# Patient Record
Sex: Female | Born: 1970
Health system: Southern US, Community
[De-identification: ages and names within clinical notes are randomized; demographics above are authoritative.]

## PROBLEM LIST (undated history)

## (undated) DIAGNOSIS — E119 Type 2 diabetes mellitus without complications: Secondary | ICD-10-CM

## (undated) DIAGNOSIS — K219 Gastro-esophageal reflux disease without esophagitis: Secondary | ICD-10-CM

## (undated) DIAGNOSIS — E1142 Type 2 diabetes mellitus with diabetic polyneuropathy: Secondary | ICD-10-CM

## (undated) DIAGNOSIS — I10 Essential (primary) hypertension: Secondary | ICD-10-CM

## (undated) DIAGNOSIS — F329 Major depressive disorder, single episode, unspecified: Secondary | ICD-10-CM

## (undated) DIAGNOSIS — F32A Depression, unspecified: Secondary | ICD-10-CM

## (undated) DIAGNOSIS — E669 Obesity, unspecified: Secondary | ICD-10-CM

## (undated) DIAGNOSIS — R000 Tachycardia, unspecified: Secondary | ICD-10-CM

## (undated) DIAGNOSIS — E7800 Pure hypercholesterolemia, unspecified: Secondary | ICD-10-CM

## (undated) DIAGNOSIS — F609 Personality disorder, unspecified: Secondary | ICD-10-CM

## (undated) DIAGNOSIS — R45851 Suicidal ideations: Secondary | ICD-10-CM

---

## 2003-06-20 ENCOUNTER — Emergency Department (HOSPITAL_COMMUNITY): Admission: EM | Admit: 2003-06-20 | Discharge: 2003-06-20 | Payer: Self-pay | Admitting: Emergency Medicine

## 2004-04-03 ENCOUNTER — Inpatient Hospital Stay (HOSPITAL_COMMUNITY): Admission: EM | Admit: 2004-04-03 | Discharge: 2004-04-05 | Payer: Self-pay | Admitting: Emergency Medicine

## 2004-04-05 ENCOUNTER — Inpatient Hospital Stay (HOSPITAL_COMMUNITY): Admission: RE | Admit: 2004-04-05 | Discharge: 2004-04-09 | Payer: Self-pay | Admitting: Psychiatry

## 2004-04-10 ENCOUNTER — Other Ambulatory Visit (HOSPITAL_COMMUNITY): Admission: RE | Admit: 2004-04-10 | Discharge: 2004-04-10 | Payer: Self-pay | Admitting: Psychiatry

## 2004-09-28 ENCOUNTER — Ambulatory Visit: Payer: Self-pay | Admitting: Internal Medicine

## 2004-10-10 ENCOUNTER — Ambulatory Visit: Payer: Self-pay | Admitting: Unknown Physician Specialty

## 2005-02-07 ENCOUNTER — Emergency Department (HOSPITAL_COMMUNITY): Admission: EM | Admit: 2005-02-07 | Discharge: 2005-02-07 | Payer: Self-pay | Admitting: Emergency Medicine

## 2005-05-31 ENCOUNTER — Emergency Department: Payer: Self-pay | Admitting: Emergency Medicine

## 2005-06-21 ENCOUNTER — Emergency Department (HOSPITAL_COMMUNITY): Admission: EM | Admit: 2005-06-21 | Discharge: 2005-06-22 | Payer: Self-pay | Admitting: Emergency Medicine

## 2005-07-13 ENCOUNTER — Inpatient Hospital Stay: Payer: Self-pay | Admitting: Unknown Physician Specialty

## 2006-07-28 ENCOUNTER — Ambulatory Visit: Payer: Self-pay | Admitting: Nurse Practitioner

## 2006-08-07 ENCOUNTER — Ambulatory Visit: Payer: Self-pay | Admitting: Nurse Practitioner

## 2006-09-01 ENCOUNTER — Ambulatory Visit (HOSPITAL_COMMUNITY): Admission: RE | Admit: 2006-09-01 | Discharge: 2006-09-01 | Payer: Self-pay | Admitting: Family Medicine

## 2006-12-30 ENCOUNTER — Ambulatory Visit: Payer: Self-pay | Admitting: Gastroenterology

## 2007-05-12 ENCOUNTER — Inpatient Hospital Stay: Payer: Self-pay | Admitting: Unknown Physician Specialty

## 2007-05-15 ENCOUNTER — Other Ambulatory Visit: Payer: Self-pay

## 2007-09-09 DIAGNOSIS — I10 Essential (primary) hypertension: Secondary | ICD-10-CM | POA: Insufficient documentation

## 2007-09-09 DIAGNOSIS — F3289 Other specified depressive episodes: Secondary | ICD-10-CM | POA: Insufficient documentation

## 2007-09-09 DIAGNOSIS — F329 Major depressive disorder, single episode, unspecified: Secondary | ICD-10-CM | POA: Insufficient documentation

## 2007-09-09 DIAGNOSIS — K219 Gastro-esophageal reflux disease without esophagitis: Secondary | ICD-10-CM | POA: Insufficient documentation

## 2007-09-09 DIAGNOSIS — G609 Hereditary and idiopathic neuropathy, unspecified: Secondary | ICD-10-CM | POA: Insufficient documentation

## 2007-09-09 DIAGNOSIS — E119 Type 2 diabetes mellitus without complications: Secondary | ICD-10-CM | POA: Insufficient documentation

## 2007-09-25 ENCOUNTER — Ambulatory Visit: Payer: Self-pay | Admitting: Family Medicine

## 2007-11-26 ENCOUNTER — Ambulatory Visit: Payer: Self-pay | Admitting: Pain Medicine

## 2008-02-26 ENCOUNTER — Inpatient Hospital Stay: Payer: Self-pay | Admitting: Unknown Physician Specialty

## 2008-04-06 ENCOUNTER — Other Ambulatory Visit: Payer: Self-pay

## 2008-04-06 ENCOUNTER — Inpatient Hospital Stay: Payer: Self-pay | Admitting: Unknown Physician Specialty

## 2008-05-03 ENCOUNTER — Ambulatory Visit: Payer: Self-pay | Admitting: Unknown Physician Specialty

## 2008-05-11 ENCOUNTER — Ambulatory Visit: Payer: Self-pay | Admitting: Unknown Physician Specialty

## 2008-06-13 ENCOUNTER — Ambulatory Visit: Payer: Self-pay | Admitting: Unknown Physician Specialty

## 2008-07-12 ENCOUNTER — Ambulatory Visit: Payer: Self-pay | Admitting: Unknown Physician Specialty

## 2008-08-11 ENCOUNTER — Ambulatory Visit: Payer: Self-pay | Admitting: Unknown Physician Specialty

## 2008-09-11 ENCOUNTER — Ambulatory Visit: Payer: Self-pay | Admitting: Unknown Physician Specialty

## 2008-10-11 ENCOUNTER — Ambulatory Visit: Payer: Self-pay | Admitting: Unknown Physician Specialty

## 2008-11-11 ENCOUNTER — Ambulatory Visit: Payer: Self-pay | Admitting: Unknown Physician Specialty

## 2008-11-15 ENCOUNTER — Ambulatory Visit: Payer: Self-pay | Admitting: Family Medicine

## 2008-11-25 ENCOUNTER — Ambulatory Visit: Payer: Self-pay | Admitting: Unknown Physician Specialty

## 2008-12-12 ENCOUNTER — Ambulatory Visit: Payer: Self-pay | Admitting: Unknown Physician Specialty

## 2008-12-12 ENCOUNTER — Ambulatory Visit: Payer: Self-pay | Admitting: Family Medicine

## 2009-02-17 ENCOUNTER — Ambulatory Visit: Payer: Self-pay | Admitting: Unknown Physician Specialty

## 2009-03-09 ENCOUNTER — Inpatient Hospital Stay: Payer: Self-pay | Admitting: Psychiatry

## 2009-03-14 ENCOUNTER — Ambulatory Visit: Payer: Self-pay | Admitting: Unknown Physician Specialty

## 2009-04-11 ENCOUNTER — Ambulatory Visit: Payer: Self-pay | Admitting: Family Medicine

## 2009-04-21 ENCOUNTER — Ambulatory Visit: Payer: Self-pay | Admitting: Family Medicine

## 2009-05-11 ENCOUNTER — Ambulatory Visit: Payer: Self-pay | Admitting: Unknown Physician Specialty

## 2009-07-12 ENCOUNTER — Ambulatory Visit: Payer: Self-pay | Admitting: Unknown Physician Specialty

## 2009-09-30 ENCOUNTER — Emergency Department: Payer: Self-pay | Admitting: Emergency Medicine

## 2009-10-11 ENCOUNTER — Ambulatory Visit: Payer: Self-pay | Admitting: Unknown Physician Specialty

## 2009-12-22 ENCOUNTER — Ambulatory Visit: Payer: Self-pay | Admitting: Unknown Physician Specialty

## 2010-01-08 ENCOUNTER — Inpatient Hospital Stay: Payer: Self-pay | Admitting: Unknown Physician Specialty

## 2010-01-19 ENCOUNTER — Ambulatory Visit: Payer: Self-pay | Admitting: Unknown Physician Specialty

## 2010-02-09 ENCOUNTER — Ambulatory Visit: Payer: Self-pay | Admitting: Unknown Physician Specialty

## 2010-03-13 ENCOUNTER — Ambulatory Visit: Payer: Self-pay | Admitting: Unknown Physician Specialty

## 2010-04-11 ENCOUNTER — Ambulatory Visit: Payer: Self-pay | Admitting: Unknown Physician Specialty

## 2010-06-11 ENCOUNTER — Ambulatory Visit: Payer: Self-pay | Admitting: Unknown Physician Specialty

## 2010-07-12 ENCOUNTER — Ambulatory Visit: Payer: Self-pay | Admitting: Unknown Physician Specialty

## 2010-10-08 ENCOUNTER — Ambulatory Visit: Payer: Self-pay | Admitting: Unknown Physician Specialty

## 2010-10-11 ENCOUNTER — Ambulatory Visit: Payer: Self-pay | Admitting: Unknown Physician Specialty

## 2010-12-12 ENCOUNTER — Ambulatory Visit: Payer: Self-pay | Admitting: Unknown Physician Specialty

## 2011-03-12 ENCOUNTER — Ambulatory Visit: Payer: Self-pay | Admitting: Unknown Physician Specialty

## 2011-03-17 ENCOUNTER — Emergency Department: Payer: Self-pay | Admitting: Emergency Medicine

## 2011-06-12 ENCOUNTER — Ambulatory Visit: Payer: Self-pay | Admitting: Unknown Physician Specialty

## 2011-08-12 ENCOUNTER — Ambulatory Visit: Payer: Self-pay | Admitting: Unknown Physician Specialty

## 2011-09-12 ENCOUNTER — Ambulatory Visit: Payer: Self-pay | Admitting: Unknown Physician Specialty

## 2011-10-14 ENCOUNTER — Ambulatory Visit: Payer: Self-pay | Admitting: Unknown Physician Specialty

## 2011-11-13 ENCOUNTER — Ambulatory Visit: Payer: Self-pay | Admitting: Unknown Physician Specialty

## 2011-12-13 ENCOUNTER — Ambulatory Visit: Payer: Self-pay | Admitting: Unknown Physician Specialty

## 2012-01-10 ENCOUNTER — Ambulatory Visit: Payer: Self-pay | Admitting: Unknown Physician Specialty

## 2012-02-10 ENCOUNTER — Ambulatory Visit: Payer: Self-pay | Admitting: Unknown Physician Specialty

## 2012-03-11 ENCOUNTER — Ambulatory Visit: Payer: Self-pay | Admitting: Unknown Physician Specialty

## 2012-04-11 ENCOUNTER — Ambulatory Visit: Payer: Self-pay | Admitting: Unknown Physician Specialty

## 2012-05-11 ENCOUNTER — Ambulatory Visit: Payer: Self-pay | Admitting: Psychiatry

## 2012-06-11 ENCOUNTER — Ambulatory Visit: Payer: Self-pay | Admitting: Psychiatry

## 2012-07-15 ENCOUNTER — Ambulatory Visit: Payer: Self-pay | Admitting: Unknown Physician Specialty

## 2012-08-11 ENCOUNTER — Ambulatory Visit: Payer: Self-pay | Admitting: Psychiatry

## 2012-09-11 ENCOUNTER — Ambulatory Visit: Payer: Self-pay | Admitting: Psychiatry

## 2012-10-11 ENCOUNTER — Ambulatory Visit: Payer: Self-pay | Admitting: Psychiatry

## 2012-11-11 ENCOUNTER — Ambulatory Visit: Payer: Self-pay | Admitting: Psychiatry

## 2012-12-14 ENCOUNTER — Ambulatory Visit: Payer: Self-pay | Admitting: Psychiatry

## 2013-01-10 ENCOUNTER — Ambulatory Visit: Payer: Self-pay | Admitting: Psychiatry

## 2013-02-10 ENCOUNTER — Ambulatory Visit: Payer: Self-pay | Admitting: Psychiatry

## 2013-03-11 ENCOUNTER — Ambulatory Visit: Payer: Self-pay | Admitting: Psychiatry

## 2013-04-11 ENCOUNTER — Ambulatory Visit: Payer: Self-pay | Admitting: Psychiatry

## 2013-05-07 ENCOUNTER — Inpatient Hospital Stay: Payer: Self-pay | Admitting: Psychiatry

## 2013-05-08 LAB — BEHAVIORAL MEDICINE 1 PANEL
Albumin: 3.2 g/dL — ABNORMAL LOW (ref 3.4–5.0)
Alkaline Phosphatase: 97 U/L (ref 50–136)
Anion Gap: 3 — ABNORMAL LOW (ref 7–16)
BUN: 7 mg/dL (ref 7–18)
Basophil #: 0 10*3/uL (ref 0.0–0.1)
Basophil %: 0.4 %
Bilirubin,Total: 0.3 mg/dL (ref 0.2–1.0)
Calcium, Total: 8.8 mg/dL (ref 8.5–10.1)
Chloride: 108 mmol/L — ABNORMAL HIGH (ref 98–107)
Co2: 28 mmol/L (ref 21–32)
Creatinine: 0.77 mg/dL (ref 0.60–1.30)
EGFR (African American): >60
EGFR (Non-African Amer.): >60
Eosinophil #: 0.1 10*3/uL (ref 0.0–0.7)
Eosinophil %: 1 %
Glucose: 102 mg/dL — ABNORMAL HIGH (ref 65–99)
HCT: 34.5 % — ABNORMAL LOW (ref 35.0–47.0)
HGB: 11.3 g/dL — ABNORMAL LOW (ref 12.0–16.0)
Lymphocyte #: 2.9 10*3/uL (ref 1.0–3.6)
Lymphocyte %: 36.2 %
MCH: 26.2 pg (ref 26.0–34.0)
MCHC: 32.6 g/dL (ref 32.0–36.0)
MCV: 80 fL (ref 80–100)
Monocyte #: 0.5 x10 3/mm (ref 0.2–0.9)
Monocyte %: 6.9 %
Neutrophil #: 4.4 10*3/uL (ref 1.4–6.5)
Neutrophil %: 55.5 %
Osmolality: 276 (ref 275–301)
Platelet: 271 10*3/uL (ref 150–440)
Potassium: 3.8 mmol/L (ref 3.5–5.1)
RBC: 4.29 10*6/uL (ref 3.80–5.20)
RDW: 14.2 % (ref 11.5–14.5)
SGOT(AST): 14 U/L — ABNORMAL LOW (ref 15–37)
SGPT (ALT): 14 U/L (ref 12–78)
Sodium: 139 mmol/L (ref 136–145)
Thyroid Stimulating Horm: 2.15 u[IU]/mL
Total Protein: 6.7 g/dL (ref 6.4–8.2)
WBC: 7.9 10*3/uL (ref 3.6–11.0)

## 2013-05-08 LAB — URINALYSIS, COMPLETE
Bilirubin,UR: NEGATIVE
Blood: NEGATIVE
Glucose,UR: NEGATIVE mg/dL (ref 0–75)
Ketone: NEGATIVE
Nitrite: NEGATIVE
Ph: 6 (ref 4.5–8.0)
Protein: NEGATIVE
RBC,UR: <1 /HPF (ref 0–5)
Specific Gravity: 1.006 (ref 1.003–1.030)
Squamous Epithelial: 4
WBC UR: 1 /HPF (ref 0–5)

## 2013-05-11 ENCOUNTER — Ambulatory Visit: Payer: Self-pay | Admitting: Psychiatry

## 2013-06-11 ENCOUNTER — Ambulatory Visit: Payer: Self-pay | Admitting: Psychiatry

## 2013-07-12 ENCOUNTER — Ambulatory Visit: Payer: Self-pay | Admitting: Psychiatry

## 2013-08-11 ENCOUNTER — Ambulatory Visit: Payer: Self-pay | Admitting: Psychiatry

## 2013-09-11 ENCOUNTER — Ambulatory Visit: Payer: Self-pay | Admitting: Psychiatry

## 2013-09-12 ENCOUNTER — Ambulatory Visit: Payer: Self-pay | Admitting: Psychiatry

## 2013-10-11 ENCOUNTER — Ambulatory Visit: Payer: Self-pay | Admitting: Psychiatry

## 2013-11-11 ENCOUNTER — Ambulatory Visit: Payer: Self-pay | Admitting: Psychiatry

## 2013-12-13 ENCOUNTER — Ambulatory Visit: Payer: Self-pay | Admitting: Psychiatry

## 2014-01-09 ENCOUNTER — Ambulatory Visit: Payer: Self-pay | Admitting: Psychiatry

## 2014-02-09 ENCOUNTER — Ambulatory Visit: Payer: Self-pay | Admitting: Psychiatry

## 2014-03-11 ENCOUNTER — Ambulatory Visit: Payer: Self-pay | Admitting: Psychiatry

## 2014-04-11 ENCOUNTER — Ambulatory Visit: Payer: Self-pay | Admitting: Psychiatry

## 2014-05-12 ENCOUNTER — Ambulatory Visit: Payer: Self-pay | Admitting: Psychiatry

## 2014-06-11 ENCOUNTER — Ambulatory Visit: Payer: Self-pay | Admitting: Psychiatry

## 2014-07-12 ENCOUNTER — Ambulatory Visit: Payer: Self-pay | Admitting: Psychiatry

## 2014-08-12 ENCOUNTER — Ambulatory Visit: Payer: Self-pay | Admitting: Psychiatry

## 2014-09-12 ENCOUNTER — Ambulatory Visit: Payer: Self-pay | Admitting: Psychiatry

## 2014-10-11 ENCOUNTER — Ambulatory Visit: Payer: Self-pay | Admitting: Psychiatry

## 2014-11-14 ENCOUNTER — Ambulatory Visit: Payer: Self-pay | Admitting: Psychiatry

## 2014-11-18 DIAGNOSIS — F329 Major depressive disorder, single episode, unspecified: Secondary | ICD-10-CM | POA: Diagnosis not present

## 2014-11-25 DIAGNOSIS — F329 Major depressive disorder, single episode, unspecified: Secondary | ICD-10-CM | POA: Diagnosis not present

## 2014-11-30 DIAGNOSIS — F329 Major depressive disorder, single episode, unspecified: Secondary | ICD-10-CM | POA: Diagnosis not present

## 2014-12-08 ENCOUNTER — Ambulatory Visit: Payer: Self-pay | Admitting: Psychiatry

## 2014-12-09 DIAGNOSIS — F329 Major depressive disorder, single episode, unspecified: Secondary | ICD-10-CM | POA: Diagnosis not present

## 2014-12-12 ENCOUNTER — Ambulatory Visit: Payer: Self-pay | Admitting: Psychiatry

## 2014-12-16 DIAGNOSIS — F329 Major depressive disorder, single episode, unspecified: Secondary | ICD-10-CM | POA: Diagnosis not present

## 2014-12-23 DIAGNOSIS — F329 Major depressive disorder, single episode, unspecified: Secondary | ICD-10-CM | POA: Diagnosis not present

## 2014-12-30 DIAGNOSIS — F329 Major depressive disorder, single episode, unspecified: Secondary | ICD-10-CM | POA: Diagnosis not present

## 2015-01-06 DIAGNOSIS — F329 Major depressive disorder, single episode, unspecified: Secondary | ICD-10-CM | POA: Diagnosis not present

## 2015-01-10 ENCOUNTER — Ambulatory Visit
Admit: 2015-01-10 | Disposition: A | Discharge status: Home / Self Care | Payer: Self-pay | Attending: Psychiatry | Admitting: Psychiatry

## 2015-01-13 DIAGNOSIS — F339 Major depressive disorder, recurrent, unspecified: Secondary | ICD-10-CM | POA: Diagnosis not present

## 2015-01-20 DIAGNOSIS — F339 Major depressive disorder, recurrent, unspecified: Secondary | ICD-10-CM | POA: Diagnosis not present

## 2015-01-27 DIAGNOSIS — F339 Major depressive disorder, recurrent, unspecified: Secondary | ICD-10-CM | POA: Diagnosis not present

## 2015-02-03 DIAGNOSIS — F339 Major depressive disorder, recurrent, unspecified: Secondary | ICD-10-CM | POA: Diagnosis not present

## 2015-02-10 ENCOUNTER — Ambulatory Visit
Admit: 2015-02-10 | Disposition: A | Discharge status: Home / Self Care | Payer: Self-pay | Attending: Psychiatry | Admitting: Psychiatry

## 2015-02-10 DIAGNOSIS — F329 Major depressive disorder, single episode, unspecified: Secondary | ICD-10-CM | POA: Diagnosis not present

## 2015-02-10 HISTORY — DX: Essential (primary) hypertension: I10

## 2015-02-10 HISTORY — DX: Suicidal ideations: R45.851

## 2015-02-10 HISTORY — DX: Obesity, unspecified: E66.9

## 2015-02-10 HISTORY — DX: Type 2 diabetes mellitus without complications: E11.9

## 2015-02-10 HISTORY — DX: Tachycardia, unspecified: R00.0

## 2015-02-10 HISTORY — DX: Depression, unspecified: F32.A

## 2015-02-10 HISTORY — DX: Type 2 diabetes mellitus with diabetic polyneuropathy: E11.42

## 2015-02-10 HISTORY — DX: Gastro-esophageal reflux disease without esophagitis: K21.9

## 2015-02-10 HISTORY — DX: Personality disorder, unspecified: F60.9

## 2015-02-10 HISTORY — DX: Pure hypercholesterolemia, unspecified: E78.00

## 2015-02-10 HISTORY — DX: Major depressive disorder, single episode, unspecified: F32.9

## 2015-02-17 DIAGNOSIS — F329 Major depressive disorder, single episode, unspecified: Secondary | ICD-10-CM | POA: Diagnosis not present

## 2015-02-24 DIAGNOSIS — F329 Major depressive disorder, single episode, unspecified: Secondary | ICD-10-CM | POA: Diagnosis not present

## 2015-03-03 DIAGNOSIS — F329 Major depressive disorder, single episode, unspecified: Secondary | ICD-10-CM | POA: Diagnosis not present

## 2015-03-03 NOTE — Discharge Summary (Signed)
PATIENT NAME:  Melinda [name], Melinda [name] MR#:  [id number] DATE OF BIRTH:  [birth year]  DATE OF ADMISSION:  05/07/2013 DATE OF DISCHARGE:  05/10/2013  HOSPITAL COURSE:  See dictated history and physical for details of admission.  This 44 year old woman with a history of chronic recurrent depression was admitted to the hospital on Friday after ECT.  She was showing symptoms of dramatic worsening of her depression.  She was having active suicidal thoughts and thoughts about cutting herself.  The patient was not able to contract for safety.  She was voluntarily admitted to the hospital.  In the hospital, she was cooperative with treatment.  She was continued on her usual medication.  She did not display any dangerous behavior, did not show any suicidal behavior.  Did not injure herself.  At the time of discharge we did another ECT treatment on Monday and the patient reported that her mood was feeling much better.  Although she remains chronically depressed and anxious, she is not actively suicidal and she agrees to outpatient treatment.  She was discharged with the plan that she will return within a week for another ECT treatment and meanwhile continue her usual medication as prescribed by her outpatient psychiatrist.   DISCHARGE MEDICATIONS:  Neurontin 800 mg 3 times a day, Wellbutrin XL 300 mg a day, glipizide 10 mg twice a day, Actos 30 mg once a day, Januvia 100 mg a day, atorvastatin 10 mg once a day, ziprasidone 80 mg twice a day and Viibryd 40 mg per day.   LABORATORY RESULTS:  Chemistries done early in her admission showed her CBC with a slight anemia with a hemoglobin of 11.3.  Chemistries with the glucose slightly elevated at 102, chloride 108, AST low at 14.  No other remarkable findings.   MENTAL STATUS EXAMINATION AT DISCHARGE:  Neatly dressed and groomed woman, looks her stated age.  Cooperative with the interview.  Good eye contact, normal psychomotor activity.  Speech normal rate, tone and volume.   Affect euthymic, reactive, appropriate.  Mood stated as being better.  Thoughts appear to be lucid.  No evidence of loosening of associations or delusions.  Denies auditory or visual hallucinations.  Denies suicidal or homicidal ideation.  Shows improved judgment and insight.  Normal intelligence.   DISPOSITION:  Discharge home.  Follow up with her outpatient psychiatrist and ECT.   DISCHARGE DIAGNOSIS, PRINCIPAL AND PRIMARY:  AXIS I:  Major depression, severe, recurrent.   SECONDARY DIAGNOSES: AXIS I:  No further.  AXIS II:  Deferred.  AXIS III:  Diabetes, obesity.  AXIS IV:  Severe from chronic impairment from her illness, lack of support.  AXIS V:  Functioning at time of discharge 84.    ____________________________ Gonzella Lex, MD jtc:ea D: 05/12/2013 16:34:42 ET T: 05/12/2013 23:36:05 ET JOB#: [id number]  cc: Gonzella Lex, MD, <Dictator> Gonzella Lex MD ELECTRONICALLY SIGNED 05/13/2013 10:47

## 2015-03-03 NOTE — H&P (Signed)
PATIENT NAME:  Melinda [name], Melinda [name] MR#:  [id number] DATE OF BIRTH:  Apr 27, 1971  DATE OF ADMISSION:  05/07/2013  IDENTIFYING INFORMATION AND CHIEF COMPLAINT:  A 44 year old woman with a history of major depression who is admitted today from the ECT service.   CHIEF COMPLAINT:  "Maybe I better stay."   HISTORY OF PRESENT ILLNESS:  Information obtained from the patient and the chart.  She presented today for scheduled ECT treatment reporting that she was much sadder than usual.  She had been crying more.  Spending all of her time in bed.  Very little to no activity.  She is having more thoughts about cutting herself and has been doing more cutting in the last few days.  She does not report any hallucinations.  She has been compliant with medicine.  The patient had previously been getting regular ECT treatments as frequently as once a week, but has gone three weeks since her last treatment due to my vacation.  There is no other particular new stress.  She did report that she was upset that her computer was broken and that she did not have money to fix it.  Also that she continues to have very little money and so her father has to buy her groceries which makes her feel guilty.   PAST PSYCHIATRIC HISTORY:  Multiple previous psychiatric admissions.  Recently has been getting maintenance ECT which has kept her out of the hospital, but still stays depressed and tired even at her most functional.  History of multiple cutting attempts, some of them with suicidal intent, but often just self-mutilation.  Possibility of bipolar disorder has been considered in the past, although as far as I have been able to tell in recent years, there have been no signs of mania, but persistent depression.   SOCIAL HISTORY:  The patient is disabled.  She lives with her adult son and her cat.  The adult son appears to never pay for rent or food and often seems to be taking advantage of his mother.  The patient is dependent on her own  parents to provide extra support for her.  She has very little social contact outside of her home.  Her chief emotional support is from her cat.   PAST MEDICAL HISTORY:  The patient is obese, has diabetes.  Has high blood pressure.  Elevated cholesterol.   SUBSTANCE ABUSE HISTORY:  No known history of alcohol or drug abuse.   CURRENT MEDICATIONS:  Atorvastatin 10 mg at night, Wellbutrin XL 300 mg per day, gabapentin 800 mg 3 times a day, pioglitazone 30 mg per day, ziprasidone 80 mg twice a day, glipizide 10 mg twice a day, Januvia 100 mg per day.  Also takes by Viibryd 40 mg per day.   ALLERGIES:  PREDNISONE.   REVIEW OF SYSTEMS:   Depressed mood, fatigue, thoughts about wanting to cut herself, crying.  She usually stays awake only at night and sleeps during the day.  More hopeless feeling than usual.   MENTAL STATUS EXAMINATION:  Neatly groomed woman, looks her stated age, cooperative with the interview, although slow in her speech and reaction.  Eye contact good.  Psychomotor activity more sluggish than usual.  Speech quiet, decreased in amount.  Affect tearful and dysphoric.  Mood stated as depressed.  Thoughts generally lucid.  No evidence of loosening of associations.  Denies hallucinations.  Positive suicidal thoughts.  No homicidal ideation.  Impaired judgment and insight.  Normal intelligence.   MENTAL STATUS EXAMINATION:  Obese woman who looks her stated age.  Multiple old scars on her arms from cutting as well as new sites where she has been picking her skin.   PHYSICAL EXAMINATION:  HEENT:  Pupils equal and reactive.  NECK AND BACK:  Nontender.  The patient has limited range of motion from obesity.  Normal gait when she can get up.  Strength and reflexes normal and symmetric throughout.  Cranial nerves symmetric and normal.  LUNGS:  Clear without wheezes.  HEART:  Regular rate and rhythm.  ABDOMEN:  Soft, nontender, normal bowel sounds.  VITAL SIGNS:  Currently show temperature  98.3, pulse 68, respirations 20, blood pressure 143/69.   ASSESSMENT: This is a 44 year old woman with severe chronic major depression who had been previously maintained at some level of functionality by weekly ECT, but who has deteriorated, possibly because of an unavoidable break in ECT treatments.  She is tearful, dysphoric, more suicidal, unable to take care of herself safely at home, requires hospitalization for safety.   TREATMENT PLAN:  Admit to psychiatry voluntarily.  Continue current medication.  We will plan on ECT again on Monday.  Engage her in groups and activities on the unit.  Try and work on some cognitive skills to improve her coping.  Review labs when they come back.   DIAGNOSIS, PRINCIPAL AND PRIMARY:  AXIS I:  Major depression, severe, recurrent.   SECONDARY DIAGNOSES: AXIS I:  No further.  AXIS II:  Borderline features.  AXIS III:  Obesity, diabetes, hypercholesterolemia, hypertension.  AXIS IV:  Severe chronic stress from the burden of her illness and lack of social activity.  AXIS V:  Functioning at time of evaluation 30.    ____________________________ Gonzella Lex, MD jtc:ea D: 05/07/2013 20:57:48 ET T: 05/07/2013 21:43:41 ET JOB#: [id number]  cc: Gonzella Lex, MD, <Dictator> Gonzella Lex MD ELECTRONICALLY SIGNED 05/07/2013 23:03

## 2015-03-07 ENCOUNTER — Encounter: Payer: Self-pay | Admitting: Psychiatry

## 2015-03-07 DIAGNOSIS — E669 Obesity, unspecified: Secondary | ICD-10-CM

## 2015-03-07 DIAGNOSIS — R45851 Suicidal ideations: Secondary | ICD-10-CM

## 2015-03-07 DIAGNOSIS — E7800 Pure hypercholesterolemia, unspecified: Secondary | ICD-10-CM

## 2015-03-07 DIAGNOSIS — F609 Personality disorder, unspecified: Secondary | ICD-10-CM

## 2015-03-07 DIAGNOSIS — E1142 Type 2 diabetes mellitus with diabetic polyneuropathy: Secondary | ICD-10-CM

## 2015-03-07 DIAGNOSIS — R000 Tachycardia, unspecified: Secondary | ICD-10-CM

## 2015-03-07 HISTORY — DX: Pure hypercholesterolemia, unspecified: E78.00

## 2015-03-07 HISTORY — DX: Tachycardia, unspecified: R00.0

## 2015-03-07 HISTORY — DX: Suicidal ideations: R45.851

## 2015-03-07 HISTORY — PX: OTHER SURGICAL HISTORY: SHX169

## 2015-03-07 HISTORY — DX: Personality disorder, unspecified: F60.9

## 2015-03-07 HISTORY — DX: Obesity, unspecified: E66.9

## 2015-03-07 HISTORY — DX: Type 2 diabetes mellitus with diabetic polyneuropathy: E11.42

## 2015-03-16 ENCOUNTER — Other Ambulatory Visit: Payer: Self-pay

## 2015-03-17 ENCOUNTER — Encounter: Payer: Self-pay | Admitting: Anesthesiology

## 2015-03-17 ENCOUNTER — Encounter
Admission: RE | Admit: 2015-03-17 | Discharge: 2015-03-17 | Disposition: A | Discharge status: Home / Self Care | Payer: Medicare PPO | Source: Ambulatory Visit | Attending: Psychiatry | Admitting: Psychiatry

## 2015-03-17 DIAGNOSIS — F332 Major depressive disorder, recurrent severe without psychotic features: Secondary | ICD-10-CM | POA: Insufficient documentation

## 2015-03-17 LAB — GLUCOSE, CAPILLARY: Glucose-Capillary: 85 mg/dL (ref 70–99)

## 2015-03-17 LAB — POCT PREGNANCY, URINE: Preg Test, Ur: NEGATIVE

## 2015-03-17 MED ORDER — FENTANYL CITRATE (PF) 100 MCG/2ML IJ SOLN: 25.0000 ug | INTRAMUSCULAR | Status: DC | PRN

## 2015-03-17 MED ORDER — ONDANSETRON HCL 4 MG/2ML IJ SOLN: 4.0000 mg | Freq: Once | INTRAMUSCULAR | Status: AC | PRN

## 2015-03-17 MED ORDER — LABETALOL HCL 5 MG/ML IV SOLN
20.0000 mg | Freq: Once | INTRAVENOUS | Status: AC
  Administered 2015-03-17: 20 mg via INTRAVENOUS

## 2015-03-17 MED ORDER — SODIUM CHLORIDE 0.9 % IV SOLN
250.0000 mL | Freq: Once | INTRAVENOUS | Status: AC
  Administered 2015-03-17: 250 mL via INTRAVENOUS

## 2015-03-17 MED ORDER — SUCCINYLCHOLINE CHLORIDE 20 MG/ML IJ SOLN
100.0000 mg | Freq: Once | INTRAMUSCULAR | Status: AC
  Administered 2015-03-17: 100 mg via INTRAVENOUS

## 2015-03-17 MED ORDER — LIDOCAINE HCL (CARDIAC) 20 MG/ML IV SOLN
4.0000 mg | Freq: Once | INTRAVENOUS | Status: AC
  Administered 2015-03-17: 4 mg via INTRAVENOUS

## 2015-03-17 MED ORDER — METHOHEXITAL 100 MG/10 ML PREMIX SYRINGE
70.0000 mg | Freq: Once | INTRAVENOUS | Status: AC
  Administered 2015-03-17: 70 mg via INTRAVENOUS

## 2015-03-17 MED ORDER — GLYCOPYRROLATE 0.2 MG/ML IJ SOLN
0.4000 mg | Freq: Once | INTRAMUSCULAR | Status: AC
  Administered 2015-03-17: 0.4 mg via INTRAVENOUS

## 2015-03-17 MED ORDER — ESMOLOL HCL 10 MG/ML IV SOLN
10.0000 mg | Freq: Once | INTRAVENOUS | Status: AC
  Administered 2015-03-17: 10 mg via INTRAVENOUS

## 2015-03-17 MED ORDER — KETOROLAC TROMETHAMINE 30 MG/ML IJ SOLN
30.0000 mg | Freq: Once | INTRAMUSCULAR | Status: AC
  Administered 2015-03-17: 30 mg via INTRAVENOUS

## 2015-03-17 NOTE — Anesthesia Postprocedure Evaluation (Signed)
  Anesthesia Post-op Note  Patient: Melinda [name]  Procedure(s) Performed: * No procedures listed *  Anesthesia type:General  Patient location: PACU  Post pain: Pain level controlled  Post assessment: Post-op Vital signs reviewed, Patient's Cardiovascular Status Stable, Respiratory Function Stable, Patent Airway and No signs of Nausea or vomiting  Post vital signs: Reviewed and stable  Last Vitals:  Filed Vitals:   03/17/15 1200  BP: 136/79  Pulse: 88  Temp:   Resp: 24    Level of consciousness: awake, alert  and patient cooperative  Complications: No apparent anesthesia complications

## 2015-03-17 NOTE — Anesthesia Preprocedure Evaluation (Addendum)
Anesthesia Evaluation  Patient identified by MRN, date of birth, ID band Patient awake    Reviewed: Allergy & Precautions, NPO status , Patient's Chart, lab work & pertinent test results  History of Anesthesia Complications (+) MALIGNANT HYPERTHERMIA  Airway Mallampati: III  TM Distance: >3 FB Neck ROM: Full    Dental  (+) Chipped   Pulmonary          Cardiovascular hypertension, Pt. on medications +CHF     Neuro/Psych PSYCHIATRIC DISORDERS Depression  Neuromuscular disease    GI/Hepatic GERD-  ,  Endo/Other  diabetes, Type 2Morbid obesity  Renal/GU      Musculoskeletal   Abdominal   Peds  Hematology   Anesthesia Other Findings   Reproductive/Obstetrics                           Anesthesia Physical Anesthesia Plan  ASA: III  Anesthesia Plan: General   Post-op Pain Management:    Induction: Intravenous  Airway Management Planned: Mask  Additional Equipment:   Intra-op Plan:   Post-operative Plan:   Informed Consent:   Plan Discussed with: CRNA and Surgeon  Anesthesia Plan Comments:        Anesthesia Quick Evaluation

## 2015-03-17 NOTE — Anesthesia Procedure Notes (Signed)
Performed by: Demetrius Charity Pre-anesthesia Checklist: Patient identified, Emergency Drugs available, Timeout performed, Suction available and Patient being monitored Patient Re-evaluated:Patient Re-evaluated prior to inductionOxygen Delivery Method: Circle system utilized Preoxygenation: Pre-oxygenation with 100% oxygen Intubation Type: IV induction Ventilation: Mask ventilation without difficulty Airway Equipment and Method: Bite block

## 2015-03-17 NOTE — Transfer of Care (Signed)
Immediate Anesthesia Transfer of Care Note  Patient: [name]  Procedure(s) Performed: * No procedures listed *  Patient Location: PACU  Anesthesia Type:General  Level of Consciousness: awake, alert  and oriented  Airway & Oxygen Therapy: Patient Spontanous Breathing and Patient connected to face mask oxygen  Post-op Assessment: Report given to RN and Post -op Vital signs reviewed and stable  Post vital signs: Reviewed and stable  Last Vitals:  Filed Vitals:   03/17/15 0947  BP: 116/70  Pulse: 84  Temp: 36.8 C  Resp: 18    Complications: No apparent anesthesia complications

## 2015-03-17 NOTE — Procedures (Signed)
  ECT SERVICES Physician's Interval Evaluation & Treatment Note  Patient Identification: Melinda [name] MRN:  [id number] Date of Evaluation:  03/17/2015 TX #: [id number] MADRS:  MMSE:  P.E. Findings:  Patient reports no change to her physical condition. No change to physical exam from previous exam  Psychiatric Interval Note:  Mood's been stable. No return of severe depression. No self-mutilation.  Subjective:  Patient is a 44 y.o. female seen for evaluation for Electroconvulsive Therapy. Patient had bilateral treatment single seizure. Tolerated well. No complaints.  Treatment Summary:   []   Right Unilateral             [x]  Bilateral     % Energy Impedance Seizure Energy Index Postictal Suppression Index Seizure Concordance Index Medications Seizure Duration                 Pre-Shock    Post-Shock          Comments: Energy was 1.0 ms 35%, impedance 1230, seizure energy 7208, postictal suppression 91%, seizure concordance 96%. Pretreatment medications Robinul 0.2 mg, Xylocaine 4 mg, labetalol 20 mg, Toradol 30 mg, Brevital 70 mg, esmolol 10 mg, succinylcholine 100 mg. Seizure duration by EMG 35 seconds by EEG 70 seconds. Plan is for follow-up treatment next week on her usual schedule. Patient agrees plan. Lungs:  [x]   Clear to auscultation               []  Other:   Heart:    [x]   Regular rhythm             []  irregular rhythm    [x]   Previous H&P reviewed, patient examined and there are NO CHANGES                 []   Previous H&P reviewed, patient examined and there are changes noted.   [name], MD 5/6/201612:07 PM

## 2015-03-22 ENCOUNTER — Other Ambulatory Visit: Payer: Self-pay

## 2015-03-24 ENCOUNTER — Encounter: Payer: Self-pay | Admitting: Anesthesiology

## 2015-03-24 ENCOUNTER — Encounter
Admission: RE | Admit: 2015-03-24 | Discharge: 2015-03-24 | Disposition: A | Discharge status: Home / Self Care | Payer: Medicare PPO | Source: Ambulatory Visit | Attending: Psychiatry | Admitting: Psychiatry

## 2015-03-24 DIAGNOSIS — F339 Major depressive disorder, recurrent, unspecified: Secondary | ICD-10-CM | POA: Diagnosis not present

## 2015-03-24 DIAGNOSIS — F332 Major depressive disorder, recurrent severe without psychotic features: Secondary | ICD-10-CM | POA: Diagnosis not present

## 2015-03-24 LAB — GLUCOSE, CAPILLARY: Glucose-Capillary: 95 mg/dL (ref 65–99)

## 2015-03-24 LAB — POCT PREGNANCY, URINE: Preg Test, Ur: NEGATIVE

## 2015-03-24 MED ORDER — KETOROLAC TROMETHAMINE 30 MG/ML IJ SOLN
30.0000 mg | Freq: Once | INTRAMUSCULAR | Status: AC
  Administered 2015-03-24: 30 mg via INTRAVENOUS

## 2015-03-24 MED ORDER — SUCCINYLCHOLINE CHLORIDE 20 MG/ML IJ SOLN
100.0000 mg | Freq: Once | INTRAMUSCULAR | Status: AC
  Administered 2015-03-24: 100 mg via INTRAVENOUS

## 2015-03-24 MED ORDER — SODIUM CHLORIDE 0.9 % IV SOLN
250.0000 mL | Freq: Once | INTRAVENOUS | Status: AC
  Administered 2015-03-24: 250 mL via INTRAVENOUS

## 2015-03-24 MED ORDER — METHOHEXITAL 100 MG/10 ML PREMIX SYRINGE
70.0000 mg | Freq: Once | INTRAVENOUS | Status: AC
  Administered 2015-03-24: 70 mg via INTRAVENOUS

## 2015-03-24 MED ORDER — LABETALOL HCL 5 MG/ML IV SOLN
20.0000 mg | Freq: Once | INTRAVENOUS | Status: AC
  Administered 2015-03-24: 20 mg via INTRAVENOUS

## 2015-03-24 MED ORDER — LIDOCAINE HCL (CARDIAC) 20 MG/ML IV SOLN
4.0000 mg | Freq: Once | INTRAVENOUS | Status: AC
  Administered 2015-03-24: 4 mg via INTRAVENOUS

## 2015-03-24 MED ORDER — ESMOLOL HCL-SODIUM CHLORIDE 2000 MG/100ML IV SOLN
10.0000 mg | Freq: Once | INTRAVENOUS | Status: AC
  Administered 2015-03-24: 10000 ug via INTRAVENOUS

## 2015-03-24 MED ORDER — GLYCOPYRROLATE 0.2 MG/ML IJ SOLN
0.4000 mg | Freq: Once | INTRAMUSCULAR | Status: AC
  Administered 2015-03-24: 0.4 mg via INTRAVENOUS

## 2015-03-24 NOTE — Anesthesia Postprocedure Evaluation (Signed)
  Anesthesia Post-op Note  Patient: Melinda [name]  Procedure(s) Performed: ECT  Anesthesia type:General  Patient location: PACU  Post pain: Pain level controlled  Post assessment: Post-op Vital signs reviewed, Patient's Cardiovascular Status Stable, Respiratory Function Stable, Patent Airway and No signs of Nausea or vomiting  Post vital signs: Reviewed and stable  Last Vitals:  Filed Vitals:   03/24/15 1225  BP: 135/101  Pulse: 93  Temp: 37.1 C  Resp: 20    Level of consciousness: awake, alert  and patient cooperative  Complications: No apparent anesthesia complications

## 2015-03-24 NOTE — Anesthesia Procedure Notes (Signed)
Date/Time: 03/24/2015 12:12 PM Performed by: Silvana Newness Airway Equipment and Method: Bite block

## 2015-03-24 NOTE — Procedures (Signed)
ECT SERVICES Physician's Interval Evaluation & Treatment Note  Patient Identification: SHANTI AGRESTI MRN:  [id number] Date of Evaluation:  03/24/2015 TX #: [id number]  MADRS:   MMSE:   P.E. Findings:  Maintenance treatment  Psychiatric Interval Note:  Mood stable  Subjective:  Patient is a 44 y.o. female seen for evaluation for Electroconvulsive Therapy. No new physical complaint or finding  Treatment Summary:   []   Right Unilateral             [x]  Bilateral   % Energy : 1.26ms, 35%   Impedance: 1380  Seizure Energy Index: 3228  Postictal Suppression Index: 41%  Seizure Concordance Index: 94%  Medications  Pre Shock: robi 0.4mg ,xyl 4mg , tor 30mg . Lab 20mg , brev 70mg , esmolol 10mg , suc 100mg   Post Shock:   Seizure Duration: 37/64s   Comments: Follow up next week   Lungs:  [x]   Clear to auscultation               []  Other:   Heart:    [x]   Regular rhythm             []  irregular rhythm    [x]   Previous H&P reviewed, patient examined and there are NO CHANGES                 []   Previous H&P reviewed, patient examined and there are changes noted.   [name], MD 5/13/201612:01 PM

## 2015-03-24 NOTE — Addendum Note (Signed)
Addendum  created 03/24/15 1312 by Silvana Newness, CRNA   Modules edited: Notes Section   Notes Section:  File: [id number]

## 2015-03-24 NOTE — Anesthesia Preprocedure Evaluation (Signed)
Anesthesia Evaluation  Patient identified by MRN, date of birth, ID band Patient awake    Reviewed: Allergy & Precautions, NPO status , Patient's Chart, lab work & pertinent test results  History of Anesthesia Complications (+) MALIGNANT HYPERTHERMIA  Airway Mallampati: III  TM Distance: >3 FB Neck ROM: Full    Dental  (+) Chipped   Pulmonary          Cardiovascular hypertension, Pt. on medications +CHF     Neuro/Psych PSYCHIATRIC DISORDERS Depression  Neuromuscular disease    GI/Hepatic GERD-  ,  Endo/Other  diabetes, Type 2Morbid obesity  Renal/GU      Musculoskeletal   Abdominal   Peds  Hematology   Anesthesia Other Findings   Reproductive/Obstetrics                             Anesthesia Physical Anesthesia Plan  ASA: III  Anesthesia Plan: General   Post-op Pain Management:    Induction: Intravenous  Airway Management Planned: Mask  Additional Equipment:   Intra-op Plan:   Post-operative Plan:   Informed Consent: I have reviewed the patients History and Physical, chart, labs and discussed the procedure including the risks, benefits and alternatives for the proposed anesthesia with the patient or authorized representative who has indicated his/her understanding and acceptance.     Plan Discussed with:   Anesthesia Plan Comments:         Anesthesia Quick Evaluation

## 2015-03-24 NOTE — Transfer of Care (Addendum)
Immediate Anesthesia Transfer of Care Note  Patient: Melinda [name]  Procedure(s) Performed: ECT  Patient Location: PACU  Anesthesia Type:General  Level of Consciousness: awake, alert , oriented and patient cooperative  Airway & Oxygen Therapy: Patient Spontanous Breathing  Post-op Assessment: Report given to RN, Post -op Vital signs reviewed and stable and Patient moving all extremities X 4  Post vital signs: Reviewed and stable  Last Vitals:  Filed Vitals:   03/24/15 1225  BP: 135/101  Pulse: 93  Temp: 37.1 C  Resp: 20    Complications: No apparent anesthesia complications

## 2015-03-29 ENCOUNTER — Other Ambulatory Visit: Payer: Self-pay

## 2015-03-31 ENCOUNTER — Encounter: Payer: Self-pay | Admitting: Anesthesiology

## 2015-03-31 ENCOUNTER — Encounter
Admission: RE | Admit: 2015-03-31 | Discharge: 2015-03-31 | Disposition: A | Discharge status: Home / Self Care | Payer: Medicare PPO | Source: Ambulatory Visit | Attending: Psychiatry | Admitting: Psychiatry

## 2015-03-31 DIAGNOSIS — F332 Major depressive disorder, recurrent severe without psychotic features: Secondary | ICD-10-CM | POA: Diagnosis not present

## 2015-03-31 DIAGNOSIS — F339 Major depressive disorder, recurrent, unspecified: Secondary | ICD-10-CM | POA: Diagnosis not present

## 2015-03-31 LAB — GLUCOSE, CAPILLARY: Glucose-Capillary: 78 mg/dL (ref 65–99)

## 2015-03-31 LAB — POCT PREGNANCY, URINE: Preg Test, Ur: NEGATIVE

## 2015-03-31 MED ORDER — SODIUM CHLORIDE 0.9 % IV SOLN
250.0000 mL | Freq: Once | INTRAVENOUS | Status: AC
  Administered 2015-03-31: 250 mL via INTRAVENOUS

## 2015-03-31 MED ORDER — GLYCOPYRROLATE 0.2 MG/ML IJ SOLN
0.4000 mg | Freq: Once | INTRAMUSCULAR | Status: AC
  Administered 2015-03-31: 0.4 mg via INTRAVENOUS

## 2015-03-31 MED ORDER — LIDOCAINE HCL (CARDIAC) 20 MG/ML IV SOLN
4.0000 mg | Freq: Once | INTRAVENOUS | Status: AC
  Administered 2015-03-31: 4 mg via INTRAVENOUS

## 2015-03-31 MED ORDER — SUCCINYLCHOLINE CHLORIDE 20 MG/ML IJ SOLN
100.0000 mg | Freq: Once | INTRAMUSCULAR | Status: AC
  Administered 2015-03-31: 100 mg via INTRAVENOUS

## 2015-03-31 MED ORDER — LABETALOL HCL 5 MG/ML IV SOLN
20.0000 mg | Freq: Once | INTRAVENOUS | Status: AC
  Administered 2015-03-31: 20 mg via INTRAVENOUS

## 2015-03-31 MED ORDER — METHOHEXITAL 100 MG/10 ML PREMIX SYRINGE
70.0000 mg | Freq: Once | INTRAVENOUS | Status: AC
  Administered 2015-03-31: 70 mg via INTRAVENOUS

## 2015-03-31 MED ORDER — KETOROLAC TROMETHAMINE 30 MG/ML IJ SOLN
30.0000 mg | Freq: Once | INTRAMUSCULAR | Status: AC
  Administered 2015-03-31: 30 mg via INTRAVENOUS

## 2015-03-31 MED ORDER — ESMOLOL HCL-SODIUM CHLORIDE 2000 MG/100ML IV SOLN
10.0000 mg | Freq: Once | INTRAVENOUS | Status: AC
  Administered 2015-03-31: 10000 ug via INTRAVENOUS

## 2015-03-31 NOTE — Anesthesia Preprocedure Evaluation (Signed)
Anesthesia Evaluation  Patient identified by MRN, date of birth, ID band Patient awake    Reviewed: Allergy & Precautions, NPO status , Patient's Chart, lab work & pertinent test results  Airway Mallampati: III  TM Distance: >3 FB   Mouth opening: Limited Mouth Opening  Dental  (+) Teeth Intact   Pulmonary neg pulmonary ROS,    Pulmonary exam normal       Cardiovascular hypertension, Pt. on medications Normal cardiovascular exam    Neuro/Psych PSYCHIATRIC DISORDERS Depression Diabetic neuropathy  Neuromuscular disease    GI/Hepatic GERD-  Medicated and Controlled,  Endo/Other  diabetes, Well Controlled, Type 2Morbid obesity  Renal/GU      Musculoskeletal   Abdominal Normal abdominal exam  (+)   Peds  Hematology   Anesthesia Other Findings Obese, fasted, BG well controlled, tearful.  Reproductive/Obstetrics                             Anesthesia Physical Anesthesia Plan  ASA: III  Anesthesia Plan: General   Post-op Pain Management:    Induction:   Airway Management Planned: Mask  Additional Equipment:   Intra-op Plan:   Post-operative Plan:   Informed Consent: I have reviewed the patients History and Physical, chart, labs and discussed the procedure including the risks, benefits and alternatives for the proposed anesthesia with the patient or authorized representative who has indicated his/her understanding and acceptance.     Plan Discussed with: CRNA  Anesthesia Plan Comments:         Anesthesia Quick Evaluation

## 2015-03-31 NOTE — Anesthesia Procedure Notes (Signed)
Date/Time: 03/31/2015 11:59 AM Performed by: Nelda Marseille Pre-anesthesia Checklist: Patient identified, Timeout performed, Emergency Drugs available, Suction available and Patient being monitored Patient Re-evaluated:Patient Re-evaluated prior to inductionOxygen Delivery Method: Ambu bag Preoxygenation: Pre-oxygenation with 100% oxygen

## 2015-03-31 NOTE — Procedures (Signed)
ECT SERVICES Physician's Interval Evaluation & Treatment Note  Patient Identification: Melinda [name] MRN:  [id number] Date of Evaluation:  03/31/2015 TX #: [id number]  MADRS:   MMSE:   P.E. Findings:  Mood stable with no return of SI  Psychiatric Interval Note:  No physiacal or medical complaints  Subjective:  Patient is a 44 y.o. female seen for evaluation for Electroconvulsive Therapy. Doing well. Tolerating treatment well  Treatment Summary:   []   Right Unilateral             [x]  Bilateral   % Energy : 1.74ms 35%   Impedance: 810  Seizure Energy Index: 3058  Postictal Suppression Index: 95  Seizure Concordance Index: 92  Medications  Pre Shock: robinal 0.4mg , xyl 4mg , tor 30mg , lab 20mg , brev 70mg , esmolol 10mg , suc 100mg   Post Shock:    Seizure Duration: 31/56s   Comments: Follow up one week   Lungs:  [x]   Clear to auscultation               []  Other:   Heart:    [x]   Regular rhythm             []  irregular rhythm    [x]   Previous H&P reviewed, patient examined and there are NO CHANGES                 []   Previous H&P reviewed, patient examined and there are changes noted.   [name], MD 5/20/201611:56 AM

## 2015-03-31 NOTE — Transfer of Care (Signed)
Immediate Anesthesia Transfer of Care Note  Patient: Melinda [name]  Procedure(s) Performed: ECT  Patient Location: PACU  Anesthesia Type:General  Level of Consciousness: sedated  Airway & Oxygen Therapy: Patient connected to face mask oxygen  Post-op Assessment: Report given to RN  Post vital signs: stable  Last Vitals:  Filed Vitals:   03/31/15 0935  BP: 120/64  Pulse: 85  Temp: 36.3 C  Resp: 16    Complications: No apparent anesthesia complications

## 2015-03-31 NOTE — Addendum Note (Signed)
Addendum  created 03/31/15 1418 by Elyse Hsu, MD   Modules edited: Notes Section   Notes Section:  Delete: [id number]

## 2015-03-31 NOTE — Anesthesia Postprocedure Evaluation (Signed)
  Anesthesia Post-op Note  Patient: Melinda [name]  Procedure(s) Performed: * No procedures listed *  Anesthesia type:General  Patient location: PACU  Post pain: Pain level controlled  Post assessment: Post-op Vital signs reviewed, Patient's Cardiovascular Status Stable, Respiratory Function Stable, Patent Airway and No signs of Nausea or vomiting  Post vital signs: Reviewed and stable  Last Vitals:  Filed Vitals:   03/31/15 0935  BP: 120/64  Pulse: 85  Temp: 36.3 C  Resp: 16    Level of consciousness: awake, alert  and patient cooperative  Complications: No apparent anesthesia complications

## 2015-04-05 ENCOUNTER — Other Ambulatory Visit: Payer: Self-pay | Admitting: Psychiatry

## 2015-04-07 ENCOUNTER — Encounter: Payer: Self-pay | Admitting: Anesthesiology

## 2015-04-07 ENCOUNTER — Encounter
Admission: RE | Admit: 2015-04-07 | Discharge: 2015-04-07 | Disposition: A | Discharge status: Home / Self Care | Payer: Medicare PPO | Source: Ambulatory Visit | Attending: Psychiatry | Admitting: Psychiatry

## 2015-04-07 DIAGNOSIS — F339 Major depressive disorder, recurrent, unspecified: Secondary | ICD-10-CM | POA: Diagnosis not present

## 2015-04-07 DIAGNOSIS — F332 Major depressive disorder, recurrent severe without psychotic features: Secondary | ICD-10-CM | POA: Diagnosis not present

## 2015-04-07 LAB — GLUCOSE, CAPILLARY: Glucose-Capillary: 88 mg/dL (ref 65–99)

## 2015-04-07 LAB — POCT PREGNANCY, URINE: Preg Test, Ur: NEGATIVE

## 2015-04-07 MED ORDER — LIDOCAINE HCL (CARDIAC) 20 MG/ML IV SOLN
4.0000 mg | Freq: Once | INTRAVENOUS | Status: AC
  Administered 2015-04-07: 4 mg via INTRAVENOUS

## 2015-04-07 MED ORDER — GLYCOPYRROLATE 0.2 MG/ML IJ SOLN
0.4000 mg | Freq: Once | INTRAMUSCULAR | Status: AC
  Administered 2015-04-07: 0.4 mg via INTRAVENOUS

## 2015-04-07 MED ORDER — KETOROLAC TROMETHAMINE 30 MG/ML IJ SOLN
30.0000 mg | Freq: Once | INTRAMUSCULAR | Status: AC
  Administered 2015-04-07: 30 mg via INTRAVENOUS

## 2015-04-07 MED ORDER — SODIUM CHLORIDE 0.9 % IV SOLN
250.0000 mL | Freq: Once | INTRAVENOUS | Status: AC
  Administered 2015-04-07: 250 mL via INTRAVENOUS

## 2015-04-07 MED ORDER — LABETALOL HCL 5 MG/ML IV SOLN
20.0000 mg | Freq: Once | INTRAVENOUS | Status: AC
  Administered 2015-04-07: 20 mg via INTRAVENOUS

## 2015-04-07 MED ORDER — METHOHEXITAL SODIUM 100 MG/10ML IV SOSY
70.0000 mg | PREFILLED_SYRINGE | Freq: Once | INTRAVENOUS | Status: AC
  Administered 2015-04-07: 70 mg via INTRAVENOUS

## 2015-04-07 MED ORDER — SUCCINYLCHOLINE CHLORIDE 20 MG/ML IJ SOLN
100.0000 mg | Freq: Once | INTRAMUSCULAR | Status: AC
  Administered 2015-04-07: 100 mg via INTRAVENOUS

## 2015-04-07 MED ORDER — ESMOLOL HCL-SODIUM CHLORIDE 2000 MG/100ML IV SOLN
10.0000 mg | Freq: Once | INTRAVENOUS | Status: AC
  Administered 2015-04-07: 10000 ug via INTRAVENOUS

## 2015-04-07 NOTE — Transfer of Care (Signed)
Immediate Anesthesia Transfer of Care Note  Patient: Melinda [name]  Procedure(s) Performed: ect  Patient Location: PACU  Anesthesia Type:General  Level of Consciousness: sedated  Airway & Oxygen Therapy: Patient Spontanous Breathing and Patient connected to face mask oxygen  Post-op Assessment: Report given to RN  Post vital signs: Reviewed  Last Vitals:  Filed Vitals:   04/07/15 1315  BP:   Pulse:   Temp: 37.3 C  Resp:     Complications: No apparent anesthesia complications

## 2015-04-07 NOTE — Procedures (Signed)
ECT SERVICES Physician's Interval Evaluation & Treatment Note  Patient Identification: Melinda [name] MRN:  [id number] Date of Evaluation:  04/07/2015 TX #: [id number]  MADRS:   MMSE:   P.E. Findings:  No change to physical exam  Psychiatric Interval Note:  Mood stable   Subjective:  Patient is a 44 y.o. female seen for evaluation for Electroconvulsive Therapy. No new complaints  Treatment Summary:   []   Right Unilateral             [x]  Bilateral   % Energy : 1.46ms,35%   Impedance: 1390  Seizure Energy Index: 1916  Postictal Suppression Index: 87  Seizure Concordance Index: 88  Medications  Pre Shock: robinal 0.4mg , xyl 4mg , tor 30mg , lab 20mg , breev 70 mg, esmolol 10mg , suc 100mg   Post Shock:    Seizure Duration: 33/51s   Comments: Follow up next fri   Lungs:  [x]   Clear to auscultation               []  Other:   Heart:    [x]   Regular rhythm             []  irregular rhythm    [x]   Previous H&P reviewed, patient examined and there are NO CHANGES                 []   Previous H&P reviewed, patient examined and there are changes noted.   [name], MD 5/27/201612:56 PM

## 2015-04-07 NOTE — Anesthesia Preprocedure Evaluation (Addendum)
Anesthesia Evaluation  Patient identified by MRN, date of birth, ID band Patient awake    Reviewed: Allergy & Precautions, NPO status , Patient's Chart, lab work & pertinent test results  Airway Mallampati: III  TM Distance: >3 FB Neck ROM: Full    Dental  (+) Chipped   Pulmonary          Cardiovascular hypertension, Pt. on medications +CHF Rhythm:Regular     Neuro/Psych PSYCHIATRIC DISORDERS Depression  Neuromuscular disease    GI/Hepatic GERD-  ,  Endo/Other  diabetes, Type 2Morbid obesity  Renal/GU      Musculoskeletal   Abdominal   Peds  Hematology   Anesthesia Other Findings   Reproductive/Obstetrics                            Anesthesia Physical  Anesthesia Plan  ASA: III  Anesthesia Plan: General   Post-op Pain Management:    Induction: Intravenous  Airway Management Planned: Mask  Additional Equipment:   Intra-op Plan:   Post-operative Plan:   Informed Consent: I have reviewed the patients History and Physical, chart, labs and discussed the procedure including the risks, benefits and alternatives for the proposed anesthesia with the patient or authorized representative who has indicated his/her understanding and acceptance.     Plan Discussed with:   Anesthesia Plan Comments:         Anesthesia Quick Evaluation

## 2015-04-09 NOTE — Anesthesia Postprocedure Evaluation (Signed)
  Anesthesia Post-op Note  Patient: Melinda [name]  Procedure(s) Performed: * No procedures listed *  Anesthesia type:General  Patient location: PACU  Post pain: Pain level controlled  Post assessment: Post-op Vital signs reviewed, Patient's Cardiovascular Status Stable, Respiratory Function Stable, Patent Airway and No signs of Nausea or vomiting  Post vital signs: Reviewed and stable  Last Vitals:  Filed Vitals:   04/07/15 1359  BP: 130/49  Pulse: 80  Temp:   Resp: 18    Level of consciousness: awake, alert  and patient cooperative  Complications: No apparent anesthesia complications

## 2015-04-12 ENCOUNTER — Other Ambulatory Visit: Payer: Self-pay

## 2015-04-14 ENCOUNTER — Encounter
Admission: RE | Admit: 2015-04-14 | Discharge: 2015-04-14 | Disposition: A | Discharge status: Home / Self Care | Payer: Medicare PPO | Source: Ambulatory Visit | Attending: Psychiatry | Admitting: Psychiatry

## 2015-04-14 ENCOUNTER — Encounter: Payer: Self-pay | Admitting: Anesthesiology

## 2015-04-14 ENCOUNTER — Encounter: Payer: Medicare PPO | Admitting: Anesthesiology

## 2015-04-14 DIAGNOSIS — F332 Major depressive disorder, recurrent severe without psychotic features: Secondary | ICD-10-CM

## 2015-04-14 DIAGNOSIS — E119 Type 2 diabetes mellitus without complications: Secondary | ICD-10-CM | POA: Diagnosis not present

## 2015-04-14 DIAGNOSIS — F329 Major depressive disorder, single episode, unspecified: Secondary | ICD-10-CM | POA: Diagnosis not present

## 2015-04-14 DIAGNOSIS — I10 Essential (primary) hypertension: Secondary | ICD-10-CM | POA: Diagnosis not present

## 2015-04-14 LAB — GLUCOSE, CAPILLARY
Glucose-Capillary: 92 mg/dL (ref 65–99)
Glucose-Capillary: 90 mg/dL (ref 65–99)

## 2015-04-14 MED ORDER — LIDOCAINE HCL (CARDIAC) 20 MG/ML IV SOLN
4.0000 mg | Freq: Once | INTRAVENOUS | Status: AC
  Administered 2015-04-14: 4 mg via INTRAVENOUS

## 2015-04-14 MED ORDER — SODIUM CHLORIDE 0.9 % IV SOLN
INTRAVENOUS | Status: DC | PRN
  Administered 2015-04-14: 12:00:00 via INTRAVENOUS

## 2015-04-14 MED ORDER — FENTANYL CITRATE (PF) 100 MCG/2ML IJ SOLN: 25.0000 ug | INTRAMUSCULAR | Status: DC | PRN

## 2015-04-14 MED ORDER — ONDANSETRON HCL 4 MG/2ML IJ SOLN: 4.0000 mg | Freq: Once | INTRAMUSCULAR | Status: AC | PRN

## 2015-04-14 MED ORDER — LABETALOL HCL 5 MG/ML IV SOLN
20.0000 mg | Freq: Once | INTRAVENOUS | Status: AC
  Administered 2015-04-14: 20 mg via INTRAVENOUS

## 2015-04-14 MED ORDER — KETOROLAC TROMETHAMINE 30 MG/ML IJ SOLN
30.0000 mg | Freq: Once | INTRAMUSCULAR | Status: AC
  Administered 2015-04-14: 30 mg via INTRAVENOUS

## 2015-04-14 MED ORDER — SODIUM CHLORIDE 0.9 % IV SOLN
250.0000 mL | Freq: Once | INTRAVENOUS | Status: AC
  Administered 2015-04-14: 250 mL via INTRAVENOUS

## 2015-04-14 MED ORDER — SUCCINYLCHOLINE CHLORIDE 20 MG/ML IJ SOLN
100.0000 mg | Freq: Once | INTRAMUSCULAR | Status: AC
  Administered 2015-04-14: 100 mg via INTRAVENOUS

## 2015-04-14 MED ORDER — ESMOLOL HCL-SODIUM CHLORIDE 2000 MG/100ML IV SOLN
10.0000 mg | Freq: Once | INTRAVENOUS | Status: AC
  Administered 2015-04-14: 10000 ug via INTRAVENOUS

## 2015-04-14 MED ORDER — METHOHEXITAL SODIUM 100 MG/10ML IV SOSY
70.0000 mg | PREFILLED_SYRINGE | Freq: Once | INTRAVENOUS | Status: AC
  Administered 2015-04-14: 70 mg via INTRAVENOUS

## 2015-04-14 MED ORDER — GLYCOPYRROLATE 0.2 MG/ML IJ SOLN
0.4000 mg | Freq: Once | INTRAMUSCULAR | Status: AC
  Administered 2015-04-14: 0.4 mg via INTRAVENOUS

## 2015-04-14 NOTE — Anesthesia Preprocedure Evaluation (Signed)
Anesthesia Evaluation  Patient identified by MRN, date of birth, ID band Patient awake    Reviewed: Allergy & Precautions, NPO status , Patient's Chart, lab work & pertinent test results  Airway Mallampati: III  TM Distance: >3 FB Neck ROM: Full    Dental  (+) Chipped   Pulmonary          Cardiovascular hypertension, Pt. on medications +CHF Rhythm:Regular     Neuro/Psych PSYCHIATRIC DISORDERS Depression  Neuromuscular disease    GI/Hepatic GERD-  ,  Endo/Other  diabetes, Type 2Morbid obesity  Renal/GU      Musculoskeletal   Abdominal   Peds  Hematology   Anesthesia Other Findings   Reproductive/Obstetrics                             Anesthesia Physical  Anesthesia Plan  ASA: III  Anesthesia Plan: General   Post-op Pain Management:    Induction: Intravenous  Airway Management Planned: Mask  Additional Equipment:   Intra-op Plan:   Post-operative Plan:   Informed Consent: I have reviewed the patients History and Physical, chart, labs and discussed the procedure including the risks, benefits and alternatives for the proposed anesthesia with the patient or authorized representative who has indicated his/her understanding and acceptance.     Plan Discussed with:   Anesthesia Plan Comments:         Anesthesia Quick Evaluation

## 2015-04-14 NOTE — Procedures (Signed)
ECT SERVICES Physician's Interval Evaluation & Treatment Note  Patient Identification: GLENYCE RANDLE MRN:  [id number] Date of Evaluation:  04/14/2015 TX #: [id number]  MADRS:   MMSE:   P.E. Findings:  No change to PE  Psychiatric Interval Note:  Feeling good. Smiling. Stable  Subjective:  Patient is a 44 y.o. female seen for evaluation for Electroconvulsive Therapy. No new complaqint  Treatment Summary:   []   Right Unilateral             [x]  Bilateral   % Energy : 1.42ms 35%   Impedance: 1000  Seizure Energy Index: 2047  Postictal Suppression Index: 95  Seizure Concordance Index: 89  Medications  Pre Shock: rob 0.4mg ,xyl 4mg , lab 20mg , tor 30mg , brev 70mg , esmolol 10mg , suc 100mg   Post Shock:    Seizure Duration: 27/35s   Comments: Follow up one week   Lungs:  [x]   Clear to auscultation               []  Other:   Heart:    [x]   Regular rhythm             []  irregular rhythm    [x]   Previous H&P reviewed, patient examined and there are NO CHANGES                 []   Previous H&P reviewed, patient examined and there are changes noted.   [name], MD 6/3/201611:50 AM

## 2015-04-14 NOTE — Transfer of Care (Signed)
Immediate Anesthesia Transfer of Care Note  Patient: Melinda [name]  Procedure(s) Performed: ECT Patient Location: PACU  Anesthesia Type:General  Level of Consciousness: awake  Airway & Oxygen Therapy: Patient Spontanous Breathing and Patient connected to face mask oxygen  Post-op Assessment: Report given to RN and Post -op Vital signs reviewed and stable  Post vital signs: stable  Last Vitals:  Filed Vitals:   04/14/15 1209  BP: 118/66  Pulse: 92  Temp:   Resp: 13    Complications: No apparent anesthesia complications

## 2015-04-17 NOTE — Anesthesia Postprocedure Evaluation (Signed)
  Anesthesia Post-op Note  Patient: Melinda [name]  Procedure(s) Performed: * No procedures listed *  Anesthesia type:General  Patient location: PACU  Post pain: Pain level controlled  Post assessment: Post-op Vital signs reviewed, Patient's Cardiovascular Status Stable, Respiratory Function Stable, Patent Airway and No signs of Nausea or vomiting  Post vital signs: Reviewed and stable  Last Vitals:  Filed Vitals:   04/14/15 1251  BP: 107/56  Pulse: 81  Temp:   Resp: 18    Level of consciousness: awake, alert  and patient cooperative  Complications: No apparent anesthesia complications

## 2015-04-19 ENCOUNTER — Other Ambulatory Visit: Payer: Self-pay | Admitting: *Deleted

## 2015-04-21 ENCOUNTER — Encounter: Payer: Self-pay | Admitting: Anesthesiology

## 2015-04-21 ENCOUNTER — Encounter: Payer: Medicare PPO | Admitting: Anesthesiology

## 2015-04-21 ENCOUNTER — Encounter (HOSPITAL_BASED_OUTPATIENT_CLINIC_OR_DEPARTMENT_OTHER)
Admission: RE | Admit: 2015-04-21 | Discharge: 2015-04-21 | Disposition: A | Discharge status: Home / Self Care | Payer: Medicare PPO | Source: Ambulatory Visit | Attending: Psychiatry | Admitting: Psychiatry

## 2015-04-21 DIAGNOSIS — F339 Major depressive disorder, recurrent, unspecified: Secondary | ICD-10-CM | POA: Diagnosis not present

## 2015-04-21 DIAGNOSIS — F332 Major depressive disorder, recurrent severe without psychotic features: Secondary | ICD-10-CM | POA: Diagnosis not present

## 2015-04-21 DIAGNOSIS — E119 Type 2 diabetes mellitus without complications: Secondary | ICD-10-CM | POA: Diagnosis not present

## 2015-04-21 DIAGNOSIS — I10 Essential (primary) hypertension: Secondary | ICD-10-CM | POA: Diagnosis not present

## 2015-04-21 LAB — GLUCOSE, CAPILLARY: Glucose-Capillary: 82 mg/dL (ref 65–99)

## 2015-04-21 MED ORDER — DEXTROSE 5 % IV SOLN
INTRAVENOUS | Status: DC | PRN
  Administered 2015-04-21: 11:00:00 via INTRAVENOUS

## 2015-04-21 MED ORDER — LABETALOL HCL 5 MG/ML IV SOLN
20.0000 mg | Freq: Once | INTRAVENOUS | Status: AC
  Administered 2015-04-21: 20 mg via INTRAVENOUS

## 2015-04-21 MED ORDER — METHOHEXITAL SODIUM 100 MG/10ML IV SOSY
70.0000 mg | PREFILLED_SYRINGE | Freq: Once | INTRAVENOUS | Status: AC
  Administered 2015-04-21: 70 mg via INTRAVENOUS

## 2015-04-21 MED ORDER — KETOROLAC TROMETHAMINE 30 MG/ML IJ SOLN
30.0000 mg | Freq: Once | INTRAMUSCULAR | Status: AC
  Administered 2015-04-21: 30 mg via INTRAVENOUS

## 2015-04-21 MED ORDER — SODIUM CHLORIDE 0.9 % IV SOLN
250.0000 mL | Freq: Once | INTRAVENOUS | Status: AC
  Administered 2015-04-21: 250 mL via INTRAVENOUS

## 2015-04-21 MED ORDER — LIDOCAINE HCL (CARDIAC) 20 MG/ML IV SOLN
4.0000 mg | Freq: Once | INTRAVENOUS | Status: AC
  Administered 2015-04-21: 4 mg via INTRAVENOUS

## 2015-04-21 MED ORDER — SUCCINYLCHOLINE CHLORIDE 20 MG/ML IJ SOLN
100.0000 mg | Freq: Once | INTRAMUSCULAR | Status: AC
  Administered 2015-04-21: 100 mg via INTRAVENOUS

## 2015-04-21 MED ORDER — GLYCOPYRROLATE 0.2 MG/ML IJ SOLN
0.4000 mg | Freq: Once | INTRAMUSCULAR | Status: AC
  Administered 2015-04-21: 0.4 mg via INTRAVENOUS

## 2015-04-21 MED ORDER — ESMOLOL HCL-SODIUM CHLORIDE 2000 MG/100ML IV SOLN
10.0000 mg | Freq: Once | INTRAVENOUS | Status: AC
  Administered 2015-04-21: 10000 ug via INTRAVENOUS

## 2015-04-21 NOTE — Anesthesia Postprocedure Evaluation (Signed)
  Anesthesia Post-op Note  Patient: Melinda [name]  Procedure(s) Performed: ECT  Anesthesia type:General  Patient location: PACU  Post pain: Pain level controlled  Post assessment: Post-op Vital signs reviewed, Patient's Cardiovascular Status Stable, Respiratory Function Stable, Patent Airway and No signs of Nausea or vomiting  Post vital signs: Reviewed and stable  Last Vitals:  Filed Vitals:   04/21/15 1202  BP: 101/74  Pulse: 82  Temp:   Resp: 16    Level of consciousness: awake, alert  and patient cooperative  Complications: No apparent anesthesia complications

## 2015-04-21 NOTE — Anesthesia Preprocedure Evaluation (Signed)
Anesthesia Evaluation  Patient identified by MRN, date of birth, ID band Patient awake    Reviewed: Allergy & Precautions, NPO status , Patient's Chart, lab work & pertinent test results  History of Anesthesia Complications Negative for: history of anesthetic complications  Airway Mallampati: II  TM Distance: >3 FB Neck ROM: Full    Dental no notable dental hx.    Pulmonary neg pulmonary ROS,  breath sounds clear to auscultation  Pulmonary exam normal       Cardiovascular hypertension, Pt. on medications Normal cardiovascular examRhythm:Regular Rate:Normal     Neuro/Psych PSYCHIATRIC DISORDERS Depression negative neurological ROS     GI/Hepatic Neg liver ROS, GERD-  ,  Endo/Other  negative endocrine ROSdiabetes, Type 2, Oral Hypoglycemic Agents  Renal/GU negative Renal ROS  negative genitourinary   Musculoskeletal negative musculoskeletal ROS (+)   Abdominal   Peds negative pediatric ROS (+)  Hematology negative hematology ROS (+)   Anesthesia Other Findings   Reproductive/Obstetrics negative OB ROS                             Anesthesia Physical Anesthesia Plan  ASA: III  Anesthesia Plan: General   Post-op Pain Management:    Induction: Intravenous  Airway Management Planned: Mask  Additional Equipment:   Intra-op Plan:   Post-operative Plan:   Informed Consent: I have reviewed the patients History and Physical, chart, labs and discussed the procedure including the risks, benefits and alternatives for the proposed anesthesia with the patient or authorized representative who has indicated his/her understanding and acceptance.   Dental advisory given  Plan Discussed with: CRNA and Surgeon  Anesthesia Plan Comments:         Anesthesia Quick Evaluation

## 2015-04-21 NOTE — Anesthesia Procedure Notes (Signed)
Date/Time: 04/21/2015 11:05 AM Performed by: Delaney Meigs Pre-anesthesia Checklist: Patient identified, Emergency Drugs available, Suction available and Patient being monitored Patient Re-evaluated:Patient Re-evaluated prior to inductionOxygen Delivery Method: Circle system utilized Preoxygenation: Pre-oxygenation with 100% oxygen Intubation Type: IV induction Ventilation: Mask ventilation without difficulty and Mask ventilation throughout procedure Airway Equipment and Method: Bite block Placement Confirmation: positive ETCO2 Dental Injury: Teeth and Oropharynx as per pre-operative assessment

## 2015-04-21 NOTE — Procedures (Signed)
ECT SERVICES Physician's Interval Evaluation & Treatment Note  Patient Identification: Melinda [name] MRN:  [id number] Date of Evaluation:  04/21/2015 TX #: [id number]  MADRS:   MMSE:   P.E. Findings:  No new physical complaints. No sign of self injury  Psychiatric Interval Note:  She had a good week. Mood is pretty good. Doesn't feel particularly depressed. No suicidal ideation  Subjective:  Patient is a 44 y.o. female seen for evaluation for Electroconvulsive Therapy. No new complaints. Agreeable to treatment  Treatment Summary:   []   Right Unilateral             [x]  Bilateral   % Energy : 1.0 ms, 35%   Impedance: 860 ohms  Seizure Energy Index: 6102 V squared  Postictal Suppression Index: 90%  Seizure Concordance Index: 98%  Medications  Pre Shock: Robinul 0.4 mg, Xylocaine 4 mg, labetalol 20 mg, Toradol 30 mg, Brevital 70 mg, esmolol 10 mg, succinylcholine 100 mg  Post Shock: None  Seizure Duration: 40 seconds by EMG, 73 seconds by EEG   Comments: Follow-up one week   Lungs:  [x]   Clear to auscultation               []  Other:   Heart:    [x]   Regular rhythm             []  irregular rhythm    [x]   Previous H&P reviewed, patient examined and there are NO CHANGES                 []   Previous H&P reviewed, patient examined and there are changes noted.   [name], MD 6/10/201611:02 AM

## 2015-04-21 NOTE — Transfer of Care (Signed)
Immediate Anesthesia Transfer of Care Note  Patient: Melinda [name]  Procedure(s) Performed: ECT Patient Location: PACU  Anesthesia Type:General  Level of Consciousness: sedated  Airway & Oxygen Therapy: Patient Spontanous Breathing and Patient connected to face mask oxygen  Post-op Assessment: Report given to RN and Post -op Vital signs reviewed and stable  Post vital signs: Reviewed and stable  Last Vitals:  Filed Vitals:   04/21/15 1117  BP: 112/101  Pulse: 99  Temp: 37.5 C  Resp: 16    Complications: No apparent anesthesia complications

## 2015-04-26 ENCOUNTER — Other Ambulatory Visit: Payer: Self-pay | Admitting: *Deleted

## 2015-04-26 LAB — POCT PREGNANCY, URINE: Preg Test, Ur: NEGATIVE

## 2015-04-28 ENCOUNTER — Other Ambulatory Visit: Payer: Self-pay | Admitting: *Deleted

## 2015-04-28 ENCOUNTER — Encounter (HOSPITAL_BASED_OUTPATIENT_CLINIC_OR_DEPARTMENT_OTHER)
Admission: RE | Admit: 2015-04-28 | Discharge: 2015-04-28 | Disposition: A | Discharge status: Home / Self Care | Payer: Medicare PPO | Source: Ambulatory Visit | Attending: Psychiatry | Admitting: Psychiatry

## 2015-04-28 ENCOUNTER — Encounter: Payer: Medicare PPO | Admitting: *Deleted

## 2015-04-28 ENCOUNTER — Encounter: Payer: Self-pay | Admitting: *Deleted

## 2015-04-28 DIAGNOSIS — E7800 Pure hypercholesterolemia, unspecified: Secondary | ICD-10-CM | POA: Insufficient documentation

## 2015-04-28 DIAGNOSIS — F332 Major depressive disorder, recurrent severe without psychotic features: Secondary | ICD-10-CM

## 2015-04-28 DIAGNOSIS — F322 Major depressive disorder, single episode, severe without psychotic features: Secondary | ICD-10-CM | POA: Insufficient documentation

## 2015-04-28 DIAGNOSIS — T1491XA Suicide attempt, initial encounter: Secondary | ICD-10-CM | POA: Insufficient documentation

## 2015-04-28 DIAGNOSIS — E785 Hyperlipidemia, unspecified: Secondary | ICD-10-CM | POA: Insufficient documentation

## 2015-04-28 DIAGNOSIS — E1159 Type 2 diabetes mellitus with other circulatory complications: Secondary | ICD-10-CM | POA: Insufficient documentation

## 2015-04-28 DIAGNOSIS — E119 Type 2 diabetes mellitus without complications: Secondary | ICD-10-CM | POA: Insufficient documentation

## 2015-04-28 DIAGNOSIS — I152 Hypertension secondary to endocrine disorders: Secondary | ICD-10-CM | POA: Insufficient documentation

## 2015-04-28 DIAGNOSIS — F319 Bipolar disorder, unspecified: Secondary | ICD-10-CM | POA: Insufficient documentation

## 2015-04-28 DIAGNOSIS — I429 Cardiomyopathy, unspecified: Secondary | ICD-10-CM | POA: Insufficient documentation

## 2015-04-28 DIAGNOSIS — G4730 Sleep apnea, unspecified: Secondary | ICD-10-CM | POA: Insufficient documentation

## 2015-04-28 DIAGNOSIS — E1169 Type 2 diabetes mellitus with other specified complication: Secondary | ICD-10-CM | POA: Insufficient documentation

## 2015-04-28 DIAGNOSIS — G64 Other disorders of peripheral nervous system: Secondary | ICD-10-CM | POA: Insufficient documentation

## 2015-04-28 DIAGNOSIS — I10 Essential (primary) hypertension: Secondary | ICD-10-CM | POA: Insufficient documentation

## 2015-04-28 LAB — GLUCOSE, CAPILLARY: Glucose-Capillary: 83 mg/dL (ref 65–99)

## 2015-04-28 LAB — POCT PREGNANCY, URINE: Preg Test, Ur: NEGATIVE

## 2015-04-28 MED ORDER — SODIUM CHLORIDE 0.9 % IV SOLN
INTRAVENOUS | Status: DC | PRN
  Administered 2015-04-28: 12:00:00 via INTRAVENOUS

## 2015-04-28 MED ORDER — ESMOLOL HCL-SODIUM CHLORIDE 2000 MG/100ML IV SOLN
10.0000 mg | Freq: Once | INTRAVENOUS | Status: AC
  Administered 2015-04-28: 10 mg via INTRAVENOUS

## 2015-04-28 MED ORDER — LIDOCAINE HCL (CARDIAC) 20 MG/ML IV SOLN
4.0000 mg | Freq: Once | INTRAVENOUS | Status: AC
  Administered 2015-04-28: 4 mg via INTRAVENOUS

## 2015-04-28 MED ORDER — SODIUM CHLORIDE 0.9 % IV SOLN
250.0000 mL | Freq: Once | INTRAVENOUS | Status: AC
  Administered 2015-04-28: 250 mL via INTRAVENOUS

## 2015-04-28 MED ORDER — GLYCOPYRROLATE 0.2 MG/ML IJ SOLN
0.4000 mg | Freq: Once | INTRAMUSCULAR | Status: AC
  Administered 2015-04-28: 0.4 mg via INTRAVENOUS

## 2015-04-28 MED ORDER — SUCCINYLCHOLINE CHLORIDE 20 MG/ML IJ SOLN
100.0000 mg | Freq: Once | INTRAMUSCULAR | Status: AC
  Administered 2015-04-28: 100 mg via INTRAVENOUS

## 2015-04-28 MED ORDER — LABETALOL HCL 5 MG/ML IV SOLN
20.0000 mg | Freq: Once | INTRAVENOUS | Status: AC
  Administered 2015-04-28: 20 mg via INTRAVENOUS

## 2015-04-28 MED ORDER — KETOROLAC TROMETHAMINE 30 MG/ML IJ SOLN
30.0000 mg | Freq: Once | INTRAMUSCULAR | Status: AC
  Administered 2015-04-28: 30 mg via INTRAVENOUS

## 2015-04-28 MED ORDER — METHOHEXITAL SODIUM 100 MG/10ML IV SOSY
70.0000 mg | PREFILLED_SYRINGE | Freq: Once | INTRAVENOUS | Status: AC
  Administered 2015-04-28: 70 mg via INTRAVENOUS

## 2015-04-28 NOTE — Anesthesia Postprocedure Evaluation (Signed)
  Anesthesia Post-op Note  Patient: Melinda [name]  Procedure(s) Performed: * No procedures listed *  Anesthesia type:General  Patient location: PACU  Post pain: Pain level controlled  Post assessment: Post-op Vital signs reviewed, Patient's Cardiovascular Status Stable, Respiratory Function Stable, Patent Airway and No signs of Nausea or vomiting  Post vital signs: Reviewed and stable  Last Vitals:  Filed Vitals:   04/28/15 1248  BP: 130/73  Pulse: 96  Temp:   Resp: 29    Level of consciousness: awake, alert  and patient cooperative  Complications: No apparent anesthesia complications

## 2015-04-28 NOTE — Anesthesia Procedure Notes (Signed)
Performed by: Jonna Clark Pre-anesthesia Checklist: Patient identified, Emergency Drugs available, Timeout performed, Suction available and Patient being monitored Patient Re-evaluated:Patient Re-evaluated prior to inductionOxygen Delivery Method: Circle system utilized Preoxygenation: Pre-oxygenation with 100% oxygen Ventilation: Mask ventilation throughout procedure Airway Equipment and Method: Bite block

## 2015-04-28 NOTE — Procedures (Signed)
ECT SERVICES Physician's Interval Evaluation & Treatment Note  Patient Identification: Melinda [name] MRN:  [id number] Date of Evaluation:  04/28/2015 TX #: [id number]  MADRS: 19  MMSE: 29  P.E. Findings:  No new findings except for noticing 1 very superficial cut or scratch on the right forearm  Psychiatric Interval Note:  Mood is about the same as usual. No real new complaints. No suicidal ideation  Subjective:  Patient is a 44 y.o. female seen for evaluation for Electroconvulsive Therapy. Feeling a little more down this morning but not severe  Treatment Summary:   []   Right Unilateral             [x]  Bilateral   % Energy : 1.0 ms 35%   Impedance: 790 ohms  Seizure Energy Index: 4245 V squared  Postictal Suppression Index: 95%  Seizure Concordance Index:  97%  Medications  Pre Shock: Robinul 0.4 mg, Xylocaine 4 mg, labetalol 20 mg, Toradol 30 mg, Brevital 70 mg, esmolol 10 mg, succinylcholine 100 mg  Post Shock: None  Seizure Duration: 33 seconds by EMG, 55 seconds by EEG   Comments: Follow-up one week   Lungs:  [x]   Clear to auscultation               []  Other:   Heart:    [x]   Regular rhythm             []  irregular rhythm    [x]   Previous H&P reviewed, patient examined and there are NO CHANGES                 []   Previous H&P reviewed, patient examined and there are changes noted.   [name], MD 6/17/201612:20 PM

## 2015-04-28 NOTE — Anesthesia Preprocedure Evaluation (Signed)
Anesthesia Evaluation  Patient identified by MRN, date of birth, ID band Patient awake    Reviewed: Allergy & Precautions, NPO status , Patient's Chart, lab work & pertinent test results  Airway Mallampati: II  TM Distance: >3 FB Neck ROM: Full    Dental  (+) Teeth Intact   Pulmonary    Pulmonary exam normal       Cardiovascular hypertension, Pt. on medications Normal cardiovascular exam    Neuro/Psych Peripheral neuropathy--diabetic.    GI/Hepatic   Endo/Other  diabetes, Type 2  Renal/GU      Musculoskeletal   Abdominal (+) + obese,  Abdomen: soft.    Peds  Hematology   Anesthesia Other Findings   Reproductive/Obstetrics                             Anesthesia Physical Anesthesia Plan  ASA: III  Anesthesia Plan: General   Post-op Pain Management:    Induction: Intravenous  Airway Management Planned: Mask  Additional Equipment:   Intra-op Plan:   Post-operative Plan:   Informed Consent: I have reviewed the patients History and Physical, chart, labs and discussed the procedure including the risks, benefits and alternatives for the proposed anesthesia with the patient or authorized representative who has indicated his/her understanding and acceptance.     Plan Discussed with: CRNA  Anesthesia Plan Comments:         Anesthesia Quick Evaluation

## 2015-04-28 NOTE — Transfer of Care (Signed)
Immediate Anesthesia Transfer of Care Note  Patient: Melinda [name]  Procedure(s) Performed: * No procedures listed *  Patient Location: PACU  Anesthesia Type:General  Level of Consciousness: sedated and patient cooperative  Airway & Oxygen Therapy: Patient Spontanous Breathing and Patient connected to face mask oxygen  Post-op Assessment: Report given to RN and Post -op Vital signs reviewed and stable  Post vital signs: Reviewed and stable  Last Vitals:  Filed Vitals:   04/28/15 1237  BP: 106/30  Pulse: 98  Temp: 37.3 C  Resp: 18    Complications: No apparent anesthesia complications

## 2015-05-05 ENCOUNTER — Encounter: Payer: Self-pay | Admitting: Anesthesiology

## 2015-05-05 ENCOUNTER — Encounter (HOSPITAL_BASED_OUTPATIENT_CLINIC_OR_DEPARTMENT_OTHER)
Admission: RE | Admit: 2015-05-05 | Discharge: 2015-05-05 | Disposition: A | Discharge status: Home / Self Care | Payer: Medicare PPO | Source: Ambulatory Visit | Attending: Psychiatry | Admitting: Psychiatry

## 2015-05-05 DIAGNOSIS — F332 Major depressive disorder, recurrent severe without psychotic features: Secondary | ICD-10-CM

## 2015-05-05 LAB — POCT PREGNANCY, URINE: Preg Test, Ur: NEGATIVE

## 2015-05-05 LAB — GLUCOSE, CAPILLARY: Glucose-Capillary: 89 mg/dL (ref 65–99)

## 2015-05-05 MED ORDER — FENTANYL CITRATE (PF) 100 MCG/2ML IJ SOLN
25.0000 ug | INTRAMUSCULAR | Status: DC | PRN
Start: 2015-05-05 — End: 2015-05-06

## 2015-05-05 MED ORDER — ESMOLOL HCL-SODIUM CHLORIDE 2000 MG/100ML IV SOLN
10.0000 mg | Freq: Once | INTRAVENOUS | Status: AC
  Administered 2015-05-05: 10000 ug via INTRAVENOUS

## 2015-05-05 MED ORDER — KETOROLAC TROMETHAMINE 30 MG/ML IJ SOLN
30.0000 mg | Freq: Once | INTRAMUSCULAR | Status: AC
  Administered 2015-05-05: 30 mg via INTRAVENOUS

## 2015-05-05 MED ORDER — METHOHEXITAL SODIUM 100 MG/10ML IV SOSY
70.0000 mg | PREFILLED_SYRINGE | Freq: Once | INTRAVENOUS | Status: AC
  Administered 2015-05-05: 70 mg via INTRAVENOUS

## 2015-05-05 MED ORDER — ONDANSETRON HCL 4 MG/2ML IJ SOLN: 4.0000 mg | Freq: Once | INTRAMUSCULAR | Status: AC | PRN

## 2015-05-05 MED ORDER — LABETALOL HCL 5 MG/ML IV SOLN
20.0000 mg | Freq: Once | INTRAVENOUS | Status: AC
  Administered 2015-05-05: 20 mg via INTRAVENOUS

## 2015-05-05 MED ORDER — SUCCINYLCHOLINE CHLORIDE 20 MG/ML IJ SOLN
100.0000 mg | Freq: Once | INTRAMUSCULAR | Status: AC
  Administered 2015-05-05: 100 mg via INTRAVENOUS

## 2015-05-05 MED ORDER — LIDOCAINE HCL (CARDIAC) 20 MG/ML IV SOLN
4.0000 mg | Freq: Once | INTRAVENOUS | Status: AC
  Administered 2015-05-05: 4 mg via INTRAVENOUS

## 2015-05-05 MED ORDER — GLYCOPYRROLATE 0.2 MG/ML IJ SOLN
0.4000 mg | Freq: Once | INTRAMUSCULAR | Status: AC
  Administered 2015-05-05: 0.4 mg via INTRAVENOUS

## 2015-05-05 MED ORDER — SODIUM CHLORIDE 0.9 % IV SOLN
250.0000 mL | Freq: Once | INTRAVENOUS | Status: AC
  Administered 2015-05-05: 250 mL via INTRAVENOUS

## 2015-05-05 NOTE — Anesthesia Preprocedure Evaluation (Addendum)
Anesthesia Evaluation  Patient identified by MRN, date of birth, ID band Patient awake    Reviewed: Allergy & Precautions, NPO status , Patient's Chart, lab work & pertinent test results  Airway Mallampati: II  TM Distance: >3 FB Neck ROM: Full    Dental  (+) Teeth Intact   Pulmonary sleep apnea ,    Pulmonary exam normal       Cardiovascular hypertension, Pt. on medications Normal cardiovascular exam cardiomyopathy   Neuro/Psych PSYCHIATRIC DISORDERS Depression Bipolar Disorder Peripheral neuropathy--diabetic.  Neuromuscular disease    GI/Hepatic Neg liver ROS, GERD-  Medicated and Controlled,  Endo/Other  diabetes, Well Controlled, Type 2  Renal/GU negative Renal ROS     Musculoskeletal negative musculoskeletal ROS (+)   Abdominal Normal abdominal exam  (+) + obese,  Abdomen: soft.    Peds negative pediatric ROS (+)  Hematology negative hematology ROS (+)   Anesthesia Other Findings   Reproductive/Obstetrics                            Anesthesia Physical  Anesthesia Plan  ASA: III  Anesthesia Plan: General   Post-op Pain Management:    Induction: Intravenous  Airway Management Planned: Mask  Additional Equipment:   Intra-op Plan:   Post-operative Plan:   Informed Consent: I have reviewed the patients History and Physical, chart, labs and discussed the procedure including the risks, benefits and alternatives for the proposed anesthesia with the patient or authorized representative who has indicated his/her understanding and acceptance.   Dental advisory given  Plan Discussed with: CRNA and Surgeon  Anesthesia Plan Comments:         Anesthesia Quick Evaluation                                  Anesthesia Evaluation  Patient identified by MRN, date of birth, ID band Patient awake    Reviewed: Allergy & Precautions, NPO status , Patient's Chart, lab work &  pertinent test results  History of Anesthesia Complications Negative for: history of anesthetic complications  Airway Mallampati: II  TM Distance: >3 FB Neck ROM: Full    Dental no notable dental hx.    Pulmonary neg pulmonary ROS,  breath sounds clear to auscultation  Pulmonary exam normal       Cardiovascular hypertension, Pt. on medications Normal cardiovascular examRhythm:Regular Rate:Normal     Neuro/Psych PSYCHIATRIC DISORDERS Depression negative neurological ROS     GI/Hepatic Neg liver ROS, GERD-  ,  Endo/Other  negative endocrine ROSdiabetes, Type 2, Oral Hypoglycemic Agents  Renal/GU negative Renal ROS  negative genitourinary   Musculoskeletal negative musculoskeletal ROS (+)   Abdominal   Peds negative pediatric ROS (+)  Hematology negative hematology ROS (+)   Anesthesia Other Findings   Reproductive/Obstetrics negative OB ROS                             Anesthesia Physical Anesthesia Plan  ASA: III  Anesthesia Plan: General   Post-op Pain Management:    Induction: Intravenous  Airway Management Planned: Mask  Additional Equipment:   Intra-op Plan:   Post-operative Plan:   Informed Consent: I have reviewed the patients History and Physical, chart, labs and discussed the procedure including the risks, benefits and alternatives for the proposed anesthesia with the patient or authorized representative who has indicated  his/her understanding and acceptance.   Dental advisory given  Plan Discussed with: CRNA and Surgeon  Anesthesia Plan Comments:         Anesthesia Quick Evaluation

## 2015-05-05 NOTE — Procedures (Signed)
ECT SERVICES Physician's Interval Evaluation & Treatment Note  Patient Identification: Melinda [name] MRN:  [id number] Date of Evaluation:  05/05/2015 TX #: [id number]  MADRS:   MMSE:   P.E. Findings:  No new physical findings  Psychiatric Interval Note:  Mood is stable no new psychiatric complaints or findings  Subjective:  Patient is a 44 y.o. female seen for evaluation for Electroconvulsive Therapy. Patient has no new complaints  Treatment Summary:   []   Right Unilateral             [x]  Bilateral   % Energy : 1.0 ms 35%   Impedance: 710 homes  Seizure Energy Index: 3228 V squared  Postictal Suppression Index: 90%  Seizure Concordance Index: 95%  Medications  Pre Shock: Robinul 0.4 mg, Xylocaine 4 mg, labetalol 20 mg, Toradol 30 mg, Brevital 70 mg, esmolol 10 mg, succinylcholine 100 mg  Post Shock:    Seizure Duration: 33 seconds by EMG, 59 seconds by EEG   Comments: Follow-up one week usual schedule   Lungs:  [x]   Clear to auscultation               []  Other:   Heart:    [x]   Regular rhythm             []  irregular rhythm    [x]   Previous H&P reviewed, patient examined and there are NO CHANGES                 []   Previous H&P reviewed, patient examined and there are changes noted.   [name], MD 6/24/20161:29 PM

## 2015-05-05 NOTE — Anesthesia Postprocedure Evaluation (Signed)
  Anesthesia Post-op Note  Patient: Melinda [name]  Procedure(s) Performed: * No procedures listed *  Anesthesia type:General  Patient location: PACU  Post pain: Pain level controlled  Post assessment: Post-op Vital signs reviewed, Patient's Cardiovascular Status Stable, Respiratory Function Stable, Patent Airway and No signs of Nausea or vomiting  Post vital signs: Reviewed and stable  Last Vitals:  Filed Vitals:   05/05/15 1429  BP: 121/53  Pulse: 84  Temp:   Resp: 18    Level of consciousness: awake, alert  and patient cooperative  Complications: No apparent anesthesia complications

## 2015-05-05 NOTE — Transfer of Care (Signed)
Immediate Anesthesia Transfer of Care Note  Patient: Melinda [name]  Procedure(s) Performed: * No procedures listed *  Patient Location: PACU  Anesthesia Type:General  Level of Consciousness: awake, alert  and oriented  Airway & Oxygen Therapy: Patient Spontanous Breathing  Post-op Assessment: Report given to RN and Post -op Vital signs reviewed and stable  Post vital signs: Reviewed and stable  Last Vitals:  Filed Vitals:   05/05/15 1145  BP: 112/53  Pulse: 92  Temp: 36.7 C  Resp: 18    Complications: No apparent anesthesia complications

## 2015-05-05 NOTE — Transfer of Care (Signed)
Immediate Anesthesia Transfer of Care Note  Patient: Melinda [name]  Procedure(s) Performed: ECT  Patient Location: PACU  Anesthesia Type:General  Level of Consciousness: sedated  Airway & Oxygen Therapy: Patient connected to face mask oxygen  Post-op Assessment: Report given to RN  Post vital signs: Reviewed and stable  Last Vitals:  Filed Vitals:   05/05/15 1145  BP: 112/53  Pulse: 92  Temp: 36.7 C  Resp: 18    Complications: No apparent anesthesia complications

## 2015-05-10 ENCOUNTER — Other Ambulatory Visit: Payer: Self-pay | Admitting: *Deleted

## 2015-05-12 ENCOUNTER — Encounter: Payer: Self-pay | Admitting: Certified Registered Nurse Anesthetist

## 2015-05-12 ENCOUNTER — Encounter: Payer: Medicare PPO | Admitting: Certified Registered Nurse Anesthetist

## 2015-05-12 ENCOUNTER — Ambulatory Visit
Admission: RE | Admit: 2015-05-12 | Discharge: 2015-05-12 | Disposition: A | Discharge status: Home / Self Care | Payer: Medicare PPO | Source: Ambulatory Visit | Attending: Psychiatry | Admitting: Psychiatry

## 2015-05-12 DIAGNOSIS — F332 Major depressive disorder, recurrent severe without psychotic features: Secondary | ICD-10-CM | POA: Diagnosis not present

## 2015-05-12 LAB — GLUCOSE, CAPILLARY: Glucose-Capillary: 98 mg/dL (ref 65–99)

## 2015-05-12 LAB — POCT PREGNANCY, URINE: Preg Test, Ur: NEGATIVE

## 2015-05-12 MED ORDER — LABETALOL HCL 5 MG/ML IV SOLN
20.0000 mg | Freq: Once | INTRAVENOUS | Status: AC
  Administered 2015-05-12: 20 mg via INTRAVENOUS

## 2015-05-12 MED ORDER — SODIUM CHLORIDE 0.9 % IV SOLN
250.0000 mL | Freq: Once | INTRAVENOUS | Status: AC
  Administered 2015-05-12: 250 mL via INTRAVENOUS

## 2015-05-12 MED ORDER — GLYCOPYRROLATE 0.2 MG/ML IJ SOLN
0.4000 mg | Freq: Once | INTRAMUSCULAR | Status: AC
  Administered 2015-05-12: 0.4 mg via INTRAVENOUS

## 2015-05-12 MED ORDER — SODIUM CHLORIDE 0.9 % IV SOLN
INTRAVENOUS | Status: DC | PRN
  Administered 2015-05-12: 10:00:00 via INTRAVENOUS

## 2015-05-12 MED ORDER — METHOHEXITAL SODIUM 100 MG/10ML IV SOSY
70.0000 mg | PREFILLED_SYRINGE | Freq: Once | INTRAVENOUS | Status: AC
  Administered 2015-05-12: 70 mg via INTRAVENOUS

## 2015-05-12 MED ORDER — ESMOLOL HCL-SODIUM CHLORIDE 2000 MG/100ML IV SOLN
10.0000 mg | Freq: Once | INTRAVENOUS | Status: AC
  Administered 2015-05-12: 10000 ug via INTRAVENOUS

## 2015-05-12 MED ORDER — LIDOCAINE HCL (CARDIAC) 20 MG/ML IV SOLN
4.0000 mg | Freq: Once | INTRAVENOUS | Status: AC
  Administered 2015-05-12: 4 mg via INTRAVENOUS

## 2015-05-12 MED ORDER — SUCCINYLCHOLINE CHLORIDE 20 MG/ML IJ SOLN
100.0000 mg | Freq: Once | INTRAMUSCULAR | Status: AC
  Administered 2015-05-12: 100 mg via INTRAVENOUS

## 2015-05-12 MED ORDER — KETOROLAC TROMETHAMINE 30 MG/ML IJ SOLN
30.0000 mg | Freq: Once | INTRAMUSCULAR | Status: AC
  Administered 2015-05-12: 30 mg via INTRAVENOUS

## 2015-05-12 NOTE — Transfer of Care (Signed)
Immediate Anesthesia Transfer of Care Note  Patient: Melinda [name]  Procedure(s) Performed: * No procedures listed *  Patient Location: PACU  Anesthesia Type:General  Level of Consciousness: Alert, Awake, Oriented  Airway & Oxygen Therapy: Patient Spontanous Breathing  Post-op Assessment: Report given to RN  Post vital signs: Reviewed and stable  Last Vitals:  Filed Vitals:   05/12/15 1034  BP: 147/83  Pulse: 92  Temp: 37.6 C  Resp: 16    Complications: No apparent anesthesia complications

## 2015-05-12 NOTE — Procedures (Signed)
ECT SERVICES Physician's Interval Evaluation & Treatment Note  Patient Identification: Melinda [name] MRN:  [id number] Date of Evaluation:  05/12/2015 TX #: [id number]  MADRS:   MMSE:   P.E. Findings:  No change to physical exam no new injuries  Psychiatric Interval Note:  Chronic depression no worse than usual no suicidal ideation  Subjective:  Patient is a 44 y.o. female seen for evaluation for Electroconvulsive Therapy. Patient is feeling relatively stable although not any different than her normal baseline  Treatment Summary:   []   Right Unilateral             [x]  Bilateral   % Energy : 1.0 ms 35%   Impedance: 1130 ohms  Seizure Energy Index: 1830 V squared  Postictal Suppression Index: 91%  Seizure Concordance Index: 80%  Medications  Pre Shock: Xylocaine 4 mg, Toradol 30 mg, labetalol 20 mg, Brevital 70 mg, esmolol 10 mg, succinylcholine 100 mg  Post Shock:    Seizure Duration: 28 seconds by EMG, 33 seconds by EEG   Comments: Follow-up 2 weeks due to staffing and vacation time. Patient is aware of the plan Lungs:  [x]   Clear to auscultation               []  Other:   Heart:    [x]   Regular rhythm             []  irregular rhythm    [x]   Previous H&P reviewed, patient examined and there are NO CHANGES                 []   Previous H&P reviewed, patient examined and there are changes noted.   [name], MD 7/1/201610:15 AM

## 2015-05-12 NOTE — Anesthesia Postprocedure Evaluation (Signed)
  Anesthesia Post-op Note  Patient: Melinda [name]  Procedure(s) Performed: * No procedures listed *  Anesthesia type:General  Patient location: PACU  Post pain: Pain level controlled  Post assessment: Post-op Vital signs reviewed, Patient's Cardiovascular Status Stable, Respiratory Function Stable, Patent Airway and No signs of Nausea or vomiting  Post vital signs: Reviewed and stable  Last Vitals:  Filed Vitals:   05/12/15 1035  BP: 147/83  Pulse: 91  Temp: 37.6 C  Resp: 20    Level of consciousness: awake, alert  and patient cooperative  Complications: No apparent anesthesia complications

## 2015-05-12 NOTE — Anesthesia Preprocedure Evaluation (Signed)
Anesthesia Evaluation  Patient identified by MRN, date of birth, ID band Patient awake    Reviewed: Allergy & Precautions  Airway Mallampati: III       Dental no notable dental hx.    Pulmonary sleep apnea ,    + decreased breath sounds      Cardiovascular hypertension, Pt. on medications Rhythm:Regular Rate:Normal     Neuro/Psych Depression Bipolar Disorder  Neuromuscular disease    GI/Hepatic   Endo/Other  diabetes, Type 2, Oral Hypoglycemic Agents  Renal/GU   negative genitourinary   Musculoskeletal negative musculoskeletal ROS (+)   Abdominal (+) + obese,   Peds negative pediatric ROS (+)  Hematology negative hematology ROS (+)   Anesthesia Other Findings   Reproductive/Obstetrics                             Anesthesia Physical Anesthesia Plan  ASA: III  Anesthesia Plan: General   Post-op Pain Management:    Induction: Intravenous  Airway Management Planned: Mask  Additional Equipment:   Intra-op Plan:   Post-operative Plan:   Informed Consent: I have reviewed the patients History and Physical, chart, labs and discussed the procedure including the risks, benefits and alternatives for the proposed anesthesia with the patient or authorized representative who has indicated his/her understanding and acceptance.     Plan Discussed with: CRNA  Anesthesia Plan Comments:         Anesthesia Quick Evaluation

## 2015-05-24 ENCOUNTER — Other Ambulatory Visit: Payer: Self-pay | Admitting: *Deleted

## 2015-05-26 ENCOUNTER — Ambulatory Visit
Admission: RE | Admit: 2015-05-26 | Discharge: 2015-05-26 | Disposition: A | Discharge status: Home / Self Care | Payer: Medicare PPO | Source: Ambulatory Visit | Attending: Psychiatry | Admitting: Psychiatry

## 2015-05-26 ENCOUNTER — Encounter: Payer: Self-pay | Admitting: Anesthesiology

## 2015-05-26 DIAGNOSIS — I10 Essential (primary) hypertension: Secondary | ICD-10-CM | POA: Diagnosis not present

## 2015-05-26 DIAGNOSIS — F332 Major depressive disorder, recurrent severe without psychotic features: Secondary | ICD-10-CM | POA: Insufficient documentation

## 2015-05-26 DIAGNOSIS — E119 Type 2 diabetes mellitus without complications: Secondary | ICD-10-CM | POA: Diagnosis not present

## 2015-05-26 DIAGNOSIS — G4730 Sleep apnea, unspecified: Secondary | ICD-10-CM | POA: Diagnosis not present

## 2015-05-26 LAB — POCT PREGNANCY, URINE: Preg Test, Ur: NEGATIVE

## 2015-05-26 LAB — GLUCOSE, CAPILLARY: Glucose-Capillary: 93 mg/dL (ref 65–99)

## 2015-05-26 MED ORDER — LIDOCAINE HCL (CARDIAC) 20 MG/ML IV SOLN
4.0000 mg | Freq: Once | INTRAVENOUS | Status: AC
  Administered 2015-05-26: 4 mg via INTRAVENOUS

## 2015-05-26 MED ORDER — FENTANYL CITRATE (PF) 100 MCG/2ML IJ SOLN: 25.0000 ug | INTRAMUSCULAR | Status: DC | PRN

## 2015-05-26 MED ORDER — ESMOLOL HCL-SODIUM CHLORIDE 2000 MG/100ML IV SOLN
10.0000 mg | Freq: Once | INTRAVENOUS | Status: AC
  Administered 2015-05-26: 10000 ug via INTRAVENOUS

## 2015-05-26 MED ORDER — METHOHEXITAL SODIUM 100 MG/10ML IV SOSY
70.0000 mg | PREFILLED_SYRINGE | Freq: Once | INTRAVENOUS | Status: AC
  Administered 2015-05-26: 70 mg via INTRAVENOUS

## 2015-05-26 MED ORDER — KETOROLAC TROMETHAMINE 30 MG/ML IJ SOLN
30.0000 mg | Freq: Once | INTRAMUSCULAR | Status: AC
  Administered 2015-05-26: 30 mg via INTRAVENOUS

## 2015-05-26 MED ORDER — GLYCOPYRROLATE 0.2 MG/ML IJ SOLN
0.4000 mg | Freq: Once | INTRAMUSCULAR | Status: AC
  Administered 2015-05-26: 0.4 mg via INTRAVENOUS

## 2015-05-26 MED ORDER — LABETALOL HCL 5 MG/ML IV SOLN
20.0000 mg | Freq: Once | INTRAVENOUS | Status: AC
  Administered 2015-05-26: 20 mg via INTRAVENOUS

## 2015-05-26 MED ORDER — SUCCINYLCHOLINE CHLORIDE 20 MG/ML IJ SOLN
100.0000 mg | Freq: Once | INTRAMUSCULAR | Status: AC
  Administered 2015-05-26: 100 mg via INTRAVENOUS

## 2015-05-26 MED ORDER — SODIUM CHLORIDE 0.9 % IV SOLN
250.0000 mL | Freq: Once | INTRAVENOUS | Status: AC
  Administered 2015-05-26: 250 mL via INTRAVENOUS

## 2015-05-26 MED ORDER — ONDANSETRON HCL 4 MG/2ML IJ SOLN: 4.0000 mg | Freq: Once | INTRAMUSCULAR | Status: AC | PRN

## 2015-05-26 NOTE — Transfer of Care (Signed)
Immediate Anesthesia Transfer of Care Note  Patient: Melinda [name]  Procedure(s) Performed;ECT  Patient Location: PACU  Anesthesia Type:General  Level of Consciousness: awake and patient cooperative  Airway & Oxygen Therapy: Patient Spontanous Breathing and Patient connected to face mask oxygen  Post-op Assessment: Report given to RN and Post -op Vital signs reviewed and stable  Post vital signs: Reviewed and stable  Last Vitals:  Filed Vitals:   05/26/15 1050  BP: 137/91  Pulse: 98  Temp: 37.6 C  Resp: 18    Complications: No apparent anesthesia complications

## 2015-05-26 NOTE — Anesthesia Postprocedure Evaluation (Signed)
  Anesthesia Post-op Note  Patient: Melinda [name]  Procedure(s) Performed: * No procedures listed *  Anesthesia type:General  Patient location: PACU  Post pain: Pain level controlled  Post assessment: Post-op Vital signs reviewed, Patient's Cardiovascular Status Stable, Respiratory Function Stable, Patent Airway and No signs of Nausea or vomiting  Post vital signs: Reviewed and stable  Last Vitals:  Filed Vitals:   05/26/15 1132  BP: 100/66  Pulse: 88  Temp:   Resp: 18    Level of consciousness: awake, alert  and patient cooperative  Complications: No apparent anesthesia complications

## 2015-05-26 NOTE — Anesthesia Preprocedure Evaluation (Signed)
Anesthesia Evaluation  Patient identified by MRN, date of birth, ID band Patient awake    Reviewed: Allergy & Precautions, NPO status , Patient's Chart, lab work & pertinent test results  Airway Mallampati: II  TM Distance: >3 FB Neck ROM: Full    Dental  (+) Teeth Intact   Pulmonary sleep apnea ,    Pulmonary exam normal       Cardiovascular hypertension, Pt. on medications Normal cardiovascular exam    Neuro/Psych PSYCHIATRIC DISORDERS Depression Bipolar Disorder Peripheral neuropathy--diabetic.  Neuromuscular disease    GI/Hepatic GERD-  ,  Endo/Other  diabetes, Type 2  Renal/GU      Musculoskeletal   Abdominal (+) + obese,  Abdomen: soft.    Peds  Hematology   Anesthesia Other Findings   Reproductive/Obstetrics                             Anesthesia Physical  Anesthesia Plan  ASA: III  Anesthesia Plan: General   Post-op Pain Management:    Induction: Intravenous  Airway Management Planned: Mask  Additional Equipment:   Intra-op Plan:   Post-operative Plan:   Informed Consent: I have reviewed the patients History and Physical, chart, labs and discussed the procedure including the risks, benefits and alternatives for the proposed anesthesia with the patient or authorized representative who has indicated his/her understanding and acceptance.     Plan Discussed with: CRNA  Anesthesia Plan Comments:         Anesthesia Quick Evaluation

## 2015-05-26 NOTE — Anesthesia Procedure Notes (Signed)
Date/Time: 05/26/2015 10:34 AM Performed by: Dionne Bucy Pre-anesthesia Checklist: Patient identified and Timeout performed Patient Re-evaluated:Patient Re-evaluated prior to inductionOxygen Delivery Method: Circle system utilized Preoxygenation: Pre-oxygenation with 100% oxygen Ventilation: Mask ventilation throughout procedure Airway Equipment and Method: Bite block Placement Confirmation: positive ETCO2

## 2015-05-26 NOTE — Procedures (Signed)
ECT SERVICES Physician's Interval Evaluation & Treatment Note  Patient Identification: Melinda [name] MRN:  [id number] Date of Evaluation:  05/26/2015 TX #: [id number]  MADRS: 17  MMSE: 29  P.E. Findings:  Has some self-inflicted minor scratches on her upper arms. No treatment needed. No other physical findings.  Psychiatric Interval Note:  Mood is slightly more down but no suicidal ideation currently no psychosis.  Subjective:  Patient is a 44 y.o. female seen for evaluation for Electroconvulsive Therapy. Patient continues to have chronic depression and strongly feels that she prefers to have weekly ECT.  Treatment Summary:   []   Right Unilateral             [x]  Bilateral   % Energy : 1.0 ms, 35%   Impedance: 800 ohms  Seizure Energy Index: 4841 V squared  Postictal Suppression Index: 77%  Seizure Concordance Index: 96%  Medications  Pre Shock: Robinul 0.4 mg, Xylocaine 4 mg, labetalol 20 mg, Toradol 30 mg, esmolol 10 mg, Brevital 70 mg, succinylcholine 100 mg  Post Shock: None  Seizure Duration: 40 seconds by EMG, 76 seconds by EEG   Comments: Follow-up one week   Lungs:  [x]   Clear to auscultation               []  Other:   Heart:    [x]   Regular rhythm             []  irregular rhythm    [x]   Previous H&P reviewed, patient examined and there are NO CHANGES                 []   Previous H&P reviewed, patient examined and there are changes noted.   [name], MD 7/15/201610:41 AM

## 2015-05-31 ENCOUNTER — Other Ambulatory Visit: Payer: Self-pay

## 2015-06-02 ENCOUNTER — Encounter: Payer: Self-pay | Admitting: Anesthesiology

## 2015-06-02 ENCOUNTER — Ambulatory Visit
Admission: RE | Admit: 2015-06-02 | Discharge: 2015-06-02 | Disposition: A | Discharge status: Home / Self Care | Payer: Medicare PPO | Source: Ambulatory Visit | Attending: Psychiatry | Admitting: Psychiatry

## 2015-06-02 ENCOUNTER — Encounter: Payer: Medicare PPO | Admitting: Anesthesiology

## 2015-06-02 DIAGNOSIS — F332 Major depressive disorder, recurrent severe without psychotic features: Secondary | ICD-10-CM | POA: Insufficient documentation

## 2015-06-02 DIAGNOSIS — E119 Type 2 diabetes mellitus without complications: Secondary | ICD-10-CM | POA: Diagnosis not present

## 2015-06-02 DIAGNOSIS — I10 Essential (primary) hypertension: Secondary | ICD-10-CM | POA: Diagnosis not present

## 2015-06-02 LAB — GLUCOSE, CAPILLARY: Glucose-Capillary: 90 mg/dL (ref 65–99)

## 2015-06-02 LAB — POCT PREGNANCY, URINE: Preg Test, Ur: NEGATIVE

## 2015-06-02 MED ORDER — SODIUM CHLORIDE 0.9 % IV SOLN
INTRAVENOUS | Status: DC | PRN
  Administered 2015-06-02: 10:00:00 via INTRAVENOUS

## 2015-06-02 MED ORDER — GLYCOPYRROLATE 0.2 MG/ML IJ SOLN
0.4000 mg | Freq: Once | INTRAMUSCULAR | Status: AC
  Administered 2015-06-02: 0.4 mg via INTRAVENOUS

## 2015-06-02 MED ORDER — DEXTROSE IN LACTATED RINGERS 5 % IV SOLN: INTRAVENOUS | Status: DC | PRN

## 2015-06-02 MED ORDER — ESMOLOL HCL-SODIUM CHLORIDE 2000 MG/100ML IV SOLN
10.0000 mg | Freq: Once | INTRAVENOUS | Status: AC
  Administered 2015-06-02: 10000 ug via INTRAVENOUS

## 2015-06-02 MED ORDER — SUCCINYLCHOLINE CHLORIDE 20 MG/ML IJ SOLN
100.0000 mg | Freq: Once | INTRAMUSCULAR | Status: AC
  Administered 2015-06-02: 100 mg via INTRAVENOUS

## 2015-06-02 MED ORDER — LIDOCAINE HCL (CARDIAC) 20 MG/ML IV SOLN
4.0000 mg | Freq: Once | INTRAVENOUS | Status: AC
  Administered 2015-06-02: 4 mg via INTRAVENOUS

## 2015-06-02 MED ORDER — SODIUM CHLORIDE 0.9 % IV SOLN
250.0000 mL | Freq: Once | INTRAVENOUS | Status: AC
  Administered 2015-06-02: 250 mL via INTRAVENOUS

## 2015-06-02 MED ORDER — KETOROLAC TROMETHAMINE 30 MG/ML IJ SOLN
30.0000 mg | Freq: Once | INTRAMUSCULAR | Status: AC
  Administered 2015-06-02: 30 mg via INTRAVENOUS

## 2015-06-02 MED ORDER — LABETALOL HCL 5 MG/ML IV SOLN
20.0000 mg | Freq: Once | INTRAVENOUS | Status: AC
  Administered 2015-06-02: 20 mg via INTRAVENOUS

## 2015-06-02 MED ORDER — METHOHEXITAL SODIUM 100 MG/10ML IV SOSY
70.0000 mg | PREFILLED_SYRINGE | Freq: Once | INTRAVENOUS | Status: AC
  Administered 2015-06-02: 70 mg via INTRAVENOUS

## 2015-06-02 NOTE — Transfer of Care (Signed)
Immediate Anesthesia Transfer of Care Note  Patient: Melinda [name]  Procedure(s) Performed: * No procedures listed *  Patient Location: PACU  Anesthesia Type:General  Level of Consciousness: awake, alert  and oriented  Airway & Oxygen Therapy: Patient Spontanous Breathing and Patient connected to face mask oxygen  Post-op Assessment: Report given to RN and Post -op Vital signs reviewed and stable  Post vital signs: Reviewed and stable  Last Vitals:  Filed Vitals:   06/02/15 1038  BP: 128/68  Pulse: 98  Temp: 37.6 C  Resp: 21    Complications: No apparent anesthesia complications

## 2015-06-02 NOTE — Anesthesia Postprocedure Evaluation (Signed)
  Anesthesia Post-op Note  Patient: Melinda [name]  Procedure(s) Performed: * No procedures listed *  Anesthesia type:General  Patient location: PACU  Post pain: Pain level controlled  Post assessment: Post-op Vital signs reviewed, Patient's Cardiovascular Status Stable, Respiratory Function Stable, Patent Airway and No signs of Nausea or vomiting  Post vital signs: Reviewed and stable  Last Vitals:  Filed Vitals:   06/02/15 1119  BP: 131/45  Pulse: 87  Temp:   Resp: 18    Level of consciousness: awake, alert  and patient cooperative  Complications: No apparent anesthesia complications

## 2015-06-02 NOTE — Anesthesia Preprocedure Evaluation (Signed)
Anesthesia Evaluation  Patient identified by MRN, date of birth, ID band Patient awake    Reviewed: Allergy & Precautions, NPO status , Patient's Chart, lab work & pertinent test results  Airway Mallampati: III  TM Distance: >3 FB Neck ROM: Full    Dental  (+) Teeth Intact, Chipped   Pulmonary sleep apnea ,    Pulmonary exam normal       Cardiovascular hypertension, Pt. on medications +CHF Normal cardiovascular exam    Neuro/Psych PSYCHIATRIC DISORDERS Depression Bipolar Disorder Peripheral neuropathy--diabetic.  Neuromuscular disease    GI/Hepatic GERD-  ,  Endo/Other  diabetes, Type 2  Renal/GU      Musculoskeletal   Abdominal (+) + obese,  Abdomen: soft.    Peds  Hematology   Anesthesia Other Findings   Reproductive/Obstetrics                             Anesthesia Physical Anesthesia Plan  ASA: III  Anesthesia Plan: General   Post-op Pain Management:    Induction: Intravenous  Airway Management Planned: Mask  Additional Equipment:   Intra-op Plan:   Post-operative Plan:   Informed Consent: I have reviewed the patients History and Physical, chart, labs and discussed the procedure including the risks, benefits and alternatives for the proposed anesthesia with the patient or authorized representative who has indicated his/her understanding and acceptance.     Plan Discussed with: CRNA  Anesthesia Plan Comments:         Anesthesia Quick Evaluation

## 2015-06-02 NOTE — Procedures (Signed)
ECT SERVICES Physician's Interval Evaluation & Treatment Note  Patient Identification: Melinda [name] MRN:  [id number] Date of Evaluation:  06/02/2015 TX #: [id number]  MADRS:   MMSE:   P.E. Findings:  No change. No new physical complaints or findings.  Psychiatric Interval Note:  Mood fairly stable. Chronic complaints of anxiety. No recent self injury  Subjective:  Patient is a 44 y.o. female seen for evaluation for Electroconvulsive Therapy. Patient is feeling relatively well continues to feel like she is getting stabilizing benefit from ECT  Treatment Summary:   []   Right Unilateral             [x]  Bilateral   % Energy : 1.0 ms 35%   Impedance: 780 ohms  Seizure Energy Index: 1406 V squared  Postictal Suppression Index: 92%  Seizure Concordance Index: 91%  Medications  Pre Shock: Robinul 4 mg, Xylocaine 4 mg, Toradol 30 mg, labetalol 20 mg, esmolol 10 mg, succinylcholine 100 mg, Brevital 70 mg  Post Shock: None  Seizure Duration: 38 seconds by EMG, 46 seconds by EEG   Comments: Follow-up one week   Lungs:  [x]   Clear to auscultation               []  Other:   Heart:    []   Regular rhythm             []  irregular rhythm    []   Previous H&P reviewed, patient examined and there are NO CHANGES                 []   Previous H&P reviewed, patient examined and there are changes noted.   [name], MD 7/22/201610:33 AM

## 2015-06-02 NOTE — Anesthesia Procedure Notes (Signed)
Performed by: Spike Desilets Pre-anesthesia Checklist: Patient identified, Patient being monitored, Timeout performed, Emergency Drugs available and Suction available Patient Re-evaluated:Patient Re-evaluated prior to inductionOxygen Delivery Method: Circle system utilized Preoxygenation: Pre-oxygenation with 100% oxygen Ventilation: Mask ventilation without difficulty Airway Equipment and Method: Bite block Dental Injury: Teeth and Oropharynx as per pre-operative assessment        

## 2015-06-05 DIAGNOSIS — F251 Schizoaffective disorder, depressive type: Secondary | ICD-10-CM | POA: Diagnosis not present

## 2015-06-07 ENCOUNTER — Other Ambulatory Visit: Payer: Self-pay

## 2015-06-09 ENCOUNTER — Encounter: Payer: Self-pay | Admitting: Anesthesiology

## 2015-06-09 ENCOUNTER — Encounter
Admission: RE | Admit: 2015-06-09 | Discharge: 2015-06-09 | Disposition: A | Discharge status: Home / Self Care | Payer: Medicare PPO | Source: Ambulatory Visit | Attending: Psychiatry | Admitting: Psychiatry

## 2015-06-09 DIAGNOSIS — Z6841 Body Mass Index (BMI) 40.0 and over, adult: Secondary | ICD-10-CM | POA: Diagnosis not present

## 2015-06-09 DIAGNOSIS — F332 Major depressive disorder, recurrent severe without psychotic features: Secondary | ICD-10-CM | POA: Diagnosis not present

## 2015-06-09 DIAGNOSIS — E669 Obesity, unspecified: Secondary | ICD-10-CM | POA: Diagnosis not present

## 2015-06-09 LAB — GLUCOSE, CAPILLARY: Glucose-Capillary: 91 mg/dL (ref 65–99)

## 2015-06-09 LAB — POCT PREGNANCY, URINE: Preg Test, Ur: NEGATIVE

## 2015-06-09 MED ORDER — ONDANSETRON HCL 4 MG/2ML IJ SOLN: 4.0000 mg | Freq: Once | INTRAMUSCULAR | Status: AC | PRN

## 2015-06-09 MED ORDER — SUCCINYLCHOLINE CHLORIDE 20 MG/ML IJ SOLN
100.0000 mg | Freq: Once | INTRAMUSCULAR | Status: AC
  Administered 2015-06-09: 100 mg via INTRAVENOUS

## 2015-06-09 MED ORDER — GLYCOPYRROLATE 0.2 MG/ML IJ SOLN
0.4000 mg | Freq: Once | INTRAMUSCULAR | Status: AC
  Administered 2015-06-09: 0.4 mg via INTRAVENOUS

## 2015-06-09 MED ORDER — LIDOCAINE HCL (CARDIAC) 20 MG/ML IV SOLN
4.0000 mg | Freq: Once | INTRAVENOUS | Status: AC
  Administered 2015-06-09: 4 mg via INTRAVENOUS

## 2015-06-09 MED ORDER — KETOROLAC TROMETHAMINE 30 MG/ML IJ SOLN
30.0000 mg | Freq: Once | INTRAMUSCULAR | Status: AC
  Administered 2015-06-09: 30 mg via INTRAVENOUS

## 2015-06-09 MED ORDER — FENTANYL CITRATE (PF) 100 MCG/2ML IJ SOLN: 25.0000 ug | INTRAMUSCULAR | Status: DC | PRN

## 2015-06-09 MED ORDER — ESMOLOL HCL-SODIUM CHLORIDE 2000 MG/100ML IV SOLN
10.0000 mg | Freq: Once | INTRAVENOUS | Status: AC
  Administered 2015-06-09: 10000 ug via INTRAVENOUS

## 2015-06-09 MED ORDER — LABETALOL HCL 5 MG/ML IV SOLN
20.0000 mg | Freq: Once | INTRAVENOUS | Status: AC
  Administered 2015-06-09: 20 mg via INTRAVENOUS

## 2015-06-09 MED ORDER — SODIUM CHLORIDE 0.9 % IV SOLN
250.0000 mL | Freq: Once | INTRAVENOUS | Status: AC
  Administered 2015-06-09: 250 mL via INTRAVENOUS

## 2015-06-09 MED ORDER — METHOHEXITAL SODIUM 100 MG/10ML IV SOSY
70.0000 mg | PREFILLED_SYRINGE | Freq: Once | INTRAVENOUS | Status: AC
  Administered 2015-06-09: 70 mg via INTRAVENOUS

## 2015-06-09 NOTE — Anesthesia Preprocedure Evaluation (Signed)
Anesthesia Evaluation  Patient identified by MRN, date of birth, ID band Patient awake    Reviewed: Allergy & Precautions, NPO status , Patient's Chart, lab work & pertinent test results  Airway Mallampati: III  TM Distance: >3 FB Neck ROM: Full    Dental  (+) Teeth Intact, Chipped   Pulmonary sleep apnea ,    Pulmonary exam normal       Cardiovascular hypertension, Pt. on medications +CHF Normal cardiovascular exam    Neuro/Psych PSYCHIATRIC DISORDERS Depression Bipolar Disorder Peripheral neuropathy--diabetic.  Neuromuscular disease    GI/Hepatic GERD-  ,  Endo/Other  diabetes, Type 2  Renal/GU      Musculoskeletal   Abdominal (+) + obese,  Abdomen: soft.    Peds  Hematology   Anesthesia Other Findings   Reproductive/Obstetrics                             Anesthesia Physical Anesthesia Plan  ASA: III  Anesthesia Plan: General   Post-op Pain Management:    Induction: Intravenous  Airway Management Planned: Mask  Additional Equipment:   Intra-op Plan:   Post-operative Plan:   Informed Consent: I have reviewed the patients History and Physical, chart, labs and discussed the procedure including the risks, benefits and alternatives for the proposed anesthesia with the patient or authorized representative who has indicated his/her understanding and acceptance.     Plan Discussed with:   Anesthesia Plan Comments:         Anesthesia Quick Evaluation

## 2015-06-09 NOTE — Anesthesia Postprocedure Evaluation (Signed)
  Anesthesia Post-op Note  Patient: Melinda [name]  Procedure(s) Performed: * No procedures listed *  Anesthesia type:General  Patient location: PACU  Post pain: Pain level controlled  Post assessment: Post-op Vital signs reviewed, Patient's Cardiovascular Status Stable, Respiratory Function Stable, Patent Airway and No signs of Nausea or vomiting  Post vital signs: Reviewed and stable  Last Vitals:  Filed Vitals:   06/09/15 1040  BP: 127/58  Pulse: 97  Temp:   Resp: 30    Level of consciousness: awake, alert  and patient cooperative  Complications: No apparent anesthesia complications

## 2015-06-09 NOTE — Procedures (Signed)
ECT SERVICES Physician's Interval Evaluation & Treatment Note  Patient Identification: Melinda [name] MRN:  [id number] Date of Evaluation:  06/09/2015 TX #: [id number]  MADRS:   MMSE:   P.E. Findings:  No new findings no changes  Psychiatric Interval Note:  Mood continues to be chronically mildly down. Recently saw her psychiatrist. He recommended that she be seeing a therapist. Patient is resistant.  Subjective:  Patient is a 44 y.o. female seen for evaluation for Electroconvulsive Therapy. Continues to get subjective and objective benefit from ongoing ECT treatment  Treatment Summary:   []   Right Unilateral             [x]  Bilateral   % Energy : 1.0 ms 35%   Impedance: 770 ohms  Seizure Energy Index: 5123 V squared  Postictal Suppression Index: 37%  Seizure Concordance Index: 90%  Medications  Pre Shock: Robinul 0.4 mg, Toradol 30 mg, Xylocaine 4 mg, labetalol 20 mg, Brevital 70 mg, esmolol 10 mg, succinylcholine 100 mg  Post Shock: None  Seizure Duration: 34 seconds by EMG, 65 seconds by EEG   Comments: Follow-up one week   Lungs:  [x]   Clear to auscultation               []  Other:   Heart:    [x]   Regular rhythm             []  irregular rhythm    [x]   Previous H&P reviewed, patient examined and there are NO CHANGES                 []   Previous H&P reviewed, patient examined and there are changes noted.   [name], MD 7/29/201610:10 AM

## 2015-06-09 NOTE — Transfer of Care (Signed)
Immediate Anesthesia Transfer of Care Note  Patient: Melinda [name]  Procedure(s) Performed: ECT  Patient Location: PACU  Anesthesia Type:General  Level of Consciousness: awake and patient cooperative  Airway & Oxygen Therapy: Patient Spontanous Breathing and Patient connected to face mask oxygen  Post-op Assessment: Report given to RN  Post vital signs: Reviewed and stable  Last Vitals:  Filed Vitals:   06/09/15 1030  BP: 124/99  Pulse: 95  Temp:   Resp: 23    Complications: No apparent anesthesia complications

## 2015-06-09 NOTE — Anesthesia Procedure Notes (Signed)
Date/Time: 06/09/2015 10:17 AM Performed by: Dionne Bucy Pre-anesthesia Checklist: Patient identified and Timeout performed Patient Re-evaluated:Patient Re-evaluated prior to inductionOxygen Delivery Method: Circle system utilized Preoxygenation: Pre-oxygenation with 100% oxygen Intubation Type: IV induction Ventilation: Mask ventilation without difficulty and Mask ventilation throughout procedure Airway Equipment and Method: Bite block Placement Confirmation: positive ETCO2 Dental Injury: Teeth and Oropharynx as per pre-operative assessment

## 2015-06-13 DIAGNOSIS — E119 Type 2 diabetes mellitus without complications: Secondary | ICD-10-CM | POA: Diagnosis not present

## 2015-06-14 ENCOUNTER — Other Ambulatory Visit: Payer: Self-pay

## 2015-06-14 ENCOUNTER — Encounter: Payer: Self-pay | Admitting: Family Medicine

## 2015-06-14 ENCOUNTER — Ambulatory Visit (INDEPENDENT_AMBULATORY_CARE_PROVIDER_SITE_OTHER): Payer: Medicare PPO | Admitting: Family Medicine

## 2015-06-14 VITALS — BP 116/82 | HR 84 | Temp 98.2°F | Resp 16 | Ht 63.0 in | Wt 246.0 lb

## 2015-06-14 DIAGNOSIS — Z0000 Encounter for general adult medical examination without abnormal findings: Secondary | ICD-10-CM

## 2015-06-14 NOTE — Progress Notes (Signed)
Patient ID: CECILA [name], female   DOB: 1970-12-10, 44 y.o.   MRN: [id number]        Patient: Melinda [name], Female    DOB: 1971-08-17, 44 y.o.   MRN: [id number] Visit Date: 06/14/2015  Today's Provider: Margarita Rana, MD   Chief Complaint  Patient presents with  . Annual Exam   Subjective:    Annual wellness visit Melinda [name] is a 44 y.o. female who presents today for her Subsequent Annual Wellness Visit. She feels fairly well.  Pt reports having diarrhea occasionally after eating certain foods.  She reports it has been going on for a "long time".   She reports not exercising much. She reports she is sleeping fairly well.  Pt reports sleeping during the day and is awake at night.  She says she feels fine with that and has good energy.    Sugars doing very well.  -----------------------------------------------------------   Review of Systems  Constitutional: Negative.   HENT: Negative.   Eyes: Negative.   Respiratory: Negative.   Cardiovascular: Negative.   Gastrointestinal: Positive for diarrhea (Occasionally).  Endocrine: Negative.   Genitourinary: Negative.   Musculoskeletal: Negative.   Skin: Negative.   Allergic/Immunologic: Negative.   Neurological: Negative.   Hematological: Negative.   Psychiatric/Behavioral: Negative.     History   Social History  . Marital Status: Single    Spouse Name: N/A  . Number of Children: 1  . Years of Education: N/A   Occupational History  . Disabled    Social History Main Topics  . Smoking status: Never Smoker   . Smokeless tobacco: Never Used  . Alcohol Use: No  . Drug Use: No  . Sexual Activity: No   Other Topics Concern  . Not on file   Social History Narrative    Patient Active Problem List   Diagnosis Date Noted  . Affective bipolar disorder 04/28/2015  . Cardiomyopathy 04/28/2015  . Diabetes mellitus, type 2 04/28/2015  . Hypercholesteremia 04/28/2015  . BP (high blood pressure) 04/28/2015  .  Extreme obesity 04/28/2015  . Disorder of peripheral nervous system 04/28/2015  . Severe depression 04/28/2015  . Apnea, sleep 04/28/2015  . Attempted suicide 04/28/2015  . Severe recurrent major depression without psychotic features   . DIABETES MELLITUS, TYPE II 09/09/2007  . DEPRESSION 09/09/2007  . PERIPHERAL NEUROPATHY 09/09/2007  . HYPERTENSION 09/09/2007  . GERD 09/09/2007    Past Surgical History  Procedure Laterality Date  . Electroconvulsion therapy  03/07/15    Her family history includes Diabetes in her mother; Hypertension in her father.    Previous Medications   ATORVASTATIN (LIPITOR) 10 MG TABLET    Take 10 mg by mouth at bedtime.   BUPROPION (WELLBUTRIN XL) 300 MG 24 HR TABLET    Take 300 mg by mouth daily.   ESCITALOPRAM (LEXAPRO) 20 MG TABLET    Take 20 mg by mouth daily.   GLUCOSE BLOOD TEST STRIP       LANCETS MISC. (ACCU-CHEK FASTCLIX LANCET) KIT       LISINOPRIL (PRINIVIL,ZESTRIL) 10 MG TABLET    Take 10 mg by mouth daily.   PIOGLITAZONE (ACTOS) 30 MG TABLET    Take 30 mg by mouth daily.   ZIPRASIDONE (GEODON) 80 MG CAPSULE    Take 80 mg by mouth 2 (two) times daily with a meal.    Patient Care Team: Margarita Rana, MD as PCP - General (Family Medicine)     Objective:   Vitals:  BP 116/82 mmHg  Pulse 84  Temp(Src) 98.2 F (36.8 C) (Oral)  Resp 16  Ht 5' 3" (1.6 m)  Wt 246 lb (111.585 kg)  BMI 43.59 kg/m2  LMP 06/05/2015  Physical Exam  Constitutional: She is oriented to person, place, and time. She appears well-developed and well-nourished. She is active.  HENT:  Head: Normocephalic and atraumatic.  Right Ear: Hearing, tympanic membrane, external ear and ear canal normal.  Left Ear: Hearing, tympanic membrane, external ear and ear canal normal.  Nose: Nose normal.  Mouth/Throat: Uvula is midline, oropharynx is clear and moist and mucous membranes are normal.  Eyes: Conjunctivae, EOM and lids are normal. Pupils are equal, round, and reactive to  light. Lids are everted and swept, no foreign bodies found.  Neck: Trachea normal and normal range of motion. Carotid bruit is not present.  Cardiovascular: Normal rate, regular rhythm, normal heart sounds and normal pulses.   Pulmonary/Chest: Effort normal and breath sounds normal. Right breast exhibits no inverted nipple, no mass, no nipple discharge, no skin change and no tenderness. Left breast exhibits no inverted nipple, no mass, no nipple discharge, no skin change and no tenderness. Breasts are symmetrical.  Abdominal: Soft. Normal appearance, normal aorta and bowel sounds are normal.  Musculoskeletal: Normal range of motion.  Neurological: She is alert and oriented to person, place, and time.  Skin: Skin is warm, dry and intact.  Scars noted.   Psychiatric: Judgment normal. Her affect is not labile. She does not exhibit a depressed mood.    Activities of Daily Living In your present state of health, do you have any difficulty performing the following activities: 06/14/2015  Hearing? N  Vision? N  Difficulty concentrating or making decisions? Y  Walking or climbing stairs? N  Dressing or bathing? N  Doing errands, shopping? N    Fall Risk Assessment Fall Risk  06/14/2015  Falls in the past year? No     Depression Screen PHQ 2/9 Scores 06/14/2015  Exception Documentation Other- indicate reason in comment box  Not completed Pt has severe depression; has weekly ECT Treatments, and being followed very closely by a psychiatry.      Assessment & Plan:     Annual Wellness Visit  Reviewed patient's Family Medical History Reviewed and updated list of patient's medical providers Assessed patient's functional ability Established a written schedule for health screening [place] Completed and Reviewed  Exercise Activities and Dietary recommendations  Recommend increase activity. Call for mammogram, card given.     Immunization History  Administered  Date(s) Administered  . Hepatitis B 12/16/2013  . Pneumococcal Polysaccharide-23 12/16/2013    Health Maintenance  Topic Date Due  . HEMOGLOBIN A1C  Nov 11, 1969  . FOOT EXAM  01/01/1979  . OPHTHALMOLOGY EXAM  01/01/1979  . URINE MICROALBUMIN  01/01/1979  . HIV Screening  01/02/1984  . PAP SMEAR  01/01/1987  . TETANUS/TDAP  01/02/1988  . MAMMOGRAM  09/01/2008  . INFLUENZA VACCINE  06/12/2015  . PNEUMOCOCCAL POLYSACCHARIDE VACCINE (2) 12/16/2018      Discussed health benefits of physical activity, and encouraged her to engage in regular exercise appropriate for her age and condition.   Follow up with psychiatry as scheduled.    Patient was seen and examined by Jerrell Belfast, MD, and note scribed by Ashley Royalty, CMA.  I have reviewed the document for accuracy and completeness and I agree with above. Jerrell Belfast, MD   Margarita Rana, MD

## 2015-06-16 ENCOUNTER — Encounter
Admission: RE | Admit: 2015-06-16 | Discharge: 2015-06-16 | Disposition: A | Discharge status: Home / Self Care | Payer: Medicare PPO | Source: Ambulatory Visit | Attending: Psychiatry | Admitting: Psychiatry

## 2015-06-16 ENCOUNTER — Encounter: Payer: Self-pay | Admitting: Anesthesiology

## 2015-06-16 DIAGNOSIS — E119 Type 2 diabetes mellitus without complications: Secondary | ICD-10-CM | POA: Diagnosis not present

## 2015-06-16 DIAGNOSIS — F332 Major depressive disorder, recurrent severe without psychotic features: Secondary | ICD-10-CM | POA: Diagnosis not present

## 2015-06-16 DIAGNOSIS — F329 Major depressive disorder, single episode, unspecified: Secondary | ICD-10-CM | POA: Diagnosis not present

## 2015-06-16 LAB — POCT PREGNANCY, URINE: Preg Test, Ur: NEGATIVE

## 2015-06-16 LAB — GLUCOSE, CAPILLARY: Glucose-Capillary: 82 mg/dL (ref 65–99)

## 2015-06-16 MED ORDER — KETOROLAC TROMETHAMINE 30 MG/ML IJ SOLN
30.0000 mg | Freq: Once | INTRAMUSCULAR | Status: AC
  Administered 2015-06-16: 30 mg via INTRAVENOUS

## 2015-06-16 MED ORDER — ESMOLOL HCL-SODIUM CHLORIDE 2000 MG/100ML IV SOLN
10.0000 mg | Freq: Once | INTRAVENOUS | Status: AC
  Administered 2015-06-16: 10000 ug via INTRAVENOUS

## 2015-06-16 MED ORDER — METHOHEXITAL SODIUM 100 MG/10ML IV SOSY
70.0000 mg | PREFILLED_SYRINGE | Freq: Once | INTRAVENOUS | Status: AC
  Administered 2015-06-16: 70 mg via INTRAVENOUS

## 2015-06-16 MED ORDER — SODIUM CHLORIDE 0.9 % IV SOLN
250.0000 mL | Freq: Once | INTRAVENOUS | Status: AC
  Administered 2015-06-16: 250 mL via INTRAVENOUS

## 2015-06-16 MED ORDER — LABETALOL HCL 5 MG/ML IV SOLN
20.0000 mg | Freq: Once | INTRAVENOUS | Status: AC
  Administered 2015-06-16: 20 mg via INTRAVENOUS

## 2015-06-16 MED ORDER — LIDOCAINE HCL (CARDIAC) 20 MG/ML IV SOLN
4.0000 mg | Freq: Once | INTRAVENOUS | Status: AC
  Administered 2015-06-16: 4 mg via INTRAVENOUS

## 2015-06-16 MED ORDER — SUCCINYLCHOLINE CHLORIDE 20 MG/ML IJ SOLN
100.0000 mg | Freq: Once | INTRAMUSCULAR | Status: AC
  Administered 2015-06-16: 100 mg via INTRAVENOUS

## 2015-06-16 MED ORDER — GLYCOPYRROLATE 0.2 MG/ML IJ SOLN
0.4000 mg | Freq: Once | INTRAMUSCULAR | Status: AC
Start: 2015-06-16 — End: 2015-06-16
  Administered 2015-06-16: 0.4 mg via INTRAVENOUS

## 2015-06-16 NOTE — Procedures (Signed)
ECT SERVICES Physician's Interval Evaluation & Treatment Note  Patient Identification: Melinda [name] MRN:  [id number] Date of Evaluation:  06/16/2015 TX #: [id number]  MADRS:   MMSE:   P.E. Findings:  No new physical findings. Blood sugars are doing quite well recently  Psychiatric Interval Note:  No worsening of mood. In fact she is feeling pretty good this week  Subjective:  Patient is a 44 y.o. female seen for evaluation for Electroconvulsive Therapy. No new complaints  Treatment Summary:   []   Right Unilateral             [x]  Bilateral   % Energy : 1.0 ms, 35%   Impedance: 840 ohms  Seizure Energy Index: 7565 V squared  Postictal Suppression Index: 94%  Seizure Concordance Index: 98%  Medications  Pre Shock: Robinul 0.4 mg, Xylocaine 4 mg, labetalol 20 mg, Toradol 30 mg, Brevital 70 mg, esmolol 10 mg, succinylcholine 100 mg  Post Shock: None  Seizure Duration: 44 seconds by EMG, 87 seconds by EEG   Comments: Follow-up one week   Lungs:  [x]   Clear to auscultation               []  Other:   Heart:    [x]   Regular rhythm             []  irregular rhythm    [x]   Previous H&P reviewed, patient examined and there are NO CHANGES                 []   Previous H&P reviewed, patient examined and there are changes noted.   [name], MD 8/5/201611:21 AM

## 2015-06-16 NOTE — Anesthesia Postprocedure Evaluation (Signed)
  Anesthesia Post-op Note  Patient: Melinda [name]  Procedure(s) Performed: * No procedures listed *  Anesthesia type:General  Patient location: PACU  Post pain: Pain level controlled  Post assessment: Post-op Vital signs reviewed, Patient's Cardiovascular Status Stable, Respiratory Function Stable, Patent Airway and No signs of Nausea or vomiting  Post vital signs: Reviewed and stable  Last Vitals:  Filed Vitals:   06/16/15 1143  BP: 121/56  Pulse: 95  Temp: 37.5 C  Resp: 17    Level of consciousness: awake, alert  and patient cooperative  Complications: No apparent anesthesia complications

## 2015-06-16 NOTE — Anesthesia Preprocedure Evaluation (Signed)
Anesthesia Evaluation  Patient identified by MRN, date of birth, ID band Patient awake    Reviewed: Allergy & Precautions, NPO status , Patient's Chart, lab work & pertinent test results  Airway Mallampati: III  TM Distance: >3 FB Neck ROM: Full    Dental  (+) Teeth Intact   Pulmonary    Pulmonary exam normal       Cardiovascular hypertension, Pt. on medications Normal cardiovascular exam    Neuro/Psych Depression Bipolar Disorder    GI/Hepatic   Endo/Other  diabetes, Type 2  Renal/GU      Musculoskeletal   Abdominal   Peds  Hematology   Anesthesia Other Findings   Reproductive/Obstetrics                             Anesthesia Physical Anesthesia Plan  ASA: III  Anesthesia Plan: General   Post-op Pain Management:    Induction: Intravenous  Airway Management Planned: Mask  Additional Equipment:   Intra-op Plan:   Post-operative Plan:   Informed Consent: I have reviewed the patients History and Physical, chart, labs and discussed the procedure including the risks, benefits and alternatives for the proposed anesthesia with the patient or authorized representative who has indicated his/her understanding and acceptance.     Plan Discussed with: CRNA  Anesthesia Plan Comments:         Anesthesia Quick Evaluation

## 2015-06-16 NOTE — Transfer of Care (Signed)
Immediate Anesthesia Transfer of Care Note  Patient: Melinda [name]  Procedure(s) Performed: ECT  Patient Location: PACU  Anesthesia Type:General  Level of Consciousness: sedated  Airway & Oxygen Therapy: Patient Spontanous Breathing  Post-op Assessment: Report given to RN  Post vital signs: Reviewed  Last Vitals:  Filed Vitals:   06/16/15 1143  BP: 121/56  Pulse: 97  Temp: 62.8B    Complications: No apparent anesthesia complications

## 2015-06-19 ENCOUNTER — Other Ambulatory Visit: Payer: Self-pay | Admitting: *Deleted

## 2015-06-23 ENCOUNTER — Encounter (HOSPITAL_BASED_OUTPATIENT_CLINIC_OR_DEPARTMENT_OTHER)
Admission: RE | Admit: 2015-06-23 | Discharge: 2015-06-23 | Disposition: A | Discharge status: Home / Self Care | Payer: Medicare PPO | Source: Ambulatory Visit | Attending: Psychiatry | Admitting: Psychiatry

## 2015-06-23 ENCOUNTER — Encounter: Payer: Self-pay | Admitting: Anesthesiology

## 2015-06-23 DIAGNOSIS — F332 Major depressive disorder, recurrent severe without psychotic features: Secondary | ICD-10-CM

## 2015-06-23 DIAGNOSIS — F329 Major depressive disorder, single episode, unspecified: Secondary | ICD-10-CM | POA: Diagnosis not present

## 2015-06-23 DIAGNOSIS — E119 Type 2 diabetes mellitus without complications: Secondary | ICD-10-CM | POA: Diagnosis not present

## 2015-06-23 LAB — GLUCOSE, CAPILLARY: Glucose-Capillary: 94 mg/dL (ref 65–99)

## 2015-06-23 LAB — POCT PREGNANCY, URINE: Preg Test, Ur: NEGATIVE

## 2015-06-23 MED ORDER — LIDOCAINE HCL (CARDIAC) 20 MG/ML IV SOLN
4.0000 mg | Freq: Once | INTRAVENOUS | Status: AC
  Administered 2015-06-23: 4 mg via INTRAVENOUS

## 2015-06-23 MED ORDER — SUCCINYLCHOLINE CHLORIDE 20 MG/ML IJ SOLN
100.0000 mg | Freq: Once | INTRAMUSCULAR | Status: AC
  Administered 2015-06-23: 100 mg via INTRAVENOUS

## 2015-06-23 MED ORDER — SODIUM CHLORIDE 0.9 % IV SOLN
250.0000 mL | Freq: Once | INTRAVENOUS | Status: AC
  Administered 2015-06-23: 250 mL via INTRAVENOUS

## 2015-06-23 MED ORDER — GLYCOPYRROLATE 0.2 MG/ML IJ SOLN
0.4000 mg | Freq: Once | INTRAMUSCULAR | Status: AC
  Administered 2015-06-23: 0.4 mg via INTRAVENOUS

## 2015-06-23 MED ORDER — ESMOLOL HCL-SODIUM CHLORIDE 2000 MG/100ML IV SOLN: 10.0000 mg | Freq: Once | INTRAVENOUS | Status: DC

## 2015-06-23 MED ORDER — KETOROLAC TROMETHAMINE 30 MG/ML IJ SOLN
30.0000 mg | Freq: Once | INTRAMUSCULAR | Status: AC
  Administered 2015-06-23: 30 mg via INTRAVENOUS

## 2015-06-23 MED ORDER — METHOHEXITAL SODIUM 100 MG/10ML IV SOSY
70.0000 mg | PREFILLED_SYRINGE | Freq: Once | INTRAVENOUS | Status: AC
  Administered 2015-06-23: 70 mg via INTRAVENOUS

## 2015-06-23 MED ORDER — ESMOLOL HCL-SODIUM CHLORIDE 2000 MG/100ML IV SOLN
10.0000 mg | Freq: Once | INTRAVENOUS | Status: AC
  Administered 2015-06-23: 10 mg via INTRAVENOUS

## 2015-06-23 MED ORDER — LABETALOL HCL 5 MG/ML IV SOLN
20.0000 mg | Freq: Once | INTRAVENOUS | Status: AC
  Administered 2015-06-23: 20 mg via INTRAVENOUS

## 2015-06-23 NOTE — Anesthesia Postprocedure Evaluation (Signed)
  Anesthesia Post-op Note  Patient: Melinda [name]  Procedure(s) Performed: * No procedures listed *  Anesthesia type:General  Patient location: PACU  Post pain: Pain level controlled  Post assessment: Post-op Vital signs reviewed, Patient's Cardiovascular Status Stable, Respiratory Function Stable, Patent Airway and No signs of Nausea or vomiting  Post vital signs: Reviewed and stable  Last Vitals:  Filed Vitals:   06/23/15 1251  BP: 117/72  Pulse: 99  Temp:   Resp: 16    Level of consciousness: awake, alert  and patient cooperative  Complications: No apparent anesthesia complications

## 2015-06-23 NOTE — Procedures (Signed)
ECT SERVICES Physician's Interval Evaluation & Treatment Note  Patient Identification: Melinda [name] MRN:  [id number] Date of Evaluation:  06/23/2015 TX #: [id number]  MADRS:   MMSE:   P.E. Findings:  No new physical change  Psychiatric Interval Note:  Mood is stable no worse no self-mutilation  Subjective:  Patient is a 44 y.o. female seen for evaluation for Electroconvulsive Therapy. Patient is stable. Return in 1 week  Treatment Summary:   []   Right Unilateral             [x]  Bilateral   % Energy : 1.0 ms 35%   Impedance: 1100 ohms  Seizure Energy Index: 2533 V squared  Postictal Suppression Index: 96%  Seizure Concordance Index: 89%  Medications  Pre Shock: Robinul 0.4 mg, Xylocaine 4 mg, labetalol 20 mg, Toradol 30 mg, Brevital 70 mg, esmolol 10 mg, succinylcholine 100 mg  Post Shock: None  Seizure Duration: 30 seconds by EMG, 46 seconds by EEG   Comments: Follow-up in 1 week   Lungs:  [x]   Clear to auscultation               []  Other:   Heart:    [x]   Regular rhythm             []  irregular rhythm    [x]   Previous H&P reviewed, patient examined and there are NO CHANGES                 []   Previous H&P reviewed, patient examined and there are changes noted.   [name], MD 8/12/201611:51 AM

## 2015-06-23 NOTE — Anesthesia Procedure Notes (Signed)
Date/Time: 06/23/2015 11:57 AM Performed by: Dionne Bucy Pre-anesthesia Checklist: Patient identified, Emergency Drugs available, Suction available, Patient being monitored and Timeout performed Patient Re-evaluated:Patient Re-evaluated prior to inductionOxygen Delivery Method: Circle system utilized Preoxygenation: Pre-oxygenation with 100% oxygen Intubation Type: IV induction Ventilation: Mask ventilation throughout procedure Airway Equipment and Method: Bite block Placement Confirmation: positive ETCO2 Dental Injury: Teeth and Oropharynx as per pre-operative assessment

## 2015-06-23 NOTE — Anesthesia Preprocedure Evaluation (Addendum)
Anesthesia Evaluation  Patient identified by MRN, date of birth, ID band Patient awake    Reviewed: Allergy & Precautions, NPO status , Patient's Chart, lab work & pertinent test results  History of Anesthesia Complications Negative for: history of anesthetic complications  Airway Mallampati: III  TM Distance: >3 FB Neck ROM: full    Dental  (+) Teeth Intact   Pulmonary sleep apnea ,    Pulmonary exam normal       Cardiovascular Exercise Tolerance: Good hypertension, Pt. on medications Normal cardiovascular exam+ dysrhythmias Rhythm:regular Rate:Normal     Neuro/Psych PSYCHIATRIC DISORDERS  Neuromuscular disease    GI/Hepatic GERD-  Controlled,  Endo/Other  diabetes, Type 2  Renal/GU   negative genitourinary   Musculoskeletal   Abdominal   Peds  Hematology negative hematology ROS (+)   Anesthesia Other Findings Past Medical History:   Depression                                                   Diabetes mellitus without complication                       GERD (gastroesophageal reflux disease)                       Hypercholesterolemia                            03/07/15      Hypertension                                                 Obesity                                         03/07/15      Personality disorder                            03/07/15      Sinus tachycardia                               03/07/15        Comment:history of   Suicidal thoughts                               03/07/15      Diabetic peripheral neuropathy                  03/07/15      Diabetic peripheral neuropathy                  03/07/15      Diabetic peripheral neuropathy                  03/07/15      Reproductive/Obstetrics  Anesthesia Physical  Anesthesia Plan  ASA: III  Anesthesia Plan: General   Post-op Pain Management:    Induction: Intravenous  Airway Management  Planned: Mask  Additional Equipment:   Intra-op Plan:   Post-operative Plan:   Informed Consent: I have reviewed the patients History and Physical, chart, labs and discussed the procedure including the risks, benefits and alternatives for the proposed anesthesia with the patient or authorized representative who has indicated his/her understanding and acceptance.   Dental Advisory Given  Plan Discussed with: CRNA, Anesthesiologist and Surgeon  Anesthesia Plan Comments:         Anesthesia Quick Evaluation

## 2015-06-23 NOTE — Transfer of Care (Signed)
Immediate Anesthesia Transfer of Care Note  Patient: Melinda [name]  Procedure(s) Performed: ECT  Patient Location: PACU  Anesthesia Type:General  Level of Consciousness: awake and patient cooperative  Airway & Oxygen Therapy: Patient Spontanous Breathing and Patient connected to face mask oxygen  Post-op Assessment: Report given to RN  Post vital signs: Reviewed and stable  Last Vitals:  Filed Vitals:   06/23/15 1212  BP: 110/69  Pulse: 94  Temp: 37 C  Resp: 26    Complications: No apparent anesthesia complications

## 2015-06-26 DIAGNOSIS — F330 Major depressive disorder, recurrent, mild: Secondary | ICD-10-CM | POA: Diagnosis not present

## 2015-06-28 ENCOUNTER — Other Ambulatory Visit: Payer: Self-pay

## 2015-06-29 ENCOUNTER — Other Ambulatory Visit: Payer: Self-pay | Admitting: Family Medicine

## 2015-06-29 DIAGNOSIS — Z1231 Encounter for screening mammogram for malignant neoplasm of breast: Secondary | ICD-10-CM

## 2015-06-30 ENCOUNTER — Encounter
Admission: RE | Admit: 2015-06-30 | Discharge: 2015-06-30 | Disposition: A | Discharge status: Home / Self Care | Payer: Medicare PPO | Source: Ambulatory Visit | Attending: Psychiatry | Admitting: Psychiatry

## 2015-06-30 ENCOUNTER — Encounter: Payer: Self-pay | Admitting: Anesthesiology

## 2015-06-30 DIAGNOSIS — K219 Gastro-esophageal reflux disease without esophagitis: Secondary | ICD-10-CM | POA: Diagnosis not present

## 2015-06-30 DIAGNOSIS — F332 Major depressive disorder, recurrent severe without psychotic features: Secondary | ICD-10-CM | POA: Diagnosis not present

## 2015-06-30 DIAGNOSIS — E119 Type 2 diabetes mellitus without complications: Secondary | ICD-10-CM | POA: Diagnosis not present

## 2015-06-30 DIAGNOSIS — I10 Essential (primary) hypertension: Secondary | ICD-10-CM | POA: Diagnosis not present

## 2015-06-30 LAB — POCT PREGNANCY, URINE: Preg Test, Ur: NEGATIVE

## 2015-06-30 LAB — GLUCOSE, CAPILLARY: Glucose-Capillary: 94 mg/dL (ref 65–99)

## 2015-06-30 MED ORDER — LABETALOL HCL 5 MG/ML IV SOLN
20.0000 mg | Freq: Once | INTRAVENOUS | Status: AC
  Administered 2015-06-30: 20 mg via INTRAVENOUS

## 2015-06-30 MED ORDER — SUCCINYLCHOLINE CHLORIDE 20 MG/ML IJ SOLN
100.0000 mg | Freq: Once | INTRAMUSCULAR | Status: AC
  Administered 2015-06-30: 100 mg via INTRAVENOUS

## 2015-06-30 MED ORDER — ESMOLOL HCL-SODIUM CHLORIDE 2000 MG/100ML IV SOLN
10.0000 mg | Freq: Once | INTRAVENOUS | Status: AC
  Administered 2015-06-30: 10000 ug via INTRAVENOUS

## 2015-06-30 MED ORDER — METHOHEXITAL SODIUM 100 MG/10ML IV SOSY
70.0000 mg | PREFILLED_SYRINGE | Freq: Once | INTRAVENOUS | Status: AC
  Administered 2015-06-30: 70 mg via INTRAVENOUS

## 2015-06-30 MED ORDER — GLYCOPYRROLATE 0.2 MG/ML IJ SOLN
0.4000 mg | Freq: Once | INTRAMUSCULAR | Status: AC
  Administered 2015-06-30: 0.4 mg via INTRAVENOUS

## 2015-06-30 MED ORDER — KETOROLAC TROMETHAMINE 30 MG/ML IJ SOLN
30.0000 mg | Freq: Once | INTRAMUSCULAR | Status: AC
  Administered 2015-06-30: 30 mg via INTRAVENOUS

## 2015-06-30 MED ORDER — LIDOCAINE HCL (CARDIAC) 20 MG/ML IV SOLN
4.0000 mg | Freq: Once | INTRAVENOUS | Status: AC
  Administered 2015-06-30: 4 mg via INTRAVENOUS

## 2015-06-30 MED ORDER — SODIUM CHLORIDE 0.9 % IV SOLN
250.0000 mL | Freq: Once | INTRAVENOUS | Status: AC
  Administered 2015-06-30: 250 mL via INTRAVENOUS

## 2015-06-30 NOTE — Anesthesia Postprocedure Evaluation (Signed)
  Anesthesia Post-op Note  Patient: Melinda [name]  Procedure(s) Performed: * No procedures listed *  Anesthesia type:General  Patient location: PACU  Post pain: Pain level controlled  Post assessment: Post-op Vital signs reviewed, Patient's Cardiovascular Status Stable, Respiratory Function Stable, Patent Airway and No signs of Nausea or vomiting  Post vital signs: Reviewed and stable  Last Vitals:  Filed Vitals:   06/30/15 1233  BP: 136/76  Pulse: 75  Temp:   Resp: 18    Level of consciousness: awake, alert  and patient cooperative  Complications: No apparent anesthesia complications

## 2015-06-30 NOTE — Transfer of Care (Signed)
Immediate Anesthesia Transfer of Care Note  Patient: Melinda [name]  Procedure(s) Performed: ECT  Patient Location: PACU  Anesthesia Type:General  Level of Consciousness: sedated  Airway & Oxygen Therapy: Patient Spontanous Breathing and Patient connected to face mask oxygen  Post-op Assessment: Report given to RN  Post vital signs: Reviewed and stable  Last Vitals:  Filed Vitals:   06/30/15 1149  BP: 118/63  Pulse: 93  Temp: 37.2 C  Resp: 16    Complications: No apparent anesthesia complications

## 2015-06-30 NOTE — Procedures (Signed)
ECT SERVICES Physician's Interval Evaluation & Treatment Note  Patient Identification: Melinda [name] MRN:  [id number] Date of Evaluation:  06/30/2015 TX #: [id number]  MADRS:   MMSE:   P.E. Findings:  No change to physical  Psychiatric Interval Note:  Stable control  Subjective:  Patient is a 44 y.o. female seen for evaluation for Electroconvulsive Therapy. No new complaints  Treatment Summary:   []   Right Unilateral             [x]  Bilateral   % Energy : 1.0 ms, 35%   Impedance: 640 ohms  Seizure Energy Index: 2523 V squared  Postictal Suppression Index: 93%  Seizure Concordance Index: 94%  Medications  Pre Shock: Robinul 0.4 mg, Xylocaine 4 mg, labetalol 20 mg, Toradol 30 mg, Brevital 90 mg, esmolol 10 mg, succinylcholine 100 mg  Post Shock: None  Seizure Duration: 35 seconds by EMG, 52 seconds by EEG   Comments: Follow-up one week   Lungs:  [x]   Clear to auscultation               []  Other:   Heart:    [x]   Regular rhythm             []  irregular rhythm    [x]   Previous H&P reviewed, patient examined and there are NO CHANGES                 []   Previous H&P reviewed, patient examined and there are changes noted.   [name], MD 8/19/201611:33 AM

## 2015-06-30 NOTE — Anesthesia Preprocedure Evaluation (Signed)
Anesthesia Evaluation  Patient identified by MRN, date of birth, ID band Patient awake    Reviewed: Allergy & Precautions, NPO status , Patient's Chart, lab work & pertinent test results  History of Anesthesia Complications Negative for: history of anesthetic complications  Airway Mallampati: III  TM Distance: >3 FB Neck ROM: full    Dental  (+) Teeth Intact   Pulmonary sleep apnea ,    Pulmonary exam normal       Cardiovascular Exercise Tolerance: Good hypertension, Pt. on medications Normal cardiovascular exam+ dysrhythmias Rhythm:regular Rate:Normal     Neuro/Psych PSYCHIATRIC DISORDERS  Neuromuscular disease    GI/Hepatic GERD-  Controlled,  Endo/Other  diabetes, Type 2  Renal/GU   negative genitourinary   Musculoskeletal   Abdominal   Peds  Hematology negative hematology ROS (+)   Anesthesia Other Findings Past Medical History:   Depression                                                   Diabetes mellitus without complication                       GERD (gastroesophageal reflux disease)                       Hypercholesterolemia                            03/07/15      Hypertension                                                 Obesity                                         03/07/15      Personality disorder                            03/07/15      Sinus tachycardia                               03/07/15        Comment:history of   Suicidal thoughts                               03/07/15      Diabetic peripheral neuropathy                  03/07/15      Diabetic peripheral neuropathy                  03/07/15      Diabetic peripheral neuropathy                  03/07/15      Reproductive/Obstetrics  Anesthesia Physical  Anesthesia Plan  ASA: III  Anesthesia Plan: General   Post-op Pain Management:    Induction: Intravenous  Airway Management  Planned: Mask  Additional Equipment:   Intra-op Plan:   Post-operative Plan:   Informed Consent: I have reviewed the patients History and Physical, chart, labs and discussed the procedure including the risks, benefits and alternatives for the proposed anesthesia with the patient or authorized representative who has indicated his/her understanding and acceptance.   Dental Advisory Given  Plan Discussed with: CRNA, Anesthesiologist and Surgeon  Anesthesia Plan Comments:         Anesthesia Quick Evaluation

## 2015-07-03 ENCOUNTER — Telehealth (HOSPITAL_COMMUNITY): Payer: Self-pay | Admitting: *Deleted

## 2015-07-03 NOTE — Telephone Encounter (Signed)
Called for prior authorization of ECT. Spoke to Bruni who states no authorization is required.

## 2015-07-05 ENCOUNTER — Other Ambulatory Visit: Payer: Self-pay

## 2015-07-07 ENCOUNTER — Encounter: Payer: Self-pay | Admitting: Anesthesiology

## 2015-07-07 ENCOUNTER — Encounter
Admission: RE | Admit: 2015-07-07 | Discharge: 2015-07-07 | Disposition: A | Discharge status: Home / Self Care | Payer: Medicare PPO | Source: Ambulatory Visit | Attending: Family Medicine | Admitting: Family Medicine

## 2015-07-07 DIAGNOSIS — E119 Type 2 diabetes mellitus without complications: Secondary | ICD-10-CM | POA: Diagnosis not present

## 2015-07-07 DIAGNOSIS — F332 Major depressive disorder, recurrent severe without psychotic features: Secondary | ICD-10-CM | POA: Diagnosis not present

## 2015-07-07 DIAGNOSIS — I10 Essential (primary) hypertension: Secondary | ICD-10-CM | POA: Diagnosis not present

## 2015-07-07 LAB — POCT PREGNANCY, URINE: Preg Test, Ur: NEGATIVE

## 2015-07-07 LAB — GLUCOSE, CAPILLARY: Glucose-Capillary: 91 mg/dL (ref 65–99)

## 2015-07-07 MED ORDER — ESMOLOL HCL-SODIUM CHLORIDE 2000 MG/100ML IV SOLN
10.0000 mg | Freq: Once | INTRAVENOUS | Status: AC
  Administered 2015-07-07: 10000 ug via INTRAVENOUS

## 2015-07-07 MED ORDER — LABETALOL HCL 5 MG/ML IV SOLN
20.0000 mg | Freq: Once | INTRAVENOUS | Status: AC
  Administered 2015-07-07: 20 mg via INTRAVENOUS

## 2015-07-07 MED ORDER — SODIUM CHLORIDE 0.9 % IV SOLN
250.0000 mL | Freq: Once | INTRAVENOUS | Status: AC
  Administered 2015-07-07: 250 mL via INTRAVENOUS

## 2015-07-07 MED ORDER — METHOHEXITAL SODIUM 100 MG/10ML IV SOSY
70.0000 mg | PREFILLED_SYRINGE | Freq: Once | INTRAVENOUS | Status: AC
  Administered 2015-07-07: 70 mg via INTRAVENOUS

## 2015-07-07 MED ORDER — SUCCINYLCHOLINE CHLORIDE 20 MG/ML IJ SOLN
100.0000 mg | Freq: Once | INTRAMUSCULAR | Status: AC
  Administered 2015-07-07: 100 mg via INTRAVENOUS

## 2015-07-07 MED ORDER — KETOROLAC TROMETHAMINE 30 MG/ML IJ SOLN
30.0000 mg | Freq: Once | INTRAMUSCULAR | Status: AC
  Administered 2015-07-07: 30 mg via INTRAVENOUS

## 2015-07-07 MED ORDER — LIDOCAINE HCL (CARDIAC) 20 MG/ML IV SOLN
4.0000 mg | Freq: Once | INTRAVENOUS | Status: AC
  Administered 2015-07-07: 4 mg via INTRAVENOUS

## 2015-07-07 MED ORDER — GLYCOPYRROLATE 0.2 MG/ML IJ SOLN
0.4000 mg | Freq: Once | INTRAMUSCULAR | Status: AC
  Administered 2015-07-07: 0.4 mg via INTRAVENOUS

## 2015-07-07 NOTE — Procedures (Signed)
ECT SERVICES Physician's Interval Evaluation & Treatment Note  Patient Identification: Melinda [name] MRN:  [id number] Date of Evaluation:  07/07/2015 TX #: [id number]  MADRS:   MMSE:   P.E. Findings:  No change to physical exam  Psychiatric Interval Note:  Mood stable  Subjective:  Patient is a 44 y.o. female seen for evaluation for Electroconvulsive Therapy. No new complaints  Treatment Summary:   []   Right Unilateral             [x]  Bilateral   % Energy : 1.0 ms 35%   Impedance: 930 ohms  Seizure Energy Index: 3979 V squared  Postictal Suppression Index: 69%  Seizure Concordance Index: 94%  Medications  Pre Shock: Robinul 0.4 mg, Xylocaine 4 mg, labetalol 20 mg, Toradol 30 mg, Brevital 90 mg, esmolol 10 mg, succinylcholine 100 mg  Post Shock: None  Seizure Duration: 38 seconds by EMG 72 seconds by EEG   Comments: Continue current pattern return next week   Lungs:  [x]   Clear to auscultation               []  Other:   Heart:    [x]   Regular rhythm             []  irregular rhythm    [x]   Previous H&P reviewed, patient examined and there are NO CHANGES                 []   Previous H&P reviewed, patient examined and there are changes noted.   [name], MD 8/26/201612:12 PM

## 2015-07-07 NOTE — Anesthesia Preprocedure Evaluation (Signed)
Anesthesia Evaluation  Patient identified by MRN, date of birth, ID band Patient awake    Reviewed: Allergy & Precautions, NPO status , Patient's Chart, lab work & pertinent test results  Airway Mallampati: III       Dental no notable dental hx.    Pulmonary sleep apnea ,    + decreased breath sounds      Cardiovascular hypertension, Pt. on medications Normal cardiovascular exam    Neuro/Psych Depression Bipolar Disorder    GI/Hepatic Neg liver ROS, GERD-  ,  Endo/Other  diabetes, Type 2, Oral Hypoglycemic AgentsMorbid obesity  Renal/GU negative Renal ROS     Musculoskeletal   Abdominal (+) + obese,   Peds  Hematology   Anesthesia Other Findings   Reproductive/Obstetrics                             Anesthesia Physical Anesthesia Plan  ASA: III  Anesthesia Plan: General   Post-op Pain Management:    Induction: Intravenous  Airway Management Planned: Natural Airway  Additional Equipment:   Intra-op Plan:   Post-operative Plan:   Informed Consent:   Plan Discussed with: CRNA  Anesthesia Plan Comments:         Anesthesia Quick Evaluation

## 2015-07-07 NOTE — Transfer of Care (Signed)
Immediate Anesthesia Transfer of Care Note  Patient: Melinda [name]  Procedure(s) Performed: * No procedures listed *  Patient Location: PACU  Anesthesia Type:General  Level of Consciousness: awake, alert , oriented and patient cooperative  Airway & Oxygen Therapy: Patient Spontanous Breathing and Patient connected to face mask oxygen  Post-op Assessment: Report given to RN, Post -op Vital signs reviewed and stable and Patient moving all extremities X 4  Post vital signs: Reviewed and stable  Last Vitals:  Filed Vitals:   07/07/15 1227  BP: 123/69  Pulse: 96  Temp: 37.4 C  Resp: 18    Complications: No apparent anesthesia complications

## 2015-07-07 NOTE — Anesthesia Postprocedure Evaluation (Signed)
  Anesthesia Post-op Note  Patient: Melinda [name]  Procedure(s) Performed: * No procedures listed *  Anesthesia type:General  Patient location: PACU  Post pain: Pain level controlled  Post assessment: Post-op Vital signs reviewed, Patient's Cardiovascular Status Stable, Respiratory Function Stable, Patent Airway and No signs of Nausea or vomiting  Post vital signs: Reviewed and stable  Last Vitals:  Filed Vitals:   07/07/15 1227  BP: 123/69  Pulse: 96  Temp: 37.4 C  Resp: 18    Level of consciousness: awake, alert  and patient cooperative  Complications: No apparent anesthesia complications

## 2015-07-10 ENCOUNTER — Ambulatory Visit
Admission: RE | Admit: 2015-07-10 | Discharge: 2015-07-10 | Disposition: A | Discharge status: Home / Self Care | Payer: Medicare PPO | Source: Ambulatory Visit | Attending: Family Medicine | Admitting: Family Medicine

## 2015-07-10 DIAGNOSIS — Z1231 Encounter for screening mammogram for malignant neoplasm of breast: Secondary | ICD-10-CM | POA: Diagnosis not present

## 2015-07-12 ENCOUNTER — Other Ambulatory Visit: Payer: Self-pay

## 2015-07-14 ENCOUNTER — Encounter: Payer: Self-pay | Admitting: Anesthesiology

## 2015-07-14 ENCOUNTER — Encounter: Payer: Medicare PPO | Admitting: Anesthesiology

## 2015-07-14 ENCOUNTER — Encounter
Admission: RE | Admit: 2015-07-14 | Discharge: 2015-07-14 | Disposition: A | Discharge status: Home / Self Care | Payer: Medicare PPO | Source: Ambulatory Visit | Attending: Psychiatry | Admitting: Psychiatry

## 2015-07-14 DIAGNOSIS — F332 Major depressive disorder, recurrent severe without psychotic features: Secondary | ICD-10-CM | POA: Diagnosis not present

## 2015-07-14 DIAGNOSIS — Z6841 Body Mass Index (BMI) 40.0 and over, adult: Secondary | ICD-10-CM | POA: Diagnosis not present

## 2015-07-14 LAB — GLUCOSE, CAPILLARY: Glucose-Capillary: 81 mg/dL (ref 65–99)

## 2015-07-14 LAB — POCT PREGNANCY, URINE: Preg Test, Ur: NEGATIVE

## 2015-07-14 MED ORDER — SODIUM CHLORIDE 0.9 % IV SOLN
INTRAVENOUS | Status: DC | PRN
  Administered 2015-07-14: 11:00:00 via INTRAVENOUS

## 2015-07-14 MED ORDER — LIDOCAINE HCL (CARDIAC) 20 MG/ML IV SOLN
4.0000 mg | Freq: Once | INTRAVENOUS | Status: AC
Start: 2015-07-14 — End: 2015-07-14
  Administered 2015-07-14: 4 mg via INTRAVENOUS

## 2015-07-14 MED ORDER — METHOHEXITAL SODIUM 100 MG/10ML IV SOSY
70.0000 mg | PREFILLED_SYRINGE | Freq: Once | INTRAVENOUS | Status: AC
  Administered 2015-07-14: 70 mg via INTRAVENOUS

## 2015-07-14 MED ORDER — LABETALOL HCL 5 MG/ML IV SOLN
20.0000 mg | Freq: Once | INTRAVENOUS | Status: AC
  Administered 2015-07-14: 20 mg via INTRAVENOUS

## 2015-07-14 MED ORDER — SODIUM CHLORIDE 0.9 % IV SOLN
250.0000 mL | Freq: Once | INTRAVENOUS | Status: AC
  Administered 2015-07-14: 250 mL via INTRAVENOUS

## 2015-07-14 MED ORDER — ESMOLOL HCL-SODIUM CHLORIDE 2000 MG/100ML IV SOLN
10.0000 mg | Freq: Once | INTRAVENOUS | Status: AC
  Administered 2015-07-14: 10000 ug via INTRAVENOUS

## 2015-07-14 MED ORDER — SUCCINYLCHOLINE CHLORIDE 20 MG/ML IJ SOLN
100.0000 mg | Freq: Once | INTRAMUSCULAR | Status: AC
  Administered 2015-07-14: 100 mg via INTRAVENOUS

## 2015-07-14 MED ORDER — GLYCOPYRROLATE 0.2 MG/ML IJ SOLN
0.4000 mg | Freq: Once | INTRAMUSCULAR | Status: AC
  Administered 2015-07-14: 0.4 mg via INTRAVENOUS

## 2015-07-14 MED ORDER — KETOROLAC TROMETHAMINE 30 MG/ML IJ SOLN
30.0000 mg | Freq: Once | INTRAMUSCULAR | Status: AC
  Administered 2015-07-14: 30 mg via INTRAVENOUS

## 2015-07-14 NOTE — Procedures (Signed)
ECT SERVICES Physician's Interval Evaluation & Treatment Note  Patient Identification: Melinda [name] MRN:  [id number] Date of Evaluation:  07/14/2015 TX #: [id number]  MADRS:   MMSE:   P.E. Findings:  No change to physical exam  Psychiatric Interval Note:  Mood is stable. Not good but not worse not suicidal not self mutilating  Subjective:  Patient is a 44 y.o. female seen for evaluation for Electroconvulsive Therapy. Patient has no specific new complaints.  Treatment Summary:   []   Right Unilateral             [x]  Bilateral   % Energy : 1.0 ms 35%   Impedance: 1300 ohms  Seizure Energy Index: 3197 V squared  Postictal Suppression Index: 71%  Seizure Concordance Index: 95%  Medications  Pre Shock: Robinul 0.4 mg, Xylocaine 4 mg, labetalol 20 mg, Toradol 30 mg, Brevital 90 mg, esmolol 10 mg, succinylcholine 100 mg  Post Shock: None  Seizure Duration: 33 seconds by EMG, 61 seconds by EEG   Comments: Follow-up one week continue maintenance that is keeping her fairly stable   Lungs:  [x]   Clear to auscultation               []  Other:   Heart:    [x]   Regular rhythm             []  irregular rhythm    [x]   Previous H&P reviewed, patient examined and there are NO CHANGES                 []   Previous H&P reviewed, patient examined and there are changes noted.   [name], MD 9/2/201611:17 AM

## 2015-07-14 NOTE — Anesthesia Procedure Notes (Signed)
Date/Time: 07/14/2015 11:20 AM Performed by: Nelda Marseille Pre-anesthesia Checklist: Patient identified, Timeout performed, Emergency Drugs available, Suction available and Patient being monitored Oxygen Delivery Method: Circle system utilized Ventilation: Mask ventilation without difficulty

## 2015-07-14 NOTE — Anesthesia Preprocedure Evaluation (Signed)
Anesthesia Evaluation  Patient identified by MRN, date of birth, ID band Patient awake    Reviewed: Allergy & Precautions, NPO status , Patient's Chart, lab work & pertinent test results  Airway Mallampati: III       Dental no notable dental hx.    Pulmonary sleep apnea ,    + decreased breath sounds      Cardiovascular hypertension, Pt. on medications Normal cardiovascular exam    Neuro/Psych Depression Bipolar Disorder    GI/Hepatic Neg liver ROS, GERD-  ,  Endo/Other  diabetes, Type 2, Oral Hypoglycemic AgentsMorbid obesity  Renal/GU negative Renal ROS     Musculoskeletal   Abdominal (+) + obese,   Peds  Hematology   Anesthesia Other Findings Past Medical History:   Depression                                                   Diabetes mellitus without complication                       GERD (gastroesophageal reflux disease)                       Hypercholesterolemia                            03/07/15      Hypertension                                                 Obesity                                         03/07/15      Personality disorder                            03/07/15      Sinus tachycardia                               03/07/15        Comment:history of   Suicidal thoughts                               03/07/15      Diabetic peripheral neuropathy                  03/07/15      Diabetic peripheral neuropathy                  03/07/15      Diabetic peripheral neuropathy                  03/07/15      Reproductive/Obstetrics                             Anesthesia Physical  Anesthesia Plan  ASA: III  Anesthesia Plan:  General   Post-op Pain Management:    Induction: Intravenous  Airway Management Planned: Natural Airway  Additional Equipment:   Intra-op Plan:   Post-operative Plan:   Informed Consent:   Plan Discussed with: CRNA  Anesthesia Plan Comments:          Anesthesia Quick Evaluation

## 2015-07-14 NOTE — Transfer of Care (Signed)
Immediate Anesthesia Transfer of Care Note  Patient: Melinda [name]  Procedure(s) Performed: ECT  Patient Location: PACU  Anesthesia Type:General  Level of Consciousness: sedated  Airway & Oxygen Therapy: Patient Spontanous Breathing and Patient connected to face mask oxygen  Post-op Assessment: Report given to RN and Post -op Vital signs reviewed and stable  Post vital signs: Reviewed and stable  Last Vitals:  Filed Vitals:   07/14/15 0912  BP: 117/62  Pulse: 85  Temp: 37 C  Resp: 18    Complications: No apparent anesthesia complications

## 2015-07-15 NOTE — Anesthesia Postprocedure Evaluation (Signed)
  Anesthesia Post-op Note  Patient: Melinda [name]  Procedure(s) Performed: * No procedures listed *  Anesthesia type:General  Patient location: PACU  Post pain: Pain level controlled  Post assessment: Post-op Vital signs reviewed, Patient's Cardiovascular Status Stable, Respiratory Function Stable, Patent Airway and No signs of Nausea or vomiting  Post vital signs: Reviewed and stable  Last Vitals:  Filed Vitals:   07/14/15 1217  BP: 101/59  Pulse: 5  Temp:   Resp: 18    Level of consciousness: awake, alert  and patient cooperative  Complications: No apparent anesthesia complications

## 2015-07-19 ENCOUNTER — Other Ambulatory Visit: Payer: Self-pay

## 2015-07-21 ENCOUNTER — Encounter (HOSPITAL_BASED_OUTPATIENT_CLINIC_OR_DEPARTMENT_OTHER)
Admission: RE | Admit: 2015-07-21 | Discharge: 2015-07-21 | Disposition: A | Discharge status: Home / Self Care | Payer: Medicare PPO | Source: Ambulatory Visit | Attending: Psychiatry | Admitting: Psychiatry

## 2015-07-21 ENCOUNTER — Encounter: Payer: Self-pay | Admitting: Anesthesiology

## 2015-07-21 DIAGNOSIS — F332 Major depressive disorder, recurrent severe without psychotic features: Secondary | ICD-10-CM

## 2015-07-21 LAB — POCT PREGNANCY, URINE: Preg Test, Ur: NEGATIVE

## 2015-07-21 LAB — GLUCOSE, CAPILLARY: Glucose-Capillary: 103 mg/dL — ABNORMAL HIGH (ref 65–99)

## 2015-07-21 MED ORDER — ONDANSETRON HCL 4 MG/2ML IJ SOLN: 4.0000 mg | Freq: Once | INTRAMUSCULAR | Status: DC | PRN

## 2015-07-21 MED ORDER — METHOHEXITAL SODIUM 100 MG/10ML IV SOSY
70.0000 mg | PREFILLED_SYRINGE | Freq: Once | INTRAVENOUS | Status: AC
  Administered 2015-07-21: 70 mg via INTRAVENOUS

## 2015-07-21 MED ORDER — ESMOLOL HCL-SODIUM CHLORIDE 2000 MG/100ML IV SOLN
10.0000 mg | Freq: Once | INTRAVENOUS | Status: AC
  Administered 2015-07-21: 10000 ug via INTRAVENOUS

## 2015-07-21 MED ORDER — GLYCOPYRROLATE 0.2 MG/ML IJ SOLN
0.4000 mg | Freq: Once | INTRAMUSCULAR | Status: AC
  Administered 2015-07-21: 0.4 mg via INTRAVENOUS

## 2015-07-21 MED ORDER — FENTANYL CITRATE (PF) 100 MCG/2ML IJ SOLN: 25.0000 ug | INTRAMUSCULAR | Status: DC | PRN

## 2015-07-21 MED ORDER — SODIUM CHLORIDE 0.9 % IV SOLN
250.0000 mL | Freq: Once | INTRAVENOUS | Status: AC
  Administered 2015-07-21: 250 mL via INTRAVENOUS

## 2015-07-21 MED ORDER — LABETALOL HCL 5 MG/ML IV SOLN
20.0000 mg | Freq: Once | INTRAVENOUS | Status: AC
  Administered 2015-07-21: 20 mg via INTRAVENOUS

## 2015-07-21 MED ORDER — SUCCINYLCHOLINE CHLORIDE 20 MG/ML IJ SOLN
100.0000 mg | Freq: Once | INTRAMUSCULAR | Status: AC
  Administered 2015-07-21: 100 mg via INTRAVENOUS

## 2015-07-21 MED ORDER — LIDOCAINE HCL (CARDIAC) 20 MG/ML IV SOLN
4.0000 mg | Freq: Once | INTRAVENOUS | Status: AC
  Administered 2015-07-21: 4 mg via INTRAVENOUS

## 2015-07-21 MED ORDER — KETOROLAC TROMETHAMINE 30 MG/ML IJ SOLN
30.0000 mg | Freq: Once | INTRAMUSCULAR | Status: AC
  Administered 2015-07-21: 30 mg via INTRAVENOUS

## 2015-07-21 NOTE — Anesthesia Postprocedure Evaluation (Signed)
  Anesthesia Post-op Note  Patient: Melinda [name]  Procedure(s) Performed: * No procedures listed *  Anesthesia type:General  Patient location: PACU  Post pain: Pain level controlled  Post assessment: Post-op Vital signs reviewed, Patient's Cardiovascular Status Stable, Respiratory Function Stable, Patent Airway and No signs of Nausea or vomiting  Post vital signs: Reviewed and stable  Last Vitals:  Filed Vitals:   07/21/15 1151  BP: 114/59  Pulse: 85  Temp:   Resp: 18    Level of consciousness: awake, alert  and patient cooperative  Complications: No apparent anesthesia complications

## 2015-07-21 NOTE — Anesthesia Procedure Notes (Signed)
Date/Time: 07/21/2015 11:08 AM Performed by: Dionne Bucy Pre-anesthesia Checklist: Patient identified, Emergency Drugs available, Suction available and Patient being monitored Patient Re-evaluated:Patient Re-evaluated prior to inductionOxygen Delivery Method: Circle system utilized Preoxygenation: Pre-oxygenation with 100% oxygen Intubation Type: IV induction Ventilation: Mask ventilation without difficulty and Mask ventilation throughout procedure Airway Equipment and Method: Bite block Placement Confirmation: positive ETCO2 Dental Injury: Teeth and Oropharynx as per pre-operative assessment

## 2015-07-21 NOTE — Transfer of Care (Signed)
Immediate Anesthesia Transfer of Care Note  Patient: Melinda [name]  Procedure(s) Performed: ECT  Patient Location: PACU  Anesthesia Type:General  Level of Consciousness: sedated  Airway & Oxygen Therapy: Patient Spontanous Breathing and Patient connected to face mask oxygen  Post-op Assessment: Report given to RN  Post vital signs: Reviewed and stable  Last Vitals:  Filed Vitals:   07/21/15 1119  BP: 92/56  Pulse: 94  Temp: 37.6 C  Resp: 16    Complications: No apparent anesthesia complications

## 2015-07-21 NOTE — Procedures (Signed)
ECT SERVICES Physician's Interval Evaluation & Treatment Note  Patient Identification: Melinda [name] MRN:  [id number] Date of Evaluation:  07/21/2015 TX #: [id number]  MADRS:   MMSE:   P.E. Findings:  No change to physical exam  Psychiatric Interval Note:  Mood is no better or worse than usual. No suicidal thoughts no self-mutilation no acute injury  Subjective:  Patient is a 44 y.o. female seen for evaluation for Electroconvulsive Therapy. No specific new complaints  Treatment Summary:   []   Right Unilateral             [x]  Bilateral   % Energy : 1.0 ms 35%   Impedance: 780 ohms  Seizure Energy Index: 1626 V squared  Postictal Suppression Index: 93%  Seizure Concordance Index: 68%  Medications  Pre Shock: Xylocaine 4 mg, Toradol 30 mg, labetalol 20 mg, Brevital 70 mg, esmolol 10 mg, succinylcholine 100 mg  Post Shock: None  Seizure Duration: 26 seconds by EMG, 27 seconds by EEG   Comments: Melinda [name] is had some shorter seizures recently. May need to keep an eye on this to increased stimuli value in the near future. Follow-up one week.   Lungs:  [x]   Clear to auscultation               []  Other:   Heart:    [x]   Regular rhythm             []  irregular rhythm    [x]   Previous H&P reviewed, patient examined and there are NO CHANGES                 []   Previous H&P reviewed, patient examined and there are changes noted.   [name], MD 9/9/201611:03 AM

## 2015-07-21 NOTE — Anesthesia Preprocedure Evaluation (Signed)
Anesthesia Evaluation  Patient identified by MRN, date of birth, ID band Patient awake    Reviewed: Allergy & Precautions, NPO status , Patient's Chart, lab work & pertinent test results, reviewed documented beta blocker date and time   Airway Mallampati: II  TM Distance: >3 FB     Dental  (+) Chipped   Pulmonary sleep apnea ,           Cardiovascular hypertension, Pt. on medications      Neuro/Psych PSYCHIATRIC DISORDERS Depression Bipolar Disorder  Neuromuscular disease    GI/Hepatic GERD  ,  Endo/Other  diabetes, Type 2  Renal/GU      Musculoskeletal   Abdominal   Peds  Hematology   Anesthesia Other Findings   Reproductive/Obstetrics                             Anesthesia Physical Anesthesia Plan  ASA: II  Anesthesia Plan: General   Post-op Pain Management:    Induction: Intravenous  Airway Management Planned: Mask  Additional Equipment:   Intra-op Plan:   Post-operative Plan:   Informed Consent: I have reviewed the patients History and Physical, chart, labs and discussed the procedure including the risks, benefits and alternatives for the proposed anesthesia with the patient or authorized representative who has indicated his/her understanding and acceptance.     Plan Discussed with: CRNA  Anesthesia Plan Comments:         Anesthesia Quick Evaluation

## 2015-07-24 NOTE — Addendum Note (Signed)
Encounter addended by: Kathyrn Drown, RN on: 07/24/2015  5:56 PM<BR>     Documentation filed: PRL Based Order Sets, Orders

## 2015-07-24 NOTE — Addendum Note (Signed)
Encounter addended by: Kathyrn Drown, RN on: 07/24/2015  6:03 PM<BR>     Documentation filed: PRL Based Order Sets, Orders

## 2015-07-26 ENCOUNTER — Other Ambulatory Visit: Payer: Self-pay

## 2015-07-27 NOTE — Addendum Note (Signed)
Encounter addended by: Kathyrn Drown, RN on: 07/27/2015  4:25 PM<BR>     Documentation filed: PRL Based Order Sets, Orders

## 2015-07-27 NOTE — Addendum Note (Signed)
Encounter addended by: Kathyrn Drown, RN on: 07/27/2015  4:16 PM<BR>     Documentation filed: PRL Based Order Sets, Orders

## 2015-07-28 ENCOUNTER — Ambulatory Visit
Admission: RE | Admit: 2015-07-28 | Discharge: 2015-07-28 | Disposition: A | Discharge status: Home / Self Care | Payer: Medicare PPO | Source: Ambulatory Visit | Attending: Psychiatry | Admitting: Psychiatry

## 2015-07-28 ENCOUNTER — Encounter: Payer: Self-pay | Admitting: Anesthesiology

## 2015-07-28 DIAGNOSIS — F332 Major depressive disorder, recurrent severe without psychotic features: Secondary | ICD-10-CM | POA: Diagnosis not present

## 2015-07-28 LAB — GLUCOSE, CAPILLARY: Glucose-Capillary: 93 mg/dL (ref 65–99)

## 2015-07-28 LAB — POCT PREGNANCY, URINE: Preg Test, Ur: NEGATIVE

## 2015-07-28 MED ORDER — SODIUM CHLORIDE 0.9 % IV SOLN
250.0000 mL | Freq: Once | INTRAVENOUS | Status: AC
  Administered 2015-07-28: 250 mL via INTRAVENOUS

## 2015-07-28 MED ORDER — ESMOLOL HCL-SODIUM CHLORIDE 2000 MG/100ML IV SOLN
10.0000 mg | Freq: Once | INTRAVENOUS | Status: AC
  Administered 2015-07-28: 10000 ug via INTRAVENOUS

## 2015-07-28 MED ORDER — SUCCINYLCHOLINE CHLORIDE 20 MG/ML IJ SOLN
100.0000 mg | Freq: Once | INTRAMUSCULAR | Status: AC
  Administered 2015-07-28: 100 mg via INTRAVENOUS

## 2015-07-28 MED ORDER — ONDANSETRON HCL 4 MG/2ML IJ SOLN
4.0000 mg | Freq: Once | INTRAMUSCULAR | Status: DC | PRN
Start: 2015-07-28 — End: 2015-07-29

## 2015-07-28 MED ORDER — LIDOCAINE HCL (CARDIAC) 20 MG/ML IV SOLN
4.0000 mg | Freq: Once | INTRAVENOUS | Status: AC
  Administered 2015-07-28: 4 mg via INTRAVENOUS

## 2015-07-28 MED ORDER — LABETALOL HCL 5 MG/ML IV SOLN
20.0000 mg | Freq: Once | INTRAVENOUS | Status: AC
  Administered 2015-07-28: 20 mg via INTRAVENOUS

## 2015-07-28 MED ORDER — FENTANYL CITRATE (PF) 100 MCG/2ML IJ SOLN: 25.0000 ug | INTRAMUSCULAR | Status: DC | PRN

## 2015-07-28 MED ORDER — KETOROLAC TROMETHAMINE 30 MG/ML IJ SOLN
30.0000 mg | Freq: Once | INTRAMUSCULAR | Status: AC
  Administered 2015-07-28: 30 mg via INTRAVENOUS

## 2015-07-28 MED ORDER — METHOHEXITAL SODIUM 100 MG/10ML IV SOSY
70.0000 mg | PREFILLED_SYRINGE | Freq: Once | INTRAVENOUS | Status: AC
  Administered 2015-07-28: 70 mg via INTRAVENOUS

## 2015-07-28 MED ORDER — GLYCOPYRROLATE 0.2 MG/ML IJ SOLN
0.4000 mg | Freq: Once | INTRAMUSCULAR | Status: AC
  Administered 2015-07-28: 0.4 mg via INTRAVENOUS

## 2015-07-28 NOTE — Anesthesia Preprocedure Evaluation (Signed)
Anesthesia Evaluation  Patient identified by MRN, date of birth, ID band Patient awake    Reviewed: Allergy & Precautions, NPO status , Patient's Chart, lab work & pertinent test results, reviewed documented beta blocker date and time   Airway Mallampati: II  TM Distance: >3 FB     Dental  (+) Chipped   Pulmonary sleep apnea ,           Cardiovascular hypertension, Pt. on medications      Neuro/Psych PSYCHIATRIC DISORDERS Depression Bipolar Disorder  Neuromuscular disease    GI/Hepatic GERD  ,  Endo/Other  diabetes, Type 2  Renal/GU      Musculoskeletal   Abdominal   Peds  Hematology   Anesthesia Other Findings   Reproductive/Obstetrics                             Anesthesia Physical  Anesthesia Plan  ASA: II  Anesthesia Plan: General   Post-op Pain Management:    Induction: Intravenous  Airway Management Planned: Mask  Additional Equipment:   Intra-op Plan:   Post-operative Plan:   Informed Consent: I have reviewed the patients History and Physical, chart, labs and discussed the procedure including the risks, benefits and alternatives for the proposed anesthesia with the patient or authorized representative who has indicated his/her understanding and acceptance.     Plan Discussed with: CRNA  Anesthesia Plan Comments:         Anesthesia Quick Evaluation

## 2015-07-28 NOTE — Anesthesia Procedure Notes (Signed)
Date/Time: 07/28/2015 11:07 AM Performed by: Dionne Bucy Pre-anesthesia Checklist: Patient identified, Emergency Drugs available, Suction available and Patient being monitored Patient Re-evaluated:Patient Re-evaluated prior to inductionOxygen Delivery Method: Circle system utilized Preoxygenation: Pre-oxygenation with 100% oxygen Intubation Type: IV induction Ventilation: Mask ventilation without difficulty and Mask ventilation throughout procedure Airway Equipment and Method: Bite block Placement Confirmation: positive ETCO2 Dental Injury: Teeth and Oropharynx as per pre-operative assessment

## 2015-07-28 NOTE — Procedures (Signed)
ECT SERVICES Physician's Interval Evaluation & Treatment Note  Patient Identification: Melinda [name] MRN:  [id number] Date of Evaluation:  07/28/2015 TX #: [id number]  MADRS: 15  MMSE: 29  P.E. Findings:  No change to physical exam  Psychiatric Interval Note:  Mood is stable without any decline  Subjective:  Patient is a 44 y.o. female seen for evaluation for Electroconvulsive Therapy. No new complaints  Treatment Summary:   []   Right Unilateral             [x]  Bilateral   % Energy : 1.0 ms 35%   Impedance: 730 ohms  Seizure Energy Index: 3398 V squared  Postictal Suppression Index: 95%  Seizure Concordance Index: 94%  Medications  Pre Shock: Robinul 0.4 mg, Xylocaine 4 mg, labetalol 20 mg, Toradol 30 mg, Brevital 70 mg, esmolol 10 mg, succinylcholine 100 mg  Post Shock: None  Seizure Duration: 33 seconds by EMG, 68 seconds by EEG   Comments: Follow-up one week September 23 . Patient continues with sustained clear improvement with current schedule with minimal to no side effects  Lungs:  [x]   Clear to auscultation               []  Other:   Heart:    [x]   Regular rhythm             []  irregular rhythm    [x]   Previous H&P reviewed, patient examined and there are NO CHANGES                 []   Previous H&P reviewed, patient examined and there are changes noted.   [name], MD 9/16/201611:03 AM

## 2015-07-28 NOTE — Transfer of Care (Signed)
Immediate Anesthesia Transfer of Care Note  Patient: Melinda [name]  Procedure(s) Performed: ECT  Patient Location: PACU  Anesthesia Type:General  Level of Consciousness: sedated  Airway & Oxygen Therapy: Patient Spontanous Breathing and Patient connected to face mask oxygen  Post-op Assessment: Report given to RN  Post vital signs: Reviewed and stable  Last Vitals:  Filed Vitals:   07/28/15 1118  BP: 105/76  Pulse: 96  Temp: 36.9 C  Resp: 18    Complications: No apparent anesthesia complications

## 2015-07-28 NOTE — Anesthesia Postprocedure Evaluation (Signed)
  Anesthesia Post-op Note  Patient: Melinda [name]  Procedure(s) Performed: ECT  Anesthesia type:General  Patient location: PACU  Post pain: Pain level controlled  Post assessment: Post-op Vital signs reviewed, Patient's Cardiovascular Status Stable, Respiratory Function Stable, Patent Airway and No signs of Nausea or vomiting  Post vital signs: Reviewed and stable  Last Vitals:  Filed Vitals:   07/28/15 1150  BP: 99/79  Pulse: 89  Temp: 36.3 C  Resp: 28    Level of consciousness: awake, alert  and patient cooperative  Complications: No apparent anesthesia complications

## 2015-07-31 NOTE — Addendum Note (Signed)
Addendum  created 07/31/15 0757 by Nelda Marseille, CRNA   Modules edited: Anesthesia Responsible Staff

## 2015-08-02 ENCOUNTER — Other Ambulatory Visit: Payer: Self-pay | Admitting: *Deleted

## 2015-08-04 ENCOUNTER — Encounter: Payer: Self-pay | Admitting: Anesthesiology

## 2015-08-04 ENCOUNTER — Encounter
Admission: RE | Admit: 2015-08-04 | Discharge: 2015-08-04 | Disposition: A | Discharge status: Home / Self Care | Payer: Medicare PPO | Source: Ambulatory Visit | Attending: Psychiatry | Admitting: Psychiatry

## 2015-08-04 DIAGNOSIS — E780 Pure hypercholesterolemia: Secondary | ICD-10-CM | POA: Insufficient documentation

## 2015-08-04 DIAGNOSIS — Z6841 Body Mass Index (BMI) 40.0 and over, adult: Secondary | ICD-10-CM | POA: Diagnosis not present

## 2015-08-04 DIAGNOSIS — K219 Gastro-esophageal reflux disease without esophagitis: Secondary | ICD-10-CM | POA: Insufficient documentation

## 2015-08-04 DIAGNOSIS — I10 Essential (primary) hypertension: Secondary | ICD-10-CM | POA: Insufficient documentation

## 2015-08-04 DIAGNOSIS — F609 Personality disorder, unspecified: Secondary | ICD-10-CM | POA: Insufficient documentation

## 2015-08-04 DIAGNOSIS — E669 Obesity, unspecified: Secondary | ICD-10-CM | POA: Diagnosis not present

## 2015-08-04 DIAGNOSIS — E1142 Type 2 diabetes mellitus with diabetic polyneuropathy: Secondary | ICD-10-CM | POA: Insufficient documentation

## 2015-08-04 DIAGNOSIS — F332 Major depressive disorder, recurrent severe without psychotic features: Secondary | ICD-10-CM | POA: Insufficient documentation

## 2015-08-04 LAB — GLUCOSE, CAPILLARY: Glucose-Capillary: 85 mg/dL (ref 65–99)

## 2015-08-04 MED ORDER — SUCCINYLCHOLINE CHLORIDE 20 MG/ML IJ SOLN
100.0000 mg | Freq: Once | INTRAMUSCULAR | Status: AC
  Administered 2015-08-04: 100 mg via INTRAVENOUS

## 2015-08-04 MED ORDER — METHOHEXITAL SODIUM 100 MG/10ML IV SOSY
70.0000 mg | PREFILLED_SYRINGE | Freq: Once | INTRAVENOUS | Status: AC
  Administered 2015-08-04: 70 mg via INTRAVENOUS

## 2015-08-04 MED ORDER — KETOROLAC TROMETHAMINE 30 MG/ML IJ SOLN
30.0000 mg | Freq: Once | INTRAMUSCULAR | Status: AC
  Administered 2015-08-04: 30 mg via INTRAVENOUS

## 2015-08-04 MED ORDER — LIDOCAINE HCL (CARDIAC) 20 MG/ML IV SOLN
4.0000 mg | Freq: Once | INTRAVENOUS | Status: AC
  Administered 2015-08-04: 4 mg via INTRAVENOUS

## 2015-08-04 MED ORDER — ESMOLOL HCL-SODIUM CHLORIDE 2000 MG/100ML IV SOLN
10.0000 mg | Freq: Once | INTRAVENOUS | Status: AC
  Administered 2015-08-04: 10000 ug via INTRAVENOUS

## 2015-08-04 MED ORDER — GLYCOPYRROLATE 0.2 MG/ML IJ SOLN
0.4000 mg | Freq: Once | INTRAMUSCULAR | Status: AC
  Administered 2015-08-04: 0.4 mg via INTRAVENOUS

## 2015-08-04 MED ORDER — SODIUM CHLORIDE 0.9 % IV SOLN
250.0000 mL | Freq: Once | INTRAVENOUS | Status: AC
  Administered 2015-08-04: 250 mL via INTRAVENOUS

## 2015-08-04 MED ORDER — LABETALOL HCL 5 MG/ML IV SOLN
20.0000 mg | Freq: Once | INTRAVENOUS | Status: AC
  Administered 2015-08-04: 20 mg via INTRAVENOUS

## 2015-08-04 NOTE — H&P (Signed)
Melinda [name] is an 44 y.o. female.   Chief Complaint: Continued maintenance treatment course for major depression using bilateral ECT HPI: Mood is doing well without complaints. No new physical complaints  Past Medical History  Diagnosis Date  . Depression   . Diabetes mellitus without complication   . GERD (gastroesophageal reflux disease)   . Hypercholesterolemia 03/07/15  . Hypertension   . Obesity 03/07/15  . Personality disorder 03/07/15  . Sinus tachycardia 03/07/15    history of  . Suicidal thoughts 03/07/15  . Diabetic peripheral neuropathy 03/07/15  . Diabetic peripheral neuropathy 03/07/15  . Diabetic peripheral neuropathy 03/07/15    Past Surgical History  Procedure Laterality Date  . Electroconvulsion therapy  03/07/15    Family History  Problem Relation Age of Onset  . Hypertension Father   . Diabetes Mother    Social History:  reports that she has never smoked. She has never used smokeless tobacco. She reports that she does not drink alcohol or use illicit drugs.  Allergies:  Allergies  Allergen Reactions  . Prednisone     Increases blood sugar     (Not in a hospital admission)  Results for orders placed or performed during the hospital encounter of 08/04/15 (from the past 48 hour(s))  Glucose, capillary     Status: None   Collection Time: 08/04/15  9:19 AM  Result Value Ref Range   Glucose-Capillary 85 65 - 99 mg/dL   No results found.  Review of Systems  Constitutional: Negative.   HENT: Negative.   Eyes: Negative.   Respiratory: Negative.   Cardiovascular: Negative.   Gastrointestinal: Negative.   Musculoskeletal: Negative.   Skin: Negative.   Neurological: Negative.   Psychiatric/Behavioral: Negative for depression, suicidal ideas, hallucinations, memory loss and substance abuse. The patient has insomnia. The patient is not nervous/anxious.     Blood pressure 119/65, pulse 86, temperature 98.2 F (36.8 C), temperature source Oral, resp.  rate 16, height 5\' 4"  (1.626 m), weight 106.595 kg (235 lb), last menstrual period 07/24/2015, SpO2 100 %. Physical Exam  Nursing note and vitals reviewed. Constitutional: She appears well-developed and well-nourished.  HENT:  Head: Normocephalic and atraumatic.  Eyes: Conjunctivae are normal. Pupils are equal, round, and reactive to light.  Neck: Normal range of motion.  Cardiovascular: Normal rate, regular rhythm and normal heart sounds.   Respiratory: Effort normal and breath sounds normal. No respiratory distress. She has no wheezes. She has no rales.  GI: Soft.  Musculoskeletal: Normal range of motion.  Neurological: She is alert.  Skin: Skin is warm and dry.  Psychiatric: She has a normal mood and affect. Her behavior is normal. Judgment and thought content normal.     Assessment/Plan Continue maintenance course of ECT. Patient is very stable at once a week. Does not tolerate decrease below that frequency. Continue weekly treatment  Melinda [name] 08/04/2015, 11:04 AM

## 2015-08-04 NOTE — Anesthesia Postprocedure Evaluation (Signed)
  Anesthesia Post-op Note  Patient: Melinda [name]  Procedure(s) Performed: * No procedures listed *  Anesthesia type:General  Patient location: PACU  Post pain: Pain level controlled  Post assessment: Post-op Vital signs reviewed, Patient's Cardiovascular Status Stable, Respiratory Function Stable, Patent Airway and No signs of Nausea or vomiting  Post vital signs: Reviewed and stable  Last Vitals:  Filed Vitals:   08/04/15 1205  BP: 112/47  Pulse: 82  Temp:   Resp: 18    Level of consciousness: awake, alert  and patient cooperative  Complications: No apparent anesthesia complications

## 2015-08-04 NOTE — Procedures (Signed)
ECT SERVICES Physician's Interval Evaluation & Treatment Note  Patient Identification: Melinda [name] MRN:  [id number] Date of Evaluation:  08/04/2015 TX #: [id number]  MADRS:   MMSE:   P.E. Findings:  No new physical complaints. Physical exam is stable no new findings  Psychiatric Interval Note:  Mood is good without any Melinda [name] of depression or cognition  Subjective:  Patient is a 44 y.o. female seen for evaluation for Electroconvulsive Therapy. Patient has no complaints. Feels she is doing very well with weekly treatment  Treatment Summary:   []   Right Unilateral             [x]  Bilateral   % Energy : 1.62ms 35%   Impedance: 940 ohms  Seizure Energy Index: 2389 V squared  Postictal Suppression Index: 57%  Seizure Concordance Index: 90%  Medications  Pre Shock: Robinul 0.4 mg, Xylocaine 4 mg, labetalol 20 mg, Toradol 30 mg, Brevital 70 mg, esmolol 10 mg, succinylcholine 100 mg  Post Shock: None  Seizure Duration: 32 seconds by EMG, 52 seconds by EEG   Comments: Tolerating treatment well. Very stable on weekly treatment. Follow-up one week September 30   Lungs:  [x]   Clear to auscultation               []  Other:   Heart:    [x]   Regular rhythm             []  irregular rhythm    [x]   Previous H&P reviewed, patient examined and there are NO CHANGES                 []   Previous H&P reviewed, patient examined and there are changes noted.   Melinda Berthold, MD 9/23/201611:07 AM

## 2015-08-04 NOTE — Anesthesia Procedure Notes (Signed)
Date/Time: 08/04/2015 11:14 AM Performed by: Dionne Bucy Pre-anesthesia Checklist: Patient identified, Emergency Drugs available, Suction available and Patient being monitored Patient Re-evaluated:Patient Re-evaluated prior to inductionOxygen Delivery Method: Circle system utilized Preoxygenation: Pre-oxygenation with 100% oxygen Intubation Type: IV induction Ventilation: Mask ventilation without difficulty and Mask ventilation throughout procedure Airway Equipment and Method: Bite block Placement Confirmation: positive ETCO2 Dental Injury: Teeth and Oropharynx as per pre-operative assessment

## 2015-08-04 NOTE — Transfer of Care (Signed)
Immediate Anesthesia Transfer of Care Note  Patient: Melinda [name]  Procedure(s) Performed: ECT  Patient Location: PACU  Anesthesia Type:General  Level of Consciousness: sedated  Airway & Oxygen Therapy: Patient Spontanous Breathing and Patient connected to face mask oxygen  Post-op Assessment: Report given to RN  Post vital signs: Reviewed and stable  Last Vitals:  Filed Vitals:   08/04/15 1124  BP: 117/74  Pulse: 90  Temp: 37.2 C  Resp: 16    Complications: No apparent anesthesia complications

## 2015-08-04 NOTE — Anesthesia Preprocedure Evaluation (Signed)
Anesthesia Evaluation  Patient identified by MRN, date of birth, ID band Patient awake    Reviewed: Allergy & Precautions, H&P , NPO status , Patient's Chart, lab work & pertinent test results, reviewed documented beta blocker date and time   Airway Mallampati: II  TM Distance: >3 FB     Dental  (+) Chipped   Pulmonary sleep apnea ,           Cardiovascular hypertension, Pt. on medications      Neuro/Psych PSYCHIATRIC DISORDERS  Neuromuscular disease    GI/Hepatic GERD  ,  Endo/Other  diabetes, Type 2  Renal/GU      Musculoskeletal   Abdominal   Peds  Hematology   Anesthesia Other Findings Past Medical History:   Depression                                                   Diabetes mellitus without complication                       GERD (gastroesophageal reflux disease)                       Hypercholesterolemia                            03/07/15      Hypertension                                                 Obesity                                         03/07/15      Personality disorder                            03/07/15      Sinus tachycardia                               03/07/15        Comment:history of   Suicidal thoughts                               03/07/15      Diabetic peripheral neuropathy                  03/07/15      Diabetic peripheral neuropathy                  03/07/15      Diabetic peripheral neuropathy                  03/07/15      Reproductive/Obstetrics                             Anesthesia Physical  Anesthesia Plan  ASA: III  Anesthesia Plan: General  Post-op Pain Management:    Induction: Intravenous  Airway Management Planned: Mask  Additional Equipment:   Intra-op Plan:   Post-operative Plan:   Informed Consent: I have reviewed the patients History and Physical, chart, labs and discussed the procedure including the risks, benefits and  alternatives for the proposed anesthesia with the patient or authorized representative who has indicated his/her understanding and acceptance.     Plan Discussed with: CRNA  Anesthesia Plan Comments:         Anesthesia Quick Evaluation

## 2015-08-07 LAB — POCT PREGNANCY, URINE: Preg Test, Ur: NEGATIVE

## 2015-08-09 ENCOUNTER — Other Ambulatory Visit: Payer: Self-pay

## 2015-08-11 ENCOUNTER — Encounter
Admission: RE | Admit: 2015-08-11 | Discharge: 2015-08-11 | Disposition: A | Discharge status: Home / Self Care | Payer: Medicare PPO | Source: Ambulatory Visit | Attending: Psychiatry | Admitting: Psychiatry

## 2015-08-11 ENCOUNTER — Encounter: Payer: Self-pay | Admitting: *Deleted

## 2015-08-11 DIAGNOSIS — I10 Essential (primary) hypertension: Secondary | ICD-10-CM | POA: Insufficient documentation

## 2015-08-11 DIAGNOSIS — F609 Personality disorder, unspecified: Secondary | ICD-10-CM | POA: Insufficient documentation

## 2015-08-11 DIAGNOSIS — Z6841 Body Mass Index (BMI) 40.0 and over, adult: Secondary | ICD-10-CM | POA: Diagnosis not present

## 2015-08-11 DIAGNOSIS — E1142 Type 2 diabetes mellitus with diabetic polyneuropathy: Secondary | ICD-10-CM | POA: Diagnosis not present

## 2015-08-11 DIAGNOSIS — E780 Pure hypercholesterolemia: Secondary | ICD-10-CM | POA: Insufficient documentation

## 2015-08-11 DIAGNOSIS — F332 Major depressive disorder, recurrent severe without psychotic features: Secondary | ICD-10-CM

## 2015-08-11 DIAGNOSIS — K219 Gastro-esophageal reflux disease without esophagitis: Secondary | ICD-10-CM | POA: Insufficient documentation

## 2015-08-11 DIAGNOSIS — E669 Obesity, unspecified: Secondary | ICD-10-CM | POA: Diagnosis not present

## 2015-08-11 LAB — GLUCOSE, CAPILLARY: Glucose-Capillary: 91 mg/dL (ref 65–99)

## 2015-08-11 LAB — POCT PREGNANCY, URINE: Preg Test, Ur: NEGATIVE

## 2015-08-11 MED ORDER — KETOROLAC TROMETHAMINE 30 MG/ML IJ SOLN
30.0000 mg | Freq: Once | INTRAMUSCULAR | Status: AC
  Administered 2015-08-11: 30 mg via INTRAVENOUS

## 2015-08-11 MED ORDER — GLYCOPYRROLATE 0.2 MG/ML IJ SOLN
0.4000 mg | Freq: Once | INTRAMUSCULAR | Status: AC
  Administered 2015-08-11: 0.4 mg via INTRAVENOUS

## 2015-08-11 MED ORDER — METHOHEXITAL SODIUM 100 MG/10ML IV SOSY
70.0000 mg | PREFILLED_SYRINGE | Freq: Once | INTRAVENOUS | Status: AC
  Administered 2015-08-11: 70 mg via INTRAVENOUS

## 2015-08-11 MED ORDER — LABETALOL HCL 5 MG/ML IV SOLN
20.0000 mg | Freq: Once | INTRAVENOUS | Status: AC
  Administered 2015-08-11: 20 mg via INTRAVENOUS

## 2015-08-11 MED ORDER — ESMOLOL HCL-SODIUM CHLORIDE 2000 MG/100ML IV SOLN
10.0000 mg | Freq: Once | INTRAVENOUS | Status: AC
  Administered 2015-08-11: 10 mg via INTRAVENOUS

## 2015-08-11 MED ORDER — SUCCINYLCHOLINE CHLORIDE 20 MG/ML IJ SOLN
100.0000 mg | Freq: Once | INTRAMUSCULAR | Status: AC
  Administered 2015-08-11: 100 mg via INTRAVENOUS

## 2015-08-11 MED ORDER — SODIUM CHLORIDE 0.9 % IV SOLN
250.0000 mL | Freq: Once | INTRAVENOUS | Status: AC
  Administered 2015-08-11: 250 mL via INTRAVENOUS

## 2015-08-11 MED ORDER — LIDOCAINE HCL (CARDIAC) 20 MG/ML IV SOLN
4.0000 mg | Freq: Once | INTRAVENOUS | Status: AC
  Administered 2015-08-11: 4 mg via INTRAVENOUS

## 2015-08-11 NOTE — Anesthesia Procedure Notes (Signed)
Date/Time: 08/11/2015 11:12 AM Performed by: Dionne Bucy Pre-anesthesia Checklist: Patient identified, Emergency Drugs available, Suction available and Patient being monitored Patient Re-evaluated:Patient Re-evaluated prior to inductionOxygen Delivery Method: Circle system utilized Preoxygenation: Pre-oxygenation with 100% oxygen Intubation Type: IV induction Ventilation: Mask ventilation without difficulty and Mask ventilation throughout procedure Airway Equipment and Method: Bite block Placement Confirmation: positive ETCO2 Dental Injury: Teeth and Oropharynx as per pre-operative assessment

## 2015-08-11 NOTE — Anesthesia Preprocedure Evaluation (Addendum)
Anesthesia Evaluation  Patient identified by MRN, date of birth, ID band Patient awake    Reviewed: Allergy & Precautions, NPO status , Patient's Chart, lab work & pertinent test results  Airway Mallampati: III  TM Distance: >3 FB Neck ROM: Full    Dental  (+) Teeth Intact   Pulmonary    Pulmonary exam normal        Cardiovascular Exercise Tolerance: Poor hypertension, Pt. on medications and Pt. on home beta blockers Normal cardiovascular exam     Neuro/Psych Depression Diabetic peripheral neuropathy.  Neuromuscular disease    GI/Hepatic Denies GERD.   Endo/Other  diabetes, Type 2BG 91 this morning.  Renal/GU      Musculoskeletal   Abdominal (+) + obese,   Peds  Hematology   Anesthesia Other Findings   Reproductive/Obstetrics                            Anesthesia Physical Anesthesia Plan  ASA: III  Anesthesia Plan: General   Post-op Pain Management:    Induction: Intravenous  Airway Management Planned: Mask  Additional Equipment:   Intra-op Plan:   Post-operative Plan:   Informed Consent: I have reviewed the patients History and Physical, chart, labs and discussed the procedure including the risks, benefits and alternatives for the proposed anesthesia with the patient or authorized representative who has indicated his/her understanding and acceptance.     Plan Discussed with: CRNA  Anesthesia Plan Comments:         Anesthesia Quick Evaluation

## 2015-08-11 NOTE — Procedures (Signed)
ECT SERVICES Physician's Interval Evaluation & Treatment Note  Patient Identification: Melinda [name] MRN:  [id number] Date of Evaluation:  08/11/2015 TX #: [id number]  MADRS:   MMSE:   P.E. Findings:  Mood is doing well no complaints no significant depression symptoms  Psychiatric Interval Note:  Physically doing well no new physical complaints blood sugars well maintained  Subjective:  Patient is a 44 y.o. female seen for evaluation for Electroconvulsive Therapy. Patient has no complaint feeling well.  Treatment Summary:   []   Right Unilateral             [x]  Bilateral   % Energy : 1.0 ms, 35%   Impedance: 1190 ohms  Seizure Energy Index: 6952 V squared  Postictal Suppression Index: 97%  Seizure Concordance Index: 98%  Medications  Pre Shock: Robinul 0.4 mg, Xylocaine 4 mg, labetalol 20 mg, Toradol 30 mg, Brevital 70 mg, esmolol 10 mg, succinylcholine 100 mg  Post Shock: None  Seizure Duration: 32 seconds by EMG, 44 seconds by EEG   Comments: Melinda [name] continues to show good control of mood symptoms without self-mutilation or suicidality and with improved function on a one-week schedule which she is tolerating well. Plan for follow-up outpatient treatment next week October 7   Lungs:  [x]   Clear to auscultation               []  Other:   Heart:    [x]   Regular rhythm             []  irregular rhythm    [x]   Previous H&P reviewed, patient examined and there are NO CHANGES                 []   Previous H&P reviewed, patient examined and there are changes noted.   [name], MD 9/30/201611:07 AM

## 2015-08-11 NOTE — H&P (Signed)
Melinda [name] is an 44 y.o. female.   Chief Complaint: Recurrent severe depression currently maintained well on weekly ECT. No new complaints HPI: No interval physical change since last time  Past Medical History  Diagnosis Date  . Depression   . Diabetes mellitus without complication   . GERD (gastroesophageal reflux disease)   . Hypercholesterolemia 03/07/15  . Hypertension   . Obesity 03/07/15  . Personality disorder 03/07/15  . Sinus tachycardia 03/07/15    history of  . Suicidal thoughts 03/07/15  . Diabetic peripheral neuropathy 03/07/15  . Diabetic peripheral neuropathy 03/07/15  . Diabetic peripheral neuropathy 03/07/15    Past Surgical History  Procedure Laterality Date  . Electroconvulsion therapy  03/07/15    Family History  Problem Relation Age of Onset  . Hypertension Father   . Diabetes Mother    Social History:  reports that she has never smoked. She has never used smokeless tobacco. She reports that she does not drink alcohol or use illicit drugs.  Allergies:  Allergies  Allergen Reactions  . Prednisone     Increases blood sugar     (Not in a hospital admission)  Results for orders placed or performed during the hospital encounter of 08/11/15 (from the past 48 hour(s))  Pregnancy, urine POC     Status: None   Collection Time: 08/11/15  8:44 AM  Result Value Ref Range   Preg Test, Ur NEGATIVE NEGATIVE    Comment:        THE SENSITIVITY OF THIS METHODOLOGY IS >24 mIU/mL   Glucose, capillary     Status: None   Collection Time: 08/11/15  8:48 AM  Result Value Ref Range   Glucose-Capillary 91 65 - 99 mg/dL   No results found.  Review of Systems  Constitutional: Negative.   HENT: Negative.   Eyes: Negative.   Respiratory: Negative.   Cardiovascular: Negative.   Gastrointestinal: Negative.   Musculoskeletal: Negative.   Skin: Negative.   Neurological: Negative.   Psychiatric/Behavioral: Negative for depression, suicidal ideas, hallucinations,  memory loss and substance abuse. The patient is not nervous/anxious and does not have insomnia.     Blood pressure 134/68, temperature 98.6 F (37 C), temperature source Oral, resp. rate 18, height 5\' 4"  (1.626 m), weight 107.049 kg (236 lb), last menstrual period 07/24/2015, SpO2 99 %. Physical Exam  Nursing note and vitals reviewed. Constitutional: She appears well-developed and well-nourished.  HENT:  Head: Normocephalic and atraumatic.  Eyes: Conjunctivae are normal. Pupils are equal, round, and reactive to light.  Neck: Normal range of motion.  Cardiovascular: Normal rate, regular rhythm and normal heart sounds.   Respiratory: Effort normal and breath sounds normal. No respiratory distress. She has no wheezes. She has no rales.  GI: Soft.  Musculoskeletal: Normal range of motion.  Neurological: She is alert.  Skin: Skin is warm and dry.  Psychiatric: She has a normal mood and affect. Her behavior is normal. Judgment and thought content normal.     Assessment/Plan Continue weekly ECT which has done a great deal to helping her maintain adequate mood control.  Lorice Lafave 08/11/2015, 11:05 AM

## 2015-08-11 NOTE — Anesthesia Postprocedure Evaluation (Signed)
  Anesthesia Post-op Note  Patient: Melinda [name]  Procedure(s) Performed: * No procedures listed *  Anesthesia type:General  Patient location: PACU  Post pain: Pain level controlled  Post assessment: Post-op Vital signs reviewed, Patient's Cardiovascular Status Stable, Respiratory Function Stable, Patent Airway and No signs of Nausea or vomiting  Post vital signs: Reviewed and stable  Last Vitals:  Filed Vitals:   08/11/15 0845  BP: 134/68  Temp: 37 C  Resp: 18    Level of consciousness: awake, alert  and patient cooperative  Complications: No apparent anesthesia complications

## 2015-08-11 NOTE — Transfer of Care (Signed)
Immediate Anesthesia Transfer of Care Note  Patient: Melinda [name]  Procedure(s) Performed: ECT  Patient Location: PACU  Anesthesia Type:General  Level of Consciousness: sedated  Airway & Oxygen Therapy: Patient Spontanous Breathing and Patient connected to face mask oxygen  Post-op Assessment: Report given to RN  Post vital signs: Reviewed and stable  Last Vitals:  Filed Vitals:   08/11/15 1122  BP: 124/72  Pulse: 93  Temp: 38.2 C  Resp: 15    Complications: No apparent anesthesia complications

## 2015-08-16 ENCOUNTER — Other Ambulatory Visit: Payer: Self-pay

## 2015-08-18 ENCOUNTER — Encounter: Payer: Medicare PPO | Admitting: Anesthesiology

## 2015-08-18 ENCOUNTER — Encounter
Admission: RE | Admit: 2015-08-18 | Discharge: 2015-08-18 | Disposition: A | Discharge status: Home / Self Care | Payer: Medicare PPO | Source: Ambulatory Visit | Attending: Psychiatry | Admitting: Psychiatry

## 2015-08-18 ENCOUNTER — Encounter: Payer: Self-pay | Admitting: Anesthesiology

## 2015-08-18 DIAGNOSIS — K219 Gastro-esophageal reflux disease without esophagitis: Secondary | ICD-10-CM | POA: Diagnosis not present

## 2015-08-18 DIAGNOSIS — E669 Obesity, unspecified: Secondary | ICD-10-CM | POA: Insufficient documentation

## 2015-08-18 DIAGNOSIS — I10 Essential (primary) hypertension: Secondary | ICD-10-CM | POA: Insufficient documentation

## 2015-08-18 DIAGNOSIS — E1142 Type 2 diabetes mellitus with diabetic polyneuropathy: Secondary | ICD-10-CM | POA: Diagnosis not present

## 2015-08-18 DIAGNOSIS — Z6841 Body Mass Index (BMI) 40.0 and over, adult: Secondary | ICD-10-CM | POA: Insufficient documentation

## 2015-08-18 DIAGNOSIS — E119 Type 2 diabetes mellitus without complications: Secondary | ICD-10-CM | POA: Diagnosis not present

## 2015-08-18 DIAGNOSIS — F332 Major depressive disorder, recurrent severe without psychotic features: Secondary | ICD-10-CM | POA: Diagnosis not present

## 2015-08-18 DIAGNOSIS — E7800 Pure hypercholesterolemia, unspecified: Secondary | ICD-10-CM | POA: Diagnosis not present

## 2015-08-18 LAB — GLUCOSE, CAPILLARY: Glucose-Capillary: 91 mg/dL (ref 65–99)

## 2015-08-18 MED ORDER — SUCCINYLCHOLINE CHLORIDE 20 MG/ML IJ SOLN
100.0000 mg | Freq: Once | INTRAMUSCULAR | Status: AC
  Administered 2015-08-18: 100 mg via INTRAVENOUS

## 2015-08-18 MED ORDER — GLYCOPYRROLATE 0.2 MG/ML IJ SOLN
0.4000 mg | Freq: Once | INTRAMUSCULAR | Status: AC
  Administered 2015-08-18: 0.4 mg via INTRAVENOUS

## 2015-08-18 MED ORDER — SODIUM CHLORIDE 0.9 % IV SOLN
INTRAVENOUS | Status: DC | PRN
  Administered 2015-08-18: 12:00:00 via INTRAVENOUS

## 2015-08-18 MED ORDER — ESMOLOL HCL-SODIUM CHLORIDE 2000 MG/100ML IV SOLN
10.0000 mg | Freq: Once | INTRAVENOUS | Status: AC
  Administered 2015-08-18: 10000 ug via INTRAVENOUS

## 2015-08-18 MED ORDER — KETOROLAC TROMETHAMINE 30 MG/ML IJ SOLN
30.0000 mg | Freq: Once | INTRAMUSCULAR | Status: AC
  Administered 2015-08-18: 30 mg via INTRAVENOUS

## 2015-08-18 MED ORDER — LABETALOL HCL 5 MG/ML IV SOLN
20.0000 mg | Freq: Once | INTRAVENOUS | Status: AC
  Administered 2015-08-18: 20 mg via INTRAVENOUS

## 2015-08-18 MED ORDER — SODIUM CHLORIDE 0.9 % IV SOLN
250.0000 mL | Freq: Once | INTRAVENOUS | Status: AC
  Administered 2015-08-18: 250 mL via INTRAVENOUS

## 2015-08-18 MED ORDER — LIDOCAINE HCL (CARDIAC) 20 MG/ML IV SOLN
4.0000 mg | Freq: Once | INTRAVENOUS | Status: AC
  Administered 2015-08-18: 4 mg via INTRAVENOUS

## 2015-08-18 MED ORDER — METHOHEXITAL SODIUM 100 MG/10ML IV SOSY
70.0000 mg | PREFILLED_SYRINGE | Freq: Once | INTRAVENOUS | Status: AC
  Administered 2015-08-18: 70 mg via INTRAVENOUS

## 2015-08-18 NOTE — Addendum Note (Signed)
Addendum  created 08/18/15 1323 by Nelda Marseille, CRNA   Modules edited: Charges VN

## 2015-08-18 NOTE — Anesthesia Postprocedure Evaluation (Signed)
  Anesthesia Post-op Note  Patient: Melinda [name]  Procedure(s) Performed: * No procedures listed *  Anesthesia type:General  Patient location: PACU  Post pain: Pain level controlled  Post assessment: Post-op Vital signs reviewed, Patient's Cardiovascular Status Stable, Respiratory Function Stable, Patent Airway and No signs of Nausea or vomiting  Post vital signs: Reviewed and stable  Last Vitals:  Filed Vitals:   08/18/15 1221  BP: 115/60  Pulse:   Temp:   Resp:     Level of consciousness: awake, alert  and patient cooperative  Complications: No apparent anesthesia complications

## 2015-08-18 NOTE — H&P (Signed)
Melinda [name] is an 44 y.o. female.   Chief Complaint: Patient with chronic severe recurrent depression and receiving maintenance treatment on a weekly basis. No new complaints since last treatment HPI: Patient tolerates treatment very well with minimal to no memory impairment and good sustained improvement in her mood  Past Medical History  Diagnosis Date  . Depression   . Diabetes mellitus without complication ([place])   . GERD (gastroesophageal reflux disease)   . Hypercholesterolemia 03/07/15  . Hypertension   . Obesity 03/07/15  . Personality disorder 03/07/15  . Sinus tachycardia ([place]) 03/07/15    history of  . Suicidal thoughts 03/07/15  . Diabetic peripheral neuropathy ([place]) 03/07/15  . Diabetic peripheral neuropathy ([place]) 03/07/15  . Diabetic peripheral neuropathy ([place]) 03/07/15    Past Surgical History  Procedure Laterality Date  . Electroconvulsion therapy  03/07/15    Family History  Problem Relation Age of Onset  . Hypertension Father   . Diabetes Mother    Social History:  reports that she has never smoked. She has never used smokeless tobacco. She reports that she does not drink alcohol or use illicit drugs.  Allergies:  Allergies  Allergen Reactions  . Prednisone     Increases blood sugar     (Not in a hospital admission)  Results for orders placed or performed during the hospital encounter of 08/18/15 (from the past 48 hour(s))  Glucose, capillary     Status: None   Collection Time: 08/18/15  9:19 AM  Result Value Ref Range   Glucose-Capillary 91 65 - 99 mg/dL   No results found.  Review of Systems  Constitutional: Negative.   HENT: Negative.   Eyes: Negative.   Respiratory: Negative.   Cardiovascular: Negative.   Gastrointestinal: Negative.   Musculoskeletal: Negative.   Skin: Negative.   Neurological: Negative.   Psychiatric/Behavioral: Positive for depression. Negative for suicidal ideas, hallucinations, memory loss and substance abuse. The  patient is not nervous/anxious and does not have insomnia.     Blood pressure 123/65, pulse 92, temperature 97.4 F (36.3 C), temperature source Oral, resp. rate 16, height 5\' 4"  (1.626 m), weight 106.142 kg (234 lb), last menstrual period 08/17/2015, SpO2 100 %. Physical Exam  Nursing note and vitals reviewed. Constitutional: She appears well-developed and well-nourished.  HENT:  Head: Normocephalic and atraumatic.  Eyes: Conjunctivae are normal. Pupils are equal, round, and reactive to light.  Neck: Normal range of motion.  Cardiovascular: Normal rate, regular rhythm and normal heart sounds.   Respiratory: Effort normal and breath sounds normal. No respiratory distress. She has no wheezes. She has no rales.  GI: Soft.  Musculoskeletal: Normal range of motion.  Neurological: She is alert.  Skin: Skin is warm and dry.  Psychiatric: Judgment and thought content normal. Her speech is delayed. She is slowed. Cognition and memory are normal. She exhibits a depressed mood.     Assessment/Plan Continue weekly schedule follow-up next Friday  Kaliya Shreiner 08/18/2015, 11:53 AM

## 2015-08-18 NOTE — Procedures (Signed)
ECT SERVICES Physician's Interval Evaluation & Treatment Note  Patient Identification: Melinda [name] MRN:  [id number] Date of Evaluation:  08/18/2015 TX #: [id number]  MADRS: 16  MMSE: 29  P.E. Findings:  No change to physical exam  Psychiatric Interval Note:  Mood is sustained had generally improved although she still stays withdrawn  Subjective:  Patient is a 44 y.o. female seen for evaluation for Electroconvulsive Therapy. No new complaint  Treatment Summary:   []   Right Unilateral             [x]  Bilateral   % Energy : 1.0 ms, 35%   Impedance: 1070 ohms  Seizure Energy Index: 2099 V squared  Postictal Suppression Index: 95%  Seizure Concordance Index: 86%  Medications  Pre Shock: Robinul 0.4 mg, Xylocaine 4 mg, labetalol 20 mg, Toradol 30 mg, Brevital 70 mg, esmolol 10 mg, succinylcholine 100 mg  Post Shock:    Seizure Duration: 31 seconds by EMG, 40 seconds by EEG   Comments: Follow-up one week per regular schedule   Lungs:  [x]   Clear to auscultation               []  Other:   Heart:    [x]   Regular rhythm             []  irregular rhythm    [x]   Previous H&P reviewed, patient examined and there are NO CHANGES                 []   Previous H&P reviewed, patient examined and there are changes noted.   [name], MD 10/7/201611:55 AM

## 2015-08-18 NOTE — Anesthesia Preprocedure Evaluation (Signed)
Anesthesia Evaluation  Patient identified by MRN, date of birth, ID band Patient awake    Reviewed: Allergy & Precautions, NPO status , Patient's Chart, lab work & pertinent test results  Airway Mallampati: III  TM Distance: >3 FB Neck ROM: Full    Dental  (+) Chipped   Pulmonary          Cardiovascular hypertension, Pt. on medications +CHF Rhythm:Regular     Neuro/Psych PSYCHIATRIC DISORDERS Depression  Neuromuscular disease    GI/Hepatic GERD-  ,  Endo/Other  diabetes, Type 2Morbid obesity  Renal/GU      Musculoskeletal   Abdominal   Peds  Hematology   Anesthesia Other Findings   Reproductive/Obstetrics                             Anesthesia Physical  Anesthesia Plan  ASA: III  Anesthesia Plan: General   Post-op Pain Management:    Induction: Intravenous  Airway Management Planned: Mask  Additional Equipment:   Intra-op Plan:   Post-operative Plan:   Informed Consent: I have reviewed the patients History and Physical, chart, labs and discussed the procedure including the risks, benefits and alternatives for the proposed anesthesia with the patient or authorized representative who has indicated his/her understanding and acceptance.     Plan Discussed with:   Anesthesia Plan Comments:         Anesthesia Quick Evaluation  

## 2015-08-18 NOTE — Transfer of Care (Signed)
Immediate Anesthesia Transfer of Care Note  Patient: Melinda [name]  Procedure(s) Performed: * No procedures listed *  Patient Location: PACU  Anesthesia Type:General  Level of Consciousness: awake, alert  and oriented  Airway & Oxygen Therapy: Patient Spontanous Breathing and Patient connected to face mask oxygen  Post-op Assessment: Report given to RN and Post -op Vital signs reviewed and stable  Post vital signs: Reviewed and stable  Last Vitals:  Filed Vitals:   08/18/15 0926  BP: 123/65  Pulse: 92  Temp: 36.3 C  Resp: 16    Complications: No apparent anesthesia complications

## 2015-08-18 NOTE — Anesthesia Procedure Notes (Signed)
Date/Time: 08/18/2015 11:49 AM Performed by: Nelda Marseille Pre-anesthesia Checklist: Patient identified, Emergency Drugs available, Timeout performed, Suction available and Patient being monitored Oxygen Delivery Method: Ambu bag

## 2015-08-21 ENCOUNTER — Other Ambulatory Visit: Payer: Self-pay

## 2015-08-25 ENCOUNTER — Encounter: Payer: Self-pay | Admitting: Anesthesiology

## 2015-08-25 ENCOUNTER — Encounter
Admission: RE | Admit: 2015-08-25 | Discharge: 2015-08-25 | Disposition: A | Discharge status: Home / Self Care | Payer: Medicare PPO | Source: Ambulatory Visit | Attending: Psychiatry | Admitting: Psychiatry

## 2015-08-25 DIAGNOSIS — E119 Type 2 diabetes mellitus without complications: Secondary | ICD-10-CM | POA: Diagnosis not present

## 2015-08-25 DIAGNOSIS — E669 Obesity, unspecified: Secondary | ICD-10-CM | POA: Insufficient documentation

## 2015-08-25 DIAGNOSIS — E1142 Type 2 diabetes mellitus with diabetic polyneuropathy: Secondary | ICD-10-CM | POA: Diagnosis not present

## 2015-08-25 DIAGNOSIS — I10 Essential (primary) hypertension: Secondary | ICD-10-CM | POA: Insufficient documentation

## 2015-08-25 DIAGNOSIS — F332 Major depressive disorder, recurrent severe without psychotic features: Secondary | ICD-10-CM | POA: Diagnosis not present

## 2015-08-25 DIAGNOSIS — K219 Gastro-esophageal reflux disease without esophagitis: Secondary | ICD-10-CM | POA: Diagnosis not present

## 2015-08-25 DIAGNOSIS — E7800 Pure hypercholesterolemia, unspecified: Secondary | ICD-10-CM | POA: Diagnosis not present

## 2015-08-25 LAB — GLUCOSE, CAPILLARY
Glucose-Capillary: 95 mg/dL (ref 65–99)
Glucose-Capillary: 104 mg/dL — ABNORMAL HIGH (ref 65–99)

## 2015-08-25 MED ORDER — SUCCINYLCHOLINE CHLORIDE 20 MG/ML IJ SOLN
100.0000 mg | Freq: Once | INTRAMUSCULAR | Status: AC
  Administered 2015-08-25: 100 mg via INTRAVENOUS

## 2015-08-25 MED ORDER — GLYCOPYRROLATE 0.2 MG/ML IJ SOLN
0.4000 mg | Freq: Once | INTRAMUSCULAR | Status: AC
  Administered 2015-08-25: 0.4 mg via INTRAVENOUS

## 2015-08-25 MED ORDER — LIDOCAINE HCL (CARDIAC) 20 MG/ML IV SOLN
4.0000 mg | Freq: Once | INTRAVENOUS | Status: AC
  Administered 2015-08-25: 4 mg via INTRAVENOUS

## 2015-08-25 MED ORDER — SODIUM CHLORIDE 0.9 % IV SOLN
250.0000 mL | Freq: Once | INTRAVENOUS | Status: AC
  Administered 2015-08-25: 250 mL via INTRAVENOUS

## 2015-08-25 MED ORDER — LABETALOL HCL 5 MG/ML IV SOLN
20.0000 mg | Freq: Once | INTRAVENOUS | Status: AC
  Administered 2015-08-25: 20 mg via INTRAVENOUS

## 2015-08-25 MED ORDER — ESMOLOL HCL-SODIUM CHLORIDE 2000 MG/100ML IV SOLN: 10.0000 mg | Freq: Once | INTRAVENOUS | Status: DC

## 2015-08-25 MED ORDER — KETOROLAC TROMETHAMINE 30 MG/ML IJ SOLN
30.0000 mg | Freq: Once | INTRAMUSCULAR | Status: AC
  Administered 2015-08-25: 30 mg via INTRAVENOUS

## 2015-08-25 MED ORDER — METHOHEXITAL SODIUM 100 MG/10ML IV SOSY
70.0000 mg | PREFILLED_SYRINGE | Freq: Once | INTRAVENOUS | Status: AC
  Administered 2015-08-25: 70 mg via INTRAVENOUS

## 2015-08-25 NOTE — Procedures (Signed)
ECT SERVICES Physician's Interval Evaluation & Treatment Note  Patient Identification: Melinda [name] MRN:  [id number] Date of Evaluation:  08/25/2015 TX #: [id number]  MADRS:   MMSE:   P.E. Findings:  No new physical findings or complaints  Psychiatric Interval Note:  Mood remain stable no active suicidal plan or intent  Subjective:  Patient is a 44 y.o. female seen for evaluation for Electroconvulsive Therapy. No specific new complaints  Treatment Summary:   []   Right Unilateral             [x]  Bilateral   % Energy : 1.0 ms 35%   Impedance: 1180 ohms  Seizure Energy Index: 5939 V squared  Postictal Suppression Index: 73%  Seizure Concordance Index: 98%  Medications  Pre Shock: Robinul 0.4 mg, Xylocaine 4 mg, labetalol 20 mg, Toradol 30 mg, Brevital 70 mg, esmolol 10 mg, succinylcholine 100 mg  Post Shock:    Seizure Duration: 36 seconds by EMG, 74 seconds by EEG   Comments: Patient will be returning in 2 weeks because of scheduling. We are not having treatment next Friday.   Lungs:  [x]   Clear to auscultation               []  Other:   Heart:    [x]   Regular rhythm             []  irregular rhythm    [x]   Previous H&P reviewed, patient examined and there are NO CHANGES                 []   Previous H&P reviewed, patient examined and there are changes noted.   [name], MD 10/14/201610:55 AM

## 2015-08-25 NOTE — Transfer of Care (Signed)
Immediate Anesthesia Transfer of Care Note  Patient: Melinda [name]  Procedure(s) Performed: ECT  Patient Location: PACU  Anesthesia Type:General  Level of Consciousness: sedated  Airway & Oxygen Therapy: Patient Spontanous Breathing and Patient connected to face mask oxygen  Post-op Assessment: Report given to RN and Post -op Vital signs reviewed and stable  Post vital signs: Reviewed and stable  Last Vitals:  Filed Vitals:   08/25/15 1110  BP: 127/73  Pulse: 97  Temp: 99.72F  Resp: 14    Complications: No apparent anesthesia complications

## 2015-08-25 NOTE — Anesthesia Preprocedure Evaluation (Signed)
Anesthesia Evaluation  Patient identified by MRN, date of birth, ID band Patient awake    Reviewed: Allergy & Precautions, NPO status , Patient's Chart, lab work & pertinent test results  Airway Mallampati: IV  TM Distance: >3 FB Neck ROM: Full    Dental  (+) Teeth Intact   Pulmonary sleep apnea ,    Pulmonary exam normal        Cardiovascular hypertension, Pt. on medications Normal cardiovascular exam     Neuro/Psych Depression Bipolar Disorder    GI/Hepatic GERD  Controlled,  Endo/Other  diabetes, Type 2  Renal/GU      Musculoskeletal   Abdominal (+) + obese,  Abdomen: soft.    Peds  Hematology   Anesthesia Other Findings   Reproductive/Obstetrics                             Anesthesia Physical Anesthesia Plan  ASA: III  Anesthesia Plan: General   Post-op Pain Management:    Induction: Intravenous  Airway Management Planned: Mask  Additional Equipment:   Intra-op Plan:   Post-operative Plan:   Informed Consent: I have reviewed the patients History and Physical, chart, labs and discussed the procedure including the risks, benefits and alternatives for the proposed anesthesia with the patient or authorized representative who has indicated his/her understanding and acceptance.     Plan Discussed with: CRNA  Anesthesia Plan Comments:         Anesthesia Quick Evaluation

## 2015-08-25 NOTE — H&P (Signed)
Melinda [name] is an 44 y.o. female.   Chief Complaint: Patient receiving maintenance ECT for recurrent severe depression no new complaint. Mood doing good. No new physical problems. HPI: Since last treatment no interval change  Past Medical History  Diagnosis Date  . Depression   . Diabetes mellitus without complication ([place])   . GERD (gastroesophageal reflux disease)   . Hypercholesterolemia 03/07/15  . Hypertension   . Obesity 03/07/15  . Personality disorder 03/07/15  . Sinus tachycardia ([place]) 03/07/15    history of  . Suicidal thoughts 03/07/15  . Diabetic peripheral neuropathy ([place]) 03/07/15  . Diabetic peripheral neuropathy ([place]) 03/07/15  . Diabetic peripheral neuropathy ([place]) 03/07/15    Past Surgical History  Procedure Laterality Date  . Electroconvulsion therapy  03/07/15    Family History  Problem Relation Age of Onset  . Hypertension Father   . Diabetes Mother    Social History:  reports that she has never smoked. She has never used smokeless tobacco. She reports that she does not drink alcohol or use illicit drugs.  Allergies:  Allergies  Allergen Reactions  . Prednisone     Increases blood sugar     (Not in a hospital admission)  Results for orders placed or performed during the hospital encounter of 08/25/15 (from the past 48 hour(s))  Glucose, capillary     Status: None   Collection Time: 08/25/15  8:40 AM  Result Value Ref Range   Glucose-Capillary 95 65 - 99 mg/dL   No results found.  Review of Systems  Constitutional: Negative.   HENT: Negative.   Eyes: Negative.   Respiratory: Negative.   Cardiovascular: Negative.   Gastrointestinal: Negative.   Musculoskeletal: Negative.   Skin: Negative.   Neurological: Negative.   Psychiatric/Behavioral: Negative for depression, suicidal ideas, hallucinations, memory loss and substance abuse. The patient is nervous/anxious. The patient does not have insomnia.     Blood pressure 121/76, pulse 86,  temperature 96.5 F (35.8 C), temperature source Oral, resp. rate 20, weight 106.142 kg (234 lb), last menstrual period 08/17/2015, SpO2 100 %. Physical Exam  Nursing note and vitals reviewed. Constitutional: She appears well-developed and well-nourished.  HENT:  Head: Normocephalic and atraumatic.  Eyes: Conjunctivae are normal. Pupils are equal, round, and reactive to light.  Neck: Normal range of motion.  Cardiovascular: Normal rate, regular rhythm and normal heart sounds.   Respiratory: Effort normal and breath sounds normal. No respiratory distress. She has no wheezes.  GI: Soft.  Musculoskeletal: Normal range of motion.  Neurological: She is alert.  Skin: Skin is warm and dry.  Psychiatric: She has a normal mood and affect. Her behavior is normal. Judgment and thought content normal.     Assessment/Plan Continue with current schedule although I will be away next week so we will have 2 weeks until our follow-up treatment.  Tigerlily Christine 08/25/2015, 10:54 AM

## 2015-08-25 NOTE — Anesthesia Procedure Notes (Signed)
Date/Time: 08/25/2015 11:01 AM Performed by: Dionne Bucy Pre-anesthesia Checklist: Patient identified, Emergency Drugs available, Suction available and Patient being monitored Patient Re-evaluated:Patient Re-evaluated prior to inductionOxygen Delivery Method: Circle system utilized Preoxygenation: Pre-oxygenation with 100% oxygen Intubation Type: IV induction Ventilation: Mask ventilation without difficulty and Mask ventilation throughout procedure Airway Equipment and Method: Bite block Placement Confirmation: positive ETCO2 Dental Injury: Teeth and Oropharynx as per pre-operative assessment

## 2015-08-25 NOTE — Anesthesia Postprocedure Evaluation (Signed)
  Anesthesia Post-op Note  Patient: Melinda [name]  Procedure(s) Performed: * No procedures listed *  Anesthesia type:General  Patient location: PACU  Post pain: Pain level controlled  Post assessment: Post-op Vital signs reviewed, Patient's Cardiovascular Status Stable, Respiratory Function Stable, Patent Airway and No signs of Nausea or vomiting  Post vital signs: Reviewed and stable  Last Vitals:  Filed Vitals:   08/25/15 0854  BP: 121/76  Pulse: 86  Temp: 35.8 C  Resp: 20    Level of consciousness: awake, alert  and patient cooperative  Complications: No apparent anesthesia complications

## 2015-08-30 LAB — POCT PREGNANCY, URINE: Preg Test, Ur: NEGATIVE

## 2015-09-06 ENCOUNTER — Other Ambulatory Visit: Payer: Self-pay

## 2015-09-08 ENCOUNTER — Encounter: Payer: Self-pay | Admitting: Registered Nurse

## 2015-09-08 ENCOUNTER — Encounter
Admission: RE | Admit: 2015-09-08 | Discharge: 2015-09-08 | Disposition: A | Discharge status: Home / Self Care | Payer: Medicare PPO | Source: Ambulatory Visit | Attending: Psychiatry | Admitting: Psychiatry

## 2015-09-08 ENCOUNTER — Other Ambulatory Visit: Payer: Self-pay | Admitting: *Deleted

## 2015-09-08 DIAGNOSIS — Z6839 Body mass index (BMI) 39.0-39.9, adult: Secondary | ICD-10-CM | POA: Insufficient documentation

## 2015-09-08 DIAGNOSIS — E7800 Pure hypercholesterolemia, unspecified: Secondary | ICD-10-CM | POA: Diagnosis not present

## 2015-09-08 DIAGNOSIS — F332 Major depressive disorder, recurrent severe without psychotic features: Secondary | ICD-10-CM | POA: Insufficient documentation

## 2015-09-08 DIAGNOSIS — K219 Gastro-esophageal reflux disease without esophagitis: Secondary | ICD-10-CM | POA: Insufficient documentation

## 2015-09-08 DIAGNOSIS — E669 Obesity, unspecified: Secondary | ICD-10-CM | POA: Diagnosis not present

## 2015-09-08 DIAGNOSIS — E1142 Type 2 diabetes mellitus with diabetic polyneuropathy: Secondary | ICD-10-CM | POA: Diagnosis not present

## 2015-09-08 DIAGNOSIS — I10 Essential (primary) hypertension: Secondary | ICD-10-CM | POA: Insufficient documentation

## 2015-09-08 LAB — GLUCOSE, CAPILLARY: Glucose-Capillary: 98 mg/dL (ref 65–99)

## 2015-09-08 MED ORDER — SUCCINYLCHOLINE CHLORIDE 20 MG/ML IJ SOLN
100.0000 mg | Freq: Once | INTRAMUSCULAR | Status: AC
  Administered 2015-09-08: 100 mg via INTRAVENOUS

## 2015-09-08 MED ORDER — LIDOCAINE HCL (CARDIAC) 20 MG/ML IV SOLN
4.0000 mg | Freq: Once | INTRAVENOUS | Status: AC
  Administered 2015-09-08: 4 mg via INTRAVENOUS

## 2015-09-08 MED ORDER — ESMOLOL HCL-SODIUM CHLORIDE 2000 MG/100ML IV SOLN
10.0000 mg | Freq: Once | INTRAVENOUS | Status: AC
Start: 2015-09-08 — End: 2015-09-08
  Administered 2015-09-08: 10 mg via INTRAVENOUS

## 2015-09-08 MED ORDER — SODIUM CHLORIDE 0.9 % IV SOLN
250.0000 mL | Freq: Once | INTRAVENOUS | Status: AC
  Administered 2015-09-08: 250 mL via INTRAVENOUS

## 2015-09-08 MED ORDER — KETOROLAC TROMETHAMINE 30 MG/ML IJ SOLN
30.0000 mg | Freq: Once | INTRAMUSCULAR | Status: AC
  Administered 2015-09-08: 30 mg via INTRAVENOUS

## 2015-09-08 MED ORDER — GLYCOPYRROLATE 0.2 MG/ML IJ SOLN
0.4000 mg | Freq: Once | INTRAMUSCULAR | Status: AC
  Administered 2015-09-08: 0.4 mg via INTRAVENOUS

## 2015-09-08 MED ORDER — LABETALOL HCL 5 MG/ML IV SOLN
20.0000 mg | Freq: Once | INTRAVENOUS | Status: AC
  Administered 2015-09-08: 20 mg via INTRAVENOUS

## 2015-09-08 MED ORDER — METHOHEXITAL SODIUM 100 MG/10ML IV SOSY
70.0000 mg | PREFILLED_SYRINGE | Freq: Once | INTRAVENOUS | Status: AC
  Administered 2015-09-08: 70 mg via INTRAVENOUS

## 2015-09-08 NOTE — H&P (Signed)
Melinda [name] is an 44 y.o. female.   Chief Complaint: No specific new complaint. Physically doing well. Mood has been actually fairly good HPI: Patient with chronic severe depression who has been maintained with weekly maintenance ECT treatment  Past Medical History  Diagnosis Date  . Depression   . Diabetes mellitus without complication ([place])   . GERD (gastroesophageal reflux disease)   . Hypercholesterolemia 03/07/15  . Hypertension   . Obesity 03/07/15  . Personality disorder 03/07/15  . Sinus tachycardia ([place]) 03/07/15    history of  . Suicidal thoughts 03/07/15  . Diabetic peripheral neuropathy ([place]) 03/07/15  . Diabetic peripheral neuropathy ([place]) 03/07/15  . Diabetic peripheral neuropathy ([place]) 03/07/15    Past Surgical History  Procedure Laterality Date  . Electroconvulsion therapy  03/07/15    Family History  Problem Relation Age of Onset  . Hypertension Father   . Diabetes Mother    Social History:  reports that she has never smoked. She has never used smokeless tobacco. She reports that she does not drink alcohol or use illicit drugs.  Allergies:  Allergies  Allergen Reactions  . Prednisone     Increases blood sugar     (Not in a hospital admission)  Results for orders placed or performed during the hospital encounter of 09/08/15 (from the past 48 hour(s))  Glucose, capillary     Status: None   Collection Time: 09/08/15  8:50 AM  Result Value Ref Range   Glucose-Capillary 98 65 - 99 mg/dL   Comment 1 Notify RN    No results found.  Review of Systems  Constitutional: Negative.   HENT: Negative.   Eyes: Negative.   Respiratory: Negative.   Cardiovascular: Negative.   Gastrointestinal: Negative.   Musculoskeletal: Negative.   Skin: Negative.   Neurological: Negative.   Psychiatric/Behavioral: Negative for depression, suicidal ideas, hallucinations, memory loss and substance abuse. The patient is not nervous/anxious and does not have insomnia.      Blood pressure 125/84, pulse 84, temperature 96.5 F (35.8 C), temperature source Tympanic, resp. rate 18, height 5\' 4"  (1.626 m), weight 105.235 kg (232 lb), last menstrual period 08/17/2015, SpO2 100 %. Physical Exam  Constitutional: She appears well-developed and well-nourished.  HENT:  Head: Normocephalic and atraumatic.  Eyes: Conjunctivae are normal. Pupils are equal, round, and reactive to light.  Neck: Normal range of motion.  Cardiovascular: Normal rate, regular rhythm and normal heart sounds.   Respiratory: Effort normal and breath sounds normal. No respiratory distress. She has no wheezes.  GI: Soft.  Musculoskeletal: Normal range of motion.  Neurological: She is alert.  Skin: Skin is warm and dry.  Psychiatric: She has a normal mood and affect. Her behavior is normal. Judgment and thought content normal.     Assessment/Plan We will anticipate continuing with the weekly treatment schedule although I am pleased to see that we have gone 2 weeks since our last treatment and she is still doing well and we may consider that into the future.  Kaydyn Chism 09/08/2015, 10:45 AM

## 2015-09-08 NOTE — Anesthesia Preprocedure Evaluation (Signed)
Anesthesia Evaluation  Patient identified by MRN, date of birth, ID band Patient awake    Reviewed: Allergy & Precautions, NPO status , Patient's Chart, lab work & pertinent test results  Airway Mallampati: IV  TM Distance: >3 FB Neck ROM: Full    Dental  (+) Teeth Intact   Pulmonary sleep apnea ,    Pulmonary exam normal        Cardiovascular Exercise Tolerance: Good hypertension, Normal cardiovascular exam     Neuro/Psych Depression Bipolar Disorder    GI/Hepatic GERD  Controlled,  Endo/Other  diabetes, Type 2BG 98.  Renal/GU      Musculoskeletal   Abdominal (+) + obese,  Abdomen: soft.    Peds  Hematology   Anesthesia Other Findings   Reproductive/Obstetrics                             Anesthesia Physical Anesthesia Plan  ASA: III  Anesthesia Plan: General   Post-op Pain Management:    Induction: Intravenous  Airway Management Planned: Mask  Additional Equipment:   Intra-op Plan:   Post-operative Plan:   Informed Consent: I have reviewed the patients History and Physical, chart, labs and discussed the procedure including the risks, benefits and alternatives for the proposed anesthesia with the patient or authorized representative who has indicated his/her understanding and acceptance.     Plan Discussed with: CRNA  Anesthesia Plan Comments:         Anesthesia Quick Evaluation

## 2015-09-08 NOTE — Transfer of Care (Signed)
Immediate Anesthesia Transfer of Care Note  Patient: Melinda [name]  Procedure(s) Performed: * No procedures listed *  Patient Location: PACU  Anesthesia Type:General  Level of Consciousness: sedated  Airway & Oxygen Therapy: Patient Spontanous Breathing and Patient connected to face mask oxygen  Post-op Assessment: Report given to RN and Post -op Vital signs reviewed and stable  Post vital signs: Reviewed and stable  Last Vitals:  Filed Vitals:   09/08/15 1104  BP: 125/74  Pulse: 95  Temp: 37.3 C  Resp: 23    Complications: No apparent anesthesia complications

## 2015-09-08 NOTE — Anesthesia Postprocedure Evaluation (Signed)
  Anesthesia Post-op Note  Patient: Melinda [name]  Procedure(s) Performed: * No procedures listed *  Anesthesia type:General  Patient location: PACU  Post pain: Pain level controlled  Post assessment: Post-op Vital signs reviewed, Patient's Cardiovascular Status Stable, Respiratory Function Stable, Patent Airway and No signs of Nausea or vomiting  Post vital signs: Reviewed and stable  Last Vitals:  Filed Vitals:   09/08/15 1124  BP: 108/73  Pulse: 90  Temp:   Resp: 22    Level of consciousness: awake, alert  and patient cooperative  Complications: No apparent anesthesia complications

## 2015-09-08 NOTE — Anesthesia Procedure Notes (Signed)
Date/Time: 09/08/2015 10:47 AM Performed by: Doreen Salvage Pre-anesthesia Checklist: Patient identified, Emergency Drugs available, Suction available and Patient being monitored Patient Re-evaluated:Patient Re-evaluated prior to inductionOxygen Delivery Method: Circle system utilized Preoxygenation: Pre-oxygenation with 100% oxygen Intubation Type: IV induction Ventilation: Mask ventilation without difficulty and Mask ventilation throughout procedure Airway Equipment and Method: Bite block Placement Confirmation: positive ETCO2 Dental Injury: Teeth and Oropharynx as per pre-operative assessment

## 2015-09-08 NOTE — Procedures (Signed)
ECT SERVICES Physician's Interval Evaluation & Treatment Note  Patient Identification: Melinda [name] MRN:  [id number] Date of Evaluation:  09/08/2015 TX #: [id number]  MADRS:   MMSE:   P.E. Findings: No new physical complaints no change to physical exam  Psychiatric Interval Note:  Mood is actually feeling pretty good which is inspiring. Enjoyed some time with her family. No suicidal ideation.  Subjective:  Patient is a 44 y.o. female seen for evaluation for Electroconvulsive Therapy. No specific new complaints  Treatment Summary:   []   Right Unilateral             [x]  Bilateral   % Energy : 1.0 ms 35%   Impedance: 1330 ohms  Seizure Energy Index: 6720 V squared  Postictal Suppression Index: 85%  Seizure Concordance Index: 97%  Medications  Pre Shock: Robinul 0.4 mg, Xylocaine 4 mg, Toradol 30 mg, labetalol 20 mg, Brevital 70 mg, esmolol 10 mg, succinylcholine 100 mg  Post Shock:    Seizure Duration: 33 seconds by EMG, 70 seconds by EEG   Comments: Follow-up in one week on November 4   Lungs:  [x]   Clear to auscultation               []  Other:   Heart:    [x]   Regular rhythm             []  irregular rhythm    [x]   Previous H&P reviewed, patient examined and there are NO CHANGES                 []   Previous H&P reviewed, patient examined and there are changes noted.   [name], MD 10/28/201610:47 AM

## 2015-09-15 ENCOUNTER — Encounter: Payer: Self-pay | Admitting: *Deleted

## 2015-09-15 ENCOUNTER — Encounter
Admission: RE | Admit: 2015-09-15 | Discharge: 2015-09-15 | Disposition: A | Discharge status: Home / Self Care | Payer: Medicare PPO | Source: Ambulatory Visit | Attending: Psychiatry | Admitting: Psychiatry

## 2015-09-15 DIAGNOSIS — Z6839 Body mass index (BMI) 39.0-39.9, adult: Secondary | ICD-10-CM | POA: Diagnosis not present

## 2015-09-15 DIAGNOSIS — I10 Essential (primary) hypertension: Secondary | ICD-10-CM | POA: Diagnosis not present

## 2015-09-15 DIAGNOSIS — E669 Obesity, unspecified: Secondary | ICD-10-CM | POA: Insufficient documentation

## 2015-09-15 DIAGNOSIS — F609 Personality disorder, unspecified: Secondary | ICD-10-CM | POA: Insufficient documentation

## 2015-09-15 DIAGNOSIS — E1142 Type 2 diabetes mellitus with diabetic polyneuropathy: Secondary | ICD-10-CM | POA: Insufficient documentation

## 2015-09-15 DIAGNOSIS — E119 Type 2 diabetes mellitus without complications: Secondary | ICD-10-CM | POA: Diagnosis not present

## 2015-09-15 DIAGNOSIS — E7800 Pure hypercholesterolemia, unspecified: Secondary | ICD-10-CM | POA: Diagnosis not present

## 2015-09-15 DIAGNOSIS — F332 Major depressive disorder, recurrent severe without psychotic features: Secondary | ICD-10-CM | POA: Insufficient documentation

## 2015-09-15 DIAGNOSIS — K219 Gastro-esophageal reflux disease without esophagitis: Secondary | ICD-10-CM | POA: Insufficient documentation

## 2015-09-15 LAB — GLUCOSE, CAPILLARY: Glucose-Capillary: 75 mg/dL (ref 65–99)

## 2015-09-15 LAB — POCT PREGNANCY, URINE: Preg Test, Ur: NEGATIVE

## 2015-09-15 MED ORDER — ESMOLOL HCL-SODIUM CHLORIDE 2000 MG/100ML IV SOLN
10.0000 mg | Freq: Once | INTRAVENOUS | Status: AC
  Administered 2015-09-15: 10 mg via INTRAVENOUS

## 2015-09-15 MED ORDER — LABETALOL HCL 5 MG/ML IV SOLN
20.0000 mg | Freq: Once | INTRAVENOUS | Status: AC
  Administered 2015-09-15: 20 mg via INTRAVENOUS

## 2015-09-15 MED ORDER — KETOROLAC TROMETHAMINE 30 MG/ML IJ SOLN
30.0000 mg | Freq: Once | INTRAMUSCULAR | Status: AC
  Administered 2015-09-15: 30 mg via INTRAVENOUS

## 2015-09-15 MED ORDER — LIDOCAINE HCL (CARDIAC) 20 MG/ML IV SOLN
4.0000 mg | Freq: Once | INTRAVENOUS | Status: AC
  Administered 2015-09-15: 4 mg via INTRAVENOUS

## 2015-09-15 MED ORDER — METHOHEXITAL SODIUM 100 MG/10ML IV SOSY
70.0000 mg | PREFILLED_SYRINGE | Freq: Once | INTRAVENOUS | Status: AC
  Administered 2015-09-15: 70 mg via INTRAVENOUS

## 2015-09-15 MED ORDER — SUCCINYLCHOLINE CHLORIDE 20 MG/ML IJ SOLN
100.0000 mg | Freq: Once | INTRAMUSCULAR | Status: AC
  Administered 2015-09-15: 100 mg via INTRAVENOUS

## 2015-09-15 MED ORDER — GLYCOPYRROLATE 0.2 MG/ML IJ SOLN
0.4000 mg | Freq: Once | INTRAMUSCULAR | Status: AC
  Administered 2015-09-15: 0.4 mg via INTRAVENOUS

## 2015-09-15 MED ORDER — SODIUM CHLORIDE 0.9 % IV SOLN
250.0000 mL | Freq: Once | INTRAVENOUS | Status: AC
  Administered 2015-09-15: 250 mL via INTRAVENOUS

## 2015-09-15 NOTE — H&P (Signed)
Melinda [name] is an 44 y.o. female.   Chief Complaint: Patient is here for a regularly scheduled maintenance ECT HPI: Since last time she has had a couple incidents of having a low blood sugar. Unclear why this is happening. I encouraged her to stay on her usual diet and consider follow-up with her endocrinologist no change to mood  Past Medical History  Diagnosis Date  . Depression   . Diabetes mellitus without complication ([place])   . GERD (gastroesophageal reflux disease)   . Hypercholesterolemia 03/07/15  . Hypertension   . Obesity 03/07/15  . Personality disorder 03/07/15  . Sinus tachycardia ([place]) 03/07/15    history of  . Suicidal thoughts 03/07/15  . Diabetic peripheral neuropathy ([place]) 03/07/15  . Diabetic peripheral neuropathy ([place]) 03/07/15  . Diabetic peripheral neuropathy ([place]) 03/07/15    Past Surgical History  Procedure Laterality Date  . Electroconvulsion therapy  03/07/15    Family History  Problem Relation Age of Onset  . Hypertension Father   . Diabetes Mother    Social History:  reports that she has never smoked. She has never used smokeless tobacco. She reports that she does not drink alcohol or use illicit drugs.  Allergies:  Allergies  Allergen Reactions  . Prednisone     Increases blood sugar     (Not in a hospital admission)  Results for orders placed or performed during the hospital encounter of 09/15/15 (from the past 48 hour(s))  Pregnancy, urine POC     Status: None   Collection Time: 09/15/15  9:22 AM  Result Value Ref Range   Preg Test, Ur NEGATIVE NEGATIVE    Comment:        THE SENSITIVITY OF THIS METHODOLOGY IS >24 mIU/mL   Glucose, capillary     Status: None   Collection Time: 09/15/15  9:39 AM  Result Value Ref Range   Glucose-Capillary 75 65 - 99 mg/dL   Comment 1 Notify RN    No results found.  Review of Systems  Constitutional: Negative.   HENT: Negative.   Eyes: Negative.   Respiratory: Negative.   Cardiovascular:  Negative.   Gastrointestinal: Negative.   Musculoskeletal: Negative.   Skin: Negative.   Neurological: Negative.   Psychiatric/Behavioral: Negative for depression, suicidal ideas, hallucinations, memory loss and substance abuse. The patient is not nervous/anxious and does not have insomnia.     Blood pressure 129/85, pulse 85, temperature 96.9 F (36.1 C), temperature source Tympanic, weight 106.142 kg (234 lb), last menstrual period 08/17/2015, SpO2 98 %. Physical Exam  Nursing note and vitals reviewed. Constitutional: She appears well-developed and well-nourished.  HENT:  Head: Normocephalic and atraumatic.  Eyes: Conjunctivae are normal. Pupils are equal, round, and reactive to light.  Neck: Normal range of motion.  Cardiovascular: Regular rhythm and normal heart sounds.   Respiratory: Effort normal and breath sounds normal. No respiratory distress. She has no wheezes.  GI: Soft.  Musculoskeletal: Normal range of motion.  Neurological: She is alert.  Skin: Skin is warm and dry.  Psychiatric: She has a normal mood and affect. Her behavior is normal. Judgment and thought content normal.     Assessment/Plan Patient will continue with outpatient maintenance treatment next one scheduled next week on the 11th  [place] 09/15/2015, 11:06 AM

## 2015-09-15 NOTE — Procedures (Signed)
ECT SERVICES Physician's Interval Evaluation & Treatment Note  Patient Identification: Melinda [name] MRN:  [id number] Date of Evaluation:  09/15/2015 TX #: [id number]  MADRS:   MMSE:   P.E. Findings:  No change to physical exam  Psychiatric Interval Note:  Mood is stable no suicidal ideation no psychosis  Subjective:  Patient is a 44 y.o. female seen for evaluation for Electroconvulsive Therapy. No current new complaint  Treatment Summary:   []   Right Unilateral             [x]  Bilateral   % Energy : 1.0 ms, 35%   Impedance: 1410 ohms  Seizure Energy Index: 2721 V squared  Postictal Suppression Index: 94%  Seizure Concordance Index: 95%  Medications  Pre Shock: Robinul 0.4 mg, Xylocaine 4 mg, labetalol 20 mg, Toradol 30 mg, esmolol 10 mg, Brevital 70 mg, succinylcholine 100 mg  Post Shock: None  Seizure Duration: 44 seconds by EMG, 61 seconds by EEG   Comments: Follow-up one week on the 11th as scheduled   Lungs:  [x]   Clear to auscultation               []  Other:   Heart:    [x]   Regular rhythm             []  irregular rhythm    [x]   Previous H&P reviewed, patient examined and there are NO CHANGES                 []   Previous H&P reviewed, patient examined and there are changes noted.   [name], MD 11/4/201611:08 AM

## 2015-09-15 NOTE — Anesthesia Preprocedure Evaluation (Signed)
Anesthesia Evaluation  Patient identified by MRN, date of birth, ID band Patient awake    Reviewed: Allergy & Precautions, NPO status , Patient's Chart, lab work & pertinent test results  Airway Mallampati: IV  TM Distance: >3 FB Neck ROM: Full    Dental  (+) Teeth Intact   Pulmonary sleep apnea and Continuous Positive Airway Pressure Ventilation ,    Pulmonary exam normal        Cardiovascular Exercise Tolerance: Good hypertension, Pt. on medications Normal cardiovascular exam     Neuro/Psych    GI/Hepatic Denies GERD now--no longer on meds for it.   Endo/Other  diabetes, Type 2  Renal/GU      Musculoskeletal   Abdominal (+) + obese,  Abdomen: soft.    Peds  Hematology   Anesthesia Other Findings   Reproductive/Obstetrics                             Anesthesia Physical Anesthesia Plan  ASA: III  Anesthesia Plan: General   Post-op Pain Management:    Induction: Intravenous  Airway Management Planned: Mask  Additional Equipment:   Intra-op Plan:   Post-operative Plan:   Informed Consent: I have reviewed the patients History and Physical, chart, labs and discussed the procedure including the risks, benefits and alternatives for the proposed anesthesia with the patient or authorized representative who has indicated his/her understanding and acceptance.     Plan Discussed with: CRNA  Anesthesia Plan Comments:         Anesthesia Quick Evaluation

## 2015-09-15 NOTE — Transfer of Care (Signed)
Immediate Anesthesia Transfer of Care Note  Patient: Melinda [name]  Procedure(s) Performed: ECT  Patient Location: PACU  Anesthesia Type:General  Level of Consciousness: awake and patient cooperative  Airway & Oxygen Therapy: Patient Spontanous Breathing and Patient connected to face mask oxygen  Post-op Assessment: Report given to RN and Post -op Vital signs reviewed and stable  Post vital signs: Reviewed and stable  Last Vitals:  Filed Vitals:   09/15/15 1129  BP: 145/75  Pulse: 98  Temp: 37.3 C  Resp: 16    Complications: No apparent anesthesia complications

## 2015-09-15 NOTE — Anesthesia Postprocedure Evaluation (Signed)
  Anesthesia Post-op Note  Patient: Melinda [name]  Procedure(s) Performed: * No procedures listed *  Anesthesia type:General  Patient location: PACU  Post pain: Pain level controlled  Post assessment: Post-op Vital signs reviewed, Patient's Cardiovascular Status Stable, Respiratory Function Stable, Patent Airway and No signs of Nausea or vomiting  Post vital signs: Reviewed and stable  Last Vitals:  Filed Vitals:   09/15/15 1129  BP: 145/75  Pulse: 98  Temp: 37.3 C  Resp: 16    Level of consciousness: awake, alert  and patient cooperative  Complications: No apparent anesthesia complications

## 2015-09-15 NOTE — Anesthesia Procedure Notes (Signed)
Date/Time: 09/15/2015 11:18 AM Performed by: Dionne Bucy Pre-anesthesia Checklist: Patient identified, Emergency Drugs available, Suction available and Patient being monitored Patient Re-evaluated:Patient Re-evaluated prior to inductionOxygen Delivery Method: Circle system utilized Preoxygenation: Pre-oxygenation with 100% oxygen Intubation Type: IV induction Ventilation: Mask ventilation without difficulty and Mask ventilation throughout procedure Airway Equipment and Method: Bite block Placement Confirmation: positive ETCO2 Dental Injury: Teeth and Oropharynx as per pre-operative assessment

## 2015-09-20 ENCOUNTER — Other Ambulatory Visit: Payer: Self-pay

## 2015-09-22 ENCOUNTER — Encounter
Admission: RE | Admit: 2015-09-22 | Discharge: 2015-09-22 | Disposition: A | Discharge status: Home / Self Care | Payer: Medicare PPO | Source: Ambulatory Visit | Attending: Psychiatry | Admitting: Psychiatry

## 2015-09-22 ENCOUNTER — Encounter: Payer: Self-pay | Admitting: Certified Registered Nurse Anesthetist

## 2015-09-22 DIAGNOSIS — E7800 Pure hypercholesterolemia, unspecified: Secondary | ICD-10-CM | POA: Insufficient documentation

## 2015-09-22 DIAGNOSIS — I10 Essential (primary) hypertension: Secondary | ICD-10-CM | POA: Diagnosis not present

## 2015-09-22 DIAGNOSIS — E119 Type 2 diabetes mellitus without complications: Secondary | ICD-10-CM | POA: Diagnosis not present

## 2015-09-22 DIAGNOSIS — E669 Obesity, unspecified: Secondary | ICD-10-CM | POA: Diagnosis not present

## 2015-09-22 DIAGNOSIS — K219 Gastro-esophageal reflux disease without esophagitis: Secondary | ICD-10-CM | POA: Diagnosis not present

## 2015-09-22 DIAGNOSIS — F609 Personality disorder, unspecified: Secondary | ICD-10-CM | POA: Diagnosis not present

## 2015-09-22 DIAGNOSIS — E1142 Type 2 diabetes mellitus with diabetic polyneuropathy: Secondary | ICD-10-CM | POA: Insufficient documentation

## 2015-09-22 DIAGNOSIS — F332 Major depressive disorder, recurrent severe without psychotic features: Secondary | ICD-10-CM | POA: Insufficient documentation

## 2015-09-22 LAB — GLUCOSE, CAPILLARY: Glucose-Capillary: 77 mg/dL (ref 65–99)

## 2015-09-22 LAB — POCT PREGNANCY, URINE: Preg Test, Ur: NEGATIVE

## 2015-09-22 MED ORDER — SUCCINYLCHOLINE CHLORIDE 20 MG/ML IJ SOLN
100.0000 mg | Freq: Once | INTRAMUSCULAR | Status: AC
  Administered 2015-09-22: 100 mg via INTRAVENOUS

## 2015-09-22 MED ORDER — FENTANYL CITRATE (PF) 100 MCG/2ML IJ SOLN: 25.0000 ug | INTRAMUSCULAR | Status: DC | PRN

## 2015-09-22 MED ORDER — ESMOLOL HCL-SODIUM CHLORIDE 2000 MG/100ML IV SOLN
10.0000 mg | Freq: Once | INTRAVENOUS | Status: AC
  Administered 2015-09-22: 10 mg via INTRAVENOUS

## 2015-09-22 MED ORDER — LABETALOL HCL 5 MG/ML IV SOLN
20.0000 mg | Freq: Once | INTRAVENOUS | Status: AC
  Administered 2015-09-22: 20 mg via INTRAVENOUS

## 2015-09-22 MED ORDER — LIDOCAINE HCL (CARDIAC) 20 MG/ML IV SOLN
4.0000 mg | Freq: Once | INTRAVENOUS | Status: AC
  Administered 2015-09-22: 4 mg via INTRAVENOUS

## 2015-09-22 MED ORDER — METHOHEXITAL SODIUM 100 MG/10ML IV SOSY
70.0000 mg | PREFILLED_SYRINGE | Freq: Once | INTRAVENOUS | Status: AC
  Administered 2015-09-22: 70 mg via INTRAVENOUS

## 2015-09-22 MED ORDER — SODIUM CHLORIDE 0.9 % IV SOLN
250.0000 mL | Freq: Once | INTRAVENOUS | Status: AC
  Administered 2015-09-22: 250 mL via INTRAVENOUS

## 2015-09-22 MED ORDER — KETOROLAC TROMETHAMINE 30 MG/ML IJ SOLN
30.0000 mg | Freq: Once | INTRAMUSCULAR | Status: AC
  Administered 2015-09-22: 30 mg via INTRAVENOUS

## 2015-09-22 MED ORDER — ONDANSETRON HCL 4 MG/2ML IJ SOLN: 4.0000 mg | Freq: Once | INTRAMUSCULAR | Status: DC | PRN

## 2015-09-22 MED ORDER — GLYCOPYRROLATE 0.2 MG/ML IJ SOLN
0.4000 mg | Freq: Once | INTRAMUSCULAR | Status: AC
  Administered 2015-09-22: 0.4 mg via INTRAVENOUS

## 2015-09-22 NOTE — Transfer of Care (Signed)
Immediate Anesthesia Transfer of Care Note  Patient: Melinda [name]  Procedure(s) Performed: * No procedures listed *  Patient Location: PACU  Anesthesia Type:General  Level of Consciousness: awake and alert   Airway & Oxygen Therapy: Patient Spontanous Breathing and Patient connected to face mask oxygen  Post-op Assessment: Report given to RN and Post -op Vital signs reviewed and stable  Post vital signs: Reviewed and stable  Last Vitals:  Filed Vitals:   09/22/15 1237  BP:   Pulse:   Temp: [id number] C    Complications: No apparent anesthesia complications

## 2015-09-22 NOTE — Procedures (Signed)
ECT SERVICES Physician's Interval Evaluation & Treatment Note  Patient Identification: Melinda [name] MRN:  [id number] Date of Evaluation:  09/22/2015 TX #: [id number]  MADRS:   MMSE:   P.E. Findings:  No new physical complaints or problems observed  Psychiatric Interval Note:  Mood is staying quite good no suicidal thoughts upbeat affect and mood  Subjective:  Patient is a 44 y.o. female seen for evaluation for Electroconvulsive Therapy. No subjective complaints  Treatment Summary:   []   Right Unilateral             [x]  Bilateral   % Energy : 1.0 ms 35%   Impedance: 1640 ohms  Seizure Energy Index: 2598 V squared  Postictal Suppression Index: 92%  Seizure Concordance Index: 92%  Medications  Pre Shock: Robinul 0.4 mg, Xylocaine 4 mg, labetalol 20 mg, Toradol 30 mg, Brevital 70 mg, esmolol 10 mg, succinylcholine 100 mg  Post Shock: None  Seizure Duration: 33 seconds by EMG, 65 seconds by EEG   Comments: Follow-up one week on November 18   Lungs:  [x]   Clear to auscultation               []  Other:   Heart:    [x]   Regular rhythm             []  irregular rhythm    [x]   Previous H&P reviewed, patient examined and there are NO CHANGES                 []   Previous H&P reviewed, patient examined and there are changes noted.   [name], MD 11/11/201612:21 PM

## 2015-09-22 NOTE — Transfer of Care (Deleted)
Immediate Anesthesia Transfer of Care Note  Patient: Melinda [name]  Procedure(s) Performed: * No procedures listed *  Patient Location: PACU  Anesthesia Type:General  Level of Consciousness: awake and alert   Airway & Oxygen Therapy: Patient Spontanous Breathing and Patient connected to face mask oxygen  Post-op Assessment: Report given to RN and Post -op Vital signs reviewed and stable  Post vital signs: Reviewed and stable  Last Vitals:  Filed Vitals:   09/22/15 0955  BP: 118/54  Pulse: 83  Temp: 0000000 C    Complications: No apparent anesthesia complications

## 2015-09-22 NOTE — H&P (Signed)
Melinda [name] is an 44 y.o. female.   Chief Complaint: Patient has no new complaint. Mood is good. No new physical concerns. HPI: Continue maintenance ECT treatment largely on a weekly basis. Mood is been under much better control no suicidal thoughts  Past Medical History  Diagnosis Date  . Depression   . Diabetes mellitus without complication ([place])   . GERD (gastroesophageal reflux disease)   . Hypercholesterolemia 03/07/15  . Hypertension   . Obesity 03/07/15  . Personality disorder 03/07/15  . Sinus tachycardia ([employer]) 03/07/15    history of  . Suicidal thoughts 03/07/15  . Diabetic peripheral neuropathy ([place]) 03/07/15  . Diabetic peripheral neuropathy ([place]) 03/07/15  . Diabetic peripheral neuropathy ([place]) 03/07/15    Past Surgical History  Procedure Laterality Date  . Electroconvulsion therapy  03/07/15    Family History  Problem Relation Age of Onset  . Hypertension Father   . Diabetes Mother    Social History:  reports that she has never smoked. She has never used smokeless tobacco. She reports that she does not drink alcohol or use illicit drugs.  Allergies:  Allergies  Allergen Reactions  . Prednisone     Increases blood sugar     (Not in a hospital admission)  Results for orders placed or performed during the hospital encounter of 09/22/15 (from the past 48 hour(s))  Pregnancy, urine POC     Status: None   Collection Time: 09/22/15 10:00 AM  Result Value Ref Range   Preg Test, Ur NEGATIVE NEGATIVE    Comment:        THE SENSITIVITY OF THIS METHODOLOGY IS >24 mIU/mL   Glucose, capillary     Status: None   Collection Time: 09/22/15 10:04 AM  Result Value Ref Range   Glucose-Capillary 77 65 - 99 mg/dL   Comment 1 Notify RN    No results found.  Review of Systems  Constitutional: Negative.   HENT: Negative.   Eyes: Negative.   Respiratory: Negative.   Cardiovascular: Negative.   Gastrointestinal: Negative.   Musculoskeletal: Negative.   Skin:  Negative.   Neurological: Negative.   Psychiatric/Behavioral: Negative for depression, suicidal ideas, hallucinations, memory loss and substance abuse. The patient is not nervous/anxious and does not have insomnia.     Blood pressure 118/54, pulse 83, temperature 98.9 F (37.2 C), temperature source Oral, weight 105.688 kg (233 lb), SpO2 98 %. Physical Exam  Nursing note and vitals reviewed. Constitutional: She appears well-developed and well-nourished.  HENT:  Head: Normocephalic and atraumatic.  Eyes: Conjunctivae are normal. Pupils are equal, round, and reactive to light.  Neck: Normal range of motion.  Cardiovascular: Normal rate, regular rhythm and normal heart sounds.   Respiratory: Effort normal and breath sounds normal. No respiratory distress. She has no wheezes.  GI: Soft.  Musculoskeletal: Normal range of motion.  Neurological: She is alert.  Skin: Skin is warm and dry.  Psychiatric: She has a normal mood and affect. Her behavior is normal. Judgment and thought content normal.     Assessment/Plan Tolerating treatment well. Continue weekly for the most part. See her back next Friday the [place] 09/22/2015, 12:18 PM

## 2015-09-22 NOTE — Anesthesia Procedure Notes (Signed)
Performed by: Deshaun Schou Pre-anesthesia Checklist: Patient identified, Patient being monitored, Timeout performed, Emergency Drugs available and Suction available Patient Re-evaluated:Patient Re-evaluated prior to inductionOxygen Delivery Method: Circle system utilized Preoxygenation: Pre-oxygenation with 100% oxygen Ventilation: Mask ventilation without difficulty Airway Equipment and Method: Bite block Dental Injury: Teeth and Oropharynx as per pre-operative assessment        

## 2015-09-22 NOTE — Anesthesia Preprocedure Evaluation (Addendum)
Anesthesia Evaluation  Patient identified by MRN, date of birth, ID band Patient awake    Reviewed: Allergy & Precautions, NPO status , Patient's Chart, lab work & pertinent test results  Airway Mallampati: III  TM Distance: >3 FB Neck ROM: Full    Dental  (+) Teeth Intact   Pulmonary sleep apnea and Continuous Positive Airway Pressure Ventilation ,    Pulmonary exam normal        Cardiovascular Exercise Tolerance: Good hypertension, Pt. on medications Normal cardiovascular exam  Cardiomyopathy HX   Neuro/Psych PSYCHIATRIC DISORDERS Depression Bipolar Disorder Peripheral neuropathy  Neuromuscular disease    GI/Hepatic Neg liver ROS, GERD  Medicated and Controlled,Denies GERD now--no longer on meds for it.   Endo/Other  diabetes, Well Controlled, Type 2  Renal/GU negative Renal ROS  negative genitourinary   Musculoskeletal negative musculoskeletal ROS (+)   Abdominal (+) + obese,  Abdomen: soft.    Peds negative pediatric ROS (+)  Hematology negative hematology ROS (+)   Anesthesia Other Findings obesity  Reproductive/Obstetrics                            Anesthesia Physical Anesthesia Plan  ASA: III  Anesthesia Plan: General   Post-op Pain Management:    Induction: Intravenous  Airway Management Planned: Mask  Additional Equipment:   Intra-op Plan:   Post-operative Plan:   Informed Consent: I have reviewed the patients History and Physical, chart, labs and discussed the procedure including the risks, benefits and alternatives for the proposed anesthesia with the patient or authorized representative who has indicated his/her understanding and acceptance.   Dental advisory given  Plan Discussed with: CRNA and Surgeon  Anesthesia Plan Comments:         Anesthesia Quick Evaluation

## 2015-09-23 NOTE — Anesthesia Postprocedure Evaluation (Signed)
  Anesthesia Post-op Note  Patient: Melinda [name]  Procedure(s) Performed: * No procedures listed *  Anesthesia type:General  Patient location: PACU  Post pain: Pain level controlled  Post assessment: Post-op Vital signs reviewed, Patient's Cardiovascular Status Stable, Respiratory Function Stable, Patent Airway and No signs of Nausea or vomiting  Post vital signs: Reviewed and stable  Last Vitals:  Filed Vitals:   09/22/15 1317  BP: 126/64  Pulse: 87  Temp:   Resp: 18    Level of consciousness: awake, alert  and patient cooperative  Complications: No apparent anesthesia complications

## 2015-09-25 DIAGNOSIS — F330 Major depressive disorder, recurrent, mild: Secondary | ICD-10-CM | POA: Diagnosis not present

## 2015-09-27 ENCOUNTER — Other Ambulatory Visit: Payer: Self-pay

## 2015-09-29 ENCOUNTER — Encounter
Admission: RE | Admit: 2015-09-29 | Discharge: 2015-09-29 | Disposition: A | Discharge status: Home / Self Care | Payer: Medicare PPO | Source: Ambulatory Visit | Attending: Psychiatry | Admitting: Psychiatry

## 2015-09-29 ENCOUNTER — Encounter: Payer: Medicare PPO | Admitting: Anesthesiology

## 2015-09-29 ENCOUNTER — Encounter: Payer: Self-pay | Admitting: Anesthesiology

## 2015-09-29 DIAGNOSIS — Z6839 Body mass index (BMI) 39.0-39.9, adult: Secondary | ICD-10-CM | POA: Insufficient documentation

## 2015-09-29 DIAGNOSIS — E1142 Type 2 diabetes mellitus with diabetic polyneuropathy: Secondary | ICD-10-CM | POA: Insufficient documentation

## 2015-09-29 DIAGNOSIS — K219 Gastro-esophageal reflux disease without esophagitis: Secondary | ICD-10-CM | POA: Insufficient documentation

## 2015-09-29 DIAGNOSIS — E119 Type 2 diabetes mellitus without complications: Secondary | ICD-10-CM | POA: Insufficient documentation

## 2015-09-29 DIAGNOSIS — F329 Major depressive disorder, single episode, unspecified: Secondary | ICD-10-CM | POA: Diagnosis not present

## 2015-09-29 DIAGNOSIS — E7800 Pure hypercholesterolemia, unspecified: Secondary | ICD-10-CM | POA: Insufficient documentation

## 2015-09-29 DIAGNOSIS — Z888 Allergy status to other drugs, medicaments and biological substances status: Secondary | ICD-10-CM | POA: Insufficient documentation

## 2015-09-29 DIAGNOSIS — F609 Personality disorder, unspecified: Secondary | ICD-10-CM | POA: Insufficient documentation

## 2015-09-29 DIAGNOSIS — E669 Obesity, unspecified: Secondary | ICD-10-CM | POA: Diagnosis not present

## 2015-09-29 DIAGNOSIS — F332 Major depressive disorder, recurrent severe without psychotic features: Secondary | ICD-10-CM | POA: Diagnosis not present

## 2015-09-29 DIAGNOSIS — I10 Essential (primary) hypertension: Secondary | ICD-10-CM | POA: Insufficient documentation

## 2015-09-29 LAB — GLUCOSE, CAPILLARY: Glucose-Capillary: 76 mg/dL (ref 65–99)

## 2015-09-29 LAB — POCT PREGNANCY, URINE: Preg Test, Ur: NEGATIVE

## 2015-09-29 MED ORDER — SODIUM CHLORIDE 0.9 % IV SOLN
250.0000 mL | Freq: Once | INTRAVENOUS | Status: AC
  Administered 2015-09-29: 250 mL via INTRAVENOUS

## 2015-09-29 MED ORDER — LIDOCAINE HCL (CARDIAC) 20 MG/ML IV SOLN
4.0000 mg | Freq: Once | INTRAVENOUS | Status: AC
  Administered 2015-09-29: 4 mg via INTRAVENOUS

## 2015-09-29 MED ORDER — SUCCINYLCHOLINE CHLORIDE 20 MG/ML IJ SOLN
100.0000 mg | Freq: Once | INTRAMUSCULAR | Status: AC
Start: 2015-09-29 — End: 2015-09-29
  Administered 2015-09-29: 100 mg via INTRAVENOUS

## 2015-09-29 MED ORDER — KETOROLAC TROMETHAMINE 30 MG/ML IJ SOLN
30.0000 mg | Freq: Once | INTRAMUSCULAR | Status: AC
  Administered 2015-09-29: 30 mg via INTRAVENOUS

## 2015-09-29 MED ORDER — GLYCOPYRROLATE 0.2 MG/ML IJ SOLN
0.4000 mg | Freq: Once | INTRAMUSCULAR | Status: AC
  Administered 2015-09-29: 0.4 mg via INTRAVENOUS

## 2015-09-29 MED ORDER — METHOHEXITAL SODIUM 100 MG/10ML IV SOSY
70.0000 mg | PREFILLED_SYRINGE | Freq: Once | INTRAVENOUS | Status: AC
  Administered 2015-09-29: 70 mg via INTRAVENOUS

## 2015-09-29 MED ORDER — FENTANYL CITRATE (PF) 100 MCG/2ML IJ SOLN: 25.0000 ug | INTRAMUSCULAR | Status: DC | PRN

## 2015-09-29 MED ORDER — ONDANSETRON HCL 4 MG/2ML IJ SOLN: 4.0000 mg | Freq: Once | INTRAMUSCULAR | Status: DC | PRN

## 2015-09-29 MED ORDER — ESMOLOL HCL-SODIUM CHLORIDE 2000 MG/100ML IV SOLN
10.0000 mg | Freq: Once | INTRAVENOUS | Status: AC
  Administered 2015-09-29: 10 mg via INTRAVENOUS

## 2015-09-29 MED ORDER — LABETALOL HCL 5 MG/ML IV SOLN
20.0000 mg | Freq: Once | INTRAVENOUS | Status: AC
  Administered 2015-09-29: 20 mg via INTRAVENOUS

## 2015-09-29 MED ORDER — SODIUM CHLORIDE 0.9 % IV SOLN
INTRAVENOUS | Status: DC | PRN
  Administered 2015-09-29: 11:00:00 via INTRAVENOUS

## 2015-09-29 NOTE — Procedures (Signed)
ECT SERVICES Physician's Interval Evaluation & Treatment Note  Patient Identification: Melinda [name] MRN:  [id number] Date of Evaluation:  09/29/2015 TX #: [id number]  MADRS:   MMSE:   P.E. Findings:  No change to physical exam no remarkable findings  Psychiatric Interval Note:  Mood is a little bit worse than last week but not depressed no suicidal ideas or behavior  Subjective:  Patient is a 44 y.o. female seen for evaluation for Electroconvulsive Therapy. No specific new complaints  Treatment Summary:   []   Right Unilateral             [x]  Bilateral   % Energy : 1.0 ms 35%   Impedance: 1210 ohms   Seizure Energy Index: 3658 V squared ostictal Suppression In88% eizure Concordance In97%  Medications  PRobinul 0.4 mg, Xylocaine 4 mg, labetalol 20 mg, Toradol 30 mg, Brevital 70 mg, esmolol 10 mg, succinylcholine 100 mg   Post Shock: None Seizure Durati37 seconds by EMG, 56 seconds by EEG   Comments: Follow-up in 2 weeks   Lungs:  [x]   Clear to auscultation               []  Other:   Heart:    [x]   Regular rhythm             []  irregular rhythm    []   Previous H&P reviewed, patient examined and there are NO CHANGES                 []   Previous H&P reviewed, patient examined and there are changes noted.   [name], MD 11/18/201610:44 AM

## 2015-09-29 NOTE — Anesthesia Preprocedure Evaluation (Signed)
Anesthesia Evaluation  Patient identified by MRN, date of birth, ID band Patient awake    Reviewed: Allergy & Precautions, NPO status , Patient's Chart, lab work & pertinent test results  Airway Mallampati: III  TM Distance: >3 FB Neck ROM: Full    Dental  (+) Teeth Intact   Pulmonary sleep apnea and Continuous Positive Airway Pressure Ventilation ,    Pulmonary exam normal        Cardiovascular Exercise Tolerance: Good hypertension, Pt. on medications Normal cardiovascular exam  Cardiomyopathy HX   Neuro/Psych PSYCHIATRIC DISORDERS Depression Bipolar Disorder Peripheral neuropathy  Neuromuscular disease    GI/Hepatic Neg liver ROS, GERD  Medicated and Controlled,Denies GERD now--no longer on meds for it.   Endo/Other  diabetes, Well Controlled, Type 2  Renal/GU negative Renal ROS  negative genitourinary   Musculoskeletal negative musculoskeletal ROS (+)   Abdominal (+) + obese,  Abdomen: soft.    Peds negative pediatric ROS (+)  Hematology negative hematology ROS (+)   Anesthesia Other Findings obesity  Reproductive/Obstetrics                             Anesthesia Physical Anesthesia Plan  ASA: III  Anesthesia Plan: General   Post-op Pain Management:    Induction: Intravenous  Airway Management Planned: Mask  Additional Equipment:   Intra-op Plan:   Post-operative Plan:   Informed Consent:   Dental advisory given  Plan Discussed with: CRNA and Surgeon  Anesthesia Plan Comments:         Anesthesia Quick Evaluation

## 2015-09-29 NOTE — Transfer of Care (Signed)
Immediate Anesthesia Transfer of Care Note  Patient: Melinda [name]  Procedure(s) Performed: ect  Patient Location: PACU  Anesthesia Type:General  Level of Consciousness: awake  Airway & Oxygen Therapy: Patient Spontanous Breathing and Patient connected to face mask oxygen  Post-op Assessment: Report given to RN  Post vital signs: Reviewed  Last Vitals:  Filed Vitals:   09/29/15 1051  BP: 144/116  Pulse: 96  Temp: 38.2 C  Resp: 15    Complications: No apparent anesthesia complications

## 2015-09-29 NOTE — H&P (Signed)
Melinda [name] is an 44 y.o. female.   Chief Complaint: No new complaint HPI: Maintenance ECT. Stable. No change since last treatment  Past Medical History  Diagnosis Date  . Depression   . Diabetes mellitus without complication ([place])   . GERD (gastroesophageal reflux disease)   . Hypercholesterolemia 03/07/15  . Hypertension   . Obesity 03/07/15  . Personality disorder 03/07/15  . Sinus tachycardia ([place]) 03/07/15    history of  . Suicidal thoughts 03/07/15  . Diabetic peripheral neuropathy ([place]) 03/07/15  . Diabetic peripheral neuropathy ([place]) 03/07/15  . Diabetic peripheral neuropathy ([place]) 03/07/15    Past Surgical History  Procedure Laterality Date  . Electroconvulsion therapy  03/07/15    Family History  Problem Relation Age of Onset  . Hypertension Father   . Diabetes Mother    Social History:  reports that she has never smoked. She has never used smokeless tobacco. She reports that she does not drink alcohol or use illicit drugs.  Allergies:  Allergies  Allergen Reactions  . Prednisone     Increases blood sugar     (Not in a hospital admission)  Results for orders placed or performed during the hospital encounter of 09/29/15 (from the past 48 hour(s))  Glucose, capillary     Status: None   Collection Time: 09/29/15  8:48 AM  Result Value Ref Range   Glucose-Capillary 76 65 - 99 mg/dL   Comment 1 Notify RN   Pregnancy, urine POC     Status: None   Collection Time: 09/29/15  8:50 AM  Result Value Ref Range   Preg Test, Ur NEGATIVE NEGATIVE    Comment:        THE SENSITIVITY OF THIS METHODOLOGY IS >24 mIU/mL    No results found.  Review of Systems  Constitutional: Negative.   HENT: Negative.   Eyes: Negative.   Respiratory: Negative.   Cardiovascular: Negative.   Gastrointestinal: Negative.   Musculoskeletal: Negative.   Skin: Negative.   Neurological: Negative.   Psychiatric/Behavioral: Negative for depression, suicidal ideas, hallucinations, memory  loss and substance abuse. The patient is not nervous/anxious and does not have insomnia.     Blood pressure 123/80, pulse 78, temperature 97.9 F (36.6 C), temperature source Oral, resp. rate 18, height 5\' 4"  (1.626 m), weight 105.688 kg (233 lb), last menstrual period 09/25/2015, SpO2 100 %. Physical Exam  Nursing note and vitals reviewed. Constitutional: She appears well-developed and well-nourished.  HENT:  Head: Normocephalic and atraumatic.  Eyes: Conjunctivae are normal. Pupils are equal, round, and reactive to light.  Neck: Normal range of motion.  Cardiovascular: Normal rate, regular rhythm and normal heart sounds.   Respiratory: Effort normal and breath sounds normal. No respiratory distress.  GI: Soft.  Musculoskeletal: Normal range of motion.  Neurological: She is alert.  Skin: Skin is warm and dry.  Psychiatric: She has a normal mood and affect. Her behavior is normal. Judgment and thought content normal.     Assessment/Plan Continues to tolerate treatment well with good response. Because of the holiday we will be following up in 2 weeks  [place] 09/29/2015, 10:42 AM

## 2015-10-02 NOTE — Anesthesia Postprocedure Evaluation (Signed)
Anesthesia Post Note  Patient: Melinda [name]  Procedure(s) Performed: * No procedures listed *  Patient location during evaluation: PACU Anesthesia Type: General Level of consciousness: awake, awake and alert and oriented Pain management: pain level controlled Vital Signs Assessment: post-procedure vital signs reviewed and stable Respiratory status: spontaneous breathing Cardiovascular status: blood pressure returned to baseline Anesthetic complications: no    Last Vitals:  Filed Vitals:   09/29/15 1120 09/29/15 1134  BP: 123/74 107/73  Pulse: 91 85  Temp: 37.4 C   Resp: 28 18    Last Pain: There were no vitals filed for this visit.               Jaasiel [place]

## 2015-10-13 ENCOUNTER — Encounter: Payer: Self-pay | Admitting: Anesthesiology

## 2015-10-13 ENCOUNTER — Encounter
Admission: RE | Admit: 2015-10-13 | Discharge: 2015-10-13 | Disposition: A | Discharge status: Home / Self Care | Payer: Medicare PPO | Source: Ambulatory Visit | Attending: Psychiatry | Admitting: Psychiatry

## 2015-10-13 DIAGNOSIS — K219 Gastro-esophageal reflux disease without esophagitis: Secondary | ICD-10-CM | POA: Insufficient documentation

## 2015-10-13 DIAGNOSIS — E7800 Pure hypercholesterolemia, unspecified: Secondary | ICD-10-CM | POA: Insufficient documentation

## 2015-10-13 DIAGNOSIS — Z888 Allergy status to other drugs, medicaments and biological substances status: Secondary | ICD-10-CM | POA: Diagnosis not present

## 2015-10-13 DIAGNOSIS — I10 Essential (primary) hypertension: Secondary | ICD-10-CM | POA: Insufficient documentation

## 2015-10-13 DIAGNOSIS — F322 Major depressive disorder, single episode, severe without psychotic features: Secondary | ICD-10-CM | POA: Insufficient documentation

## 2015-10-13 DIAGNOSIS — F332 Major depressive disorder, recurrent severe without psychotic features: Secondary | ICD-10-CM | POA: Diagnosis not present

## 2015-10-13 DIAGNOSIS — E669 Obesity, unspecified: Secondary | ICD-10-CM | POA: Insufficient documentation

## 2015-10-13 DIAGNOSIS — E1142 Type 2 diabetes mellitus with diabetic polyneuropathy: Secondary | ICD-10-CM | POA: Insufficient documentation

## 2015-10-13 LAB — POCT PREGNANCY, URINE: Preg Test, Ur: NEGATIVE

## 2015-10-13 LAB — GLUCOSE, CAPILLARY: Glucose-Capillary: 92 mg/dL (ref 65–99)

## 2015-10-13 MED ORDER — LIDOCAINE HCL (CARDIAC) 20 MG/ML IV SOLN
4.0000 mg | Freq: Once | INTRAVENOUS | Status: AC
  Administered 2015-10-13: 4 mg via INTRAVENOUS

## 2015-10-13 MED ORDER — ESMOLOL HCL-SODIUM CHLORIDE 2000 MG/100ML IV SOLN
10.0000 mg | Freq: Once | INTRAVENOUS | Status: AC
  Administered 2015-10-13: 10000 ug via INTRAVENOUS

## 2015-10-13 MED ORDER — METHOHEXITAL SODIUM 100 MG/10ML IV SOSY
70.0000 mg | PREFILLED_SYRINGE | Freq: Once | INTRAVENOUS | Status: AC
  Administered 2015-10-13: 70 mg via INTRAVENOUS

## 2015-10-13 MED ORDER — DEXTROSE 5 % IV SOLN
INTRAVENOUS | Status: DC
Start: 2015-10-13 — End: 2015-10-13

## 2015-10-13 MED ORDER — SODIUM CHLORIDE 0.9 % IV SOLN
250.0000 mL | Freq: Once | INTRAVENOUS | Status: AC
  Administered 2015-10-13: 250 mL via INTRAVENOUS

## 2015-10-13 MED ORDER — SUCCINYLCHOLINE CHLORIDE 20 MG/ML IJ SOLN
100.0000 mg | Freq: Once | INTRAMUSCULAR | Status: AC
Start: 2015-10-13 — End: 2015-10-13
  Administered 2015-10-13: 100 mg via INTRAVENOUS

## 2015-10-13 MED ORDER — GLYCOPYRROLATE 0.2 MG/ML IJ SOLN
0.4000 mg | Freq: Once | INTRAMUSCULAR | Status: AC
  Administered 2015-10-13: 0.4 mg via INTRAVENOUS

## 2015-10-13 MED ORDER — KETOROLAC TROMETHAMINE 30 MG/ML IJ SOLN
30.0000 mg | Freq: Once | INTRAMUSCULAR | Status: AC
  Administered 2015-10-13: 30 mg via INTRAVENOUS

## 2015-10-13 MED ORDER — SODIUM CHLORIDE 0.9 % IV SOLN: 250.0000 mL | Freq: Once | INTRAVENOUS | Status: DC

## 2015-10-13 MED ORDER — LABETALOL HCL 5 MG/ML IV SOLN
20.0000 mg | Freq: Once | INTRAVENOUS | Status: AC
  Administered 2015-10-13: 20 mg via INTRAVENOUS

## 2015-10-13 NOTE — Procedures (Signed)
ECT SERVICES Physician's Interval Evaluation & Treatment Note  Patient Identification: Melinda [name] MRN:  [id number] Date of Evaluation:  10/13/2015 TX #: [id number]  MADRS: 20  MMSE: 29  P.E. Findings:  Patient has been biting on her finger and picking at her skin a little bit more otherwise no change to basic physical exam  Psychiatric Interval Note:  Mood is feeling a little bit more down and anxious  Subjective:  Patient is a 44 y.o. female seen for evaluation for Electroconvulsive Therapy. Notes that she is aware of feeling more anxious and down  Treatment Summary:   []   Right Unilateral             [x]  Bilateral   % Energy : 1.0 ms, 35%   Impedance: 1540 ohms  Seizure Energy Index: 8553 V squared  Postictal Suppression Index: 91%  Seizure Concordance Index: 98%  Medications  Pre Shock: Robinul 0.4 mg, Xylocaine 4 mg, Toradol 30 mg, labetalol 20 mg, Brevital 70 mg, esmolol 10 mg, succinylcholine 100 mg  Post Shock: None  Seizure Duration: 36 seconds by EMG, 68 seconds by EEG   Comments: Patient will follow-up in one week on December 9   Lungs:  [x]   Clear to auscultation               []  Other:   Heart:    [x]   Regular rhythm             []  irregular rhythm    [x]   Previous H&P reviewed, patient examined and there are NO CHANGES                 []   Previous H&P reviewed, patient examined and there are changes noted.   Melinda Berthold, MD 12/2/201610:16 AM

## 2015-10-13 NOTE — Addendum Note (Signed)
Addendum  created 10/13/15 1100 by Gunnar Fusi, MD   Modules edited: Orders, PRL Based Order Sets

## 2015-10-13 NOTE — Anesthesia Postprocedure Evaluation (Signed)
Anesthesia Post Note  Patient: Melinda [name]  Procedure(s) Performed: * No procedures listed *  Patient location during evaluation: PACU Anesthesia Type: General Level of consciousness: awake and alert Pain management: pain level controlled Vital Signs Assessment: post-procedure vital signs reviewed and stable Respiratory status: spontaneous breathing and respiratory function stable Cardiovascular status: blood pressure returned to baseline and stable Anesthetic complications: no    Last Vitals:  Filed Vitals:   10/13/15 1034 10/13/15 1044  BP: 114/65 114/75  Pulse: 95 93  Temp: 37.7 C   Resp: 20 20    Last Pain:  Filed Vitals:   10/13/15 1047  PainSc: Asleep                 Candas Deemer K

## 2015-10-13 NOTE — Anesthesia Preprocedure Evaluation (Signed)
Anesthesia Evaluation  Patient identified by MRN, date of birth, ID band Patient awake    Reviewed: Allergy & Precautions, NPO status , Patient's Chart, lab work & pertinent test results  Airway Mallampati: III  TM Distance: >3 FB Neck ROM: Full    Dental  (+) Teeth Intact   Pulmonary sleep apnea and Continuous Positive Airway Pressure Ventilation ,    Pulmonary exam normal        Cardiovascular Exercise Tolerance: Good hypertension, Pt. on medications Normal cardiovascular exam  Cardiomyopathy HX   Neuro/Psych PSYCHIATRIC DISORDERS Depression Bipolar Disorder Peripheral neuropathy  Neuromuscular disease    GI/Hepatic Neg liver ROS, GERD  Medicated and Controlled,Denies GERD now--no longer on meds for it.   Endo/Other  diabetes, Well Controlled, Type 2  Renal/GU negative Renal ROS  negative genitourinary   Musculoskeletal negative musculoskeletal ROS (+)   Abdominal (+) + obese,  Abdomen: soft.    Peds negative pediatric ROS (+)  Hematology negative hematology ROS (+)   Anesthesia Other Findings obesity  Reproductive/Obstetrics                             Anesthesia Physical  Anesthesia Plan  ASA: III  Anesthesia Plan: General   Post-op Pain Management:    Induction: Intravenous  Airway Management Planned: Mask  Additional Equipment:   Intra-op Plan:   Post-operative Plan:   Informed Consent: I have reviewed the patients History and Physical, chart, labs and discussed the procedure including the risks, benefits and alternatives for the proposed anesthesia with the patient or authorized representative who has indicated his/her understanding and acceptance.   Dental advisory given  Plan Discussed with: CRNA and Surgeon  Anesthesia Plan Comments:         Anesthesia Quick Evaluation

## 2015-10-13 NOTE — Transfer of Care (Signed)
Immediate Anesthesia Transfer of Care Note  Patient: Melinda [name]  Procedure(s) Performed: ECT  Patient Location: PACU  Anesthesia Type:General  Level of Consciousness: sedated  Airway & Oxygen Therapy: Patient Spontanous Breathing and Patient connected to face mask oxygen  Post-op Assessment: Report given to RN  Post vital signs: Reviewed and stable  Last Vitals:  Filed Vitals:   10/13/15 0810 10/13/15 1033  BP: 102/65 114/65  Pulse: 80 94  Temp:  99.71F  Resp: 18 15    Complications: No apparent anesthesia complications

## 2015-10-13 NOTE — Anesthesia Procedure Notes (Signed)
Date/Time: 10/13/2015 10:22 AM Performed by: Dionne Bucy Pre-anesthesia Checklist: Patient identified, Emergency Drugs available, Suction available and Patient being monitored Patient Re-evaluated:Patient Re-evaluated prior to inductionOxygen Delivery Method: Circle system utilized Preoxygenation: Pre-oxygenation with 100% oxygen Intubation Type: IV induction Ventilation: Mask ventilation without difficulty and Mask ventilation throughout procedure Airway Equipment and Method: Bite block Placement Confirmation: positive ETCO2 Dental Injury: Teeth and Oropharynx as per pre-operative assessment

## 2015-10-13 NOTE — H&P (Signed)
Melinda [name] is an 44 y.o. female.   Chief Complaint: Patient feels a little bit more down. She has been doing more skin picking. No physical complaint. Depression little bit worse HPI: Patient receiving ECT as maintenance for chronic severe major depression  Past Medical History  Diagnosis Date  . Depression   . Diabetes mellitus without complication ([place])   . GERD (gastroesophageal reflux disease)   . Hypercholesterolemia 03/07/15  . Hypertension   . Obesity 03/07/15  . Personality disorder 03/07/15  . Sinus tachycardia ([place]) 03/07/15    history of  . Suicidal thoughts 03/07/15  . Diabetic peripheral neuropathy ([place]) 03/07/15  . Diabetic peripheral neuropathy ([place]) 03/07/15  . Diabetic peripheral neuropathy ([place]) 03/07/15    Past Surgical History  Procedure Laterality Date  . Electroconvulsion therapy  03/07/15    Family History  Problem Relation Age of Onset  . Hypertension Father   . Diabetes Mother    Social History:  reports that she has never smoked. She has never used smokeless tobacco. She reports that she does not drink alcohol or use illicit drugs.  Allergies:  Allergies  Allergen Reactions  . Prednisone     Increases blood sugar     (Not in a hospital admission)  Results for orders placed or performed during the hospital encounter of 10/13/15 (from the past 48 hour(s))  Pregnancy, urine POC     Status: None   Collection Time: 10/13/15  8:03 AM  Result Value Ref Range   Preg Test, Ur NEGATIVE NEGATIVE    Comment:        THE SENSITIVITY OF THIS METHODOLOGY IS >24 mIU/mL   Glucose, capillary     Status: None   Collection Time: 10/13/15  8:07 AM  Result Value Ref Range   Glucose-Capillary 92 65 - 99 mg/dL   No results found.  Review of Systems  Constitutional: Negative.   HENT: Negative.   Eyes: Negative.   Respiratory: Negative.   Cardiovascular: Negative.   Gastrointestinal: Negative.   Musculoskeletal: Negative.   Skin: Negative.    Neurological: Negative.   Psychiatric/Behavioral: Positive for depression. Negative for suicidal ideas, hallucinations, memory loss and substance abuse. The patient is nervous/anxious and has insomnia.     Blood pressure 102/65, pulse 80, temperature 98.3 F (36.8 C), temperature source Oral, resp. rate 18, height 5\' 4"  (1.626 m), weight 104.781 kg (231 lb), last menstrual period 09/25/2015, SpO2 100 %. Physical Exam  Nursing note and vitals reviewed. Constitutional: She appears well-developed and well-nourished.  HENT:  Head: Normocephalic and atraumatic.  Eyes: Conjunctivae are normal. Pupils are equal, round, and reactive to light.  Neck: Normal range of motion.  Cardiovascular: Normal rate, regular rhythm and normal heart sounds.   Respiratory: Effort normal and breath sounds normal. No respiratory distress.  GI: Soft.  Musculoskeletal: Normal range of motion.  Neurological: She is alert.  Skin: Skin is warm and dry.  Psychiatric: Judgment and thought content normal. She is slowed. She exhibits a depressed mood.     Assessment/Plan Treatment today on her usual schedule follow-up one week. Reviewed medications and treatment plan with patient  [name] 10/13/2015, 10:14 AM

## 2015-10-20 ENCOUNTER — Encounter
Admission: RE | Admit: 2015-10-20 | Discharge: 2015-10-20 | Disposition: A | Discharge status: Home / Self Care | Payer: Medicare PPO | Source: Ambulatory Visit | Attending: Psychiatry | Admitting: Psychiatry

## 2015-10-20 ENCOUNTER — Encounter: Payer: Self-pay | Admitting: Anesthesiology

## 2015-10-20 DIAGNOSIS — Z888 Allergy status to other drugs, medicaments and biological substances status: Secondary | ICD-10-CM | POA: Insufficient documentation

## 2015-10-20 DIAGNOSIS — E669 Obesity, unspecified: Secondary | ICD-10-CM | POA: Diagnosis not present

## 2015-10-20 DIAGNOSIS — E1142 Type 2 diabetes mellitus with diabetic polyneuropathy: Secondary | ICD-10-CM | POA: Insufficient documentation

## 2015-10-20 DIAGNOSIS — F329 Major depressive disorder, single episode, unspecified: Secondary | ICD-10-CM | POA: Insufficient documentation

## 2015-10-20 DIAGNOSIS — F332 Major depressive disorder, recurrent severe without psychotic features: Secondary | ICD-10-CM | POA: Diagnosis not present

## 2015-10-20 DIAGNOSIS — E7800 Pure hypercholesterolemia, unspecified: Secondary | ICD-10-CM | POA: Insufficient documentation

## 2015-10-20 DIAGNOSIS — K219 Gastro-esophageal reflux disease without esophagitis: Secondary | ICD-10-CM | POA: Diagnosis not present

## 2015-10-20 DIAGNOSIS — I10 Essential (primary) hypertension: Secondary | ICD-10-CM | POA: Insufficient documentation

## 2015-10-20 LAB — GLUCOSE, CAPILLARY: Glucose-Capillary: 80 mg/dL (ref 65–99)

## 2015-10-20 LAB — POCT PREGNANCY, URINE: Preg Test, Ur: NEGATIVE

## 2015-10-20 MED ORDER — KETOROLAC TROMETHAMINE 30 MG/ML IJ SOLN
30.0000 mg | Freq: Once | INTRAMUSCULAR | Status: AC
  Administered 2015-10-20: 30 mg via INTRAVENOUS

## 2015-10-20 MED ORDER — SUCCINYLCHOLINE CHLORIDE 20 MG/ML IJ SOLN
100.0000 mg | Freq: Once | INTRAMUSCULAR | Status: AC
  Administered 2015-10-20: 100 mg via INTRAVENOUS

## 2015-10-20 MED ORDER — SODIUM CHLORIDE 0.9 % IV SOLN
250.0000 mL | Freq: Once | INTRAVENOUS | Status: AC
  Administered 2015-10-20: 250 mL via INTRAVENOUS

## 2015-10-20 MED ORDER — LABETALOL HCL 5 MG/ML IV SOLN
20.0000 mg | Freq: Once | INTRAVENOUS | Status: AC
  Administered 2015-10-20: 20 mg via INTRAVENOUS

## 2015-10-20 MED ORDER — GLYCOPYRROLATE 0.2 MG/ML IJ SOLN
0.4000 mg | Freq: Once | INTRAMUSCULAR | Status: AC
  Administered 2015-10-20: 0.4 mg via INTRAVENOUS

## 2015-10-20 MED ORDER — ESMOLOL HCL-SODIUM CHLORIDE 2000 MG/100ML IV SOLN
10.0000 mg | Freq: Once | INTRAVENOUS | Status: AC
  Administered 2015-10-20: 10 mg via INTRAVENOUS

## 2015-10-20 MED ORDER — LIDOCAINE HCL (CARDIAC) 20 MG/ML IV SOLN
4.0000 mg | Freq: Once | INTRAVENOUS | Status: AC
  Administered 2015-10-20: 4 mg via INTRAVENOUS

## 2015-10-20 MED ORDER — METHOHEXITAL SODIUM 100 MG/10ML IV SOSY
70.0000 mg | PREFILLED_SYRINGE | Freq: Once | INTRAVENOUS | Status: AC
  Administered 2015-10-20: 70 mg via INTRAVENOUS

## 2015-10-20 NOTE — Procedures (Signed)
ECT SERVICES Physician's Interval Evaluation & Treatment Note  Patient Identification: Melinda [name] MRN:  [id number] Date of Evaluation:  10/20/2015 TX #: [id number]  MADRS:   MMSE:   P.E. Findings:  No new physical complaints no new findings on physical exam vital signs stable  Psychiatric Interval Note:  Mood is stated as being fairly good no suicidal ideation no current self-mutilation  Subjective:  Patient is a 44 y.o. female seen for evaluation for Electroconvulsive Therapy. No specific new complaints  Treatment Summary:   []   Right Unilateral             [x]  Bilateral   % Energy : 1.0 ms, 35%   Impedance: 1450 ohms  Seizure Energy Index: 6736 V squared  Postictal Suppression Index: 92%  Seizure Concordance Index: 96%  Medications  Pre Shock: Robinul 0.4 mg, Xylocaine 4 mg, labetalol 20 mg, Toradol 30 mg, Brevital 70 mg, esmolol 10 mg, succinylcholine 100 mg  Post Shock: None  Seizure Duration: 35 seconds by EMG, 50 seconds by EEG   Comments: Follow-up one week December 16   Lungs:  [x]   Clear to auscultation               []  Other:   Heart:    [x]   Regular rhythm             []  irregular rhythm    [x]   Previous H&P reviewed, patient examined and there are NO CHANGES                 []   Previous H&P reviewed, patient examined and there are changes noted.   [name], MD 12/9/201610:12 AM

## 2015-10-20 NOTE — H&P (Signed)
Malayah Jerilynn Mages [name] is an 44 y.o. female.   Chief Complaint: Patient receiving maintenance ECT treatment on a weekly basis tolerated well no new complaint reports that her mood feels better this week HPI: No interval change no medical problems newly introduced. Blood sugars are good. Vital signs stable.  Past Medical History  Diagnosis Date  . Depression   . Diabetes mellitus without complication ([place])   . GERD (gastroesophageal reflux disease)   . Hypercholesterolemia 03/07/15  . Hypertension   . Obesity 03/07/15  . Personality disorder 03/07/15  . Sinus tachycardia ([place]) 03/07/15    history of  . Suicidal thoughts 03/07/15  . Diabetic peripheral neuropathy ([place]) 03/07/15  . Diabetic peripheral neuropathy ([place]) 03/07/15  . Diabetic peripheral neuropathy ([place]) 03/07/15    Past Surgical History  Procedure Laterality Date  . Electroconvulsion therapy  03/07/15    Family History  Problem Relation Age of Onset  . Hypertension Father   . Diabetes Mother    Social History:  reports that she has never smoked. She has never used smokeless tobacco. She reports that she does not drink alcohol or use illicit drugs.  Allergies:  Allergies  Allergen Reactions  . Prednisone     Increases blood sugar     (Not in a hospital admission)  Results for orders placed or performed during the hospital encounter of 10/20/15 (from the past 48 hour(s))  Pregnancy, urine POC     Status: None   Collection Time: 10/20/15  8:10 AM  Result Value Ref Range   Preg Test, Ur NEGATIVE NEGATIVE    Comment:        THE SENSITIVITY OF THIS METHODOLOGY IS >24 mIU/mL   Glucose, capillary     Status: None   Collection Time: 10/20/15  8:14 AM  Result Value Ref Range   Glucose-Capillary 80 65 - 99 mg/dL   Comment 1 Notify RN    No results found.  Review of Systems  Constitutional: Negative.   HENT: Negative.   Eyes: Negative.   Respiratory: Negative.   Cardiovascular: Negative.   Gastrointestinal: Negative.    Musculoskeletal: Negative.   Skin: Negative.   Neurological: Negative.   Psychiatric/Behavioral: Negative for depression, suicidal ideas, memory loss and substance abuse. The patient is not nervous/anxious and does not have insomnia.     Blood pressure 139/87, pulse 89, temperature 97.7 F (36.5 C), temperature source Oral, resp. rate 18, height 5\' 4"  (1.626 m), weight 104.327 kg (230 lb), last menstrual period 09/25/2015, SpO2 100 %. Physical Exam  Nursing note and vitals reviewed. Constitutional: She appears well-developed and well-nourished.  HENT:  Head: Normocephalic and atraumatic.  Eyes: Conjunctivae are normal. Pupils are equal, round, and reactive to light.  Neck: Normal range of motion.  Cardiovascular: Normal rate, regular rhythm and normal heart sounds.   Respiratory: Effort normal and breath sounds normal. No respiratory distress.  GI: Soft.  Musculoskeletal: Normal range of motion.  Neurological: She is alert.  Skin: Skin is warm and dry.     Assessment/Plan Follow-up weekly ECT treatment as before. Patient agrees to plan. Discussed her medication and continued outpatient treatment with encouragement that she get a little more physically active as usual.  [name] 10/20/2015, 10:10 AM

## 2015-10-20 NOTE — Transfer of Care (Addendum)
Immediate Anesthesia Transfer of Care Note  Patient: Melinda [name]  Procedure(s) Performed: ECT  Patient Location: PACU  Anesthesia Type:General  Level of Consciousness: sedated  Airway & Oxygen Therapy: Patient Spontanous Breathing and Patient connected to face mask oxygen  Post-op Assessment: Report given to RN  Post vital signs: Reviewed and stable  Last Vitals:  Filed Vitals:   10/20/15 0810 10/20/15 1030  BP: 139/87 102/82  Pulse: 89 92  Temp: 36.5 C 36.3 C  Resp: 18 18    Complications: No apparent anesthesia complications

## 2015-10-20 NOTE — Anesthesia Procedure Notes (Signed)
Date/Time: 10/20/2015 10:19 AM Performed by: Dionne Bucy Pre-anesthesia Checklist: Patient identified, Emergency Drugs available, Suction available and Patient being monitored Patient Re-evaluated:Patient Re-evaluated prior to inductionOxygen Delivery Method: Circle system utilized Preoxygenation: Pre-oxygenation with 100% oxygen Intubation Type: IV induction Ventilation: Mask ventilation without difficulty and Mask ventilation throughout procedure Airway Equipment and Method: Bite block Placement Confirmation: positive ETCO2 Dental Injury: Teeth and Oropharynx as per pre-operative assessment

## 2015-10-20 NOTE — Anesthesia Preprocedure Evaluation (Signed)
Anesthesia Evaluation  Patient identified by MRN, date of birth, ID band Patient awake    Reviewed: Allergy & Precautions, H&P , NPO status , Patient's Chart, lab work & pertinent test results, reviewed documented beta blocker date and time   Airway Mallampati: II  TM Distance: >3 FB Neck ROM: full    Dental no notable dental hx.    Pulmonary neg pulmonary ROS,    Pulmonary exam normal breath sounds clear to auscultation       Cardiovascular Exercise Tolerance: Good hypertension, negative cardio ROS   Rhythm:regular Rate:Normal     Neuro/Psych PSYCHIATRIC DISORDERS  Neuromuscular disease negative neurological ROS  negative psych ROS   GI/Hepatic negative GI ROS, Neg liver ROS, GERD  ,  Endo/Other  negative endocrine ROSdiabetes  Renal/GU negative Renal ROS  negative genitourinary   Musculoskeletal   Abdominal   Peds  Hematology negative hematology ROS (+)   Anesthesia Other Findings   Reproductive/Obstetrics negative OB ROS                             Anesthesia Physical Anesthesia Plan  ASA: III  Anesthesia Plan: General   Post-op Pain Management:    Induction:   Airway Management Planned:   Additional Equipment:   Intra-op Plan:   Post-operative Plan:   Informed Consent: I have reviewed the patients History and Physical, chart, labs and discussed the procedure including the risks, benefits and alternatives for the proposed anesthesia with the patient or authorized representative who has indicated his/her understanding and acceptance.   Dental Advisory Given  Plan Discussed with: CRNA  Anesthesia Plan Comments:         Anesthesia Quick Evaluation

## 2015-10-23 NOTE — Anesthesia Postprocedure Evaluation (Signed)
Anesthesia Post Note  Patient: Melinda [name]  Procedure(s) Performed: * No procedures listed *  Patient location during evaluation: PACU Anesthesia Type: General Level of consciousness: awake and alert Pain management: pain level controlled Vital Signs Assessment: post-procedure vital signs reviewed and stable Respiratory status: spontaneous breathing, nonlabored ventilation, respiratory function stable and patient connected to nasal cannula oxygen Cardiovascular status: blood pressure returned to baseline and stable Postop Assessment: no signs of nausea or vomiting Anesthetic complications: no    Last Vitals:  Filed Vitals:   10/20/15 1050 10/20/15 1109  BP: 126/76 118/75  Pulse:  84  Temp:    Resp:  18    Last Pain:  Filed Vitals:   10/20/15 1111  PainSc: 0-No pain                 Molli Barrows

## 2015-10-27 ENCOUNTER — Encounter: Payer: Self-pay | Admitting: Anesthesiology

## 2015-10-27 ENCOUNTER — Encounter
Admission: RE | Admit: 2015-10-27 | Discharge: 2015-10-27 | Disposition: A | Discharge status: Home / Self Care | Payer: Medicare PPO | Source: Ambulatory Visit | Attending: Psychiatry | Admitting: Psychiatry

## 2015-10-27 DIAGNOSIS — E7800 Pure hypercholesterolemia, unspecified: Secondary | ICD-10-CM | POA: Diagnosis not present

## 2015-10-27 DIAGNOSIS — R42 Dizziness and giddiness: Secondary | ICD-10-CM | POA: Insufficient documentation

## 2015-10-27 DIAGNOSIS — Z888 Allergy status to other drugs, medicaments and biological substances status: Secondary | ICD-10-CM | POA: Insufficient documentation

## 2015-10-27 DIAGNOSIS — F332 Major depressive disorder, recurrent severe without psychotic features: Secondary | ICD-10-CM

## 2015-10-27 DIAGNOSIS — E669 Obesity, unspecified: Secondary | ICD-10-CM | POA: Insufficient documentation

## 2015-10-27 DIAGNOSIS — F329 Major depressive disorder, single episode, unspecified: Secondary | ICD-10-CM | POA: Insufficient documentation

## 2015-10-27 DIAGNOSIS — E1142 Type 2 diabetes mellitus with diabetic polyneuropathy: Secondary | ICD-10-CM | POA: Diagnosis not present

## 2015-10-27 DIAGNOSIS — I10 Essential (primary) hypertension: Secondary | ICD-10-CM | POA: Diagnosis not present

## 2015-10-27 DIAGNOSIS — F39 Unspecified mood [affective] disorder: Secondary | ICD-10-CM | POA: Diagnosis not present

## 2015-10-27 DIAGNOSIS — K219 Gastro-esophageal reflux disease without esophagitis: Secondary | ICD-10-CM | POA: Insufficient documentation

## 2015-10-27 LAB — GLUCOSE, CAPILLARY: Glucose-Capillary: 89 mg/dL (ref 65–99)

## 2015-10-27 LAB — POCT PREGNANCY, URINE: Preg Test, Ur: NEGATIVE

## 2015-10-27 MED ORDER — LIDOCAINE HCL (CARDIAC) 20 MG/ML IV SOLN
4.0000 mg | Freq: Once | INTRAVENOUS | Status: AC
  Administered 2015-10-27: 4 mg via INTRAVENOUS

## 2015-10-27 MED ORDER — LABETALOL HCL 5 MG/ML IV SOLN
20.0000 mg | Freq: Once | INTRAVENOUS | Status: AC
  Administered 2015-10-27: 20 mg via INTRAVENOUS

## 2015-10-27 MED ORDER — SODIUM CHLORIDE 0.9 % IV SOLN
250.0000 mL | Freq: Once | INTRAVENOUS | Status: AC
  Administered 2015-10-27: 250 mL via INTRAVENOUS

## 2015-10-27 MED ORDER — GLYCOPYRROLATE 0.2 MG/ML IJ SOLN
0.4000 mg | Freq: Once | INTRAMUSCULAR | Status: AC
  Administered 2015-10-27: 0.4 mg via INTRAVENOUS

## 2015-10-27 MED ORDER — ONDANSETRON HCL 4 MG/2ML IJ SOLN: 4.0000 mg | Freq: Once | INTRAMUSCULAR | Status: DC | PRN

## 2015-10-27 MED ORDER — KETOROLAC TROMETHAMINE 30 MG/ML IJ SOLN
30.0000 mg | Freq: Once | INTRAMUSCULAR | Status: AC
  Administered 2015-10-27: 30 mg via INTRAVENOUS

## 2015-10-27 MED ORDER — FENTANYL CITRATE (PF) 100 MCG/2ML IJ SOLN: 25.0000 ug | INTRAMUSCULAR | Status: DC | PRN

## 2015-10-27 MED ORDER — METHOHEXITAL SODIUM 100 MG/10ML IV SOSY
70.0000 mg | PREFILLED_SYRINGE | Freq: Once | INTRAVENOUS | Status: AC
  Administered 2015-10-27: 70 mg via INTRAVENOUS

## 2015-10-27 MED ORDER — ESMOLOL HCL-SODIUM CHLORIDE 2000 MG/100ML IV SOLN
10.0000 mg | Freq: Once | INTRAVENOUS | Status: AC
  Administered 2015-10-27: 10000 ug via INTRAVENOUS

## 2015-10-27 MED ORDER — SUCCINYLCHOLINE CHLORIDE 20 MG/ML IJ SOLN
100.0000 mg | Freq: Once | INTRAMUSCULAR | Status: AC
  Administered 2015-10-27: 100 mg via INTRAVENOUS

## 2015-10-27 NOTE — Transfer of Care (Signed)
Immediate Anesthesia Transfer of Care Note  Patient: Melinda [name]  Procedure(s) Performed: ECT  Patient Location: PACU  Anesthesia Type:General  Level of Consciousness: awake and patient cooperative  Airway & Oxygen Therapy: Patient Spontanous Breathing and Patient connected to face mask oxygen  Post-op Assessment: Report given to RN  Post vital signs: Reviewed and stable  Last Vitals:  Filed Vitals:   10/27/15 0831 10/27/15 1046  BP: 99/67 123/64  Pulse: 90 96  Temp: 37 C 37.6 C  Resp: 18 12    Complications: No apparent anesthesia complications

## 2015-10-27 NOTE — Anesthesia Postprocedure Evaluation (Signed)
Anesthesia Post Note  Patient: Melinda [name]  Procedure(s) Performed: * No procedures listed *  Patient location during evaluation: PACU Anesthesia Type: General Level of consciousness: awake, awake and alert and oriented Pain management: pain level controlled Vital Signs Assessment: post-procedure vital signs reviewed and stable Respiratory status: spontaneous breathing Cardiovascular status: blood pressure returned to baseline Anesthetic complications: no    Last Vitals:  Filed Vitals:   10/27/15 1132 10/27/15 1134  BP:  121/75  Pulse: 81   Temp:    Resp: 18     Last Pain: There were no vitals filed for this visit.               Effa Yarrow

## 2015-10-27 NOTE — Anesthesia Procedure Notes (Signed)
Date/Time: 10/27/2015 10:36 AM Performed by: Dionne Bucy Pre-anesthesia Checklist: Patient identified, Emergency Drugs available, Suction available and Patient being monitored Patient Re-evaluated:Patient Re-evaluated prior to inductionOxygen Delivery Method: Circle system utilized Preoxygenation: Pre-oxygenation with 100% oxygen Intubation Type: IV induction Ventilation: Mask ventilation without difficulty and Mask ventilation throughout procedure Airway Equipment and Method: Bite block Placement Confirmation: positive ETCO2 Dental Injury: Teeth and Oropharynx as per pre-operative assessment

## 2015-10-27 NOTE — H&P (Signed)
Melinda [name] is an 44 y.o. female.   Chief Complaint: Slightly more dysphoric mood overall stable no other new complaints no new physical problems except that she's had a couple episodes of dizziness unrelated to ECT that are transient of unclear etiology HPI: Patient receives maintenance ECT treatment which is tolerated very well. New complaints of dizziness unlikely to be related in any way. Possibly related to blood sugar blood pressure both of which at times run low  Past Medical History  Diagnosis Date  . Depression   . Diabetes mellitus without complication ([place])   . GERD (gastroesophageal reflux disease)   . Hypercholesterolemia 03/07/15  . Hypertension   . Obesity 03/07/15  . Personality disorder 03/07/15  . Sinus tachycardia ([place]) 03/07/15    history of  . Suicidal thoughts 03/07/15  . Diabetic peripheral neuropathy ([place]) 03/07/15  . Diabetic peripheral neuropathy ([place]) 03/07/15  . Diabetic peripheral neuropathy ([place]) 03/07/15    Past Surgical History  Procedure Laterality Date  . Electroconvulsion therapy  03/07/15    Family History  Problem Relation Age of Onset  . Hypertension Father   . Diabetes Mother    Social History:  reports that she has never smoked. She has never used smokeless tobacco. She reports that she does not drink alcohol or use illicit drugs.  Allergies:  Allergies  Allergen Reactions  . Prednisone     Increases blood sugar     (Not in a hospital admission)  Results for orders placed or performed during the hospital encounter of 10/27/15 (from the past 48 hour(s))  Glucose, capillary     Status: None   Collection Time: 10/27/15  8:38 AM  Result Value Ref Range   Glucose-Capillary 89 65 - 99 mg/dL  Pregnancy, urine POC     Status: None   Collection Time: 10/27/15  8:41 AM  Result Value Ref Range   Preg Test, Ur NEGATIVE NEGATIVE    Comment:        THE SENSITIVITY OF THIS METHODOLOGY IS >24 mIU/mL    No results found.  Review of Systems   Constitutional: Negative.   HENT: Negative.   Eyes: Negative.   Respiratory: Negative.   Cardiovascular: Negative.   Gastrointestinal: Negative.   Musculoskeletal: Negative.   Skin: Negative.   Neurological: Negative.   Psychiatric/Behavioral: Negative for depression, suicidal ideas, hallucinations, memory loss and substance abuse. The patient is not nervous/anxious and does not have insomnia.     Blood pressure 99/67, pulse 90, temperature 98.6 F (37 C), resp. rate 18, height 5\' 4"  (1.626 m), weight 105.235 kg (232 lb), last menstrual period 09/25/2015, SpO2 100 %. Physical Exam  Nursing note and vitals reviewed. Constitutional: She appears well-developed and well-nourished.  HENT:  Head: Normocephalic and atraumatic.  Eyes: Conjunctivae are normal. Pupils are equal, round, and reactive to light.  Neck: Normal range of motion.  Cardiovascular: Normal rate, regular rhythm and normal heart sounds.   Respiratory: Effort normal and breath sounds normal. No respiratory distress.  GI: Soft.  Musculoskeletal: Normal range of motion.  Neurological: She is alert.  Skin: Skin is warm and dry.  Psychiatric: She has a normal mood and affect. Her behavior is normal. Judgment and thought content normal.     Assessment/Plan  no change to ECT treatment plan. I do note that her blood pressure this morning is on the lower side. Patient encouraged to continue following up with her outpatient physicians as her medications may need some adjustment if she is  having side effects. Next ECT treatment scheduled for 2 weeks  Brenley Priore 10/27/2015, 10:25 AM

## 2015-10-27 NOTE — Anesthesia Preprocedure Evaluation (Signed)
Anesthesia Evaluation  Patient identified by MRN, date of birth, ID band Patient awake    Reviewed: Allergy & Precautions, H&P , NPO status , Patient's Chart, lab work & pertinent test results, reviewed documented beta blocker date and time   Airway Mallampati: II  TM Distance: >3 FB Neck ROM: full    Dental no notable dental hx.    Pulmonary neg pulmonary ROS,    Pulmonary exam normal breath sounds clear to auscultation       Cardiovascular Exercise Tolerance: Good hypertension, negative cardio ROS   Rhythm:regular Rate:Normal     Neuro/Psych PSYCHIATRIC DISORDERS  Neuromuscular disease negative neurological ROS  negative psych ROS   GI/Hepatic negative GI ROS, Neg liver ROS, GERD  ,  Endo/Other  negative endocrine ROSdiabetes  Renal/GU negative Renal ROS  negative genitourinary   Musculoskeletal   Abdominal   Peds  Hematology negative hematology ROS (+)   Anesthesia Other Findings   Reproductive/Obstetrics negative OB ROS                             Anesthesia Physical Anesthesia Plan  ASA: III  Anesthesia Plan: General   Post-op Pain Management:    Induction: Intravenous  Airway Management Planned: Mask  Additional Equipment:   Intra-op Plan:   Post-operative Plan:   Informed Consent: I have reviewed the patients History and Physical, chart, labs and discussed the procedure including the risks, benefits and alternatives for the proposed anesthesia with the patient or authorized representative who has indicated his/her understanding and acceptance.   Dental advisory given  Plan Discussed with: CRNA and Surgeon  Anesthesia Plan Comments:         Anesthesia Quick Evaluation

## 2015-10-27 NOTE — Procedures (Signed)
ECT SERVICES Physician's Interval Evaluation & Treatment Note  Patient Identification: REMIAH CASARELLA MRN:  [id number] Date of Evaluation:  10/27/2015 TX #: [id number]  MADRS:   MMSE:   P.E. Findings:  No change to physical exam. We note that her blood pressure is a little on the low side at check this morning  Psychiatric Interval Note:  Mood was a slight bit down related to her son's behavior but not severely depressed  Subjective:  Patient is a 44 y.o. female seen for evaluation for Electroconvulsive Therapy. No specific new complaint psychiatrically  Treatment Summary:   []   Right Unilateral             [x]  Bilateral   % Energy : 1.0 ms 35%   Impedance: 740 ohms  Seizure Energy Index: 3299 V squared  Postictal Suppression Index: 96%  Seizure Concordance Index: 92%  Medications  Pre Shock: Robinul 0.4 mg, Xylocaine 4 mg, Toradol 30 mg, labetalol 20 mg, Brevital 70 mg, esmolol 10 mg, succinylcholine 100 mg  Post Shock: None  Seizure Duration: 44 seconds by EMG, 76 seconds by EEG   Comments: Patient continues to do well with clear benefit from maintenance treatment. Due to the holidays we are not having treatment next Friday so we will see her back in 2 weeks. Patient is aware of the plan. As noted previously I've also suggested she talk with her doctors about her medications and her blood pressure and sugar if she continues to have any dizzy spells.   Lungs:  [x]   Clear to auscultation               []  Other:   Heart:    [x]   Regular rhythm             []  irregular rhythm    [x]   Previous H&P reviewed, patient examined and there are NO CHANGES                 []   Previous H&P reviewed, patient examined and there are changes noted.   [name], MD 12/16/201610:28 AM

## 2015-11-10 ENCOUNTER — Encounter
Admission: RE | Admit: 2015-11-10 | Discharge: 2015-11-10 | Disposition: A | Discharge status: Home / Self Care | Payer: Medicare PPO | Source: Ambulatory Visit | Attending: Psychiatry | Admitting: Psychiatry

## 2015-11-10 ENCOUNTER — Encounter: Payer: Self-pay | Admitting: Anesthesiology

## 2015-11-10 DIAGNOSIS — E669 Obesity, unspecified: Secondary | ICD-10-CM | POA: Insufficient documentation

## 2015-11-10 DIAGNOSIS — E1142 Type 2 diabetes mellitus with diabetic polyneuropathy: Secondary | ICD-10-CM | POA: Insufficient documentation

## 2015-11-10 DIAGNOSIS — K219 Gastro-esophageal reflux disease without esophagitis: Secondary | ICD-10-CM | POA: Diagnosis not present

## 2015-11-10 DIAGNOSIS — F329 Major depressive disorder, single episode, unspecified: Secondary | ICD-10-CM | POA: Diagnosis not present

## 2015-11-10 DIAGNOSIS — F332 Major depressive disorder, recurrent severe without psychotic features: Secondary | ICD-10-CM | POA: Diagnosis not present

## 2015-11-10 DIAGNOSIS — E7800 Pure hypercholesterolemia, unspecified: Secondary | ICD-10-CM | POA: Insufficient documentation

## 2015-11-10 DIAGNOSIS — I10 Essential (primary) hypertension: Secondary | ICD-10-CM | POA: Insufficient documentation

## 2015-11-10 DIAGNOSIS — Z888 Allergy status to other drugs, medicaments and biological substances status: Secondary | ICD-10-CM | POA: Insufficient documentation

## 2015-11-10 LAB — POCT PREGNANCY, URINE: Preg Test, Ur: NEGATIVE

## 2015-11-10 LAB — GLUCOSE, CAPILLARY: Glucose-Capillary: 83 mg/dL (ref 65–99)

## 2015-11-10 MED ORDER — KETOROLAC TROMETHAMINE 30 MG/ML IJ SOLN
30.0000 mg | Freq: Once | INTRAMUSCULAR | Status: AC
  Administered 2015-11-10: 30 mg via INTRAVENOUS

## 2015-11-10 MED ORDER — METHOHEXITAL SODIUM 100 MG/10ML IV SOSY
70.0000 mg | PREFILLED_SYRINGE | Freq: Once | INTRAVENOUS | Status: AC
  Administered 2015-11-10: 70 mg via INTRAVENOUS

## 2015-11-10 MED ORDER — SUCCINYLCHOLINE CHLORIDE 20 MG/ML IJ SOLN
100.0000 mg | Freq: Once | INTRAMUSCULAR | Status: AC
  Administered 2015-11-10: 100 mg via INTRAVENOUS

## 2015-11-10 MED ORDER — LIDOCAINE HCL (CARDIAC) 20 MG/ML IV SOLN
4.0000 mg | Freq: Once | INTRAVENOUS | Status: AC
  Administered 2015-11-10: 4 mg via INTRAVENOUS

## 2015-11-10 MED ORDER — ESMOLOL HCL-SODIUM CHLORIDE 2000 MG/100ML IV SOLN
10.0000 mg | Freq: Once | INTRAVENOUS | Status: AC
  Administered 2015-11-10: 10 mg via INTRAVENOUS

## 2015-11-10 MED ORDER — GLYCOPYRROLATE 0.2 MG/ML IJ SOLN
0.4000 mg | Freq: Once | INTRAMUSCULAR | Status: AC
  Administered 2015-11-10: 0.4 mg via INTRAVENOUS

## 2015-11-10 MED ORDER — SODIUM CHLORIDE 0.9 % IV SOLN
250.0000 mL | Freq: Once | INTRAVENOUS | Status: AC
Start: 2015-11-10 — End: 2015-11-10
  Administered 2015-11-10: 500 mL via INTRAVENOUS

## 2015-11-10 MED ORDER — LABETALOL HCL 5 MG/ML IV SOLN
20.0000 mg | Freq: Once | INTRAVENOUS | Status: AC
  Administered 2015-11-10: 20 mg via INTRAVENOUS

## 2015-11-10 NOTE — H&P (Signed)
Melinda [name] is an 44 y.o. female.   Chief Complaint: Patient receives maintenance follow-up for depression. Mood is feeling a little bit more down but not having active suicidal thoughts and she has not been doing any self injury. Affect is slightly more blunted. No new physical complaints HPI: Long-standing depression which has been responsive to maintenance ECT on a weekly basis. No other new complaints medical problems under good control diabetes well controlled  Past Medical History  Diagnosis Date  . Depression   . Diabetes mellitus without complication ([place])   . GERD (gastroesophageal reflux disease)   . Hypercholesterolemia 03/07/15  . Hypertension   . Obesity 03/07/15  . Personality disorder 03/07/15  . Sinus tachycardia ([place]) 03/07/15    history of  . Suicidal thoughts 03/07/15  . Diabetic peripheral neuropathy ([place]) 03/07/15  . Diabetic peripheral neuropathy ([place]) 03/07/15  . Diabetic peripheral neuropathy ([place]) 03/07/15    Past Surgical History  Procedure Laterality Date  . Electroconvulsion therapy  03/07/15    Family History  Problem Relation Age of Onset  . Hypertension Father   . Diabetes Mother    Social History:  reports that she has never smoked. She has never used smokeless tobacco. She reports that she does not drink alcohol or use illicit drugs.  Allergies:  Allergies  Allergen Reactions  . Prednisone     Increases blood sugar     (Not in a hospital admission)  Results for orders placed or performed during the hospital encounter of 11/10/15 (from the past 48 hour(s))  Pregnancy, urine POC     Status: None   Collection Time: 11/10/15  8:13 AM  Result Value Ref Range   Preg Test, Ur NEGATIVE NEGATIVE    Comment:        THE SENSITIVITY OF THIS METHODOLOGY IS >24 mIU/mL   Glucose, capillary     Status: None   Collection Time: 11/10/15  8:19 AM  Result Value Ref Range   Glucose-Capillary 83 65 - 99 mg/dL   Comment 1 Notify RN    No results  found.  Review of Systems  Constitutional: Negative.   HENT: Negative.   Eyes: Negative.   Respiratory: Negative.   Cardiovascular: Negative.   Gastrointestinal: Negative.   Musculoskeletal: Negative.   Skin: Negative.   Neurological: Negative.   Psychiatric/Behavioral: Positive for depression. Negative for suicidal ideas, hallucinations, memory loss and substance abuse. The patient is nervous/anxious. The patient does not have insomnia.     Blood pressure 139/61, pulse 84, temperature 96.9 F (36.1 C), temperature source Oral, resp. rate 18, height 5\' 4"  (1.626 m), weight 104.781 kg (231 lb), last menstrual period 10/22/2015, SpO2 100 %. Physical Exam  Nursing note and vitals reviewed. Constitutional: She appears well-developed and well-nourished.  HENT:  Head: Normocephalic and atraumatic.  Eyes: Conjunctivae are normal. Pupils are equal, round, and reactive to light.  Neck: Normal range of motion.  Cardiovascular: Normal rate, regular rhythm and normal heart sounds.   Respiratory: Effort normal and breath sounds normal. No respiratory distress.  GI: Soft.  Musculoskeletal: Normal range of motion.  Neurological: She is alert.  Skin: Skin is warm and dry.  Psychiatric: Judgment and thought content normal. Her affect is blunt. Her speech is delayed. She is slowed. Cognition and memory are normal.     Assessment/Plan Patient will receive ECT today and next treatment will be scheduled for Friday to 6 no change to treatment plan  Jayston Trevino 11/10/2015, 10:10 AM

## 2015-11-10 NOTE — Anesthesia Procedure Notes (Signed)
Date/Time: 11/10/2015 10:16 AM Performed by: Nelda Marseille Pre-anesthesia Checklist: Patient identified, Emergency Drugs available, Suction available, Patient being monitored and Timeout performed Patient Re-evaluated:Patient Re-evaluated prior to inductionOxygen Delivery Method: Ambu bag Preoxygenation: Pre-oxygenation with 100% oxygen

## 2015-11-10 NOTE — Anesthesia Preprocedure Evaluation (Signed)
Anesthesia Evaluation  Patient identified by MRN, date of birth, ID band Patient awake    Reviewed: Allergy & Precautions, H&P , NPO status , Patient's Chart, lab work & pertinent test results, reviewed documented beta blocker date and time   Airway Mallampati: II  TM Distance: >3 FB Neck ROM: full    Dental no notable dental hx.    Pulmonary neg pulmonary ROS,    Pulmonary exam normal breath sounds clear to auscultation       Cardiovascular Exercise Tolerance: Good hypertension, negative cardio ROS   Rhythm:regular Rate:Normal     Neuro/Psych  Neuromuscular disease negative neurological ROS  negative psych ROS   GI/Hepatic negative GI ROS, Neg liver ROS, GERD  ,  Endo/Other  negative endocrine ROSdiabetes  Renal/GU negative Renal ROS  negative genitourinary   Musculoskeletal   Abdominal   Peds  Hematology negative hematology ROS (+)   Anesthesia Other Findings   Reproductive/Obstetrics negative OB ROS                             Anesthesia Physical Anesthesia Plan  ASA: III  Anesthesia Plan: General   Post-op Pain Management:    Induction:   Airway Management Planned:   Additional Equipment:   Intra-op Plan:   Post-operative Plan:   Informed Consent: I have reviewed the patients History and Physical, chart, labs and discussed the procedure including the risks, benefits and alternatives for the proposed anesthesia with the patient or authorized representative who has indicated his/her understanding and acceptance.   Dental Advisory Given  Plan Discussed with: CRNA  Anesthesia Plan Comments:         Anesthesia Quick Evaluation

## 2015-11-10 NOTE — Procedures (Signed)
ECT SERVICES Physician's Interval Evaluation & Treatment Note  Patient Identification: Melinda [name] MRN:  [id number] Date of Evaluation:  11/10/2015 TX #: [id number]  MADRS: 15  MMSE: 29  P.E. Findings:  No change to physical exam vital signs stable blood sugar stable. Lungs and heart clear  Psychiatric Interval Note:  Mood is a little bit more down no suicidal thoughts no self injury no psychosis  Subjective:  Patient is a 44 y.o. female seen for evaluation for Electroconvulsive Therapy. Slightly more down and anxious  Treatment Summary:   []   Right Unilateral             [x]  Bilateral   % Energy : 1.0 ms 35%   Impedance: 1070 ohms  Seizure Energy Index: 5094 V squared  Postictal Suppression Index: 36%  Seizure Concordance Index: 94%  Medications  Pre Shock: Robinul 0.4 mg, Xylocaine 4 mg, Toradol 30 mg, labetalol 20 mg, Brevital 70 mg, esmolol 10 mg, succinylcholine 100 mg Post Shock:  None  Seizure Duration:  34 seconds by EMG, 56 seconds by EEG   Comments:  Tolerated treatment well. Patient will follow-up next week with ECT on January 6 then back to resuming her regular schedule.   Lungs:  []   Clear to auscultation               []  Other:   Heart:    []   Regular rhythm             []  irregular rhythm    []   Previous H&P reviewed, patient examined and there are NO CHANGES                 []   Previous H&P reviewed, patient examined and there are changes noted.   [name], MD 12/30/201610:12 AM

## 2015-11-10 NOTE — Transfer of Care (Signed)
Immediate Anesthesia Transfer of Care Note  Patient: Melinda [name]  Procedure(s) Performed: ECT  Patient Location: PACU  Anesthesia Type:General  Level of Consciousness: sedated  Airway & Oxygen Therapy: Patient Spontanous Breathing and Patient connected to face mask oxygen  Post-op Assessment: Report given to RN and Post -op Vital signs reviewed and stable  Post vital signs: Reviewed and stable  Last Vitals:  Filed Vitals:   11/10/15 0813  BP: 139/61  Pulse: 84  Temp: 36.1 C  Resp: 18    Complications: No apparent anesthesia complications

## 2015-11-14 NOTE — Anesthesia Postprocedure Evaluation (Signed)
Anesthesia Post Note  Patient: Melinda [name]  Procedure(s) Performed: * No procedures listed *  Patient location during evaluation: PACU Anesthesia Type: General Level of consciousness: awake and alert Pain management: pain level controlled Vital Signs Assessment: post-procedure vital signs reviewed and stable Respiratory status: spontaneous breathing, nonlabored ventilation, respiratory function stable and patient connected to nasal cannula oxygen Cardiovascular status: blood pressure returned to baseline and stable Postop Assessment: no signs of nausea or vomiting Anesthetic complications: no    Last Vitals:  Filed Vitals:   11/10/15 1030 11/10/15 1104  BP: 128/59 118/52  Pulse: 95 86  Temp: 37.6 C   Resp: 18 18    Last Pain:  Filed Vitals:   11/10/15 1108  PainSc: 0-No pain                 Molli Barrows

## 2015-11-17 ENCOUNTER — Encounter: Payer: Self-pay | Admitting: Anesthesiology

## 2015-11-17 ENCOUNTER — Encounter
Admission: RE | Admit: 2015-11-17 | Discharge: 2015-11-17 | Disposition: A | Discharge status: Home / Self Care | Payer: Medicare PPO | Source: Ambulatory Visit | Attending: Psychiatry | Admitting: Psychiatry

## 2015-11-17 DIAGNOSIS — E1142 Type 2 diabetes mellitus with diabetic polyneuropathy: Secondary | ICD-10-CM | POA: Diagnosis not present

## 2015-11-17 DIAGNOSIS — I10 Essential (primary) hypertension: Secondary | ICD-10-CM | POA: Insufficient documentation

## 2015-11-17 DIAGNOSIS — K219 Gastro-esophageal reflux disease without esophagitis: Secondary | ICD-10-CM | POA: Diagnosis not present

## 2015-11-17 DIAGNOSIS — F332 Major depressive disorder, recurrent severe without psychotic features: Secondary | ICD-10-CM

## 2015-11-17 DIAGNOSIS — E669 Obesity, unspecified: Secondary | ICD-10-CM | POA: Insufficient documentation

## 2015-11-17 DIAGNOSIS — Z888 Allergy status to other drugs, medicaments and biological substances status: Secondary | ICD-10-CM | POA: Diagnosis not present

## 2015-11-17 DIAGNOSIS — E7800 Pure hypercholesterolemia, unspecified: Secondary | ICD-10-CM | POA: Diagnosis not present

## 2015-11-17 LAB — POCT PREGNANCY, URINE: Preg Test, Ur: NEGATIVE

## 2015-11-17 LAB — GLUCOSE, CAPILLARY: Glucose-Capillary: 90 mg/dL (ref 65–99)

## 2015-11-17 MED ORDER — SUCCINYLCHOLINE CHLORIDE 20 MG/ML IJ SOLN
100.0000 mg | Freq: Once | INTRAMUSCULAR | Status: AC
  Administered 2015-11-17: 100 mg via INTRAVENOUS

## 2015-11-17 MED ORDER — SODIUM CHLORIDE 0.9 % IV SOLN
250.0000 mL | Freq: Once | INTRAVENOUS | Status: AC
  Administered 2015-11-17: 250 mL via INTRAVENOUS

## 2015-11-17 MED ORDER — GLYCOPYRROLATE 0.2 MG/ML IJ SOLN
0.4000 mg | Freq: Once | INTRAMUSCULAR | Status: AC
  Administered 2015-11-17: 0.4 mg via INTRAVENOUS

## 2015-11-17 MED ORDER — LABETALOL HCL 5 MG/ML IV SOLN
20.0000 mg | Freq: Once | INTRAVENOUS | Status: AC
  Administered 2015-11-17: 20 mg via INTRAVENOUS

## 2015-11-17 MED ORDER — KETOROLAC TROMETHAMINE 30 MG/ML IJ SOLN
30.0000 mg | Freq: Once | INTRAMUSCULAR | Status: AC
  Administered 2015-11-17: 30 mg via INTRAVENOUS

## 2015-11-17 MED ORDER — ESMOLOL HCL-SODIUM CHLORIDE 2000 MG/100ML IV SOLN
10.0000 mg | Freq: Once | INTRAVENOUS | Status: AC
  Administered 2015-11-17: 10 mg via INTRAVENOUS

## 2015-11-17 MED ORDER — METHOHEXITAL SODIUM 100 MG/10ML IV SOSY
70.0000 mg | PREFILLED_SYRINGE | Freq: Once | INTRAVENOUS | Status: AC
  Administered 2015-11-17: 70 mg via INTRAVENOUS

## 2015-11-17 MED ORDER — LIDOCAINE HCL (CARDIAC) 20 MG/ML IV SOLN
4.0000 mg | Freq: Once | INTRAVENOUS | Status: AC
  Administered 2015-11-17: 4 mg via INTRAVENOUS

## 2015-11-17 NOTE — H&P (Signed)
Melinda [name] is an 45 y.o. female.   Chief Complaint: Patient has no specific new complaints. Chronic depression. No physical complaints. HPI: Chronic severe depression and maintained in partial remission with great improvement with weekly ECT no other new complaints  Past Medical History  Diagnosis Date  . Depression   . Diabetes mellitus without complication ([place])   . GERD (gastroesophageal reflux disease)   . Hypercholesterolemia 03/07/15  . Hypertension   . Obesity 03/07/15  . Personality disorder 03/07/15  . Sinus tachycardia ([place]) 03/07/15    history of  . Suicidal thoughts 03/07/15  . Diabetic peripheral neuropathy ([place]) 03/07/15  . Diabetic peripheral neuropathy ([place]) 03/07/15  . Diabetic peripheral neuropathy ([place]) 03/07/15    Past Surgical History  Procedure Laterality Date  . Electroconvulsion therapy  03/07/15    Family History  Problem Relation Age of Onset  . Hypertension Father   . Diabetes Mother    Social History:  reports that she has never smoked. She has never used smokeless tobacco. She reports that she does not drink alcohol or use illicit drugs.  Allergies:  Allergies  Allergen Reactions  . Prednisone     Increases blood sugar     (Not in a hospital admission)  Results for orders placed or performed during the hospital encounter of 11/17/15 (from the past 48 hour(s))  Pregnancy, urine POC     Status: None   Collection Time: 11/17/15 12:00 PM  Result Value Ref Range   Preg Test, Ur NEGATIVE NEGATIVE    Comment:        THE SENSITIVITY OF THIS METHODOLOGY IS >24 mIU/mL   Glucose, capillary     Status: None   Collection Time: 11/17/15 12:08 PM  Result Value Ref Range   Glucose-Capillary 90 65 - 99 mg/dL   Comment 1 Notify RN    No results found.  Review of Systems  Constitutional: Negative.   HENT: Negative.   Eyes: Negative.   Respiratory: Negative.   Cardiovascular: Negative.   Gastrointestinal: Negative.   Musculoskeletal: Negative.    Skin: Negative.   Neurological: Negative.   Psychiatric/Behavioral: Positive for depression. Negative for suicidal ideas, hallucinations, memory loss and substance abuse. The patient is nervous/anxious and has insomnia.     Blood pressure 129/81, pulse 79, temperature 97.4 F (36.3 C), temperature source Oral, resp. rate 18, height 5\' 4"  (1.626 m), weight 102.967 kg (227 lb), last menstrual period 10/22/2015, SpO2 100 %. Physical Exam  Nursing note and vitals reviewed. Constitutional: She appears well-developed and well-nourished.  HENT:  Head: Normocephalic and atraumatic.  Eyes: Conjunctivae are normal. Pupils are equal, round, and reactive to light.  Neck: Normal range of motion.  Cardiovascular: Normal rate, regular rhythm and normal heart sounds.   Respiratory: Effort normal and breath sounds normal. No respiratory distress.  GI: Soft.  Musculoskeletal: Normal range of motion.  Neurological: She is alert.  Skin: Skin is warm and dry.  Psychiatric: Her speech is normal and behavior is normal. Judgment and thought content normal. Cognition and memory are normal. She exhibits a depressed mood.     Assessment/Plan ECT treatment today as usually scheduled follow-up weekly next treatment follow-up one week on the 13th  [place] 11/17/2015, 2:22 PM

## 2015-11-17 NOTE — Anesthesia Preprocedure Evaluation (Signed)
Anesthesia Evaluation  Patient identified by MRN, date of birth, ID band Patient awake    Reviewed: Allergy & Precautions, NPO status , Patient's Chart, lab work & pertinent test results  Airway Mallampati: III       Dental  (+) Teeth Intact   Pulmonary sleep apnea ,     + decreased breath sounds      Cardiovascular hypertension, Pt. on medications  Rhythm:Regular     Neuro/Psych Depression Bipolar Disorder    GI/Hepatic Neg liver ROS, GERD  ,  Endo/Other  diabetesMorbid obesity  Renal/GU negative Renal ROS     Musculoskeletal   Abdominal (+) + obese,   Peds  Hematology   Anesthesia Other Findings   Reproductive/Obstetrics                             Anesthesia Physical Anesthesia Plan  ASA: III  Anesthesia Plan: General   Post-op Pain Management:    Induction: Intravenous  Airway Management Planned: Natural Airway  Additional Equipment:   Intra-op Plan:   Post-operative Plan:   Informed Consent: I have reviewed the patients History and Physical, chart, labs and discussed the procedure including the risks, benefits and alternatives for the proposed anesthesia with the patient or authorized representative who has indicated his/her understanding and acceptance.     Plan Discussed with: CRNA  Anesthesia Plan Comments:         Anesthesia Quick Evaluation

## 2015-11-17 NOTE — Procedures (Signed)
ECT SERVICES Physician's Interval Evaluation & Treatment Note  Patient Identification: Melinda [name] MRN:  [id number] Date of Evaluation:  11/17/2015 TX #: [id number]  MADRS:   MMSE:   P.E. Findings:  No change to physical exam. Vitals stable. No new findings. Lungs and heart clear.  Psychiatric Interval Note:  Mood is about the same as usual mildly depressed not suicidal.  Subjective:  Patient is a 45 y.o. female seen for evaluation for Electroconvulsive Therapy. No specific new complaint  Treatment Summary:   []   Right Unilateral             [x]  Bilateral   % Energy : 1.0 ms, 35%   Impedance: 840 ohms  Seizure Energy Index: 1675 V squared  Postictal Suppression Index: 90%  Seizure Concordance Index: 83%  Medications  Pre Shock: Robinul 0.4 mg, Xylocaine 4 mg, Toradol 30 mg, labetalol 20 mg, Brevital 70 mg, esmolol 10 mg, succinylcholine 100 mg  Post Shock: None  Seizure Duration: 32 seconds by EMG, 45 seconds by EEG   Comments: Follow-up one week on January 13   Lungs:  [x]   Clear to auscultation               []  Other:   Heart:    [x]   Regular rhythm             []  irregular rhythm    [x]   Previous H&P reviewed, patient examined and there are NO CHANGES                 []   Previous H&P reviewed, patient examined and there are changes noted.   [name], MD 1/6/20172:36 PM

## 2015-11-17 NOTE — Transfer of Care (Signed)
Immediate Anesthesia Transfer of Care Note  Patient: Melinda [name]  Procedure(s) Performed: * No procedures listed *  Patient Location: PACU  Anesthesia Type:General  Level of Consciousness: sedated  Airway & Oxygen Therapy: Patient Spontanous Breathing and Patient connected to face mask oxygen  Post-op Assessment: Report given to RN and Post -op Vital signs reviewed and stable  Post vital signs: Reviewed and stable  Last Vitals:  Filed Vitals:   11/17/15 1112 11/17/15 1451  BP: 129/81 120/88  Pulse: 79 91  Temp: 36.3 C 37.3 C  Resp: 18 18    Complications: No apparent anesthesia complications

## 2015-11-17 NOTE — Anesthesia Postprocedure Evaluation (Signed)
Anesthesia Post Note  Patient: Melinda [name]  Procedure(s) Performed: * No procedures listed *  Patient location during evaluation: PACU Anesthesia Type: General Level of consciousness: awake and alert Pain management: pain level controlled Vital Signs Assessment: post-procedure vital signs reviewed and stable Respiratory status: spontaneous breathing and respiratory function stable Cardiovascular status: stable Anesthetic complications: no    Last Vitals:  Filed Vitals:   11/17/15 1112 11/17/15 1451  BP: 129/81 120/88  Pulse: 79 91  Temp: 36.3 C 37.3 C  Resp: 18 18    Last Pain:  Filed Vitals:   11/17/15 1452  PainSc: 0-No pain                 Keymon Mcelroy K

## 2015-11-24 ENCOUNTER — Encounter: Payer: Self-pay | Admitting: Anesthesiology

## 2015-11-24 ENCOUNTER — Encounter
Admission: RE | Admit: 2015-11-24 | Discharge: 2015-11-24 | Disposition: A | Discharge status: Home / Self Care | Payer: Medicare PPO | Source: Ambulatory Visit | Attending: Psychiatry | Admitting: Psychiatry

## 2015-11-24 DIAGNOSIS — I10 Essential (primary) hypertension: Secondary | ICD-10-CM | POA: Insufficient documentation

## 2015-11-24 DIAGNOSIS — Z888 Allergy status to other drugs, medicaments and biological substances status: Secondary | ICD-10-CM | POA: Insufficient documentation

## 2015-11-24 DIAGNOSIS — E7800 Pure hypercholesterolemia, unspecified: Secondary | ICD-10-CM | POA: Insufficient documentation

## 2015-11-24 DIAGNOSIS — F332 Major depressive disorder, recurrent severe without psychotic features: Secondary | ICD-10-CM | POA: Diagnosis not present

## 2015-11-24 DIAGNOSIS — E669 Obesity, unspecified: Secondary | ICD-10-CM | POA: Diagnosis not present

## 2015-11-24 DIAGNOSIS — E1142 Type 2 diabetes mellitus with diabetic polyneuropathy: Secondary | ICD-10-CM | POA: Insufficient documentation

## 2015-11-24 DIAGNOSIS — K219 Gastro-esophageal reflux disease without esophagitis: Secondary | ICD-10-CM | POA: Insufficient documentation

## 2015-11-24 LAB — GLUCOSE, CAPILLARY: Glucose-Capillary: 108 mg/dL — ABNORMAL HIGH (ref 65–99)

## 2015-11-24 LAB — POCT PREGNANCY, URINE: Preg Test, Ur: NEGATIVE

## 2015-11-24 MED ORDER — LIDOCAINE HCL (CARDIAC) 20 MG/ML IV SOLN
4.0000 mg | Freq: Once | INTRAVENOUS | Status: AC
  Administered 2015-11-24: 4 mg via INTRAVENOUS

## 2015-11-24 MED ORDER — ESMOLOL HCL-SODIUM CHLORIDE 2000 MG/100ML IV SOLN
10.0000 mg | Freq: Once | INTRAVENOUS | Status: AC
  Administered 2015-11-24: 10 mg via INTRAVENOUS

## 2015-11-24 MED ORDER — KETOROLAC TROMETHAMINE 30 MG/ML IJ SOLN
30.0000 mg | Freq: Once | INTRAMUSCULAR | Status: AC
  Administered 2015-11-24: 30 mg via INTRAVENOUS

## 2015-11-24 MED ORDER — LABETALOL HCL 5 MG/ML IV SOLN
20.0000 mg | Freq: Once | INTRAVENOUS | Status: AC
  Administered 2015-11-24: 20 mg via INTRAVENOUS

## 2015-11-24 MED ORDER — SUCCINYLCHOLINE CHLORIDE 20 MG/ML IJ SOLN
100.0000 mg | Freq: Once | INTRAMUSCULAR | Status: AC
  Administered 2015-11-24: 100 mg via INTRAVENOUS

## 2015-11-24 MED ORDER — SODIUM CHLORIDE 0.9 % IV SOLN
250.0000 mL | Freq: Once | INTRAVENOUS | Status: AC
  Administered 2015-11-24: 500 mL via INTRAVENOUS

## 2015-11-24 MED ORDER — GLYCOPYRROLATE 0.2 MG/ML IJ SOLN
0.4000 mg | Freq: Once | INTRAMUSCULAR | Status: AC
Start: 2015-11-24 — End: 2015-11-24
  Administered 2015-11-24: 0.4 mg via INTRAVENOUS

## 2015-11-24 MED ORDER — METHOHEXITAL SODIUM 100 MG/10ML IV SOSY
70.0000 mg | PREFILLED_SYRINGE | Freq: Once | INTRAVENOUS | Status: AC
  Administered 2015-11-24: 70 mg via INTRAVENOUS

## 2015-11-24 NOTE — H&P (Signed)
Melinda [name] is an 45 y.o. female.   Chief Complaint: Patient with a history of recurrent severe depression. Receiving maintenance ECT. No new complaint. Mood is been stable no return of suicidal thinking. No physical complaints no new medical problems HPI: Recurrent severe depression which is been maintained with maintenance ECT treatment on a weekly basis with good response and minimal side effect  Past Medical History  Diagnosis Date  . Depression   . Diabetes mellitus without complication ([place])   . GERD (gastroesophageal reflux disease)   . Hypercholesterolemia 03/07/15  . Hypertension   . Obesity 03/07/15  . Personality disorder 03/07/15  . Sinus tachycardia ([place]) 03/07/15    history of  . Suicidal thoughts 03/07/15  . Diabetic peripheral neuropathy ([place]) 03/07/15  . Diabetic peripheral neuropathy ([place]) 03/07/15  . Diabetic peripheral neuropathy ([place]) 03/07/15    Past Surgical History  Procedure Laterality Date  . Electroconvulsion therapy  03/07/15    Family History  Problem Relation Age of Onset  . Hypertension Father   . Diabetes Mother    Social History:  reports that she has never smoked. She has never used smokeless tobacco. She reports that she does not drink alcohol or use illicit drugs.  Allergies:  Allergies  Allergen Reactions  . Prednisone     Increases blood sugar     (Not in a hospital admission)  Results for orders placed or performed during the hospital encounter of 11/24/15 (from the past 48 hour(s))  Pregnancy, urine POC     Status: None   Collection Time: 11/24/15  8:10 AM  Result Value Ref Range   Preg Test, Ur NEGATIVE NEGATIVE    Comment:        THE SENSITIVITY OF THIS METHODOLOGY IS >24 mIU/mL   Glucose, capillary     Status: Abnormal   Collection Time: 11/24/15  8:14 AM  Result Value Ref Range   Glucose-Capillary 108 (H) 65 - 99 mg/dL   Comment 1 Notify RN    No results found.  Review of Systems  Constitutional: Negative.   HENT:  Negative.   Eyes: Negative.   Respiratory: Negative.   Cardiovascular: Negative.   Gastrointestinal: Negative.   Musculoskeletal: Negative.   Skin: Negative.   Neurological: Negative.   Psychiatric/Behavioral: Negative for depression, suicidal ideas, hallucinations, memory loss and substance abuse. The patient is not nervous/anxious and does not have insomnia.     Blood pressure 123/84, temperature 96.3 F (35.7 C), temperature source Oral, resp. rate 18, height 5\' 4"  (1.626 m), weight 105.688 kg (233 lb), last menstrual period 10/22/2015, SpO2 100 %. Physical Exam  Nursing note and vitals reviewed. Constitutional: She appears well-developed and well-nourished.  HENT:  Head: Normocephalic and atraumatic.  Eyes: Conjunctivae are normal. Pupils are equal, round, and reactive to light.  Neck: Normal range of motion.  Cardiovascular: Normal rate, regular rhythm and normal heart sounds.   Respiratory: Effort normal and breath sounds normal. No respiratory distress.  GI: Soft.  Musculoskeletal: Normal range of motion.  Neurological: She is alert.  Skin: Skin is warm and dry.  Psychiatric: She has a normal mood and affect. Her behavior is normal. Judgment and thought content normal.     Assessment/Plan Treatment today with follow-up next week is usually scheduled no change to medicine no other change to treatment plan.  Tarah Buboltz 11/24/2015, 10:43 AM

## 2015-11-24 NOTE — Transfer of Care (Signed)
Immediate Anesthesia Transfer of Care Note  Patient: Melinda [name]  Procedure(s) Performed: ECT  Patient Location: PACU  Anesthesia Type:General  Level of Consciousness: awake and patient cooperative  Airway & Oxygen Therapy: Patient Spontanous Breathing and Patient connected to face mask oxygen  Post-op Assessment: Report given to RN  Post vital signs: Reviewed and stable  Last Vitals:  Filed Vitals:   11/24/15 0809 11/24/15 1101  BP: 123/84 126/65  Temp: 35.7 C 36.7 C  Resp: 18 21    Complications: No apparent anesthesia complications

## 2015-11-24 NOTE — Anesthesia Procedure Notes (Signed)
Date/Time: 11/24/2015 10:51 AM Performed by: Dionne Bucy Pre-anesthesia Checklist: Patient identified, Emergency Drugs available, Suction available and Patient being monitored Patient Re-evaluated:Patient Re-evaluated prior to inductionOxygen Delivery Method: Circle system utilized Preoxygenation: Pre-oxygenation with 100% oxygen Intubation Type: IV induction Ventilation: Mask ventilation without difficulty and Mask ventilation throughout procedure Airway Equipment and Method: Bite block Placement Confirmation: positive ETCO2 Dental Injury: Teeth and Oropharynx as per pre-operative assessment

## 2015-11-24 NOTE — Anesthesia Preprocedure Evaluation (Signed)
Anesthesia Evaluation  Patient identified by MRN, date of birth, ID band Patient awake    Reviewed: Allergy & Precautions, NPO status , Patient's Chart, lab work & pertinent test results  Airway Mallampati: III       Dental  (+) Teeth Intact   Pulmonary neg pulmonary ROS, sleep apnea ,     + decreased breath sounds      Cardiovascular hypertension, Pt. on medications negative cardio ROS   Rhythm:Regular     Neuro/Psych Depression Bipolar Disorder negative neurological ROS  negative psych ROS   GI/Hepatic negative GI ROS, Neg liver ROS, GERD  ,  Endo/Other  negative endocrine ROSdiabetesMorbid obesity  Renal/GU negative Renal ROS  negative genitourinary   Musculoskeletal negative musculoskeletal ROS (+)   Abdominal (+) + obese,   Peds negative pediatric ROS (+)  Hematology negative hematology ROS (+)   Anesthesia Other Findings   Reproductive/Obstetrics negative OB ROS                             Anesthesia Physical  Anesthesia Plan  ASA: III  Anesthesia Plan: General   Post-op Pain Management:    Induction: Intravenous  Airway Management Planned: Natural Airway  Additional Equipment:   Intra-op Plan:   Post-operative Plan:   Informed Consent: I have reviewed the patients History and Physical, chart, labs and discussed the procedure including the risks, benefits and alternatives for the proposed anesthesia with the patient or authorized representative who has indicated his/her understanding and acceptance.     Plan Discussed with: CRNA  Anesthesia Plan Comments:         Anesthesia Quick Evaluation

## 2015-11-24 NOTE — Procedures (Signed)
ECT SERVICES Physician's Interval Evaluation & Treatment Note  Patient Identification: Melinda [name] MRN:  [id number] Date of Evaluation:  11/24/2015 TX #: [id number]  MADRS:   MMSE:   P.E. Findings:  Physical exam unremarkable lungs and heart normal vital signs stable  Psychiatric Interval Note:  Mood is normal no return of suicidal thoughts still little motivation but no change from current baseline  Subjective:  Patient is a 45 y.o. female seen for evaluation for Electroconvulsive Therapy. Feeling well no new complaint  Treatment Summary:   []   Right Unilateral             [x]  Bilateral   % Energy : 1.0 ms, 35%   Impedance: 1160 ohms  Seizure Energy Index: 2043 V squared  Postictal Suppression Index: 84%  Seizure Concordance Index: 79%  Medications  Pre Shock: Robinul 0.4 mg, Xylocaine 4 mg, Toradol 30 mg, labetalol 20 mg, Brevital 70 mg, esmolol 10 mg, succinylcholine 100 mg  Post Shock: None  Seizure Duration: 31 seconds by EMG, 44 seconds by EEG   Comments: Patient is doing well tolerating treatment. Follow-up in one week on the 20th   Lungs:  [x]   Clear to auscultation               []  Other:   Heart:    [x]   Regular rhythm             []  irregular rhythm    [x]   Previous H&P reviewed, patient examined and there are NO CHANGES                 []   Previous H&P reviewed, patient examined and there are changes noted.   [name], MD 1/13/201710:44 AM

## 2015-11-24 NOTE — Anesthesia Postprocedure Evaluation (Signed)
Anesthesia Post Note  Patient: Melinda [name]  Procedure(s) Performed: * No procedures listed *  Patient location during evaluation: PACU Anesthesia Type: General Level of consciousness: awake and alert Pain management: pain level controlled Vital Signs Assessment: post-procedure vital signs reviewed and stable Respiratory status: spontaneous breathing and respiratory function stable Cardiovascular status: stable Anesthetic complications: no    Last Vitals:  Filed Vitals:   11/24/15 1131 11/24/15 1137  BP: 108/58   Pulse: 80   Temp:    Resp:  18    Last Pain:  Filed Vitals:   11/24/15 1137  PainSc: 0-No pain                 Elisheba Mcdonnell K

## 2015-12-01 ENCOUNTER — Encounter: Payer: Self-pay | Admitting: Anesthesiology

## 2015-12-01 ENCOUNTER — Encounter
Admission: RE | Admit: 2015-12-01 | Discharge: 2015-12-01 | Disposition: A | Discharge status: Home / Self Care | Payer: Medicare PPO | Source: Ambulatory Visit | Attending: Psychiatry | Admitting: Psychiatry

## 2015-12-01 DIAGNOSIS — F332 Major depressive disorder, recurrent severe without psychotic features: Secondary | ICD-10-CM

## 2015-12-01 DIAGNOSIS — E1142 Type 2 diabetes mellitus with diabetic polyneuropathy: Secondary | ICD-10-CM | POA: Insufficient documentation

## 2015-12-01 DIAGNOSIS — I10 Essential (primary) hypertension: Secondary | ICD-10-CM | POA: Insufficient documentation

## 2015-12-01 DIAGNOSIS — E7800 Pure hypercholesterolemia, unspecified: Secondary | ICD-10-CM | POA: Diagnosis not present

## 2015-12-01 DIAGNOSIS — E669 Obesity, unspecified: Secondary | ICD-10-CM | POA: Insufficient documentation

## 2015-12-01 DIAGNOSIS — K219 Gastro-esophageal reflux disease without esophagitis: Secondary | ICD-10-CM | POA: Insufficient documentation

## 2015-12-01 DIAGNOSIS — Z888 Allergy status to other drugs, medicaments and biological substances status: Secondary | ICD-10-CM | POA: Insufficient documentation

## 2015-12-01 LAB — GLUCOSE, CAPILLARY: Glucose-Capillary: 77 mg/dL (ref 65–99)

## 2015-12-01 LAB — POCT PREGNANCY, URINE: Preg Test, Ur: NEGATIVE

## 2015-12-01 MED ORDER — ESMOLOL HCL-SODIUM CHLORIDE 2000 MG/100ML IV SOLN: 10.0000 mg | Freq: Once | INTRAVENOUS | Status: DC

## 2015-12-01 MED ORDER — SUCCINYLCHOLINE CHLORIDE 20 MG/ML IJ SOLN
100.0000 mg | Freq: Once | INTRAMUSCULAR | Status: AC
  Administered 2015-12-01: 100 mg via INTRAVENOUS

## 2015-12-01 MED ORDER — METHOHEXITAL SODIUM 100 MG/10ML IV SOSY
70.0000 mg | PREFILLED_SYRINGE | Freq: Once | INTRAVENOUS | Status: AC
  Administered 2015-12-01: 70 mg via INTRAVENOUS

## 2015-12-01 MED ORDER — LIDOCAINE HCL (CARDIAC) 20 MG/ML IV SOLN
4.0000 mg | Freq: Once | INTRAVENOUS | Status: AC
  Administered 2015-12-01: 4 mg via INTRAVENOUS

## 2015-12-01 MED ORDER — SODIUM CHLORIDE 0.9 % IV SOLN
250.0000 mL | Freq: Once | INTRAVENOUS | Status: AC
  Administered 2015-12-01: 500 mL via INTRAVENOUS

## 2015-12-01 MED ORDER — GLYCOPYRROLATE 0.2 MG/ML IJ SOLN
0.4000 mg | Freq: Once | INTRAMUSCULAR | Status: AC
  Administered 2015-12-01: 0.4 mg via INTRAVENOUS

## 2015-12-01 MED ORDER — LABETALOL HCL 5 MG/ML IV SOLN
20.0000 mg | Freq: Once | INTRAVENOUS | Status: AC
  Administered 2015-12-01: 20 mg via INTRAVENOUS

## 2015-12-01 MED ORDER — DEXTROSE 5 % IV SOLN
250.0000 mL | Freq: Once | INTRAVENOUS | Status: AC
  Administered 2015-12-01: 250 mL via INTRAVENOUS

## 2015-12-01 MED ORDER — KETOROLAC TROMETHAMINE 30 MG/ML IJ SOLN
30.0000 mg | Freq: Once | INTRAMUSCULAR | Status: AC
  Administered 2015-12-01: 30 mg via INTRAVENOUS

## 2015-12-01 NOTE — Anesthesia Procedure Notes (Signed)
Date/Time: 12/01/2015 10:43 AM Performed by: Dionne Bucy Pre-anesthesia Checklist: Patient identified, Emergency Drugs available, Suction available and Patient being monitored Patient Re-evaluated:Patient Re-evaluated prior to inductionOxygen Delivery Method: Circle system utilized Preoxygenation: Pre-oxygenation with 100% oxygen Intubation Type: IV induction Ventilation: Mask ventilation without difficulty and Mask ventilation throughout procedure Airway Equipment and Method: Bite block Placement Confirmation: positive ETCO2 Dental Injury: Teeth and Oropharynx as per pre-operative assessment

## 2015-12-01 NOTE — Transfer of Care (Signed)
Immediate Anesthesia Transfer of Care Note  Patient: Melinda [name]  Procedure(s) Performed: ECT  Patient Location: PACU  Anesthesia Type:General  Level of Consciousness: sedated  Airway & Oxygen Therapy: Patient Spontanous Breathing and Patient connected to face mask oxygen  Post-op Assessment: Report given to RN  Post vital signs: Reviewed and stable  Last Vitals:  Filed Vitals:   12/01/15 0836 12/01/15 1056  BP: 112/67 120/65  Pulse: 89 82  Temp: 37 C 37.2 C  Resp: 18 17    Complications: No apparent anesthesia complications

## 2015-12-01 NOTE — H&P (Signed)
Melinda [name] is an 45 y.o. female.  g HPI: Patient with chronic severe depression who is stabilized on regular maintenance ECT treatment Chief Complaint: No new complaint. Patient is doing well. Mood is stable  Past Medical History  Diagnosis Date  . Depression   . Diabetes mellitus without complication ([place])   . GERD (gastroesophageal reflux disease)   . Hypercholesterolemia 03/07/15  . Hypertension   . Obesity 03/07/15  . Personality disorder 03/07/15  . Sinus tachycardia ([place]) 03/07/15    history of  . Suicidal thoughts 03/07/15  . Diabetic peripheral neuropathy ([place]) 03/07/15  . Diabetic peripheral neuropathy ([place]) 03/07/15  . Diabetic peripheral neuropathy ([place]) 03/07/15    Past Surgical History  Procedure Laterality Date  . Electroconvulsion therapy  03/07/15    Family History  Problem Relation Age of Onset  . Hypertension Father   . Diabetes Mother    Social History:  reports that she has never smoked. She has never used smokeless tobacco. She reports that she does not drink alcohol or use illicit drugs.  Allergies:  Allergies  Allergen Reactions  . Prednisone     Increases blood sugar     (Not in a hospital admission)  Results for orders placed or performed during the hospital encounter of 12/01/15 (from the past 48 hour(s))  Glucose, capillary     Status: None   Collection Time: 12/01/15  8:43 AM  Result Value Ref Range   Glucose-Capillary 77 65 - 99 mg/dL   Comment 1 AS USUAL PER PT    Comment 2 Notify RN    Comment 3 Document in Chart   Pregnancy, urine POC     Status: None   Collection Time: 12/01/15  8:47 AM  Result Value Ref Range   Preg Test, Ur NEGATIVE NEGATIVE    Comment:        THE SENSITIVITY OF THIS METHODOLOGY IS >24 mIU/mL    No results found.  Review of Systems  Constitutional: Negative.   HENT: Negative.   Eyes: Negative.   Respiratory: Negative.   Cardiovascular: Negative.   Gastrointestinal: Negative.   Musculoskeletal:  Negative.   Skin: Negative.   Neurological: Negative.   Psychiatric/Behavioral: Negative for depression, suicidal ideas, hallucinations, memory loss and substance abuse. The patient is not nervous/anxious and does not have insomnia.     Blood pressure 112/67, pulse 89, temperature 98.6 F (37 C), resp. rate 18, height 5\' 4"  (1.626 m), weight 104.781 kg (231 lb), last menstrual period 10/22/2015, SpO2 100 %. Physical Exam  Nursing note and vitals reviewed. Constitutional: She appears well-developed and well-nourished.  HENT:  Head: Normocephalic and atraumatic.  Eyes: Conjunctivae are normal. Pupils are equal, round, and reactive to light.  Neck: Normal range of motion.  Cardiovascular: Normal rate, regular rhythm and normal heart sounds.   Respiratory: Effort normal and breath sounds normal. No respiratory distress.  GI: Soft.  Musculoskeletal: Normal range of motion.  Neurological: She is alert.  Skin: Skin is warm and dry.  Psychiatric: She has a normal mood and affect. Her behavior is normal. Judgment and thought content normal.     Assessment/Plan Treatment today in follow-up in one week on Friday no change to treatment plan continue current medication  Deadra Diggins 12/01/2015, 10:37 AM

## 2015-12-01 NOTE — Anesthesia Preprocedure Evaluation (Signed)
Anesthesia Evaluation  Patient identified by MRN, date of birth, ID band Patient awake    Reviewed: Allergy & Precautions, NPO status   Airway Mallampati: IV  TM Distance: >3 FB Neck ROM: Limited    Dental  (+) Teeth Intact   Pulmonary sleep apnea and Continuous Positive Airway Pressure Ventilation ,    Pulmonary exam normal        Cardiovascular Exercise Tolerance: Poor hypertension, Pt. on medications Normal cardiovascular exam     Neuro/Psych Depression Bipolar Disorder DIabetic neuropathy.  Neuromuscular disease    GI/Hepatic GERD  Medicated and Controlled,  Endo/Other  diabetes, Type 2Morbid obesityBG 77.  Renal/GU      Musculoskeletal   Abdominal (+) + obese,   Peds  Hematology   Anesthesia Other Findings   Reproductive/Obstetrics                             Anesthesia Physical Anesthesia Plan  ASA: III  Anesthesia Plan: General   Post-op Pain Management:    Induction: Intravenous  Airway Management Planned: Mask  Additional Equipment:   Intra-op Plan:   Post-operative Plan:   Informed Consent: I have reviewed the patients History and Physical, chart, labs and discussed the procedure including the risks, benefits and alternatives for the proposed anesthesia with the patient or authorized representative who has indicated his/her understanding and acceptance.     Plan Discussed with:   Anesthesia Plan Comments:         Anesthesia Quick Evaluation

## 2015-12-01 NOTE — Procedures (Signed)
ECT SERVICES Physician's Interval Evaluation & Treatment Note  Patient Identification: Melinda [name] MRN:  [id number] Date of Evaluation:  12/01/2015 TX #: 210  MADRS: 17  MMSE: 29  P.E. Findings:  No change to physical exam lungs and heart normal vitals stable.  Psychiatric Interval Note:  Patient's mood and affect are stable no remarkable changes  Subjective:  Patient is a 45 y.o. female seen for evaluation for Electroconvulsive Therapy. No new complaint  Treatment Summary:   []   Right Unilateral             [x]  Bilateral   % Energy : 1.0 ms 35%   Impedance: 1860 ohms  Seizure Energy Index: 5625 V squared  Postictal Suppression Index: 94%  Seizure Concordance Index: 97%  Medications  Pre Shock: Robinul 0.4 mg, Xylocaine 4 mg, labetalol 20 mg, Toradol 30 mg, Brevital 70 mg, esmolol 10 mg, succinylcholine 100 mg  Post Shock: None  Seizure Duration: 32 seconds by EMG, 70 seconds by EEG  Comments: Follow-up one week on the 27th   Lungs:  [x]   Clear to auscultation               []  Other:   Heart:    [x]   Regular rhythm             []  irregular rhythm    [x]   Previous H&P reviewed, patient examined and there are NO CHANGES                 []   Previous H&P reviewed, patient examined and there are changes noted.   [name], MD 1/20/201710:39 AM

## 2015-12-01 NOTE — Anesthesia Postprocedure Evaluation (Signed)
Anesthesia Post Note  Patient: Melinda [name]  Procedure(s) Performed: * No procedures listed *  Patient location during evaluation: PACU Anesthesia Type: General Level of consciousness: awake Pain management: pain level controlled Vital Signs Assessment: post-procedure vital signs reviewed and stable Respiratory status: spontaneous breathing Cardiovascular status: blood pressure returned to baseline Postop Assessment: no headache Anesthetic complications: no    Last Vitals:  Filed Vitals:   12/01/15 1125 12/01/15 1133  BP: 118/68 114/72  Pulse: 81 78  Temp:    Resp: 25 16    Last Pain:  Filed Vitals:   12/01/15 1137  PainSc: 4                  [name]

## 2015-12-08 ENCOUNTER — Encounter: Payer: Medicare PPO | Admitting: Anesthesiology

## 2015-12-08 ENCOUNTER — Encounter: Payer: Self-pay | Admitting: Anesthesiology

## 2015-12-08 ENCOUNTER — Encounter
Admission: RE | Admit: 2015-12-08 | Discharge: 2015-12-08 | Disposition: A | Discharge status: Home / Self Care | Payer: Medicare PPO | Source: Ambulatory Visit | Attending: Psychiatry | Admitting: Psychiatry

## 2015-12-08 DIAGNOSIS — K219 Gastro-esophageal reflux disease without esophagitis: Secondary | ICD-10-CM | POA: Insufficient documentation

## 2015-12-08 DIAGNOSIS — E7800 Pure hypercholesterolemia, unspecified: Secondary | ICD-10-CM | POA: Diagnosis not present

## 2015-12-08 DIAGNOSIS — Z888 Allergy status to other drugs, medicaments and biological substances status: Secondary | ICD-10-CM | POA: Insufficient documentation

## 2015-12-08 DIAGNOSIS — F609 Personality disorder, unspecified: Secondary | ICD-10-CM | POA: Insufficient documentation

## 2015-12-08 DIAGNOSIS — F332 Major depressive disorder, recurrent severe without psychotic features: Secondary | ICD-10-CM | POA: Insufficient documentation

## 2015-12-08 DIAGNOSIS — E669 Obesity, unspecified: Secondary | ICD-10-CM | POA: Insufficient documentation

## 2015-12-08 DIAGNOSIS — E1142 Type 2 diabetes mellitus with diabetic polyneuropathy: Secondary | ICD-10-CM | POA: Diagnosis not present

## 2015-12-08 DIAGNOSIS — I10 Essential (primary) hypertension: Secondary | ICD-10-CM | POA: Insufficient documentation

## 2015-12-08 LAB — GLUCOSE, CAPILLARY: Glucose-Capillary: 75 mg/dL (ref 65–99)

## 2015-12-08 LAB — POCT PREGNANCY, URINE: Preg Test, Ur: NEGATIVE

## 2015-12-08 MED ORDER — GLYCOPYRROLATE 0.2 MG/ML IJ SOLN
0.4000 mg | Freq: Once | INTRAMUSCULAR | Status: AC
  Administered 2015-12-08: 0.4 mg via INTRAVENOUS

## 2015-12-08 MED ORDER — SUCCINYLCHOLINE 20MG/ML (10ML) SYRINGE FOR MEDFUSION PUMP - OPTIME
INTRAMUSCULAR | Status: DC | PRN
  Administered 2015-12-08: 100 mg via INTRAVENOUS

## 2015-12-08 MED ORDER — ESMOLOL HCL 100 MG/10ML IV SOLN
INTRAVENOUS | Status: DC | PRN
  Administered 2015-12-08: 10 mg via INTRAVENOUS

## 2015-12-08 MED ORDER — SODIUM CHLORIDE 0.9 % IV SOLN
250.0000 mL | Freq: Once | INTRAVENOUS | Status: AC
  Administered 2015-12-08: 500 mL via INTRAVENOUS

## 2015-12-08 MED ORDER — SODIUM CHLORIDE 0.9 % IV SOLN
INTRAVENOUS | Status: DC | PRN
  Administered 2015-12-08: 10:00:00 via INTRAVENOUS

## 2015-12-08 MED ORDER — LABETALOL HCL 5 MG/ML IV SOLN
INTRAVENOUS | Status: DC | PRN
  Administered 2015-12-08: 20 mg via INTRAVENOUS

## 2015-12-08 MED ORDER — METHOHEXITAL SODIUM 100 MG/10ML IV SOSY
PREFILLED_SYRINGE | INTRAVENOUS | Status: DC | PRN
  Administered 2015-12-08: 70 mg via INTRAVENOUS

## 2015-12-08 MED ORDER — KETOROLAC TROMETHAMINE 30 MG/ML IJ SOLN
30.0000 mg | Freq: Once | INTRAMUSCULAR | Status: AC
  Administered 2015-12-08: 30 mg via INTRAVENOUS

## 2015-12-08 NOTE — Procedures (Signed)
ECT SERVICES Physician's Interval Evaluation & Treatment Note  Patient Identification: PRYCE BUNGERT MRN:  [id number] Date of Evaluation:  12/08/2015 TX #: 211  MADRS:   MMSE:   P.E. Findings:  No change to physical exam. Vital signs stable. Heart and lungs exam normal.  Psychiatric Interval Note:  Mood stable no return of suicidal ideation. Euthymic.  Subjective:  Patient is a 45 y.o. female seen for evaluation for Electroconvulsive Therapy. No specific new complaint  Treatment Summary:   []   Right Unilateral             [x]  Bilateral   % Energy : 1.0 ms 35%   Impedance: 1340 ohms  Seizure Energy Index: 8536 V squared  Postictal Suppression Index: 92%  Seizure Concordance Index: 99%  Medications  Pre Shock: Robinul 0.4 mg, Toradol 30 mg, labetalol 20 mg, Brevital 70 mg, esmolol 10 mg, succinylcholine 100 mg  Post Shock: None  Seizure Duration: 52 seconds by EMG, 93 seconds by EEG   Comments: Follow-up in one week on 12/15/2015   Lungs:  [x]   Clear to auscultation               []  Other:   Heart:    [x]   Regular rhythm             []  irregular rhythm    [x]   Previous H&P reviewed, patient examined and there are NO CHANGES                 []   Previous H&P reviewed, patient examined and there are changes noted.   [name], MD 1/27/201710:32 AM

## 2015-12-08 NOTE — Anesthesia Postprocedure Evaluation (Signed)
Anesthesia Post Note  Patient: Melinda [name]  Procedure(s) Performed: * No procedures listed *  Patient location during evaluation: PACU Anesthesia Type: General Level of consciousness: awake and alert Pain management: pain level controlled Vital Signs Assessment: post-procedure vital signs reviewed and stable Respiratory status: spontaneous breathing, nonlabored ventilation, respiratory function stable and patient connected to nasal cannula oxygen Cardiovascular status: blood pressure returned to baseline and stable Postop Assessment: no signs of nausea or vomiting Anesthetic complications: no    Last Vitals:  Filed Vitals:   12/08/15 1132 12/08/15 1144  BP: 110/88 119/55  Pulse: 88 81  Temp: 36.5 C   Resp: 18     Last Pain:  Filed Vitals:   12/08/15 1146  PainSc: 0-No pain                 Precious Haws Aarik Blank

## 2015-12-08 NOTE — H&P (Signed)
Melinda [name] is an 45 y.o. female.   Chief Complaint: Patient's mood is been stable no return of suicidal ideation no self injury. HPI: Patient with recurrent severe depression maintaining stability on weekly ECT treatments  Past Medical History  Diagnosis Date  . Depression   . Diabetes mellitus without complication ([place])   . GERD (gastroesophageal reflux disease)   . Hypercholesterolemia 03/07/15  . Hypertension   . Obesity 03/07/15  . Personality disorder 03/07/15  . Sinus tachycardia ([place]) 03/07/15    history of  . Suicidal thoughts 03/07/15  . Diabetic peripheral neuropathy ([place]) 03/07/15  . Diabetic peripheral neuropathy ([place]) 03/07/15  . Diabetic peripheral neuropathy ([place]) 03/07/15    Past Surgical History  Procedure Laterality Date  . Electroconvulsion therapy  03/07/15    Family History  Problem Relation Age of Onset  . Hypertension Father   . Diabetes Mother    Social History:  reports that she has never smoked. She has never used smokeless tobacco. She reports that she does not drink alcohol or use illicit drugs.  Allergies:  Allergies  Allergen Reactions  . Prednisone     Increases blood sugar     (Not in a hospital admission)  Results for orders placed or performed during the hospital encounter of 12/08/15 (from the past 48 hour(s))  Glucose, capillary     Status: None   Collection Time: 12/08/15  8:10 AM  Result Value Ref Range   Glucose-Capillary 75 65 - 99 mg/dL   Comment 1 Notify RN    Comment 2 Document in Chart   Pregnancy, urine POC     Status: None   Collection Time: 12/08/15  8:12 AM  Result Value Ref Range   Preg Test, Ur NEGATIVE NEGATIVE    Comment:        THE SENSITIVITY OF THIS METHODOLOGY IS >24 mIU/mL    No results found.  Review of Systems  Constitutional: Negative.   HENT: Negative.   Eyes: Negative.   Respiratory: Negative.   Cardiovascular: Negative.   Gastrointestinal: Negative.   Musculoskeletal: Negative.   Skin:  Negative.   Neurological: Negative.   Psychiatric/Behavioral: Negative for depression, suicidal ideas, hallucinations, memory loss and substance abuse. The patient is not nervous/anxious and does not have insomnia.     Blood pressure 133/73, pulse 82, temperature 97.6 F (36.4 C), height 5\' 5"  (1.651 m), weight 103.874 kg (229 lb), last menstrual period 10/22/2015, SpO2 96 %. Physical Exam  Nursing note and vitals reviewed. Constitutional: She appears well-developed and well-nourished.  HENT:  Head: Normocephalic and atraumatic.  Eyes: Conjunctivae are normal. Pupils are equal, round, and reactive to light.  Neck: Normal range of motion.  Cardiovascular: Normal rate, regular rhythm and normal heart sounds.   Respiratory: Effort normal and breath sounds normal. No respiratory distress.  GI: Soft.  Musculoskeletal: Normal range of motion.  Neurological: She is alert.  Skin: Skin is warm and dry.     Assessment/Plan Treatment today and follow-up in one week on February 3. Reviewed medication. Patient is continuing to be agreeable to the plan.  Melinda [name] 12/08/2015, 10:44 AM

## 2015-12-08 NOTE — Transfer of Care (Signed)
Immediate Anesthesia Transfer of Care Note  Patient: Melinda [name]  Procedure(s) Performed: ECT  Patient Location: PACU  Anesthesia Type:General  Level of Consciousness: awake  Airway & Oxygen Therapy: Patient Spontanous Breathing and Patient connected to face mask oxygen  Post-op Assessment: Report given to RN  Post vital signs: Reviewed and stable  Last Vitals:  Filed Vitals:   12/08/15 0810  BP: 133/73  Pulse: 82  Temp: [id number] C    Complications: No apparent anesthesia complications

## 2015-12-08 NOTE — Anesthesia Procedure Notes (Signed)
Date/Time: 12/08/2015 10:38 AM Performed by: Dionne Bucy Pre-anesthesia Checklist: Patient identified, Emergency Drugs available, Suction available and Patient being monitored Patient Re-evaluated:Patient Re-evaluated prior to inductionOxygen Delivery Method: Circle system utilized Preoxygenation: Pre-oxygenation with 100% oxygen Intubation Type: IV induction Ventilation: Mask ventilation without difficulty and Mask ventilation throughout procedure Airway Equipment and Method: Bite block Placement Confirmation: positive ETCO2 Dental Injury: Teeth and Oropharynx as per pre-operative assessment

## 2015-12-08 NOTE — Anesthesia Preprocedure Evaluation (Addendum)
Anesthesia Evaluation  Patient identified by MRN, date of birth, ID band Patient awake    Reviewed: Allergy & Precautions, H&P , NPO status , Patient's Chart, lab work & pertinent test results  History of Anesthesia Complications Negative for: history of anesthetic complications  Airway Mallampati: IV  TM Distance: >3 FB Neck ROM: Limited    Dental  (+) Poor Dentition   Pulmonary sleep apnea and Continuous Positive Airway Pressure Ventilation ,    Pulmonary exam normal        Cardiovascular Exercise Tolerance: Poor hypertension, Pt. on medications Normal cardiovascular exam     Neuro/Psych PSYCHIATRIC DISORDERS Depression Bipolar Disorder DIabetic neuropathy.  Neuromuscular disease    GI/Hepatic GERD  Medicated and Controlled,  Endo/Other  diabetes, Type 2Morbid obesityBG 77.  Renal/GU      Musculoskeletal   Abdominal (+) + obese,   Peds  Hematology   Anesthesia Other Findings Past Medical History:   Depression                                                   Diabetes mellitus without complication ([place])                 GERD (gastroesophageal reflux disease)                       Hypercholesterolemia                            03/07/15      Hypertension                                                 Obesity                                         03/07/15      Personality disorder                            03/07/15      Sinus tachycardia ([place])                         03/07/15        Comment:history of   Suicidal thoughts                               03/07/15      Diabetic peripheral neuropathy ([place])            03/07/15      Diabetic peripheral neuropathy ([place])            03/07/15      Diabetic peripheral neuropathy ([place])            03/07/15     BMI    Body Mass Index   38.10 kg/m 2      Reproductive/Obstetrics  Anesthesia Physical  Anesthesia Plan  ASA:  III  Anesthesia Plan: General   Post-op Pain Management:    Induction: Intravenous  Airway Management Planned: Mask  Additional Equipment:   Intra-op Plan:   Post-operative Plan:   Informed Consent: I have reviewed the patients History and Physical, chart, labs and discussed the procedure including the risks, benefits and alternatives for the proposed anesthesia with the patient or authorized representative who has indicated his/her understanding and acceptance.     Plan Discussed with:   Anesthesia Plan Comments:         Anesthesia Quick Evaluation

## 2015-12-15 ENCOUNTER — Encounter: Payer: Medicare PPO | Admitting: Anesthesiology

## 2015-12-15 ENCOUNTER — Encounter
Admission: RE | Admit: 2015-12-15 | Discharge: 2015-12-15 | Disposition: A | Discharge status: Home / Self Care | Payer: Medicare PPO | Source: Ambulatory Visit | Attending: Psychiatry | Admitting: Psychiatry

## 2015-12-15 ENCOUNTER — Encounter: Payer: Self-pay | Admitting: Anesthesiology

## 2015-12-15 DIAGNOSIS — E669 Obesity, unspecified: Secondary | ICD-10-CM | POA: Diagnosis not present

## 2015-12-15 DIAGNOSIS — K219 Gastro-esophageal reflux disease without esophagitis: Secondary | ICD-10-CM | POA: Diagnosis not present

## 2015-12-15 DIAGNOSIS — E7800 Pure hypercholesterolemia, unspecified: Secondary | ICD-10-CM | POA: Insufficient documentation

## 2015-12-15 DIAGNOSIS — Z888 Allergy status to other drugs, medicaments and biological substances status: Secondary | ICD-10-CM | POA: Insufficient documentation

## 2015-12-15 DIAGNOSIS — I10 Essential (primary) hypertension: Secondary | ICD-10-CM | POA: Insufficient documentation

## 2015-12-15 DIAGNOSIS — F332 Major depressive disorder, recurrent severe without psychotic features: Secondary | ICD-10-CM | POA: Diagnosis not present

## 2015-12-15 DIAGNOSIS — Z3202 Encounter for pregnancy test, result negative: Secondary | ICD-10-CM | POA: Diagnosis not present

## 2015-12-15 DIAGNOSIS — F609 Personality disorder, unspecified: Secondary | ICD-10-CM | POA: Insufficient documentation

## 2015-12-15 DIAGNOSIS — E1142 Type 2 diabetes mellitus with diabetic polyneuropathy: Secondary | ICD-10-CM | POA: Insufficient documentation

## 2015-12-15 DIAGNOSIS — E119 Type 2 diabetes mellitus without complications: Secondary | ICD-10-CM | POA: Diagnosis not present

## 2015-12-15 LAB — POCT PREGNANCY, URINE: Preg Test, Ur: NEGATIVE

## 2015-12-15 LAB — GLUCOSE, CAPILLARY: Glucose-Capillary: 86 mg/dL (ref 65–99)

## 2015-12-15 MED ORDER — SODIUM CHLORIDE 0.9 % IV SOLN
250.0000 mL | Freq: Once | INTRAVENOUS | Status: AC
  Administered 2015-12-15: 500 mL via INTRAVENOUS

## 2015-12-15 MED ORDER — LABETALOL HCL 5 MG/ML IV SOLN
INTRAVENOUS | Status: DC | PRN
  Administered 2015-12-15: 20 mg via INTRAVENOUS

## 2015-12-15 MED ORDER — METHOHEXITAL SODIUM 100 MG/10ML IV SOSY
PREFILLED_SYRINGE | INTRAVENOUS | Status: DC | PRN
  Administered 2015-12-15: 70 mg via INTRAVENOUS

## 2015-12-15 MED ORDER — SODIUM CHLORIDE 0.9 % IV SOLN
INTRAVENOUS | Status: DC | PRN
  Administered 2015-12-15: 10:00:00 via INTRAVENOUS

## 2015-12-15 MED ORDER — FENTANYL CITRATE (PF) 100 MCG/2ML IJ SOLN: 25.0000 ug | INTRAMUSCULAR | Status: DC | PRN

## 2015-12-15 MED ORDER — SUCCINYLCHOLINE CHLORIDE 20 MG/ML IJ SOLN
INTRAMUSCULAR | Status: DC | PRN
  Administered 2015-12-15: 100 mg via INTRAVENOUS

## 2015-12-15 MED ORDER — ONDANSETRON HCL 4 MG/2ML IJ SOLN: 4.0000 mg | Freq: Once | INTRAMUSCULAR | Status: DC | PRN

## 2015-12-15 MED ORDER — GLYCOPYRROLATE 0.2 MG/ML IJ SOLN
0.4000 mg | Freq: Once | INTRAMUSCULAR | Status: AC
  Administered 2015-12-15: 0.4 mg via INTRAVENOUS

## 2015-12-15 MED ORDER — KETOROLAC TROMETHAMINE 30 MG/ML IJ SOLN
30.0000 mg | Freq: Once | INTRAMUSCULAR | Status: AC
  Administered 2015-12-15: 30 mg via INTRAVENOUS

## 2015-12-15 NOTE — H&P (Signed)
Melinda [name] is an 45 y.o. female.   Chief Complaint: Patient with no specific new complaint. Mood has been stable. Physically doing well. No suicidal ideation reported. HPI: Patient with severe recurrent major depression receiving maintenance ECT treatment. No new problems. Blood sugars under control. Patient does well with weekly treatment without significant side effects.  Past Medical History  Diagnosis Date  . Depression   . Diabetes mellitus without complication ([place])   . GERD (gastroesophageal reflux disease)   . Hypercholesterolemia 03/07/15  . Hypertension   . Obesity 03/07/15  . Personality disorder 03/07/15  . Sinus tachycardia ([place]) 03/07/15    history of  . Suicidal thoughts 03/07/15  . Diabetic peripheral neuropathy ([place]) 03/07/15  . Diabetic peripheral neuropathy ([place]) 03/07/15  . Diabetic peripheral neuropathy ([place]) 03/07/15    Past Surgical History  Procedure Laterality Date  . Electroconvulsion therapy  03/07/15    Family History  Problem Relation Age of Onset  . Hypertension Father   . Diabetes Mother    Social History:  reports that she has never smoked. She has never used smokeless tobacco. She reports that she does not drink alcohol or use illicit drugs.  Allergies:  Allergies  Allergen Reactions  . Prednisone     Increases blood sugar     (Not in a hospital admission)  Results for orders placed or performed during the hospital encounter of 12/15/15 (from the past 48 hour(s))  Glucose, capillary     Status: None   Collection Time: 12/15/15  8:20 AM  Result Value Ref Range   Glucose-Capillary 86 65 - 99 mg/dL  Pregnancy, urine POC     Status: None   Collection Time: 12/15/15  8:22 AM  Result Value Ref Range   Preg Test, Ur NEGATIVE NEGATIVE    Comment:        THE SENSITIVITY OF THIS METHODOLOGY IS >24 mIU/mL    No results found.  Review of Systems  Constitutional: Negative.   HENT: Negative.   Eyes: Negative.   Respiratory: Negative.    Cardiovascular: Negative.   Gastrointestinal: Negative.   Musculoskeletal: Negative.   Skin: Negative.   Neurological: Negative.   Psychiatric/Behavioral: Negative.     Blood pressure 129/52, pulse 80, temperature 96.9 F (36.1 C), resp. rate 18, height 5\' 5"  (1.651 m), weight 103.874 kg (229 lb), last menstrual period 12/08/2015, SpO2 99 %. Physical Exam  Nursing note and vitals reviewed. Constitutional: She appears well-developed and well-nourished.  HENT:  Head: Normocephalic and atraumatic.  Eyes: Conjunctivae are normal. Pupils are equal, round, and reactive to light.  Neck: Normal range of motion.  Cardiovascular: Normal rate, regular rhythm and normal heart sounds.   Respiratory: Effort normal and breath sounds normal. No respiratory distress.  GI: Soft.  Musculoskeletal: Normal range of motion.  Neurological: She is alert.  Skin: Skin is warm and dry.  Psychiatric: She has a normal mood and affect. Her behavior is normal. Judgment and thought content normal.     Assessment/Plan Treatment today with scheduled follow-up for next week. She is also going to be seeing her outpatient psychiatrist in the interim. Continue current diabetes medicine which is going well and she is planning to see her endocrinologist soon as well.  [name], MD 12/15/2015, 10:15 AM

## 2015-12-15 NOTE — Transfer of Care (Signed)
Immediate Anesthesia Transfer of Care Note  Patient: Melinda [name]  Procedure(s) Performed: * No procedures listed *  Patient Location: PACU  Anesthesia Type:General  Level of Consciousness: awake and sedated  Airway & Oxygen Therapy: Patient Spontanous Breathing and Patient connected to face mask oxygen  Post-op Assessment: Report given to RN and Post -op Vital signs reviewed and stable  Post vital signs: Reviewed and stable  Last Vitals:  Filed Vitals:   12/15/15 0814  BP: 129/52  Pulse: 80  Temp: 36.1 C  Resp: 18    Complications: No apparent anesthesia complications

## 2015-12-15 NOTE — Procedures (Signed)
ECT SERVICES Physician's Interval Evaluation & Treatment Note  Patient Identification: Melinda [name] MRN:  [id number] Date of Evaluation:  12/15/2015 TX #: [id number]  MADRS:   MMSE:   P.E. Findings:  No significant physical findings no change. Vitals stable. Heart and lungs normal.  Psychiatric Interval Note:  Mood's been stable no return of suicidal ideation or self-destructive behavior.  Subjective:  Patient is a 45 y.o. female seen for evaluation for Electroconvulsive Therapy. No specific complaints.  Treatment Summary:   []   Right Unilateral             [x]  Bilateral   % Energy : 1.0 ms, 35%   Impedance: 910 ohms  Seizure Energy Index: 2565 V squared  Postictal Suppression Index: 96%  Seizure Concordance Index: 94%  Medications  Pre Shock: Robinul 0.4 mg, Toradol 30 mg, labetalol 20 mg, Brevital 70 mg, esmolol 10 mg, succinylcholine 100 mg  Post Shock: None  Seizure Duration: 37 seconds by EMG, 56 seconds by EEG   Comments: Follow-up one week   Lungs:  [x]   Clear to auscultation               []  Other:   Heart:    [x]   Regular rhythm             []  irregular rhythm    [x]   Previous H&P reviewed, patient examined and there are NO CHANGES                 []   Previous H&P reviewed, patient examined and there are changes noted.   [name], MD 2/3/201710:17 AM

## 2015-12-15 NOTE — OR Nursing (Signed)
Patient returns from pacu in wheelchair with head covered in a blanket.  States she has a headache like usual but does not want anything to treat this.  She refuses food/crackers or drink and states that she eats when she gets home.  Quiet and somewhat retreated under warm blankets but able to answer questions appropriately.

## 2015-12-15 NOTE — Anesthesia Preprocedure Evaluation (Signed)
Anesthesia Evaluation  Patient identified by MRN, date of birth, ID band Patient awake    Reviewed: Allergy & Precautions, H&P , NPO status , Patient's Chart, lab work & pertinent test results  History of Anesthesia Complications Negative for: history of anesthetic complications  Airway Mallampati: IV  TM Distance: >3 FB Neck ROM: Limited    Dental  (+) Poor Dentition   Pulmonary sleep apnea and Continuous Positive Airway Pressure Ventilation ,    Pulmonary exam normal        Cardiovascular Exercise Tolerance: Poor hypertension, Pt. on medications Normal cardiovascular exam     Neuro/Psych PSYCHIATRIC DISORDERS Depression Bipolar Disorder DIabetic neuropathy.  Neuromuscular disease    GI/Hepatic GERD  Medicated and Controlled,  Endo/Other  diabetes, Type 2Morbid obesityBG 77.  Renal/GU      Musculoskeletal   Abdominal (+) + obese,   Peds  Hematology   Anesthesia Other Findings Past Medical History:   Depression                                                   Diabetes mellitus without complication ([place])                 GERD (gastroesophageal reflux disease)                       Hypercholesterolemia                            03/07/15      Hypertension                                                 Obesity                                         03/07/15      Personality disorder                            03/07/15      Sinus tachycardia ([place])                         03/07/15        Comment:history of   Suicidal thoughts                               03/07/15      Diabetic peripheral neuropathy ([place])            03/07/15      Diabetic peripheral neuropathy ([place])            03/07/15      Diabetic peripheral neuropathy ([place])            03/07/15     BMI    Body Mass Index   38.10 kg/m 2      Reproductive/Obstetrics  Anesthesia Physical  Anesthesia Plan  ASA:  III  Anesthesia Plan: General   Post-op Pain Management:    Induction: Intravenous  Airway Management Planned: Mask  Additional Equipment:   Intra-op Plan:   Post-operative Plan:   Informed Consent: I have reviewed the patients History and Physical, chart, labs and discussed the procedure including the risks, benefits and alternatives for the proposed anesthesia with the patient or authorized representative who has indicated his/her understanding and acceptance.     Plan Discussed with:   Anesthesia Plan Comments:         Anesthesia Quick Evaluation

## 2015-12-15 NOTE — Anesthesia Postprocedure Evaluation (Signed)
Anesthesia Post Note  Patient: Melinda [name]  Procedure(s) Performed: * No procedures listed *  Patient location during evaluation: PACU Anesthesia Type: General Level of consciousness: awake and alert Pain management: pain level controlled Vital Signs Assessment: post-procedure vital signs reviewed and stable Respiratory status: spontaneous breathing, nonlabored ventilation, respiratory function stable and patient connected to nasal cannula oxygen Cardiovascular status: blood pressure returned to baseline and stable Postop Assessment: no signs of nausea or vomiting Anesthetic complications: no    Last Vitals:  Filed Vitals:   12/15/15 1105 12/15/15 1114  BP: 107/82 100/46  Pulse: 81 8  Temp:  36.7 C  Resp: 18     Last Pain:  Filed Vitals:   12/15/15 1119  PainSc: [place]

## 2015-12-18 DIAGNOSIS — Z6841 Body Mass Index (BMI) 40.0 and over, adult: Secondary | ICD-10-CM | POA: Diagnosis not present

## 2015-12-18 DIAGNOSIS — E119 Type 2 diabetes mellitus without complications: Secondary | ICD-10-CM | POA: Diagnosis not present

## 2015-12-22 ENCOUNTER — Encounter: Payer: Self-pay | Admitting: Anesthesiology

## 2015-12-22 ENCOUNTER — Encounter
Admission: RE | Admit: 2015-12-22 | Discharge: 2015-12-22 | Disposition: A | Discharge status: Home / Self Care | Payer: Medicare PPO | Source: Ambulatory Visit | Attending: Psychiatry | Admitting: Psychiatry

## 2015-12-22 DIAGNOSIS — E119 Type 2 diabetes mellitus without complications: Secondary | ICD-10-CM | POA: Diagnosis not present

## 2015-12-22 DIAGNOSIS — F332 Major depressive disorder, recurrent severe without psychotic features: Secondary | ICD-10-CM

## 2015-12-22 DIAGNOSIS — I10 Essential (primary) hypertension: Secondary | ICD-10-CM | POA: Diagnosis not present

## 2015-12-22 LAB — GLUCOSE, CAPILLARY: Glucose-Capillary: 95 mg/dL (ref 65–99)

## 2015-12-22 LAB — POCT PREGNANCY, URINE: Preg Test, Ur: NEGATIVE

## 2015-12-22 MED ORDER — KETOROLAC TROMETHAMINE 30 MG/ML IJ SOLN
30.0000 mg | Freq: Once | INTRAMUSCULAR | Status: AC
  Administered 2015-12-22: 30 mg via INTRAVENOUS

## 2015-12-22 MED ORDER — ESMOLOL HCL 100 MG/10ML IV SOLN
INTRAVENOUS | Status: DC | PRN
  Administered 2015-12-22: 10 mg via INTRAVENOUS

## 2015-12-22 MED ORDER — KETOROLAC TROMETHAMINE 30 MG/ML IJ SOLN
INTRAMUSCULAR | Status: AC
  Filled 2015-12-22: qty 1

## 2015-12-22 MED ORDER — SODIUM CHLORIDE 0.9 % IV SOLN
250.0000 mL | Freq: Once | INTRAVENOUS | Status: AC
  Administered 2015-12-22: 250 mL via INTRAVENOUS

## 2015-12-22 MED ORDER — GLYCOPYRROLATE 0.2 MG/ML IJ SOLN
0.4000 mg | Freq: Once | INTRAMUSCULAR | Status: AC
  Administered 2015-12-22: 0.4 mg via INTRAVENOUS

## 2015-12-22 MED ORDER — METHOHEXITAL SODIUM 100 MG/10ML IV SOSY
PREFILLED_SYRINGE | INTRAVENOUS | Status: DC | PRN
  Administered 2015-12-22: 70 mg via INTRAVENOUS

## 2015-12-22 MED ORDER — SUCCINYLCHOLINE 20MG/ML (10ML) SYRINGE FOR MEDFUSION PUMP - OPTIME
INTRAMUSCULAR | Status: DC | PRN
  Administered 2015-12-22: 100 mg via INTRAVENOUS

## 2015-12-22 MED ORDER — SODIUM CHLORIDE 0.9 % IV SOLN
INTRAVENOUS | Status: DC | PRN
  Administered 2015-12-22: 10:00:00 via INTRAVENOUS

## 2015-12-22 MED ORDER — LABETALOL HCL 5 MG/ML IV SOLN
INTRAVENOUS | Status: DC | PRN
  Administered 2015-12-22: 20 mg via INTRAVENOUS

## 2015-12-22 NOTE — Anesthesia Procedure Notes (Signed)
Date/Time: 12/22/2015 10:22 AM Performed by: Dionne Bucy Pre-anesthesia Checklist: Patient identified, Emergency Drugs available, Suction available and Patient being monitored Patient Re-evaluated:Patient Re-evaluated prior to inductionOxygen Delivery Method: Circle system utilized Preoxygenation: Pre-oxygenation with 100% oxygen Intubation Type: IV induction Ventilation: Mask ventilation without difficulty and Mask ventilation throughout procedure Airway Equipment and Method: Bite block Placement Confirmation: positive ETCO2 Dental Injury: Teeth and Oropharynx as per pre-operative assessment

## 2015-12-22 NOTE — Anesthesia Postprocedure Evaluation (Signed)
Anesthesia Post Note  Patient: Melinda [name]  Procedure(s) Performed: * No procedures listed *  Patient location during evaluation: PACU Anesthesia Type: General Level of consciousness: awake and alert Pain management: pain level controlled Vital Signs Assessment: post-procedure vital signs reviewed and stable Respiratory status: spontaneous breathing, nonlabored ventilation, respiratory function stable and patient connected to nasal cannula oxygen Cardiovascular status: blood pressure returned to baseline and stable Postop Assessment: no signs of nausea or vomiting Anesthetic complications: no    Last Vitals:  Filed Vitals:   12/22/15 1053 12/22/15 1103  BP: 119/80 120/65  Pulse: 88 86  Temp:  37.1 C  Resp: 20 15    Last Pain:  Filed Vitals:   12/22/15 1108  PainSc: 0-No pain                 Molli Barrows

## 2015-12-22 NOTE — H&P (Signed)
Melinda [name] is an 45 y.o. female.   Chief Complaint: Patient talked a little bit more today about cutting but has not acted on it. Otherwise no new complaint overall mood seems to be stable. She emphasizes that this is not a consistent thing. HPI: History of chronic severe depression maintained on maintenance ECT with generally good stability  Past Medical History  Diagnosis Date  . Depression   . Diabetes mellitus without complication ([place])   . GERD (gastroesophageal reflux disease)   . Hypercholesterolemia 03/07/15  . Hypertension   . Obesity 03/07/15  . Personality disorder 03/07/15  . Sinus tachycardia ([place]) 03/07/15    history of  . Suicidal thoughts 03/07/15  . Diabetic peripheral neuropathy ([place]) 03/07/15  . Diabetic peripheral neuropathy ([place]) 03/07/15  . Diabetic peripheral neuropathy ([place]) 03/07/15    Past Surgical History  Procedure Laterality Date  . Electroconvulsion therapy  03/07/15    Family History  Problem Relation Age of Onset  . Hypertension Father   . Diabetes Mother    Social History:  reports that she has never smoked. She has never used smokeless tobacco. She reports that she does not drink alcohol or use illicit drugs.  Allergies:  Allergies  Allergen Reactions  . Prednisone     Increases blood sugar     (Not in a hospital admission)  Results for orders placed or performed during the hospital encounter of 12/22/15 (from the past 48 hour(s))  Glucose, capillary     Status: None   Collection Time: 12/22/15  8:09 AM  Result Value Ref Range   Glucose-Capillary 95 65 - 99 mg/dL   Comment 1 Notify RN   Pregnancy, urine POC     Status: None   Collection Time: 12/22/15  8:12 AM  Result Value Ref Range   Preg Test, Ur NEGATIVE NEGATIVE    Comment:        THE SENSITIVITY OF THIS METHODOLOGY IS >24 mIU/mL    No results found.  Review of Systems  Constitutional: Negative.   HENT: Negative.   Eyes: Negative.   Respiratory: Negative.    Cardiovascular: Negative.   Gastrointestinal: Negative.   Musculoskeletal: Negative.   Skin: Negative.   Neurological: Negative.   Psychiatric/Behavioral: Negative for depression, suicidal ideas, hallucinations, memory loss and substance abuse. The patient is not nervous/anxious and does not have insomnia.     Blood pressure 121/84, pulse 76, temperature 98 F (36.7 C), temperature source Oral, resp. rate 1, weight 102.513 kg (226 lb), last menstrual period 12/08/2015, SpO2 100 %. Physical Exam  Nursing note and vitals reviewed. Constitutional: She appears well-developed and well-nourished.  HENT:  Head: Normocephalic and atraumatic.  Eyes: Conjunctivae are normal. Pupils are equal, round, and reactive to light.  Neck: Normal range of motion.  Cardiovascular: Normal rate, regular rhythm and normal heart sounds.   Respiratory: Effort normal and breath sounds normal. No respiratory distress.  GI: Soft.  Musculoskeletal: Normal range of motion.  Neurological: She is alert.  Skin: Skin is warm and dry.  Psychiatric: She has a normal mood and affect. Her behavior is normal. Judgment and thought content normal.     Assessment/Plan Follow-up ECT treatment weekly next to be seen next Friday.  [name], MD 12/22/2015, 10:15 AM

## 2015-12-22 NOTE — Anesthesia Preprocedure Evaluation (Signed)
Anesthesia Evaluation  Patient identified by MRN, date of birth, ID band Patient awake    Reviewed: Allergy & Precautions, H&P , NPO status , Patient's Chart, lab work & pertinent test results, reviewed documented beta blocker date and time   Airway Mallampati: II   Neck ROM: full    Dental  (+) Poor Dentition   Pulmonary neg pulmonary ROS, sleep apnea ,    Pulmonary exam normal        Cardiovascular hypertension, negative cardio ROS Normal cardiovascular exam     Neuro/Psych PSYCHIATRIC DISORDERS  Neuromuscular disease negative neurological ROS  negative psych ROS   GI/Hepatic negative GI ROS, Neg liver ROS, GERD  ,  Endo/Other  negative endocrine ROSdiabetes  Renal/GU negative Renal ROS  negative genitourinary   Musculoskeletal   Abdominal   Peds  Hematology negative hematology ROS (+)   Anesthesia Other Findings Past Medical History:   Depression                                                   Diabetes mellitus without complication ([place])                 GERD (gastroesophageal reflux disease)                       Hypercholesterolemia                            03/07/15      Hypertension                                                 Obesity                                         03/07/15      Personality disorder                            03/07/15      Sinus tachycardia ([place])                         03/07/15        Comment:history of   Suicidal thoughts                               03/07/15      Diabetic peripheral neuropathy ([place])            03/07/15      Diabetic peripheral neuropathy ([place])            03/07/15      Diabetic [place] ([place]            03/07/15    Past Surgical History:   electroconvulsion therapy                        03/07/15    BMI    Body Mass Index  37.60 kg/m 2     Reproductive/Obstetrics                             Anesthesia  Physical Anesthesia Plan  ASA: III  Anesthesia Plan: General   Post-op Pain Management:    Induction:   Airway Management Planned:   Additional Equipment:   Intra-op Plan:   Post-operative Plan:   Informed Consent: I have reviewed the patients History and Physical, chart, labs and discussed the procedure including the risks, benefits and alternatives for the proposed anesthesia with the patient or authorized representative who has indicated his/her understanding and acceptance.   Dental Advisory Given  Plan Discussed with: CRNA  Anesthesia Plan Comments:         Anesthesia Quick Evaluation

## 2015-12-22 NOTE — Procedures (Signed)
ECT SERVICES Physician's Interval Evaluation & Treatment Note  Patient Identification: Melinda [name] MRN:  [id number] Date of Evaluation:  12/22/2015 TX #: 213  MADRS: 13  MMSE: 25  P.E. Findings:  No new physical finding lungs clear heart normal no new skin lesions  Psychiatric Interval Note:  Mood generally stable no major shift  Subjective:  Patient is a 45 y.o. female seen for evaluation for Electroconvulsive Therapy. Talks a little bit about cutting but has not acted on it.  Treatment Summary:   []   Right Unilateral             [x]  Bilateral   % Energy : 1.0 ms 35%   Impedance: 1390 ohms  Seizure Energy Index: The machine did not come up with a reading  Postictal Suppression Index: We also did not get a reading for this  Seizure Concordance Index: 16% but that's clearly not correct  Medications  Pre Shock: Robinul 0.4 mg, labetalol 20 mg, Toradol 30 mg, esmolol 10 mg, Brevital 70 mg, succinylcholine 100 mg  Post Shock:    Seizure Duration: 38 seconds by EMG, 47 seconds by EEG   Comments: There was some loosening of the monitoring of electrodes during the seizure which disrupted some of the readings nevertheless it was clear that she had a perfectly good seizure. The 47 seconds by my visual reading of the EEG. Follow-up one week.   Lungs:  [x]   Clear to auscultation               []  Other:   Heart:    [x]   Regular rhythm             []  irregular rhythm    [x]   Previous H&P reviewed, patient examined and there are NO CHANGES                 []   Previous H&P reviewed, patient examined and there are changes noted.   [name], MD 2/10/201710:17 AM

## 2015-12-22 NOTE — Transfer of Care (Signed)
Immediate Anesthesia Transfer of Care Note  Patient: Melinda [name]  Procedure(s) Performed: ECT  Patient Location: PACU  Anesthesia Type:General  Level of Consciousness: sedated  Airway & Oxygen Therapy: Patient Spontanous Breathing and Patient connected to face mask oxygen  Post-op Assessment: Report given to RN  Post vital signs: Reviewed  Last Vitals:  Filed Vitals:   12/22/15 0823 12/22/15 1033  BP: 121/84 117/72  Pulse: 76 90  Temp: 36.7 C 99.3  Resp: 1 22    Complications: No apparent anesthesia complications3

## 2015-12-25 DIAGNOSIS — F330 Major depressive disorder, recurrent, mild: Secondary | ICD-10-CM | POA: Diagnosis not present

## 2015-12-29 ENCOUNTER — Encounter (HOSPITAL_BASED_OUTPATIENT_CLINIC_OR_DEPARTMENT_OTHER)
Admission: RE | Admit: 2015-12-29 | Discharge: 2015-12-29 | Disposition: A | Discharge status: Home / Self Care | Payer: Medicare PPO | Source: Ambulatory Visit | Attending: Psychiatry | Admitting: Psychiatry

## 2015-12-29 ENCOUNTER — Encounter: Payer: Self-pay | Admitting: Anesthesiology

## 2015-12-29 DIAGNOSIS — F332 Major depressive disorder, recurrent severe without psychotic features: Secondary | ICD-10-CM

## 2015-12-29 DIAGNOSIS — I10 Essential (primary) hypertension: Secondary | ICD-10-CM | POA: Diagnosis not present

## 2015-12-29 DIAGNOSIS — E119 Type 2 diabetes mellitus without complications: Secondary | ICD-10-CM | POA: Diagnosis not present

## 2015-12-29 LAB — POCT PREGNANCY, URINE: Preg Test, Ur: NEGATIVE

## 2015-12-29 LAB — GLUCOSE, CAPILLARY: Glucose-Capillary: 91 mg/dL (ref 65–99)

## 2015-12-29 MED ORDER — GLYCOPYRROLATE 0.2 MG/ML IJ SOLN
0.4000 mg | Freq: Once | INTRAMUSCULAR | Status: AC
  Administered 2015-12-29: 0.4 mg via INTRAVENOUS

## 2015-12-29 MED ORDER — SUCCINYLCHOLINE CHLORIDE 20 MG/ML IJ SOLN
INTRAMUSCULAR | Status: DC | PRN
  Administered 2015-12-29: 100 mg via INTRAVENOUS

## 2015-12-29 MED ORDER — KETOROLAC TROMETHAMINE 30 MG/ML IJ SOLN
30.0000 mg | Freq: Once | INTRAMUSCULAR | Status: AC
  Administered 2015-12-29: 30 mg via INTRAVENOUS

## 2015-12-29 MED ORDER — ESMOLOL HCL 100 MG/10ML IV SOLN
INTRAVENOUS | Status: DC | PRN
  Administered 2015-12-29: 10 mg via INTRAVENOUS

## 2015-12-29 MED ORDER — LABETALOL HCL 5 MG/ML IV SOLN
INTRAVENOUS | Status: DC | PRN
  Administered 2015-12-29: 20 mg via INTRAVENOUS

## 2015-12-29 MED ORDER — SODIUM CHLORIDE 0.9 % IV SOLN
INTRAVENOUS | Status: DC | PRN
  Administered 2015-12-29: 10:00:00 via INTRAVENOUS

## 2015-12-29 MED ORDER — METHOHEXITAL SODIUM 100 MG/10ML IV SOSY
PREFILLED_SYRINGE | INTRAVENOUS | Status: DC | PRN
  Administered 2015-12-29: 70 mg via INTRAVENOUS

## 2015-12-29 MED ORDER — SODIUM CHLORIDE 0.9 % IV SOLN
250.0000 mL | Freq: Once | INTRAVENOUS | Status: AC
  Administered 2015-12-29: 500 mL via INTRAVENOUS

## 2015-12-29 NOTE — H&P (Signed)
Melinda [name] is an 45 y.o. female.   Chief Complaint: No new complaint. Mood is been stable. No new physical complaint. HPI: Patient receiving maintenance ECT treatment for depression. No return of depression. Blood pressure stable. Blood sugar stable.  Past Medical History  Diagnosis Date  . Depression   . Diabetes mellitus without complication ([place])   . GERD (gastroesophageal reflux disease)   . Hypercholesterolemia 03/07/15  . Hypertension   . Obesity 03/07/15  . Personality disorder 03/07/15  . Sinus tachycardia ([place]) 03/07/15    history of  . Suicidal thoughts 03/07/15  . Diabetic peripheral neuropathy ([place]) 03/07/15  . Diabetic peripheral neuropathy ([place]) 03/07/15  . Diabetic peripheral neuropathy ([place]) 03/07/15    Past Surgical History  Procedure Laterality Date  . Electroconvulsion therapy  03/07/15    Family History  Problem Relation Age of Onset  . Hypertension Father   . Diabetes Mother    Social History:  reports that she has never smoked. She has never used smokeless tobacco. She reports that she does not drink alcohol or use illicit drugs.  Allergies:  Allergies  Allergen Reactions  . Prednisone     Increases blood sugar     (Not in a hospital admission)  Results for orders placed or performed during the hospital encounter of 12/29/15 (from the past 48 hour(s))  Pregnancy, urine POC     Status: None   Collection Time: 12/29/15  8:16 AM  Result Value Ref Range   Preg Test, Ur NEGATIVE NEGATIVE    Comment:        THE SENSITIVITY OF THIS METHODOLOGY IS >24 mIU/mL   Glucose, capillary     Status: None   Collection Time: 12/29/15  8:19 AM  Result Value Ref Range   Glucose-Capillary 91 65 - 99 mg/dL   No results found.  Review of Systems  Constitutional: Negative.   HENT: Negative.   Eyes: Negative.   Respiratory: Negative.   Cardiovascular: Negative.   Gastrointestinal: Negative.   Musculoskeletal: Negative.   Skin: Negative.   Neurological:  Negative.   Psychiatric/Behavioral: Negative for depression, suicidal ideas, hallucinations, memory loss and substance abuse. The patient is not nervous/anxious and does not have insomnia.     Blood pressure 109/73, pulse 98, temperature 96.7 F (35.9 C), temperature source Oral, resp. rate 18, height 5\' 5"  (1.651 m), weight 101.606 kg (224 lb), last menstrual period 12/08/2015, SpO2 100 %. Physical Exam  Nursing note and vitals reviewed. Constitutional: She appears well-developed and well-nourished.  HENT:  Head: Normocephalic and atraumatic.  Eyes: Conjunctivae are normal. Pupils are equal, round, and reactive to light.  Neck: Normal range of motion.  Cardiovascular: Normal rate, regular rhythm and normal heart sounds.   Respiratory: Effort normal and breath sounds normal. No respiratory distress.  GI: Soft.  Musculoskeletal: Normal range of motion.  Neurological: She is alert.  Skin: Skin is warm and dry.  Psychiatric: She has a normal mood and affect. Her behavior is normal. Judgment and thought content normal.     Assessment/Plan Treatment today with follow-up in 1 week supportive counseling completed medicines reviewed no change to medicine.  [name], MD 12/29/2015, 10:12 AM

## 2015-12-29 NOTE — Procedures (Signed)
ECT SERVICES Physician's Interval Evaluation & Treatment Note  Patient Identification: Melinda [name] MRN:  [id number] Date of Evaluation:  12/29/2015 TX #: [id number]  MADRS:   MMSE:   P.E. Findings:  No change to physical exam. Sugars and blood pressure under good control. No complaints. Lungs and heart normal  Psychiatric Interval Note:  Mood is baseline no worsening of depression no suicidal behavior  Subjective:  Patient is a 45 y.o. female seen for evaluation for Electroconvulsive Therapy. No specific complaint  Treatment Summary:   []   Right Unilateral             [x]  Bilateral   % Energy : 1.0 ms 35%   Impedance: 1190 ohms  Seizure Energy Index: 1836 V squared  Postictal Suppression Index: 84%  Seizure Concordance Index: 88%  Medications  Pre Shock: Robinul 0.4 mg, Toradol 30 mg, labetalol 20 mg, esmolol 10 mg, Brevital 70 mg, succinylcholine 100 mg  Post Shock:    Seizure Duration: 32 seconds by EMG, 47 seconds by EEG   Comments: Follow-up in one week on February 24   Lungs:  [x]   Clear to auscultation               []  Other:   Heart:    [x]   Regular rhythm             []  irregular rhythm    [x]   Previous H&P reviewed, patient examined and there are NO CHANGES                 []   Previous H&P reviewed, patient examined and there are changes noted.   [name], MD 2/17/201710:14 AM

## 2015-12-29 NOTE — Anesthesia Preprocedure Evaluation (Signed)
Anesthesia Evaluation  Patient identified by MRN, date of birth, ID band Patient awake    Reviewed: Allergy & Precautions, H&P , NPO status , Patient's Chart, lab work & pertinent test results, reviewed documented beta blocker date and time   History of Anesthesia Complications Negative for: history of anesthetic complications  Airway Mallampati: II   Neck ROM: full    Dental  (+) Poor Dentition   Pulmonary sleep apnea , Recent URI ,    Pulmonary exam normal        Cardiovascular hypertension, Normal cardiovascular exam     Neuro/Psych PSYCHIATRIC DISORDERS  Neuromuscular disease negative neurological ROS  negative psych ROS   GI/Hepatic negative GI ROS, Neg liver ROS, GERD  ,  Endo/Other  negative endocrine ROSdiabetes  Renal/GU negative Renal ROS  negative genitourinary   Musculoskeletal   Abdominal   Peds  Hematology negative hematology ROS (+)   Anesthesia Other Findings Past Medical History:   Depression                                                   Diabetes mellitus without complication ([place])                 GERD (gastroesophageal reflux disease)                       Hypercholesterolemia                            03/07/15      Hypertension                                                 Obesity                                         03/07/15      Personality disorder                            03/07/15      Sinus tachycardia ([place])                         03/07/15        Comment:history of   Suicidal thoughts                               03/07/15      Diabetic peripheral neuropathy ([place])            03/07/15      Diabetic peripheral neuropathy ([place])            03/07/15      Diabetic peripheral neuropathy ([place])            03/07/15    Past Surgical History:   electroconvulsion therapy                        03/07/15  BMI    Body Mass Index   37.60 kg/m 2     Reproductive/Obstetrics                              Anesthesia Physical  Anesthesia Plan  ASA: III  Anesthesia Plan: General   Post-op Pain Management:    Induction:   Airway Management Planned:   Additional Equipment:   Intra-op Plan:   Post-operative Plan:   Informed Consent: I have reviewed the patients History and Physical, chart, labs and discussed the procedure including the risks, benefits and alternatives for the proposed anesthesia with the patient or authorized representative who has indicated his/her understanding and acceptance.   Dental Advisory Given  Plan Discussed with: CRNA  Anesthesia Plan Comments:         Anesthesia Quick Evaluation

## 2015-12-29 NOTE — Transfer of Care (Signed)
Immediate Anesthesia Transfer of Care Note  Patient: Melinda [name]  Procedure(s) Performed: * No procedures listed *  Patient Location: PACU  Anesthesia Type:General  Level of Consciousness: oriented and sedated  Airway & Oxygen Therapy: Patient Spontanous Breathing and Patient connected to nasal cannula oxygen  Post-op Assessment: Report given to RN and Post -op Vital signs reviewed and stable  Post vital signs: Reviewed and stable  Last Vitals:  Filed Vitals:   12/29/15 0819 12/29/15 1029  BP: 109/73 117/48  Pulse: 98 98  Temp:  37.4 C  Resp: 18 16    Complications: No apparent anesthesia complications

## 2015-12-29 NOTE — Anesthesia Postprocedure Evaluation (Signed)
Anesthesia Post Note  Patient: Melinda [name]  Procedure(s) Performed: * No procedures listed *  Patient location during evaluation: PACU Anesthesia Type: General Level of consciousness: awake and alert Pain management: pain level controlled Vital Signs Assessment: post-procedure vital signs reviewed and stable Respiratory status: spontaneous breathing, nonlabored ventilation, respiratory function stable and patient connected to nasal cannula oxygen Cardiovascular status: blood pressure returned to baseline and stable Postop Assessment: no signs of nausea or vomiting Anesthetic complications: no    Last Vitals:  Filed Vitals:   12/29/15 1100 12/29/15 1108  BP: 127/95 109/82  Pulse: 89 88  Temp: 36.8 C   Resp: 29 18    Last Pain: There were no vitals filed for this visit.               Martha Clan

## 2016-01-05 ENCOUNTER — Encounter: Payer: Self-pay | Admitting: Certified Registered Nurse Anesthetist

## 2016-01-05 ENCOUNTER — Encounter
Admission: RE | Admit: 2016-01-05 | Discharge: 2016-01-05 | Disposition: A | Discharge status: Home / Self Care | Payer: Medicare PPO | Source: Ambulatory Visit | Attending: Psychiatry | Admitting: Psychiatry

## 2016-01-05 DIAGNOSIS — K219 Gastro-esophageal reflux disease without esophagitis: Secondary | ICD-10-CM | POA: Insufficient documentation

## 2016-01-05 DIAGNOSIS — E669 Obesity, unspecified: Secondary | ICD-10-CM | POA: Diagnosis not present

## 2016-01-05 DIAGNOSIS — Z888 Allergy status to other drugs, medicaments and biological substances status: Secondary | ICD-10-CM | POA: Insufficient documentation

## 2016-01-05 DIAGNOSIS — E1142 Type 2 diabetes mellitus with diabetic polyneuropathy: Secondary | ICD-10-CM | POA: Diagnosis not present

## 2016-01-05 DIAGNOSIS — F332 Major depressive disorder, recurrent severe without psychotic features: Secondary | ICD-10-CM | POA: Diagnosis not present

## 2016-01-05 DIAGNOSIS — E119 Type 2 diabetes mellitus without complications: Secondary | ICD-10-CM | POA: Diagnosis not present

## 2016-01-05 DIAGNOSIS — I10 Essential (primary) hypertension: Secondary | ICD-10-CM | POA: Diagnosis not present

## 2016-01-05 DIAGNOSIS — E7800 Pure hypercholesterolemia, unspecified: Secondary | ICD-10-CM | POA: Insufficient documentation

## 2016-01-05 DIAGNOSIS — Z3202 Encounter for pregnancy test, result negative: Secondary | ICD-10-CM | POA: Diagnosis not present

## 2016-01-05 LAB — POCT PREGNANCY, URINE: Preg Test, Ur: NEGATIVE

## 2016-01-05 LAB — GLUCOSE, CAPILLARY: Glucose-Capillary: 84 mg/dL (ref 65–99)

## 2016-01-05 MED ORDER — ONDANSETRON HCL 4 MG/2ML IJ SOLN: 4.0000 mg | Freq: Once | INTRAMUSCULAR | Status: DC | PRN

## 2016-01-05 MED ORDER — LABETALOL HCL 5 MG/ML IV SOLN
INTRAVENOUS | Status: DC | PRN
  Administered 2016-01-05: 20 mg via INTRAVENOUS

## 2016-01-05 MED ORDER — FENTANYL CITRATE (PF) 100 MCG/2ML IJ SOLN: 25.0000 ug | INTRAMUSCULAR | Status: DC | PRN

## 2016-01-05 MED ORDER — GLYCOPYRROLATE 0.2 MG/ML IJ SOLN
0.4000 mg | Freq: Once | INTRAMUSCULAR | Status: AC
  Administered 2016-01-05: 0.4 mg via INTRAVENOUS

## 2016-01-05 MED ORDER — SUCCINYLCHOLINE CHLORIDE 20 MG/ML IJ SOLN
INTRAMUSCULAR | Status: DC | PRN
  Administered 2016-01-05: 100 mg via INTRAVENOUS

## 2016-01-05 MED ORDER — METHOHEXITAL SODIUM 100 MG/10ML IV SOSY
PREFILLED_SYRINGE | INTRAVENOUS | Status: DC | PRN
  Administered 2016-01-05: 70 mg via INTRAVENOUS

## 2016-01-05 MED ORDER — KETOROLAC TROMETHAMINE 30 MG/ML IJ SOLN
30.0000 mg | Freq: Once | INTRAMUSCULAR | Status: AC
  Administered 2016-01-05: 30 mg via INTRAVENOUS

## 2016-01-05 MED ORDER — ESMOLOL HCL 100 MG/10ML IV SOLN
INTRAVENOUS | Status: DC | PRN
  Administered 2016-01-05: 10 mg via INTRAVENOUS

## 2016-01-05 MED ORDER — SODIUM CHLORIDE 0.9 % IV SOLN
INTRAVENOUS | Status: DC | PRN
  Administered 2016-01-05: 10:00:00 via INTRAVENOUS

## 2016-01-05 MED ORDER — SODIUM CHLORIDE 0.9 % IV SOLN
250.0000 mL | Freq: Once | INTRAVENOUS | Status: AC
  Administered 2016-01-05: 500 mL via INTRAVENOUS

## 2016-01-05 NOTE — Anesthesia Postprocedure Evaluation (Signed)
Anesthesia Post Note  Patient: Melinda [name]  Procedure(s) Performed: * No procedures listed *  Patient location during evaluation: PACU Anesthesia Type: General Level of consciousness: awake and alert and oriented Pain management: pain level controlled Vital Signs Assessment: post-procedure vital signs reviewed and stable Respiratory status: spontaneous breathing Cardiovascular status: blood pressure returned to baseline Anesthetic complications: no    Last Vitals:  Filed Vitals:   01/05/16 1135 01/05/16 1147  BP: 120/64 124/89  Pulse: 92 85  Temp:    Resp: 21 18    Last Pain:  Filed Vitals:   01/05/16 1151  PainSc: 3                  Alejandra Hunt

## 2016-01-05 NOTE — H&P (Signed)
Melinda [name] is an 45 y.o. female.   Chief Complaint: Frustration related to her son but nothing out of the ordinary. Does not report any return of suicidal ideation. Affect is appropriate. No new physical complaints. HPI: Patient with recurrent major depression responds well to routine ECT. Blood pressure stable blood sugars stable. Physically doing well.  Past Medical History  Diagnosis Date  . Depression   . Diabetes mellitus without complication ([place])   . GERD (gastroesophageal reflux disease)   . Hypercholesterolemia 03/07/15  . Hypertension   . Obesity 03/07/15  . Personality disorder 03/07/15  . Sinus tachycardia ([place]) 03/07/15    history of  . Suicidal thoughts 03/07/15  . Diabetic peripheral neuropathy ([place]) 03/07/15  . Diabetic peripheral neuropathy ([place]) 03/07/15  . Diabetic peripheral neuropathy ([place]) 03/07/15    Past Surgical History  Procedure Laterality Date  . Electroconvulsion therapy  03/07/15    Family History  Problem Relation Age of Onset  . Hypertension Father   . Diabetes Mother    Social History:  reports that she has never smoked. She has never used smokeless tobacco. She reports that she does not drink alcohol or use illicit drugs.  Allergies:  Allergies  Allergen Reactions  . Prednisone     Increases blood sugar     (Not in a hospital admission)  Results for orders placed or performed during the hospital encounter of 01/05/16 (from the past 48 hour(s))  Pregnancy, urine POC     Status: None   Collection Time: 01/05/16  8:45 AM  Result Value Ref Range   Preg Test, Ur NEGATIVE NEGATIVE    Comment:        THE SENSITIVITY OF THIS METHODOLOGY IS >24 mIU/mL   Glucose, capillary     Status: None   Collection Time: 01/05/16  8:56 AM  Result Value Ref Range   Glucose-Capillary 84 65 - 99 mg/dL   Comment 1 Notify RN    No results found.  Review of Systems  Constitutional: Negative.   HENT: Negative.   Eyes: Negative.   Respiratory: Negative.    Cardiovascular: Negative.   Gastrointestinal: Negative.   Musculoskeletal: Negative.   Skin: Negative.   Neurological: Negative.   Psychiatric/Behavioral: Positive for depression. Negative for suicidal ideas, hallucinations, memory loss and substance abuse. The patient is not nervous/anxious and does not have insomnia.     Blood pressure 126/72, pulse 85, temperature 96.2 F (35.7 C), resp. rate 18, height 5\' 5"  (1.651 m), weight 102.059 kg (225 lb), last menstrual period 12/08/2015, SpO2 98 %. Physical Exam  Nursing note and vitals reviewed. Constitutional: She appears well-developed and well-nourished.  HENT:  Head: Normocephalic and atraumatic.  Eyes: Conjunctivae are normal. Pupils are equal, round, and reactive to light.  Neck: Normal range of motion.  Cardiovascular: Normal rate, regular rhythm and normal heart sounds.   Respiratory: Effort normal and breath sounds normal.  GI: Soft.  Musculoskeletal: Normal range of motion.  Neurological: She is alert.  Skin: Skin is warm and dry.  Psychiatric: She has a normal mood and affect. Her behavior is normal. Judgment and thought content normal.     Assessment/Plan ECT today follow-up one week patient agreeable to the plan. No change to medicine  [name], MD 01/05/2016, 10:38 AM

## 2016-01-05 NOTE — Anesthesia Preprocedure Evaluation (Signed)
Anesthesia Evaluation  Patient identified by MRN, date of birth, ID band Patient awake    Reviewed: Allergy & Precautions, H&P , NPO status , Patient's Chart, lab work & pertinent test results, reviewed documented beta blocker date and time   History of Anesthesia Complications Negative for: history of anesthetic complications  Airway Mallampati: II   Neck ROM: full    Dental  (+) Poor Dentition   Pulmonary sleep apnea , Recent URI , Resolved,    Pulmonary exam normal        Cardiovascular hypertension, Pt. on medications Normal cardiovascular exam     Neuro/Psych PSYCHIATRIC DISORDERS Depression Bipolar Disorder Peripheral neuropathy  Neuromuscular disease negative neurological ROS  negative psych ROS   GI/Hepatic negative GI ROS, Neg liver ROS, GERD  Medicated and Controlled,  Endo/Other  negative endocrine ROSdiabetes, Well Controlled, Type 2, Oral Hypoglycemic Agents  Renal/GU negative Renal ROS  negative genitourinary   Musculoskeletal   Abdominal (+) + obese,   Peds negative pediatric ROS (+)  Hematology negative hematology ROS (+)   Anesthesia Other Findings Past Medical History:   Depression                                                   Diabetes mellitus without complication ([place])                 GERD (gastroesophageal reflux disease)                       Hypercholesterolemia                            03/07/15      Hypertension                                                 Obesity                                         03/07/15      Personality disorder                            03/07/15      Sinus tachycardia ([place])                         03/07/15        Comment:history of   Suicidal thoughts                               03/07/15      Diabetic peripheral neuropathy ([place])            03/07/15      Diabetic peripheral neuropathy ([place])            03/07/15      Diabetic peripheral neuropathy ([place])             03/07/15    Past Surgical History:   electroconvulsion therapy  03/07/15    BMI    Body Mass Index   37.60 kg/m 2     Reproductive/Obstetrics                             Anesthesia Physical  Anesthesia Plan  ASA: III  Anesthesia Plan: General   Post-op Pain Management:    Induction: Intravenous  Airway Management Planned: Mask  Additional Equipment:   Intra-op Plan:   Post-operative Plan:   Informed Consent: I have reviewed the patients History and Physical, chart, labs and discussed the procedure including the risks, benefits and alternatives for the proposed anesthesia with the patient or authorized representative who has indicated his/her understanding and acceptance.   Dental Advisory Given  Plan Discussed with: CRNA  Anesthesia Plan Comments:         Anesthesia Quick Evaluation

## 2016-01-05 NOTE — Transfer of Care (Signed)
Immediate Anesthesia Transfer of Care Note  Patient: Melinda [name]  Procedure(s) Performed: * No procedures listed *  Patient Location: PACU  Anesthesia Type:General  Level of Consciousness: sedated  Airway & Oxygen Therapy: Patient Spontanous Breathing  Post-op Assessment: Report given to RN and Post -op Vital signs reviewed and stable  Post vital signs: Reviewed and stable  Last Vitals:  Filed Vitals:   01/05/16 0846  BP: 126/72  Pulse: 85  Temp: 35.7 C  Resp: 18    Complications: No apparent anesthesia complications

## 2016-01-05 NOTE — Procedures (Signed)
ECT SERVICES Physician's Interval Evaluation & Treatment Note  Patient Identification: Melinda [name] MRN:  [id number] Date of Evaluation:  01/05/2016 TX #: [id number]  MADRS:   MMSE:   P.E. Findings:  No change to physical exam. Heart and lungs normal.  Psychiatric Interval Note:  Mood stable. Usual ups and downs but smiling not talking about suicide.  Subjective:  Patient is a 45 y.o. female seen for evaluation for Electroconvulsive Therapy. No specific new complaint  Treatment Summary:   []   Right Unilateral             [x]  Bilateral   % Energy : 1.0 ms 35%   Impedance: 910 ohms  Seizure Energy Index: 2959 V squared  Postictal Suppression Index: 91%  Seizure Concordance Index: 94%  Medications  Pre Shock: Robinul 0.4 mg, labetalol 20 mg, Toradol 30 mg, Brevital 70 mg, esmolol 10 mg, succinylcholine 100 mg  Post Shock:    Seizure Duration: 41 seconds by EMG, 78 seconds by EEG   Comments: Follow-up one week.   Lungs:  [x]   Clear to auscultation               []  Other:   Heart:    [x]   Regular rhythm             []  irregular rhythm    [x]   Previous H&P reviewed, patient examined and there are NO CHANGES                 []   Previous H&P reviewed, patient examined and there are changes noted.   [name], MD 2/24/201710:39 AM

## 2016-01-05 NOTE — Anesthesia Procedure Notes (Signed)
Date/Time: 01/05/2016 10:37 AM Performed by: Johnna Acosta Pre-anesthesia Checklist: Patient identified, Emergency Drugs available, Suction available, Patient being monitored and Timeout performed Patient Re-evaluated:Patient Re-evaluated prior to inductionOxygen Delivery Method: Circle system utilized Preoxygenation: Pre-oxygenation with 100% oxygen Intubation Type: IV induction Ventilation: Mask ventilation without difficulty

## 2016-01-12 ENCOUNTER — Encounter: Payer: Self-pay | Admitting: Anesthesiology

## 2016-01-12 ENCOUNTER — Encounter
Admission: RE | Admit: 2016-01-12 | Discharge: 2016-01-12 | Disposition: A | Discharge status: Home / Self Care | Payer: Medicare PPO | Source: Ambulatory Visit | Attending: Psychiatry | Admitting: Psychiatry

## 2016-01-12 DIAGNOSIS — K219 Gastro-esophageal reflux disease without esophagitis: Secondary | ICD-10-CM | POA: Diagnosis not present

## 2016-01-12 DIAGNOSIS — E119 Type 2 diabetes mellitus without complications: Secondary | ICD-10-CM | POA: Insufficient documentation

## 2016-01-12 DIAGNOSIS — I10 Essential (primary) hypertension: Secondary | ICD-10-CM | POA: Insufficient documentation

## 2016-01-12 DIAGNOSIS — F332 Major depressive disorder, recurrent severe without psychotic features: Secondary | ICD-10-CM | POA: Diagnosis not present

## 2016-01-12 DIAGNOSIS — E7800 Pure hypercholesterolemia, unspecified: Secondary | ICD-10-CM | POA: Diagnosis not present

## 2016-01-12 DIAGNOSIS — E669 Obesity, unspecified: Secondary | ICD-10-CM | POA: Insufficient documentation

## 2016-01-12 LAB — POCT PREGNANCY, URINE: Preg Test, Ur: NEGATIVE

## 2016-01-12 LAB — GLUCOSE, CAPILLARY: Glucose-Capillary: 75 mg/dL (ref 65–99)

## 2016-01-12 MED ORDER — SODIUM CHLORIDE 0.9 % IV SOLN
INTRAVENOUS | Status: DC | PRN
  Administered 2016-01-12: 10:00:00 via INTRAVENOUS

## 2016-01-12 MED ORDER — SUCCINYLCHOLINE 20MG/ML (10ML) SYRINGE FOR MEDFUSION PUMP - OPTIME
INTRAMUSCULAR | Status: DC | PRN
  Administered 2016-01-12: 100 mg via INTRAVENOUS

## 2016-01-12 MED ORDER — SODIUM CHLORIDE 0.9 % IV SOLN
250.0000 mL | Freq: Once | INTRAVENOUS | Status: AC
  Administered 2016-01-12: 500 mL via INTRAVENOUS

## 2016-01-12 MED ORDER — METHOHEXITAL SODIUM 100 MG/10ML IV SOSY
PREFILLED_SYRINGE | INTRAVENOUS | Status: DC | PRN
  Administered 2016-01-12: 70 mg via INTRAVENOUS

## 2016-01-12 MED ORDER — KETOROLAC TROMETHAMINE 30 MG/ML IJ SOLN
30.0000 mg | Freq: Once | INTRAMUSCULAR | Status: AC
  Administered 2016-01-12: 30 mg via INTRAVENOUS

## 2016-01-12 MED ORDER — LABETALOL HCL 5 MG/ML IV SOLN
INTRAVENOUS | Status: DC | PRN
  Administered 2016-01-12: 20 mg via INTRAVENOUS

## 2016-01-12 MED ORDER — ESMOLOL HCL 100 MG/10ML IV SOLN
INTRAVENOUS | Status: DC | PRN
  Administered 2016-01-12: 10 mg via INTRAVENOUS

## 2016-01-12 MED ORDER — GLYCOPYRROLATE 0.2 MG/ML IJ SOLN
0.4000 mg | Freq: Once | INTRAMUSCULAR | Status: AC
  Administered 2016-01-12: 0.4 mg via INTRAVENOUS

## 2016-01-12 NOTE — Transfer of Care (Signed)
Immediate Anesthesia Transfer of Care Note  Patient: Melinda [name]  Procedure(s) Performed: ECT  Patient Location: PACU  Anesthesia Type:General  Level of Consciousness: awake and patient cooperative  Airway & Oxygen Therapy: Patient Spontanous Breathing and Patient connected to face mask oxygen  Post-op Assessment: Report given to RN  Post vital signs: Reviewed and stable  Last Vitals:  Filed Vitals:   01/12/16 0810 01/12/16 1031  BP: 115/62 128/90  Pulse: 81 88  Temp: 37 C 36.5 C  Resp: 18 20    Complications: No apparent anesthesia complications

## 2016-01-12 NOTE — H&P (Signed)
Melinda [name] is an 45 y.o. female.   Chief Complaint: No new complaints today. Mood stable no suicidal ideation HPI: Patient has a history of recurrent severe depression. Tolerating medication well tolerating ECT well.  Past Medical History  Diagnosis Date  . Depression   . Diabetes mellitus without complication ([place])   . GERD (gastroesophageal reflux disease)   . Hypercholesterolemia 03/07/15  . Hypertension   . Obesity 03/07/15  . Personality disorder 03/07/15  . Sinus tachycardia ([place]) 03/07/15    history of  . Suicidal thoughts 03/07/15  . Diabetic peripheral neuropathy ([place]) 03/07/15  . Diabetic peripheral neuropathy ([place]) 03/07/15  . Diabetic peripheral neuropathy ([place]) 03/07/15    Past Surgical History  Procedure Laterality Date  . Electroconvulsion therapy  03/07/15    Family History  Problem Relation Age of Onset  . Hypertension Father   . Diabetes Mother    Social History:  reports that she has never smoked. She has never used smokeless tobacco. She reports that she does not drink alcohol or use illicit drugs.  Allergies:  Allergies  Allergen Reactions  . Prednisone     Increases blood sugar     (Not in a hospital admission)  Results for orders placed or performed during the hospital encounter of 01/12/16 (from the past 48 hour(s))  Pregnancy, urine POC     Status: None   Collection Time: 01/12/16  8:19 AM  Result Value Ref Range   Preg Test, Ur NEGATIVE NEGATIVE    Comment:        THE SENSITIVITY OF THIS METHODOLOGY IS >24 mIU/mL   Glucose, capillary     Status: None   Collection Time: 01/12/16  8:25 AM  Result Value Ref Range   Glucose-Capillary 75 65 - 99 mg/dL   No results found.  Review of Systems  Constitutional: Negative.   HENT: Negative.   Eyes: Negative.   Respiratory: Negative.   Cardiovascular: Negative.   Gastrointestinal: Negative.   Musculoskeletal: Negative.   Skin: Negative.   Neurological: Negative.   Psychiatric/Behavioral:  Negative for depression, suicidal ideas, hallucinations, memory loss and substance abuse. The patient is not nervous/anxious and does not have insomnia.     Blood pressure 115/62, pulse 81, temperature 98.6 F (37 C), temperature source Tympanic, resp. rate 18, weight 101.606 kg (224 lb), last menstrual period 12/08/2015, SpO2 100 %. Physical Exam  Nursing note and vitals reviewed. Constitutional: She appears well-developed and well-nourished.  HENT:  Head: Normocephalic and atraumatic.  Eyes: Conjunctivae are normal. Pupils are equal, round, and reactive to light.  Neck: Normal range of motion.  Cardiovascular: Normal rate, regular rhythm and normal heart sounds.   Respiratory: Effort normal and breath sounds normal. No respiratory distress.  GI: Soft.  Musculoskeletal: Normal range of motion.  Neurological: She is alert.  Skin: Skin is warm and dry.  Psychiatric: She has a normal mood and affect. Her behavior is normal. Judgment and thought content normal.     Assessment/Plan ECT today follow-up one week. Attempts at transitioning to every other week have led to worsening depression.  [name], MD 01/12/2016, 10:10 AM

## 2016-01-12 NOTE — Anesthesia Preprocedure Evaluation (Signed)
Anesthesia Evaluation  Patient identified by MRN, date of birth, ID band Patient awake    Airway Mallampati: IV  TM Distance: >3 FB     Dental  (+) Teeth Intact   Pulmonary sleep apnea and Continuous Positive Airway Pressure Ventilation ,    Pulmonary exam normal        Cardiovascular Exercise Tolerance: Poor hypertension, Pt. on medications Normal cardiovascular exam     Neuro/Psych Depression Bipolar Disorder    GI/Hepatic GERD  Controlled,  Endo/Other  diabetes, Well Controlled, Type 2BG 75.  Renal/GU      Musculoskeletal   Abdominal (+) + obese,   Peds  Hematology   Anesthesia Other Findings   Reproductive/Obstetrics                             Anesthesia Physical Anesthesia Plan  ASA: III  Anesthesia Plan: General   Post-op Pain Management:    Induction: Intravenous  Airway Management Planned: Mask  Additional Equipment:   Intra-op Plan:   Post-operative Plan:   Informed Consent: I have reviewed the patients History and Physical, chart, labs and discussed the procedure including the risks, benefits and alternatives for the proposed anesthesia with the patient or authorized representative who has indicated his/her understanding and acceptance.     Plan Discussed with: CRNA  Anesthesia Plan Comments:         Anesthesia Quick Evaluation

## 2016-01-12 NOTE — Addendum Note (Signed)
Addendum  created 01/12/16 1208 by Elyse Hsu, MD   Modules edited: Clinical Notes   Clinical Notes:  File: [id number]

## 2016-01-12 NOTE — Anesthesia Postprocedure Evaluation (Signed)
Anesthesia Post Note  Patient: Melinda [name]  Procedure(s) Performed: * No procedures listed *  Patient location during evaluation: PACU Anesthesia Type: General Level of consciousness: awake and awake and alert Pain management: pain level controlled Vital Signs Assessment: post-procedure vital signs reviewed and stable Respiratory status: spontaneous breathing Cardiovascular status: blood pressure returned to baseline Postop Assessment: no headache Anesthetic complications: no    Last Vitals:  Filed Vitals:   01/12/16 1101 01/12/16 1114  BP: 113/69 119/76  Pulse: 90 86  Temp:    Resp: 20 18    Last Pain:  Filed Vitals:   01/12/16 1120  PainSc: 3                  Kem Parcher M

## 2016-01-12 NOTE — Anesthesia Procedure Notes (Signed)
Date/Time: 01/12/2016 10:20 AM Performed by: Dionne Bucy Pre-anesthesia Checklist: Patient identified, Emergency Drugs available, Suction available and Patient being monitored Patient Re-evaluated:Patient Re-evaluated prior to inductionOxygen Delivery Method: Circle system utilized Preoxygenation: Pre-oxygenation with 100% oxygen Intubation Type: IV induction Ventilation: Mask ventilation without difficulty and Mask ventilation throughout procedure Airway Equipment and Method: Bite block Placement Confirmation: positive ETCO2 Dental Injury: Teeth and Oropharynx as per pre-operative assessment

## 2016-01-12 NOTE — Procedures (Signed)
ECT SERVICES Physician's Interval Evaluation & Treatment Note  Patient Identification: Melinda [name] MRN:  [id number] Date of Evaluation:  01/12/2016 TX #: [id number]  MADRS:   MMSE:   P.E. Findings:  No new physical exam findings. Blood pressure and vitals stable heart normal lungs clear  Psychiatric Interval Note:  Mood has been good this week. No depression no complaints  Subjective:  Patient is a 45 y.o. female seen for evaluation for Electroconvulsive Therapy. No new complaints  Treatment Summary:   []   Right Unilateral             [x]  Bilateral   % Energy : 1.0 ms 35%   Impedance: 970 ohms  Seizure Energy Index: 9289 V squared  Postictal Suppression Index: 86%  Seizure Concordance Index: 98%  Medications  Pre Shock: Robinul 0.4 mg, Toradol 30 mg, Brevital 70 mg, labetalol 20 mg, esmolol 10 mg, succinylcholine 100 mg  Post Shock:    Seizure Duration: 37 seconds by EMG, 88 seconds by EEG   Comments: Follow-up in 1 week   Lungs:  [x]   Clear to auscultation               []  Other:   Heart:    [x]   Regular rhythm             []  irregular rhythm    [x]   Previous H&P reviewed, patient examined and there are NO CHANGES                 []   Previous H&P reviewed, patient examined and there are changes noted.   [name], MD 3/3/201710:13 AM

## 2016-01-19 ENCOUNTER — Encounter (HOSPITAL_BASED_OUTPATIENT_CLINIC_OR_DEPARTMENT_OTHER)
Admission: RE | Admit: 2016-01-19 | Discharge: 2016-01-19 | Disposition: A | Discharge status: Home / Self Care | Payer: Medicare PPO | Source: Ambulatory Visit | Attending: Psychiatry | Admitting: Psychiatry

## 2016-01-19 ENCOUNTER — Encounter: Payer: Self-pay | Admitting: Anesthesiology

## 2016-01-19 DIAGNOSIS — E7800 Pure hypercholesterolemia, unspecified: Secondary | ICD-10-CM | POA: Diagnosis not present

## 2016-01-19 DIAGNOSIS — K219 Gastro-esophageal reflux disease without esophagitis: Secondary | ICD-10-CM | POA: Diagnosis not present

## 2016-01-19 DIAGNOSIS — F332 Major depressive disorder, recurrent severe without psychotic features: Secondary | ICD-10-CM

## 2016-01-19 DIAGNOSIS — E119 Type 2 diabetes mellitus without complications: Secondary | ICD-10-CM | POA: Diagnosis not present

## 2016-01-19 DIAGNOSIS — I10 Essential (primary) hypertension: Secondary | ICD-10-CM | POA: Diagnosis not present

## 2016-01-19 DIAGNOSIS — E669 Obesity, unspecified: Secondary | ICD-10-CM | POA: Diagnosis not present

## 2016-01-19 LAB — GLUCOSE, CAPILLARY: Glucose-Capillary: 73 mg/dL (ref 65–99)

## 2016-01-19 LAB — POCT PREGNANCY, URINE: Preg Test, Ur: NEGATIVE

## 2016-01-19 MED ORDER — KETOROLAC TROMETHAMINE 30 MG/ML IJ SOLN
30.0000 mg | Freq: Once | INTRAMUSCULAR | Status: AC
  Administered 2016-01-19: 30 mg via INTRAVENOUS

## 2016-01-19 MED ORDER — SODIUM CHLORIDE 0.9 % IV SOLN
INTRAVENOUS | Status: DC | PRN
  Administered 2016-01-19: 10:00:00 via INTRAVENOUS

## 2016-01-19 MED ORDER — METHOHEXITAL SODIUM 100 MG/10ML IV SOSY
PREFILLED_SYRINGE | INTRAVENOUS | Status: DC | PRN
  Administered 2016-01-19: 70 mg via INTRAVENOUS

## 2016-01-19 MED ORDER — SODIUM CHLORIDE 0.9 % IV SOLN
250.0000 mL | Freq: Once | INTRAVENOUS | Status: AC
  Administered 2016-01-19: 500 mL via INTRAVENOUS

## 2016-01-19 MED ORDER — ESMOLOL HCL-SODIUM CHLORIDE 2000 MG/100ML IV SOLN: 10.0000 mg | Freq: Once | INTRAVENOUS | Status: DC

## 2016-01-19 MED ORDER — ESMOLOL HCL 100 MG/10ML IV SOLN
INTRAVENOUS | Status: DC | PRN
  Administered 2016-01-19: 10 mg via INTRAVENOUS

## 2016-01-19 MED ORDER — SUCCINYLCHOLINE CHLORIDE 20 MG/ML IJ SOLN
INTRAMUSCULAR | Status: DC | PRN
  Administered 2016-01-19: 100 mg via INTRAVENOUS

## 2016-01-19 MED ORDER — LABETALOL HCL 5 MG/ML IV SOLN
20.0000 mg | Freq: Once | INTRAVENOUS | Status: AC
  Administered 2016-01-19: 20 mg via INTRAVENOUS

## 2016-01-19 MED ORDER — GLYCOPYRROLATE 0.2 MG/ML IJ SOLN
0.4000 mg | Freq: Once | INTRAMUSCULAR | Status: AC
  Administered 2016-01-19: 0.4 mg via INTRAVENOUS

## 2016-01-19 NOTE — Anesthesia Procedure Notes (Signed)
Date/Time: 01/19/2016 10:15 AM Performed by: Johnna Acosta Pre-anesthesia Checklist: Patient identified, Emergency Drugs available, Suction available, Patient being monitored and Timeout performed Patient Re-evaluated:Patient Re-evaluated prior to inductionOxygen Delivery Method: Circle system utilized Preoxygenation: Pre-oxygenation with 100% oxygen Ventilation: Mask ventilation without difficulty

## 2016-01-19 NOTE — Anesthesia Postprocedure Evaluation (Signed)
Anesthesia Post Note  Patient: Melinda [name]  Procedure(s) Performed: * No procedures listed *  Patient location during evaluation: PACU Anesthesia Type: General Level of consciousness: awake and alert Pain management: pain level controlled Vital Signs Assessment: post-procedure vital signs reviewed and stable Respiratory status: spontaneous breathing, nonlabored ventilation, respiratory function stable and patient connected to nasal cannula oxygen Cardiovascular status: blood pressure returned to baseline and stable Postop Assessment: no signs of nausea or vomiting Anesthetic complications: no    Last Vitals:  Filed Vitals:   01/19/16 1105 01/19/16 1109  BP:  124/76  Pulse: 88   Temp:    Resp: 16     Last Pain:  Filed Vitals:   01/19/16 1110  PainSc: 0-No pain                 Precious Haws Jasdeep Kepner

## 2016-01-19 NOTE — H&P (Signed)
Melinda [name] is an 45 y.o. female.   Chief Complaint: No new complaints today. Patient receiving maintenance ECT treatment. Mood is stable. No return of suicidal thoughts. HPI: Long-standing depression responsive to maintenance ECT. Partial improvement still limited functionality at home but at least not suicidal or self injuring  Past Medical History  Diagnosis Date  . Depression   . Diabetes mellitus without complication ([place])   . GERD (gastroesophageal reflux disease)   . Hypercholesterolemia 03/07/15  . Hypertension   . Obesity 03/07/15  . Personality disorder 03/07/15  . Sinus tachycardia ([place]) 03/07/15    history of  . Suicidal thoughts 03/07/15  . Diabetic peripheral neuropathy ([place]) 03/07/15  . Diabetic peripheral neuropathy ([place]) 03/07/15  . Diabetic peripheral neuropathy ([place]) 03/07/15    Past Surgical History  Procedure Laterality Date  . Electroconvulsion therapy  03/07/15    Family History  Problem Relation Age of Onset  . Hypertension Father   . Diabetes Mother    Social History:  reports that she has never smoked. She has never used smokeless tobacco. She reports that she does not drink alcohol or use illicit drugs.  Allergies:  Allergies  Allergen Reactions  . Prednisone     Increases blood sugar     (Not in a hospital admission)  Results for orders placed or performed during the hospital encounter of 01/19/16 (from the past 48 hour(s))  Pregnancy, urine POC     Status: None   Collection Time: 01/19/16  8:35 AM  Result Value Ref Range   Preg Test, Ur NEGATIVE NEGATIVE    Comment:        THE SENSITIVITY OF THIS METHODOLOGY IS >24 mIU/mL   Glucose, capillary     Status: None   Collection Time: 01/19/16  8:45 AM  Result Value Ref Range   Glucose-Capillary 73 65 - 99 mg/dL   No results found.  Review of Systems  Constitutional: Negative.   HENT: Negative.   Eyes: Negative.   Respiratory: Negative.   Cardiovascular: Negative.   Gastrointestinal:  Negative.   Musculoskeletal: Negative.   Skin: Negative.   Neurological: Negative.   Psychiatric/Behavioral: Negative for depression, suicidal ideas, hallucinations, memory loss and substance abuse. The patient is not nervous/anxious and does not have insomnia.     Blood pressure 135/72, pulse 80, temperature 97 F (36.1 C), temperature source Oral, resp. rate 18, weight 102.967 kg (227 lb), last menstrual period 12/08/2015, SpO2 100 %. Physical Exam  Nursing note and vitals reviewed. Constitutional: She appears well-developed and well-nourished.  HENT:  Head: Normocephalic and atraumatic.  Eyes: Conjunctivae are normal. Pupils are equal, round, and reactive to light.  Neck: Normal range of motion.  Cardiovascular: Normal rate, regular rhythm and normal heart sounds.   Respiratory: Effort normal and breath sounds normal. No respiratory distress.  GI: Soft.  Musculoskeletal: Normal range of motion.  Neurological: She is alert.  Skin: Skin is warm and dry.  Psychiatric: She has a normal mood and affect. Her behavior is normal. Judgment and thought content normal.     Assessment/Plan ECT today follow-up next week no change to treatment plan otherwise.  [name], MD 01/19/2016, 10:14 AM

## 2016-01-19 NOTE — Procedures (Signed)
ECT SERVICES Physician's Interval Evaluation & Treatment Note  Patient Identification: Melinda [name] MRN:  [id number] Date of Evaluation:  01/19/2016 TX #: [id number]  MADRS:   MMSE:   P.E. Findings:  Mood is stable vital signs stable lungs are clear heart is normal  Psychiatric Interval Note:  Mood is stable no suicidal thoughts not tearful not agitated not confused  Subjective:  Patient is a 45 y.o. female seen for evaluation for Electroconvulsive Therapy. No specific new complaint  Treatment Summary:   []   Right Unilateral             [x]  Bilateral   % Energy : 1.0 ms, 35%   Impedance: 1730 ohms  Seizure Energy Index: 4569 V squared  Postictal Suppression Index: 70%  Seizure Concordance Index: 98  Medications  Pre Shock: Robinul 0.4 mg, Toradol 30 mg, labetalol 20 mg, esmolol 10 mg, Brevital tall 70 mg, succinylcholine 100 mg  Post Shock:    Seizure Duration: 42 seconds by EMG, 82 seconds by EEG   Comments: Follow-up in one week on the 17th.   Lungs:  [x]   Clear to auscultation               []  Other:   Heart:    [x]   Regular rhythm             []  irregular rhythm    [x]   Previous H&P reviewed, patient examined and there are NO CHANGES                 []   Previous H&P reviewed, patient examined and there are changes noted.   [name], MD 3/10/201710:16 AM

## 2016-01-19 NOTE — Anesthesia Preprocedure Evaluation (Signed)
Anesthesia Evaluation  Patient identified by MRN, date of birth, ID band Patient awake    Reviewed: Allergy & Precautions, H&P , NPO status , Patient's Chart, lab work & pertinent test results  History of Anesthesia Complications Negative for: history of anesthetic complications  Airway Mallampati: IV  TM Distance: >3 FB Neck ROM: Limited    Dental  (+) Poor Dentition   Pulmonary sleep apnea and Continuous Positive Airway Pressure Ventilation ,    Pulmonary exam normal        Cardiovascular Exercise Tolerance: Poor hypertension, Pt. on medications Normal cardiovascular exam     Neuro/Psych PSYCHIATRIC DISORDERS Depression Bipolar Disorder DIabetic neuropathy.  Neuromuscular disease    GI/Hepatic GERD  Medicated and Controlled,  Endo/Other  diabetes, Type 2Morbid obesityBG 77.  Renal/GU      Musculoskeletal   Abdominal (+) + obese,   Peds  Hematology   Anesthesia Other Findings Past Medical History:   Depression                                                   Diabetes mellitus without complication ([place])                 GERD (gastroesophageal reflux disease)                       Hypercholesterolemia                            03/07/15      Hypertension                                                 Obesity                                         03/07/15      Personality disorder                            03/07/15      Sinus tachycardia ([place])                         03/07/15        Comment:history of   Suicidal thoughts                               03/07/15      Diabetic peripheral neuropathy ([place])            03/07/15      Diabetic peripheral neuropathy ([place])            03/07/15      Diabetic peripheral neuropathy ([place])            03/07/15     BMI    Body Mass Index   38.10 kg/m 2      Reproductive/Obstetrics  Anesthesia Physical  Anesthesia Plan  ASA:  III  Anesthesia Plan: General   Post-op Pain Management:    Induction: Intravenous  Airway Management Planned: Mask  Additional Equipment:   Intra-op Plan:   Post-operative Plan:   Informed Consent: I have reviewed the patients History and Physical, chart, labs and discussed the procedure including the risks, benefits and alternatives for the proposed anesthesia with the patient or authorized representative who has indicated his/her understanding and acceptance.     Plan Discussed with:   Anesthesia Plan Comments:         Anesthesia Quick Evaluation

## 2016-01-19 NOTE — Transfer of Care (Signed)
Immediate Anesthesia Transfer of Care Note  Patient: Melinda [name]  Procedure(s) Performed: * No procedures listed *  Patient Location: PACU  Anesthesia Type:General  Level of Consciousness: sedated  Airway & Oxygen Therapy: Patient Spontanous Breathing and Patient connected to face mask oxygen  Post-op Assessment: Report given to RN  Post vital signs: Reviewed and stable  Last Vitals:  Filed Vitals:   01/19/16 0832  BP: 135/72  Pulse: 80  Temp: 36.1 C  Resp: 18    Complications: No apparent anesthesia complications

## 2016-01-26 ENCOUNTER — Encounter: Payer: Self-pay | Admitting: Anesthesiology

## 2016-01-26 ENCOUNTER — Encounter (HOSPITAL_BASED_OUTPATIENT_CLINIC_OR_DEPARTMENT_OTHER)
Admission: RE | Admit: 2016-01-26 | Discharge: 2016-01-26 | Disposition: A | Discharge status: Home / Self Care | Payer: Medicare PPO | Source: Ambulatory Visit | Attending: Psychiatry | Admitting: Psychiatry

## 2016-01-26 DIAGNOSIS — K219 Gastro-esophageal reflux disease without esophagitis: Secondary | ICD-10-CM | POA: Diagnosis not present

## 2016-01-26 DIAGNOSIS — F332 Major depressive disorder, recurrent severe without psychotic features: Secondary | ICD-10-CM | POA: Diagnosis not present

## 2016-01-26 DIAGNOSIS — E119 Type 2 diabetes mellitus without complications: Secondary | ICD-10-CM | POA: Diagnosis not present

## 2016-01-26 DIAGNOSIS — I10 Essential (primary) hypertension: Secondary | ICD-10-CM | POA: Diagnosis not present

## 2016-01-26 DIAGNOSIS — E669 Obesity, unspecified: Secondary | ICD-10-CM | POA: Diagnosis not present

## 2016-01-26 DIAGNOSIS — E7800 Pure hypercholesterolemia, unspecified: Secondary | ICD-10-CM | POA: Diagnosis not present

## 2016-01-26 LAB — POCT PREGNANCY, URINE: Preg Test, Ur: NEGATIVE

## 2016-01-26 LAB — GLUCOSE, CAPILLARY: Glucose-Capillary: 77 mg/dL (ref 65–99)

## 2016-01-26 MED ORDER — ESMOLOL HCL-SODIUM CHLORIDE 2000 MG/100ML IV SOLN
10.0000 mg | Freq: Once | INTRAVENOUS | Status: AC
  Administered 2016-01-26: 10 mg via INTRAVENOUS

## 2016-01-26 MED ORDER — LABETALOL HCL 5 MG/ML IV SOLN
INTRAVENOUS | Status: DC | PRN
  Administered 2016-01-26: 20 mg via INTRAVENOUS

## 2016-01-26 MED ORDER — LABETALOL HCL 5 MG/ML IV SOLN: 20.0000 mg | Freq: Once | INTRAVENOUS | Status: DC

## 2016-01-26 MED ORDER — GLYCOPYRROLATE 0.2 MG/ML IJ SOLN
0.4000 mg | Freq: Once | INTRAMUSCULAR | Status: AC
  Administered 2016-01-26: 0.4 mg via INTRAVENOUS

## 2016-01-26 MED ORDER — SODIUM CHLORIDE 0.9 % IV SOLN
INTRAVENOUS | Status: DC | PRN
  Administered 2016-01-26: 10:00:00 via INTRAVENOUS

## 2016-01-26 MED ORDER — METHOHEXITAL SODIUM 100 MG/10ML IV SOSY
PREFILLED_SYRINGE | INTRAVENOUS | Status: DC | PRN
  Administered 2016-01-26: 70 mg via INTRAVENOUS

## 2016-01-26 MED ORDER — SODIUM CHLORIDE 0.9 % IV SOLN
250.0000 mL | Freq: Once | INTRAVENOUS | Status: AC
  Administered 2016-01-26: 500 mL via INTRAVENOUS

## 2016-01-26 MED ORDER — SUCCINYLCHOLINE CHLORIDE 20 MG/ML IJ SOLN
INTRAMUSCULAR | Status: DC | PRN
  Administered 2016-01-26: 100 mg via INTRAVENOUS

## 2016-01-26 MED ORDER — KETOROLAC TROMETHAMINE 30 MG/ML IJ SOLN
30.0000 mg | Freq: Once | INTRAMUSCULAR | Status: AC
  Administered 2016-01-26: 30 mg via INTRAVENOUS

## 2016-01-26 NOTE — Procedures (Signed)
ECT SERVICES Physician's Interval Evaluation & Treatment Note  Patient Identification: Melinda [name] MRN:  [id number] Date of Evaluation:  01/26/2016 TX #: [id number]  MADRS:   MMSE:   P.E. Findings:  Physical exam calm stable. No change to cardiac or lung status  Psychiatric Interval Note:  Chronic mild depression under good controland suicidal ideation  Subjective:  Patient is a 45 y.o. female seen for evaluation for Electroconvulsive Therapy. No new complaints. Feeling good.  Treatment Summary:   []   Right Unilateral             []  Bilateral   % Energy : 1.0 ms 35%   Impedance: 1280 ohms  Seizure Energy Index: 7718 V squared  Postictal Suppression Index: 89%  Seizure Concordance Index: 99%  Medications  Pre Shock: Robinul 0.4 mg, Toradol 30 mg, labetalol 20 mg, esmolol 10 mg, Brevital 70 mg, succinylcholine 100 mg  Post Shock:    Seizure Duration: 45 seconds by EMG, 90 seconds by EEG   Comments: Follow-up in 1 week: The 24th   Lungs:  [x]   Clear to auscultation               []  Other:   Heart:    [x]   Regular rhythm             []  irregular rhythm    [x]   Previous H&P reviewed, patient examined and there are NO CHANGES                 []   Previous H&P reviewed, patient examined and there are changes noted.   [name], MD 3/17/201710:09 AM

## 2016-01-26 NOTE — Anesthesia Preprocedure Evaluation (Signed)
Anesthesia Evaluation  Patient identified by MRN, date of birth, ID band Patient awake    Reviewed: Allergy & Precautions, NPO status , Patient's Chart, lab work & pertinent test results  Airway Mallampati: III       Dental  (+) Teeth Intact   Pulmonary sleep apnea ,    Pulmonary exam normal        Cardiovascular Exercise Tolerance: Good hypertension, Pt. on medications and Pt. on home beta blockers  Rhythm:Regular     Neuro/Psych Depression Bipolar Disorder    GI/Hepatic Neg liver ROS,   Endo/Other  diabetes, Type 2, Oral Hypoglycemic AgentsMorbid obesity  Renal/GU negative Renal ROS     Musculoskeletal   Abdominal Normal abdominal exam  (+) + obese,   Peds  Hematology negative hematology ROS (+)   Anesthesia Other Findings   Reproductive/Obstetrics                             Anesthesia Physical Anesthesia Plan  ASA: III  Anesthesia Plan: General   Post-op Pain Management:    Induction: Intravenous  Airway Management Planned: Mask and Natural Airway  Additional Equipment:   Intra-op Plan:   Post-operative Plan:   Informed Consent: I have reviewed the patients History and Physical, chart, labs and discussed the procedure including the risks, benefits and alternatives for the proposed anesthesia with the patient or authorized representative who has indicated his/her understanding and acceptance.     Plan Discussed with: CRNA  Anesthesia Plan Comments:         Anesthesia Quick Evaluation

## 2016-01-26 NOTE — H&P (Signed)
Melinda [name] is an 45 y.o. female.   Chief Complaint: Patient reports mood is stable. No new physical complaints HPI: Chronic depression responding well to weekly ECT no new physical problems. Blood sugars under good control blood pressure good.  Past Medical History  Diagnosis Date  . Depression   . Diabetes mellitus without complication ([place])   . GERD (gastroesophageal reflux disease)   . Hypercholesterolemia 03/07/15  . Hypertension   . Obesity 03/07/15  . Personality disorder 03/07/15  . Sinus tachycardia ([place]) 03/07/15    history of  . Suicidal thoughts 03/07/15  . Diabetic peripheral neuropathy ([place]) 03/07/15  . Diabetic peripheral neuropathy ([place]) 03/07/15  . Diabetic peripheral neuropathy ([place]) 03/07/15    Past Surgical History  Procedure Laterality Date  . Electroconvulsion therapy  03/07/15    Family History  Problem Relation Age of Onset  . Hypertension Father   . Diabetes Mother    Social History:  reports that she has never smoked. She has never used smokeless tobacco. She reports that she does not drink alcohol or use illicit drugs.  Allergies:  Allergies  Allergen Reactions  . Prednisone     Increases blood sugar     (Not in a hospital admission)  Results for orders placed or performed during the hospital encounter of 01/26/16 (from the past 48 hour(s))  Pregnancy, urine POC     Status: None   Collection Time: 01/26/16  8:11 AM  Result Value Ref Range   Preg Test, Ur NEGATIVE NEGATIVE    Comment:        THE SENSITIVITY OF THIS METHODOLOGY IS >24 mIU/mL   Glucose, capillary     Status: None   Collection Time: 01/26/16  8:19 AM  Result Value Ref Range   Glucose-Capillary 77 65 - 99 mg/dL   No results found.  Review of Systems  Constitutional: Negative.   HENT: Negative.   Eyes: Negative.   Respiratory: Negative.   Cardiovascular: Negative.   Gastrointestinal: Negative.   Musculoskeletal: Negative.   Skin: Negative.   Neurological: Negative.    Psychiatric/Behavioral: Negative for depression, suicidal ideas, hallucinations, memory loss and substance abuse. The patient is not nervous/anxious and does not have insomnia.     Blood pressure 128/74, pulse 92, temperature 97.2 F (36.2 C), temperature source Oral, resp. rate 18, height '5\' 5"'$  (1.651 m), weight 102.059 kg (225 lb), last menstrual period 12/08/2015, SpO2 100 %. Physical Exam  Nursing note and vitals reviewed. Constitutional: She appears well-developed and well-nourished.  HENT:  Head: Normocephalic and atraumatic.  Eyes: Conjunctivae are normal. Pupils are equal, round, and reactive to light.  Neck: Normal range of motion.  Cardiovascular: Normal rate, regular rhythm and normal heart sounds.   Respiratory: Effort normal.  GI: Soft.  Musculoskeletal: Normal range of motion.  Neurological: She is alert.  Skin: Skin is warm and dry.  Psychiatric: She has a normal mood and affect. Her behavior is normal. Judgment and thought content normal.     Assessment/Plan No significant side effects of ECT. Patient agrees to continued treatment. Attempts to decrease frequency have met with worsening depression in the past.  [name], MD 01/26/2016, 10:06 AM

## 2016-01-26 NOTE — Transfer of Care (Signed)
Immediate Anesthesia Transfer of Care Note  Patient: Melinda [name]  Procedure(s) Performed: * No procedures listed *  Patient Location: PACU  Anesthesia Type:General  Level of Consciousness: patient cooperative and lethargic  Airway & Oxygen Therapy: Patient Spontanous Breathing and Patient connected to face mask oxygen  Post-op Assessment: Report given to RN and Post -op Vital signs reviewed and stable  Post vital signs: Reviewed and stable  Last Vitals:  Filed Vitals:   01/26/16 0805 01/26/16 1026  BP: 128/74   Pulse: 92   Temp: 36.2 C 37.4 C  Resp: 18 20    Complications: No apparent anesthesia complications

## 2016-02-02 ENCOUNTER — Encounter (HOSPITAL_BASED_OUTPATIENT_CLINIC_OR_DEPARTMENT_OTHER)
Admission: RE | Admit: 2016-02-02 | Discharge: 2016-02-02 | Disposition: A | Discharge status: Home / Self Care | Payer: Medicare PPO | Source: Ambulatory Visit | Attending: Psychiatry | Admitting: Psychiatry

## 2016-02-02 ENCOUNTER — Encounter: Payer: Self-pay | Admitting: Anesthesiology

## 2016-02-02 DIAGNOSIS — E119 Type 2 diabetes mellitus without complications: Secondary | ICD-10-CM | POA: Diagnosis not present

## 2016-02-02 DIAGNOSIS — I10 Essential (primary) hypertension: Secondary | ICD-10-CM | POA: Diagnosis not present

## 2016-02-02 DIAGNOSIS — E7800 Pure hypercholesterolemia, unspecified: Secondary | ICD-10-CM | POA: Diagnosis not present

## 2016-02-02 DIAGNOSIS — F332 Major depressive disorder, recurrent severe without psychotic features: Secondary | ICD-10-CM

## 2016-02-02 DIAGNOSIS — K219 Gastro-esophageal reflux disease without esophagitis: Secondary | ICD-10-CM | POA: Diagnosis not present

## 2016-02-02 DIAGNOSIS — E669 Obesity, unspecified: Secondary | ICD-10-CM | POA: Diagnosis not present

## 2016-02-02 LAB — GLUCOSE, CAPILLARY: Glucose-Capillary: 99 mg/dL (ref 65–99)

## 2016-02-02 MED ORDER — MIDAZOLAM HCL 2 MG/2ML IJ SOLN
INTRAMUSCULAR | Status: DC | PRN
  Administered 2016-02-02: 1 mg via INTRAVENOUS

## 2016-02-02 MED ORDER — LABETALOL HCL 5 MG/ML IV SOLN
INTRAVENOUS | Status: DC | PRN
  Administered 2016-02-02: 20 mg via INTRAVENOUS

## 2016-02-02 MED ORDER — METHOHEXITAL SODIUM 100 MG/10ML IV SOSY
PREFILLED_SYRINGE | INTRAVENOUS | Status: DC | PRN
  Administered 2016-02-02: 70 mg via INTRAVENOUS

## 2016-02-02 MED ORDER — KETOROLAC TROMETHAMINE 30 MG/ML IJ SOLN
30.0000 mg | Freq: Once | INTRAMUSCULAR | Status: AC
  Administered 2016-02-02: 30 mg via INTRAVENOUS

## 2016-02-02 MED ORDER — SUCCINYLCHOLINE 20MG/ML (10ML) SYRINGE FOR MEDFUSION PUMP - OPTIME
INTRAMUSCULAR | Status: DC | PRN
  Administered 2016-02-02: 100 mg via INTRAVENOUS

## 2016-02-02 MED ORDER — SODIUM CHLORIDE 0.9 % IV SOLN
250.0000 mL | Freq: Once | INTRAVENOUS | Status: AC
  Administered 2016-02-02: 500 mL via INTRAVENOUS

## 2016-02-02 MED ORDER — ESMOLOL HCL-SODIUM CHLORIDE 2000 MG/100ML IV SOLN: 10.0000 mg | Freq: Once | INTRAVENOUS | Status: DC

## 2016-02-02 MED ORDER — GLYCOPYRROLATE 0.2 MG/ML IJ SOLN
0.4000 mg | Freq: Once | INTRAMUSCULAR | Status: AC
  Administered 2016-02-02: 0.4 mg via INTRAVENOUS

## 2016-02-02 MED ORDER — SODIUM CHLORIDE 0.9 % IV SOLN
INTRAVENOUS | Status: DC | PRN
  Administered 2016-02-02: 10:00:00 via INTRAVENOUS

## 2016-02-02 MED ORDER — ESMOLOL HCL 100 MG/10ML IV SOLN
INTRAVENOUS | Status: DC | PRN
  Administered 2016-02-02: 10 mg via INTRAVENOUS

## 2016-02-02 NOTE — Anesthesia Preprocedure Evaluation (Signed)
Anesthesia Evaluation  Patient identified by MRN, date of birth, ID band Patient awake    Reviewed: Allergy & Precautions, H&P , NPO status , Patient's Chart, lab work & pertinent test results, reviewed documented beta blocker date and time   Airway Mallampati: II   Neck ROM: full    Dental  (+) Poor Dentition   Pulmonary neg pulmonary ROS, sleep apnea ,    Pulmonary exam normal        Cardiovascular hypertension, negative cardio ROS Normal cardiovascular exam Rate:Normal     Neuro/Psych PSYCHIATRIC DISORDERS  Neuromuscular disease negative neurological ROS  negative psych ROS   GI/Hepatic negative GI ROS, Neg liver ROS, GERD  ,  Endo/Other  negative endocrine ROSdiabetes  Renal/GU negative Renal ROS  negative genitourinary   Musculoskeletal   Abdominal   Peds  Hematology negative hematology ROS (+)   Anesthesia Other Findings Past Medical History:   Depression                                                   Diabetes mellitus without complication ([place])                 GERD (gastroesophageal reflux disease)                       Hypercholesterolemia                            03/07/15      Hypertension                                                 Obesity                                         03/07/15      Personality disorder                            03/07/15      Sinus tachycardia ([place])                         03/07/15        Comment:history of   Suicidal thoughts                               03/07/15      Diabetic peripheral neuropathy ([place])            03/07/15      Diabetic peripheral neuropathy ([place])            03/07/15      Diabetic peripheral neuropathy ([place])            03/07/15    Past Surgical History:   electroconvulsion therapy                        03/07/15    BMI    Body Mass Index  37.44 kg/m 2     Reproductive/Obstetrics                             Anesthesia  Physical Anesthesia Plan  ASA: III  Anesthesia Plan: General   Post-op Pain Management:    Induction:   Airway Management Planned:   Additional Equipment:   Intra-op Plan:   Post-operative Plan:   Informed Consent: I have reviewed the patients History and Physical, chart, labs and discussed the procedure including the risks, benefits and alternatives for the proposed anesthesia with the patient or authorized representative who has indicated his/her understanding and acceptance.   Dental Advisory Given  Plan Discussed with: CRNA  Anesthesia Plan Comments:         Anesthesia Quick Evaluation

## 2016-02-02 NOTE — Discharge Instructions (Signed)
1)  The drugs that you have been given will stay in your system until tomorrow so for the       next 24 hours you should not:  A. Drive an automobile  B. Make any legal decisions  C. Drink any alcoholic beverages  2)  You may resume your regular meals upon return home.  3)  A responsible adult must take you home.  Someone should stay with you for a few          hours, then be available by phone for the remainder of the treatment day.  4)  You May experience any of the following symptoms:  Headache, Nausea and a dry mouth (due to the medications you were given),  temporary memory loss and some confusion, or sore muscles (a warm bath  should help this).  If you you experience any of these symptoms let us know on                your return visit.  5)  Report any of the following: any acute discomfort, severe headache, or temperature        greater than 100.5 F.   Also report any unusual redness, swelling, drainage, or pain         at your IV site.    You may report Symptoms to:  [place] [facility]          Phone: [phone], ECT Department           or Dr. Prescott Gum office [phone]  6)  Your next ECT Treatment is Day friday  Date April 7  We will call 2 days prior to your scheduled appointment for arrival times.  7)  Nothing to eat or drink after midnight the night before your procedure.  8)  Take Lisinopril     With a sip of water the morning of your procedure.  9)  Other Instructions: Call [phone] to cancel the morning of your procedure due         to illness or emergency.  10) We will call within 72 hours to assess how you are feeling.

## 2016-02-02 NOTE — H&P (Signed)
Melinda [name] is an 45 y.o. female.   Chief Complaint: Patient has no new complaint. Mood has been stable. No suicidal ideation. Physically doing well HPI: History of recurrent severe depression has been stabilized on current treatment regimen of ECT  Past Medical History  Diagnosis Date  . Depression   . Diabetes mellitus without complication ([place])   . GERD (gastroesophageal reflux disease)   . Hypercholesterolemia 03/07/15  . Hypertension   . Obesity 03/07/15  . Personality disorder 03/07/15  . Sinus tachycardia ([place]) 03/07/15    history of  . Suicidal thoughts 03/07/15  . Diabetic peripheral neuropathy ([place]) 03/07/15  . Diabetic peripheral neuropathy ([place]) 03/07/15  . Diabetic peripheral neuropathy ([place]) 03/07/15    Past Surgical History  Procedure Laterality Date  . Electroconvulsion therapy  03/07/15    Family History  Problem Relation Age of Onset  . Hypertension Father   . Diabetes Mother    Social History:  reports that she has never smoked. She has never used smokeless tobacco. She reports that she does not drink alcohol or use illicit drugs.  Allergies:  Allergies  Allergen Reactions  . Prednisone     Increases blood sugar     (Not in a hospital admission)  Results for orders placed or performed during the hospital encounter of 02/02/16 (from the past 48 hour(s))  Glucose, capillary     Status: None   Collection Time: 02/02/16  8:12 AM  Result Value Ref Range   Glucose-Capillary 99 65 - 99 mg/dL   No results found.  Review of Systems  Constitutional: Negative.   HENT: Negative.   Eyes: Negative.   Respiratory: Negative.   Cardiovascular: Negative.   Gastrointestinal: Negative.   Musculoskeletal: Negative.   Skin: Negative.   Neurological: Negative.   Psychiatric/Behavioral: Negative for depression, suicidal ideas, hallucinations, memory loss and substance abuse. The patient is not nervous/anxious and does not have insomnia.     Blood pressure  111/59, pulse 79, temperature 96.8 F (36 C), temperature source Tympanic, resp. rate 18, weight 102.059 kg (225 lb), SpO2 99 %. Physical Exam  Nursing note and vitals reviewed. Constitutional: She appears well-developed and well-nourished.  HENT:  Head: Normocephalic and atraumatic.  Eyes: Conjunctivae are normal. Pupils are equal, round, and reactive to light.  Neck: Normal range of motion.  Cardiovascular: Normal rate, regular rhythm and normal heart sounds.   Respiratory: Effort normal and breath sounds normal. No respiratory distress.  GI: Soft.  Musculoskeletal: Normal range of motion.  Neurological: She is alert.  Skin: Skin is warm and dry.  Psychiatric: She has a normal mood and affect. Her behavior is normal. Judgment and thought content normal.     Assessment/Plan Bilateral ECT today as usual. I will be out of the office next week so next treatment will be scheduled for April 7. No other change to treatment plan. Medicines appears stable. Blood sugar good vital signs good.  [name], MD 02/02/2016, 10:20 AM

## 2016-02-02 NOTE — Procedures (Signed)
ECT SERVICES Physician's Interval Evaluation & Treatment Note  Patient Identification: SHAMIRRA ARMSTEAD MRN:  [id number] Date of Evaluation:  02/02/2016 TX #: [id number]  MADRS:   MMSE:   P.E. Findings:  No change to physical exam vital signs stable blood sugar good. Lungs and heart clear.  Psychiatric Interval Note:  Mood is stable no return of severe depression no suicidality no self injuring behavior  Subjective:  Patient is a 45 y.o. female seen for evaluation for Electroconvulsive Therapy. No specific new complaints  Treatment Summary:   []   Right Unilateral             [x]  Bilateral   % Energy : 1.0 ms, 35%   Impedance: 640 ohms  Seizure Energy Index: 2964 V squared  Postictal Suppression Index: 90%  Seizure Concordance Index: 96%  Medications  Pre Shock: Robinul 0.4 mg, Toradol 30 mg, labetalol 20 mg, esmolol 10 mg, Brevital 70 mg, succinylcholine 100 mg  Post Shock: None  Seizure Duration: 44 seconds by EMG, 76 seconds by EEG   Comments: I will be away from the office next week and we will see her back in 2 weeks on April 7.   Lungs:  [x]   Clear to auscultation               []  Other:   Heart:    [x]   Regular rhythm             []  irregular rhythm    [x]   Previous H&P reviewed, patient examined and there are NO CHANGES                 []   Previous H&P reviewed, patient examined and there are changes noted.   [name], MD 3/24/201710:22 AM

## 2016-02-02 NOTE — Anesthesia Postprocedure Evaluation (Signed)
Anesthesia Post Note  Patient: Melinda [name]  Procedure(s) Performed: * No procedures listed *  Patient location during evaluation: PACU Anesthesia Type: General Level of consciousness: awake and alert Pain management: pain level controlled Vital Signs Assessment: post-procedure vital signs reviewed and stable Respiratory status: spontaneous breathing, nonlabored ventilation, respiratory function stable and patient connected to nasal cannula oxygen Cardiovascular status: blood pressure returned to baseline and stable Postop Assessment: no signs of nausea or vomiting Anesthetic complications: no    Last Vitals:  Filed Vitals:   02/02/16 0813 02/02/16 1041  BP: 111/59 135/77  Pulse: 79 98  Temp: 36 C 37 C  Resp: 18 20    Last Pain:  Filed Vitals:   02/02/16 1044  PainSc: [place] [name]

## 2016-02-02 NOTE — Transfer of Care (Signed)
Immediate Anesthesia Transfer of Care Note  Patient: Melinda [name]  Procedure(s) Performed: ECT  Patient Location: PACU  Anesthesia Type:General  Level of Consciousness: sedated  Airway & Oxygen Therapy: Patient Spontanous Breathing and Patient connected to face mask oxygen  Post-op Assessment: Report given to RN  Post vital signs: Reviewed and stable  Last Vitals:  Filed Vitals:   02/02/16 0813 02/02/16 1041  BP: 111/59 135/77  Pulse: 79 98  Temp: 36 C 37 C  Resp: 18 20    Complications: No apparent anesthesia complications

## 2016-02-02 NOTE — Anesthesia Procedure Notes (Signed)
Performed by: Brea Coleson Pre-anesthesia Checklist: Patient identified, Emergency Drugs available, Suction available and Patient being monitored Patient Re-evaluated:Patient Re-evaluated prior to inductionOxygen Delivery Method: Circle system utilized Preoxygenation: Pre-oxygenation with 100% oxygen Intubation Type: IV induction Ventilation: Mask ventilation without difficulty and Mask ventilation throughout procedure Airway Equipment and Method: Bite block Placement Confirmation: positive ETCO2 Dental Injury: Teeth and Oropharynx as per pre-operative assessment        

## 2016-02-08 NOTE — Anesthesia Postprocedure Evaluation (Signed)
Anesthesia Post Note  Patient: Melinda [name]  Procedure(s) Performed: * No procedures listed *  Patient location during evaluation: PACU Anesthesia Type: General Level of consciousness: awake Pain management: pain level controlled Vital Signs Assessment: post-procedure vital signs reviewed and stable Respiratory status: spontaneous breathing Cardiovascular status: blood pressure returned to baseline Anesthetic complications: no    Last Vitals:  Filed Vitals:   01/26/16 1105 01/26/16 1111  BP: 105/56   Pulse: 89   Temp:    Resp: 8 18    Last Pain:  Filed Vitals:   01/26/16 1112  PainSc: 0-No pain                 VAN STAVEREN,Taariq Leitz

## 2016-02-08 NOTE — Addendum Note (Signed)
Addendum  created 02/08/16 1537 by Iver Nestle, MD   Modules edited: Clinical Notes   Clinical Notes:  File: [id number]

## 2016-02-15 ENCOUNTER — Other Ambulatory Visit: Payer: Self-pay | Admitting: Psychiatry

## 2016-02-16 ENCOUNTER — Encounter: Payer: Self-pay | Admitting: Anesthesiology

## 2016-02-16 ENCOUNTER — Encounter
Admission: RE | Admit: 2016-02-16 | Discharge: 2016-02-16 | Disposition: A | Discharge status: Home / Self Care | Payer: Medicare PPO | Source: Ambulatory Visit | Attending: Psychiatry | Admitting: Psychiatry

## 2016-02-16 DIAGNOSIS — I10 Essential (primary) hypertension: Secondary | ICD-10-CM | POA: Diagnosis not present

## 2016-02-16 DIAGNOSIS — Z6839 Body mass index (BMI) 39.0-39.9, adult: Secondary | ICD-10-CM | POA: Diagnosis not present

## 2016-02-16 DIAGNOSIS — F609 Personality disorder, unspecified: Secondary | ICD-10-CM | POA: Diagnosis not present

## 2016-02-16 DIAGNOSIS — E119 Type 2 diabetes mellitus without complications: Secondary | ICD-10-CM | POA: Insufficient documentation

## 2016-02-16 DIAGNOSIS — R000 Tachycardia, unspecified: Secondary | ICD-10-CM | POA: Diagnosis not present

## 2016-02-16 DIAGNOSIS — E7800 Pure hypercholesterolemia, unspecified: Secondary | ICD-10-CM | POA: Diagnosis not present

## 2016-02-16 DIAGNOSIS — Z8249 Family history of ischemic heart disease and other diseases of the circulatory system: Secondary | ICD-10-CM | POA: Insufficient documentation

## 2016-02-16 DIAGNOSIS — Z833 Family history of diabetes mellitus: Secondary | ICD-10-CM | POA: Diagnosis not present

## 2016-02-16 DIAGNOSIS — Z3202 Encounter for pregnancy test, result negative: Secondary | ICD-10-CM | POA: Diagnosis not present

## 2016-02-16 DIAGNOSIS — K219 Gastro-esophageal reflux disease without esophagitis: Secondary | ICD-10-CM | POA: Diagnosis not present

## 2016-02-16 DIAGNOSIS — E1142 Type 2 diabetes mellitus with diabetic polyneuropathy: Secondary | ICD-10-CM | POA: Diagnosis not present

## 2016-02-16 DIAGNOSIS — F332 Major depressive disorder, recurrent severe without psychotic features: Secondary | ICD-10-CM | POA: Insufficient documentation

## 2016-02-16 DIAGNOSIS — E669 Obesity, unspecified: Secondary | ICD-10-CM | POA: Insufficient documentation

## 2016-02-16 DIAGNOSIS — Z888 Allergy status to other drugs, medicaments and biological substances status: Secondary | ICD-10-CM | POA: Diagnosis not present

## 2016-02-16 LAB — GLUCOSE, CAPILLARY: Glucose-Capillary: 96 mg/dL (ref 65–99)

## 2016-02-16 LAB — POCT PREGNANCY, URINE: Preg Test, Ur: NEGATIVE

## 2016-02-16 MED ORDER — KETOROLAC TROMETHAMINE 30 MG/ML IJ SOLN
30.0000 mg | Freq: Once | INTRAMUSCULAR | Status: AC
  Administered 2016-02-16: 30 mg via INTRAVENOUS

## 2016-02-16 MED ORDER — SUCCINYLCHOLINE CHLORIDE 20 MG/ML IJ SOLN
INTRAMUSCULAR | Status: DC | PRN
  Administered 2016-02-16: 100 mg via INTRAVENOUS

## 2016-02-16 MED ORDER — SODIUM CHLORIDE 0.9 % IV SOLN
INTRAVENOUS | Status: DC | PRN
  Administered 2016-02-16: 10:00:00 via INTRAVENOUS

## 2016-02-16 MED ORDER — ESMOLOL HCL 100 MG/10ML IV SOLN
INTRAVENOUS | Status: DC | PRN
  Administered 2016-02-16: 10 mg via INTRAVENOUS

## 2016-02-16 MED ORDER — SODIUM CHLORIDE 0.9 % IV SOLN
250.0000 mL | Freq: Once | INTRAVENOUS | Status: AC
  Administered 2016-02-16: 500 mL via INTRAVENOUS

## 2016-02-16 MED ORDER — LABETALOL HCL 5 MG/ML IV SOLN
20.0000 mg | Freq: Once | INTRAVENOUS | Status: AC
  Administered 2016-02-16: 20 mg via INTRAVENOUS

## 2016-02-16 MED ORDER — GLYCOPYRROLATE 0.2 MG/ML IJ SOLN
0.4000 mg | Freq: Once | INTRAMUSCULAR | Status: AC
  Administered 2016-02-16: 0.4 mg via INTRAVENOUS

## 2016-02-16 MED ORDER — GLYCOPYRROLATE 0.2 MG/ML IJ SOLN
INTRAMUSCULAR | Status: AC
  Administered 2016-02-16: 0.4 mg via INTRAVENOUS
  Filled 2016-02-16: qty 2

## 2016-02-16 MED ORDER — METHOHEXITAL SODIUM 100 MG/10ML IV SOSY
PREFILLED_SYRINGE | INTRAVENOUS | Status: DC | PRN
  Administered 2016-02-16: 70 mg via INTRAVENOUS

## 2016-02-16 MED ORDER — ESMOLOL HCL-SODIUM CHLORIDE 2000 MG/100ML IV SOLN: 10.0000 mg | Freq: Once | INTRAVENOUS | Status: DC

## 2016-02-16 MED ORDER — KETOROLAC TROMETHAMINE 30 MG/ML IJ SOLN
INTRAMUSCULAR | Status: AC
  Administered 2016-02-16: 30 mg via INTRAVENOUS
  Filled 2016-02-16: qty 1

## 2016-02-16 NOTE — Transfer of Care (Signed)
Immediate Anesthesia Transfer of Care Note  Patient: Melinda [name]  Procedure(s) Performed: ECT  Patient Location: PACU  Anesthesia Type:General  Level of Consciousness: sedated  Airway & Oxygen Therapy: Patient Spontanous Breathing and Patient connected to face mask oxygen  Post-op Assessment: Report given to RN and Post -op Vital signs reviewed and stable  Post vital signs: stable  Last Vitals:  Filed Vitals:   02/16/16 0844 02/16/16 1050  BP: 131/65 116/90  Pulse: 88 95  Temp: 36.9 C 36.8 C  Resp: 18 18    Complications: No apparent anesthesia complications

## 2016-02-16 NOTE — H&P (Signed)
Melinda [name] is an 45 y.o. female.   Chief Complaint: Patient is feeling a little bit more down this week. A little more ruminative. No suicidal ideation no psychosis. No new physical complaints HPI: Long-standing recurrent severe depression. Does well with weekly ECT and medication management.  Past Medical History  Diagnosis Date  . Depression   . Diabetes mellitus without complication ([place])   . GERD (gastroesophageal reflux disease)   . Hypercholesterolemia 03/07/15  . Hypertension   . Obesity 03/07/15  . Personality disorder 03/07/15  . Sinus tachycardia ([place]) 03/07/15    history of  . Suicidal thoughts 03/07/15  . Diabetic peripheral neuropathy ([place]) 03/07/15  . Diabetic peripheral neuropathy ([place]) 03/07/15  . Diabetic peripheral neuropathy ([place]) 03/07/15    Past Surgical History  Procedure Laterality Date  . Electroconvulsion therapy  03/07/15    Family History  Problem Relation Age of Onset  . Hypertension Father   . Diabetes Mother    Social History:  reports that she has never smoked. She has never used smokeless tobacco. She reports that she does not drink alcohol or use illicit drugs.  Allergies:  Allergies  Allergen Reactions  . Prednisone     Increases blood sugar     (Not in a hospital admission)  Results for orders placed or performed during the hospital encounter of 02/16/16 (from the past 48 hour(s))  Pregnancy, urine POC     Status: None   Collection Time: 02/16/16  8:27 AM  Result Value Ref Range   Preg Test, Ur NEGATIVE NEGATIVE    Comment:        THE SENSITIVITY OF THIS METHODOLOGY IS >24 mIU/mL   Glucose, capillary     Status: None   Collection Time: 02/16/16  8:31 AM  Result Value Ref Range   Glucose-Capillary 96 65 - 99 mg/dL   Comment 1 Notify RN    No results found.  Review of Systems  Constitutional: Negative.   HENT: Negative.   Eyes: Negative.   Respiratory: Negative.   Cardiovascular: Negative.   Gastrointestinal: Negative.    Musculoskeletal: Negative.   Skin: Negative.   Neurological: Negative.   Psychiatric/Behavioral: Positive for depression. Negative for suicidal ideas, hallucinations, memory loss and substance abuse. The patient is nervous/anxious and has insomnia.     Blood pressure 131/65, pulse 88, temperature 98.5 F (36.9 C), temperature source Oral, resp. rate 18, weight 102.059 kg (225 lb), SpO2 100 %. Physical Exam  Nursing note and vitals reviewed. Constitutional: She appears well-developed and well-nourished.  HENT:  Head: Normocephalic and atraumatic.  Eyes: Conjunctivae are normal. Pupils are equal, round, and reactive to light.  Neck: Normal range of motion.  Cardiovascular: Normal heart sounds.   Respiratory: Effort normal.  GI: Soft.  Musculoskeletal: Normal range of motion.  Neurological: She is alert.  Skin: Skin is warm and dry.  Psychiatric: Judgment and thought content normal. Her speech is delayed. She is slowed. Cognition and memory are normal. She exhibits a depressed mood.     Assessment/Plan Patient a little bit more down after 2 week interval. Because of the holiday we will be not able to see her back until the 21st. We will get a chance to see how she is doing with 2 week interval treatment.  [name], MD 02/16/2016, 10:30 AM

## 2016-02-16 NOTE — Anesthesia Preprocedure Evaluation (Signed)
Anesthesia Evaluation  Patient identified by MRN, date of birth, ID band Patient awake    Reviewed: Allergy & Precautions, H&P , NPO status , Patient's Chart, lab work & pertinent test results, reviewed documented beta blocker date and time   Airway Mallampati: II   Neck ROM: full    Dental  (+) Poor Dentition   Pulmonary neg pulmonary ROS, sleep apnea ,    Pulmonary exam normal        Cardiovascular hypertension, negative cardio ROS Normal cardiovascular exam Rate:Normal     Neuro/Psych PSYCHIATRIC DISORDERS  Neuromuscular disease negative neurological ROS  negative psych ROS   GI/Hepatic negative GI ROS, Neg liver ROS, GERD  ,  Endo/Other  negative endocrine ROSdiabetes  Renal/GU negative Renal ROS  negative genitourinary   Musculoskeletal   Abdominal   Peds  Hematology negative hematology ROS (+)   Anesthesia Other Findings Past Medical History:   Depression                                                   Diabetes mellitus without complication ([place])                 GERD (gastroesophageal reflux disease)                       Hypercholesterolemia                            03/07/15      Hypertension                                                 Obesity                                         03/07/15      Personality disorder                            03/07/15      Sinus tachycardia ([place])                         03/07/15        Comment:history of   Suicidal thoughts                               03/07/15      Diabetic peripheral neuropathy ([place])            03/07/15      Diabetic peripheral neuropathy ([place])            03/07/15      Diabetic peripheral neuropathy ([place])            03/07/15    Past Surgical History:   electroconvulsion therapy                        03/07/15    BMI    Body Mass Index  37.44 kg/m 2     Reproductive/Obstetrics                             Anesthesia  Physical  Anesthesia Plan  ASA: III  Anesthesia Plan: General   Post-op Pain Management:    Induction:   Airway Management Planned:   Additional Equipment:   Intra-op Plan:   Post-operative Plan:   Informed Consent: I have reviewed the patients History and Physical, chart, labs and discussed the procedure including the risks, benefits and alternatives for the proposed anesthesia with the patient or authorized representative who has indicated his/her understanding and acceptance.   Dental Advisory Given  Plan Discussed with: CRNA  Anesthesia Plan Comments:         Anesthesia Quick Evaluation

## 2016-02-16 NOTE — Anesthesia Postprocedure Evaluation (Signed)
Anesthesia Post Note  Patient: Melinda [name]  Procedure(s) Performed: * No procedures listed *  Patient location during evaluation: PACU Anesthesia Type: General Level of consciousness: awake and alert Pain management: pain level controlled Vital Signs Assessment: post-procedure vital signs reviewed and stable Respiratory status: spontaneous breathing and respiratory function stable Cardiovascular status: stable Anesthetic complications: no    Last Vitals:  Filed Vitals:   02/16/16 1134 02/16/16 1138  BP:  129/63  Pulse: 80   Temp:    Resp: 18     Last Pain:  Filed Vitals:   02/16/16 1138  PainSc: 3                  Kimila Papaleo K

## 2016-02-16 NOTE — Procedures (Signed)
ECT SERVICES Physician's Interval Evaluation & Treatment Note  Patient Identification: Melinda [name] MRN:  [id number] Date of Evaluation:  02/16/2016 TX #: [id number]  MADRS:   MMSE:   P.E. Findings:  No change to physical exam. Lungs clear heart normal. Vitals normal. Blood sugar unremarkable.  Psychiatric Interval Note:  Mood a little bit more down and irritable not psychotic not suicidal no self injury  Subjective:  Patient is a 45 y.o. female seen for evaluation for Electroconvulsive Therapy. Slightly worse than usual.  Treatment Summary:   []   Right Unilateral             [x]  Bilateral   % Energy : 1.0 ms 35%   Impedance: 1480 ohms  Seizure Energy Index: 6055 V squared  Postictal Suppression Index: 75%  Seizure Concordance Index: 96%  Medications  Pre Shock: Toradol 30 mg, labetalol 20 mg, Robinul 0.4 mg, esmolol 10 mg, Brevital 70 mg, succinylcholine 100 mg  Post Shock:    Seizure Duration: 36 seconds by EMG, 69 seconds by EEG   Comments: Follow-up on the 21st because of holiday schedule   Lungs:  [x]   Clear to auscultation               []  Other:   Heart:    [x]   Regular rhythm             []  irregular rhythm    [x]   Previous H&P reviewed, patient examined and there are NO CHANGES                 []   Previous H&P reviewed, patient examined and there are changes noted.   [name], MD 4/7/201710:33 AM

## 2016-02-16 NOTE — Discharge Instructions (Addendum)
1)  The drugs that you have been given will stay in your system until tomorrow so for the       next 24 hours you should not:  A. Drive an automobile  B. Make any legal decisions  C. Drink any alcoholic beverages  2)  You may resume your regular meals upon return home.  3)  A responsible adult must take you home.  Someone should stay with you for a few          hours, then be available by phone for the remainder of the treatment day.  4)  You May experience any of the following symptoms:  Headache, Nausea and a dry mouth (due to the medications you were given),  temporary memory loss and some confusion, or sore muscles (a warm bath  should help this).  If you you experience any of these symptoms let us know on                your return visit.  5)  Report any of the following: any acute discomfort, severe headache, or temperature        greater than 100.5 F.   Also report any unusual redness, swelling, drainage, or pain         at your IV site.    You may report Symptoms to:  [place] [facility]          Phone: [phone], ECT Department           or Dr. Prescott Gum office [phone]  6)  Your next ECT Treatment is Day Friday   Date April 21,2017  We will call 2 days prior to your scheduled appointment for arrival times.  7)  Nothing to eat or drink after midnight the night before your procedure.  8)  Take Lisinopril    With a sip of water the morning of your procedure.  9)  Other Instructions: Call [phone] to cancel the morning of your procedure due         to illness or emergency.  10) We will call within 72 hours to assess how you are feeling.

## 2016-02-29 ENCOUNTER — Other Ambulatory Visit: Payer: Self-pay | Admitting: Psychiatry

## 2016-03-01 ENCOUNTER — Encounter: Payer: Self-pay | Admitting: Anesthesiology

## 2016-03-01 ENCOUNTER — Encounter (HOSPITAL_BASED_OUTPATIENT_CLINIC_OR_DEPARTMENT_OTHER)
Admission: RE | Admit: 2016-03-01 | Discharge: 2016-03-01 | Disposition: A | Discharge status: Home / Self Care | Payer: Medicare PPO | Source: Ambulatory Visit | Attending: Psychiatry | Admitting: Psychiatry

## 2016-03-01 DIAGNOSIS — E119 Type 2 diabetes mellitus without complications: Secondary | ICD-10-CM | POA: Diagnosis not present

## 2016-03-01 DIAGNOSIS — Z6839 Body mass index (BMI) 39.0-39.9, adult: Secondary | ICD-10-CM | POA: Diagnosis not present

## 2016-03-01 DIAGNOSIS — E669 Obesity, unspecified: Secondary | ICD-10-CM | POA: Diagnosis not present

## 2016-03-01 DIAGNOSIS — F609 Personality disorder, unspecified: Secondary | ICD-10-CM | POA: Diagnosis not present

## 2016-03-01 DIAGNOSIS — E7800 Pure hypercholesterolemia, unspecified: Secondary | ICD-10-CM | POA: Diagnosis not present

## 2016-03-01 DIAGNOSIS — F332 Major depressive disorder, recurrent severe without psychotic features: Secondary | ICD-10-CM

## 2016-03-01 DIAGNOSIS — R000 Tachycardia, unspecified: Secondary | ICD-10-CM | POA: Diagnosis not present

## 2016-03-01 DIAGNOSIS — I10 Essential (primary) hypertension: Secondary | ICD-10-CM | POA: Diagnosis not present

## 2016-03-01 DIAGNOSIS — K219 Gastro-esophageal reflux disease without esophagitis: Secondary | ICD-10-CM | POA: Diagnosis not present

## 2016-03-01 LAB — POCT PREGNANCY, URINE: Preg Test, Ur: NEGATIVE

## 2016-03-01 LAB — GLUCOSE, CAPILLARY: Glucose-Capillary: 93 mg/dL (ref 65–99)

## 2016-03-01 MED ORDER — SUCCINYLCHOLINE 20MG/ML (10ML) SYRINGE FOR MEDFUSION PUMP - OPTIME
INTRAMUSCULAR | Status: DC | PRN
  Administered 2016-03-01: 100 mg via INTRAVENOUS

## 2016-03-01 MED ORDER — GLYCOPYRROLATE 0.2 MG/ML IJ SOLN
0.4000 mg | Freq: Once | INTRAMUSCULAR | Status: AC
  Administered 2016-03-01: 0.4 mg via INTRAVENOUS

## 2016-03-01 MED ORDER — ESMOLOL HCL 100 MG/10ML IV SOLN
INTRAVENOUS | Status: DC | PRN
  Administered 2016-03-01: 10 mg via INTRAVENOUS

## 2016-03-01 MED ORDER — LABETALOL HCL 5 MG/ML IV SOLN
20.0000 mg | Freq: Once | INTRAVENOUS | Status: AC
  Administered 2016-03-01: 20 mg via INTRAVENOUS

## 2016-03-01 MED ORDER — FENTANYL CITRATE (PF) 100 MCG/2ML IJ SOLN
25.0000 ug | INTRAMUSCULAR | Status: DC | PRN
Start: 2016-03-01 — End: 2016-03-02

## 2016-03-01 MED ORDER — SODIUM CHLORIDE 0.9 % IV SOLN
250.0000 mL | Freq: Once | INTRAVENOUS | Status: AC
  Administered 2016-03-01: 500 mL via INTRAVENOUS

## 2016-03-01 MED ORDER — GLYCOPYRROLATE 0.2 MG/ML IJ SOLN
INTRAMUSCULAR | Status: AC
  Filled 2016-03-01: qty 1

## 2016-03-01 MED ORDER — SODIUM CHLORIDE 0.9 % IV SOLN
INTRAVENOUS | Status: DC | PRN
  Administered 2016-03-01: 10:00:00 via INTRAVENOUS

## 2016-03-01 MED ORDER — ONDANSETRON HCL 4 MG/2ML IJ SOLN: 4.0000 mg | Freq: Once | INTRAMUSCULAR | Status: DC | PRN

## 2016-03-01 MED ORDER — KETOROLAC TROMETHAMINE 30 MG/ML IJ SOLN
30.0000 mg | Freq: Once | INTRAMUSCULAR | Status: AC
  Administered 2016-03-01: 30 mg via INTRAVENOUS

## 2016-03-01 MED ORDER — LABETALOL HCL 5 MG/ML IV SOLN
INTRAVENOUS | Status: DC | PRN
  Administered 2016-03-01: 20 mg via INTRAVENOUS

## 2016-03-01 MED ORDER — ESMOLOL HCL-SODIUM CHLORIDE 2000 MG/100ML IV SOLN: 10.0000 mg | Freq: Once | INTRAVENOUS | Status: DC

## 2016-03-01 MED ORDER — GLYCOPYRROLATE 0.2 MG/ML IJ SOLN
INTRAMUSCULAR | Status: AC
  Administered 2016-03-01: 0.4 mg via INTRAVENOUS
  Filled 2016-03-01: qty 1

## 2016-03-01 MED ORDER — KETOROLAC TROMETHAMINE 30 MG/ML IJ SOLN
INTRAMUSCULAR | Status: AC
  Administered 2016-03-01: 30 mg via INTRAVENOUS
  Filled 2016-03-01: qty 1

## 2016-03-01 MED ORDER — METHOHEXITAL SODIUM 100 MG/10ML IV SOSY
PREFILLED_SYRINGE | INTRAVENOUS | Status: DC | PRN
  Administered 2016-03-01: 70 mg via INTRAVENOUS

## 2016-03-01 NOTE — H&P (Signed)
Melinda [name] is an 45 y.o. female.   Chief Complaint: T patient was more down last week. Had some thoughts about cutting herself but did not actually do it. Patient is feeling better this week. Getting along well with her son. Not suicidal not psychotic. HPI: Patient with severe chronic depression who does very well with weekly treatment gets worse when she misses her treatment  Past Medical History  Diagnosis Date  . Depression   . Diabetes mellitus without complication ([place])   . GERD (gastroesophageal reflux disease)   . Hypercholesterolemia 03/07/15  . Hypertension   . Obesity 03/07/15  . Personality disorder 03/07/15  . Sinus tachycardia ([place]) 03/07/15    history of  . Suicidal thoughts 03/07/15  . Diabetic peripheral neuropathy ([place]) 03/07/15  . Diabetic peripheral neuropathy ([place]) 03/07/15  . Diabetic peripheral neuropathy ([place]) 03/07/15    Past Surgical History  Procedure Laterality Date  . Electroconvulsion therapy  03/07/15    Family History  Problem Relation Age of Onset  . Hypertension Father   . Diabetes Mother    Social History:  reports that she has never smoked. She has never used smokeless tobacco. She reports that she does not drink alcohol or use illicit drugs.  Allergies:  Allergies  Allergen Reactions  . Prednisone     Increases blood sugar     (Not in a hospital admission)  Results for orders placed or performed during the hospital encounter of 03/01/16 (from the past 48 hour(s))  Glucose, capillary     Status: None   Collection Time: 03/01/16  8:08 AM  Result Value Ref Range   Glucose-Capillary 93 65 - 99 mg/dL   Comment 1 Notify RN   Pregnancy, urine POC     Status: None   Collection Time: 03/01/16  8:09 AM  Result Value Ref Range   Preg Test, Ur NEGATIVE NEGATIVE    Comment:        THE SENSITIVITY OF THIS METHODOLOGY IS >24 mIU/mL    No results found.  Review of Systems  Constitutional: Negative.   HENT: Negative.   Eyes: Negative.    Respiratory: Negative.   Cardiovascular: Negative.   Gastrointestinal: Negative.   Musculoskeletal: Negative.   Skin: Negative.   Neurological: Negative.   Psychiatric/Behavioral: Positive for depression. Negative for suicidal ideas, hallucinations, memory loss and substance abuse. The patient is not nervous/anxious and does not have insomnia.     Blood pressure 130/81, pulse 90, temperature 98.7 F (37.1 C), temperature source Tympanic, resp. rate 18, height 5\' 3"  (1.6 m), weight 102.059 kg (225 lb), SpO2 100 %. Physical Exam  Nursing note and vitals reviewed. Constitutional: She appears well-developed and well-nourished.  HENT:  Head: Normocephalic and atraumatic.  Eyes: Conjunctivae are normal. Pupils are equal, round, and reactive to light.  Neck: Normal range of motion.  Cardiovascular: Normal rate, regular rhythm and normal heart sounds.   Respiratory: Effort normal and breath sounds normal. No respiratory distress.  GI: Soft.  Musculoskeletal: Normal range of motion.  Neurological: She is alert.  Skin: Skin is warm and dry.  Psychiatric: She has a normal mood and affect. Her behavior is normal. Judgment and thought content normal.     Assessment/Plan Patient will be added with ECT today and follow-up next week no change to medicine supportive counseling review or plan.  [name], MD 03/01/2016, 10:17 AM

## 2016-03-01 NOTE — Anesthesia Procedure Notes (Signed)
Date/Time: 03/01/2016 10:25 AM Performed by: Dionne Bucy Pre-anesthesia Checklist: Patient identified, Emergency Drugs available, Suction available and Patient being monitored Patient Re-evaluated:Patient Re-evaluated prior to inductionOxygen Delivery Method: Circle system utilized Preoxygenation: Pre-oxygenation with 100% oxygen Intubation Type: IV induction Ventilation: Mask ventilation without difficulty and Mask ventilation throughout procedure Airway Equipment and Method: Bite block Placement Confirmation: positive ETCO2 Dental Injury: Teeth and Oropharynx as per pre-operative assessment

## 2016-03-01 NOTE — Procedures (Signed)
ECT SERVICES Physician's Interval Evaluation & Treatment Note  Patient Identification: Melinda [name] MRN:  [id number] Date of Evaluation:  03/01/2016 TX #: 221  MADRS:   MMSE:   P.E. Findings:  No change to physical exam. Vital signs stable. Heart and lungs normal.  Psychiatric Interval Note:  Mood is reportedly bad last week but today she is feeling better. I think the idea of getting ECT is reassuring to her. No current suicidal thoughts.  Subjective:  Patient is a 45 y.o. female seen for evaluation for Electroconvulsive Therapy. Overall it turns out she is actually doing better todayspecific new complaint  Treatment Summary:   []   Right Unilateral             [x]  Bilateral   % Energy : 1.0 ms 35%   Impedance: 1530 ohms  Seizure Energy Index: 6007 V squared  Postictal Suppression Index: 43%  Seizure Concordance Index: 98%  Medications  Pre Shock: Robinul 0.4 mg, labetalol 20 mg, esmolol 10 mg, Toradol 30 mg, Brevital 70 mg, succinylcholine 100 mg  Post Shock:    Seizure Duration: 40 seconds by EMG, 87 seconds by EEG   Comments: Follow-up one week   Lungs:  [x]   Clear to auscultation               []  Other:   Heart:    [x]   Regular rhythm             []  irregular rhythm    [x]   Previous H&P reviewed, patient examined and there are NO CHANGES                 []   Previous H&P reviewed, patient examined and there are changes noted.   Melinda Berthold, MD 4/21/201710:19 AM

## 2016-03-01 NOTE — Transfer of Care (Signed)
Immediate Anesthesia Transfer of Care Note  Patient: Melinda [name]  Procedure(s) Performed: ECT  Patient Location: PACU  Anesthesia Type:General  Level of Consciousness: awake and confused  Airway & Oxygen Therapy: Patient Spontanous Breathing and Patient connected to face mask oxygen  Post-op Assessment: Report given to RN  Post vital signs: Reviewed and stable  Last Vitals:  Filed Vitals:   03/01/16 0813  BP: 130/81  Pulse: 90  Temp: 37.1 C  Resp: 18    Complications: No apparent anesthesia complications

## 2016-03-01 NOTE — Anesthesia Preprocedure Evaluation (Addendum)
Anesthesia Evaluation  Patient identified by MRN, date of birth, ID band Patient awake    Reviewed: Allergy & Precautions, H&P , NPO status , Patient's Chart, lab work & pertinent test results, reviewed documented beta blocker date and time   Airway Mallampati: II   Neck ROM: full    Dental  (+) Poor Dentition   Pulmonary neg pulmonary ROS, sleep apnea ,    Pulmonary exam normal        Cardiovascular hypertension, Pt. on medications negative cardio ROS Normal cardiovascular exam Rate:Normal     Neuro/Psych PSYCHIATRIC DISORDERS Depression Bipolar Disorder  Neuromuscular disease negative neurological ROS  negative psych ROS   GI/Hepatic negative GI ROS, Neg liver ROS, GERD  ,  Endo/Other  negative endocrine ROSdiabetes  Renal/GU negative Renal ROS  negative genitourinary   Musculoskeletal   Abdominal   Peds  Hematology negative hematology ROS (+)   Anesthesia Other Findings Past Medical History:   Depression                                                   Diabetes mellitus without complication ([place])                 GERD (gastroesophageal reflux disease)                       Hypercholesterolemia                            03/07/15      Hypertension                                                 Obesity                                         03/07/15      Personality disorder                            03/07/15      Sinus tachycardia ([place])                         03/07/15        Comment:history of   Suicidal thoughts                               03/07/15      Diabetic peripheral neuropathy ([place])            03/07/15      Diabetic peripheral neuropathy ([place])            03/07/15      Diabetic peripheral neuropathy ([place])            03/07/15    Past Surgical History:   electroconvulsion therapy                        03/07/15    BMI  Body Mass Index   37.44 kg/m 2     Reproductive/Obstetrics                             Anesthesia Physical  Anesthesia Plan  ASA: III  Anesthesia Plan: General   Post-op Pain Management:    Induction:   Airway Management Planned:   Additional Equipment:   Intra-op Plan:   Post-operative Plan:   Informed Consent: I have reviewed the patients History and Physical, chart, labs and discussed the procedure including the risks, benefits and alternatives for the proposed anesthesia with the patient or authorized representative who has indicated his/her understanding and acceptance.   Dental Advisory Given  Plan Discussed with: CRNA  Anesthesia Plan Comments:         Anesthesia Quick Evaluation

## 2016-03-01 NOTE — Anesthesia Postprocedure Evaluation (Signed)
Anesthesia Post Note  Patient: [place] M Adderly  Procedure(s) Performed: * No procedures listed *  Patient location during evaluation: PACU Anesthesia Type: General Level of consciousness: awake and alert and oriented Pain management: pain level controlled Vital Signs Assessment: post-procedure vital signs reviewed and stable Respiratory status: spontaneous breathing Cardiovascular status: blood pressure returned to baseline Anesthetic complications: no    Last Vitals:  Filed Vitals:   03/01/16 1107 03/01/16 1117  BP: 119/60 126/70  Pulse: 95 88  Temp:    Resp: 20 20    Last Pain:  Filed Vitals:   03/01/16 1120  PainSc: 3                  Arihaan Bellucci

## 2016-03-01 NOTE — Discharge Instructions (Addendum)
1)  The drugs that you have been given will stay in your system until tomorrow so for the       next 24 hours you should not:  A. Drive an automobile  B. Make any legal decisions  C. Drink any alcoholic beverages  2)  You may resume your regular meals upon return home.  3)  A responsible adult must take you home.  Someone should stay with you for a few          hours, then be available by phone for the remainder of the treatment day.  4)  You May experience any of the following symptoms:  Headache, Nausea and a dry mouth (due to the medications you were given),  temporary memory loss and some confusion, or sore muscles (a warm bath  should help this).  If you you experience any of these symptoms let us know on                your return visit.  5)  Report any of the following: any acute discomfort, severe headache, or temperature        greater than 100.5 F.   Also report any unusual redness, swelling, drainage, or pain         at your IV site.    You may report Symptoms to:  [place] at [facility]          Phone: [phone], ECT Department           or Dr. Prescott Gum office [phone]  6)  Your next ECT Treatment is Day friday  Date April 28  We will call 2 days prior to your scheduled appointment for arrival times.  7)  Nothing to eat or drink after midnight the night before your procedure.  8)  Take lisinopril     With a sip of water the morning of your procedure.  9)  Other Instructions: Call [phone] to cancel the morning of your procedure due         to illness or emergency.  10) We will call within 72 hours to assess how you are feeling.

## 2016-03-07 ENCOUNTER — Other Ambulatory Visit: Payer: Self-pay | Admitting: Psychiatry

## 2016-03-08 ENCOUNTER — Encounter (HOSPITAL_BASED_OUTPATIENT_CLINIC_OR_DEPARTMENT_OTHER)
Admission: RE | Admit: 2016-03-08 | Discharge: 2016-03-08 | Disposition: A | Discharge status: Home / Self Care | Payer: Medicare PPO | Source: Ambulatory Visit | Attending: Psychiatry | Admitting: Psychiatry

## 2016-03-08 ENCOUNTER — Encounter: Payer: Self-pay | Admitting: Certified Registered"

## 2016-03-08 DIAGNOSIS — Z6839 Body mass index (BMI) 39.0-39.9, adult: Secondary | ICD-10-CM | POA: Diagnosis not present

## 2016-03-08 DIAGNOSIS — K219 Gastro-esophageal reflux disease without esophagitis: Secondary | ICD-10-CM | POA: Diagnosis not present

## 2016-03-08 DIAGNOSIS — E119 Type 2 diabetes mellitus without complications: Secondary | ICD-10-CM | POA: Diagnosis not present

## 2016-03-08 DIAGNOSIS — I10 Essential (primary) hypertension: Secondary | ICD-10-CM | POA: Diagnosis not present

## 2016-03-08 DIAGNOSIS — E669 Obesity, unspecified: Secondary | ICD-10-CM | POA: Diagnosis not present

## 2016-03-08 DIAGNOSIS — F332 Major depressive disorder, recurrent severe without psychotic features: Secondary | ICD-10-CM | POA: Diagnosis not present

## 2016-03-08 DIAGNOSIS — E7800 Pure hypercholesterolemia, unspecified: Secondary | ICD-10-CM | POA: Diagnosis not present

## 2016-03-08 DIAGNOSIS — R000 Tachycardia, unspecified: Secondary | ICD-10-CM | POA: Diagnosis not present

## 2016-03-08 DIAGNOSIS — F609 Personality disorder, unspecified: Secondary | ICD-10-CM | POA: Diagnosis not present

## 2016-03-08 LAB — GLUCOSE, CAPILLARY: Glucose-Capillary: 95 mg/dL (ref 65–99)

## 2016-03-08 MED ORDER — LABETALOL HCL 5 MG/ML IV SOLN: 20.0000 mg | Freq: Once | INTRAVENOUS | Status: DC

## 2016-03-08 MED ORDER — KETOROLAC TROMETHAMINE 30 MG/ML IJ SOLN
30.0000 mg | Freq: Once | INTRAMUSCULAR | Status: AC
  Administered 2016-03-08: 30 mg via INTRAVENOUS

## 2016-03-08 MED ORDER — LABETALOL HCL 5 MG/ML IV SOLN
INTRAVENOUS | Status: DC | PRN
  Administered 2016-03-08: 20 mg via INTRAVENOUS

## 2016-03-08 MED ORDER — SODIUM CHLORIDE 0.9 % IV SOLN
INTRAVENOUS | Status: DC | PRN
  Administered 2016-03-08: 09:00:00 via INTRAVENOUS

## 2016-03-08 MED ORDER — ESMOLOL HCL-SODIUM CHLORIDE 2000 MG/100ML IV SOLN
10.0000 mg | Freq: Once | INTRAVENOUS | Status: AC
  Administered 2016-03-08: 10 mg via INTRAVENOUS

## 2016-03-08 MED ORDER — GLYCOPYRROLATE 0.2 MG/ML IJ SOLN
0.4000 mg | Freq: Once | INTRAMUSCULAR | Status: AC
  Administered 2016-03-08: 0.4 mg via INTRAVENOUS

## 2016-03-08 MED ORDER — FENTANYL CITRATE (PF) 100 MCG/2ML IJ SOLN: 25.0000 ug | INTRAMUSCULAR | Status: DC | PRN

## 2016-03-08 MED ORDER — ONDANSETRON HCL 4 MG/2ML IJ SOLN: 4.0000 mg | Freq: Once | INTRAMUSCULAR | Status: DC | PRN

## 2016-03-08 MED ORDER — SODIUM CHLORIDE 0.9 % IV SOLN
250.0000 mL | Freq: Once | INTRAVENOUS | Status: AC
  Administered 2016-03-08: 500 mL via INTRAVENOUS

## 2016-03-08 MED ORDER — METHOHEXITAL SODIUM 100 MG/10ML IV SOSY
PREFILLED_SYRINGE | INTRAVENOUS | Status: DC | PRN
  Administered 2016-03-08: 70 mg via INTRAVENOUS

## 2016-03-08 MED ORDER — SUCCINYLCHOLINE CHLORIDE 20 MG/ML IJ SOLN
INTRAMUSCULAR | Status: DC | PRN
  Administered 2016-03-08: 100 mg via INTRAVENOUS

## 2016-03-08 NOTE — Procedures (Signed)
ECT SERVICES Physician's Interval Evaluation & Treatment Note  Patient Identification: Melinda [name] MRN:  [id number] Date of Evaluation:  03/08/2016 TX #: [id number]  MADRS: 15  MMSE: 29  P.E. Findings:  No change to physical exam vital stable  Psychiatric Interval Note:  Mood is slightly irritable and anxious not depressed no suicidal ideation  Subjective:  Patient is a 45 y.o. female seen for evaluation for Electroconvulsive Therapy. No specific new complaint  Treatment Summary:   []   Right Unilateral             [x]  Bilateral   % Energy : 1.0 ms 35%   Impedance: 1060 ohms  Seizure Energy Index: 10,931 V squared  Postictal Suppression Index: 83%  Seizure Concordance Index: 86%  Medications  Pre Shock: Robinul 0.4 mg, labetalol 20 mg, esmolol 10 mg, Toradol 30 mg, Brevital 70 mg, succinylcholine 100 mg  Post Shock:    Seizure Duration: 54 seconds by EMG, 92 seconds by EEG   Comments: Follow-up in 1 week   Lungs:  [x]   Clear to auscultation               []  Other:   Heart:    [x]   Regular rhythm             []  irregular rhythm    [x]   Previous H&P reviewed, patient examined and there are NO CHANGES                 []   Previous H&P reviewed, patient examined and there are changes noted.   [name], MD 4/28/201710:24 AM

## 2016-03-08 NOTE — Transfer of Care (Signed)
Immediate Anesthesia Transfer of Care Note  Patient: Melinda [name]  Procedure(s) Performed: * No procedures listed *  Patient Location: PACU  Anesthesia Type:General  Level of Consciousness: awake and alert   Airway & Oxygen Therapy: Patient Spontanous Breathing and Patient connected to face mask oxygen  Post-op Assessment: Report given to RN and Post -op Vital signs reviewed and stable  Post vital signs: Reviewed and stable  Last Vitals:  Filed Vitals:   03/08/16 0818  BP: 130/87  Pulse: 83  Temp: 35.9 C  Resp: 18    Last Pain: There were no vitals filed for this visit.       Complications: No apparent anesthesia complications

## 2016-03-08 NOTE — Anesthesia Postprocedure Evaluation (Signed)
Anesthesia Post Note  Patient: Melinda [name]  Procedure(s) Performed: * No procedures listed *  Patient location during evaluation: PACU Anesthesia Type: General Level of consciousness: awake and alert Pain management: pain level controlled Vital Signs Assessment: post-procedure vital signs reviewed and stable Respiratory status: spontaneous breathing, nonlabored ventilation, respiratory function stable and patient connected to nasal cannula oxygen Cardiovascular status: blood pressure returned to baseline and stable Postop Assessment: no signs of nausea or vomiting Anesthetic complications: no    Last Vitals:  Filed Vitals:   03/08/16 1100 03/08/16 1114  BP: 100/71 108/77  Pulse: 92 85  Temp:    Resp: 17 18    Last Pain:  Filed Vitals:   03/08/16 1118  PainSc: 0-No pain                 Conor Filsaime S

## 2016-03-08 NOTE — H&P (Signed)
Melinda [name] is an 45 y.o. female.   Chief Complaint: Patient reports mood is fairly stable a little bit of anxiety and irritability no suicidal ideation HPI: Chronic depression responding well to ECT.  Past Medical History  Diagnosis Date  . Depression   . Diabetes mellitus without complication ([place])   . GERD (gastroesophageal reflux disease)   . Hypercholesterolemia 03/07/15  . Hypertension   . Obesity 03/07/15  . Personality disorder 03/07/15  . Sinus tachycardia ([place]) 03/07/15    history of  . Suicidal thoughts 03/07/15  . Diabetic peripheral neuropathy ([place]) 03/07/15  . Diabetic peripheral neuropathy ([place]) 03/07/15  . Diabetic peripheral neuropathy ([place]) 03/07/15    Past Surgical History  Procedure Laterality Date  . Electroconvulsion therapy  03/07/15    Family History  Problem Relation Age of Onset  . Hypertension Father   . Diabetes Mother    Social History:  reports that she has never smoked. She has never used smokeless tobacco. She reports that she does not drink alcohol or use illicit drugs.  Allergies:  Allergies  Allergen Reactions  . Prednisone     Increases blood sugar     (Not in a hospital admission)  Results for orders placed or performed during the hospital encounter of 03/01/16 (from the past 48 hour(s))  Glucose, capillary     Status: None   Collection Time: 03/08/16  8:44 AM  Result Value Ref Range   Glucose-Capillary 95 65 - 99 mg/dL   No results found.  Review of Systems  Constitutional: Negative.   HENT: Negative.   Eyes: Negative.   Respiratory: Negative.   Cardiovascular: Negative.   Gastrointestinal: Negative.   Musculoskeletal: Negative.   Skin: Negative.   Neurological: Negative.   Psychiatric/Behavioral: Negative for depression, suicidal ideas, hallucinations, memory loss and substance abuse. The patient is nervous/anxious. The patient does not have insomnia.     Blood pressure 130/87, pulse 83, temperature 96.7 F (35.9 C),  temperature source Oral, resp. rate 18, height 5\' 3"  (1.6 m), weight 102.059 kg (225 lb), SpO2 100 %. Physical Exam  Nursing note and vitals reviewed. Constitutional: She appears well-developed and well-nourished.  HENT:  Head: Normocephalic and atraumatic.  Eyes: Conjunctivae are normal. Pupils are equal, round, and reactive to light.  Neck: Normal range of motion.  Cardiovascular: Normal rate, regular rhythm and normal heart sounds.   Respiratory: Effort normal and breath sounds normal. No respiratory distress.  GI: Soft.  Musculoskeletal: Normal range of motion.  Neurological: She is alert.  Skin: Skin is warm and dry.  Psychiatric: She has a normal mood and affect. Her behavior is normal. Judgment and thought content normal.     Assessment/Plan Treatment today and then follow-up one week on Friday.  [name], MD 03/08/2016, 10:22 AM

## 2016-03-08 NOTE — Anesthesia Preprocedure Evaluation (Signed)
Anesthesia Evaluation  Patient identified by MRN, date of birth, ID band Patient awake    Reviewed: Allergy & Precautions, NPO status , Patient's Chart, lab work & pertinent test results, reviewed documented beta blocker date and time   Airway Mallampati: II  TM Distance: >3 FB     Dental  (+) Chipped   Pulmonary sleep apnea ,           Cardiovascular hypertension, Pt. on medications and Pt. on home beta blockers      Neuro/Psych PSYCHIATRIC DISORDERS Depression Bipolar Disorder  Neuromuscular disease    GI/Hepatic GERD  Controlled,  Endo/Other  diabetes, Type 2Morbid obesity  Renal/GU      Musculoskeletal   Abdominal   Peds  Hematology   Anesthesia Other Findings   Reproductive/Obstetrics                             Anesthesia Physical Anesthesia Plan  ASA: III  Anesthesia Plan: General   Post-op Pain Management:    Induction: Intravenous  Airway Management Planned: Mask  Additional Equipment:   Intra-op Plan:   Post-operative Plan:   Informed Consent: I have reviewed the patients History and Physical, chart, labs and discussed the procedure including the risks, benefits and alternatives for the proposed anesthesia with the patient or authorized representative who has indicated his/her understanding and acceptance.     Plan Discussed with: CRNA  Anesthesia Plan Comments:         Anesthesia Quick Evaluation

## 2016-03-08 NOTE — Discharge Instructions (Addendum)
1)  The drugs that you have been given will stay in your system until tomorrow so for the       next 24 hours you should not:  A. Drive an automobile  B. Make any legal decisions  C. Drink any alcoholic beverages  2)  You may resume your regular meals upon return home.  3)  A responsible adult must take you home.  Someone should stay with you for a few          hours, then be available by phone for the remainder of the treatment day.  4)  You May experience any of the following symptoms:  Headache, Nausea and a dry mouth (due to the medications you were given),  temporary memory loss and some confusion, or sore muscles (a warm bath  should help this).  If you you experience any of these symptoms let us know on                your return visit.  5)  Report any of the following: any acute discomfort, severe headache, or temperature        greater than 100.5 F.   Also report any unusual redness, swelling, drainage, or pain         at your IV site.    You may report Symptoms to:  [place] at [facility]          Phone: [phone], ECT Department           or Dr. Prescott Gum office [phone]  6)  Your next ECT Treatment is Day Friday  Date May 5th   We will call 2 days prior to your scheduled appointment for arrival times.  7)  Nothing to eat or drink after midnight the night before your procedure.  8)  Take lisonopril     With a sip of water the morning of your procedure.  9)  Other Instructions: Call [phone] to cancel the morning of your procedure due         to illness or emergency.  10) We will call within 72 hours to assess how you are feeling.

## 2016-03-10 LAB — POCT PREGNANCY, URINE: Preg Test, Ur: NEGATIVE

## 2016-03-11 DIAGNOSIS — E119 Type 2 diabetes mellitus without complications: Secondary | ICD-10-CM | POA: Diagnosis not present

## 2016-03-14 ENCOUNTER — Other Ambulatory Visit: Payer: Self-pay | Admitting: Psychiatry

## 2016-03-15 ENCOUNTER — Encounter: Payer: Self-pay | Admitting: Anesthesiology

## 2016-03-15 ENCOUNTER — Encounter
Admission: RE | Admit: 2016-03-15 | Discharge: 2016-03-15 | Disposition: A | Discharge status: Home / Self Care | Payer: Medicare PPO | Source: Ambulatory Visit | Attending: Psychiatry | Admitting: Psychiatry

## 2016-03-15 DIAGNOSIS — E119 Type 2 diabetes mellitus without complications: Secondary | ICD-10-CM | POA: Diagnosis not present

## 2016-03-15 DIAGNOSIS — I10 Essential (primary) hypertension: Secondary | ICD-10-CM | POA: Insufficient documentation

## 2016-03-15 DIAGNOSIS — E669 Obesity, unspecified: Secondary | ICD-10-CM | POA: Insufficient documentation

## 2016-03-15 DIAGNOSIS — E1142 Type 2 diabetes mellitus with diabetic polyneuropathy: Secondary | ICD-10-CM | POA: Diagnosis not present

## 2016-03-15 DIAGNOSIS — F332 Major depressive disorder, recurrent severe without psychotic features: Secondary | ICD-10-CM | POA: Diagnosis not present

## 2016-03-15 DIAGNOSIS — E7800 Pure hypercholesterolemia, unspecified: Secondary | ICD-10-CM | POA: Insufficient documentation

## 2016-03-15 DIAGNOSIS — K219 Gastro-esophageal reflux disease without esophagitis: Secondary | ICD-10-CM | POA: Diagnosis not present

## 2016-03-15 LAB — GLUCOSE, CAPILLARY: Glucose-Capillary: 86 mg/dL (ref 65–99)

## 2016-03-15 LAB — POCT PREGNANCY, URINE: Preg Test, Ur: NEGATIVE

## 2016-03-15 MED ORDER — METHOHEXITAL SODIUM 100 MG/10ML IV SOSY
PREFILLED_SYRINGE | INTRAVENOUS | Status: DC | PRN
  Administered 2016-03-15: 70 mg via INTRAVENOUS

## 2016-03-15 MED ORDER — LABETALOL HCL 5 MG/ML IV SOLN
20.0000 mg | Freq: Once | INTRAVENOUS | Status: AC
  Administered 2016-03-15: 10 mg via INTRAVENOUS

## 2016-03-15 MED ORDER — SODIUM CHLORIDE 0.9 % IV SOLN
250.0000 mL | Freq: Once | INTRAVENOUS | Status: AC
  Administered 2016-03-15: 500 mL via INTRAVENOUS

## 2016-03-15 MED ORDER — SODIUM CHLORIDE 0.9 % IV SOLN
INTRAVENOUS | Status: DC | PRN
Start: 2016-03-15 — End: 2016-03-15
  Administered 2016-03-15: 09:00:00 via INTRAVENOUS

## 2016-03-15 MED ORDER — KETOROLAC TROMETHAMINE 30 MG/ML IJ SOLN
30.0000 mg | Freq: Once | INTRAMUSCULAR | Status: AC
  Administered 2016-03-15: 30 mg via INTRAVENOUS

## 2016-03-15 MED ORDER — LABETALOL HCL 5 MG/ML IV SOLN
5.0000 mg | INTRAVENOUS | Status: DC | PRN
  Administered 2016-03-15: 5 mg via INTRAVENOUS

## 2016-03-15 MED ORDER — GLYCOPYRROLATE 0.2 MG/ML IJ SOLN
0.4000 mg | Freq: Once | INTRAMUSCULAR | Status: AC
  Administered 2016-03-15: 0.4 mg via INTRAVENOUS

## 2016-03-15 MED ORDER — GLYCOPYRROLATE 0.2 MG/ML IJ SOLN
INTRAMUSCULAR | Status: AC
  Administered 2016-03-15: 0.4 mg via INTRAVENOUS
  Filled 2016-03-15: qty 2

## 2016-03-15 MED ORDER — ESMOLOL HCL-SODIUM CHLORIDE 2000 MG/100ML IV SOLN: 10.0000 mg | Freq: Once | INTRAVENOUS | Status: DC

## 2016-03-15 MED ORDER — KETOROLAC TROMETHAMINE 30 MG/ML IJ SOLN
INTRAMUSCULAR | Status: AC
  Administered 2016-03-15: 30 mg via INTRAVENOUS
  Filled 2016-03-15: qty 1

## 2016-03-15 MED ORDER — SUCCINYLCHOLINE CHLORIDE 20 MG/ML IJ SOLN
INTRAMUSCULAR | Status: DC | PRN
  Administered 2016-03-15: 100 mg via INTRAVENOUS

## 2016-03-15 NOTE — Anesthesia Postprocedure Evaluation (Signed)
Anesthesia Post Note  Patient: Melinda [name]  Procedure(s) Performed: * No procedures listed *  Patient location during evaluation: PACU Anesthesia Type: General Level of consciousness: awake and alert Pain management: pain level controlled Vital Signs Assessment: post-procedure vital signs reviewed and stable Respiratory status: spontaneous breathing, nonlabored ventilation, respiratory function stable and patient connected to nasal cannula oxygen Cardiovascular status: blood pressure returned to baseline and stable Postop Assessment: no signs of nausea or vomiting Anesthetic complications: no    Last Vitals:  Filed Vitals:   03/15/16 1059 03/15/16 1112  BP: 115/70 107/50  Pulse: 81 79  Temp: 36.7 C   Resp: 22 18    Last Pain: There were no vitals filed for this visit.               Precious Haws Gilad Dugger

## 2016-03-15 NOTE — Transfer of Care (Signed)
Immediate Anesthesia Transfer of Care Note  Patient: Melinda [name]  Procedure(s) Performed: * No procedures listed *  Patient Location: PACU  Anesthesia Type:General  Level of Consciousness: awake and alert   Airway & Oxygen Therapy: Patient Spontanous Breathing and Patient connected to face mask oxygen  Post-op Assessment: Report given to RN and Post -op Vital signs reviewed and stable  Post vital signs: Reviewed and stable  Last Vitals:  Filed Vitals:   03/15/16 0814 03/15/16 1023  BP: 136/67   Pulse: 92 58  Temp: 36.9 C   Resp: 18 16    Last Pain: There were no vitals filed for this visit.       Complications: No apparent anesthesia complications

## 2016-03-15 NOTE — H&P (Signed)
Melinda [name] is an 45 y.o. female.   Chief Complaint: Today she is feeling good. No new complaint. Saw her psychiatrist this week. No change to medicine. No suicidal thoughts or psychosis. HPI: Long-standing depression responsive and stable with current ECT schedule.  Past Medical History  Diagnosis Date  . Depression   . Diabetes mellitus without complication ([place])   . GERD (gastroesophageal reflux disease)   . Hypercholesterolemia 03/07/15  . Hypertension   . Obesity 03/07/15  . Personality disorder 03/07/15  . Sinus tachycardia ([place]) 03/07/15    history of  . Suicidal thoughts 03/07/15  . Diabetic peripheral neuropathy ([place]) 03/07/15  . Diabetic peripheral neuropathy ([place]) 03/07/15  . Diabetic peripheral neuropathy ([place]) 03/07/15    Past Surgical History  Procedure Laterality Date  . Electroconvulsion therapy  03/07/15    Family History  Problem Relation Age of Onset  . Hypertension Father   . Diabetes Mother    Social History:  reports that she has never smoked. She has never used smokeless tobacco. She reports that she does not drink alcohol or use illicit drugs.  Allergies:  Allergies  Allergen Reactions  . Prednisone     Increases blood sugar     (Not in a hospital admission)  Results for orders placed or performed during the hospital encounter of 03/15/16 (from the past 48 hour(s))  Pregnancy, urine POC     Status: None   Collection Time: 03/15/16  8:15 AM  Result Value Ref Range   Preg Test, Ur NEGATIVE NEGATIVE    Comment:        THE SENSITIVITY OF THIS METHODOLOGY IS >24 mIU/mL   Glucose, capillary     Status: None   Collection Time: 03/15/16  8:20 AM  Result Value Ref Range   Glucose-Capillary 86 65 - 99 mg/dL   No results found.  Review of Systems  Constitutional: Negative.   HENT: Negative.   Eyes: Negative.   Respiratory: Negative.   Cardiovascular: Negative.   Gastrointestinal: Negative.   Musculoskeletal: Negative.   Skin: Negative.    Neurological: Negative.   Psychiatric/Behavioral: Negative for depression, suicidal ideas, hallucinations, memory loss and substance abuse. The patient is not nervous/anxious and does not have insomnia.     Blood pressure 136/67, pulse 92, temperature 98.4 F (36.9 C), temperature source Oral, resp. rate 18, height 5\' 3"  (1.6 m), weight 102.967 kg (227 lb), SpO2 99 %. Physical Exam  Nursing note and vitals reviewed. Constitutional: She appears well-developed and well-nourished.  HENT:  Head: Normocephalic and atraumatic.  Eyes: Conjunctivae are normal. Pupils are equal, round, and reactive to light.  Neck: Normal range of motion.  Cardiovascular: Normal rate, regular rhythm and normal heart sounds.   Respiratory: Effort normal and breath sounds normal. No respiratory distress.  GI: Soft.  Musculoskeletal: Normal range of motion.  Neurological: She is alert.  Skin: Skin is warm and dry.  Psychiatric: She has a normal mood and affect. Her behavior is normal. Judgment and thought content normal.     Assessment/Plan Follow-up next week continue current medicine change to overall plan.  [name], MD 03/15/2016, 10:06 AM

## 2016-03-15 NOTE — Anesthesia Preprocedure Evaluation (Addendum)
Anesthesia Evaluation  Patient identified by MRN, date of birth, ID band Patient awake    Reviewed: Allergy & Precautions, H&P , NPO status , Patient's Chart, lab work & pertinent test results, reviewed documented beta blocker date and time   History of Anesthesia Complications Negative for: history of anesthetic complications  Airway Mallampati: II  TM Distance: >3 FB     Dental  (+) Chipped   Pulmonary sleep apnea ,           Cardiovascular hypertension, Pt. on medications and Pt. on home beta blockers      Neuro/Psych PSYCHIATRIC DISORDERS Depression Bipolar Disorder  Neuromuscular disease    GI/Hepatic GERD  Controlled,  Endo/Other  diabetes, Type 2Morbid obesity  Renal/GU      Musculoskeletal   Abdominal   Peds  Hematology   Anesthesia Other Findings Past Medical History:   Depression                                                   Diabetes mellitus without complication ([place])                 GERD (gastroesophageal reflux disease)                       Hypercholesterolemia                            03/07/15      Hypertension                                                 Obesity                                         03/07/15      Personality disorder                            03/07/15      Sinus tachycardia ([place])                         03/07/15        Comment:history of   Suicidal thoughts                               03/07/15      Diabetic peripheral neuropathy ([place])            03/07/15      Diabetic peripheral neuropathy ([place])            03/07/15      Diabetic peripheral neuropathy ([place])            03/07/15     Past Surgical History:   electroconvulsion therapy                        03/07/15     BMI    Body Mass Index   40.22 kg/m 2  Reproductive/Obstetrics                            Anesthesia Physical  Anesthesia Plan  ASA: III  Anesthesia Plan: General    Post-op Pain Management:    Induction: Intravenous  Airway Management Planned: Mask  Additional Equipment:   Intra-op Plan:   Post-operative Plan:   Informed Consent: I have reviewed the patients History and Physical, chart, labs and discussed the procedure including the risks, benefits and alternatives for the proposed anesthesia with the patient or authorized representative who has indicated his/her understanding and acceptance.     Plan Discussed with: CRNA  Anesthesia Plan Comments:         Anesthesia Quick Evaluation

## 2016-03-15 NOTE — Procedures (Signed)
ECT SERVICES Physician's Interval Evaluation & Treatment Note  Patient Identification: AZARIE MARGOLIS MRN:  [id number] Date of Evaluation:  03/15/2016 TX #: [id number]  MADRS:   MMSE:   P.E. Findings:  No change to physical exam. Vitals normal.  Psychiatric Interval Note:  Mood stable no active suicidal thoughts  Subjective:  Patient is a 45 y.o. female seen for evaluation for Electroconvulsive Therapy. No new complaint  Treatment Summary:   [x]   Right Unilateral             [x]  Bilateral   % Energy : 1.0 ms 35%   Impedance: 1180 ohms  Seizure Energy Index: 5478 V squared  Postictal Suppression Index: 68%  Seizure Concordance Index: 98%  Medications  Pre Shock: Robinul 0.4 mg, Toradol 30 mg, labetalol 20 mg, Brevital 70 mg, succinylcholine 100 mg  Post Shock: None  Seizure Duration: 55 seconds by EMG, 106 seconds by EEG   Comments: Follow-up one week   Lungs:  [x]   Clear to auscultation               []  Other:   Heart:    [x]   Regular rhythm             []  irregular rhythm    [x]   Previous H&P reviewed, patient examined and there are NO CHANGES                 []   Previous H&P reviewed, patient examined and there are changes noted.   [name], MD 5/5/201710:07 AM

## 2016-03-15 NOTE — Discharge Instructions (Addendum)
1)  The drugs that you have been given will stay in your system until tomorrow so for the       next 24 hours you should not:  A. Drive an automobile  B. Make any legal decisions  C. Drink any alcoholic beverages  2)  You may resume your regular meals upon return home.  3)  A responsible adult must take you home.  Someone should stay with you for a few          hours, then be available by phone for the remainder of the treatment day.  4)  You May experience any of the following symptoms:  Headache, Nausea and a dry mouth (due to the medications you were given),  temporary memory loss and some confusion, or sore muscles (a warm bath  should help this).  If you you experience any of these symptoms let us know on                your return visit.  5)  Report any of the following: any acute discomfort, severe headache, or temperature        greater than 100.5 F.   Also report any unusual redness, swelling, drainage, or pain         at your IV site.    You may report Symptoms to:  [place] at [facility]          Phone: [phone], ECT Department           or Dr. Prescott Gum office [phone]  6)  Your next ECT Treatment is Friday, Mar 22, 2016  We will call 2 days prior to your scheduled appointment for arrival times.  7)  Nothing to eat or drink after midnight the night before your procedure.  8)  Take lisinopril with a sip of water the morning of your procedure.  9)  Other Instructions: Call [phone] to cancel the morning of your procedure due         to illness or emergency.  10) We will call within 72 hours to assess how you are feeling.

## 2016-03-18 DIAGNOSIS — E119 Type 2 diabetes mellitus without complications: Secondary | ICD-10-CM | POA: Diagnosis not present

## 2016-03-21 ENCOUNTER — Other Ambulatory Visit: Payer: Self-pay | Admitting: Psychiatry

## 2016-03-22 ENCOUNTER — Encounter: Payer: Self-pay | Admitting: Anesthesiology

## 2016-03-22 ENCOUNTER — Encounter (HOSPITAL_BASED_OUTPATIENT_CLINIC_OR_DEPARTMENT_OTHER)
Admission: RE | Admit: 2016-03-22 | Discharge: 2016-03-22 | Disposition: A | Discharge status: Home / Self Care | Payer: Medicare PPO | Source: Ambulatory Visit | Attending: Psychiatry | Admitting: Psychiatry

## 2016-03-22 DIAGNOSIS — I10 Essential (primary) hypertension: Secondary | ICD-10-CM | POA: Diagnosis not present

## 2016-03-22 DIAGNOSIS — E119 Type 2 diabetes mellitus without complications: Secondary | ICD-10-CM | POA: Diagnosis not present

## 2016-03-22 DIAGNOSIS — K219 Gastro-esophageal reflux disease without esophagitis: Secondary | ICD-10-CM | POA: Diagnosis not present

## 2016-03-22 DIAGNOSIS — E7800 Pure hypercholesterolemia, unspecified: Secondary | ICD-10-CM | POA: Diagnosis not present

## 2016-03-22 DIAGNOSIS — E669 Obesity, unspecified: Secondary | ICD-10-CM | POA: Diagnosis not present

## 2016-03-22 DIAGNOSIS — E1142 Type 2 diabetes mellitus with diabetic polyneuropathy: Secondary | ICD-10-CM | POA: Diagnosis not present

## 2016-03-22 DIAGNOSIS — F332 Major depressive disorder, recurrent severe without psychotic features: Secondary | ICD-10-CM

## 2016-03-22 LAB — POCT PREGNANCY, URINE: Preg Test, Ur: NEGATIVE

## 2016-03-22 LAB — GLUCOSE, CAPILLARY: Glucose-Capillary: 88 mg/dL (ref 65–99)

## 2016-03-22 MED ORDER — KETOROLAC TROMETHAMINE 30 MG/ML IJ SOLN
INTRAMUSCULAR | Status: AC
  Filled 2016-03-22: qty 1

## 2016-03-22 MED ORDER — LABETALOL HCL 5 MG/ML IV SOLN: 20.0000 mg | Freq: Once | INTRAVENOUS | Status: DC

## 2016-03-22 MED ORDER — GLYCOPYRROLATE 0.2 MG/ML IJ SOLN
INTRAMUSCULAR | Status: AC
  Administered 2016-03-22: 0.4 mg via INTRAVENOUS
  Filled 2016-03-22: qty 2

## 2016-03-22 MED ORDER — METHOHEXITAL SODIUM 100 MG/10ML IV SOSY
PREFILLED_SYRINGE | INTRAVENOUS | Status: DC | PRN
  Administered 2016-03-22: 70 mg via INTRAVENOUS

## 2016-03-22 MED ORDER — KETOROLAC TROMETHAMINE 30 MG/ML IJ SOLN
30.0000 mg | Freq: Once | INTRAMUSCULAR | Status: AC
  Administered 2016-03-22: 30 mg via INTRAVENOUS

## 2016-03-22 MED ORDER — SUCCINYLCHOLINE 20MG/ML (10ML) SYRINGE FOR MEDFUSION PUMP - OPTIME
INTRAMUSCULAR | Status: DC | PRN
  Administered 2016-03-22: 100 mg via INTRAVENOUS

## 2016-03-22 MED ORDER — ESMOLOL HCL-SODIUM CHLORIDE 2000 MG/100ML IV SOLN: 10.0000 mg | Freq: Once | INTRAVENOUS | Status: DC

## 2016-03-22 MED ORDER — SODIUM CHLORIDE 0.9 % IV SOLN
250.0000 mL | Freq: Once | INTRAVENOUS | Status: AC
  Administered 2016-03-22: 500 mL via INTRAVENOUS

## 2016-03-22 MED ORDER — GLYCOPYRROLATE 0.2 MG/ML IJ SOLN
0.4000 mg | Freq: Once | INTRAMUSCULAR | Status: AC
  Administered 2016-03-22: 0.4 mg via INTRAVENOUS

## 2016-03-22 MED ORDER — SODIUM CHLORIDE 0.9 % IV SOLN
INTRAVENOUS | Status: DC | PRN
  Administered 2016-03-22: 10:00:00 via INTRAVENOUS

## 2016-03-22 MED ORDER — LABETALOL HCL 5 MG/ML IV SOLN
INTRAVENOUS | Status: DC | PRN
  Administered 2016-03-22: 20 mg via INTRAVENOUS

## 2016-03-22 NOTE — Procedures (Signed)
ECT SERVICES Physician's Interval Evaluation & Treatment Note  Patient Identification: Melinda [name] MRN:  [id number] Date of Evaluation:  03/22/2016 TX #: [id number]  MADRS:   MMSE:   P.E. Findings:  No change to physical exam. Blood sugars continue to be well controlled vitals normal.  Psychiatric Interval Note:  Mood is stable no return of depression no talk about self injury  Subjective:  Patient is a 45 y.o. female seen for evaluation for Electroconvulsive Therapy. No new complaint  Treatment Summary:   []   Right Unilateral             [x]  Bilateral   % Energy : 1.0 ms, 35%   Impedance: 790 ohms  Seizure Energy Index: No reading obtained  Postictal Suppression Index: No reading obtained  Seizure Concordance Index: 94%  Medications  Pre Shock: Robinul 0.4 mg, labetalol 20 mg, Brevital 70 mg, Toradol 30 mg, succinylcholine 100 mg  Post Shock: None 23 seconds by EMG, 93 seconds by EEG  Seizure Duration: 23 seconds by EMG, 93 seconds by EEG   Comments: Follow-up one week   Lungs:  [x]   Clear to auscultation               []  Other:   Heart:    [x]   Regular rhythm             []  irregular rhythm    [x]   Previous H&P reviewed, patient examined and there are NO CHANGES                 []   Previous H&P reviewed, patient examined and there are changes noted.   [name], MD 5/12/201710:15 AM

## 2016-03-22 NOTE — Anesthesia Preprocedure Evaluation (Signed)
Anesthesia Evaluation  Patient identified by MRN, date of birth, ID band Patient awake    Reviewed: Allergy & Precautions, H&P , NPO status , Patient's Chart, lab work & pertinent test results, reviewed documented beta blocker date and time   History of Anesthesia Complications Negative for: history of anesthetic complications  Airway Mallampati: II  TM Distance: >3 FB     Dental  (+) Chipped   Pulmonary sleep apnea ,           Cardiovascular hypertension, Pt. on medications and Pt. on home beta blockers      Neuro/Psych PSYCHIATRIC DISORDERS Depression Bipolar Disorder  Neuromuscular disease    GI/Hepatic GERD  Controlled,  Endo/Other  diabetes, Type 2Morbid obesity  Renal/GU      Musculoskeletal   Abdominal   Peds  Hematology   Anesthesia Other Findings Past Medical History:   Depression                                                   Diabetes mellitus without complication ([place])                 GERD (gastroesophageal reflux disease)                       Hypercholesterolemia                            03/07/15      Hypertension                                                 Obesity                                         03/07/15      Personality disorder                            03/07/15      Sinus tachycardia ([place])                         03/07/15        Comment:history of   Suicidal thoughts                               03/07/15      Diabetic peripheral neuropathy ([place])            03/07/15      Diabetic peripheral neuropathy ([place])            03/07/15      Diabetic peripheral neuropathy ([place])            03/07/15     Past Surgical History:   electroconvulsion therapy                        03/07/15     BMI    Body Mass Index   40.22 kg/m 2  Reproductive/Obstetrics                             Anesthesia Physical  Anesthesia Plan  ASA: III  Anesthesia Plan: General    Post-op Pain Management:    Induction: Intravenous  Airway Management Planned: Mask  Additional Equipment:   Intra-op Plan:   Post-operative Plan:   Informed Consent: I have reviewed the patients History and Physical, chart, labs and discussed the procedure including the risks, benefits and alternatives for the proposed anesthesia with the patient or authorized representative who has indicated his/her understanding and acceptance.     Plan Discussed with: CRNA  Anesthesia Plan Comments:         Anesthesia Quick Evaluation

## 2016-03-22 NOTE — H&P (Signed)
Melinda [name] is an 45 y.o. female.   Chief Complaint: No new complaints. Mood is feeling okay. Anxiety under control. No suicidal behavior and no self injuring behavior HPI: Chronic recurrent severe depression under control with weekly ECT and medication. Diabetes well controlled.  Past Medical History  Diagnosis Date  . Depression   . Diabetes mellitus without complication ([place])   . GERD (gastroesophageal reflux disease)   . Hypercholesterolemia 03/07/15  . Hypertension   . Obesity 03/07/15  . Personality disorder 03/07/15  . Sinus tachycardia ([place]) 03/07/15    history of  . Suicidal thoughts 03/07/15  . Diabetic peripheral neuropathy ([place]) 03/07/15  . Diabetic peripheral neuropathy ([place]) 03/07/15  . Diabetic peripheral neuropathy ([place]) 03/07/15    Past Surgical History  Procedure Laterality Date  . Electroconvulsion therapy  03/07/15    Family History  Problem Relation Age of Onset  . Hypertension Father   . Diabetes Mother    Social History:  reports that she has never smoked. She has never used smokeless tobacco. She reports that she does not drink alcohol or use illicit drugs.  Allergies:  Allergies  Allergen Reactions  . Prednisone     Increases blood sugar     (Not in a hospital admission)  Results for orders placed or performed during the hospital encounter of 03/22/16 (from the past 48 hour(s))  Glucose, capillary     Status: None   Collection Time: 03/22/16  8:37 AM  Result Value Ref Range   Glucose-Capillary 88 65 - 99 mg/dL  Pregnancy, urine POC     Status: None   Collection Time: 03/22/16  8:46 AM  Result Value Ref Range   Preg Test, Ur NEGATIVE NEGATIVE    Comment:        THE SENSITIVITY OF THIS METHODOLOGY IS >24 mIU/mL    No results found.  Review of Systems  Constitutional: Negative.   HENT: Negative.   Eyes: Negative.   Respiratory: Negative.   Cardiovascular: Negative.   Gastrointestinal: Negative.   Musculoskeletal: Negative.   Skin:  Negative.   Neurological: Negative.   Psychiatric/Behavioral: Negative for depression, suicidal ideas, hallucinations, memory loss and substance abuse. The patient is not nervous/anxious and does not have insomnia.     Blood pressure 135/71, pulse 81, temperature 96.3 F (35.7 C), temperature source Oral, resp. rate 16, height 5\' 3"  (1.6 m), weight 101.606 kg (224 lb), last menstrual period 12/08/2015, SpO2 100 %. Physical Exam  Nursing note and vitals reviewed. Constitutional: She appears well-developed and well-nourished.  HENT:  Head: Normocephalic and atraumatic.  Eyes: Conjunctivae are normal. Pupils are equal, round, and reactive to light.  Neck: Normal range of motion.  Cardiovascular: Normal rate, regular rhythm and normal heart sounds.   Respiratory: Effort normal and breath sounds normal. No respiratory distress.  GI: Soft.  Musculoskeletal: Normal range of motion.  Neurological: She is alert.  Skin: Skin is warm and dry.  Psychiatric: She has a normal mood and affect. Her behavior is normal. Judgment and thought content normal.     Assessment/Plan ECT today follow-up weekly schedule.  [name], MD 03/22/2016, 10:14 AM

## 2016-03-22 NOTE — Anesthesia Postprocedure Evaluation (Signed)
Anesthesia Post Note  Patient: Vaniyah M Inboden  Procedure(s) Performed: * No procedures listed *  Patient location during evaluation: PACU Anesthesia Type: General Level of consciousness: awake and alert Pain management: pain level controlled Vital Signs Assessment: post-procedure vital signs reviewed and stable Respiratory status: spontaneous breathing, nonlabored ventilation, respiratory function stable and patient connected to nasal cannula oxygen Cardiovascular status: blood pressure returned to baseline and stable Postop Assessment: no signs of nausea or vomiting Anesthetic complications: no    Last Vitals:  Filed Vitals:   03/22/16 1114 03/22/16 1125  BP: 122/98 118/79  Pulse: 82 80  Temp:    Resp: 16 18    Last Pain:  Filed Vitals:   03/22/16 1127  PainSc: 0-No pain                 Martha Clan

## 2016-03-22 NOTE — Transfer of Care (Signed)
Immediate Anesthesia Transfer of Care Note  Patient: Melinda [name]  Procedure(s) Performed: ECT  Patient Location: PACU  Anesthesia Type:General  Level of Consciousness: sedated  Airway & Oxygen Therapy: Patient Spontanous Breathing and Patient connected to face mask oxygen  Post-op Assessment: Report given to RN  Post vital signs: Reviewed and stable  Last Vitals:  Filed Vitals:   03/22/16 0835 03/22/16 1034  BP: 135/71 138/94  Pulse: 81 88  Temp: 35.7 C 98.60F  Resp: 16 14    Last Pain: There were no vitals filed for this visit.       Complications: No apparent anesthesia complications

## 2016-03-22 NOTE — Discharge Instructions (Signed)
1)  The drugs that you have been given will stay in your system until tomorrow so for the       next 24 hours you should not:  A. Drive an automobile  B. Make any legal decisions  C. Drink any alcoholic beverages  2)  You may resume your regular meals upon return home.  3)  A responsible adult must take you home.  Someone should stay with you for a few hours, then be available by phone for the remainder of the treatment day.  4)  You May experience any of the following symptoms:  Headache, Nausea and a dry mouth (due to the medications you were given),               temporary memory loss and some confusion, or sore muscles (a warm bath should help this).   If you you experience any of these symptoms let us know on your return visit.  5)  Report any of the following: any acute discomfort, severe headache, or temperature        greater than 100.5 F.   Also report any unusual redness, swelling, drainage, or pain         at your IV site.    You may report Symptoms to:  [place] at [facility]          Phone: [phone], ECT Department           or Dr. Prescott Gum office [phone]  6)  Your next ECT Treatment is FRIDAY , February 28, 2016  We will call 2 days prior to your scheduled appointment for arrival times.  7)  Nothing to eat or drink after midnight the night before your procedure.  8)  Take LISINOPRIL     With a sip of water the morning of your procedure.  9)  Other Instructions: Call [phone] to cancel the morning of your procedure due to illness or emergency.  10) We will call within 72 hours to assess how you are feeling.

## 2016-03-22 NOTE — Anesthesia Procedure Notes (Signed)
Date/Time: 03/22/2016 10:21 AM Performed by: Dionne Bucy Pre-anesthesia Checklist: Patient identified, Emergency Drugs available, Suction available and Patient being monitored Patient Re-evaluated:Patient Re-evaluated prior to inductionOxygen Delivery Method: Circle system utilized Preoxygenation: Pre-oxygenation with 100% oxygen Intubation Type: IV induction Ventilation: Mask ventilation without difficulty and Mask ventilation throughout procedure Airway Equipment and Method: Bite block Placement Confirmation: positive ETCO2 Dental Injury: Teeth and Oropharynx as per pre-operative assessment

## 2016-03-28 ENCOUNTER — Other Ambulatory Visit: Payer: Self-pay | Admitting: Psychiatry

## 2016-03-29 ENCOUNTER — Encounter: Payer: Self-pay | Admitting: Anesthesiology

## 2016-03-29 ENCOUNTER — Encounter (HOSPITAL_BASED_OUTPATIENT_CLINIC_OR_DEPARTMENT_OTHER)
Admission: RE | Admit: 2016-03-29 | Discharge: 2016-03-29 | Disposition: A | Discharge status: Home / Self Care | Payer: Medicare PPO | Source: Ambulatory Visit | Attending: Psychiatry | Admitting: Psychiatry

## 2016-03-29 DIAGNOSIS — E669 Obesity, unspecified: Secondary | ICD-10-CM | POA: Diagnosis not present

## 2016-03-29 DIAGNOSIS — K219 Gastro-esophageal reflux disease without esophagitis: Secondary | ICD-10-CM | POA: Diagnosis not present

## 2016-03-29 DIAGNOSIS — I10 Essential (primary) hypertension: Secondary | ICD-10-CM | POA: Diagnosis not present

## 2016-03-29 DIAGNOSIS — F332 Major depressive disorder, recurrent severe without psychotic features: Secondary | ICD-10-CM | POA: Diagnosis not present

## 2016-03-29 DIAGNOSIS — E7800 Pure hypercholesterolemia, unspecified: Secondary | ICD-10-CM | POA: Diagnosis not present

## 2016-03-29 DIAGNOSIS — E1142 Type 2 diabetes mellitus with diabetic polyneuropathy: Secondary | ICD-10-CM | POA: Diagnosis not present

## 2016-03-29 DIAGNOSIS — E119 Type 2 diabetes mellitus without complications: Secondary | ICD-10-CM | POA: Diagnosis not present

## 2016-03-29 MED ORDER — SODIUM CHLORIDE 0.9 % IV SOLN
INTRAVENOUS | Status: DC | PRN
  Administered 2016-03-29: 10:00:00 via INTRAVENOUS

## 2016-03-29 MED ORDER — SUCCINYLCHOLINE 20MG/ML (10ML) SYRINGE FOR MEDFUSION PUMP - OPTIME
INTRAMUSCULAR | Status: DC | PRN
  Administered 2016-03-29: 100 mg via INTRAVENOUS

## 2016-03-29 MED ORDER — KETOROLAC TROMETHAMINE 30 MG/ML IJ SOLN
INTRAMUSCULAR | Status: AC
  Administered 2016-03-29: 30 mg via INTRAVENOUS
  Filled 2016-03-29: qty 1

## 2016-03-29 MED ORDER — KETOROLAC TROMETHAMINE 30 MG/ML IJ SOLN
30.0000 mg | Freq: Once | INTRAMUSCULAR | Status: AC
  Administered 2016-03-29: 30 mg via INTRAVENOUS

## 2016-03-29 MED ORDER — GLYCOPYRROLATE 0.2 MG/ML IJ SOLN
INTRAMUSCULAR | Status: AC
  Administered 2016-03-29: 0.4 mg via INTRAVENOUS
  Filled 2016-03-29: qty 2

## 2016-03-29 MED ORDER — SODIUM CHLORIDE 0.9 % IV SOLN
250.0000 mL | Freq: Once | INTRAVENOUS | Status: AC
  Administered 2016-03-29: 500 mL via INTRAVENOUS

## 2016-03-29 MED ORDER — GLYCOPYRROLATE 0.2 MG/ML IJ SOLN
0.4000 mg | Freq: Once | INTRAMUSCULAR | Status: AC
  Administered 2016-03-29: 0.4 mg via INTRAVENOUS

## 2016-03-29 MED ORDER — LABETALOL HCL 5 MG/ML IV SOLN
INTRAVENOUS | Status: DC | PRN
  Administered 2016-03-29: 20 mg via INTRAVENOUS

## 2016-03-29 MED ORDER — METHOHEXITAL SODIUM 100 MG/10ML IV SOSY
PREFILLED_SYRINGE | INTRAVENOUS | Status: DC | PRN
  Administered 2016-03-29: 70 mg via INTRAVENOUS

## 2016-03-29 NOTE — H&P (Signed)
Melinda [name] is an 45 y.o. female.   Chief Complaint: Patient has no new complaints today. Mood is been stable. No suicidal ideation. HPI: Chronic depression and stays stable with weekly ECT. Tolerated well. No problems with memory  Past Medical History  Diagnosis Date  . Depression   . Diabetes mellitus without complication ([place])   . GERD (gastroesophageal reflux disease)   . Hypercholesterolemia 03/07/15  . Hypertension   . Obesity 03/07/15  . Personality disorder 03/07/15  . Sinus tachycardia ([place]) 03/07/15    history of  . Suicidal thoughts 03/07/15  . Diabetic peripheral neuropathy ([place]) 03/07/15  . Diabetic peripheral neuropathy ([place]) 03/07/15  . Diabetic peripheral neuropathy ([place]) 03/07/15    Past Surgical History  Procedure Laterality Date  . Electroconvulsion therapy  03/07/15    Family History  Problem Relation Age of Onset  . Hypertension Father   . Diabetes Mother    Social History:  reports that she has never smoked. She has never used smokeless tobacco. She reports that she does not drink alcohol or use illicit drugs.  Allergies:  Allergies  Allergen Reactions  . Prednisone     Increases blood sugar     (Not in a hospital admission)  No results found for this or any previous visit (from the past 48 hour(s)). No results found.  Review of Systems  Constitutional: Negative.   HENT: Negative.   Eyes: Negative.   Respiratory: Negative.   Cardiovascular: Negative.   Gastrointestinal: Negative.   Musculoskeletal: Negative.   Skin: Negative.   Neurological: Negative.   Psychiatric/Behavioral: Negative for depression, suicidal ideas, hallucinations, memory loss and substance abuse. The patient is not nervous/anxious and does not have insomnia.     Blood pressure 145/91, pulse 74, temperature 97.7 F (36.5 C), temperature source Oral, weight 101.152 kg (223 lb), last menstrual period 12/08/2015, SpO2 100 %. Physical Exam  Nursing note and vitals  reviewed. Constitutional: She appears well-developed and well-nourished.  HENT:  Head: Normocephalic and atraumatic.  Eyes: Conjunctivae are normal. Pupils are equal, round, and reactive to light.  Neck: Normal range of motion.  Cardiovascular: Normal heart sounds.   Respiratory: Effort normal.  GI: Soft.  Musculoskeletal: Normal range of motion.  Neurological: She is alert.  Skin: Skin is warm and dry.  Psychiatric: She has a normal mood and affect. Her speech is normal and behavior is normal. Judgment and thought content normal. Thought content is not paranoid. Cognition and memory are normal.     Assessment/Plan Patient is doing well. Treatment today follow-up in 1 week.  [name], MD 03/29/2016, 10:22 AM

## 2016-03-29 NOTE — Procedures (Signed)
ECT SERVICES Physician's Interval Evaluation & Treatment Note  Patient Identification: SKILA ANGELETTI MRN:  [id number] Date of Evaluation:  03/29/2016 TX #: [id number]  MADRS:   MMSE:   P.E. Findings:  No change to physical exam. Vital signs normal. Heart normal lungs clear  Psychiatric Interval Note:  Mood is stable no depression no suicidal thoughts no psychosis.  Subjective:  Patient is a 45 y.o. female seen for evaluation for Electroconvulsive Therapy. No specific new complaints  Treatment Summary:   []   Right Unilateral             [x]  Bilateral   % Energy : 1.0 ms, 35%   Impedance: 1500 ohms  Seizure Energy Index: No reading obtained  Postictal Suppression Index: No reading obtained although it looks good  Seizure Concordance Index: 88%  Medications  Pre Shock: Robinul 0.4 mg, labetalol 20 mg, Toradol 30 mg, Brevital 70 mg, succinylcholine 100 mg  Post Shock: No medication  Seizure Duration: 36 seconds by EMG, 68 seconds by EEG   Comments: Computer did not read the end of the EEG seizure which I do not understand as it was very clear cut. Follow-up one week.   Lungs:  [x]   Clear to auscultation               []  Other:   Heart:    [x]   Regular rhythm             []  irregular rhythm    [x]   Previous H&P reviewed, patient examined and there are NO CHANGES                 []   Previous H&P reviewed, patient examined and there are changes noted.   [name], MD 5/19/201710:23 AM

## 2016-03-29 NOTE — Anesthesia Postprocedure Evaluation (Signed)
Anesthesia Post Note  Patient: Melinda [name]  Procedure(s) Performed: * No procedures listed *  Patient location during evaluation: PACU Anesthesia Type: General Level of consciousness: awake and alert Pain management: pain level controlled Vital Signs Assessment: post-procedure vital signs reviewed and stable Respiratory status: spontaneous breathing, nonlabored ventilation, respiratory function stable and patient connected to nasal cannula oxygen Cardiovascular status: blood pressure returned to baseline and stable Postop Assessment: no signs of nausea or vomiting Anesthetic complications: no    Last Vitals:  Filed Vitals:   03/29/16 1107 03/29/16 1111  BP:  112/72  Pulse: 73   Temp:    Resp: 18     Last Pain: There were no vitals filed for this visit.               Precious Haws Lorelle Macaluso

## 2016-03-29 NOTE — Discharge Instructions (Signed)
1)  The drugs that you have been given will stay in your system until tomorrow so for the       next 24 hours you should not:  A. Drive an automobile  B. Make any legal decisions  C. Drink any alcoholic beverages  2)  You may resume your regular meals upon return home.  3)  A responsible adult must take you home.  Someone should stay with you for a few          hours, then be available by phone for the remainder of the treatment day.  4)  You May experience any of the following symptoms:  Headache, Nausea and a dry mouth (due to the medications you were given),  temporary memory loss and some confusion, or sore muscles (a warm bath  should help this).  If you you experience any of these symptoms let us know on                your return visit.  5)  Report any of the following: any acute discomfort, severe headache, or temperature        greater than 100.5 F.   Also report any unusual redness, swelling, drainage, or pain         at your IV site.    You may report Symptoms to:  [place] at [facility]          Phone: [phone], ECT Department           or Dr. Prescott Gum office [phone]  6)  Your next ECT Treatment is Day Friday  Date May 26  We will call 2 days prior to your scheduled appointment for arrival times.  7)  Nothing to eat or drink after midnight the night before your procedure.  8)  Take Lisinopril    With a sip of water the morning of your procedure.  9)  Other Instructions: Call [phone] to cancel the morning of your procedure due         to illness or emergency.  10) We will call within 72 hours to assess how you are feeling.

## 2016-03-29 NOTE — Transfer of Care (Signed)
Immediate Anesthesia Transfer of Care Note  Patient: Melinda [name]  Procedure(s) Performed: ECT  Patient Location: PACU  Anesthesia Type:General  Level of Consciousness: awake and patient cooperative  Airway & Oxygen Therapy: Patient Spontanous Breathing and Patient connected to face mask oxygen  Post-op Assessment: Report given to RN  Post vital signs: Reviewed and stable  Last Vitals:  Filed Vitals:   03/29/16 0825 03/29/16 1032  BP: 145/91 115/75  Pulse: 74 88  Temp: 36.5 C 37.4 C  Resp:  11    Last Pain: There were no vitals filed for this visit.       Complications: No apparent anesthesia complications

## 2016-03-29 NOTE — Anesthesia Procedure Notes (Signed)
Date/Time: 03/29/2016 10:18 AM Performed by: Dionne Bucy Pre-anesthesia Checklist: Patient identified, Emergency Drugs available, Suction available and Patient being monitored Patient Re-evaluated:Patient Re-evaluated prior to inductionOxygen Delivery Method: Circle system utilized Preoxygenation: Pre-oxygenation with 100% oxygen Intubation Type: IV induction Ventilation: Mask ventilation without difficulty and Mask ventilation throughout procedure Airway Equipment and Method: Bite block Placement Confirmation: positive ETCO2 Dental Injury: Teeth and Oropharynx as per pre-operative assessment

## 2016-03-29 NOTE — Anesthesia Preprocedure Evaluation (Signed)
Anesthesia Evaluation  Patient identified by MRN, date of birth, ID band Patient awake    Reviewed: Allergy & Precautions, H&P , NPO status , Patient's Chart, lab work & pertinent test results, reviewed documented beta blocker date and time   History of Anesthesia Complications Negative for: history of anesthetic complications  Airway Mallampati: II  TM Distance: >3 FB     Dental  (+) Chipped   Pulmonary sleep apnea ,           Cardiovascular hypertension, Pt. on medications and Pt. on home beta blockers      Neuro/Psych PSYCHIATRIC DISORDERS Depression Bipolar Disorder  Neuromuscular disease    GI/Hepatic GERD  Controlled,  Endo/Other  diabetes, Type 2Morbid obesity  Renal/GU      Musculoskeletal   Abdominal   Peds  Hematology   Anesthesia Other Findings Past Medical History:   Depression                                                   Diabetes mellitus without complication ([place])                 GERD (gastroesophageal reflux disease)                       Hypercholesterolemia                            03/07/15      Hypertension                                                 Obesity                                         03/07/15      Personality disorder                            03/07/15      Sinus tachycardia ([place])                         03/07/15        Comment:history of   Suicidal thoughts                               03/07/15      Diabetic peripheral neuropathy ([place])            03/07/15      Diabetic peripheral neuropathy ([place])            03/07/15      Diabetic peripheral neuropathy ([place])            03/07/15     Past Surgical History:   electroconvulsion therapy                        03/07/15     BMI    Body Mass Index   40.22 kg/m 2  Reproductive/Obstetrics                             Anesthesia Physical  Anesthesia Plan  ASA: III  Anesthesia Plan: General    Post-op Pain Management:    Induction: Intravenous  Airway Management Planned: Mask  Additional Equipment:   Intra-op Plan:   Post-operative Plan:   Informed Consent: I have reviewed the patients History and Physical, chart, labs and discussed the procedure including the risks, benefits and alternatives for the proposed anesthesia with the patient or authorized representative who has indicated his/her understanding and acceptance.     Plan Discussed with: CRNA  Anesthesia Plan Comments:         Anesthesia Quick Evaluation

## 2016-04-01 LAB — POCT PREGNANCY, URINE: Preg Test, Ur: NEGATIVE

## 2016-04-01 LAB — GLUCOSE, CAPILLARY: Glucose-Capillary: 84 mg/dL (ref 65–99)

## 2016-04-05 ENCOUNTER — Encounter: Payer: Self-pay | Admitting: Certified Registered Nurse Anesthetist

## 2016-04-05 ENCOUNTER — Other Ambulatory Visit: Payer: Self-pay | Admitting: Psychiatry

## 2016-04-05 ENCOUNTER — Encounter (HOSPITAL_BASED_OUTPATIENT_CLINIC_OR_DEPARTMENT_OTHER)
Admission: RE | Admit: 2016-04-05 | Discharge: 2016-04-05 | Disposition: A | Discharge status: Home / Self Care | Payer: Medicare PPO | Source: Ambulatory Visit | Attending: Psychiatry | Admitting: Psychiatry

## 2016-04-05 DIAGNOSIS — F3181 Bipolar II disorder: Secondary | ICD-10-CM | POA: Diagnosis not present

## 2016-04-05 DIAGNOSIS — I10 Essential (primary) hypertension: Secondary | ICD-10-CM | POA: Diagnosis not present

## 2016-04-05 DIAGNOSIS — E669 Obesity, unspecified: Secondary | ICD-10-CM | POA: Diagnosis not present

## 2016-04-05 DIAGNOSIS — G4730 Sleep apnea, unspecified: Secondary | ICD-10-CM | POA: Diagnosis not present

## 2016-04-05 DIAGNOSIS — E1142 Type 2 diabetes mellitus with diabetic polyneuropathy: Secondary | ICD-10-CM | POA: Diagnosis not present

## 2016-04-05 DIAGNOSIS — F332 Major depressive disorder, recurrent severe without psychotic features: Secondary | ICD-10-CM

## 2016-04-05 DIAGNOSIS — E7800 Pure hypercholesterolemia, unspecified: Secondary | ICD-10-CM | POA: Diagnosis not present

## 2016-04-05 DIAGNOSIS — K219 Gastro-esophageal reflux disease without esophagitis: Secondary | ICD-10-CM | POA: Diagnosis not present

## 2016-04-05 DIAGNOSIS — E119 Type 2 diabetes mellitus without complications: Secondary | ICD-10-CM | POA: Diagnosis not present

## 2016-04-05 LAB — GLUCOSE, CAPILLARY
Glucose-Capillary: 84 mg/dL (ref 65–99)
Glucose-Capillary: 92 mg/dL (ref 65–99)

## 2016-04-05 LAB — POCT PREGNANCY, URINE: Preg Test, Ur: NEGATIVE

## 2016-04-05 MED ORDER — ESMOLOL HCL-SODIUM CHLORIDE 2000 MG/100ML IV SOLN: 10.0000 mg | Freq: Once | INTRAVENOUS | Status: DC

## 2016-04-05 MED ORDER — SODIUM CHLORIDE 0.9 % IV SOLN
250.0000 mL | Freq: Once | INTRAVENOUS | Status: AC
  Administered 2016-04-05: 500 mL via INTRAVENOUS

## 2016-04-05 MED ORDER — GLYCOPYRROLATE 0.2 MG/ML IJ SOLN
INTRAMUSCULAR | Status: AC
  Administered 2016-04-05: 0.4 mg via INTRAVENOUS
  Filled 2016-04-05: qty 2

## 2016-04-05 MED ORDER — GLYCOPYRROLATE 0.2 MG/ML IJ SOLN
0.4000 mg | Freq: Once | INTRAMUSCULAR | Status: AC
  Administered 2016-04-05: 0.4 mg via INTRAVENOUS

## 2016-04-05 MED ORDER — ESMOLOL HCL 100 MG/10ML IV SOLN
INTRAVENOUS | Status: DC | PRN
  Administered 2016-04-05: 10 mg via INTRAVENOUS

## 2016-04-05 MED ORDER — LABETALOL HCL 5 MG/ML IV SOLN: 20.0000 mg | Freq: Once | INTRAVENOUS | Status: DC

## 2016-04-05 MED ORDER — METHOHEXITAL SODIUM 100 MG/10ML IV SOSY
PREFILLED_SYRINGE | INTRAVENOUS | Status: DC | PRN
  Administered 2016-04-05: 70 mg via INTRAVENOUS

## 2016-04-05 MED ORDER — KETOROLAC TROMETHAMINE 30 MG/ML IJ SOLN
30.0000 mg | Freq: Once | INTRAMUSCULAR | Status: AC
  Administered 2016-04-05: 30 mg via INTRAVENOUS

## 2016-04-05 MED ORDER — KETOROLAC TROMETHAMINE 30 MG/ML IJ SOLN
INTRAMUSCULAR | Status: AC
  Administered 2016-04-05: 30 mg via INTRAVENOUS
  Filled 2016-04-05: qty 1

## 2016-04-05 MED ORDER — SUCCINYLCHOLINE CHLORIDE 20 MG/ML IJ SOLN
INTRAMUSCULAR | Status: DC | PRN
  Administered 2016-04-05: 100 mg via INTRAVENOUS

## 2016-04-05 MED ORDER — LABETALOL HCL 5 MG/ML IV SOLN
INTRAVENOUS | Status: DC | PRN
  Administered 2016-04-05: 20 mg via INTRAVENOUS

## 2016-04-05 NOTE — H&P (Signed)
Melinda [name] is an 45 y.o. female.   Chief Complaint: No complaints today. Mood is good. No suicidal thoughts no psychosis no self injury HPI: Patient with recurrent severe depression tolerating treatment well with weekly treatment  Past Medical History  Diagnosis Date  . Depression   . Diabetes mellitus without complication ([place])   . GERD (gastroesophageal reflux disease)   . Hypercholesterolemia 03/07/15  . Hypertension   . Obesity 03/07/15  . Personality disorder 03/07/15  . Sinus tachycardia ([place]) 03/07/15    history of  . Suicidal thoughts 03/07/15  . Diabetic peripheral neuropathy ([place]) 03/07/15  . Diabetic peripheral neuropathy ([place]) 03/07/15  . Diabetic peripheral neuropathy ([place]) 03/07/15    Past Surgical History  Procedure Laterality Date  . Electroconvulsion therapy  03/07/15    Family History  Problem Relation Age of Onset  . Hypertension Father   . Diabetes Mother    Social History:  reports that she has never smoked. She has never used smokeless tobacco. She reports that she does not drink alcohol or use illicit drugs.  Allergies:  Allergies  Allergen Reactions  . Prednisone     Increases blood sugar     (Not in a hospital admission)  Results for orders placed or performed during the hospital encounter of 04/05/16 (from the past 48 hour(s))  Pregnancy, urine POC     Status: None   Collection Time: 04/05/16  8:15 AM  Result Value Ref Range   Preg Test, Ur NEGATIVE NEGATIVE    Comment:        THE SENSITIVITY OF THIS METHODOLOGY IS >24 mIU/mL   Glucose, capillary     Status: None   Collection Time: 04/05/16  8:36 AM  Result Value Ref Range   Glucose-Capillary 84 65 - 99 mg/dL   No results found.  Review of Systems  Constitutional: Negative.   HENT: Negative.   Eyes: Negative.   Respiratory: Negative.   Cardiovascular: Negative.   Gastrointestinal: Negative.   Musculoskeletal: Negative.   Skin: Negative.   Neurological: Negative.    Psychiatric/Behavioral: Negative for depression, suicidal ideas, hallucinations, memory loss and substance abuse. The patient is not nervous/anxious and does not have insomnia.     Blood pressure 128/86, pulse 83, temperature 97.9 F (36.6 C), temperature source Oral, resp. rate 18, weight 100.245 kg (221 lb), last menstrual period 12/08/2015, SpO2 100 %. Physical Exam  Nursing note and vitals reviewed. Constitutional: She appears well-developed and well-nourished.  HENT:  Head: Normocephalic and atraumatic.  Eyes: Conjunctivae are normal. Pupils are equal, round, and reactive to light.  Neck: Normal range of motion.  Cardiovascular: Normal rate, regular rhythm and normal heart sounds.   Respiratory: Effort normal and breath sounds normal. No respiratory distress.  GI: Soft.  Musculoskeletal: Normal range of motion.  Neurological: She is alert.  Skin: Skin is warm and dry.  Psychiatric: She has a normal mood and affect. Her behavior is normal. Judgment and thought content normal.     Assessment/Plan Follow-up in 1 week after treatment today no change to procedure reviewed plan and medicine with patient.  [name], MD 04/05/2016, 10:33 AM

## 2016-04-05 NOTE — Anesthesia Preprocedure Evaluation (Signed)
Anesthesia Evaluation  Patient identified by MRN, date of birth, ID band Patient awake    Reviewed: Allergy & Precautions, H&P , NPO status , Patient's Chart, lab work & pertinent test results, reviewed documented beta blocker date and time   History of Anesthesia Complications Negative for: history of anesthetic complications  Airway Mallampati: II  TM Distance: >3 FB     Dental  (+) Chipped   Pulmonary sleep apnea ,           Cardiovascular hypertension, Pt. on medications and Pt. on home beta blockers      Neuro/Psych PSYCHIATRIC DISORDERS Depression Bipolar Disorder  Neuromuscular disease    GI/Hepatic GERD  Controlled,  Endo/Other  diabetes, Type 2Morbid obesity  Renal/GU      Musculoskeletal   Abdominal   Peds  Hematology   Anesthesia Other Findings Past Medical History:   Depression                                                   Diabetes mellitus without complication ([place])                 GERD (gastroesophageal reflux disease)                       Hypercholesterolemia                            03/07/15      Hypertension                                                 Obesity                                         03/07/15      Personality disorder                            03/07/15      Sinus tachycardia ([place])                         03/07/15        Comment:history of   Suicidal thoughts                               03/07/15      Diabetic peripheral neuropathy ([place])            03/07/15      Diabetic peripheral neuropathy ([place])            03/07/15      Diabetic peripheral neuropathy ([place])            03/07/15     Past Surgical History:   electroconvulsion therapy                        03/07/15     BMI    Body Mass Index   40.22 kg/m 2  Reproductive/Obstetrics                             Anesthesia Physical  Anesthesia Plan  ASA: III  Anesthesia Plan: General    Post-op Pain Management:    Induction: Intravenous  Airway Management Planned: Mask  Additional Equipment:   Intra-op Plan:   Post-operative Plan:   Informed Consent: I have reviewed the patients History and Physical, chart, labs and discussed the procedure including the risks, benefits and alternatives for the proposed anesthesia with the patient or authorized representative who has indicated his/her understanding and acceptance.     Plan Discussed with: CRNA  Anesthesia Plan Comments:         Anesthesia Quick Evaluation

## 2016-04-05 NOTE — Transfer of Care (Signed)
Immediate Anesthesia Transfer of Care Note  Patient: Melinda [name]  Procedure(s) Performed: * No procedures listed *  Patient Location: PACU  Anesthesia Type:General  Level of Consciousness: awake and alert   Airway & Oxygen Therapy: Patient Spontanous Breathing and Patient connected to face mask oxygen  Post-op Assessment: Report given to RN and Post -op Vital signs reviewed and stable  Post vital signs: Reviewed and stable  Last Vitals:  Filed Vitals:   04/05/16 0816 04/05/16 1040  BP: 128/86 105/91  Pulse: 83 96  Temp: 36.6 C 37.2 C  Resp: 18 12    Last Pain:  Filed Vitals:   04/05/16 1041  PainSc: Asleep         Complications: No apparent anesthesia complications

## 2016-04-05 NOTE — Discharge Instructions (Addendum)
1)  The drugs that you have been given will stay in your system until tomorrow so for the       next 24 hours you should not:  A. Drive an automobile  B. Make any legal decisions  C. Drink any alcoholic beverages  2)  You may resume your regular meals upon return home.  3)  A responsible adult must take you home.  Someone should stay with you for a few          hours, then be available by phone for the remainder of the treatment day.  4)  You May experience any of the following symptoms:  Headache, Nausea and a dry mouth (due to the medications you were given),  temporary memory loss and some confusion, or sore muscles (a warm bath  should help this).  If you you experience any of these symptoms let us know on                your return visit.  5)  Report any of the following: any acute discomfort, severe headache, or temperature        greater than 100.5 F.   Also report any unusual redness, swelling, drainage, or pain         at your IV site.    You may report Symptoms to:  [place] at [facility]          Phone: [phone], ECT Department           or Dr. Prescott Gum office [phone]  6)  Your next ECT Treatment is Day Friday Date June 2  We will call 2 days prior to your scheduled appointment for arrival times.  7)  Nothing to eat or drink after midnight the night before your procedure.  8)  Take Lisinopril     With a sip of water the morning of your procedure.  9)  Other Instructions: Call [phone] to cancel the morning of your procedure due         to illness or emergency.  10) We will call within 72 hours to assess how you are feeling.

## 2016-04-05 NOTE — Anesthesia Procedure Notes (Signed)
Performed by: Sharnetta Gielow Pre-anesthesia Checklist: Patient identified, Patient being monitored, Timeout performed, Emergency Drugs available and Suction available Patient Re-evaluated:Patient Re-evaluated prior to inductionOxygen Delivery Method: Circle system utilized Preoxygenation: Pre-oxygenation with 100% oxygen Ventilation: Mask ventilation without difficulty Airway Equipment and Method: Bite block Dental Injury: Teeth and Oropharynx as per pre-operative assessment        

## 2016-04-05 NOTE — Procedures (Signed)
ECT SERVICES Physician's Interval Evaluation & Treatment Note  Patient Identification: Melinda [name] MRN:  [id number] Date of Evaluation:  04/05/2016 TX #: [id number]  MADRS:   MMSE:   P.E. Findings:  No change to physical exam. Vitals normal. Lungs clear heart normal  Psychiatric Interval Note:  Mood is stated as okay no suicidal thoughts no psychosis no severe depression  Subjective:  Patient is a 45 y.o. female seen for evaluation for Electroconvulsive Therapy. No specific new complaint  Treatment Summary:   []   Right Unilateral             [x]  Bilateral   % Energy : 1.0 ms, 35%   Impedance: 730 ohms  Seizure Energy Index: 3424 V squared  Postictal Suppression Index: 79%  Seizure Concordance Index: 93%  Medications  Pre Shock: Robinul 0.4 mg, esmolol 10 mg, labetalol 20 g, Toradol 30 mg, Brevital 70 mg, succinylcholine 100 mg  Post Shock: None  Seizure Duration: 36 seconds by EMG, 58 seconds by EEG   Comments: Follow-up in 1 week   Lungs:  [x]   Clear to auscultation               []  Other:   Heart:    [x]   Regular rhythm             []  irregular rhythm    [x]   Previous H&P reviewed, patient examined and there are NO CHANGES                 []   Previous H&P reviewed, patient examined and there are changes noted.   [name], MD 5/26/201710:35 AM

## 2016-04-05 NOTE — Procedures (Deleted)
ECT SERVICES Physician's Interval Evaluation & Treatment Note  Patient Identification: Melinda [name] MRN:  [id number] Date of Evaluation:  04/05/2016 TX #: [id number]  MADRS:   MMSE:   P.E. Findings:  No change to physical exam. Lungs clear. Heart normal. Vital signs stable. Blood sugar stable.  Psychiatric Interval Note:  Mood is depressed  Subjective:  Patient is a 45 y.o. female seen for evaluation for Electroconvulsive Therapy. Just still feeling down  Treatment Summary:   [x]   Right Unilateral             []  Bilateral   % Energy : 0.3 ms, 100%   Impedance: 950 ohms  Seizure Energy Index: 3788 V squared  Postictal Suppression Index: 85%  Seizure Concordance Index: 96%  Medications  Pre Shock: Zofran 4 mg, Toradol 30 mg, Brevital 120 mg, succinylcholine 150 mg  Post Shock: Versed 2 mg  Seizure Duration: 19 seconds by EMG, 49 seconds by EEG   Comments: We will have him come back on Wednesday because we are not having treatment on Monday   Lungs:  [x]   Clear to auscultation               []  Other:   Heart:    [x]   Regular rhythm             []  irregular rhythm    [x]   Previous H&P reviewed, patient examined and there are NO CHANGES                 []   Previous H&P reviewed, patient examined and there are changes noted.   [name], MD 5/26/201710:44 AM

## 2016-04-05 NOTE — Anesthesia Postprocedure Evaluation (Signed)
Anesthesia Post Note  Patient: Melinda [name]  Procedure(s) Performed: * No procedures listed *  Patient location during evaluation: PACU Anesthesia Type: General Level of consciousness: awake and alert Pain management: pain level controlled Vital Signs Assessment: post-procedure vital signs reviewed and stable Respiratory status: spontaneous breathing, nonlabored ventilation, respiratory function stable and patient connected to nasal cannula oxygen Cardiovascular status: blood pressure returned to baseline and stable Postop Assessment: no signs of nausea or vomiting Anesthetic complications: no    Last Vitals:  Filed Vitals:   04/05/16 1110 04/05/16 1119  BP: 125/68 116/76  Pulse: 99 81  Temp: 36.6 C 36.6 C  Resp: 18 16    Last Pain:  Filed Vitals:   04/05/16 1121  PainSc: [place]

## 2016-04-05 NOTE — H&P (Deleted)
Melinda [name] is an 45 y.o. female.   Chief Complaint: Patient still feeling depressed and down. Mood negative. No evidence psychosis no mania and no active suicidal plan or intent. HPI: Recurrent depression as the main feature of what appears to probably be bipolar or bipolar 2 disorder. Responds well to ECT but recently has declined  Past Medical History  Diagnosis Date  . Depression   . Diabetes mellitus without complication ([place])   . GERD (gastroesophageal reflux disease)   . Hypercholesterolemia 03/07/15  . Hypertension   . Obesity 03/07/15  . Personality disorder 03/07/15  . Sinus tachycardia ([place]) 03/07/15    history of  . Suicidal thoughts 03/07/15  . Diabetic peripheral neuropathy ([place]) 03/07/15  . Diabetic peripheral neuropathy ([place]) 03/07/15  . Diabetic peripheral neuropathy ([place]) 03/07/15    Past Surgical History  Procedure Laterality Date  . Electroconvulsion therapy  03/07/15    Family History  Problem Relation Age of Onset  . Hypertension Father   . Diabetes Mother    Social History:  reports that she has never smoked. She has never used smokeless tobacco. She reports that she does not drink alcohol or use illicit drugs.  Allergies:  Allergies  Allergen Reactions  . Prednisone     Increases blood sugar     (Not in a hospital admission)  Results for orders placed or performed during the hospital encounter of 04/05/16 (from the past 48 hour(s))  Pregnancy, urine POC     Status: None   Collection Time: 04/05/16  8:15 AM  Result Value Ref Range   Preg Test, Ur NEGATIVE NEGATIVE    Comment:        THE SENSITIVITY OF THIS METHODOLOGY IS >24 mIU/mL   Glucose, capillary     Status: None   Collection Time: 04/05/16  8:36 AM  Result Value Ref Range   Glucose-Capillary 84 65 - 99 mg/dL   No results found.  Review of Systems  Constitutional: Negative.   HENT: Negative.   Eyes: Negative.   Respiratory: Negative.   Cardiovascular: Negative.    Gastrointestinal: Negative.   Musculoskeletal: Negative.   Skin: Negative.   Neurological: Negative.   Psychiatric/Behavioral: Positive for depression. Negative for suicidal ideas, hallucinations, memory loss and substance abuse. The patient is nervous/anxious and has insomnia.     Blood pressure 105/91, pulse 96, temperature 99 F (37.2 C), temperature source Tympanic, resp. rate 12, weight 100.245 kg (221 lb), last menstrual period 12/08/2015, SpO2 100 %. Physical Exam  Nursing note and vitals reviewed. Constitutional: She appears well-developed and well-nourished.  HENT:  Head: Normocephalic and atraumatic.  Eyes: Conjunctivae are normal. Pupils are equal, round, and reactive to light.  Neck: Normal range of motion.  Cardiovascular: Normal rate, regular rhythm and normal heart sounds.   Respiratory: Effort normal and breath sounds normal. No respiratory distress.  GI: Soft.  Musculoskeletal: Normal range of motion.  Neurological: She is alert.  Skin: Skin is warm and dry.  Psychiatric: Judgment and thought content normal. Her speech is delayed. She is slowed. Cognition and memory are normal. She exhibits a depressed mood.     Assessment/Plan Treatment today and I'm proposing we see him again on Wednesday. No treatments Monday due to the holiday.  [name], MD 04/05/2016, 10:42 AM

## 2016-04-11 ENCOUNTER — Other Ambulatory Visit: Payer: Self-pay | Admitting: Psychiatry

## 2016-04-12 ENCOUNTER — Encounter: Payer: Self-pay | Admitting: Anesthesiology

## 2016-04-12 ENCOUNTER — Encounter
Admission: RE | Admit: 2016-04-12 | Discharge: 2016-04-12 | Disposition: A | Discharge status: Home / Self Care | Payer: Medicare PPO | Source: Ambulatory Visit | Attending: Psychiatry | Admitting: Psychiatry

## 2016-04-12 DIAGNOSIS — E669 Obesity, unspecified: Secondary | ICD-10-CM | POA: Diagnosis not present

## 2016-04-12 DIAGNOSIS — K219 Gastro-esophageal reflux disease without esophagitis: Secondary | ICD-10-CM | POA: Insufficient documentation

## 2016-04-12 DIAGNOSIS — F332 Major depressive disorder, recurrent severe without psychotic features: Secondary | ICD-10-CM | POA: Diagnosis not present

## 2016-04-12 DIAGNOSIS — I10 Essential (primary) hypertension: Secondary | ICD-10-CM | POA: Insufficient documentation

## 2016-04-12 DIAGNOSIS — E7800 Pure hypercholesterolemia, unspecified: Secondary | ICD-10-CM | POA: Insufficient documentation

## 2016-04-12 DIAGNOSIS — E119 Type 2 diabetes mellitus without complications: Secondary | ICD-10-CM | POA: Insufficient documentation

## 2016-04-12 MED ORDER — LABETALOL HCL 5 MG/ML IV SOLN
INTRAVENOUS | Status: DC | PRN
  Administered 2016-04-12: 20 mg via INTRAVENOUS

## 2016-04-12 MED ORDER — KETOROLAC TROMETHAMINE 30 MG/ML IJ SOLN
30.0000 mg | Freq: Once | INTRAMUSCULAR | Status: AC
  Administered 2016-04-12: 30 mg via INTRAVENOUS

## 2016-04-12 MED ORDER — ESMOLOL HCL 100 MG/10ML IV SOLN
INTRAVENOUS | Status: DC | PRN
  Administered 2016-04-12: 10 mg via INTRAVENOUS

## 2016-04-12 MED ORDER — SODIUM CHLORIDE 0.9 % IV SOLN
INTRAVENOUS | Status: DC | PRN
Start: 2016-04-12 — End: 2016-04-12
  Administered 2016-04-12: 10:00:00 via INTRAVENOUS

## 2016-04-12 MED ORDER — GLYCOPYRROLATE 0.2 MG/ML IJ SOLN
0.4000 mg | Freq: Once | INTRAMUSCULAR | Status: AC
  Administered 2016-04-12: 0.4 mg via INTRAVENOUS

## 2016-04-12 MED ORDER — METHOHEXITAL SODIUM 100 MG/10ML IV SOSY
PREFILLED_SYRINGE | INTRAVENOUS | Status: DC | PRN
  Administered 2016-04-12: 70 mg via INTRAVENOUS

## 2016-04-12 MED ORDER — ONDANSETRON HCL 4 MG/2ML IJ SOLN: 4.0000 mg | Freq: Once | INTRAMUSCULAR | Status: DC | PRN

## 2016-04-12 MED ORDER — FENTANYL CITRATE (PF) 100 MCG/2ML IJ SOLN: 25.0000 ug | INTRAMUSCULAR | Status: DC | PRN

## 2016-04-12 MED ORDER — SODIUM CHLORIDE 0.9 % IV SOLN
250.0000 mL | Freq: Once | INTRAVENOUS | Status: AC
  Administered 2016-04-12: 500 mL via INTRAVENOUS

## 2016-04-12 MED ORDER — SUCCINYLCHOLINE CHLORIDE 200 MG/10ML IV SOSY
PREFILLED_SYRINGE | INTRAVENOUS | Status: DC | PRN
  Administered 2016-04-12: 100 mg via INTRAVENOUS

## 2016-04-12 NOTE — Procedures (Signed)
ECT SERVICES Physician's Interval Evaluation & Treatment Note  Patient Identification: Melinda [name] MRN:  [id number] Date of Evaluation:  04/12/2016 TX #: [id number]  MADRS:   MMSE:   P.E. Findings:  No new physical complaints. Weight stable. Vital signs stable.  Psychiatric Interval Note:  Mood stable no return of severe depression no suicidal ideation no psychosis  Subjective:  Patient is a 45 y.o. female seen for evaluation for Electroconvulsive Therapy. No specific new complaint  Treatment Summary:   []   Right Unilateral             [x]  Bilateral   % Energy : 1.0 ms 35%   Impedance: 1190 ohms  Seizure Energy Index: 2000 V squared  Postictal Suppression Index: 91%  Seizure Concordance Index: 91%  Medications  Pre Shock: Robinul 0.4 mg, labetalol 20 mg, esmolol 10 mg, Toradol 30 mg, Brevital 70 mg, succinylcholine 100 mg  Post Shock: None  Seizure Duration: 42 seconds by EMG, 60 seconds by EEG   Comments: Follow-up one week   Lungs:  [x]   Clear to auscultation               []  Other:   Heart:    [x]   Regular rhythm             []  irregular rhythm    [x]   Previous H&P reviewed, patient examined and there are NO CHANGES                 []   Previous H&P reviewed, patient examined and there are changes noted.   [name], MD 6/2/201710:25 AM

## 2016-04-12 NOTE — Transfer of Care (Signed)
Immediate Anesthesia Transfer of Care Note  Patient: Melinda [name]  Procedure(s) Performed: * No procedures listed *  Patient Location: PACU  Anesthesia Type:General  Level of Consciousness: patient cooperative and lethargic  Airway & Oxygen Therapy: Patient Spontanous Breathing and Patient connected to face mask oxygen  Post-op Assessment: Report given to RN and Post -op Vital signs reviewed and stable  Post vital signs: Reviewed and stable  Last Vitals:  Filed Vitals:   04/12/16 0816 04/12/16 1042  BP: 124/76 138/89  Pulse: 74 89  Temp: 35.9 C 37.1 C  Resp: 16 11    Last Pain: There were no vitals filed for this visit.       Complications: No apparent anesthesia complications

## 2016-04-12 NOTE — Anesthesia Postprocedure Evaluation (Signed)
Anesthesia Post Note  Patient: Witney M Rack  Procedure(s) Performed: * No procedures listed *  Patient location during evaluation: PACU Anesthesia Type: General Level of consciousness: awake and alert Pain management: pain level controlled Vital Signs Assessment: post-procedure vital signs reviewed and stable Respiratory status: spontaneous breathing, nonlabored ventilation, respiratory function stable and patient connected to nasal cannula oxygen Cardiovascular status: blood pressure returned to baseline and stable Postop Assessment: no signs of nausea or vomiting Anesthetic complications: no    Last Vitals:  Filed Vitals:   04/12/16 1102 04/12/16 1110  BP: 128/115 114/68  Pulse: 82 78  Temp: 36.7 C 37.1 C  Resp: 18 18    Last Pain:  Filed Vitals:   04/12/16 1112  PainSc: 0-No pain                 Martha Clan

## 2016-04-12 NOTE — Discharge Instructions (Addendum)
1)  The drugs that you have been given will stay in your system until tomorrow so for the       next 24 hours you should not:  A. Drive an automobile  B. Make any legal decisions  C. Drink any alcoholic beverages  2)  You may resume your regular meals upon return home.  3)  A responsible adult must take you home.  Someone should stay with you for a few          hours, then be available by phone for the remainder of the treatment day.  4)  You May experience any of the following symptoms:  Headache, Nausea and a dry mouth (due to the medications you were given),  temporary memory loss and some confusion, or sore muscles (a warm bath  should help this).  If you you experience any of these symptoms let us know on                your return visit.  5)  Report any of the following: any acute discomfort, severe headache, or temperature        greater than 100.5 F.   Also report any unusual redness, swelling, drainage, or pain         at your IV site.    You may report Symptoms to:  [place] [facility]          Phone: [phone], ECT Department           or Dr. Prescott Gum office [phone]  6)  Your next ECT Treatment is Day Friday Date June 9  We will call 2 days prior to your scheduled appointment for arrival times.  7)  Nothing to eat or drink after midnight the night before your procedure.  8)  Take Lisinopril    With a sip of water the morning of your procedure.  9)  Other Instructions: Call [phone] to cancel the morning of your procedure due         to illness or emergency.  10) We will call within 72 hours to assess how you are feeling.

## 2016-04-12 NOTE — H&P (Signed)
Melinda [name] is a HPI: Chronic depression good response to ECT for maintenance treatment today. n 45 y.o. female.   Chief Complaint: Patient with chronic depression. No specific new complaint. Mood is stable.  Past Medical History  Diagnosis Date  . Depression   . Diabetes mellitus without complication ([place])   . GERD (gastroesophageal reflux disease)   . Hypercholesterolemia 03/07/15  . Hypertension   . Obesity 03/07/15  . Personality disorder 03/07/15  . Sinus tachycardia ([place]) 03/07/15    history of  . Suicidal thoughts 03/07/15  . Diabetic peripheral neuropathy ([place]) 03/07/15  . Diabetic peripheral neuropathy ([place]) 03/07/15  . Diabetic peripheral neuropathy ([place]) 03/07/15    Past Surgical History  Procedure Laterality Date  . Electroconvulsion therapy  03/07/15    Family History  Problem Relation Age of Onset  . Hypertension Father   . Diabetes Mother    Social History:  reports that she has never smoked. She has never used smokeless tobacco. She reports that she does not drink alcohol or use illicit drugs.  Allergies:  Allergies  Allergen Reactions  . Prednisone     Increases blood sugar     (Not in a hospital admission)  No results found for this or any previous visit (from the past 48 hour(s)). No results found.  Review of Systems  Constitutional: Negative.   HENT: Negative.   Eyes: Negative.   Respiratory: Negative.   Cardiovascular: Negative.   Gastrointestinal: Negative.   Musculoskeletal: Negative.   Skin: Negative.   Neurological: Negative.   Psychiatric/Behavioral: Negative for depression, suicidal ideas, hallucinations, memory loss and substance abuse. The patient is not nervous/anxious and does not have insomnia.     Blood pressure 124/76, pulse 74, temperature 96.7 F (35.9 C), temperature source Oral, resp. rate 16, weight 99.791 kg (220 lb), last menstrual period 12/08/2015, SpO2 99 %. Physical Exam  Nursing note and vitals  reviewed. Constitutional: She appears well-developed and well-nourished.  HENT:  Head: Normocephalic and atraumatic.  Eyes: Conjunctivae are normal. Pupils are equal, round, and reactive to light.  Neck: Normal range of motion.  Cardiovascular: Normal rate and normal heart sounds.   Respiratory: Effort normal.  GI: Soft.  Musculoskeletal: Normal range of motion.  Neurological: She is alert.  Skin: Skin is warm and dry.  Psychiatric: She has a normal mood and affect. Her speech is normal and behavior is normal. Judgment and thought content normal. Cognition and memory are normal.     Assessment/Plan Treatment today and continue on one week schedule. No change to medication profile right now. Supportive therapy.  [name], MD 04/12/2016, 10:21 AM

## 2016-04-12 NOTE — Anesthesia Preprocedure Evaluation (Signed)
Anesthesia Evaluation  Patient identified by MRN, date of birth, ID band Patient awake    History of Anesthesia Complications (+) POST - OP SPINAL HEADACHE  Airway Mallampati: II       Dental  (+) Teeth Intact   Pulmonary sleep apnea ,    breath sounds clear to auscultation       Cardiovascular Exercise Tolerance: Good hypertension, Pt. on medications  Rhythm:Regular Rate:Normal     Neuro/Psych Depression Bipolar Disorder    GI/Hepatic Neg liver ROS, GERD  Medicated,  Endo/Other  diabetes, Type 2, Oral Hypoglycemic AgentsMorbid obesity  Renal/GU negative Renal ROS     Musculoskeletal   Abdominal (+) + obese,   Peds  Hematology   Anesthesia Other Findings   Reproductive/Obstetrics                             Anesthesia Physical Anesthesia Plan  ASA: III  Anesthesia Plan: General   Post-op Pain Management:    Induction: Intravenous  Airway Management Planned: Natural Airway and Mask  Additional Equipment:   Intra-op Plan:   Post-operative Plan:   Informed Consent: I have reviewed the patients History and Physical, chart, labs and discussed the procedure including the risks, benefits and alternatives for the proposed anesthesia with the patient or authorized representative who has indicated his/her understanding and acceptance.     Plan Discussed with: CRNA  Anesthesia Plan Comments:         Anesthesia Quick Evaluation

## 2016-04-15 LAB — GLUCOSE, CAPILLARY: Glucose-Capillary: 90 mg/dL (ref 65–99)

## 2016-04-15 LAB — POCT PREGNANCY, URINE: Preg Test, Ur: NEGATIVE

## 2016-04-19 ENCOUNTER — Encounter: Payer: Self-pay | Admitting: Anesthesiology

## 2016-04-19 ENCOUNTER — Encounter (HOSPITAL_BASED_OUTPATIENT_CLINIC_OR_DEPARTMENT_OTHER)
Admission: RE | Admit: 2016-04-19 | Discharge: 2016-04-19 | Disposition: A | Discharge status: Home / Self Care | Payer: Medicare PPO | Source: Ambulatory Visit | Attending: Psychiatry | Admitting: Psychiatry

## 2016-04-19 ENCOUNTER — Other Ambulatory Visit: Payer: Self-pay | Admitting: Psychiatry

## 2016-04-19 DIAGNOSIS — F332 Major depressive disorder, recurrent severe without psychotic features: Secondary | ICD-10-CM | POA: Diagnosis not present

## 2016-04-19 DIAGNOSIS — K219 Gastro-esophageal reflux disease without esophagitis: Secondary | ICD-10-CM | POA: Diagnosis not present

## 2016-04-19 DIAGNOSIS — E7800 Pure hypercholesterolemia, unspecified: Secondary | ICD-10-CM | POA: Diagnosis not present

## 2016-04-19 DIAGNOSIS — E119 Type 2 diabetes mellitus without complications: Secondary | ICD-10-CM | POA: Diagnosis not present

## 2016-04-19 DIAGNOSIS — E669 Obesity, unspecified: Secondary | ICD-10-CM | POA: Diagnosis not present

## 2016-04-19 DIAGNOSIS — I10 Essential (primary) hypertension: Secondary | ICD-10-CM | POA: Diagnosis not present

## 2016-04-19 LAB — POCT PREGNANCY, URINE: Preg Test, Ur: NEGATIVE

## 2016-04-19 LAB — GLUCOSE, CAPILLARY: Glucose-Capillary: 85 mg/dL (ref 65–99)

## 2016-04-19 MED ORDER — LABETALOL HCL 5 MG/ML IV SOLN
INTRAVENOUS | Status: DC | PRN
  Administered 2016-04-19: 20 mg via INTRAVENOUS

## 2016-04-19 MED ORDER — SUCCINYLCHOLINE CHLORIDE 200 MG/10ML IV SOSY
PREFILLED_SYRINGE | INTRAVENOUS | Status: DC | PRN
  Administered 2016-04-19: 100 mg via INTRAVENOUS

## 2016-04-19 MED ORDER — KETOROLAC TROMETHAMINE 30 MG/ML IJ SOLN
INTRAMUSCULAR | Status: AC
  Administered 2016-04-19: 30 mg via INTRAVENOUS
  Filled 2016-04-19: qty 1

## 2016-04-19 MED ORDER — GLYCOPYRROLATE 0.2 MG/ML IJ SOLN
INTRAMUSCULAR | Status: AC
  Administered 2016-04-19: 0.4 mg via INTRAVENOUS
  Filled 2016-04-19: qty 2

## 2016-04-19 MED ORDER — SODIUM CHLORIDE 0.9 % IV SOLN
INTRAVENOUS | Status: DC | PRN
  Administered 2016-04-19: 10:00:00 via INTRAVENOUS

## 2016-04-19 MED ORDER — ESMOLOL HCL 100 MG/10ML IV SOLN
INTRAVENOUS | Status: DC | PRN
  Administered 2016-04-19: 10 mg via INTRAVENOUS

## 2016-04-19 MED ORDER — SODIUM CHLORIDE 0.9 % IV SOLN
250.0000 mL | Freq: Once | INTRAVENOUS | Status: AC
  Administered 2016-04-19: 500 mL via INTRAVENOUS

## 2016-04-19 MED ORDER — GLYCOPYRROLATE 0.2 MG/ML IJ SOLN
0.4000 mg | Freq: Once | INTRAMUSCULAR | Status: AC
  Administered 2016-04-19: 0.4 mg via INTRAVENOUS

## 2016-04-19 MED ORDER — METHOHEXITAL SODIUM 100 MG/10ML IV SOSY: PREFILLED_SYRINGE | INTRAVENOUS | Status: DC | PRN

## 2016-04-19 MED ORDER — METHOHEXITAL SODIUM 100 MG/10ML IV SOSY
PREFILLED_SYRINGE | INTRAVENOUS | Status: DC | PRN
  Administered 2016-04-19: 70 mg via INTRAVENOUS

## 2016-04-19 MED ORDER — KETOROLAC TROMETHAMINE 30 MG/ML IJ SOLN
30.0000 mg | Freq: Once | INTRAMUSCULAR | Status: AC
  Administered 2016-04-19: 30 mg via INTRAVENOUS

## 2016-04-19 NOTE — Transfer of Care (Signed)
Immediate Anesthesia Transfer of Care Note  Patient: Melinda [name]  Procedure(s) Performed: * No procedures listed *  Patient Location: PACU  Anesthesia Type:General  Level of Consciousness: awake, alert , oriented and patient cooperative  Airway & Oxygen Therapy: Patient Spontanous Breathing and Patient connected to face mask oxygen  Post-op Assessment: Report given to RN, Post -op Vital signs reviewed and stable and Patient moving all extremities X 4  Post vital signs: Reviewed and stable  Last Vitals:  Filed Vitals:   04/19/16 0815  BP: 108/58  Pulse: 93  Temp: 35.8 C  Resp: 18    Last Pain: There were no vitals filed for this visit.       Complications: No apparent anesthesia complications

## 2016-04-19 NOTE — Procedures (Signed)
ECT SERVICES Physician's Interval Evaluation & Treatment Note  Patient Identification: Melinda [name] MRN:  [id number] Date of Evaluation:  04/19/2016 TX #: [id number]  MADRS: 17  MMSE: 29  P.E. Findings:  No change to physical exam vital signs stable  Psychiatric Interval Note:  Mood slightly more down and anxious not severe at all note dangerousness  Subjective:  Patient is a 45 y.o. female seen for evaluation for Electroconvulsive Therapy. No specific new complaint  Treatment Summary:   []   Right Unilateral             [x]  Bilateral   % Energy : 1.0 ms 35%   Impedance: 1390 ohms  Seizure Energy Index: 4277 V squared  Postictal Suppression Index: 87%  Seizure Concordance Index: 97%  Medications  Pre Shock: Robinul 0.4 mg, labetalol 20 mg, esmolol 10 mg, Toradol 30 mg, Brevital 70 mg, succinylcholine 100 mg  Post Shock: None  Seizure Duration: 35 seconds by EMG, 49 seconds by EEG   Comments: Follow-up one week   Lungs:  [x]   Clear to auscultation               []  Other:   Heart:    [x]   Regular rhythm             []  irregular rhythm    [x]   Previous H&P reviewed, patient examined and there are NO CHANGES                 []   Previous H&P reviewed, patient examined and there are changes noted.   [name], MD 6/9/201710:10 AM

## 2016-04-19 NOTE — Anesthesia Postprocedure Evaluation (Signed)
Anesthesia Post Note  Patient: Melinda [name]  Procedure(s) Performed: * No procedures listed *  Patient location during evaluation: PACU Anesthesia Type: General Level of consciousness: awake and alert Pain management: pain level controlled Vital Signs Assessment: post-procedure vital signs reviewed and stable Respiratory status: spontaneous breathing, nonlabored ventilation, respiratory function stable and patient connected to nasal cannula oxygen Cardiovascular status: blood pressure returned to baseline and stable Postop Assessment: no signs of nausea or vomiting Anesthetic complications: no    Last Vitals:  Filed Vitals:   04/19/16 1057 04/19/16 1107  BP: 123/70 114/87  Pulse: 86 80  Temp:    Resp: 19 18    Last Pain: There were no vitals filed for this visit.               Precious Haws Sibley Rolison

## 2016-04-19 NOTE — Anesthesia Preprocedure Evaluation (Signed)
Anesthesia Evaluation  Patient identified by MRN, date of birth, ID band Patient awake    Reviewed: Allergy & Precautions, H&P , NPO status , Patient's Chart, lab work & pertinent test results, reviewed documented beta blocker date and time   History of Anesthesia Complications Negative for: history of anesthetic complications  Airway Mallampati: II  TM Distance: >3 FB     Dental  (+) Chipped   Pulmonary sleep apnea ,           Cardiovascular hypertension, Pt. on medications and Pt. on home beta blockers      Neuro/Psych PSYCHIATRIC DISORDERS Depression Bipolar Disorder  Neuromuscular disease    GI/Hepatic GERD  Controlled,  Endo/Other  diabetes, Type 2Morbid obesity  Renal/GU      Musculoskeletal   Abdominal   Peds  Hematology   Anesthesia Other Findings Past Medical History:   Depression                                                   Diabetes mellitus without complication ([place])                 GERD (gastroesophageal reflux disease)                       Hypercholesterolemia                            03/07/15      Hypertension                                                 Obesity                                         03/07/15      Personality disorder                            03/07/15      Sinus tachycardia ([place])                         03/07/15        Comment:history of   Suicidal thoughts                               03/07/15      Diabetic peripheral neuropathy ([place])            03/07/15      Diabetic peripheral neuropathy ([place])            03/07/15      Diabetic peripheral neuropathy ([place])            03/07/15     Past Surgical History:   electroconvulsion therapy                        03/07/15     BMI    Body Mass Index   40.22 kg/m 2  Reproductive/Obstetrics                             Anesthesia Physical  Anesthesia Plan  ASA: III  Anesthesia Plan: General    Post-op Pain Management:    Induction: Intravenous  Airway Management Planned: Mask  Additional Equipment:   Intra-op Plan:   Post-operative Plan:   Informed Consent: I have reviewed the patients History and Physical, chart, labs and discussed the procedure including the risks, benefits and alternatives for the proposed anesthesia with the patient or authorized representative who has indicated his/her understanding and acceptance.     Plan Discussed with: CRNA  Anesthesia Plan Comments:         Anesthesia Quick Evaluation

## 2016-04-19 NOTE — H&P (Signed)
Melinda [name] is an 45 y.o. female.   Chief Complaint: Chronic major depression HPI: Chronic depression. A little bit more down this week. Has been doing some face picking no suicidal ideation and no psychosis  Past Medical History  Diagnosis Date  . Depression   . Diabetes mellitus without complication ([name])   . GERD (gastroesophageal reflux disease)   . Hypercholesterolemia 03/07/15  . Hypertension   . Obesity 03/07/15  . Personality disorder 03/07/15  . Sinus tachycardia ([place]) 03/07/15    history of  . Suicidal thoughts 03/07/15  . Diabetic peripheral neuropathy ([place]) 03/07/15  . Diabetic peripheral neuropathy ([place]) 03/07/15  . Diabetic peripheral neuropathy ([place]) 03/07/15    Past Surgical History  Procedure Laterality Date  . Electroconvulsion therapy  03/07/15    Family History  Problem Relation Age of Onset  . Hypertension Father   . Diabetes Mother    Social History:  reports that she has never smoked. She has never used smokeless tobacco. She reports that she does not drink alcohol or use illicit drugs.  Allergies:  Allergies  Allergen Reactions  . Prednisone     Increases blood sugar     (Not in a hospital admission)  Results for orders placed or performed during the hospital encounter of 04/19/16 (from the past 48 hour(s))  Pregnancy, urine POC     Status: None   Collection Time: 04/19/16  8:13 AM  Result Value Ref Range   Preg Test, Ur NEGATIVE NEGATIVE    Comment:        THE SENSITIVITY OF THIS METHODOLOGY IS >24 mIU/mL   Glucose, capillary     Status: None   Collection Time: 04/19/16  8:21 AM  Result Value Ref Range   Glucose-Capillary 85 65 - 99 mg/dL   No results found.  Review of Systems  Constitutional: Negative.   HENT: Negative.   Eyes: Negative.   Respiratory: Negative.   Cardiovascular: Negative.   Gastrointestinal: Negative.   Musculoskeletal: Negative.   Skin: Negative.   Neurological: Negative.   Psychiatric/Behavioral: Positive  for depression. Negative for suicidal ideas, hallucinations, memory loss and substance abuse. The patient is not nervous/anxious and does not have insomnia.     Blood pressure 108/58, pulse 93, temperature 96.4 F (35.8 C), resp. rate 18, height 5\' 5"  (1.651 m), weight 100.245 kg (221 lb), last menstrual period 12/08/2015, SpO2 98 %. Physical Exam  Nursing note and vitals reviewed. Constitutional: She appears well-developed and well-nourished.  HENT:  Head: Normocephalic and atraumatic.  Eyes: Conjunctivae are normal. Pupils are equal, round, and reactive to light.  Neck: Normal range of motion.  Cardiovascular: Normal rate and normal heart sounds.   Respiratory: Effort normal.  GI: Soft.  Musculoskeletal: Normal range of motion.  Neurological: She is alert.  Skin: Skin is warm and dry.  Psychiatric: Her speech is normal and behavior is normal. Judgment and thought content normal. Her affect is blunt. Cognition and memory are normal.     Assessment/Plan Physically doing well. Generally tolerating and getting good benefit from weekly ECT. Treatment today follow-up one week. Patient agreeable.  [name], MD 04/19/2016, 10:07 AM

## 2016-04-19 NOTE — Discharge Instructions (Signed)
1)  The drugs that you have been given will stay in your system until tomorrow so for the       next 24 hours you should not:  A. Drive an automobile  B. Make any legal decisions  C. Drink any alcoholic beverages  2)  You may resume your regular meals upon return home.  3)  A responsible adult must take you home.  Someone should stay with you for a few          hours, then be available by phone for the remainder of the treatment day.  4)  You May experience any of the following symptoms:  Headache, Nausea and a dry mouth (due to the medications you were given),  temporary memory loss and some confusion, or sore muscles (a warm bath  should help this).  If you you experience any of these symptoms let us know on                your return visit.  5)  Report any of the following: any acute discomfort, severe headache, or temperature        greater than 100.5 F.   Also report any unusual redness, swelling, drainage, or pain         at your IV site.    You may report Symptoms to:  [place] at [facility]          Phone: [phone], ECT Department           or Dr. Prescott Gum office [phone]  6)  Your next ECT Treatment is Friday, June 16.  We will call 2 days prior to your scheduled appointment for arrival times.  7)  Nothing to eat or drink after midnight the night before your procedure.  8)  Take lisinopril with a sip of water the morning of your procedure.  9)  Other Instructions: Call [phone] to cancel the morning of your procedure due         to illness or emergency.  10) We will call within 72 hours to assess how you are feeling.

## 2016-04-26 ENCOUNTER — Other Ambulatory Visit: Payer: Self-pay | Admitting: Psychiatry

## 2016-04-26 ENCOUNTER — Encounter: Payer: Self-pay | Admitting: Anesthesiology

## 2016-04-26 ENCOUNTER — Encounter (HOSPITAL_BASED_OUTPATIENT_CLINIC_OR_DEPARTMENT_OTHER)
Admission: RE | Admit: 2016-04-26 | Discharge: 2016-04-26 | Disposition: A | Discharge status: Home / Self Care | Payer: Medicare PPO | Source: Ambulatory Visit | Attending: Psychiatry | Admitting: Psychiatry

## 2016-04-26 DIAGNOSIS — E7800 Pure hypercholesterolemia, unspecified: Secondary | ICD-10-CM | POA: Diagnosis not present

## 2016-04-26 DIAGNOSIS — I10 Essential (primary) hypertension: Secondary | ICD-10-CM | POA: Diagnosis not present

## 2016-04-26 DIAGNOSIS — E669 Obesity, unspecified: Secondary | ICD-10-CM | POA: Diagnosis not present

## 2016-04-26 DIAGNOSIS — K219 Gastro-esophageal reflux disease without esophagitis: Secondary | ICD-10-CM | POA: Diagnosis not present

## 2016-04-26 DIAGNOSIS — E119 Type 2 diabetes mellitus without complications: Secondary | ICD-10-CM | POA: Diagnosis not present

## 2016-04-26 DIAGNOSIS — F332 Major depressive disorder, recurrent severe without psychotic features: Secondary | ICD-10-CM

## 2016-04-26 LAB — GLUCOSE, CAPILLARY: Glucose-Capillary: 83 mg/dL (ref 65–99)

## 2016-04-26 LAB — POCT PREGNANCY, URINE: Preg Test, Ur: NEGATIVE

## 2016-04-26 MED ORDER — KETOROLAC TROMETHAMINE 30 MG/ML IJ SOLN
30.0000 mg | Freq: Once | INTRAMUSCULAR | Status: AC
  Administered 2016-04-26: 30 mg via INTRAVENOUS

## 2016-04-26 MED ORDER — SODIUM CHLORIDE 0.9 % IV SOLN
INTRAVENOUS | Status: DC | PRN
  Administered 2016-04-26: 10:00:00 via INTRAVENOUS

## 2016-04-26 MED ORDER — LABETALOL HCL 5 MG/ML IV SOLN
INTRAVENOUS | Status: DC | PRN
  Administered 2016-04-26: 20 mg via INTRAVENOUS

## 2016-04-26 MED ORDER — GLYCOPYRROLATE 0.2 MG/ML IJ SOLN
INTRAMUSCULAR | Status: AC
  Administered 2016-04-26: 0.4 mg via INTRAVENOUS
  Filled 2016-04-26: qty 2

## 2016-04-26 MED ORDER — ONDANSETRON HCL 4 MG/2ML IJ SOLN: 4.0000 mg | Freq: Once | INTRAMUSCULAR | Status: DC | PRN

## 2016-04-26 MED ORDER — ESMOLOL HCL 100 MG/10ML IV SOLN
INTRAVENOUS | Status: DC | PRN
  Administered 2016-04-26: 20 mg via INTRAVENOUS

## 2016-04-26 MED ORDER — FENTANYL CITRATE (PF) 100 MCG/2ML IJ SOLN: 25.0000 ug | INTRAMUSCULAR | Status: DC | PRN

## 2016-04-26 MED ORDER — GLYCOPYRROLATE 0.2 MG/ML IJ SOLN
0.4000 mg | Freq: Once | INTRAMUSCULAR | Status: AC
  Administered 2016-04-26: 0.4 mg via INTRAVENOUS

## 2016-04-26 MED ORDER — SUCCINYLCHOLINE CHLORIDE 20 MG/ML IJ SOLN
INTRAMUSCULAR | Status: DC | PRN
  Administered 2016-04-26: 100 mg via INTRAVENOUS

## 2016-04-26 MED ORDER — KETOROLAC TROMETHAMINE 30 MG/ML IJ SOLN
INTRAMUSCULAR | Status: AC
  Administered 2016-04-26: 30 mg via INTRAVENOUS
  Filled 2016-04-26: qty 1

## 2016-04-26 MED ORDER — SODIUM CHLORIDE 0.9 % IV SOLN
250.0000 mL | Freq: Once | INTRAVENOUS | Status: AC
  Administered 2016-04-26: 500 mL via INTRAVENOUS

## 2016-04-26 MED ORDER — METHOHEXITAL SODIUM 100 MG/10ML IV SOSY
PREFILLED_SYRINGE | INTRAVENOUS | Status: DC | PRN
  Administered 2016-04-26: 70 mg via INTRAVENOUS

## 2016-04-26 NOTE — H&P (Signed)
Melinda [name] is an 45 y.o. female.   Chief Complaint: No specific complaints. Mental status stable. Not depressed.  HPI: Long-standing severe recurrent depression. Does well with maintenance ECT.  Past Medical History  Diagnosis Date  . Depression   . Diabetes mellitus without complication ([place])   . GERD (gastroesophageal reflux disease)   . Hypercholesterolemia 03/07/15  . Hypertension   . Obesity 03/07/15  . Personality disorder 03/07/15  . Sinus tachycardia ([place]) 03/07/15    history of  . Suicidal thoughts 03/07/15  . Diabetic peripheral neuropathy ([place]) 03/07/15  . Diabetic peripheral neuropathy ([place]) 03/07/15  . Diabetic peripheral neuropathy ([place]) 03/07/15    Past Surgical History  Procedure Laterality Date  . Electroconvulsion therapy  03/07/15    Family History  Problem Relation Age of Onset  . Hypertension Father   . Diabetes Mother    Social History:  reports that she has never smoked. She has never used smokeless tobacco. She reports that she does not drink alcohol or use illicit drugs.  Allergies:  Allergies  Allergen Reactions  . Prednisone     Increases blood sugar     (Not in a hospital admission)  Results for orders placed or performed during the hospital encounter of 04/26/16 (from the past 48 hour(s))  Pregnancy, urine POC     Status: None   Collection Time: 04/26/16  8:35 AM  Result Value Ref Range   Preg Test, Ur NEGATIVE NEGATIVE    Comment:        THE SENSITIVITY OF THIS METHODOLOGY IS >24 mIU/mL   Glucose, capillary     Status: None   Collection Time: 04/26/16  8:43 AM  Result Value Ref Range   Glucose-Capillary 83 65 - 99 mg/dL   No results found.  Review of Systems  Constitutional: Negative.   HENT: Negative.   Eyes: Negative.   Respiratory: Negative.   Cardiovascular: Negative.   Gastrointestinal: Negative.   Musculoskeletal: Negative.   Skin: Negative.   Neurological: Negative.   Psychiatric/Behavioral: Negative for  depression, suicidal ideas, hallucinations, memory loss and substance abuse. The patient is not nervous/anxious and does not have insomnia.     Blood pressure 141/84, pulse 82, temperature 96.2 F (35.7 C), temperature source Tympanic, height 5\' 5"  (1.651 m), weight 101.152 kg (223 lb), last menstrual period 03/27/2016, SpO2 100 %. Physical Exam  Nursing note and vitals reviewed. Constitutional: She appears well-developed and well-nourished.  HENT:  Head: Normocephalic and atraumatic.  Eyes: Conjunctivae are normal. Pupils are equal, round, and reactive to light.  Neck: Normal range of motion.  Cardiovascular: Normal rate and normal heart sounds.   Respiratory: Effort normal. No respiratory distress.  GI: Soft.  Musculoskeletal: Normal range of motion.  Neurological: She is alert.  Skin: Skin is warm and dry.  Psychiatric: She has a normal mood and affect. Her behavior is normal. Judgment and thought content normal.     Assessment/Plan Treatment today in follow-up next week.  [name], MD 04/26/2016, 10:14 AM

## 2016-04-26 NOTE — Procedures (Signed)
ECT SERVICES Physician's Interval Evaluation & Treatment Note  Patient Identification: Melinda [name] MRN:  [id number] Date of Evaluation:  04/26/2016 TX #: [id number]  MADRS:   MMSE:   P.E. Findings:  No change to physical exam. Vital signs stable. Heart normal lungs normal  Psychiatric Interval Note:  Mood is stable no depression no self-mutilation  Subjective:  Patient is a 45 y.o. female seen for evaluation for Electroconvulsive Therapy. No specific complaint  Treatment Summary:   [x]   Right Unilateral             [x]  Bilateral   % Energy : 1.0 ms 35%   Impedance: 900 ohms  Seizure Energy Index: 3924 V squared  Postictal Suppression Index: 91%  Seizure Concordance Index: 98%  Medications  Pre Shock: Robinul 0.4 mg, labetalol 20 mg, esmolol 10 mg, Toradol 30 mg, Brevital 70 mg, succinylcholine 100 mg  Post Shock:    Seizure Duration: 49 seconds by EMG, 100 seconds by EEG   Comments: Follow-up one week usual schedule   Lungs:  [x]   Clear to auscultation               []  Other:   Heart:    [x]   Regular rhythm             []  irregular rhythm    [x]   Previous H&P reviewed, patient examined and there are NO CHANGES                 []   Previous H&P reviewed, patient examined and there are changes noted.   [name], MD 6/16/201710:15 AM

## 2016-04-26 NOTE — Anesthesia Preprocedure Evaluation (Signed)
Anesthesia Evaluation  Patient identified by MRN, date of birth, ID band Patient awake    Reviewed: Allergy & Precautions, H&P , NPO status , Patient's Chart, lab work & pertinent test results, reviewed documented beta blocker date and time   History of Anesthesia Complications Negative for: history of anesthetic complications  Airway Mallampati: II  TM Distance: >3 FB     Dental  (+) Chipped   Pulmonary sleep apnea ,           Cardiovascular hypertension, Pt. on medications and Pt. on home beta blockers      Neuro/Psych PSYCHIATRIC DISORDERS Depression Bipolar Disorder  Neuromuscular disease    GI/Hepatic GERD  Controlled,  Endo/Other  diabetes, Type 2Morbid obesity  Renal/GU      Musculoskeletal   Abdominal   Peds  Hematology   Anesthesia Other Findings Past Medical History:   Depression                                                   Diabetes mellitus without complication ([place])                 GERD (gastroesophageal reflux disease)                       Hypercholesterolemia                            03/07/15      Hypertension                                                 Obesity                                         03/07/15      Personality disorder                            03/07/15      Sinus tachycardia ([place])                         03/07/15        Comment:history of   Suicidal thoughts                               03/07/15      Diabetic peripheral neuropathy ([place])            03/07/15      Diabetic peripheral neuropathy ([place])            03/07/15      Diabetic peripheral neuropathy ([place])            03/07/15     Past Surgical History:   electroconvulsion therapy                        03/07/15     BMI    Body Mass Index   40.22 kg/m 2  Reproductive/Obstetrics                             Anesthesia Physical  Anesthesia Plan  ASA: III  Anesthesia Plan: General    Post-op Pain Management:    Induction: Intravenous  Airway Management Planned: Mask  Additional Equipment:   Intra-op Plan:   Post-operative Plan:   Informed Consent: I have reviewed the patients History and Physical, chart, labs and discussed the procedure including the risks, benefits and alternatives for the proposed anesthesia with the patient or authorized representative who has indicated his/her understanding and acceptance.     Plan Discussed with: CRNA  Anesthesia Plan Comments:         Anesthesia Quick Evaluation

## 2016-04-26 NOTE — Discharge Instructions (Signed)
1)  The drugs that you have been given will stay in your system until tomorrow so for the       next 24 hours you should not:  A. Drive an automobile  B. Make any legal decisions  C. Drink any alcoholic beverages  2)  You may resume your regular meals upon return home.  3)  A responsible adult must take you home.  Someone should stay with you for a few          hours, then be available by phone for the remainder of the treatment day.  4)  You May experience any of the following symptoms:  Headache, Nausea and a dry mouth (due to the medications you were given),  temporary memory loss and some confusion, or sore muscles (a warm bath  should help this).  If you you experience any of these symptoms let us know on                your return visit.  5)  Report any of the following: any acute discomfort, severe headache, or temperature        greater than 100.5 F.   Also report any unusual redness, swelling, drainage, or pain         at your IV site.    You may report Symptoms to:  [place] at [facility]          Phone: [phone], ECT Department           or Dr. Prescott Gum office [phone]  6)  Your next ECT Treatment is Day Friday  Date June 23  We will call 2 days prior to your scheduled appointment for arrival times.  7)  Nothing to eat or drink after midnight the night before your procedure.  8)  Take Lisinopril    With a sip of water the morning of your procedure.  9)  Other Instructions: Call [phone] to cancel the morning of your procedure due         to illness or emergency.  10) We will call within 72 hours to assess how you are feeling.

## 2016-04-26 NOTE — Transfer of Care (Signed)
Immediate Anesthesia Transfer of Care Note  Patient: Melinda [name]  Procedure(s) Performed: * No procedures listed *  Patient Location: PACU  Anesthesia Type:General  Level of Consciousness: patient cooperative and lethargic  Airway & Oxygen Therapy: Patient Spontanous Breathing and Patient connected to face mask oxygen  Post-op Assessment: Report given to RN and Post -op Vital signs reviewed and stable  Post vital signs: Reviewed and stable  Last Vitals:  Filed Vitals:   04/26/16 0833 04/26/16 1033  BP: 141/84 134/79  Pulse: 82   Temp: 35.7 C   Resp:  20    Last Pain:  Filed Vitals:   04/26/16 1035  PainSc: 0-No pain      Patients Stated Pain Goal: 0 ([id number])  Complications: No apparent anesthesia complications

## 2016-04-26 NOTE — Anesthesia Postprocedure Evaluation (Signed)
Anesthesia Post Note  Patient: Melinda [name]  Procedure(s) Performed: * No procedures listed *  Patient location during evaluation: PACU Anesthesia Type: General Level of consciousness: awake and alert and oriented Pain management: pain level controlled Vital Signs Assessment: post-procedure vital signs reviewed and stable Respiratory status: spontaneous breathing Cardiovascular status: blood pressure returned to baseline Anesthetic complications: no    Last Vitals:  Filed Vitals:   04/26/16 1101 04/26/16 1117  BP: 122/81 140/84  Pulse: 79 81  Temp:    Resp: 22 16    Last Pain:  Filed Vitals:   04/26/16 1119  PainSc: 8                  Macaila Tahir

## 2016-05-03 ENCOUNTER — Encounter: Payer: Self-pay | Admitting: Anesthesiology

## 2016-05-03 ENCOUNTER — Encounter
Admission: RE | Admit: 2016-05-03 | Discharge: 2016-05-03 | Disposition: A | Discharge status: Home / Self Care | Payer: Medicare PPO | Source: Ambulatory Visit | Attending: Family Medicine | Admitting: Family Medicine

## 2016-05-03 DIAGNOSIS — E7800 Pure hypercholesterolemia, unspecified: Secondary | ICD-10-CM | POA: Diagnosis not present

## 2016-05-03 DIAGNOSIS — E119 Type 2 diabetes mellitus without complications: Secondary | ICD-10-CM | POA: Insufficient documentation

## 2016-05-03 DIAGNOSIS — F332 Major depressive disorder, recurrent severe without psychotic features: Secondary | ICD-10-CM

## 2016-05-03 DIAGNOSIS — I10 Essential (primary) hypertension: Secondary | ICD-10-CM | POA: Diagnosis not present

## 2016-05-03 DIAGNOSIS — E669 Obesity, unspecified: Secondary | ICD-10-CM | POA: Insufficient documentation

## 2016-05-03 DIAGNOSIS — K219 Gastro-esophageal reflux disease without esophagitis: Secondary | ICD-10-CM | POA: Insufficient documentation

## 2016-05-03 LAB — POCT PREGNANCY, URINE: Preg Test, Ur: NEGATIVE

## 2016-05-03 LAB — GLUCOSE, CAPILLARY: Glucose-Capillary: 105 mg/dL — ABNORMAL HIGH (ref 65–99)

## 2016-05-03 MED ORDER — GLYCOPYRROLATE 0.2 MG/ML IJ SOLN
INTRAMUSCULAR | Status: AC
  Administered 2016-05-03: 0.4 mg via INTRAVENOUS
  Filled 2016-05-03: qty 2

## 2016-05-03 MED ORDER — SODIUM CHLORIDE 0.9 % IV SOLN
250.0000 mL | Freq: Once | INTRAVENOUS | Status: AC
  Administered 2016-05-03: 500 mL via INTRAVENOUS

## 2016-05-03 MED ORDER — KETOROLAC TROMETHAMINE 30 MG/ML IJ SOLN
30.0000 mg | Freq: Once | INTRAMUSCULAR | Status: AC
  Administered 2016-05-03: 30 mg via INTRAVENOUS

## 2016-05-03 MED ORDER — SUCCINYLCHOLINE CHLORIDE 20 MG/ML IJ SOLN
INTRAMUSCULAR | Status: DC | PRN
  Administered 2016-05-03: 100 mg via INTRAVENOUS

## 2016-05-03 MED ORDER — KETOROLAC TROMETHAMINE 30 MG/ML IJ SOLN
INTRAMUSCULAR | Status: AC
  Administered 2016-05-03: 30 mg via INTRAVENOUS
  Filled 2016-05-03: qty 1

## 2016-05-03 MED ORDER — ESMOLOL HCL 100 MG/10ML IV SOLN
INTRAVENOUS | Status: DC | PRN
  Administered 2016-05-03: 10 mg via INTRAVENOUS

## 2016-05-03 MED ORDER — LABETALOL HCL 5 MG/ML IV SOLN
INTRAVENOUS | Status: DC | PRN
  Administered 2016-05-03: 20 mg via INTRAVENOUS

## 2016-05-03 MED ORDER — SODIUM CHLORIDE 0.9 % IV SOLN
INTRAVENOUS | Status: DC | PRN
  Administered 2016-05-03: 10:00:00 via INTRAVENOUS

## 2016-05-03 MED ORDER — GLYCOPYRROLATE 0.2 MG/ML IJ SOLN
0.4000 mg | Freq: Once | INTRAMUSCULAR | Status: AC
  Administered 2016-05-03: 0.4 mg via INTRAVENOUS

## 2016-05-03 MED ORDER — METHOHEXITAL SODIUM 100 MG/10ML IV SOSY
PREFILLED_SYRINGE | INTRAVENOUS | Status: DC | PRN
  Administered 2016-05-03: 70 mg via INTRAVENOUS

## 2016-05-03 NOTE — Procedures (Signed)
ECT SERVICES Physician's Interval Evaluation & Treatment Note  Patient Identification: Melinda [name] MRN:  [id number] Date of Evaluation:  05/03/2016 TX #: [id number]  MADRS:   MMSE:   P.E. Findings:  No change to physical exam. Vitals stable. Heart normal lungs clear.  Psychiatric Interval Note:  Mood is stated as being okay. Affect slightly more constricted but still reactive no suicidal ideation or behavior.  Subjective:  Patient is a 45 y.o. female seen for evaluation for Electroconvulsive Therapy. Patient has no specific complaint.  Treatment Summary:   []   Right Unilateral             [x]  Bilateral   % Energy : 1.0 ms, 35%   Impedance: 1540 ohms  Seizure Energy Index: 1532 V squared  Postictal Suppression Index: Less than 10%  Seizure Concordance Index: 78%  Medications  Pre Shock: Robinul 0.4 mg, Toradol 30 mg, labetalol 20 mg, esmolol 10 mg, Brevital 70 mg, succinylcholine 100 mg  Post Shock:    Seizure Duration: 35 seconds by EMG 38 seconds by EEG   Comments: Continue weekly treatment follow-up next week June 30   Lungs:  [x]   Clear to auscultation               []  Other:   Heart:    [x]   Regular rhythm             []  irregular rhythm    [x]   Previous H&P reviewed, patient examined and there are NO CHANGES                 []   Previous H&P reviewed, patient examined and there are changes noted.   [name], MD 6/23/201710:25 AM

## 2016-05-03 NOTE — Discharge Instructions (Signed)
1)  The drugs that you have been given will stay in your system until tomorrow so for the       next 24 hours you should not:  A. Drive an automobile  B. Make any legal decisions  C. Drink any alcoholic beverages  2)  You may resume your regular meals upon return home.  3)  A responsible adult must take you home.  Someone should stay with you for a few          hours, then be available by phone for the remainder of the treatment day.  4)  You May experience any of the following symptoms:  Headache, Nausea and a dry mouth (due to the medications you were given),  temporary memory loss and some confusion, or sore muscles (a warm bath  should help this).  If you you experience any of these symptoms let us know on                your return visit.  5)  Report any of the following: any acute discomfort, severe headache, or temperature        greater than 100.5 F.   Also report any unusual redness, swelling, drainage, or pain         at your IV site.    You may report Symptoms to:  [place] at [facility]          Phone: [phone], ECT Department           or Dr. Prescott Gum office [phone]  6)  Your next ECT Treatment is Friday, June 30. At 8:00a. 7)  Take your lisinopril with a sip of water on Friday morning early.

## 2016-05-03 NOTE — Anesthesia Preprocedure Evaluation (Signed)
Anesthesia Evaluation  Patient identified by MRN, date of birth, ID band Patient awake    Reviewed: Allergy & Precautions, H&P , NPO status , Patient's Chart, lab work & pertinent test results, reviewed documented beta blocker date and time   History of Anesthesia Complications Negative for: history of anesthetic complications  Airway Mallampati: II  TM Distance: >3 FB     Dental  (+) Chipped   Pulmonary sleep apnea ,           Cardiovascular hypertension, Pt. on medications and Pt. on home beta blockers      Neuro/Psych PSYCHIATRIC DISORDERS Depression Bipolar Disorder  Neuromuscular disease    GI/Hepatic GERD  Controlled,  Endo/Other  diabetes, Type 2Morbid obesity  Renal/GU      Musculoskeletal   Abdominal   Peds  Hematology   Anesthesia Other Findings Past Medical History:   Depression                                                   Diabetes mellitus without complication ([place])                 GERD (gastroesophageal reflux disease)                       Hypercholesterolemia                            03/07/15      Hypertension                                                 Obesity                                         03/07/15      Personality disorder                            03/07/15      Sinus tachycardia ([place])                         03/07/15        Comment:history of   Suicidal thoughts                               03/07/15      Diabetic peripheral neuropathy ([place])            03/07/15      Diabetic peripheral neuropathy ([place])            03/07/15      Diabetic peripheral neuropathy ([place])            03/07/15     Past Surgical History:   electroconvulsion therapy                        03/07/15     BMI    Body Mass Index   40.22 kg/m 2  Reproductive/Obstetrics                             Anesthesia Physical  Anesthesia Plan  ASA: III  Anesthesia Plan: General    Post-op Pain Management:    Induction: Intravenous  Airway Management Planned: Mask  Additional Equipment:   Intra-op Plan:   Post-operative Plan:   Informed Consent: I have reviewed the patients History and Physical, chart, labs and discussed the procedure including the risks, benefits and alternatives for the proposed anesthesia with the patient or authorized representative who has indicated his/her understanding and acceptance.     Plan Discussed with: CRNA  Anesthesia Plan Comments:         Anesthesia Quick Evaluation

## 2016-05-03 NOTE — Anesthesia Postprocedure Evaluation (Signed)
Anesthesia Post Note  Patient: Melinda [name]  Procedure(s) Performed: * No procedures listed *  Patient location during evaluation: PACU Anesthesia Type: General Level of consciousness: awake and alert Pain management: pain level controlled Vital Signs Assessment: post-procedure vital signs reviewed and stable Respiratory status: spontaneous breathing and respiratory function stable Cardiovascular status: stable Anesthetic complications: no    Last Vitals:  Filed Vitals:   05/03/16 1046 05/03/16 1056  BP: 155/59 124/75  Pulse: 93 92  Temp:  37.2 C  Resp: 20 17    Last Pain:  Filed Vitals:   05/03/16 1059  PainSc: Asleep                 Dreamer Carillo K

## 2016-05-03 NOTE — H&P (Signed)
Melinda [name] is an 45 y.o. female.   Chief Complaint: Patient has no specific new complaint. Mood is stable. Perhaps a little bit down but no self-mutilation. No specific problems. HPI: Chronic depression stable with maintenance ECT and medication.  Past Medical History  Diagnosis Date  . Depression   . Diabetes mellitus without complication ([place])   . GERD (gastroesophageal reflux disease)   . Hypercholesterolemia 03/07/15  . Hypertension   . Obesity 03/07/15  . Personality disorder 03/07/15  . Sinus tachycardia ([place]) 03/07/15    history of  . Suicidal thoughts 03/07/15  . Diabetic peripheral neuropathy ([place]) 03/07/15  . Diabetic peripheral neuropathy ([place]) 03/07/15  . Diabetic peripheral neuropathy ([place]) 03/07/15    Past Surgical History  Procedure Laterality Date  . Electroconvulsion therapy  03/07/15    Family History  Problem Relation Age of Onset  . Hypertension Father   . Diabetes Mother    Social History:  reports that she has never smoked. She has never used smokeless tobacco. She reports that she does not drink alcohol or use illicit drugs.  Allergies:  Allergies  Allergen Reactions  . Prednisone     Increases blood sugar     (Not in a hospital admission)  Results for orders placed or performed during the hospital encounter of 05/03/16 (from the past 48 hour(s))  Pregnancy, urine POC     Status: None   Collection Time: 05/03/16  9:14 AM  Result Value Ref Range   Preg Test, Ur NEGATIVE NEGATIVE    Comment:        THE SENSITIVITY OF THIS METHODOLOGY IS >24 mIU/mL   Glucose, capillary     Status: Abnormal   Collection Time: 05/03/16  9:21 AM  Result Value Ref Range   Glucose-Capillary 105 (H) 65 - 99 mg/dL   No results found.  Review of Systems  Constitutional: Negative.   HENT: Negative.   Eyes: Negative.   Respiratory: Negative.   Cardiovascular: Negative.   Gastrointestinal: Negative.   Musculoskeletal: Negative.   Skin: Negative.    Neurological: Negative.   Psychiatric/Behavioral: Negative for depression, suicidal ideas, hallucinations, memory loss and substance abuse. The patient is not nervous/anxious and does not have insomnia.     Blood pressure 131/77, pulse 87, temperature 98.7 F (37.1 C), temperature source Tympanic, resp. rate 16, height 5\' 5"  (1.651 m), weight 100.245 kg (221 lb), last menstrual period 03/27/2016, SpO2 99 %. Physical Exam  Nursing note and vitals reviewed. Constitutional: She appears well-developed and well-nourished.  HENT:  Head: Normocephalic and atraumatic.  Eyes: Conjunctivae are normal. Pupils are equal, round, and reactive to light.  Neck: Normal range of motion.  Cardiovascular: Normal heart sounds.   Respiratory: Effort normal.  GI: Soft.  Musculoskeletal: Normal range of motion.  Neurological: She is alert.  Skin: Skin is warm and dry.  Psychiatric: She has a normal mood and affect. Her speech is normal and behavior is normal. Judgment and thought content normal. Cognition and memory are normal.     Assessment/Plan Continue weekly treatment which is well tolerated and beneficial.  [name], MD 05/03/2016, 10:09 AM

## 2016-05-03 NOTE — Transfer of Care (Signed)
Immediate Anesthesia Transfer of Care Note  Patient: Melinda [name]  Procedure(s) Performed: * No procedures listed *  Patient Location: PACU  Anesthesia Type:General  Level of Consciousness: awake, alert , oriented and patient cooperative  Airway & Oxygen Therapy: Patient Spontanous Breathing and Patient connected to face mask oxygen  Post-op Assessment: Report given to RN, Post -op Vital signs reviewed and stable and Patient moving all extremities X 4  Post vital signs: Reviewed and stable  Last Vitals:  Filed Vitals:   05/03/16 0903  BP: 131/77  Pulse: 87  Temp: 37.1 C  Resp: 16    Last Pain: There were no vitals filed for this visit.       Complications: No apparent anesthesia complications

## 2016-05-10 ENCOUNTER — Other Ambulatory Visit: Payer: Self-pay | Admitting: Psychiatry

## 2016-05-10 ENCOUNTER — Encounter: Payer: Self-pay | Admitting: Anesthesiology

## 2016-05-10 ENCOUNTER — Encounter (HOSPITAL_BASED_OUTPATIENT_CLINIC_OR_DEPARTMENT_OTHER)
Admission: RE | Admit: 2016-05-10 | Discharge: 2016-05-10 | Disposition: A | Discharge status: Home / Self Care | Payer: Medicare PPO | Source: Ambulatory Visit | Attending: Psychiatry | Admitting: Psychiatry

## 2016-05-10 DIAGNOSIS — K219 Gastro-esophageal reflux disease without esophagitis: Secondary | ICD-10-CM | POA: Diagnosis not present

## 2016-05-10 DIAGNOSIS — F332 Major depressive disorder, recurrent severe without psychotic features: Secondary | ICD-10-CM

## 2016-05-10 DIAGNOSIS — E119 Type 2 diabetes mellitus without complications: Secondary | ICD-10-CM | POA: Diagnosis not present

## 2016-05-10 DIAGNOSIS — I10 Essential (primary) hypertension: Secondary | ICD-10-CM | POA: Diagnosis not present

## 2016-05-10 DIAGNOSIS — E7800 Pure hypercholesterolemia, unspecified: Secondary | ICD-10-CM | POA: Diagnosis not present

## 2016-05-10 DIAGNOSIS — F319 Bipolar disorder, unspecified: Secondary | ICD-10-CM | POA: Diagnosis not present

## 2016-05-10 DIAGNOSIS — E669 Obesity, unspecified: Secondary | ICD-10-CM | POA: Diagnosis not present

## 2016-05-10 LAB — POCT PREGNANCY, URINE: Preg Test, Ur: NEGATIVE

## 2016-05-10 LAB — GLUCOSE, CAPILLARY: Glucose-Capillary: 92 mg/dL (ref 65–99)

## 2016-05-10 MED ORDER — SODIUM CHLORIDE 0.9 % IV SOLN
INTRAVENOUS | Status: DC | PRN
  Administered 2016-05-10: 10:00:00 via INTRAVENOUS

## 2016-05-10 MED ORDER — GLYCOPYRROLATE 0.2 MG/ML IJ SOLN
0.4000 mg | Freq: Once | INTRAMUSCULAR | Status: AC
  Administered 2016-05-10: 0.4 mg via INTRAVENOUS

## 2016-05-10 MED ORDER — SUCCINYLCHOLINE CHLORIDE 200 MG/10ML IV SOSY
PREFILLED_SYRINGE | INTRAVENOUS | Status: DC | PRN
  Administered 2016-05-10: 100 mg via INTRAVENOUS

## 2016-05-10 MED ORDER — ESMOLOL HCL 100 MG/10ML IV SOLN
INTRAVENOUS | Status: DC | PRN
  Administered 2016-05-10: 10 mg via INTRAVENOUS

## 2016-05-10 MED ORDER — KETOROLAC TROMETHAMINE 30 MG/ML IJ SOLN
INTRAMUSCULAR | Status: AC
  Filled 2016-05-10: qty 1

## 2016-05-10 MED ORDER — KETOROLAC TROMETHAMINE 30 MG/ML IJ SOLN
30.0000 mg | Freq: Once | INTRAMUSCULAR | Status: AC
  Administered 2016-05-10: 30 mg via INTRAVENOUS

## 2016-05-10 MED ORDER — GLYCOPYRROLATE 0.2 MG/ML IJ SOLN
INTRAMUSCULAR | Status: AC
  Administered 2016-05-10: 0.4 mg via INTRAVENOUS
  Filled 2016-05-10: qty 2

## 2016-05-10 MED ORDER — METHOHEXITAL SODIUM 100 MG/10ML IV SOSY
PREFILLED_SYRINGE | INTRAVENOUS | Status: DC | PRN
  Administered 2016-05-10: 70 mg via INTRAVENOUS

## 2016-05-10 MED ORDER — LABETALOL HCL 5 MG/ML IV SOLN
INTRAVENOUS | Status: DC | PRN
  Administered 2016-05-10: 20 mg via INTRAVENOUS

## 2016-05-10 MED ORDER — SODIUM CHLORIDE 0.9 % IV SOLN
250.0000 mL | Freq: Once | INTRAVENOUS | Status: AC
  Administered 2016-05-10: 500 mL via INTRAVENOUS

## 2016-05-10 NOTE — Anesthesia Postprocedure Evaluation (Signed)
Anesthesia Post Note  Patient: Melinda [name]  Procedure(s) Performed: * No procedures listed *  Patient location during evaluation: PACU Anesthesia Type: General Level of consciousness: awake and alert Pain management: pain level controlled Vital Signs Assessment: post-procedure vital signs reviewed and stable Respiratory status: spontaneous breathing, nonlabored ventilation, respiratory function stable and patient connected to nasal cannula oxygen Cardiovascular status: blood pressure returned to baseline and stable Postop Assessment: no signs of nausea or vomiting Anesthetic complications: no    Last Vitals:  Filed Vitals:   05/10/16 1110 05/10/16 1116  BP: 128/62 131/67  Pulse: 92 88  Temp:    Resp: 20 18    Last Pain: There were no vitals filed for this visit.               Ty Buntrock S

## 2016-05-10 NOTE — Anesthesia Preprocedure Evaluation (Signed)
Anesthesia Evaluation  Patient identified by MRN, date of birth, ID band Patient awake    Reviewed: Allergy & Precautions, H&P , NPO status , Patient's Chart, lab work & pertinent test results, reviewed documented beta blocker date and time   History of Anesthesia Complications Negative for: history of anesthetic complications  Airway Mallampati: II  TM Distance: >3 FB     Dental  (+) Chipped   Pulmonary sleep apnea ,           Cardiovascular hypertension, Pt. on medications and Pt. on home beta blockers      Neuro/Psych PSYCHIATRIC DISORDERS Depression Bipolar Disorder  Neuromuscular disease    GI/Hepatic GERD  Controlled,  Endo/Other  diabetes, Type 2Morbid obesity  Renal/GU      Musculoskeletal   Abdominal   Peds  Hematology   Anesthesia Other Findings Past Medical History:   Depression                                                   Diabetes mellitus without complication ([place])                 GERD (gastroesophageal reflux disease)                       Hypercholesterolemia                            03/07/15      Hypertension                                                 Obesity                                         03/07/15      Personality disorder                            03/07/15      Sinus tachycardia ([place])                         03/07/15        Comment:history of   Suicidal thoughts                               03/07/15      Diabetic peripheral neuropathy ([place])            03/07/15      Diabetic peripheral neuropathy ([place])            03/07/15      Diabetic peripheral neuropathy ([place])            03/07/15     Past Surgical History:   electroconvulsion therapy                        03/07/15     BMI    Body Mass Index   40.22 kg/m 2  Reproductive/Obstetrics                             Anesthesia Physical Anesthesia Plan  ASA: III  Anesthesia Plan: General    Post-op Pain Management:    Induction:   Airway Management Planned: Nasal Cannula  Additional Equipment:   Intra-op Plan:   Post-operative Plan:   Informed Consent:   Plan Discussed with:   Anesthesia Plan Comments:         Anesthesia Quick Evaluation

## 2016-05-10 NOTE — Transfer of Care (Signed)
Immediate Anesthesia Transfer of Care Note  Patient: Melinda [name]  Procedure(s) Performed: ECT  Patient Location: PACU  Anesthesia Type:General  Level of Consciousness: awake and pateint uncooperative  Airway & Oxygen Therapy: Patient Spontanous Breathing and Patient connected to face mask oxygen  Post-op Assessment: Report given to RN  Post vital signs: Reviewed and stable  Last Vitals:  Filed Vitals:   05/10/16 0809 05/10/16 1039  BP: 115/91 96/48  Pulse: 92 99  Temp: 36.6 C 36.6 C  Resp: 18 8    Last Pain: There were no vitals filed for this visit.       Complications: No apparent anesthesia complications

## 2016-05-10 NOTE — H&P (Signed)
Chistina Jerilynn Mages [name] is an 45 y.o. female.   Chief Complaint: Slightly more down this week. Her son is having some legal problems. No suicidal ideation. HPI: Chronic depression responsive to ECT.  Past Medical History  Diagnosis Date  . Depression   . Diabetes mellitus without complication ([place])   . GERD (gastroesophageal reflux disease)   . Hypercholesterolemia 03/07/15  . Hypertension   . Obesity 03/07/15  . Personality disorder 03/07/15  . Sinus tachycardia ([place]) 03/07/15    history of  . Suicidal thoughts 03/07/15  . Diabetic peripheral neuropathy ([place]) 03/07/15  . Diabetic peripheral neuropathy ([place]) 03/07/15  . Diabetic peripheral neuropathy ([place]) 03/07/15    Past Surgical History  Procedure Laterality Date  . Electroconvulsion therapy  03/07/15    Family History  Problem Relation Age of Onset  . Hypertension Father   . Diabetes Mother    Social History:  reports that she has never smoked. She has never used smokeless tobacco. She reports that she does not drink alcohol or use illicit drugs.  Allergies:  Allergies  Allergen Reactions  . Prednisone     Increases blood sugar     (Not in a hospital admission)  Results for orders placed or performed during the hospital encounter of 05/10/16 (from the past 48 hour(s))  Glucose, capillary     Status: None   Collection Time: 05/10/16  8:14 AM  Result Value Ref Range   Glucose-Capillary 92 65 - 99 mg/dL  Pregnancy, urine POC     Status: None   Collection Time: 05/10/16  8:17 AM  Result Value Ref Range   Preg Test, Ur NEGATIVE NEGATIVE    Comment:        THE SENSITIVITY OF THIS METHODOLOGY IS >24 mIU/mL    No results found.  Review of Systems  Constitutional: Negative.   HENT: Negative.   Eyes: Negative.   Respiratory: Negative.   Cardiovascular: Negative.   Gastrointestinal: Negative.   Musculoskeletal: Negative.   Skin: Negative.   Neurological: Negative.   Psychiatric/Behavioral: Negative for depression,  suicidal ideas, hallucinations, memory loss and substance abuse. The patient is not nervous/anxious and does not have insomnia.     Blood pressure 115/91, pulse 92, temperature 97.9 F (36.6 C), temperature source Oral, resp. rate 18, height 5\' 5"  (1.651 m), weight 100.245 kg (221 lb), last menstrual period 03/27/2016, SpO2 100 %. Physical Exam  Nursing note and vitals reviewed. Constitutional: She appears well-developed and well-nourished.  HENT:  Head: Normocephalic and atraumatic.  Eyes: Conjunctivae are normal. Pupils are equal, round, and reactive to light.  Neck: Normal range of motion.  Cardiovascular: Regular rhythm and normal heart sounds.   Respiratory: Effort normal.  GI: Soft.  Musculoskeletal: Normal range of motion.  Neurological: She is alert.  Skin: Skin is warm and dry.  Psychiatric: She has a normal mood and affect. Her behavior is normal. Judgment and thought content normal.     Assessment/Plan Next treatment will be in 2 weeks because of our scheduling. Patient is aware of this.  [name], MD 05/10/2016, 10:17 AM

## 2016-05-10 NOTE — Procedures (Signed)
ECT SERVICES Physician's Interval Evaluation & Treatment Note  Patient Identification: Melinda [name] MRN:  [id number] Date of Evaluation:  05/10/2016 TX #: [id number]  MADRS:   MMSE:   P.E. Findings:  No change to physical exam. Vital signs normal. Blood sugar good. Lungs and heart normal.  Psychiatric Interval Note:  Mood a little bit anxious but reactive not very down no suicidal thoughts.  Subjective:  Patient is a 45 y.o. female seen for evaluation for Electroconvulsive Therapy. No really specific new complaints  Treatment Summary:   []   Right Unilateral             [x]  Bilateral   % Energy : 1.0 ms 35%   Impedance: 1390 ohms  Seizure Energy Index: 4315 V squared  Postictal Suppression Index: 67%  Seizure Concordance Index: 97%  Medications  Pre Shock: Robinul 0.4 mg, Toradol 30 mg, labetalol 20 mg, esmolol 10 mg, Brevital 70 mg, succinylcholine 100 mg  Post Shock:    Seizure Duration: 41 seconds by EMG, 102 seconds by EEG   Comments: Follow-up 2 weeks on July 14   Lungs:  [x]   Clear to auscultation               []  Other:   Heart:    [x]   Regular rhythm             []  irregular rhythm    [x]   Previous H&P reviewed, patient examined and there are NO CHANGES                 []   Previous H&P reviewed, patient examined and there are changes noted.   [name], MD 6/30/201710:19 AM

## 2016-05-10 NOTE — Discharge Instructions (Signed)
1)  The drugs that you have been given will stay in your system until tomorrow so for the       next 24 hours you should not:  A. Drive an automobile  B. Make any legal decisions  C. Drink any alcoholic beverages  2)  You may resume your regular meals upon return home.  3)  A responsible adult must take you home.  Someone should stay with you for a few          hours, then be available by phone for the remainder of the treatment day.  4)  You May experience any of the following symptoms:  Headache, Nausea and a dry mouth (due to the medications you were given),  temporary memory loss and some confusion, or sore muscles (a warm bath  should help this).  If you you experience any of these symptoms let us know on                your return visit.  5)  Report any of the following: any acute discomfort, severe headache, or temperature        greater than 100.5 F.   Also report any unusual redness, swelling, drainage, or pain         at your IV site.    You may report Symptoms to:  [place] at [facility]          Phone: [phone], ECT Department           or Dr. Prescott Gum office [phone]  6)  Your next ECT Treatment is Friday, May 24, 2016.  We will call 2 days prior to your scheduled appointment for arrival times.  7)  Nothing to eat or drink after midnight the night before your procedure.  8)  Take  Your lisinopril with a sip of water the morning of your procedure.  9)  Other Instructions: Call [phone] to cancel the morning of your procedure due         to illness or emergency.  10) We will call within 72 hours to assess how you are feeling.

## 2016-05-10 NOTE — Anesthesia Procedure Notes (Signed)
Date/Time: 05/10/2016 10:27 AM Performed by: Dionne Bucy Pre-anesthesia Checklist: Patient identified, Emergency Drugs available, Suction available and Patient being monitored Patient Re-evaluated:Patient Re-evaluated prior to inductionOxygen Delivery Method: Circle system utilized Preoxygenation: Pre-oxygenation with 100% oxygen Intubation Type: IV induction Ventilation: Mask ventilation without difficulty and Mask ventilation throughout procedure Airway Equipment and Method: Bite block Placement Confirmation: positive ETCO2 Dental Injury: Teeth and Oropharynx as per pre-operative assessment

## 2016-05-24 ENCOUNTER — Encounter: Payer: Self-pay | Admitting: Anesthesiology

## 2016-05-24 ENCOUNTER — Encounter
Admission: RE | Admit: 2016-05-24 | Discharge: 2016-05-24 | Disposition: A | Discharge status: Home / Self Care | Payer: Medicare PPO | Source: Ambulatory Visit | Attending: Psychiatry | Admitting: Psychiatry

## 2016-05-24 ENCOUNTER — Other Ambulatory Visit: Payer: Self-pay | Admitting: Psychiatry

## 2016-05-24 DIAGNOSIS — K219 Gastro-esophageal reflux disease without esophagitis: Secondary | ICD-10-CM | POA: Insufficient documentation

## 2016-05-24 DIAGNOSIS — F332 Major depressive disorder, recurrent severe without psychotic features: Secondary | ICD-10-CM | POA: Insufficient documentation

## 2016-05-24 DIAGNOSIS — E119 Type 2 diabetes mellitus without complications: Secondary | ICD-10-CM | POA: Diagnosis not present

## 2016-05-24 DIAGNOSIS — E1142 Type 2 diabetes mellitus with diabetic polyneuropathy: Secondary | ICD-10-CM | POA: Diagnosis not present

## 2016-05-24 DIAGNOSIS — F329 Major depressive disorder, single episode, unspecified: Secondary | ICD-10-CM | POA: Diagnosis not present

## 2016-05-24 DIAGNOSIS — I10 Essential (primary) hypertension: Secondary | ICD-10-CM | POA: Diagnosis not present

## 2016-05-24 DIAGNOSIS — E669 Obesity, unspecified: Secondary | ICD-10-CM | POA: Insufficient documentation

## 2016-05-24 DIAGNOSIS — F609 Personality disorder, unspecified: Secondary | ICD-10-CM | POA: Insufficient documentation

## 2016-05-24 DIAGNOSIS — E7800 Pure hypercholesterolemia, unspecified: Secondary | ICD-10-CM | POA: Diagnosis not present

## 2016-05-24 LAB — POCT PREGNANCY, URINE: Preg Test, Ur: NEGATIVE

## 2016-05-24 LAB — GLUCOSE, CAPILLARY: Glucose-Capillary: 87 mg/dL (ref 65–99)

## 2016-05-24 MED ORDER — ESMOLOL HCL 100 MG/10ML IV SOLN
INTRAVENOUS | Status: DC | PRN
  Administered 2016-05-24: 10 mg via INTRAVENOUS

## 2016-05-24 MED ORDER — KETOROLAC TROMETHAMINE 30 MG/ML IJ SOLN
30.0000 mg | Freq: Once | INTRAMUSCULAR | Status: AC
  Administered 2016-05-24: 30 mg via INTRAVENOUS

## 2016-05-24 MED ORDER — KETOROLAC TROMETHAMINE 30 MG/ML IJ SOLN
INTRAMUSCULAR | Status: AC
  Administered 2016-05-24: 30 mg via INTRAVENOUS
  Filled 2016-05-24: qty 1

## 2016-05-24 MED ORDER — METHOHEXITAL SODIUM 100 MG/10ML IV SOSY
PREFILLED_SYRINGE | INTRAVENOUS | Status: DC | PRN
  Administered 2016-05-24: 70 mg via INTRAVENOUS

## 2016-05-24 MED ORDER — GLYCOPYRROLATE 0.2 MG/ML IJ SOLN
0.4000 mg | Freq: Once | INTRAMUSCULAR | Status: AC
  Administered 2016-05-24: 0.4 mg via INTRAVENOUS

## 2016-05-24 MED ORDER — SODIUM CHLORIDE 0.9 % IV SOLN
INTRAVENOUS | Status: DC | PRN
  Administered 2016-05-24 (×2): via INTRAVENOUS

## 2016-05-24 MED ORDER — GLYCOPYRROLATE 0.2 MG/ML IJ SOLN
INTRAMUSCULAR | Status: AC
  Administered 2016-05-24: 0.4 mg via INTRAVENOUS
  Filled 2016-05-24: qty 2

## 2016-05-24 MED ORDER — SODIUM CHLORIDE 0.9 % IV SOLN
250.0000 mL | Freq: Once | INTRAVENOUS | Status: AC
  Administered 2016-05-24: 500 mL via INTRAVENOUS

## 2016-05-24 MED ORDER — LABETALOL HCL 5 MG/ML IV SOLN
INTRAVENOUS | Status: DC | PRN
  Administered 2016-05-24: 20 mg via INTRAVENOUS

## 2016-05-24 NOTE — Procedures (Signed)
ECT SERVICES Physician's Interval Evaluation & Treatment Note  Patient Identification: Melinda [name] MRN:  [id number] Date of Evaluation:  05/24/2016 TX #: [id number]  MADRS:   MMSE:   P.E. Findings:  No change to physical exam. Vitals stable. Heart and lungs normal.  Psychiatric Interval Note:  A little bit anxious no active suicidal ideation.  Subjective:  Patient is a 45 y.o. female seen for evaluation for Electroconvulsive Therapy. Feeling a little nervous worried about her son. Very situational.  Treatment Summary:   []   Right Unilateral             [x]  Bilateral   % Energy : 1.0 ms, 35%   Impedance: 1940 ohms  Seizure Energy Index: 5481 V squared  Postictal Suppression Index: 85%  Seizure Concordance Index: 97%  Medications  Pre Shock: Robinul 0.4 mg, labetalol 20 mg, esmolol 10 mg, Toradol 30 mg, Brevital 70 mg, succinylcholine 100 mg  Post Shock: None  Seizure Duration: 51 seconds by EMG, 100 seconds by EEG   Comments: Follow-up in 1 week as by the usual schedule.   Lungs:  [x]   Clear to auscultation               []  Other:   Heart:    [x]   Regular rhythm             []  irregular rhythm    [x]   Previous H&P reviewed, patient examined and there are NO CHANGES                 []   Previous H&P reviewed, patient examined and there are changes noted.   [name], MD 7/14/201710:34 AM

## 2016-05-24 NOTE — H&P (Signed)
Melinda [name] is an 45 y.o. female.   Chief Complaint: Patient is feeling a little bit anxious. Worried about her son. No active suicidal ideation. Not severely depressed. Still functioning. HPI: Chronic depression maintained with weekly ECT.  Past Medical History  Diagnosis Date  . Depression   . Diabetes mellitus without complication ([place])   . GERD (gastroesophageal reflux disease)   . Hypercholesterolemia 03/07/15  . Hypertension   . Obesity 03/07/15  . Personality disorder 03/07/15  . Sinus tachycardia ([place]) 03/07/15    history of  . Suicidal thoughts 03/07/15  . Diabetic peripheral neuropathy ([place]) 03/07/15  . Diabetic peripheral neuropathy ([place]) 03/07/15  . Diabetic peripheral neuropathy ([place]) 03/07/15    Past Surgical History  Procedure Laterality Date  . Electroconvulsion therapy  03/07/15    Family History  Problem Relation Age of Onset  . Hypertension Father   . Diabetes Mother    Social History:  reports that she has never smoked. She has never used smokeless tobacco. She reports that she does not drink alcohol or use illicit drugs.  Allergies:  Allergies  Allergen Reactions  . Prednisone     Increases blood sugar     (Not in a hospital admission)  Results for orders placed or performed during the hospital encounter of 05/24/16 (from the past 48 hour(s))  Pregnancy, urine POC     Status: None   Collection Time: 05/24/16  8:31 AM  Result Value Ref Range   Preg Test, Ur NEGATIVE NEGATIVE    Comment:        THE SENSITIVITY OF THIS METHODOLOGY IS >24 mIU/mL   Glucose, capillary     Status: None   Collection Time: 05/24/16  8:38 AM  Result Value Ref Range   Glucose-Capillary 87 65 - 99 mg/dL   Comment 1 Notify RN    No results found.  Review of Systems  Constitutional: Negative.   HENT: Negative.   Eyes: Negative.   Respiratory: Negative.   Cardiovascular: Negative.   Gastrointestinal: Negative.   Musculoskeletal: Negative.   Skin: Negative.    Neurological: Negative.   Psychiatric/Behavioral: Negative for depression, suicidal ideas, hallucinations, memory loss and substance abuse. The patient is not nervous/anxious and does not have insomnia.     Blood pressure 142/78, pulse 96, temperature 98.2 F (36.8 C), temperature source Oral, resp. rate 18, height 5\' 5"  (1.651 m), weight 100.699 kg (222 lb), last menstrual period 03/27/2016, SpO2 99 %. Physical Exam  Nursing note and vitals reviewed. Constitutional: She appears well-developed and well-nourished.  HENT:  Head: Normocephalic and atraumatic.  Eyes: Conjunctivae are normal. Pupils are equal, round, and reactive to light.  Neck: Normal range of motion.  Cardiovascular: Regular rhythm and normal heart sounds.   Respiratory: Effort normal and breath sounds normal. No respiratory distress.  GI: Soft.  Musculoskeletal: Normal range of motion.  Neurological: She is alert.  Skin: Skin is warm and dry.  Psychiatric: She has a normal mood and affect. Her behavior is normal. Judgment and thought content normal.     Assessment/Plan Treatment today follow-up one week. Review of medication and overall plan. Supportive therapy.  [name], MD 05/24/2016, 10:32 AM

## 2016-05-24 NOTE — Anesthesia Postprocedure Evaluation (Signed)
Anesthesia Post Note  Patient: Melinda [name]  Procedure(s) Performed: * No procedures listed *  Patient location during evaluation: PACU Anesthesia Type: General Level of consciousness: awake and alert Pain management: pain level controlled Vital Signs Assessment: post-procedure vital signs reviewed and stable Respiratory status: spontaneous breathing, nonlabored ventilation, respiratory function stable and patient connected to nasal cannula oxygen Cardiovascular status: blood pressure returned to baseline and stable Postop Assessment: no signs of nausea or vomiting Anesthetic complications: no    Last Vitals:  Filed Vitals:   05/24/16 1119 05/24/16 1136  BP: 97/48 122/79  Pulse: 93 88  Temp: 36.9 C   Resp: 31 18    Last Pain:  Filed Vitals:   05/24/16 1138  PainSc: 0-No pain                 Kymari Nuon S

## 2016-05-24 NOTE — Anesthesia Preprocedure Evaluation (Signed)
Anesthesia Evaluation  Patient identified by MRN, date of birth, ID band Patient awake    Reviewed: Allergy & Precautions, H&P , NPO status , Patient's Chart, lab work & pertinent test results, reviewed documented beta blocker date and time   History of Anesthesia Complications Negative for: history of anesthetic complications  Airway Mallampati: II  TM Distance: >3 FB     Dental  (+) Chipped   Pulmonary sleep apnea ,           Cardiovascular hypertension, Pt. on medications and Pt. on home beta blockers      Neuro/Psych PSYCHIATRIC DISORDERS  Neuromuscular disease    GI/Hepatic GERD  Controlled,  Endo/Other  diabetes, Type 2Morbid obesity  Renal/GU      Musculoskeletal   Abdominal   Peds  Hematology   Anesthesia Other Findings Past Medical History:   Depression                                                   Diabetes mellitus without complication ([place])                 GERD (gastroesophageal reflux disease)                       Hypercholesterolemia                            03/07/15      Hypertension                                                 Obesity                                         03/07/15      Personality disorder                            03/07/15      Sinus tachycardia ([place])                         03/07/15        Comment:history of   Suicidal thoughts                               03/07/15      Diabetic peripheral neuropathy ([place])            03/07/15      Diabetic peripheral neuropathy ([place])            03/07/15      Diabetic peripheral neuropathy ([place])            03/07/15     Past Surgical History:   electroconvulsion therapy                        03/07/15     BMI    Body Mass Index   40.22 kg/m 2  Reproductive/Obstetrics                             Anesthesia Physical  Anesthesia Plan  ASA: III  Anesthesia Plan: General   Post-op Pain Management:     Induction:   Airway Management Planned: Nasal Cannula  Additional Equipment:   Intra-op Plan:   Post-operative Plan:   Informed Consent:   Plan Discussed with:   Anesthesia Plan Comments:         Anesthesia Quick Evaluation

## 2016-05-24 NOTE — Addendum Note (Signed)
Addendum  created 05/24/16 1721 by Dionne Bucy, CRNA   Modules edited: Anesthesia Blocks and Procedures, Clinical Notes   Clinical Notes:  File: [id number]

## 2016-05-24 NOTE — Transfer of Care (Signed)
Immediate Anesthesia Transfer of Care Note  Patient: Melinda [name]  Procedure(s) Performed: ECT  Patient Location: PACU  Anesthesia Type:General  Level of Consciousness: sedated  Airway & Oxygen Therapy: Patient Spontanous Breathing and Patient connected to face mask oxygen  Post-op Assessment: Report given to RN  Post vital signs: Reviewed and stable  Last Vitals:  Filed Vitals:   05/24/16 0824 05/24/16 1049  BP: 142/78 108/88  Pulse: 96 106  Temp: 36.8 C 36.7 C  Resp: 18 21    Last Pain: There were no vitals filed for this visit.       Complications: No apparent anesthesia complications

## 2016-05-24 NOTE — Discharge Instructions (Signed)
1)  The drugs that you have been given will stay in your system until tomorrow so for the       next 24 hours you should not:  A. Drive an automobile  B. Make any legal decisions  C. Drink any alcoholic beverages  2)  You may resume your regular meals upon return home.  3)  A responsible adult must take you home.  Someone should stay with you for a few          hours, then be available by phone for the remainder of the treatment day.  4)  You May experience any of the following symptoms:  Headache, Nausea and a dry mouth (due to the medications you were given),  temporary memory loss and some confusion, or sore muscles (a warm bath  should help this).  If you you experience any of these symptoms let us know on                your return visit.  5)  Report any of the following: any acute discomfort, severe headache, or temperature        greater than 100.5 F.   Also report any unusual redness, swelling, drainage, or pain         at your IV site.    You may report Symptoms to:  [place] at [facility]          Phone: [phone], ECT Department           or Dr. Prescott Gum office [phone]  6)  Your next ECT Treatment is Day Friday Date July 21  We will call 2 days prior to your scheduled appointment for arrival times.  7)  Nothing to eat or drink after midnight the night before your procedure.  8)  Take Lisinopril    With a sip of water the morning of your procedure.  9)  Other Instructions: Call [phone] to cancel the morning of your procedure due         to illness or emergency.  10) We will call within 72 hours to assess how you are feeling.

## 2016-05-24 NOTE — Anesthesia Procedure Notes (Signed)
Date/Time: 05/24/2016 10:39 AM Performed by: Dionne Bucy Pre-anesthesia Checklist: Patient identified, Emergency Drugs available, Suction available and Patient being monitored Patient Re-evaluated:Patient Re-evaluated prior to inductionOxygen Delivery Method: Circle system utilized Preoxygenation: Pre-oxygenation with 100% oxygen Intubation Type: IV induction Ventilation: Mask ventilation without difficulty and Mask ventilation throughout procedure Airway Equipment and Method: Bite block Placement Confirmation: positive ETCO2 Dental Injury: Teeth and Oropharynx as per pre-operative assessment

## 2016-05-27 NOTE — Addendum Note (Signed)
Addendum  created 05/27/16 1510 by Gunnar Bulla, MD   Modules edited: Anesthesia Responsible Staff

## 2016-05-30 ENCOUNTER — Other Ambulatory Visit: Payer: Self-pay | Admitting: Psychiatry

## 2016-05-31 ENCOUNTER — Encounter: Payer: Self-pay | Admitting: Anesthesiology

## 2016-05-31 ENCOUNTER — Encounter (HOSPITAL_BASED_OUTPATIENT_CLINIC_OR_DEPARTMENT_OTHER)
Admission: RE | Admit: 2016-05-31 | Discharge: 2016-05-31 | Disposition: A | Discharge status: Home / Self Care | Payer: Medicare PPO | Source: Ambulatory Visit | Attending: Psychiatry | Admitting: Psychiatry

## 2016-05-31 ENCOUNTER — Telehealth: Payer: Self-pay | Admitting: *Deleted

## 2016-05-31 DIAGNOSIS — E7800 Pure hypercholesterolemia, unspecified: Secondary | ICD-10-CM | POA: Diagnosis not present

## 2016-05-31 DIAGNOSIS — E119 Type 2 diabetes mellitus without complications: Secondary | ICD-10-CM | POA: Diagnosis not present

## 2016-05-31 DIAGNOSIS — F609 Personality disorder, unspecified: Secondary | ICD-10-CM | POA: Diagnosis not present

## 2016-05-31 DIAGNOSIS — K219 Gastro-esophageal reflux disease without esophagitis: Secondary | ICD-10-CM | POA: Diagnosis not present

## 2016-05-31 DIAGNOSIS — F332 Major depressive disorder, recurrent severe without psychotic features: Secondary | ICD-10-CM | POA: Diagnosis not present

## 2016-05-31 DIAGNOSIS — I10 Essential (primary) hypertension: Secondary | ICD-10-CM | POA: Diagnosis not present

## 2016-05-31 DIAGNOSIS — E669 Obesity, unspecified: Secondary | ICD-10-CM | POA: Diagnosis not present

## 2016-05-31 DIAGNOSIS — E1142 Type 2 diabetes mellitus with diabetic polyneuropathy: Secondary | ICD-10-CM | POA: Diagnosis not present

## 2016-05-31 LAB — POCT PREGNANCY, URINE: Preg Test, Ur: NEGATIVE

## 2016-05-31 LAB — GLUCOSE, CAPILLARY: Glucose-Capillary: 85 mg/dL (ref 65–99)

## 2016-05-31 MED ORDER — ONDANSETRON HCL 4 MG/2ML IJ SOLN: 4.0000 mg | Freq: Once | INTRAMUSCULAR | Status: DC | PRN

## 2016-05-31 MED ORDER — FENTANYL CITRATE (PF) 100 MCG/2ML IJ SOLN: 25.0000 ug | INTRAMUSCULAR | Status: DC | PRN

## 2016-05-31 MED ORDER — GLYCOPYRROLATE 0.2 MG/ML IJ SOLN
INTRAMUSCULAR | Status: AC
  Administered 2016-05-31: 0.4 mg via INTRAVENOUS
  Filled 2016-05-31: qty 2

## 2016-05-31 MED ORDER — ONDANSETRON HCL 4 MG/2ML IJ SOLN
INTRAMUSCULAR | Status: AC
  Filled 2016-05-31: qty 2

## 2016-05-31 MED ORDER — KETOROLAC TROMETHAMINE 30 MG/ML IJ SOLN
30.0000 mg | Freq: Once | INTRAMUSCULAR | Status: AC
  Administered 2016-05-31: 30 mg via INTRAVENOUS

## 2016-05-31 MED ORDER — LABETALOL HCL 5 MG/ML IV SOLN
INTRAVENOUS | Status: DC | PRN
  Administered 2016-05-31: 20 mg via INTRAVENOUS

## 2016-05-31 MED ORDER — SODIUM CHLORIDE 0.9 % IV SOLN
250.0000 mL | Freq: Once | INTRAVENOUS | Status: AC
  Administered 2016-05-31: 500 mL via INTRAVENOUS

## 2016-05-31 MED ORDER — SUCCINYLCHOLINE CHLORIDE 200 MG/10ML IV SOSY
PREFILLED_SYRINGE | INTRAVENOUS | Status: DC | PRN
  Administered 2016-05-31: 100 mg via INTRAVENOUS

## 2016-05-31 MED ORDER — METHOHEXITAL SODIUM 100 MG/10ML IV SOSY
PREFILLED_SYRINGE | INTRAVENOUS | Status: DC | PRN
  Administered 2016-05-31: 70 mg via INTRAVENOUS

## 2016-05-31 MED ORDER — ESMOLOL HCL 100 MG/10ML IV SOLN
INTRAVENOUS | Status: DC | PRN
  Administered 2016-05-31: 10 mg via INTRAVENOUS

## 2016-05-31 MED ORDER — GLYCOPYRROLATE 0.2 MG/ML IJ SOLN
0.4000 mg | Freq: Once | INTRAMUSCULAR | Status: AC
  Administered 2016-05-31: 0.4 mg via INTRAVENOUS

## 2016-05-31 MED ORDER — KETOROLAC TROMETHAMINE 30 MG/ML IJ SOLN
INTRAMUSCULAR | Status: AC
  Administered 2016-05-31: 30 mg via INTRAVENOUS
  Filled 2016-05-31: qty 1

## 2016-05-31 MED ORDER — SODIUM CHLORIDE 0.9 % IV SOLN
INTRAVENOUS | Status: DC | PRN
  Administered 2016-05-31: 11:00:00 via INTRAVENOUS

## 2016-05-31 NOTE — Discharge Instructions (Addendum)
°  1)  The drugs that you have been given will stay in your system until tomorrow so for the       next 24 hours you should not:  A. Drive an automobile  B. Make any legal decisions  C. Drink any alcoholic beverages  2)  You may resume your regular meals upon return home.  3)  A responsible adult must take you home.  Someone should stay with you for a few          hours, then be available by phone for the remainder of the treatment day.  4)  You May experience any of the following symptoms:  Headache, Nausea and a dry mouth (due to the medications you were given),  temporary memory loss and some confusion, or sore muscles (a warm bath  should help this).  If you you experience any of these symptoms let us know on                your return visit.  5)  Report any of the following: any acute discomfort, severe headache, or temperature        greater than 100.5 F.   Also report any unusual redness, swelling, drainage, or pain         at your IV site.    You may report Symptoms to:  [place] at [facility]          Phone: [phone], ECT Department           or Dr. Prescott Gum office [phone]  6)  Your next ECT Treatment is Friday, June 07, 2016.  We will call 2 days prior to your scheduled appointment for arrival times.  7)  Nothing to eat or drink after midnight the night before your procedure.  8)  Take lisinopril with a sip of water the morning of your procedure.  9)  Other Instructions: Call [phone] to cancel the morning of your procedure due         to illness or emergency.  10) We will call within 72 hours to assess how you are feeling.

## 2016-05-31 NOTE — Progress Notes (Signed)
Spoke with Mel Almond ( Dorita's sister)  and she informed us that she had talked with [place] on phone last night.  States Becci was talking about cutting herself after her son was sent to jail.  Informed Cora that Letty had discussed this with Dr. Weber Cooks.  Informed her that [place] stated she had thoughts but  no intent to harm herself when asked today on admission.  Mel Almond was informed to bring Jamilee to ER if she had any intent to harm or harms herself.  Cora verbalized understanding of statement above.

## 2016-05-31 NOTE — H&P (Signed)
Melinda [name] is an 45 y.o. female.   Chief Complaint: Feeling more depressed today. Son is in trouble and that is upsetting her. Thinking about cutting herself. HPI: Long-standing depression responsive to chronic ECT treatment well tolerated  Past Medical History  Diagnosis Date  . Depression   . Diabetes mellitus without complication ([place])   . GERD (gastroesophageal reflux disease)   . Hypercholesterolemia 03/07/15  . Hypertension   . Obesity 03/07/15  . Personality disorder 03/07/15  . Sinus tachycardia ([place]) 03/07/15    history of  . Suicidal thoughts 03/07/15  . Diabetic peripheral neuropathy ([place]) 03/07/15  . Diabetic peripheral neuropathy ([place]) 03/07/15  . Diabetic peripheral neuropathy ([place]) 03/07/15    Past Surgical History  Procedure Laterality Date  . Electroconvulsion therapy  03/07/15    Family History  Problem Relation Age of Onset  . Hypertension Father   . Diabetes Mother    Social History:  reports that she has never smoked. She has never used smokeless tobacco. She reports that she does not drink alcohol or use illicit drugs.  Allergies:  Allergies  Allergen Reactions  . Prednisone     Increases blood sugar     (Not in a hospital admission)  Results for orders placed or performed during the hospital encounter of 05/31/16 (from the past 48 hour(s))  Pregnancy, urine POC     Status: None   Collection Time: 05/31/16  8:03 AM  Result Value Ref Range   Preg Test, Ur NEGATIVE NEGATIVE    Comment:        THE SENSITIVITY OF THIS METHODOLOGY IS >24 mIU/mL   Glucose, capillary     Status: None   Collection Time: 05/31/16  8:06 AM  Result Value Ref Range   Glucose-Capillary 85 65 - 99 mg/dL   No results found.  Review of Systems  Constitutional: Negative.   HENT: Negative.   Eyes: Negative.   Respiratory: Negative.   Cardiovascular: Negative.   Gastrointestinal: Negative.   Musculoskeletal: Negative.   Skin: Negative.   Neurological: Negative.    Psychiatric/Behavioral: Positive for depression. Negative for suicidal ideas, hallucinations, memory loss and substance abuse. The patient is nervous/anxious. The patient does not have insomnia.     Blood pressure 141/81, pulse 100, temperature 98 F (36.7 C), temperature source Tympanic, resp. rate 16, weight 100.699 kg (222 lb), SpO2 98 %. Physical Exam  Nursing note and vitals reviewed. Constitutional: She appears well-developed and well-nourished.  HENT:  Head: Normocephalic and atraumatic.  Eyes: Conjunctivae are normal. Pupils are equal, round, and reactive to light.  Neck: Normal range of motion.  Cardiovascular: Regular rhythm and normal heart sounds.   Respiratory: Effort normal. No respiratory distress.  GI: Soft.  Musculoskeletal: Normal range of motion.  Neurological: She is alert.  Skin: Skin is warm and dry.  Psychiatric: Judgment normal. Her speech is delayed. She is slowed. Cognition and memory are normal. She exhibits a depressed mood. She expresses no suicidal ideation.     Assessment/Plan Treatment today follow-up in 1 week no change to medicine.  [name], MD 05/31/2016, 10:27 AM

## 2016-05-31 NOTE — Anesthesia Procedure Notes (Signed)
Date/Time: 05/31/2016 10:39 AM Performed by: Dionne Bucy Pre-anesthesia Checklist: Patient identified, Emergency Drugs available, Suction available and Patient being monitored Patient Re-evaluated:Patient Re-evaluated prior to inductionOxygen Delivery Method: Circle system utilized Preoxygenation: Pre-oxygenation with 100% oxygen Intubation Type: IV induction Ventilation: Mask ventilation without difficulty and Mask ventilation throughout procedure Airway Equipment and Method: Bite block Placement Confirmation: positive ETCO2 Dental Injury: Teeth and Oropharynx as per pre-operative assessment

## 2016-05-31 NOTE — Anesthesia Preprocedure Evaluation (Signed)
Anesthesia Evaluation  Patient identified by MRN, date of birth, ID band Patient awake    Reviewed: Allergy & Precautions, NPO status , Patient's Chart, lab work & pertinent test results, reviewed documented beta blocker date and time   Airway Mallampati: III  TM Distance: >3 FB     Dental  (+) Chipped   Pulmonary sleep apnea ,           Cardiovascular hypertension, Pt. on medications      Neuro/Psych PSYCHIATRIC DISORDERS Depression Bipolar Disorder  Neuromuscular disease    GI/Hepatic GERD  Controlled,  Endo/Other  diabetes, Type 2  Renal/GU      Musculoskeletal   Abdominal   Peds  Hematology   Anesthesia Other Findings Obese.  Reproductive/Obstetrics                             Anesthesia Physical Anesthesia Plan  ASA: III  Anesthesia Plan: General   Post-op Pain Management:    Induction: Intravenous  Airway Management Planned: Mask  Additional Equipment:   Intra-op Plan:   Post-operative Plan:   Informed Consent: I have reviewed the patients History and Physical, chart, labs and discussed the procedure including the risks, benefits and alternatives for the proposed anesthesia with the patient or authorized representative who has indicated his/her understanding and acceptance.     Plan Discussed with: CRNA  Anesthesia Plan Comments:         Anesthesia Quick Evaluation  

## 2016-05-31 NOTE — Procedures (Signed)
ECT SERVICES Physician's Interval Evaluation & Treatment Note  Patient Identification: SHONTRICE AROCHA MRN:  [id number] Date of Evaluation:  05/31/2016 TX #: [id number]  MADRS:   MMSE:   P.E. Findings:  No change to physical exam. Vital signs normal. Lungs clear heart regular.  Psychiatric Interval Note:  She is feeling a little more depressed this week. Situational. Problems with her son. No active suicidal ideation.  Subjective:  Patient is a 45 y.o. female seen for evaluation for Electroconvulsive Therapy. A little bit more down.  Treatment Summary:   []   Right Unilateral             [x]  Bilateral   % Energy : 1.0 ms 35%   Impedance: 1440 ohms  Seizure Energy Index: 5369 V squared  Postictal Suppression Index: 94%  Seizure Concordance Index: 97%  Medications  Pre Shock: Robinul 0.4 mg Toradol 30 mg labetalol 20 mg esmolol 10 mg Brevital 70 mg succinylcholine 100 mg  Post Shock: None  Seizure Duration: 35 seconds by EMG, 84 seconds by EEG   Comments: Follow-up next week regular schedule   Lungs:  [x]   Clear to auscultation               []  Other:   Heart:    [x]   Regular rhythm             []  irregular rhythm    [x]   Previous H&P reviewed, patient examined and there are NO CHANGES                 []   Previous H&P reviewed, patient examined and there are changes noted.   [name], MD 7/21/201710:29 AM

## 2016-05-31 NOTE — Anesthesia Postprocedure Evaluation (Signed)
Anesthesia Post Note  Patient: Melinda [name]  Procedure(s) Performed: * No procedures listed *  Patient location during evaluation: PACU Anesthesia Type: General Level of consciousness: awake and alert Pain management: pain level controlled Vital Signs Assessment: post-procedure vital signs reviewed and stable Respiratory status: spontaneous breathing, nonlabored ventilation, respiratory function stable and patient connected to nasal cannula oxygen Cardiovascular status: blood pressure returned to baseline and stable Postop Assessment: no signs of nausea or vomiting Anesthetic complications: no    Last Vitals:  Filed Vitals:   05/31/16 1120 05/31/16 1129  BP: 117/63   Pulse: 90 90  Temp: 36.9 C 36.9 C  Resp: 24 18    Last Pain:  Filed Vitals:   05/31/16 1130  PainSc: 0-No pain                 Inis Borneman S

## 2016-05-31 NOTE — Transfer of Care (Signed)
Immediate Anesthesia Transfer of Care Note  Patient: Melinda [name]  Procedure(s) Performed: ECT  Patient Location: PACU  Anesthesia Type:General  Level of Consciousness: awake and confused  Airway & Oxygen Therapy: Patient Spontanous Breathing and Patient connected to face mask oxygen  Post-op Assessment: Report given to RN  Post vital signs: Reviewed and stable  Last Vitals:  Filed Vitals:   05/31/16 0811 05/31/16 1050  BP: 141/81 134/86  Pulse: 100 94  Temp: 36.7 C 36.9 C  Resp: 16 16    Last Pain:  Filed Vitals:   05/31/16 1052  PainSc: 0-No pain         Complications: No apparent anesthesia complications

## 2016-06-03 ENCOUNTER — Telehealth: Payer: Self-pay | Admitting: *Deleted

## 2016-06-06 ENCOUNTER — Other Ambulatory Visit: Payer: Self-pay | Admitting: Psychiatry

## 2016-06-07 ENCOUNTER — Encounter
Admission: RE | Admit: 2016-06-07 | Discharge: 2016-06-07 | Disposition: A | Discharge status: Home / Self Care | Payer: Medicare PPO | Source: Ambulatory Visit | Attending: Psychiatry | Admitting: Psychiatry

## 2016-06-07 ENCOUNTER — Encounter: Payer: Self-pay | Admitting: Anesthesiology

## 2016-06-07 DIAGNOSIS — E1142 Type 2 diabetes mellitus with diabetic polyneuropathy: Secondary | ICD-10-CM | POA: Insufficient documentation

## 2016-06-07 DIAGNOSIS — E119 Type 2 diabetes mellitus without complications: Secondary | ICD-10-CM | POA: Diagnosis not present

## 2016-06-07 DIAGNOSIS — I10 Essential (primary) hypertension: Secondary | ICD-10-CM | POA: Insufficient documentation

## 2016-06-07 DIAGNOSIS — E7800 Pure hypercholesterolemia, unspecified: Secondary | ICD-10-CM | POA: Diagnosis not present

## 2016-06-07 DIAGNOSIS — F329 Major depressive disorder, single episode, unspecified: Secondary | ICD-10-CM | POA: Diagnosis not present

## 2016-06-07 DIAGNOSIS — E669 Obesity, unspecified: Secondary | ICD-10-CM | POA: Diagnosis not present

## 2016-06-07 DIAGNOSIS — K219 Gastro-esophageal reflux disease without esophagitis: Secondary | ICD-10-CM | POA: Insufficient documentation

## 2016-06-07 DIAGNOSIS — F332 Major depressive disorder, recurrent severe without psychotic features: Secondary | ICD-10-CM

## 2016-06-07 LAB — GLUCOSE, CAPILLARY: Glucose-Capillary: 85 mg/dL (ref 65–99)

## 2016-06-07 LAB — POCT PREGNANCY, URINE: Preg Test, Ur: NEGATIVE

## 2016-06-07 MED ORDER — METHOHEXITAL SODIUM 100 MG/10ML IV SOSY
PREFILLED_SYRINGE | INTRAVENOUS | Status: DC | PRN
  Administered 2016-06-07: 70 mg via INTRAVENOUS

## 2016-06-07 MED ORDER — ESMOLOL HCL 100 MG/10ML IV SOLN
INTRAVENOUS | Status: DC | PRN
  Administered 2016-06-07: 10 mg via INTRAVENOUS

## 2016-06-07 MED ORDER — LABETALOL HCL 5 MG/ML IV SOLN
INTRAVENOUS | Status: DC | PRN
  Administered 2016-06-07: 20 mg via INTRAVENOUS

## 2016-06-07 MED ORDER — SODIUM CHLORIDE 0.9 % IV SOLN
INTRAVENOUS | Status: DC | PRN
  Administered 2016-06-07: 10:00:00 via INTRAVENOUS

## 2016-06-07 MED ORDER — KETOROLAC TROMETHAMINE 30 MG/ML IJ SOLN
INTRAMUSCULAR | Status: AC
  Administered 2016-06-07: 30 mg via INTRAVENOUS
  Filled 2016-06-07: qty 1

## 2016-06-07 MED ORDER — SODIUM CHLORIDE 0.9 % IV SOLN
250.0000 mL | Freq: Once | INTRAVENOUS | Status: AC
  Administered 2016-06-07: 500 mL via INTRAVENOUS

## 2016-06-07 MED ORDER — GLYCOPYRROLATE 0.2 MG/ML IJ SOLN
0.4000 mg | Freq: Once | INTRAMUSCULAR | Status: AC
  Administered 2016-06-07: 0.4 mg via INTRAVENOUS
  Administered 2016-06-07: 0.2 mg via INTRAVENOUS

## 2016-06-07 MED ORDER — SUCCINYLCHOLINE CHLORIDE 200 MG/10ML IV SOSY
PREFILLED_SYRINGE | INTRAVENOUS | Status: DC | PRN
  Administered 2016-06-07: 100 mg via INTRAVENOUS

## 2016-06-07 MED ORDER — GLYCOPYRROLATE 0.2 MG/ML IJ SOLN
INTRAMUSCULAR | Status: AC
  Administered 2016-06-07: 0.4 mg via INTRAVENOUS
  Filled 2016-06-07: qty 2

## 2016-06-07 MED ORDER — KETOROLAC TROMETHAMINE 30 MG/ML IJ SOLN
30.0000 mg | Freq: Once | INTRAMUSCULAR | Status: AC
  Administered 2016-06-07: 30 mg via INTRAVENOUS

## 2016-06-07 NOTE — H&P (Signed)
Melinda [name] is an 45 y.o. female.   Chief Complaint: Patient is feeling better this week. Son is back home. Doesn't report any suicidal thoughts. Baseline mood. HPI: Chronic depression stable with maintenance ECT  Past Medical History:  Diagnosis Date  . Depression   . Diabetes mellitus without complication ([place])   . Diabetic peripheral neuropathy ([place]) 03/07/15  . Diabetic peripheral neuropathy ([place]) 03/07/15  . Diabetic peripheral neuropathy ([place]) 03/07/15  . GERD (gastroesophageal reflux disease)   . Hypercholesterolemia 03/07/15  . Hypertension   . Obesity 03/07/15  . Personality disorder 03/07/15  . Sinus tachycardia ([place]) 03/07/15   history of  . Suicidal thoughts 03/07/15    Past Surgical History:  Procedure Laterality Date  . electroconvulsion therapy  03/07/15    Family History  Problem Relation Age of Onset  . Hypertension Father   . Diabetes Mother    Social History:  reports that she has never smoked. She has never used smokeless tobacco. She reports that she does not drink alcohol or use drugs.  Allergies:  Allergies  Allergen Reactions  . Prednisone     Increases blood sugar     (Not in a hospital admission)  Results for orders placed or performed during the hospital encounter of 06/07/16 (from the past 48 hour(s))  Pregnancy, urine POC     Status: None   Collection Time: 06/07/16  8:17 AM  Result Value Ref Range   Preg Test, Ur NEGATIVE NEGATIVE    Comment:        THE SENSITIVITY OF THIS METHODOLOGY IS >24 mIU/mL   Glucose, capillary     Status: None   Collection Time: 06/07/16  8:22 AM  Result Value Ref Range   Glucose-Capillary 85 65 - 99 mg/dL   Comment 1 Notify RN    No results found.  Review of Systems  Constitutional: Negative.   HENT: Negative.   Eyes: Negative.   Respiratory: Negative.   Cardiovascular: Negative.   Gastrointestinal: Negative.   Musculoskeletal: Negative.   Skin: Negative.   Neurological: Negative.    Psychiatric/Behavioral: Negative for depression, hallucinations, memory loss, substance abuse and suicidal ideas. The patient is not nervous/anxious and does not have insomnia.     Blood pressure 140/78, pulse 81, temperature 98.3 F (36.8 C), temperature source Oral, resp. rate 18, height 5\' 5"  (1.651 m), weight 99.3 kg (219 lb), SpO2 100 %. Physical Exam  Constitutional: She appears well-developed and well-nourished.  HENT:  Head: Normocephalic and atraumatic.  Eyes: Conjunctivae are normal. Pupils are equal, round, and reactive to light.  Neck: Normal range of motion.  Cardiovascular: Regular rhythm and normal heart sounds.   Respiratory: Effort normal and breath sounds normal. No respiratory distress.  GI: Soft.  Musculoskeletal: Normal range of motion.  Neurological: She is alert.  Skin: Skin is warm and dry.  Psychiatric: She has a normal mood and affect. Her behavior is normal. Judgment and thought content normal.     Assessment/Plan Treatment today follow-up next week as regularly scheduled.  [name], MD 06/07/2016, 10:03 AM

## 2016-06-07 NOTE — Procedures (Signed)
ECT SERVICES Physician's Interval Evaluation & Treatment Note  Patient Identification: Melinda [name] MRN:  [id number] Date of Evaluation:  06/07/2016 TX #: [id number]  MADRS: 14  MMSE: 29  P.E. Findings:  No change to physical exam  Psychiatric Interval Note:  Mood and affect stable no sign of thought disorder or dangerousness  Subjective:  Patient is a 45 y.o. female seen for evaluation for Electroconvulsive Therapy. No specific complaints. Feeling better.  Treatment Summary:   []   Right Unilateral             [x]  Bilateral   % Energy : 1.0 ms, 35%   Impedance: 1600 ohms  Seizure Energy Index: 2819 V squared  Postictal Suppression Index: 95%  Seizure Concordance Index: 98%  Medications  Pre Shock: Robinul 0.4 mg, Toradol 30 mg, labetalol 20 mg, esmolol 10 mg, Brevital 70 mg, succinylcholine 100 mg  Post Shock:    Seizure Duration: 33 seconds by EMG, 46 seconds by EEG   Comments: Patient is stable doing well mood good follow-up one week   Lungs:  [x]   Clear to auscultation               []  Other:   Heart:    [x]   Regular rhythm             []  irregular rhythm    [x]   Previous H&P reviewed, patient examined and there are NO CHANGES                 []   Previous H&P reviewed, patient examined and there are changes noted.   [name], MD 7/28/201710:05 AM

## 2016-06-07 NOTE — Anesthesia Procedure Notes (Signed)
Date/Time: 06/07/2016 10:12 AM Performed by: Dionne Bucy Pre-anesthesia Checklist: Patient identified, Emergency Drugs available, Suction available and Patient being monitored Patient Re-evaluated:Patient Re-evaluated prior to inductionOxygen Delivery Method: Circle system utilized Preoxygenation: Pre-oxygenation with 100% oxygen Intubation Type: IV induction Ventilation: Mask ventilation without difficulty and Mask ventilation throughout procedure Airway Equipment and Method: Bite block Placement Confirmation: positive ETCO2 Dental Injury: Teeth and Oropharynx as per pre-operative assessment

## 2016-06-07 NOTE — Anesthesia Postprocedure Evaluation (Signed)
Anesthesia Post Note  Patient: Melinda [name]  Procedure(s) Performed: * No procedures listed *  Patient location during evaluation: PACU Anesthesia Type: General Level of consciousness: awake and alert Pain management: pain level controlled Vital Signs Assessment: post-procedure vital signs reviewed and stable Respiratory status: spontaneous breathing, nonlabored ventilation, respiratory function stable and patient connected to nasal cannula oxygen Cardiovascular status: blood pressure returned to baseline and stable Postop Assessment: no signs of nausea or vomiting Anesthetic complications: no    Last Vitals:  Vitals:   06/07/16 1052 06/07/16 1114  BP: 131/76 118/71  Pulse: 87 79  Resp: 20 18  Temp: 36.7 C     Last Pain:  Vitals:   06/07/16 0819  TempSrc: Oral                 Molli Barrows

## 2016-06-07 NOTE — Anesthesia Preprocedure Evaluation (Signed)
Anesthesia Evaluation  Patient identified by MRN, date of birth, ID band Patient awake    Reviewed: Allergy & Precautions, H&P , NPO status , Patient's Chart, lab work & pertinent test results, reviewed documented beta blocker date and time   Airway Mallampati: II   Neck ROM: full    Dental  (+) Poor Dentition   Pulmonary neg pulmonary ROS, sleep apnea ,    Pulmonary exam normal        Cardiovascular hypertension, negative cardio ROS Normal cardiovascular exam Rate:Normal     Neuro/Psych negative neurological ROS  negative psych ROS   GI/Hepatic negative GI ROS, Neg liver ROS, GERD  Medicated,  Endo/Other  negative endocrine ROSdiabetes  Renal/GU negative Renal ROS  negative genitourinary   Musculoskeletal   Abdominal   Peds  Hematology negative hematology ROS (+)   Anesthesia Other Findings Past Medical History: No date: Depression No date: Diabetes mellitus without complication ([place]) 99991111: Diabetic peripheral neuropathy ([place]) 03/07/15: Diabetic peripheral neuropathy ([place]) 03/07/15: Diabetic peripheral neuropathy ([facility]) No date: GERD (gastroesophageal reflux disease) 03/07/15: Hypercholesterolemia No date: Hypertension 03/07/15: Obesity 03/07/15: Personality disorder 03/07/15: Sinus tachycardia ([place])     Comment: history of 03/07/15: Suicidal thoughts Past Surgical History: 03/07/15: electroconvulsion therapy BMI    Body Mass Index:  36.44 kg/m     Reproductive/Obstetrics                             Anesthesia Physical Anesthesia Plan  ASA: III  Anesthesia Plan: General   Post-op Pain Management:    Induction:   Airway Management Planned:   Additional Equipment:   Intra-op Plan:   Post-operative Plan:   Informed Consent: I have reviewed the patients History and Physical, chart, labs and discussed the procedure including the risks, benefits and alternatives for the  proposed anesthesia with the patient or authorized representative who has indicated his/her understanding and acceptance.   Dental Advisory Given  Plan Discussed with: CRNA  Anesthesia Plan Comments:         Anesthesia Quick Evaluation

## 2016-06-07 NOTE — Discharge Instructions (Signed)
1)  The drugs that you have been given will stay in your system until tomorrow so for the       next 24 hours you should not:  A. Drive an automobile  B. Make any legal decisions  C. Drink any alcoholic beverages  2)  You may resume your regular meals upon return home.  3)  A responsible adult must take you home.  Someone should stay with you for a few          hours, then be available by phone for the remainder of the treatment day.  4)  You May experience any of the following symptoms:  Headache, Nausea and a dry mouth (due to the medications you were given),  temporary memory loss and some confusion, or sore muscles (a warm bath  should help this).  If you you experience any of these symptoms let us know on                your return visit.  5)  Report any of the following: any acute discomfort, severe headache, or temperature        greater than 100.5 F.   Also report any unusual redness, swelling, drainage, or pain         at your IV site.    You may report Symptoms to:  [place] at [facility]          Phone: [phone], ECT Department           or Dr. Prescott Gum office [phone]  6)  Your next ECT Treatment is Day Friday  Date June 14, 2016  We will call 2 days prior to your scheduled appointment for arrival times.  7)  Nothing to eat or drink after midnight the night before your procedure.  8)  Take Lisinopril     With a sip of water the morning of your procedure.  9)  Other Instructions: Call [phone] to cancel the morning of your procedure due         to illness or emergency.  10) We will call within 72 hours to assess how you are feeling.

## 2016-06-07 NOTE — Transfer of Care (Signed)
Immediate Anesthesia Transfer of Care Note  Patient: Melinda [name]  Procedure(s) Performed: ECT  Patient Location: PACU  Anesthesia Type:General  Level of Consciousness: awake and patient cooperative  Airway & Oxygen Therapy: Patient Spontanous Breathing and Patient connected to face mask oxygen  Post-op Assessment: Report given to RN  Post vital signs: Reviewed and stable  Last Vitals:  Vitals:   06/07/16 0819  BP: 140/78  Pulse: 81  Resp: 18  Temp: 36.8 C    Last Pain:  Vitals:   06/07/16 0819  TempSrc: Oral         Complications: No apparent anesthesia complications

## 2016-06-13 ENCOUNTER — Other Ambulatory Visit: Payer: Self-pay | Admitting: Psychiatry

## 2016-06-14 ENCOUNTER — Encounter: Payer: Self-pay | Admitting: Anesthesiology

## 2016-06-14 ENCOUNTER — Encounter
Admission: RE | Admit: 2016-06-14 | Discharge: 2016-06-14 | Disposition: A | Discharge status: Home / Self Care | Payer: Medicare PPO | Source: Ambulatory Visit | Attending: Psychiatry | Admitting: Psychiatry

## 2016-06-14 DIAGNOSIS — I10 Essential (primary) hypertension: Secondary | ICD-10-CM | POA: Insufficient documentation

## 2016-06-14 DIAGNOSIS — F339 Major depressive disorder, recurrent, unspecified: Secondary | ICD-10-CM | POA: Diagnosis not present

## 2016-06-14 DIAGNOSIS — E7800 Pure hypercholesterolemia, unspecified: Secondary | ICD-10-CM | POA: Insufficient documentation

## 2016-06-14 DIAGNOSIS — K219 Gastro-esophageal reflux disease without esophagitis: Secondary | ICD-10-CM | POA: Diagnosis not present

## 2016-06-14 DIAGNOSIS — E669 Obesity, unspecified: Secondary | ICD-10-CM | POA: Diagnosis not present

## 2016-06-14 DIAGNOSIS — F332 Major depressive disorder, recurrent severe without psychotic features: Secondary | ICD-10-CM

## 2016-06-14 DIAGNOSIS — E119 Type 2 diabetes mellitus without complications: Secondary | ICD-10-CM | POA: Diagnosis not present

## 2016-06-14 DIAGNOSIS — E1142 Type 2 diabetes mellitus with diabetic polyneuropathy: Secondary | ICD-10-CM | POA: Insufficient documentation

## 2016-06-14 DIAGNOSIS — F329 Major depressive disorder, single episode, unspecified: Secondary | ICD-10-CM | POA: Diagnosis not present

## 2016-06-14 DIAGNOSIS — F319 Bipolar disorder, unspecified: Secondary | ICD-10-CM | POA: Diagnosis not present

## 2016-06-14 LAB — GLUCOSE, CAPILLARY: Glucose-Capillary: 93 mg/dL (ref 65–99)

## 2016-06-14 LAB — POCT PREGNANCY, URINE: Preg Test, Ur: NEGATIVE

## 2016-06-14 MED ORDER — SODIUM CHLORIDE 0.9 % IV SOLN
INTRAVENOUS | Status: DC | PRN
  Administered 2016-06-14: 10:00:00 via INTRAVENOUS

## 2016-06-14 MED ORDER — SODIUM CHLORIDE 0.9 % IV SOLN
250.0000 mL | Freq: Once | INTRAVENOUS | Status: AC
  Administered 2016-06-14: 09:00:00 via INTRAVENOUS

## 2016-06-14 MED ORDER — LABETALOL HCL 5 MG/ML IV SOLN
INTRAVENOUS | Status: DC | PRN
  Administered 2016-06-14: 20 mg via INTRAVENOUS

## 2016-06-14 MED ORDER — GLYCOPYRROLATE 0.2 MG/ML IJ SOLN
0.4000 mg | Freq: Once | INTRAMUSCULAR | Status: AC
  Administered 2016-06-14: 0.4 mg via INTRAVENOUS

## 2016-06-14 MED ORDER — SUCCINYLCHOLINE CHLORIDE 200 MG/10ML IV SOSY
PREFILLED_SYRINGE | INTRAVENOUS | Status: DC | PRN
  Administered 2016-06-14: 100 mg via INTRAVENOUS

## 2016-06-14 MED ORDER — METHOHEXITAL SODIUM 100 MG/10ML IV SOSY
PREFILLED_SYRINGE | INTRAVENOUS | Status: DC | PRN
  Administered 2016-06-14: 70 mg via INTRAVENOUS

## 2016-06-14 MED ORDER — GLYCOPYRROLATE 0.2 MG/ML IJ SOLN
INTRAMUSCULAR | Status: AC
Start: 2016-06-14 — End: 2016-06-14
  Administered 2016-06-14: 0.4 mg via INTRAVENOUS
  Filled 2016-06-14: qty 2

## 2016-06-14 MED ORDER — ESMOLOL HCL 100 MG/10ML IV SOLN
INTRAVENOUS | Status: DC | PRN
  Administered 2016-06-14: 10 mg via INTRAVENOUS

## 2016-06-14 MED ORDER — KETOROLAC TROMETHAMINE 30 MG/ML IJ SOLN
30.0000 mg | Freq: Once | INTRAMUSCULAR | Status: AC
  Administered 2016-06-14: 30 mg via INTRAVENOUS

## 2016-06-14 MED ORDER — KETOROLAC TROMETHAMINE 30 MG/ML IJ SOLN
INTRAMUSCULAR | Status: AC
  Administered 2016-06-14: 30 mg via INTRAVENOUS
  Filled 2016-06-14: qty 1

## 2016-06-14 NOTE — Procedures (Signed)
ECT SERVICES Physician's Interval Evaluation & Treatment Note  Patient Identification: Melinda [name] MRN:  [id number] Date of Evaluation:  06/14/2016 TX #: [id number]  MADRS:   MMSE:   P.E. Findings:  No change to physical findings  Psychiatric Interval Note:  Mood is a little bit more down this week but not severe  Subjective:  Patient is a 45 y.o. female seen for evaluation for Electroconvulsive Therapy. Worried about her son  Treatment Summary:   []   Right Unilateral             [x]  Bilateral   % Energy : 1.0 ms, 35%   Impedance: 1360 ohms  Seizure Energy Index: 2970 V squared  Postictal Suppression Index: 92%  Seizure Concordance Index: 97%  Medications  Pre Shock: Robinul 0.4 mg, labetalol 20 mg, Toradol 30 mg, esmolol 10 mg, Brevital 70 mg, succinylcholine 100 mg  Post Shock:    Seizure Duration: 46 seconds by EMG, 76 seconds by EEG   Comments: Follow-up one week as per the usual schedule.   Lungs:  [x]   Clear to auscultation               []  Other:   Heart:    [x]   Regular rhythm             []  irregular rhythm    [x]   Previous H&P reviewed, patient examined and there are NO CHANGES                 []   Previous H&P reviewed, patient examined and there are changes noted.   [name], MD 8/4/201710:20 AM

## 2016-06-14 NOTE — Anesthesia Preprocedure Evaluation (Signed)
Anesthesia Evaluation  Patient identified by MRN, date of birth, ID band Patient awake    Reviewed: Allergy & Precautions, H&P , NPO status , Patient's Chart, lab work & pertinent test results, reviewed documented beta blocker date and time   Airway Mallampati: II   Neck ROM: full    Dental  (+) Poor Dentition   Pulmonary neg pulmonary ROS, sleep apnea ,    Pulmonary exam normal        Cardiovascular hypertension, negative cardio ROS Normal cardiovascular exam Rate:Normal     Neuro/Psych negative neurological ROS  negative psych ROS   GI/Hepatic negative GI ROS, Neg liver ROS, GERD  Medicated,  Endo/Other  negative endocrine ROSdiabetes  Renal/GU negative Renal ROS  negative genitourinary   Musculoskeletal   Abdominal   Peds  Hematology negative hematology ROS (+)   Anesthesia Other Findings Past Medical History: No date: Depression No date: Diabetes mellitus without complication ([place]) 99991111: Diabetic peripheral neuropathy ([place]) 03/07/15: Diabetic peripheral neuropathy ([place]) 03/07/15: Diabetic peripheral neuropathy ([facility]) No date: GERD (gastroesophageal reflux disease) 03/07/15: Hypercholesterolemia No date: Hypertension 03/07/15: Obesity 03/07/15: Personality disorder 03/07/15: Sinus tachycardia ([place])     Comment: history of 03/07/15: Suicidal thoughts Past Surgical History: 03/07/15: electroconvulsion therapy BMI    Body Mass Index:  36.44 kg/m     Reproductive/Obstetrics                             Anesthesia Physical  Anesthesia Plan  ASA: III  Anesthesia Plan: General   Post-op Pain Management:    Induction:   Airway Management Planned:   Additional Equipment:   Intra-op Plan:   Post-operative Plan:   Informed Consent: I have reviewed the patients History and Physical, chart, labs and discussed the procedure including the risks, benefits and alternatives for the  proposed anesthesia with the patient or authorized representative who has indicated his/her understanding and acceptance.   Dental Advisory Given  Plan Discussed with: CRNA  Anesthesia Plan Comments:         Anesthesia Quick Evaluation

## 2016-06-14 NOTE — H&P (Signed)
Melinda [name] is an 45 y.o. female.   Chief Complaint: Patient is feeling a little bit more down this week. Situational. HPI: Chronic recurrent severe depression with past history of suicidality stable on maintenance ECT  Past Medical History:  Diagnosis Date  . Depression   . Diabetes mellitus without complication ([place])   . Diabetic peripheral neuropathy ([place]) 03/07/15  . Diabetic peripheral neuropathy ([place]) 03/07/15  . Diabetic peripheral neuropathy ([place]) 03/07/15  . GERD (gastroesophageal reflux disease)   . Hypercholesterolemia 03/07/15  . Hypertension   . Obesity 03/07/15  . Personality disorder 03/07/15  . Sinus tachycardia ([place]) 03/07/15   history of  . Suicidal thoughts 03/07/15    Past Surgical History:  Procedure Laterality Date  . electroconvulsion therapy  03/07/15    Family History  Problem Relation Age of Onset  . Hypertension Father   . Diabetes Mother    Social History:  reports that she has never smoked. She has never used smokeless tobacco. She reports that she does not drink alcohol or use drugs.  Allergies:  Allergies  Allergen Reactions  . Prednisone     Increases blood sugar     (Not in a hospital admission)  Results for orders placed or performed during the hospital encounter of 06/14/16 (from the past 48 hour(s))  Pregnancy, urine POC     Status: None   Collection Time: 06/14/16  8:11 AM  Result Value Ref Range   Preg Test, Ur NEGATIVE NEGATIVE    Comment:        THE SENSITIVITY OF THIS METHODOLOGY IS >24 mIU/mL   Glucose, capillary     Status: None   Collection Time: 06/14/16  8:17 AM  Result Value Ref Range   Glucose-Capillary 93 65 - 99 mg/dL   No results found.  Review of Systems  Constitutional: Negative.   HENT: Negative.   Eyes: Negative.   Respiratory: Negative.   Cardiovascular: Negative.   Gastrointestinal: Negative.   Musculoskeletal: Negative.   Skin: Negative.   Neurological: Negative.   Psychiatric/Behavioral:  Positive for depression. Negative for hallucinations, memory loss, substance abuse and suicidal ideas. The patient is nervous/anxious. The patient does not have insomnia.     Blood pressure (!) 142/66, pulse 77, temperature 97.7 F (36.5 C), temperature source Oral, resp. rate 16, height 5\' 5"  (1.651 m), weight 100.2 kg (221 lb), SpO2 100 %. Physical Exam  Constitutional: She appears well-developed and well-nourished.  HENT:  Head: Normocephalic and atraumatic.  Eyes: Conjunctivae are normal. Pupils are equal, round, and reactive to light.  Neck: Normal range of motion.  Cardiovascular: Normal heart sounds.   Respiratory: Effort normal.  GI: Soft.  Musculoskeletal: Normal range of motion.  Neurological: She is alert.  Skin: Skin is warm and dry.  Psychiatric: Judgment normal. Her affect is blunt. Her speech is delayed. She is slowed. Cognition and memory are normal. She expresses no suicidal ideation.     Assessment/Plan Treatment today supportive counseling. Follow-up one week  [name], MD 06/14/2016, 10:18 AM

## 2016-06-14 NOTE — Transfer of Care (Signed)
Immediate Anesthesia Transfer of Care Note  Patient: Melinda [name]  Procedure(s) Performed: ECT  Patient Location: PACU  Anesthesia Type:General  Level of Consciousness: sedated  Airway & Oxygen Therapy: Patient Spontanous Breathing and Patient connected to face mask oxygen  Post-op Assessment: Report given to RN and Post -op Vital signs reviewed and stable  Post vital signs: Reviewed and stable  Last Vitals:  Vitals:   06/14/16 0808 06/14/16 1036  BP: (!) 142/66 140/86  Pulse: 77 80  Resp: 16 (P) 18  Temp: 36.5 C (P) 36.6 C    Last Pain:  Vitals:   06/14/16 0808  TempSrc: Oral         Complications: No apparent anesthesia complications

## 2016-06-14 NOTE — Anesthesia Procedure Notes (Addendum)
Date/Time: 06/14/2016 10:24 AM Performed by: Dionne Bucy Pre-anesthesia Checklist: Patient identified, Emergency Drugs available, Suction available and Patient being monitored Patient Re-evaluated:Patient Re-evaluated prior to inductionOxygen Delivery Method: Circle system utilized Preoxygenation: Pre-oxygenation with 100% oxygen Intubation Type: IV induction Ventilation: Mask ventilation without difficulty and Mask ventilation throughout procedure Airway Equipment and Method: Bite block Placement Confirmation: positive ETCO2 Dental Injury: Teeth and Oropharynx as per pre-operative assessment

## 2016-06-14 NOTE — Discharge Instructions (Signed)
1)  The drugs that you have been given will stay in your system until tomorrow so for the       next 24 hours you should not:  A. Drive an automobile  B. Make any legal decisions  C. Drink any alcoholic beverages  2)  You may resume your regular meals upon return home.  3)  A responsible adult must take you home.  Someone should stay with you for a few          hours, then be available by phone for the remainder of the treatment day.  4)  You May experience any of the following symptoms:  Headache, Nausea and a dry mouth (due to the medications you were given),  temporary memory loss and some confusion, or sore muscles (a warm bath  should help this).  If you you experience any of these symptoms let us know on                your return visit.  5)  Report any of the following: any acute discomfort, severe headache, or temperature        greater than 100.5 F.   Also report any unusual redness, swelling, drainage, or pain         at your IV site.    You may report Symptoms to:  [place] at [facility]          Phone: [phone], ECT Department           or Dr. Prescott Gum office [phone]  6)  Your next ECT Treatment is Friday, Aug. 11.  We will call 2 days prior to your scheduled appointment for arrival times.  7)  Nothing to eat or drink after midnight the night before your procedure.  8)  Take lisinopril with a sip of water the morning of your procedure.  9)  Other Instructions: Call [phone] to cancel the morning of your procedure due         to illness or emergency.  10) We will call within 72 hours to assess how you are feeling.

## 2016-06-14 NOTE — Anesthesia Postprocedure Evaluation (Signed)
Anesthesia Post Note  Patient: Melinda [name]  Procedure(s) Performed: * No procedures listed *  Patient location during evaluation: PACU Anesthesia Type: General Level of consciousness: awake and alert Pain management: pain level controlled Vital Signs Assessment: post-procedure vital signs reviewed and stable Respiratory status: spontaneous breathing, nonlabored ventilation, respiratory function stable and patient connected to nasal cannula oxygen Cardiovascular status: blood pressure returned to baseline and stable Postop Assessment: no signs of nausea or vomiting Anesthetic complications: no    Last Vitals:  Vitals:   06/14/16 1106 06/14/16 1116  BP: (!) 143/75 133/76  Pulse: 71 70  Resp: (!) 32 18  Temp:      Last Pain:  Vitals:   06/14/16 1036  TempSrc:   PainSc: Asleep                 Martha Clan

## 2016-06-21 ENCOUNTER — Encounter (HOSPITAL_BASED_OUTPATIENT_CLINIC_OR_DEPARTMENT_OTHER)
Admission: RE | Admit: 2016-06-21 | Discharge: 2016-06-21 | Disposition: A | Discharge status: Home / Self Care | Payer: Medicare PPO | Source: Ambulatory Visit | Attending: Psychiatry | Admitting: Psychiatry

## 2016-06-21 ENCOUNTER — Other Ambulatory Visit: Payer: Self-pay | Admitting: Psychiatry

## 2016-06-21 ENCOUNTER — Encounter: Payer: Self-pay | Admitting: Anesthesiology

## 2016-06-21 DIAGNOSIS — E669 Obesity, unspecified: Secondary | ICD-10-CM | POA: Diagnosis not present

## 2016-06-21 DIAGNOSIS — F332 Major depressive disorder, recurrent severe without psychotic features: Secondary | ICD-10-CM

## 2016-06-21 DIAGNOSIS — F339 Major depressive disorder, recurrent, unspecified: Secondary | ICD-10-CM | POA: Diagnosis not present

## 2016-06-21 DIAGNOSIS — E7800 Pure hypercholesterolemia, unspecified: Secondary | ICD-10-CM | POA: Diagnosis not present

## 2016-06-21 DIAGNOSIS — I10 Essential (primary) hypertension: Secondary | ICD-10-CM | POA: Diagnosis not present

## 2016-06-21 DIAGNOSIS — F319 Bipolar disorder, unspecified: Secondary | ICD-10-CM | POA: Diagnosis not present

## 2016-06-21 DIAGNOSIS — E119 Type 2 diabetes mellitus without complications: Secondary | ICD-10-CM | POA: Diagnosis not present

## 2016-06-21 DIAGNOSIS — K219 Gastro-esophageal reflux disease without esophagitis: Secondary | ICD-10-CM | POA: Diagnosis not present

## 2016-06-21 DIAGNOSIS — E1142 Type 2 diabetes mellitus with diabetic polyneuropathy: Secondary | ICD-10-CM | POA: Diagnosis not present

## 2016-06-21 MED ORDER — KETOROLAC TROMETHAMINE 30 MG/ML IJ SOLN
INTRAMUSCULAR | Status: AC
  Administered 2016-06-21: 30 mg via INTRAVENOUS
  Filled 2016-06-21: qty 1

## 2016-06-21 MED ORDER — SODIUM CHLORIDE 0.9 % IV SOLN
INTRAVENOUS | Status: DC | PRN
  Administered 2016-06-21: 09:00:00 via INTRAVENOUS

## 2016-06-21 MED ORDER — KETOROLAC TROMETHAMINE 30 MG/ML IJ SOLN
30.0000 mg | Freq: Once | INTRAMUSCULAR | Status: AC
  Administered 2016-06-21: 30 mg via INTRAVENOUS

## 2016-06-21 MED ORDER — LABETALOL HCL 5 MG/ML IV SOLN
INTRAVENOUS | Status: DC | PRN
  Administered 2016-06-21: 20 mg via INTRAVENOUS

## 2016-06-21 MED ORDER — GLYCOPYRROLATE 0.2 MG/ML IJ SOLN
INTRAMUSCULAR | Status: AC
  Administered 2016-06-21: 0.4 mg via INTRAVENOUS
  Filled 2016-06-21: qty 2

## 2016-06-21 MED ORDER — SUCCINYLCHOLINE CHLORIDE 20 MG/ML IJ SOLN
INTRAMUSCULAR | Status: DC | PRN
  Administered 2016-06-21: 100 mg via INTRAVENOUS

## 2016-06-21 MED ORDER — METHOHEXITAL SODIUM 100 MG/10ML IV SOSY
PREFILLED_SYRINGE | INTRAVENOUS | Status: DC | PRN
  Administered 2016-06-21: 70 mg via INTRAVENOUS

## 2016-06-21 MED ORDER — ESMOLOL HCL 100 MG/10ML IV SOLN
INTRAVENOUS | Status: DC | PRN
  Administered 2016-06-21: 10 mg via INTRAVENOUS

## 2016-06-21 MED ORDER — SODIUM CHLORIDE 0.9 % IV SOLN
250.0000 mL | Freq: Once | INTRAVENOUS | Status: AC
  Administered 2016-06-21: 500 mL via INTRAVENOUS

## 2016-06-21 MED ORDER — GLYCOPYRROLATE 0.2 MG/ML IJ SOLN
0.4000 mg | Freq: Once | INTRAMUSCULAR | Status: AC
  Administered 2016-06-21: 0.4 mg via INTRAVENOUS

## 2016-06-21 NOTE — Anesthesia Procedure Notes (Signed)
Date/Time: 06/21/2016 10:11 AM Performed by: Johnna Acosta Pre-anesthesia Checklist: Patient identified, Timeout performed, Emergency Drugs available, Suction available and Patient being monitored Patient Re-evaluated:Patient Re-evaluated prior to inductionOxygen Delivery Method: Circle system utilized Preoxygenation: Pre-oxygenation with 100% oxygen Intubation Type: IV induction Ventilation: Mask ventilation throughout procedure

## 2016-06-21 NOTE — Procedures (Signed)
ECT SERVICES Physician's Interval Evaluation & Treatment Note  Patient Identification: Melinda [name] MRN:  [id number] Date of Evaluation:  06/21/2016 TX #: [id number]  MADRS:   MMSE:   P.E. Findings:  No change to physical exam vital stable  Psychiatric Interval Note:  Mood stable  Subjective:  Patient is a 45 y.o. female seen for evaluation for Electroconvulsive Therapy. No specific complaint  Treatment Summary:   []   Right Unilateral             [x]  Bilateral   % Energy : 1.0 ms 35%   Impedance: 760 ohms  Seizure Energy Index: 1695 V squared  Postictal Suppression Index: 91%  Seizure Concordance Index: 86%  Medications  Pre Shock: Robinul 0.4 mg, labetalol 20 mg, esmolol 10 mg, Toradol 30 mg, Brevital 70 mg, succinylcholine 100 mg  Post Shock:    Seizure Duration: 35 seconds by EMG, 45 seconds by EEG   Comments: Follow-up one week   Lungs:  [x]   Clear to auscultation               []  Other:   Heart:    [x]   Regular rhythm             []  irregular rhythm    [x]   Previous H&P reviewed, patient examined and there are NO CHANGES                 []   Previous H&P reviewed, patient examined and there are changes noted.   [name], MD 8/11/201710:07 AM

## 2016-06-21 NOTE — Transfer of Care (Signed)
Immediate Anesthesia Transfer of Care Note  Patient: Melinda [name]  Procedure(s) Performed: * No procedures listed *  Patient Location: PACU  Anesthesia Type:General  Level of Consciousness: sedated  Airway & Oxygen Therapy: Patient Spontanous Breathing and Patient connected to face mask oxygen  Post-op Assessment: Report given to RN and Post -op Vital signs reviewed and stable  Post vital signs: Reviewed and stable  Last Vitals:  Vitals:   06/21/16 0808 06/21/16 1017  BP: (!) 144/70   Pulse: 95 (!) 102  Resp: 16 (!) 27  Temp: 36.5 C 37.3 C    Last Pain:  Vitals:   06/21/16 1017  TempSrc: Tympanic         Complications: No apparent anesthesia complications

## 2016-06-21 NOTE — H&P (Signed)
Melinda [name] is an 45 y.o. female.   Chief Complaint: No specific new complaint HPI: Chronic depression stable and well with ECT  Past Medical History:  Diagnosis Date  . Depression   . Diabetes mellitus without complication ([place])   . Diabetic peripheral neuropathy ([place]) 03/07/15  . Diabetic peripheral neuropathy ([place]) 03/07/15  . Diabetic peripheral neuropathy ([place]) 03/07/15  . GERD (gastroesophageal reflux disease)   . Hypercholesterolemia 03/07/15  . Hypertension   . Obesity 03/07/15  . Personality disorder 03/07/15  . Sinus tachycardia ([place]) 03/07/15   history of  . Suicidal thoughts 03/07/15    Past Surgical History:  Procedure Laterality Date  . electroconvulsion therapy  03/07/15    Family History  Problem Relation Age of Onset  . Hypertension Father   . Diabetes Mother    Social History:  reports that she has never smoked. She has never used smokeless tobacco. She reports that she does not drink alcohol or use drugs.  Allergies:  Allergies  Allergen Reactions  . Prednisone     Increases blood sugar     (Not in a hospital admission)  No results found for this or any previous visit (from the past 48 hour(s)). No results found.  Review of Systems  Constitutional: Negative.   HENT: Negative.   Eyes: Negative.   Respiratory: Negative.   Cardiovascular: Negative.   Gastrointestinal: Negative.   Musculoskeletal: Negative.   Skin: Negative.   Neurological: Negative.   Psychiatric/Behavioral: Negative.     Blood pressure (!) 144/70, pulse 95, temperature 97.7 F (36.5 C), temperature source Oral, resp. rate 16, height 5\' 5"  (1.651 m), weight 99.8 kg (220 lb), SpO2 100 %. Physical Exam  Nursing note and vitals reviewed. Constitutional: She appears well-developed and well-nourished.  HENT:  Head: Normocephalic and atraumatic.  Eyes: Conjunctivae are normal. Pupils are equal, round, and reactive to light.  Neck: Normal range of motion.  Cardiovascular:  Regular rhythm and normal heart sounds.   Respiratory: Effort normal.  GI: Soft.  Musculoskeletal: Normal range of motion.  Neurological: She is alert.  Skin: Skin is warm and dry.  Psychiatric: She has a normal mood and affect. Her behavior is normal. Judgment and thought content normal.     Assessment/Plan Supportive counseling and review of treatment plan. No change of medication. ECT today follow-up one week  [name], MD 06/21/2016, 10:06 AM

## 2016-06-21 NOTE — Discharge Instructions (Signed)
1)  The drugs that you have been given will stay in your system until tomorrow so for the       next 24 hours you should not:  A. Drive an automobile  B. Make any legal decisions  C. Drink any alcoholic beverages  2)  You may resume your regular meals upon return home.  3)  A responsible adult must take you home.  Someone should stay with you for a few          hours, then be available by phone for the remainder of the treatment day.  4)  You May experience any of the following symptoms:  Headache, Nausea and a dry mouth (due to the medications you were given),  temporary memory loss and some confusion, or sore muscles (a warm bath  should help this).  If you you experience any of these symptoms let us know on                your return visit.  5)  Report any of the following: any acute discomfort, severe headache, or temperature        greater than 100.5 F.   Also report any unusual redness, swelling, drainage, or pain         at your IV site.    You may report Symptoms to:  [place] at [facility]          Phone: [phone], ECT Department           or Dr. Prescott Gum office [phone]  6)  Your next ECT Treatment is Day Friday  Date August 18  We will call 2 days prior to your scheduled appointment for arrival times.  7)  Nothing to eat or drink after midnight the night before your procedure.  8)  Take Lisinopril     With a sip of water the morning of your procedure.  9)  Other Instructions: Call [phone] to cancel the morning of your procedure due         to illness or emergency.  10) We will call within 72 hours to assess how you are feeling.

## 2016-06-21 NOTE — Anesthesia Preprocedure Evaluation (Signed)
Anesthesia Evaluation  Patient identified by MRN, date of birth, ID band Patient awake    Reviewed: Allergy & Precautions, H&P , NPO status , Patient's Chart, lab work & pertinent test results, reviewed documented beta blocker date and time   Airway Mallampati: II   Neck ROM: full    Dental  (+) Poor Dentition, Chipped   Pulmonary neg pulmonary ROS, sleep apnea ,    Pulmonary exam normal        Cardiovascular hypertension, negative cardio ROS Normal cardiovascular exam Rate:Normal     Neuro/Psych negative neurological ROS  negative psych ROS   GI/Hepatic negative GI ROS, Neg liver ROS, GERD  Medicated,  Endo/Other  negative endocrine ROSdiabetes  Renal/GU negative Renal ROS  negative genitourinary   Musculoskeletal   Abdominal   Peds  Hematology negative hematology ROS (+)   Anesthesia Other Findings Past Medical History: No date: Depression No date: Diabetes mellitus without complication ([place]) [id number]: Diabetic peripheral neuropathy ([place]) 03/07/15: Diabetic peripheral neuropathy ([place]) 03/07/15: Diabetic peripheral neuropathy ([facility]) No date: GERD (gastroesophageal reflux disease) 03/07/15: Hypercholesterolemia No date: Hypertension 03/07/15: Obesity 03/07/15: Personality disorder 03/07/15: Sinus tachycardia ([place])     Comment: history of 03/07/15: Suicidal thoughts Past Surgical History: 03/07/15: electroconvulsion therapy BMI    Body Mass Index:  36.44 kg/m     Reproductive/Obstetrics                             Anesthesia Physical  Anesthesia Plan  ASA: III  Anesthesia Plan: General   Post-op Pain Management:    Induction:   Airway Management Planned:   Additional Equipment:   Intra-op Plan:   Post-operative Plan:   Informed Consent: I have reviewed the patients History and Physical, chart, labs and discussed the procedure including the risks, benefits and alternatives  for the proposed anesthesia with the patient or authorized representative who has indicated his/her understanding and acceptance.   Dental Advisory Given  Plan Discussed with: CRNA  Anesthesia Plan Comments:         Anesthesia Quick Evaluation

## 2016-06-21 NOTE — Anesthesia Postprocedure Evaluation (Signed)
Anesthesia Post Note  Patient: Melinda [name]  Procedure(s) Performed: * No procedures listed *  Patient location during evaluation: PACU Anesthesia Type: General Level of consciousness: awake and alert Pain management: pain level controlled Vital Signs Assessment: post-procedure vital signs reviewed and stable Respiratory status: spontaneous breathing, nonlabored ventilation, respiratory function stable and patient connected to nasal cannula oxygen Cardiovascular status: blood pressure returned to baseline and stable Postop Assessment: no signs of nausea or vomiting Anesthetic complications: no    Last Vitals:  Vitals:   06/21/16 1047 06/21/16 1111  BP:  129/84  Pulse: 87 82  Resp: 18 18  Temp: 37.1 C     Last Pain:  Vitals:   06/21/16 1047  TempSrc:   PainSc: 0-No pain                 Precious Haws Kaysha Parsell

## 2016-06-24 LAB — POCT PREGNANCY, URINE: Preg Test, Ur: NEGATIVE

## 2016-06-24 LAB — GLUCOSE, CAPILLARY: Glucose-Capillary: 87 mg/dL (ref 65–99)

## 2016-06-28 ENCOUNTER — Encounter: Payer: Self-pay | Admitting: Anesthesiology

## 2016-06-28 ENCOUNTER — Encounter (HOSPITAL_BASED_OUTPATIENT_CLINIC_OR_DEPARTMENT_OTHER)
Admission: RE | Admit: 2016-06-28 | Discharge: 2016-06-28 | Disposition: A | Discharge status: Home / Self Care | Payer: Medicare PPO | Source: Ambulatory Visit | Attending: Psychiatry | Admitting: Psychiatry

## 2016-06-28 ENCOUNTER — Other Ambulatory Visit: Payer: Self-pay | Admitting: Psychiatry

## 2016-06-28 DIAGNOSIS — E119 Type 2 diabetes mellitus without complications: Secondary | ICD-10-CM | POA: Diagnosis not present

## 2016-06-28 DIAGNOSIS — E669 Obesity, unspecified: Secondary | ICD-10-CM | POA: Diagnosis not present

## 2016-06-28 DIAGNOSIS — F332 Major depressive disorder, recurrent severe without psychotic features: Secondary | ICD-10-CM

## 2016-06-28 DIAGNOSIS — F339 Major depressive disorder, recurrent, unspecified: Secondary | ICD-10-CM | POA: Diagnosis not present

## 2016-06-28 DIAGNOSIS — K219 Gastro-esophageal reflux disease without esophagitis: Secondary | ICD-10-CM | POA: Diagnosis not present

## 2016-06-28 DIAGNOSIS — E1142 Type 2 diabetes mellitus with diabetic polyneuropathy: Secondary | ICD-10-CM | POA: Diagnosis not present

## 2016-06-28 DIAGNOSIS — E7800 Pure hypercholesterolemia, unspecified: Secondary | ICD-10-CM | POA: Diagnosis not present

## 2016-06-28 DIAGNOSIS — I10 Essential (primary) hypertension: Secondary | ICD-10-CM | POA: Diagnosis not present

## 2016-06-28 LAB — GLUCOSE, CAPILLARY: Glucose-Capillary: 92 mg/dL (ref 65–99)

## 2016-06-28 LAB — POCT PREGNANCY, URINE: Preg Test, Ur: NEGATIVE

## 2016-06-28 MED ORDER — SODIUM CHLORIDE 0.9 % IV SOLN
500.0000 mL | Freq: Once | INTRAVENOUS | Status: AC
  Administered 2016-06-28: 500 mL via INTRAVENOUS

## 2016-06-28 MED ORDER — KETOROLAC TROMETHAMINE 30 MG/ML IJ SOLN
INTRAMUSCULAR | Status: AC
  Administered 2016-06-28: 30 mg via INTRAVENOUS
  Filled 2016-06-28: qty 1

## 2016-06-28 MED ORDER — SODIUM CHLORIDE 0.9 % IV SOLN
INTRAVENOUS | Status: DC | PRN
  Administered 2016-06-28: 10:00:00 via INTRAVENOUS

## 2016-06-28 MED ORDER — ESMOLOL HCL 100 MG/10ML IV SOLN
INTRAVENOUS | Status: DC | PRN
  Administered 2016-06-28: 10 mg via INTRAVENOUS

## 2016-06-28 MED ORDER — SUCCINYLCHOLINE CHLORIDE 20 MG/ML IJ SOLN
INTRAMUSCULAR | Status: DC | PRN
  Administered 2016-06-28: 100 mg via INTRAVENOUS

## 2016-06-28 MED ORDER — LABETALOL HCL 5 MG/ML IV SOLN
INTRAVENOUS | Status: DC | PRN
  Administered 2016-06-28: 20 mg via INTRAVENOUS

## 2016-06-28 MED ORDER — KETOROLAC TROMETHAMINE 30 MG/ML IJ SOLN
30.0000 mg | Freq: Once | INTRAMUSCULAR | Status: AC
  Administered 2016-06-28: 30 mg via INTRAVENOUS

## 2016-06-28 MED ORDER — GLYCOPYRROLATE 0.2 MG/ML IJ SOLN
0.4000 mg | Freq: Once | INTRAMUSCULAR | Status: AC
  Administered 2016-06-28: 0.4 mg via INTRAVENOUS

## 2016-06-28 MED ORDER — METHOHEXITAL SODIUM 100 MG/10ML IV SOSY
PREFILLED_SYRINGE | INTRAVENOUS | Status: DC | PRN
  Administered 2016-06-28: 70 mg via INTRAVENOUS

## 2016-06-28 MED ORDER — GLYCOPYRROLATE 0.2 MG/ML IJ SOLN
INTRAMUSCULAR | Status: AC
  Administered 2016-06-28: 0.4 mg via INTRAVENOUS
  Filled 2016-06-28: qty 2

## 2016-06-28 NOTE — Transfer of Care (Signed)
Immediate Anesthesia Transfer of Care Note  Patient: Melinda [name]  Procedure(s) Performed: * No procedures listed *  Patient Location: PACU  Anesthesia Type:General  Level of Consciousness: awake and patient cooperative  Airway & Oxygen Therapy: Patient Spontanous Breathing and Patient connected to face mask oxygen  Post-op Assessment: Report given to RN, Post -op Vital signs reviewed and stable and Patient moving all extremities X 4  Post vital signs: Reviewed and stable  Last Vitals:  Vitals:   06/28/16 0815  BP: 131/78  Pulse: (!) 104  Resp: 18  Temp: 36.6 C    Last Pain:  Vitals:   06/28/16 0815  TempSrc: Tympanic         Complications: No apparent anesthesia complications

## 2016-06-28 NOTE — Procedures (Signed)
ECT SERVICES Physician's Interval Evaluation & Treatment Note  Patient Identification: Melinda [name] MRN:  [id number] Date of Evaluation:  06/28/2016 TX #: [id number]   MADRS:   MMSE:   P.E. Findings:  No change to physical exam lungs and heart clear.  Psychiatric Interval Note:  Affect mildly dysphoric reactive no loosening of associations no psychosis no acute suicidality.  Subjective:  Patient is a 45 y.o. female seen for evaluation for Electroconvulsive Therapy. No specific new complaints  Treatment Summary:   []   Right Unilateral             [x]  Bilateral   % Energy : 0.3 ms 35%   Impedance: 810 ohms  Seizure Energy Index: 6654 V squared  Postictal Suppression Index: 91%  Seizure Concordance Index: 97%  Medications  Pre Shock: Robinul 0.4 mg, labetalol 20 mg, esmolol 10 mg, Toradol 30 mg, Brevital 70 mg, succinylcholine 100 mg  Post Shock: None  Seizure Duration: EMG 40 seconds, EEG 82 seconds   Comments: Supportive counseling to the patient review of treatment plan. We will see her back in 2 weeks.   Lungs:  [x]   Clear to auscultation               []  Other:   Heart:    [x]   Regular rhythm             []  irregular rhythm    [x]   Previous H&P reviewed, patient examined and there are NO CHANGES                 []   Previous H&P reviewed, patient examined and there are changes noted.   Melinda Berthold, MD 8/18/201710:20 AM

## 2016-06-28 NOTE — H&P (Signed)
Melinda [name] is an 45 y.o. female.   Chief Complaint: Patient with long-standing depression. Some sadness recently related to home environment situation. Chronic thoughts about self-harm. No intent to kill self. HPI: Chronic depression. Maintained well with regular ECT treatments.  Past Medical History:  Diagnosis Date  . Depression   . Diabetes mellitus without complication ([place])   . Diabetic peripheral neuropathy ([place]) 03/07/15  . Diabetic peripheral neuropathy ([place]) 03/07/15  . Diabetic peripheral neuropathy ([place]) 03/07/15  . GERD (gastroesophageal reflux disease)   . Hypercholesterolemia 03/07/15  . Hypertension   . Obesity 03/07/15  . Personality disorder 03/07/15  . Sinus tachycardia ([place]) 03/07/15   history of  . Suicidal thoughts 03/07/15    Past Surgical History:  Procedure Laterality Date  . electroconvulsion therapy  03/07/15    Family History  Problem Relation Age of Onset  . Hypertension Father   . Diabetes Mother    Social History:  reports that she has never smoked. She has never used smokeless tobacco. She reports that she does not drink alcohol or use drugs.  Allergies:  Allergies  Allergen Reactions  . Prednisone     Increases blood sugar     (Not in a hospital admission)  Results for orders placed or performed during the hospital encounter of 06/28/16 (from the past 48 hour(s))  Pregnancy, urine POC     Status: None   Collection Time: 06/28/16  8:19 AM  Result Value Ref Range   Preg Test, Ur NEGATIVE NEGATIVE    Comment:        THE SENSITIVITY OF THIS METHODOLOGY IS >24 mIU/mL   Glucose, capillary     Status: None   Collection Time: 06/28/16  8:23 AM  Result Value Ref Range   Glucose-Capillary 92 65 - 99 mg/dL   No results found.  Review of Systems  Constitutional: Negative.   HENT: Negative.   Eyes: Negative.   Respiratory: Negative.   Cardiovascular: Negative.   Gastrointestinal: Negative.   Musculoskeletal: Negative.   Skin:  Negative.   Neurological: Negative.   Psychiatric/Behavioral: Positive for depression. Negative for hallucinations, memory loss, substance abuse and suicidal ideas. The patient is nervous/anxious. The patient does not have insomnia.     Blood pressure 131/78, pulse (!) 104, temperature 97.8 F (36.6 C), temperature source Tympanic, resp. rate 18, weight 98 kg (216 lb), SpO2 98 %. Physical Exam  Nursing note and vitals reviewed. Constitutional: She appears well-developed and well-nourished.  HENT:  Head: Normocephalic and atraumatic.  Eyes: Conjunctivae are normal. Pupils are equal, round, and reactive to light.  Neck: Normal range of motion.  Cardiovascular: Regular rhythm and normal heart sounds.   Respiratory: Effort normal. No respiratory distress.  GI: Soft.  Musculoskeletal: Normal range of motion.  Neurological: She is alert.  Skin: Skin is warm and dry.  Psychiatric: She has a normal mood and affect. Her behavior is normal. Judgment and thought content normal.     Assessment/Plan No change to physical exam. She is aware that we are not having treatment next week follow-up treatment will be in 2 weeks.  [name], MD 06/28/2016, 10:18 AM

## 2016-06-28 NOTE — Anesthesia Preprocedure Evaluation (Signed)
Anesthesia Evaluation  Patient identified by MRN, date of birth, ID band Patient awake    Reviewed: Allergy & Precautions, NPO status , Patient's Chart, lab work & pertinent test results, reviewed documented beta blocker date and time   Airway Mallampati: III  TM Distance: >3 FB     Dental  (+) Chipped   Pulmonary sleep apnea ,           Cardiovascular hypertension, Pt. on medications      Neuro/Psych PSYCHIATRIC DISORDERS Depression Bipolar Disorder  Neuromuscular disease    GI/Hepatic GERD  Controlled,  Endo/Other  diabetes, Type 2Morbid obesity  Renal/GU      Musculoskeletal   Abdominal   Peds  Hematology   Anesthesia Other Findings   Reproductive/Obstetrics                             Anesthesia Physical Anesthesia Plan  ASA: III  Anesthesia Plan: General   Post-op Pain Management:    Induction: Intravenous  Airway Management Planned: Mask  Additional Equipment:   Intra-op Plan:   Post-operative Plan:   Informed Consent: I have reviewed the patients History and Physical, chart, labs and discussed the procedure including the risks, benefits and alternatives for the proposed anesthesia with the patient or authorized representative who has indicated his/her understanding and acceptance.     Plan Discussed with: CRNA  Anesthesia Plan Comments:         Anesthesia Quick Evaluation

## 2016-06-28 NOTE — Discharge Instructions (Addendum)
1)  The drugs that you have been given will stay in your system until tomorrow so for the       next 24 hours you should not:  A. Drive an automobile  B. Make any legal decisions  C. Drink any alcoholic beverages  2)  You may resume your regular meals upon return home.  3)  A responsible adult must take you home.  Someone should stay with you for a few          hours, then be available by phone for the remainder of the treatment day.  4)  You May experience any of the following symptoms:  Headache, Nausea and a dry mouth (due to the medications you were given),  temporary memory loss and some confusion, or sore muscles (a warm bath  should help this).  If you you experience any of these symptoms let us know on                your return visit.  5)  Report any of the following: any acute discomfort, severe headache, or temperature        greater than 100.5 F.   Also report any unusual redness, swelling, drainage, or pain         at your IV site.    You may report Symptoms to:  [place] at [facility]          Phone: [phone], ECT Department           or Dr. Prescott Gum office [phone]  6)  Your next ECT Treatment is Friday, Sept. 1, 2017.  We will call 2 days prior to your scheduled appointment for arrival times.  7)  Nothing to eat or drink after midnight the night before your procedure.  8)  Take lisopril with a sip of water the morning of your procedure.  9)  Other Instructions: Call [phone] to cancel the morning of your procedure due         to illness or emergency.  10) We will call within 72 hours to assess how you are feeling.

## 2016-06-28 NOTE — Anesthesia Postprocedure Evaluation (Signed)
Anesthesia Post Note  Patient: Melinda [name]  Procedure(s) Performed: * No procedures listed *  Patient location during evaluation: PACU Anesthesia Type: General Level of consciousness: awake and alert Pain management: pain level controlled Vital Signs Assessment: post-procedure vital signs reviewed and stable Respiratory status: spontaneous breathing, nonlabored ventilation, respiratory function stable and patient connected to nasal cannula oxygen Cardiovascular status: blood pressure returned to baseline and stable Postop Assessment: no signs of nausea or vomiting Anesthetic complications: no    Last Vitals:  Vitals:   06/28/16 1108 06/28/16 1122  BP: 108/70 125/77  Pulse: 94 91  Resp: (!) 24 16  Temp: 36.7 C     Last Pain:  Vitals:   06/28/16 0815  TempSrc: Tympanic                 Matthe Sloane S

## 2016-07-11 ENCOUNTER — Other Ambulatory Visit: Payer: Self-pay | Admitting: Psychiatry

## 2016-07-12 ENCOUNTER — Encounter: Payer: Self-pay | Admitting: Anesthesiology

## 2016-07-12 ENCOUNTER — Encounter
Admission: RE | Admit: 2016-07-12 | Discharge: 2016-07-12 | Disposition: A | Discharge status: Home / Self Care | Payer: Medicare PPO | Source: Ambulatory Visit | Attending: Psychiatry | Admitting: Psychiatry

## 2016-07-12 DIAGNOSIS — F329 Major depressive disorder, single episode, unspecified: Secondary | ICD-10-CM | POA: Insufficient documentation

## 2016-07-12 DIAGNOSIS — F319 Bipolar disorder, unspecified: Secondary | ICD-10-CM | POA: Diagnosis not present

## 2016-07-12 DIAGNOSIS — I10 Essential (primary) hypertension: Secondary | ICD-10-CM | POA: Diagnosis not present

## 2016-07-12 DIAGNOSIS — F332 Major depressive disorder, recurrent severe without psychotic features: Secondary | ICD-10-CM | POA: Diagnosis not present

## 2016-07-12 DIAGNOSIS — E119 Type 2 diabetes mellitus without complications: Secondary | ICD-10-CM | POA: Diagnosis not present

## 2016-07-12 LAB — POCT PREGNANCY, URINE: Preg Test, Ur: NEGATIVE

## 2016-07-12 LAB — GLUCOSE, CAPILLARY: Glucose-Capillary: 104 mg/dL — ABNORMAL HIGH (ref 65–99)

## 2016-07-12 MED ORDER — GLYCOPYRROLATE 0.2 MG/ML IJ SOLN
0.4000 mg | Freq: Once | INTRAMUSCULAR | Status: AC
  Administered 2016-07-12: 4 mg via INTRAVENOUS

## 2016-07-12 MED ORDER — KETOROLAC TROMETHAMINE 30 MG/ML IJ SOLN
30.0000 mg | Freq: Once | INTRAMUSCULAR | Status: AC
  Administered 2016-07-12: 09:00:00 via INTRAVENOUS

## 2016-07-12 MED ORDER — KETOROLAC TROMETHAMINE 30 MG/ML IJ SOLN
INTRAMUSCULAR | Status: AC
  Filled 2016-07-12: qty 1

## 2016-07-12 MED ORDER — SODIUM CHLORIDE 0.9 % IV SOLN
500.0000 mL | Freq: Once | INTRAVENOUS | Status: AC
  Administered 2016-07-12: 500 mL via INTRAVENOUS

## 2016-07-12 MED ORDER — SODIUM CHLORIDE 0.9 % IV SOLN
INTRAVENOUS | Status: DC | PRN
  Administered 2016-07-12 (×2): via INTRAVENOUS

## 2016-07-12 MED ORDER — LABETALOL HCL 5 MG/ML IV SOLN
INTRAVENOUS | Status: DC | PRN
  Administered 2016-07-12: 20 mg via INTRAVENOUS

## 2016-07-12 MED ORDER — SUCCINYLCHOLINE CHLORIDE 200 MG/10ML IV SOSY
PREFILLED_SYRINGE | INTRAVENOUS | Status: DC | PRN
  Administered 2016-07-12: 100 mg via INTRAVENOUS

## 2016-07-12 MED ORDER — GLYCOPYRROLATE 0.2 MG/ML IJ SOLN
INTRAMUSCULAR | Status: AC
  Administered 2016-07-12: 4 mg via INTRAVENOUS
  Filled 2016-07-12: qty 2

## 2016-07-12 MED ORDER — ESMOLOL HCL 100 MG/10ML IV SOLN
INTRAVENOUS | Status: DC | PRN
  Administered 2016-07-12: 10 mg via INTRAVENOUS

## 2016-07-12 MED ORDER — METHOHEXITAL SODIUM 100 MG/10ML IV SOSY
PREFILLED_SYRINGE | INTRAVENOUS | Status: DC | PRN
  Administered 2016-07-12: 70 mg via INTRAVENOUS

## 2016-07-12 NOTE — H&P (Signed)
Melinda [name] is an 45 y.o. female.   Chief Complaint: A little more depressed this week. Mrs. treatment last week because of scheduling. A little bit of thought about razor blades but no actual suicidal intent. HPI: Chronic depression unresponsive to medication and maintenance ECT.  Past Medical History:  Diagnosis Date  . Depression   . Diabetes mellitus without complication ([place])   . Diabetic peripheral neuropathy ([place]) 03/07/15  . Diabetic peripheral neuropathy ([place]) 03/07/15  . Diabetic peripheral neuropathy ([place]) 03/07/15  . GERD (gastroesophageal reflux disease)   . Hypercholesterolemia 03/07/15  . Hypertension   . Obesity 03/07/15  . Personality disorder 03/07/15  . Sinus tachycardia ([place]) 03/07/15   history of  . Suicidal thoughts 03/07/15    Past Surgical History:  Procedure Laterality Date  . electroconvulsion therapy  03/07/15    Family History  Problem Relation Age of Onset  . Hypertension Father   . Diabetes Mother    Social History:  reports that she has never smoked. She has never used smokeless tobacco. She reports that she does not drink alcohol or use drugs.  Allergies:  Allergies  Allergen Reactions  . Prednisone     Increases blood sugar     (Not in a hospital admission)  Results for orders placed or performed during the hospital encounter of 07/12/16 (from the past 48 hour(s))  Pregnancy, urine POC     Status: None   Collection Time: 07/12/16  8:46 AM  Result Value Ref Range   Preg Test, Ur NEGATIVE NEGATIVE    Comment:        THE SENSITIVITY OF THIS METHODOLOGY IS >24 mIU/mL   Glucose, capillary     Status: Abnormal   Collection Time: 07/12/16  8:53 AM  Result Value Ref Range   Glucose-Capillary 104 (H) 65 - 99 mg/dL   Comment 1 Notify RN    No results found.  Review of Systems  Constitutional: Negative.   HENT: Negative.   Eyes: Negative.   Respiratory: Negative.   Cardiovascular: Negative.   Gastrointestinal: Negative.    Musculoskeletal: Negative.   Skin: Negative.   Neurological: Negative.   Psychiatric/Behavioral: Positive for depression. Negative for hallucinations, memory loss, substance abuse and suicidal ideas. The patient is not nervous/anxious and does not have insomnia.     Blood pressure (!) 129/91, pulse (!) 102, temperature (!) 96.2 F (35.7 C), temperature source Tympanic, resp. rate 16, weight 97.1 kg (214 lb), last menstrual period 07/10/2016, SpO2 99 %. Physical Exam  Nursing note and vitals reviewed. Constitutional: She appears well-developed and well-nourished.  HENT:  Head: Normocephalic and atraumatic.  Eyes: Conjunctivae are normal. Pupils are equal, round, and reactive to light.  Neck: Normal range of motion.  Cardiovascular: Regular rhythm and normal heart sounds.   Respiratory: Effort normal. No respiratory distress.  GI: Soft.  Musculoskeletal: Normal range of motion.  Neurological: She is alert.  Skin: Skin is warm and dry.  Psychiatric: She has a normal mood and affect. Her behavior is normal. Judgment and thought content normal.     Assessment/Plan Treatment today. Supportive counseling. Review of treatment plan. Follow-up next week.  [name], MD 07/12/2016, 10:23 AM

## 2016-07-12 NOTE — Transfer of Care (Addendum)
Immediate Anesthesia Transfer of Care Note  Patient: Melinda [name]  Procedure(s) Performed: ECT  Patient Location: PACU  Anesthesia Type:General  Level of Consciousness: awake and confused  Airway & Oxygen Therapy: Patient Spontanous Breathing and Patient connected to face mask oxygen  Post-op Assessment: Report given to RN and Post -op Vital signs reviewed and stable  Post vital signs: Reviewed and stable  Last Vitals:  Vitals:   07/12/16 0857 07/12/16 1044  BP: (!) 129/91 (!) 118/93  Pulse: (!) 102 (!) 102  Resp: 16 17  Temp: (!) 35.7 C 37.4 C    Last Pain:  Vitals:   07/12/16 0857  TempSrc: Tympanic         Complications: No apparent anesthesia complications

## 2016-07-12 NOTE — Anesthesia Procedure Notes (Signed)
Date/Time: 07/12/2016 10:32 AM Performed by: Dionne Bucy Pre-anesthesia Checklist: Patient identified, Emergency Drugs available, Suction available and Patient being monitored Patient Re-evaluated:Patient Re-evaluated prior to inductionOxygen Delivery Method: Circle system utilized Preoxygenation: Pre-oxygenation with 100% oxygen Intubation Type: IV induction Ventilation: Mask ventilation without difficulty and Mask ventilation throughout procedure Airway Equipment and Method: Bite block Placement Confirmation: positive ETCO2 Dental Injury: Teeth and Oropharynx as per pre-operative assessment

## 2016-07-12 NOTE — Anesthesia Postprocedure Evaluation (Signed)
Anesthesia Post Note  Patient: Melinda [name]  Procedure(s) Performed: * No procedures listed *  Patient location during evaluation: PACU Anesthesia Type: General Level of consciousness: awake and alert Pain management: pain level controlled Vital Signs Assessment: post-procedure vital signs reviewed and stable Respiratory status: spontaneous breathing, nonlabored ventilation and respiratory function stable Cardiovascular status: blood pressure returned to baseline and stable Postop Assessment: no signs of nausea or vomiting Anesthetic complications: no    Last Vitals:  Vitals:   07/12/16 1044 07/12/16 1052  BP: (!) 118/93 113/87  Pulse: (!) 102 95  Resp: 17 (!) 24  Temp: 37.4 C     Last Pain:  Vitals:   07/12/16 0857  TempSrc: Tympanic                 Lonza Shimabukuro

## 2016-07-12 NOTE — Procedures (Signed)
ECT SERVICES Physician's Interval Evaluation & Treatment Note  Patient Identification: Melinda [name] MRN:  [id number] Date of Evaluation:  07/12/2016 TX #: [id number]  MADRS:   MMSE:   P.E. Findings:  No change to physical exam except slightly elevated blood pressure today.  Psychiatric Interval Note:  Mood is a little bit down but no suicidal or psychotic thinking.  Subjective:  Patient is a 45 y.o. female seen for evaluation for Electroconvulsive Therapy. A little bit more down.  Treatment Summary:   []   Right Unilateral             [x]  Bilateral   % Energy : 1.0 ms 35%   Impedance: 1270  Seizure Energy Index: 10,063 V  Postictal Suppression Index: 89%  Seizure Concordance Index: 99%     Medications  Pre Shock: Robinul 0.4 mg Toradol 30 mg labetalol 20 mg esmolol 10 mg Brevital 70 mg succinylcholine 100 mg  Post Shock:    Seizure Duration: 49 seconds by EMG, 74 seconds by EEG   Comments: Follow-up one week   Lungs:  [x]   Clear to auscultation               []  Other:   Heart:    [x]   Regular rhythm             []  irregular rhythm    [x]   Previous H&P reviewed, patient examined and there are NO CHANGES                 []   Previous H&P reviewed, patient examined and there are changes noted.   [name], MD 9/1/201710:25 AM

## 2016-07-12 NOTE — Anesthesia Preprocedure Evaluation (Signed)
Anesthesia Evaluation  Patient identified by MRN, date of birth, ID band Patient awake    Reviewed: Allergy & Precautions, H&P , NPO status , Patient's Chart, lab work & pertinent test results  History of Anesthesia Complications Negative for: history of anesthetic complications  Airway Mallampati: II   Neck ROM: full    Dental  (+) Poor Dentition, Chipped   Pulmonary sleep apnea , neg COPD,    Pulmonary exam normal        Cardiovascular hypertension, Pt. on medications (-) CAD and (-) Past MI negative cardio ROS Normal cardiovascular exam Rate:Normal     Neuro/Psych Depression Bipolar Disorder negative neurological ROS     GI/Hepatic negative GI ROS, Neg liver ROS, GERD  Medicated,  Endo/Other  diabetes, Type 2, Oral Hypoglycemic Agents  Renal/GU negative Renal ROS  negative genitourinary   Musculoskeletal   Abdominal (+) + obese,   Peds  Hematology negative hematology ROS (+)   Anesthesia Other Findings Past Medical History: No date: Depression No date: Diabetes mellitus without complication ([place]) 99991111: Diabetic peripheral neuropathy ([place]) 03/07/15: Diabetic peripheral neuropathy ([place]) 03/07/15: Diabetic peripheral neuropathy ([facility]) No date: GERD (gastroesophageal reflux disease) 03/07/15: Hypercholesterolemia No date: Hypertension 03/07/15: Obesity 03/07/15: Personality disorder 03/07/15: Sinus tachycardia ([place])     Comment: history of 03/07/15: Suicidal thoughts Past Surgical History: 03/07/15: electroconvulsion therapy BMI    Body Mass Index:  36.44 kg/m     Reproductive/Obstetrics                             Anesthesia Physical  Anesthesia Plan  ASA: III  Anesthesia Plan: General   Post-op Pain Management:    Induction: Intravenous  Airway Management Planned: Mask  Additional Equipment:   Intra-op Plan:   Post-operative Plan:   Informed Consent: I have  reviewed the patients History and Physical, chart, labs and discussed the procedure including the risks, benefits and alternatives for the proposed anesthesia with the patient or authorized representative who has indicated his/her understanding and acceptance.   Dental Advisory Given  Plan Discussed with: CRNA and Anesthesiologist  Anesthesia Plan Comments:         Anesthesia Quick Evaluation

## 2016-07-12 NOTE — Discharge Instructions (Signed)
1)  The drugs that you have been given will stay in your system until tomorrow so for the       next 24 hours you should not:  A. Drive an automobile  B. Make any legal decisions  C. Drink any alcoholic beverages  2)  You may resume your regular meals upon return home.  3)  A responsible adult must take you home.  Someone should stay with you for a few          hours, then be available by phone for the remainder of the treatment day.  4)  You May experience any of the following symptoms:  Headache, Nausea and a dry mouth (due to the medications you were given),  temporary memory loss and some confusion, or sore muscles (a warm bath  should help this).  If you you experience any of these symptoms let us know on                your return visit.  5)  Report any of the following: any acute discomfort, severe headache, or temperature        greater than 100.5 F.   Also report any unusual redness, swelling, drainage, or pain         at your IV site.    You may report Symptoms to:  [place] at [facility]          Phone: [phone], ECT Department           or Dr. Prescott Gum office [phone]  6)  Your next ECT Treatment is Day Friday  Date September 8 We will call 2 days prior to your scheduled appointment for arrival times.  7)  Nothing to eat or drink after midnight the night before your procedure.  8)  Take Lisinopril    With a sip of water the morning of your procedure.  9)  Other Instructions: Call [phone] to cancel the morning of your procedure due         to illness or emergency.  10) We will call within 72 hours to assess how you are feeling.

## 2016-07-19 ENCOUNTER — Other Ambulatory Visit: Payer: Self-pay | Admitting: Psychiatry

## 2016-07-19 ENCOUNTER — Encounter: Payer: Self-pay | Admitting: Anesthesiology

## 2016-07-19 ENCOUNTER — Encounter
Admission: RE | Admit: 2016-07-19 | Discharge: 2016-07-19 | Disposition: A | Discharge status: Home / Self Care | Payer: Medicare PPO | Source: Ambulatory Visit | Attending: Psychiatry | Admitting: Psychiatry

## 2016-07-19 DIAGNOSIS — F329 Major depressive disorder, single episode, unspecified: Secondary | ICD-10-CM | POA: Insufficient documentation

## 2016-07-19 DIAGNOSIS — E1142 Type 2 diabetes mellitus with diabetic polyneuropathy: Secondary | ICD-10-CM | POA: Diagnosis not present

## 2016-07-19 DIAGNOSIS — F319 Bipolar disorder, unspecified: Secondary | ICD-10-CM | POA: Diagnosis not present

## 2016-07-19 DIAGNOSIS — E119 Type 2 diabetes mellitus without complications: Secondary | ICD-10-CM | POA: Diagnosis not present

## 2016-07-19 DIAGNOSIS — F332 Major depressive disorder, recurrent severe without psychotic features: Secondary | ICD-10-CM | POA: Diagnosis not present

## 2016-07-19 LAB — POCT PREGNANCY, URINE: Preg Test, Ur: NEGATIVE

## 2016-07-19 LAB — GLUCOSE, CAPILLARY
Glucose-Capillary: 87 mg/dL (ref 65–99)
Glucose-Capillary: 122 mg/dL — ABNORMAL HIGH (ref 65–99)

## 2016-07-19 MED ORDER — KETOROLAC TROMETHAMINE 30 MG/ML IJ SOLN
30.0000 mg | Freq: Once | INTRAMUSCULAR | Status: AC
  Administered 2016-07-19: 30 mg via INTRAVENOUS

## 2016-07-19 MED ORDER — GLYCOPYRROLATE 0.2 MG/ML IJ SOLN
INTRAMUSCULAR | Status: AC
  Administered 2016-07-19: 0.4 mg via INTRAVENOUS
  Filled 2016-07-19: qty 2

## 2016-07-19 MED ORDER — ESMOLOL HCL 100 MG/10ML IV SOLN
INTRAVENOUS | Status: DC | PRN
  Administered 2016-07-19: 10 mg via INTRAVENOUS

## 2016-07-19 MED ORDER — KETOROLAC TROMETHAMINE 30 MG/ML IJ SOLN
INTRAMUSCULAR | Status: AC
  Administered 2016-07-19: 30 mg via INTRAVENOUS
  Filled 2016-07-19: qty 1

## 2016-07-19 MED ORDER — ESCITALOPRAM OXALATE 20 MG PO TABS: 20.0000 mg | ORAL_TABLET | Freq: Every day | ORAL | 0 refills | Status: DC

## 2016-07-19 MED ORDER — GLYCOPYRROLATE 0.2 MG/ML IJ SOLN
0.4000 mg | Freq: Once | INTRAMUSCULAR | Status: AC
  Administered 2016-07-19: 0.4 mg via INTRAVENOUS

## 2016-07-19 MED ORDER — LABETALOL HCL 5 MG/ML IV SOLN
INTRAVENOUS | Status: DC | PRN
  Administered 2016-07-19: 20 mg via INTRAVENOUS

## 2016-07-19 MED ORDER — SODIUM CHLORIDE 0.9 % IV SOLN
500.0000 mL | Freq: Once | INTRAVENOUS | Status: AC
  Administered 2016-07-19: 500 mL via INTRAVENOUS

## 2016-07-19 MED ORDER — METHOHEXITAL SODIUM 100 MG/10ML IV SOSY
PREFILLED_SYRINGE | INTRAVENOUS | Status: DC | PRN
  Administered 2016-07-19: 70 mg via INTRAVENOUS

## 2016-07-19 MED ORDER — SODIUM CHLORIDE 0.9 % IV SOLN
INTRAVENOUS | Status: DC | PRN
  Administered 2016-07-19: 10:00:00 via INTRAVENOUS

## 2016-07-19 MED ORDER — SUCCINYLCHOLINE CHLORIDE 200 MG/10ML IV SOSY
PREFILLED_SYRINGE | INTRAVENOUS | Status: DC | PRN
  Administered 2016-07-19: 100 mg via INTRAVENOUS

## 2016-07-19 NOTE — Anesthesia Procedure Notes (Signed)
Date/Time: 07/19/2016 10:17 AM Performed by: Dionne Bucy Pre-anesthesia Checklist: Patient identified, Emergency Drugs available, Suction available and Patient being monitored Patient Re-evaluated:Patient Re-evaluated prior to inductionOxygen Delivery Method: Circle system utilized Preoxygenation: Pre-oxygenation with 100% oxygen Intubation Type: IV induction Ventilation: Mask ventilation without difficulty and Mask ventilation throughout procedure Airway Equipment and Method: Bite block Placement Confirmation: positive ETCO2 Dental Injury: Teeth and Oropharynx as per pre-operative assessment

## 2016-07-19 NOTE — Procedures (Signed)
ECT SERVICES Physician's Interval Evaluation & Treatment Note  Patient Identification: Melinda [name] MRN:  [id number] Date of Evaluation:  07/19/2016 TX #: [id number]   MADRS:   MMSE:   P.E. Findings:  No change   Psychiatric Interval Note:  Still down. Not suicidal  Subjective:  Patient is a 45 y.o. female seen for evaluation for Electroconvulsive Therapy. Mood flat  Treatment Summary:   []   Right Unilateral             [x]  Bilateral   % Energy : 38ms, 35%    Impedance: 1460 ohms  Seizure Energy Index: 5908 microvolts squared  Postictal Suppression Index: 88%  Seizure Concordance Index: 96%  Medications  Pre Shock: robinal 04.mg, toradol 30mg , labetalol 20mg , esmolol 10mg , brevital 70mg , succinylcholine 100mg   Post Shock:    Seizure Duration: 41 sec emg, 52sec eeg   Comments: Follow up friday   Lungs:  [x]   Clear to auscultation               []  Other:   Heart:    [x]   Regular rhythm             []  irregular rhythm    [x]   Previous H&P reviewed, patient examined and there are NO CHANGES                 []   Previous H&P reviewed, patient examined and there are changes noted.   [name], MD 9/8/201711:33 AM

## 2016-07-19 NOTE — Anesthesia Preprocedure Evaluation (Signed)
Anesthesia Evaluation  Patient identified by MRN, date of birth, ID band Patient awake    Reviewed: Allergy & Precautions, H&P , NPO status , Patient's Chart, lab work & pertinent test results  History of Anesthesia Complications Negative for: history of anesthetic complications  Airway Mallampati: II   Neck ROM: full    Dental  (+) Poor Dentition, Chipped   Pulmonary sleep apnea , neg COPD,    Pulmonary exam normal        Cardiovascular hypertension, Pt. on medications (-) CAD and (-) Past MI negative cardio ROS Normal cardiovascular exam Rate:Normal     Neuro/Psych Depression Bipolar Disorder negative neurological ROS     GI/Hepatic negative GI ROS, Neg liver ROS, GERD  Medicated,  Endo/Other  diabetes, Type 2, Oral Hypoglycemic Agents  Renal/GU negative Renal ROS  negative genitourinary   Musculoskeletal   Abdominal (+) + obese,   Peds  Hematology negative hematology ROS (+)   Anesthesia Other Findings Past Medical History: No date: Depression No date: Diabetes mellitus without complication ([place]) 99991111: Diabetic peripheral neuropathy ([place]) 03/07/15: Diabetic peripheral neuropathy ([place]) 03/07/15: Diabetic peripheral neuropathy ([facility]) No date: GERD (gastroesophageal reflux disease) 03/07/15: Hypercholesterolemia No date: Hypertension 03/07/15: Obesity 03/07/15: Personality disorder 03/07/15: Sinus tachycardia ([place])     Comment: history of 03/07/15: Suicidal thoughts Past Surgical History: 03/07/15: electroconvulsion therapy BMI    Body Mass Index:  36.44 kg/m     Reproductive/Obstetrics                             Anesthesia Physical  Anesthesia Plan  ASA: III  Anesthesia Plan: General   Post-op Pain Management:    Induction: Intravenous  Airway Management Planned: Mask  Additional Equipment:   Intra-op Plan:   Post-operative Plan:   Informed Consent: I have  reviewed the patients History and Physical, chart, labs and discussed the procedure including the risks, benefits and alternatives for the proposed anesthesia with the patient or authorized representative who has indicated his/her understanding and acceptance.   Dental Advisory Given  Plan Discussed with: CRNA and Anesthesiologist  Anesthesia Plan Comments:         Anesthesia Quick Evaluation

## 2016-07-19 NOTE — H&P (Signed)
Melinda [name] is an 45 y.o. female.   Chief Complaint: No specific new complaint. Mood slightly better. HPI: History of recurrent severe depression. Maintenance treatment schedule as usual once a week.  Past Medical History:  Diagnosis Date  . Depression   . Diabetes mellitus without complication ([place])   . Diabetic peripheral neuropathy ([place]) 03/07/15  . Diabetic peripheral neuropathy ([place]) 03/07/15  . Diabetic peripheral neuropathy ([place]) 03/07/15  . GERD (gastroesophageal reflux disease)   . Hypercholesterolemia 03/07/15  . Hypertension   . Obesity 03/07/15  . Personality disorder 03/07/15  . Sinus tachycardia ([place]) 03/07/15   history of  . Suicidal thoughts 03/07/15    Past Surgical History:  Procedure Laterality Date  . electroconvulsion therapy  03/07/15    Family History  Problem Relation Age of Onset  . Hypertension Father   . Diabetes Mother    Social History:  reports that she has never smoked. She has never used smokeless tobacco. She reports that she does not drink alcohol or use drugs.  Allergies:  Allergies  Allergen Reactions  . Prednisone     Increases blood sugar     (Not in a hospital admission)  Results for orders placed or performed during the hospital encounter of 07/19/16 (from the past 48 hour(s))  Pregnancy, urine POC     Status: None   Collection Time: 07/19/16  8:25 AM  Result Value Ref Range   Preg Test, Ur NEGATIVE NEGATIVE    Comment:        THE SENSITIVITY OF THIS METHODOLOGY IS >24 mIU/mL   Glucose, capillary     Status: None   Collection Time: 07/19/16  8:46 AM  Result Value Ref Range   Glucose-Capillary 87 65 - 99 mg/dL   Comment 1 Notify RN   Glucose, capillary     Status: Abnormal   Collection Time: 07/19/16 10:30 AM  Result Value Ref Range   Glucose-Capillary 122 (H) 65 - 99 mg/dL   No results found.  Review of Systems  Constitutional: Negative.   HENT: Negative.   Eyes: Negative.   Respiratory: Negative.    Cardiovascular: Negative.   Gastrointestinal: Negative.   Musculoskeletal: Negative.   Skin: Negative.   Neurological: Negative.   Psychiatric/Behavioral: Positive for depression. Negative for hallucinations, memory loss, substance abuse and suicidal ideas. The patient is nervous/anxious. The patient does not have insomnia.     Blood pressure (!) 103/52, pulse 96, temperature 98.7 F (37.1 C), resp. rate 20, weight 97.1 kg (214 lb), last menstrual period 07/10/2016, SpO2 99 %. Physical Exam  Nursing note and vitals reviewed. Constitutional: She appears well-developed and well-nourished.  HENT:  Head: Normocephalic and atraumatic.  Eyes: Conjunctivae are normal. Pupils are equal, round, and reactive to light.  Neck: Normal range of motion.  Cardiovascular: Regular rhythm and normal heart sounds.   Respiratory: Effort normal. No respiratory distress.  GI: Soft.  Musculoskeletal: Normal range of motion.  Neurological: She is alert.  Skin: Skin is warm and dry.  Psychiatric: She has a normal mood and affect. Her speech is normal and behavior is normal. Judgment normal. Her mood appears not anxious. She is not slowed. Thought content is not paranoid. Cognition and memory are normal. She expresses no suicidal ideation.     Assessment/Plan Follow-up Friday  [name], MD 07/19/2016, 10:50 AM

## 2016-07-19 NOTE — Transfer of Care (Addendum)
Immediate Anesthesia Transfer of Care Note  Patient: Melinda [name]  Procedure(s) Performed: ECT  Patient Location: PACU  Anesthesia Type:General  Level of Consciousness: sedated  Airway & Oxygen Therapy: Patient Spontanous Breathing and Patient connected to face mask oxygen  Post-op Assessment: Report given to RN and Post -op Vital signs reviewed and stable  Post vital signs: Reviewed and stable  Last Vitals:  Vitals:   07/19/16 0841 07/19/16 1030  BP: 129/88 121/60  Pulse: 86 100  Resp: 16 19  Temp: 36.8 C 37.1 C    Last Pain:  Vitals:   07/19/16 0841  TempSrc: Oral         Complications: No apparent anesthesia complications

## 2016-07-19 NOTE — Discharge Instructions (Signed)
1)  The drugs that you have been given will stay in your system until tomorrow so for the       next 24 hours you should not:  A. Drive an automobile  B. Make any legal decisions  C. Drink any alcoholic beverages  2)  You may resume your regular meals upon return home.  3)  A responsible adult must take you home.  Someone should stay with you for a few          hours, then be available by phone for the remainder of the treatment day.  4)  You May experience any of the following symptoms:  Headache, Nausea and a dry mouth (due to the medications you were given),  temporary memory loss and some confusion, or sore muscles (a warm bath  should help this).  If you you experience any of these symptoms let us know on                your return visit.  5)  Report any of the following: any acute discomfort, severe headache, or temperature        greater than 100.5 F.   Also report any unusual redness, swelling, drainage, or pain         at your IV site.    You may report Symptoms to:  [place] at [facility]          Phone: [phone], ECT Department           or Dr. Prescott Gum office [phone]  6)  Your next ECT Treatment is Day Friday  Date September 15  We will call 2 days prior to your scheduled appointment for arrival times.  7)  Nothing to eat or drink after midnight the night before your procedure.  8)  Take Lisinopril     With a sip of water the morning of your procedure.  9)  Other Instructions: Call [phone] to cancel the morning of your procedure due         to illness or emergency.  10) We will call within 72 hours to assess how you are feeling.    CIRUGIA AMBULATORIA       Instruccionnes de alta    Date Toma Copier.   1.  Las drogas que se Statistician en su cuerpo [employer], asi      que por las proximas 24 horas usted no debe:   Conducir Scientist, research (medical)) un automovil   Hacer ninguna decision legal   Tomar ninguna bebida alcoholica  2.  A) [place]  puede comenzar una dieta regular.  Es mejor que hoy empiece con                    liquidos y gradualmente anada comidas solidas.       B) Puede comer cualquier comida que desee pero es mejor empezar con liquidos,               luego sopitas con galletas saladas y gradualmente llegar a las comidas solidas.  3.  Por favor avise a su medico inmediatamente si usted tiene algun sangrado anormal,       tiene dificultad con la respiracion, enrojecimiento y Social research officer, government en el sitio de la cirugia,     [place], fiebro o dolor que se alivia con [place].  4.  A) Su visita posoperatoria (despues de su operacion) es con el  Dr. .  Date .  Time .        B)  Por favor llame para hacer la cita posoperatoria.  5.  Istrucciones especificas :

## 2016-07-19 NOTE — Anesthesia Postprocedure Evaluation (Signed)
Anesthesia Post Note  Patient: Melinda [name]  Procedure(s) Performed: * No procedures listed *  Patient location during evaluation: PACU Anesthesia Type: General Level of consciousness: awake and alert Pain management: pain level controlled Vital Signs Assessment: post-procedure vital signs reviewed and stable Respiratory status: spontaneous breathing, nonlabored ventilation, respiratory function stable and patient connected to nasal cannula oxygen Cardiovascular status: blood pressure returned to baseline and stable Postop Assessment: no signs of nausea or vomiting Anesthetic complications: no    Last Vitals:  Vitals:   07/19/16 1056 07/19/16 1104  BP: 96/65 (!) 115/57  Pulse: 94 88  Resp: (!) 24 18  Temp: 36.9 C     Last Pain:  Vitals:   07/19/16 0841  TempSrc: Oral                 Martha Clan

## 2016-07-19 NOTE — Progress Notes (Signed)
I have put in a 90 day supply of this medicine because she ran out of it and has been out for several weeks now. She claims that she has been trying to get in touch with Dr. Rosine Door without success.

## 2016-07-25 ENCOUNTER — Other Ambulatory Visit: Payer: Self-pay | Admitting: Psychiatry

## 2016-07-26 ENCOUNTER — Encounter (HOSPITAL_BASED_OUTPATIENT_CLINIC_OR_DEPARTMENT_OTHER)
Admission: RE | Admit: 2016-07-26 | Discharge: 2016-07-26 | Disposition: A | Discharge status: Home / Self Care | Payer: Medicare PPO | Source: Ambulatory Visit | Attending: Psychiatry | Admitting: Psychiatry

## 2016-07-26 ENCOUNTER — Encounter: Payer: Self-pay | Admitting: Anesthesiology

## 2016-07-26 ENCOUNTER — Other Ambulatory Visit: Payer: Self-pay | Admitting: Psychiatry

## 2016-07-26 DIAGNOSIS — F332 Major depressive disorder, recurrent severe without psychotic features: Secondary | ICD-10-CM

## 2016-07-26 DIAGNOSIS — F329 Major depressive disorder, single episode, unspecified: Secondary | ICD-10-CM | POA: Diagnosis not present

## 2016-07-26 DIAGNOSIS — F319 Bipolar disorder, unspecified: Secondary | ICD-10-CM | POA: Diagnosis not present

## 2016-07-26 DIAGNOSIS — E119 Type 2 diabetes mellitus without complications: Secondary | ICD-10-CM | POA: Diagnosis not present

## 2016-07-26 DIAGNOSIS — I10 Essential (primary) hypertension: Secondary | ICD-10-CM | POA: Diagnosis not present

## 2016-07-26 LAB — GLUCOSE, CAPILLARY: Glucose-Capillary: 93 mg/dL (ref 65–99)

## 2016-07-26 LAB — POCT PREGNANCY, URINE: Preg Test, Ur: NEGATIVE

## 2016-07-26 MED ORDER — SUCCINYLCHOLINE CHLORIDE 20 MG/ML IJ SOLN
INTRAMUSCULAR | Status: DC | PRN
  Administered 2016-07-26: 100 mg via INTRAVENOUS

## 2016-07-26 MED ORDER — SODIUM CHLORIDE 0.9 % IV SOLN
500.0000 mL | Freq: Once | INTRAVENOUS | Status: AC
  Administered 2016-07-26: 500 mL via INTRAVENOUS

## 2016-07-26 MED ORDER — SODIUM CHLORIDE 0.9 % IV SOLN
INTRAVENOUS | Status: DC | PRN
Start: 2016-07-26 — End: 2016-07-26
  Administered 2016-07-26: 10:00:00 via INTRAVENOUS

## 2016-07-26 MED ORDER — KETOROLAC TROMETHAMINE 30 MG/ML IJ SOLN
30.0000 mg | Freq: Once | INTRAMUSCULAR | Status: AC
  Administered 2016-07-26: 30 mg via INTRAVENOUS

## 2016-07-26 MED ORDER — METHOHEXITAL SODIUM 100 MG/10ML IV SOSY
PREFILLED_SYRINGE | INTRAVENOUS | Status: DC | PRN
  Administered 2016-07-26: 70 mg via INTRAVENOUS

## 2016-07-26 MED ORDER — GLYCOPYRROLATE 0.2 MG/ML IJ SOLN
0.4000 mg | Freq: Once | INTRAMUSCULAR | Status: AC
  Administered 2016-07-26: 0.4 mg via INTRAVENOUS

## 2016-07-26 MED ORDER — KETOROLAC TROMETHAMINE 30 MG/ML IJ SOLN
INTRAMUSCULAR | Status: AC
  Administered 2016-07-26: 30 mg via INTRAVENOUS
  Filled 2016-07-26: qty 1

## 2016-07-26 MED ORDER — LABETALOL HCL 5 MG/ML IV SOLN
INTRAVENOUS | Status: DC | PRN
  Administered 2016-07-26: 20 mg via INTRAVENOUS

## 2016-07-26 MED ORDER — GLYCOPYRROLATE 0.2 MG/ML IJ SOLN
INTRAMUSCULAR | Status: AC
  Administered 2016-07-26: 0.4 mg via INTRAVENOUS
  Filled 2016-07-26: qty 2

## 2016-07-26 MED ORDER — ESMOLOL HCL 100 MG/10ML IV SOLN
INTRAVENOUS | Status: DC | PRN
  Administered 2016-07-26: 10 ug via INTRAVENOUS

## 2016-07-26 NOTE — H&P (Signed)
Melinda [name] is an 45 y.o. female.   Chief Complaint: Feeling a little bit more down. Not having active suicidal thoughts. No clear complaint. Physically doing well. HPI: Long-standing depression stable on maintenance ECT.  Past Medical History:  Diagnosis Date  . Depression   . Diabetes mellitus without complication ([place])   . Diabetic peripheral neuropathy ([place]) 03/07/15  . Diabetic peripheral neuropathy ([place]) 03/07/15  . Diabetic peripheral neuropathy ([place]) 03/07/15  . GERD (gastroesophageal reflux disease)   . Hypercholesterolemia 03/07/15  . Hypertension   . Obesity 03/07/15  . Personality disorder 03/07/15  . Sinus tachycardia ([place]) 03/07/15   history of  . Suicidal thoughts 03/07/15    Past Surgical History:  Procedure Laterality Date  . electroconvulsion therapy  03/07/15    Family History  Problem Relation Age of Onset  . Hypertension Father   . Diabetes Mother    Social History:  reports that she has never smoked. She has never used smokeless tobacco. She reports that she does not drink alcohol or use drugs.  Allergies:  Allergies  Allergen Reactions  . Prednisone     Increases blood sugar     (Not in a hospital admission)  Results for orders placed or performed during the hospital encounter of 07/26/16 (from the past 48 hour(s))  Pregnancy, urine POC     Status: None   Collection Time: 07/26/16  8:10 AM  Result Value Ref Range   Preg Test, Ur NEGATIVE NEGATIVE    Comment:        THE SENSITIVITY OF THIS METHODOLOGY IS >24 mIU/mL   Glucose, capillary     Status: None   Collection Time: 07/26/16  8:22 AM  Result Value Ref Range   Glucose-Capillary 93 65 - 99 mg/dL   Comment 1 Notify RN    No results found.  Review of Systems  Constitutional: Negative.   HENT: Negative.   Eyes: Negative.   Respiratory: Negative.   Cardiovascular: Negative.   Gastrointestinal: Negative.   Musculoskeletal: Negative.   Skin: Negative.   Neurological: Negative.    Psychiatric/Behavioral: Positive for depression. Negative for hallucinations, memory loss, substance abuse and suicidal ideas. The patient is nervous/anxious. The patient does not have insomnia.     Blood pressure 133/87, pulse 87, temperature (!) 96.9 F (36.1 C), temperature source Tympanic, resp. rate 18, weight 94.8 kg (209 lb), last menstrual period 07/10/2016, SpO2 100 %. Physical Exam  Nursing note and vitals reviewed. Constitutional: She appears well-developed and well-nourished.  HENT:  Head: Normocephalic and atraumatic.  Eyes: Conjunctivae are normal. Pupils are equal, round, and reactive to light.  Neck: Normal range of motion.  Cardiovascular: Regular rhythm and normal heart sounds.   Respiratory: Effort normal. No respiratory distress.  GI: Soft.  Musculoskeletal: Normal range of motion.  Neurological: She is alert.  Skin: Skin is warm and dry.  Psychiatric: Her speech is normal. Judgment and thought content normal. Her affect is blunt. She is withdrawn. Cognition and memory are normal. She exhibits a depressed mood.     Assessment/Plan Hasn't been in to see Dr. Rosine Door in a while. Frustrated that she has not gotten a telephone call back. Strongly encouraged her to keep trying to make sure she has an appointment scheduled. One week  [name], MD 07/26/2016, 10:09 AM

## 2016-07-26 NOTE — Anesthesia Procedure Notes (Addendum)
Date/Time: 07/26/2016 10:16 AM Performed by: Doreen Salvage Pre-anesthesia Checklist: Patient identified, Emergency Drugs available, Suction available and Patient being monitored Patient Re-evaluated:Patient Re-evaluated prior to inductionOxygen Delivery Method: Circle system utilized Preoxygenation: Pre-oxygenation with 100% oxygen Intubation Type: IV induction Ventilation: Mask ventilation without difficulty and Mask ventilation throughout procedure Airway Equipment and Method: Bite block Placement Confirmation: positive ETCO2 Dental Injury: Teeth and Oropharynx as per pre-operative assessment

## 2016-07-26 NOTE — Anesthesia Procedure Notes (Signed)
Date/Time: 07/26/2016 10:13 AM Performed by: Doreen Salvage Pre-anesthesia Checklist: Patient identified, Emergency Drugs available, Suction available and Patient being monitored Patient Re-evaluated:Patient Re-evaluated prior to inductionPreoxygenation: Pre-oxygenation with 100% oxygen Intubation Type: IV induction Ventilation: Mask ventilation without difficulty and Mask ventilation throughout procedure Airway Equipment and Method: Bite block Placement Confirmation: positive ETCO2 Dental Injury: Teeth and Oropharynx as per pre-operative assessment

## 2016-07-26 NOTE — Procedures (Signed)
ECT SERVICES Physician's Interval Evaluation & Treatment Note  Patient Identification: Melinda [name] MRN:  [id number] Date of Evaluation:  07/26/2016 TX #: [id number]  MADRS:   MMSE:   P.E. Findings:  No change to physical exam. Heart and lungs normal. Vital stable.  Psychiatric Interval Note:  Mood a little bit more down and frustrated not having active suicidal thoughts.  Subjective:  Patient is a 45 y.o. female seen for evaluation for Electroconvulsive Therapy. No specific new complaint.  Treatment Summary:   []   Right Unilateral             [x]  Bilateral   % Energy : 1.0 ms, 35%   Impedance: 1700 ohms  Seizure Energy Index: 1479 V squared  Postictal Suppression Index: 88%  Seizure Concordance Index: 91%  Medications  Pre Shock: Robinul 0.4 mg, labetalol 20 mg, esmolol 10 mg, Toradol 30 mg, Brevital 70 mg, succinylcholine 100 mg  Post Shock:    Seizure Duration: 29 seconds by EMG, 44 seconds by EEG   Comments: Doing well as usual. Patient has been very strongly advised to contact her outpatient psychiatrist to make sure she has a follow-up appointment. Follow-up next week   Lungs:  [x]   Clear to auscultation               []  Other:   Heart:    [x]   Regular rhythm             []  irregular rhythm    [x]   Previous H&P reviewed, patient examined and there are NO CHANGES                 []   Previous H&P reviewed, patient examined and there are changes noted.   [name], MD 9/15/201710:11 AM

## 2016-07-26 NOTE — Anesthesia Preprocedure Evaluation (Signed)
Anesthesia Evaluation  Patient identified by MRN, date of birth, ID band Patient awake    Reviewed: Allergy & Precautions, H&P , NPO status , Patient's Chart, lab work & pertinent test results  History of Anesthesia Complications Negative for: history of anesthetic complications  Airway Mallampati: II   Neck ROM: full    Dental  (+) Poor Dentition, Chipped   Pulmonary sleep apnea , neg COPD,    Pulmonary exam normal        Cardiovascular hypertension, Pt. on medications (-) CAD and (-) Past MI negative cardio ROS Normal cardiovascular exam Rate:Normal     Neuro/Psych Depression Bipolar Disorder negative neurological ROS     GI/Hepatic negative GI ROS, Neg liver ROS, GERD  Medicated,  Endo/Other  diabetes, Type 2, Oral Hypoglycemic Agents  Renal/GU negative Renal ROS  negative genitourinary   Musculoskeletal   Abdominal (+) + obese,   Peds  Hematology negative hematology ROS (+)   Anesthesia Other Findings Past Medical History: No date: Depression No date: Diabetes mellitus without complication ([place]) 99991111: Diabetic peripheral neuropathy ([place]) 03/07/15: Diabetic peripheral neuropathy ([place]) 03/07/15: Diabetic peripheral neuropathy ([facility]) No date: GERD (gastroesophageal reflux disease) 03/07/15: Hypercholesterolemia No date: Hypertension 03/07/15: Obesity 03/07/15: Personality disorder 03/07/15: Sinus tachycardia ([place])     Comment: history of 03/07/15: Suicidal thoughts Past Surgical History: 03/07/15: electroconvulsion therapy BMI    Body Mass Index:  36.44 kg/m     Reproductive/Obstetrics                             Anesthesia Physical  Anesthesia Plan  ASA: III  Anesthesia Plan: General   Post-op Pain Management:    Induction: Intravenous  Airway Management Planned: Mask  Additional Equipment:   Intra-op Plan:   Post-operative Plan:   Informed Consent: I have  reviewed the patients History and Physical, chart, labs and discussed the procedure including the risks, benefits and alternatives for the proposed anesthesia with the patient or authorized representative who has indicated his/her understanding and acceptance.   Dental Advisory Given  Plan Discussed with: CRNA and Anesthesiologist  Anesthesia Plan Comments:         Anesthesia Quick Evaluation

## 2016-07-26 NOTE — Discharge Instructions (Signed)
1)  The drugs that you have been given will stay in your system until tomorrow so for the       next 24 hours you should not:  A. Drive an automobile  B. Make any legal decisions  C. Drink any alcoholic beverages  2)  You may resume your regular meals upon return home.  3)  A responsible adult must take you home.  Someone should stay with you for a few          hours, then be available by phone for the remainder of the treatment day.  4)  You May experience any of the following symptoms:  Headache, Nausea and a dry mouth (due to the medications you were given),  temporary memory loss and some confusion, or sore muscles (a warm bath  should help this).  If you you experience any of these symptoms let us know on                your return visit.  5)  Report any of the following: any acute discomfort, severe headache, or temperature        greater than 100.5 F.   Also report any unusual redness, swelling, drainage, or pain         at your IV site.    You may report Symptoms to:  [place] at [facility]          Phone: [phone], ECT Department           or Dr. Prescott Gum office [phone]  6)  Your next ECT Treatment is Day  Friday  Date  September 22  We will call 2 days prior to your scheduled appointment for arrival times.  7)  Nothing to eat or drink after midnight the night before your procedure.  8)  Take  Lisinopril    With a sip of water the morning of your procedure.  9)  Other Instructions: Call [phone] to cancel the morning of your procedure due         to illness or emergency.  10) We will call within 72 hours to assess how you are feeling.

## 2016-07-26 NOTE — Transfer of Care (Signed)
Immediate Anesthesia Transfer of Care Note  Patient: Melinda [name]  Procedure(s) Performed: * No procedures listed *  Patient Location: PACU  Anesthesia Type:General  Level of Consciousness: sedated  Airway & Oxygen Therapy: Patient Spontanous Breathing and Patient connected to face mask oxygen  Post-op Assessment: Report given to RN and Post -op Vital signs reviewed and stable  Post vital signs: Reviewed and stable  Last Vitals:  Vitals:   07/26/16 0810 07/26/16 1028  BP: 133/87 (!) 91/49  Pulse: 87 99  Resp: 18 18  Temp: (!) 36.1 C 123456 C    Complications: No apparent anesthesia complications

## 2016-07-26 NOTE — Anesthesia Postprocedure Evaluation (Signed)
Anesthesia Post Note  Patient: Melinda [name]  Procedure(s) Performed: * No procedures listed *  Patient location during evaluation: PACU Anesthesia Type: General Level of consciousness: awake and alert Pain management: pain level controlled Vital Signs Assessment: post-procedure vital signs reviewed and stable Respiratory status: spontaneous breathing, nonlabored ventilation, respiratory function stable and patient connected to nasal cannula oxygen Cardiovascular status: blood pressure returned to baseline and stable Postop Assessment: no signs of nausea or vomiting Anesthetic complications: no    Last Vitals:  Vitals:   07/26/16 1059 07/26/16 1103  BP:  103/62  Pulse: 86   Resp: 18   Temp:      Last Pain:  Vitals:   07/26/16 1047  TempSrc:   PainSc: 0-No pain                 Martha Clan

## 2016-08-01 ENCOUNTER — Other Ambulatory Visit: Payer: Self-pay | Admitting: Psychiatry

## 2016-08-02 ENCOUNTER — Encounter (HOSPITAL_BASED_OUTPATIENT_CLINIC_OR_DEPARTMENT_OTHER)
Admission: RE | Admit: 2016-08-02 | Discharge: 2016-08-02 | Disposition: A | Discharge status: Home / Self Care | Payer: Medicare PPO | Source: Ambulatory Visit | Attending: Psychiatry | Admitting: Psychiatry

## 2016-08-02 ENCOUNTER — Encounter: Payer: Self-pay | Admitting: Anesthesiology

## 2016-08-02 DIAGNOSIS — F329 Major depressive disorder, single episode, unspecified: Secondary | ICD-10-CM | POA: Diagnosis not present

## 2016-08-02 DIAGNOSIS — F332 Major depressive disorder, recurrent severe without psychotic features: Secondary | ICD-10-CM

## 2016-08-02 DIAGNOSIS — F3189 Other bipolar disorder: Secondary | ICD-10-CM | POA: Diagnosis not present

## 2016-08-02 DIAGNOSIS — E119 Type 2 diabetes mellitus without complications: Secondary | ICD-10-CM | POA: Diagnosis not present

## 2016-08-02 LAB — POCT PREGNANCY, URINE: Preg Test, Ur: NEGATIVE

## 2016-08-02 LAB — GLUCOSE, CAPILLARY: Glucose-Capillary: 86 mg/dL (ref 65–99)

## 2016-08-02 MED ORDER — METHOHEXITAL SODIUM 100 MG/10ML IV SOSY
PREFILLED_SYRINGE | INTRAVENOUS | Status: DC | PRN
  Administered 2016-08-02: 70 mg via INTRAVENOUS

## 2016-08-02 MED ORDER — GLYCOPYRROLATE 0.2 MG/ML IJ SOLN
INTRAMUSCULAR | Status: AC
  Filled 2016-08-02: qty 1

## 2016-08-02 MED ORDER — ESMOLOL HCL 100 MG/10ML IV SOLN
INTRAVENOUS | Status: DC | PRN
  Administered 2016-08-02: 10 mg via INTRAVENOUS

## 2016-08-02 MED ORDER — KETOROLAC TROMETHAMINE 30 MG/ML IJ SOLN
30.0000 mg | Freq: Once | INTRAMUSCULAR | Status: AC
  Administered 2016-08-02: 30 mg via INTRAVENOUS

## 2016-08-02 MED ORDER — SUCCINYLCHOLINE CHLORIDE 200 MG/10ML IV SOSY: PREFILLED_SYRINGE | INTRAVENOUS | Status: DC | PRN

## 2016-08-02 MED ORDER — LABETALOL HCL 5 MG/ML IV SOLN
INTRAVENOUS | Status: DC | PRN
  Administered 2016-08-02: 20 mg via INTRAVENOUS

## 2016-08-02 MED ORDER — SODIUM CHLORIDE 0.9 % IV SOLN
INTRAVENOUS | Status: DC | PRN
  Administered 2016-08-02: 10:00:00 via INTRAVENOUS

## 2016-08-02 MED ORDER — SODIUM CHLORIDE 0.9 % IV SOLN
500.0000 mL | Freq: Once | INTRAVENOUS | Status: AC
  Administered 2016-08-02: 500 mL via INTRAVENOUS

## 2016-08-02 MED ORDER — SUCCINYLCHOLINE CHLORIDE 200 MG/10ML IV SOSY
PREFILLED_SYRINGE | INTRAVENOUS | Status: DC | PRN
  Administered 2016-08-02: 100 mg via INTRAVENOUS

## 2016-08-02 MED ORDER — GLYCOPYRROLATE 0.2 MG/ML IJ SOLN
0.4000 mg | Freq: Once | INTRAMUSCULAR | Status: AC
  Administered 2016-08-02: 0.4 mg via INTRAVENOUS

## 2016-08-02 MED ORDER — KETOROLAC TROMETHAMINE 30 MG/ML IJ SOLN
INTRAMUSCULAR | Status: AC
  Administered 2016-08-02: 30 mg via INTRAVENOUS
  Filled 2016-08-02: qty 1

## 2016-08-02 MED ORDER — GLYCOPYRROLATE 0.2 MG/ML IJ SOLN
INTRAMUSCULAR | Status: AC
Start: 2016-08-02 — End: 2016-08-02
  Administered 2016-08-02: 0.4 mg via INTRAVENOUS
  Filled 2016-08-02: qty 1

## 2016-08-02 NOTE — Anesthesia Procedure Notes (Signed)
Date/Time: 08/02/2016 10:18 AM Performed by: Dionne Bucy Pre-anesthesia Checklist: Patient identified, Emergency Drugs available, Suction available and Patient being monitored Patient Re-evaluated:Patient Re-evaluated prior to inductionOxygen Delivery Method: Circle system utilized Preoxygenation: Pre-oxygenation with 100% oxygen Intubation Type: IV induction Ventilation: Mask ventilation without difficulty and Mask ventilation throughout procedure Airway Equipment and Method: Bite block Placement Confirmation: positive ETCO2 Dental Injury: Teeth and Oropharynx as per pre-operative assessment

## 2016-08-02 NOTE — Discharge Instructions (Signed)
1)  The drugs that you have been given will stay in your system until tomorrow so for the       next 24 hours you should not:  A. Drive an automobile  B. Make any legal decisions  C. Drink any alcoholic beverages  2)  You may resume your regular meals upon return home.  3)  A responsible adult must take you home.  Someone should stay with you for a few          hours, then be available by phone for the remainder of the treatment day.  4)  You May experience any of the following symptoms:  Headache, Nausea and a dry mouth (due to the medications you were given),  temporary memory loss and some confusion, or sore muscles (a warm bath  should help this).  If you you experience any of these symptoms let us know on                your return visit.  5)  Report any of the following: any acute discomfort, severe headache, or temperature        greater than 100.5 F.   Also report any unusual redness, swelling, drainage, or pain         at your IV site.    You may report Symptoms to:  [place] at [facility]          Phone: [phone], ECT Department           or Dr. Prescott Gum office [phone]  6)  Your next ECT Treatment is Day Friday  Date September 29  We will call 2 days prior to your scheduled appointment for arrival times.  7)  Nothing to eat or drink after midnight the night before your procedure.  8)  Take Lisinopril     With a sip of water the morning of your procedure.  9)  Other Instructions: Call [phone] to cancel the morning of your procedure due         to illness or emergency.  10) We will call within 72 hours to assess how you are feeling.

## 2016-08-02 NOTE — Anesthesia Postprocedure Evaluation (Signed)
Anesthesia Post Note  Patient: Melinda [name]  Procedure(s) Performed: * No procedures listed *  Patient location during evaluation: PACU Anesthesia Type: General Level of consciousness: awake and alert Pain management: pain level controlled Vital Signs Assessment: post-procedure vital signs reviewed and stable Respiratory status: spontaneous breathing and respiratory function stable Cardiovascular status: stable Anesthetic complications: no    Last Vitals:  Vitals:   08/02/16 1106 08/02/16 1112  BP:  103/74  Pulse: 77   Resp: 18   Temp:      Last Pain:  Vitals:   08/02/16 1048  TempSrc:   PainSc: 0-No pain                 Deshun Sedivy K

## 2016-08-02 NOTE — Transfer of Care (Signed)
Immediate Anesthesia Transfer of Care Note  Patient: Melinda [name]  Procedure(s) Performed: ECT  Patient Location: PACU  Anesthesia Type:General  Level of Consciousness: sedated  Airway & Oxygen Therapy: Patient Spontanous Breathing and Patient connected to face mask oxygen  Post-op Assessment: Report given to RN and Post -op Vital signs reviewed and stable  Post vital signs: Reviewed and stable  Last Vitals:  Vitals:   08/02/16 0818 08/02/16 1028  BP: 140/81 133/62  Pulse: 79 89  Resp: 18 13  Temp: 36.8 C 37 C    Last Pain:  Vitals:   08/02/16 0818  TempSrc: Oral         Complications: No apparent anesthesia complications

## 2016-08-02 NOTE — H&P (Signed)
Melinda [name] is an 45 y.o. female.   Chief Complaint: Patient says she is feeling pretty stable. Affect slightly dysphoric. No worse than usual. No new complaint. No suicidal thinking. HPI: Chronic depression which stays stable with current maintenance ECT schedule and medication.  Past Medical History:  Diagnosis Date  . Depression   . Diabetes mellitus without complication ([place])   . Diabetic peripheral neuropathy ([place]) 03/07/15  . Diabetic peripheral neuropathy ([place]) 03/07/15  . Diabetic peripheral neuropathy ([place]) 03/07/15  . GERD (gastroesophageal reflux disease)   . Hypercholesterolemia 03/07/15  . Hypertension   . Obesity 03/07/15  . Personality disorder 03/07/15  . Sinus tachycardia ([place]) 03/07/15   history of  . Suicidal thoughts 03/07/15    Past Surgical History:  Procedure Laterality Date  . electroconvulsion therapy  03/07/15    Family History  Problem Relation Age of Onset  . Hypertension Father   . Diabetes Mother    Social History:  reports that she has never smoked. She has never used smokeless tobacco. She reports that she does not drink alcohol or use drugs.  Allergies:  Allergies  Allergen Reactions  . Prednisone     Increases blood sugar     (Not in a hospital admission)  Results for orders placed or performed during the hospital encounter of 08/02/16 (from the past 48 hour(s))  Pregnancy, urine POC     Status: None   Collection Time: 08/02/16  8:20 AM  Result Value Ref Range   Preg Test, Ur NEGATIVE NEGATIVE    Comment:        THE SENSITIVITY OF THIS METHODOLOGY IS >24 mIU/mL   Glucose, capillary     Status: None   Collection Time: 08/02/16  8:24 AM  Result Value Ref Range   Glucose-Capillary 86 65 - 99 mg/dL   No results found.  Review of Systems  Constitutional: Negative.   HENT: Negative.   Eyes: Negative.   Respiratory: Negative.   Cardiovascular: Negative.   Gastrointestinal: Negative.   Musculoskeletal: Negative.   Skin:  Negative.   Neurological: Negative.   Psychiatric/Behavioral: Positive for depression. Negative for hallucinations, memory loss, substance abuse and suicidal ideas. The patient is not nervous/anxious and does not have insomnia.     Blood pressure 140/81, pulse 79, temperature 98.2 F (36.8 C), temperature source Oral, resp. rate 18, height 5\' 4"  (1.626 m), weight 93.4 kg (206 lb), last menstrual period 07/10/2016, SpO2 98 %. Physical Exam  Nursing note and vitals reviewed. Constitutional: She appears well-developed and well-nourished.  HENT:  Head: Normocephalic and atraumatic.  Eyes: Conjunctivae are normal. Pupils are equal, round, and reactive to light.  Neck: Normal range of motion.  Cardiovascular: Regular rhythm and normal heart sounds.   Respiratory: Effort normal and breath sounds normal.  GI: Soft.  Musculoskeletal: Normal range of motion.  Neurological: She is alert.  Skin: Skin is warm and dry.  Psychiatric: Judgment and thought content normal. Her speech is delayed. She is slowed. Cognition and memory are normal. She exhibits a depressed mood. She expresses no suicidal ideation.     Assessment/Plan Maintenance ECT treatment today and followed by weekly schedule as usual.  [name], MD 08/02/2016, 10:09 AM

## 2016-08-02 NOTE — Anesthesia Preprocedure Evaluation (Signed)
Anesthesia Evaluation  Patient identified by MRN, date of birth, ID band Patient awake    Reviewed: Allergy & Precautions, H&P , NPO status , Patient's Chart, lab work & pertinent test results  History of Anesthesia Complications Negative for: history of anesthetic complications  Airway Mallampati: II   Neck ROM: full    Dental  (+) Poor Dentition, Chipped   Pulmonary sleep apnea , neg COPD,    Pulmonary exam normal        Cardiovascular hypertension, Pt. on medications (-) CAD and (-) Past MI negative cardio ROS Normal cardiovascular exam     Neuro/Psych Depression Bipolar Disorder negative neurological ROS     GI/Hepatic negative GI ROS, Neg liver ROS, GERD  Medicated,  Endo/Other  diabetes, Type 2, Oral Hypoglycemic Agents  Renal/GU negative Renal ROS  negative genitourinary   Musculoskeletal   Abdominal (+) + obese,   Peds  Hematology negative hematology ROS (+)   Anesthesia Other Findings Past Medical History: No date: Depression No date: Diabetes mellitus without complication ([place]) 99991111: Diabetic peripheral neuropathy ([place]) 03/07/15: Diabetic peripheral neuropathy ([place]) 03/07/15: Diabetic peripheral neuropathy (HCC) No date: GERD (gastroesophageal reflux disease) 03/07/15: Hypercholesterolemia No date: Hypertension 03/07/15: Obesity 03/07/15: Personality disorder 03/07/15: Sinus tachycardia ([place])     Comment: history of 03/07/15: Suicidal thoughts Past Surgical History: 03/07/15: electroconvulsion therapy BMI    Body Mass Index:  36.44 kg/m     Reproductive/Obstetrics                             Anesthesia Physical  Anesthesia Plan  ASA: III  Anesthesia Plan: General   Post-op Pain Management:    Induction: Intravenous  Airway Management Planned: Mask  Additional Equipment:   Intra-op Plan:   Post-operative Plan:   Informed Consent: I have reviewed the  patients History and Physical, chart, labs and discussed the procedure including the risks, benefits and alternatives for the proposed anesthesia with the patient or authorized representative who has indicated his/her understanding and acceptance.   Dental Advisory Given  Plan Discussed with: CRNA and Anesthesiologist  Anesthesia Plan Comments:         Anesthesia Quick Evaluation

## 2016-08-02 NOTE — Procedures (Signed)
ECT SERVICES Physician's Interval Evaluation & Treatment Note  Patient Identification: Melinda [name] MRN:  [id number] Date of Evaluation:  08/02/2016 TX #: [id number]  MADRS:   MMSE:   P.E. Findings:  No change to physical exam. Vitals stable. Heart and lungs normal. Blunted affect mood not different no new symptoms.  Psychiatric Interval Note:  No specific new complaint.  Subjective:  Patient is a 45 y.o. female seen for evaluation for Electroconvulsive Therapy. No specific new complaint  Treatment Summary:   []   Right Unilateral             [x]  Bilateral   % Energy : 1.0 ms 35%   Impedance: 1150 ohms  Seizure Energy Index: 2771 V squared  Postictal Suppression Index: 95%  Seizure Concordance Index: 85%  Medications  Pre Shock: Robinul 0.4 mg, labetalol 20 mg, esmolol 10 mg, Toradol 30 mg, Brevital 70 mg, succinylcholine 100 mg  Post Shock:    Seizure Duration: 37 seconds by EMG, 44 seconds by EEG   Comments: Follow-up one week   Lungs:  [x]   Clear to auscultation               []  Other:   Heart:    [x]   Regular rhythm             []  irregular rhythm    [x]   Previous H&P reviewed, patient examined and there are NO CHANGES                 []   Previous H&P reviewed, patient examined and there are changes noted.   [name], MD 9/22/201710:12 AM

## 2016-08-08 ENCOUNTER — Other Ambulatory Visit: Payer: Self-pay | Admitting: Psychiatry

## 2016-08-09 ENCOUNTER — Encounter
Admission: RE | Admit: 2016-08-09 | Discharge: 2016-08-09 | Disposition: A | Discharge status: Home / Self Care | Payer: Medicare PPO | Source: Ambulatory Visit | Attending: Psychiatry | Admitting: Psychiatry

## 2016-08-09 ENCOUNTER — Encounter: Payer: Self-pay | Admitting: Anesthesiology

## 2016-08-09 DIAGNOSIS — F909 Attention-deficit hyperactivity disorder, unspecified type: Secondary | ICD-10-CM | POA: Diagnosis not present

## 2016-08-09 DIAGNOSIS — F332 Major depressive disorder, recurrent severe without psychotic features: Secondary | ICD-10-CM | POA: Diagnosis not present

## 2016-08-09 DIAGNOSIS — F329 Major depressive disorder, single episode, unspecified: Secondary | ICD-10-CM | POA: Diagnosis not present

## 2016-08-09 DIAGNOSIS — I10 Essential (primary) hypertension: Secondary | ICD-10-CM | POA: Insufficient documentation

## 2016-08-09 DIAGNOSIS — F339 Major depressive disorder, recurrent, unspecified: Secondary | ICD-10-CM | POA: Diagnosis not present

## 2016-08-09 DIAGNOSIS — F4310 Post-traumatic stress disorder, unspecified: Secondary | ICD-10-CM | POA: Diagnosis not present

## 2016-08-09 LAB — GLUCOSE, CAPILLARY: Glucose-Capillary: 99 mg/dL (ref 65–99)

## 2016-08-09 MED ORDER — ESMOLOL HCL 100 MG/10ML IV SOLN
INTRAVENOUS | Status: DC | PRN
  Administered 2016-08-09: 10 mg via INTRAVENOUS

## 2016-08-09 MED ORDER — KETOROLAC TROMETHAMINE 30 MG/ML IJ SOLN
30.0000 mg | Freq: Once | INTRAMUSCULAR | Status: AC
  Administered 2016-08-09: 30 mg via INTRAVENOUS

## 2016-08-09 MED ORDER — KETOROLAC TROMETHAMINE 30 MG/ML IJ SOLN
INTRAMUSCULAR | Status: AC
  Administered 2016-08-09: 30 mg via INTRAVENOUS
  Filled 2016-08-09: qty 1

## 2016-08-09 MED ORDER — SODIUM CHLORIDE 0.9 % IV SOLN
INTRAVENOUS | Status: DC | PRN
  Administered 2016-08-09: 10:00:00 via INTRAVENOUS

## 2016-08-09 MED ORDER — SODIUM CHLORIDE 0.9 % IV SOLN
500.0000 mL | Freq: Once | INTRAVENOUS | Status: AC
  Administered 2016-08-09: 500 mL via INTRAVENOUS

## 2016-08-09 MED ORDER — GLYCOPYRROLATE 0.2 MG/ML IJ SOLN
0.4000 mg | Freq: Once | INTRAMUSCULAR | Status: AC
  Administered 2016-08-09: 0.4 mg via INTRAVENOUS

## 2016-08-09 MED ORDER — GLYCOPYRROLATE 0.2 MG/ML IJ SOLN
INTRAMUSCULAR | Status: AC
  Administered 2016-08-09: 0.4 mg via INTRAVENOUS
  Filled 2016-08-09: qty 2

## 2016-08-09 MED ORDER — LABETALOL HCL 5 MG/ML IV SOLN
INTRAVENOUS | Status: DC | PRN
  Administered 2016-08-09: 20 mg via INTRAVENOUS

## 2016-08-09 MED ORDER — METHOHEXITAL SODIUM 100 MG/10ML IV SOSY
PREFILLED_SYRINGE | INTRAVENOUS | Status: DC | PRN
  Administered 2016-08-09: 70 mg via INTRAVENOUS

## 2016-08-09 MED ORDER — SUCCINYLCHOLINE CHLORIDE 200 MG/10ML IV SOSY
PREFILLED_SYRINGE | INTRAVENOUS | Status: DC | PRN
  Administered 2016-08-09: 100 mg via INTRAVENOUS

## 2016-08-09 NOTE — Anesthesia Preprocedure Evaluation (Signed)
Anesthesia Evaluation  Patient identified by MRN, date of birth, ID band Patient awake    Reviewed: Allergy & Precautions, H&P , NPO status , Patient's Chart, lab work & pertinent test results  History of Anesthesia Complications Negative for: history of anesthetic complications  Airway Mallampati: II   Neck ROM: full    Dental  (+) Poor Dentition, Chipped   Pulmonary sleep apnea , neg COPD,    Pulmonary exam normal        Cardiovascular hypertension, Pt. on medications (-) CAD and (-) Past MI negative cardio ROS Normal cardiovascular exam     Neuro/Psych negative neurological ROS     GI/Hepatic negative GI ROS, Neg liver ROS, GERD  Medicated,  Endo/Other  diabetes, Type 2, Oral Hypoglycemic Agents  Renal/GU negative Renal ROS  negative genitourinary   Musculoskeletal   Abdominal (+) + obese,   Peds  Hematology negative hematology ROS (+)   Anesthesia Other Findings Past Medical History: No date: Depression No date: Diabetes mellitus without complication ([place]) 99991111: Diabetic peripheral neuropathy ([place]) 03/07/15: Diabetic peripheral neuropathy ([place]) 03/07/15: Diabetic peripheral neuropathy (HCC) No date: GERD (gastroesophageal reflux disease) 03/07/15: Hypercholesterolemia No date: Hypertension 03/07/15: Obesity 03/07/15: Personality disorder 03/07/15: Sinus tachycardia ([place])     Comment: history of 03/07/15: Suicidal thoughts Past Surgical History: 03/07/15: electroconvulsion therapy BMI    Body Mass Index:  36.44 kg/m     Reproductive/Obstetrics                             Anesthesia Physical  Anesthesia Plan  ASA: III  Anesthesia Plan: General   Post-op Pain Management:    Induction: Intravenous  Airway Management Planned: Mask  Additional Equipment:   Intra-op Plan:   Post-operative Plan:   Informed Consent: I have reviewed the patients History and Physical,  chart, labs and discussed the procedure including the risks, benefits and alternatives for the proposed anesthesia with the patient or authorized representative who has indicated his/her understanding and acceptance.   Dental Advisory Given  Plan Discussed with: CRNA and Anesthesiologist  Anesthesia Plan Comments:         Anesthesia Quick Evaluation

## 2016-08-09 NOTE — Discharge Instructions (Signed)
1)  The drugs that you have been given will stay in your system until tomorrow so for the       next 24 hours you should not:  A. Drive an automobile  B. Make any legal decisions  C. Drink any alcoholic beverages  2)  You may resume your regular meals upon return home.  3)  A responsible adult must take you home.  Someone should stay with you for a few          hours, then be available by phone for the remainder of the treatment day.  4)  You May experience any of the following symptoms:  Headache, Nausea and a dry mouth (due to the medications you were given),  temporary memory loss and some confusion, or sore muscles (a warm bath  should help this).  If you you experience any of these symptoms let us know on                your return visit.  5)  Report any of the following: any acute discomfort, severe headache, or temperature        greater than 100.5 F.   Also report any unusual redness, swelling, drainage, or pain         at your IV site.    You may report Symptoms to:  [place] [facility]          Phone: [phone], ECT Department           or Dr. Prescott Gum office [phone]  6)  Your next ECT Treatment is Day Friday  Date October 6  We will call 2 days prior to your scheduled appointment for arrival times.  7)  Nothing to eat or drink after midnight the night before your procedure.  8)  Take Lisinopril     With a sip of water the morning of your procedure.  9)  Other Instructions: Call [phone] to cancel the morning of your procedure due         to illness or emergency.  10) We will call within 72 hours to assess how you are feeling.

## 2016-08-09 NOTE — H&P (Signed)
Melinda [name] is an 45 y.o. female.   Chief Complaint: Patient has no new specific complaint. Mood is stable. No physical complaint. HPI: Chronic recurrent severe depression well maintained on regular ECT.  Past Medical History:  Diagnosis Date  . Depression   . Diabetes mellitus without complication ([place])   . Diabetic peripheral neuropathy ([place]) 03/07/15  . Diabetic peripheral neuropathy ([place]) 03/07/15  . Diabetic peripheral neuropathy ([place]) 03/07/15  . GERD (gastroesophageal reflux disease)   . Hypercholesterolemia 03/07/15  . Hypertension   . Obesity 03/07/15  . Personality disorder 03/07/15  . Sinus tachycardia ([place]) 03/07/15   history of  . Suicidal thoughts 03/07/15    Past Surgical History:  Procedure Laterality Date  . electroconvulsion therapy  03/07/15    Family History  Problem Relation Age of Onset  . Hypertension Father   . Diabetes Mother    Social History:  reports that she has never smoked. She has never used smokeless tobacco. She reports that she does not drink alcohol or use drugs.  Allergies:  Allergies  Allergen Reactions  . Prednisone     Increases blood sugar     (Not in a hospital admission)  Results for orders placed or performed during the hospital encounter of 08/09/16 (from the past 48 hour(s))  Glucose, capillary     Status: None   Collection Time: 08/09/16  8:22 AM  Result Value Ref Range   Glucose-Capillary 99 65 - 99 mg/dL   No results found.  Review of Systems  Constitutional: Negative.   HENT: Negative.   Eyes: Negative.   Respiratory: Negative.   Cardiovascular: Negative.   Gastrointestinal: Negative.   Musculoskeletal: Negative.   Skin: Negative.   Neurological: Negative.   Psychiatric/Behavioral: Negative for depression, hallucinations, memory loss, substance abuse and suicidal ideas. The patient is not nervous/anxious and does not have insomnia.     Blood pressure (!) 143/77, pulse 75, temperature 98.2 F (36.8 C),  temperature source Oral, resp. rate 16, height 5\' 4"  (1.626 m), weight 97.5 kg (215 lb), last menstrual period 08/01/2016, SpO2 100 %. Physical Exam  Nursing note and vitals reviewed. Constitutional: She appears well-developed and well-nourished.  HENT:  Head: Normocephalic and atraumatic.  Eyes: Conjunctivae are normal. Pupils are equal, round, and reactive to light.  Neck: Normal range of motion.  Cardiovascular: Regular rhythm and normal heart sounds.   Respiratory: Effort normal. No respiratory distress.  GI: Soft.  Musculoskeletal: Normal range of motion.  Neurological: She is alert.  Skin: Skin is warm and dry.  Psychiatric: Thought content normal. Her affect is blunt. Her speech is delayed. She is slowed. Cognition and memory are normal.     Assessment/Plan Blood pressure is slightly up this morning but otherwise physical as normal nothing remarkable or different out of the ordinary. Usual treatment today follow-up one week.  [name], MD 08/09/2016, 10:10 AM

## 2016-08-09 NOTE — Anesthesia Postprocedure Evaluation (Signed)
Anesthesia Post Note  Patient: Melinda [name]  Procedure(s) Performed: * No procedures listed *  Patient location during evaluation: PACU Anesthesia Type: General Level of consciousness: awake and alert Pain management: pain level controlled Vital Signs Assessment: post-procedure vital signs reviewed and stable Respiratory status: spontaneous breathing, nonlabored ventilation, respiratory function stable and patient connected to nasal cannula oxygen Cardiovascular status: blood pressure returned to baseline and stable Postop Assessment: no signs of nausea or vomiting Anesthetic complications: no    Last Vitals:  Vitals:   08/09/16 1057 08/09/16 1112  BP: 118/71 136/86  Pulse: 84 80  Resp: 19 18  Temp: 36.8 C     Last Pain:  Vitals:   08/09/16 1112  TempSrc:   PainSc: 2                  Martha Clan

## 2016-08-09 NOTE — Anesthesia Procedure Notes (Signed)
Date/Time: 08/09/2016 10:19 AM Performed by: Dionne Bucy Pre-anesthesia Checklist: Patient identified, Emergency Drugs available, Suction available and Patient being monitored Patient Re-evaluated:Patient Re-evaluated prior to inductionOxygen Delivery Method: Circle system utilized Preoxygenation: Pre-oxygenation with 100% oxygen Intubation Type: IV induction Ventilation: Mask ventilation without difficulty and Mask ventilation throughout procedure Airway Equipment and Method: Bite block Placement Confirmation: positive ETCO2 Dental Injury: Teeth and Oropharynx as per pre-operative assessment

## 2016-08-09 NOTE — Transfer of Care (Addendum)
Immediate Anesthesia Transfer of Care Note  Patient: Melinda [name]  Procedure(s) Performed: ECT  Patient Location: PACU  Anesthesia Type:General  Level of Consciousness: sedated  Airway & Oxygen Therapy: Patient Spontanous Breathing and Patient connected to face mask oxygen  Post-op Assessment: Report given to RN and Post -op Vital signs reviewed and stable  Post vital signs: Reviewed and stable  Last Vitals:  Vitals:   08/09/16 1048 08/09/16 1057  BP: 121/60 118/71  Pulse: 88 84  Resp: 18 19  Temp:  36.8 C    Last Pain:  Vitals:   08/09/16 1057  TempSrc:   PainSc: 0-No pain      Patients Stated Pain Goal: 0 ([id number])  Complications: No apparent anesthesia complications

## 2016-08-09 NOTE — Procedures (Signed)
ECT SERVICES Physician's Interval Evaluation & Treatment Note  Patient Identification: Melinda [name] MRN:  [id number] Date of Evaluation:  08/09/2016 TX #: [id number]  MADRS:   MMSE:   P.E. Findings:  Blood pressure slightly elevated but otherwise full exam normal.  Psychiatric Interval Note:  Mood stable nonsuicidal blunted about usual.  Subjective:  Patient is a 45 y.o. female seen for evaluation for Electroconvulsive Therapy. No specific new complaint.  Treatment Summary:   []   Right Unilateral             [x]  Bilateral   % Energy : 1.0 ms 35%   Impedance: 1290 ohms  Seizure Energy Index: 3910 V squared  Postictal Suppression Index: 90%  Seizure Concordance Index: 92%  Medications  Pre Shock: Robinul 0.4 mg, Toradol 30 mg, esmolol 10 mg, labetalol 20 mg, Brevital 70 mg, succinylcholine 100 mg  Post Shock:    Seizure Duration: 41 seconds by EMG, 69 seconds by EEG   Comments: Patient will be seen regular follow-up one week next Friday   Lungs:  [x]   Clear to auscultation               []  Other:   Heart:    [x]   Regular rhythm             []  irregular rhythm    [x]   Previous H&P reviewed, patient examined and there are NO CHANGES                 []   Previous H&P reviewed, patient examined and there are changes noted.   [name], MD 9/29/201710:11 AM

## 2016-08-12 ENCOUNTER — Telehealth: Payer: Self-pay

## 2016-08-15 ENCOUNTER — Other Ambulatory Visit: Payer: Self-pay | Admitting: Psychiatry

## 2016-08-16 ENCOUNTER — Encounter
Admission: RE | Admit: 2016-08-16 | Discharge: 2016-08-16 | Disposition: A | Discharge status: Home / Self Care | Payer: Medicare PPO | Source: Ambulatory Visit | Attending: Psychiatry | Admitting: Psychiatry

## 2016-08-16 ENCOUNTER — Encounter: Payer: Self-pay | Admitting: Anesthesiology

## 2016-08-16 DIAGNOSIS — K219 Gastro-esophageal reflux disease without esophagitis: Secondary | ICD-10-CM | POA: Insufficient documentation

## 2016-08-16 DIAGNOSIS — Z833 Family history of diabetes mellitus: Secondary | ICD-10-CM | POA: Insufficient documentation

## 2016-08-16 DIAGNOSIS — E1142 Type 2 diabetes mellitus with diabetic polyneuropathy: Secondary | ICD-10-CM | POA: Insufficient documentation

## 2016-08-16 DIAGNOSIS — E7800 Pure hypercholesterolemia, unspecified: Secondary | ICD-10-CM | POA: Diagnosis not present

## 2016-08-16 DIAGNOSIS — Z888 Allergy status to other drugs, medicaments and biological substances status: Secondary | ICD-10-CM | POA: Insufficient documentation

## 2016-08-16 DIAGNOSIS — I10 Essential (primary) hypertension: Secondary | ICD-10-CM | POA: Insufficient documentation

## 2016-08-16 DIAGNOSIS — F329 Major depressive disorder, single episode, unspecified: Secondary | ICD-10-CM | POA: Diagnosis not present

## 2016-08-16 DIAGNOSIS — F609 Personality disorder, unspecified: Secondary | ICD-10-CM | POA: Diagnosis not present

## 2016-08-16 DIAGNOSIS — E669 Obesity, unspecified: Secondary | ICD-10-CM | POA: Insufficient documentation

## 2016-08-16 DIAGNOSIS — Z638 Other specified problems related to primary support group: Secondary | ICD-10-CM | POA: Diagnosis not present

## 2016-08-16 DIAGNOSIS — Z8249 Family history of ischemic heart disease and other diseases of the circulatory system: Secondary | ICD-10-CM | POA: Diagnosis not present

## 2016-08-16 DIAGNOSIS — F332 Major depressive disorder, recurrent severe without psychotic features: Secondary | ICD-10-CM | POA: Diagnosis not present

## 2016-08-16 DIAGNOSIS — E119 Type 2 diabetes mellitus without complications: Secondary | ICD-10-CM | POA: Diagnosis not present

## 2016-08-16 LAB — GLUCOSE, CAPILLARY: Glucose-Capillary: 111 mg/dL — ABNORMAL HIGH (ref 65–99)

## 2016-08-16 LAB — POCT PREGNANCY, URINE: Preg Test, Ur: NEGATIVE

## 2016-08-16 MED ORDER — ESMOLOL HCL 100 MG/10ML IV SOLN
INTRAVENOUS | Status: DC | PRN
  Administered 2016-08-16: 10 mg via INTRAVENOUS

## 2016-08-16 MED ORDER — SUCCINYLCHOLINE CHLORIDE 200 MG/10ML IV SOSY
PREFILLED_SYRINGE | INTRAVENOUS | Status: DC | PRN
  Administered 2016-08-16: 100 mg via INTRAVENOUS

## 2016-08-16 MED ORDER — SODIUM CHLORIDE 0.9 % IV SOLN
500.0000 mL | Freq: Once | INTRAVENOUS | Status: AC
  Administered 2016-08-16: 500 mL via INTRAVENOUS

## 2016-08-16 MED ORDER — LABETALOL HCL 5 MG/ML IV SOLN
INTRAVENOUS | Status: DC | PRN
  Administered 2016-08-16: 20 mg via INTRAVENOUS

## 2016-08-16 MED ORDER — METHOHEXITAL SODIUM 100 MG/10ML IV SOSY
PREFILLED_SYRINGE | INTRAVENOUS | Status: DC | PRN
  Administered 2016-08-16: 70 mg via INTRAVENOUS

## 2016-08-16 MED ORDER — KETOROLAC TROMETHAMINE 30 MG/ML IJ SOLN
30.0000 mg | Freq: Once | INTRAMUSCULAR | Status: AC
  Administered 2016-08-16: 30 mg via INTRAVENOUS

## 2016-08-16 MED ORDER — KETOROLAC TROMETHAMINE 30 MG/ML IJ SOLN
INTRAMUSCULAR | Status: AC
  Administered 2016-08-16: 30 mg via INTRAVENOUS
  Filled 2016-08-16: qty 1

## 2016-08-16 MED ORDER — SODIUM CHLORIDE 0.9 % IV SOLN
INTRAVENOUS | Status: DC | PRN
  Administered 2016-08-16: 10:00:00 via INTRAVENOUS

## 2016-08-16 MED ORDER — GLYCOPYRROLATE 0.2 MG/ML IJ SOLN
0.4000 mg | Freq: Once | INTRAMUSCULAR | Status: AC
  Administered 2016-08-16: 0.4 mg via INTRAVENOUS

## 2016-08-16 MED ORDER — GLYCOPYRROLATE 0.2 MG/ML IJ SOLN
INTRAMUSCULAR | Status: AC
  Filled 2016-08-16: qty 2

## 2016-08-16 NOTE — Procedures (Signed)
ECT SERVICES Physician's Interval Evaluation & Treatment Note  Patient Identification: Melinda [name] MRN:  [id number] Date of Evaluation:  08/16/2016 TX #: [id number]  MADRS: 20  MMSE: 29  P.E. Findings:  No change to physical. Vitals normal. Heart and lung normal.  Psychiatric Interval Note:  Feeling a little bit worse this week. Having some family stresses.  Subjective:  Patient is a 45 y.o. female seen for evaluation for Electroconvulsive Therapy. A little bit more down and nervous.  Treatment Summary:   []   Right Unilateral             [x]  Bilateral   % Energy : 1.0 ms, 35%   Impedance: 1700 ohms  Seizure Energy Index: 5049 V squared  Postictal Suppression Index: 53%  Seizure Concordance Index: 92%  Medications  Pre Shock: Robinul 0.4 mg, labetalol 20 mg, esmolol 10 mg, Toradol 30 mg, Brevital 70 mg, succinylcholine 100 mg  Post Shock:    Seizure Duration: 31 seconds by EMG, 45 seconds by EEG   Comments: Follow-up one week usual treatment   Lungs:  [x]   Clear to auscultation               []  Other:   Heart:    [x]   Regular rhythm             []  irregular rhythm    [x]   Previous H&P reviewed, patient examined and there are NO CHANGES                 []   Previous H&P reviewed, patient examined and there are changes noted.   [name], MD 10/6/201710:08 AM

## 2016-08-16 NOTE — Transfer of Care (Addendum)
Immediate Anesthesia Transfer of Care Note  Patient: Melinda [name]  Procedure(s) Performed: ECT  Patient Location: PACU  Anesthesia Type:General  Level of Consciousness: sedated  Airway & Oxygen Therapy: Patient Spontanous Breathing and Patient connected to face mask oxygen  Post-op Assessment: Report given to RN and Post -op Vital signs reviewed and stable  Post vital signs: Reviewed and stable  Last Vitals:  Vitals:   08/16/16 1033 08/16/16 1043  BP: (!) 131/92 (!) 164/68  Pulse: 89 85  Resp: 19 20  Temp:      Last Pain:  Vitals:   08/16/16 1023  TempSrc:   PainSc: Asleep      Patients Stated Pain Goal: 0 ([id number])  Complications: No apparent anesthesia complications

## 2016-08-16 NOTE — Anesthesia Procedure Notes (Signed)
Performed by: Khairi Garman Pre-anesthesia Checklist: Patient identified, Emergency Drugs available, Suction available and Patient being monitored Patient Re-evaluated:Patient Re-evaluated prior to inductionOxygen Delivery Method: Circle system utilized Preoxygenation: Pre-oxygenation with 100% oxygen Intubation Type: IV induction Ventilation: Mask ventilation without difficulty and Mask ventilation throughout procedure Airway Equipment and Method: Bite block Placement Confirmation: positive ETCO2 Dental Injury: Teeth and Oropharynx as per pre-operative assessment        

## 2016-08-16 NOTE — Addendum Note (Signed)
Addendum  created 08/16/16 1046 by Dionne Bucy, CRNA   Anesthesia Intra Meds edited, Sign clinical note

## 2016-08-16 NOTE — Anesthesia Preprocedure Evaluation (Signed)
Anesthesia Evaluation  Patient identified by MRN, date of birth, ID band Patient awake    Reviewed: Allergy & Precautions, H&P , NPO status , Patient's Chart, lab work & pertinent test results  History of Anesthesia Complications Negative for: history of anesthetic complications  Airway Mallampati: II   Neck ROM: full    Dental  (+) Poor Dentition, Chipped   Pulmonary sleep apnea , neg COPD,    Pulmonary exam normal        Cardiovascular hypertension, Pt. on medications (-) CAD and (-) Past MI negative cardio ROS Normal cardiovascular exam     Neuro/Psych negative neurological ROS     GI/Hepatic negative GI ROS, Neg liver ROS, GERD  Medicated,  Endo/Other  diabetes, Type 2, Oral Hypoglycemic Agents  Renal/GU negative Renal ROS  negative genitourinary   Musculoskeletal   Abdominal (+) + obese,   Peds  Hematology negative hematology ROS (+)   Anesthesia Other Findings Past Medical History: No date: Depression No date: Diabetes mellitus without complication ([place]) 99991111: Diabetic peripheral neuropathy ([place]) 03/07/15: Diabetic peripheral neuropathy ([place]) 03/07/15: Diabetic peripheral neuropathy (HCC) No date: GERD (gastroesophageal reflux disease) 03/07/15: Hypercholesterolemia No date: Hypertension 03/07/15: Obesity 03/07/15: Personality disorder 03/07/15: Sinus tachycardia ([place])     Comment: history of 03/07/15: Suicidal thoughts Past Surgical History: 03/07/15: electroconvulsion therapy BMI    Body Mass Index:  36.44 kg/m     Reproductive/Obstetrics                             Anesthesia Physical  Anesthesia Plan  ASA: III  Anesthesia Plan: General   Post-op Pain Management:    Induction: Intravenous  Airway Management Planned: Mask  Additional Equipment:   Intra-op Plan:   Post-operative Plan:   Informed Consent: I have reviewed the patients History and Physical,  chart, labs and discussed the procedure including the risks, benefits and alternatives for the proposed anesthesia with the patient or authorized representative who has indicated his/her understanding and acceptance.   Dental Advisory Given  Plan Discussed with: CRNA and Anesthesiologist  Anesthesia Plan Comments:         Anesthesia Quick Evaluation

## 2016-08-16 NOTE — H&P (Signed)
Melinda [name] is an 45 y.o. female.   Chief Complaint: Feeling a little bit worse this last week. Worried somewhat about her sister. More anxiety. Having some thoughts about picking at her face. HPI: Chronic severe depression and held at [place] by maintenance ECT treatment.  Past Medical History:  Diagnosis Date  . Depression   . Diabetes mellitus without complication ([place])   . Diabetic peripheral neuropathy ([place]) 03/07/15  . Diabetic peripheral neuropathy ([place]) 03/07/15  . Diabetic peripheral neuropathy ([place]) 03/07/15  . GERD (gastroesophageal reflux disease)   . Hypercholesterolemia 03/07/15  . Hypertension   . Obesity 03/07/15  . Personality disorder 03/07/15  . Sinus tachycardia 03/07/15   history of  . Suicidal thoughts 03/07/15    Past Surgical History:  Procedure Laterality Date  . electroconvulsion therapy  03/07/15    Family History  Problem Relation Age of Onset  . Hypertension Father   . Diabetes Mother    Social History:  reports that she has never smoked. She has never used smokeless tobacco. She reports that she does not drink alcohol or use drugs.  Allergies:  Allergies  Allergen Reactions  . Prednisone     Increases blood sugar     (Not in a hospital admission)  Results for orders placed or performed during the hospital encounter of 08/16/16 (from the past 48 hour(s))  Pregnancy, urine POC     Status: None   Collection Time: 08/16/16  8:56 AM  Result Value Ref Range   Preg Test, Ur NEGATIVE NEGATIVE    Comment:        THE SENSITIVITY OF THIS METHODOLOGY IS >24 mIU/mL   Glucose, capillary     Status: Abnormal   Collection Time: 08/16/16  9:04 AM  Result Value Ref Range   Glucose-Capillary 111 (H) 65 - 99 mg/dL   No results found.  Review of Systems  Constitutional: Negative.   HENT: Negative.   Eyes: Negative.   Respiratory: Negative.   Cardiovascular: Negative.   Gastrointestinal: Negative.   Musculoskeletal: Negative.   Skin: Negative.    Neurological: Negative.   Psychiatric/Behavioral: Positive for depression. Negative for hallucinations, memory loss, substance abuse and suicidal ideas. The patient is nervous/anxious. The patient does not have insomnia.     Blood pressure 120/83, pulse 93, temperature 98.3 F (36.8 C), temperature source Oral, resp. rate 18, height 5\' 4"  (1.626 m), weight 96.6 kg (213 lb), last menstrual period 08/01/2016, SpO2 98 %. Physical Exam   Assessment/Plan Stable. No indication to change plan. Supportive counseling and therapy. Follow-up in 1 week  [name], MD 08/16/2016, 10:05 AM

## 2016-08-16 NOTE — Anesthesia Postprocedure Evaluation (Signed)
Anesthesia Post Note  Patient: Melinda [name]  Procedure(s) Performed: * No procedures listed *  Patient location during evaluation: Endoscopy Anesthesia Type: General Level of consciousness: awake and alert Pain management: pain level controlled Vital Signs Assessment: post-procedure vital signs reviewed and stable Respiratory status: spontaneous breathing and respiratory function stable Cardiovascular status: stable Anesthetic complications: no    Last Vitals:  Vitals:   08/16/16 1023 08/16/16 1033  BP: 131/72 (!) 131/92  Pulse: 89 89  Resp: 11 19  Temp: 37.1 C     Last Pain:  Vitals:   08/16/16 1023  TempSrc:   PainSc: Asleep                 Ellajane Stong K

## 2016-08-16 NOTE — Discharge Instructions (Signed)
1)  The drugs that you have been given will stay in your system until tomorrow so for the       next 24 hours you should not:  A. Drive an automobile  B. Make any legal decisions  C. Drink any alcoholic beverages  2)  You may resume your regular meals upon return home.  3)  A responsible adult must take you home.  Someone should stay with you for a few          hours, then be available by phone for the remainder of the treatment day.  4)  You May experience any of the following symptoms:  Headache, Nausea and a dry mouth (due to the medications you were given),  temporary memory loss and some confusion, or sore muscles (a warm bath  should help this).  If you you experience any of these symptoms let us know on                your return visit.  5)  Report any of the following: any acute discomfort, severe headache, or temperature        greater than 100.5 F.   Also report any unusual redness, swelling, drainage, or pain         at your IV site.    You may report Symptoms to:  [place] [facility]          Phone: [phone], ECT Department           or Dr. Prescott Gum office [phone]  6)  Your next ECT Treatment is Day Friday  Date October 13  We will call 2 days prior to your scheduled appointment for arrival times.  7)  Nothing to eat or drink after midnight the night before your procedure.  8)  Take  Lisinopril     With a sip of water the morning of your procedure.  9)  Other Instructions: Call [phone] to cancel the morning of your procedure due         to illness or emergency.  10) We will call within 72 hours to assess how you are feeling.

## 2016-08-22 ENCOUNTER — Other Ambulatory Visit: Payer: Self-pay | Admitting: Psychiatry

## 2016-08-23 ENCOUNTER — Encounter (HOSPITAL_BASED_OUTPATIENT_CLINIC_OR_DEPARTMENT_OTHER)
Admission: RE | Admit: 2016-08-23 | Discharge: 2016-08-23 | Disposition: A | Discharge status: Home / Self Care | Payer: Medicare PPO | Source: Ambulatory Visit | Attending: Psychiatry | Admitting: Psychiatry

## 2016-08-23 ENCOUNTER — Encounter: Payer: Self-pay | Admitting: Anesthesiology

## 2016-08-23 DIAGNOSIS — F338 Other recurrent depressive disorders: Secondary | ICD-10-CM | POA: Diagnosis not present

## 2016-08-23 DIAGNOSIS — K219 Gastro-esophageal reflux disease without esophagitis: Secondary | ICD-10-CM | POA: Diagnosis not present

## 2016-08-23 DIAGNOSIS — E1142 Type 2 diabetes mellitus with diabetic polyneuropathy: Secondary | ICD-10-CM | POA: Diagnosis not present

## 2016-08-23 DIAGNOSIS — I10 Essential (primary) hypertension: Secondary | ICD-10-CM | POA: Diagnosis not present

## 2016-08-23 DIAGNOSIS — F609 Personality disorder, unspecified: Secondary | ICD-10-CM | POA: Diagnosis not present

## 2016-08-23 DIAGNOSIS — E119 Type 2 diabetes mellitus without complications: Secondary | ICD-10-CM | POA: Diagnosis not present

## 2016-08-23 DIAGNOSIS — F332 Major depressive disorder, recurrent severe without psychotic features: Secondary | ICD-10-CM | POA: Diagnosis not present

## 2016-08-23 DIAGNOSIS — E669 Obesity, unspecified: Secondary | ICD-10-CM | POA: Diagnosis not present

## 2016-08-23 DIAGNOSIS — E7800 Pure hypercholesterolemia, unspecified: Secondary | ICD-10-CM | POA: Diagnosis not present

## 2016-08-23 DIAGNOSIS — Z8249 Family history of ischemic heart disease and other diseases of the circulatory system: Secondary | ICD-10-CM | POA: Diagnosis not present

## 2016-08-23 DIAGNOSIS — Z833 Family history of diabetes mellitus: Secondary | ICD-10-CM | POA: Diagnosis not present

## 2016-08-23 DIAGNOSIS — F329 Major depressive disorder, single episode, unspecified: Secondary | ICD-10-CM | POA: Diagnosis not present

## 2016-08-23 LAB — GLUCOSE, CAPILLARY: Glucose-Capillary: 95 mg/dL (ref 65–99)

## 2016-08-23 LAB — POCT PREGNANCY, URINE: Preg Test, Ur: NEGATIVE

## 2016-08-23 MED ORDER — SODIUM CHLORIDE 0.9 % IV SOLN
INTRAVENOUS | Status: DC | PRN
  Administered 2016-08-23: 10:00:00 via INTRAVENOUS

## 2016-08-23 MED ORDER — SODIUM CHLORIDE 0.9 % IV SOLN
500.0000 mL | Freq: Once | INTRAVENOUS | Status: AC
  Administered 2016-08-23: 500 mL via INTRAVENOUS

## 2016-08-23 MED ORDER — SUCCINYLCHOLINE CHLORIDE 20 MG/ML IJ SOLN
INTRAMUSCULAR | Status: DC | PRN
  Administered 2016-08-23: 100 mg via INTRAVENOUS

## 2016-08-23 MED ORDER — METHOHEXITAL SODIUM 100 MG/10ML IV SOSY
PREFILLED_SYRINGE | INTRAVENOUS | Status: DC | PRN
  Administered 2016-08-23: 70 mg via INTRAVENOUS

## 2016-08-23 MED ORDER — KETOROLAC TROMETHAMINE 30 MG/ML IJ SOLN: 30.0000 mg | Freq: Once | INTRAMUSCULAR | Status: DC

## 2016-08-23 MED ORDER — GLYCOPYRROLATE 0.2 MG/ML IJ SOLN
INTRAMUSCULAR | Status: AC
Start: 2016-08-23 — End: 2016-08-23
  Administered 2016-08-23: 0.4 mg via INTRAVENOUS
  Filled 2016-08-23: qty 2

## 2016-08-23 MED ORDER — LABETALOL HCL 5 MG/ML IV SOLN
INTRAVENOUS | Status: DC | PRN
  Administered 2016-08-23: 20 mg via INTRAVENOUS

## 2016-08-23 MED ORDER — SODIUM CHLORIDE 0.9 % IV SOLN: 500.0000 mL | Freq: Once | INTRAVENOUS | Status: DC

## 2016-08-23 MED ORDER — GLYCOPYRROLATE 0.2 MG/ML IJ SOLN
0.4000 mg | Freq: Once | INTRAMUSCULAR | Status: AC
  Administered 2016-08-23: 0.4 mg via INTRAVENOUS

## 2016-08-23 MED ORDER — GLYCOPYRROLATE 0.2 MG/ML IJ SOLN: 0.4000 mg | Freq: Once | INTRAMUSCULAR | Status: DC

## 2016-08-23 MED ORDER — KETOROLAC TROMETHAMINE 30 MG/ML IJ SOLN
INTRAMUSCULAR | Status: AC
  Administered 2016-08-23: 30 mg via INTRAVENOUS
  Filled 2016-08-23: qty 1

## 2016-08-23 MED ORDER — ESMOLOL HCL 100 MG/10ML IV SOLN
INTRAVENOUS | Status: DC | PRN
  Administered 2016-08-23: 10 mg via INTRAVENOUS

## 2016-08-23 MED ORDER — KETOROLAC TROMETHAMINE 30 MG/ML IJ SOLN
30.0000 mg | Freq: Once | INTRAMUSCULAR | Status: AC
  Administered 2016-08-23: 30 mg via INTRAVENOUS

## 2016-08-23 NOTE — Anesthesia Postprocedure Evaluation (Signed)
Anesthesia Post Note  Patient: Melinda [name]  Procedure(s) Performed: * No procedures listed *  Patient location during evaluation: PACU Anesthesia Type: General Level of consciousness: awake and alert Pain management: pain level controlled Vital Signs Assessment: post-procedure vital signs reviewed and stable Respiratory status: spontaneous breathing, nonlabored ventilation, respiratory function stable and patient connected to nasal cannula oxygen Cardiovascular status: blood pressure returned to baseline and stable Postop Assessment: no signs of nausea or vomiting Anesthetic complications: no    Last Vitals:  Vitals:   08/23/16 1104 08/23/16 1114  BP: (!) 114/93 (!) 104/91  Pulse: 95 89  Resp: 15 16  Temp:      Last Pain:  Vitals:   08/23/16 1104  TempSrc:   PainSc: 0-No pain                 Precious Haws Kenna Kirn

## 2016-08-23 NOTE — H&P (Signed)
Melinda [name] is an 45 y.o. female.   Chief Complaint: Hal Hope continues to be a little bit more down than usual the last few weeks. No suicidal thoughts no mention of self-harm. Continues to function normally. HPI: Long-standing severe depression and stays stable with weekly maintenance ECT  Past Medical History:  Diagnosis Date  . Depression   . Diabetes mellitus without complication ([place])   . Diabetic peripheral neuropathy ([place]) 03/07/15  . Diabetic peripheral neuropathy ([place]) 03/07/15  . Diabetic peripheral neuropathy ([place]) 03/07/15  . GERD (gastroesophageal reflux disease)   . Hypercholesterolemia 03/07/15  . Hypertension   . Obesity 03/07/15  . Personality disorder 03/07/15  . Sinus tachycardia 03/07/15   history of  . Suicidal thoughts 03/07/15    Past Surgical History:  Procedure Laterality Date  . electroconvulsion therapy  03/07/15    Family History  Problem Relation Age of Onset  . Hypertension Father   . Diabetes Mother    Social History:  reports that she has never smoked. She has never used smokeless tobacco. She reports that she does not drink alcohol or use drugs.  Allergies:  Allergies  Allergen Reactions  . Prednisone     Increases blood sugar     (Not in a hospital admission)  Results for orders placed or performed during the hospital encounter of 08/23/16 (from the past 48 hour(s))  Pregnancy, urine POC     Status: None   Collection Time: 08/23/16  8:55 AM  Result Value Ref Range   Preg Test, Ur NEGATIVE NEGATIVE    Comment:        THE SENSITIVITY OF THIS METHODOLOGY IS >24 mIU/mL   Glucose, capillary     Status: None   Collection Time: 08/23/16  9:01 AM  Result Value Ref Range   Glucose-Capillary 95 65 - 99 mg/dL   No results found.  Review of Systems  Constitutional: Negative.   HENT: Negative.   Eyes: Negative.   Respiratory: Negative.   Cardiovascular: Negative.   Gastrointestinal: Negative.   Musculoskeletal: Negative.   Skin:  Negative.   Neurological: Negative.   Psychiatric/Behavioral: Positive for depression. Negative for hallucinations, memory loss, substance abuse and suicidal ideas. The patient is not nervous/anxious and does not have insomnia.     Blood pressure 133/75, pulse (!) 104, temperature 98.3 F (36.8 C), temperature source Oral, resp. rate 17, height 5\' 4"  (1.626 m), weight 95.3 kg (210 lb), last menstrual period 08/01/2016, SpO2 100 %. Physical Exam  Nursing note and vitals reviewed. Constitutional: She appears well-developed and well-nourished.  HENT:  Head: Normocephalic and atraumatic.  Eyes: Conjunctivae are normal. Pupils are equal, round, and reactive to light.  Neck: Normal range of motion.  Cardiovascular: Regular rhythm and normal heart sounds.   Respiratory: Effort normal. No respiratory distress.  GI: Soft.  Musculoskeletal: Normal range of motion.  Neurological: She is alert.  Skin: Skin is warm and dry.  Psychiatric: Judgment and thought content normal. Her affect is blunt. Her speech is delayed. She is withdrawn. Cognition and memory are normal. She expresses no suicidal ideation.     Assessment/Plan Follow-up weekly schedule supportive counseling and therapy review of medicine encourage her to continue working with her outpatient providers.  [name], MD 08/23/2016, 10:17 AM

## 2016-08-23 NOTE — Discharge Instructions (Signed)
1)  The drugs that you have been given will stay in your system until tomorrow so for the       next 24 hours you should not:  A. Drive an automobile  B. Make any legal decisions  C. Drink any alcoholic beverages  2)  You may resume your regular meals upon return home.  3)  A responsible adult must take you home.  Someone should stay with you for a few          hours, then be available by phone for the remainder of the treatment day.  4)  You May experience any of the following symptoms:  Headache, Nausea and a dry mouth (due to the medications you were given),  temporary memory loss and some confusion, or sore muscles (a warm bath  should help this).  If you you experience any of these symptoms let us know on                your return visit.  5)  Report any of the following: any acute discomfort, severe headache, or temperature        greater than 100.5 F.   Also report any unusual redness, swelling, drainage, or pain         at your IV site.    You may report Symptoms to:  [place] at [facility]          Phone: [phone], ECT Department           or Dr. Prescott Gum office [phone]  6)  Your next ECT Treatment is Day  Friday  Date  August 30, 2016  We will call 2 days prior to your scheduled appointment for arrival times.  7)  Nothing to eat or drink after midnight the night before your procedure.  8)  Take Lisinopril    With a sip of water the morning of your procedure.  9)  Other Instructions: Call [phone] to cancel the morning of your procedure due         to illness or emergency.  10) We will call within 72 hours to assess how you are feeling.

## 2016-08-23 NOTE — Transfer of Care (Signed)
Immediate Anesthesia Transfer of Care Note  Patient: Melinda [name]  Procedure(s) Performed: * No procedures listed *  Patient Location: PACU  Anesthesia Type:General  Level of Consciousness: patient cooperative and responds to stimulation  Airway & Oxygen Therapy: Patient Spontanous Breathing  Post-op Assessment: Report given to RN and Post -op Vital signs reviewed and stable  Post vital signs: Reviewed and stable  Last Vitals:  Vitals:   08/23/16 0841 08/23/16 1034  BP: 133/75 (!) 116/99  Pulse: (!) 104 99  Resp: 17 15  Temp: 36.8 C 37.2 C    Last Pain:  Vitals:   08/23/16 0841  TempSrc: Oral  PainSc:       Patients Stated Pain Goal: 0 ([id number])  Complications: No apparent anesthesia complications

## 2016-08-23 NOTE — Anesthesia Preprocedure Evaluation (Signed)
Anesthesia Evaluation  Patient identified by MRN, date of birth, ID band Patient awake    Reviewed: Allergy & Precautions, H&P , NPO status , Patient's Chart, lab work & pertinent test results  History of Anesthesia Complications Negative for: history of anesthetic complications  Airway Mallampati: II  TM Distance: >3 FB Neck ROM: full    Dental  (+) Poor Dentition, Chipped   Pulmonary sleep apnea , neg COPD,    Pulmonary exam normal        Cardiovascular hypertension, Pt. on medications (-) CAD and (-) Past MI negative cardio ROS Normal cardiovascular exam     Neuro/Psych PSYCHIATRIC DISORDERS  Neuromuscular disease negative neurological ROS     GI/Hepatic negative GI ROS, Neg liver ROS, GERD  Medicated,  Endo/Other  diabetes, Type 2, Oral Hypoglycemic Agents  Renal/GU negative Renal ROS  negative genitourinary   Musculoskeletal   Abdominal (+) + obese,   Peds  Hematology negative hematology ROS (+)   Anesthesia Other Findings Past Medical History: No date: Depression No date: Diabetes mellitus without complication ([place]) 99991111: Diabetic peripheral neuropathy ([place]) 03/07/15: Diabetic peripheral neuropathy ([place]) 03/07/15: Diabetic peripheral neuropathy ([facility]) No date: GERD (gastroesophageal reflux disease) 03/07/15: Hypercholesterolemia No date: Hypertension 03/07/15: Obesity 03/07/15: Personality disorder 03/07/15: Sinus tachycardia ([place])     Comment: history of 03/07/15: Suicidal thoughts Past Surgical History: 03/07/15: electroconvulsion therapy BMI    Body Mass Index:  36.44 kg/m     Reproductive/Obstetrics                             Anesthesia Physical  Anesthesia Plan  ASA: III  Anesthesia Plan: General   Post-op Pain Management:    Induction: Intravenous  Airway Management Planned: Mask  Additional Equipment:   Intra-op Plan:   Post-operative Plan:   Informed  Consent: I have reviewed the patients History and Physical, chart, labs and discussed the procedure including the risks, benefits and alternatives for the proposed anesthesia with the patient or authorized representative who has indicated his/her understanding and acceptance.   Dental Advisory Given  Plan Discussed with: CRNA and Anesthesiologist  Anesthesia Plan Comments:         Anesthesia Quick Evaluation

## 2016-08-23 NOTE — Procedures (Signed)
ECT SERVICES Physician's Interval Evaluation & Treatment Note  Patient Identification: Melinda [name] MRN:  [id number] Date of Evaluation:  08/23/2016 TX #: [id number]  MADRS:   MMSE:  P.E. Findings:  No change to physical exam. Vital signs normal. Heart and lungs normal.  Psychiatric Interval Note:  Continues to talk about feeling a little bit more down. No self injury no suicidal ideation.  Subjective:  Patient is a 45 y.o. female seen for evaluation for Electroconvulsive Therapy. Blunted affect a little bit on the downside than usual. Tolerating treatment well. No physical problems  Treatment Summary:   []   Right Unilateral             [x]  Bilateral   % Energy : 1.0 ms, 35%   Impedance: 1920 homes  Seizure Energy Index: No reading obtained also no reading obtain  Postictal Suppression Index: Also no reading  Seizure Concordance Index: 97%  Medications  Pre Shock: Robinul 0.4 mg Toradol 30 mg labetalol 20 mg esmolol 10 mg Brevital 70 mg succinylcholine 100 mg  Post Shock: None  Seizure Duration: 37 seconds by EMG 88 seconds by EEG   Comments: Follow-up one week usual schedule   Lungs:  [x]   Clear to auscultation               []  Other:   Heart:    [x]   Regular rhythm             []  irregular rhythm    [x]   Previous H&P reviewed, patient examined and there are NO CHANGES                 []   Previous H&P reviewed, patient examined and there are changes noted.   [name], MD 10/13/201710:19 AM

## 2016-08-29 ENCOUNTER — Other Ambulatory Visit: Payer: Self-pay | Admitting: Psychiatry

## 2016-08-30 ENCOUNTER — Encounter: Payer: Self-pay | Admitting: Anesthesiology

## 2016-08-30 ENCOUNTER — Encounter
Admission: RE | Admit: 2016-08-30 | Discharge: 2016-08-30 | Disposition: A | Discharge status: Home / Self Care | Payer: Medicare PPO | Source: Ambulatory Visit | Attending: Psychiatry | Admitting: Psychiatry

## 2016-08-30 DIAGNOSIS — E669 Obesity, unspecified: Secondary | ICD-10-CM | POA: Insufficient documentation

## 2016-08-30 DIAGNOSIS — I10 Essential (primary) hypertension: Secondary | ICD-10-CM | POA: Diagnosis not present

## 2016-08-30 DIAGNOSIS — Z6836 Body mass index (BMI) 36.0-36.9, adult: Secondary | ICD-10-CM | POA: Diagnosis not present

## 2016-08-30 DIAGNOSIS — Z8249 Family history of ischemic heart disease and other diseases of the circulatory system: Secondary | ICD-10-CM | POA: Diagnosis not present

## 2016-08-30 DIAGNOSIS — F339 Major depressive disorder, recurrent, unspecified: Secondary | ICD-10-CM | POA: Diagnosis not present

## 2016-08-30 DIAGNOSIS — K219 Gastro-esophageal reflux disease without esophagitis: Secondary | ICD-10-CM | POA: Insufficient documentation

## 2016-08-30 DIAGNOSIS — E1142 Type 2 diabetes mellitus with diabetic polyneuropathy: Secondary | ICD-10-CM | POA: Diagnosis not present

## 2016-08-30 DIAGNOSIS — Z888 Allergy status to other drugs, medicaments and biological substances status: Secondary | ICD-10-CM | POA: Insufficient documentation

## 2016-08-30 DIAGNOSIS — E119 Type 2 diabetes mellitus without complications: Secondary | ICD-10-CM | POA: Diagnosis not present

## 2016-08-30 DIAGNOSIS — Z833 Family history of diabetes mellitus: Secondary | ICD-10-CM | POA: Insufficient documentation

## 2016-08-30 DIAGNOSIS — F338 Other recurrent depressive disorders: Secondary | ICD-10-CM | POA: Diagnosis not present

## 2016-08-30 DIAGNOSIS — F332 Major depressive disorder, recurrent severe without psychotic features: Secondary | ICD-10-CM | POA: Diagnosis not present

## 2016-08-30 DIAGNOSIS — E7800 Pure hypercholesterolemia, unspecified: Secondary | ICD-10-CM | POA: Insufficient documentation

## 2016-08-30 LAB — POCT PREGNANCY, URINE: Preg Test, Ur: NEGATIVE

## 2016-08-30 LAB — GLUCOSE, CAPILLARY: Glucose-Capillary: 103 mg/dL — ABNORMAL HIGH (ref 65–99)

## 2016-08-30 MED ORDER — SUCCINYLCHOLINE CHLORIDE 20 MG/ML IJ SOLN
INTRAMUSCULAR | Status: DC | PRN
  Administered 2016-08-30: 100 mg via INTRAVENOUS

## 2016-08-30 MED ORDER — KETOROLAC TROMETHAMINE 30 MG/ML IJ SOLN
INTRAMUSCULAR | Status: AC
  Administered 2016-08-30: 30 mg via INTRAVENOUS
  Filled 2016-08-30: qty 1

## 2016-08-30 MED ORDER — SODIUM CHLORIDE 0.9 % IV SOLN
INTRAVENOUS | Status: DC | PRN
  Administered 2016-08-30: 10:00:00 via INTRAVENOUS

## 2016-08-30 MED ORDER — GLYCOPYRROLATE 0.2 MG/ML IJ SOLN
0.4000 mg | Freq: Once | INTRAMUSCULAR | Status: AC
  Administered 2016-08-30: 0.4 mg via INTRAVENOUS

## 2016-08-30 MED ORDER — METHOHEXITAL SODIUM 100 MG/10ML IV SOSY
PREFILLED_SYRINGE | INTRAVENOUS | Status: DC | PRN
  Administered 2016-08-30: 70 mg via INTRAVENOUS

## 2016-08-30 MED ORDER — LABETALOL HCL 5 MG/ML IV SOLN
INTRAVENOUS | Status: DC | PRN
  Administered 2016-08-30: 20 mg via INTRAVENOUS

## 2016-08-30 MED ORDER — GLYCOPYRROLATE 0.2 MG/ML IJ SOLN
INTRAMUSCULAR | Status: AC
  Administered 2016-08-30: 0.4 mg via INTRAVENOUS
  Filled 2016-08-30: qty 2

## 2016-08-30 MED ORDER — SODIUM CHLORIDE 0.9 % IV SOLN
500.0000 mL | Freq: Once | INTRAVENOUS | Status: AC
  Administered 2016-08-30: 500 mL via INTRAVENOUS

## 2016-08-30 MED ORDER — KETOROLAC TROMETHAMINE 30 MG/ML IJ SOLN
30.0000 mg | Freq: Once | INTRAMUSCULAR | Status: AC
  Administered 2016-08-30: 30 mg via INTRAVENOUS

## 2016-08-30 MED ORDER — ESMOLOL HCL 100 MG/10ML IV SOLN
INTRAVENOUS | Status: DC | PRN
  Administered 2016-08-30: 10 mg via INTRAVENOUS

## 2016-08-30 NOTE — H&P (Signed)
Melinda [name] is an 45 y.o. female.   Chief Complaint: No specific complaint. Mood is doing better. HPI: Chronic recurrent depression stable with weekly maintenance ECT and medication  Past Medical History:  Diagnosis Date  . Depression   . Diabetes mellitus without complication ([place])   . Diabetic peripheral neuropathy ([place]) 03/07/15  . Diabetic peripheral neuropathy ([place]) 03/07/15  . Diabetic peripheral neuropathy ([place]) 03/07/15  . GERD (gastroesophageal reflux disease)   . Hypercholesterolemia 03/07/15  . Hypertension   . Obesity 03/07/15  . Personality disorder 03/07/15  . Sinus tachycardia 03/07/15   history of  . Suicidal thoughts 03/07/15    Past Surgical History:  Procedure Laterality Date  . electroconvulsion therapy  03/07/15    Family History  Problem Relation Age of Onset  . Hypertension Father   . Diabetes Mother    Social History:  reports that she has never smoked. She has never used smokeless tobacco. She reports that she does not drink alcohol or use drugs.  Allergies:  Allergies  Allergen Reactions  . Prednisone     Increases blood sugar     (Not in a hospital admission)  Results for orders placed or performed during the hospital encounter of 08/30/16 (from the past 48 hour(s))  Glucose, capillary     Status: Abnormal   Collection Time: 08/30/16  8:21 AM  Result Value Ref Range   Glucose-Capillary 103 (H) 65 - 99 mg/dL  Pregnancy, urine POC     Status: None   Collection Time: 08/30/16  9:02 AM  Result Value Ref Range   Preg Test, Ur NEGATIVE NEGATIVE    Comment:        THE SENSITIVITY OF THIS METHODOLOGY IS >24 mIU/mL    No results found.  Review of Systems  Constitutional: Negative.   HENT: Negative.   Eyes: Negative.   Respiratory: Negative.   Cardiovascular: Negative.   Gastrointestinal: Negative.   Musculoskeletal: Negative.   Skin: Negative.   Neurological: Negative.   Psychiatric/Behavioral: Negative for depression, hallucinations,  memory loss, substance abuse and suicidal ideas. The patient is not nervous/anxious and does not have insomnia.     Blood pressure (!) 140/59, pulse 98, temperature 98.3 F (36.8 C), temperature source Oral, resp. rate 18, height 5\' 4"  (1.626 m), weight 97.1 kg (214 lb), last menstrual period 08/28/2016, SpO2 98 %. Physical Exam  Nursing note and vitals reviewed. Constitutional: She appears well-developed and well-nourished.  HENT:  Head: Normocephalic and atraumatic.  Eyes: Conjunctivae are normal. Pupils are equal, round, and reactive to light.  Neck: Normal range of motion.  Cardiovascular: Regular rhythm and normal heart sounds.   Respiratory: Effort normal. No respiratory distress.  GI: Soft.  Musculoskeletal: Normal range of motion.  Neurological: She is alert.  Skin: Skin is warm and dry.  Psychiatric: She has a normal mood and affect. Her behavior is normal. Judgment and thought content normal.     Assessment/Plan Overall seems to be doing better than the last couple weeks. Smiling a little bit more. No sign of dangerousness. Plan to continue weekly ECT treatment today as usual.  [name], MD 08/30/2016, 9:37 AM

## 2016-08-30 NOTE — Procedures (Signed)
ECT SERVICES Physician's Interval Evaluation & Treatment Note  Patient Identification: Melinda [name] MRN:  [id number] Date of Evaluation:  08/30/2016 TX #: [id number]  MADRS:   MMSE:   P.E. Findings:  No change to physical exam. Vitals normal. Heart and lungs normal.  Psychiatric Interval Note:  Affect and mood more upbeat more close to baseline.  Subjective:  Patient is a 45 y.o. female seen for evaluation for Electroconvulsive Therapy. No specific complaint  Treatment Summary:   []   Right Unilateral             [x]  Bilateral   % Energy : 1.0 ms 35%   Impedance: 2090 ohms  Seizure Energy Index: 7778 V squared  Postictal Suppression Index: 79%  Seizure Concordance Index: 91%  Medications  Pre Shock: Robinul 0.4 mg, labetalol 20 mg, esmolol 10 mg, Toradol 30 mg, Brevital 70 mg, succinylcholine 100 mg  Post Shock:    Seizure Duration: 42 seconds by EMG 79 seconds by EEG   Comments: Follow-up one week   Lungs:  [x]   Clear to auscultation               []  Other:   Heart:    [x]   Regular rhythm             []  irregular rhythm    [x]   Previous H&P reviewed, patient examined and there are NO CHANGES                 []   Previous H&P reviewed, patient examined and there are changes noted.   [name], MD 10/20/201710:04 AM

## 2016-08-30 NOTE — Discharge Instructions (Signed)
1)  The drugs that you have been given will stay in your system until tomorrow so for the       next 24 hours you should not:  A. Drive an automobile  B. Make any legal decisions  C. Drink any alcoholic beverages  2)  You may resume your regular meals upon return home.  3)  A responsible adult must take you home.  Someone should stay with you for a few          hours, then be available by phone for the remainder of the treatment day.  4)  You May experience any of the following symptoms:  Headache, Nausea and a dry mouth (due to the medications you were given),  temporary memory loss and some confusion, or sore muscles (a warm bath  should help this).  If you you experience any of these symptoms let us know on                your return visit.  5)  Report any of the following: any acute discomfort, severe headache, or temperature        greater than 100.5 F.   Also report any unusual redness, swelling, drainage, or pain         at your IV site.    You may report Symptoms to:  [place] at [facility]          Phone: [phone], ECT Department           or Dr. Prescott Gum office [phone]  6)  Your next ECT Treatment is Day Friday  Date September 06, 2016  We will call 2 days prior to your scheduled appointment for arrival times.  7)  Nothing to eat or drink after midnight the night before your procedure.  8)  Take Lisinopril    With a sip of water the morning of your procedure.  9)  Other Instructions: Call [phone] to cancel the morning of your procedure due         to illness or emergency.  10) We will call within 72 hours to assess how you are feeling.

## 2016-08-30 NOTE — Transfer of Care (Signed)
Immediate Anesthesia Transfer of Care Note  Patient: Melinda [name]  Procedure(s) Performed: * No procedures listed *  Patient Location: PACU  Anesthesia Type:General  Level of Consciousness: patient cooperative and lethargic  Airway & Oxygen Therapy: Patient Spontanous Breathing and Patient connected to face mask oxygen  Post-op Assessment: Report given to RN and Post -op Vital signs reviewed and stable  Post vital signs: Reviewed and stable  Last Vitals:  Vitals:   08/30/16 0813 08/30/16 1016  BP: (!) 140/59 (!) 169/90  Pulse: 98 (!) 101  Resp: 18 16  Temp: 36.8 C 36.5 C    Last Pain:  Vitals:   08/30/16 0813  TempSrc: Oral  PainSc: 1          Complications: No apparent anesthesia complications

## 2016-08-30 NOTE — Anesthesia Preprocedure Evaluation (Signed)
Anesthesia Evaluation  Patient identified by MRN, date of birth, ID band Patient awake    Reviewed: Allergy & Precautions, H&P , NPO status , Patient's Chart, lab work & pertinent test results  History of Anesthesia Complications Negative for: history of anesthetic complications  Airway Mallampati: II  TM Distance: >3 FB Neck ROM: full    Dental  (+) Poor Dentition, Chipped   Pulmonary sleep apnea , neg COPD,    Pulmonary exam normal breath sounds clear to auscultation       Cardiovascular hypertension, Pt. on medications (-) CAD and (-) Past MI negative cardio ROS Normal cardiovascular exam Rhythm:regular Rate:Normal     Neuro/Psych PSYCHIATRIC DISORDERS  Neuromuscular disease negative neurological ROS     GI/Hepatic negative GI ROS, Neg liver ROS, GERD  Medicated,  Endo/Other  diabetes, Type 2, Oral Hypoglycemic Agents  Renal/GU negative Renal ROS  negative genitourinary   Musculoskeletal   Abdominal (+) + obese,   Peds  Hematology negative hematology ROS (+)   Anesthesia Other Findings Past Medical History: No date: Depression No date: Diabetes mellitus without complication ([place]) 99991111: Diabetic peripheral neuropathy ([place]) 03/07/15: Diabetic peripheral neuropathy ([place]) 03/07/15: Diabetic peripheral neuropathy ([facility]) No date: GERD (gastroesophageal reflux disease) 03/07/15: Hypercholesterolemia No date: Hypertension 03/07/15: Obesity 03/07/15: Personality disorder 03/07/15: Sinus tachycardia ([place])     Comment: history of 03/07/15: Suicidal thoughts Past Surgical History: 03/07/15: electroconvulsion therapy BMI    Body Mass Index:  36.44 kg/m     Reproductive/Obstetrics                             Anesthesia Physical  Anesthesia Plan  ASA: III  Anesthesia Plan: General   Post-op Pain Management:    Induction: Intravenous  Airway Management Planned: Mask  Additional  Equipment:   Intra-op Plan:   Post-operative Plan:   Informed Consent: I have reviewed the patients History and Physical, chart, labs and discussed the procedure including the risks, benefits and alternatives for the proposed anesthesia with the patient or authorized representative who has indicated his/her understanding and acceptance.   Dental Advisory Given  Plan Discussed with: CRNA and Anesthesiologist  Anesthesia Plan Comments:         Anesthesia Quick Evaluation

## 2016-08-31 NOTE — Anesthesia Postprocedure Evaluation (Signed)
Anesthesia Post Note  Patient: Melinda [name]  Procedure(s) Performed: * No procedures listed *  Patient location during evaluation: PACU Anesthesia Type: General Level of consciousness: awake and alert Pain management: pain level controlled Vital Signs Assessment: post-procedure vital signs reviewed and stable Respiratory status: spontaneous breathing, nonlabored ventilation, respiratory function stable and patient connected to nasal cannula oxygen Cardiovascular status: blood pressure returned to baseline and stable Postop Assessment: no signs of nausea or vomiting Anesthetic complications: no    Last Vitals:  Vitals:   08/30/16 1043 08/30/16 1053  BP: 132/83 122/81  Pulse: 89 85  Resp: (!) 21 18  Temp: 37.1 C     Last Pain:  Vitals:   08/30/16 0813  TempSrc: Oral  PainSc: 1                  Precious Haws Roxi Hlavaty

## 2016-09-05 ENCOUNTER — Other Ambulatory Visit: Payer: Self-pay | Admitting: Psychiatry

## 2016-09-06 ENCOUNTER — Encounter: Payer: Self-pay | Admitting: Anesthesiology

## 2016-09-06 ENCOUNTER — Encounter
Admission: RE | Admit: 2016-09-06 | Discharge: 2016-09-06 | Disposition: A | Discharge status: Home / Self Care | Payer: Medicare PPO | Source: Ambulatory Visit | Attending: Psychiatry | Admitting: Psychiatry

## 2016-09-06 DIAGNOSIS — E669 Obesity, unspecified: Secondary | ICD-10-CM | POA: Insufficient documentation

## 2016-09-06 DIAGNOSIS — K219 Gastro-esophageal reflux disease without esophagitis: Secondary | ICD-10-CM | POA: Diagnosis not present

## 2016-09-06 DIAGNOSIS — F329 Major depressive disorder, single episode, unspecified: Secondary | ICD-10-CM | POA: Diagnosis not present

## 2016-09-06 DIAGNOSIS — I10 Essential (primary) hypertension: Secondary | ICD-10-CM | POA: Diagnosis not present

## 2016-09-06 DIAGNOSIS — E119 Type 2 diabetes mellitus without complications: Secondary | ICD-10-CM | POA: Diagnosis not present

## 2016-09-06 DIAGNOSIS — Z8249 Family history of ischemic heart disease and other diseases of the circulatory system: Secondary | ICD-10-CM | POA: Insufficient documentation

## 2016-09-06 DIAGNOSIS — E7800 Pure hypercholesterolemia, unspecified: Secondary | ICD-10-CM | POA: Diagnosis not present

## 2016-09-06 DIAGNOSIS — E1142 Type 2 diabetes mellitus with diabetic polyneuropathy: Secondary | ICD-10-CM | POA: Diagnosis not present

## 2016-09-06 DIAGNOSIS — F332 Major depressive disorder, recurrent severe without psychotic features: Secondary | ICD-10-CM

## 2016-09-06 DIAGNOSIS — Z6836 Body mass index (BMI) 36.0-36.9, adult: Secondary | ICD-10-CM | POA: Diagnosis not present

## 2016-09-06 DIAGNOSIS — F319 Bipolar disorder, unspecified: Secondary | ICD-10-CM | POA: Diagnosis not present

## 2016-09-06 DIAGNOSIS — Z833 Family history of diabetes mellitus: Secondary | ICD-10-CM | POA: Diagnosis not present

## 2016-09-06 DIAGNOSIS — Z888 Allergy status to other drugs, medicaments and biological substances status: Secondary | ICD-10-CM | POA: Insufficient documentation

## 2016-09-06 LAB — GLUCOSE, CAPILLARY
Glucose-Capillary: 94 mg/dL (ref 65–99)
Glucose-Capillary: 122 mg/dL — ABNORMAL HIGH (ref 65–99)

## 2016-09-06 LAB — POCT PREGNANCY, URINE: Preg Test, Ur: NEGATIVE

## 2016-09-06 MED ORDER — SODIUM CHLORIDE 0.9 % IV SOLN
500.0000 mL | Freq: Once | INTRAVENOUS | Status: AC
  Administered 2016-09-06: 500 mL via INTRAVENOUS

## 2016-09-06 MED ORDER — ONDANSETRON HCL 4 MG/2ML IJ SOLN: 4.0000 mg | Freq: Once | INTRAMUSCULAR | Status: DC | PRN

## 2016-09-06 MED ORDER — KETOROLAC TROMETHAMINE 30 MG/ML IJ SOLN
30.0000 mg | Freq: Once | INTRAMUSCULAR | Status: AC
  Administered 2016-09-06: 30 mg via INTRAVENOUS

## 2016-09-06 MED ORDER — KETOROLAC TROMETHAMINE 30 MG/ML IJ SOLN
INTRAMUSCULAR | Status: AC
  Administered 2016-09-06: 30 mg via INTRAVENOUS
  Filled 2016-09-06: qty 1

## 2016-09-06 MED ORDER — METHOHEXITAL SODIUM 100 MG/10ML IV SOSY
PREFILLED_SYRINGE | INTRAVENOUS | Status: DC | PRN
  Administered 2016-09-06: 70 mg via INTRAVENOUS

## 2016-09-06 MED ORDER — LABETALOL HCL 5 MG/ML IV SOLN
INTRAVENOUS | Status: DC | PRN
  Administered 2016-09-06: 20 mg via INTRAVENOUS

## 2016-09-06 MED ORDER — GLYCOPYRROLATE 0.2 MG/ML IJ SOLN
0.4000 mg | Freq: Once | INTRAMUSCULAR | Status: AC
  Administered 2016-09-06: 0.4 mg via INTRAVENOUS

## 2016-09-06 MED ORDER — GLYCOPYRROLATE 0.2 MG/ML IJ SOLN
INTRAMUSCULAR | Status: AC
  Administered 2016-09-06: 0.4 mg via INTRAVENOUS
  Filled 2016-09-06: qty 2

## 2016-09-06 MED ORDER — SODIUM CHLORIDE 0.9 % IV SOLN
INTRAVENOUS | Status: DC | PRN
  Administered 2016-09-06: 10:00:00 via INTRAVENOUS

## 2016-09-06 MED ORDER — SUCCINYLCHOLINE CHLORIDE 200 MG/10ML IV SOSY
PREFILLED_SYRINGE | INTRAVENOUS | Status: DC | PRN
  Administered 2016-09-06: 100 mg via INTRAVENOUS

## 2016-09-06 MED ORDER — FENTANYL CITRATE (PF) 100 MCG/2ML IJ SOLN: 25.0000 ug | INTRAMUSCULAR | Status: DC | PRN

## 2016-09-06 NOTE — Transfer of Care (Signed)
Immediate Anesthesia Transfer of Care Note  Patient: Melinda [name]  Procedure(s) Performed: * No procedures listed *  Patient Location: PACU  Anesthesia Type:General  Level of Consciousness: awake and sedated  Airway & Oxygen Therapy: Patient Spontanous Breathing and Patient connected to face mask oxygen  Post-op Assessment: Report given to RN and Post -op Vital signs reviewed and stable  Post vital signs: Reviewed and stable  Last Vitals:  Vitals:   09/06/16 0817 09/06/16 1025  BP: 136/81 (!) 140/93  Pulse: 84 96  Resp: 16 12  Temp:  36.7 C    Last Pain:  Vitals:   09/06/16 0817  TempSrc: Oral      Patients Stated Pain Goal: 0 ([id number])  Complications: No apparent anesthesia complications

## 2016-09-06 NOTE — Anesthesia Postprocedure Evaluation (Signed)
Anesthesia Post Note  Patient: Melinda [name]  Procedure(s) Performed: * No procedures listed *  Patient location during evaluation: PACU Anesthesia Type: General Level of consciousness: awake and alert Pain management: pain level controlled Vital Signs Assessment: post-procedure vital signs reviewed and stable Respiratory status: spontaneous breathing, nonlabored ventilation, respiratory function stable and patient connected to nasal cannula oxygen Cardiovascular status: blood pressure returned to baseline and stable Postop Assessment: no signs of nausea or vomiting Anesthetic complications: no    Last Vitals:  Vitals:   09/06/16 1052 09/06/16 1103  BP: 120/63 120/86  Pulse: 86 81  Resp: 14 16  Temp:      Last Pain:  Vitals:   09/06/16 0817  TempSrc: Oral                 Faolan [place] S

## 2016-09-06 NOTE — Procedures (Signed)
ECT SERVICES Physician's Interval Evaluation & Treatment Note  Patient Identification: Melinda [name] MRN:  [id number] Date of Evaluation:  09/06/2016 TX #: [id number]  MADRS: 15  MMSE: 29  P.E. Findings:  Physical exam normal. Vitals normal. Heart and lungs clear.  Psychiatric Interval Note:  Feeling a little bit more down. Lucid. Not actively suicidal.  Subjective:  Patient is a 45 y.o. female seen for evaluation for Electroconvulsive Therapy. A little bit down and admits to doing some picking recently  Treatment Summary:   []   Right Unilateral             []  Bilateral   % Energy : 1.0 ms 35%   Impedance: 1920 ohms  Seizure Energy Index: 9245 V squared  Postictal Suppression Index: 68%  Seizure Concordance Index: 94%  Medications  Pre Shock: Robinul 0.4 mg, labetalol 20 mg, esmolol 10 mg, Toradol 30 mg, Brevital 70 mg, succinylcholine 100 mg  Post Shock:    Seizure Duration: 32 seconds by EMG, 61 seconds by EEG   Comments: Follow-up one week   Lungs:  [x]   Clear to auscultation               []  Other:   Heart:    [x]   Regular rhythm             []  irregular rhythm    [x]   Previous H&P reviewed, patient examined and there are NO CHANGES                 []   Previous H&P reviewed, patient examined and there are changes noted.   [name], MD 10/27/201710:07 AM

## 2016-09-06 NOTE — Anesthesia Preprocedure Evaluation (Addendum)
Anesthesia Evaluation  Patient identified by MRN, date of birth, ID band Patient awake    Reviewed: Allergy & Precautions, NPO status , Patient's Chart, lab work & pertinent test results, reviewed documented beta blocker date and time   Airway Mallampati: III  TM Distance: >3 FB     Dental  (+) Chipped   Pulmonary sleep apnea ,           Cardiovascular hypertension,      Neuro/Psych PSYCHIATRIC DISORDERS Depression Bipolar Disorder  Neuromuscular disease    GI/Hepatic GERD  Controlled,  Endo/Other  diabetes, Type 2  Renal/GU      Musculoskeletal   Abdominal   Peds  Hematology   Anesthesia Other Findings Obese. Denies sleep apnea.  Reproductive/Obstetrics                            Anesthesia Physical Anesthesia Plan  ASA: III  Anesthesia Plan: General   Post-op Pain Management:    Induction: Intravenous  Airway Management Planned: Mask  Additional Equipment:   Intra-op Plan:   Post-operative Plan:   Informed Consent: I have reviewed the patients History and Physical, chart, labs and discussed the procedure including the risks, benefits and alternatives for the proposed anesthesia with the patient or authorized representative who has indicated his/her understanding and acceptance.     Plan Discussed with: CRNA  Anesthesia Plan Comments:        Anesthesia Quick Evaluation

## 2016-09-06 NOTE — H&P (Signed)
Melinda [name] is an 45 y.o. female.   Chief Complaint: Feeling a little bit more down this time. Still worried about family members. Has been picking her face a bit. HPI: Long-standing depression stable with regular ECT treatment. No other active medical problems that are complicating things. Diabetes is very stable.  Past Medical History:  Diagnosis Date  . Depression   . Diabetes mellitus without complication ([place])   . Diabetic peripheral neuropathy ([place]) 03/07/15  . Diabetic peripheral neuropathy ([place]) 03/07/15  . Diabetic peripheral neuropathy ([place]) 03/07/15  . GERD (gastroesophageal reflux disease)   . Hypercholesterolemia 03/07/15  . Hypertension   . Obesity 03/07/15  . Personality disorder 03/07/15  . Sinus tachycardia 03/07/15   history of  . Suicidal thoughts 03/07/15    Past Surgical History:  Procedure Laterality Date  . electroconvulsion therapy  03/07/15    Family History  Problem Relation Age of Onset  . Hypertension Father   . Diabetes Mother    Social History:  reports that she has never smoked. She has never used smokeless tobacco. She reports that she does not drink alcohol or use drugs.  Allergies:  Allergies  Allergen Reactions  . Prednisone     Increases blood sugar     (Not in a hospital admission)  Results for orders placed or performed during the hospital encounter of 09/06/16 (from the past 48 hour(s))  Pregnancy, urine POC     Status: None   Collection Time: 09/06/16  8:13 AM  Result Value Ref Range   Preg Test, Ur NEGATIVE NEGATIVE    Comment:        THE SENSITIVITY OF THIS METHODOLOGY IS >24 mIU/mL   Glucose, capillary     Status: None   Collection Time: 09/06/16  8:20 AM  Result Value Ref Range   Glucose-Capillary 94 65 - 99 mg/dL   No results found.  Review of Systems  Constitutional: Negative.   HENT: Negative.   Eyes: Negative.   Respiratory: Negative.   Cardiovascular: Negative.   Gastrointestinal: Negative.    Musculoskeletal: Negative.   Skin: Negative.   Neurological: Negative.   Psychiatric/Behavioral: Positive for depression. Negative for hallucinations, memory loss, substance abuse and suicidal ideas. The patient is not nervous/anxious and does not have insomnia.     Blood pressure 136/81, pulse 84, resp. rate 16, height 5\' 4"  (1.626 m), weight 95.3 kg (210 lb), last menstrual period 08/28/2016, SpO2 97 %. Physical Exam  Nursing note and vitals reviewed. Constitutional: She appears well-developed and well-nourished.  HENT:  Head: Normocephalic and atraumatic.  Eyes: Conjunctivae are normal. Pupils are equal, round, and reactive to light.  Neck: Normal range of motion.  Cardiovascular: Regular rhythm and normal heart sounds.   Respiratory: Effort normal. No respiratory distress.  GI: Soft.  Musculoskeletal: Normal range of motion.  Neurological: She is alert.  Skin: Skin is warm and dry.  Psychiatric: Her speech is normal and behavior is normal. Judgment and thought content normal. Cognition and memory are normal. She exhibits a depressed mood.     Assessment/Plan Treatment today and continue on the weekly schedule. Supportive therapy and review of overall plan. Patient agreeable.  [name], MD 09/06/2016, 10:05 AM

## 2016-09-06 NOTE — Anesthesia Procedure Notes (Signed)
Date/Time: 09/06/2016 10:19 AM Performed by: Nelda Marseille Pre-anesthesia Checklist: Patient identified, Emergency Drugs available, Suction available, Patient being monitored and Timeout performed Oxygen Delivery Method: Simple face mask and Ambu bag

## 2016-09-06 NOTE — Discharge Instructions (Signed)
1)  The drugs that you have been given will stay in your system until tomorrow so for the       next 24 hours you should not:  A. Drive an automobile  B. Make any legal decisions  C. Drink any alcoholic beverages  2)  You may resume your regular meals upon return home.  3)  A responsible adult must take you home.  Someone should stay with you for a few          hours, then be available by phone for the remainder of the treatment day.  4)  You May experience any of the following symptoms:  Headache, Nausea and a dry mouth (due to the medications you were given),  temporary memory loss and some confusion, or sore muscles (a warm bath  should help this).  If you you experience any of these symptoms let us know on                your return visit.  5)  Report any of the following: any acute discomfort, severe headache, or temperature        greater than 100.5 F.   Also report any unusual redness, swelling, drainage, or pain         at your IV site.    You may report Symptoms to:  [place] [facility]          Phone: [phone], ECT Department           or Dr. Prescott Gum office [phone]  6)  Your next ECT Treatment is Day Friday Date September 13, 2016  We will call 2 days prior to your scheduled appointment for arrival times.  7)  Nothing to eat or drink after midnight the night before your procedure.  8)  Take Lisinopril   With a sip of water the morning of your procedure.  9)  Other Instructions: Call [phone] to cancel the morning of your procedure due         to illness or emergency.  10) We will call within 72 hours to assess how you are feeling. 1)  The drugs that you have been given will stay in your system until tomorrow so for the       next 24 hours you should not:  A. Drive an automobile  B. Make any legal decisions  C. Drink any alcoholic beverages  2)  You may resume your regular meals upon return home.  3)  A responsible adult must take you home.  Someone  should stay with you for a few          hours, then be available by phone for the remainder of the treatment day.  4)  You May experience any of the following symptoms:  Headache, Nausea and a dry mouth (due to the medications you were given),  temporary memory loss and some confusion, or sore muscles (a warm bath  should help this).  If you you experience any of these symptoms let us know on                your return visit.  5)  Report any of the following: any acute discomfort, severe headache, or temperature        greater than 100.5 F.   Also report any unusual redness, swelling, drainage, or pain         at your IV site.    You may report Symptoms to:  [facility] [place] at [facility]          Phone: [phone], ECT Department           or Dr. Prescott Gum office [phone]  6)  Your next ECT Treatment is Friday, September 13, 2016.  We will call 2 days prior to your scheduled appointment for arrival times.  7)  Nothing to eat or drink after midnight the night before your procedure.  8)  Take lisinopril with a sip of water the morning of your procedure.  9)  Other Instructions: Call [phone] to cancel the morning of your procedure due         to illness or emergency.  10) We will call within 72 hours to assess how you are feeling.

## 2016-09-12 ENCOUNTER — Other Ambulatory Visit: Payer: Self-pay | Admitting: Psychiatry

## 2016-09-13 ENCOUNTER — Encounter
Admission: RE | Admit: 2016-09-13 | Discharge: 2016-09-13 | Disposition: A | Discharge status: Home / Self Care | Payer: Medicare PPO | Source: Ambulatory Visit | Attending: Psychiatry | Admitting: Psychiatry

## 2016-09-13 ENCOUNTER — Encounter: Payer: Self-pay | Admitting: Anesthesiology

## 2016-09-13 DIAGNOSIS — Z8249 Family history of ischemic heart disease and other diseases of the circulatory system: Secondary | ICD-10-CM | POA: Diagnosis not present

## 2016-09-13 DIAGNOSIS — F648 Other gender identity disorders: Secondary | ICD-10-CM | POA: Diagnosis not present

## 2016-09-13 DIAGNOSIS — K219 Gastro-esophageal reflux disease without esophagitis: Secondary | ICD-10-CM | POA: Diagnosis not present

## 2016-09-13 DIAGNOSIS — Z6835 Body mass index (BMI) 35.0-35.9, adult: Secondary | ICD-10-CM | POA: Insufficient documentation

## 2016-09-13 DIAGNOSIS — E1142 Type 2 diabetes mellitus with diabetic polyneuropathy: Secondary | ICD-10-CM | POA: Diagnosis not present

## 2016-09-13 DIAGNOSIS — F332 Major depressive disorder, recurrent severe without psychotic features: Secondary | ICD-10-CM

## 2016-09-13 DIAGNOSIS — Z833 Family history of diabetes mellitus: Secondary | ICD-10-CM | POA: Diagnosis not present

## 2016-09-13 DIAGNOSIS — I10 Essential (primary) hypertension: Secondary | ICD-10-CM | POA: Insufficient documentation

## 2016-09-13 DIAGNOSIS — E7800 Pure hypercholesterolemia, unspecified: Secondary | ICD-10-CM | POA: Diagnosis not present

## 2016-09-13 DIAGNOSIS — Z888 Allergy status to other drugs, medicaments and biological substances status: Secondary | ICD-10-CM | POA: Insufficient documentation

## 2016-09-13 DIAGNOSIS — E119 Type 2 diabetes mellitus without complications: Secondary | ICD-10-CM | POA: Diagnosis not present

## 2016-09-13 DIAGNOSIS — F329 Major depressive disorder, single episode, unspecified: Secondary | ICD-10-CM | POA: Diagnosis not present

## 2016-09-13 DIAGNOSIS — E669 Obesity, unspecified: Secondary | ICD-10-CM | POA: Diagnosis not present

## 2016-09-13 LAB — POCT PREGNANCY, URINE: Preg Test, Ur: NEGATIVE

## 2016-09-13 LAB — GLUCOSE, CAPILLARY
Glucose-Capillary: 96 mg/dL (ref 65–99)
Glucose-Capillary: 108 mg/dL — ABNORMAL HIGH (ref 65–99)

## 2016-09-13 MED ORDER — KETOROLAC TROMETHAMINE 30 MG/ML IJ SOLN
INTRAMUSCULAR | Status: AC
  Administered 2016-09-13: 30 mg via INTRAVENOUS
  Filled 2016-09-13: qty 1

## 2016-09-13 MED ORDER — METHOHEXITAL SODIUM 100 MG/10ML IV SOSY
PREFILLED_SYRINGE | INTRAVENOUS | Status: DC | PRN
  Administered 2016-09-13: 70 mg via INTRAVENOUS

## 2016-09-13 MED ORDER — KETOROLAC TROMETHAMINE 30 MG/ML IJ SOLN
30.0000 mg | Freq: Once | INTRAMUSCULAR | Status: AC
  Administered 2016-09-13: 30 mg via INTRAVENOUS

## 2016-09-13 MED ORDER — LABETALOL HCL 5 MG/ML IV SOLN
INTRAVENOUS | Status: DC | PRN
  Administered 2016-09-13: 20 mg via INTRAVENOUS

## 2016-09-13 MED ORDER — GLYCOPYRROLATE 0.2 MG/ML IJ SOLN
0.4000 mg | Freq: Once | INTRAMUSCULAR | Status: AC
  Administered 2016-09-13: 0.4 mg via INTRAVENOUS

## 2016-09-13 MED ORDER — SODIUM CHLORIDE 0.9 % IV SOLN
INTRAVENOUS | Status: DC | PRN
  Administered 2016-09-13: 10:00:00 via INTRAVENOUS

## 2016-09-13 MED ORDER — ESMOLOL HCL 100 MG/10ML IV SOLN
INTRAVENOUS | Status: DC | PRN
  Administered 2016-09-13: 10 mg via INTRAVENOUS

## 2016-09-13 MED ORDER — SODIUM CHLORIDE 0.9 % IV SOLN
500.0000 mL | Freq: Once | INTRAVENOUS | Status: AC
  Administered 2016-09-13: 500 mL via INTRAVENOUS

## 2016-09-13 MED ORDER — GLYCOPYRROLATE 0.2 MG/ML IJ SOLN
INTRAMUSCULAR | Status: AC
  Administered 2016-09-13: 0.4 mg via INTRAVENOUS
  Filled 2016-09-13: qty 2

## 2016-09-13 MED ORDER — SUCCINYLCHOLINE CHLORIDE 200 MG/10ML IV SOSY
PREFILLED_SYRINGE | INTRAVENOUS | Status: DC | PRN
  Administered 2016-09-13: 100 mg via INTRAVENOUS

## 2016-09-13 NOTE — Anesthesia Postprocedure Evaluation (Signed)
Anesthesia Post Note  Patient: Melinda [name]  Procedure(s) Performed: * No procedures listed *  Patient location during evaluation: PACU Anesthesia Type: General Level of consciousness: awake and alert Pain management: pain level controlled Vital Signs Assessment: post-procedure vital signs reviewed and stable Respiratory status: spontaneous breathing, nonlabored ventilation, respiratory function stable and patient connected to nasal cannula oxygen Cardiovascular status: blood pressure returned to baseline and stable Postop Assessment: no signs of nausea or vomiting Anesthetic complications: no    Last Vitals:  Vitals:   09/13/16 1100 09/13/16 1107  BP: 102/84 119/86  Pulse: 82 97  Resp: (!) 27 17  Temp: 36.9 C     Last Pain:  Vitals:   09/13/16 0840  TempSrc: Oral  PainSc: 0-No pain                 Precious Haws Natasja Niday

## 2016-09-13 NOTE — Procedures (Signed)
ECT SERVICES Physician's Interval Evaluation & Treatment Note  Patient Identification: Melinda [name] MRN:  [id number] Date of Evaluation:  09/13/2016 TX #: [id number]  MADRS:   MMSE:   P.E. Findings:  No change to physical exam. Vitals stable. Heart clear lungs clear  Psychiatric Interval Note:  Mood stable slightly dysphoric not as bad as other times.  Subjective:  Patient is a 45 y.o. female seen for evaluation for Electroconvulsive Therapy. No specific new complaint  Treatment Summary:   []   Right Unilateral             []  Bilateral   % Energy : 1.0 ms 35%   Impedance: 1020 ohms  Seizure Energy Index: 15,395 V squared  Postictal Suppression Index: 44%  Seizure Concordance Index: 90%  Medications  Pre Shock: Robinul 0.4 mg, labetalol 20 mg, Toradol 30 mg, esmolol 10 mg, Brevital 70 mg, succinylcholine 100 mg  Post Shock:    Seizure Duration: 40 seconds by EMG, 74 seconds by EEG   Comments: Follow-up one week   Lungs:  [x]   Clear to auscultation               []  Other:   Heart:    [x]   Regular rhythm             []  irregular rhythm    [x]   Previous H&P reviewed, patient examined and there are NO CHANGES                 []   Previous H&P reviewed, patient examined and there are changes noted.   [name], MD 11/3/201710:02 AM

## 2016-09-13 NOTE — Anesthesia Procedure Notes (Signed)
Date/Time: 09/13/2016 10:12 AM Performed by: Dionne Bucy Pre-anesthesia Checklist: Patient identified, Emergency Drugs available, Suction available and Patient being monitored Patient Re-evaluated:Patient Re-evaluated prior to inductionOxygen Delivery Method: Circle system utilized Preoxygenation: Pre-oxygenation with 100% oxygen Intubation Type: IV induction Ventilation: Mask ventilation without difficulty and Mask ventilation throughout procedure Airway Equipment and Method: Bite block Placement Confirmation: positive ETCO2 Dental Injury: Teeth and Oropharynx as per pre-operative assessment

## 2016-09-13 NOTE — H&P (Signed)
Melinda [name] is an 45 y.o. female.   Chief Complaint: Mood stable. Home stress. No suicidal thought. No physical complaint  HPI: chronic depression on maintenance ECT  Past Medical History:  Diagnosis Date  . Depression   . Diabetes mellitus without complication ([place])   . Diabetic peripheral neuropathy ([place]) 03/07/15  . Diabetic peripheral neuropathy ([place]) 03/07/15  . Diabetic peripheral neuropathy ([place]) 03/07/15  . GERD (gastroesophageal reflux disease)   . Hypercholesterolemia 03/07/15  . Hypertension   . Obesity 03/07/15  . Personality disorder 03/07/15  . Sinus tachycardia 03/07/15   history of  . Suicidal thoughts 03/07/15    Past Surgical History:  Procedure Laterality Date  . electroconvulsion therapy  03/07/15    Family History  Problem Relation Age of Onset  . Hypertension Father   . Diabetes Mother    Social History:  reports that she has never smoked. She has never used smokeless tobacco. She reports that she does not drink alcohol or use drugs.  Allergies:  Allergies  Allergen Reactions  . Prednisone     Increases blood sugar     (Not in a hospital admission)  Results for orders placed or performed during the hospital encounter of 09/13/16 (from the past 48 hour(s))  Glucose, capillary     Status: None   Collection Time: 09/13/16  9:21 AM  Result Value Ref Range   Glucose-Capillary 96 65 - 99 mg/dL  Pregnancy, urine POC     Status: None   Collection Time: 09/13/16  9:24 AM  Result Value Ref Range   Preg Test, Ur NEGATIVE NEGATIVE    Comment:        THE SENSITIVITY OF THIS METHODOLOGY IS >24 mIU/mL    No results found.  Review of Systems  Constitutional: Negative.   HENT: Negative.   Eyes: Negative.   Respiratory: Negative.   Cardiovascular: Negative.   Gastrointestinal: Negative.   Musculoskeletal: Negative.   Skin: Negative.   Neurological: Negative.   Psychiatric/Behavioral: Positive for depression. Negative for hallucinations, memory  loss, substance abuse and suicidal ideas. The patient is not nervous/anxious and does not have insomnia.     Blood pressure 122/88, pulse 92, temperature 97.7 F (36.5 C), temperature source Oral, resp. rate 17, height 5\' 4"  (1.626 m), weight 93 kg (205 lb), last menstrual period 08/28/2016, SpO2 99 %. Physical Exam  Nursing note and vitals reviewed. Constitutional: She appears well-developed and well-nourished.  HENT:  Head: Normocephalic and atraumatic.  Eyes: Conjunctivae are normal. Pupils are equal, round, and reactive to light.  Neck: Normal range of motion.  Cardiovascular: Regular rhythm and normal heart sounds.   Respiratory: Effort normal. No respiratory distress.  GI: Soft.  Musculoskeletal: Normal range of motion.  Neurological: She is alert.  Skin: Skin is warm and dry.  Psychiatric: She has a normal mood and affect. Her behavior is normal. Judgment and thought content normal.     Assessment/Plan ECT today as usual with no change to plan.  [name], MD 09/13/2016, 9:38 AM

## 2016-09-13 NOTE — Discharge Instructions (Signed)
1)  The drugs that you have been given will stay in your system until tomorrow so for the       next 24 hours you should not:  A. Drive an automobile  B. Make any legal decisions  C. Drink any alcoholic beverages  2)  You may resume your regular meals upon return home.  3)  A responsible adult must take you home.  Someone should stay with you for a few          hours, then be available by phone for the remainder of the treatment day.  4)  You May experience any of the following symptoms:  Headache, Nausea and a dry mouth (due to the medications you were given),  temporary memory loss and some confusion, or sore muscles (a warm bath  should help this).  If you you experience any of these symptoms let us know on                your return visit.  5)  Report any of the following: any acute discomfort, severe headache, or temperature        greater than 100.5 F.   Also report any unusual redness, swelling, drainage, or pain         at your IV site.    You may report Symptoms to:  [place] at [facility]          Phone: [phone], ECT Department           or Dr. Prescott Gum office [phone]  6)  Your next ECT Treatment is Day Friday  Date September 20, 2016  We will call 2 days prior to your scheduled appointment for arrival times.  7)  Nothing to eat or drink after midnight the night before your procedure.  8)  Take Lisinopril    With a sip of water the morning of your procedure.  9)  Other Instructions: Call [phone] to cancel the morning of your procedure due         to illness or emergency.  10) We will call within 72 hours to assess how you are feeling.

## 2016-09-13 NOTE — Transfer of Care (Addendum)
Immediate Anesthesia Transfer of Care Note  Patient: Melinda [name]  Procedure(s) Performed: ECT  Patient Location: PACU  Anesthesia Type:General  Level of Consciousness: awake and sedated  Airway & Oxygen Therapy: Patient Spontanous Breathing and Patient connected to face mask oxygen  Post-op Assessment: Report given to RN and Post -op Vital signs reviewed and stable  Post vital signs: Reviewed and stable  Last Vitals:  Vitals:   09/13/16 1029 09/13/16 1032  BP:  (!) 126/101  Pulse: 90 89  Resp: 19 15  Temp: 37.2 C     Last Pain:  Vitals:   09/13/16 0840  TempSrc: Oral  PainSc: 0-No pain      Patients Stated Pain Goal: 0 ([id number])  Complications: No apparent anesthesia complications

## 2016-09-13 NOTE — Anesthesia Preprocedure Evaluation (Signed)
Anesthesia Evaluation  Patient identified by MRN, date of birth, ID band Patient awake    Reviewed: Allergy & Precautions, H&P , NPO status , Patient's Chart, lab work & pertinent test results  History of Anesthesia Complications Negative for: history of anesthetic complications  Airway Mallampati: II  TM Distance: >3 FB Neck ROM: full    Dental  (+) Poor Dentition, Chipped   Pulmonary sleep apnea , neg COPD,    Pulmonary exam normal breath sounds clear to auscultation       Cardiovascular hypertension, Pt. on medications (-) CAD and (-) Past MI negative cardio ROS Normal cardiovascular exam Rhythm:regular Rate:Normal     Neuro/Psych PSYCHIATRIC DISORDERS Depression Bipolar Disorder  Neuromuscular disease negative neurological ROS     GI/Hepatic negative GI ROS, Neg liver ROS, GERD  Medicated,  Endo/Other  diabetes, Type 2, Oral Hypoglycemic Agents  Renal/GU negative Renal ROS  negative genitourinary   Musculoskeletal   Abdominal (+) + obese,   Peds  Hematology negative hematology ROS (+)   Anesthesia Other Findings Past Medical History: No date: Depression No date: Diabetes mellitus without complication ([facility]) 03/07/15: Diabetic peripheral neuropathy ([facility]) 03/07/15: Diabetic peripheral neuropathy ([facility]) 03/07/15: Diabetic peripheral neuropathy ([facility]) No date: GERD (gastroesophageal reflux disease) 03/07/15: Hypercholesterolemia No date: Hypertension 03/07/15: Obesity 03/07/15: Personality disorder 03/07/15: Sinus tachycardia ([facility])     Comment: history of 03/07/15: Suicidal thoughts Past Surgical History: 03/07/15: electroconvulsion therapy BMI    Body Mass Index:  36.44 kg/m     Reproductive/Obstetrics                             Anesthesia Physical  Anesthesia Plan  ASA: III  Anesthesia Plan: General   Post-op Pain Management:    Induction: Intravenous  Airway Management  Planned: Mask  Additional Equipment:   Intra-op Plan:   Post-operative Plan:   Informed Consent: I have reviewed the patients History and Physical, chart, labs and discussed the procedure including the risks, benefits and alternatives for the proposed anesthesia with the patient or authorized representative who has indicated his/her understanding and acceptance.   Dental Advisory Given  Plan Discussed with: CRNA and Anesthesiologist  Anesthesia Plan Comments:         Anesthesia Quick Evaluation  

## 2016-09-20 ENCOUNTER — Other Ambulatory Visit: Payer: Self-pay | Admitting: Psychiatry

## 2016-09-20 ENCOUNTER — Encounter: Payer: Self-pay | Admitting: Anesthesiology

## 2016-09-20 ENCOUNTER — Encounter (HOSPITAL_BASED_OUTPATIENT_CLINIC_OR_DEPARTMENT_OTHER)
Admission: RE | Admit: 2016-09-20 | Discharge: 2016-09-20 | Disposition: A | Discharge status: Home / Self Care | Payer: Medicare PPO | Source: Ambulatory Visit | Attending: Psychiatry | Admitting: Psychiatry

## 2016-09-20 DIAGNOSIS — K219 Gastro-esophageal reflux disease without esophagitis: Secondary | ICD-10-CM | POA: Diagnosis not present

## 2016-09-20 DIAGNOSIS — Z8249 Family history of ischemic heart disease and other diseases of the circulatory system: Secondary | ICD-10-CM | POA: Diagnosis not present

## 2016-09-20 DIAGNOSIS — Z833 Family history of diabetes mellitus: Secondary | ICD-10-CM | POA: Diagnosis not present

## 2016-09-20 DIAGNOSIS — F329 Major depressive disorder, single episode, unspecified: Secondary | ICD-10-CM | POA: Diagnosis not present

## 2016-09-20 DIAGNOSIS — Z6835 Body mass index (BMI) 35.0-35.9, adult: Secondary | ICD-10-CM | POA: Diagnosis not present

## 2016-09-20 DIAGNOSIS — F319 Bipolar disorder, unspecified: Secondary | ICD-10-CM | POA: Diagnosis not present

## 2016-09-20 DIAGNOSIS — I10 Essential (primary) hypertension: Secondary | ICD-10-CM | POA: Diagnosis not present

## 2016-09-20 DIAGNOSIS — F332 Major depressive disorder, recurrent severe without psychotic features: Secondary | ICD-10-CM

## 2016-09-20 DIAGNOSIS — E669 Obesity, unspecified: Secondary | ICD-10-CM | POA: Diagnosis not present

## 2016-09-20 DIAGNOSIS — E119 Type 2 diabetes mellitus without complications: Secondary | ICD-10-CM | POA: Diagnosis not present

## 2016-09-20 DIAGNOSIS — E1142 Type 2 diabetes mellitus with diabetic polyneuropathy: Secondary | ICD-10-CM | POA: Diagnosis not present

## 2016-09-20 DIAGNOSIS — E7800 Pure hypercholesterolemia, unspecified: Secondary | ICD-10-CM | POA: Diagnosis not present

## 2016-09-20 LAB — GLUCOSE, CAPILLARY: Glucose-Capillary: 92 mg/dL (ref 65–99)

## 2016-09-20 MED ORDER — SUCCINYLCHOLINE CHLORIDE 20 MG/ML IJ SOLN
INTRAMUSCULAR | Status: DC | PRN
  Administered 2016-09-20: 100 mg via INTRAVENOUS

## 2016-09-20 MED ORDER — METHOHEXITAL SODIUM 100 MG/10ML IV SOSY
PREFILLED_SYRINGE | INTRAVENOUS | Status: DC | PRN
  Administered 2016-09-20: 70 mg via INTRAVENOUS

## 2016-09-20 MED ORDER — LABETALOL HCL 5 MG/ML IV SOLN
INTRAVENOUS | Status: DC | PRN
  Administered 2016-09-20: 20 mg via INTRAVENOUS

## 2016-09-20 MED ORDER — SODIUM CHLORIDE 0.9 % IV SOLN
INTRAVENOUS | Status: DC | PRN
  Administered 2016-09-20: 10:00:00 via INTRAVENOUS

## 2016-09-20 MED ORDER — SODIUM CHLORIDE 0.9 % IV SOLN
500.0000 mL | Freq: Once | INTRAVENOUS | Status: AC
Start: 2016-09-20 — End: 2016-09-20
  Administered 2016-09-20: 500 mL via INTRAVENOUS

## 2016-09-20 MED ORDER — KETOROLAC TROMETHAMINE 30 MG/ML IJ SOLN
30.0000 mg | Freq: Once | INTRAMUSCULAR | Status: AC
  Administered 2016-09-20: 30 mg via INTRAVENOUS

## 2016-09-20 MED ORDER — KETOROLAC TROMETHAMINE 30 MG/ML IJ SOLN
INTRAMUSCULAR | Status: AC
  Administered 2016-09-20: 30 mg via INTRAVENOUS
  Filled 2016-09-20: qty 1

## 2016-09-20 MED ORDER — ESMOLOL HCL 100 MG/10ML IV SOLN
INTRAVENOUS | Status: DC | PRN
  Administered 2016-09-20: 10 mg via INTRAVENOUS

## 2016-09-20 MED ORDER — GLYCOPYRROLATE 0.2 MG/ML IJ SOLN
INTRAMUSCULAR | Status: AC
  Administered 2016-09-20: 0.4 mg via INTRAVENOUS
  Filled 2016-09-20: qty 2

## 2016-09-20 MED ORDER — GLYCOPYRROLATE 0.2 MG/ML IJ SOLN
0.4000 mg | Freq: Once | INTRAMUSCULAR | Status: AC
  Administered 2016-09-20: 0.4 mg via INTRAVENOUS

## 2016-09-20 NOTE — Discharge Instructions (Signed)
1)  The drugs that you have been given will stay in your system until tomorrow so for the       next 24 hours you should not:  A. Drive an automobile  B. Make any legal decisions  C. Drink any alcoholic beverages  2)  You may resume your regular meals upon return home.  3)  A responsible adult must take you home.  Someone should stay with you for a few          hours, then be available by phone for the remainder of the treatment day.  4)  You May experience any of the following symptoms:  Headache, Nausea and a dry mouth (due to the medications you were given),  temporary memory loss and some confusion, or sore muscles (a warm bath  should help this).  If you you experience any of these symptoms let us know on                your return visit.  5)  Report any of the following: any acute discomfort, severe headache, or temperature        greater than 100.5 F.   Also report any unusual redness, swelling, drainage, or pain         at your IV site.    You may report Symptoms to:  [place] at [facility]          Phone: [phone], ECT Department           or Dr. Prescott Gum office [phone]  6)  Your next ECT Treatment is Day Friday Date September 27, 2016  We will call 2 days prior to your scheduled appointment for arrival times.  7)  Nothing to eat or drink after midnight the night before your procedure.  8)  Take Lisinopril   With a sip of water the morning of your procedure.  9)  Other Instructions: Call [phone] to cancel the morning of your procedure due         to illness or emergency.  10) We will call within 72 hours to assess how you are feeling.

## 2016-09-20 NOTE — Transfer of Care (Signed)
Immediate Anesthesia Transfer of Care Note  Patient: Melinda [name]  Procedure(s) Performed: ect   Patient Location: PACU  Anesthesia Type:General  Level of Consciousness: awake  Airway & Oxygen Therapy: Patient Spontanous Breathing and Patient connected to face mask oxygen  Post-op Assessment: Report given to RN and Post -op Vital signs reviewed and stable  Post vital signs: Reviewed  Last Vitals:  Vitals:   09/20/16 0809 09/20/16 1017  BP: (!) 150/81 (!) 153/110  Pulse: (!) 101 96  Resp: 16 16  Temp: 37 C 37.1 C    Last Pain:  Vitals:   09/20/16 0809  TempSrc: Oral         Complications: No apparent anesthesia complications

## 2016-09-20 NOTE — Anesthesia Preprocedure Evaluation (Signed)
Anesthesia Evaluation  Patient identified by MRN, date of birth, ID band Patient awake    Reviewed: Allergy & Precautions, H&P , NPO status , Patient's Chart, lab work & pertinent test results  History of Anesthesia Complications Negative for: history of anesthetic complications  Airway Mallampati: II  TM Distance: >3 FB Neck ROM: full    Dental  (+) Poor Dentition, Chipped   Pulmonary sleep apnea , neg COPD,    Pulmonary exam normal breath sounds clear to auscultation- rhonchi (-) wheezing      Cardiovascular hypertension, Pt. on medications (-) CAD and (-) Past MI negative cardio ROS Normal cardiovascular exam Rhythm:Regular Rate:Normal - Systolic murmurs and - Diastolic murmurs    Neuro/Psych Depression Bipolar Disorder negative neurological ROS     GI/Hepatic negative GI ROS, Neg liver ROS, GERD  Medicated,  Endo/Other  diabetes, Type 2, Oral Hypoglycemic Agents  Renal/GU negative Renal ROS  negative genitourinary   Musculoskeletal   Abdominal (+) + obese,   Peds  Hematology negative hematology ROS (+)   Anesthesia Other Findings Past Medical History: No date: Depression No date: Diabetes mellitus without complication ([place]) 99991111: Diabetic peripheral neuropathy ([place]) 03/07/15: Diabetic peripheral neuropathy ([place]) 03/07/15: Diabetic peripheral neuropathy (HCC) No date: GERD (gastroesophageal reflux disease) 03/07/15: Hypercholesterolemia No date: Hypertension 03/07/15: Obesity 03/07/15: Personality disorder 03/07/15: Sinus tachycardia ([place])     Comment: history of 03/07/15: Suicidal thoughts Past Surgical History: 03/07/15: electroconvulsion therapy BMI    Body Mass Index:  36.44 kg/m     Reproductive/Obstetrics                             Anesthesia Physical  Anesthesia Plan  ASA: III  Anesthesia Plan: General   Post-op Pain Management:    Induction:  Intravenous  Airway Management Planned: Mask  Additional Equipment:   Intra-op Plan:   Post-operative Plan:   Informed Consent: I have reviewed the patients History and Physical, chart, labs and discussed the procedure including the risks, benefits and alternatives for the proposed anesthesia with the patient or authorized representative who has indicated his/her understanding and acceptance.   Dental Advisory Given  Plan Discussed with: CRNA and Anesthesiologist  Anesthesia Plan Comments:         Anesthesia Quick Evaluation

## 2016-09-20 NOTE — H&P (Signed)
Melinda [name] is an 45 y.o. female.   Chief Complaint: Mood slightly down but no different than usual not exceptional no suicidal ideation. HPI: Chronic depression and maintain stability with regular maintenance ECT.  Past Medical History:  Diagnosis Date  . Depression   . Diabetes mellitus without complication ([place])   . Diabetic peripheral neuropathy ([place]) 03/07/15  . Diabetic peripheral neuropathy ([place]) 03/07/15  . Diabetic peripheral neuropathy ([place]) 03/07/15  . GERD (gastroesophageal reflux disease)   . Hypercholesterolemia 03/07/15  . Hypertension   . Obesity 03/07/15  . Personality disorder 03/07/15  . Sinus tachycardia 03/07/15   history of  . Suicidal thoughts 03/07/15    Past Surgical History:  Procedure Laterality Date  . electroconvulsion therapy  03/07/15    Family History  Problem Relation Age of Onset  . Hypertension Father   . Diabetes Mother    Social History:  reports that she has never smoked. She has never used smokeless tobacco. She reports that she does not drink alcohol or use drugs.  Allergies:  Allergies  Allergen Reactions  . Prednisone     Increases blood sugar     (Not in a hospital admission)  Results for orders placed or performed during the hospital encounter of 09/20/16 (from the past 48 hour(s))  Glucose, capillary     Status: None   Collection Time: 09/20/16  8:19 AM  Result Value Ref Range   Glucose-Capillary 92 65 - 99 mg/dL   No results found.  Review of Systems  Constitutional: Negative.   HENT: Negative.   Eyes: Negative.   Respiratory: Negative.   Cardiovascular: Negative.   Gastrointestinal: Negative.   Musculoskeletal: Negative.   Skin: Negative.   Neurological: Negative.   Psychiatric/Behavioral: Negative.  Negative for depression.    Blood pressure (!) 150/81, pulse (!) 101, temperature 98.6 F (37 C), temperature source Oral, resp. rate 16, height 5\' 4"  (1.626 m), weight 96.6 kg (213 lb), last menstrual period  09/12/2016, SpO2 100 %. Physical Exam  Nursing note and vitals reviewed. Constitutional: She appears well-developed and well-nourished.  HENT:  Head: Normocephalic and atraumatic.  Eyes: Conjunctivae are normal. Pupils are equal, round, and reactive to light.  Neck: Normal range of motion.  Cardiovascular: Normal heart sounds.   Respiratory: Effort normal.  GI: Soft.  Musculoskeletal: Normal range of motion.  Neurological: She is alert.  Skin: Skin is warm and dry.  Psychiatric: Judgment normal. Her affect is blunt. Her speech is delayed. She is slowed. Cognition and memory are normal. She expresses no suicidal ideation.     Assessment/Plan ECT today follow-up one week.  [name], MD 09/20/2016, 10:11 AM

## 2016-09-20 NOTE — Procedures (Signed)
ECT SERVICES Physician's Interval Evaluation & Treatment Note  Patient Identification: Melinda [name] MRN:  [id number] Date of Evaluation:  09/20/2016 TX #: [id number]  MADRS:   MMSE:   P.E. Findings:  No change to physical exam. Lungs clear heart normal.  Psychiatric Interval Note:  Mood stable. Not great but about the same as usual.  Subjective:  Patient is a 45 y.o. female seen for evaluation for Electroconvulsive Therapy. No specific new complaint  Treatment Summary:   []   Right Unilateral             [x]  Bilateral   % Energy : 1.0 ms 35%   Impedance: 850 ohms  Seizure Energy Index: 3233 V squared  Postictal Suppression Index: 65%  Seizure Concordance Index: 91%  Medications  Pre Shock: Toradol 30 mg Robinul 0.4 mg labetalol 20 mg esmolol 10 mg Brevital 70 mg succinylcholine 100 mg  Post Shock:    Seizure Duration: 37 seconds by EMG 58 seconds by EEG   Comments: Follow-up one week   Lungs:  []   Clear to auscultation               []  Other:   Heart:    [x]   Regular rhythm             []  irregular rhythm    [x]   Previous H&P reviewed, patient examined and there are NO CHANGES                 [x]   Previous H&P reviewed, patient examined and there are changes noted.   [name], MD 11/10/201710:13 AM

## 2016-09-20 NOTE — Anesthesia Postprocedure Evaluation (Signed)
Anesthesia Post Note  Patient: Melinda [name]  Procedure(s) Performed: * No procedures listed *  Patient location during evaluation: PACU Anesthesia Type: General Level of consciousness: awake and alert Pain management: pain level controlled Vital Signs Assessment: post-procedure vital signs reviewed and stable Respiratory status: spontaneous breathing, nonlabored ventilation and respiratory function stable Cardiovascular status: blood pressure returned to baseline and stable Postop Assessment: no signs of nausea or vomiting Anesthetic complications: no    Last Vitals:  Vitals:   09/20/16 1048 09/20/16 1054  BP:    Pulse:  89  Resp:  (!) 24  Temp: 37.4 C 37.4 C    Last Pain:  Vitals:   09/20/16 1054  TempSrc: Oral                 Danitra Payano

## 2016-09-26 ENCOUNTER — Other Ambulatory Visit: Payer: Self-pay | Admitting: Psychiatry

## 2016-09-27 ENCOUNTER — Encounter: Payer: Self-pay | Admitting: Anesthesiology

## 2016-09-27 ENCOUNTER — Ambulatory Visit
Admission: RE | Admit: 2016-09-27 | Discharge: 2016-09-27 | Disposition: A | Discharge status: Home / Self Care | Payer: Medicare PPO | Source: Ambulatory Visit | Attending: Psychiatry | Admitting: Psychiatry

## 2016-09-27 DIAGNOSIS — F338 Other recurrent depressive disorders: Secondary | ICD-10-CM | POA: Diagnosis not present

## 2016-09-27 DIAGNOSIS — E7800 Pure hypercholesterolemia, unspecified: Secondary | ICD-10-CM | POA: Diagnosis not present

## 2016-09-27 DIAGNOSIS — Z6836 Body mass index (BMI) 36.0-36.9, adult: Secondary | ICD-10-CM | POA: Diagnosis not present

## 2016-09-27 DIAGNOSIS — I10 Essential (primary) hypertension: Secondary | ICD-10-CM | POA: Diagnosis not present

## 2016-09-27 DIAGNOSIS — F332 Major depressive disorder, recurrent severe without psychotic features: Secondary | ICD-10-CM

## 2016-09-27 DIAGNOSIS — K219 Gastro-esophageal reflux disease without esophagitis: Secondary | ICD-10-CM | POA: Diagnosis not present

## 2016-09-27 DIAGNOSIS — E1142 Type 2 diabetes mellitus with diabetic polyneuropathy: Secondary | ICD-10-CM | POA: Diagnosis not present

## 2016-09-27 DIAGNOSIS — F329 Major depressive disorder, single episode, unspecified: Secondary | ICD-10-CM | POA: Insufficient documentation

## 2016-09-27 DIAGNOSIS — E669 Obesity, unspecified: Secondary | ICD-10-CM | POA: Diagnosis not present

## 2016-09-27 LAB — GLUCOSE, CAPILLARY: Glucose-Capillary: 80 mg/dL (ref 65–99)

## 2016-09-27 LAB — POCT PREGNANCY, URINE: Preg Test, Ur: NEGATIVE

## 2016-09-27 MED ORDER — METHOHEXITAL SODIUM 100 MG/10ML IV SOSY
PREFILLED_SYRINGE | INTRAVENOUS | Status: DC | PRN
  Administered 2016-09-27: 70 mg via INTRAVENOUS

## 2016-09-27 MED ORDER — LABETALOL HCL 5 MG/ML IV SOLN
INTRAVENOUS | Status: DC | PRN
  Administered 2016-09-27: 20 mg via INTRAVENOUS

## 2016-09-27 MED ORDER — ESMOLOL HCL 100 MG/10ML IV SOLN
INTRAVENOUS | Status: DC | PRN
  Administered 2016-09-27: 10 mg via INTRAVENOUS

## 2016-09-27 MED ORDER — KETOROLAC TROMETHAMINE 30 MG/ML IJ SOLN
INTRAMUSCULAR | Status: AC
  Administered 2016-09-27: 30 mg via INTRAVENOUS
  Filled 2016-09-27: qty 1

## 2016-09-27 MED ORDER — GLYCOPYRROLATE 0.2 MG/ML IJ SOLN
INTRAMUSCULAR | Status: AC
  Administered 2016-09-27: 0.4 mg via INTRAVENOUS
  Filled 2016-09-27: qty 2

## 2016-09-27 MED ORDER — SUCCINYLCHOLINE CHLORIDE 200 MG/10ML IV SOSY
PREFILLED_SYRINGE | INTRAVENOUS | Status: DC | PRN
  Administered 2016-09-27: 100 mg via INTRAVENOUS

## 2016-09-27 MED ORDER — KETOROLAC TROMETHAMINE 30 MG/ML IJ SOLN
30.0000 mg | Freq: Once | INTRAMUSCULAR | Status: AC
  Administered 2016-09-27: 30 mg via INTRAVENOUS

## 2016-09-27 MED ORDER — GLYCOPYRROLATE 0.2 MG/ML IJ SOLN
0.4000 mg | Freq: Once | INTRAMUSCULAR | Status: AC
  Administered 2016-09-27: 0.4 mg via INTRAVENOUS

## 2016-09-27 MED ORDER — SODIUM CHLORIDE 0.9 % IV SOLN
500.0000 mL | Freq: Once | INTRAVENOUS | Status: AC
  Administered 2016-09-27: 500 mL via INTRAVENOUS

## 2016-09-27 MED ORDER — SODIUM CHLORIDE 0.9 % IV SOLN
INTRAVENOUS | Status: DC | PRN
  Administered 2016-09-27: 10:00:00 via INTRAVENOUS

## 2016-09-27 NOTE — Transfer of Care (Signed)
Immediate Anesthesia Transfer of Care Note  Patient: Melinda [name]  Procedure(s) Performed: ECT  Patient Location: PACU  Anesthesia Type:General  Level of Consciousness: sedated  Airway & Oxygen Therapy: Patient Spontanous Breathing and Patient connected to face mask oxygen  Post-op Assessment: Report given to RN and Post -op Vital signs reviewed and stable  Post vital signs: Reviewed and stable  Last Vitals:  Vitals:   09/27/16 0803 09/27/16 1029  BP: 138/72 (!) 152/81  Pulse: (!) 106 (!) 103  Resp: 18 20  Temp: 36.9 C 37.5 C    Last Pain:  Vitals:   09/27/16 0803  TempSrc: Oral         Complications: No apparent anesthesia complications

## 2016-09-27 NOTE — Anesthesia Procedure Notes (Signed)
Date/Time: 09/27/2016 10:19 AM Performed by: Dionne Bucy Pre-anesthesia Checklist: Patient identified, Emergency Drugs available, Suction available and Patient being monitored Patient Re-evaluated:Patient Re-evaluated prior to inductionOxygen Delivery Method: Circle system utilized Preoxygenation: Pre-oxygenation with 100% oxygen Intubation Type: IV induction Ventilation: Mask ventilation without difficulty and Mask ventilation throughout procedure Airway Equipment and Method: Bite block Placement Confirmation: positive ETCO2 Dental Injury: Teeth and Oropharynx as per pre-operative assessment

## 2016-09-27 NOTE — H&P (Signed)
Melinda [name] is an 45 y.o. female.   Chief Complaint: Patient has no specific new complaint. Chronic depression. No suicidal ideation. HPI: Long-standing depression stabilized with regular maintenance ECT  Past Medical History:  Diagnosis Date  . Depression   . Diabetes mellitus without complication ([place])   . Diabetic peripheral neuropathy ([place]) 03/07/15  . Diabetic peripheral neuropathy ([place]) 03/07/15  . Diabetic peripheral neuropathy ([place]) 03/07/15  . GERD (gastroesophageal reflux disease)   . Hypercholesterolemia 03/07/15  . Hypertension   . Obesity 03/07/15  . Personality disorder 03/07/15  . Sinus tachycardia 03/07/15   history of  . Suicidal thoughts 03/07/15    Past Surgical History:  Procedure Laterality Date  . electroconvulsion therapy  03/07/15    Family History  Problem Relation Age of Onset  . Hypertension Father   . Diabetes Mother    Social History:  reports that she has never smoked. She has never used smokeless tobacco. She reports that she does not drink alcohol or use drugs.  Allergies:  Allergies  Allergen Reactions  . Prednisone     Increases blood sugar     (Not in a hospital admission)  Results for orders placed or performed during the hospital encounter of 09/27/16 (from the past 48 hour(s))  Glucose, capillary     Status: None   Collection Time: 09/27/16  8:15 AM  Result Value Ref Range   Glucose-Capillary 80 65 - 99 mg/dL  Pregnancy, urine POC     Status: None   Collection Time: 09/27/16  9:15 AM  Result Value Ref Range   Preg Test, Ur NEGATIVE NEGATIVE    Comment:        THE SENSITIVITY OF THIS METHODOLOGY IS >24 mIU/mL    No results found.  Review of Systems  Constitutional: Negative.   HENT: Negative.   Eyes: Negative.   Respiratory: Negative.   Cardiovascular: Negative.   Gastrointestinal: Negative.   Musculoskeletal: Negative.   Skin: Negative.   Neurological: Negative.   Psychiatric/Behavioral: Positive for depression.  Negative for hallucinations, memory loss, substance abuse and suicidal ideas. The patient is not nervous/anxious and does not have insomnia.     Blood pressure 138/72, pulse (!) 106, temperature 98.4 F (36.9 C), temperature source Oral, resp. rate 18, height 5\' 4"  (1.626 m), weight 95.3 kg (210 lb), last menstrual period 09/12/2016, SpO2 100 %. Physical Exam  Nursing note and vitals reviewed. Constitutional: She appears well-developed and well-nourished.  HENT:  Head: Normocephalic and atraumatic.  Eyes: Conjunctivae are normal. Pupils are equal, round, and reactive to light.  Neck: Normal range of motion.  Cardiovascular: Regular rhythm and normal heart sounds.   Respiratory: Effort normal. No respiratory distress.  GI: Soft.  Musculoskeletal: Normal range of motion.  Neurological: She is alert.  Skin: Skin is warm and dry.  Psychiatric: She has a normal mood and affect. Her behavior is normal. Judgment and thought content normal.     Assessment/Plan Follow-up in 2 weeks due to the schedule of the holidays. She is asking me to refill her medications which I do not think is necessary or appropriate right now. Patient has been strongly encouraged to get back in touch with her outpatient psychiatrist, Dr. Rosine Door, for follow-up treatment  [name], MD 09/27/2016, 10:23 AM

## 2016-09-27 NOTE — Procedures (Signed)
ECT SERVICES Physician's Interval Evaluation & Treatment Note  Patient Identification: Melinda [name] MRN:  [id number] Date of Evaluation:  09/27/2016 TX #: [id number]  MADRS:   MMSE: 29  P.E. Findings:  No change to physical exam  Psychiatric Interval Note:  Mood chronically mildly depressed no active suicidal ideation  Subjective:  Patient is a 45 y.o. female seen for evaluation for Electroconvulsive Therapy. No specific new complaint  Treatment Summary:   []   Right Unilateral             [x]  Bilateral   % Energy : 1.0 ms 35%   Impedance: 710 ohms  Seizure Energy Index: 5008 V squared  Postictal Suppression Index: 86%  Seizure Concordance Index: 98%  Medications  Pre Shock: Robinul 0.4 mg, Toradol 30 mg, labetalol 20 mg, esmolol 10 mg, Brevital 70 mg, succinylcholine 100 mg  Post Shock:    Seizure Duration: 35 seconds by EMG 91 seconds by EEG   Comments: Follow-up in 2 weeks due to the schedule   Lungs:  [x]   Clear to auscultation               []  Other:   Heart:    [x]   Regular rhythm             []  irregular rhythm    [x]   Previous H&P reviewed, patient examined and there are NO CHANGES                 []   Previous H&P reviewed, patient examined and there are changes noted.   [name], MD 11/17/201710:25 AM

## 2016-09-27 NOTE — Discharge Instructions (Signed)
1)  The drugs that you have been given will stay in your system until tomorrow so for the       next 24 hours you should not:  A. Drive an automobile  B. Make any legal decisions  C. Drink any alcoholic beverages  2)  You may resume your regular meals upon return home.  3)  A responsible adult must take you home.  Someone should stay with you for a few          hours, then be available by phone for the remainder of the treatment day.  4)  You May experience any of the following symptoms:  Headache, Nausea and a dry mouth (due to the medications you were given),  temporary memory loss and some confusion, or sore muscles (a warm bath  should help this).  If you you experience any of these symptoms let us know on                your return visit.  5)  Report any of the following: any acute discomfort, severe headache, or temperature        greater than 100.5 F.   Also report any unusual redness, swelling, drainage, or pain         at your IV site.    You may report Symptoms to:  [facility]          Phone: [phone], ECT Department           or Dr. Prescott Gum office [phone]  6)  Your next ECT Treatment is Friday, October 11, 2016.  We will call 2 days prior to your scheduled appointment for arrival times.  7)  Nothing to eat or drink after midnight the night before your procedure.  8)  Take lisinopril with a sip of water the morning of your procedure.  9)  Other Instructions: Call [phone] to cancel the morning of your procedure due         to illness or emergency.  10) We will call within 72 hours to assess how you are feeling.

## 2016-09-27 NOTE — Anesthesia Preprocedure Evaluation (Signed)
Anesthesia Evaluation  Patient identified by MRN, date of birth, ID band Patient awake    Reviewed: Allergy & Precautions, H&P , NPO status , Patient's Chart, lab work & pertinent test results  History of Anesthesia Complications Negative for: history of anesthetic complications  Airway Mallampati: II  TM Distance: >3 FB Neck ROM: full    Dental  (+) Poor Dentition, Chipped   Pulmonary sleep apnea , neg COPD,    Pulmonary exam normal breath sounds clear to auscultation- rhonchi (-) wheezing      Cardiovascular hypertension, Pt. on medications (-) CAD and (-) Past MI negative cardio ROS Normal cardiovascular exam Rhythm:Regular Rate:Normal - Systolic murmurs and - Diastolic murmurs    Neuro/Psych negative neurological ROS     GI/Hepatic negative GI ROS, Neg liver ROS, GERD  Medicated,  Endo/Other  diabetes, Type 2, Oral Hypoglycemic Agents  Renal/GU negative Renal ROS  negative genitourinary   Musculoskeletal   Abdominal (+) + obese,   Peds  Hematology negative hematology ROS (+)   Anesthesia Other Findings Past Medical History: No date: Depression No date: Diabetes mellitus without complication ([place]) 99991111: Diabetic peripheral neuropathy ([place]) 03/07/15: Diabetic peripheral neuropathy ([place]) 03/07/15: Diabetic peripheral neuropathy (H[facility]) No date: GERD (gastroesophageal reflux disease) 03/07/15: Hypercholesterolemia No date: Hypertension 03/07/15: Obesity 03/07/15: Personality disorder 03/07/15: Sinus tachycardia ([place])     Comment: history of 03/07/15: Suicidal thoughts Past Surgical History: 03/07/15: electroconvulsion therapy BMI    Body Mass Index:  36.44 kg/m     Reproductive/Obstetrics                             Anesthesia Physical  Anesthesia Plan  ASA: III  Anesthesia Plan: General   Post-op Pain Management:    Induction: Intravenous  Airway Management Planned:  Mask  Additional Equipment:   Intra-op Plan:   Post-operative Plan:   Informed Consent: I have reviewed the patients History and Physical, chart, labs and discussed the procedure including the risks, benefits and alternatives for the proposed anesthesia with the patient or authorized representative who has indicated his/her understanding and acceptance.   Dental Advisory Given  Plan Discussed with: CRNA and Anesthesiologist  Anesthesia Plan Comments:         Anesthesia Quick Evaluation

## 2016-09-30 NOTE — Anesthesia Postprocedure Evaluation (Signed)
Anesthesia Post Note  Patient: Melinda [name]  Procedure(s) Performed: * No procedures listed *  Patient location during evaluation: PACU Anesthesia Type: General Level of consciousness: awake and alert Pain management: pain level controlled Vital Signs Assessment: post-procedure vital signs reviewed and stable Respiratory status: spontaneous breathing, nonlabored ventilation, respiratory function stable and patient connected to nasal cannula oxygen Cardiovascular status: blood pressure returned to baseline and stable Postop Assessment: no signs of nausea or vomiting Anesthetic complications: no    Last Vitals:  Vitals:   09/27/16 1108 09/27/16 1119  BP: 116/76 119/84  Pulse: 92 89  Resp: (!) 21 18  Temp:      Last Pain:  Vitals:   09/27/16 1119  TempSrc:   PainSc: 3                  Martha Clan

## 2016-10-10 ENCOUNTER — Other Ambulatory Visit: Payer: Self-pay | Admitting: Psychiatry

## 2016-10-11 ENCOUNTER — Encounter
Admission: RE | Admit: 2016-10-11 | Discharge: 2016-10-11 | Disposition: A | Discharge status: Home / Self Care | Payer: Medicare PPO | Source: Ambulatory Visit | Attending: Psychiatry | Admitting: Psychiatry

## 2016-10-11 ENCOUNTER — Encounter: Payer: Self-pay | Admitting: Anesthesiology

## 2016-10-11 ENCOUNTER — Other Ambulatory Visit: Payer: Self-pay | Admitting: Psychiatry

## 2016-10-11 DIAGNOSIS — F329 Major depressive disorder, single episode, unspecified: Secondary | ICD-10-CM | POA: Insufficient documentation

## 2016-10-11 DIAGNOSIS — E119 Type 2 diabetes mellitus without complications: Secondary | ICD-10-CM | POA: Diagnosis not present

## 2016-10-11 DIAGNOSIS — F332 Major depressive disorder, recurrent severe without psychotic features: Secondary | ICD-10-CM | POA: Diagnosis not present

## 2016-10-11 DIAGNOSIS — I10 Essential (primary) hypertension: Secondary | ICD-10-CM | POA: Diagnosis not present

## 2016-10-11 LAB — GLUCOSE, CAPILLARY: Glucose-Capillary: 104 mg/dL — ABNORMAL HIGH (ref 65–99)

## 2016-10-11 LAB — POCT PREGNANCY, URINE: Preg Test, Ur: NEGATIVE

## 2016-10-11 MED ORDER — METHOHEXITAL SODIUM 100 MG/10ML IV SOSY
PREFILLED_SYRINGE | INTRAVENOUS | Status: DC | PRN
  Administered 2016-10-11: 70 mg via INTRAVENOUS

## 2016-10-11 MED ORDER — KETOROLAC TROMETHAMINE 30 MG/ML IJ SOLN
INTRAMUSCULAR | Status: AC
  Administered 2016-10-11: 30 mg via INTRAVENOUS
  Filled 2016-10-11: qty 1

## 2016-10-11 MED ORDER — SODIUM CHLORIDE 0.9 % IV SOLN
500.0000 mL | Freq: Once | INTRAVENOUS | Status: AC
  Administered 2016-10-11: 500 mL via INTRAVENOUS

## 2016-10-11 MED ORDER — GLYCOPYRROLATE 0.2 MG/ML IJ SOLN
0.4000 mg | Freq: Once | INTRAMUSCULAR | Status: AC
  Administered 2016-10-11: 0.4 mg via INTRAVENOUS

## 2016-10-11 MED ORDER — SUCCINYLCHOLINE CHLORIDE 200 MG/10ML IV SOSY
PREFILLED_SYRINGE | INTRAVENOUS | Status: DC | PRN
  Administered 2016-10-11: 100 mg via INTRAVENOUS

## 2016-10-11 MED ORDER — ESMOLOL HCL 100 MG/10ML IV SOLN
INTRAVENOUS | Status: DC | PRN
  Administered 2016-10-11: 10 mg via INTRAVENOUS

## 2016-10-11 MED ORDER — LISINOPRIL 10 MG PO TABS: 10.0000 mg | ORAL_TABLET | Freq: Every day | ORAL | 0 refills | Status: DC

## 2016-10-11 MED ORDER — GLYCOPYRROLATE 0.2 MG/ML IJ SOLN
INTRAMUSCULAR | Status: AC
  Filled 2016-10-11: qty 2

## 2016-10-11 MED ORDER — LABETALOL HCL 5 MG/ML IV SOLN
INTRAVENOUS | Status: DC | PRN
  Administered 2016-10-11: 20 mg via INTRAVENOUS

## 2016-10-11 MED ORDER — KETOROLAC TROMETHAMINE 30 MG/ML IJ SOLN
30.0000 mg | Freq: Once | INTRAMUSCULAR | Status: AC
  Administered 2016-10-11: 30 mg via INTRAVENOUS

## 2016-10-11 MED ORDER — SODIUM CHLORIDE 0.9 % IV SOLN
INTRAVENOUS | Status: DC | PRN
  Administered 2016-10-11: 10:00:00 via INTRAVENOUS

## 2016-10-11 NOTE — Anesthesia Preprocedure Evaluation (Signed)
Anesthesia Evaluation  Patient identified by MRN, date of birth, ID band Patient awake    Reviewed: Allergy & Precautions, H&P , NPO status , Patient's Chart, lab work & pertinent test results  History of Anesthesia Complications Negative for: history of anesthetic complications  Airway Mallampati: II  TM Distance: >3 FB Neck ROM: full    Dental  (+) Poor Dentition, Chipped   Pulmonary sleep apnea , neg COPD,    Pulmonary exam normal breath sounds clear to auscultation- rhonchi (-) wheezing      Cardiovascular hypertension, Pt. on medications (-) CAD and (-) Past MI negative cardio ROS Normal cardiovascular exam Rhythm:Regular Rate:Normal - Systolic murmurs and - Diastolic murmurs    Neuro/Psych Depression Bipolar Disorder negative neurological ROS     GI/Hepatic negative GI ROS, Neg liver ROS, GERD  Medicated,  Endo/Other  diabetes, Type 2, Oral Hypoglycemic Agents  Renal/GU negative Renal ROS  negative genitourinary   Musculoskeletal   Abdominal (+) + obese,   Peds  Hematology negative hematology ROS (+)   Anesthesia Other Findings Past Medical History: No date: Depression No date: Diabetes mellitus without complication ([place]) 99991111: Diabetic peripheral neuropathy ([place]) 03/07/15: Diabetic peripheral neuropathy ([place]) 03/07/15: Diabetic peripheral neuropathy ([facility]CC) No date: GERD (gastroesophageal reflux disease) 03/07/15: Hypercholesterolemia No date: Hypertension 03/07/15: Obesity 03/07/15: Personality disorder 03/07/15: Sinus tachycardia ([place])     Comment: history of 03/07/15: Suicidal thoughts Past Surgical History: 03/07/15: electroconvulsion therapy BMI    Body Mass Index:  36.44 kg/m     Reproductive/Obstetrics                             Anesthesia Physical  Anesthesia Plan  ASA: III  Anesthesia Plan: General   Post-op Pain Management:    Induction:  Intravenous  Airway Management Planned: Mask  Additional Equipment:   Intra-op Plan:   Post-operative Plan:   Informed Consent: I have reviewed the patients History and Physical, chart, labs and discussed the procedure including the risks, benefits and alternatives for the proposed anesthesia with the patient or authorized representative who has indicated his/her understanding and acceptance.   Dental Advisory Given  Plan Discussed with: CRNA and Anesthesiologist  Anesthesia Plan Comments:         Anesthesia Quick Evaluation

## 2016-10-11 NOTE — Transfer of Care (Signed)
Immediate Anesthesia Transfer of Care Note  Patient: Melinda [name]  Procedure(s) Performed: ECT  Patient Location: PACU  Anesthesia Type:General  Level of Consciousness: sedated  Airway & Oxygen Therapy: Patient Spontanous Breathing and Patient connected to face mask oxygen  Post-op Assessment: Report given to RN and Post -op Vital signs reviewed and stable  Post vital signs: Reviewed and stable  Last Vitals:  Vitals:   10/11/16 0830 10/11/16 1035  BP: (!) 142/92 134/84  Pulse: (!) 109 88  Resp: 18 18  Temp: 36.9 C 37 C    Last Pain:  Vitals:   10/11/16 0835  TempSrc:   PainSc: 1       Patients Stated Pain Goal: 0 ([id number])  Complications: No apparent anesthesia complications

## 2016-10-11 NOTE — Progress Notes (Signed)
As I think I mentioned in another note, I am going to agreed to [facility] request for a one time prescription of her lisinopril but I impressed upon her how crucial it is for her to find a primary care doctor. I would not like to do this again and I made it clear to her that I cannot be her primary care doctor.

## 2016-10-11 NOTE — Anesthesia Postprocedure Evaluation (Signed)
Anesthesia Post Note  Patient: Melinda [name]  Procedure(s) Performed: * No procedures listed *  Patient location during evaluation: PACU Anesthesia Type: General Level of consciousness: awake and alert Pain management: pain level controlled Vital Signs Assessment: post-procedure vital signs reviewed and stable Respiratory status: spontaneous breathing, nonlabored ventilation and respiratory function stable Cardiovascular status: blood pressure returned to baseline and stable Postop Assessment: no signs of nausea or vomiting Anesthetic complications: no    Last Vitals:  Vitals:   10/11/16 1055 10/11/16 1100  BP: 133/86 133/86  Pulse:  84  Resp:    Temp:      Last Pain:  Vitals:   10/11/16 1050  TempSrc:   PainSc: Asleep                 Latosha Gaylord

## 2016-10-11 NOTE — Procedures (Signed)
ECT SERVICES Physician's Interval Evaluation & Treatment Note  Patient Identification: Melinda [name] MRN:  [id number] Date of Evaluation:  10/11/2016 TX #: [id number]  MADRS:   MMSE:   P.E. Findings:  No change to physical exam vital signs stable  Psychiatric Interval Note:  Mood is depressed down irritable not psychotic not acutely suicidal  Subjective:  Patient is a 45 y.o. female seen for evaluation for Electroconvulsive Therapy. Feeling a little bit worse as she often does  Treatment Summary:   []   Right Unilateral             [x]  Bilateral   % Energy : 1.0 ms 35%   Impedance: 1900 ohms  Seizure Energy Index: 1686 V squared  Postictal Suppression Index: Less than 10%  Seizure Concordance Index: 62%  Medications  Pre Shock: Robinul 0.4 mg, Toradol 30 mg, labetalol 20 mg, esmolol 10 mg,'s Brevital 70 mg, succinylcholine 100 mg  Post Shock: None  Seizure Duration: 28 seconds by EMG 37 seconds by EEG   Comments: Unusually short and poorly organized seizure for this patient. We will just proceed forward and see if things continue to be adequate. Length was still fine.   Lungs:  [x]   Clear to auscultation               []  Other:   Heart:    [x]   Regular rhythm             []  irregular rhythm    [x]   Previous H&P reviewed, patient examined and there are NO CHANGES                 []   Previous H&P reviewed, patient examined and there are changes noted.   [name], MD 12/1/201710:23 AM

## 2016-10-11 NOTE — Discharge Instructions (Signed)
1)  The drugs that you have been given will stay in your system until tomorrow so for the       next 24 hours you should not:  A. Drive an automobile  B. Make any legal decisions  C. Drink any alcoholic beverages  2)  You may resume your regular meals upon return home.  3)  A responsible adult must take you home.  Someone should stay with you for a few          hours, then be available by phone for the remainder of the treatment day.  4)  You May experience any of the following symptoms:  Headache, Nausea and a dry mouth (due to the medications you were given),  temporary memory loss and some confusion, or sore muscles (a warm bath  should help this).  If you you experience any of these symptoms let us know on                your return visit.  5)  Report any of the following: any acute discomfort, severe headache, or temperature        greater than 100.5 F.   Also report any unusual redness, swelling, drainage, or pain         at your IV site.    You may report Symptoms to:  [place] at [facility]          Phone: [phone], ECT Department           or Dr. Prescott Gum office [phone]  6)  Your next ECT Treatment is Day Friday  Date October 18, 2016  We will call 2 days prior to your scheduled appointment for arrival times.  7)  Nothing to eat or drink after midnight the night before your procedure.  8)  Take Lisinopril     With a sip of water the morning of your procedure.  9)  Other Instructions: Call [phone] to cancel the morning of your procedure due         to illness or emergency.  10) We will call within 72 hours to assess how you are feeling.

## 2016-10-11 NOTE — H&P (Signed)
Melinda [name] is an 45 y.o. female.    Chief Complaint: Patient is feeling a little bit more down and depressed. More run down. HPI: History of recurrent severe depression usually maintained on a weekly ECT.  Past Medical History:  Diagnosis Date  . Depression   . Diabetes mellitus without complication ([place])   . Diabetic peripheral neuropathy ([place]) 03/07/15  . Diabetic peripheral neuropathy ([place]) 03/07/15  . Diabetic peripheral neuropathy ([place]) 03/07/15  . GERD (gastroesophageal reflux disease)   . Hypercholesterolemia 03/07/15  . Hypertension   . Obesity 03/07/15  . Personality disorder 03/07/15  . Sinus tachycardia 03/07/15   history of  . Suicidal thoughts 03/07/15    Past Surgical History:  Procedure Laterality Date  . electroconvulsion therapy  03/07/15    Family History  Problem Relation Age of Onset  . Hypertension Father   . Diabetes Mother    Social History:  reports that she has never smoked. She has never used smokeless tobacco. She reports that she does not drink alcohol or use drugs.  Allergies:  Allergies  Allergen Reactions  . Prednisone     Increases blood sugar     (Not in a hospital admission)  Results for orders placed or performed during the hospital encounter of 10/11/16 (from the past 48 hour(s))  Pregnancy, urine POC     Status: None   Collection Time: 10/11/16  8:29 AM  Result Value Ref Range   Preg Test, Ur NEGATIVE NEGATIVE    Comment:        THE SENSITIVITY OF THIS METHODOLOGY IS >24 mIU/mL   Glucose, capillary     Status: Abnormal   Collection Time: 10/11/16  8:38 AM  Result Value Ref Range   Glucose-Capillary 104 (H) 65 - 99 mg/dL   No results found.  Review of Systems  Constitutional: Negative.   HENT: Negative.   Eyes: Negative.   Respiratory: Negative.   Cardiovascular: Negative.   Gastrointestinal: Negative.   Musculoskeletal: Negative.   Skin: Negative.   Neurological: Negative.   Psychiatric/Behavioral: Positive for  depression. Negative for hallucinations, memory loss, substance abuse and suicidal ideas. The patient is nervous/anxious. The patient does not have insomnia.     Blood pressure (!) 142/92, pulse (!) 109, temperature 98.4 F (36.9 C), temperature source Oral, resp. rate 18, height 5\' 4"  (1.626 m), weight 93.9 kg (207 lb), last menstrual period 09/30/2016, SpO2 98 %. Physical Exam  Nursing note and vitals reviewed. Constitutional: She appears well-developed and well-nourished.  HENT:  Head: Normocephalic and atraumatic.  Eyes: Conjunctivae are normal. Pupils are equal, round, and reactive to light.  Neck: Normal range of motion.  Cardiovascular: Regular rhythm and normal heart sounds.   Respiratory: She is in respiratory distress.  GI: Soft.  Musculoskeletal: Normal range of motion.  Neurological: She is alert.  Skin: Skin is warm and dry.  Psychiatric: Judgment normal. Her affect is blunt. Her speech is delayed. She is slowed. Cognition and memory are normal. She expresses no suicidal plans.     Assessment/Plan Patient will be receiving ECT today and continue on her usual 1 week schedule. Also, she is requesting a refill of her lisinopril. We discussed this with my making it clear to her that it would be inappropriate for me to become her primary care doctor. I am willing to put in a 90 day prescription of it for now as I do get to regularly see her blood pressure but she was strongly informed that she  needed to get a primary care doctor within the next 3 months.  [name], MD 10/11/2016, 10:19 AM

## 2016-10-11 NOTE — Anesthesia Procedure Notes (Signed)
Performed by: Shyan Scalisi Pre-anesthesia Checklist: Patient identified, Emergency Drugs available, Suction available and Patient being monitored Patient Re-evaluated:Patient Re-evaluated prior to inductionOxygen Delivery Method: Circle system utilized Preoxygenation: Pre-oxygenation with 100% oxygen Intubation Type: IV induction Ventilation: Mask ventilation without difficulty and Mask ventilation throughout procedure Airway Equipment and Method: Bite block Placement Confirmation: positive ETCO2 Dental Injury: Teeth and Oropharynx as per pre-operative assessment        

## 2016-10-24 ENCOUNTER — Other Ambulatory Visit: Payer: Self-pay | Admitting: Psychiatry

## 2016-10-25 ENCOUNTER — Encounter: Payer: Self-pay | Admitting: Anesthesiology

## 2016-10-25 ENCOUNTER — Encounter
Admission: RE | Admit: 2016-10-25 | Discharge: 2016-10-25 | Disposition: A | Discharge status: Home / Self Care | Payer: Medicare PPO | Source: Ambulatory Visit | Attending: Psychiatry | Admitting: Psychiatry

## 2016-10-25 DIAGNOSIS — Z888 Allergy status to other drugs, medicaments and biological substances status: Secondary | ICD-10-CM | POA: Insufficient documentation

## 2016-10-25 DIAGNOSIS — K219 Gastro-esophageal reflux disease without esophagitis: Secondary | ICD-10-CM | POA: Insufficient documentation

## 2016-10-25 DIAGNOSIS — F609 Personality disorder, unspecified: Secondary | ICD-10-CM | POA: Insufficient documentation

## 2016-10-25 DIAGNOSIS — E1142 Type 2 diabetes mellitus with diabetic polyneuropathy: Secondary | ICD-10-CM | POA: Diagnosis not present

## 2016-10-25 DIAGNOSIS — F329 Major depressive disorder, single episode, unspecified: Secondary | ICD-10-CM | POA: Insufficient documentation

## 2016-10-25 DIAGNOSIS — Z833 Family history of diabetes mellitus: Secondary | ICD-10-CM | POA: Insufficient documentation

## 2016-10-25 DIAGNOSIS — I10 Essential (primary) hypertension: Secondary | ICD-10-CM | POA: Diagnosis not present

## 2016-10-25 DIAGNOSIS — F332 Major depressive disorder, recurrent severe without psychotic features: Secondary | ICD-10-CM

## 2016-10-25 DIAGNOSIS — E669 Obesity, unspecified: Secondary | ICD-10-CM | POA: Insufficient documentation

## 2016-10-25 DIAGNOSIS — Z8249 Family history of ischemic heart disease and other diseases of the circulatory system: Secondary | ICD-10-CM | POA: Insufficient documentation

## 2016-10-25 DIAGNOSIS — Z6835 Body mass index (BMI) 35.0-35.9, adult: Secondary | ICD-10-CM | POA: Insufficient documentation

## 2016-10-25 DIAGNOSIS — E7800 Pure hypercholesterolemia, unspecified: Secondary | ICD-10-CM | POA: Insufficient documentation

## 2016-10-25 DIAGNOSIS — F338 Other recurrent depressive disorders: Secondary | ICD-10-CM | POA: Diagnosis not present

## 2016-10-25 LAB — GLUCOSE, CAPILLARY
Glucose-Capillary: 89 mg/dL (ref 65–99)
Glucose-Capillary: 113 mg/dL — ABNORMAL HIGH (ref 65–99)

## 2016-10-25 MED ORDER — LABETALOL HCL 5 MG/ML IV SOLN
INTRAVENOUS | Status: DC | PRN
  Administered 2016-10-25: 20 mg via INTRAVENOUS

## 2016-10-25 MED ORDER — SODIUM CHLORIDE 0.9 % IV SOLN
INTRAVENOUS | Status: DC | PRN
  Administered 2016-10-25: 10:00:00 via INTRAVENOUS

## 2016-10-25 MED ORDER — KETOROLAC TROMETHAMINE 30 MG/ML IJ SOLN
INTRAMUSCULAR | Status: AC
  Administered 2016-10-25: 30 mg via INTRAVENOUS
  Filled 2016-10-25: qty 1

## 2016-10-25 MED ORDER — SUCCINYLCHOLINE CHLORIDE 200 MG/10ML IV SOSY
PREFILLED_SYRINGE | INTRAVENOUS | Status: DC | PRN
  Administered 2016-10-25: 100 mg via INTRAVENOUS

## 2016-10-25 MED ORDER — GLYCOPYRROLATE 0.2 MG/ML IJ SOLN
0.4000 mg | Freq: Once | INTRAMUSCULAR | Status: AC
  Administered 2016-10-25: 0.2 mg via INTRAVENOUS

## 2016-10-25 MED ORDER — METHOHEXITAL SODIUM 100 MG/10ML IV SOSY
PREFILLED_SYRINGE | INTRAVENOUS | Status: DC | PRN
  Administered 2016-10-25: 70 mg via INTRAVENOUS

## 2016-10-25 MED ORDER — KETOROLAC TROMETHAMINE 30 MG/ML IJ SOLN
30.0000 mg | Freq: Once | INTRAMUSCULAR | Status: AC
  Administered 2016-10-25: 30 mg via INTRAVENOUS

## 2016-10-25 MED ORDER — GLYCOPYRROLATE 0.2 MG/ML IJ SOLN
INTRAMUSCULAR | Status: AC
  Filled 2016-10-25: qty 1

## 2016-10-25 MED ORDER — ESMOLOL HCL 100 MG/10ML IV SOLN
INTRAVENOUS | Status: DC | PRN
  Administered 2016-10-25: 10 mg via INTRAVENOUS

## 2016-10-25 MED ORDER — GLYCOPYRROLATE 0.2 MG/ML IJ SOLN
INTRAMUSCULAR | Status: AC
  Administered 2016-10-25: 0.2 mg via INTRAVENOUS
  Filled 2016-10-25: qty 1

## 2016-10-25 MED ORDER — SODIUM CHLORIDE 0.9 % IV SOLN
500.0000 mL | Freq: Once | INTRAVENOUS | Status: AC
  Administered 2016-10-25: 1000 mL via INTRAVENOUS

## 2016-10-25 NOTE — Anesthesia Procedure Notes (Signed)
Date/Time: 10/25/2016 10:21 AM Performed by: Dionne Bucy Pre-anesthesia Checklist: Patient identified, Emergency Drugs available, Suction available and Patient being monitored Patient Re-evaluated:Patient Re-evaluated prior to inductionOxygen Delivery Method: Circle system utilized Preoxygenation: Pre-oxygenation with 100% oxygen Intubation Type: IV induction Ventilation: Mask ventilation without difficulty and Mask ventilation throughout procedure Airway Equipment and Method: Bite block Placement Confirmation: positive ETCO2 Dental Injury: Teeth and Oropharynx as per pre-operative assessment

## 2016-10-25 NOTE — Progress Notes (Signed)
75 fluids

## 2016-10-25 NOTE — Procedures (Signed)
ECT SERVICES Physician's Interval Evaluation & Treatment Note  Patient Identification: Melinda [name] MRN:  [id number] Date of Evaluation:  10/25/2016 TX #: [id number]  MADRS:   MMSE:   P.E. Findings:  No change  Psychiatric Interval Note:  No change  Subjective:  Patient is a 45 y.o. female seen for evaluation for Electroconvulsive Therapy. No new complaints  Treatment Summary:   []   Right Unilateral             []  Bilateral   % Energy : 1.0 ms 35%   Impedance: 1300 ohms  Seizure Energy Index: 21,595 V squared  Postictal Suppression Index:    Seizure Concordance Index: 88%  Medications  Pre Shock: Robinul 0.4 mg Toradol 30 mg esmolol 10 mg labetalol 20 mg Brevital 70 mg succinylcholine 100 mg  Post Shock:    Seizure Duration: 65 seconds by EMG 132 seconds by EEG   Comments: Follow-up one week   Lungs:  [x]   Clear to auscultation               []  Other:   Heart:    [x]   Regular rhythm             []  irregular rhythm    [x]   Previous H&P reviewed, patient examined and there are NO CHANGES                 []   Previous H&P reviewed, patient examined and there are changes noted.   [name], MD 12/15/201710:28 AM

## 2016-10-25 NOTE — H&P (Signed)
Melinda [name] is an 45 y.o. female.   Chief Complaint: Continued depression HPI: History of depression  Past Medical History:  Diagnosis Date  . Depression   . Diabetes mellitus without complication ([place])   . Diabetic peripheral neuropathy ([place]) 03/07/15  . Diabetic peripheral neuropathy ([place]) 03/07/15  . Diabetic peripheral neuropathy ([place]) 03/07/15  . GERD (gastroesophageal reflux disease)   . Hypercholesterolemia 03/07/15  . Hypertension   . Obesity 03/07/15  . Personality disorder 03/07/15  . Sinus tachycardia 03/07/15   history of  . Suicidal thoughts 03/07/15    Past Surgical History:  Procedure Laterality Date  . electroconvulsion therapy  03/07/15    Family History  Problem Relation Age of Onset  . Hypertension Father   . Diabetes Mother    Social History:  reports that she has never smoked. She has never used smokeless tobacco. She reports that she does not drink alcohol or use drugs.  Allergies:  Allergies  Allergen Reactions  . Prednisone     Increases blood sugar     (Not in a hospital admission)  Results for orders placed or performed during the hospital encounter of 10/25/16 (from the past 48 hour(s))  Glucose, capillary     Status: None   Collection Time: 10/25/16  9:01 AM  Result Value Ref Range   Glucose-Capillary 89 65 - 99 mg/dL   No results found.  Review of Systems  Constitutional: Negative.   HENT: Negative.   Eyes: Negative.   Respiratory: Negative.   Cardiovascular: Negative.   Gastrointestinal: Negative.   Musculoskeletal: Negative.   Skin: Negative.   Neurological: Negative.   Psychiatric/Behavioral: Positive for depression. Negative for hallucinations, memory loss, substance abuse and suicidal ideas. The patient is not nervous/anxious and does not have insomnia.     Blood pressure (!) 152/74, pulse 93, temperature 98.2 F (36.8 C), temperature source Oral, resp. rate 18, height 5\' 4"  (1.626 m), weight 92.5 kg (204 lb), last  menstrual period 09/30/2016, SpO2 100 %. Physical Exam  Nursing note and vitals reviewed. Constitutional: She appears well-developed and well-nourished.  HENT:  Head: Normocephalic and atraumatic.  Eyes: Conjunctivae are normal. Pupils are equal, round, and reactive to light.  Neck: Normal range of motion.  Cardiovascular: Normal heart sounds.   Respiratory: Effort normal.  GI: Soft.  Musculoskeletal: Normal range of motion.  Neurological: She is alert.  Skin: Skin is warm and dry.  Psychiatric: Judgment and thought content normal. Her speech is delayed. She is slowed. Cognition and memory are normal. She exhibits a depressed mood.     Assessment/Plan ECT today and continue weekly schedule  [name], MD 10/25/2016, 10:27 AM

## 2016-10-25 NOTE — Discharge Instructions (Signed)
1)  The drugs that you have been given will stay in your system until tomorrow so for the       next 24 hours you should not:  A. Drive an automobile  B. Make any legal decisions  C. Drink any alcoholic beverages  2)  You may resume your regular meals upon return home.  3)  A responsible adult must take you home.  Someone should stay with you for a few          hours, then be available by phone for the remainder of the treatment day.  4)  You May experience any of the following symptoms:  Headache, Nausea and a dry mouth (due to the medications you were given),  temporary memory loss and some confusion, or sore muscles (a warm bath  should help this).  If you you experience any of these symptoms let us know on                your return visit.  5)  Report any of the following: any acute discomfort, severe headache, or temperature        greater than 100.5 F.   Also report any unusual redness, swelling, drainage, or pain         at your IV site.    You may report Symptoms to:  [place] at [facility]          Phone: [phone], ECT Department           or Dr. Prescott Gum office [phone]  6)  Your next ECT Treatment is Day Friday  Date December 22nd  We will call 2 days prior to your scheduled appointment for arrival times.  7)  Nothing to eat or drink after midnight the night before your procedure.  8)  Take lisinopril    With a sip of water the morning of your procedure.  9)  Other Instructions: Call [phone] to cancel the morning of your procedure due         to illness or emergency.  10) We will call within 72 hours to assess how you are feeling.

## 2016-10-25 NOTE — Anesthesia Preprocedure Evaluation (Signed)
Anesthesia Evaluation  Patient identified by MRN, date of birth, ID band Patient awake    Reviewed: Allergy & Precautions, H&P , NPO status , Patient's Chart, lab work & pertinent test results  History of Anesthesia Complications Negative for: history of anesthetic complications  Airway Mallampati: II  TM Distance: >3 FB Neck ROM: full    Dental  (+) Poor Dentition, Chipped   Pulmonary sleep apnea , neg COPD,    Pulmonary exam normal breath sounds clear to auscultation- rhonchi (-) wheezing      Cardiovascular hypertension, Pt. on medications (-) CAD and (-) Past MI negative cardio ROS Normal cardiovascular exam Rhythm:Regular Rate:Normal - Systolic murmurs and - Diastolic murmurs    Neuro/Psych negative neurological ROS     GI/Hepatic negative GI ROS, Neg liver ROS, GERD  Medicated,  Endo/Other  diabetes, Type 2, Oral Hypoglycemic Agents  Renal/GU negative Renal ROS  negative genitourinary   Musculoskeletal   Abdominal (+) + obese,   Peds  Hematology negative hematology ROS (+)   Anesthesia Other Findings Past Medical History: No date: Depression No date: Diabetes mellitus without complication ([place]) [id number]: Diabetic peripheral neuropathy ([place]) 03/07/15: Diabetic peripheral neuropathy ([place]) 03/07/15: Diabetic peripheral neuropathy ([facility]) No date: GERD (gastroesophageal reflux disease) 03/07/15: Hypercholesterolemia No date: Hypertension 03/07/15: Obesity 03/07/15: Personality disorder 03/07/15: Sinus tachycardia ([place])     Comment: history of 03/07/15: Suicidal thoughts Past Surgical History: 03/07/15: electroconvulsion therapy BMI    Body Mass Index:  36.44 kg/m     Reproductive/Obstetrics                             Anesthesia Physical  Anesthesia Plan  ASA: III  Anesthesia Plan: General   Post-op Pain Management:    Induction: Intravenous  Airway Management Planned:  Mask  Additional Equipment:   Intra-op Plan:   Post-operative Plan:   Informed Consent: I have reviewed the patients History and Physical, chart, labs and discussed the procedure including the risks, benefits and alternatives for the proposed anesthesia with the patient or authorized representative who has indicated his/her understanding and acceptance.   Dental Advisory Given  Plan Discussed with: CRNA and Anesthesiologist  Anesthesia Plan Comments:         Anesthesia Quick Evaluation

## 2016-10-25 NOTE — Transfer of Care (Signed)
Immediate Anesthesia Transfer of Care Note  Patient: Melinda [name]  Procedure(s) Performed: ECT  Patient Location: PACU  Anesthesia Type:General  Level of Consciousness: sedated  Airway & Oxygen Therapy: Patient Spontanous Breathing and Patient connected to face mask oxygen  Post-op Assessment: Report given to RN and Post -op Vital signs reviewed and stable  Post vital signs: Reviewed and stable  Last Vitals:  Vitals:   10/25/16 0829  BP: (!) 152/74  Pulse: 93  Resp: 18  Temp: 36.8 C    Last Pain:  Vitals:   10/25/16 0829  TempSrc: Oral         Complications: No apparent anesthesia complications

## 2016-10-25 NOTE — Anesthesia Postprocedure Evaluation (Signed)
Anesthesia Post Note  Patient: Melinda [name]  Procedure(s) Performed: * No procedures listed *  Patient location during evaluation: PACU Anesthesia Type: General Level of consciousness: awake and alert Pain management: pain level controlled Vital Signs Assessment: post-procedure vital signs reviewed and stable Respiratory status: spontaneous breathing, nonlabored ventilation, respiratory function stable and patient connected to nasal cannula oxygen Cardiovascular status: blood pressure returned to baseline and stable Postop Assessment: no signs of nausea or vomiting Anesthetic complications: no    Last Vitals:  Vitals:   10/25/16 1105 10/25/16 1116  BP: 111/60 126/77  Pulse: 88 82  Resp: (!) 27 18  Temp: 36.1 C     Last Pain:  Vitals:   10/25/16 0829  TempSrc: Oral                 Martha Clan

## 2016-11-01 ENCOUNTER — Encounter: Payer: Self-pay | Admitting: Anesthesiology

## 2016-11-01 ENCOUNTER — Other Ambulatory Visit: Payer: Self-pay | Admitting: Psychiatry

## 2016-11-01 ENCOUNTER — Encounter (HOSPITAL_BASED_OUTPATIENT_CLINIC_OR_DEPARTMENT_OTHER)
Admission: RE | Admit: 2016-11-01 | Discharge: 2016-11-01 | Disposition: A | Discharge status: Home / Self Care | Payer: Medicare PPO | Source: Ambulatory Visit | Attending: Psychiatry | Admitting: Psychiatry

## 2016-11-01 DIAGNOSIS — F332 Major depressive disorder, recurrent severe without psychotic features: Secondary | ICD-10-CM | POA: Diagnosis not present

## 2016-11-01 DIAGNOSIS — F329 Major depressive disorder, single episode, unspecified: Secondary | ICD-10-CM | POA: Diagnosis not present

## 2016-11-01 LAB — GLUCOSE, CAPILLARY: Glucose-Capillary: 101 mg/dL — ABNORMAL HIGH (ref 65–99)

## 2016-11-01 MED ORDER — SODIUM CHLORIDE 0.9 % IV SOLN
INTRAVENOUS | Status: DC | PRN
  Administered 2016-11-01: 11:00:00 via INTRAVENOUS

## 2016-11-01 MED ORDER — ESMOLOL HCL 100 MG/10ML IV SOLN
INTRAVENOUS | Status: AC
  Filled 2016-11-01: qty 10

## 2016-11-01 MED ORDER — ESMOLOL HCL 100 MG/10ML IV SOLN
INTRAVENOUS | Status: DC | PRN
  Administered 2016-11-01: 10 mg via INTRAVENOUS

## 2016-11-01 MED ORDER — SUCCINYLCHOLINE CHLORIDE 20 MG/ML IJ SOLN
INTRAMUSCULAR | Status: DC | PRN
  Administered 2016-11-01: 100 mg via INTRAVENOUS

## 2016-11-01 MED ORDER — METHOHEXITAL SODIUM 0.5 G IJ SOLR
INTRAMUSCULAR | Status: AC
  Filled 2016-11-01: qty 500

## 2016-11-01 MED ORDER — SUCCINYLCHOLINE CHLORIDE 200 MG/10ML IV SOSY
PREFILLED_SYRINGE | INTRAVENOUS | Status: AC
  Filled 2016-11-01: qty 10

## 2016-11-01 MED ORDER — SODIUM CHLORIDE 0.9 % IV SOLN
500.0000 mL | Freq: Once | INTRAVENOUS | Status: AC
  Administered 2016-11-01: 1000 mL via INTRAVENOUS

## 2016-11-01 MED ORDER — GLYCOPYRROLATE 0.2 MG/ML IJ SOLN
INTRAMUSCULAR | Status: AC
  Administered 2016-11-01: 0.4 mg via INTRAVENOUS
  Filled 2016-11-01: qty 2

## 2016-11-01 MED ORDER — LABETALOL HCL 5 MG/ML IV SOLN
INTRAVENOUS | Status: DC | PRN
  Administered 2016-11-01: 20 mg via INTRAVENOUS

## 2016-11-01 MED ORDER — KETOROLAC TROMETHAMINE 30 MG/ML IJ SOLN
30.0000 mg | Freq: Once | INTRAMUSCULAR | Status: AC
  Administered 2016-11-01: 30 mg via INTRAVENOUS

## 2016-11-01 MED ORDER — GLYCOPYRROLATE 0.2 MG/ML IJ SOLN
0.4000 mg | Freq: Once | INTRAMUSCULAR | Status: AC
  Administered 2016-11-01: 0.4 mg via INTRAVENOUS

## 2016-11-01 MED ORDER — METHOHEXITAL SODIUM 100 MG/10ML IV SOSY
PREFILLED_SYRINGE | INTRAVENOUS | Status: DC | PRN
  Administered 2016-11-01: 70 mg via INTRAVENOUS

## 2016-11-01 MED ORDER — KETOROLAC TROMETHAMINE 30 MG/ML IJ SOLN
INTRAMUSCULAR | Status: AC
  Administered 2016-11-01: 30 mg via INTRAVENOUS
  Filled 2016-11-01: qty 1

## 2016-11-01 MED ORDER — LABETALOL HCL 5 MG/ML IV SOLN
INTRAVENOUS | Status: AC
  Filled 2016-11-01: qty 4

## 2016-11-01 NOTE — Discharge Instructions (Addendum)
1)  The drugs that you have been given will stay in your system until tomorrow so for the       next 24 hours you should not:  A. Drive an automobile  B. Make any legal decisions  C. Drink any alcoholic beverages  2)  You may resume your regular meals upon return home.  3)  A responsible adult must take you home.  Someone should stay with you for a few          hours, then be available by phone for the remainder of the treatment day.  4)  You May experience any of the following symptoms:  Headache, Nausea and a dry mouth (due to the medications you were given),  temporary memory loss and some confusion, or sore muscles (a warm bath  should help this).  If you you experience any of these symptoms let us know on                your return visit.  5)  Report any of the following: any acute discomfort, severe headache, or temperature        greater than 100.5 F.   Also report any unusual redness, swelling, drainage, or pain         at your IV site.    You may report Symptoms to:  [place] at [facility]          Phone: [phone], ECT Department           or Dr. Prescott Gum office [phone]  6)  Your next ECT Treatment is Day Friday  Date January 5th  We will call 2 days prior to your scheduled appointment for arrival times.  7)  Nothing to eat or drink after midnight the night before your procedure.  8)  Take lisinopril    With a sip of water the morning of your procedure.  9)  Other Instructions: Call [phone] to cancel the morning of your procedure due         to illness or emergency.  10) We will call within 72 hours to assess how you are feeling.

## 2016-11-01 NOTE — Transfer of Care (Signed)
Immediate Anesthesia Transfer of Care Note  Patient: Melinda [name]  Procedure(s) Performed: * No procedures listed *  Patient Location: PACU  Anesthesia Type:General  Level of Consciousness: patient cooperative  Airway & Oxygen Therapy: Patient Spontanous Breathing and Patient connected to face mask oxygen  Post-op Assessment: Report given to RN and Post -op Vital signs reviewed and stable  Post vital signs: Reviewed and stable  Last Vitals:  Vitals:   11/01/16 0700 11/01/16 1050  BP: (!) 163/82 (!) 155/90  Pulse: 97 (!) 101  Resp: 18 18  Temp: 36.9 C     Last Pain:  Vitals:   11/01/16 0854  TempSrc:   PainSc: 0-No pain         Complications: No apparent anesthesia complications

## 2016-11-01 NOTE — Procedures (Signed)
ECT SERVICES Physician's Interval Evaluation & Treatment Note  Patient Identification: Melinda [name] MRN:  [id number] Date of Evaluation:  11/01/2016 TX #: [id number]  MADRS: 20  MMSE: 29  P.E. Findings:  No change to usual findings except blood pressure pretreatment was a little bit up.  Psychiatric Interval Note:  Chronic depression little bit worse this week stress from the holiday.  Subjective:  Patient is a 45 y.o. female seen for evaluation for Electroconvulsive Therapy. "Depressed"  Treatment Summary:   []   Right Unilateral             []  Bilateral   % Energy : 1.0 ms, 35%   Impedance: 2260 ohms  Seizure Energy Index: 6787 V squared  Postictal Suppression Index: 76%  Seizure Concordance Index: 97%  Medications  Pre Shock: Robinul 0.4 mg, Toradol 30 mg, esmolol 10 mg, labetalol 20 mg, Brevital 70 mg, succinylcholine 100 mg  Post Shock:    Seizure Duration: 49 seconds by EMG, 96 seconds by EEG   Comments: Doing reasonably well. Follow-up on the fifth because of holiday schedule.   Lungs:  [x]   Clear to auscultation               []  Other:   Heart:    [x]   Regular rhythm             []  irregular rhythm    [x]   Previous H&P reviewed, patient examined and there are NO CHANGES                 []   Previous H&P reviewed, patient examined and there are changes noted.   [name], MD 12/22/201710:29 AM

## 2016-11-01 NOTE — Anesthesia Preprocedure Evaluation (Signed)
Anesthesia Evaluation  Patient identified by MRN, date of birth, ID band Patient awake    Reviewed: Allergy & Precautions, H&P , NPO status , Patient's Chart, lab work & pertinent test results  History of Anesthesia Complications Negative for: history of anesthetic complications  Airway Mallampati: II  TM Distance: >3 FB Neck ROM: full    Dental  (+) Poor Dentition, Chipped   Pulmonary sleep apnea , neg COPD,    Pulmonary exam normal breath sounds clear to auscultation       Cardiovascular hypertension, Pt. on medications (-) CAD and (-) Past MI negative cardio ROS Normal cardiovascular exam Rhythm:regular Rate:Normal     Neuro/Psych PSYCHIATRIC DISORDERS Depression Bipolar Disorder  Neuromuscular disease negative neurological ROS     GI/Hepatic negative GI ROS, Neg liver ROS, GERD  Medicated,  Endo/Other  diabetes, Type 2, Oral Hypoglycemic Agents  Renal/GU negative Renal ROS  negative genitourinary   Musculoskeletal   Abdominal (+) + obese,   Peds  Hematology negative hematology ROS (+)   Anesthesia Other Findings Past Medical History: No date: Depression No date: Diabetes mellitus without complication ([facility]) 03/07/15: Diabetic peripheral neuropathy ([facility]) 03/07/15: Diabetic peripheral neuropathy ([facility]) 03/07/15: Diabetic peripheral neuropathy ([facility]) No date: GERD (gastroesophageal reflux disease) 03/07/15: Hypercholesterolemia No date: Hypertension 03/07/15: Obesity 03/07/15: Personality disorder 03/07/15: Sinus tachycardia ([facility])     Comment: history of 03/07/15: Suicidal thoughts Past Surgical History: 03/07/15: electroconvulsion therapy BMI    Body Mass Index:  36.44 kg/m     Reproductive/Obstetrics                             Anesthesia Physical  Anesthesia Plan  ASA: III  Anesthesia Plan: General   Post-op Pain Management:    Induction: Intravenous  Airway Management  Planned: Mask  Additional Equipment:   Intra-op Plan:   Post-operative Plan:   Informed Consent: I have reviewed the patients History and Physical, chart, labs and discussed the procedure including the risks, benefits and alternatives for the proposed anesthesia with the patient or authorized representative who has indicated his/her understanding and acceptance.   Dental Advisory Given  Plan Discussed with: CRNA and Anesthesiologist  Anesthesia Plan Comments:         Anesthesia Quick Evaluation  

## 2016-11-01 NOTE — Anesthesia Postprocedure Evaluation (Signed)
Anesthesia Post Note  Patient: Melinda [name]  Procedure(s) Performed: * No procedures listed *  Patient location during evaluation: PACU Anesthesia Type: General Level of consciousness: awake and alert Pain management: pain level controlled Vital Signs Assessment: post-procedure vital signs reviewed and stable Respiratory status: spontaneous breathing, nonlabored ventilation, respiratory function stable and patient connected to nasal cannula oxygen Cardiovascular status: blood pressure returned to baseline and stable Postop Assessment: no signs of nausea or vomiting Anesthetic complications: no     Last Vitals:  Vitals:   11/01/16 1126 11/01/16 1136  BP:  100/62  Pulse: 85 76  Resp: 20 18  Temp:      Last Pain:  Vitals:   11/01/16 1136  TempSrc:   PainSc: 0-No pain                 Precious Haws [place]

## 2016-11-01 NOTE — H&P (Signed)
Melinda [name] is an 45 y.o. female.   Chief Complaint: Ongoing chronic depression and anxiety HPI: Chronic depression and help to stay stable with regular ECT.  Past Medical History:  Diagnosis Date  . Depression   . Diabetes mellitus without complication ([place])   . Diabetic peripheral neuropathy ([place]) 03/07/15  . Diabetic peripheral neuropathy ([place]) 03/07/15  . Diabetic peripheral neuropathy ([place]) 03/07/15  . GERD (gastroesophageal reflux disease)   . Hypercholesterolemia 03/07/15  . Hypertension   . Obesity 03/07/15  . Personality disorder 03/07/15  . Sinus tachycardia 03/07/15   history of  . Suicidal thoughts 03/07/15    Past Surgical History:  Procedure Laterality Date  . electroconvulsion therapy  03/07/15    Family History  Problem Relation Age of Onset  . Hypertension Father   . Diabetes Mother    Social History:  reports that she has never smoked. She has never used smokeless tobacco. She reports that she does not drink alcohol or use drugs.  Allergies:  Allergies  Allergen Reactions  . Prednisone     Increases blood sugar     (Not in a hospital admission)  Results for orders placed or performed during the hospital encounter of 11/01/16 (from the past 48 hour(s))  Glucose, capillary     Status: Abnormal   Collection Time: 11/01/16  8:56 AM  Result Value Ref Range   Glucose-Capillary 101 (H) 65 - 99 mg/dL   No results found.  Review of Systems  Constitutional: Negative.   HENT: Negative.   Eyes: Negative.   Respiratory: Negative.   Cardiovascular: Negative.   Gastrointestinal: Negative.   Musculoskeletal: Negative.   Skin: Negative.   Neurological: Negative.   Psychiatric/Behavioral: Positive for depression. Negative for hallucinations, memory loss, substance abuse and suicidal ideas. The patient is not nervous/anxious and does not have insomnia.     Blood pressure (!) 163/82, pulse 97, temperature 98.4 F (36.9 C), temperature source Oral, resp. rate  18, height 5\' 4"  (1.626 m), weight 94.3 kg (208 lb), last menstrual period 09/30/2016, SpO2 100 %. Physical Exam  Nursing note and vitals reviewed. Constitutional: She appears well-developed and well-nourished.  HENT:  Head: Normocephalic and atraumatic.  Eyes: Conjunctivae are normal. Pupils are equal, round, and reactive to light.  Neck: Normal range of motion.  Cardiovascular: Regular rhythm and normal heart sounds.   Respiratory: Effort normal. No respiratory distress.  GI: Soft.  Musculoskeletal: Normal range of motion.  Neurological: She is alert.  Skin: Skin is warm and dry.  Psychiatric: Judgment normal. Her affect is blunt. Her speech is delayed. She is slowed. Thought content is not paranoid. Cognition and memory are normal. She expresses no homicidal and no suicidal ideation.     Assessment/Plan Follow-up in 2 weeks because of Christmas schedule after treatment today. No change to current treatment plan.  [name], MD 11/01/2016, 10:27 AM

## 2016-11-05 DIAGNOSIS — E782 Mixed hyperlipidemia: Secondary | ICD-10-CM | POA: Diagnosis not present

## 2016-11-05 DIAGNOSIS — E119 Type 2 diabetes mellitus without complications: Secondary | ICD-10-CM | POA: Diagnosis not present

## 2016-11-05 DIAGNOSIS — E663 Overweight: Secondary | ICD-10-CM | POA: Diagnosis not present

## 2016-11-13 ENCOUNTER — Telehealth: Payer: Self-pay | Admitting: *Deleted

## 2016-11-14 ENCOUNTER — Other Ambulatory Visit: Payer: Self-pay | Admitting: Psychiatry

## 2016-11-15 ENCOUNTER — Ambulatory Visit
Admission: RE | Admit: 2016-11-15 | Discharge: 2016-11-15 | Disposition: A | Discharge status: Home / Self Care | Payer: Medicare PPO | Source: Ambulatory Visit | Attending: Psychiatry | Admitting: Psychiatry

## 2016-11-15 ENCOUNTER — Encounter: Payer: Self-pay | Admitting: Anesthesiology

## 2016-11-15 DIAGNOSIS — E1142 Type 2 diabetes mellitus with diabetic polyneuropathy: Secondary | ICD-10-CM | POA: Diagnosis not present

## 2016-11-15 DIAGNOSIS — F332 Major depressive disorder, recurrent severe without psychotic features: Secondary | ICD-10-CM

## 2016-11-15 DIAGNOSIS — F648 Other gender identity disorders: Secondary | ICD-10-CM | POA: Diagnosis not present

## 2016-11-15 DIAGNOSIS — F339 Major depressive disorder, recurrent, unspecified: Secondary | ICD-10-CM | POA: Insufficient documentation

## 2016-11-15 DIAGNOSIS — F329 Major depressive disorder, single episode, unspecified: Secondary | ICD-10-CM | POA: Diagnosis not present

## 2016-11-15 DIAGNOSIS — E7800 Pure hypercholesterolemia, unspecified: Secondary | ICD-10-CM | POA: Insufficient documentation

## 2016-11-15 DIAGNOSIS — K219 Gastro-esophageal reflux disease without esophagitis: Secondary | ICD-10-CM | POA: Insufficient documentation

## 2016-11-15 DIAGNOSIS — I10 Essential (primary) hypertension: Secondary | ICD-10-CM | POA: Insufficient documentation

## 2016-11-15 DIAGNOSIS — E669 Obesity, unspecified: Secondary | ICD-10-CM | POA: Diagnosis not present

## 2016-11-15 DIAGNOSIS — E119 Type 2 diabetes mellitus without complications: Secondary | ICD-10-CM | POA: Diagnosis not present

## 2016-11-15 LAB — POCT PREGNANCY, URINE: Preg Test, Ur: NEGATIVE

## 2016-11-15 LAB — GLUCOSE, CAPILLARY: Glucose-Capillary: 96 mg/dL (ref 65–99)

## 2016-11-15 MED ORDER — ESMOLOL HCL 100 MG/10ML IV SOLN
INTRAVENOUS | Status: DC | PRN
  Administered 2016-11-15: 10 mg via INTRAVENOUS

## 2016-11-15 MED ORDER — ESMOLOL HCL 100 MG/10ML IV SOLN
INTRAVENOUS | Status: AC
  Filled 2016-11-15: qty 10

## 2016-11-15 MED ORDER — LABETALOL HCL 5 MG/ML IV SOLN
INTRAVENOUS | Status: DC | PRN
  Administered 2016-11-15: 20 mg via INTRAVENOUS

## 2016-11-15 MED ORDER — KETOROLAC TROMETHAMINE 30 MG/ML IJ SOLN
INTRAMUSCULAR | Status: AC
  Administered 2016-11-15: 30 mg via INTRAVENOUS
  Filled 2016-11-15: qty 1

## 2016-11-15 MED ORDER — METHOHEXITAL SODIUM 100 MG/10ML IV SOSY
PREFILLED_SYRINGE | INTRAVENOUS | Status: DC | PRN
  Administered 2016-11-15: 70 mg via INTRAVENOUS

## 2016-11-15 MED ORDER — GLYCOPYRROLATE 0.2 MG/ML IJ SOLN
INTRAMUSCULAR | Status: AC
  Administered 2016-11-15: 0.4 mg via INTRAVENOUS
  Filled 2016-11-15: qty 2

## 2016-11-15 MED ORDER — SUCCINYLCHOLINE CHLORIDE 200 MG/10ML IV SOSY
PREFILLED_SYRINGE | INTRAVENOUS | Status: AC
  Filled 2016-11-15: qty 10

## 2016-11-15 MED ORDER — SODIUM CHLORIDE 0.9 % IV SOLN
500.0000 mL | Freq: Once | INTRAVENOUS | Status: AC
  Administered 2016-11-15: 500 mL via INTRAVENOUS

## 2016-11-15 MED ORDER — GLYCOPYRROLATE 0.2 MG/ML IJ SOLN
0.4000 mg | Freq: Once | INTRAMUSCULAR | Status: AC
  Administered 2016-11-15: 0.4 mg via INTRAVENOUS

## 2016-11-15 MED ORDER — ONDANSETRON HCL 4 MG/2ML IJ SOLN
INTRAMUSCULAR | Status: AC
  Filled 2016-11-15: qty 2

## 2016-11-15 MED ORDER — SUCCINYLCHOLINE CHLORIDE 200 MG/10ML IV SOSY
PREFILLED_SYRINGE | INTRAVENOUS | Status: DC | PRN
  Administered 2016-11-15: 100 mg via INTRAVENOUS

## 2016-11-15 MED ORDER — KETOROLAC TROMETHAMINE 30 MG/ML IJ SOLN
30.0000 mg | Freq: Once | INTRAMUSCULAR | Status: AC
  Administered 2016-11-15: 30 mg via INTRAVENOUS

## 2016-11-15 MED ORDER — LABETALOL HCL 5 MG/ML IV SOLN
INTRAVENOUS | Status: AC
  Filled 2016-11-15: qty 4

## 2016-11-15 MED ORDER — METHOHEXITAL SODIUM 0.5 G IJ SOLR
INTRAMUSCULAR | Status: AC
  Filled 2016-11-15: qty 500

## 2016-11-15 MED ORDER — SODIUM CHLORIDE 0.9 % IV SOLN
INTRAVENOUS | Status: DC | PRN
  Administered 2016-11-15: 10:00:00 via INTRAVENOUS

## 2016-11-15 NOTE — H&P (Signed)
Melinda [name] is an 46 y.o. female.   Chief Complaint: No specific new complaint. Had some transient thoughts about cutting earlier in the week. Mood chronically dysphoric but no worse than usual. HPI: Chronic severe recurrent depression stable with ECT scheduled.  Past Medical History:  Diagnosis Date  . Depression   . Diabetes mellitus without complication ([place])   . Diabetic peripheral neuropathy ([place]) 03/07/15  . Diabetic peripheral neuropathy ([place]) 03/07/15  . Diabetic peripheral neuropathy ([place]) 03/07/15  . GERD (gastroesophageal reflux disease)   . Hypercholesterolemia 03/07/15  . Hypertension   . Obesity 03/07/15  . Personality disorder 03/07/15  . Sinus tachycardia 03/07/15   history of  . Suicidal thoughts 03/07/15    Past Surgical History:  Procedure Laterality Date  . electroconvulsion therapy  03/07/15    Family History  Problem Relation Age of Onset  . Hypertension Father   . Diabetes Mother    Social History:  reports that she has never smoked. She has never used smokeless tobacco. She reports that she does not drink alcohol or use drugs.  Allergies:  Allergies  Allergen Reactions  . Prednisone     Increases blood sugar     (Not in a hospital admission)  Results for orders placed or performed during the hospital encounter of 11/15/16 (from the past 48 hour(s))  Glucose, capillary     Status: None   Collection Time: 11/15/16  9:13 AM  Result Value Ref Range   Glucose-Capillary 96 65 - 99 mg/dL  Pregnancy, urine POC     Status: None   Collection Time: 11/15/16  9:14 AM  Result Value Ref Range   Preg Test, Ur NEGATIVE NEGATIVE    Comment:        THE SENSITIVITY OF THIS METHODOLOGY IS >24 mIU/mL    No results found.  Review of Systems  Constitutional: Negative.   HENT: Negative.   Eyes: Negative.   Respiratory: Negative.   Cardiovascular: Negative.   Gastrointestinal: Negative.   Musculoskeletal: Negative.   Skin: Negative.   Neurological:  Negative.   Psychiatric/Behavioral: Negative.     Blood pressure (!) 158/96, pulse 91, temperature 97.6 F (36.4 C), temperature source Oral, resp. rate 17, height 5\' 4"  (1.626 m), weight 93 kg (205 lb), last menstrual period 11/03/2016, SpO2 100 %. Physical Exam  Nursing note and vitals reviewed. Constitutional: She appears well-developed and well-nourished.  HENT:  Head: Normocephalic and atraumatic.  Eyes: Conjunctivae are normal. Pupils are equal, round, and reactive to light.  Neck: Normal range of motion.  Cardiovascular: Regular rhythm and normal heart sounds.   Respiratory: Effort normal. No respiratory distress.  GI: Soft.  Musculoskeletal: Normal range of motion.  Neurological: She is alert.  Skin: Skin is warm and dry.  Psychiatric: Her behavior is normal. Judgment and thought content normal. Her affect is blunt. Her speech is delayed. Cognition and memory are normal.     Assessment/Plan We discussed the schedule today my pointing out that I think that it would be worth trying a change to every 2 weeks treatments. She feels uncertain and anxious about this and is not willing to make a decision right now. Follow-up next week and continue the discussion.  [name], MD 11/15/2016, 10:20 AM

## 2016-11-15 NOTE — Anesthesia Procedure Notes (Signed)
Performed by: Tiffany Calmes Pre-anesthesia Checklist: Patient identified, Emergency Drugs available, Suction available and Patient being monitored Patient Re-evaluated:Patient Re-evaluated prior to inductionOxygen Delivery Method: Circle system utilized Preoxygenation: Pre-oxygenation with 100% oxygen Intubation Type: IV induction Ventilation: Mask ventilation without difficulty and Mask ventilation throughout procedure Airway Equipment and Method: Bite block Placement Confirmation: positive ETCO2 Dental Injury: Teeth and Oropharynx as per pre-operative assessment        

## 2016-11-15 NOTE — Discharge Instructions (Signed)
1)  The drugs that you have been given will stay in your system until tomorrow so for the       next 24 hours you should not:  A. Drive an automobile  B. Make any legal decisions  C. Drink any alcoholic beverages  2)  You may resume your regular meals upon return home.  3)  A responsible adult must take you home.  Someone should stay with you for a few          hours, then be available by phone for the remainder of the treatment day.  4)  You May experience any of the following symptoms:  Headache, Nausea and a dry mouth (due to the medications you were given),  temporary memory loss and some confusion, or sore muscles (a warm bath  should help this).  If you you experience any of these symptoms let us know on                your return visit.  5)  Report any of the following: any acute discomfort, severe headache, or temperature        greater than 100.5 F.   Also report any unusual redness, swelling, drainage, or pain         at your IV site.    You may report Symptoms to:  [place] at [facility]          Phone: [phone], ECT Department           or Dr. Prescott Gum office [phone]  6)  Your next ECT Treatment is Day Friday  Date Nov 22, 2016   We will call 2 days prior to your scheduled appointment for arrival times.  7)  Nothing to eat or drink after midnight the night before your procedure.  8)  Take ...    With a sip of water the morning of your procedure.  9)  Other Instructions: Call [phone] to cancel the morning of your procedure due         to illness or emergency.  10) We will call within 72 hours to assess how you are feeling.

## 2016-11-15 NOTE — Anesthesia Postprocedure Evaluation (Signed)
Anesthesia Post Note  Patient: Melinda [name]  Procedure(s) Performed: * No procedures listed *  Patient location during evaluation: PACU Anesthesia Type: General Level of consciousness: awake and alert Pain management: pain level controlled Vital Signs Assessment: post-procedure vital signs reviewed and stable Respiratory status: spontaneous breathing, nonlabored ventilation, respiratory function stable and patient connected to nasal cannula oxygen Cardiovascular status: blood pressure returned to baseline and stable Postop Assessment: no signs of nausea or vomiting Anesthetic complications: no     Last Vitals:  Vitals:   11/15/16 1059 11/15/16 1117  BP: 110/81 114/65  Pulse: 96 85  Resp: (!) 28 20  Temp: 36.7 C 37.1 C    Last Pain:  Vitals:   11/15/16 1117  TempSrc: Oral  PainSc:                  Precious Haws Krishna Dancel

## 2016-11-15 NOTE — Anesthesia Preprocedure Evaluation (Signed)
Anesthesia Evaluation  Patient identified by MRN, date of birth, ID band Patient awake    Reviewed: Allergy & Precautions, H&P , NPO status , Patient's Chart, lab work & pertinent test results  History of Anesthesia Complications Negative for: history of anesthetic complications  Airway Mallampati: II  TM Distance: >3 FB Neck ROM: full    Dental  (+) Poor Dentition, Chipped   Pulmonary sleep apnea , neg COPD,    Pulmonary exam normal breath sounds clear to auscultation       Cardiovascular hypertension, Pt. on medications (-) CAD and (-) Past MI negative cardio ROS Normal cardiovascular exam Rhythm:regular Rate:Normal     Neuro/Psych PSYCHIATRIC DISORDERS Depression Bipolar Disorder  Neuromuscular disease negative neurological ROS     GI/Hepatic negative GI ROS, Neg liver ROS, GERD  Medicated,  Endo/Other  diabetes, Type 2, Oral Hypoglycemic Agents  Renal/GU negative Renal ROS  negative genitourinary   Musculoskeletal   Abdominal (+) + obese,   Peds  Hematology negative hematology ROS (+)   Anesthesia Other Findings Past Medical History: No date: Depression No date: Diabetes mellitus without complication ([facility]) 03/07/15: Diabetic peripheral neuropathy ([facility]) 03/07/15: Diabetic peripheral neuropathy ([facility]) 03/07/15: Diabetic peripheral neuropathy ([facility]) No date: GERD (gastroesophageal reflux disease) 03/07/15: Hypercholesterolemia No date: Hypertension 03/07/15: Obesity 03/07/15: Personality disorder 03/07/15: Sinus tachycardia ([facility])     Comment: history of 03/07/15: Suicidal thoughts Past Surgical History: 03/07/15: electroconvulsion therapy BMI    Body Mass Index:  36.44 kg/m     Reproductive/Obstetrics                             Anesthesia Physical  Anesthesia Plan  ASA: III  Anesthesia Plan: General   Post-op Pain Management:    Induction: Intravenous  Airway Management  Planned: Mask  Additional Equipment:   Intra-op Plan:   Post-operative Plan:   Informed Consent: I have reviewed the patients History and Physical, chart, labs and discussed the procedure including the risks, benefits and alternatives for the proposed anesthesia with the patient or authorized representative who has indicated his/her understanding and acceptance.   Dental Advisory Given  Plan Discussed with: CRNA and Anesthesiologist  Anesthesia Plan Comments:         Anesthesia Quick Evaluation  

## 2016-11-15 NOTE — Transfer of Care (Signed)
Immediate Anesthesia Transfer of Care Note  Patient: Melinda [name]  Procedure(s) Performed: ECT  Patient Location: PACU  Anesthesia Type:General  Level of Consciousness: sedated  Airway & Oxygen Therapy: Patient Spontanous Breathing and Patient connected to face mask oxygen  Post-op Assessment: Report given to RN and Post -op Vital signs reviewed and stable  Post vital signs: Reviewed and stable  Last Vitals:  Vitals:   11/15/16 0905 11/15/16 1039  BP: (!) 158/96 (!) (P) 153/83  Pulse: 91 99  Resp: 17 16  Temp: 36.4 C (P) 37.1 C    Last Pain:  Vitals:   11/15/16 0906  TempSrc:   PainSc: 0-No pain         Complications: No apparent anesthesia complications

## 2016-11-15 NOTE — Procedures (Signed)
ECT SERVICES Physician's Interval Evaluation & Treatment Note  Patient Identification: Melinda [name] MRN:  [id number] Date of Evaluation:  11/15/2016 TX #: [id number]  MADRS:   MMSE:   P.E. Findings:  No change to physical exam. Vitals stable. Lungs clear heart normal.  Psychiatric Interval Note:  Chronic dysphoria and no psychosis no acute suicidal ideation.  Subjective:  Patient is a 46 y.o. female seen for evaluation for Electroconvulsive Therapy. No specific new complaint. Some thoughts of cutting earlier in the week without acting on it.  Treatment Summary:   []   Right Unilateral             [x]  Bilateral   % Energy : 1.0 ms 35%   Impedance: 1400 ohms  Seizure Energy Index: 11,196 V squared  Postictal Suppression Index: 62%  Seizure Concordance Index: 98%  Medications  Pre Shock: Robinul 0.4 mg labetalol 20 mg esmolol 10 mg Toradol 30 mg Brevital 70 mg succinylcholine 100 mg  Post Shock:    Seizure Duration: 54 seconds by EMG 104 seconds by EEG   Comments: Follow-up one week. See other note. I am talking with her about switching to biweekly but she is hesitant.   Lungs:  [x]   Clear to auscultation               []  Other:   Heart:    [x]   Regular rhythm             []  irregular rhythm    [x]   Previous H&P reviewed, patient examined and there are NO CHANGES                 []   Previous H&P reviewed, patient examined and there are changes noted.   [name], MD 1/5/201810:22 AM

## 2016-11-21 ENCOUNTER — Other Ambulatory Visit: Payer: Self-pay | Admitting: Psychiatry

## 2016-11-22 ENCOUNTER — Encounter: Payer: Self-pay | Admitting: Anesthesiology

## 2016-11-22 ENCOUNTER — Ambulatory Visit
Admission: RE | Admit: 2016-11-22 | Discharge: 2016-11-22 | Disposition: A | Discharge status: Home / Self Care | Payer: Medicare PPO | Source: Ambulatory Visit | Attending: Psychiatry | Admitting: Psychiatry

## 2016-11-22 DIAGNOSIS — E1142 Type 2 diabetes mellitus with diabetic polyneuropathy: Secondary | ICD-10-CM | POA: Insufficient documentation

## 2016-11-22 DIAGNOSIS — Z6835 Body mass index (BMI) 35.0-35.9, adult: Secondary | ICD-10-CM | POA: Diagnosis not present

## 2016-11-22 DIAGNOSIS — F3189 Other bipolar disorder: Secondary | ICD-10-CM | POA: Diagnosis not present

## 2016-11-22 DIAGNOSIS — E669 Obesity, unspecified: Secondary | ICD-10-CM | POA: Diagnosis not present

## 2016-11-22 DIAGNOSIS — E7800 Pure hypercholesterolemia, unspecified: Secondary | ICD-10-CM | POA: Insufficient documentation

## 2016-11-22 DIAGNOSIS — K219 Gastro-esophageal reflux disease without esophagitis: Secondary | ICD-10-CM | POA: Diagnosis not present

## 2016-11-22 DIAGNOSIS — F329 Major depressive disorder, single episode, unspecified: Secondary | ICD-10-CM | POA: Diagnosis not present

## 2016-11-22 DIAGNOSIS — F332 Major depressive disorder, recurrent severe without psychotic features: Secondary | ICD-10-CM

## 2016-11-22 DIAGNOSIS — I10 Essential (primary) hypertension: Secondary | ICD-10-CM | POA: Insufficient documentation

## 2016-11-22 DIAGNOSIS — E119 Type 2 diabetes mellitus without complications: Secondary | ICD-10-CM | POA: Diagnosis not present

## 2016-11-22 LAB — POCT PREGNANCY, URINE: Preg Test, Ur: NEGATIVE

## 2016-11-22 LAB — GLUCOSE, CAPILLARY: Glucose-Capillary: 108 mg/dL — ABNORMAL HIGH (ref 65–99)

## 2016-11-22 MED ORDER — KETOROLAC TROMETHAMINE 30 MG/ML IJ SOLN
INTRAMUSCULAR | Status: AC
  Administered 2016-11-22: 30 mg via INTRAVENOUS
  Filled 2016-11-22: qty 1

## 2016-11-22 MED ORDER — ESMOLOL HCL 100 MG/10ML IV SOLN
INTRAVENOUS | Status: AC
  Filled 2016-11-22: qty 10

## 2016-11-22 MED ORDER — LABETALOL HCL 5 MG/ML IV SOLN
INTRAVENOUS | Status: AC
Start: 2016-11-22 — End: 2016-11-22
  Filled 2016-11-22: qty 4

## 2016-11-22 MED ORDER — KETOROLAC TROMETHAMINE 30 MG/ML IJ SOLN
30.0000 mg | Freq: Once | INTRAMUSCULAR | Status: AC
  Administered 2016-11-22: 30 mg via INTRAVENOUS

## 2016-11-22 MED ORDER — SUCCINYLCHOLINE CHLORIDE 200 MG/10ML IV SOSY
PREFILLED_SYRINGE | INTRAVENOUS | Status: AC
Start: 2016-11-22 — End: 2016-11-22
  Filled 2016-11-22: qty 10

## 2016-11-22 MED ORDER — METHOHEXITAL SODIUM 100 MG/10ML IV SOSY
PREFILLED_SYRINGE | INTRAVENOUS | Status: DC | PRN
  Administered 2016-11-22: 70 mg via INTRAVENOUS

## 2016-11-22 MED ORDER — SODIUM CHLORIDE 0.9 % IV SOLN
500.0000 mL | Freq: Once | INTRAVENOUS | Status: AC
  Administered 2016-11-22: 500 mL via INTRAVENOUS

## 2016-11-22 MED ORDER — ESMOLOL HCL 100 MG/10ML IV SOLN
INTRAVENOUS | Status: DC | PRN
  Administered 2016-11-22: 20 mg via INTRAVENOUS

## 2016-11-22 MED ORDER — METHOHEXITAL SODIUM 0.5 G IJ SOLR
INTRAMUSCULAR | Status: AC
Start: 2016-11-22 — End: 2016-11-22
  Filled 2016-11-22: qty 500

## 2016-11-22 MED ORDER — GLYCOPYRROLATE 0.2 MG/ML IJ SOLN
INTRAMUSCULAR | Status: AC
  Administered 2016-11-22: 0.4 mg via INTRAVENOUS
  Filled 2016-11-22: qty 2

## 2016-11-22 MED ORDER — LABETALOL HCL 5 MG/ML IV SOLN
INTRAVENOUS | Status: DC | PRN
  Administered 2016-11-22: 20 mg via INTRAVENOUS

## 2016-11-22 MED ORDER — SUCCINYLCHOLINE CHLORIDE 20 MG/ML IJ SOLN
INTRAMUSCULAR | Status: DC | PRN
  Administered 2016-11-22: 100 mg via INTRAVENOUS

## 2016-11-22 MED ORDER — GLYCOPYRROLATE 0.2 MG/ML IJ SOLN
0.4000 mg | Freq: Once | INTRAMUSCULAR | Status: AC
  Administered 2016-11-22: 0.4 mg via INTRAVENOUS

## 2016-11-22 NOTE — Procedures (Signed)
ECT SERVICES Physician's Interval Evaluation & Treatment Note  Patient Identification: Melinda [name] MRN:  [id number] Date of Evaluation:  11/22/2016 TX #: [id number]  MADRS:   MMSE:   P.E. Findings:  No change to physical exam. Blood pressure normal pretreatment. Heart and lungs normal.  Psychiatric Interval Note:  Mood is stable. Slightly dysphoric but not severely depressed no real change from baseline.  Subjective:  Patient is a 46 y.o. female seen for evaluation for Electroconvulsive Therapy. No specific new complaint  Treatment Summary:   []   Right Unilateral             [x]  Bilateral   % Energy : 1.0 ms 35%   Impedance: 1540 ohms  Seizure Energy Index: 7858 V squared  Postictal Suppression Index: No reading obtained  Seizure Concordance Index: 91%  Medications  Pre Shock: Robinul 0.4 mg Toradol 30 mg labetalol 20 mg esmolol 10 mg Brevital 70 mg succinylcholine 100 mg  Post Shock: None  Seizure Duration: 47 seconds by EMG 97 seconds by EEG   Comments: Computer did not make a good reading of the end of the seizure but it was fairly clear from visual observation. She did seem a little more fidgety today even though we used exactly the same medication as always.   Lungs:  [x]   Clear to auscultation               []  Other:   Heart:    [x]   Regular rhythm             []  irregular rhythm    [x]   Previous H&P reviewed, patient examined and there are NO CHANGES                 []   Previous H&P reviewed, patient examined and there are changes noted.   [name], MD 1/12/201810:31 AM

## 2016-11-22 NOTE — Anesthesia Postprocedure Evaluation (Signed)
Anesthesia Post Note  Patient: Melinda [name]  Procedure(s) Performed: * No procedures listed *  Patient location during evaluation: PACU Anesthesia Type: General Level of consciousness: awake and alert Pain management: pain level controlled Vital Signs Assessment: post-procedure vital signs reviewed and stable Respiratory status: spontaneous breathing, nonlabored ventilation and respiratory function stable Cardiovascular status: blood pressure returned to baseline and stable Postop Assessment: no signs of nausea or vomiting Anesthetic complications: no     Last Vitals:  Vitals:   11/22/16 1040 11/22/16 1047  BP: 128/72 115/67  Pulse: (!) 101 98  Resp: 16 (!) 23  Temp:      Last Pain:  Vitals:   11/22/16 0934  TempSrc:   PainSc: 0-No pain                 Deion Forgue

## 2016-11-22 NOTE — H&P (Signed)
Melinda [name] is an 46 y.o. female.   Chief Complaint: No specific chief complaint. Mood is doing well overall. No physical problems. HPI: Chronic severe depression maintained on weekly ECT. Low functioning but stable  Past Medical History:  Diagnosis Date  . Depression   . Diabetes mellitus without complication ([place])   . Diabetic peripheral neuropathy ([place]) 03/07/15  . Diabetic peripheral neuropathy ([place]) 03/07/15  . Diabetic peripheral neuropathy ([place]) 03/07/15  . GERD (gastroesophageal reflux disease)   . Hypercholesterolemia 03/07/15  . Hypertension   . Obesity 03/07/15  . Personality disorder 03/07/15  . Sinus tachycardia 03/07/15   history of  . Suicidal thoughts 03/07/15    Past Surgical History:  Procedure Laterality Date  . electroconvulsion therapy  03/07/15    Family History  Problem Relation Age of Onset  . Hypertension Father   . Diabetes Mother    Social History:  reports that she has never smoked. She has never used smokeless tobacco. She reports that she does not drink alcohol or use drugs.  Allergies:  Allergies  Allergen Reactions  . Prednisone     Increases blood sugar     (Not in a hospital admission)  Results for orders placed or performed during the hospital encounter of 11/22/16 (from the past 48 hour(s))  Pregnancy, urine POC     Status: None   Collection Time: 11/22/16  9:29 AM  Result Value Ref Range   Preg Test, Ur NEGATIVE NEGATIVE    Comment:        THE SENSITIVITY OF THIS METHODOLOGY IS >24 mIU/mL   Glucose, capillary     Status: Abnormal   Collection Time: 11/22/16  9:32 AM  Result Value Ref Range   Glucose-Capillary 108 (H) 65 - 99 mg/dL   No results found.  Review of Systems  Constitutional: Negative.   HENT: Negative.   Eyes: Negative.   Respiratory: Negative.   Cardiovascular: Negative.   Gastrointestinal: Negative.   Musculoskeletal: Negative.   Skin: Negative.   Neurological: Negative.   Psychiatric/Behavioral:  Negative.     Blood pressure (!) 140/92, pulse (!) 103, temperature 97.3 F (36.3 C), resp. rate (!) 21, height 5\' 4"  (1.626 m), weight 94.3 kg (208 lb), last menstrual period 11/03/2016, SpO2 98 %. Physical Exam  Nursing note and vitals reviewed. Constitutional: She appears well-developed and well-nourished.  HENT:  Head: Normocephalic and atraumatic.  Eyes: Conjunctivae are normal. Pupils are equal, round, and reactive to light.  Neck: Normal range of motion.  Cardiovascular: Normal rate, regular rhythm and normal heart sounds.   Respiratory: Effort normal and breath sounds normal. No respiratory distress.  GI: Soft.  Musculoskeletal: Normal range of motion.  Neurological: She is alert.  Skin: Skin is warm and dry.  Psychiatric: She has a normal mood and affect. Her behavior is normal. Judgment and thought content normal.     Assessment/Plan We are continuing weekly treatment although I'm working on trying to see if she will agree to switching to every other week treatment.  [name], MD 11/22/2016, 10:30 AM

## 2016-11-22 NOTE — Transfer of Care (Signed)
Immediate Anesthesia Transfer of Care Note  Patient: Melinda [name]  Procedure(s) Performed: * No procedures listed *  Patient Location: PACU  Anesthesia Type:General  Level of Consciousness: patient cooperative and lethargic  Airway & Oxygen Therapy: Patient Spontanous Breathing and Patient connected to face mask oxygen  Post-op Assessment: Report given to RN and Post -op Vital signs reviewed and stable  Post vital signs: Reviewed and stable  Last Vitals:  Vitals:   11/22/16 0919 11/22/16 1026  BP: (!) 165/88 (!) 140/92  Pulse: (!) 106 (!) 103  Resp: 18 (!) 21  Temp: 37.3 C 36.3 C    Last Pain:  Vitals:   11/22/16 0934  TempSrc:   PainSc: 0-No pain         Complications: No apparent anesthesia complications

## 2016-11-22 NOTE — Discharge Instructions (Addendum)
1)  The drugs that you have been given will stay in your system until tomorrow so for the       next 24 hours you should not:  A. Drive an automobile  B. Make any legal decisions  C. Drink any alcoholic beverages  2)  You may resume your regular meals upon return home.  3)  A responsible adult must take you home.  Someone should stay with you for a few          hours, then be available by phone for the remainder of the treatment day.  4)  You May experience any of the following symptoms:  Headache, Nausea and a dry mouth (due to the medications you were given),  temporary memory loss and some confusion, or sore muscles (a warm bath  should help this).  If you you experience any of these symptoms let us know on                your return visit.  5)  Report any of the following: any acute discomfort, severe headache, or temperature        greater than 100.5 F.   Also report any unusual redness, swelling, drainage, or pain         at your IV site.    You may report Symptoms to:  [place] at [facility]          Phone: [phone], ECT Department           or Dr. Prescott Gum office [phone]  6)  Your next ECT Treatment is Day  Friday  Date  November 29, 2016 at 0830  We will call 2 days prior to your scheduled appointment for arrival times.  7)  Nothing to eat or drink after midnight the night before your procedure.  8)  Take.     With a sip of water the morning of your procedure.  9)  Other Instructions: Call [phone] to cancel the morning of your procedure due         to illness or emergency.  10) We will call within 72 hours to assess how you are feeling.

## 2016-11-22 NOTE — Anesthesia Preprocedure Evaluation (Signed)
Anesthesia Evaluation  Patient identified by MRN, date of birth, ID band Patient awake    Reviewed: Allergy & Precautions, H&P , NPO status , Patient's Chart, lab work & pertinent test results  History of Anesthesia Complications Negative for: history of anesthetic complications  Airway Mallampati: II  TM Distance: >3 FB Neck ROM: full    Dental  (+) Poor Dentition, Chipped   Pulmonary sleep apnea , neg COPD,    Pulmonary exam normal breath sounds clear to auscultation- rhonchi (-) wheezing      Cardiovascular hypertension, Pt. on medications (-) CAD and (-) Past MI negative cardio ROS Normal cardiovascular exam Rhythm:Regular Rate:Normal - Systolic murmurs and - Diastolic murmurs    Neuro/Psych Depression Bipolar Disorder negative neurological ROS     GI/Hepatic negative GI ROS, Neg liver ROS, GERD  Medicated,  Endo/Other  diabetes, Type 2, Oral Hypoglycemic Agents  Renal/GU negative Renal ROS  negative genitourinary   Musculoskeletal   Abdominal (+) + obese,   Peds  Hematology negative hematology ROS (+)   Anesthesia Other Findings Past Medical History: No date: Depression No date: Diabetes mellitus without complication ([place]) [id number]: Diabetic peripheral neuropathy ([place]) 03/07/15: Diabetic peripheral neuropathy ([place]) 03/07/15: Diabetic peripheral neuropathy ([facility]) No date: GERD (gastroesophageal reflux disease) 03/07/15: Hypercholesterolemia No date: Hypertension 03/07/15: Obesity 03/07/15: Personality disorder 03/07/15: Sinus tachycardia ([place])     Comment: history of 03/07/15: Suicidal thoughts Past Surgical History: 03/07/15: electroconvulsion therapy BMI    Body Mass Index:  36.44 kg/m     Reproductive/Obstetrics                             Anesthesia Physical  Anesthesia Plan  ASA: III  Anesthesia Plan: General   Post-op Pain Management:    Induction:  Intravenous  Airway Management Planned: Mask  Additional Equipment:   Intra-op Plan:   Post-operative Plan:   Informed Consent: I have reviewed the patients History and Physical, chart, labs and discussed the procedure including the risks, benefits and alternatives for the proposed anesthesia with the patient or authorized representative who has indicated his/her understanding and acceptance.   Dental Advisory Given  Plan Discussed with: CRNA and Anesthesiologist  Anesthesia Plan Comments:         Anesthesia Quick Evaluation

## 2016-11-29 ENCOUNTER — Encounter: Payer: Self-pay | Admitting: Anesthesiology

## 2016-11-29 ENCOUNTER — Other Ambulatory Visit: Payer: Self-pay | Admitting: Psychiatry

## 2016-11-29 ENCOUNTER — Encounter
Admission: RE | Admit: 2016-11-29 | Discharge: 2016-11-29 | Disposition: A | Discharge status: Home / Self Care | Payer: Medicare PPO | Source: Ambulatory Visit | Attending: Psychiatry | Admitting: Psychiatry

## 2016-11-29 DIAGNOSIS — F338 Other recurrent depressive disorders: Secondary | ICD-10-CM | POA: Diagnosis not present

## 2016-11-29 DIAGNOSIS — F329 Major depressive disorder, single episode, unspecified: Secondary | ICD-10-CM | POA: Insufficient documentation

## 2016-11-29 DIAGNOSIS — F332 Major depressive disorder, recurrent severe without psychotic features: Secondary | ICD-10-CM

## 2016-11-29 DIAGNOSIS — F319 Bipolar disorder, unspecified: Secondary | ICD-10-CM | POA: Diagnosis not present

## 2016-11-29 DIAGNOSIS — E119 Type 2 diabetes mellitus without complications: Secondary | ICD-10-CM | POA: Diagnosis not present

## 2016-11-29 LAB — GLUCOSE, CAPILLARY: Glucose-Capillary: 110 mg/dL — ABNORMAL HIGH (ref 65–99)

## 2016-11-29 LAB — POCT PREGNANCY, URINE: Preg Test, Ur: NEGATIVE

## 2016-11-29 MED ORDER — METHOHEXITAL SODIUM 100 MG/10ML IV SOSY
PREFILLED_SYRINGE | INTRAVENOUS | Status: DC | PRN
  Administered 2016-11-29: 70 mg via INTRAVENOUS

## 2016-11-29 MED ORDER — LABETALOL HCL 5 MG/ML IV SOLN
INTRAVENOUS | Status: AC
  Filled 2016-11-29: qty 4

## 2016-11-29 MED ORDER — LABETALOL HCL 5 MG/ML IV SOLN
INTRAVENOUS | Status: DC | PRN
  Administered 2016-11-29: 20 mg via INTRAVENOUS

## 2016-11-29 MED ORDER — GLYCOPYRROLATE 0.2 MG/ML IJ SOLN
0.4000 mg | Freq: Once | INTRAMUSCULAR | Status: AC
  Administered 2016-11-29: 0.4 mg via INTRAVENOUS

## 2016-11-29 MED ORDER — SUCCINYLCHOLINE CHLORIDE 200 MG/10ML IV SOSY
PREFILLED_SYRINGE | INTRAVENOUS | Status: AC
Start: 2016-11-29 — End: 2016-11-29
  Filled 2016-11-29: qty 10

## 2016-11-29 MED ORDER — SODIUM CHLORIDE 0.9 % IV SOLN
INTRAVENOUS | Status: DC | PRN
  Administered 2016-11-29: 10:00:00 via INTRAVENOUS

## 2016-11-29 MED ORDER — ESMOLOL HCL 100 MG/10ML IV SOLN
INTRAVENOUS | Status: DC | PRN
Start: 2016-11-29 — End: 2016-11-29
  Administered 2016-11-29: 20 mg via INTRAVENOUS

## 2016-11-29 MED ORDER — METHOHEXITAL SODIUM 0.5 G IJ SOLR
INTRAMUSCULAR | Status: AC
  Filled 2016-11-29: qty 500

## 2016-11-29 MED ORDER — KETOROLAC TROMETHAMINE 30 MG/ML IJ SOLN
30.0000 mg | Freq: Once | INTRAMUSCULAR | Status: AC
  Administered 2016-11-29: 30 mg via INTRAVENOUS

## 2016-11-29 MED ORDER — SUCCINYLCHOLINE CHLORIDE 200 MG/10ML IV SOSY
PREFILLED_SYRINGE | INTRAVENOUS | Status: DC | PRN
  Administered 2016-11-29: 100 mg via INTRAVENOUS

## 2016-11-29 MED ORDER — SODIUM CHLORIDE 0.9 % IV SOLN
500.0000 mL | Freq: Once | INTRAVENOUS | Status: AC
  Administered 2016-11-29: 1000 mL via INTRAVENOUS

## 2016-11-29 MED ORDER — GLYCOPYRROLATE 0.2 MG/ML IJ SOLN
INTRAMUSCULAR | Status: AC
  Administered 2016-11-29: 0.4 mg via INTRAVENOUS
  Filled 2016-11-29: qty 2

## 2016-11-29 MED ORDER — KETOROLAC TROMETHAMINE 30 MG/ML IJ SOLN
INTRAMUSCULAR | Status: AC
  Administered 2016-11-29: 30 mg via INTRAVENOUS
  Filled 2016-11-29: qty 1

## 2016-11-29 MED ORDER — ESMOLOL HCL 100 MG/10ML IV SOLN
INTRAVENOUS | Status: AC
  Filled 2016-11-29: qty 10

## 2016-11-29 NOTE — Procedures (Signed)
ECT SERVICES Physician's Interval Evaluation & Treatment Note  Patient Identification: Melinda [name] MRN:  [id number] Date of Evaluation:  11/29/2016 TX #: [id number]  MADRS:   MMSE:   P.E. Findings:  No change from baseline  Psychiatric Interval Note:  Slightly more depressed no suicidal  Subjective:  Patient is a 46 y.o. female seen for evaluation for Electroconvulsive Therapy. No suicidal  Treatment Summary:   []   Right Unilateral             [x]  Bilateral   % Energy : 1.0 ms 35%   Impedance: 2160 ohms  Seizure Energy Index: No reading  Postictal Suppression Index: No reading  Seizure Concordance Index: 73%  Medications  Pre Shock: Robinul 0.4 mg Toradol 30 mg labetalol 20 mg esmolol 10 mg Brevital 70 mg succinylcholine 100 mg  Post Shock: None  Seizure Duration: 36 seconds by EMG approximately 64 seconds by EEG poor reading   Comments: Follow-up one week   Lungs:  [x]   Clear to auscultation               []  Other:   Heart:    [x]   Regular rhythm             []  irregular rhythm    [x]   Previous H&P reviewed, patient examined and there are NO CHANGES                 []   Previous H&P reviewed, patient examined and there are changes noted.   [name], MD 1/19/201810:24 AM

## 2016-11-29 NOTE — H&P (Signed)
Melinda [name] is an 46 y.o. female.   Chief Complaint: Chronic depression. About the same. Feels maybe a little worse this week. No psychosis no active suicidal plan HPI: History of recurrent severe depression with maintenance ECT  Past Medical History:  Diagnosis Date  . Depression   . Diabetes mellitus without complication ([place])   . Diabetic peripheral neuropathy ([place]) 03/07/15  . Diabetic peripheral neuropathy ([place]) 03/07/15  . Diabetic peripheral neuropathy ([place]) 03/07/15  . GERD (gastroesophageal reflux disease)   . Hypercholesterolemia 03/07/15  . Hypertension   . Obesity 03/07/15  . Personality disorder 03/07/15  . Sinus tachycardia 03/07/15   history of  . Suicidal thoughts 03/07/15    Past Surgical History:  Procedure Laterality Date  . electroconvulsion therapy  03/07/15    Family History  Problem Relation Age of Onset  . Hypertension Father   . Diabetes Mother    Social History:  reports that she has never smoked. She has never used smokeless tobacco. She reports that she does not drink alcohol or use drugs.  Allergies:  Allergies  Allergen Reactions  . Prednisone     Increases blood sugar     (Not in a hospital admission)  Results for orders placed or performed during the hospital encounter of 11/29/16 (from the past 48 hour(s))  Glucose, capillary     Status: Abnormal   Collection Time: 11/29/16  9:11 AM  Result Value Ref Range   Glucose-Capillary 110 (H) 65 - 99 mg/dL  Pregnancy, urine POC     Status: None   Collection Time: 11/29/16  9:26 AM  Result Value Ref Range   Preg Test, Ur NEGATIVE NEGATIVE    Comment:        THE SENSITIVITY OF THIS METHODOLOGY IS >24 mIU/mL    No results found.  Review of Systems  Constitutional: Negative.   HENT: Negative.   Eyes: Negative.   Respiratory: Negative.   Cardiovascular: Negative.   Gastrointestinal: Negative.   Musculoskeletal: Negative.   Skin: Negative.   Neurological: Negative.    Psychiatric/Behavioral: Positive for depression. Negative for hallucinations, memory loss, substance abuse and suicidal ideas. The patient is not nervous/anxious and does not have insomnia.     Blood pressure (!) 157/81, pulse 100, temperature 97.8 F (36.6 C), temperature source Oral, resp. rate 18, height 5\' 4"  (1.626 m), weight 95.3 kg (210 lb), last menstrual period 11/03/2016, SpO2 98 %. Physical Exam  Constitutional: She appears well-developed and well-nourished.  HENT:  Head: Normocephalic and atraumatic.  Eyes: Conjunctivae are normal. Pupils are equal, round, and reactive to light.  Neck: Normal range of motion.  Cardiovascular: Normal heart sounds.   Respiratory: Effort normal.  GI: Soft.  Musculoskeletal: Normal range of motion.  Neurological: She is alert.  Skin: Skin is warm and dry.  Psychiatric: Judgment normal. Her speech is delayed. She is slowed. Thought content is not paranoid. She exhibits a depressed mood. She expresses no suicidal ideation. She exhibits normal recent memory.     Assessment/Plan Treatment today in follow-up in 1 week as usual schedule.  [name], MD 11/29/2016, 10:22 AM

## 2016-11-29 NOTE — Transfer of Care (Signed)
Immediate Anesthesia Transfer of Care Note  Patient: Melinda [name]  Procedure(s) Performed: ECT  Patient Location: PACU  Anesthesia Type:General  Level of Consciousness: sedated  Airway & Oxygen Therapy: Patient Spontanous Breathing and Patient connected to face mask oxygen  Post-op Assessment: Report given to RN and Post -op Vital signs reviewed and stable  Post vital signs: Reviewed and stable  Last Vitals:  Vitals:   11/29/16 0900 11/29/16 1040  BP: (!) 157/81 128/78  Pulse: 100 90  Resp: 18 20  Temp: 36.6 C (P) 37 C    Last Pain:  Vitals:   11/29/16 1040  TempSrc:   PainSc: (P) 0-No pain         Complications: No apparent anesthesia complications

## 2016-11-29 NOTE — Anesthesia Post-op Follow-up Note (Cosign Needed)
Anesthesia QCDR form completed.        

## 2016-11-29 NOTE — Anesthesia Preprocedure Evaluation (Signed)
Anesthesia Evaluation  Patient identified by MRN, date of birth, ID band Patient awake    Reviewed: Allergy & Precautions, H&P , NPO status , Patient's Chart, lab work & pertinent test results  History of Anesthesia Complications Negative for: history of anesthetic complications  Airway Mallampati: II  TM Distance: >3 FB Neck ROM: full    Dental  (+) Poor Dentition, Chipped   Pulmonary sleep apnea , neg COPD,    Pulmonary exam normal breath sounds clear to auscultation       Cardiovascular hypertension, Pt. on medications (-) CAD and (-) Past MI negative cardio ROS Normal cardiovascular exam Rhythm:regular Rate:Normal     Neuro/Psych PSYCHIATRIC DISORDERS Depression Bipolar Disorder  Neuromuscular disease negative neurological ROS     GI/Hepatic negative GI ROS, Neg liver ROS, GERD  Medicated,  Endo/Other  diabetes, Type 2, Oral Hypoglycemic Agents  Renal/GU negative Renal ROS  negative genitourinary   Musculoskeletal   Abdominal (+) + obese,   Peds  Hematology negative hematology ROS (+)   Anesthesia Other Findings Past Medical History: No date: Depression No date: Diabetes mellitus without complication ([facility]) 03/07/15: Diabetic peripheral neuropathy ([facility]) 03/07/15: Diabetic peripheral neuropathy ([facility]) 03/07/15: Diabetic peripheral neuropathy ([facility]) No date: GERD (gastroesophageal reflux disease) 03/07/15: Hypercholesterolemia No date: Hypertension 03/07/15: Obesity 03/07/15: Personality disorder 03/07/15: Sinus tachycardia ([facility])     Comment: history of 03/07/15: Suicidal thoughts Past Surgical History: 03/07/15: electroconvulsion therapy BMI    Body Mass Index:  36.44 kg/m     Reproductive/Obstetrics                             Anesthesia Physical  Anesthesia Plan  ASA: III  Anesthesia Plan: General   Post-op Pain Management:    Induction: Intravenous  Airway Management  Planned: Mask  Additional Equipment:   Intra-op Plan:   Post-operative Plan:   Informed Consent: I have reviewed the patients History and Physical, chart, labs and discussed the procedure including the risks, benefits and alternatives for the proposed anesthesia with the patient or authorized representative who has indicated his/her understanding and acceptance.   Dental Advisory Given  Plan Discussed with: CRNA and Anesthesiologist  Anesthesia Plan Comments:         Anesthesia Quick Evaluation  

## 2016-11-29 NOTE — Anesthesia Postprocedure Evaluation (Signed)
Anesthesia Post Note  Patient: Melinda [name]  Procedure(s) Performed: * No procedures listed *  Patient location during evaluation: PACU Anesthesia Type: General Level of consciousness: awake and alert Pain management: pain level controlled Vital Signs Assessment: post-procedure vital signs reviewed and stable Respiratory status: spontaneous breathing, nonlabored ventilation, respiratory function stable and patient connected to nasal cannula oxygen Cardiovascular status: blood pressure returned to baseline and stable Postop Assessment: no signs of nausea or vomiting Anesthetic complications: no     Last Vitals:  Vitals:   11/29/16 1050 11/29/16 1100  BP: 139/80 (!) 144/74  Pulse: 87 83  Resp: 20 17  Temp:  37 C    Last Pain:  Vitals:   11/29/16 1040  TempSrc:   PainSc: 0-No pain                 Precious Haws Lucca Greggs

## 2016-11-29 NOTE — Anesthesia Procedure Notes (Signed)
Date/Time: 11/29/2016 10:29 AM Performed by: Dionne Bucy Pre-anesthesia Checklist: Patient identified, Emergency Drugs available, Suction available and Patient being monitored Patient Re-evaluated:Patient Re-evaluated prior to inductionOxygen Delivery Method: Circle system utilized Preoxygenation: Pre-oxygenation with 100% oxygen Intubation Type: IV induction Ventilation: Mask ventilation without difficulty and Mask ventilation throughout procedure Airway Equipment and Method: Bite block Placement Confirmation: positive ETCO2 Dental Injury: Teeth and Oropharynx as per pre-operative assessment

## 2016-11-29 NOTE — Discharge Instructions (Signed)
1)  The drugs that you have been given will stay in your system until tomorrow so for the       next 24 hours you should not:  A. Drive an automobile  B. Make any legal decisions  C. Drink any alcoholic beverages  2)  You may resume your regular meals upon return home.  3)  A responsible adult must take you home.  Someone should stay with you for a few          hours, then be available by phone for the remainder of the treatment day.  4)  You May experience any of the following symptoms:  Headache, Nausea and a dry mouth (due to the medications you were given),  temporary memory loss and some confusion, or sore muscles (a warm bath  should help this).  If you you experience any of these symptoms let us know on                your return visit.  5)  Report any of the following: any acute discomfort, severe headache, or temperature        greater than 100.5 F.   Also report any unusual redness, swelling, drainage, or pain         at your IV site.    You may report Symptoms to:  [place] at [facility]          Phone: [phone], ECT Department           or Dr. Prescott Gum office [phone]  6)  Your next ECT Treatment is Day Friday   Date December 07, 2015  At 830  We will call 2 days prior to your scheduled appoirntment for arrival times.  7)  Nothing to eat or drink after midnight the night before your procedure.  8)  Take .    With a sip of water the morning of your procedure.  9)  Other Instructions: Call [phone] to cancel the morning of your procedure due         to illness or emergency.  10) We will call within 72 hours to assess how you are feeling.

## 2016-12-04 ENCOUNTER — Telehealth: Payer: Self-pay

## 2016-12-05 ENCOUNTER — Other Ambulatory Visit: Payer: Self-pay | Admitting: Psychiatry

## 2016-12-06 ENCOUNTER — Encounter: Payer: Self-pay | Admitting: Anesthesiology

## 2016-12-06 ENCOUNTER — Encounter
Admission: RE | Admit: 2016-12-06 | Discharge: 2016-12-06 | Disposition: A | Discharge status: Home / Self Care | Payer: Medicare PPO | Source: Ambulatory Visit | Attending: Psychiatry | Admitting: Psychiatry

## 2016-12-06 DIAGNOSIS — E7800 Pure hypercholesterolemia, unspecified: Secondary | ICD-10-CM | POA: Insufficient documentation

## 2016-12-06 DIAGNOSIS — E1142 Type 2 diabetes mellitus with diabetic polyneuropathy: Secondary | ICD-10-CM | POA: Diagnosis not present

## 2016-12-06 DIAGNOSIS — Z833 Family history of diabetes mellitus: Secondary | ICD-10-CM | POA: Diagnosis not present

## 2016-12-06 DIAGNOSIS — F3189 Other bipolar disorder: Secondary | ICD-10-CM | POA: Diagnosis not present

## 2016-12-06 DIAGNOSIS — Z888 Allergy status to other drugs, medicaments and biological substances status: Secondary | ICD-10-CM | POA: Insufficient documentation

## 2016-12-06 DIAGNOSIS — F332 Major depressive disorder, recurrent severe without psychotic features: Secondary | ICD-10-CM | POA: Diagnosis not present

## 2016-12-06 DIAGNOSIS — E669 Obesity, unspecified: Secondary | ICD-10-CM | POA: Diagnosis not present

## 2016-12-06 DIAGNOSIS — Z8249 Family history of ischemic heart disease and other diseases of the circulatory system: Secondary | ICD-10-CM | POA: Diagnosis not present

## 2016-12-06 DIAGNOSIS — F338 Other recurrent depressive disorders: Secondary | ICD-10-CM | POA: Diagnosis not present

## 2016-12-06 DIAGNOSIS — Z6836 Body mass index (BMI) 36.0-36.9, adult: Secondary | ICD-10-CM | POA: Diagnosis not present

## 2016-12-06 DIAGNOSIS — E119 Type 2 diabetes mellitus without complications: Secondary | ICD-10-CM | POA: Diagnosis not present

## 2016-12-06 DIAGNOSIS — K219 Gastro-esophageal reflux disease without esophagitis: Secondary | ICD-10-CM | POA: Insufficient documentation

## 2016-12-06 DIAGNOSIS — I10 Essential (primary) hypertension: Secondary | ICD-10-CM | POA: Diagnosis not present

## 2016-12-06 LAB — POCT PREGNANCY, URINE: Preg Test, Ur: NEGATIVE

## 2016-12-06 LAB — GLUCOSE, CAPILLARY
Glucose-Capillary: 87 mg/dL (ref 65–99)
Glucose-Capillary: 110 mg/dL — ABNORMAL HIGH (ref 65–99)

## 2016-12-06 MED ORDER — METHOHEXITAL SODIUM 100 MG/10ML IV SOSY
PREFILLED_SYRINGE | INTRAVENOUS | Status: DC | PRN
  Administered 2016-12-06: 70 mg via INTRAVENOUS

## 2016-12-06 MED ORDER — ESMOLOL HCL 100 MG/10ML IV SOLN
INTRAVENOUS | Status: DC | PRN
  Administered 2016-12-06: 20 mg via INTRAVENOUS

## 2016-12-06 MED ORDER — GLYCOPYRROLATE 0.2 MG/ML IJ SOLN
0.4000 mg | Freq: Once | INTRAMUSCULAR | Status: AC
  Administered 2016-12-06: 0.4 mg via INTRAVENOUS

## 2016-12-06 MED ORDER — METHOHEXITAL SODIUM 0.5 G IJ SOLR
INTRAMUSCULAR | Status: AC
  Filled 2016-12-06: qty 500

## 2016-12-06 MED ORDER — KETOROLAC TROMETHAMINE 30 MG/ML IJ SOLN
30.0000 mg | Freq: Once | INTRAMUSCULAR | Status: AC
  Administered 2016-12-06: 30 mg via INTRAVENOUS

## 2016-12-06 MED ORDER — LABETALOL HCL 5 MG/ML IV SOLN
INTRAVENOUS | Status: DC | PRN
  Administered 2016-12-06: 20 mg via INTRAVENOUS

## 2016-12-06 MED ORDER — SUCCINYLCHOLINE CHLORIDE 20 MG/ML IJ SOLN
INTRAMUSCULAR | Status: DC | PRN
  Administered 2016-12-06: 100 mg via INTRAVENOUS

## 2016-12-06 MED ORDER — LABETALOL HCL 5 MG/ML IV SOLN
INTRAVENOUS | Status: AC
  Filled 2016-12-06: qty 4

## 2016-12-06 MED ORDER — KETOROLAC TROMETHAMINE 30 MG/ML IJ SOLN
INTRAMUSCULAR | Status: AC
  Administered 2016-12-06: 30 mg via INTRAVENOUS
  Filled 2016-12-06: qty 1

## 2016-12-06 MED ORDER — GLYCOPYRROLATE 0.2 MG/ML IJ SOLN
INTRAMUSCULAR | Status: AC
  Administered 2016-12-06: 0.4 mg via INTRAVENOUS
  Filled 2016-12-06: qty 2

## 2016-12-06 MED ORDER — SODIUM CHLORIDE 0.9 % IV SOLN
500.0000 mL | Freq: Once | INTRAVENOUS | Status: AC
  Administered 2016-12-06: 1000 mL via INTRAVENOUS

## 2016-12-06 MED ORDER — SODIUM CHLORIDE 0.9 % IV SOLN
INTRAVENOUS | Status: DC | PRN
  Administered 2016-12-06: 09:00:00 via INTRAVENOUS

## 2016-12-06 NOTE — Anesthesia Procedure Notes (Signed)
Date/Time: 12/06/2016 9:20 AM Performed by: Allean Found Pre-anesthesia Checklist: Patient identified, Emergency Drugs available, Suction available, Patient being monitored and Timeout performed Patient Re-evaluated:Patient Re-evaluated prior to inductionOxygen Delivery Method: Circle system utilized Preoxygenation: Pre-oxygenation with 100% oxygen Intubation Type: IV induction Ventilation: Mask ventilation without difficulty and Mask ventilation throughout procedure Airway Equipment and Method: Bite block Dental Injury: Teeth and Oropharynx as per pre-operative assessment

## 2016-12-06 NOTE — Anesthesia Post-op Follow-up Note (Cosign Needed)
Anesthesia QCDR form completed.        

## 2016-12-06 NOTE — H&P (Signed)
Melinda [name] is an 46 y.o. female.   Chief Complaint: Hasn't slept well the last couple days. Also has been having hot flashes. Mood a little bit more down. Chronic recurrent severe depression maintained with ECT regularly HPI: Chronic severe depression maintained with ECT regularly  Past Medical History:  Diagnosis Date  . Depression   . Diabetes mellitus without complication ([place])   . Diabetic peripheral neuropathy ([place]) 03/07/15  . Diabetic peripheral neuropathy ([place]) 03/07/15  . Diabetic peripheral neuropathy ([place]) 03/07/15  . GERD (gastroesophageal reflux disease)   . Hypercholesterolemia 03/07/15  . Hypertension   . Obesity 03/07/15  . Personality disorder 03/07/15  . Sinus tachycardia 03/07/15   history of  . Suicidal thoughts 03/07/15    Past Surgical History:  Procedure Laterality Date  . electroconvulsion therapy  03/07/15    Family History  Problem Relation Age of Onset  . Hypertension Father   . Diabetes Mother    Social History:  reports that she has never smoked. She has never used smokeless tobacco. She reports that she does not drink alcohol or use drugs.  Allergies:  Allergies  Allergen Reactions  . Prednisone     Increases blood sugar     (Not in a hospital admission)  Results for orders placed or performed during the hospital encounter of 12/06/16 (from the past 48 hour(s))  Pregnancy, urine POC     Status: None   Collection Time: 12/06/16  8:33 AM  Result Value Ref Range   Preg Test, Ur NEGATIVE NEGATIVE    Comment:        THE SENSITIVITY OF THIS METHODOLOGY IS >24 mIU/mL   Glucose, capillary     Status: None   Collection Time: 12/06/16  8:36 AM  Result Value Ref Range   Glucose-Capillary 87 65 - 99 mg/dL   No results found.  Review of Systems  Constitutional: Negative.   HENT: Negative.   Eyes: Negative.   Respiratory: Negative.   Cardiovascular: Negative.   Gastrointestinal: Negative.   Musculoskeletal: Negative.   Skin: Negative.    Neurological: Negative.   Psychiatric/Behavioral: Positive for depression. Negative for hallucinations, memory loss, substance abuse and suicidal ideas. The patient has insomnia. The patient is not nervous/anxious.     Blood pressure 135/88, pulse 94, temperature 97.9 F (36.6 C), temperature source Oral, resp. rate 18, height 5\' 4"  (1.626 m), weight 95.7 kg (211 lb), SpO2 100 %. Physical Exam  Nursing note and vitals reviewed. Constitutional: She appears well-developed and well-nourished.  HENT:  Head: Normocephalic and atraumatic.  Eyes: Conjunctivae are normal. Pupils are equal, round, and reactive to light.  Neck: Normal range of motion.  Cardiovascular: Normal rate, regular rhythm and normal heart sounds.   Respiratory: Effort normal and breath sounds normal. No respiratory distress.  GI: Soft.  Musculoskeletal: Normal range of motion.  Neurological: She is alert.  Skin: Skin is warm and dry.  Psychiatric: Judgment normal. Her speech is delayed. She is slowed and withdrawn. Thought content is not paranoid. Cognition and memory are normal. She exhibits a depressed mood. She expresses no homicidal and no suicidal ideation.     Assessment/Plan Follow-up one week. Treatment today usual protocol.  [name], MD 12/06/2016, 9:02 AM

## 2016-12-06 NOTE — Anesthesia Postprocedure Evaluation (Signed)
Anesthesia Post Note  Patient: Melinda [name]  Procedure(s) Performed: * No procedures listed *  Patient location during evaluation: PACU Anesthesia Type: General Level of consciousness: awake and alert Pain management: pain level controlled Vital Signs Assessment: post-procedure vital signs reviewed and stable Respiratory status: spontaneous breathing, nonlabored ventilation, respiratory function stable and patient connected to nasal cannula oxygen Cardiovascular status: blood pressure returned to baseline and stable Postop Assessment: no signs of nausea or vomiting Anesthetic complications: no     Last Vitals:  Vitals:   12/06/16 0957 12/06/16 0958  BP: 119/64 123/60  Pulse: 87 82  Resp: (!) 27 (!) 24  Temp: 36.6 C 36.8 C    Last Pain:  Vitals:   12/06/16 0958  TempSrc: Oral  PainSc:                  Martha Clan

## 2016-12-06 NOTE — Procedures (Signed)
ECT SERVICES Physician's Interval Evaluation & Treatment Note  Patient Identification: Melinda [name] MRN:  [id number] Date of Evaluation:  12/06/2016 TX #: [id number]  MADRS:   MMSE:   P.E. Findings:  No change to physical exam. Lungs clear. Heart normal.  Psychiatric Interval Note:  Mood a little bit more down and flat and withdrawn.  Subjective:  Patient is a 46 y.o. female seen for evaluation for Electroconvulsive Therapy. Has been having hot flashes and not sleeping the last couple days but mood is only a little bit worse. No sign of mania.  Treatment Summary:   []   Right Unilateral             [x]  Bilateral   % Energy : 1.0 ms, 35%   Impedance: 950 ohms  Seizure Energy Index: 2761 V squared  Postictal Suppression Index: Less than 10%  Seizure Concordance Index: 73%  Medications  Pre Shock: Robinul 0.4 mg, Toradol 30 mg, labetalol 20 mg, esmolol 10 mg, Brevital 70 mg, succinylcholine 100 mg  Post Shock:    Seizure Duration: 38 seconds by EMG, 51 seconds by EEG   Comments: Follow-up one week   Lungs:  [x]   Clear to auscultation               []  Other:   Heart:    [x]   Regular rhythm             []  irregular rhythm    [x]   Previous H&P reviewed, patient examined and there are NO CHANGES                 []   Previous H&P reviewed, patient examined and there are changes noted.   [name], MD 1/26/20189:04 AM

## 2016-12-06 NOTE — Anesthesia Preprocedure Evaluation (Signed)
Anesthesia Evaluation  Patient identified by MRN, date of birth, ID band Patient awake    Reviewed: Allergy & Precautions, H&P , NPO status , Patient's Chart, lab work & pertinent test results  History of Anesthesia Complications Negative for: history of anesthetic complications  Airway Mallampati: II  TM Distance: >3 FB Neck ROM: full    Dental  (+) Poor Dentition, Chipped   Pulmonary sleep apnea , neg COPD,    Pulmonary exam normal breath sounds clear to auscultation       Cardiovascular hypertension, Pt. on medications (-) CAD and (-) Past MI negative cardio ROS Normal cardiovascular exam Rhythm:regular Rate:Normal     Neuro/Psych PSYCHIATRIC DISORDERS Depression Bipolar Disorder  Neuromuscular disease negative neurological ROS     GI/Hepatic negative GI ROS, Neg liver ROS, GERD  Medicated,  Endo/Other  diabetes, Type 2, Oral Hypoglycemic Agents  Renal/GU negative Renal ROS  negative genitourinary   Musculoskeletal   Abdominal (+) + obese,   Peds  Hematology negative hematology ROS (+)   Anesthesia Other Findings Past Medical History: No date: Depression No date: Diabetes mellitus without complication ([facility]) 03/07/15: Diabetic peripheral neuropathy ([facility]) 03/07/15: Diabetic peripheral neuropathy ([facility]) 03/07/15: Diabetic peripheral neuropathy ([facility]) No date: GERD (gastroesophageal reflux disease) 03/07/15: Hypercholesterolemia No date: Hypertension 03/07/15: Obesity 03/07/15: Personality disorder 03/07/15: Sinus tachycardia ([facility])     Comment: history of 03/07/15: Suicidal thoughts Past Surgical History: 03/07/15: electroconvulsion therapy BMI    Body Mass Index:  36.44 kg/m     Reproductive/Obstetrics                             Anesthesia Physical  Anesthesia Plan  ASA: III  Anesthesia Plan: General   Post-op Pain Management:    Induction: Intravenous  Airway Management  Planned: Mask  Additional Equipment:   Intra-op Plan:   Post-operative Plan:   Informed Consent: I have reviewed the patients History and Physical, chart, labs and discussed the procedure including the risks, benefits and alternatives for the proposed anesthesia with the patient or authorized representative who has indicated his/her understanding and acceptance.   Dental Advisory Given  Plan Discussed with: CRNA and Anesthesiologist  Anesthesia Plan Comments:         Anesthesia Quick Evaluation  

## 2016-12-06 NOTE — Discharge Instructions (Signed)
1)  The drugs that you have been given will stay in your system until tomorrow so for the       next 24 hours you should not:  A. Drive an automobile  B. Make any legal decisions  C. Drink any alcoholic beverages  2)  You may resume your regular meals upon return home.  3)  A responsible adult must take you home.  Someone should stay with you for a few          hours, then be available by phone for the remainder of the treatment day.  4)  You May experience any of the following symptoms:  Headache, Nausea and a dry mouth (due to the medications you were given),  temporary memory loss and some confusion, or sore muscles (a warm bath  should help this).  If you you experience any of these symptoms let us know on                your return visit.  5)  Report any of the following: any acute discomfort, severe headache, or temperature        greater than 100.5 F.   Also report any unusual redness, swelling, drainage, or pain         at your IV site.    You may report Symptoms to:  [place] at [facility]          Phone: [phone], ECT Department           or Dr. Prescott Gum office [phone]  6)  Your next ECT Treatment is Day Friday  Date Dec 14, 2011  We will call 2 days prior to your scheduled appointment for arrival times.  7)  Nothing to eat or drink after midnight the night before your procedure.  8)  Take ...   With a sip of water the morning of your procedure.  9)  Other Instructions: Call [phone] to cancel the morning of your procedure due         to illness or emergency.  10) We will call within 72 hours to assess how you are feeling.

## 2016-12-06 NOTE — Anesthesia Procedure Notes (Signed)
Date/Time: 12/06/2016 9:15 AM Performed by: Allean Found Pre-anesthesia Checklist: Patient identified, Emergency Drugs available, Suction available, Patient being monitored and Timeout performed Patient Re-evaluated:Patient Re-evaluated prior to inductionOxygen Delivery Method: Circle system utilized and Simple face mask Preoxygenation: Pre-oxygenation with 100% oxygen Intubation Type: IV induction Ventilation: Mask ventilation without difficulty Placement Confirmation: positive ETCO2

## 2016-12-06 NOTE — Transfer of Care (Signed)
Immediate Anesthesia Transfer of Care Note  Patient: Melinda [name]  Procedure(s) Performed: * No procedures listed *  Patient Location: PACU  Anesthesia Type:General  Level of Consciousness: sedated  Airway & Oxygen Therapy: Patient Spontanous Breathing and Patient connected to face mask oxygen  Post-op Assessment: Report given to RN and Post -op Vital signs reviewed and stable  Post vital signs: Reviewed and unstable  Last Vitals:  Vitals:   12/06/16 0824  BP: 135/88  Pulse: 94  Resp: 18  Temp: 36.6 C    Last Pain:  Vitals:   12/06/16 0824  TempSrc: Oral  PainSc:          Complications: No apparent anesthesia complications

## 2016-12-13 ENCOUNTER — Encounter: Payer: Self-pay | Admitting: Anesthesiology

## 2016-12-13 ENCOUNTER — Encounter
Admission: RE | Admit: 2016-12-13 | Discharge: 2016-12-13 | Disposition: A | Discharge status: Home / Self Care | Payer: Medicare PPO | Source: Ambulatory Visit | Attending: Psychiatry | Admitting: Psychiatry

## 2016-12-13 ENCOUNTER — Other Ambulatory Visit: Payer: Self-pay | Admitting: Psychiatry

## 2016-12-13 DIAGNOSIS — F332 Major depressive disorder, recurrent severe without psychotic features: Secondary | ICD-10-CM

## 2016-12-13 DIAGNOSIS — E119 Type 2 diabetes mellitus without complications: Secondary | ICD-10-CM | POA: Diagnosis not present

## 2016-12-13 DIAGNOSIS — Z6837 Body mass index (BMI) 37.0-37.9, adult: Secondary | ICD-10-CM | POA: Diagnosis not present

## 2016-12-13 DIAGNOSIS — K219 Gastro-esophageal reflux disease without esophagitis: Secondary | ICD-10-CM | POA: Insufficient documentation

## 2016-12-13 DIAGNOSIS — F329 Major depressive disorder, single episode, unspecified: Secondary | ICD-10-CM | POA: Diagnosis not present

## 2016-12-13 DIAGNOSIS — I10 Essential (primary) hypertension: Secondary | ICD-10-CM | POA: Diagnosis not present

## 2016-12-13 DIAGNOSIS — F609 Personality disorder, unspecified: Secondary | ICD-10-CM | POA: Insufficient documentation

## 2016-12-13 DIAGNOSIS — E1142 Type 2 diabetes mellitus with diabetic polyneuropathy: Secondary | ICD-10-CM | POA: Insufficient documentation

## 2016-12-13 DIAGNOSIS — Z833 Family history of diabetes mellitus: Secondary | ICD-10-CM | POA: Diagnosis not present

## 2016-12-13 DIAGNOSIS — E7800 Pure hypercholesterolemia, unspecified: Secondary | ICD-10-CM | POA: Insufficient documentation

## 2016-12-13 DIAGNOSIS — Z8249 Family history of ischemic heart disease and other diseases of the circulatory system: Secondary | ICD-10-CM | POA: Insufficient documentation

## 2016-12-13 DIAGNOSIS — E669 Obesity, unspecified: Secondary | ICD-10-CM | POA: Diagnosis not present

## 2016-12-13 LAB — GLUCOSE, CAPILLARY: Glucose-Capillary: 96 mg/dL (ref 65–99)

## 2016-12-13 MED ORDER — GLYCOPYRROLATE 0.2 MG/ML IJ SOLN
0.4000 mg | Freq: Once | INTRAMUSCULAR | Status: AC
  Administered 2016-12-13: 0.4 mg via INTRAVENOUS

## 2016-12-13 MED ORDER — LABETALOL HCL 5 MG/ML IV SOLN
INTRAVENOUS | Status: DC | PRN
  Administered 2016-12-13: 20 mg via INTRAVENOUS

## 2016-12-13 MED ORDER — ESMOLOL HCL 100 MG/10ML IV SOLN
INTRAVENOUS | Status: AC
  Filled 2016-12-13: qty 10

## 2016-12-13 MED ORDER — LABETALOL HCL 5 MG/ML IV SOLN
INTRAVENOUS | Status: AC
  Filled 2016-12-13: qty 4

## 2016-12-13 MED ORDER — GLYCOPYRROLATE 0.2 MG/ML IJ SOLN
INTRAMUSCULAR | Status: AC
  Administered 2016-12-13: 0.4 mg via INTRAVENOUS
  Filled 2016-12-13: qty 2

## 2016-12-13 MED ORDER — KETOROLAC TROMETHAMINE 30 MG/ML IJ SOLN
INTRAMUSCULAR | Status: AC
  Filled 2016-12-13: qty 1

## 2016-12-13 MED ORDER — SODIUM CHLORIDE 0.9 % IV SOLN
500.0000 mL | Freq: Once | INTRAVENOUS | Status: AC
  Administered 2016-12-13: 09:00:00 via INTRAVENOUS

## 2016-12-13 MED ORDER — KETOROLAC TROMETHAMINE 30 MG/ML IJ SOLN: 30.0000 mg | Freq: Once | INTRAMUSCULAR | Status: DC

## 2016-12-13 MED ORDER — ESMOLOL HCL 100 MG/10ML IV SOLN
INTRAVENOUS | Status: DC | PRN
  Administered 2016-12-13: 20 mg via INTRAVENOUS

## 2016-12-13 MED ORDER — METHOHEXITAL SODIUM 0.5 G IJ SOLR
INTRAMUSCULAR | Status: AC
  Filled 2016-12-13: qty 500

## 2016-12-13 MED ORDER — SUCCINYLCHOLINE CHLORIDE 200 MG/10ML IV SOSY
PREFILLED_SYRINGE | INTRAVENOUS | Status: DC | PRN
  Administered 2016-12-13: 100 mg via INTRAVENOUS

## 2016-12-13 MED ORDER — SODIUM CHLORIDE 0.9 % IV SOLN
INTRAVENOUS | Status: DC | PRN
  Administered 2016-12-13 (×2): via INTRAVENOUS

## 2016-12-13 NOTE — Anesthesia Procedure Notes (Signed)
Date/Time: 12/13/2016 10:27 AM Performed by: Dionne Bucy Pre-anesthesia Checklist: Patient identified, Emergency Drugs available, Suction available and Patient being monitored Patient Re-evaluated:Patient Re-evaluated prior to inductionOxygen Delivery Method: Circle system utilized Preoxygenation: Pre-oxygenation with 100% oxygen Intubation Type: IV induction Ventilation: Mask ventilation without difficulty and Mask ventilation throughout procedure Airway Equipment and Method: Bite block Placement Confirmation: positive ETCO2 Dental Injury: Teeth and Oropharynx as per pre-operative assessment

## 2016-12-13 NOTE — Transfer of Care (Signed)
Immediate Anesthesia Transfer of Care Note  Patient: Melinda [name]  Procedure(s) Performed: ECT  Patient Location: PACU  Anesthesia Type:General  Level of Consciousness: sedated  Airway & Oxygen Therapy: Patient Spontanous Breathing and Patient connected to face mask oxygen  Post-op Assessment: Report given to RN and Post -op Vital signs reviewed and stable  Post vital signs: Reviewed and stable  Last Vitals:  Vitals:   12/13/16 0830 12/13/16 1037  BP: (!) 145/87   Pulse: 96 91  Resp: 20 (P) 20  Temp: 36.8 C (P) 37.1 C    Last Pain:  Vitals:   12/13/16 1037  TempSrc:   PainSc: (P) Asleep         Complications: No apparent anesthesia complications

## 2016-12-13 NOTE — H&P (Signed)
Melinda [name] is an 46 y.o. female.   Chief Complaint: No new complaint. Chronic depression and anxiety not worse than usual no physical complaints. HPI: Recurrent severe depression maintained with regular weekly ECT  Past Medical History:  Diagnosis Date  . Depression   . Diabetes mellitus without complication ([place])   . Diabetic peripheral neuropathy ([place]) 03/07/15  . Diabetic peripheral neuropathy ([place]) 03/07/15  . Diabetic peripheral neuropathy ([place]) 03/07/15  . GERD (gastroesophageal reflux disease)   . Hypercholesterolemia 03/07/15  . Hypertension   . Obesity 03/07/15  . Personality disorder 03/07/15  . Sinus tachycardia 03/07/15   history of  . Suicidal thoughts 03/07/15    Past Surgical History:  Procedure Laterality Date  . electroconvulsion therapy  03/07/15    Family History  Problem Relation Age of Onset  . Hypertension Father   . Diabetes Mother    Social History:  reports that she has never smoked. She has never used smokeless tobacco. She reports that she does not drink alcohol or use drugs.  Allergies:  Allergies  Allergen Reactions  . Prednisone     Increases blood sugar     (Not in a hospital admission)  Results for orders placed or performed during the hospital encounter of 12/13/16 (from the past 48 hour(s))  Glucose, capillary     Status: None   Collection Time: 12/13/16  8:31 AM  Result Value Ref Range   Glucose-Capillary 96 65 - 99 mg/dL   No results found.  Review of Systems  Constitutional: Negative.   HENT: Negative.   Eyes: Negative.   Respiratory: Negative.   Cardiovascular: Negative.   Gastrointestinal: Negative.   Musculoskeletal: Negative.   Skin: Negative.   Neurological: Negative.   Psychiatric/Behavioral: Negative.     Blood pressure (!) 145/87, pulse 96, temperature 98.2 F (36.8 C), temperature source Oral, resp. rate 20, height 5\' 4"  (1.626 m), weight 98.4 kg (217 lb), SpO2 100 %. Physical Exam  Nursing note and vitals  reviewed. Constitutional: She appears well-developed and well-nourished.  HENT:  Head: Normocephalic and atraumatic.  Eyes: Conjunctivae are normal. Pupils are equal, round, and reactive to light.  Neck: Normal range of motion.  Cardiovascular: Regular rhythm and normal heart sounds.   Respiratory: Effort normal. No respiratory distress.  GI: Soft.  Musculoskeletal: Normal range of motion.  Neurological: She is alert.  Skin: Skin is warm and dry.  Psychiatric: Judgment normal. Her mood appears anxious. Her speech is delayed. She is withdrawn. Thought content is not paranoid. Cognition and memory are normal. She exhibits a depressed mood. She expresses no homicidal and no suicidal ideation.     Assessment/Plan Follow-up next week no change to current treatment plan  [name], MD 12/13/2016, 10:17 AM

## 2016-12-13 NOTE — Anesthesia Post-op Follow-up Note (Cosign Needed)
Anesthesia QCDR form completed.        

## 2016-12-13 NOTE — Anesthesia Preprocedure Evaluation (Signed)
Anesthesia Evaluation  Patient identified by MRN, date of birth, ID band Patient awake    Reviewed: Allergy & Precautions, H&P , NPO status , Patient's Chart, lab work & pertinent test results  History of Anesthesia Complications Negative for: history of anesthetic complications  Airway Mallampati: II  TM Distance: >3 FB Neck ROM: full    Dental  (+) Poor Dentition, Chipped   Pulmonary sleep apnea , neg COPD,    Pulmonary exam normal breath sounds clear to auscultation       Cardiovascular hypertension, Pt. on medications (-) CAD and (-) Past MI negative cardio ROS Normal cardiovascular exam Rhythm:regular Rate:Normal     Neuro/Psych PSYCHIATRIC DISORDERS Depression Bipolar Disorder  Neuromuscular disease negative neurological ROS     GI/Hepatic negative GI ROS, Neg liver ROS, GERD  Medicated,  Endo/Other  diabetes, Type 2, Oral Hypoglycemic Agents  Renal/GU negative Renal ROS  negative genitourinary   Musculoskeletal   Abdominal (+) + obese,   Peds  Hematology negative hematology ROS (+)   Anesthesia Other Findings Past Medical History: No date: Depression No date: Diabetes mellitus without complication ([facility]) 03/07/15: Diabetic peripheral neuropathy ([facility]) 03/07/15: Diabetic peripheral neuropathy ([facility]) 03/07/15: Diabetic peripheral neuropathy ([facility]) No date: GERD (gastroesophageal reflux disease) 03/07/15: Hypercholesterolemia No date: Hypertension 03/07/15: Obesity 03/07/15: Personality disorder 03/07/15: Sinus tachycardia ([facility])     Comment: history of 03/07/15: Suicidal thoughts Past Surgical History: 03/07/15: electroconvulsion therapy BMI    Body Mass Index:  36.44 kg/m     Reproductive/Obstetrics                             Anesthesia Physical  Anesthesia Plan  ASA: III  Anesthesia Plan: General   Post-op Pain Management:    Induction: Intravenous  Airway Management  Planned: Mask  Additional Equipment:   Intra-op Plan:   Post-operative Plan:   Informed Consent: I have reviewed the patients History and Physical, chart, labs and discussed the procedure including the risks, benefits and alternatives for the proposed anesthesia with the patient or authorized representative who has indicated his/her understanding and acceptance.   Dental Advisory Given  Plan Discussed with: CRNA and Anesthesiologist  Anesthesia Plan Comments:         Anesthesia Quick Evaluation  

## 2016-12-13 NOTE — Anesthesia Postprocedure Evaluation (Signed)
Anesthesia Post Note  Patient: Melinda [name]  Procedure(s) Performed: * No procedures listed *  Patient location during evaluation: PACU Anesthesia Type: General Level of consciousness: awake and alert Pain management: pain level controlled Vital Signs Assessment: post-procedure vital signs reviewed and stable Respiratory status: spontaneous breathing, nonlabored ventilation, respiratory function stable and patient connected to nasal cannula oxygen Cardiovascular status: blood pressure returned to baseline and stable Postop Assessment: no signs of nausea or vomiting Anesthetic complications: no     Last Vitals:  Vitals:   12/13/16 1107 12/13/16 1119  BP: 119/75 (!) 139/95  Pulse: 86 84  Resp: 20 18  Temp: 36.6 C     Last Pain:  Vitals:   12/13/16 1107  TempSrc: Temporal  PainSc:                  [name]

## 2016-12-13 NOTE — Discharge Instructions (Signed)
1)  The drugs that you have been given will stay in your system until tomorrow so for the       next 24 hours you should not:  A. Drive an automobile  B. Make any legal decisions  C. Drink any alcoholic beverages  2)  You may resume your regular meals upon return home.  3)  A responsible adult must take you home.  Someone should stay with you for a few          hours, then be available by phone for the remainder of the treatment day.  4)  You May experience any of the following symptoms:  Headache, Nausea and a dry mouth (due to the medications you were given),  temporary memory loss and some confusion, or sore muscles (a warm bath  should help this).  If you you experience any of these symptoms let us know on                your return visit.  5)  Report any of the following: any acute discomfort, severe headache, or temperature        greater than 100.5 F.   Also report any unusual redness, swelling, drainage, or pain         at your IV site.    You may report Symptoms to:  [place] at [facility]          Phone: [phone], ECT Department           or Dr. Prescott Gum office [phone]  6)  Your next ECT Treatment is Day Friday  Date February 9th at 8 am  We will call 2 days prior to your scheduled appointment for arrival times.  7)  Nothing to eat or drink after midnight the night before your procedure.  8)  Take .   With a sip of water the morning of your procedure.  9)  Other Instructions: Call [phone] to cancel the morning of your procedure due         to illness or emergency.  10) We will call within 72 hours to assess how you are feeling.

## 2016-12-13 NOTE — Procedures (Signed)
ECT SERVICES Physician's Interval Evaluation & Treatment Note  Patient Identification: Melinda [name] MRN:  [id number] Date of Evaluation:  12/13/2016 TX #: [id number]  MADRS:   MMSE:   P.E. Findings:  No change to physical exam  Psychiatric Interval Note:  Stable mild depression  Subjective:  Patient is a 46 y.o. female seen for evaluation for Electroconvulsive Therapy. No specific complaint  Treatment Summary:   []   Right Unilateral             [x]  Bilateral   % Energy : 1.0 ms 35%   Impedance: 1470 ohms  Seizure Energy Index: No reading  Postictal Suppression Index: No reading  Seizure Concordance Index: No review  Medications  Pre Shock: Robinul 0.4 mg, labetalol 20 mg, esmolol 10 mg, Toradol 30 mg, Brevital 70 mg, succinylcholine 100 mg  Post Shock: None  Seizure Duration: 40 seconds by EMG 95 seconds by EEG   Comments: For reasons I do not understand the computer made no reading of the EEG seizure even though it was extremely clearcut and had good postictal suppression. Full 9 95 seconds seizure. Follow-up next week  Lungs:  [x]   Clear to auscultation               []  Other:   Heart:    [x]   Regular rhythm             []  irregular rhythm    [x]   Previous H&P reviewed, patient examined and there are NO CHANGES                 []   Previous H&P reviewed, patient examined and there are changes noted.   [name], MD 2/2/201810:19 AM

## 2016-12-16 ENCOUNTER — Telehealth: Payer: Self-pay

## 2016-12-20 ENCOUNTER — Ambulatory Visit
Admission: RE | Admit: 2016-12-20 | Discharge: 2016-12-20 | Disposition: A | Discharge status: Home / Self Care | Payer: Medicare PPO | Source: Ambulatory Visit | Attending: Psychiatry | Admitting: Psychiatry

## 2016-12-20 ENCOUNTER — Encounter: Payer: Self-pay | Admitting: Anesthesiology

## 2016-12-20 ENCOUNTER — Other Ambulatory Visit: Payer: Self-pay | Admitting: Psychiatry

## 2016-12-20 DIAGNOSIS — K219 Gastro-esophageal reflux disease without esophagitis: Secondary | ICD-10-CM | POA: Insufficient documentation

## 2016-12-20 DIAGNOSIS — E1142 Type 2 diabetes mellitus with diabetic polyneuropathy: Secondary | ICD-10-CM | POA: Diagnosis not present

## 2016-12-20 DIAGNOSIS — I10 Essential (primary) hypertension: Secondary | ICD-10-CM | POA: Diagnosis not present

## 2016-12-20 DIAGNOSIS — E669 Obesity, unspecified: Secondary | ICD-10-CM | POA: Diagnosis not present

## 2016-12-20 DIAGNOSIS — F329 Major depressive disorder, single episode, unspecified: Secondary | ICD-10-CM | POA: Diagnosis not present

## 2016-12-20 DIAGNOSIS — Z6838 Body mass index (BMI) 38.0-38.9, adult: Secondary | ICD-10-CM | POA: Insufficient documentation

## 2016-12-20 DIAGNOSIS — E119 Type 2 diabetes mellitus without complications: Secondary | ICD-10-CM | POA: Diagnosis not present

## 2016-12-20 DIAGNOSIS — F332 Major depressive disorder, recurrent severe without psychotic features: Secondary | ICD-10-CM | POA: Diagnosis not present

## 2016-12-20 DIAGNOSIS — E7800 Pure hypercholesterolemia, unspecified: Secondary | ICD-10-CM | POA: Insufficient documentation

## 2016-12-20 LAB — GLUCOSE, CAPILLARY: Glucose-Capillary: 94 mg/dL (ref 65–99)

## 2016-12-20 MED ORDER — GLYCOPYRROLATE 0.2 MG/ML IJ SOLN
0.4000 mg | Freq: Once | INTRAMUSCULAR | Status: AC
  Administered 2016-12-20: 0.4 mg via INTRAVENOUS

## 2016-12-20 MED ORDER — ESMOLOL HCL 100 MG/10ML IV SOLN
INTRAVENOUS | Status: AC
Start: 2016-12-20 — End: 2016-12-20
  Filled 2016-12-20: qty 10

## 2016-12-20 MED ORDER — KETOROLAC TROMETHAMINE 30 MG/ML IJ SOLN
30.0000 mg | Freq: Once | INTRAMUSCULAR | Status: AC
  Administered 2016-12-20: 30 mg via INTRAVENOUS

## 2016-12-20 MED ORDER — KETOROLAC TROMETHAMINE 30 MG/ML IJ SOLN
INTRAMUSCULAR | Status: AC
  Administered 2016-12-20: 30 mg via INTRAVENOUS
  Filled 2016-12-20: qty 1

## 2016-12-20 MED ORDER — SODIUM CHLORIDE 0.9 % IV SOLN
500.0000 mL | Freq: Once | INTRAVENOUS | Status: AC
  Administered 2016-12-20: 1000 mL via INTRAVENOUS

## 2016-12-20 MED ORDER — METHOHEXITAL SODIUM 0.5 G IJ SOLR
INTRAMUSCULAR | Status: AC
  Filled 2016-12-20: qty 1000

## 2016-12-20 MED ORDER — METHOHEXITAL SODIUM 100 MG/10ML IV SOSY
PREFILLED_SYRINGE | INTRAVENOUS | Status: DC | PRN
  Administered 2016-12-20: 70 mg via INTRAVENOUS

## 2016-12-20 MED ORDER — ESMOLOL HCL 100 MG/10ML IV SOLN
INTRAVENOUS | Status: DC | PRN
  Administered 2016-12-20: 20 mg via INTRAVENOUS

## 2016-12-20 MED ORDER — LABETALOL HCL 5 MG/ML IV SOLN
INTRAVENOUS | Status: DC | PRN
  Administered 2016-12-20: 20 mg via INTRAVENOUS

## 2016-12-20 MED ORDER — GLYCOPYRROLATE 0.2 MG/ML IJ SOLN
INTRAMUSCULAR | Status: AC
  Administered 2016-12-20: 0.4 mg via INTRAVENOUS
  Filled 2016-12-20: qty 2

## 2016-12-20 MED ORDER — SUCCINYLCHOLINE CHLORIDE 200 MG/10ML IV SOSY
PREFILLED_SYRINGE | INTRAVENOUS | Status: DC | PRN
  Administered 2016-12-20: 100 mg via INTRAVENOUS

## 2016-12-20 NOTE — Anesthesia Postprocedure Evaluation (Signed)
Anesthesia Post Note  Patient: Melinda [name]  Procedure(s) Performed: * No procedures listed *  Patient location during evaluation: PACU Anesthesia Type: General Level of consciousness: awake and alert Pain management: pain level controlled Vital Signs Assessment: post-procedure vital signs reviewed and stable Respiratory status: spontaneous breathing, nonlabored ventilation, respiratory function stable and patient connected to nasal cannula oxygen Cardiovascular status: blood pressure returned to baseline and stable Postop Assessment: no signs of nausea or vomiting Anesthetic complications: no     Last Vitals:  Vitals:   12/20/16 1056 12/20/16 1113  BP: 135/75   Pulse: 84   Resp: 19   Temp: 36.8 C 36.8 C    Last Pain:  Vitals:   12/20/16 1031  TempSrc:   PainSc: Asleep                 Precious Haws Shadee Montoya

## 2016-12-20 NOTE — Anesthesia Preprocedure Evaluation (Signed)
Anesthesia Evaluation  Patient identified by MRN, date of birth, ID band Patient awake    Reviewed: Allergy & Precautions, H&P , NPO status , Patient's Chart, lab work & pertinent test results  History of Anesthesia Complications Negative for: history of anesthetic complications  Airway Mallampati: II  TM Distance: >3 FB Neck ROM: full    Dental  (+) Poor Dentition, Chipped   Pulmonary sleep apnea , neg COPD,    Pulmonary exam normal breath sounds clear to auscultation       Cardiovascular hypertension, Pt. on medications (-) CAD and (-) Past MI negative cardio ROS Normal cardiovascular exam Rhythm:regular Rate:Normal     Neuro/Psych PSYCHIATRIC DISORDERS Depression Bipolar Disorder  Neuromuscular disease negative neurological ROS     GI/Hepatic negative GI ROS, Neg liver ROS, GERD  Medicated,  Endo/Other  diabetes, Type 2, Oral Hypoglycemic Agents  Renal/GU negative Renal ROS  negative genitourinary   Musculoskeletal   Abdominal (+) + obese,   Peds  Hematology negative hematology ROS (+)   Anesthesia Other Findings Past Medical History: No date: Depression No date: Diabetes mellitus without complication ([facility]) 03/07/15: Diabetic peripheral neuropathy ([facility]) 03/07/15: Diabetic peripheral neuropathy ([facility]) 03/07/15: Diabetic peripheral neuropathy ([facility]) No date: GERD (gastroesophageal reflux disease) 03/07/15: Hypercholesterolemia No date: Hypertension 03/07/15: Obesity 03/07/15: Personality disorder 03/07/15: Sinus tachycardia ([facility])     Comment: history of 03/07/15: Suicidal thoughts Past Surgical History: 03/07/15: electroconvulsion therapy BMI    Body Mass Index:  36.44 kg/m     Reproductive/Obstetrics                             Anesthesia Physical  Anesthesia Plan  ASA: III  Anesthesia Plan: General   Post-op Pain Management:    Induction: Intravenous  Airway Management  Planned: Mask  Additional Equipment:   Intra-op Plan:   Post-operative Plan:   Informed Consent: I have reviewed the patients History and Physical, chart, labs and discussed the procedure including the risks, benefits and alternatives for the proposed anesthesia with the patient or authorized representative who has indicated his/her understanding and acceptance.   Dental Advisory Given  Plan Discussed with: CRNA and Anesthesiologist  Anesthesia Plan Comments:         Anesthesia Quick Evaluation  

## 2016-12-20 NOTE — Discharge Instructions (Signed)
1)  The drugs that you have been given will stay in your system until tomorrow so for the       next 24 hours you should not:  A. Drive an automobile  B. Make any legal decisions  C. Drink any alcoholic beverages  2)  You may resume your regular meals upon return home.  3)  A responsible adult must take you home.  Someone should stay with you for a few          hours, then be available by phone for the remainder of the treatment day.  4)  You May experience any of the following symptoms:  Headache, Nausea and a dry mouth (due to the medications you were given),  temporary memory loss and some confusion, or sore muscles (a warm bath  should help this).  If you you experience any of these symptoms let us know on                your return visit.  5)  Report any of the following: any acute discomfort, severe headache, or temperature        greater than 100.5 F.   Also report any unusual redness, swelling, drainage, or pain         at your IV site.    You may report Symptoms to:  [place] at [facility]          Phone: [phone], ECT Department           or Dr. Prescott Gum office [phone]  6)  Your next ECT Treatment is Day Friday  Date December 27, 2016  We will call 2 days prior to your scheduled appointment for arrival times.  7)  Nothing to eat or drink after midnight the night before your procedure.  8)  Take .    With a sip of water the morning of your procedure.  9)  Other Instructions: Call [phone] to cancel the morning of your procedure due         to illness or emergency.  10) We will call within 72 hours to assess how you are feeling.

## 2016-12-20 NOTE — Anesthesia Post-op Follow-up Note (Cosign Needed)
Anesthesia QCDR form completed.        

## 2016-12-20 NOTE — Anesthesia Procedure Notes (Signed)
Date/Time: 12/20/2016 10:21 AM Performed by: Dionne Bucy Pre-anesthesia Checklist: Patient identified, Emergency Drugs available, Suction available and Patient being monitored Patient Re-evaluated:Patient Re-evaluated prior to inductionOxygen Delivery Method: Circle system utilized Preoxygenation: Pre-oxygenation with 100% oxygen Intubation Type: IV induction Ventilation: Mask ventilation without difficulty and Mask ventilation throughout procedure Airway Equipment and Method: Bite block Placement Confirmation: positive ETCO2 Dental Injury: Teeth and Oropharynx as per pre-operative assessment

## 2016-12-20 NOTE — Transfer of Care (Signed)
Immediate Anesthesia Transfer of Care Note  Patient: Melinda [name]  Procedure(s) Performed: ECT  Patient Location: PACU  Anesthesia Type:General  Level of Consciousness: sedated  Airway & Oxygen Therapy: Patient Spontanous Breathing and Patient connected to face mask oxygen  Post-op Assessment: Report given to RN and Post -op Vital signs reviewed and stable  Post vital signs: Reviewed and stable  Last Vitals:  Vitals:   12/20/16 0820  BP: (!) 146/85  Pulse: 86  Resp: 18  Temp: 36.8 C    Last Pain:  Vitals:   12/20/16 0820  TempSrc: Oral  PainSc: 0-No pain         Complications: No apparent anesthesia complications

## 2016-12-20 NOTE — H&P (Signed)
Melinda [name] is an 46 y.o. female.   Chief Complaint: No specific new complaint HPI: Chronic depression and maintenance ECT  Past Medical History:  Diagnosis Date  . Depression   . Diabetes mellitus without complication ([place])   . Diabetic peripheral neuropathy ([place]) 03/07/15  . Diabetic peripheral neuropathy ([place]) 03/07/15  . Diabetic peripheral neuropathy ([place]) 03/07/15  . GERD (gastroesophageal reflux disease)   . Hypercholesterolemia 03/07/15  . Hypertension   . Obesity 03/07/15  . Personality disorder 03/07/15  . Sinus tachycardia 03/07/15   history of  . Suicidal thoughts 03/07/15    Past Surgical History:  Procedure Laterality Date  . electroconvulsion therapy  03/07/15    Family History  Problem Relation Age of Onset  . Hypertension Father   . Diabetes Mother    Social History:  reports that she has never smoked. She has never used smokeless tobacco. She reports that she does not drink alcohol or use drugs.  Allergies:  Allergies  Allergen Reactions  . Prednisone     Increases blood sugar     (Not in a hospital admission)  Results for orders placed or performed during the hospital encounter of 12/20/16 (from the past 48 hour(s))  Glucose, capillary     Status: None   Collection Time: 12/20/16  8:27 AM  Result Value Ref Range   Glucose-Capillary 94 65 - 99 mg/dL   No results found.  Review of Systems  Constitutional: Negative.   HENT: Negative.   Eyes: Negative.   Respiratory: Negative.   Cardiovascular: Negative.   Gastrointestinal: Negative.   Musculoskeletal: Negative.   Skin: Negative.   Neurological: Negative.   Psychiatric/Behavioral: Negative.     Blood pressure (!) 146/85, pulse 86, temperature 98.2 F (36.8 C), temperature source Oral, resp. rate 18, height 5\' 4"  (1.626 m), weight 101.2 kg (223 lb), SpO2 100 %. Physical Exam  Nursing note and vitals reviewed. Constitutional: She appears well-developed and well-nourished.  HENT:  Head:  Normocephalic and atraumatic.  Eyes: Conjunctivae are normal. Pupils are equal, round, and reactive to light.  Neck: Normal range of motion.  Cardiovascular: Regular rhythm and normal heart sounds.   Respiratory: Effort normal.  GI: Soft.  Musculoskeletal: Normal range of motion.  Neurological: She is alert.  Skin: Skin is warm and dry.  Psychiatric: She has a normal mood and affect. Her behavior is normal. Judgment and thought content normal.     Assessment/Plan Treatment today follow-up next Friday  [name], MD 12/20/2016, 10:09 AM

## 2016-12-20 NOTE — Procedures (Signed)
ECT SERVICES Physician's Interval Evaluation & Treatment Note  Patient Identification: Melinda [name] MRN:  [id number] Date of Evaluation:  12/20/2016 TX #: [id number]  MADRS: 17 MMSE: 28 P.E. Findings:  No change to physical exam vital stable  Psychiatric Interval Note:  Mood stable no return of severe depression  Subjective:  Patient is a 46 y.o. female seen for evaluation for Electroconvulsive Therapy. No new complaint  Treatment Summary:   []   Right Unilateral             [x]  Bilateral   % Energy : 1.0 ms 35%   Impedance: 1450 ohms  Seizure Energy Index: 3038 V squared  Postictal Suppression Index: 80%  Seizure Concordance Index: 86%  Medications  Pre Shock: Robinul 0.4 mg, Toradol 30 mg, labetalol 20 mg, esmolol 10 mg, Brevital 70 mg, succinylcholine 100 mg  Post Shock: None  Seizure Duration: 36 seconds by EMG, 84 seconds by EEG   Comments: Follow-up one week   Lungs:  [x]   Clear to auscultation               []  Other:   Heart:    [x]   Regular rhythm             []  irregular rhythm    [x]   Previous H&P reviewed, patient examined and there are NO CHANGES                 []   Previous H&P reviewed, patient examined and there are changes noted.   [name], MD 2/9/201810:10 AM

## 2016-12-27 ENCOUNTER — Other Ambulatory Visit: Payer: Self-pay | Admitting: Psychiatry

## 2016-12-27 ENCOUNTER — Encounter: Payer: Self-pay | Admitting: Anesthesiology

## 2016-12-27 ENCOUNTER — Encounter (HOSPITAL_BASED_OUTPATIENT_CLINIC_OR_DEPARTMENT_OTHER)
Admission: RE | Admit: 2016-12-27 | Discharge: 2016-12-27 | Disposition: A | Discharge status: Home / Self Care | Payer: Medicare PPO | Source: Ambulatory Visit | Attending: Psychiatry | Admitting: Psychiatry

## 2016-12-27 DIAGNOSIS — E7800 Pure hypercholesterolemia, unspecified: Secondary | ICD-10-CM | POA: Diagnosis not present

## 2016-12-27 DIAGNOSIS — F609 Personality disorder, unspecified: Secondary | ICD-10-CM | POA: Diagnosis not present

## 2016-12-27 DIAGNOSIS — F338 Other recurrent depressive disorders: Secondary | ICD-10-CM | POA: Diagnosis not present

## 2016-12-27 DIAGNOSIS — F332 Major depressive disorder, recurrent severe without psychotic features: Secondary | ICD-10-CM

## 2016-12-27 DIAGNOSIS — K219 Gastro-esophageal reflux disease without esophagitis: Secondary | ICD-10-CM | POA: Diagnosis not present

## 2016-12-27 DIAGNOSIS — E669 Obesity, unspecified: Secondary | ICD-10-CM | POA: Diagnosis not present

## 2016-12-27 DIAGNOSIS — I10 Essential (primary) hypertension: Secondary | ICD-10-CM | POA: Diagnosis not present

## 2016-12-27 DIAGNOSIS — Z8249 Family history of ischemic heart disease and other diseases of the circulatory system: Secondary | ICD-10-CM | POA: Diagnosis not present

## 2016-12-27 DIAGNOSIS — E1142 Type 2 diabetes mellitus with diabetic polyneuropathy: Secondary | ICD-10-CM | POA: Diagnosis not present

## 2016-12-27 DIAGNOSIS — Z6837 Body mass index (BMI) 37.0-37.9, adult: Secondary | ICD-10-CM | POA: Diagnosis not present

## 2016-12-27 DIAGNOSIS — E119 Type 2 diabetes mellitus without complications: Secondary | ICD-10-CM | POA: Diagnosis not present

## 2016-12-27 LAB — GLUCOSE, CAPILLARY: Glucose-Capillary: 95 mg/dL (ref 65–99)

## 2016-12-27 MED ORDER — FENTANYL CITRATE (PF) 100 MCG/2ML IJ SOLN: 25.0000 ug | INTRAMUSCULAR | Status: DC | PRN

## 2016-12-27 MED ORDER — LABETALOL HCL 5 MG/ML IV SOLN
INTRAVENOUS | Status: DC | PRN
  Administered 2016-12-27: 20 mg via INTRAVENOUS

## 2016-12-27 MED ORDER — GLYCOPYRROLATE 0.2 MG/ML IJ SOLN
0.4000 mg | Freq: Once | INTRAMUSCULAR | Status: AC
  Administered 2016-12-27: 0.4 mg via INTRAVENOUS

## 2016-12-27 MED ORDER — SUCCINYLCHOLINE CHLORIDE 200 MG/10ML IV SOSY
PREFILLED_SYRINGE | INTRAVENOUS | Status: DC | PRN
  Administered 2016-12-27: 100 mg via INTRAVENOUS

## 2016-12-27 MED ORDER — LABETALOL HCL 5 MG/ML IV SOLN
INTRAVENOUS | Status: AC
  Filled 2016-12-27: qty 4

## 2016-12-27 MED ORDER — GLYCOPYRROLATE 0.2 MG/ML IJ SOLN
INTRAMUSCULAR | Status: AC
  Administered 2016-12-27: 0.4 mg via INTRAVENOUS
  Filled 2016-12-27: qty 2

## 2016-12-27 MED ORDER — SODIUM CHLORIDE 0.9 % IV SOLN
500.0000 mL | Freq: Once | INTRAVENOUS | Status: AC
  Administered 2016-12-27: 09:00:00 via INTRAVENOUS

## 2016-12-27 MED ORDER — ESMOLOL HCL 100 MG/10ML IV SOLN
INTRAVENOUS | Status: DC | PRN
  Administered 2016-12-27: 20 mg via INTRAVENOUS

## 2016-12-27 MED ORDER — SODIUM CHLORIDE 0.9 % IV SOLN
INTRAVENOUS | Status: DC | PRN
  Administered 2016-12-27 (×2): via INTRAVENOUS

## 2016-12-27 MED ORDER — METHOHEXITAL SODIUM 0.5 G IJ SOLR
INTRAMUSCULAR | Status: AC
  Filled 2016-12-27: qty 500

## 2016-12-27 MED ORDER — KETOROLAC TROMETHAMINE 30 MG/ML IJ SOLN
30.0000 mg | Freq: Once | INTRAMUSCULAR | Status: AC
  Administered 2016-12-27: 30 mg via INTRAVENOUS

## 2016-12-27 MED ORDER — KETOROLAC TROMETHAMINE 30 MG/ML IJ SOLN
INTRAMUSCULAR | Status: AC
  Administered 2016-12-27: 30 mg via INTRAVENOUS
  Filled 2016-12-27: qty 1

## 2016-12-27 MED ORDER — ONDANSETRON HCL 4 MG/2ML IJ SOLN: 4.0000 mg | Freq: Once | INTRAMUSCULAR | Status: DC | PRN

## 2016-12-27 MED ORDER — SUCCINYLCHOLINE CHLORIDE 200 MG/10ML IV SOSY
PREFILLED_SYRINGE | INTRAVENOUS | Status: AC
  Filled 2016-12-27: qty 10

## 2016-12-27 MED ORDER — ESMOLOL HCL 100 MG/10ML IV SOLN
INTRAVENOUS | Status: AC
Start: 2016-12-27 — End: 2016-12-27
  Filled 2016-12-27: qty 10

## 2016-12-27 NOTE — Procedures (Signed)
ECT SERVICES Physician's Interval Evaluation & Treatment Note  Patient Identification: Melinda [name] MRN:  [id number] Date of Evaluation:  12/27/2016 TX #: [id number]  MADRS:   MMSE:   P.E. Findings:  No change to physical exam. Vitals unremarkable heart and lungs normal  Psychiatric Interval Note:  Mood is stable no complaints  Subjective:  Patient is a 46 y.o. female seen for evaluation for Electroconvulsive Therapy. Feeling good  Treatment Summary:   []   Right Unilateral             [x]  Bilateral   % Energy : 1.0 ms 35%   Impedance: 1100 ohms  Seizure Energy Index: 6355 V squared  Postictal Suppression Index: 89%  Seizure Concordance Index: 96%  Medications  Pre Shock: Robinul 0.4 mg Toradol 30 mg labetalol 20 mg esmolol 10 mg Brevital 70 mg succinylcholine 100 mg  Post Shock:    Seizure Duration: 44 seconds by EMG 93 seconds by EEG   Comments: Follow-up one week   Lungs:  [x]   Clear to auscultation               []  Other:   Heart:    [x]   Regular rhythm             []  irregular rhythm    [x]   Previous H&P reviewed, patient examined and there are NO CHANGES                 []   Previous H&P reviewed, patient examined and there are changes noted.   [name], MD 2/16/201810:14 AM

## 2016-12-27 NOTE — Anesthesia Post-op Follow-up Note (Cosign Needed)
Anesthesia QCDR form completed.        

## 2016-12-27 NOTE — Transfer of Care (Signed)
Immediate Anesthesia Transfer of Care Note  Patient: Melinda [name]  Procedure(s) Performed: ECT  Patient Location: PACU  Anesthesia Type:General  Level of Consciousness: sedated  Airway & Oxygen Therapy: Patient Spontanous Breathing and Patient connected to face mask oxygen  Post-op Assessment: Report given to RN and Post -op Vital signs reviewed and stable  Post vital signs: Reviewed and stable  Last Vitals:  Vitals:   12/27/16 0846  BP: (!) 147/78  Pulse: 98  Resp: 16  Temp: 36.4 C    Last Pain:  Vitals:   12/27/16 0846  TempSrc: Oral         Complications: No apparent anesthesia complications

## 2016-12-27 NOTE — Anesthesia Preprocedure Evaluation (Signed)
Anesthesia Evaluation  Patient identified by MRN, date of birth, ID band Patient awake    Reviewed: Allergy & Precautions, H&P , NPO status , Patient's Chart, lab work & pertinent test results  History of Anesthesia Complications Negative for: history of anesthetic complications  Airway Mallampati: II  TM Distance: >3 FB Neck ROM: full    Dental  (+) Poor Dentition, Chipped   Pulmonary sleep apnea , neg COPD,    Pulmonary exam normal breath sounds clear to auscultation       Cardiovascular hypertension, Pt. on medications (-) CAD and (-) Past MI negative cardio ROS Normal cardiovascular exam Rhythm:regular Rate:Normal     Neuro/Psych PSYCHIATRIC DISORDERS Depression Bipolar Disorder  Neuromuscular disease negative neurological ROS     GI/Hepatic negative GI ROS, Neg liver ROS, GERD  Medicated,  Endo/Other  diabetes, Type 2, Oral Hypoglycemic Agents  Renal/GU negative Renal ROS  negative genitourinary   Musculoskeletal   Abdominal (+) + obese,   Peds  Hematology negative hematology ROS (+)   Anesthesia Other Findings Past Medical History: No date: Depression No date: Diabetes mellitus without complication ([facility]) 03/07/15: Diabetic peripheral neuropathy ([facility]) 03/07/15: Diabetic peripheral neuropathy ([facility]) 03/07/15: Diabetic peripheral neuropathy ([facility]) No date: GERD (gastroesophageal reflux disease) 03/07/15: Hypercholesterolemia No date: Hypertension 03/07/15: Obesity 03/07/15: Personality disorder 03/07/15: Sinus tachycardia ([facility])     Comment: history of 03/07/15: Suicidal thoughts Past Surgical History: 03/07/15: electroconvulsion therapy BMI    Body Mass Index:  36.44 kg/m     Reproductive/Obstetrics                             Anesthesia Physical  Anesthesia Plan  ASA: III  Anesthesia Plan: General   Post-op Pain Management:    Induction: Intravenous  Airway Management  Planned: Mask  Additional Equipment:   Intra-op Plan:   Post-operative Plan:   Informed Consent: I have reviewed the patients History and Physical, chart, labs and discussed the procedure including the risks, benefits and alternatives for the proposed anesthesia with the patient or authorized representative who has indicated his/her understanding and acceptance.   Dental Advisory Given  Plan Discussed with: CRNA and Anesthesiologist  Anesthesia Plan Comments:         Anesthesia Quick Evaluation  

## 2016-12-27 NOTE — Anesthesia Postprocedure Evaluation (Signed)
Anesthesia Post Note  Patient: Melinda [name]  Procedure(s) Performed: * No procedures listed *  Patient location during evaluation: PACU Anesthesia Type: General Level of consciousness: awake and alert Pain management: pain level controlled Vital Signs Assessment: post-procedure vital signs reviewed and stable Respiratory status: spontaneous breathing, nonlabored ventilation, respiratory function stable and patient connected to nasal cannula oxygen Cardiovascular status: blood pressure returned to baseline and stable Postop Assessment: no signs of nausea or vomiting Anesthetic complications: no     Last Vitals:  Vitals:   12/27/16 1105 12/27/16 1119  BP: 126/80 (!) 144/62  Pulse: 81 76  Resp: (!) 28 18  Temp: 37 C     Last Pain:  Vitals:   12/27/16 1119  TempSrc: Oral  PainSc: 0-No pain                 Anothy Bufano S

## 2016-12-27 NOTE — Discharge Instructions (Signed)
1)  The drugs that you have been given will stay in your system until tomorrow so for the       next 24 hours you should not:  A. Drive an automobile  B. Make any legal decisions  C. Drink any alcoholic beverages  2)  You may resume your regular meals upon return home.  3)  A responsible adult must take you home.  Someone should stay with you for a few          hours, then be available by phone for the remainder of the treatment day.  4)  You May experience any of the following symptoms:  Headache, Nausea and a dry mouth (due to the medications you were given),  temporary memory loss and some confusion, or sore muscles (a warm bath  should help this).  If you you experience any of these symptoms let us know on                your return visit.  5)  Report any of the following: any acute discomfort, severe headache, or temperature        greater than 100.5 F.   Also report any unusual redness, swelling, drainage, or pain         at your IV site.    You may report Symptoms to:  [place] [facility]          Phone: [phone], ECT Department           or Dr. Prescott Gum office [phone]  6)  Your next ECT Treatment is Day Friday Date February 23 at 815am  We will call 2 days prior to your scheduled appointment for arrival times.  7)  Nothing to eat or drink after midnight the night before your procedure.  8)  Take .    With a sip of water the morning of your procedure.  9)  Other Instructions: Call [phone] to cancel the morning of your procedure due         to illness or emergency.  10) We will call within 72 hours to assess how you are feeling.

## 2016-12-27 NOTE — Anesthesia Procedure Notes (Signed)
Date/Time: 12/27/2016 10:26 AM Performed by: Dionne Bucy Pre-anesthesia Checklist: Patient identified, Emergency Drugs available, Suction available and Patient being monitored Patient Re-evaluated:Patient Re-evaluated prior to inductionOxygen Delivery Method: Circle system utilized Preoxygenation: Pre-oxygenation with 100% oxygen Intubation Type: IV induction Ventilation: Mask ventilation without difficulty and Mask ventilation throughout procedure Airway Equipment and Method: Bite block Placement Confirmation: positive ETCO2 Dental Injury: Teeth and Oropharynx as per pre-operative assessment

## 2016-12-27 NOTE — H&P (Signed)
Melinda [name] is an 46 y.o. female.   Chief Complaint: No specific chief complaint. Mood stable HPI: Chronic depression managed with weekly ECT  Past Medical History:  Diagnosis Date  . Depression   . Diabetes mellitus without complication ([place])   . Diabetic peripheral neuropathy ([place]) 03/07/15  . Diabetic peripheral neuropathy ([place]) 03/07/15  . Diabetic peripheral neuropathy ([place]) 03/07/15  . GERD (gastroesophageal reflux disease)   . Hypercholesterolemia 03/07/15  . Hypertension   . Obesity 03/07/15  . Personality disorder 03/07/15  . Sinus tachycardia 03/07/15   history of  . Suicidal thoughts 03/07/15    Past Surgical History:  Procedure Laterality Date  . electroconvulsion therapy  03/07/15    Family History  Problem Relation Age of Onset  . Hypertension Father   . Diabetes Mother    Social History:  reports that she has never smoked. She has never used smokeless tobacco. She reports that she does not drink alcohol or use drugs.  Allergies:  Allergies  Allergen Reactions  . Prednisone     Increases blood sugar     (Not in a hospital admission)  Results for orders placed or performed during the hospital encounter of 12/27/16 (from the past 48 hour(s))  Glucose, capillary     Status: None   Collection Time: 12/27/16  8:28 AM  Result Value Ref Range   Glucose-Capillary 95 65 - 99 mg/dL   No results found.  Review of Systems  Constitutional: Negative.   HENT: Negative.   Eyes: Negative.   Respiratory: Negative.   Cardiovascular: Negative.   Gastrointestinal: Negative.   Musculoskeletal: Negative.   Skin: Negative.   Neurological: Negative.   Psychiatric/Behavioral: Negative.     Blood pressure (!) 147/78, pulse 98, temperature 97.6 F (36.4 C), temperature source Oral, resp. rate 16, height 5\' 4"  (1.626 m), weight 100.7 kg (222 lb), SpO2 100 %. Physical Exam  Nursing note and vitals reviewed. Constitutional: She appears well-developed and well-nourished.   HENT:  Head: Normocephalic and atraumatic.  Eyes: Conjunctivae are normal. Pupils are equal, round, and reactive to light.  Neck: Normal range of motion.  Cardiovascular: Regular rhythm and normal heart sounds.   Respiratory: Effort normal. No respiratory distress.  GI: Soft.  Musculoskeletal: Normal range of motion.  Neurological: She is alert.  Skin: Skin is warm and dry.  Psychiatric: She has a normal mood and affect. Her behavior is normal. Judgment and thought content normal.     Assessment/Plan Follow-up in 1 week continue bilateral treatment weekly which is very well tolerated.  [name], MD 12/27/2016, 10:13 AM

## 2017-01-03 ENCOUNTER — Other Ambulatory Visit: Payer: Self-pay | Admitting: Psychiatry

## 2017-01-03 ENCOUNTER — Encounter
Admission: RE | Admit: 2017-01-03 | Discharge: 2017-01-03 | Disposition: A | Discharge status: Home / Self Care | Payer: Medicare PPO | Source: Ambulatory Visit | Attending: Psychiatry | Admitting: Psychiatry

## 2017-01-03 ENCOUNTER — Encounter: Payer: Self-pay | Admitting: Anesthesiology

## 2017-01-03 DIAGNOSIS — E669 Obesity, unspecified: Secondary | ICD-10-CM | POA: Diagnosis not present

## 2017-01-03 DIAGNOSIS — E1142 Type 2 diabetes mellitus with diabetic polyneuropathy: Secondary | ICD-10-CM | POA: Insufficient documentation

## 2017-01-03 DIAGNOSIS — F329 Major depressive disorder, single episode, unspecified: Secondary | ICD-10-CM | POA: Diagnosis not present

## 2017-01-03 DIAGNOSIS — K219 Gastro-esophageal reflux disease without esophagitis: Secondary | ICD-10-CM | POA: Diagnosis not present

## 2017-01-03 DIAGNOSIS — F332 Major depressive disorder, recurrent severe without psychotic features: Secondary | ICD-10-CM | POA: Diagnosis not present

## 2017-01-03 DIAGNOSIS — Z833 Family history of diabetes mellitus: Secondary | ICD-10-CM | POA: Insufficient documentation

## 2017-01-03 DIAGNOSIS — F338 Other recurrent depressive disorders: Secondary | ICD-10-CM | POA: Diagnosis not present

## 2017-01-03 DIAGNOSIS — Z8249 Family history of ischemic heart disease and other diseases of the circulatory system: Secondary | ICD-10-CM | POA: Insufficient documentation

## 2017-01-03 DIAGNOSIS — Z6839 Body mass index (BMI) 39.0-39.9, adult: Secondary | ICD-10-CM | POA: Diagnosis not present

## 2017-01-03 DIAGNOSIS — I10 Essential (primary) hypertension: Secondary | ICD-10-CM | POA: Diagnosis not present

## 2017-01-03 DIAGNOSIS — F609 Personality disorder, unspecified: Secondary | ICD-10-CM | POA: Diagnosis not present

## 2017-01-03 DIAGNOSIS — E7800 Pure hypercholesterolemia, unspecified: Secondary | ICD-10-CM | POA: Diagnosis not present

## 2017-01-03 LAB — GLUCOSE, CAPILLARY: Glucose-Capillary: 92 mg/dL (ref 65–99)

## 2017-01-03 MED ORDER — LABETALOL HCL 5 MG/ML IV SOLN
INTRAVENOUS | Status: AC
  Filled 2017-01-03: qty 4

## 2017-01-03 MED ORDER — METHOHEXITAL SODIUM 0.5 G IJ SOLR
INTRAMUSCULAR | Status: AC
  Filled 2017-01-03: qty 500

## 2017-01-03 MED ORDER — KETOROLAC TROMETHAMINE 30 MG/ML IJ SOLN
30.0000 mg | Freq: Once | INTRAMUSCULAR | Status: AC
  Administered 2017-01-03: 30 mg via INTRAVENOUS

## 2017-01-03 MED ORDER — LABETALOL HCL 5 MG/ML IV SOLN
INTRAVENOUS | Status: DC | PRN
  Administered 2017-01-03: 20 mg via INTRAVENOUS

## 2017-01-03 MED ORDER — METHOHEXITAL SODIUM 100 MG/10ML IV SOSY
PREFILLED_SYRINGE | INTRAVENOUS | Status: DC | PRN
  Administered 2017-01-03: 70 mg via INTRAVENOUS

## 2017-01-03 MED ORDER — ESMOLOL HCL 100 MG/10ML IV SOLN
INTRAVENOUS | Status: DC | PRN
  Administered 2017-01-03: 20 mg via INTRAVENOUS

## 2017-01-03 MED ORDER — SUCCINYLCHOLINE CHLORIDE 200 MG/10ML IV SOSY
PREFILLED_SYRINGE | INTRAVENOUS | Status: DC | PRN
  Administered 2017-01-03: 100 mg via INTRAVENOUS

## 2017-01-03 MED ORDER — KETOROLAC TROMETHAMINE 30 MG/ML IJ SOLN
INTRAMUSCULAR | Status: AC
  Administered 2017-01-03: 30 mg via INTRAVENOUS
  Filled 2017-01-03: qty 1

## 2017-01-03 MED ORDER — SODIUM CHLORIDE 0.9 % IV SOLN
500.0000 mL | Freq: Once | INTRAVENOUS | Status: AC
  Administered 2017-01-03: 09:00:00 via INTRAVENOUS

## 2017-01-03 MED ORDER — GLYCOPYRROLATE 0.2 MG/ML IJ SOLN
INTRAMUSCULAR | Status: AC
  Administered 2017-01-03: 0.4 mg via INTRAVENOUS
  Filled 2017-01-03: qty 2

## 2017-01-03 MED ORDER — GLYCOPYRROLATE 0.2 MG/ML IJ SOLN
0.4000 mg | Freq: Once | INTRAMUSCULAR | Status: AC
  Administered 2017-01-03: 0.4 mg via INTRAVENOUS

## 2017-01-03 MED ORDER — ESMOLOL HCL 100 MG/10ML IV SOLN
INTRAVENOUS | Status: AC
  Filled 2017-01-03: qty 10

## 2017-01-03 MED ORDER — SODIUM CHLORIDE 0.9 % IV SOLN
INTRAVENOUS | Status: DC | PRN
  Administered 2017-01-03: 11:00:00 via INTRAVENOUS

## 2017-01-03 NOTE — Anesthesia Post-op Follow-up Note (Cosign Needed)
Anesthesia QCDR form completed.        

## 2017-01-03 NOTE — Transfer of Care (Signed)
Immediate Anesthesia Transfer of Care Note  Patient: Melinda [name]  Procedure(s) Performed: ECT  Patient Location: PACU  Anesthesia Type:General  Level of Consciousness: sedated  Airway & Oxygen Therapy: Patient Spontanous Breathing and Patient connected to face mask oxygen  Post-op Assessment: Report given to RN and Post -op Vital signs reviewed and stable  Post vital signs: Reviewed and stable  Last Vitals:  Vitals:   01/03/17 0825  BP: (!) 156/94  Pulse: 93  Resp: 18  Temp: 36.8 C    Last Pain:  Vitals:   01/03/17 0825  TempSrc: Oral         Complications: No apparent anesthesia complications

## 2017-01-03 NOTE — Anesthesia Procedure Notes (Signed)
Date/Time: 01/03/2017 10:54 AM Performed by: Dionne Bucy Pre-anesthesia Checklist: Patient identified, Emergency Drugs available, Suction available and Patient being monitored Patient Re-evaluated:Patient Re-evaluated prior to inductionOxygen Delivery Method: Circle system utilized Preoxygenation: Pre-oxygenation with 100% oxygen Intubation Type: IV induction Ventilation: Mask ventilation without difficulty and Mask ventilation throughout procedure Airway Equipment and Method: Bite block Placement Confirmation: positive ETCO2 Dental Injury: Teeth and Oropharynx as per pre-operative assessment

## 2017-01-03 NOTE — Anesthesia Postprocedure Evaluation (Signed)
Anesthesia Post Note  Patient: Melinda [name]  Procedure(s) Performed: * No procedures listed *  Patient location during evaluation: PACU Anesthesia Type: General Level of consciousness: awake and alert Pain management: pain level controlled Vital Signs Assessment: post-procedure vital signs reviewed and stable Respiratory status: spontaneous breathing, nonlabored ventilation, respiratory function stable and patient connected to nasal cannula oxygen Cardiovascular status: blood pressure returned to baseline and stable Postop Assessment: no signs of nausea or vomiting Anesthetic complications: no     Last Vitals:  Vitals:   01/03/17 1126 01/03/17 1141  BP: (!) 156/90 (!) 154/95  Pulse: 95 90  Resp: 16 18  Temp:      Last Pain:  Vitals:   01/03/17 0825  TempSrc: Oral                 Precious Haws Weber Monnier

## 2017-01-03 NOTE — Discharge Instructions (Signed)
1)  The drugs that you have been given will stay in your system until tomorrow so for the       next 24 hours you should not:  A. Drive an automobile  B. Make any legal decisions  C. Drink any alcoholic beverages  2)  You may resume your regular meals upon return home.  3)  A responsible adult must take you home.  Someone should stay with you for a few          hours, then be available by phone for the remainder of the treatment day.  4)  You May experience any of the following symptoms:  Headache, Nausea and a dry mouth (due to the medications you were given),  temporary memory loss and some confusion, or sore muscles (a warm bath  should help this).  If you you experience any of these symptoms let us know on                your return visit.  5)  Report any of the following: any acute discomfort, severe headache, or temperature        greater than 100.5 F.   Also report any unusual redness, swelling, drainage, or pain         at your IV site.    You may report Symptoms to:  [place] at [facility]          Phone: [phone], ECT Department           or Dr. Prescott Gum office [phone]  6)  Your next ECT Treatment is Day Friday  Date March 2 at 800am  We will call 2 days prior to your scheduled appointment for arrival times.  7)  Nothing to eat or drink after midnight the night before your procedure.  8)  Take .   With a sip of water the morning of your procedure.  9)  Other Instructions: Call [phone] to cancel the morning of your procedure due         to illness or emergency.  10) We will call within 72 hours to assess how you are feeling.

## 2017-01-03 NOTE — H&P (Signed)
Melinda [name] is an 46 y.o. female.    Chief Complaint: no new complaint HPI: chronic depresssion  Past Medical History:  Diagnosis Date  . Depression   . Diabetes mellitus without complication ([place])   . Diabetic peripheral neuropathy ([place]) 03/07/15  . Diabetic peripheral neuropathy ([place]) 03/07/15  . Diabetic peripheral neuropathy ([place]) 03/07/15  . GERD (gastroesophageal reflux disease)   . Hypercholesterolemia 03/07/15  . Hypertension   . Obesity 03/07/15  . Personality disorder 03/07/15  . Sinus tachycardia 03/07/15   history of  . Suicidal thoughts 03/07/15    Past Surgical History:  Procedure Laterality Date  . electroconvulsion therapy  03/07/15    Family History  Problem Relation Age of Onset  . Hypertension Father   . Diabetes Mother    Social History:  reports that she has never smoked. She has never used smokeless tobacco. She reports that she does not drink alcohol or use drugs.  Allergies:  Allergies  Allergen Reactions  . Prednisone     Increases blood sugar     (Not in a hospital admission)  Results for orders placed or performed during the hospital encounter of 01/03/17 (from the past 48 hour(s))  Glucose, capillary     Status: None   Collection Time: 01/03/17  8:25 AM  Result Value Ref Range   Glucose-Capillary 92 65 - 99 mg/dL   No results found.  Review of Systems  Constitutional: Negative.   HENT: Negative.   Eyes: Negative.   Respiratory: Negative.   Cardiovascular: Negative.   Gastrointestinal: Negative.   Musculoskeletal: Negative.   Skin: Negative.   Neurological: Negative.   Psychiatric/Behavioral: Positive for depression. Negative for hallucinations, memory loss, substance abuse and suicidal ideas. The patient is not nervous/anxious and does not have insomnia.     Blood pressure (!) 156/94, pulse 93, temperature 98.3 F (36.8 C), temperature source Oral, resp. rate 18, height 5\' 4"  (1.626 m), weight 103.4 kg (228 lb), SpO2 100  %. Physical Exam  Nursing note and vitals reviewed. Constitutional: She appears well-developed and well-nourished.  HENT:  Head: Normocephalic and atraumatic.  Eyes: Conjunctivae are normal. Pupils are equal, round, and reactive to light.  Neck: Normal range of motion.  Cardiovascular: Regular rhythm and normal heart sounds.   Respiratory: Effort normal. No respiratory distress.  GI: Soft.  Musculoskeletal: Normal range of motion.  Neurological: She is alert.  Skin: Skin is warm and dry.  Psychiatric: Judgment and thought content normal. Her affect is blunt. Her speech is delayed. She is slowed. Cognition and memory are normal.     Assessment/Plan Weekly treatment as usual schedule  [name], MD 01/03/2017, 10:44 AM

## 2017-01-03 NOTE — Procedures (Signed)
ECT SERVICES Physician's Interval Evaluation & Treatment Note  Patient Identification: Melinda [name] MRN:  [id number] Date of Evaluation:  01/03/2017 TX #: [id number]  MADRS:   MMSE:   P.E. Findings:  No change from baseline  Psychiatric Interval Note:  Chronic depression but no si  Subjective:  Patient is a 46 y.o. female seen for evaluation for Electroconvulsive Therapy. depression  Treatment Summary:   []   Right Unilateral             [x]  Bilateral   % Energy : 1.61ms 35%   Impedance: 1070 ohms  Seizure Energy Index: no reading  Postictal Suppression Index: no reading  Seizure Concordance Index: no reading  Medications  Pre Shock: tor 30mg , lab 20mg , es 10mg , brevitol 70mg , robinal 0.4mg , suc 100mg   Post Shock: none  Seizure Duration: 30se by emg, 30 sec by eeg   Comments: Fu one week   Lungs:  [x]   Clear to auscultation               []  Other:   Heart:    [x]   Regular rhythm             []  irregular rhythm    [x]   Previous H&P reviewed, patient examined and there are NO CHANGES                 []   Previous H&P reviewed, patient examined and there are changes noted.   [name], MD 2/23/201810:47 AM

## 2017-01-03 NOTE — Anesthesia Preprocedure Evaluation (Signed)
Anesthesia Evaluation  Patient identified by MRN, date of birth, ID band Patient awake    Reviewed: Allergy & Precautions, H&P , NPO status , Patient's Chart, lab work & pertinent test results  History of Anesthesia Complications Negative for: history of anesthetic complications  Airway Mallampati: II  TM Distance: >3 FB Neck ROM: full    Dental  (+) Poor Dentition, Chipped   Pulmonary sleep apnea , neg COPD,    Pulmonary exam normal breath sounds clear to auscultation       Cardiovascular hypertension, Pt. on medications (-) CAD and (-) Past MI negative cardio ROS Normal cardiovascular exam Rhythm:regular Rate:Normal     Neuro/Psych PSYCHIATRIC DISORDERS Depression Bipolar Disorder  Neuromuscular disease negative neurological ROS     GI/Hepatic negative GI ROS, Neg liver ROS, GERD  Medicated,  Endo/Other  diabetes, Type 2, Oral Hypoglycemic Agents  Renal/GU negative Renal ROS  negative genitourinary   Musculoskeletal   Abdominal (+) + obese,   Peds  Hematology negative hematology ROS (+)   Anesthesia Other Findings Past Medical History: No date: Depression No date: Diabetes mellitus without complication ([facility]) 03/07/15: Diabetic peripheral neuropathy ([facility]) 03/07/15: Diabetic peripheral neuropathy ([facility]) 03/07/15: Diabetic peripheral neuropathy ([facility]) No date: GERD (gastroesophageal reflux disease) 03/07/15: Hypercholesterolemia No date: Hypertension 03/07/15: Obesity 03/07/15: Personality disorder 03/07/15: Sinus tachycardia ([facility])     Comment: history of 03/07/15: Suicidal thoughts Past Surgical History: 03/07/15: electroconvulsion therapy BMI    Body Mass Index:  36.44 kg/m     Reproductive/Obstetrics                             Anesthesia Physical  Anesthesia Plan  ASA: III  Anesthesia Plan: General   Post-op Pain Management:    Induction: Intravenous  Airway Management  Planned: Mask  Additional Equipment:   Intra-op Plan:   Post-operative Plan:   Informed Consent: I have reviewed the patients History and Physical, chart, labs and discussed the procedure including the risks, benefits and alternatives for the proposed anesthesia with the patient or authorized representative who has indicated his/her understanding and acceptance.   Dental Advisory Given  Plan Discussed with: CRNA and Anesthesiologist  Anesthesia Plan Comments:         Anesthesia Quick Evaluation  

## 2017-01-10 ENCOUNTER — Other Ambulatory Visit: Payer: Self-pay | Admitting: Psychiatry

## 2017-01-10 ENCOUNTER — Encounter
Admission: RE | Admit: 2017-01-10 | Discharge: 2017-01-10 | Disposition: A | Discharge status: Home / Self Care | Payer: Medicare PPO | Source: Ambulatory Visit | Attending: Psychiatry | Admitting: Psychiatry

## 2017-01-10 ENCOUNTER — Encounter: Payer: Self-pay | Admitting: Anesthesiology

## 2017-01-10 DIAGNOSIS — E669 Obesity, unspecified: Secondary | ICD-10-CM | POA: Diagnosis not present

## 2017-01-10 DIAGNOSIS — F329 Major depressive disorder, single episode, unspecified: Secondary | ICD-10-CM | POA: Insufficient documentation

## 2017-01-10 DIAGNOSIS — E1142 Type 2 diabetes mellitus with diabetic polyneuropathy: Secondary | ICD-10-CM | POA: Diagnosis not present

## 2017-01-10 DIAGNOSIS — Z8249 Family history of ischemic heart disease and other diseases of the circulatory system: Secondary | ICD-10-CM | POA: Insufficient documentation

## 2017-01-10 DIAGNOSIS — E7800 Pure hypercholesterolemia, unspecified: Secondary | ICD-10-CM | POA: Diagnosis not present

## 2017-01-10 DIAGNOSIS — K219 Gastro-esophageal reflux disease without esophagitis: Secondary | ICD-10-CM | POA: Diagnosis not present

## 2017-01-10 DIAGNOSIS — Z6838 Body mass index (BMI) 38.0-38.9, adult: Secondary | ICD-10-CM | POA: Insufficient documentation

## 2017-01-10 DIAGNOSIS — I10 Essential (primary) hypertension: Secondary | ICD-10-CM | POA: Diagnosis not present

## 2017-01-10 DIAGNOSIS — Z833 Family history of diabetes mellitus: Secondary | ICD-10-CM | POA: Insufficient documentation

## 2017-01-10 DIAGNOSIS — E119 Type 2 diabetes mellitus without complications: Secondary | ICD-10-CM | POA: Diagnosis not present

## 2017-01-10 DIAGNOSIS — F338 Other recurrent depressive disorders: Secondary | ICD-10-CM | POA: Diagnosis not present

## 2017-01-10 DIAGNOSIS — F332 Major depressive disorder, recurrent severe without psychotic features: Secondary | ICD-10-CM | POA: Diagnosis not present

## 2017-01-10 DIAGNOSIS — Z888 Allergy status to other drugs, medicaments and biological substances status: Secondary | ICD-10-CM | POA: Insufficient documentation

## 2017-01-10 DIAGNOSIS — F609 Personality disorder, unspecified: Secondary | ICD-10-CM | POA: Diagnosis not present

## 2017-01-10 LAB — GLUCOSE, CAPILLARY: Glucose-Capillary: 95 mg/dL (ref 65–99)

## 2017-01-10 MED ORDER — GLYCOPYRROLATE 0.2 MG/ML IJ SOLN
0.4000 mg | Freq: Once | INTRAMUSCULAR | Status: AC
  Administered 2017-01-10: 0.4 mg via INTRAVENOUS

## 2017-01-10 MED ORDER — GLYCOPYRROLATE 0.2 MG/ML IJ SOLN
INTRAMUSCULAR | Status: AC
  Administered 2017-01-10: 0.4 mg via INTRAVENOUS
  Filled 2017-01-10: qty 2

## 2017-01-10 MED ORDER — SODIUM CHLORIDE 0.9 % IV SOLN
INTRAVENOUS | Status: DC | PRN
  Administered 2017-01-10: 10:00:00 via INTRAVENOUS

## 2017-01-10 MED ORDER — ESMOLOL HCL 100 MG/10ML IV SOLN
INTRAVENOUS | Status: AC
  Filled 2017-01-10: qty 10

## 2017-01-10 MED ORDER — KETOROLAC TROMETHAMINE 30 MG/ML IJ SOLN
INTRAMUSCULAR | Status: AC
Start: 2017-01-10 — End: 2017-01-10
  Filled 2017-01-10: qty 1

## 2017-01-10 MED ORDER — KETOROLAC TROMETHAMINE 30 MG/ML IJ SOLN
30.0000 mg | Freq: Once | INTRAMUSCULAR | Status: AC
  Administered 2017-01-10: 30 mg via INTRAVENOUS

## 2017-01-10 MED ORDER — LABETALOL HCL 5 MG/ML IV SOLN
INTRAVENOUS | Status: AC
  Filled 2017-01-10: qty 4

## 2017-01-10 MED ORDER — LABETALOL HCL 5 MG/ML IV SOLN
INTRAVENOUS | Status: DC | PRN
  Administered 2017-01-10: 20 mg via INTRAVENOUS

## 2017-01-10 MED ORDER — SODIUM CHLORIDE 0.9 % IV SOLN
500.0000 mL | Freq: Once | INTRAVENOUS | Status: AC
  Administered 2017-01-10: 09:00:00 via INTRAVENOUS

## 2017-01-10 MED ORDER — FENTANYL CITRATE (PF) 100 MCG/2ML IJ SOLN: 25.0000 ug | INTRAMUSCULAR | Status: DC | PRN

## 2017-01-10 MED ORDER — ONDANSETRON HCL 4 MG/2ML IJ SOLN: 4.0000 mg | Freq: Once | INTRAMUSCULAR | Status: DC | PRN

## 2017-01-10 MED ORDER — METHOHEXITAL SODIUM 100 MG/10ML IV SOSY
PREFILLED_SYRINGE | INTRAVENOUS | Status: DC | PRN
  Administered 2017-01-10: 70 mg via INTRAVENOUS

## 2017-01-10 MED ORDER — METHOHEXITAL SODIUM 0.5 G IJ SOLR
INTRAMUSCULAR | Status: AC
  Filled 2017-01-10: qty 500

## 2017-01-10 MED ORDER — ESMOLOL HCL 100 MG/10ML IV SOLN
INTRAVENOUS | Status: DC | PRN
  Administered 2017-01-10: 20 mg via INTRAVENOUS

## 2017-01-10 MED ORDER — SUCCINYLCHOLINE CHLORIDE 200 MG/10ML IV SOSY
PREFILLED_SYRINGE | INTRAVENOUS | Status: DC | PRN
  Administered 2017-01-10: 100 mg via INTRAVENOUS

## 2017-01-10 NOTE — Procedures (Signed)
ECT SERVICES Physician's Interval Evaluation & Treatment Note  Patient Identification: Melinda [name] MRN:  [id number] Date of Evaluation:  01/10/2017 TX #: [id number]  MADRS:   MMSE:   P.E. Findings:  No change to physical exam  Psychiatric Interval Note:  Mildly depressed not too far off baseline not suicidal  Subjective:  Patient is a 46 y.o. female seen for evaluation for Electroconvulsive Therapy. No specific new complaint  Treatment Summary:   []   Right Unilateral             [x]  Bilateral   % Energy : 1.0 ms 35%   Impedance: 1590 ohms  Seizure Energy Index: Computer did not make a reading  Postictal Suppression Index: Computer did not make a reading for reasons I as usual did not understand but everything actually looks fine with pretty normal suppression  Seizure Concordance Index: 98%  Medications  Pre Shock: Robinul 0.4 mg Toradol 30 mg labetalol 20 mg esmolol 10 mg Brevital 70 mg succinylcholine 100 mg  Post Shock:    Seizure Duration: 40 seconds by EMG 78 seconds by EEG   Comments: Follow-up one week   Lungs:  [x]   Clear to auscultation               []  Other:   Heart:    [x]   Regular rhythm             []  irregular rhythm    [x]   Previous H&P reviewed, patient examined and there are NO CHANGES                 []   Previous H&P reviewed, patient examined and there are changes noted.   [name], MD 3/2/201810:09 AM

## 2017-01-10 NOTE — Anesthesia Postprocedure Evaluation (Signed)
Anesthesia Post Note  Patient: Melinda [name]  Procedure(s) Performed: * No procedures listed *  Patient location during evaluation: PACU Anesthesia Type: General Level of consciousness: awake and alert Pain management: pain level controlled Vital Signs Assessment: post-procedure vital signs reviewed and stable Respiratory status: spontaneous breathing, nonlabored ventilation, respiratory function stable and patient connected to nasal cannula oxygen Cardiovascular status: blood pressure returned to baseline and stable Postop Assessment: no signs of nausea or vomiting Anesthetic complications: no     Last Vitals:  Vitals:   01/10/17 1056 01/10/17 1104  BP: 125/68   Pulse: 85 78  Resp: 16 16  Temp:  36.8 C    Last Pain:  Vitals:   01/10/17 1104  TempSrc: Oral  PainSc:                  Lynnette Pote S

## 2017-01-10 NOTE — Anesthesia Procedure Notes (Signed)
Date/Time: 01/10/2017 10:16 AM Performed by: Dionne Bucy Pre-anesthesia Checklist: Patient identified, Emergency Drugs available, Suction available and Patient being monitored Patient Re-evaluated:Patient Re-evaluated prior to inductionOxygen Delivery Method: Circle system utilized Preoxygenation: Pre-oxygenation with 100% oxygen Intubation Type: IV induction Ventilation: Mask ventilation without difficulty and Mask ventilation throughout procedure Airway Equipment and Method: Bite block Placement Confirmation: positive ETCO2 Dental Injury: Teeth and Oropharynx as per pre-operative assessment

## 2017-01-10 NOTE — H&P (Signed)
Melinda [name] is an 46 y.o. female.   Chief Complaint: Patient with chronic depression. Seems a little more down this week. Nothing new. HPI: History of recurrent severe depression doing well with maintenance weekly ECT  Past Medical History:  Diagnosis Date  . Depression   . Diabetes mellitus without complication ([place])   . Diabetic peripheral neuropathy ([place]) 03/07/15  . Diabetic peripheral neuropathy ([place]) 03/07/15  . Diabetic peripheral neuropathy ([place]) 03/07/15  . GERD (gastroesophageal reflux disease)   . Hypercholesterolemia 03/07/15  . Hypertension   . Obesity 03/07/15  . Personality disorder 03/07/15  . Sinus tachycardia 03/07/15   history of  . Suicidal thoughts 03/07/15    Past Surgical History:  Procedure Laterality Date  . electroconvulsion therapy  03/07/15    Family History  Problem Relation Age of Onset  . Hypertension Father   . Diabetes Mother    Social History:  reports that she has never smoked. She has never used smokeless tobacco. She reports that she does not drink alcohol or use drugs.  Allergies:  Allergies  Allergen Reactions  . Prednisone     Increases blood sugar     (Not in a hospital admission)  Results for orders placed or performed during the hospital encounter of 01/10/17 (from the past 48 hour(s))  Glucose, capillary     Status: None   Collection Time: 01/10/17  8:33 AM  Result Value Ref Range   Glucose-Capillary 95 65 - 99 mg/dL   No results found.  Review of Systems  Constitutional: Negative.   HENT: Negative.   Eyes: Negative.   Respiratory: Negative.   Cardiovascular: Negative.   Gastrointestinal: Negative.   Musculoskeletal: Negative.   Skin: Negative.   Neurological: Negative.   Psychiatric/Behavioral: Positive for depression. Negative for hallucinations, memory loss, substance abuse and suicidal ideas. The patient is not nervous/anxious and does not have insomnia.     Blood pressure 126/70, pulse 82, temperature 98 F  (36.7 C), temperature source Oral, resp. rate 16, height 5\' 4"  (1.626 m), weight 103 kg (227 lb), SpO2 100 %. Physical Exam  Nursing note and vitals reviewed. Constitutional: She appears well-developed and well-nourished.  HENT:  Head: Normocephalic and atraumatic.  Eyes: Conjunctivae are normal. Pupils are equal, round, and reactive to light.  Neck: Normal range of motion.  Cardiovascular: Regular rhythm and normal heart sounds.   Respiratory: Effort normal.  GI: Soft.  Musculoskeletal: Normal range of motion.  Neurological: She is alert.  Skin: Skin is warm and dry.  Psychiatric: Judgment and thought content normal. Her speech is delayed. She is slowed. Cognition and memory are normal. She exhibits a depressed mood.     Assessment/Plan Treatment today follow-up one week no change to overall treatment plan.  [name], MD 01/10/2017, 10:08 AM

## 2017-01-10 NOTE — Discharge Instructions (Signed)
1)  The drugs that you have been given will stay in your system until tomorrow so for the       next 24 hours you should not:  A. Drive an automobile  B. Make any legal decisions  C. Drink any alcoholic beverages  2)  You may resume your regular meals upon return home.  3)  A responsible adult must take you home.  Someone should stay with you for a few          hours, then be available by phone for the remainder of the treatment day.  4)  You May experience any of the following symptoms:  Headache, Nausea and a dry mouth (due to the medications you were given),  temporary memory loss and some confusion, or sore muscles (a warm bath  should help this).  If you you experience any of these symptoms let us know on                your return visit.  5)  Report any of the following: any acute discomfort, severe headache, or temperature        greater than 100.5 F.   Also report any unusual redness, swelling, drainage, or pain         at your IV site.    You may report Symptoms to:  [place] [facility]          Phone: [phone], ECT Department           or Dr. Prescott Gum office [phone]  6)  Your next ECT Treatment is Friday January 17, 2017  We will call 2 days prior to your scheduled appointment for arrival times.  7)  Nothing to eat or drink after midnight the night before your procedure.  8)      With a sip of water the morning of your procedure.  9)  Other Instructions: Call [phone] to cancel the morning of your procedure due         to illness or emergency.  10) We will call within 72 hours to assess how you are feeling.

## 2017-01-10 NOTE — Anesthesia Post-op Follow-up Note (Cosign Needed)
Anesthesia QCDR form completed.        

## 2017-01-10 NOTE — Transfer of Care (Signed)
Immediate Anesthesia Transfer of Care Note  Patient: Melinda [name]  Procedure(s) Performed: ECT  Patient Location: PACU  Anesthesia Type:General  Level of Consciousness: sedated  Airway & Oxygen Therapy: Patient Spontanous Breathing and Patient connected to face mask oxygen  Post-op Assessment: Report given to RN and Post -op Vital signs reviewed and stable  Post vital signs: Reviewed and stable  Last Vitals:  Vitals:   01/10/17 0842  BP: 126/70  Pulse: 82  Resp: 16  Temp: 36.7 C    Last Pain:  Vitals:   01/10/17 0842  TempSrc: Oral         Complications: No apparent anesthesia complications

## 2017-01-10 NOTE — Anesthesia Preprocedure Evaluation (Signed)
Anesthesia Evaluation  Patient identified by MRN, date of birth, ID band Patient awake    Reviewed: Allergy & Precautions, NPO status , Patient's Chart, lab work & pertinent test results, reviewed documented beta blocker date and time   Airway Mallampati: III  TM Distance: >3 FB     Dental  (+) Chipped   Pulmonary sleep apnea ,           Cardiovascular hypertension, Pt. on medications      Neuro/Psych PSYCHIATRIC DISORDERS Depression Bipolar Disorder  Neuromuscular disease    GI/Hepatic GERD  Controlled,  Endo/Other  diabetes, Type 2  Renal/GU      Musculoskeletal   Abdominal   Peds  Hematology   Anesthesia Other Findings   Reproductive/Obstetrics                             Anesthesia Physical Anesthesia Plan  ASA: III  Anesthesia Plan: General   Post-op Pain Management:    Induction: Intravenous  Airway Management Planned: Mask  Additional Equipment:   Intra-op Plan:   Post-operative Plan:   Informed Consent: I have reviewed the patients History and Physical, chart, labs and discussed the procedure including the risks, benefits and alternatives for the proposed anesthesia with the patient or authorized representative who has indicated his/her understanding and acceptance.     Plan Discussed with: CRNA  Anesthesia Plan Comments:         Anesthesia Quick Evaluation

## 2017-01-16 ENCOUNTER — Other Ambulatory Visit: Payer: Self-pay | Admitting: Psychiatry

## 2017-01-17 ENCOUNTER — Encounter: Payer: Self-pay | Admitting: Anesthesiology

## 2017-01-17 ENCOUNTER — Ambulatory Visit
Admission: RE | Admit: 2017-01-17 | Discharge: 2017-01-17 | Disposition: A | Discharge status: Home / Self Care | Payer: Medicare PPO | Source: Ambulatory Visit | Attending: Psychiatry | Admitting: Psychiatry

## 2017-01-17 DIAGNOSIS — E1142 Type 2 diabetes mellitus with diabetic polyneuropathy: Secondary | ICD-10-CM | POA: Diagnosis not present

## 2017-01-17 DIAGNOSIS — E7800 Pure hypercholesterolemia, unspecified: Secondary | ICD-10-CM | POA: Diagnosis not present

## 2017-01-17 DIAGNOSIS — F332 Major depressive disorder, recurrent severe without psychotic features: Secondary | ICD-10-CM | POA: Diagnosis not present

## 2017-01-17 DIAGNOSIS — F329 Major depressive disorder, single episode, unspecified: Secondary | ICD-10-CM | POA: Insufficient documentation

## 2017-01-17 DIAGNOSIS — F3130 Bipolar disorder, current episode depressed, mild or moderate severity, unspecified: Secondary | ICD-10-CM | POA: Diagnosis not present

## 2017-01-17 DIAGNOSIS — K219 Gastro-esophageal reflux disease without esophagitis: Secondary | ICD-10-CM | POA: Insufficient documentation

## 2017-01-17 DIAGNOSIS — E669 Obesity, unspecified: Secondary | ICD-10-CM | POA: Insufficient documentation

## 2017-01-17 DIAGNOSIS — I10 Essential (primary) hypertension: Secondary | ICD-10-CM | POA: Diagnosis not present

## 2017-01-17 LAB — GLUCOSE, CAPILLARY: Glucose-Capillary: 89 mg/dL (ref 65–99)

## 2017-01-17 MED ORDER — GLYCOPYRROLATE 0.2 MG/ML IJ SOLN
0.4000 mg | Freq: Once | INTRAMUSCULAR | Status: AC
  Administered 2017-01-17: 0.4 mg via INTRAVENOUS

## 2017-01-17 MED ORDER — LABETALOL HCL 5 MG/ML IV SOLN
INTRAVENOUS | Status: DC | PRN
  Administered 2017-01-17: 20 mg via INTRAVENOUS

## 2017-01-17 MED ORDER — KETOROLAC TROMETHAMINE 30 MG/ML IJ SOLN
30.0000 mg | Freq: Once | INTRAMUSCULAR | Status: AC
  Administered 2017-01-17: 30 mg via INTRAVENOUS

## 2017-01-17 MED ORDER — ESMOLOL HCL 100 MG/10ML IV SOLN
INTRAVENOUS | Status: DC | PRN
  Administered 2017-01-17: 20 mg via INTRAVENOUS

## 2017-01-17 MED ORDER — METHOHEXITAL SODIUM 100 MG/10ML IV SOSY
PREFILLED_SYRINGE | INTRAVENOUS | Status: DC | PRN
  Administered 2017-01-17: 70 mg via INTRAVENOUS

## 2017-01-17 MED ORDER — ESMOLOL HCL 100 MG/10ML IV SOLN
INTRAVENOUS | Status: AC
  Filled 2017-01-17: qty 10

## 2017-01-17 MED ORDER — SUCCINYLCHOLINE CHLORIDE 20 MG/ML IJ SOLN
INTRAMUSCULAR | Status: DC | PRN
  Administered 2017-01-17: 100 mg via INTRAVENOUS

## 2017-01-17 MED ORDER — LABETALOL HCL 5 MG/ML IV SOLN
INTRAVENOUS | Status: AC
  Filled 2017-01-17: qty 4

## 2017-01-17 MED ORDER — GLYCOPYRROLATE 0.2 MG/ML IJ SOLN
INTRAMUSCULAR | Status: AC
  Filled 2017-01-17: qty 2

## 2017-01-17 MED ORDER — KETOROLAC TROMETHAMINE 30 MG/ML IJ SOLN
INTRAMUSCULAR | Status: AC
  Filled 2017-01-17: qty 1

## 2017-01-17 MED ORDER — SODIUM CHLORIDE 0.9 % IV SOLN
500.0000 mL | Freq: Once | INTRAVENOUS | Status: AC
  Administered 2017-01-17: 1000 mL via INTRAVENOUS

## 2017-01-17 MED ORDER — SODIUM CHLORIDE 0.9 % IV SOLN
INTRAVENOUS | Status: DC | PRN
  Administered 2017-01-17: 08:00:00 via INTRAVENOUS

## 2017-01-17 NOTE — Procedures (Signed)
ECT SERVICES Physician's Interval Evaluation & Treatment Note  Patient Identification: Melinda [name] MRN:  [id number] Date of Evaluation:  01/17/2017 TX #: [id number]  MADRS:   MMSE:   P.E. Findings:  No change to physical exam vital stable  Psychiatric Interval Note:  Mood pretty stable not significantly depressed today  Subjective:  Patient is a 46 y.o. female seen for evaluation for Electroconvulsive Therapy. No specific new complaint  Treatment Summary:   []   Right Unilateral             [x]  Bilateral   % Energy : 1.0 ms 35%   Impedance: 910 ohms  Seizure Energy Index: 3470 V squared  Postictal Suppression Index: 75%  Seizure Concordance Index: 92%  Medications  Pre Shock: Robinul 0.4 mg, labetalol 20 mg, esmolol 10 mg, Toradol 30 mg Brevital 70 mg succinylcholine 100 mg  Post Shock:    Seizure Duration: 42 seconds by EMG 78 seconds by EEG   Comments: Follow-up one week   Lungs:  [x]   Clear to auscultation               []  Other:   Heart:    [x]   Regular rhythm             []  irregular rhythm    [x]   Previous H&P reviewed, patient examined and there are NO CHANGES                 []   Previous H&P reviewed, patient examined and there are changes noted.   [name], MD 3/9/201810:46 AM

## 2017-01-17 NOTE — Transfer of Care (Signed)
Immediate Anesthesia Transfer of Care Note  Patient: Melinda [name]  Procedure(s) Performed: * No procedures listed *  Patient Location: PACU  Anesthesia Type:General  Level of Consciousness: awake and responds to stimulation  Airway & Oxygen Therapy: Patient Spontanous Breathing and Patient connected to face mask oxygen  Post-op Assessment: Report given to RN and Post -op Vital signs reviewed and stable  Post vital signs: Reviewed and stable  Last Vitals:  Vitals:   01/17/17 0821 01/17/17 1101  BP: (!) 160/89 (!) 144/66  Pulse: 85 99  Resp: 16 17  Temp: 36.7 C     Last Pain:  Vitals:   01/17/17 0824  TempSrc:   PainSc: 0-No pain         Complications: No apparent anesthesia complications

## 2017-01-17 NOTE — Anesthesia Procedure Notes (Signed)
Performed by: Arno Cullers Pre-anesthesia Checklist: Patient identified, Emergency Drugs available, Suction available and Patient being monitored Patient Re-evaluated:Patient Re-evaluated prior to inductionOxygen Delivery Method: Circle system utilized Preoxygenation: Pre-oxygenation with 100% oxygen Intubation Type: IV induction Ventilation: Mask ventilation without difficulty and Mask ventilation throughout procedure Airway Equipment and Method: Bite block Placement Confirmation: positive ETCO2 Dental Injury: Teeth and Oropharynx as per pre-operative assessment        

## 2017-01-17 NOTE — Anesthesia Post-op Follow-up Note (Cosign Needed)
Anesthesia QCDR form completed.        

## 2017-01-17 NOTE — H&P (Signed)
Melinda [name] is an 46 y.o. female.   Chief Complaint: No new complaint. Physically stable. Chronic recurrent depression HPI: Major depression which is responsive but well controlled with weekly ECT. Units treatment continuing without any change  Past Medical History:  Diagnosis Date  . Depression   . Diabetes mellitus without complication ([place])   . Diabetic peripheral neuropathy ([place]) 03/07/15  . Diabetic peripheral neuropathy ([place]) 03/07/15  . Diabetic peripheral neuropathy ([place]) 03/07/15  . GERD (gastroesophageal reflux disease)   . Hypercholesterolemia 03/07/15  . Hypertension   . Obesity 03/07/15  . Personality disorder 03/07/15  . Sinus tachycardia 03/07/15   history of  . Suicidal thoughts 03/07/15    Past Surgical History:  Procedure Laterality Date  . electroconvulsion therapy  03/07/15    Family History  Problem Relation Age of Onset  . Hypertension Father   . Diabetes Mother    Social History:  reports that she has never smoked. She has never used smokeless tobacco. She reports that she does not drink alcohol or use drugs.  Allergies:  Allergies  Allergen Reactions  . Prednisone     Increases blood sugar     (Not in a hospital admission)  Results for orders placed or performed during the hospital encounter of 01/17/17 (from the past 48 hour(s))  Glucose, capillary     Status: None   Collection Time: 01/17/17  8:11 AM  Result Value Ref Range   Glucose-Capillary 89 65 - 99 mg/dL   No results found.  Review of Systems  Constitutional: Negative.   HENT: Negative.   Eyes: Negative.   Respiratory: Negative.   Cardiovascular: Negative.   Gastrointestinal: Negative.   Musculoskeletal: Negative.   Skin: Negative.   Neurological: Negative.   Psychiatric/Behavioral: Negative.     Blood pressure (!) 160/89, pulse 85, temperature 98 F (36.7 C), temperature source Oral, resp. rate 16, height 5\' 5"  (1.651 m), weight 104.8 kg (231 lb), SpO2 100 %. Physical Exam   Nursing note and vitals reviewed. Constitutional: She appears well-developed and well-nourished.  HENT:  Head: Normocephalic and atraumatic.  Eyes: Conjunctivae are normal. Pupils are equal, round, and reactive to light.  Neck: Normal range of motion.  Cardiovascular: Regular rhythm and normal heart sounds.   Respiratory: Effort normal and breath sounds normal. No respiratory distress.  GI: Soft.  Musculoskeletal: Normal range of motion.  Neurological: She is alert.  Skin: Skin is warm and dry.  Psychiatric: She has a normal mood and affect. Her behavior is normal. Judgment and thought content normal.     Assessment/Plan Follow-up one week  [name], MD 01/17/2017, 10:45 AM

## 2017-01-17 NOTE — Discharge Instructions (Signed)
1)  The drugs that you have been given will stay in your system until tomorrow so for the       next 24 hours you should not:  A. Drive an automobile  B. Make any legal decisions  C. Drink any alcoholic beverages  2)  You may resume your regular meals upon return home.  3)  A responsible adult must take you home.  Someone should stay with you for a few          hours, then be available by phone for the remainder of the treatment day.  4)  You May experience any of the following symptoms:  Headache, Nausea and a dry mouth (due to the medications you were given),  temporary memory loss and some confusion, or sore muscles (a warm bath  should help this).  If you you experience any of these symptoms let us know on                your return visit.  5)  Report any of the following: any acute discomfort, severe headache, or temperature        greater than 100.5 F.   Also report any unusual redness, swelling, drainage, or pain         at your IV site.    You may report Symptoms to:  [place] at [facility]          Phone: [phone], ECT Department           or Dr. Prescott Gum office [phone]  6)  Your next ECT Treatment is Day Friday  Date March 16  We will call 2 days prior to your scheduled appointment for arrival times.  7)  Nothing to eat or drink after midnight the night before your procedure.  8)  Take Lisinopril     With a sip of water the morning of your procedure.  9)  Other Instructions: Call [phone] to cancel the morning of your procedure due         to illness or emergency.  10) We will call within 72 hours to assess how you are feeling.

## 2017-01-17 NOTE — Anesthesia Preprocedure Evaluation (Signed)
Anesthesia Evaluation  Patient identified by MRN, date of birth, ID band Patient awake    Reviewed: Allergy & Precautions, H&P , NPO status , Patient's Chart, lab work & pertinent test results  History of Anesthesia Complications Negative for: history of anesthetic complications  Airway Mallampati: II  TM Distance: >3 FB Neck ROM: full    Dental  (+) Poor Dentition, Chipped   Pulmonary sleep apnea , neg COPD,    Pulmonary exam normal breath sounds clear to auscultation       Cardiovascular hypertension, Pt. on medications (-) CAD and (-) Past MI negative cardio ROS Normal cardiovascular exam Rhythm:regular Rate:Normal     Neuro/Psych PSYCHIATRIC DISORDERS Depression Bipolar Disorder  Neuromuscular disease negative neurological ROS     GI/Hepatic negative GI ROS, Neg liver ROS, GERD  Medicated,  Endo/Other  diabetes, Type 2, Oral Hypoglycemic Agents  Renal/GU negative Renal ROS  negative genitourinary   Musculoskeletal   Abdominal (+) + obese,   Peds  Hematology negative hematology ROS (+)   Anesthesia Other Findings Past Medical History: No date: Depression No date: Diabetes mellitus without complication ([place]) 0/78/67: Diabetic peripheral neuropathy ([place]) 03/07/15: Diabetic peripheral neuropathy ([place]) 03/07/15: Diabetic peripheral neuropathy ([facility]) No date: GERD (gastroesophageal reflux disease) 03/07/15: Hypercholesterolemia No date: Hypertension 03/07/15: Obesity 03/07/15: Personality disorder 03/07/15: Sinus tachycardia ([place])     Comment: history of 03/07/15: Suicidal thoughts Past Surgical History: 03/07/15: electroconvulsion therapy BMI    Body Mass Index:  36.44 kg/m     Reproductive/Obstetrics                             Anesthesia Physical  Anesthesia Plan  ASA: III  Anesthesia Plan: General   Post-op Pain Management:    Induction: Intravenous  Airway Management  Planned: Mask  Additional Equipment:   Intra-op Plan:   Post-operative Plan:   Informed Consent: I have reviewed the patients History and Physical, chart, labs and discussed the procedure including the risks, benefits and alternatives for the proposed anesthesia with the patient or authorized representative who has indicated his/her understanding and acceptance.   Dental Advisory Given  Plan Discussed with: CRNA and Anesthesiologist  Anesthesia Plan Comments:         Anesthesia Quick Evaluation

## 2017-01-17 NOTE — Anesthesia Postprocedure Evaluation (Signed)
Anesthesia Post Note  Patient: Melinda [name]  Procedure(s) Performed: * No procedures listed *  Patient location during evaluation: PACU Anesthesia Type: General Level of consciousness: awake and alert Pain management: pain level controlled Vital Signs Assessment: post-procedure vital signs reviewed and stable Respiratory status: spontaneous breathing, nonlabored ventilation, respiratory function stable and patient connected to nasal cannula oxygen Cardiovascular status: blood pressure returned to baseline and stable Postop Assessment: no signs of nausea or vomiting Anesthetic complications: no     Last Vitals:  Vitals:   01/17/17 1131 01/17/17 1136  BP: (!) 142/77 118/76  Pulse: 86 79  Resp: 20 18  Temp:  37 C    Last Pain:  Vitals:   01/17/17 1136  TempSrc: Oral  PainSc: 0-No pain                 Precious Haws Carlson Belland

## 2017-01-24 ENCOUNTER — Other Ambulatory Visit: Payer: Self-pay | Admitting: Psychiatry

## 2017-01-24 ENCOUNTER — Encounter: Payer: Self-pay | Admitting: Anesthesiology

## 2017-01-24 ENCOUNTER — Ambulatory Visit
Admission: RE | Admit: 2017-01-24 | Discharge: 2017-01-24 | Disposition: A | Discharge status: Home / Self Care | Payer: Medicare PPO | Source: Ambulatory Visit | Attending: Psychiatry | Admitting: Psychiatry

## 2017-01-24 DIAGNOSIS — E669 Obesity, unspecified: Secondary | ICD-10-CM | POA: Diagnosis not present

## 2017-01-24 DIAGNOSIS — K219 Gastro-esophageal reflux disease without esophagitis: Secondary | ICD-10-CM | POA: Insufficient documentation

## 2017-01-24 DIAGNOSIS — I10 Essential (primary) hypertension: Secondary | ICD-10-CM | POA: Insufficient documentation

## 2017-01-24 DIAGNOSIS — F339 Major depressive disorder, recurrent, unspecified: Secondary | ICD-10-CM | POA: Diagnosis not present

## 2017-01-24 DIAGNOSIS — E1142 Type 2 diabetes mellitus with diabetic polyneuropathy: Secondary | ICD-10-CM | POA: Diagnosis not present

## 2017-01-24 DIAGNOSIS — E7800 Pure hypercholesterolemia, unspecified: Secondary | ICD-10-CM | POA: Insufficient documentation

## 2017-01-24 DIAGNOSIS — F332 Major depressive disorder, recurrent severe without psychotic features: Secondary | ICD-10-CM | POA: Diagnosis not present

## 2017-01-24 DIAGNOSIS — F3130 Bipolar disorder, current episode depressed, mild or moderate severity, unspecified: Secondary | ICD-10-CM | POA: Diagnosis not present

## 2017-01-24 LAB — GLUCOSE, CAPILLARY: Glucose-Capillary: 93 mg/dL (ref 65–99)

## 2017-01-24 MED ORDER — SODIUM CHLORIDE 0.9 % IV SOLN
500.0000 mL | Freq: Once | INTRAVENOUS | Status: AC
  Administered 2017-01-24: 1000 mL via INTRAVENOUS

## 2017-01-24 MED ORDER — KETOROLAC TROMETHAMINE 30 MG/ML IJ SOLN
30.0000 mg | Freq: Once | INTRAMUSCULAR | Status: AC
  Administered 2017-01-24: 30 mg via INTRAVENOUS

## 2017-01-24 MED ORDER — KETOROLAC TROMETHAMINE 30 MG/ML IJ SOLN
INTRAMUSCULAR | Status: AC
  Administered 2017-01-24: 30 mg via INTRAVENOUS
  Filled 2017-01-24: qty 1

## 2017-01-24 MED ORDER — GLYCOPYRROLATE 0.2 MG/ML IJ SOLN
INTRAMUSCULAR | Status: AC
  Administered 2017-01-24: 0.4 mg via INTRAVENOUS
  Filled 2017-01-24: qty 2

## 2017-01-24 MED ORDER — METHOHEXITAL SODIUM 100 MG/10ML IV SOSY
PREFILLED_SYRINGE | INTRAVENOUS | Status: DC | PRN
  Administered 2017-01-24: 70 mg via INTRAVENOUS

## 2017-01-24 MED ORDER — ESMOLOL HCL 100 MG/10ML IV SOLN
INTRAVENOUS | Status: AC
  Filled 2017-01-24: qty 10

## 2017-01-24 MED ORDER — GLYCOPYRROLATE 0.2 MG/ML IJ SOLN
0.4000 mg | Freq: Once | INTRAMUSCULAR | Status: AC
  Administered 2017-01-24: 0.4 mg via INTRAVENOUS

## 2017-01-24 MED ORDER — SUCCINYLCHOLINE CHLORIDE 200 MG/10ML IV SOSY
PREFILLED_SYRINGE | INTRAVENOUS | Status: DC | PRN
  Administered 2017-01-24: 100 mg via INTRAVENOUS

## 2017-01-24 MED ORDER — SODIUM CHLORIDE 0.9 % IV SOLN
INTRAVENOUS | Status: DC | PRN
  Administered 2017-01-24: 10:00:00 via INTRAVENOUS

## 2017-01-24 MED ORDER — LABETALOL HCL 5 MG/ML IV SOLN
INTRAVENOUS | Status: DC | PRN
  Administered 2017-01-24: 20 mg via INTRAVENOUS

## 2017-01-24 MED ORDER — ESMOLOL HCL 100 MG/10ML IV SOLN
INTRAVENOUS | Status: DC | PRN
  Administered 2017-01-24: 20 mg via INTRAVENOUS

## 2017-01-24 MED ORDER — METHOHEXITAL SODIUM 0.5 G IJ SOLR
INTRAMUSCULAR | Status: AC
  Filled 2017-01-24: qty 500

## 2017-01-24 MED ORDER — SUCCINYLCHOLINE CHLORIDE 20 MG/ML IJ SOLN
INTRAMUSCULAR | Status: AC
  Filled 2017-01-24: qty 1

## 2017-01-24 NOTE — Discharge Instructions (Addendum)
1)  The drugs that you have been given will stay in your system until tomorrow so for the       next 24 hours you should not:  A. Drive an automobile  B. Make any legal decisions  C. Drink any alcoholic beverages  2)  You may resume your regular meals upon return home.  3)  A responsible adult must take you home.  Someone should stay with you for a few          hours, then be available by phone for the remainder of the treatment day.  4)  You May experience any of the following symptoms:  Headache, Nausea and a dry mouth (due to the medications you were given),  temporary memory loss and some confusion, or sore muscles (a warm bath  should help this).  If you you experience any of these symptoms let us know on                your return visit.  5)  Report any of the following: any acute discomfort, severe headache, or temperature        greater than 100.5 F.   Also report any unusual redness, swelling, drainage, or pain         at your IV site.    You may report Symptoms to:  [place] at [facility]          Phone: [phone], ECT Department           or Dr. Prescott Gum office [phone]  6)  Your next ECT Treatment is Friday, March 23.  We will call 2 days prior to your scheduled appointment for arrival times.  7)  Nothing to eat or drink after midnight the night before your procedure.  8)  Take lisinopril  With a sip of water the morning of your procedure.  9)  Other Instructions: Call [phone] to cancel the morning of your procedure due         to illness or emergency.  10) We will call within 72 hours to assess how you are feeling.

## 2017-01-24 NOTE — Anesthesia Preprocedure Evaluation (Signed)
Anesthesia Evaluation  Patient identified by MRN, date of birth, ID band Patient awake    Reviewed: Allergy & Precautions, H&P , NPO status , Patient's Chart, lab work & pertinent test results  History of Anesthesia Complications Negative for: history of anesthetic complications  Airway Mallampati: II  TM Distance: >3 FB Neck ROM: full    Dental  (+) Poor Dentition, Chipped   Pulmonary sleep apnea , neg COPD,    Pulmonary exam normal breath sounds clear to auscultation       Cardiovascular hypertension, Pt. on medications (-) CAD and (-) Past MI negative cardio ROS Normal cardiovascular exam Rhythm:regular Rate:Normal     Neuro/Psych PSYCHIATRIC DISORDERS Depression Bipolar Disorder  Neuromuscular disease negative neurological ROS     GI/Hepatic negative GI ROS, Neg liver ROS, GERD  Medicated,  Endo/Other  diabetes, Type 2, Oral Hypoglycemic Agents  Renal/GU negative Renal ROS  negative genitourinary   Musculoskeletal   Abdominal (+) + obese,   Peds  Hematology negative hematology ROS (+)   Anesthesia Other Findings Past Medical History: No date: Depression No date: Diabetes mellitus without complication ([place]) 3/84/66: Diabetic peripheral neuropathy ([place]) 03/07/15: Diabetic peripheral neuropathy ([place]) 03/07/15: Diabetic peripheral neuropathy ([facility]) No date: GERD (gastroesophageal reflux disease) 03/07/15: Hypercholesterolemia No date: Hypertension 03/07/15: Obesity 03/07/15: Personality disorder 03/07/15: Sinus tachycardia ([place])     Comment: history of 03/07/15: Suicidal thoughts Past Surgical History: 03/07/15: electroconvulsion therapy BMI    Body Mass Index:  36.44 kg/m     Reproductive/Obstetrics                             Anesthesia Physical  Anesthesia Plan  ASA: III  Anesthesia Plan: General   Post-op Pain Management:    Induction: Intravenous  Airway Management  Planned: Mask  Additional Equipment:   Intra-op Plan:   Post-operative Plan:   Informed Consent: I have reviewed the patients History and Physical, chart, labs and discussed the procedure including the risks, benefits and alternatives for the proposed anesthesia with the patient or authorized representative who has indicated his/her understanding and acceptance.   Dental Advisory Given  Plan Discussed with: CRNA and Anesthesiologist  Anesthesia Plan Comments:         Anesthesia Quick Evaluation

## 2017-01-24 NOTE — Anesthesia Procedure Notes (Signed)
Performed by: Labib Cwynar Pre-anesthesia Checklist: Patient identified, Emergency Drugs available, Suction available and Patient being monitored Patient Re-evaluated:Patient Re-evaluated prior to inductionOxygen Delivery Method: Circle system utilized Preoxygenation: Pre-oxygenation with 100% oxygen Intubation Type: IV induction Ventilation: Mask ventilation without difficulty and Mask ventilation throughout procedure Airway Equipment and Method: Bite block Placement Confirmation: positive ETCO2 Dental Injury: Teeth and Oropharynx as per pre-operative assessment        

## 2017-01-24 NOTE — Anesthesia Post-op Follow-up Note (Cosign Needed)
Anesthesia QCDR form completed.        

## 2017-01-24 NOTE — H&P (Signed)
Melinda [name] is an 46 y.o. female.   Chief Complaint: Patient has no new chief complaint. Chronically anxious. A little more home stress right now. HPI: Chronic recurrent depression doing well with maintenance ECT on weekly basis  Past Medical History:  Diagnosis Date  . Depression   . Diabetes mellitus without complication ([place])   . Diabetic peripheral neuropathy ([place]) 03/07/15  . Diabetic peripheral neuropathy ([place]) 03/07/15  . Diabetic peripheral neuropathy ([place]) 03/07/15  . GERD (gastroesophageal reflux disease)   . Hypercholesterolemia 03/07/15  . Hypertension   . Obesity 03/07/15  . Personality disorder 03/07/15  . Sinus tachycardia 03/07/15   history of  . Suicidal thoughts 03/07/15    Past Surgical History:  Procedure Laterality Date  . electroconvulsion therapy  03/07/15    Family History  Problem Relation Age of Onset  . Hypertension Father   . Diabetes Mother    Social History:  reports that she has never smoked. She has never used smokeless tobacco. She reports that she does not drink alcohol or use drugs.  Allergies:  Allergies  Allergen Reactions  . Prednisone     Increases blood sugar     (Not in a hospital admission)  Results for orders placed or performed during the hospital encounter of 01/24/17 (from the past 48 hour(s))  Glucose, capillary     Status: None   Collection Time: 01/24/17  8:18 AM  Result Value Ref Range   Glucose-Capillary 93 65 - 99 mg/dL   No results found.  Review of Systems  Constitutional: Negative.   HENT: Negative.   Eyes: Negative.   Respiratory: Negative.   Cardiovascular: Negative.   Gastrointestinal: Negative.   Musculoskeletal: Negative.   Skin: Negative.   Neurological: Negative.   Psychiatric/Behavioral: Positive for depression. Negative for hallucinations, memory loss, substance abuse and suicidal ideas. The patient is nervous/anxious. The patient does not have insomnia.     Blood pressure (!) 161/95, pulse 99,  temperature 98 F (36.7 C), temperature source Oral, resp. rate 18, height 5\' 5"  (1.651 m), weight 104.8 kg (231 lb), SpO2 100 %. Physical Exam  Nursing note and vitals reviewed. Constitutional: She appears well-developed and well-nourished.  HENT:  Head: Normocephalic and atraumatic.  Eyes: Conjunctivae are normal. Pupils are equal, round, and reactive to light.  Neck: Normal range of motion.  Cardiovascular: Regular rhythm and normal heart sounds.   Respiratory: Effort normal.  GI: Soft.  Musculoskeletal: Normal range of motion.  Neurological: She is alert.  Skin: Skin is warm and dry.  Psychiatric: Judgment normal. Her affect is blunt. Her speech is delayed. She is slowed. Cognition and memory are normal. She expresses no suicidal ideation.     Assessment/Plan Follow-up one week.  [name], MD 01/24/2017, 10:08 AM

## 2017-01-24 NOTE — Transfer of Care (Signed)
Immediate Anesthesia Transfer of Care Note  Patient: Melinda [name]  Procedure(s) Performed: ECT  Patient Location: PACU  Anesthesia Type:General  Level of Consciousness: sedated  Airway & Oxygen Therapy: Patient Spontanous Breathing and Patient connected to face mask oxygen  Post-op Assessment: Report given to RN and Post -op Vital signs reviewed and stable  Post vital signs: Reviewed and stable  Last Vitals:  Vitals:   01/24/17 0832 01/24/17 1028  BP: (!) 161/95 136/87  Pulse: 99 (!) 101  Resp: 18 20  Temp: 36.7 C 37.2 C    Last Pain:  Vitals:   01/24/17 0832  TempSrc: Oral  PainSc: 0-No pain      Patients Stated Pain Goal: 0 (27/07/86 7544)  Complications: No apparent anesthesia complications

## 2017-01-24 NOTE — Procedures (Signed)
ECT SERVICES Physician's Interval Evaluation & Treatment Note  Patient Identification: Melinda [name] MRN:  [id number] Date of Evaluation:  01/24/2017 TX #: [id number]  MADRS:   MMSE:   P.E. Findings:  No change to physical exam. Vitals stable heart and lungs clear  Psychiatric Interval Note:  Mood is stable little bit more stressed out this week.  Subjective:  Patient is a 45 y.o. female seen for evaluation for Electroconvulsive Therapy. No specific new complaints except for some family stress  Treatment Summary:   []   Right Unilateral             [x]  Bilateral   % Energy : 1.0 ms 35%   Impedance: 720 ohms  Seizure Energy Index: Computer didn't make a reading  Postictal Suppression Index: Also didn't make a reading but it looked pretty good  Seizure Concordance Index: 80%  Medications  Pre Shock: Robinul 0.4 mg Toradol 30 mg labetalol 20 mg esmolol 10 mg Brevital 70 mg succinylcholine 100 mg  Post Shock:    Seizure Duration: 35 seconds by EMG 56 seconds by EEG   Comments: Follow-up one week   Lungs:  [x]   Clear to auscultation               []  Other:   Heart:    [x]   Regular rhythm             []  irregular rhythm    [x]   Previous H&P reviewed, patient examined and there are NO CHANGES                 []   Previous H&P reviewed, patient examined and there are changes noted.   [name], MD 3/16/201810:12 AM

## 2017-01-26 NOTE — Anesthesia Postprocedure Evaluation (Signed)
Anesthesia Post Note  Patient: Melinda [name]  Procedure(s) Performed: * No procedures listed *  Patient location during evaluation: PACU Anesthesia Type: General Level of consciousness: awake and alert Pain management: pain level controlled Vital Signs Assessment: post-procedure vital signs reviewed and stable Respiratory status: spontaneous breathing, nonlabored ventilation, respiratory function stable and patient connected to nasal cannula oxygen Cardiovascular status: blood pressure returned to baseline and stable Postop Assessment: no signs of nausea or vomiting Anesthetic complications: no     Last Vitals:  Vitals:   01/24/17 1058 01/24/17 1110  BP: (!) 124/94 (!) 143/67  Pulse: 91 83  Resp: 16 18  Temp:      Last Pain:  Vitals:   01/24/17 1058  TempSrc:   PainSc: 0-No pain                 Precious Haws Jairo Bellew

## 2017-01-31 ENCOUNTER — Other Ambulatory Visit: Payer: Self-pay | Admitting: Psychiatry

## 2017-01-31 ENCOUNTER — Encounter: Payer: Self-pay | Admitting: Anesthesiology

## 2017-01-31 ENCOUNTER — Ambulatory Visit
Admission: RE | Admit: 2017-01-31 | Discharge: 2017-01-31 | Disposition: A | Discharge status: Home / Self Care | Payer: Medicare PPO | Source: Ambulatory Visit | Attending: Psychiatry | Admitting: Psychiatry

## 2017-01-31 DIAGNOSIS — I10 Essential (primary) hypertension: Secondary | ICD-10-CM | POA: Insufficient documentation

## 2017-01-31 DIAGNOSIS — E1142 Type 2 diabetes mellitus with diabetic polyneuropathy: Secondary | ICD-10-CM | POA: Insufficient documentation

## 2017-01-31 DIAGNOSIS — F332 Major depressive disorder, recurrent severe without psychotic features: Secondary | ICD-10-CM

## 2017-01-31 DIAGNOSIS — E669 Obesity, unspecified: Secondary | ICD-10-CM | POA: Insufficient documentation

## 2017-01-31 DIAGNOSIS — F339 Major depressive disorder, recurrent, unspecified: Secondary | ICD-10-CM | POA: Insufficient documentation

## 2017-01-31 DIAGNOSIS — K219 Gastro-esophageal reflux disease without esophagitis: Secondary | ICD-10-CM | POA: Insufficient documentation

## 2017-01-31 DIAGNOSIS — E7800 Pure hypercholesterolemia, unspecified: Secondary | ICD-10-CM | POA: Diagnosis not present

## 2017-01-31 DIAGNOSIS — E119 Type 2 diabetes mellitus without complications: Secondary | ICD-10-CM | POA: Diagnosis not present

## 2017-01-31 LAB — GLUCOSE, CAPILLARY: Glucose-Capillary: 115 mg/dL — ABNORMAL HIGH (ref 65–99)

## 2017-01-31 MED ORDER — LABETALOL HCL 5 MG/ML IV SOLN
INTRAVENOUS | Status: DC | PRN
  Administered 2017-01-31: 20 mg via INTRAVENOUS

## 2017-01-31 MED ORDER — ESMOLOL HCL 100 MG/10ML IV SOLN
INTRAVENOUS | Status: DC | PRN
  Administered 2017-01-31: 20 mg via INTRAVENOUS

## 2017-01-31 MED ORDER — ESMOLOL HCL 100 MG/10ML IV SOLN
INTRAVENOUS | Status: AC
  Filled 2017-01-31: qty 10

## 2017-01-31 MED ORDER — FENTANYL CITRATE (PF) 100 MCG/2ML IJ SOLN: 25.0000 ug | INTRAMUSCULAR | Status: DC | PRN

## 2017-01-31 MED ORDER — SODIUM CHLORIDE 0.9 % IV SOLN
500.0000 mL | Freq: Once | INTRAVENOUS | Status: AC
  Administered 2017-01-31: 1000 mL via INTRAVENOUS

## 2017-01-31 MED ORDER — SUCCINYLCHOLINE CHLORIDE 200 MG/10ML IV SOSY
PREFILLED_SYRINGE | INTRAVENOUS | Status: DC | PRN
  Administered 2017-01-31: 100 mg via INTRAVENOUS

## 2017-01-31 MED ORDER — METHOHEXITAL SODIUM 0.5 G IJ SOLR
INTRAMUSCULAR | Status: AC
Start: 2017-01-31 — End: 2017-01-31
  Filled 2017-01-31: qty 500

## 2017-01-31 MED ORDER — GLYCOPYRROLATE 0.2 MG/ML IJ SOLN
0.4000 mg | Freq: Once | INTRAMUSCULAR | Status: AC
  Administered 2017-01-31: 0.4 mg via INTRAVENOUS

## 2017-01-31 MED ORDER — SUCCINYLCHOLINE CHLORIDE 20 MG/ML IJ SOLN
INTRAMUSCULAR | Status: AC
  Filled 2017-01-31: qty 1

## 2017-01-31 MED ORDER — METHOHEXITAL SODIUM 100 MG/10ML IV SOSY
PREFILLED_SYRINGE | INTRAVENOUS | Status: DC | PRN
  Administered 2017-01-31: 70 mg via INTRAVENOUS

## 2017-01-31 MED ORDER — GLYCOPYRROLATE 0.2 MG/ML IJ SOLN
INTRAMUSCULAR | Status: AC
  Administered 2017-01-31: 0.4 mg via INTRAVENOUS
  Filled 2017-01-31: qty 2

## 2017-01-31 MED ORDER — KETOROLAC TROMETHAMINE 30 MG/ML IJ SOLN
INTRAMUSCULAR | Status: AC
  Administered 2017-01-31: 30 mg via INTRAVENOUS
  Filled 2017-01-31: qty 1

## 2017-01-31 MED ORDER — ONDANSETRON HCL 4 MG/2ML IJ SOLN: 4.0000 mg | Freq: Once | INTRAMUSCULAR | Status: DC | PRN

## 2017-01-31 MED ORDER — KETOROLAC TROMETHAMINE 30 MG/ML IJ SOLN
30.0000 mg | Freq: Once | INTRAMUSCULAR | Status: AC
  Administered 2017-01-31: 30 mg via INTRAVENOUS

## 2017-01-31 MED ORDER — SODIUM CHLORIDE 0.9 % IV SOLN
INTRAVENOUS | Status: DC | PRN
  Administered 2017-01-31: 10:00:00 via INTRAVENOUS

## 2017-01-31 MED ORDER — LABETALOL HCL 5 MG/ML IV SOLN
INTRAVENOUS | Status: AC
  Filled 2017-01-31: qty 4

## 2017-01-31 NOTE — Anesthesia Postprocedure Evaluation (Signed)
Anesthesia Post Note  Patient: Melinda [name]  Procedure(s) Performed: * No procedures listed *  Patient location during evaluation: PACU Anesthesia Type: General Level of consciousness: awake and alert Pain management: pain level controlled Vital Signs Assessment: post-procedure vital signs reviewed and stable Respiratory status: spontaneous breathing, nonlabored ventilation, respiratory function stable and patient connected to nasal cannula oxygen Cardiovascular status: blood pressure returned to baseline and stable Postop Assessment: no signs of nausea or vomiting Anesthetic complications: no     Last Vitals:  Vitals:   01/31/17 1023 01/31/17 1037  BP: (!) 143/72 125/70  Pulse: 100 90  Resp: 14 20  Temp: 37.3 C     Last Pain:  Vitals:   01/31/17 1037  TempSrc:   PainSc: 0-No pain                 Molli Barrows

## 2017-01-31 NOTE — Anesthesia Post-op Follow-up Note (Cosign Needed)
Anesthesia QCDR form completed.        

## 2017-01-31 NOTE — Discharge Instructions (Signed)
1)  The drugs that you have been given will stay in your system until tomorrow so for the       next 24 hours you should not:  A. Drive an automobile  B. Make any legal decisions  C. Drink any alcoholic beverages  2)  You may resume your regular meals upon return home.  3)  A responsible adult must take you home.  Someone should stay with you for a few          hours, then be available by phone for the remainder of the treatment day.  4)  You May experience any of the following symptoms:  Headache, Nausea and a dry mouth (due to the medications you were given),  temporary memory loss and some confusion, or sore muscles (a warm bath  should help this).  If you you experience any of these symptoms let us know on                your return visit.  5)  Report any of the following: any acute discomfort, severe headache, or temperature        greater than 100.5 F.   Also report any unusual redness, swelling, drainage, or pain         at your IV site.    You may report Symptoms to:  [place] [facility]          Phone: [phone], ECT Department           or Dr. Prescott Gum office [phone]  6)  Your next ECT Treatment is Day Friday  Date February 14, 2017  We will call 2 days prior to your scheduled appointment for arrival times.  7)  Nothing to eat or drink after midnight the night before your procedure.  8)  Take .    With a sip of water the morning of your procedure.  9)  Other Instructions: Call [phone] to cancel the morning of your procedure due         to illness or emergency.  10) We will call within 72 hours to assess how you are feeling.

## 2017-01-31 NOTE — Procedures (Signed)
ECT SERVICES Physician's Interval Evaluation & Treatment Note  Patient Identification: Melinda [name] MRN:  [id number] Date of Evaluation:  01/31/2017 TX #: [id number]  MADRS: 17  MMSE: 29  P.E. Findings:  No change to physical exam. Heart and lungs normal. Vitals unremarkable  Psychiatric Interval Note:  Mood stable no change  Subjective:  Patient is a 46 y.o. female seen for evaluation for Electroconvulsive Therapy. No specific new complaint  Treatment Summary:   []   Right Unilateral             [x]  Bilateral   % Energy : 1.0 ms 35%   Impedance: 1540 ohms  Seizure Energy Index:    Postictal Suppression Index:    Seizure Concordance Index:    Medications  Pre Shock: Robinul 0.4 mg Toradol 30 mg labetalol 20 mg esmolol 10 mg Brevital 70 mg succinylcholine 100 mg  Post Shock:    Seizure Duration: 38 seconds by EMG 62 seconds by EEG   Comments: Follow-up 2 weeks   Lungs:  [x]   Clear to auscultation               []  Other:   Heart:    [x]   Regular rhythm             []  irregular rhythm    [x]   Previous H&P reviewed, patient examined and there are NO CHANGES                 []   Previous H&P reviewed, patient examined and there are changes noted.   [name], MD 3/23/201810:07 AM

## 2017-01-31 NOTE — H&P (Signed)
Melinda [name] is an 46 y.o. female.   Chief Complaint: No specific complaint HPI: Ongoing recurrent major depression with good control under maintenance ECT  Past Medical History:  Diagnosis Date  . Depression   . Diabetes mellitus without complication ([place])   . Diabetic peripheral neuropathy ([place]) 03/07/15  . Diabetic peripheral neuropathy ([place]) 03/07/15  . Diabetic peripheral neuropathy ([place]) 03/07/15  . GERD (gastroesophageal reflux disease)   . Hypercholesterolemia 03/07/15  . Hypertension   . Obesity 03/07/15  . Personality disorder 03/07/15  . Sinus tachycardia 03/07/15   history of  . Suicidal thoughts 03/07/15    Past Surgical History:  Procedure Laterality Date  . electroconvulsion therapy  03/07/15    Family History  Problem Relation Age of Onset  . Hypertension Father   . Diabetes Mother    Social History:  reports that she has never smoked. She has never used smokeless tobacco. She reports that she does not drink alcohol or use drugs.  Allergies:  Allergies  Allergen Reactions  . Prednisone     Increases blood sugar     (Not in a hospital admission)  Results for orders placed or performed during the hospital encounter of 01/31/17 (from the past 48 hour(s))  Glucose, capillary     Status: Abnormal   Collection Time: 01/31/17  8:29 AM  Result Value Ref Range   Glucose-Capillary 115 (H) 65 - 99 mg/dL   No results found.  Review of Systems  Constitutional: Negative.   HENT: Negative.   Eyes: Negative.   Respiratory: Negative.   Cardiovascular: Negative.   Gastrointestinal: Negative.   Musculoskeletal: Negative.   Skin: Negative.   Neurological: Negative.   Psychiatric/Behavioral: Negative.     Blood pressure (!) 140/91, pulse 92, temperature 99.1 F (37.3 C), temperature source Oral, resp. rate 18, height 5\' 5"  (1.651 m), weight 103.9 kg (229 lb), SpO2 100 %. Physical Exam  Nursing note and vitals reviewed. Constitutional: She appears  well-developed and well-nourished.  HENT:  Head: Normocephalic and atraumatic.  Eyes: Conjunctivae are normal. Pupils are equal, round, and reactive to light.  Neck: Normal range of motion.  Cardiovascular: Regular rhythm and normal heart sounds.   Respiratory: Effort normal and breath sounds normal. No respiratory distress.  GI: Soft.  Musculoskeletal: Normal range of motion.  Neurological: She is alert.  Skin: Skin is warm and dry.  Psychiatric: She has a normal mood and affect. Her behavior is normal. Judgment and thought content normal.     Assessment/Plan Treatment today follow-up in 2 weeks due to vacation schedule.  [name], MD 01/31/2017, 10:05 AM

## 2017-01-31 NOTE — Anesthesia Procedure Notes (Signed)
Date/Time: 01/31/2017 10:14 AM Performed by: Dionne Bucy Pre-anesthesia Checklist: Patient identified, Emergency Drugs available, Suction available and Patient being monitored Patient Re-evaluated:Patient Re-evaluated prior to inductionOxygen Delivery Method: Circle system utilized Preoxygenation: Pre-oxygenation with 100% oxygen Intubation Type: IV induction Ventilation: Mask ventilation without difficulty and Mask ventilation throughout procedure Airway Equipment and Method: Bite block Placement Confirmation: positive ETCO2 Dental Injury: Teeth and Oropharynx as per pre-operative assessment

## 2017-01-31 NOTE — Transfer of Care (Signed)
Immediate Anesthesia Transfer of Care Note  Patient: Melinda [name]  Procedure(s) Performed: ECT  Patient Location: PACU  Anesthesia Type:General  Level of Consciousness: sedated  Airway & Oxygen Therapy: Patient Spontanous Breathing  Post-op Assessment: Report given to RN and Post -op Vital signs reviewed and stable  Post vital signs: Reviewed and stable  Last Vitals:  Vitals:   01/31/17 0831 01/31/17 1023  BP: (!) 140/91 (!) 143/72  Pulse:  100  Resp: 18 14  Temp: 37.3 C 37.3 C    Last Pain:  Vitals:   01/31/17 1023  TempSrc:   PainSc: 0-No pain         Complications: No apparent anesthesia complications

## 2017-01-31 NOTE — Anesthesia Preprocedure Evaluation (Signed)
Anesthesia Evaluation  Patient identified by MRN, date of birth, ID band Patient awake    Reviewed: Allergy & Precautions, H&P , NPO status , Patient's Chart, lab work & pertinent test results, reviewed documented beta blocker date and time   Airway Mallampati: II   Neck ROM: full    Dental  (+) Poor Dentition   Pulmonary neg pulmonary ROS, sleep apnea ,    Pulmonary exam normal        Cardiovascular hypertension, negative cardio ROS Normal cardiovascular exam Rhythm:regular Rate:Normal     Neuro/Psych PSYCHIATRIC DISORDERS  Neuromuscular disease negative neurological ROS  negative psych ROS   GI/Hepatic negative GI ROS, Neg liver ROS, GERD  Medicated,  Endo/Other  negative endocrine ROSdiabetesMorbid obesity  Renal/GU negative Renal ROS  negative genitourinary   Musculoskeletal   Abdominal   Peds  Hematology negative hematology ROS (+)   Anesthesia Other Findings Past Medical History: No date: Depression No date: Diabetes mellitus without complication ([place]) 5/36/14: Diabetic peripheral neuropathy ([place]) 03/07/15: Diabetic peripheral neuropathy ([place]) 03/07/15: Diabetic peripheral neuropathy ([facility]) No date: GERD (gastroesophageal reflux disease) 03/07/15: Hypercholesterolemia No date: Hypertension 03/07/15: Obesity 03/07/15: Personality disorder 03/07/15: Sinus tachycardia     Comment: history of 03/07/15: Suicidal thoughts Past Surgical History: 03/07/15: electroconvulsion therapy BMI    Body Mass Index:  38.11 kg/m     Reproductive/Obstetrics negative OB ROS                             Anesthesia Physical Anesthesia Plan  ASA: III  Anesthesia Plan: General   Post-op Pain Management:    Induction:   Airway Management Planned:   Additional Equipment:   Intra-op Plan:   Post-operative Plan:   Informed Consent: I have reviewed the patients History and Physical, chart, labs  and discussed the procedure including the risks, benefits and alternatives for the proposed anesthesia with the patient or authorized representative who has indicated his/her understanding and acceptance.   Dental Advisory Given  Plan Discussed with: CRNA  Anesthesia Plan Comments:         Anesthesia Quick Evaluation

## 2017-02-12 ENCOUNTER — Telehealth (HOSPITAL_COMMUNITY): Payer: Self-pay | Admitting: *Deleted

## 2017-02-12 ENCOUNTER — Telehealth: Payer: Self-pay | Admitting: *Deleted

## 2017-02-12 NOTE — Telephone Encounter (Signed)
Called for authorization of ECT. Spoke with Beverlee Nims who gave approval #[id number]. 12 session given starting 12/13/16 until 04/10/17.

## 2017-02-14 ENCOUNTER — Encounter
Admission: RE | Admit: 2017-02-14 | Discharge: 2017-02-14 | Disposition: A | Discharge status: Home / Self Care | Payer: Medicare PPO | Source: Ambulatory Visit | Attending: Psychiatry | Admitting: Psychiatry

## 2017-02-14 ENCOUNTER — Encounter: Payer: Self-pay | Admitting: Anesthesiology

## 2017-02-14 ENCOUNTER — Other Ambulatory Visit: Payer: Self-pay | Admitting: Psychiatry

## 2017-02-14 DIAGNOSIS — Z6839 Body mass index (BMI) 39.0-39.9, adult: Secondary | ICD-10-CM | POA: Diagnosis not present

## 2017-02-14 DIAGNOSIS — I10 Essential (primary) hypertension: Secondary | ICD-10-CM | POA: Diagnosis not present

## 2017-02-14 DIAGNOSIS — Z888 Allergy status to other drugs, medicaments and biological substances status: Secondary | ICD-10-CM | POA: Insufficient documentation

## 2017-02-14 DIAGNOSIS — K219 Gastro-esophageal reflux disease without esophagitis: Secondary | ICD-10-CM | POA: Insufficient documentation

## 2017-02-14 DIAGNOSIS — E669 Obesity, unspecified: Secondary | ICD-10-CM | POA: Insufficient documentation

## 2017-02-14 DIAGNOSIS — F332 Major depressive disorder, recurrent severe without psychotic features: Secondary | ICD-10-CM | POA: Diagnosis not present

## 2017-02-14 DIAGNOSIS — Z8249 Family history of ischemic heart disease and other diseases of the circulatory system: Secondary | ICD-10-CM | POA: Insufficient documentation

## 2017-02-14 DIAGNOSIS — F329 Major depressive disorder, single episode, unspecified: Secondary | ICD-10-CM | POA: Diagnosis not present

## 2017-02-14 DIAGNOSIS — E1142 Type 2 diabetes mellitus with diabetic polyneuropathy: Secondary | ICD-10-CM | POA: Diagnosis not present

## 2017-02-14 DIAGNOSIS — F339 Major depressive disorder, recurrent, unspecified: Secondary | ICD-10-CM | POA: Diagnosis not present

## 2017-02-14 DIAGNOSIS — E7800 Pure hypercholesterolemia, unspecified: Secondary | ICD-10-CM | POA: Insufficient documentation

## 2017-02-14 DIAGNOSIS — R69 Illness, unspecified: Secondary | ICD-10-CM | POA: Diagnosis not present

## 2017-02-14 DIAGNOSIS — Z833 Family history of diabetes mellitus: Secondary | ICD-10-CM | POA: Diagnosis not present

## 2017-02-14 LAB — GLUCOSE, CAPILLARY: Glucose-Capillary: 96 mg/dL (ref 65–99)

## 2017-02-14 MED ORDER — GLYCOPYRROLATE 0.2 MG/ML IJ SOLN
0.4000 mg | Freq: Once | INTRAMUSCULAR | Status: AC
  Administered 2017-02-14: 0.4 mg via INTRAVENOUS

## 2017-02-14 MED ORDER — KETOROLAC TROMETHAMINE 30 MG/ML IJ SOLN
INTRAMUSCULAR | Status: AC
  Administered 2017-02-14: 30 mg via INTRAVENOUS
  Filled 2017-02-14: qty 1

## 2017-02-14 MED ORDER — GLYCOPYRROLATE 0.2 MG/ML IJ SOLN
INTRAMUSCULAR | Status: AC
  Filled 2017-02-14: qty 2

## 2017-02-14 MED ORDER — ESMOLOL HCL 100 MG/10ML IV SOLN
INTRAVENOUS | Status: AC
  Filled 2017-02-14: qty 10

## 2017-02-14 MED ORDER — SUCCINYLCHOLINE CHLORIDE 20 MG/ML IJ SOLN
INTRAMUSCULAR | Status: DC | PRN
  Administered 2017-02-14: 100 mg via INTRAVENOUS

## 2017-02-14 MED ORDER — METHOHEXITAL SODIUM 100 MG/10ML IV SOSY
PREFILLED_SYRINGE | INTRAVENOUS | Status: DC | PRN
  Administered 2017-02-14: 70 mg via INTRAVENOUS

## 2017-02-14 MED ORDER — SUCCINYLCHOLINE CHLORIDE 20 MG/ML IJ SOLN
INTRAMUSCULAR | Status: AC
  Filled 2017-02-14: qty 1

## 2017-02-14 MED ORDER — LABETALOL HCL 5 MG/ML IV SOLN
INTRAVENOUS | Status: AC
  Filled 2017-02-14: qty 4

## 2017-02-14 MED ORDER — LABETALOL HCL 5 MG/ML IV SOLN
INTRAVENOUS | Status: DC | PRN
  Administered 2017-02-14: 20 mg via INTRAVENOUS

## 2017-02-14 MED ORDER — FENTANYL CITRATE (PF) 100 MCG/2ML IJ SOLN: 25.0000 ug | INTRAMUSCULAR | Status: DC | PRN

## 2017-02-14 MED ORDER — ONDANSETRON HCL 4 MG/2ML IJ SOLN: 4.0000 mg | Freq: Once | INTRAMUSCULAR | Status: DC | PRN

## 2017-02-14 MED ORDER — KETOROLAC TROMETHAMINE 30 MG/ML IJ SOLN
30.0000 mg | Freq: Once | INTRAMUSCULAR | Status: AC
  Administered 2017-02-14: 30 mg via INTRAVENOUS

## 2017-02-14 MED ORDER — SODIUM CHLORIDE 0.9 % IV SOLN
500.0000 mL | Freq: Once | INTRAVENOUS | Status: AC
  Administered 2017-02-14: 1000 mL via INTRAVENOUS

## 2017-02-14 MED ORDER — ESMOLOL HCL 100 MG/10ML IV SOLN
INTRAVENOUS | Status: DC | PRN
  Administered 2017-02-14: 20 mg via INTRAVENOUS

## 2017-02-14 MED ORDER — METHOHEXITAL SODIUM 0.5 G IJ SOLR
INTRAMUSCULAR | Status: AC
  Filled 2017-02-14: qty 500

## 2017-02-14 NOTE — Anesthesia Preprocedure Evaluation (Addendum)
Anesthesia Evaluation  Patient identified by MRN, date of birth, ID band Patient awake    Reviewed: Allergy & Precautions, H&P , NPO status , Patient's Chart, lab work & pertinent test results, reviewed documented beta blocker date and time   Airway Mallampati: II   Neck ROM: full    Dental  (+) Poor Dentition   Pulmonary neg pulmonary ROS, sleep apnea ,    Pulmonary exam normal        Cardiovascular hypertension, Pt. on medications negative cardio ROS Normal cardiovascular exam Rhythm:regular Rate:Normal     Neuro/Psych PSYCHIATRIC DISORDERS Depression Bipolar Disorder  Neuromuscular disease negative neurological ROS  negative psych ROS   GI/Hepatic negative GI ROS, Neg liver ROS, GERD  Medicated,  Endo/Other  negative endocrine ROSdiabetesMorbid obesity  Renal/GU negative Renal ROS  negative genitourinary   Musculoskeletal   Abdominal   Peds  Hematology negative hematology ROS (+)   Anesthesia Other Findings Past Medical History: No date: Depression No date: Diabetes mellitus without complication ([place]) 1/50/56: Diabetic peripheral neuropathy ([place]) 03/07/15: Diabetic peripheral neuropathy ([place]) 03/07/15: Diabetic peripheral neuropathy ([facility]) No date: GERD (gastroesophageal reflux disease) 03/07/15: Hypercholesterolemia No date: Hypertension 03/07/15: Obesity 03/07/15: Personality disorder 03/07/15: Sinus tachycardia     Comment: history of 03/07/15: Suicidal thoughts Past Surgical History: 03/07/15: electroconvulsion therapy BMI    Body Mass Index:  38.11 kg/m     Reproductive/Obstetrics negative OB ROS                             Anesthesia Physical  Anesthesia Plan  ASA: III  Anesthesia Plan: General   Post-op Pain Management:    Induction: Intravenous  Airway Management Planned: Mask  Additional Equipment:   Intra-op Plan:   Post-operative Plan:   Informed Consent:  I have reviewed the patients History and Physical, chart, labs and discussed the procedure including the risks, benefits and alternatives for the proposed anesthesia with the patient or authorized representative who has indicated his/her understanding and acceptance.   Dental Advisory Given  Plan Discussed with: CRNA  Anesthesia Plan Comments:        Anesthesia Quick Evaluation

## 2017-02-14 NOTE — Procedures (Signed)
ECT SERVICES Physician's Interval Evaluation & Treatment Note  Patient Identification: Melinda [name] MRN:  [id number] Date of Evaluation:  02/14/2017 TX #: [id number]  MADRS:   MMSE:   P.E. Findings:  No change to physical exam. Stable. Heart and lungs normal.  Psychiatric Interval Note:  Feeling sad and reports that she's cut herself very slightly a couple times in the last week no suicidal intent.  Subjective:  Patient is a 46 y.o. female seen for evaluation for Electroconvulsive Therapy. Feeling a little bit more down and nervous  Treatment Summary:   []   Right Unilateral             [x]  Bilateral   % Energy : 1.0 ms 35%   Impedance: 910 ohms  Seizure Energy Index: No reading  Postictal Suppression Index: No reading but looked fine  Seizure Concordance Index: 91%  Medications  Pre Shock: Robinul 0.4 mg Toradol 30 mg labetalol 20 mg esmolol 10 mg Brevital 70 mg succinylcholine 100 mg  Post Shock:    Seizure Duration: 41 seconds by EMG 75 seconds by EEG   Comments: Follow-up one week   Lungs:  [x]   Clear to auscultation               []  Other:   Heart:    [x]   Regular rhythm             []  irregular rhythm    [x]   Previous H&P reviewed, patient examined and there are NO CHANGES                 []   Previous H&P reviewed, patient examined and there are changes noted.   [name], MD 4/6/201810:25 AM

## 2017-02-14 NOTE — Anesthesia Postprocedure Evaluation (Signed)
Anesthesia Post Note  Patient: Melinda [name]  Procedure(s) Performed: * No procedures listed *  Patient location during evaluation: PACU Anesthesia Type: General Level of consciousness: awake and alert and oriented Pain management: pain level controlled Vital Signs Assessment: post-procedure vital signs reviewed and stable Respiratory status: spontaneous breathing Cardiovascular status: blood pressure returned to baseline Anesthetic complications: no     Last Vitals:  Vitals:   02/14/17 1109 02/14/17 1110  BP: 125/78 (!) 154/86  Pulse: 100 95  Resp: 16 18  Temp:      Last Pain:  Vitals:   02/14/17 1110  TempSrc: Oral  PainSc:                  Haig Gerardo

## 2017-02-14 NOTE — H&P (Signed)
Melinda [name] is an 46 y.o. female.   Chief Complaint: Patient with chronic depression. A little more down and has done a couple cutting in the last couple days. Not suicidal. HPI: History of severe recurrent depression  Past Medical History:  Diagnosis Date  . Depression   . Diabetes mellitus without complication ([place])   . Diabetic peripheral neuropathy ([place]) 03/07/15  . Diabetic peripheral neuropathy ([place]) 03/07/15  . Diabetic peripheral neuropathy ([place]) 03/07/15  . GERD (gastroesophageal reflux disease)   . Hypercholesterolemia 03/07/15  . Hypertension   . Obesity 03/07/15  . Personality disorder 03/07/15  . Sinus tachycardia 03/07/15   history of  . Suicidal thoughts 03/07/15    Past Surgical History:  Procedure Laterality Date  . electroconvulsion therapy  03/07/15    Family History  Problem Relation Age of Onset  . Hypertension Father   . Diabetes Mother    Social History:  reports that she has never smoked. She has never used smokeless tobacco. She reports that she does not drink alcohol or use drugs.  Allergies:  Allergies  Allergen Reactions  . Prednisone     Increases blood sugar     (Not in a hospital admission)  Results for orders placed or performed during the hospital encounter of 02/14/17 (from the past 48 hour(s))  Glucose, capillary     Status: None   Collection Time: 02/14/17  8:26 AM  Result Value Ref Range   Glucose-Capillary 96 65 - 99 mg/dL   No results found.  Review of Systems  Constitutional: Negative.   HENT: Negative.   Eyes: Negative.   Respiratory: Negative.   Cardiovascular: Negative.   Gastrointestinal: Negative.   Musculoskeletal: Negative.   Skin: Negative.   Neurological: Negative.   Psychiatric/Behavioral: Positive for depression. Negative for hallucinations, memory loss, substance abuse and suicidal ideas. The patient is nervous/anxious and has insomnia.     Blood pressure 138/84, pulse 100, temperature 97.8 F (36.6 C),  temperature source Oral, resp. rate 16, height 5\' 5"  (1.651 m), weight 108 kg (238 lb), SpO2 100 %. Physical Exam  Nursing note and vitals reviewed. Constitutional: She appears well-developed and well-nourished.  HENT:  Head: Normocephalic and atraumatic.  Eyes: Conjunctivae are normal. Pupils are equal, round, and reactive to light.  Neck: Normal range of motion.  Cardiovascular: Normal heart sounds.   Respiratory: Effort normal.  GI: Soft.  Musculoskeletal: Normal range of motion.  Neurological: She is alert.  Skin: Skin is warm and dry.     Psychiatric: Her speech is delayed. She is slowed. Cognition and memory are normal. She expresses impulsivity. She exhibits a depressed mood. She expresses no homicidal and no suicidal ideation.     Assessment/Plan Usually stable with maintenance ECT wouldn't have treatment today in follow-up in 1 week  [name], MD 02/14/2017, 10:24 AM

## 2017-02-14 NOTE — Discharge Instructions (Signed)
1)  The drugs that you have been given will stay in your system until tomorrow so for the       next 24 hours you should not:  A. Drive an automobile  B. Make any legal decisions  C. Drink any alcoholic beverages  2)  You may resume your regular meals upon return home.  3)  A responsible adult must take you home.  Someone should stay with you for a few          hours, then be available by phone for the remainder of the treatment day.  4)  You May experience any of the following symptoms:  Headache, Nausea and a dry mouth (due to the medications you were given),  temporary memory loss and some confusion, or sore muscles (a warm bath  should help this).  If you you experience any of these symptoms let us know on                your return visit.  5)  Report any of the following: any acute discomfort, severe headache, or temperature        greater than 100.5 F.   Also report any unusual redness, swelling, drainage, or pain         at your IV site.    You may report Symptoms to:  [place] at [facility]          Phone: [phone], ECT Department           or Dr. Prescott Gum office [phone]  6)  Your next ECT Treatment is Day Friday  Date February 21, 2017  We will call 2 days prior to your scheduled appointment for arrival times.  7)  Nothing to eat or drink after midnight the night before your procedure.  8)  Take .    With a sip of water the morning of your procedure.  9)  Other Instructions: Call [phone] to cancel the morning of your procedure due         to illness or emergency.  10) We will call within 72 hours to assess how you are feeling.

## 2017-02-14 NOTE — Anesthesia Post-op Follow-up Note (Cosign Needed)
Anesthesia QCDR form completed.        

## 2017-02-14 NOTE — Anesthesia Procedure Notes (Signed)
Date/Time: 02/14/2017 10:30 AM Performed by: Nelda Marseille Pre-anesthesia Checklist: Patient identified, Emergency Drugs available, Suction available, Patient being monitored and Timeout performed Oxygen Delivery Method: Ambu bag

## 2017-02-14 NOTE — Transfer of Care (Signed)
Immediate Anesthesia Transfer of Care Note  Patient: Melinda [name]  Procedure(s) Performed: * No procedures listed *  Patient Location: PACU  Anesthesia Type:General  Level of Consciousness: awake and sedated  Airway & Oxygen Therapy: Patient Spontanous Breathing  Post-op Assessment: Report given to RN and Post -op Vital signs reviewed and stable  Post vital signs: Reviewed and stable  Last Vitals:  Vitals:   02/14/17 0831  BP: 138/84  Pulse: 100  Resp: 16  Temp: 36.6 C    Last Pain:  Vitals:   02/14/17 0831  TempSrc: Oral  PainSc: 0-No pain         Complications: No apparent anesthesia complications

## 2017-02-20 ENCOUNTER — Other Ambulatory Visit: Payer: Self-pay | Admitting: Psychiatry

## 2017-02-21 ENCOUNTER — Encounter: Payer: Self-pay | Admitting: Anesthesiology

## 2017-02-21 ENCOUNTER — Ambulatory Visit
Admission: RE | Admit: 2017-02-21 | Discharge: 2017-02-21 | Disposition: A | Discharge status: Home / Self Care | Payer: Medicare PPO | Source: Ambulatory Visit | Attending: Psychiatry | Admitting: Psychiatry

## 2017-02-21 DIAGNOSIS — E669 Obesity, unspecified: Secondary | ICD-10-CM | POA: Insufficient documentation

## 2017-02-21 DIAGNOSIS — F329 Major depressive disorder, single episode, unspecified: Secondary | ICD-10-CM | POA: Diagnosis not present

## 2017-02-21 DIAGNOSIS — F3189 Other bipolar disorder: Secondary | ICD-10-CM | POA: Diagnosis not present

## 2017-02-21 DIAGNOSIS — E1142 Type 2 diabetes mellitus with diabetic polyneuropathy: Secondary | ICD-10-CM | POA: Diagnosis not present

## 2017-02-21 DIAGNOSIS — F332 Major depressive disorder, recurrent severe without psychotic features: Secondary | ICD-10-CM

## 2017-02-21 DIAGNOSIS — F339 Major depressive disorder, recurrent, unspecified: Secondary | ICD-10-CM | POA: Diagnosis not present

## 2017-02-21 DIAGNOSIS — E119 Type 2 diabetes mellitus without complications: Secondary | ICD-10-CM | POA: Diagnosis not present

## 2017-02-21 DIAGNOSIS — I10 Essential (primary) hypertension: Secondary | ICD-10-CM | POA: Insufficient documentation

## 2017-02-21 DIAGNOSIS — E7800 Pure hypercholesterolemia, unspecified: Secondary | ICD-10-CM | POA: Diagnosis not present

## 2017-02-21 DIAGNOSIS — K219 Gastro-esophageal reflux disease without esophagitis: Secondary | ICD-10-CM | POA: Insufficient documentation

## 2017-02-21 LAB — GLUCOSE, CAPILLARY: Glucose-Capillary: 98 mg/dL (ref 65–99)

## 2017-02-21 MED ORDER — KETOROLAC TROMETHAMINE 30 MG/ML IJ SOLN
INTRAMUSCULAR | Status: AC
  Filled 2017-02-21: qty 1

## 2017-02-21 MED ORDER — ESMOLOL HCL 100 MG/10ML IV SOLN
INTRAVENOUS | Status: DC | PRN
  Administered 2017-02-21: 20 mg via INTRAVENOUS

## 2017-02-21 MED ORDER — SUCCINYLCHOLINE CHLORIDE 200 MG/10ML IV SOSY
PREFILLED_SYRINGE | INTRAVENOUS | Status: DC | PRN
  Administered 2017-02-21: 100 mg via INTRAVENOUS

## 2017-02-21 MED ORDER — SODIUM CHLORIDE 0.9 % IV SOLN
500.0000 mL | Freq: Once | INTRAVENOUS | Status: AC
  Administered 2017-02-21: 1000 mL via INTRAVENOUS

## 2017-02-21 MED ORDER — LABETALOL HCL 5 MG/ML IV SOLN
INTRAVENOUS | Status: DC | PRN
  Administered 2017-02-21: 20 mg via INTRAVENOUS

## 2017-02-21 MED ORDER — KETOROLAC TROMETHAMINE 30 MG/ML IJ SOLN
30.0000 mg | Freq: Once | INTRAMUSCULAR | Status: AC
  Administered 2017-02-21: 30 mg via INTRAVENOUS

## 2017-02-21 MED ORDER — SUCCINYLCHOLINE CHLORIDE 20 MG/ML IJ SOLN
INTRAMUSCULAR | Status: AC
  Filled 2017-02-21: qty 1

## 2017-02-21 MED ORDER — METHOHEXITAL SODIUM 0.5 G IJ SOLR
INTRAMUSCULAR | Status: AC
  Filled 2017-02-21: qty 500

## 2017-02-21 MED ORDER — GLYCOPYRROLATE 0.2 MG/ML IJ SOLN
INTRAMUSCULAR | Status: AC
  Administered 2017-02-21: 0.4 mg via INTRAVENOUS
  Filled 2017-02-21: qty 2

## 2017-02-21 MED ORDER — LABETALOL HCL 5 MG/ML IV SOLN
INTRAVENOUS | Status: AC
  Filled 2017-02-21: qty 4

## 2017-02-21 MED ORDER — SODIUM CHLORIDE 0.9 % IV SOLN
INTRAVENOUS | Status: DC | PRN
  Administered 2017-02-21: 10:00:00 via INTRAVENOUS

## 2017-02-21 MED ORDER — METHOHEXITAL SODIUM 100 MG/10ML IV SOSY
PREFILLED_SYRINGE | INTRAVENOUS | Status: DC | PRN
  Administered 2017-02-21: 70 mg via INTRAVENOUS

## 2017-02-21 MED ORDER — GLYCOPYRROLATE 0.2 MG/ML IJ SOLN
0.4000 mg | Freq: Once | INTRAMUSCULAR | Status: AC
  Administered 2017-02-21: 0.4 mg via INTRAVENOUS

## 2017-02-21 MED ORDER — ESMOLOL HCL 100 MG/10ML IV SOLN
INTRAVENOUS | Status: AC
  Filled 2017-02-21: qty 10

## 2017-02-21 NOTE — Discharge Instructions (Signed)
1)  The drugs that you have been given will stay in your system until tomorrow so for the       next 24 hours you should not:  A. Drive an automobile  B. Make any legal decisions  C. Drink any alcoholic beverages  2)  You may resume your regular meals upon return home.  3)  A responsible adult must take you home.  Someone should stay with you for a few          hours, then be available by phone for the remainder of the treatment day.  4)  You May experience any of the following symptoms:  Headache, Nausea and a dry mouth (due to the medications you were given),  temporary memory loss and some confusion, or sore muscles (a warm bath  should help this).  If you you experience any of these symptoms let us know on                your return visit.  5)  Report any of the following: any acute discomfort, severe headache, or temperature        greater than 100.5 F.   Also report any unusual redness, swelling, drainage, or pain         at your IV site.    You may report Symptoms to:  [place] at [facility]          Phone: [phone], ECT Department           or Dr. Prescott Gum office [phone]  6)  Your next ECT Treatment is Day Friday Date April 20th at 0815  We will call 2 days prior to your scheduled appointment for arrival times.  7)  Nothing to eat or drink after midnight the night before your procedure.  8)  Take .    With a sip of water the morning of your procedure.  9)  Other Instructions: Call [phone] to cancel the morning of your procedure due         to illness or emergency.  10) We will call within 72 hours to assess how you are feeling.

## 2017-02-21 NOTE — Anesthesia Preprocedure Evaluation (Signed)
Anesthesia Evaluation  Patient identified by MRN, date of birth, ID band Patient awake    Reviewed: Allergy & Precautions, H&P , NPO status , Patient's Chart, lab work & pertinent test results  History of Anesthesia Complications Negative for: history of anesthetic complications  Airway Mallampati: II  TM Distance: >3 FB Neck ROM: full    Dental  (+) Poor Dentition, Chipped   Pulmonary sleep apnea , neg COPD,    Pulmonary exam normal breath sounds clear to auscultation       Cardiovascular hypertension, Pt. on medications (-) CAD and (-) Past MI negative cardio ROS Normal cardiovascular exam Rhythm:regular Rate:Normal     Neuro/Psych PSYCHIATRIC DISORDERS Depression Bipolar Disorder  Neuromuscular disease negative neurological ROS     GI/Hepatic negative GI ROS, Neg liver ROS, GERD  Controlled,  Endo/Other  diabetes, Type 2, Oral Hypoglycemic Agents  Renal/GU negative Renal ROS  negative genitourinary   Musculoskeletal   Abdominal (+) + obese,   Peds  Hematology negative hematology ROS (+)   Anesthesia Other Findings Past Medical History: No date: Depression No date: Diabetes mellitus without complication ([place]) 1/58/30: Diabetic peripheral neuropathy ([place]) 03/07/15: Diabetic peripheral neuropathy ([place]) 03/07/15: Diabetic peripheral neuropathy ([facility]) No date: GERD (gastroesophageal reflux disease) 03/07/15: Hypercholesterolemia No date: Hypertension 03/07/15: Obesity 03/07/15: Personality disorder 03/07/15: Sinus tachycardia ([place])     Comment: history of 03/07/15: Suicidal thoughts Past Surgical History: 03/07/15: electroconvulsion therapy BMI    Body Mass Index:  36.44 kg/m     Reproductive/Obstetrics                             Anesthesia Physical  Anesthesia Plan  ASA: III  Anesthesia Plan: General   Post-op Pain Management:    Induction: Intravenous  Airway Management  Planned: Mask  Additional Equipment:   Intra-op Plan:   Post-operative Plan:   Informed Consent: I have reviewed the patients History and Physical, chart, labs and discussed the procedure including the risks, benefits and alternatives for the proposed anesthesia with the patient or authorized representative who has indicated his/her understanding and acceptance.   Dental Advisory Given  Plan Discussed with: CRNA and Anesthesiologist  Anesthesia Plan Comments:         Anesthesia Quick Evaluation

## 2017-02-21 NOTE — Progress Notes (Signed)
Very restless upon arrival to pacu   Calming down now

## 2017-02-21 NOTE — Progress Notes (Signed)
A little agitated but manageable

## 2017-02-21 NOTE — Anesthesia Postprocedure Evaluation (Signed)
Anesthesia Post Note  Patient: Melinda [name]  Procedure(s) Performed: * No procedures listed *  Patient location during evaluation: PACU Anesthesia Type: General Level of consciousness: awake and alert Pain management: pain level controlled Vital Signs Assessment: post-procedure vital signs reviewed and stable Respiratory status: spontaneous breathing, nonlabored ventilation, respiratory function stable and patient connected to nasal cannula oxygen Cardiovascular status: blood pressure returned to baseline and stable Postop Assessment: no signs of nausea or vomiting Anesthetic complications: no     Last Vitals:  Vitals:   02/21/17 1051 02/21/17 1100  BP: 117/67 (!) 154/88  Pulse: 98 92  Resp: (!) 23 18  Temp:      Last Pain:  Vitals:   02/21/17 0825  TempSrc: Oral                 Precious Haws Dvonte Gatliff

## 2017-02-21 NOTE — Transfer of Care (Addendum)
Immediate Anesthesia Transfer of Care Note  Patient: Melinda [name]  Procedure(s) Performed: ECT  Patient Location: PACU  Anesthesia Type:General  Level of Consciousness: sedated  Airway & Oxygen Therapy: Patient Spontanous Breathing and Patient connected to face mask oxygen  Post-op Assessment: Report given to RN and Post -op Vital signs reviewed and stable  Post vital signs: Reviewed and stable  Last Vitals:  Vitals:   02/21/17 1036 02/21/17 1040  BP: 135/71 117/70  Pulse: (!) 101 (!) 101  Resp: 18 19  Temp:      Last Pain:  Vitals:   02/21/17 0825  TempSrc: Oral         Complications: No apparent anesthesia complications

## 2017-02-21 NOTE — Anesthesia Procedure Notes (Signed)
Date/Time: 02/21/2017 10:18 AM Performed by: Dionne Bucy Pre-anesthesia Checklist: Patient identified, Emergency Drugs available, Suction available and Patient being monitored Patient Re-evaluated:Patient Re-evaluated prior to inductionOxygen Delivery Method: Circle system utilized Preoxygenation: Pre-oxygenation with 100% oxygen Intubation Type: IV induction Ventilation: Mask ventilation without difficulty and Mask ventilation throughout procedure Airway Equipment and Method: Bite block Placement Confirmation: positive ETCO2 Dental Injury: Teeth and Oropharynx as per pre-operative assessment

## 2017-02-21 NOTE — Anesthesia Post-op Follow-up Note (Cosign Needed)
Anesthesia QCDR form completed.        

## 2017-02-21 NOTE — Procedures (Signed)
ECT SERVICES Physician's Interval Evaluation & Treatment Note  Patient Identification: Melinda [name] MRN:  [id number] Date of Evaluation:  02/21/2017 TX #: [id number]  MADRS:   MMSE:   P.E. Findings:  Small scratch on the right side of the face nothing remarkable otherwise physical the same as usual  Psychiatric Interval Note:  Mood is been a little bit more down but not profoundly so  Subjective:  Patient is a 46 y.o. female seen for evaluation for Electroconvulsive Therapy. Slightly more down more scratching of the face  Treatment Summary:   []   Right Unilateral             [x]  Bilateral   % Energy : 1.0 ms 35%   Impedance: 1220 ohms  Seizure Energy Index: No reading  Postictal Suppression Index: No reading  Seizure Concordance Index: 93%  Medications  Pre Shock: Robinul 0.4 mg labetalol 20 mg esmolol 10 mg Toradol 30 mg Brevital 70 mg succinylcholine 100 mg  Post Shock:    Seizure Duration: 38 seconds by EMG 68 seconds by EEG   Comments: Good-looking seizure despite the computers lack of readings. Follow-up one week.   Lungs:  [x]   Clear to auscultation               []  Other:   Heart:    [x]   Regular rhythm             []  irregular rhythm    [x]   Previous H&P reviewed, patient examined and there are NO CHANGES                 []   Previous H&P reviewed, patient examined and there are changes noted.   [name], MD 4/13/201810:13 AM

## 2017-02-21 NOTE — H&P (Signed)
Melinda [name] is an 46 y.o. female.   Chief Complaint: Has a scratch on her face and her mood is been a little bit more down. HPI: Chronic depression fairly stable with maintenance ECT a little bit more down this week no clear reason  Past Medical History:  Diagnosis Date  . Depression   . Diabetes mellitus without complication ([place])   . Diabetic peripheral neuropathy ([place]) 03/07/15  . Diabetic peripheral neuropathy ([place]) 03/07/15  . Diabetic peripheral neuropathy ([place]) 03/07/15  . GERD (gastroesophageal reflux disease)   . Hypercholesterolemia 03/07/15  . Hypertension   . Obesity 03/07/15  . Personality disorder 03/07/15  . Sinus tachycardia 03/07/15   history of  . Suicidal thoughts 03/07/15    Past Surgical History:  Procedure Laterality Date  . electroconvulsion therapy  03/07/15    Family History  Problem Relation Age of Onset  . Hypertension Father   . Diabetes Mother    Social History:  reports that she has never smoked. She has never used smokeless tobacco. She reports that she does not drink alcohol or use drugs.  Allergies:  Allergies  Allergen Reactions  . Prednisone     Increases blood sugar     (Not in a hospital admission)  Results for orders placed or performed during the hospital encounter of 02/21/17 (from the past 48 hour(s))  Glucose, capillary     Status: None   Collection Time: 02/21/17  8:26 AM  Result Value Ref Range   Glucose-Capillary 98 65 - 99 mg/dL   No results found.  Review of Systems  Constitutional: Negative.   HENT: Negative.   Eyes: Negative.   Respiratory: Negative.   Cardiovascular: Negative.   Gastrointestinal: Negative.   Musculoskeletal: Negative.   Skin: Negative.   Neurological: Negative.   Psychiatric/Behavioral: Positive for depression. Negative for hallucinations, memory loss, substance abuse and suicidal ideas. The patient is not nervous/anxious and does not have insomnia.     Blood pressure (!) 154/75, pulse (!)  104, temperature 98 F (36.7 C), temperature source Oral, resp. rate 18, height 5\' 5"  (1.651 m), weight 106.6 kg (235 lb), SpO2 98 %. Physical Exam  Nursing note and vitals reviewed. Constitutional: She appears well-developed and well-nourished.  HENT:  Head: Normocephalic and atraumatic.  Eyes: Conjunctivae are normal. Pupils are equal, round, and reactive to light.  Neck: Normal range of motion.  Cardiovascular: Normal heart sounds.   Respiratory: Effort normal.  GI: Soft.  Musculoskeletal: Normal range of motion.  Neurological: She is alert.  Skin: Skin is warm and dry.  Psychiatric: Judgment normal. Her affect is blunt. Her speech is delayed. She is slowed. Cognition and memory are normal. She expresses no suicidal ideation.     Assessment/Plan Not suicidal nonpsychotic continue weekly ECT after treatment today.  [name], MD 02/21/2017, 10:12 AM

## 2017-02-28 ENCOUNTER — Encounter: Payer: Self-pay | Admitting: Anesthesiology

## 2017-02-28 ENCOUNTER — Other Ambulatory Visit: Payer: Self-pay | Admitting: Psychiatry

## 2017-02-28 ENCOUNTER — Ambulatory Visit
Admission: RE | Admit: 2017-02-28 | Discharge: 2017-02-28 | Disposition: A | Discharge status: Home / Self Care | Payer: Medicare PPO | Source: Ambulatory Visit | Attending: Psychiatry | Admitting: Psychiatry

## 2017-02-28 DIAGNOSIS — F339 Major depressive disorder, recurrent, unspecified: Secondary | ICD-10-CM | POA: Diagnosis not present

## 2017-02-28 DIAGNOSIS — I10 Essential (primary) hypertension: Secondary | ICD-10-CM | POA: Diagnosis not present

## 2017-02-28 DIAGNOSIS — E7800 Pure hypercholesterolemia, unspecified: Secondary | ICD-10-CM | POA: Diagnosis not present

## 2017-02-28 DIAGNOSIS — E669 Obesity, unspecified: Secondary | ICD-10-CM | POA: Diagnosis not present

## 2017-02-28 DIAGNOSIS — E1142 Type 2 diabetes mellitus with diabetic polyneuropathy: Secondary | ICD-10-CM | POA: Diagnosis not present

## 2017-02-28 DIAGNOSIS — K219 Gastro-esophageal reflux disease without esophagitis: Secondary | ICD-10-CM | POA: Insufficient documentation

## 2017-02-28 DIAGNOSIS — F329 Major depressive disorder, single episode, unspecified: Secondary | ICD-10-CM | POA: Diagnosis not present

## 2017-02-28 DIAGNOSIS — F332 Major depressive disorder, recurrent severe without psychotic features: Secondary | ICD-10-CM

## 2017-02-28 DIAGNOSIS — E119 Type 2 diabetes mellitus without complications: Secondary | ICD-10-CM | POA: Diagnosis not present

## 2017-02-28 LAB — GLUCOSE, CAPILLARY: Glucose-Capillary: 105 mg/dL — ABNORMAL HIGH (ref 65–99)

## 2017-02-28 MED ORDER — SUCCINYLCHOLINE CHLORIDE 200 MG/10ML IV SOSY
PREFILLED_SYRINGE | INTRAVENOUS | Status: DC | PRN
  Administered 2017-02-28: 100 mg via INTRAVENOUS

## 2017-02-28 MED ORDER — GLYCOPYRROLATE 0.2 MG/ML IJ SOLN
0.4000 mg | Freq: Once | INTRAMUSCULAR | Status: AC
  Administered 2017-02-28: 0.4 mg via INTRAVENOUS

## 2017-02-28 MED ORDER — GLYCOPYRROLATE 0.2 MG/ML IJ SOLN
INTRAMUSCULAR | Status: AC
  Administered 2017-02-28: 0.4 mg via INTRAVENOUS
  Filled 2017-02-28: qty 2

## 2017-02-28 MED ORDER — METHOHEXITAL SODIUM 100 MG/10ML IV SOSY
PREFILLED_SYRINGE | INTRAVENOUS | Status: AC
  Filled 2017-02-28: qty 100

## 2017-02-28 MED ORDER — KETOROLAC TROMETHAMINE 30 MG/ML IJ SOLN
30.0000 mg | Freq: Once | INTRAMUSCULAR | Status: AC
  Administered 2017-02-28: 30 mg via INTRAVENOUS

## 2017-02-28 MED ORDER — ESMOLOL HCL 100 MG/10ML IV SOLN
INTRAVENOUS | Status: AC
  Filled 2017-02-28: qty 10

## 2017-02-28 MED ORDER — KETOROLAC TROMETHAMINE 30 MG/ML IJ SOLN
INTRAMUSCULAR | Status: AC
  Administered 2017-02-28: 30 mg via INTRAVENOUS
  Filled 2017-02-28: qty 1

## 2017-02-28 MED ORDER — SODIUM CHLORIDE 0.9 % IV SOLN
INTRAVENOUS | Status: DC | PRN
  Administered 2017-02-28: 10:00:00 via INTRAVENOUS

## 2017-02-28 MED ORDER — ESMOLOL HCL 100 MG/10ML IV SOLN
INTRAVENOUS | Status: DC | PRN
  Administered 2017-02-28: 20 mg via INTRAVENOUS

## 2017-02-28 MED ORDER — LABETALOL HCL 5 MG/ML IV SOLN
INTRAVENOUS | Status: AC
  Filled 2017-02-28: qty 4

## 2017-02-28 MED ORDER — SUCCINYLCHOLINE CHLORIDE 20 MG/ML IJ SOLN
INTRAMUSCULAR | Status: AC
  Filled 2017-02-28: qty 1

## 2017-02-28 MED ORDER — METHOHEXITAL SODIUM 100 MG/10ML IV SOSY
PREFILLED_SYRINGE | INTRAVENOUS | Status: DC | PRN
  Administered 2017-02-28: 70 mg via INTRAVENOUS

## 2017-02-28 MED ORDER — LABETALOL HCL 5 MG/ML IV SOLN
INTRAVENOUS | Status: DC | PRN
  Administered 2017-02-28: 20 mg via INTRAVENOUS

## 2017-02-28 MED ORDER — SODIUM CHLORIDE 0.9 % IV SOLN
500.0000 mL | Freq: Once | INTRAVENOUS | Status: AC
  Administered 2017-02-28: 10:00:00 via INTRAVENOUS

## 2017-02-28 MED ORDER — FENTANYL CITRATE (PF) 100 MCG/2ML IJ SOLN: 25.0000 ug | INTRAMUSCULAR | Status: DC | PRN

## 2017-02-28 MED ORDER — ONDANSETRON HCL 4 MG/2ML IJ SOLN: 4.0000 mg | Freq: Once | INTRAMUSCULAR | Status: DC | PRN

## 2017-02-28 NOTE — Anesthesia Preprocedure Evaluation (Signed)
Anesthesia Evaluation  Patient identified by MRN, date of birth, ID band Patient awake    Reviewed: Allergy & Precautions, NPO status , Patient's Chart, lab work & pertinent test results, reviewed documented beta blocker date and time   Airway Mallampati: III  TM Distance: >3 FB     Dental  (+) Chipped   Pulmonary sleep apnea ,           Cardiovascular hypertension, Pt. on medications      Neuro/Psych PSYCHIATRIC DISORDERS Depression Bipolar Disorder  Neuromuscular disease    GI/Hepatic GERD  Controlled,  Endo/Other  diabetes, Poorly Controlled  Renal/GU      Musculoskeletal   Abdominal   Peds  Hematology   Anesthesia Other Findings Obese.  Reproductive/Obstetrics                             Anesthesia Physical Anesthesia Plan  ASA: III  Anesthesia Plan: General   Post-op Pain Management:    Induction: Intravenous  Airway Management Planned: Mask  Additional Equipment:   Intra-op Plan:   Post-operative Plan:   Informed Consent: I have reviewed the patients History and Physical, chart, labs and discussed the procedure including the risks, benefits and alternatives for the proposed anesthesia with the patient or authorized representative who has indicated his/her understanding and acceptance.     Plan Discussed with: CRNA  Anesthesia Plan Comments:         Anesthesia Quick Evaluation

## 2017-02-28 NOTE — Anesthesia Procedure Notes (Signed)
Date/Time: 02/28/2017 10:23 AM Performed by: Dionne Bucy Pre-anesthesia Checklist: Patient identified, Emergency Drugs available, Suction available and Patient being monitored Patient Re-evaluated:Patient Re-evaluated prior to inductionOxygen Delivery Method: Circle system utilized Preoxygenation: Pre-oxygenation with 100% oxygen Intubation Type: IV induction Ventilation: Mask ventilation without difficulty and Mask ventilation throughout procedure Airway Equipment and Method: Bite block Placement Confirmation: positive ETCO2 Dental Injury: Teeth and Oropharynx as per pre-operative assessment

## 2017-02-28 NOTE — Procedures (Signed)
ECT SERVICES Physician's Interval Evaluation & Treatment Note  Patient Identification: MADDUX FIRST MRN:  [id number] Date of Evaluation:  02/28/2017 TX #: [id number]  MADRS: 16  MMSE: 28  P.E. Findings:  No change to physical exam. Normal.  Psychiatric Interval Note:  Down flat a little more irritable.  Subjective:  Patient is a 46 y.o. female seen for evaluation for Electroconvulsive Therapy. Had an argument with her son this morning which got on her nerves.  Treatment Summary:   []   Right Unilateral             [x]  Bilateral   % Energy : 1.0 ms 35%   Impedance: 1540 ohms  Seizure Energy Index: 3176 V squared  Postictal Suppression Index: 74%  Seizure Concordance Index: 92%  Medications  Pre Shock: Robinul 0.4 mg, Toradol 30 mg, labetalol 20 mg, esmolol 10 mg, Brevital 70 mg, succinylcholine 100 mg  Post Shock:    Seizure Duration: 32 seconds by EMG 61 seconds by EEG   Comments: Follow-up one week   Lungs:  [x]   Clear to auscultation               []  Other:   Heart:    [x]   Regular rhythm             []  irregular rhythm    [x]   Previous H&P reviewed, patient examined and there are NO CHANGES                 []   Previous H&P reviewed, patient examined and there are changes noted.   [name], MD 4/20/201810:16 AM

## 2017-02-28 NOTE — H&P (Signed)
Melinda [name] is an 46 y.o. female.   Chief Complaint: More down particularly today. Argument with her son. HPI: Chronic depression  Past Medical History:  Diagnosis Date  . Depression   . Diabetes mellitus without complication ([place])   . Diabetic peripheral neuropathy ([place]) 03/07/15  . Diabetic peripheral neuropathy ([place]) 03/07/15  . Diabetic peripheral neuropathy ([place]) 03/07/15  . GERD (gastroesophageal reflux disease)   . Hypercholesterolemia 03/07/15  . Hypertension   . Obesity 03/07/15  . Personality disorder 03/07/15  . Sinus tachycardia 03/07/15   history of  . Suicidal thoughts 03/07/15    Past Surgical History:  Procedure Laterality Date  . electroconvulsion therapy  03/07/15    Family History  Problem Relation Age of Onset  . Hypertension Father   . Diabetes Mother    Social History:  reports that she has never smoked. She has never used smokeless tobacco. She reports that she does not drink alcohol or use drugs.  Allergies:  Allergies  Allergen Reactions  . Prednisone     Increases blood sugar     (Not in a hospital admission)  Results for orders placed or performed during the hospital encounter of 02/28/17 (from the past 48 hour(s))  Glucose, capillary     Status: Abnormal   Collection Time: 02/28/17  8:29 AM  Result Value Ref Range   Glucose-Capillary 105 (H) 65 - 99 mg/dL   No results found.  Review of Systems  Constitutional: Negative.   HENT: Negative.   Eyes: Negative.   Respiratory: Negative.   Cardiovascular: Negative.   Gastrointestinal: Negative.   Musculoskeletal: Negative.   Skin: Negative.   Neurological: Negative.   Psychiatric/Behavioral: Positive for depression. Negative for hallucinations, memory loss, substance abuse and suicidal ideas. The patient is not nervous/anxious and does not have insomnia.     Blood pressure 137/77, pulse 92, temperature 97.9 F (36.6 C), temperature source Oral, resp. rate 16, height 5\' 5"  (1.651 m),  weight 106.6 kg (235 lb), last menstrual period 11/30/2016, SpO2 100 %. Physical Exam  Nursing note and vitals reviewed. Constitutional: She appears well-developed and well-nourished.  HENT:  Head: Normocephalic and atraumatic.  Eyes: Conjunctivae are normal. Pupils are equal, round, and reactive to light.  Neck: Normal range of motion.  Cardiovascular: Regular rhythm and normal heart sounds.   Respiratory: Effort normal and breath sounds normal. No respiratory distress.  GI: Soft.  Musculoskeletal: Normal range of motion.  Neurological: She is alert.  Skin: Skin is warm and dry.  Psychiatric: Judgment normal. Her speech is delayed. She is slowed. Cognition and memory are normal. She exhibits a depressed mood. She expresses no suicidal ideation.     Assessment/Plan For sees benefit from maintenance ECT continues treatment. Spent some time with her trying to do some therapy behavioral change encouragement. No change to medicine. Follow-up one week  [name], MD 02/28/2017, 10:14 AM

## 2017-02-28 NOTE — Anesthesia Post-op Follow-up Note (Cosign Needed)
Anesthesia QCDR form completed.        

## 2017-02-28 NOTE — Anesthesia Postprocedure Evaluation (Signed)
Anesthesia Post Note  Patient: Melinda [name]  Procedure(s) Performed: * No procedures listed *  Patient location during evaluation: PACU Anesthesia Type: General Level of consciousness: awake and alert Pain management: pain level controlled Vital Signs Assessment: post-procedure vital signs reviewed and stable Respiratory status: spontaneous breathing, nonlabored ventilation, respiratory function stable and patient connected to nasal cannula oxygen Cardiovascular status: blood pressure returned to baseline and stable Postop Assessment: no signs of nausea or vomiting Anesthetic complications: no     Last Vitals:  Vitals:   02/28/17 1103 02/28/17 1118  BP: (!) 110/91 129/62  Pulse: 94 92  Resp: (!) 27 16  Temp: 36.9 C     Last Pain:  Vitals:   02/28/17 1118  TempSrc:   PainSc: 1                  [name]

## 2017-02-28 NOTE — Discharge Instructions (Signed)
1)  The drugs that you have been given will stay in your system until tomorrow so for the       next 24 hours you should not:  A. Drive an automobile  B. Make any legal decisions  C. Drink any alcoholic beverages  2)  You may resume your regular meals upon return home.  3)  A responsible adult must take you home.  Someone should stay with you for a few          hours, then be available by phone for the remainder of the treatment day.  4)  You May experience any of the following symptoms:  Headache, Nausea and a dry mouth (due to the medications you were given),  temporary memory loss and some confusion, or sore muscles (a warm bath  should help this).  If you you experience any of these symptoms let us know on                your return visit.  5)  Report any of the following: any acute discomfort, severe headache, or temperature        greater than 100.5 F.   Also report any unusual redness, swelling, drainage, or pain         at your IV site.    You may report Symptoms to:  [place] at [facility]          Phone: [phone], ECT Department           or Dr. Prescott Gum office [phone]  6)  Your next ECT Treatment is Day Friday  Date March 07, 2017 at 8:15 am  We will call 2 days prior to your scheduled appointment for arrival times.  7)  Nothing to eat or drink after midnight the night before your procedure.  8)  Take .     With a sip of water the morning of your procedure.  9)  Other Instructions: Call [phone] to cancel the morning of your procedure due         to illness or emergency.  10) We will call within 72 hours to assess how you are feeling.

## 2017-02-28 NOTE — Transfer of Care (Signed)
Immediate Anesthesia Transfer of Care Note  Patient: Melinda [name]  Procedure(s) Performed: ECT  Patient Location: PACU  Anesthesia Type:General  Level of Consciousness: sedated  Airway & Oxygen Therapy: Patient Spontanous Breathing  Post-op Assessment: Report given to RN and Post -op Vital signs reviewed and stable  Post vital signs: Reviewed and stable  Last Vitals:  Vitals:   02/28/17 0833 02/28/17 1034  BP: 137/77   Pulse: 92 98  Resp: 16 21  Temp: 36.6 C (P) 36.4 C    Last Pain:  Vitals:   02/28/17 0833  TempSrc: Oral  PainSc: 0-No pain         Complications: No apparent anesthesia complications

## 2017-03-06 ENCOUNTER — Other Ambulatory Visit: Payer: Self-pay | Admitting: Psychiatry

## 2017-03-07 ENCOUNTER — Ambulatory Visit
Admission: RE | Admit: 2017-03-07 | Discharge: 2017-03-07 | Disposition: A | Discharge status: Home / Self Care | Payer: Medicare PPO | Source: Ambulatory Visit | Attending: Psychiatry | Admitting: Psychiatry

## 2017-03-07 ENCOUNTER — Encounter: Payer: Self-pay | Admitting: Anesthesiology

## 2017-03-07 DIAGNOSIS — E7800 Pure hypercholesterolemia, unspecified: Secondary | ICD-10-CM | POA: Diagnosis not present

## 2017-03-07 DIAGNOSIS — F329 Major depressive disorder, single episode, unspecified: Secondary | ICD-10-CM | POA: Diagnosis not present

## 2017-03-07 DIAGNOSIS — F332 Major depressive disorder, recurrent severe without psychotic features: Secondary | ICD-10-CM

## 2017-03-07 DIAGNOSIS — E1142 Type 2 diabetes mellitus with diabetic polyneuropathy: Secondary | ICD-10-CM | POA: Insufficient documentation

## 2017-03-07 DIAGNOSIS — I10 Essential (primary) hypertension: Secondary | ICD-10-CM | POA: Diagnosis not present

## 2017-03-07 DIAGNOSIS — E669 Obesity, unspecified: Secondary | ICD-10-CM | POA: Insufficient documentation

## 2017-03-07 DIAGNOSIS — K219 Gastro-esophageal reflux disease without esophagitis: Secondary | ICD-10-CM | POA: Insufficient documentation

## 2017-03-07 DIAGNOSIS — F339 Major depressive disorder, recurrent, unspecified: Secondary | ICD-10-CM | POA: Diagnosis not present

## 2017-03-07 DIAGNOSIS — E119 Type 2 diabetes mellitus without complications: Secondary | ICD-10-CM | POA: Diagnosis not present

## 2017-03-07 DIAGNOSIS — F3189 Other bipolar disorder: Secondary | ICD-10-CM | POA: Diagnosis not present

## 2017-03-07 LAB — GLUCOSE, CAPILLARY: Glucose-Capillary: 102 mg/dL — ABNORMAL HIGH (ref 65–99)

## 2017-03-07 MED ORDER — ESMOLOL HCL 100 MG/10ML IV SOLN
INTRAVENOUS | Status: DC | PRN
  Administered 2017-03-07: 20 mg via INTRAVENOUS

## 2017-03-07 MED ORDER — ONDANSETRON HCL 4 MG/2ML IJ SOLN: 4.0000 mg | Freq: Once | INTRAMUSCULAR | Status: DC | PRN

## 2017-03-07 MED ORDER — SUCCINYLCHOLINE CHLORIDE 20 MG/ML IJ SOLN
INTRAMUSCULAR | Status: AC
  Filled 2017-03-07: qty 1

## 2017-03-07 MED ORDER — SODIUM CHLORIDE 0.9 % IV SOLN
INTRAVENOUS | Status: DC | PRN
  Administered 2017-03-07: 10:00:00 via INTRAVENOUS

## 2017-03-07 MED ORDER — GLYCOPYRROLATE 0.2 MG/ML IJ SOLN
INTRAMUSCULAR | Status: AC
  Filled 2017-03-07: qty 2

## 2017-03-07 MED ORDER — METHOHEXITAL SODIUM 100 MG/10ML IV SOSY
PREFILLED_SYRINGE | INTRAVENOUS | Status: AC
  Filled 2017-03-07: qty 100

## 2017-03-07 MED ORDER — METHOHEXITAL SODIUM 100 MG/10ML IV SOSY
PREFILLED_SYRINGE | INTRAVENOUS | Status: DC | PRN
  Administered 2017-03-07: 70 mg via INTRAVENOUS

## 2017-03-07 MED ORDER — FENTANYL CITRATE (PF) 100 MCG/2ML IJ SOLN: 25.0000 ug | INTRAMUSCULAR | Status: DC | PRN

## 2017-03-07 MED ORDER — LABETALOL HCL 5 MG/ML IV SOLN
INTRAVENOUS | Status: AC
  Filled 2017-03-07: qty 4

## 2017-03-07 MED ORDER — SODIUM CHLORIDE 0.9 % IV SOLN
500.0000 mL | Freq: Once | INTRAVENOUS | Status: AC
  Administered 2017-03-07: 1000 mL via INTRAVENOUS

## 2017-03-07 MED ORDER — GLYCOPYRROLATE 0.2 MG/ML IJ SOLN
0.4000 mg | Freq: Once | INTRAMUSCULAR | Status: AC
  Administered 2017-03-07: 0.4 mg via INTRAVENOUS

## 2017-03-07 MED ORDER — KETOROLAC TROMETHAMINE 30 MG/ML IJ SOLN
30.0000 mg | Freq: Once | INTRAMUSCULAR | Status: AC
  Administered 2017-03-07: 30 mg via INTRAVENOUS

## 2017-03-07 MED ORDER — SUCCINYLCHOLINE CHLORIDE 200 MG/10ML IV SOSY
PREFILLED_SYRINGE | INTRAVENOUS | Status: DC | PRN
  Administered 2017-03-07: 100 mg via INTRAVENOUS

## 2017-03-07 MED ORDER — LABETALOL HCL 5 MG/ML IV SOLN
INTRAVENOUS | Status: DC | PRN
  Administered 2017-03-07: 20 mg via INTRAVENOUS

## 2017-03-07 MED ORDER — KETOROLAC TROMETHAMINE 30 MG/ML IJ SOLN
INTRAMUSCULAR | Status: AC
  Administered 2017-03-07: 30 mg via INTRAVENOUS
  Filled 2017-03-07: qty 1

## 2017-03-07 NOTE — Discharge Instructions (Signed)
1)  The drugs that you have been given will stay in your system until tomorrow so for the       next 24 hours you should not:  A. Drive an automobile  B. Make any legal decisions  C. Drink any alcoholic beverages  2)  You may resume your regular meals upon return home.  3)  A responsible adult must take you home.  Someone should stay with you for a few          hours, then be available by phone for the remainder of the treatment day.  4)  You May experience any of the following symptoms:  Headache, Nausea and a dry mouth (due to the medications you were given),  temporary memory loss and some confusion, or sore muscles (a warm bath  should help this).  If you you experience any of these symptoms let us know on                your return visit.  5)  Report any of the following: any acute discomfort, severe headache, or temperature        greater than 100.5 F.   Also report any unusual redness, swelling, drainage, or pain         at your IV site.    You may report Symptoms to:  [place] at [facility]          Phone: [phone], ECT Department           or Dr. Prescott Gum office [phone]  6)  Your next ECT Treatment is Day FRIDAY  Date Mar 14 2017  We will call 2 days prior to your scheduled appointment for arrival times.  7)  Nothing to eat or drink after midnight the night before your procedure.  8)  Take .   With a sip of water the morning of your procedure.  9)  Other Instructions: Call [phone] to cancel the morning of your procedure due         to illness or emergency.  10) We will call within 72 hours to assess how you are feeling.

## 2017-03-07 NOTE — Anesthesia Post-op Follow-up Note (Cosign Needed)
Anesthesia QCDR form completed.        

## 2017-03-07 NOTE — Anesthesia Procedure Notes (Signed)
Date/Time: 03/07/2017 10:26 AM Performed by: Dionne Bucy Pre-anesthesia Checklist: Patient identified, Emergency Drugs available, Suction available and Patient being monitored Patient Re-evaluated:Patient Re-evaluated prior to inductionOxygen Delivery Method: Circle system utilized Preoxygenation: Pre-oxygenation with 100% oxygen Intubation Type: IV induction Ventilation: Mask ventilation without difficulty and Mask ventilation throughout procedure Airway Equipment and Method: Bite block Placement Confirmation: positive ETCO2 Dental Injury: Teeth and Oropharynx as per pre-operative assessment

## 2017-03-07 NOTE — Anesthesia Preprocedure Evaluation (Signed)
Anesthesia Evaluation  Patient identified by MRN, date of birth, ID band Patient awake    Reviewed: Allergy & Precautions, NPO status , Patient's Chart, lab work & pertinent test results, reviewed documented beta blocker date and time   Airway Mallampati: III  TM Distance: >3 FB     Dental  (+) Chipped   Pulmonary sleep apnea ,           Cardiovascular hypertension, Pt. on medications      Neuro/Psych PSYCHIATRIC DISORDERS Depression Bipolar Disorder  Neuromuscular disease    GI/Hepatic GERD  Controlled,  Endo/Other  diabetes, Type 2  Renal/GU      Musculoskeletal   Abdominal   Peds  Hematology   Anesthesia Other Findings   Reproductive/Obstetrics                             Anesthesia Physical Anesthesia Plan  ASA: III  Anesthesia Plan: General   Post-op Pain Management:    Induction: Intravenous  Airway Management Planned:   Additional Equipment:   Intra-op Plan:   Post-operative Plan:   Informed Consent: I have reviewed the patients History and Physical, chart, labs and discussed the procedure including the risks, benefits and alternatives for the proposed anesthesia with the patient or authorized representative who has indicated his/her understanding and acceptance.     Plan Discussed with: CRNA  Anesthesia Plan Comments:         Anesthesia Quick Evaluation

## 2017-03-07 NOTE — Transfer of Care (Addendum)
Immediate Anesthesia Transfer of Care Note  Patient: Melinda [name]  Procedure(s) Performed: ECT  Patient Location: PACU  Anesthesia Type:General  Level of Consciousness: sedated  Airway & Oxygen Therapy: Patient Spontanous Breathing  Post-op Assessment: Report given to RN and Post -op Vital signs reviewed and stable  Post vital signs: Reviewed and stable  Last Vitals:  Vitals:   03/07/17 1116 03/07/17 1130  BP: 129/90 136/89  Pulse: 92 94  Resp: 17 18  Temp:  36.2 C    Last Pain:  Vitals:   03/07/17 1130  TempSrc: Tympanic  PainSc: 1       Patients Stated Pain Goal: 0 (92/33/00 7622)  Complications: No apparent anesthesia complications

## 2017-03-07 NOTE — Anesthesia Postprocedure Evaluation (Signed)
Anesthesia Post Note  Patient: Melinda [name]  Procedure(s) Performed: * No procedures listed *  Patient location during evaluation: PACU Anesthesia Type: General Level of consciousness: awake and alert Pain management: pain level controlled Vital Signs Assessment: post-procedure vital signs reviewed and stable Respiratory status: spontaneous breathing, nonlabored ventilation, respiratory function stable and patient connected to nasal cannula oxygen Cardiovascular status: blood pressure returned to baseline and stable Postop Assessment: no signs of nausea or vomiting Anesthetic complications: no     Last Vitals:  Vitals:   03/07/17 1116 03/07/17 1130  BP: 129/90 136/89  Pulse: 92 94  Resp: 17 18  Temp:  36.2 C    Last Pain:  Vitals:   03/07/17 1130  TempSrc: Tympanic  PainSc: [place]

## 2017-03-07 NOTE — Procedures (Signed)
ECT SERVICES Physician's Interval Evaluation & Treatment Note  Patient Identification: Melinda [name] MRN:  [id number] Date of Evaluation:  03/07/2017 TX #: [id number]  MADRS:   MMSE:   P.E. Findings:  No change to physical exam  Psychiatric Interval Note:  Blunt chronic as normal  Subjective:  Patient is a 46 y.o. female seen for evaluation for Electroconvulsive Therapy. No change  Treatment Summary:   []   Right Unilateral             [x]  Bilateral   % Energy : 1.0 ms 35%   Impedance: 1110 ohms  Seizure Energy Index: 5706 V squared  Postictal Suppression Index: 69%  Seizure Concordance Index: 95%  Medications  Pre Shock: Robinul 0.4 mg Toradol 30 mg labetalol 20 mg esmolol 10 mg Brevital 70 mg succinylcholine 100 mg  Post Shock:    Seizure Duration: 36 seconds by EMG 70 seconds by EEG   Comments: Follow-up one week   Lungs:  [x]   Clear to auscultation               []  Other:   Heart:    [x]   Regular rhythm             []  irregular rhythm    [x]   Previous H&P reviewed, patient examined and there are NO CHANGES                 []   Previous H&P reviewed, patient examined and there are changes noted.   [name], MD 4/27/201810:07 AM

## 2017-03-07 NOTE — H&P (Signed)
Melinda [name] is an 46 y.o. female.   Chief Complaint: Mood still a little bit down but not as bad as last week. No other complaints HPI: Chronic depression Moderately a day with ECT  Past Medical History:  Diagnosis Date  . Depression   . Diabetes mellitus without complication ([place])   . Diabetic peripheral neuropathy ([place]) 03/07/15  . Diabetic peripheral neuropathy ([place]) 03/07/15  . Diabetic peripheral neuropathy ([place]) 03/07/15  . GERD (gastroesophageal reflux disease)   . Hypercholesterolemia 03/07/15  . Hypertension   . Obesity 03/07/15  . Personality disorder 03/07/15  . Sinus tachycardia 03/07/15   history of  . Suicidal thoughts 03/07/15    Past Surgical History:  Procedure Laterality Date  . electroconvulsion therapy  03/07/15    Family History  Problem Relation Age of Onset  . Hypertension Father   . Diabetes Mother    Social History:  reports that she has never smoked. She has never used smokeless tobacco. She reports that she does not drink alcohol or use drugs.  Allergies:  Allergies  Allergen Reactions  . Prednisone     Increases blood sugar     (Not in a hospital admission)  Results for orders placed or performed during the hospital encounter of 03/07/17 (from the past 48 hour(s))  Glucose, capillary     Status: Abnormal   Collection Time: 03/07/17  8:27 AM  Result Value Ref Range   Glucose-Capillary 102 (H) 65 - 99 mg/dL   No results found.  Review of Systems  Constitutional: Negative.   HENT: Negative.   Eyes: Negative.   Respiratory: Negative.   Cardiovascular: Negative.   Gastrointestinal: Negative.   Musculoskeletal: Negative.   Skin: Negative.   Neurological: Negative.   Psychiatric/Behavioral: Positive for depression. Negative for hallucinations, memory loss, substance abuse and suicidal ideas. The patient is not nervous/anxious and does not have insomnia.     Blood pressure (!) 158/86, pulse (!) 111, temperature 98.1 F (36.7 C),  temperature source Oral, resp. rate 16, height 5\' 5"  (1.651 m), weight 107.5 kg (237 lb), SpO2 100 %. Physical Exam  Nursing note and vitals reviewed. Constitutional: She appears well-developed and well-nourished.  HENT:  Head: Normocephalic and atraumatic.  Eyes: Conjunctivae are normal. Pupils are equal, round, and reactive to light.  Neck: Normal range of motion.  Cardiovascular: Regular rhythm and normal heart sounds.   Respiratory: Effort normal. No respiratory distress.  GI: Soft.  Musculoskeletal: Normal range of motion.  Neurological: She is alert.  Skin: Skin is warm and dry.  Psychiatric: Judgment normal. Her affect is blunt. Her speech is delayed. She is slowed. Cognition and memory are normal. She expresses no suicidal ideation.     Assessment/Plan Treatment today continue weekly schedule  [name], MD 03/07/2017, 10:06 AM

## 2017-03-13 ENCOUNTER — Other Ambulatory Visit: Payer: Self-pay | Admitting: Psychiatry

## 2017-03-14 ENCOUNTER — Encounter: Payer: Self-pay | Admitting: Anesthesiology

## 2017-03-14 ENCOUNTER — Encounter
Admission: RE | Admit: 2017-03-14 | Discharge: 2017-03-14 | Disposition: A | Discharge status: Home / Self Care | Payer: Medicare PPO | Source: Ambulatory Visit | Attending: Psychiatry | Admitting: Psychiatry

## 2017-03-14 DIAGNOSIS — E669 Obesity, unspecified: Secondary | ICD-10-CM | POA: Diagnosis not present

## 2017-03-14 DIAGNOSIS — E7800 Pure hypercholesterolemia, unspecified: Secondary | ICD-10-CM | POA: Insufficient documentation

## 2017-03-14 DIAGNOSIS — Z8249 Family history of ischemic heart disease and other diseases of the circulatory system: Secondary | ICD-10-CM | POA: Diagnosis not present

## 2017-03-14 DIAGNOSIS — E1142 Type 2 diabetes mellitus with diabetic polyneuropathy: Secondary | ICD-10-CM | POA: Insufficient documentation

## 2017-03-14 DIAGNOSIS — I10 Essential (primary) hypertension: Secondary | ICD-10-CM | POA: Diagnosis not present

## 2017-03-14 DIAGNOSIS — F332 Major depressive disorder, recurrent severe without psychotic features: Secondary | ICD-10-CM | POA: Diagnosis not present

## 2017-03-14 DIAGNOSIS — Z833 Family history of diabetes mellitus: Secondary | ICD-10-CM | POA: Insufficient documentation

## 2017-03-14 DIAGNOSIS — Z6837 Body mass index (BMI) 37.0-37.9, adult: Secondary | ICD-10-CM | POA: Insufficient documentation

## 2017-03-14 DIAGNOSIS — K219 Gastro-esophageal reflux disease without esophagitis: Secondary | ICD-10-CM | POA: Insufficient documentation

## 2017-03-14 DIAGNOSIS — F3189 Other bipolar disorder: Secondary | ICD-10-CM | POA: Diagnosis not present

## 2017-03-14 DIAGNOSIS — F339 Major depressive disorder, recurrent, unspecified: Secondary | ICD-10-CM | POA: Diagnosis not present

## 2017-03-14 DIAGNOSIS — F609 Personality disorder, unspecified: Secondary | ICD-10-CM | POA: Diagnosis not present

## 2017-03-14 DIAGNOSIS — E119 Type 2 diabetes mellitus without complications: Secondary | ICD-10-CM | POA: Diagnosis not present

## 2017-03-14 LAB — POCT PREGNANCY, URINE: Preg Test, Ur: NEGATIVE

## 2017-03-14 LAB — GLUCOSE, CAPILLARY: Glucose-Capillary: 100 mg/dL — ABNORMAL HIGH (ref 65–99)

## 2017-03-14 MED ORDER — METHOHEXITAL SODIUM 100 MG/10ML IV SOSY
PREFILLED_SYRINGE | INTRAVENOUS | Status: DC | PRN
  Administered 2017-03-14: 70 mg via INTRAVENOUS

## 2017-03-14 MED ORDER — KETOROLAC TROMETHAMINE 30 MG/ML IJ SOLN
30.0000 mg | Freq: Once | INTRAMUSCULAR | Status: AC
  Administered 2017-03-14: 30 mg via INTRAVENOUS

## 2017-03-14 MED ORDER — METHOHEXITAL SODIUM 0.5 G IJ SOLR
INTRAMUSCULAR | Status: AC
  Filled 2017-03-14: qty 500

## 2017-03-14 MED ORDER — SODIUM CHLORIDE 0.9 % IV SOLN
500.0000 mL | Freq: Once | INTRAVENOUS | Status: AC
  Administered 2017-03-14: 1000 mL via INTRAVENOUS

## 2017-03-14 MED ORDER — SODIUM CHLORIDE 0.9 % IV SOLN
INTRAVENOUS | Status: DC | PRN
  Administered 2017-03-14 (×2): via INTRAVENOUS

## 2017-03-14 MED ORDER — SUCCINYLCHOLINE CHLORIDE 200 MG/10ML IV SOSY
PREFILLED_SYRINGE | INTRAVENOUS | Status: DC | PRN
  Administered 2017-03-14: 100 mg via INTRAVENOUS

## 2017-03-14 MED ORDER — KETOROLAC TROMETHAMINE 30 MG/ML IJ SOLN
INTRAMUSCULAR | Status: AC
  Filled 2017-03-14: qty 1

## 2017-03-14 MED ORDER — GLYCOPYRROLATE 0.2 MG/ML IJ SOLN
0.4000 mg | Freq: Once | INTRAMUSCULAR | Status: AC
  Administered 2017-03-14: 0.4 mg via INTRAVENOUS

## 2017-03-14 MED ORDER — ESMOLOL HCL 100 MG/10ML IV SOLN
INTRAVENOUS | Status: AC
  Filled 2017-03-14: qty 10

## 2017-03-14 MED ORDER — GLYCOPYRROLATE 0.2 MG/ML IJ SOLN
INTRAMUSCULAR | Status: AC
  Filled 2017-03-14: qty 2

## 2017-03-14 MED ORDER — SUCCINYLCHOLINE CHLORIDE 20 MG/ML IJ SOLN
INTRAMUSCULAR | Status: AC
  Filled 2017-03-14: qty 1

## 2017-03-14 MED ORDER — LABETALOL HCL 5 MG/ML IV SOLN
INTRAVENOUS | Status: AC
  Filled 2017-03-14: qty 4

## 2017-03-14 MED ORDER — ESMOLOL HCL 100 MG/10ML IV SOLN
INTRAVENOUS | Status: DC | PRN
  Administered 2017-03-14: 20 mg via INTRAVENOUS

## 2017-03-14 MED ORDER — LABETALOL HCL 5 MG/ML IV SOLN
INTRAVENOUS | Status: DC | PRN
  Administered 2017-03-14: 20 mg via INTRAVENOUS

## 2017-03-14 NOTE — H&P (Signed)
Melinda [name] is an 46 y.o. female.   Chief Complaint: Feeling a little bit down still chronic depression no suicidality no new complaints HPI: Maintenance treatment of chronic severe depression  Past Medical History:  Diagnosis Date  . Depression   . Diabetes mellitus without complication ([place])   . Diabetic peripheral neuropathy ([place]) 03/07/15  . Diabetic peripheral neuropathy ([place]) 03/07/15  . Diabetic peripheral neuropathy ([place]) 03/07/15  . GERD (gastroesophageal reflux disease)   . Hypercholesterolemia 03/07/15  . Hypertension   . Obesity 03/07/15  . Personality disorder 03/07/15  . Sinus tachycardia 03/07/15   history of  . Suicidal thoughts 03/07/15    Past Surgical History:  Procedure Laterality Date  . electroconvulsion therapy  03/07/15    Family History  Problem Relation Age of Onset  . Hypertension Father   . Diabetes Mother    Social History:  reports that she has never smoked. She has never used smokeless tobacco. She reports that she does not drink alcohol or use drugs.  Allergies:  Allergies  Allergen Reactions  . Prednisone     Increases blood sugar     (Not in a hospital admission)  Results for orders placed or performed during the hospital encounter of 03/14/17 (from the past 48 hour(s))  Pregnancy, urine POC     Status: None   Collection Time: 03/14/17  8:13 AM  Result Value Ref Range   Preg Test, Ur NEGATIVE NEGATIVE    Comment:        THE SENSITIVITY OF THIS METHODOLOGY IS >24 mIU/mL   Glucose, capillary     Status: Abnormal   Collection Time: 03/14/17  8:27 AM  Result Value Ref Range   Glucose-Capillary 100 (H) 65 - 99 mg/dL   No results found.  Review of Systems  Constitutional: Negative.   HENT: Negative.   Eyes: Negative.   Respiratory: Negative.   Cardiovascular: Negative.   Gastrointestinal: Negative.   Musculoskeletal: Negative.   Skin: Negative.   Neurological: Negative.   Psychiatric/Behavioral: Positive for depression.  Negative for hallucinations, memory loss, substance abuse and suicidal ideas. The patient is not nervous/anxious and does not have insomnia.     Blood pressure (!) 160/96, pulse (!) 107, temperature 98.2 F (36.8 C), temperature source Oral, resp. rate 16, height 5\' 5"  (1.651 m), weight 107 kg (236 lb), last menstrual period 11/26/2016, SpO2 100 %. Physical Exam  Nursing note and vitals reviewed. Constitutional: She appears well-developed and well-nourished.  HENT:  Head: Normocephalic and atraumatic.  Eyes: Conjunctivae are normal. Pupils are equal, round, and reactive to light.  Neck: Normal range of motion.  Cardiovascular: Regular rhythm and normal heart sounds.   Respiratory: Effort normal and breath sounds normal. No respiratory distress.  GI: Soft.  Musculoskeletal: Normal range of motion.  Neurological: She is alert.  Skin: Skin is warm and dry.  Psychiatric: Judgment and thought content normal. Her affect is blunt. Her speech is delayed. She is slowed. Cognition and memory are normal.     Assessment/Plan Treatment today continue weekly schedule follow-up one week.  [name], MD 03/14/2017, 10:16 AM

## 2017-03-14 NOTE — Discharge Instructions (Signed)
1)  The drugs that you have been given will stay in your system until tomorrow so for the       next 24 hours you should not:  A. Drive an automobile  B. Make any legal decisions  C. Drink any alcoholic beverages  2)  You may resume your regular meals upon return home.  3)  A responsible adult must take you home.  Someone should stay with you for a few          hours, then be available by phone for the remainder of the treatment day.  4)  You May experience any of the following symptoms:  Headache, Nausea and a dry mouth (due to the medications you were given),  temporary memory loss and some confusion, or sore muscles (a warm bath  should help this).  If you you experience any of these symptoms let us know on                your return visit.  5)  Report any of the following: any acute discomfort, severe headache, or temperature        greater than 100.5 F.   Also report any unusual redness, swelling, drainage, or pain         at your IV site.    You may report Symptoms to:  [place] at [facility]          Phone: [phone], ECT Department           or Dr. Prescott Gum office [phone]  6)  Your next ECT Treatment is Friday Mar 21, 2017  We will call 2 days prior to your scheduled appointment for arrival times.  7)  Nothing to eat or drink after midnight the night before your procedure.  8)  Take     With a sip of water the morning of your procedure.  9)  Other Instructions: Call [phone] to cancel the morning of your procedure due         to illness or emergency.  10) We will call within 72 hours to assess how you are feeling.

## 2017-03-14 NOTE — Anesthesia Preprocedure Evaluation (Signed)
Anesthesia Evaluation  Patient identified by MRN, date of birth, ID band Patient awake    Reviewed: Allergy & Precautions, H&P , NPO status , Patient's Chart, lab work & pertinent test results  History of Anesthesia Complications Negative for: history of anesthetic complications  Airway Mallampati: II  TM Distance: >3 FB Neck ROM: full    Dental  (+) Poor Dentition, Chipped   Pulmonary sleep apnea , neg COPD,    Pulmonary exam normal breath sounds clear to auscultation       Cardiovascular hypertension, Pt. on medications (-) CAD and (-) Past MI negative cardio ROS Normal cardiovascular exam Rhythm:regular Rate:Normal     Neuro/Psych PSYCHIATRIC DISORDERS Depression Bipolar Disorder  Neuromuscular disease negative neurological ROS     GI/Hepatic negative GI ROS, Neg liver ROS, GERD  Controlled,  Endo/Other  diabetes, Type 2, Oral Hypoglycemic Agents  Renal/GU negative Renal ROS  negative genitourinary   Musculoskeletal   Abdominal (+) + obese,   Peds  Hematology negative hematology ROS (+)   Anesthesia Other Findings Past Medical History: No date: Depression No date: Diabetes mellitus without complication ([place]) 2/95/18: Diabetic peripheral neuropathy ([place]) 03/07/15: Diabetic peripheral neuropathy ([place]) 03/07/15: Diabetic peripheral neuropathy ([facility]) No date: GERD (gastroesophageal reflux disease) 03/07/15: Hypercholesterolemia No date: Hypertension 03/07/15: Obesity 03/07/15: Personality disorder 03/07/15: Sinus tachycardia ([place])     Comment: history of 03/07/15: Suicidal thoughts Past Surgical History: 03/07/15: electroconvulsion therapy BMI    Body Mass Index:  36.44 kg/m     Reproductive/Obstetrics                             Anesthesia Physical  Anesthesia Plan  ASA: III  Anesthesia Plan: General   Post-op Pain Management:    Induction: Intravenous  Airway Management  Planned: Mask  Additional Equipment:   Intra-op Plan:   Post-operative Plan:   Informed Consent: I have reviewed the patients History and Physical, chart, labs and discussed the procedure including the risks, benefits and alternatives for the proposed anesthesia with the patient or authorized representative who has indicated his/her understanding and acceptance.   Dental Advisory Given  Plan Discussed with: CRNA and Anesthesiologist  Anesthesia Plan Comments: (Patient consented for risks of anesthesia including but not limited to:  - adverse reactions to medications - damage to teeth, lips or other oral mucosa - sore throat or hoarseness - Damage to heart, brain, lungs or loss of life  Patient voiced understanding.)        Anesthesia Quick Evaluation

## 2017-03-14 NOTE — Transfer of Care (Addendum)
Immediate Anesthesia Transfer of Care Note  Patient: Melinda [name]  Procedure(s) Performed: ECT  Patient Location: PACU  Anesthesia Type:General  Level of Consciousness: sedated  Airway & Oxygen Therapy: Patient Spontanous Breathing  Post-op Assessment: Report given to RN and Post -op Vital signs reviewed and stable  Post vital signs: Reviewed and stable  Last Vitals:  Vitals:   03/14/17 1052 03/14/17 1102  BP: (!) 160/75 (!) 145/79  Pulse: 91 91  Resp: 17 19  Temp:      Last Pain:  Vitals:   03/14/17 1102  TempSrc:   PainSc: 0-No pain         Complications: No apparent anesthesia complications

## 2017-03-14 NOTE — Anesthesia Procedure Notes (Signed)
Date/Time: 03/14/2017 10:24 AM Performed by: Dionne Bucy Pre-anesthesia Checklist: Patient identified, Emergency Drugs available, Suction available and Patient being monitored Patient Re-evaluated:Patient Re-evaluated prior to inductionOxygen Delivery Method: Circle system utilized Preoxygenation: Pre-oxygenation with 100% oxygen Intubation Type: IV induction Ventilation: Mask ventilation without difficulty and Mask ventilation throughout procedure Airway Equipment and Method: Bite block Placement Confirmation: positive ETCO2 Dental Injury: Teeth and Oropharynx as per pre-operative assessment

## 2017-03-14 NOTE — Anesthesia Post-op Follow-up Note (Cosign Needed)
Anesthesia QCDR form completed.        

## 2017-03-14 NOTE — Anesthesia Postprocedure Evaluation (Signed)
Anesthesia Post Note  Patient: [place] M Delo  Procedure(s) Performed: * No procedures listed *  Patient location during evaluation: PACU Anesthesia Type: General Level of consciousness: awake and alert Pain management: pain level controlled Vital Signs Assessment: post-procedure vital signs reviewed and stable Respiratory status: spontaneous breathing, nonlabored ventilation, respiratory function stable and patient connected to nasal cannula oxygen Cardiovascular status: blood pressure returned to baseline and stable Postop Assessment: no signs of nausea or vomiting Anesthetic complications: no     Last Vitals:  Vitals:   03/14/17 1102 03/14/17 1128  BP: (!) 145/79 (!) 145/82  Pulse: 91 92  Resp: 19 18  Temp:  36.8 C    Last Pain:  Vitals:   03/14/17 1128  TempSrc: Tympanic  PainSc:                  Precious Haws Sherwood Castilla

## 2017-03-14 NOTE — Procedures (Signed)
ECT SERVICES Physician's Interval Evaluation & Treatment Note  Patient Identification: Melinda [name] MRN:  [id number] Date of Evaluation:  03/14/2017 TX #: [id number]  MADRS:   MMSE:   P.E. Findings:  No change to physical exam vitals normal. Heart and lung normal.  Psychiatric Interval Note:  Mood slightly down about the same as last time  Subjective:  Patient is a 46 y.o. female seen for evaluation for Electroconvulsive Therapy. No new complaint  Treatment Summary:   []   Right Unilateral             [x]  Bilateral   % Energy : 1.0 ms 35%   Impedance: 1230 ohms  Seizure Energy Index: 6530 microvolts squared  Postictal Suppression Index: 66%  Seizure Concordance Index: 74%  Medications  Pre Shock: Robinul 0.4 mg Toradol 30 mg labetalol 20 mg esmolol 10 mg Brevital 70 mg succinylcholine 100 mg  Post Shock:    Seizure Duration: 41 seconds by EMG 79 seconds by EEG   Comments: Follow-up in 1 week   Lungs:  [x]   Clear to auscultation               []  Other:   Heart:    [x]   Regular rhythm             []  irregular rhythm    [x]   Previous H&P reviewed, patient examined and there are NO CHANGES                 []   Previous H&P reviewed, patient examined and there are changes noted.   [name], MD 5/4/201810:17 AM

## 2017-03-20 ENCOUNTER — Telehealth (HOSPITAL_COMMUNITY): Payer: Self-pay | Admitting: *Deleted

## 2017-03-20 ENCOUNTER — Other Ambulatory Visit: Payer: Self-pay | Admitting: Psychiatry

## 2017-03-20 NOTE — Telephone Encounter (Signed)
Called [phone] spoke with Beverlee Nims for continuous authorization of ECT maintenance. Was given 12 more sessions from 03/21/17-06/06/17. Josem Kaufmann #[phone].

## 2017-03-21 ENCOUNTER — Encounter
Admission: RE | Admit: 2017-03-21 | Discharge: 2017-03-21 | Disposition: A | Discharge status: Home / Self Care | Payer: Medicare PPO | Source: Ambulatory Visit | Attending: Psychiatry | Admitting: Psychiatry

## 2017-03-21 ENCOUNTER — Encounter: Payer: Self-pay | Admitting: Anesthesiology

## 2017-03-21 DIAGNOSIS — F332 Major depressive disorder, recurrent severe without psychotic features: Secondary | ICD-10-CM | POA: Diagnosis not present

## 2017-03-21 DIAGNOSIS — F329 Major depressive disorder, single episode, unspecified: Secondary | ICD-10-CM | POA: Insufficient documentation

## 2017-03-21 DIAGNOSIS — E669 Obesity, unspecified: Secondary | ICD-10-CM | POA: Insufficient documentation

## 2017-03-21 DIAGNOSIS — F3189 Other bipolar disorder: Secondary | ICD-10-CM | POA: Diagnosis not present

## 2017-03-21 DIAGNOSIS — K219 Gastro-esophageal reflux disease without esophagitis: Secondary | ICD-10-CM | POA: Diagnosis not present

## 2017-03-21 DIAGNOSIS — I10 Essential (primary) hypertension: Secondary | ICD-10-CM | POA: Diagnosis not present

## 2017-03-21 DIAGNOSIS — E119 Type 2 diabetes mellitus without complications: Secondary | ICD-10-CM | POA: Diagnosis not present

## 2017-03-21 DIAGNOSIS — E7800 Pure hypercholesterolemia, unspecified: Secondary | ICD-10-CM | POA: Diagnosis not present

## 2017-03-21 DIAGNOSIS — E1142 Type 2 diabetes mellitus with diabetic polyneuropathy: Secondary | ICD-10-CM | POA: Diagnosis not present

## 2017-03-21 DIAGNOSIS — F339 Major depressive disorder, recurrent, unspecified: Secondary | ICD-10-CM | POA: Diagnosis not present

## 2017-03-21 LAB — GLUCOSE, CAPILLARY: Glucose-Capillary: 110 mg/dL — ABNORMAL HIGH (ref 65–99)

## 2017-03-21 LAB — POCT PREGNANCY, URINE: Preg Test, Ur: NEGATIVE

## 2017-03-21 MED ORDER — METHOHEXITAL SODIUM 0.5 G IJ SOLR
INTRAMUSCULAR | Status: AC
  Filled 2017-03-21: qty 500

## 2017-03-21 MED ORDER — METHOHEXITAL SODIUM 100 MG/10ML IV SOSY
PREFILLED_SYRINGE | INTRAVENOUS | Status: DC | PRN
  Administered 2017-03-21: 70 mg via INTRAVENOUS

## 2017-03-21 MED ORDER — KETOROLAC TROMETHAMINE 30 MG/ML IJ SOLN
INTRAMUSCULAR | Status: AC
  Administered 2017-03-21: 30 mg via INTRAVENOUS
  Filled 2017-03-21: qty 1

## 2017-03-21 MED ORDER — ESMOLOL HCL 100 MG/10ML IV SOLN
INTRAVENOUS | Status: DC | PRN
  Administered 2017-03-21: 20 mg via INTRAVENOUS

## 2017-03-21 MED ORDER — KETOROLAC TROMETHAMINE 30 MG/ML IJ SOLN
30.0000 mg | Freq: Once | INTRAMUSCULAR | Status: AC
  Administered 2017-03-21: 30 mg via INTRAVENOUS

## 2017-03-21 MED ORDER — ESMOLOL HCL 100 MG/10ML IV SOLN
INTRAVENOUS | Status: AC
  Filled 2017-03-21: qty 10

## 2017-03-21 MED ORDER — SUCCINYLCHOLINE CHLORIDE 200 MG/10ML IV SOSY
PREFILLED_SYRINGE | INTRAVENOUS | Status: DC | PRN
  Administered 2017-03-21: 100 mg via INTRAVENOUS

## 2017-03-21 MED ORDER — LABETALOL HCL 5 MG/ML IV SOLN
INTRAVENOUS | Status: AC
  Filled 2017-03-21: qty 4

## 2017-03-21 MED ORDER — SODIUM CHLORIDE 0.9 % IV SOLN
INTRAVENOUS | Status: DC | PRN
  Administered 2017-03-21: 10:00:00 via INTRAVENOUS

## 2017-03-21 MED ORDER — GLYCOPYRROLATE 0.2 MG/ML IJ SOLN
INTRAMUSCULAR | Status: AC
  Filled 2017-03-21: qty 2

## 2017-03-21 MED ORDER — GLYCOPYRROLATE 0.2 MG/ML IJ SOLN
0.4000 mg | Freq: Once | INTRAMUSCULAR | Status: AC
  Administered 2017-03-21: 0.4 mg via INTRAVENOUS

## 2017-03-21 MED ORDER — SODIUM CHLORIDE 0.9 % IV SOLN
500.0000 mL | Freq: Once | INTRAVENOUS | Status: AC
  Administered 2017-03-21: 1000 mL via INTRAVENOUS

## 2017-03-21 MED ORDER — SUCCINYLCHOLINE CHLORIDE 20 MG/ML IJ SOLN
INTRAMUSCULAR | Status: AC
  Filled 2017-03-21: qty 1

## 2017-03-21 MED ORDER — LABETALOL HCL 5 MG/ML IV SOLN
INTRAVENOUS | Status: DC | PRN
  Administered 2017-03-21: 20 mg via INTRAVENOUS

## 2017-03-21 NOTE — Procedures (Signed)
ECT SERVICES Physician's Interval Evaluation & Treatment Note  Patient Identification: Melinda [name] MRN:  [id number] Date of Evaluation:  03/21/2017 TX #: [id number]  MADRS: 19  MMSE: 29  P.E. Findings:  Vital stable. Heart and lungs normal. No change to physical exam.  Psychiatric Interval Note:  Mood stable no active suicidal ideation behavior calm.  Subjective:  Patient is a 46 y.o. female seen for evaluation for Electroconvulsive Therapy. No specific complaint  Treatment Summary:   []   Right Unilateral             [x]  Bilateral   % Energy : 1.0 ms 35%   Impedance: 1950 ohms  Seizure Energy Index: 3751 V squared  Postictal Suppression Index: 87%  Seizure Concordance Index: 95%  Medications  Pre Shock: Robinul 0.4 mg Toradol 30 mg labetalol 20 mg esmolol 10 mg Brevital 70 mg succinylcholine 100 mg  Post Shock:    Seizure Duration: 35 seconds by EMG 72 seconds by EEG   Comments: Follow-up one week   Lungs:  [x]   Clear to auscultation               []  Other:   Heart:    [x]   Regular rhythm             []  irregular rhythm    [x]   Previous H&P reviewed, patient examined and there are NO CHANGES                 []   Previous H&P reviewed, patient examined and there are changes noted.   [name], MD 5/11/201810:27 AM

## 2017-03-21 NOTE — H&P (Signed)
Melinda [name] is an 46 y.o. female.   Chief Complaint: " I'm okay" HPI: Patient with recurrent severe depression who is maintained on weekly maintenance ECT with good results  Past Medical History:  Diagnosis Date  . Depression   . Diabetes mellitus without complication ([place])   . Diabetic peripheral neuropathy ([place]) 03/07/15  . Diabetic peripheral neuropathy ([place]) 03/07/15  . Diabetic peripheral neuropathy ([place]) 03/07/15  . GERD (gastroesophageal reflux disease)   . Hypercholesterolemia 03/07/15  . Hypertension   . Obesity 03/07/15  . Personality disorder 03/07/15  . Sinus tachycardia 03/07/15   history of  . Suicidal thoughts 03/07/15    Past Surgical History:  Procedure Laterality Date  . electroconvulsion therapy  03/07/15    Family History  Problem Relation Age of Onset  . Hypertension Father   . Diabetes Mother    Social History:  reports that she has never smoked. She has never used smokeless tobacco. She reports that she does not drink alcohol or use drugs.  Allergies:  Allergies  Allergen Reactions  . Prednisone     Increases blood sugar     (Not in a hospital admission)  Results for orders placed or performed during the hospital encounter of 03/21/17 (from the past 48 hour(s))  Glucose, capillary     Status: Abnormal   Collection Time: 03/21/17  8:24 AM  Result Value Ref Range   Glucose-Capillary 110 (H) 65 - 99 mg/dL   Comment 1 Notify RN   Pregnancy, urine POC     Status: None   Collection Time: 03/21/17  8:26 AM  Result Value Ref Range   Preg Test, Ur NEGATIVE NEGATIVE    Comment:        THE SENSITIVITY OF THIS METHODOLOGY IS >24 mIU/mL    No results found.  Review of Systems  Constitutional: Negative.   HENT: Negative.   Eyes: Negative.   Respiratory: Negative.   Cardiovascular: Negative.   Gastrointestinal: Negative.   Musculoskeletal: Negative.   Skin: Negative.   Neurological: Negative.   Psychiatric/Behavioral: Negative.     Blood  pressure (!) 155/87, pulse 95, temperature 98.3 F (36.8 C), resp. rate 18, height 5\' 5"  (1.651 m), weight 107.5 kg (237 lb), SpO2 99 %. Physical Exam  Nursing note and vitals reviewed. Constitutional: She appears well-developed and well-nourished.  HENT:  Head: Normocephalic and atraumatic.  Eyes: Conjunctivae are normal. Pupils are equal, round, and reactive to light.  Neck: Normal range of motion.  Cardiovascular: Regular rhythm and normal heart sounds.   Respiratory: Effort normal and breath sounds normal. No respiratory distress.  GI: Soft.  Musculoskeletal: Normal range of motion.  Neurological: She is alert.  Skin: Skin is warm and dry.  Psychiatric: She has a normal mood and affect. Her behavior is normal. Judgment and thought content normal.     Assessment/Plan Doing well stable no new complaints. We are having treatment today and will follow-up in 1 week.  [name], MD 03/21/2017, 10:25 AM

## 2017-03-21 NOTE — Anesthesia Postprocedure Evaluation (Signed)
Anesthesia Post Note  Patient: Melinda [name]  Procedure(s) Performed: * No procedures listed *  Patient location during evaluation: PACU Anesthesia Type: General Level of consciousness: awake and alert Pain management: pain level controlled Vital Signs Assessment: post-procedure vital signs reviewed and stable Respiratory status: spontaneous breathing, nonlabored ventilation, respiratory function stable and patient connected to nasal cannula oxygen Cardiovascular status: blood pressure returned to baseline and stable Postop Assessment: no signs of nausea or vomiting Anesthetic complications: no     Last Vitals:  Vitals:   03/21/17 1113 03/21/17 1129  BP: (!) 146/76 140/81  Pulse: 78 74  Resp: 20 16  Temp:  36.9 C    Last Pain:  Vitals:   03/21/17 1129  TempSrc: Oral  PainSc:                  Martha Clan

## 2017-03-21 NOTE — Anesthesia Post-op Follow-up Note (Cosign Needed)
Anesthesia QCDR form completed.        

## 2017-03-21 NOTE — Discharge Instructions (Signed)
1)  The drugs that you have been given will stay in your system until tomorrow so for the       next 24 hours you should not:  A. Drive an automobile  B. Make any legal decisions  C. Drink any alcoholic beverages  2)  You may resume your regular meals upon return home.  3)  A responsible adult must take you home.  Someone should stay with you for a few          hours, then be available by phone for the remainder of the treatment day.  4)  You May experience any of the following symptoms:  Headache, Nausea and a dry mouth (due to the medications you were given),  temporary memory loss and some confusion, or sore muscles (a warm bath  should help this).  If you you experience any of these symptoms let us know on                your return visit.  5)  Report any of the following: any acute discomfort, severe headache, or temperature        greater than 100.5 F.   Also report any unusual redness, swelling, drainage, or pain         at your IV site.    You may report Symptoms to:  [place] at [facility]          Phone: [phone], ECT Department           or Dr. Prescott Gum office [phone]  6)  Your next ECT Treatment is Day friday  Date May 18 at 800am  We will call 2 days prior to your scheduled appointment for arrival times.  7)  Nothing to eat or drink after midnight the night before your procedure.  8)  Take .  With a sip of water the morning of your procedure.  9)  Other Instructions: Call [phone] to cancel the morning of your procedure due         to illness or emergency.  10) We will call within 72 hours to assess how you are feeling.

## 2017-03-21 NOTE — Anesthesia Preprocedure Evaluation (Signed)
Anesthesia Evaluation  Patient identified by MRN, date of birth, ID band Patient awake    Reviewed: Allergy & Precautions, H&P , NPO status , Patient's Chart, lab work & pertinent test results  History of Anesthesia Complications Negative for: history of anesthetic complications  Airway Mallampati: II  TM Distance: >3 FB Neck ROM: full    Dental  (+) Poor Dentition, Chipped   Pulmonary sleep apnea , neg COPD,    Pulmonary exam normal breath sounds clear to auscultation       Cardiovascular hypertension, Pt. on medications (-) CAD and (-) Past MI negative cardio ROS Normal cardiovascular exam Rhythm:regular Rate:Normal     Neuro/Psych PSYCHIATRIC DISORDERS Depression Bipolar Disorder  Neuromuscular disease negative neurological ROS     GI/Hepatic negative GI ROS, Neg liver ROS, GERD  Controlled,  Endo/Other  diabetes, Type 2, Oral Hypoglycemic Agents  Renal/GU negative Renal ROS  negative genitourinary   Musculoskeletal   Abdominal (+) + obese,   Peds  Hematology negative hematology ROS (+)   Anesthesia Other Findings Past Medical History: No date: Depression No date: Diabetes mellitus without complication ([place]) 4/62/70: Diabetic peripheral neuropathy ([place]) 03/07/15: Diabetic peripheral neuropathy ([place]) 03/07/15: Diabetic peripheral neuropathy ([facility]) No date: GERD (gastroesophageal reflux disease) 03/07/15: Hypercholesterolemia No date: Hypertension 03/07/15: Obesity 03/07/15: Personality disorder 03/07/15: Sinus tachycardia ([place])     Comment: history of 03/07/15: Suicidal thoughts Past Surgical History: 03/07/15: electroconvulsion therapy BMI    Body Mass Index:  36.44 kg/m     Reproductive/Obstetrics                             Anesthesia Physical  Anesthesia Plan  ASA: III  Anesthesia Plan: General   Post-op Pain Management:    Induction: Intravenous  Airway Management  Planned: Mask  Additional Equipment:   Intra-op Plan:   Post-operative Plan:   Informed Consent: I have reviewed the patients History and Physical, chart, labs and discussed the procedure including the risks, benefits and alternatives for the proposed anesthesia with the patient or authorized representative who has indicated his/her understanding and acceptance.   Dental Advisory Given  Plan Discussed with: CRNA and Anesthesiologist  Anesthesia Plan Comments: (Patient consented for risks of anesthesia including but not limited to:  - adverse reactions to medications - damage to teeth, lips or other oral mucosa - sore throat or hoarseness - Damage to heart, brain, lungs or loss of life  Patient voiced understanding.)        Anesthesia Quick Evaluation

## 2017-03-21 NOTE — Transfer of Care (Addendum)
Immediate Anesthesia Transfer of Care Note  Patient: Melinda [name]  Procedure(s) Performed: ECT  Patient Location: PACU  Anesthesia Type:General  Level of Consciousness: sedated  Airway & Oxygen Therapy: Patient Spontanous Breathing  Post-op Assessment: Report given to RN and Post -op Vital signs reviewed and stable  Post vital signs: Reviewed and stable  Last Vitals:  Vitals:   03/21/17 1043 03/21/17 1053  BP: (!) 115/96 (!) 99/53  Pulse: 94 91  Resp: 19 18  Temp: 37.3 C     Last Pain:  Vitals:   03/21/17 1053  TempSrc:   PainSc: Asleep         Complications: No apparent anesthesia complications

## 2017-03-28 ENCOUNTER — Encounter: Payer: Self-pay | Admitting: Anesthesiology

## 2017-03-28 ENCOUNTER — Other Ambulatory Visit: Payer: Self-pay | Admitting: Psychiatry

## 2017-03-28 ENCOUNTER — Ambulatory Visit
Admission: RE | Admit: 2017-03-28 | Discharge: 2017-03-28 | Disposition: A | Discharge status: Home / Self Care | Payer: Medicare PPO | Source: Ambulatory Visit | Attending: Psychiatry | Admitting: Psychiatry

## 2017-03-28 DIAGNOSIS — I10 Essential (primary) hypertension: Secondary | ICD-10-CM | POA: Insufficient documentation

## 2017-03-28 DIAGNOSIS — E1142 Type 2 diabetes mellitus with diabetic polyneuropathy: Secondary | ICD-10-CM | POA: Insufficient documentation

## 2017-03-28 DIAGNOSIS — E119 Type 2 diabetes mellitus without complications: Secondary | ICD-10-CM | POA: Diagnosis not present

## 2017-03-28 DIAGNOSIS — K219 Gastro-esophageal reflux disease without esophagitis: Secondary | ICD-10-CM | POA: Insufficient documentation

## 2017-03-28 DIAGNOSIS — Z833 Family history of diabetes mellitus: Secondary | ICD-10-CM | POA: Diagnosis not present

## 2017-03-28 DIAGNOSIS — F339 Major depressive disorder, recurrent, unspecified: Secondary | ICD-10-CM | POA: Diagnosis not present

## 2017-03-28 DIAGNOSIS — F332 Major depressive disorder, recurrent severe without psychotic features: Secondary | ICD-10-CM | POA: Diagnosis not present

## 2017-03-28 DIAGNOSIS — E7800 Pure hypercholesterolemia, unspecified: Secondary | ICD-10-CM | POA: Diagnosis not present

## 2017-03-28 DIAGNOSIS — F3189 Other bipolar disorder: Secondary | ICD-10-CM | POA: Diagnosis not present

## 2017-03-28 LAB — POCT PREGNANCY, URINE: Preg Test, Ur: NEGATIVE

## 2017-03-28 LAB — GLUCOSE, CAPILLARY: Glucose-Capillary: 91 mg/dL (ref 65–99)

## 2017-03-28 MED ORDER — ESMOLOL HCL 100 MG/10ML IV SOLN
INTRAVENOUS | Status: DC | PRN
  Administered 2017-03-28: 20 mg via INTRAVENOUS

## 2017-03-28 MED ORDER — SUCCINYLCHOLINE CHLORIDE 20 MG/ML IJ SOLN
INTRAMUSCULAR | Status: AC
  Filled 2017-03-28: qty 1

## 2017-03-28 MED ORDER — ESMOLOL HCL 100 MG/10ML IV SOLN
INTRAVENOUS | Status: AC
  Filled 2017-03-28: qty 10

## 2017-03-28 MED ORDER — KETOROLAC TROMETHAMINE 30 MG/ML IJ SOLN
INTRAMUSCULAR | Status: AC
  Administered 2017-03-28: 30 mg via INTRAVENOUS
  Filled 2017-03-28: qty 1

## 2017-03-28 MED ORDER — SODIUM CHLORIDE 0.9 % IV SOLN
500.0000 mL | Freq: Once | INTRAVENOUS | Status: AC
  Administered 2017-03-28: 1000 mL via INTRAVENOUS

## 2017-03-28 MED ORDER — ONDANSETRON HCL 4 MG/2ML IJ SOLN: 4.0000 mg | Freq: Once | INTRAMUSCULAR | Status: DC | PRN

## 2017-03-28 MED ORDER — METHOHEXITAL SODIUM 100 MG/10ML IV SOSY
PREFILLED_SYRINGE | INTRAVENOUS | Status: DC | PRN
Start: 2017-03-28 — End: 2017-03-28
  Administered 2017-03-28: 70 mg via INTRAVENOUS

## 2017-03-28 MED ORDER — LABETALOL HCL 5 MG/ML IV SOLN
INTRAVENOUS | Status: DC | PRN
  Administered 2017-03-28: 20 mg via INTRAVENOUS

## 2017-03-28 MED ORDER — SODIUM CHLORIDE 0.9 % IV SOLN
INTRAVENOUS | Status: DC | PRN
  Administered 2017-03-28: 10:00:00 via INTRAVENOUS

## 2017-03-28 MED ORDER — METHOHEXITAL SODIUM 0.5 G IJ SOLR
INTRAMUSCULAR | Status: AC
  Filled 2017-03-28: qty 500

## 2017-03-28 MED ORDER — GLYCOPYRROLATE 0.2 MG/ML IJ SOLN
INTRAMUSCULAR | Status: AC
  Administered 2017-03-28: 0.4 mg via INTRAVENOUS
  Filled 2017-03-28: qty 2

## 2017-03-28 MED ORDER — LABETALOL HCL 5 MG/ML IV SOLN
INTRAVENOUS | Status: AC
  Filled 2017-03-28: qty 4

## 2017-03-28 MED ORDER — GLYCOPYRROLATE 0.2 MG/ML IJ SOLN
0.4000 mg | Freq: Once | INTRAMUSCULAR | Status: AC
  Administered 2017-03-28: 0.4 mg via INTRAVENOUS

## 2017-03-28 MED ORDER — FENTANYL CITRATE (PF) 100 MCG/2ML IJ SOLN: 25.0000 ug | INTRAMUSCULAR | Status: DC | PRN

## 2017-03-28 MED ORDER — KETOROLAC TROMETHAMINE 30 MG/ML IJ SOLN
30.0000 mg | Freq: Once | INTRAMUSCULAR | Status: AC
  Administered 2017-03-28: 30 mg via INTRAVENOUS

## 2017-03-28 MED ORDER — SUCCINYLCHOLINE CHLORIDE 200 MG/10ML IV SOSY
PREFILLED_SYRINGE | INTRAVENOUS | Status: DC | PRN
  Administered 2017-03-28: 100 mg via INTRAVENOUS

## 2017-03-28 NOTE — Anesthesia Post-op Follow-up Note (Cosign Needed)
Anesthesia QCDR form completed.        

## 2017-03-28 NOTE — Transfer of Care (Addendum)
Immediate Anesthesia Transfer of Care Note  Patient: Melinda [name]  Procedure(s) Performed: ECT  Patient Location: PACU  Anesthesia Type:General  Level of Consciousness: sedated  Airway & Oxygen Therapy: Patient Spontanous Breathing and Patient connected to face mask oxygen  Post-op Assessment: Report given to RN and Post -op Vital signs reviewed and stable  Post vital signs: Reviewed and stable  Last Vitals:  Vitals:   03/28/17 1049 03/28/17 1059  BP: 129/79 (!) 142/63  Pulse: 93 90  Resp: 15 20  Temp:      Last Pain:  Vitals:   03/28/17 1059  TempSrc:   PainSc: 0-No pain         Complications: No apparent anesthesia complications

## 2017-03-28 NOTE — Discharge Instructions (Signed)
1)  The drugs that you have been given will stay in your system until tomorrow so for the       next 24 hours you should not:  A. Drive an automobile  B. Make any legal decisions  C. Drink any alcoholic beverages  2)  You may resume your regular meals upon return home.  3)  A responsible adult must take you home.  Someone should stay with you for a few          hours, then be available by phone for the remainder of the treatment day.  4)  You May experience any of the following symptoms:  Headache, Nausea and a dry mouth (due to the medications you were given),  temporary memory loss and some confusion, or sore muscles (a warm bath  should help this).  If you you experience any of these symptoms let us know on                your return visit.  5)  Report any of the following: any acute discomfort, severe headache, or temperature        greater than 100.5 F.   Also report any unusual redness, swelling, drainage, or pain         at your IV site.    You may report Symptoms to:  [place] at [facility]          Phone: [phone], ECT Department           or Dr. Prescott Gum office [phone]  6)  Your next ECT Treatment is Friday Apr 04, 2017  We will call 2 days prior to your scheduled appointment for arrival times.  7)  Nothing to eat or drink after midnight the night before your procedure.  8)  Take     With a sip of water the morning of your procedure.  9)  Other Instructions: Call [phone] to cancel the morning of your procedure due         to illness or emergency.  10) We will call within 72 hours to assess how you are feeling.

## 2017-03-28 NOTE — Anesthesia Procedure Notes (Signed)
Date/Time: 03/28/2017 10:27 AM Performed by: Dionne Bucy Pre-anesthesia Checklist: Patient identified, Emergency Drugs available, Suction available and Patient being monitored Patient Re-evaluated:Patient Re-evaluated prior to inductionOxygen Delivery Method: Circle system utilized Preoxygenation: Pre-oxygenation with 100% oxygen Intubation Type: IV induction Ventilation: Mask ventilation without difficulty and Mask ventilation throughout procedure Airway Equipment and Method: Bite block Placement Confirmation: positive ETCO2 Dental Injury: Teeth and Oropharynx as per pre-operative assessment

## 2017-03-28 NOTE — Anesthesia Preprocedure Evaluation (Signed)
Anesthesia Evaluation  Patient identified by MRN, date of birth, ID band Patient awake    Reviewed: Allergy & Precautions, H&P , NPO status , Patient's Chart, lab work & pertinent test results  History of Anesthesia Complications Negative for: history of anesthetic complications  Airway Mallampati: II  TM Distance: >3 FB Neck ROM: full    Dental  (+) Poor Dentition, Chipped   Pulmonary sleep apnea , neg COPD,    Pulmonary exam normal breath sounds clear to auscultation       Cardiovascular hypertension, Pt. on medications (-) CAD and (-) Past MI negative cardio ROS Normal cardiovascular exam Rhythm:regular Rate:Normal     Neuro/Psych PSYCHIATRIC DISORDERS Depression Bipolar Disorder  Neuromuscular disease negative neurological ROS     GI/Hepatic negative GI ROS, Neg liver ROS, GERD  Controlled,  Endo/Other  diabetes, Type 2, Oral Hypoglycemic Agents  Renal/GU negative Renal ROS  negative genitourinary   Musculoskeletal   Abdominal (+) + obese,   Peds  Hematology negative hematology ROS (+)   Anesthesia Other Findings Past Medical History: No date: Depression No date: Diabetes mellitus without complication ([place]) 07/26/04: Diabetic peripheral neuropathy ([place]) 03/07/15: Diabetic peripheral neuropathy ([place]) 03/07/15: Diabetic peripheral neuropathy ([facility]) No date: GERD (gastroesophageal reflux disease) 03/07/15: Hypercholesterolemia No date: Hypertension 03/07/15: Obesity 03/07/15: Personality disorder 03/07/15: Sinus tachycardia ([place])     Comment: history of 03/07/15: Suicidal thoughts Past Surgical History: 03/07/15: electroconvulsion therapy BMI    Body Mass Index:  36.44 kg/m     Reproductive/Obstetrics                             Anesthesia Physical  Anesthesia Plan  ASA: III  Anesthesia Plan: General   Post-op Pain Management:    Induction: Intravenous  Airway Management  Planned: Mask  Additional Equipment:   Intra-op Plan:   Post-operative Plan:   Informed Consent: I have reviewed the patients History and Physical, chart, labs and discussed the procedure including the risks, benefits and alternatives for the proposed anesthesia with the patient or authorized representative who has indicated his/her understanding and acceptance.   Dental Advisory Given  Plan Discussed with: CRNA and Anesthesiologist  Anesthesia Plan Comments: (Patient consented for risks of anesthesia including but not limited to:  - adverse reactions to medications - damage to teeth, lips or other oral mucosa - sore throat or hoarseness - Damage to heart, brain, lungs or loss of life  Patient voiced understanding.)        Anesthesia Quick Evaluation

## 2017-03-28 NOTE — Anesthesia Postprocedure Evaluation (Signed)
Anesthesia Post Note  Patient: Melinda [name]  Procedure(s) Performed: * No procedures listed *  Patient location during evaluation: PACU Anesthesia Type: General Level of consciousness: awake and alert and oriented Pain management: pain level controlled Vital Signs Assessment: post-procedure vital signs reviewed and stable Respiratory status: spontaneous breathing Cardiovascular status: blood pressure returned to baseline Anesthetic complications: no     Last Vitals:  Vitals:   03/28/17 1109 03/28/17 1127  BP: (!) 141/67 (!) 146/91  Pulse: 86 84  Resp: 19 17  Temp:      Last Pain:  Vitals:   03/28/17 1127  TempSrc:   PainSc: 3                  Evania Lyne

## 2017-03-28 NOTE — Procedures (Signed)
ECT SERVICES Physician's Interval Evaluation & Treatment Note  Patient Identification: Melinda [name] MRN:  [id number] Date of Evaluation:  03/28/2017 TX #: [id number]  MADRS:   MMSE:   P.E. Findings:  No change to physical exam. Vitals are unremarkable heart and lungs normal.  Psychiatric Interval Note:  Mood and affect down and sad  Subjective:  Patient is a 46 y.o. female seen for evaluation for Electroconvulsive Therapy. Feeling more sad and down and hopeless.  Treatment Summary:   []   Right Unilateral             [x]  Bilateral   % Energy : 1.0 ms 35%   Impedance: 1400 ohms  Seizure Energy Index: 9054 V squared  Postictal Suppression Index: 45%  Seizure Concordance Index: 96%  Medications  Pre Shock: Robinul 0.4 mg labetalol 20 mg esmolol 10 mg Toradol 30 mg Brevital 70 mg succinylcholine 100 mg  Post Shock:    Seizure Duration: 40 seconds by EMG 83 seconds by EEG   Comments: Follow-up in a week   Lungs:  [x]   Clear to auscultation               []  Other:   Heart:    [x]   Regular rhythm             []  irregular rhythm    [x]   Previous H&P reviewed, patient examined and there are NO CHANGES                 []   Previous H&P reviewed, patient examined and there are changes noted.   [name], MD 5/18/201810:21 AM

## 2017-03-28 NOTE — H&P (Signed)
Melinda [name] is an 46 y.o. female.   Chief Complaint: Patient is still feeling bad. Mood is more down. Having some significant life and social problems. Passive suicidal ideation but no intent or plan. No psychosis. Not sleeping very well. HPI: History of recurrent severe depression continues to feel that she gets some persistent benefit from the weekly ECT  Past Medical History:  Diagnosis Date  . Depression   . Diabetes mellitus without complication ([place])   . Diabetic peripheral neuropathy ([place]) 03/07/15  . Diabetic peripheral neuropathy ([place]) 03/07/15  . Diabetic peripheral neuropathy ([place]) 03/07/15  . GERD (gastroesophageal reflux disease)   . Hypercholesterolemia 03/07/15  . Hypertension   . Obesity 03/07/15  . Personality disorder 03/07/15  . Sinus tachycardia 03/07/15   history of  . Suicidal thoughts 03/07/15    Past Surgical History:  Procedure Laterality Date  . electroconvulsion therapy  03/07/15    Family History  Problem Relation Age of Onset  . Hypertension Father   . Diabetes Mother    Social History:  reports that she has never smoked. She has never used smokeless tobacco. She reports that she does not drink alcohol or use drugs.  Allergies:  Allergies  Allergen Reactions  . Prednisone     Increases blood sugar     (Not in a hospital admission)  Results for orders placed or performed during the hospital encounter of 03/28/17 (from the past 48 hour(s))  Glucose, capillary     Status: None   Collection Time: 03/28/17  8:49 AM  Result Value Ref Range   Glucose-Capillary 91 65 - 99 mg/dL  Pregnancy, urine POC     Status: None   Collection Time: 03/28/17  8:53 AM  Result Value Ref Range   Preg Test, Ur NEGATIVE NEGATIVE    Comment:        THE SENSITIVITY OF THIS METHODOLOGY IS >24 mIU/mL    No results found.  Review of Systems  Constitutional: Negative.   HENT: Negative.   Eyes: Negative.   Respiratory: Negative.   Cardiovascular: Negative.    Gastrointestinal: Negative.   Musculoskeletal: Negative.   Skin: Negative.   Neurological: Negative.   Psychiatric/Behavioral: Positive for depression. Negative for hallucinations, memory loss, substance abuse and suicidal ideas. The patient is nervous/anxious and has insomnia.     Blood pressure (!) 150/87, pulse (!) 103, temperature 97.6 F (36.4 C), temperature source Oral, resp. rate 16, height 5\' 5"  (1.651 m), weight 107 kg (236 lb), last menstrual period 03/26/2017, SpO2 100 %. Physical Exam  Nursing note and vitals reviewed. Constitutional: She appears well-developed and well-nourished.  HENT:  Head: Normocephalic and atraumatic.  Eyes: Conjunctivae are normal. Pupils are equal, round, and reactive to light.  Neck: Normal range of motion.  Cardiovascular: Regular rhythm and normal heart sounds.   Respiratory: Effort normal. No respiratory distress.  GI: Soft.  Musculoskeletal: Normal range of motion.  Neurological: She is alert.  Skin: Skin is warm and dry.  Psychiatric: Judgment normal. Her affect is blunt. Her speech is delayed. She is slowed. Cognition and memory are normal. She expresses suicidal ideation. She expresses no suicidal plans.     Assessment/Plan Spent some time talking with her today trying to do some counseling and encouragement. She still feels that the treatment has been helpful. We discussed options of continuing but she prefers to come back next week as usual.  [name], MD 03/28/2017, 10:17 AM

## 2017-04-03 ENCOUNTER — Other Ambulatory Visit: Payer: Self-pay | Admitting: Psychiatry

## 2017-04-04 ENCOUNTER — Ambulatory Visit
Admission: RE | Admit: 2017-04-04 | Discharge: 2017-04-04 | Disposition: A | Discharge status: Home / Self Care | Payer: Medicare PPO | Source: Ambulatory Visit | Attending: Psychiatry | Admitting: Psychiatry

## 2017-04-04 ENCOUNTER — Encounter: Payer: Self-pay | Admitting: Anesthesiology

## 2017-04-04 DIAGNOSIS — I10 Essential (primary) hypertension: Secondary | ICD-10-CM | POA: Diagnosis not present

## 2017-04-04 DIAGNOSIS — E669 Obesity, unspecified: Secondary | ICD-10-CM | POA: Insufficient documentation

## 2017-04-04 DIAGNOSIS — K219 Gastro-esophageal reflux disease without esophagitis: Secondary | ICD-10-CM | POA: Diagnosis not present

## 2017-04-04 DIAGNOSIS — F339 Major depressive disorder, recurrent, unspecified: Secondary | ICD-10-CM | POA: Diagnosis not present

## 2017-04-04 DIAGNOSIS — F332 Major depressive disorder, recurrent severe without psychotic features: Secondary | ICD-10-CM | POA: Insufficient documentation

## 2017-04-04 DIAGNOSIS — E7800 Pure hypercholesterolemia, unspecified: Secondary | ICD-10-CM | POA: Diagnosis not present

## 2017-04-04 DIAGNOSIS — F3189 Other bipolar disorder: Secondary | ICD-10-CM | POA: Diagnosis not present

## 2017-04-04 DIAGNOSIS — E119 Type 2 diabetes mellitus without complications: Secondary | ICD-10-CM | POA: Diagnosis not present

## 2017-04-04 DIAGNOSIS — E1142 Type 2 diabetes mellitus with diabetic polyneuropathy: Secondary | ICD-10-CM | POA: Insufficient documentation

## 2017-04-04 MED ORDER — METHOHEXITAL SODIUM 100 MG/10ML IV SOSY
PREFILLED_SYRINGE | INTRAVENOUS | Status: DC | PRN
  Administered 2017-04-04: 70 mg via INTRAVENOUS

## 2017-04-04 MED ORDER — KETOROLAC TROMETHAMINE 30 MG/ML IJ SOLN
INTRAMUSCULAR | Status: AC
  Administered 2017-04-04: 30 mg via INTRAVENOUS
  Filled 2017-04-04: qty 1

## 2017-04-04 MED ORDER — LABETALOL HCL 5 MG/ML IV SOLN
INTRAVENOUS | Status: DC | PRN
  Administered 2017-04-04: 20 mg via INTRAVENOUS

## 2017-04-04 MED ORDER — ESMOLOL HCL 100 MG/10ML IV SOLN
INTRAVENOUS | Status: AC
  Filled 2017-04-04: qty 10

## 2017-04-04 MED ORDER — KETOROLAC TROMETHAMINE 30 MG/ML IJ SOLN
30.0000 mg | Freq: Once | INTRAMUSCULAR | Status: AC
  Administered 2017-04-04: 30 mg via INTRAVENOUS

## 2017-04-04 MED ORDER — GLYCOPYRROLATE 0.2 MG/ML IJ SOLN
0.4000 mg | Freq: Once | INTRAMUSCULAR | Status: AC
  Administered 2017-04-04: 0.4 mg via INTRAVENOUS

## 2017-04-04 MED ORDER — ESMOLOL HCL 100 MG/10ML IV SOLN
INTRAVENOUS | Status: DC | PRN
  Administered 2017-04-04: 20 mg via INTRAVENOUS

## 2017-04-04 MED ORDER — SUCCINYLCHOLINE CHLORIDE 20 MG/ML IJ SOLN
INTRAMUSCULAR | Status: DC | PRN
  Administered 2017-04-04: 100 mg via INTRAVENOUS

## 2017-04-04 MED ORDER — SODIUM CHLORIDE 0.9 % IV SOLN
500.0000 mL | Freq: Once | INTRAVENOUS | Status: AC
  Administered 2017-04-04: 10:00:00 via INTRAVENOUS

## 2017-04-04 MED ORDER — GLYCOPYRROLATE 0.2 MG/ML IJ SOLN
INTRAMUSCULAR | Status: AC
  Administered 2017-04-04: 0.4 mg via INTRAVENOUS
  Filled 2017-04-04: qty 2

## 2017-04-04 MED ORDER — FENTANYL CITRATE (PF) 100 MCG/2ML IJ SOLN: 25.0000 ug | INTRAMUSCULAR | Status: DC | PRN

## 2017-04-04 MED ORDER — SUCCINYLCHOLINE CHLORIDE 20 MG/ML IJ SOLN
INTRAMUSCULAR | Status: AC
  Filled 2017-04-04: qty 1

## 2017-04-04 MED ORDER — METHOHEXITAL SODIUM 0.5 G IJ SOLR
INTRAMUSCULAR | Status: AC
  Filled 2017-04-04: qty 500

## 2017-04-04 MED ORDER — LABETALOL HCL 5 MG/ML IV SOLN
INTRAVENOUS | Status: AC
  Filled 2017-04-04: qty 4

## 2017-04-04 MED ORDER — ONDANSETRON HCL 4 MG/2ML IJ SOLN: 4.0000 mg | Freq: Once | INTRAMUSCULAR | Status: DC | PRN

## 2017-04-04 NOTE — Progress Notes (Signed)
Restless upon arrival but during well now

## 2017-04-04 NOTE — Anesthesia Postprocedure Evaluation (Signed)
Anesthesia Post Note  Patient: Melinda [name]  Procedure(s) Performed: * No procedures listed *  Patient location during evaluation: PACU Anesthesia Type: General Level of consciousness: awake and alert Pain management: pain level controlled Vital Signs Assessment: post-procedure vital signs reviewed and stable Respiratory status: spontaneous breathing, nonlabored ventilation, respiratory function stable and patient connected to nasal cannula oxygen Cardiovascular status: blood pressure returned to baseline and stable Postop Assessment: no signs of nausea or vomiting Anesthetic complications: no     Last Vitals:  Vitals:   04/04/17 1056 04/04/17 1103  BP: (!) 110/52 (!) 132/97  Pulse: 91 88  Resp: (!) 25 16  Temp: 36.7 C 36.7 C    Last Pain:  Vitals:   04/04/17 1103  TempSrc: Oral                 [place] [address]

## 2017-04-04 NOTE — Anesthesia Preprocedure Evaluation (Signed)
Anesthesia Evaluation  Patient identified by MRN, date of birth, ID band Patient awake    Reviewed: Allergy & Precautions, NPO status , Patient's Chart, lab work & pertinent test results, reviewed documented beta blocker date and time   Airway Mallampati: III  TM Distance: >3 FB     Dental  (+) Chipped   Pulmonary sleep apnea ,           Cardiovascular hypertension, Pt. on medications      Neuro/Psych PSYCHIATRIC DISORDERS Depression Bipolar Disorder  Neuromuscular disease    GI/Hepatic GERD  Controlled,  Endo/Other  diabetes, Type 2  Renal/GU      Musculoskeletal   Abdominal   Peds  Hematology   Anesthesia Other Findings Obese.  Reproductive/Obstetrics                             Anesthesia Physical Anesthesia Plan  ASA: III  Anesthesia Plan: General   Post-op Pain Management:    Induction: Intravenous  Airway Management Planned: Mask  Additional Equipment:   Intra-op Plan:   Post-operative Plan:   Informed Consent: I have reviewed the patients History and Physical, chart, labs and discussed the procedure including the risks, benefits and alternatives for the proposed anesthesia with the patient or authorized representative who has indicated his/her understanding and acceptance.     Plan Discussed with: CRNA  Anesthesia Plan Comments:         Anesthesia Quick Evaluation

## 2017-04-04 NOTE — Discharge Instructions (Signed)
1)  The drugs that you have been given will stay in your system until tomorrow so for the       next 24 hours you should not:  A. Drive an automobile  B. Make any legal decisions  C. Drink any alcoholic beverages  2)  You may resume your regular meals upon return home.  3)  A responsible adult must take you home.  Someone should stay with you for a few          hours, then be available by phone for the remainder of the treatment day.  4)  You May experience any of the following symptoms:  Headache, Nausea and a dry mouth (due to the medications you were given),  temporary memory loss and some confusion, or sore muscles (a warm bath  should help this).  If you you experience any of these symptoms let us know on                your return visit.  5)  Report any of the following: any acute discomfort, severe headache, or temperature        greater than 100.5 F.   Also report any unusual redness, swelling, drainage, or pain         at your IV site.    You may report Symptoms to:  [place] at [facility]          Phone: [phone], ECT Department           or Dr. Prescott Gum office [phone]  6)  Your next ECT Treatment is Friday June 1   We will call 2 days prior to your scheduled appointment for arrival times.  7)  Nothing to eat or drink after midnight the night before your procedure.  8)  Take     With a sip of water the morning of your procedure.  9)  Other Instructions: Call [phone] to cancel the morning of your procedure due         to illness or emergency.  10) We will call within 72 hours to assess how you are feeling.

## 2017-04-04 NOTE — Anesthesia Post-op Follow-up Note (Cosign Needed)
Anesthesia QCDR form completed.        

## 2017-04-04 NOTE — Procedures (Signed)
ECT SERVICES Physician's Interval Evaluation & Treatment Note  Patient Identification: Melinda [name] MRN:  [id number] Date of Evaluation:  04/04/2017 TX #: [id number]  MADRS:   MMSE:   P.E. Findings:  No change to physical exam. Vitals unremarkable. Heart and lungs normal.  Psychiatric Interval Note:  Mood continues to be down. She has a lot of worries about finances and personal situations  Subjective:  Patient is a 46 y.o. female seen for evaluation for Electroconvulsive Therapy. A little more down but not actively suicidal  Treatment Summary:   []   Right Unilateral             [x]  Bilateral   % Energy : 1.0 ms 35%   Impedance: 1260 ohms  Seizure Energy Index:    Postictal Suppression Index:    Seizure Concordance Index:    Medications  Pre Shock: Robinul 0.4 mg Toradol 30 mg labetalol 20 mg esmolol 10 mg Brevital 70 mg succinylcholine 100 mg  Post Shock:    Seizure Duration: 40 seconds by EMG 78 seconds by EEG   Comments: Unclear why the computer readings were not made but she had a very clean and clear seizure with good postictal suppression by all visual inspection. Follow-up next week as usual   Lungs:  [x]   Clear to auscultation               []  Other:   Heart:    [x]   Regular rhythm             []  irregular rhythm    [x]   Previous H&P reviewed, patient examined and there are NO CHANGES                 []   Previous H&P reviewed, patient examined and there are changes noted.   [name], MD 5/25/201810:13 AM

## 2017-04-04 NOTE — H&P (Signed)
Melinda [name] is an 46 y.o. female.   Chief Complaint: Continues to feel down. Several weeks in a row. Not actively suicidal not psychotic HPI: History of recurrent severe depression with ECT crucial in helping to maintain stability  Past Medical History:  Diagnosis Date  . Depression   . Diabetes mellitus without complication ([place])   . Diabetic peripheral neuropathy ([place]) 03/07/15  . Diabetic peripheral neuropathy ([place]) 03/07/15  . Diabetic peripheral neuropathy ([place]) 03/07/15  . GERD (gastroesophageal reflux disease)   . Hypercholesterolemia 03/07/15  . Hypertension   . Obesity 03/07/15  . Personality disorder 03/07/15  . Sinus tachycardia 03/07/15   history of  . Suicidal thoughts 03/07/15    Past Surgical History:  Procedure Laterality Date  . electroconvulsion therapy  03/07/15    Family History  Problem Relation Age of Onset  . Hypertension Father   . Diabetes Mother    Social History:  reports that she has never smoked. She has never used smokeless tobacco. She reports that she does not drink alcohol or use drugs.  Allergies:  Allergies  Allergen Reactions  . Prednisone     Increases blood sugar     (Not in a hospital admission)  No results found for this or any previous visit (from the past 48 hour(s)). No results found.  Review of Systems  Constitutional: Negative.   HENT: Negative.   Eyes: Negative.   Respiratory: Negative.   Cardiovascular: Negative.   Gastrointestinal: Negative.   Musculoskeletal: Negative.   Skin: Negative.   Neurological: Negative.   Psychiatric/Behavioral: Positive for depression. Negative for hallucinations, memory loss, substance abuse and suicidal ideas. The patient is not nervous/anxious and does not have insomnia.     Blood pressure (!) 141/82, pulse 90, temperature 98 F (36.7 C), temperature source Oral, resp. rate 16, height 5\' 5"  (1.651 m), weight 104.8 kg (231 lb), last menstrual period 03/26/2017, SpO2 100 %. Physical  Exam  Nursing note and vitals reviewed. Constitutional: She appears well-developed and well-nourished.  HENT:  Head: Normocephalic and atraumatic.  Eyes: Conjunctivae are normal. Pupils are equal, round, and reactive to light.  Neck: Normal range of motion.  Cardiovascular: Regular rhythm and normal heart sounds.   Respiratory: Effort normal. No respiratory distress.  GI: Soft.  Musculoskeletal: Normal range of motion.  Neurological: She is alert.  Skin: Skin is warm and dry.  Psychiatric: Judgment normal. Her affect is blunt. Her speech is delayed. She is slowed. Thought content is not paranoid. Cognition and memory are normal. She expresses no homicidal and no suicidal ideation.     Assessment/Plan Treatment today follow-up next week spent some time doing supportive counseling. No change to medication.  [name], MD 04/04/2017, 10:11 AM

## 2017-04-04 NOTE — Transfer of Care (Signed)
Immediate Anesthesia Transfer of Care Note  Patient: Melinda [name]  Procedure(s) Performed: * No procedures listed *  Patient Location: PACU  Anesthesia Type:General  Level of Consciousness: sedated  Airway & Oxygen Therapy: Patient Spontanous Breathing and Patient connected to face mask oxygen  Post-op Assessment: Report given to RN and Post -op Vital signs reviewed and stable  Post vital signs: Reviewed and stable  Last Vitals:  Vitals:   04/04/17 0825 04/04/17 1030  BP: (!) 141/82   Pulse: 90   Resp: 16   Temp: 36.7 C (P) 20.6 C    Complications: No apparent anesthesia complications

## 2017-04-04 NOTE — Anesthesia Procedure Notes (Signed)
Date/Time: 04/04/2017 10:10 AM Performed by: Doreen Salvage Pre-anesthesia Checklist: Patient identified, Emergency Drugs available, Suction available and Patient being monitored Patient Re-evaluated:Patient Re-evaluated prior to inductionOxygen Delivery Method: Circle system utilized Preoxygenation: Pre-oxygenation with 100% oxygen Intubation Type: IV induction Ventilation: Mask ventilation without difficulty and Mask ventilation throughout procedure Airway Equipment and Method: Bite block Placement Confirmation: positive ETCO2 Dental Injury: Teeth and Oropharynx as per pre-operative assessment

## 2017-04-10 ENCOUNTER — Other Ambulatory Visit: Payer: Self-pay | Admitting: Psychiatry

## 2017-04-11 ENCOUNTER — Encounter
Admission: RE | Admit: 2017-04-11 | Discharge: 2017-04-11 | Disposition: A | Discharge status: Home / Self Care | Payer: Medicare PPO | Source: Ambulatory Visit | Attending: Psychiatry | Admitting: Psychiatry

## 2017-04-11 ENCOUNTER — Encounter: Payer: Self-pay | Admitting: Anesthesiology

## 2017-04-11 DIAGNOSIS — F339 Major depressive disorder, recurrent, unspecified: Secondary | ICD-10-CM | POA: Diagnosis not present

## 2017-04-11 DIAGNOSIS — Z8249 Family history of ischemic heart disease and other diseases of the circulatory system: Secondary | ICD-10-CM | POA: Insufficient documentation

## 2017-04-11 DIAGNOSIS — Z6837 Body mass index (BMI) 37.0-37.9, adult: Secondary | ICD-10-CM | POA: Insufficient documentation

## 2017-04-11 DIAGNOSIS — F609 Personality disorder, unspecified: Secondary | ICD-10-CM | POA: Diagnosis not present

## 2017-04-11 DIAGNOSIS — E669 Obesity, unspecified: Secondary | ICD-10-CM | POA: Diagnosis not present

## 2017-04-11 DIAGNOSIS — E1142 Type 2 diabetes mellitus with diabetic polyneuropathy: Secondary | ICD-10-CM | POA: Diagnosis not present

## 2017-04-11 DIAGNOSIS — F332 Major depressive disorder, recurrent severe without psychotic features: Secondary | ICD-10-CM | POA: Diagnosis not present

## 2017-04-11 DIAGNOSIS — Z833 Family history of diabetes mellitus: Secondary | ICD-10-CM | POA: Diagnosis not present

## 2017-04-11 DIAGNOSIS — K219 Gastro-esophageal reflux disease without esophagitis: Secondary | ICD-10-CM | POA: Insufficient documentation

## 2017-04-11 DIAGNOSIS — E119 Type 2 diabetes mellitus without complications: Secondary | ICD-10-CM | POA: Diagnosis not present

## 2017-04-11 DIAGNOSIS — E7800 Pure hypercholesterolemia, unspecified: Secondary | ICD-10-CM | POA: Diagnosis not present

## 2017-04-11 DIAGNOSIS — I10 Essential (primary) hypertension: Secondary | ICD-10-CM | POA: Diagnosis not present

## 2017-04-11 DIAGNOSIS — F3189 Other bipolar disorder: Secondary | ICD-10-CM | POA: Diagnosis not present

## 2017-04-11 LAB — POCT PREGNANCY, URINE: Preg Test, Ur: NEGATIVE

## 2017-04-11 LAB — GLUCOSE, CAPILLARY: Glucose-Capillary: 107 mg/dL — ABNORMAL HIGH (ref 65–99)

## 2017-04-11 MED ORDER — METHOHEXITAL SODIUM 0.5 G IJ SOLR
INTRAMUSCULAR | Status: AC
  Filled 2017-04-11: qty 500

## 2017-04-11 MED ORDER — KETOROLAC TROMETHAMINE 30 MG/ML IJ SOLN
INTRAMUSCULAR | Status: AC
  Filled 2017-04-11: qty 1

## 2017-04-11 MED ORDER — FENTANYL CITRATE (PF) 100 MCG/2ML IJ SOLN: 25.0000 ug | INTRAMUSCULAR | Status: DC | PRN

## 2017-04-11 MED ORDER — SODIUM CHLORIDE 0.9 % IV SOLN
500.0000 mL | Freq: Once | INTRAVENOUS | Status: AC
  Administered 2017-04-11: 1000 mL via INTRAVENOUS

## 2017-04-11 MED ORDER — KETOROLAC TROMETHAMINE 30 MG/ML IJ SOLN
30.0000 mg | Freq: Once | INTRAMUSCULAR | Status: AC
  Administered 2017-04-11: 30 mg via INTRAVENOUS

## 2017-04-11 MED ORDER — GLYCOPYRROLATE 0.2 MG/ML IJ SOLN
0.4000 mg | Freq: Once | INTRAMUSCULAR | Status: AC
  Administered 2017-04-11: 0.4 mg via INTRAVENOUS

## 2017-04-11 MED ORDER — SODIUM CHLORIDE 0.9 % IV SOLN
INTRAVENOUS | Status: DC | PRN
  Administered 2017-04-11: 10:00:00 via INTRAVENOUS

## 2017-04-11 MED ORDER — SUCCINYLCHOLINE CHLORIDE 200 MG/10ML IV SOSY
PREFILLED_SYRINGE | INTRAVENOUS | Status: DC | PRN
Start: 2017-04-11 — End: 2017-04-11
  Administered 2017-04-11: 100 mg via INTRAVENOUS

## 2017-04-11 MED ORDER — GLYCOPYRROLATE 0.2 MG/ML IJ SOLN
INTRAMUSCULAR | Status: AC
  Filled 2017-04-11: qty 2

## 2017-04-11 MED ORDER — LABETALOL HCL 5 MG/ML IV SOLN
INTRAVENOUS | Status: AC
  Filled 2017-04-11: qty 4

## 2017-04-11 MED ORDER — LABETALOL HCL 5 MG/ML IV SOLN
INTRAVENOUS | Status: DC | PRN
  Administered 2017-04-11: 20 mg via INTRAVENOUS

## 2017-04-11 MED ORDER — ESMOLOL HCL 100 MG/10ML IV SOLN
INTRAVENOUS | Status: DC | PRN
  Administered 2017-04-11: 20 mg via INTRAVENOUS

## 2017-04-11 MED ORDER — METHOHEXITAL SODIUM 100 MG/10ML IV SOSY
PREFILLED_SYRINGE | INTRAVENOUS | Status: DC | PRN
  Administered 2017-04-11: 70 mg via INTRAVENOUS

## 2017-04-11 MED ORDER — SUCCINYLCHOLINE CHLORIDE 20 MG/ML IJ SOLN
INTRAMUSCULAR | Status: AC
  Filled 2017-04-11: qty 1

## 2017-04-11 MED ORDER — ESMOLOL HCL 100 MG/10ML IV SOLN
INTRAVENOUS | Status: AC
  Filled 2017-04-11: qty 10

## 2017-04-11 MED ORDER — ONDANSETRON HCL 4 MG/2ML IJ SOLN: 4.0000 mg | Freq: Once | INTRAMUSCULAR | Status: DC | PRN

## 2017-04-11 NOTE — Anesthesia Preprocedure Evaluation (Signed)
Anesthesia Evaluation  Patient identified by MRN, date of birth, ID band Patient awake    Reviewed: Allergy & Precautions, H&P , NPO status , Patient's Chart, lab work & pertinent test results  History of Anesthesia Complications Negative for: history of anesthetic complications  Airway Mallampati: II  TM Distance: >3 FB Neck ROM: full    Dental  (+) Poor Dentition, Chipped   Pulmonary sleep apnea , neg COPD,    Pulmonary exam normal breath sounds clear to auscultation       Cardiovascular hypertension, Pt. on medications (-) CAD and (-) Past MI negative cardio ROS Normal cardiovascular exam Rhythm:regular Rate:Normal     Neuro/Psych PSYCHIATRIC DISORDERS Depression Bipolar Disorder  Neuromuscular disease negative neurological ROS     GI/Hepatic negative GI ROS, Neg liver ROS, GERD  Controlled,  Endo/Other  diabetes, Type 2, Oral Hypoglycemic Agents  Renal/GU negative Renal ROS  negative genitourinary   Musculoskeletal   Abdominal (+) + obese,   Peds  Hematology negative hematology ROS (+)   Anesthesia Other Findings Past Medical History: No date: Depression No date: Diabetes mellitus without complication ([place]) 6/96/78: Diabetic peripheral neuropathy ([place]) 03/07/15: Diabetic peripheral neuropathy ([place]) 03/07/15: Diabetic peripheral neuropathy ([facility]) No date: GERD (gastroesophageal reflux disease) 03/07/15: Hypercholesterolemia No date: Hypertension 03/07/15: Obesity 03/07/15: Personality disorder 03/07/15: Sinus tachycardia ([place])     Comment: history of 03/07/15: Suicidal thoughts Past Surgical History: 03/07/15: electroconvulsion therapy BMI    Body Mass Index:  36.44 kg/m     Reproductive/Obstetrics                             Anesthesia Physical  Anesthesia Plan  ASA: III  Anesthesia Plan: General   Post-op Pain Management:    Induction: Intravenous  Airway Management  Planned: Mask  Additional Equipment:   Intra-op Plan:   Post-operative Plan:   Informed Consent: I have reviewed the patients History and Physical, chart, labs and discussed the procedure including the risks, benefits and alternatives for the proposed anesthesia with the patient or authorized representative who has indicated his/her understanding and acceptance.   Dental Advisory Given  Plan Discussed with: CRNA and Anesthesiologist  Anesthesia Plan Comments: (Patient consented for risks of anesthesia including but not limited to:  - adverse reactions to medications - damage to teeth, lips or other oral mucosa - sore throat or hoarseness - Damage to heart, brain, lungs or loss of life  Patient voiced understanding.)        Anesthesia Quick Evaluation

## 2017-04-11 NOTE — Anesthesia Postprocedure Evaluation (Signed)
Anesthesia Post Note  Patient: Melinda [name]  Procedure(s) Performed: * No procedures listed *  Patient location during evaluation: PACU Anesthesia Type: General Level of consciousness: awake and alert and oriented Pain management: pain level controlled Vital Signs Assessment: post-procedure vital signs reviewed and stable Respiratory status: spontaneous breathing Cardiovascular status: blood pressure returned to baseline Anesthetic complications: no     Last Vitals:  Vitals:   04/11/17 1052 04/11/17 1101  BP: 100/60 112/74  Pulse: 85 85  Resp: 20 16  Temp: 36.8 C     Last Pain:  Vitals:   04/11/17 0833  TempSrc: Oral                 Szymon Foiles

## 2017-04-11 NOTE — H&P (Signed)
Melinda [name] is an 46 y.o. female.   Chief Complaint: Patient has no specific new complaints today HPI: Chronic depression partially sustained with maintenance ECT  Past Medical History:  Diagnosis Date  . Depression   . Diabetes mellitus without complication ([place])   . Diabetic peripheral neuropathy ([place]) 03/07/15  . Diabetic peripheral neuropathy ([place]) 03/07/15  . Diabetic peripheral neuropathy ([place]) 03/07/15  . GERD (gastroesophageal reflux disease)   . Hypercholesterolemia 03/07/15  . Hypertension   . Obesity 03/07/15  . Personality disorder 03/07/15  . Sinus tachycardia 03/07/15   history of  . Suicidal thoughts 03/07/15    Past Surgical History:  Procedure Laterality Date  . electroconvulsion therapy  03/07/15    Family History  Problem Relation Age of Onset  . Hypertension Father   . Diabetes Mother    Social History:  reports that she has never smoked. She has never used smokeless tobacco. She reports that she does not drink alcohol or use drugs.  Allergies:  Allergies  Allergen Reactions  . Prednisone     Increases blood sugar     (Not in a hospital admission)  Results for orders placed or performed during the hospital encounter of 04/11/17 (from the past 48 hour(s))  Pregnancy, urine POC     Status: None   Collection Time: 04/11/17  8:42 AM  Result Value Ref Range   Preg Test, Ur NEGATIVE NEGATIVE    Comment:        THE SENSITIVITY OF THIS METHODOLOGY IS >24 mIU/mL   Glucose, capillary     Status: Abnormal   Collection Time: 04/11/17  8:47 AM  Result Value Ref Range   Glucose-Capillary 107 (H) 65 - 99 mg/dL   Comment 1 Notify RN    No results found.  Review of Systems  Constitutional: Negative.   HENT: Negative.   Eyes: Negative.   Respiratory: Negative.   Cardiovascular: Negative.   Gastrointestinal: Negative.   Musculoskeletal: Negative.   Skin: Negative.   Neurological: Negative.   Psychiatric/Behavioral: Positive for depression. Negative  for hallucinations, memory loss, substance abuse and suicidal ideas. The patient is not nervous/anxious and does not have insomnia.     Blood pressure 121/82, pulse 86, temperature 97.9 F (36.6 C), temperature source Oral, resp. rate 18, height 5\' 5"  (1.651 m), weight 105.2 kg (232 lb), last menstrual period 03/26/2017, SpO2 100 %. Physical Exam  Nursing note and vitals reviewed. Constitutional: She appears well-developed and well-nourished.  HENT:  Head: Normocephalic and atraumatic.  Eyes: Conjunctivae are normal. Pupils are equal, round, and reactive to light.  Neck: Normal range of motion.  Cardiovascular: Regular rhythm and normal heart sounds.   Respiratory: Effort normal and breath sounds normal. No respiratory distress.  GI: Soft.  Musculoskeletal: Normal range of motion.  Neurological: She is alert.  Skin: Skin is warm and dry.  Psychiatric: Judgment normal. Her affect is blunt. Her speech is delayed. She is slowed. She expresses no homicidal and no suicidal ideation. She exhibits abnormal recent memory. She exhibits normal remote memory.     Assessment/Plan Treatment today. Treatment team discussed today how she is showing some more memory problems. I'm going to readdress with her the idea of cutting back on frequency again.  [name], MD 04/11/2017, 10:17 AM

## 2017-04-11 NOTE — Transfer of Care (Signed)
Immediate Anesthesia Transfer of Care Note  Patient: Melinda [name]  Procedure(s) Performed: ECT  Patient Location: PACU  Anesthesia Type:General  Level of Consciousness: sedated  Airway & Oxygen Therapy: Patient Spontanous Breathing and Patient connected to face mask oxygen  Post-op Assessment: Report given to RN and Post -op Vital signs reviewed and stable  Post vital signs: Reviewed and stable  Last Vitals:  Vitals:   04/11/17 0833 04/11/17 1025  BP: 121/82 (!) 107/92  Pulse: 86 92  Resp: 18 17  Temp: 36.6 C 37.1 C    Last Pain:  Vitals:   04/11/17 0833  TempSrc: Oral         Complications: No apparent anesthesia complications

## 2017-04-11 NOTE — Discharge Instructions (Signed)
1)  The drugs that you have been given will stay in your system until tomorrow so for the       next 24 hours you should not:  A. Drive an automobile  B. Make any legal decisions  C. Drink any alcoholic beverages  2)  You may resume your regular meals upon return home.  3)  A responsible adult must take you home.  Someone should stay with you for a few          hours, then be available by phone for the remainder of the treatment day.  4)  You May experience any of the following symptoms:  Headache, Nausea and a dry mouth (due to the medications you were given),  temporary memory loss and some confusion, or sore muscles (a warm bath  should help this).  If you you experience any of these symptoms let us know on                your return visit.  5)  Report any of the following: any acute discomfort, severe headache, or temperature        greater than 100.5 F.   Also report any unusual redness, swelling, drainage, or pain         at your IV site.    You may report Symptoms to:  [place] [facility]          Phone: [phone], ECT Department           or Dr. Prescott Gum office [phone]  6)  Your next ECT Treatment is Day Friday  Date June 8th at 815am  We will call 2 days prior to your scheduled appointment for arrival times.  7)  Nothing to eat or drink after midnight the night before your procedure.  8)  Take .   With a sip of water the morning of your procedure.  9)  Other Instructions: Call [phone] to cancel the morning of your procedure due         to illness or emergency.  10) We will call within 72 hours to assess how you are feeling.

## 2017-04-11 NOTE — Procedures (Signed)
ECT SERVICES Physician's Interval Evaluation & Treatment Note  Patient Identification: Melinda [name] MRN:  [id number] Date of Evaluation:  04/11/2017 TX #: [id number]  MADRS: 20  MMSE: 26  P.E. Findings:  physical exam. Heart and lungs normal. Vitals unremarkable. No sign of skin damage  Psychiatric Interval Note:  Mood is reported as just being okay as usual. Not severely depressed not talking about suicidality  Subjective:  Patient is a 46 y.o. female seen for evaluation for Electroconvulsive Therapy. No specific new  Treatment Summary:   []   Right Unilateral             [x]  Bilateral   % Energy : 1.0 ms 35%   Impedance: 1750 ohms  Seizure Energy Index: 4080 V squared  Postictal Suppression Index: 83%  Seizure Concordance Index: 93%  Medications  Pre Shock: Robinul 0.4 mg Toradol 30 mg labetalol 20 mg esmolol 10 mg Brevital 70 mg succinylcholine 100 g  Post Shock:    Seizure Duration: 37 seconds by EMG 72 seconds by EEG   Comments: Follow-up next week   Lungs:  [x]   Clear to auscultation               []  Other:   Heart:    [x]   Regular rhythm             []  irregular rhythm    [x]   Previous H&P reviewed, patient examined and there are NO CHANGES                 []   Previous H&P reviewed, patient examined and there are changes noted.   [name], MD 6/1/201810:19 AM

## 2017-04-11 NOTE — Anesthesia Post-op Follow-up Note (Cosign Needed)
Anesthesia QCDR form completed.        

## 2017-04-11 NOTE — Anesthesia Procedure Notes (Signed)
Date/Time: 04/11/2017 10:14 AM Performed by: Dionne Bucy Pre-anesthesia Checklist: Patient identified, Emergency Drugs available, Suction available and Patient being monitored Patient Re-evaluated:Patient Re-evaluated prior to inductionOxygen Delivery Method: Circle system utilized Preoxygenation: Pre-oxygenation with 100% oxygen Intubation Type: IV induction Ventilation: Mask ventilation without difficulty and Mask ventilation throughout procedure Airway Equipment and Method: Bite block Placement Confirmation: positive ETCO2 Dental Injury: Teeth and Oropharynx as per pre-operative assessment

## 2017-04-17 ENCOUNTER — Other Ambulatory Visit: Payer: Self-pay | Admitting: Psychiatry

## 2017-04-18 ENCOUNTER — Encounter
Admission: RE | Admit: 2017-04-18 | Discharge: 2017-04-18 | Disposition: A | Discharge status: Home / Self Care | Payer: Medicare PPO | Source: Ambulatory Visit | Attending: Psychiatry | Admitting: Psychiatry

## 2017-04-18 ENCOUNTER — Encounter: Payer: Self-pay | Admitting: Anesthesiology

## 2017-04-18 DIAGNOSIS — E119 Type 2 diabetes mellitus without complications: Secondary | ICD-10-CM | POA: Insufficient documentation

## 2017-04-18 DIAGNOSIS — Z6839 Body mass index (BMI) 39.0-39.9, adult: Secondary | ICD-10-CM | POA: Insufficient documentation

## 2017-04-18 DIAGNOSIS — E669 Obesity, unspecified: Secondary | ICD-10-CM | POA: Insufficient documentation

## 2017-04-18 DIAGNOSIS — K219 Gastro-esophageal reflux disease without esophagitis: Secondary | ICD-10-CM | POA: Insufficient documentation

## 2017-04-18 DIAGNOSIS — E7800 Pure hypercholesterolemia, unspecified: Secondary | ICD-10-CM | POA: Insufficient documentation

## 2017-04-18 DIAGNOSIS — I10 Essential (primary) hypertension: Secondary | ICD-10-CM | POA: Diagnosis not present

## 2017-04-18 DIAGNOSIS — F339 Major depressive disorder, recurrent, unspecified: Secondary | ICD-10-CM | POA: Insufficient documentation

## 2017-04-18 DIAGNOSIS — F3189 Other bipolar disorder: Secondary | ICD-10-CM | POA: Diagnosis not present

## 2017-04-18 DIAGNOSIS — F332 Major depressive disorder, recurrent severe without psychotic features: Secondary | ICD-10-CM | POA: Diagnosis not present

## 2017-04-18 DIAGNOSIS — G4730 Sleep apnea, unspecified: Secondary | ICD-10-CM | POA: Diagnosis not present

## 2017-04-18 LAB — GLUCOSE, CAPILLARY: Glucose-Capillary: 94 mg/dL (ref 65–99)

## 2017-04-18 LAB — POCT PREGNANCY, URINE: Preg Test, Ur: NEGATIVE

## 2017-04-18 MED ORDER — METHOHEXITAL SODIUM 0.5 G IJ SOLR
INTRAMUSCULAR | Status: AC
  Filled 2017-04-18: qty 500

## 2017-04-18 MED ORDER — KETOROLAC TROMETHAMINE 30 MG/ML IJ SOLN
30.0000 mg | Freq: Once | INTRAMUSCULAR | Status: AC
  Administered 2017-04-18: 30 mg via INTRAVENOUS

## 2017-04-18 MED ORDER — LABETALOL HCL 5 MG/ML IV SOLN
INTRAVENOUS | Status: AC
  Filled 2017-04-18: qty 4

## 2017-04-18 MED ORDER — SODIUM CHLORIDE 0.9 % IV SOLN
INTRAVENOUS | Status: DC | PRN
  Administered 2017-04-18: 10:00:00 via INTRAVENOUS

## 2017-04-18 MED ORDER — SUCCINYLCHOLINE CHLORIDE 20 MG/ML IJ SOLN
INTRAMUSCULAR | Status: DC | PRN
Start: 2017-04-18 — End: 2017-04-18
  Administered 2017-04-18: 100 mg via INTRAVENOUS

## 2017-04-18 MED ORDER — SUCCINYLCHOLINE CHLORIDE 20 MG/ML IJ SOLN
INTRAMUSCULAR | Status: AC
  Filled 2017-04-18: qty 1

## 2017-04-18 MED ORDER — GLYCOPYRROLATE 0.2 MG/ML IJ SOLN
INTRAMUSCULAR | Status: AC
  Administered 2017-04-18: 0.4 mg via INTRAVENOUS
  Filled 2017-04-18: qty 2

## 2017-04-18 MED ORDER — ESMOLOL HCL 100 MG/10ML IV SOLN
INTRAVENOUS | Status: AC
  Filled 2017-04-18: qty 10

## 2017-04-18 MED ORDER — SODIUM CHLORIDE 0.9 % IV SOLN
500.0000 mL | Freq: Once | INTRAVENOUS | Status: AC
  Administered 2017-04-18: 1000 mL via INTRAVENOUS

## 2017-04-18 MED ORDER — KETOROLAC TROMETHAMINE 30 MG/ML IJ SOLN
INTRAMUSCULAR | Status: AC
  Administered 2017-04-18: 30 mg via INTRAVENOUS
  Filled 2017-04-18: qty 1

## 2017-04-18 MED ORDER — GLYCOPYRROLATE 0.2 MG/ML IJ SOLN
0.4000 mg | Freq: Once | INTRAMUSCULAR | Status: AC
  Administered 2017-04-18: 0.4 mg via INTRAVENOUS

## 2017-04-18 MED ORDER — METHOHEXITAL SODIUM 100 MG/10ML IV SOSY
PREFILLED_SYRINGE | INTRAVENOUS | Status: DC | PRN
Start: 2017-04-18 — End: 2017-04-18
  Administered 2017-04-18: 70 mg via INTRAVENOUS

## 2017-04-18 MED ORDER — LABETALOL HCL 5 MG/ML IV SOLN
INTRAVENOUS | Status: DC | PRN
  Administered 2017-04-18: 20 mg via INTRAVENOUS

## 2017-04-18 MED ORDER — ESMOLOL HCL 100 MG/10ML IV SOLN
INTRAVENOUS | Status: DC | PRN
  Administered 2017-04-18: 20 mg via INTRAVENOUS

## 2017-04-18 NOTE — Progress Notes (Signed)
Fluids for procedure and PACU 300cc

## 2017-04-18 NOTE — Anesthesia Postprocedure Evaluation (Signed)
Anesthesia Post Note  Patient: Melinda [name]  Procedure(s) Performed: * No procedures listed *  Patient location during evaluation: PACU Anesthesia Type: General Level of consciousness: awake and alert Pain management: pain level controlled Vital Signs Assessment: post-procedure vital signs reviewed and stable Respiratory status: spontaneous breathing and respiratory function stable Cardiovascular status: stable Anesthetic complications: no     Last Vitals:  Vitals:   04/18/17 1053 04/18/17 1058  BP: 108/64 (!) 113/92  Pulse: 91 85  Resp: (!) 25 18  Temp:      Last Pain:  Vitals:   04/18/17 1023  TempSrc: Temporal                 [name]

## 2017-04-18 NOTE — Anesthesia Post-op Follow-up Note (Cosign Needed)
Anesthesia QCDR form completed.        

## 2017-04-18 NOTE — Anesthesia Preprocedure Evaluation (Signed)
Anesthesia Evaluation  Patient identified by MRN, date of birth, ID band Patient awake    Reviewed: Allergy & Precautions, H&P , NPO status , Patient's Chart, lab work & pertinent test results  History of Anesthesia Complications Negative for: history of anesthetic complications  Airway Mallampati: II  TM Distance: >3 FB Neck ROM: full    Dental  (+) Poor Dentition, Chipped   Pulmonary sleep apnea , neg COPD,    Pulmonary exam normal breath sounds clear to auscultation       Cardiovascular hypertension, Pt. on medications (-) CAD and (-) Past MI negative cardio ROS Normal cardiovascular exam Rhythm:regular Rate:Normal     Neuro/Psych PSYCHIATRIC DISORDERS Depression Bipolar Disorder  Neuromuscular disease negative neurological ROS     GI/Hepatic negative GI ROS, Neg liver ROS, GERD  Controlled,  Endo/Other  diabetes, Type 2, Oral Hypoglycemic Agents  Renal/GU negative Renal ROS  negative genitourinary   Musculoskeletal   Abdominal (+) + obese,   Peds  Hematology negative hematology ROS (+)   Anesthesia Other Findings Past Medical History: No date: Depression No date: Diabetes mellitus without complication ([facility]) 03/07/15: Diabetic peripheral neuropathy ([facility]) 03/07/15: Diabetic peripheral neuropathy ([facility]) 03/07/15: Diabetic peripheral neuropathy ([facility]) No date: GERD (gastroesophageal reflux disease) 03/07/15: Hypercholesterolemia No date: Hypertension 03/07/15: Obesity 03/07/15: Personality disorder 03/07/15: Sinus tachycardia ([facility])     Comment: history of 03/07/15: Suicidal thoughts Past Surgical History: 03/07/15: electroconvulsion therapy BMI    Body Mass Index:  36.44 kg/m     Reproductive/Obstetrics                             Anesthesia Physical  Anesthesia Plan  ASA: III  Anesthesia Plan: General   Post-op Pain Management:    Induction: Intravenous  PONV Risk Score and  Plan:   Airway Management Planned: Mask  Additional Equipment:   Intra-op Plan:   Post-operative Plan:   Informed Consent: I have reviewed the patients History and Physical, chart, labs and discussed the procedure including the risks, benefits and alternatives for the proposed anesthesia with the patient or authorized representative who has indicated his/her understanding and acceptance.   Dental Advisory Given  Plan Discussed with: CRNA and Anesthesiologist  Anesthesia Plan Comments: (Patient consented for risks of anesthesia including but not limited to:  - adverse reactions to medications - damage to teeth, lips or other oral mucosa - sore throat or hoarseness - Damage to heart, brain, lungs or loss of life  Patient voiced understanding.)        Anesthesia Quick Evaluation  

## 2017-04-18 NOTE — H&P (Signed)
Melinda [name] is an 46 y.o. female.   Chief Complaint: Patient is still feeling down although she looks a little bit brighter not complaining specifically of being severely depressed HPI: Chronic severe depression and stabilized with ECT  Past Medical History:  Diagnosis Date  . Depression   . Diabetes mellitus without complication ([place])   . Diabetic peripheral neuropathy ([place]) 03/07/15  . Diabetic peripheral neuropathy ([place]) 03/07/15  . Diabetic peripheral neuropathy ([place]) 03/07/15  . GERD (gastroesophageal reflux disease)   . Hypercholesterolemia 03/07/15  . Hypertension   . Obesity 03/07/15  . Personality disorder 03/07/15  . Sinus tachycardia 03/07/15   history of  . Suicidal thoughts 03/07/15    Past Surgical History:  Procedure Laterality Date  . electroconvulsion therapy  03/07/15    Family History  Problem Relation Age of Onset  . Hypertension Father   . Diabetes Mother    Social History:  reports that she has never smoked. She has never used smokeless tobacco. She reports that she does not drink alcohol or use drugs.  Allergies:  Allergies  Allergen Reactions  . Prednisone     Increases blood sugar     (Not in a hospital admission)  Results for orders placed or performed during the hospital encounter of 04/18/17 (from the past 48 hour(s))  Pregnancy, urine POC     Status: None   Collection Time: 04/18/17  8:14 AM  Result Value Ref Range   Preg Test, Ur NEGATIVE NEGATIVE    Comment:        THE SENSITIVITY OF THIS METHODOLOGY IS >24 mIU/mL   Glucose, capillary     Status: None   Collection Time: 04/18/17  8:32 AM  Result Value Ref Range   Glucose-Capillary 94 65 - 99 mg/dL   No results found.  Review of Systems  Constitutional: Negative.   HENT: Negative.   Eyes: Negative.   Respiratory: Negative.   Cardiovascular: Negative.   Gastrointestinal: Negative.   Musculoskeletal: Negative.   Skin: Negative.   Neurological: Negative.    Psychiatric/Behavioral: Negative.     Blood pressure (!) 154/70, pulse (!) 102, temperature 98.3 F (36.8 C), temperature source Oral, resp. rate 16, height 5\' 5"  (1.651 m), weight 108 kg (238 lb), last menstrual period 03/26/2017, SpO2 98 %. Physical Exam  Nursing note and vitals reviewed. Constitutional: She appears well-developed and well-nourished.  HENT:  Head: Normocephalic and atraumatic.  Eyes: Conjunctivae are normal. Pupils are equal, round, and reactive to light.  Neck: Normal range of motion.  Cardiovascular: Regular rhythm and normal heart sounds.   Respiratory: Effort normal. No respiratory distress.  GI: Soft.  Musculoskeletal: Normal range of motion.  Neurological: She is alert.  Skin: Skin is warm and dry.  Psychiatric: Judgment and thought content normal. Her affect is blunt. Her speech is delayed. She is slowed. Cognition and memory are normal.     Assessment/Plan No new physical problems ECT. We are continuing on a weekly schedule although I am working with the patient consider a decrease in frequency at some point.  [name], MD 04/18/2017, 10:05 AM

## 2017-04-18 NOTE — Procedures (Signed)
ECT SERVICES Physician's Interval Evaluation & Treatment Note  Patient Identification: Melinda [name] MRN:  [id number] Date of Evaluation:  04/18/2017 TX #: [id number]  MADRS:   MMSE:   P.E. Findings:  No change to physical exam  Psychiatric Interval Note:  Continues to report being depressed but mood seems  Subjective:  Patient is a 46 y.o. female seen for evaluation for Electroconvulsive Therapy. No new complaints  Treatment Summary:   []   Right Unilateral             [x]  Bilateral   % Energy : 1.0 ms 35%   Impedance: 1480 mg  Seizure Energy Index: No reading no reading  Postictal Suppression Index: No reading  Seizure Concordance Index: No reading  Medications  Pre Shock: Robinul 0.4 mg labetalol 20 mg as malt 10 mg Toradol 30 g Brevital 70 mg succinylcholine 100 mg  Post Shock: None 36 seconds EMG 54 seconds EEG  Seizure Duration: 34 seconds EMG 54 seconds EEG   Comments: Unclear why there was no reading because it was a very clear-cut seizure. Follow-up one week   Lungs:  [x]   Clear to auscultation               []  Other:   Heart:    [x]   Regular rhythm             []  irregular rhythm    [x]   Previous H&P reviewed, patient examined and there are NO CHANGES                 []   Previous H&P reviewed, patient examined and there are changes noted.   Melinda Berthold, MD 6/8/201810:07 AM

## 2017-04-18 NOTE — Transfer of Care (Signed)
Immediate Anesthesia Transfer of Care Note  Patient: Melinda [name]  Procedure(s) Performed: ECT   Patient Location: PACU  Anesthesia Type:General  Level of Consciousness: awake  Airway & Oxygen Therapy: Patient Spontanous Breathing  Post-op Assessment: Post -op Vital signs reviewed and stable  Post vital signs: stable  Last Vitals:  Vitals:   04/18/17 0824 04/18/17 1023  BP: (!) 154/70   Pulse: (!) 102 96  Resp: 16 11  Temp: 36.8 C 36.7 C    Last Pain:  Vitals:   04/18/17 1023  TempSrc: Temporal         Complications: No apparent anesthesia complications

## 2017-04-18 NOTE — Discharge Instructions (Signed)
1)  The drugs that you have been given will stay in your system until tomorrow so for the       next 24 hours you should not:  A. Drive an automobile  B. Make any legal decisions  C. Drink any alcoholic beverages  2)  You may resume your regular meals upon return home.  3)  A responsible adult must take you home.  Someone should stay with you for a few          hours, then be available by phone for the remainder of the treatment day.  4)  You May experience any of the following symptoms:  Headache, Nausea and a dry mouth (due to the medications you were given),  temporary memory loss and some confusion, or sore muscles (a warm bath  should help this).  If you you experience any of these symptoms let us know on                your return visit.  5)  Report any of the following: any acute discomfort, severe headache, or temperature        greater than 100.5 F.   Also report any unusual redness, swelling, drainage, or pain         at your IV site.    You may report Symptoms to:  [place] [facility]          Phone: [phone], ECT Department           or Dr. Prescott Gum office [phone]  6)  Your next ECT Treatment is Day Friday Date June 15 at 800am  We will call 2 days prior to your scheduled appointment for arrival times.  7)  Nothing to eat or drink after midnight the night before your procedure.  8)  Take .     With a sip of water the morning of your procedure.  9)  Other Instructions: Call [phone] to cancel the morning of your procedure due         to illness or emergency.  10) We will call within 72 hours to assess how you are feeling.

## 2017-04-24 ENCOUNTER — Other Ambulatory Visit: Payer: Self-pay | Admitting: Psychiatry

## 2017-04-25 ENCOUNTER — Encounter: Payer: Self-pay | Admitting: Anesthesiology

## 2017-04-25 ENCOUNTER — Ambulatory Visit
Admission: RE | Admit: 2017-04-25 | Discharge: 2017-04-25 | Disposition: A | Discharge status: Home / Self Care | Payer: Medicare PPO | Source: Ambulatory Visit | Attending: Psychiatry | Admitting: Psychiatry

## 2017-04-25 DIAGNOSIS — F339 Major depressive disorder, recurrent, unspecified: Secondary | ICD-10-CM | POA: Insufficient documentation

## 2017-04-25 DIAGNOSIS — E7800 Pure hypercholesterolemia, unspecified: Secondary | ICD-10-CM | POA: Insufficient documentation

## 2017-04-25 DIAGNOSIS — E119 Type 2 diabetes mellitus without complications: Secondary | ICD-10-CM | POA: Diagnosis not present

## 2017-04-25 DIAGNOSIS — G4730 Sleep apnea, unspecified: Secondary | ICD-10-CM | POA: Diagnosis not present

## 2017-04-25 DIAGNOSIS — Z6839 Body mass index (BMI) 39.0-39.9, adult: Secondary | ICD-10-CM | POA: Insufficient documentation

## 2017-04-25 DIAGNOSIS — F332 Major depressive disorder, recurrent severe without psychotic features: Secondary | ICD-10-CM | POA: Diagnosis not present

## 2017-04-25 DIAGNOSIS — K219 Gastro-esophageal reflux disease without esophagitis: Secondary | ICD-10-CM | POA: Insufficient documentation

## 2017-04-25 DIAGNOSIS — E669 Obesity, unspecified: Secondary | ICD-10-CM | POA: Diagnosis not present

## 2017-04-25 DIAGNOSIS — I10 Essential (primary) hypertension: Secondary | ICD-10-CM | POA: Insufficient documentation

## 2017-04-25 DIAGNOSIS — E1142 Type 2 diabetes mellitus with diabetic polyneuropathy: Secondary | ICD-10-CM | POA: Diagnosis not present

## 2017-04-25 LAB — GLUCOSE, CAPILLARY: Glucose-Capillary: 96 mg/dL (ref 65–99)

## 2017-04-25 MED ORDER — ESMOLOL HCL 100 MG/10ML IV SOLN
INTRAVENOUS | Status: AC
  Filled 2017-04-25: qty 10

## 2017-04-25 MED ORDER — KETOROLAC TROMETHAMINE 30 MG/ML IJ SOLN
30.0000 mg | Freq: Once | INTRAMUSCULAR | Status: AC
  Administered 2017-04-25: 30 mg via INTRAVENOUS

## 2017-04-25 MED ORDER — GLYCOPYRROLATE 0.2 MG/ML IJ SOLN
0.4000 mg | Freq: Once | INTRAMUSCULAR | Status: AC
  Administered 2017-04-25: 0.4 mg via INTRAVENOUS

## 2017-04-25 MED ORDER — LABETALOL HCL 5 MG/ML IV SOLN
INTRAVENOUS | Status: DC | PRN
  Administered 2017-04-25: 20 mg via INTRAVENOUS

## 2017-04-25 MED ORDER — SUCCINYLCHOLINE CHLORIDE 200 MG/10ML IV SOSY
PREFILLED_SYRINGE | INTRAVENOUS | Status: DC | PRN
Start: 2017-04-25 — End: 2017-04-25
  Administered 2017-04-25: 100 mg via INTRAVENOUS

## 2017-04-25 MED ORDER — ONDANSETRON HCL 4 MG/2ML IJ SOLN: 4.0000 mg | Freq: Once | INTRAMUSCULAR | Status: DC | PRN

## 2017-04-25 MED ORDER — METHOHEXITAL SODIUM 100 MG/10ML IV SOSY
PREFILLED_SYRINGE | INTRAVENOUS | Status: DC | PRN
  Administered 2017-04-25: 70 mg via INTRAVENOUS

## 2017-04-25 MED ORDER — SUCCINYLCHOLINE CHLORIDE 20 MG/ML IJ SOLN
INTRAMUSCULAR | Status: AC
  Filled 2017-04-25: qty 1

## 2017-04-25 MED ORDER — LABETALOL HCL 5 MG/ML IV SOLN
INTRAVENOUS | Status: AC
  Filled 2017-04-25: qty 4

## 2017-04-25 MED ORDER — SODIUM CHLORIDE 0.9 % IV SOLN
500.0000 mL | Freq: Once | INTRAVENOUS | Status: AC
  Administered 2017-04-25: 10:00:00 via INTRAVENOUS

## 2017-04-25 MED ORDER — KETOROLAC TROMETHAMINE 30 MG/ML IJ SOLN
INTRAMUSCULAR | Status: AC
  Administered 2017-04-25: 30 mg via INTRAVENOUS
  Filled 2017-04-25: qty 1

## 2017-04-25 MED ORDER — METHOHEXITAL SODIUM 0.5 G IJ SOLR
INTRAMUSCULAR | Status: AC
  Filled 2017-04-25: qty 500

## 2017-04-25 MED ORDER — FENTANYL CITRATE (PF) 100 MCG/2ML IJ SOLN: 25.0000 ug | INTRAMUSCULAR | Status: DC | PRN

## 2017-04-25 MED ORDER — GLYCOPYRROLATE 0.2 MG/ML IJ SOLN
INTRAMUSCULAR | Status: AC
  Administered 2017-04-25: 0.4 mg via INTRAVENOUS
  Filled 2017-04-25: qty 2

## 2017-04-25 MED ORDER — ESMOLOL HCL 100 MG/10ML IV SOLN
INTRAVENOUS | Status: DC | PRN
  Administered 2017-04-25: 20 mg via INTRAVENOUS

## 2017-04-25 NOTE — H&P (Signed)
Melinda [name] is an 46 y.o. female.   Chief Complaint: Feeling a little worse this week situationally related to her son HPI: Patient with chronic recurrent depression who is stable with maintenance ECT  Past Medical History:  Diagnosis Date  . Depression   . Diabetes mellitus without complication ([place])   . Diabetic peripheral neuropathy ([place]) 03/07/15  . Diabetic peripheral neuropathy ([place]) 03/07/15  . Diabetic peripheral neuropathy ([place]) 03/07/15  . GERD (gastroesophageal reflux disease)   . Hypercholesterolemia 03/07/15  . Hypertension   . Obesity 03/07/15  . Personality disorder 03/07/15  . Sinus tachycardia 03/07/15   history of  . Suicidal thoughts 03/07/15    Past Surgical History:  Procedure Laterality Date  . electroconvulsion therapy  03/07/15    Family History  Problem Relation Age of Onset  . Hypertension Father   . Diabetes Mother    Social History:  reports that she has never smoked. She has never used smokeless tobacco. She reports that she does not drink alcohol or use drugs.  Allergies:  Allergies  Allergen Reactions  . Prednisone     Increases blood sugar     (Not in a hospital admission)  Results for orders placed or performed during the hospital encounter of 04/25/17 (from the past 48 hour(s))  Glucose, capillary     Status: None   Collection Time: 04/25/17  8:19 AM  Result Value Ref Range   Glucose-Capillary 96 65 - 99 mg/dL   No results found.  Review of Systems  Constitutional: Negative.   HENT: Negative.   Eyes: Negative.   Respiratory: Negative.   Cardiovascular: Negative.   Gastrointestinal: Negative.   Musculoskeletal: Negative.   Skin: Negative.   Neurological: Negative.   Psychiatric/Behavioral: Negative.     Blood pressure (!) 151/73, pulse (!) 103, temperature 97.9 F (36.6 C), temperature source Oral, resp. rate 18, height 5\' 5"  (1.651 m), weight 107 kg (236 lb), last menstrual period 03/26/2017, SpO2 100 %. Physical Exam   Nursing note and vitals reviewed. Constitutional: She appears well-developed and well-nourished.  HENT:  Head: Normocephalic and atraumatic.  Eyes: Conjunctivae are normal. Pupils are equal, round, and reactive to light.  Neck: Normal range of motion.  Cardiovascular: Regular rhythm and normal heart sounds.   Respiratory: Effort normal. No respiratory distress.  GI: Soft.  Musculoskeletal: Normal range of motion.  Neurological: She is alert.  Skin: Skin is warm and dry.  Psychiatric: She has a normal mood and affect. Her behavior is normal. Judgment and thought content normal.     Assessment/Plan Supportive therapy review of plan treatment today follow-up one week.  [name], MD 04/25/2017, 10:12 AM

## 2017-04-25 NOTE — Anesthesia Postprocedure Evaluation (Signed)
Anesthesia Post Note  Patient: Melinda [name]  Procedure(s) Performed: * No procedures listed *  Patient location during evaluation: PACU Anesthesia Type: General Level of consciousness: awake and alert and oriented Pain management: pain level controlled Vital Signs Assessment: post-procedure vital signs reviewed and stable Respiratory status: spontaneous breathing Cardiovascular status: blood pressure returned to baseline Anesthetic complications: no     Last Vitals:  Vitals:   04/25/17 1051 04/25/17 1105  BP: 115/64 133/71  Pulse: 88 83  Resp: (!) 22 16  Temp:      Last Pain:  Vitals:   04/25/17 1105  TempSrc:   PainSc: 2                  [name]

## 2017-04-25 NOTE — Anesthesia Post-op Follow-up Note (Cosign Needed)
Anesthesia QCDR form completed.        

## 2017-04-25 NOTE — Discharge Instructions (Signed)
1)  The drugs that you have been given will stay in your system until tomorrow so for the       next 24 hours you should not:  A. Drive an automobile  B. Make any legal decisions  C. Drink any alcoholic beverages  2)  You may resume your regular meals upon return home.  3)  A responsible adult must take you home.  Someone should stay with you for a few          hours, then be available by phone for the remainder of the treatment day.  4)  You May experience any of the following symptoms:  Headache, Nausea and a dry mouth (due to the medications you were given),  temporary memory loss and some confusion, or sore muscles (a warm bath  should help this).  If you you experience any of these symptoms let us know on                your return visit.  5)  Report any of the following: any acute discomfort, severe headache, or temperature        greater than 100.5 F.   Also report any unusual redness, swelling, drainage, or pain         at your IV site.    You may report Symptoms to:  [place] at [facility]          Phone: [phone], ECT Department           or Dr. Prescott Gum office [phone]  6)  Your next ECT Treatment is Day Friday Date June 22 at 800am  We will call 2 days prior to your scheduled appointment for arrival times.  7)  Nothing to eat or drink after midnight the night before your procedure.  8)  Take .    With a sip of water the morning of your procedure.  9)  Other Instructions: Call [phone] to cancel the morning of your procedure due         to illness or emergency.  10) We will call within 72 hours to assess how you are feeling.

## 2017-04-25 NOTE — Anesthesia Procedure Notes (Signed)
Date/Time: 04/25/2017 10:24 AM Performed by: Dionne Bucy Pre-anesthesia Checklist: Patient identified, Emergency Drugs available, Suction available and Patient being monitored Patient Re-evaluated:Patient Re-evaluated prior to inductionOxygen Delivery Method: Circle system utilized Preoxygenation: Pre-oxygenation with 100% oxygen Intubation Type: IV induction Ventilation: Mask ventilation without difficulty and Mask ventilation throughout procedure Airway Equipment and Method: Bite block Placement Confirmation: positive ETCO2 Dental Injury: Teeth and Oropharynx as per pre-operative assessment

## 2017-04-25 NOTE — Anesthesia Preprocedure Evaluation (Signed)
Anesthesia Evaluation  Patient identified by MRN, date of birth, ID band Patient awake    Reviewed: Allergy & Precautions, H&P , NPO status , Patient's Chart, lab work & pertinent test results  History of Anesthesia Complications Negative for: history of anesthetic complications  Airway Mallampati: II  TM Distance: >3 FB Neck ROM: full    Dental  (+) Poor Dentition, Chipped   Pulmonary sleep apnea , neg COPD,    Pulmonary exam normal breath sounds clear to auscultation       Cardiovascular hypertension, Pt. on medications (-) CAD and (-) Past MI negative cardio ROS Normal cardiovascular exam Rhythm:regular Rate:Normal     Neuro/Psych PSYCHIATRIC DISORDERS Depression Bipolar Disorder  Neuromuscular disease negative neurological ROS     GI/Hepatic negative GI ROS, Neg liver ROS, GERD  Controlled,  Endo/Other  diabetes, Type 2, Oral Hypoglycemic Agents  Renal/GU negative Renal ROS  negative genitourinary   Musculoskeletal   Abdominal (+) + obese,   Peds  Hematology negative hematology ROS (+)   Anesthesia Other Findings Past Medical History: No date: Depression No date: Diabetes mellitus without complication ([facility]) 03/07/15: Diabetic peripheral neuropathy ([facility]) 03/07/15: Diabetic peripheral neuropathy ([facility]) 03/07/15: Diabetic peripheral neuropathy ([facility]) No date: GERD (gastroesophageal reflux disease) 03/07/15: Hypercholesterolemia No date: Hypertension 03/07/15: Obesity 03/07/15: Personality disorder 03/07/15: Sinus tachycardia ([facility])     Comment: history of 03/07/15: Suicidal thoughts Past Surgical History: 03/07/15: electroconvulsion therapy BMI    Body Mass Index:  36.44 kg/m     Reproductive/Obstetrics                             Anesthesia Physical  Anesthesia Plan  ASA: III  Anesthesia Plan: General   Post-op Pain Management:    Induction: Intravenous  PONV Risk Score and  Plan:   Airway Management Planned: Mask  Additional Equipment:   Intra-op Plan:   Post-operative Plan:   Informed Consent: I have reviewed the patients History and Physical, chart, labs and discussed the procedure including the risks, benefits and alternatives for the proposed anesthesia with the patient or authorized representative who has indicated his/her understanding and acceptance.   Dental Advisory Given  Plan Discussed with: CRNA and Anesthesiologist  Anesthesia Plan Comments: (Patient consented for risks of anesthesia including but not limited to:  - adverse reactions to medications - damage to teeth, lips or other oral mucosa - sore throat or hoarseness - Damage to heart, brain, lungs or loss of life  Patient voiced understanding.)        Anesthesia Quick Evaluation  

## 2017-04-25 NOTE — Transfer of Care (Addendum)
Immediate Anesthesia Transfer of Care Note  Patient: Melinda [name]  Procedure(s) Performed: ECT  Patient Location: PACU  Anesthesia Type:General  Level of Consciousness: sedated  Airway & Oxygen Therapy: Patient Spontanous Breathing  Post-op Assessment: Report given to RN and Post -op Vital signs reviewed and stable  Post vital signs: Reviewed and stable  Last Vitals:  Vitals:   04/25/17 1032 04/25/17 1042  BP: (!) 120/102 115/79  Pulse: 94 89  Resp: 18 20  Temp: 36.2 C     Last Pain:  Vitals:   04/25/17 1042  TempSrc:   PainSc: 1          Complications: No apparent anesthesia complications

## 2017-04-25 NOTE — Procedures (Signed)
ECT SERVICES Physician's Interval Evaluation & Treatment Note  Patient Identification: SINDEE STUCKER MRN:  [id number] Date of Evaluation:  04/25/2017 TX #: [id number]  MADRS:   MMSE:   P.E. Findings:  No change to physical exam vitals unremarkable heart and lungs normal. No new skin lesion  Psychiatric Interval Note:  Mood has been a little bit more down this week but not suicidal not psychotic  Subjective:  Patient is a 46 y.o. female seen for evaluation for Electroconvulsive Therapy. Patient continues to be stable more or less with current treatment regimen  Treatment Summary:   []   Right Unilateral             [x]  Bilateral   % Energy : 1.0 ms 35%   Impedance: 1290 ohms  Seizure Energy Index: 3830 V squared  Postictal Suppression Index: 93%  Seizure Concordance Index: 94%  Medications  Pre Shock: Robinul 0.4 mg labetalol 20 mg esmolol 10 mg Toradol 30 mg Brevital 70 mg succinylcholine 100 mg  Post Shock:    Seizure Duration: 36 seconds EMG 64 seconds EEG   Comments: Follow-up in 1 week   Lungs:  [x]   Clear to auscultation               []  Other:   Heart:    [x]   Regular rhythm             []  irregular rhythm    [x]   Previous H&P reviewed, patient examined and there are NO CHANGES                 []   Previous H&P reviewed, patient examined and there are changes noted.   [name], MD 6/15/201810:15 AM

## 2017-05-02 ENCOUNTER — Encounter: Payer: Self-pay | Admitting: Anesthesiology

## 2017-05-02 ENCOUNTER — Encounter
Admission: RE | Admit: 2017-05-02 | Discharge: 2017-05-02 | Disposition: A | Discharge status: Home / Self Care | Payer: Medicare PPO | Source: Ambulatory Visit | Attending: Psychiatry | Admitting: Psychiatry

## 2017-05-02 ENCOUNTER — Other Ambulatory Visit: Payer: Self-pay | Admitting: Psychiatry

## 2017-05-02 DIAGNOSIS — F332 Major depressive disorder, recurrent severe without psychotic features: Secondary | ICD-10-CM

## 2017-05-02 DIAGNOSIS — E1142 Type 2 diabetes mellitus with diabetic polyneuropathy: Secondary | ICD-10-CM | POA: Insufficient documentation

## 2017-05-02 DIAGNOSIS — E669 Obesity, unspecified: Secondary | ICD-10-CM | POA: Diagnosis not present

## 2017-05-02 DIAGNOSIS — E7800 Pure hypercholesterolemia, unspecified: Secondary | ICD-10-CM | POA: Insufficient documentation

## 2017-05-02 DIAGNOSIS — I10 Essential (primary) hypertension: Secondary | ICD-10-CM | POA: Insufficient documentation

## 2017-05-02 DIAGNOSIS — K219 Gastro-esophageal reflux disease without esophagitis: Secondary | ICD-10-CM | POA: Diagnosis not present

## 2017-05-02 DIAGNOSIS — F3189 Other bipolar disorder: Secondary | ICD-10-CM | POA: Diagnosis not present

## 2017-05-02 DIAGNOSIS — E119 Type 2 diabetes mellitus without complications: Secondary | ICD-10-CM | POA: Diagnosis not present

## 2017-05-02 DIAGNOSIS — Z6841 Body Mass Index (BMI) 40.0 and over, adult: Secondary | ICD-10-CM | POA: Diagnosis not present

## 2017-05-02 DIAGNOSIS — F339 Major depressive disorder, recurrent, unspecified: Secondary | ICD-10-CM | POA: Diagnosis not present

## 2017-05-02 LAB — POCT PREGNANCY, URINE: Preg Test, Ur: NEGATIVE

## 2017-05-02 LAB — GLUCOSE, CAPILLARY: Glucose-Capillary: 90 mg/dL (ref 65–99)

## 2017-05-02 IMAGING — MG MM DIGITAL SCREENING BILAT W/ CAD
5 series · 5 of 5 positions shown · non-contrast
Comparison: None.

CLINICAL DATA: Screening. Baseline exam.

EXAM:
DIGITAL SCREENING BILATERAL MAMMOGRAM WITH CAD

[R CC]
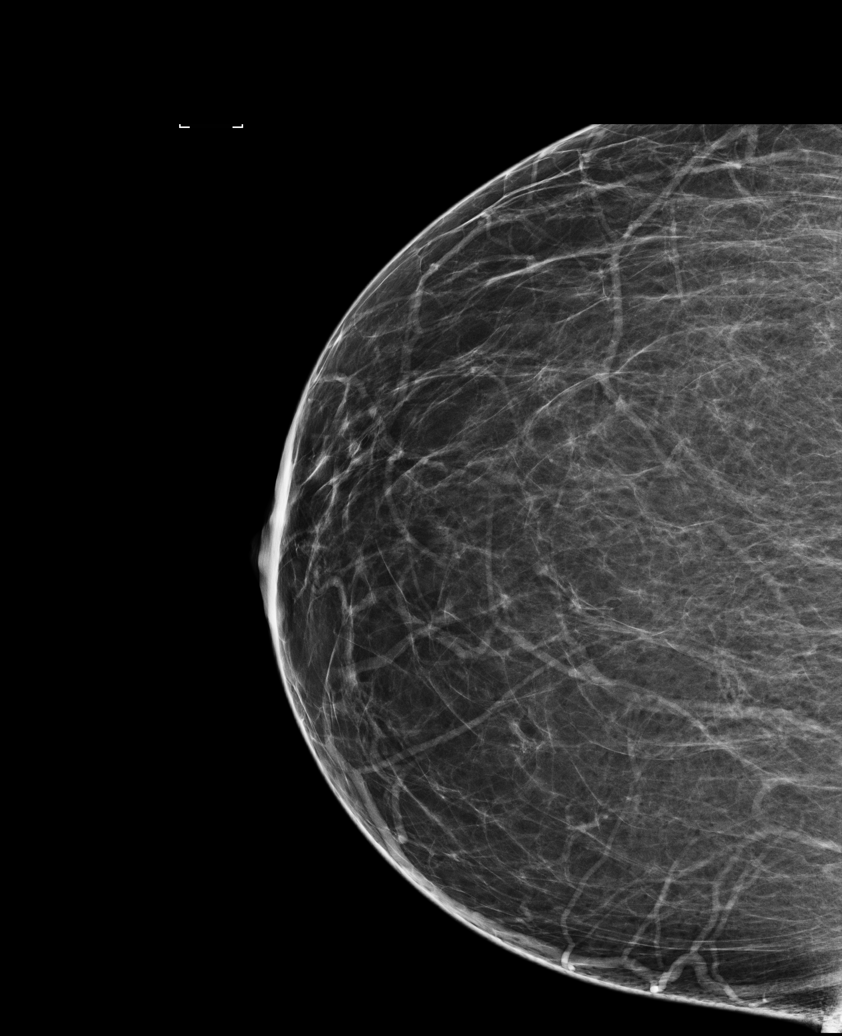

[L MLO]
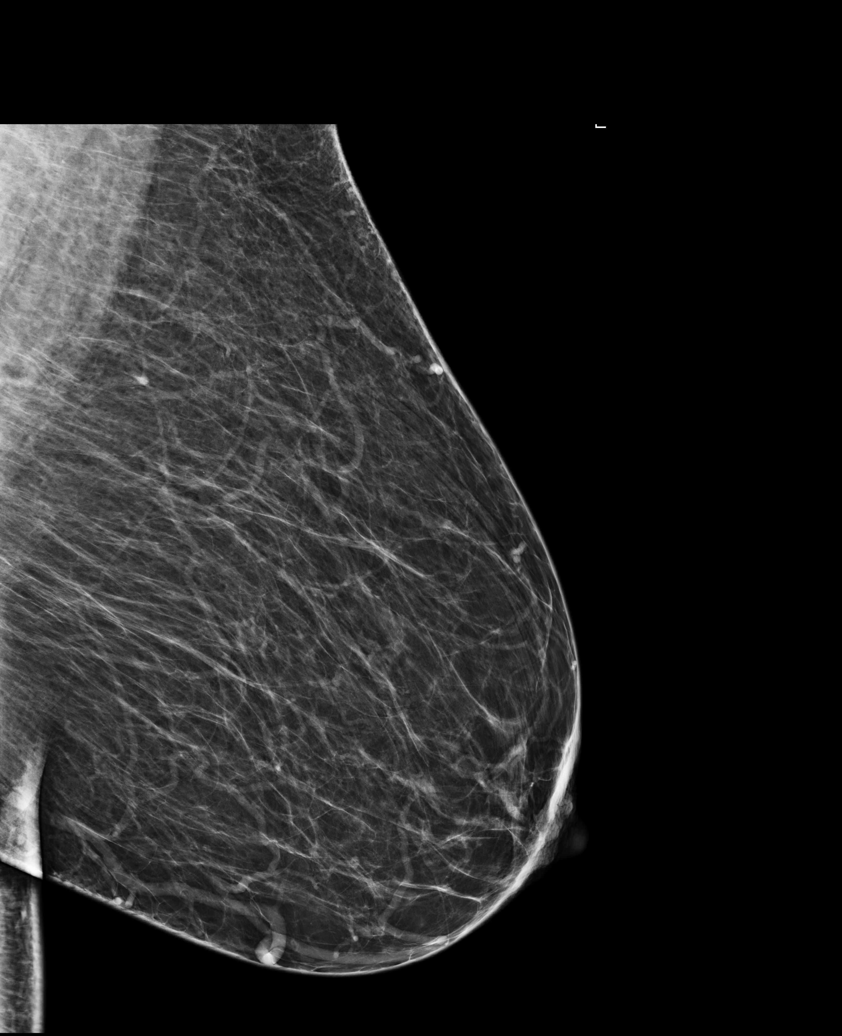

[L CC]
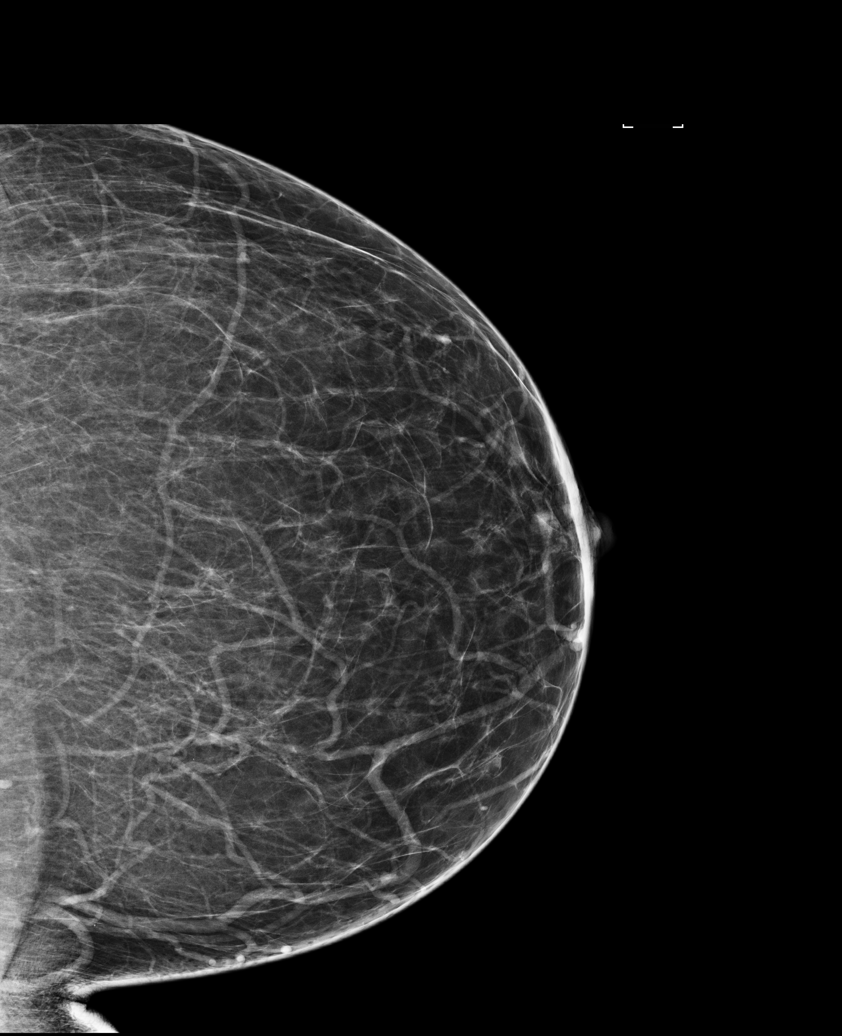

[R MLO (1 of 2)]
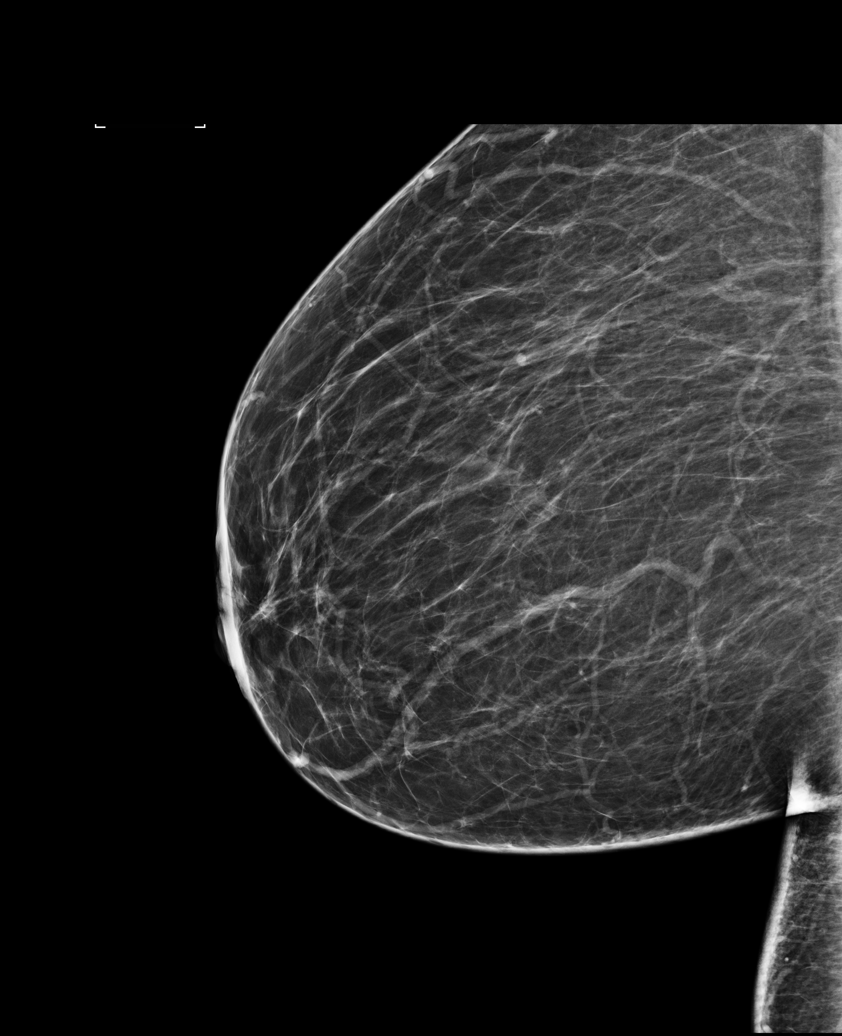

[R MLO (2 of 2)]
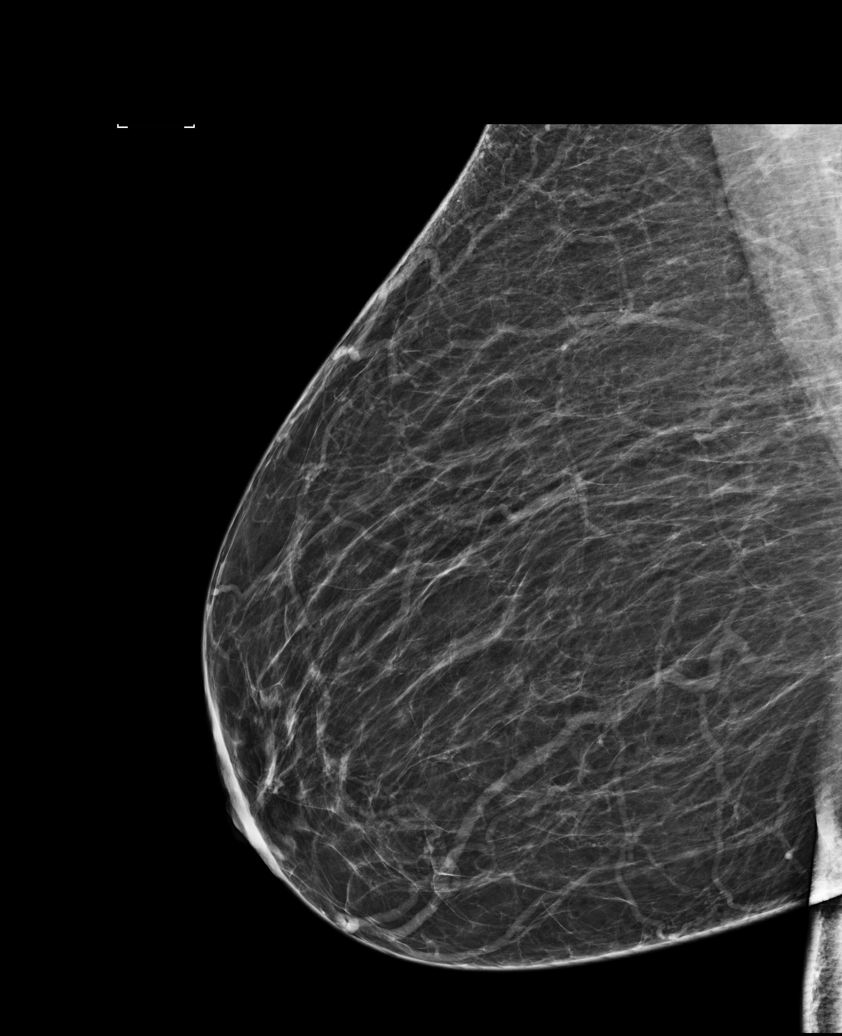

[5 of 5 positions shown; findings below may reference images not displayed]

ACR Breast Density Category b: There are scattered areas of
fibroglandular density.
FINDINGS: There are no findings suspicious for malignancy. Images were
processed with CAD.
IMPRESSION: No mammographic evidence of malignancy. A result letter of this
screening mammogram will be mailed directly to the patient.

RECOMMENDATION:
Screening mammogram in one year. (Code:SS-O-A03)

BI-RADS CATEGORY  1: Negative.

## 2017-05-02 MED ORDER — SUCCINYLCHOLINE CHLORIDE 200 MG/10ML IV SOSY
PREFILLED_SYRINGE | INTRAVENOUS | Status: DC | PRN
  Administered 2017-05-02: 100 mg via INTRAVENOUS

## 2017-05-02 MED ORDER — LABETALOL HCL 5 MG/ML IV SOLN
INTRAVENOUS | Status: DC | PRN
  Administered 2017-05-02: 20 mg via INTRAVENOUS

## 2017-05-02 MED ORDER — ESMOLOL HCL 100 MG/10ML IV SOLN
INTRAVENOUS | Status: AC
  Filled 2017-05-02: qty 10

## 2017-05-02 MED ORDER — METHOHEXITAL SODIUM 100 MG/10ML IV SOSY
PREFILLED_SYRINGE | INTRAVENOUS | Status: DC | PRN
  Administered 2017-05-02: 70 mg via INTRAVENOUS

## 2017-05-02 MED ORDER — GLYCOPYRROLATE 0.2 MG/ML IJ SOLN
INTRAMUSCULAR | Status: AC
  Administered 2017-05-02: 0.4 mg via INTRAVENOUS
  Filled 2017-05-02: qty 2

## 2017-05-02 MED ORDER — ESMOLOL HCL 100 MG/10ML IV SOLN
INTRAVENOUS | Status: DC | PRN
  Administered 2017-05-02: 20 mg via INTRAVENOUS

## 2017-05-02 MED ORDER — SODIUM CHLORIDE 0.9 % IV SOLN
INTRAVENOUS | Status: DC | PRN
  Administered 2017-05-02: 10:00:00 via INTRAVENOUS

## 2017-05-02 MED ORDER — KETOROLAC TROMETHAMINE 30 MG/ML IJ SOLN
30.0000 mg | Freq: Once | INTRAMUSCULAR | Status: AC
  Administered 2017-05-02: 30 mg via INTRAVENOUS

## 2017-05-02 MED ORDER — SODIUM CHLORIDE 0.9 % IV SOLN
500.0000 mL | Freq: Once | INTRAVENOUS | Status: AC
  Administered 2017-05-02: 500 mL via INTRAVENOUS

## 2017-05-02 MED ORDER — LABETALOL HCL 5 MG/ML IV SOLN
INTRAVENOUS | Status: AC
  Filled 2017-05-02: qty 4

## 2017-05-02 MED ORDER — SUCCINYLCHOLINE CHLORIDE 20 MG/ML IJ SOLN
INTRAMUSCULAR | Status: AC
  Filled 2017-05-02: qty 1

## 2017-05-02 MED ORDER — GLYCOPYRROLATE 0.2 MG/ML IJ SOLN
0.4000 mg | Freq: Once | INTRAMUSCULAR | Status: AC
  Administered 2017-05-02: 0.4 mg via INTRAVENOUS

## 2017-05-02 MED ORDER — KETOROLAC TROMETHAMINE 30 MG/ML IJ SOLN
INTRAMUSCULAR | Status: AC
Start: 2017-05-02 — End: 2017-05-02
  Administered 2017-05-02: 30 mg via INTRAVENOUS
  Filled 2017-05-02: qty 1

## 2017-05-02 NOTE — Anesthesia Procedure Notes (Signed)
Date/Time: 05/02/2017 10:51 AM Performed by: Dionne Bucy Pre-anesthesia Checklist: Patient identified, Emergency Drugs available, Suction available and Patient being monitored Patient Re-evaluated:Patient Re-evaluated prior to inductionOxygen Delivery Method: Circle system utilized Preoxygenation: Pre-oxygenation with 100% oxygen Intubation Type: IV induction Ventilation: Mask ventilation without difficulty and Mask ventilation throughout procedure Airway Equipment and Method: Bite block Placement Confirmation: positive ETCO2 Dental Injury: Teeth and Oropharynx as per pre-operative assessment

## 2017-05-02 NOTE — Progress Notes (Signed)
Total fluids 200

## 2017-05-02 NOTE — Anesthesia Preprocedure Evaluation (Signed)
Anesthesia Evaluation  Patient identified by MRN, date of birth, ID band Patient awake    Reviewed: Allergy & Precautions, H&P , NPO status , Patient's Chart, lab work & pertinent test results  History of Anesthesia Complications Negative for: history of anesthetic complications  Airway Mallampati: II  TM Distance: >3 FB Neck ROM: full    Dental  (+) Poor Dentition, Chipped   Pulmonary sleep apnea , neg COPD,    Pulmonary exam normal breath sounds clear to auscultation       Cardiovascular hypertension, Pt. on medications (-) CAD and (-) Past MI negative cardio ROS Normal cardiovascular exam Rhythm:regular Rate:Normal     Neuro/Psych PSYCHIATRIC DISORDERS Depression Bipolar Disorder  Neuromuscular disease negative neurological ROS     GI/Hepatic negative GI ROS, Neg liver ROS, GERD  Medicated,  Endo/Other  diabetes, Type 2, Oral Hypoglycemic Agents  Renal/GU negative Renal ROS  negative genitourinary   Musculoskeletal   Abdominal (+) + obese,   Peds  Hematology negative hematology ROS (+)   Anesthesia Other Findings Past Medical History: No date: Depression No date: Diabetes mellitus without complication ([facility]) 03/07/15: Diabetic peripheral neuropathy ([facility]) 03/07/15: Diabetic peripheral neuropathy ([facility]) 03/07/15: Diabetic peripheral neuropathy ([facility]) No date: GERD (gastroesophageal reflux disease) 03/07/15: Hypercholesterolemia No date: Hypertension 03/07/15: Obesity 03/07/15: Personality disorder 03/07/15: Sinus tachycardia ([facility])     Comment: history of 03/07/15: Suicidal thoughts Past Surgical History: 03/07/15: electroconvulsion therapy BMI    Body Mass Index:  36.44 kg/m     Reproductive/Obstetrics                             Anesthesia Physical  Anesthesia Plan  ASA: III  Anesthesia Plan: General   Post-op Pain Management:    Induction: Intravenous  PONV Risk Score and  Plan: 2 and Ondansetron  Airway Management Planned: Mask  Additional Equipment:   Intra-op Plan:   Post-operative Plan:   Informed Consent: I have reviewed the patients History and Physical, chart, labs and discussed the procedure including the risks, benefits and alternatives for the proposed anesthesia with the patient or authorized representative who has indicated his/her understanding and acceptance.     Dental Advisory Given  Plan Discussed with: CRNA and Anesthesiologist  Anesthesia Plan Comments:         Anesthesia Quick Evaluation  

## 2017-05-02 NOTE — Transfer of Care (Signed)
Immediate Anesthesia Transfer of Care Note  Patient: Melinda [name]  Procedure(s) Performed: ECT  Patient Location: PACU  Anesthesia Type:General  Level of Consciousness: sedated  Airway & Oxygen Therapy: Patient Spontanous Breathing  Post-op Assessment: Report given to RN and Post -op Vital signs reviewed and stable  Post vital signs: Reviewed and stable  Last Vitals:  Vitals:   05/02/17 0834 05/02/17 1101  BP: (!) 138/55 (!) 104/57  Pulse: 91 99  Resp: 16 (!) 25  Temp: 36.7 C 37.3 C    Last Pain:  Vitals:   05/02/17 0834  TempSrc: Oral         Complications: No apparent anesthesia complications

## 2017-05-02 NOTE — Procedures (Signed)
ECT SERVICES Physician's Interval Evaluation & Treatment Note  Patient Identification: Melinda [name] MRN:  [id number] Date of Evaluation:  05/02/2017 TX #: [id number]  MADRS:   MMSE:   P.E. Findings:  Heart and lungs normal. No skin changes. Vitals normal.  Psychiatric Interval Note:  Patient's mood is a little bit more down this week  Subjective:  Patient is a 46 y.o. female seen for evaluation for Electroconvulsive Therapy. A little down. Not suicidal.  Treatment Summary:   []   Right Unilateral             [x]  Bilateral   % Energy : 1.0 ms 35%   Impedance: 2050 ohms  Seizure Energy Index: 26,679 V squared  Postictal Suppression Index: Not red  Seizure Concordance Index: 51%  Medications  Pre Shock: Robinul 0.4 mg labetalol 20 mg Toradol 30 mg esmolol 10 mg Brevital 70 mg succinylcholine 100 mg  Post Shock:    Seizure Duration: 35 seconds by EMG 62 seconds by EEG   Comments: Seizure was of good quality there was just some poor reading on one of the leads. We will be seeing her back in 2 weeks that I'm going to see if we can't convince her to start coming in every other week after that.   Lungs:  [x]   Clear to auscultation               []  Other:   Heart:    [x]   Regular rhythm             []  irregular rhythm    [x]   Previous H&P reviewed, patient examined and there are NO CHANGES                 []   Previous H&P reviewed, patient examined and there are changes noted.   [name], MD 6/22/201810:43 AM

## 2017-05-02 NOTE — H&P (Signed)
Melinda [name] is an 46 y.o. female.   Chief Complaint: Feeling more down and depressed today HPI: History of recurrent severe depression  Past Medical History:  Diagnosis Date  . Depression   . Diabetes mellitus without complication ([place])   . Diabetic peripheral neuropathy ([place]) 03/07/15  . Diabetic peripheral neuropathy ([place]) 03/07/15  . Diabetic peripheral neuropathy ([place]) 03/07/15  . GERD (gastroesophageal reflux disease)   . Hypercholesterolemia 03/07/15  . Hypertension   . Obesity 03/07/15  . Personality disorder 03/07/15  . Sinus tachycardia 03/07/15   history of  . Suicidal thoughts 03/07/15    Past Surgical History:  Procedure Laterality Date  . electroconvulsion therapy  03/07/15    Family History  Problem Relation Age of Onset  . Hypertension Father   . Diabetes Mother    Social History:  reports that she has never smoked. She has never used smokeless tobacco. She reports that she does not drink alcohol or use drugs.  Allergies:  Allergies  Allergen Reactions  . Prednisone     Increases blood sugar     (Not in a hospital admission)  Results for orders placed or performed during the hospital encounter of 05/02/17 (from the past 48 hour(s))  Pregnancy, urine POC     Status: None   Collection Time: 05/02/17  8:54 AM  Result Value Ref Range   Preg Test, Ur NEGATIVE NEGATIVE    Comment:        THE SENSITIVITY OF THIS METHODOLOGY IS >24 mIU/mL   Glucose, capillary     Status: None   Collection Time: 05/02/17  9:04 AM  Result Value Ref Range   Glucose-Capillary 90 65 - 99 mg/dL   No results found.  Review of Systems  Constitutional: Negative.   HENT: Negative.   Eyes: Negative.   Respiratory: Negative.   Cardiovascular: Negative.   Gastrointestinal: Negative.   Musculoskeletal: Negative.   Skin: Negative.   Neurological: Negative.   Psychiatric/Behavioral: Positive for depression. Negative for hallucinations, memory loss, substance abuse and suicidal  ideas. The patient is not nervous/anxious and does not have insomnia.     Blood pressure (!) 138/55, pulse 91, temperature 98.1 F (36.7 C), temperature source Oral, resp. rate 16, height 5\' 5"  (1.651 m), weight 242 lb (109.8 kg), SpO2 100 %. Physical Exam  Nursing note and vitals reviewed. Constitutional: She appears well-developed and well-nourished.  HENT:  Head: Normocephalic and atraumatic.  Eyes: Conjunctivae are normal. Pupils are equal, round, and reactive to light.  Neck: Normal range of motion.  Cardiovascular: Regular rhythm and normal heart sounds.   Respiratory: Effort normal. No respiratory distress.  GI: Soft.  Musculoskeletal: Normal range of motion.  Neurological: She is alert.  Skin: Skin is warm and dry.  Psychiatric: Judgment normal. Her affect is blunt. Her speech is delayed. She is slowed. Thought content is not paranoid. Cognition and memory are normal. She exhibits a depressed mood. She expresses no homicidal and no suicidal ideation.     Assessment/Plan We are having treatment today and then we will see her back in 2 weeks. We talked a bit today about trying to start switch to an every other week schedule which I think would probably be a better plan although she is not in favor of it.  [name], MD 05/02/2017, 10:42 AM

## 2017-05-02 NOTE — Discharge Instructions (Signed)
1)  The drugs that you have been given will stay in your system until tomorrow so for the       next 24 hours you should not:  A. Drive an automobile  B. Make any legal decisions  C. Drink any alcoholic beverages  2)  You may resume your regular meals upon return home.  3)  A responsible adult must take you home.  Someone should stay with you for a few          hours, then be available by phone for the remainder of the treatment day.  4)  You May experience any of the following symptoms:  Headache, Nausea and a dry mouth (due to the medications you were given),  temporary memory loss and some confusion, or sore muscles (a warm bath  should help this).  If you you experience any of these symptoms let us know on                your return visit.  5)  Report any of the following: any acute discomfort, severe headache, or temperature        greater than 100.5 F.   Also report any unusual redness, swelling, drainage, or pain         at your IV site.    You may report Symptoms to:  [place] at [facility]          Phone: [phone], ECT Department           or Dr. Prescott Gum office [phone]  6)  Your next ECT Treatment is Day Friday  Date May 16, 2017 at 8am  We will call 2 days prior to your scheduled appointment for arrival times.  7)  Nothing to eat or drink after midnight the night before your procedure.  8)  Take .     With a sip of water the morning of your procedure.  9)  Other Instructions: Call [phone] to cancel the morning of your procedure due         to illness or emergency.  10) We will call within 72 hours to assess how you are feeling.

## 2017-05-02 NOTE — Anesthesia Postprocedure Evaluation (Signed)
Anesthesia Post Note  Patient: Melinda [name]  Procedure(s) Performed: * No procedures listed *  Patient location during evaluation: PACU Anesthesia Type: General Level of consciousness: awake and alert Pain management: pain level controlled Vital Signs Assessment: post-procedure vital signs reviewed and stable Respiratory status: spontaneous breathing, nonlabored ventilation and respiratory function stable Cardiovascular status: blood pressure returned to baseline and stable Postop Assessment: no signs of nausea or vomiting Anesthetic complications: no     Last Vitals:  Vitals:   05/02/17 1127 05/02/17 1133  BP: 122/87   Pulse: 91 83  Resp: (!) 34 16  Temp: 36.9 C     Last Pain:  Vitals:   05/02/17 0834  TempSrc: Oral                 Jinx Gilden

## 2017-05-02 NOTE — Anesthesia Post-op Follow-up Note (Cosign Needed)
Anesthesia QCDR form completed.        

## 2017-05-12 ENCOUNTER — Telehealth: Payer: Self-pay

## 2017-05-15 ENCOUNTER — Other Ambulatory Visit: Payer: Self-pay | Admitting: Psychiatry

## 2017-05-16 ENCOUNTER — Ambulatory Visit
Admission: RE | Admit: 2017-05-16 | Discharge: 2017-05-16 | Disposition: A | Discharge status: Home / Self Care | Payer: Medicare PPO | Source: Ambulatory Visit | Attending: Psychiatry | Admitting: Psychiatry

## 2017-05-16 ENCOUNTER — Encounter: Payer: Self-pay | Admitting: Anesthesiology

## 2017-05-16 ENCOUNTER — Other Ambulatory Visit: Payer: Self-pay | Admitting: Psychiatry

## 2017-05-16 ENCOUNTER — Telehealth: Payer: Self-pay | Admitting: *Deleted

## 2017-05-16 DIAGNOSIS — Z833 Family history of diabetes mellitus: Secondary | ICD-10-CM | POA: Insufficient documentation

## 2017-05-16 DIAGNOSIS — Z8249 Family history of ischemic heart disease and other diseases of the circulatory system: Secondary | ICD-10-CM | POA: Insufficient documentation

## 2017-05-16 DIAGNOSIS — K219 Gastro-esophageal reflux disease without esophagitis: Secondary | ICD-10-CM | POA: Insufficient documentation

## 2017-05-16 DIAGNOSIS — E1142 Type 2 diabetes mellitus with diabetic polyneuropathy: Secondary | ICD-10-CM | POA: Diagnosis not present

## 2017-05-16 DIAGNOSIS — E7800 Pure hypercholesterolemia, unspecified: Secondary | ICD-10-CM | POA: Diagnosis not present

## 2017-05-16 DIAGNOSIS — F3189 Other bipolar disorder: Secondary | ICD-10-CM | POA: Diagnosis not present

## 2017-05-16 DIAGNOSIS — E119 Type 2 diabetes mellitus without complications: Secondary | ICD-10-CM | POA: Diagnosis not present

## 2017-05-16 DIAGNOSIS — Z888 Allergy status to other drugs, medicaments and biological substances status: Secondary | ICD-10-CM | POA: Insufficient documentation

## 2017-05-16 DIAGNOSIS — F329 Major depressive disorder, single episode, unspecified: Secondary | ICD-10-CM | POA: Diagnosis not present

## 2017-05-16 DIAGNOSIS — F332 Major depressive disorder, recurrent severe without psychotic features: Secondary | ICD-10-CM | POA: Diagnosis not present

## 2017-05-16 DIAGNOSIS — F609 Personality disorder, unspecified: Secondary | ICD-10-CM | POA: Diagnosis not present

## 2017-05-16 DIAGNOSIS — R000 Tachycardia, unspecified: Secondary | ICD-10-CM | POA: Insufficient documentation

## 2017-05-16 DIAGNOSIS — F419 Anxiety disorder, unspecified: Secondary | ICD-10-CM | POA: Insufficient documentation

## 2017-05-16 DIAGNOSIS — E669 Obesity, unspecified: Secondary | ICD-10-CM | POA: Insufficient documentation

## 2017-05-16 DIAGNOSIS — F339 Major depressive disorder, recurrent, unspecified: Secondary | ICD-10-CM | POA: Diagnosis not present

## 2017-05-16 DIAGNOSIS — Z6839 Body mass index (BMI) 39.0-39.9, adult: Secondary | ICD-10-CM | POA: Insufficient documentation

## 2017-05-16 DIAGNOSIS — I10 Essential (primary) hypertension: Secondary | ICD-10-CM | POA: Diagnosis not present

## 2017-05-16 LAB — GLUCOSE, CAPILLARY: Glucose-Capillary: 67 mg/dL (ref 65–99)

## 2017-05-16 MED ORDER — ESCITALOPRAM OXALATE 20 MG PO TABS: 20.0000 mg | ORAL_TABLET | Freq: Every day | ORAL | 0 refills | Status: DC

## 2017-05-16 MED ORDER — SODIUM CHLORIDE 0.9 % IV SOLN
INTRAVENOUS | Status: DC | PRN
  Administered 2017-05-16: 10:00:00 via INTRAVENOUS

## 2017-05-16 MED ORDER — SODIUM CHLORIDE 0.9 % IV SOLN
500.0000 mL | Freq: Once | INTRAVENOUS | Status: AC
  Administered 2017-05-16: 1000 mL via INTRAVENOUS

## 2017-05-16 MED ORDER — LABETALOL HCL 5 MG/ML IV SOLN
INTRAVENOUS | Status: DC | PRN
  Administered 2017-05-16: 20 mg via INTRAVENOUS

## 2017-05-16 MED ORDER — ZIPRASIDONE HCL 80 MG PO CAPS: 80.0000 mg | ORAL_CAPSULE | Freq: Two times a day (BID) | ORAL | 0 refills | Status: DC

## 2017-05-16 MED ORDER — LISINOPRIL 10 MG PO TABS: 10.0000 mg | ORAL_TABLET | Freq: Every day | ORAL | 0 refills | Status: DC

## 2017-05-16 MED ORDER — BUPROPION HCL ER (XL) 300 MG PO TB24: 300.0000 mg | ORAL_TABLET | Freq: Every day | ORAL | 0 refills | Status: DC

## 2017-05-16 MED ORDER — KETOROLAC TROMETHAMINE 30 MG/ML IJ SOLN
INTRAMUSCULAR | Status: AC
  Administered 2017-05-16: 30 mg via INTRAVENOUS
  Filled 2017-05-16: qty 1

## 2017-05-16 MED ORDER — PIOGLITAZONE HCL 30 MG PO TABS: 30.0000 mg | ORAL_TABLET | Freq: Every day | ORAL | 0 refills | Status: DC

## 2017-05-16 MED ORDER — LABETALOL HCL 5 MG/ML IV SOLN
INTRAVENOUS | Status: AC
  Filled 2017-05-16: qty 4

## 2017-05-16 MED ORDER — ESMOLOL HCL 100 MG/10ML IV SOLN
INTRAVENOUS | Status: AC
  Filled 2017-05-16: qty 10

## 2017-05-16 MED ORDER — SUCCINYLCHOLINE CHLORIDE 200 MG/10ML IV SOSY
PREFILLED_SYRINGE | INTRAVENOUS | Status: DC | PRN
  Administered 2017-05-16: 100 mg via INTRAVENOUS

## 2017-05-16 MED ORDER — ESMOLOL HCL 100 MG/10ML IV SOLN
INTRAVENOUS | Status: DC | PRN
  Administered 2017-05-16: 20 mg via INTRAVENOUS

## 2017-05-16 MED ORDER — KETOROLAC TROMETHAMINE 30 MG/ML IJ SOLN
30.0000 mg | Freq: Once | INTRAMUSCULAR | Status: AC
  Administered 2017-05-16: 30 mg via INTRAVENOUS

## 2017-05-16 MED ORDER — GLYCOPYRROLATE 0.2 MG/ML IJ SOLN
0.4000 mg | Freq: Once | INTRAMUSCULAR | Status: AC
  Administered 2017-05-16: 0.4 mg via INTRAVENOUS

## 2017-05-16 MED ORDER — METHOHEXITAL SODIUM 100 MG/10ML IV SOSY
PREFILLED_SYRINGE | INTRAVENOUS | Status: DC | PRN
  Administered 2017-05-16: 30 mg via INTRAVENOUS
  Administered 2017-05-16: 70 mg via INTRAVENOUS

## 2017-05-16 MED ORDER — SUCCINYLCHOLINE CHLORIDE 20 MG/ML IJ SOLN
INTRAMUSCULAR | Status: AC
  Filled 2017-05-16: qty 1

## 2017-05-16 MED ORDER — GLYCOPYRROLATE 0.2 MG/ML IJ SOLN
INTRAMUSCULAR | Status: AC
  Administered 2017-05-16: 0.4 mg via INTRAVENOUS
  Filled 2017-05-16: qty 2

## 2017-05-16 NOTE — Anesthesia Preprocedure Evaluation (Signed)
Anesthesia Evaluation  Patient identified by MRN, date of birth, ID band Patient awake    Reviewed: Allergy & Precautions, NPO status , Patient's Chart, lab work & pertinent test results, reviewed documented beta blocker date and time   Airway Mallampati: III  TM Distance: >3 FB     Dental  (+) Chipped   Pulmonary sleep apnea ,           Cardiovascular hypertension, Pt. on medications      Neuro/Psych PSYCHIATRIC DISORDERS Depression Bipolar Disorder  Neuromuscular disease    GI/Hepatic GERD  Controlled,  Endo/Other  diabetes, Type 2  Renal/GU      Musculoskeletal   Abdominal   Peds  Hematology   Anesthesia Other Findings   Reproductive/Obstetrics                             Anesthesia Physical Anesthesia Plan  ASA: III  Anesthesia Plan: General   Post-op Pain Management:    Induction: Intravenous  PONV Risk Score and Plan:   Airway Management Planned:   Additional Equipment:   Intra-op Plan:   Post-operative Plan:   Informed Consent: I have reviewed the patients History and Physical, chart, labs and discussed the procedure including the risks, benefits and alternatives for the proposed anesthesia with the patient or authorized representative who has indicated his/her understanding and acceptance.     Plan Discussed with: CRNA  Anesthesia Plan Comments:         Anesthesia Quick Evaluation  

## 2017-05-16 NOTE — Discharge Instructions (Signed)
1)  The drugs that you have been given will stay in your system until tomorrow so for the       next 24 hours you should not:  A. Drive an automobile  B. Make any legal decisions  C. Drink any alcoholic beverages  2)  You may resume your regular meals upon return home.  3)  A responsible adult must take you home.  Someone should stay with you for a few hours, then be available by phone for the remainder of the treatment day.  4)  You May experience any of the following symptoms:  Headache, Nausea and a dry mouth (due to the medications you were given) temporary memory loss and some confusion, or sore muscles (a warm bath should help this).  If you you experience any of these symptoms let us know on  your return visit.  5)  Report any of the following: any acute discomfort, severe headache, or temperature greater than 100.5 F.   Also report any unusual redness, swelling, drainage, or pain at your IV site.    You may report Symptoms to:  [place] [facility]          Phone: [phone], ECT Department           or Dr. Prescott Gum office [phone]  6)  Your next ECT Treatment is Friday, May 30, 2017  We will call 2 days prior to your scheduled appointment for arrival times.  7)  Nothing to eat or drink after midnight the night before your procedure.  8)  Take no medication on the morning of your procedure.      9)  Other Instructions: Call [phone] to cancel the morning of your procedure due to illness or emergency.  10) We will call within 72 hours to assess how you are feeling.

## 2017-05-16 NOTE — H&P (Signed)
Melinda [name] is an 46 y.o. female.   Chief Complaint: Patient had some cutting in the last week. Overall however does not appear to be severely depressed. She actually conversed more alertly been I'm use to some time. HPI: Anxiety and mood health and downs as usual.  Past Medical History:  Diagnosis Date  . Depression   . Diabetes mellitus without complication ([place])   . Diabetic peripheral neuropathy ([place]) 03/07/15  . Diabetic peripheral neuropathy ([place]) 03/07/15  . Diabetic peripheral neuropathy ([place]) 03/07/15  . GERD (gastroesophageal reflux disease)   . Hypercholesterolemia 03/07/15  . Hypertension   . Obesity 03/07/15  . Personality disorder 03/07/15  . Sinus tachycardia 03/07/15   history of  . Suicidal thoughts 03/07/15    Past Surgical History:  Procedure Laterality Date  . electroconvulsion therapy  03/07/15    Family History  Problem Relation Age of Onset  . Hypertension Father   . Diabetes Mother    Social History:  reports that she has never smoked. She has never used smokeless tobacco. She reports that she does not drink alcohol or use drugs.  Allergies:  Allergies  Allergen Reactions  . Prednisone     Increases blood sugar     (Not in a hospital admission)  Results for orders placed or performed during the hospital encounter of 05/16/17 (from the past 48 hour(s))  Glucose, capillary     Status: None   Collection Time: 05/16/17  8:33 AM  Result Value Ref Range   Glucose-Capillary 67 65 - 99 mg/dL   No results found.  Review of Systems  Constitutional: Negative.   HENT: Negative.   Eyes: Negative.   Respiratory: Negative.   Cardiovascular: Negative.   Gastrointestinal: Negative.   Musculoskeletal: Negative.   Skin: Negative.   Neurological: Negative.   Psychiatric/Behavioral: Negative for depression, hallucinations, memory loss, substance abuse and suicidal ideas. The patient is not nervous/anxious and does not have insomnia.     Blood pressure  (!) 146/89, pulse 96, temperature 97.9 F (36.6 C), temperature source Oral, resp. rate 18, height 5\' 5"  (1.651 m), weight 108.9 kg (240 lb), last menstrual period 05/12/2017, SpO2 100 %. Physical Exam  Nursing note and vitals reviewed. Constitutional: She appears well-developed and well-nourished.  HENT:  Head: Normocephalic and atraumatic.  Eyes: Conjunctivae are normal. Pupils are equal, round, and reactive to light.  Neck: Normal range of motion.  Cardiovascular: Regular rhythm and normal heart sounds.   Respiratory: Effort normal. No respiratory distress.  GI: Soft.  Musculoskeletal: Normal range of motion.  Neurological: She is alert.  Skin: Skin is warm and dry.     Psychiatric: She has a normal mood and affect. Her speech is normal and behavior is normal. Judgment and thought content normal. Cognition and memory are normal.     Assessment/Plan I have proposed and am planning that we will try switching to every 2 week treatment for now. I would like to give this a try for at least a month. I suspect that the ECT actually may be keeping her cognitively impaired more than is necessary and that every 2 week should be adequate for mood stability. Patient is very anxious about this. She knows we are still available. We will have treatment today in follow-up in 2 weeks.  [name], MD 05/16/2017, 10:32 AM

## 2017-05-16 NOTE — Anesthesia Postprocedure Evaluation (Signed)
Anesthesia Post Note  Patient: Melinda [name]  Procedure(s) Performed: * No procedures listed *  Patient location during evaluation: PACU Anesthesia Type: General Level of consciousness: awake and alert Pain management: pain level controlled Vital Signs Assessment: post-procedure vital signs reviewed and stable Respiratory status: spontaneous breathing, nonlabored ventilation, respiratory function stable and patient connected to nasal cannula oxygen Cardiovascular status: blood pressure returned to baseline and stable Postop Assessment: no signs of nausea or vomiting Anesthetic complications: no     Last Vitals:  Vitals:   05/16/17 1135 05/16/17 1150  BP: (!) 143/85 136/86  Pulse: 75 71  Resp: 19   Temp:      Last Pain:  Vitals:   05/16/17 1150  TempSrc: Oral  PainSc:                  Seng Fouts S

## 2017-05-16 NOTE — Anesthesia Post-op Follow-up Note (Cosign Needed)
Anesthesia QCDR form completed.        

## 2017-05-16 NOTE — Transfer of Care (Signed)
Immediate Anesthesia Transfer of Care Note  Patient: Melinda [name]  Procedure(s) Performed: ECT  Patient Location: PACU  Anesthesia Type:General  Level of Consciousness: sedated  Airway & Oxygen Therapy: Patient Spontanous Breathing  Post-op Assessment: Report given to RN and Post -op Vital signs reviewed and stable  Post vital signs: Reviewed and stable  Last Vitals:  Vitals:   05/16/17 0850 05/16/17 1055  BP: (!) 146/89   Pulse: 96   Resp: 18   Temp: 36.6 C (P) 36.6 C    Last Pain:  Vitals:   05/16/17 0850  TempSrc: Oral         Complications: No apparent anesthesia complications

## 2017-05-16 NOTE — Anesthesia Procedure Notes (Signed)
Date/Time: 05/16/2017 10:43 AM Performed by: Dionne Bucy Pre-anesthesia Checklist: Patient identified, Emergency Drugs available, Suction available and Patient being monitored Patient Re-evaluated:Patient Re-evaluated prior to inductionOxygen Delivery Method: Circle system utilized Preoxygenation: Pre-oxygenation with 100% oxygen Intubation Type: IV induction Ventilation: Mask ventilation without difficulty and Mask ventilation throughout procedure Airway Equipment and Method: Bite block Placement Confirmation: positive ETCO2 Dental Injury: Teeth and Oropharynx as per pre-operative assessment

## 2017-05-16 NOTE — Procedures (Signed)
ECT SERVICES Physician's Interval Evaluation & Treatment Note  Patient Identification: Melinda [name] MRN:  [id number] Date of Evaluation:  05/16/2017 TX #: [id number]  MADRS:   MMSE:   P.E. Findings:  No change physical exam except for a small superficial scratch on the left forearm.  Psychiatric Interval Note:  No real change to her mood. I think her affect is a little more alert and interactive initially. Still anxiety related to her family.  Subjective:  Patient is a 46 y.o. female seen for evaluation for Electroconvulsive Therapy. No really clear change  Treatment Summary:   []   Right Unilateral             [x]  Bilateral   % Energy : 1.0 ms 35%   Impedance: 1620 ohms  Seizure Energy Index: 6440 V squared  Postictal Suppression Index: 75%  Seizure Concordance Index: 98%  Medications  Pre Shock: Robinul 0.4 mg labetalol 20 mg Toradol 30 mg esmolol 10 mg Brevital 100 mg, succinylcholine 100 mg  Post Shock:    Seizure Duration: 44 seconds by EMG 74 seconds by EEG   Comments: For some reason she did not appear to be getting adequate sedation from the usual 70 mg of Brevital so we added 30 more. We will continue with 70 mg at the next treatment. I am going to try to switch her to every 2 week treatments to decrease cognitive side effects and improve her functioning. Follow-up in 2 weeks.   Lungs:  [x]   Clear to auscultation               []  Other:   Heart:    [x]   Regular rhythm             []  irregular rhythm    [x]   Previous H&P reviewed, patient examined and there are NO CHANGES                 []   Previous H&P reviewed, patient examined and there are changes noted.   [name], MD 7/6/201810:34 AM

## 2017-05-27 DIAGNOSIS — E782 Mixed hyperlipidemia: Secondary | ICD-10-CM | POA: Diagnosis not present

## 2017-05-27 DIAGNOSIS — E119 Type 2 diabetes mellitus without complications: Secondary | ICD-10-CM | POA: Diagnosis not present

## 2017-05-28 ENCOUNTER — Telehealth: Payer: Self-pay | Admitting: *Deleted

## 2017-05-29 ENCOUNTER — Other Ambulatory Visit: Payer: Self-pay | Admitting: Psychiatry

## 2017-05-30 ENCOUNTER — Encounter
Admission: RE | Admit: 2017-05-30 | Discharge: 2017-05-30 | Disposition: A | Discharge status: Home / Self Care | Payer: Medicare PPO | Source: Ambulatory Visit | Attending: Psychiatry | Admitting: Psychiatry

## 2017-05-30 ENCOUNTER — Encounter: Payer: Self-pay | Admitting: Anesthesiology

## 2017-05-30 DIAGNOSIS — I10 Essential (primary) hypertension: Secondary | ICD-10-CM | POA: Diagnosis not present

## 2017-05-30 DIAGNOSIS — E119 Type 2 diabetes mellitus without complications: Secondary | ICD-10-CM | POA: Diagnosis not present

## 2017-05-30 DIAGNOSIS — F332 Major depressive disorder, recurrent severe without psychotic features: Secondary | ICD-10-CM | POA: Insufficient documentation

## 2017-05-30 DIAGNOSIS — E1142 Type 2 diabetes mellitus with diabetic polyneuropathy: Secondary | ICD-10-CM | POA: Insufficient documentation

## 2017-05-30 DIAGNOSIS — F339 Major depressive disorder, recurrent, unspecified: Secondary | ICD-10-CM | POA: Diagnosis not present

## 2017-05-30 DIAGNOSIS — Z6837 Body mass index (BMI) 37.0-37.9, adult: Secondary | ICD-10-CM | POA: Insufficient documentation

## 2017-05-30 DIAGNOSIS — E669 Obesity, unspecified: Secondary | ICD-10-CM | POA: Insufficient documentation

## 2017-05-30 DIAGNOSIS — F609 Personality disorder, unspecified: Secondary | ICD-10-CM | POA: Insufficient documentation

## 2017-05-30 DIAGNOSIS — E7800 Pure hypercholesterolemia, unspecified: Secondary | ICD-10-CM | POA: Diagnosis not present

## 2017-05-30 DIAGNOSIS — K219 Gastro-esophageal reflux disease without esophagitis: Secondary | ICD-10-CM | POA: Insufficient documentation

## 2017-05-30 DIAGNOSIS — Z8249 Family history of ischemic heart disease and other diseases of the circulatory system: Secondary | ICD-10-CM | POA: Insufficient documentation

## 2017-05-30 DIAGNOSIS — Z833 Family history of diabetes mellitus: Secondary | ICD-10-CM | POA: Insufficient documentation

## 2017-05-30 DIAGNOSIS — G4730 Sleep apnea, unspecified: Secondary | ICD-10-CM | POA: Diagnosis not present

## 2017-05-30 DIAGNOSIS — F3189 Other bipolar disorder: Secondary | ICD-10-CM | POA: Diagnosis not present

## 2017-05-30 LAB — GLUCOSE, CAPILLARY: Glucose-Capillary: 93 mg/dL (ref 65–99)

## 2017-05-30 MED ORDER — SODIUM CHLORIDE 0.9 % IV SOLN
INTRAVENOUS | Status: DC | PRN
  Administered 2017-05-30: 09:00:00 via INTRAVENOUS

## 2017-05-30 MED ORDER — GLYCOPYRROLATE 0.2 MG/ML IJ SOLN
0.4000 mg | Freq: Once | INTRAMUSCULAR | Status: AC
  Administered 2017-05-30: 0.4 mg via INTRAVENOUS

## 2017-05-30 MED ORDER — GLYCOPYRROLATE 0.2 MG/ML IJ SOLN
INTRAMUSCULAR | Status: AC
  Filled 2017-05-30: qty 2

## 2017-05-30 MED ORDER — SUCCINYLCHOLINE CHLORIDE 200 MG/10ML IV SOSY
PREFILLED_SYRINGE | INTRAVENOUS | Status: DC | PRN
  Administered 2017-05-30: 100 mg via INTRAVENOUS

## 2017-05-30 MED ORDER — SUCCINYLCHOLINE CHLORIDE 20 MG/ML IJ SOLN
INTRAMUSCULAR | Status: AC
  Filled 2017-05-30: qty 1

## 2017-05-30 MED ORDER — KETOROLAC TROMETHAMINE 30 MG/ML IJ SOLN
30.0000 mg | Freq: Once | INTRAMUSCULAR | Status: AC
  Administered 2017-05-30: 30 mg via INTRAVENOUS

## 2017-05-30 MED ORDER — LABETALOL HCL 5 MG/ML IV SOLN
INTRAVENOUS | Status: DC | PRN
  Administered 2017-05-30: 20 mg via INTRAVENOUS

## 2017-05-30 MED ORDER — METHOHEXITAL SODIUM 100 MG/10ML IV SOSY
PREFILLED_SYRINGE | INTRAVENOUS | Status: DC | PRN
  Administered 2017-05-30: 70 mg via INTRAVENOUS

## 2017-05-30 MED ORDER — LABETALOL HCL 5 MG/ML IV SOLN
INTRAVENOUS | Status: AC
  Filled 2017-05-30: qty 4

## 2017-05-30 MED ORDER — SODIUM CHLORIDE 0.9 % IV SOLN
500.0000 mL | Freq: Once | INTRAVENOUS | Status: AC
  Administered 2017-05-30: 1000 mL via INTRAVENOUS

## 2017-05-30 MED ORDER — KETOROLAC TROMETHAMINE 30 MG/ML IJ SOLN
INTRAMUSCULAR | Status: AC
Start: 2017-05-30 — End: 2017-05-30
  Administered 2017-05-30: 30 mg via INTRAVENOUS
  Filled 2017-05-30: qty 1

## 2017-05-30 MED ORDER — ESMOLOL HCL 100 MG/10ML IV SOLN
INTRAVENOUS | Status: AC
  Filled 2017-05-30: qty 10

## 2017-05-30 MED ORDER — METHOHEXITAL SODIUM 0.5 G IJ SOLR
INTRAMUSCULAR | Status: AC
  Filled 2017-05-30: qty 500

## 2017-05-30 MED ORDER — ESMOLOL HCL 100 MG/10ML IV SOLN
INTRAVENOUS | Status: DC | PRN
  Administered 2017-05-30: 20 mg via INTRAVENOUS

## 2017-05-30 NOTE — Anesthesia Post-op Follow-up Note (Cosign Needed)
Anesthesia QCDR form completed.        

## 2017-05-30 NOTE — Progress Notes (Signed)
Fluids for pacu 100

## 2017-05-30 NOTE — Transfer of Care (Addendum)
Immediate Anesthesia Transfer of Care Note  Patient: Melinda [name]  Procedure(s) Performed: ECT  Patient Location: PACU  Anesthesia Type:General  Level of Consciousness: sedated  Airway & Oxygen Therapy: Patient Spontanous Breathing and Patient connected to face mask oxygen  Post-op Assessment: Report given to RN and Post -op Vital signs reviewed and stable  Post vital signs: Reviewed and stable  Last Vitals:  Vitals:   05/30/17 1059 05/30/17 1101  BP: 128/78 114/65  Pulse: 94 92  Resp: (!) 22 16  Temp:      Last Pain:  Vitals:   05/30/17 1101  TempSrc:   PainSc: 3       Patients Stated Pain Goal: 0 (14/23/95 3202)  Complications: No apparent anesthesia complications

## 2017-05-30 NOTE — Anesthesia Procedure Notes (Signed)
Date/Time: 05/30/2017 10:22 AM Performed by: Dionne Bucy Pre-anesthesia Checklist: Patient identified, Emergency Drugs available, Suction available and Patient being monitored Patient Re-evaluated:Patient Re-evaluated prior to induction Oxygen Delivery Method: Circle system utilized Preoxygenation: Pre-oxygenation with 100% oxygen Induction Type: IV induction Ventilation: Mask ventilation without difficulty and Mask ventilation throughout procedure Airway Equipment and Method: Bite block Placement Confirmation: positive ETCO2 Dental Injury: Teeth and Oropharynx as per pre-operative assessment

## 2017-05-30 NOTE — Anesthesia Preprocedure Evaluation (Signed)
Anesthesia Evaluation  Patient identified by MRN, date of birth, ID band Patient awake    Reviewed: Allergy & Precautions, H&P , NPO status , Patient's Chart, lab work & pertinent test results  History of Anesthesia Complications Negative for: history of anesthetic complications  Airway Mallampati: II  TM Distance: >3 FB Neck ROM: full    Dental  (+) Poor Dentition, Chipped   Pulmonary sleep apnea , neg COPD,    Pulmonary exam normal breath sounds clear to auscultation       Cardiovascular hypertension, Pt. on medications (-) CAD and (-) Past MI negative cardio ROS Normal cardiovascular exam Rhythm:regular Rate:Normal     Neuro/Psych PSYCHIATRIC DISORDERS Depression Bipolar Disorder  Neuromuscular disease negative neurological ROS     GI/Hepatic negative GI ROS, Neg liver ROS, GERD  Controlled,  Endo/Other  diabetes, Type 2, Oral Hypoglycemic Agents  Renal/GU negative Renal ROS  negative genitourinary   Musculoskeletal   Abdominal (+) + obese,   Peds  Hematology negative hematology ROS (+)   Anesthesia Other Findings Past Medical History: No date: Depression No date: Diabetes mellitus without complication ([facility]) 03/07/15: Diabetic peripheral neuropathy ([facility]) 03/07/15: Diabetic peripheral neuropathy ([facility]) 03/07/15: Diabetic peripheral neuropathy ([facility]) No date: GERD (gastroesophageal reflux disease) 03/07/15: Hypercholesterolemia No date: Hypertension 03/07/15: Obesity 03/07/15: Personality disorder 03/07/15: Sinus tachycardia ([facility])     Comment: history of 03/07/15: Suicidal thoughts Past Surgical History: 03/07/15: electroconvulsion therapy BMI    Body Mass Index:  36.44 kg/m     Reproductive/Obstetrics                             Anesthesia Physical  Anesthesia Plan  ASA: III  Anesthesia Plan: General   Post-op Pain Management:    Induction: Intravenous  PONV Risk Score and  Plan:   Airway Management Planned: Mask  Additional Equipment:   Intra-op Plan:   Post-operative Plan:   Informed Consent: I have reviewed the patients History and Physical, chart, labs and discussed the procedure including the risks, benefits and alternatives for the proposed anesthesia with the patient or authorized representative who has indicated his/her understanding and acceptance.   Dental Advisory Given  Plan Discussed with: CRNA and Anesthesiologist  Anesthesia Plan Comments: (Patient consented for risks of anesthesia including but not limited to:  - adverse reactions to medications - damage to teeth, lips or other oral mucosa - sore throat or hoarseness - Damage to heart, brain, lungs or loss of life  Patient voiced understanding.)        Anesthesia Quick Evaluation  

## 2017-05-30 NOTE — Procedures (Signed)
ECT SERVICES Physician's Interval Evaluation & Treatment Note  Patient Identification: DERRICK ORRIS MRN:  [id number] Date of Evaluation:  05/30/2017 TX #: [id number]  MADRS:   MMSE:   P.E. Findings:  Vitals unremarkable. Heart normal.  Psychiatric Interval Note:  Mood mildly depressed chronic about the same  Subjective:  Patient is a 46 y.o. female seen for evaluation for Electroconvulsive Therapy. No specific change  Treatment Summary:   []   Right Unilateral             [x]  Bilateral   % Energy : 1.0 ms 35%   Impedance: 2410 ohms  Seizure Energy Index: No reading but it looks very good to maintain  Postictal Suppression Index: For whatever reason there was no reading but looked quite good  Seizure Concordance Index: 96%  Medications  Pre Shock: Robinul 0.4 mg labetalol 20 mg esmolol 10 mg Toradol 30 mg Brevital 70 mg succinylcholine 100 mg  Post Shock:    Seizure Duration: 46 seconds by EMG 91 seconds by EEG   Comments: Follow-up 2 weeks   Lungs:  [x]   Clear to auscultation               []  Other:   Heart:    [x]   Regular rhythm             []  irregular rhythm    [x]   Previous H&P reviewed, patient examined and there are NO CHANGES                 []   Previous H&P reviewed, patient examined and there are changes noted.   [name], MD 7/20/201810:15 AM

## 2017-05-30 NOTE — H&P (Signed)
Melinda [name] is an 46 y.o. female.   Chief Complaint: Chronic depression about the same as usual no specific new complaint HPI: History of recurrent severe depression that had been stabilized with ECT. We are trying to move into every other week treatment  Past Medical History:  Diagnosis Date  . Depression   . Diabetes mellitus without complication ([place])   . Diabetic peripheral neuropathy ([place]) 03/07/15  . Diabetic peripheral neuropathy ([place]) 03/07/15  . Diabetic peripheral neuropathy ([place]) 03/07/15  . GERD (gastroesophageal reflux disease)   . Hypercholesterolemia 03/07/15  . Hypertension   . Obesity 03/07/15  . Personality disorder 03/07/15  . Sinus tachycardia 03/07/15   history of  . Suicidal thoughts 03/07/15    Past Surgical History:  Procedure Laterality Date  . electroconvulsion therapy  03/07/15    Family History  Problem Relation Age of Onset  . Hypertension Father   . Diabetes Mother    Social History:  reports that she has never smoked. She has never used smokeless tobacco. She reports that she does not drink alcohol or use drugs.  Allergies:  Allergies  Allergen Reactions  . Prednisone     Increases blood sugar     (Not in a hospital admission)  Results for orders placed or performed during the hospital encounter of 05/30/17 (from the past 48 hour(s))  Glucose, capillary     Status: None   Collection Time: 05/30/17  8:37 AM  Result Value Ref Range   Glucose-Capillary 93 65 - 99 mg/dL   Comment 1 Notify RN    Comment 2 Document in Chart    No results found.  Review of Systems  Constitutional: Negative.   HENT: Negative.   Eyes: Negative.   Respiratory: Negative.   Cardiovascular: Negative.   Gastrointestinal: Negative.   Musculoskeletal: Negative.   Skin: Negative.   Neurological: Negative.   Psychiatric/Behavioral: Positive for depression. Negative for hallucinations, memory loss, substance abuse and suicidal ideas. The patient is not  nervous/anxious and does not have insomnia.     Blood pressure 133/87, pulse (!) 104, temperature 98.3 F (36.8 C), temperature source Oral, resp. rate 18, height 5\' 5"  (1.651 m), weight 235 lb (106.6 kg), last menstrual period 05/12/2017, SpO2 100 %. Physical Exam  Nursing note and vitals reviewed. Constitutional: She appears well-developed and well-nourished.  HENT:  Head: Normocephalic and atraumatic.  Eyes: Pupils are equal, round, and reactive to light. Conjunctivae are normal.  Neck: Normal range of motion.  Cardiovascular: Regular rhythm and normal heart sounds.   Respiratory: Effort normal. No respiratory distress.  GI: Soft.  Musculoskeletal: Normal range of motion.  Neurological: She is alert.  Skin: Skin is warm and dry.  Psychiatric: Judgment normal. Her speech is delayed. She is slowed. Thought content is not paranoid. Cognition and memory are normal. She exhibits a depressed mood. She expresses no homicidal and no suicidal ideation.     Assessment/Plan Treatment today with a follow-up in 2 weeks.  [name], MD 05/30/2017, 10:14 AM

## 2017-05-30 NOTE — Discharge Instructions (Signed)
1)  The drugs that you have been given will stay in your system until tomorrow so for the       next 24 hours you should not:  A. Drive an automobile  B. Make any legal decisions  C. Drink any alcoholic beverages  2)  You may resume your regular meals upon return home.  3)  A responsible adult must take you home.  Someone should stay with you for a few          hours, then be available by phone for the remainder of the treatment day.  4)  You May experience any of the following symptoms:  Headache, Nausea and a dry mouth (due to the medications you were given),  temporary memory loss and some confusion, or sore muscles (a warm bath  should help this).  If you you experience any of these symptoms let us know on                your return visit.  5)  Report any of the following: any acute discomfort, severe headache, or temperature        greater than 100.5 F.   Also report any unusual redness, swelling, drainage, or pain         at your IV site.    You may report Symptoms to:  [place] at [facility]          Phone: [phone], ECT Department           or Dr. Prescott Gum office [phone]  6)  Your next ECT Treatment is Day  Friday  Date  June 13, 2017  We will call 2 days prior to your scheduled appointment for arrival times.  7)  Nothing to eat or drink after midnight the night before your procedure.  8)  Take .     With a sip of water the morning of your procedure.  9)  Other Instructions: Call [phone] to cancel the morning of your procedure due         to illness or emergency.  10) We will call within 72 hours to assess how you are feeling.

## 2017-05-31 NOTE — Anesthesia Postprocedure Evaluation (Signed)
Anesthesia Post Note  Patient: Nichola M Phifer  Procedure(s) Performed: * No procedures listed *  Patient location during evaluation: PACU Anesthesia Type: General Level of consciousness: awake and alert Pain management: pain level controlled Vital Signs Assessment: post-procedure vital signs reviewed and stable Respiratory status: spontaneous breathing, nonlabored ventilation, respiratory function stable and patient connected to nasal cannula oxygen Cardiovascular status: blood pressure returned to baseline and stable Postop Assessment: no signs of nausea or vomiting Anesthetic complications: no     Last Vitals:  Vitals:   05/30/17 1059 05/30/17 1101  BP: 128/78 114/65  Pulse: 94 92  Resp: (!) 22 16  Temp:      Last Pain:  Vitals:   05/30/17 1101  TempSrc:   PainSc: 3                  Martha Clan

## 2017-06-09 ENCOUNTER — Telehealth (HOSPITAL_COMMUNITY): Payer: Self-pay | Admitting: *Deleted

## 2017-06-09 NOTE — Telephone Encounter (Signed)
Called for Prior authorization of ECT to get extension of days. Initial authorization was for 12 session until 06/10/17. Called [employer] at [phone] spoke with Beverlee Nims who gave continuation of the 12 sessions (3 remaining) until 11/10/17. Josem Kaufmann #[phone].

## 2017-06-11 ENCOUNTER — Telehealth: Payer: Self-pay | Admitting: *Deleted

## 2017-06-12 ENCOUNTER — Other Ambulatory Visit: Payer: Self-pay | Admitting: Psychiatry

## 2017-06-13 ENCOUNTER — Encounter: Payer: Self-pay | Admitting: Registered Nurse

## 2017-06-13 ENCOUNTER — Ambulatory Visit
Admission: RE | Admit: 2017-06-13 | Discharge: 2017-06-13 | Disposition: A | Discharge status: Home / Self Care | Payer: Medicare PPO | Source: Ambulatory Visit | Attending: Psychiatry | Admitting: Psychiatry

## 2017-06-13 DIAGNOSIS — E669 Obesity, unspecified: Secondary | ICD-10-CM | POA: Insufficient documentation

## 2017-06-13 DIAGNOSIS — E7800 Pure hypercholesterolemia, unspecified: Secondary | ICD-10-CM | POA: Insufficient documentation

## 2017-06-13 DIAGNOSIS — I10 Essential (primary) hypertension: Secondary | ICD-10-CM | POA: Diagnosis not present

## 2017-06-13 DIAGNOSIS — Z7984 Long term (current) use of oral hypoglycemic drugs: Secondary | ICD-10-CM | POA: Diagnosis not present

## 2017-06-13 DIAGNOSIS — F332 Major depressive disorder, recurrent severe without psychotic features: Secondary | ICD-10-CM | POA: Diagnosis not present

## 2017-06-13 DIAGNOSIS — Z79899 Other long term (current) drug therapy: Secondary | ICD-10-CM | POA: Insufficient documentation

## 2017-06-13 DIAGNOSIS — Z6839 Body mass index (BMI) 39.0-39.9, adult: Secondary | ICD-10-CM | POA: Diagnosis not present

## 2017-06-13 DIAGNOSIS — F339 Major depressive disorder, recurrent, unspecified: Secondary | ICD-10-CM | POA: Diagnosis not present

## 2017-06-13 DIAGNOSIS — K219 Gastro-esophageal reflux disease without esophagitis: Secondary | ICD-10-CM | POA: Insufficient documentation

## 2017-06-13 DIAGNOSIS — E119 Type 2 diabetes mellitus without complications: Secondary | ICD-10-CM | POA: Diagnosis not present

## 2017-06-13 DIAGNOSIS — G4730 Sleep apnea, unspecified: Secondary | ICD-10-CM | POA: Diagnosis not present

## 2017-06-13 DIAGNOSIS — F3189 Other bipolar disorder: Secondary | ICD-10-CM | POA: Diagnosis not present

## 2017-06-13 DIAGNOSIS — E1142 Type 2 diabetes mellitus with diabetic polyneuropathy: Secondary | ICD-10-CM | POA: Insufficient documentation

## 2017-06-13 LAB — GLUCOSE, CAPILLARY: Glucose-Capillary: 105 mg/dL — ABNORMAL HIGH (ref 65–99)

## 2017-06-13 MED ORDER — KETOROLAC TROMETHAMINE 30 MG/ML IJ SOLN
INTRAMUSCULAR | Status: AC
  Administered 2017-06-13: 30 mg via INTRAVENOUS
  Filled 2017-06-13: qty 1

## 2017-06-13 MED ORDER — GLYCOPYRROLATE 0.2 MG/ML IJ SOLN
INTRAMUSCULAR | Status: AC
  Administered 2017-06-13: 0.4 mg via INTRAVENOUS
  Filled 2017-06-13: qty 1

## 2017-06-13 MED ORDER — SUCCINYLCHOLINE CHLORIDE 20 MG/ML IJ SOLN
INTRAMUSCULAR | Status: DC | PRN
  Administered 2017-06-13: 100 mg via INTRAVENOUS

## 2017-06-13 MED ORDER — GLYCOPYRROLATE 0.2 MG/ML IJ SOLN
0.4000 mg | Freq: Once | INTRAMUSCULAR | Status: AC
  Administered 2017-06-13: 0.4 mg via INTRAVENOUS

## 2017-06-13 MED ORDER — LABETALOL HCL 5 MG/ML IV SOLN
INTRAVENOUS | Status: DC | PRN
  Administered 2017-06-13: 20 mg via INTRAVENOUS

## 2017-06-13 MED ORDER — FENTANYL CITRATE (PF) 100 MCG/2ML IJ SOLN
INTRAMUSCULAR | Status: AC
  Filled 2017-06-13: qty 2

## 2017-06-13 MED ORDER — GLYCOPYRROLATE 0.2 MG/ML IJ SOLN
INTRAMUSCULAR | Status: AC
  Filled 2017-06-13: qty 1

## 2017-06-13 MED ORDER — METHOHEXITAL SODIUM 0.5 G IJ SOLR
INTRAMUSCULAR | Status: AC
  Filled 2017-06-13: qty 500

## 2017-06-13 MED ORDER — SODIUM CHLORIDE 0.9 % IV SOLN
INTRAVENOUS | Status: DC | PRN
  Administered 2017-06-13: 10:00:00 via INTRAVENOUS

## 2017-06-13 MED ORDER — ONDANSETRON HCL 4 MG/2ML IJ SOLN: 4.0000 mg | Freq: Once | INTRAMUSCULAR | Status: DC | PRN

## 2017-06-13 MED ORDER — SODIUM CHLORIDE 0.9 % IV SOLN
500.0000 mL | Freq: Once | INTRAVENOUS | Status: AC
  Administered 2017-06-13: 1000 mL via INTRAVENOUS

## 2017-06-13 MED ORDER — METHOHEXITAL SODIUM 100 MG/10ML IV SOSY
PREFILLED_SYRINGE | INTRAVENOUS | Status: DC | PRN
  Administered 2017-06-13: 70 mg via INTRAVENOUS

## 2017-06-13 MED ORDER — KETOROLAC TROMETHAMINE 30 MG/ML IJ SOLN
30.0000 mg | Freq: Once | INTRAMUSCULAR | Status: AC
  Administered 2017-06-13: 30 mg via INTRAVENOUS

## 2017-06-13 MED ORDER — FENTANYL CITRATE (PF) 100 MCG/2ML IJ SOLN
25.0000 ug | INTRAMUSCULAR | Status: DC | PRN
  Administered 2017-06-13: 25 ug via INTRAVENOUS

## 2017-06-13 MED ORDER — ESMOLOL HCL 100 MG/10ML IV SOLN
INTRAVENOUS | Status: DC | PRN
  Administered 2017-06-13: 20 mg via INTRAVENOUS

## 2017-06-13 NOTE — Anesthesia Post-op Follow-up Note (Cosign Needed)
Anesthesia QCDR form completed.        

## 2017-06-13 NOTE — Transfer of Care (Signed)
Immediate Anesthesia Transfer of Care Note  Patient: Melinda [name]  Procedure(s) Performed: * No procedures listed *  Patient Location: PACU  Anesthesia Type:General  Level of Consciousness: awake, alert  and oriented  Airway & Oxygen Therapy: Patient Spontanous Breathing and Patient connected to face mask oxygen  Post-op Assessment: Report given to RN and Post -op Vital signs reviewed and stable  Post vital signs: Reviewed and stable  Last Vitals:  Vitals:   06/13/17 0831 06/13/17 1043  BP: (!) 156/92 108/63  Pulse: 86 96  Resp: 18 14  Temp: 36.7 C 37.1 C    Last Pain:  Vitals:   06/13/17 1043  TempSrc: Temporal      Patients Stated Pain Goal: 0 (11/65/79 0383)  Complications: No apparent anesthesia complications

## 2017-06-13 NOTE — Anesthesia Preprocedure Evaluation (Signed)
Anesthesia Evaluation  Patient identified by MRN, date of birth, ID band Patient awake    Reviewed: Allergy & Precautions, H&P , NPO status , Patient's Chart, lab work & pertinent test results  History of Anesthesia Complications Negative for: history of anesthetic complications  Airway Mallampati: II  TM Distance: >3 FB Neck ROM: full    Dental  (+) Poor Dentition, Chipped   Pulmonary sleep apnea , neg COPD,    Pulmonary exam normal breath sounds clear to auscultation       Cardiovascular hypertension, Pt. on medications (-) CAD and (-) Past MI negative cardio ROS Normal cardiovascular exam Rhythm:regular Rate:Normal     Neuro/Psych PSYCHIATRIC DISORDERS Depression Bipolar Disorder  Neuromuscular disease negative neurological ROS     GI/Hepatic negative GI ROS, Neg liver ROS, GERD  Controlled,  Endo/Other  diabetes, Type 2, Oral Hypoglycemic Agents  Renal/GU negative Renal ROS  negative genitourinary   Musculoskeletal   Abdominal (+) + obese,   Peds  Hematology negative hematology ROS (+)   Anesthesia Other Findings Past Medical History: No date: Depression No date: Diabetes mellitus without complication ([facility]) 03/07/15: Diabetic peripheral neuropathy ([facility]) 03/07/15: Diabetic peripheral neuropathy ([facility]) 03/07/15: Diabetic peripheral neuropathy ([facility]) No date: GERD (gastroesophageal reflux disease) 03/07/15: Hypercholesterolemia No date: Hypertension 03/07/15: Obesity 03/07/15: Personality disorder 03/07/15: Sinus tachycardia ([facility])     Comment: history of 03/07/15: Suicidal thoughts Past Surgical History: 03/07/15: electroconvulsion therapy BMI    Body Mass Index:  36.44 kg/m     Reproductive/Obstetrics                             Anesthesia Physical  Anesthesia Plan  ASA: III  Anesthesia Plan: General   Post-op Pain Management:    Induction: Intravenous  PONV Risk Score and  Plan:   Airway Management Planned: Mask  Additional Equipment:   Intra-op Plan:   Post-operative Plan:   Informed Consent: I have reviewed the patients History and Physical, chart, labs and discussed the procedure including the risks, benefits and alternatives for the proposed anesthesia with the patient or authorized representative who has indicated his/her understanding and acceptance.   Dental Advisory Given  Plan Discussed with: CRNA and Anesthesiologist  Anesthesia Plan Comments: (Patient consented for risks of anesthesia including but not limited to:  - adverse reactions to medications - damage to teeth, lips or other oral mucosa - sore throat or hoarseness - Damage to heart, brain, lungs or loss of life  Patient voiced understanding.)        Anesthesia Quick Evaluation  

## 2017-06-13 NOTE — Procedures (Signed)
ECT SERVICES Physician's Interval Evaluation & Treatment Note  Patient Identification: Melinda [name] MRN:  [id number] Date of Evaluation:  06/13/2017 TX #: [id number]  MADRS:   MMSE:   P.E. Findings:  No change to physical exam vitals normal heart and lung normal  Psychiatric Interval Note:  Mood is stable no major depression no psychosis  Subjective:  Patient is a 46 y.o. female seen for evaluation for Electroconvulsive Therapy. Stable no new complaint  Treatment Summary:   []   Right Unilateral             [x]  Bilateral   % Energy : 1.0 ms 35%   Impedance: 1960 ohms  Seizure Energy Index:    Postictal Suppression Index: v   Seizure Concordance Index:    Medications  Pre Shock: Robinul 0.4 mg Toradol 30 mg labetalol 20 mg esmolol 10 mg Brevital 70 mg succinylcholine 100 mg  Post Shock:    Seizure Duration: 41 seconds by EMG 79 seconds by EEG   Comments: Follow-up 2 weeks   Lungs:  [x]   Clear to auscultation               []  Other:   Heart:    [x]   Regular rhythm             []  irregular rhythm    [x]   Previous H&P reviewed, patient examined and there are NO CHANGES                 []   Previous H&P reviewed, patient examined and there are changes noted.   [name], MD 8/3/201810:30 AM

## 2017-06-13 NOTE — Anesthesia Postprocedure Evaluation (Signed)
Anesthesia Post Note  Patient: Melinda [name]  Procedure(s) Performed: * No procedures listed *  Patient location during evaluation: PACU Anesthesia Type: General Level of consciousness: awake and alert and oriented Pain management: pain level controlled Vital Signs Assessment: post-procedure vital signs reviewed and stable Respiratory status: spontaneous breathing Cardiovascular status: blood pressure returned to baseline Anesthetic complications: no     Last Vitals:  Vitals:   06/13/17 1124 06/13/17 1132  BP:  131/84  Pulse:  81  Resp: 18 16  Temp:      Last Pain:  Vitals:   06/13/17 1132  TempSrc:   PainSc: 3                  Genese Quebedeaux

## 2017-06-13 NOTE — Anesthesia Procedure Notes (Signed)
Procedure Name: General with mask airway Performed by: Hedda Slade Pre-anesthesia Checklist: Patient identified, Emergency Drugs available, Suction available and Patient being monitored Patient Re-evaluated:Patient Re-evaluated prior to induction Oxygen Delivery Method: Circle system utilized Preoxygenation: Pre-oxygenation with 100% oxygen Induction Type: IV induction Ventilation: Mask ventilation without difficulty Airway Equipment and Method: Bite block Dental Injury: Teeth and Oropharynx as per pre-operative assessment

## 2017-06-13 NOTE — Discharge Instructions (Signed)
1)  The drugs that you have been given will stay in your system until tomorrow so for the       next 24 hours you should not:  A. Drive an automobile  B. Make any legal decisions  C. Drink any alcoholic beverages  2)  You may resume your regular meals upon return home.  3)  A responsible adult must take you home.  Someone should stay with you for a few          hours, then be available by phone for the remainder of the treatment day.  4)  You May experience any of the following symptoms:  Headache, Nausea and a dry mouth (due to the medications you were given),  temporary memory loss and some confusion, or sore muscles (a warm bath  should help this).  If you you experience any of these symptoms let us know on                your return visit.  5)  Report any of the following: any acute discomfort, severe headache, or temperature        greater than 100.5 F.   Also report any unusual redness, swelling, drainage, or pain         at your IV site.    You may report Symptoms to:  [place] [facility]          Phone: [phone], ECT Department           or Dr. Prescott Gum office [phone]  6)  Your next ECT Treatment is Day Friday  Date June 27, 2017  We will call 2 days prior to your scheduled appointment for arrival times.  7)  Nothing to eat or drink after midnight the night before your procedure.  8)  Take .     With a sip of water the morning of your procedure.  9)  Other Instructions: Call [phone] to cancel the morning of your procedure due         to illness or emergency.  10) We will call within 72 hours to assess how you are feeling.

## 2017-06-13 NOTE — H&P (Signed)
Melinda [name] is an 46 y.o. female.   Chief Complaint: No specific new complaint HPI: History of recurrent severe depression showing improvement and stabilization with maintenance ECT now moving to every other week treatment  Past Medical History:  Diagnosis Date  . Depression   . Diabetes mellitus without complication ([place])   . Diabetic peripheral neuropathy ([place]) 03/07/15  . Diabetic peripheral neuropathy ([place]) 03/07/15  . Diabetic peripheral neuropathy ([place]) 03/07/15  . GERD (gastroesophageal reflux disease)   . Hypercholesterolemia 03/07/15  . Hypertension   . Obesity 03/07/15  . Personality disorder 03/07/15  . Sinus tachycardia 03/07/15   history of  . Suicidal thoughts 03/07/15    Past Surgical History:  Procedure Laterality Date  . electroconvulsion therapy  03/07/15    Family History  Problem Relation Age of Onset  . Hypertension Father   . Diabetes Mother    Social History:  reports that she has never smoked. She has never used smokeless tobacco. She reports that she does not drink alcohol or use drugs.  Allergies:  Allergies  Allergen Reactions  . Prednisone     Increases blood sugar     (Not in a hospital admission)  Results for orders placed or performed during the hospital encounter of 06/13/17 (from the past 48 hour(s))  Glucose, capillary     Status: Abnormal   Collection Time: 06/13/17  8:33 AM  Result Value Ref Range   Glucose-Capillary 105 (H) 65 - 99 mg/dL   No results found.  Review of Systems  Constitutional: Negative.   HENT: Negative.   Eyes: Negative.   Respiratory: Negative.   Cardiovascular: Negative.   Gastrointestinal: Negative.   Musculoskeletal: Negative.   Skin: Negative.   Neurological: Negative.   Psychiatric/Behavioral: Negative.     Blood pressure (!) 156/92, pulse 86, temperature 98.1 F (36.7 C), temperature source Oral, resp. rate 18, height 5\' 5"  (1.651 m), weight 107 kg (236 lb), SpO2 100 %. Physical Exam  Nursing  note and vitals reviewed. Constitutional: She appears well-developed and well-nourished.  HENT:  Head: Normocephalic and atraumatic.  Eyes: Pupils are equal, round, and reactive to light. Conjunctivae are normal.  Neck: Normal range of motion.  Cardiovascular: Normal heart sounds.   Respiratory: Effort normal.  GI: Soft.  Musculoskeletal: Normal range of motion.  Neurological: She is alert.  Skin: Skin is warm and dry.  Psychiatric: She has a normal mood and affect. Her behavior is normal. Judgment and thought content normal.     Assessment/Plan Doing well. No new complaints. No physical problems. Follow-up 2 weeks  [name], MD 06/13/2017, 10:29 AM

## 2017-06-20 ENCOUNTER — Telehealth: Payer: Self-pay | Admitting: *Deleted

## 2017-06-27 ENCOUNTER — Encounter: Payer: Self-pay | Admitting: Anesthesiology

## 2017-06-27 ENCOUNTER — Ambulatory Visit
Admission: RE | Admit: 2017-06-27 | Discharge: 2017-06-27 | Disposition: A | Discharge status: Home / Self Care | Payer: Medicare PPO | Source: Ambulatory Visit | Attending: Psychiatry | Admitting: Psychiatry

## 2017-06-27 ENCOUNTER — Other Ambulatory Visit: Payer: Self-pay | Admitting: Psychiatry

## 2017-06-27 DIAGNOSIS — F329 Major depressive disorder, single episode, unspecified: Secondary | ICD-10-CM | POA: Insufficient documentation

## 2017-06-27 DIAGNOSIS — F3189 Other bipolar disorder: Secondary | ICD-10-CM | POA: Diagnosis not present

## 2017-06-27 DIAGNOSIS — F332 Major depressive disorder, recurrent severe without psychotic features: Secondary | ICD-10-CM | POA: Diagnosis not present

## 2017-06-27 DIAGNOSIS — I10 Essential (primary) hypertension: Secondary | ICD-10-CM | POA: Diagnosis not present

## 2017-06-27 DIAGNOSIS — E119 Type 2 diabetes mellitus without complications: Secondary | ICD-10-CM | POA: Diagnosis not present

## 2017-06-27 DIAGNOSIS — E1142 Type 2 diabetes mellitus with diabetic polyneuropathy: Secondary | ICD-10-CM | POA: Diagnosis not present

## 2017-06-27 DIAGNOSIS — F339 Major depressive disorder, recurrent, unspecified: Secondary | ICD-10-CM | POA: Diagnosis not present

## 2017-06-27 LAB — GLUCOSE, CAPILLARY: Glucose-Capillary: 89 mg/dL (ref 65–99)

## 2017-06-27 MED ORDER — SUCCINYLCHOLINE CHLORIDE 20 MG/ML IJ SOLN
INTRAMUSCULAR | Status: AC
  Filled 2017-06-27: qty 1

## 2017-06-27 MED ORDER — ESMOLOL HCL 100 MG/10ML IV SOLN
INTRAVENOUS | Status: AC
  Filled 2017-06-27: qty 10

## 2017-06-27 MED ORDER — METHOHEXITAL SODIUM 100 MG/10ML IV SOSY
PREFILLED_SYRINGE | INTRAVENOUS | Status: DC | PRN
  Administered 2017-06-27: 70 mg via INTRAVENOUS

## 2017-06-27 MED ORDER — SUCCINYLCHOLINE CHLORIDE 200 MG/10ML IV SOSY
PREFILLED_SYRINGE | INTRAVENOUS | Status: DC | PRN
  Administered 2017-06-27: 100 mg via INTRAVENOUS

## 2017-06-27 MED ORDER — ESMOLOL HCL 100 MG/10ML IV SOLN
INTRAVENOUS | Status: DC | PRN
  Administered 2017-06-27: 20 mg via INTRAVENOUS

## 2017-06-27 MED ORDER — ONDANSETRON HCL 4 MG/2ML IJ SOLN: 4.0000 mg | Freq: Once | INTRAMUSCULAR | Status: DC | PRN

## 2017-06-27 MED ORDER — GLYCOPYRROLATE 0.2 MG/ML IJ SOLN
0.4000 mg | Freq: Once | INTRAMUSCULAR | Status: AC
  Administered 2017-06-27: 0.4 mg via INTRAVENOUS

## 2017-06-27 MED ORDER — KETOROLAC TROMETHAMINE 30 MG/ML IJ SOLN
INTRAMUSCULAR | Status: AC
  Filled 2017-06-27: qty 1

## 2017-06-27 MED ORDER — FENTANYL CITRATE (PF) 100 MCG/2ML IJ SOLN: 25.0000 ug | INTRAMUSCULAR | Status: DC | PRN

## 2017-06-27 MED ORDER — GLYCOPYRROLATE 0.2 MG/ML IJ SOLN
INTRAMUSCULAR | Status: AC
  Filled 2017-06-27: qty 2

## 2017-06-27 MED ORDER — SODIUM CHLORIDE 0.9 % IV SOLN
500.0000 mL | Freq: Once | INTRAVENOUS | Status: AC
  Administered 2017-06-27: 11:00:00 via INTRAVENOUS

## 2017-06-27 MED ORDER — LABETALOL HCL 5 MG/ML IV SOLN
INTRAVENOUS | Status: AC
  Filled 2017-06-27: qty 4

## 2017-06-27 MED ORDER — LABETALOL HCL 5 MG/ML IV SOLN
INTRAVENOUS | Status: DC | PRN
  Administered 2017-06-27: 20 mg via INTRAVENOUS

## 2017-06-27 MED ORDER — KETOROLAC TROMETHAMINE 30 MG/ML IJ SOLN
30.0000 mg | Freq: Once | INTRAMUSCULAR | Status: AC
  Administered 2017-06-27: 30 mg via INTRAVENOUS

## 2017-06-27 NOTE — Transfer of Care (Addendum)
Immediate Anesthesia Transfer of Care Note  Patient: Melinda [name]  Procedure(s) Performed: ECT  Patient Location: PACU  Anesthesia Type:General  Level of Consciousness: sedated  Airway & Oxygen Therapy: Patient Spontanous Breathing  Post-op Assessment: Report given to RN and Post -op Vital signs reviewed and stable  Post vital signs: Reviewed and stable  Last Vitals:  Vitals:   06/27/17 1130 06/27/17 1141  BP: (!) 140/94 (!) 160/98  Pulse: 87 88  Resp: (!) 27 (!) 24  Temp: 36.5 C 36.8 C  SpO2: 98%     Last Pain:  Vitals:   06/27/17 1141  TempSrc: Tympanic         Complications: No apparent anesthesia complications

## 2017-06-27 NOTE — Anesthesia Postprocedure Evaluation (Signed)
Anesthesia Post Note  Patient: Melinda [name]  Procedure(s) Performed: * No procedures listed *  Patient location during evaluation: PACU Anesthesia Type: General Level of consciousness: awake and alert Pain management: pain level controlled Vital Signs Assessment: post-procedure vital signs reviewed and stable Respiratory status: spontaneous breathing, nonlabored ventilation, respiratory function stable and patient connected to nasal cannula oxygen Cardiovascular status: blood pressure returned to baseline and stable Postop Assessment: no signs of nausea or vomiting Anesthetic complications: no     Last Vitals:  Vitals:   06/27/17 1130 06/27/17 1141  BP: (!) 140/94 (!) 160/98  Pulse: 87 88  Resp: (!) 27 (!) 24  Temp: 36.5 C 36.8 C  SpO2: 98%     Last Pain:  Vitals:   06/27/17 1141  TempSrc: Tympanic                 Tokiko Diefenderfer S

## 2017-06-27 NOTE — Discharge Instructions (Signed)
1)  The drugs that you have been given will stay in your system until tomorrow so for the       next 24 hours you should not:  A. Drive an automobile  B. Make any legal decisions  C. Drink any alcoholic beverages  2)  You may resume your regular meals upon return home.  3)  A responsible adult must take you home.  Someone should stay with you for a few          hours, then be available by phone for the remainder of the treatment day.  4)  You May experience any of the following symptoms:  Headache, Nausea and a dry mouth (due to the medications you were given),  temporary memory loss and some confusion, or sore muscles (a warm bath  should help this).  If you you experience any of these symptoms let us know on                your return visit.  5)  Report any of the following: any acute discomfort, severe headache, or temperature        greater than 100.5 F.   Also report any unusual redness, swelling, drainage, or pain         at your IV site.    You may report Symptoms to:  [place] at [facility]          Phone: [phone], ECT Department           or Dr. Prescott Gum office [phone]  6)  Your next ECT Treatment is Day FRIDAY  Date AUGUST 31,2018  We will call 2 days prior to your scheduled appointment for arrival times.  7)  Nothing to eat or drink after midnight the night before your procedure.  8)  Take .    With a sip of water the morning of your procedure.  9)  Other Instructions: Call [phone] to cancel the morning of your procedure due         to illness or emergency.  10) We will call within 72 hours to assess how you are feeling.

## 2017-06-27 NOTE — Anesthesia Procedure Notes (Signed)
Date/Time: 06/27/2017 10:54 AM Performed by: Dionne Bucy Pre-anesthesia Checklist: Patient identified, Emergency Drugs available, Suction available and Patient being monitored Patient Re-evaluated:Patient Re-evaluated prior to induction Oxygen Delivery Method: Circle system utilized Preoxygenation: Pre-oxygenation with 100% oxygen Induction Type: IV induction Ventilation: Mask ventilation without difficulty and Mask ventilation throughout procedure Airway Equipment and Method: Bite block Placement Confirmation: positive ETCO2 Dental Injury: Teeth and Oropharynx as per pre-operative assessment

## 2017-06-27 NOTE — H&P (Signed)
Melinda [name] is an 46 y.o. female.   Chief Complaint: Stressful week because of problems with her son HPI: History of recurrent severe depression held at [place] in part by maintenance ECT  Past Medical History:  Diagnosis Date  . Depression   . Diabetes mellitus without complication ([place])   . Diabetic peripheral neuropathy ([place]) 03/07/15  . Diabetic peripheral neuropathy ([place]) 03/07/15  . Diabetic peripheral neuropathy ([place]) 03/07/15  . GERD (gastroesophageal reflux disease)   . Hypercholesterolemia 03/07/15  . Hypertension   . Obesity 03/07/15  . Personality disorder 03/07/15  . Sinus tachycardia 03/07/15   history of  . Suicidal thoughts 03/07/15    Past Surgical History:  Procedure Laterality Date  . electroconvulsion therapy  03/07/15    Family History  Problem Relation Age of Onset  . Hypertension Father   . Diabetes Mother    Social History:  reports that she has never smoked. She has never used smokeless tobacco. She reports that she does not drink alcohol or use drugs.  Allergies:  Allergies  Allergen Reactions  . Prednisone     Increases blood sugar     (Not in a hospital admission)  Results for orders placed or performed during the hospital encounter of 06/27/17 (from the past 48 hour(s))  Glucose, capillary     Status: None   Collection Time: 06/27/17  8:34 AM  Result Value Ref Range   Glucose-Capillary 89 65 - 99 mg/dL   No results found.  Review of Systems  Constitutional: Negative.   HENT: Negative.   Eyes: Negative.   Respiratory: Negative.   Cardiovascular: Negative.   Gastrointestinal: Negative.   Musculoskeletal: Negative.   Skin: Negative.   Neurological: Negative.   Psychiatric/Behavioral: Positive for depression. Negative for hallucinations, memory loss, substance abuse and suicidal ideas. The patient is nervous/anxious. The patient does not have insomnia.     Blood pressure (!) 160/98, pulse 88, temperature 98.3 F (36.8 C), temperature  source Tympanic, resp. rate (!) 24, height 5\' 5"  (1.651 m), weight 234 lb (106.1 kg), last menstrual period 04/27/2017, SpO2 98 %. Physical Exam  Nursing note and vitals reviewed. Constitutional: She appears well-developed and well-nourished.  HENT:  Head: Normocephalic and atraumatic.  Eyes: Pupils are equal, round, and reactive to light. Conjunctivae are normal.  Neck: Normal range of motion.  Cardiovascular: Regular rhythm and normal heart sounds.   Respiratory: Effort normal and breath sounds normal. No respiratory distress.  GI: Soft.  Musculoskeletal: Normal range of motion.  Neurological: She is alert.  Skin: Skin is warm and dry.  Psychiatric: Judgment normal. Her mood appears anxious. Her speech is delayed. She is slowed. Cognition and memory are normal. She expresses no suicidal ideation.     Assessment/Plan We have gone into a plan to try every other week treatment and so far I think there has been no worsening of her condition. No suicidality no psychosis. Functioning level pretty stable. Treatment today follow-up 2 weeks.  [name], MD 06/27/2017, 12:08 PM

## 2017-06-27 NOTE — Anesthesia Preprocedure Evaluation (Signed)
Anesthesia Evaluation  Patient identified by MRN, date of birth, ID band Patient awake    Reviewed: Allergy & Precautions, NPO status , Patient's Chart, lab work & pertinent test results, reviewed documented beta blocker date and time   Airway Mallampati: III  TM Distance: >3 FB     Dental  (+) Chipped   Pulmonary sleep apnea ,           Cardiovascular hypertension, Pt. on medications      Neuro/Psych PSYCHIATRIC DISORDERS Depression Bipolar Disorder  Neuromuscular disease    GI/Hepatic GERD  Controlled,  Endo/Other  diabetes, Type 2  Renal/GU      Musculoskeletal   Abdominal   Peds  Hematology   Anesthesia Other Findings   Reproductive/Obstetrics                             Anesthesia Physical Anesthesia Plan  ASA: III  Anesthesia Plan: General   Post-op Pain Management:    Induction: Intravenous  PONV Risk Score and Plan:   Airway Management Planned:   Additional Equipment:   Intra-op Plan:   Post-operative Plan:   Informed Consent: I have reviewed the patients History and Physical, chart, labs and discussed the procedure including the risks, benefits and alternatives for the proposed anesthesia with the patient or authorized representative who has indicated his/her understanding and acceptance.     Plan Discussed with: CRNA  Anesthesia Plan Comments:         Anesthesia Quick Evaluation  

## 2017-06-27 NOTE — Anesthesia Post-op Follow-up Note (Signed)
Anesthesia QCDR form completed.        

## 2017-06-27 NOTE — Procedures (Signed)
ECT SERVICES Physician's Interval Evaluation & Treatment Note  Patient Identification: Melinda [name] MRN:  [id number] Date of Evaluation:  06/27/2017 TX #: [id number]  MADRS:   MMSE:   P.E. Findings:  No change to physical exam. Vitals normal heart and lungs normal.  Psychiatric Interval Note:  Mood slightly anxious and dysphoric but nothing out of the ordinary.Psychosis no suicidal thinking  Subjective:  Patient is a 46 y.o. female seen for evaluation for Electroconvulsive Therapy. Overall she is doing well I think with no decline with the every other week treatment  Treatment Summary:   []   Right Unilateral             [x]  Bilateral   % Energy : 1.0 ms 35%   Impedance: 2310 ohms  Seizure Energy Index: 2704 V squared  Postictal Suppression Index: 72%  Seizure Concordance Index: 68%  Medications  Pre Shock: Robinul 0.4 mg Toradol 30 mg labetalol 20 mg esmolol 10 mg Brevital 70 mg succinylcholine 100 mg  Post Shock:    Seizure Duration: 35 seconds by EMG 64 seconds by EEG   Comments: Follow-up 2 weeks   Lungs:  [x]   Clear to auscultation               []  Other:   Heart:    [x]   Regular rhythm             []  irregular rhythm    [x]   Previous H&P reviewed, patient examined and there are NO CHANGES                 []   Previous H&P reviewed, patient examined and there are changes noted.   [name], MD 8/17/201812:10 PM

## 2017-07-09 ENCOUNTER — Telehealth: Payer: Self-pay | Admitting: *Deleted

## 2017-07-10 ENCOUNTER — Other Ambulatory Visit: Payer: Self-pay | Admitting: Psychiatry

## 2017-07-11 ENCOUNTER — Encounter: Payer: Self-pay | Admitting: Anesthesiology

## 2017-07-11 ENCOUNTER — Encounter
Admission: RE | Admit: 2017-07-11 | Discharge: 2017-07-11 | Disposition: A | Discharge status: Home / Self Care | Payer: Medicare PPO | Source: Ambulatory Visit | Attending: Psychiatry | Admitting: Psychiatry

## 2017-07-11 DIAGNOSIS — E1142 Type 2 diabetes mellitus with diabetic polyneuropathy: Secondary | ICD-10-CM | POA: Insufficient documentation

## 2017-07-11 DIAGNOSIS — Z833 Family history of diabetes mellitus: Secondary | ICD-10-CM | POA: Insufficient documentation

## 2017-07-11 DIAGNOSIS — K219 Gastro-esophageal reflux disease without esophagitis: Secondary | ICD-10-CM | POA: Insufficient documentation

## 2017-07-11 DIAGNOSIS — E7800 Pure hypercholesterolemia, unspecified: Secondary | ICD-10-CM | POA: Diagnosis not present

## 2017-07-11 DIAGNOSIS — E669 Obesity, unspecified: Secondary | ICD-10-CM | POA: Diagnosis not present

## 2017-07-11 DIAGNOSIS — Z8249 Family history of ischemic heart disease and other diseases of the circulatory system: Secondary | ICD-10-CM | POA: Insufficient documentation

## 2017-07-11 DIAGNOSIS — Z136 Encounter for screening for cardiovascular disorders: Secondary | ICD-10-CM | POA: Diagnosis not present

## 2017-07-11 DIAGNOSIS — I10 Essential (primary) hypertension: Secondary | ICD-10-CM | POA: Diagnosis not present

## 2017-07-11 DIAGNOSIS — F332 Major depressive disorder, recurrent severe without psychotic features: Secondary | ICD-10-CM | POA: Insufficient documentation

## 2017-07-11 DIAGNOSIS — Z6837 Body mass index (BMI) 37.0-37.9, adult: Secondary | ICD-10-CM | POA: Insufficient documentation

## 2017-07-11 DIAGNOSIS — F609 Personality disorder, unspecified: Secondary | ICD-10-CM | POA: Diagnosis not present

## 2017-07-11 LAB — GLUCOSE, CAPILLARY: Glucose-Capillary: 102 mg/dL — ABNORMAL HIGH (ref 65–99)

## 2017-07-11 LAB — POCT PREGNANCY, URINE: Preg Test, Ur: NEGATIVE

## 2017-07-11 MED ORDER — ESMOLOL HCL 100 MG/10ML IV SOLN
INTRAVENOUS | Status: AC
  Filled 2017-07-11: qty 10

## 2017-07-11 MED ORDER — GLYCOPYRROLATE 0.2 MG/ML IJ SOLN
INTRAMUSCULAR | Status: AC
  Administered 2017-07-11: 0.2 mg
  Filled 2017-07-11: qty 2

## 2017-07-11 MED ORDER — KETOROLAC TROMETHAMINE 30 MG/ML IJ SOLN
30.0000 mg | Freq: Once | INTRAMUSCULAR | Status: AC
  Administered 2017-07-11: 30 mg via INTRAVENOUS

## 2017-07-11 MED ORDER — SODIUM CHLORIDE 0.9 % IV SOLN
INTRAVENOUS | Status: DC | PRN
  Administered 2017-07-11: 09:00:00 via INTRAVENOUS

## 2017-07-11 MED ORDER — METHOHEXITAL SODIUM 100 MG/10ML IV SOSY
PREFILLED_SYRINGE | INTRAVENOUS | Status: DC | PRN
  Administered 2017-07-11: 70 mg via INTRAVENOUS

## 2017-07-11 MED ORDER — LABETALOL HCL 5 MG/ML IV SOLN
INTRAVENOUS | Status: AC
  Filled 2017-07-11: qty 4

## 2017-07-11 MED ORDER — GLYCOPYRROLATE 0.2 MG/ML IJ SOLN
0.4000 mg | Freq: Once | INTRAMUSCULAR | Status: AC
  Administered 2017-07-11: 0.4 mg via INTRAVENOUS

## 2017-07-11 MED ORDER — SODIUM CHLORIDE 0.9 % IV SOLN
500.0000 mL | Freq: Once | INTRAVENOUS | Status: AC
  Administered 2017-07-11: 1000 mL via INTRAVENOUS

## 2017-07-11 MED ORDER — METHOHEXITAL SODIUM 0.5 G IJ SOLR
INTRAMUSCULAR | Status: AC
  Filled 2017-07-11: qty 500

## 2017-07-11 MED ORDER — SUCCINYLCHOLINE CHLORIDE 20 MG/ML IJ SOLN
INTRAMUSCULAR | Status: AC
  Filled 2017-07-11: qty 1

## 2017-07-11 MED ORDER — KETOROLAC TROMETHAMINE 30 MG/ML IJ SOLN
INTRAMUSCULAR | Status: AC
  Administered 2017-07-11: 30 mg via INTRAVENOUS
  Filled 2017-07-11: qty 1

## 2017-07-11 MED ORDER — ESMOLOL HCL 100 MG/10ML IV SOLN
INTRAVENOUS | Status: DC | PRN
  Administered 2017-07-11: 20 mg via INTRAVENOUS

## 2017-07-11 MED ORDER — LABETALOL HCL 5 MG/ML IV SOLN
INTRAVENOUS | Status: DC | PRN
  Administered 2017-07-11: 20 mg via INTRAVENOUS

## 2017-07-11 MED ORDER — SUCCINYLCHOLINE CHLORIDE 200 MG/10ML IV SOSY
PREFILLED_SYRINGE | INTRAVENOUS | Status: DC | PRN
  Administered 2017-07-11: 100 mg via INTRAVENOUS

## 2017-07-11 NOTE — Discharge Instructions (Signed)
1)  The drugs that you have been given will stay in your system until tomorrow so for the       next 24 hours you should not:  A. Drive an automobile  B. Make any legal decisions  C. Drink any alcoholic beverages  2)  You may resume your regular meals upon return home.  3)  A responsible adult must take you home.  Someone should stay with you for a few          hours, then be available by phone for the remainder of the treatment day.  4)  You May experience any of the following symptoms:  Headache, Nausea and a dry mouth (due to the medications you were given),  temporary memory loss and some confusion, or sore muscles (a warm bath  should help this).  If you you experience any of these symptoms let us know on                your return visit.  5)  Report any of the following: any acute discomfort, severe headache, or temperature        greater than 100.5 F.   Also report any unusual redness, swelling, drainage, or pain         at your IV site.    You may report Symptoms to:  [place] at [facility]          Phone: [phone], ECT Department           or Dr. Prescott Gum office [phone]  6)  Your next ECT Treatment is Day Friday  Date July 25, 2017  We will call 2 days prior to your scheduled appointment for arrival times.  7)  Nothing to eat or drink after midnight the night before your procedure.  8)  Take .    With a sip of water the morning of your procedure.  9)  Other Instructions: Call [phone] to cancel the morning of your procedure due         to illness or emergency.  10) We will call within 72 hours to assess how you are feeling.

## 2017-07-11 NOTE — Anesthesia Post-op Follow-up Note (Signed)
Anesthesia QCDR form completed.        

## 2017-07-11 NOTE — Anesthesia Procedure Notes (Signed)
Date/Time: 07/11/2017 10:24 AM Performed by: Dionne Bucy Pre-anesthesia Checklist: Patient identified, Emergency Drugs available, Suction available and Patient being monitored Patient Re-evaluated:Patient Re-evaluated prior to induction Oxygen Delivery Method: Circle system utilized Preoxygenation: Pre-oxygenation with 100% oxygen Induction Type: IV induction Ventilation: Mask ventilation without difficulty and Mask ventilation throughout procedure Airway Equipment and Method: Bite block Placement Confirmation: positive ETCO2 Dental Injury: Teeth and Oropharynx as per pre-operative assessment

## 2017-07-11 NOTE — H&P (Signed)
Melinda [name] is an 46 y.o. female.   Chief Complaint: Patient is feeling a little worse today. It has been a rough week. She learned that her usual outpatient psychiatrist is no longer accepting her insurance. Financial problems are still a major worry. In feeling more depressed and having somewhat thoughts about cutting recently. No psychosis. HPI: Patient with recurrent severe depression which has been improved and fairly stable on his medicine and ECT  Past Medical History:  Diagnosis Date  . Depression   . Diabetes mellitus without complication ([place])   . Diabetic peripheral neuropathy ([place]) 03/07/15  . Diabetic peripheral neuropathy ([place]) 03/07/15  . Diabetic peripheral neuropathy ([place]) 03/07/15  . GERD (gastroesophageal reflux disease)   . Hypercholesterolemia 03/07/15  . Hypertension   . Obesity 03/07/15  . Personality disorder 03/07/15  . Sinus tachycardia 03/07/15   history of  . Suicidal thoughts 03/07/15    Past Surgical History:  Procedure Laterality Date  . electroconvulsion therapy  03/07/15    Family History  Problem Relation Age of Onset  . Hypertension Father   . Diabetes Mother    Social History:  reports that she has never smoked. She has never used smokeless tobacco. She reports that she does not drink alcohol or use drugs.  Allergies:  Allergies  Allergen Reactions  . Prednisone     Increases blood sugar     (Not in a hospital admission)  Results for orders placed or performed during the hospital encounter of 07/11/17 (from the past 48 hour(s))  Glucose, capillary     Status: Abnormal   Collection Time: 07/11/17  8:45 AM  Result Value Ref Range   Glucose-Capillary 102 (H) 65 - 99 mg/dL   Comment 1 Notify RN    Comment 2 Document in Chart   Pregnancy, urine POC     Status: None   Collection Time: 07/11/17  8:47 AM  Result Value Ref Range   Preg Test, Ur NEGATIVE NEGATIVE    Comment:        THE SENSITIVITY OF THIS METHODOLOGY IS >24 mIU/mL    No  results found.  Review of Systems  Constitutional: Negative.   HENT: Negative.   Eyes: Negative.   Respiratory: Negative.   Cardiovascular: Negative.   Gastrointestinal: Negative.   Musculoskeletal: Negative.   Skin: Negative.   Neurological: Negative.   Psychiatric/Behavioral: Positive for depression. Negative for hallucinations, memory loss, substance abuse and suicidal ideas. The patient is nervous/anxious. The patient does not have insomnia.     Blood pressure (!) 156/96, pulse (!) 101, temperature 98.5 F (36.9 C), temperature source Oral, resp. rate 16, height 5\' 5"  (1.651 m), weight 238 lb (108 kg), SpO2 100 %. Physical Exam  Nursing note and vitals reviewed. Constitutional: She appears well-developed and well-nourished.  HENT:  Head: Normocephalic and atraumatic.  Eyes: Pupils are equal, round, and reactive to light. Conjunctivae are normal.  Neck: Normal range of motion.  Cardiovascular: Regular rhythm and normal heart sounds.   Respiratory: Effort normal. No respiratory distress.  GI: Soft.  Musculoskeletal: Normal range of motion.  Neurological: She is alert.  Skin: Skin is warm and dry.  Psychiatric: Her speech is normal and behavior is normal. Judgment normal. Her mood appears anxious. Cognition and memory are normal. She exhibits a depressed mood. She expresses no suicidal ideation.     Assessment/Plan At the suggestion of an Furniture conservator/restorer we did a 12-lead EKG today prior to ECT treatment. EKG reviewed. The machine read  it as an old anterior infarct age undetermined and slight first-degree block with a little bit of extended PR interval. The patient herself has been asymptomatic as far as any typical cardiac symptoms. Reviewed the EKG with anesthesia. We agree that there is nothing on it that would complicate her contraindicated against her ongoing treatment. EKG will be filed in her record. Treatment today in follow-up in 2 weeks.  [name], MD 07/11/2017,  10:16 AM

## 2017-07-11 NOTE — Procedures (Signed)
ECT SERVICES Physician's Interval Evaluation & Treatment Note  Patient Identification: Melinda [name] MRN:  [id number] Date of Evaluation:  07/11/2017 TX #: [id number]  MADRS:   MMSE:   P.E. Findings:  No change to physical exam. Heart and lung exam normal. Vitals unremarkable. EKG obtained as noted in the H&P is not completely normal but does not show any acute pathology and does not remarkable and does not contraindicate against continued ECT treatment  Psychiatric Interval Note:  Mood has been more down recently she's had some significant disappointments and setbacks and is feeling a little more frustrated  Subjective:  Patient is a 46 y.o. female seen for evaluation for Electroconvulsive Therapy. Sad  Treatment Summary:   []   Right Unilateral             [x]  Bilateral   % Energy : 1.0 ms 35%   Impedance: 2430 ohms  Seizure Energy Index:    Postictal Suppression Index:    Seizure Concordance Index:    Medications  Pre Shock: Robinul 0.4 mg Toradol 30 mg labetalol 20 mg esmolol 10 mg Brevital 70 mg succinylcholine 100 mg  Post Shock:    Seizure Duration: 37 seconds by EMG 80 seconds by EEG   Comments: Follow-up in 2 weeks   Lungs:  [x]   Clear to auscultation               []  Other:   Heart:    [x]   Regular rhythm             []  irregular rhythm    [x]   Previous H&P reviewed, patient examined and there are NO CHANGES                 []   Previous H&P reviewed, patient examined and there are changes noted.   [name], MD 8/31/201810:19 AM

## 2017-07-11 NOTE — Anesthesia Postprocedure Evaluation (Signed)
Anesthesia Post Note  Patient: Melinda [name]  Procedure(s) Performed: * No procedures listed *  Patient location during evaluation: PACU Anesthesia Type: General Level of consciousness: awake and alert Pain management: pain level controlled Vital Signs Assessment: post-procedure vital signs reviewed and stable Respiratory status: spontaneous breathing, nonlabored ventilation, respiratory function stable and patient connected to nasal cannula oxygen Cardiovascular status: blood pressure returned to baseline and stable Postop Assessment: no signs of nausea or vomiting Anesthetic complications: no     Last Vitals:  Vitals:   07/11/17 1054 07/11/17 1109  BP: 121/78 (!) 141/82  Pulse: 97 96  Resp: 19 16  Temp:    SpO2: 98%     Last Pain:  Vitals:   07/11/17 1109  TempSrc:   PainSc: 3                  Precious Haws Norva Bowe

## 2017-07-11 NOTE — Anesthesia Preprocedure Evaluation (Signed)
Anesthesia Evaluation  Patient identified by MRN, date of birth, ID band Patient awake    Reviewed: Allergy & Precautions, H&P , NPO status , Patient's Chart, lab work & pertinent test results  History of Anesthesia Complications Negative for: history of anesthetic complications  Airway Mallampati: II  TM Distance: >3 FB Neck ROM: full    Dental  (+) Poor Dentition, Chipped   Pulmonary sleep apnea , neg COPD,    Pulmonary exam normal breath sounds clear to auscultation       Cardiovascular hypertension, Pt. on medications (-) CAD and (-) Past MI negative cardio ROS Normal cardiovascular exam Rhythm:regular Rate:Normal     Neuro/Psych PSYCHIATRIC DISORDERS Depression Bipolar Disorder  Neuromuscular disease negative neurological ROS     GI/Hepatic negative GI ROS, Neg liver ROS, GERD  Controlled,  Endo/Other  diabetes, Type 2, Oral Hypoglycemic Agents  Renal/GU negative Renal ROS  negative genitourinary   Musculoskeletal   Abdominal (+) + obese,   Peds  Hematology negative hematology ROS (+)   Anesthesia Other Findings Past Medical History: No date: Depression No date: Diabetes mellitus without complication ([facility]) 03/07/15: Diabetic peripheral neuropathy ([facility]) 03/07/15: Diabetic peripheral neuropathy ([facility]) 03/07/15: Diabetic peripheral neuropathy ([facility]) No date: GERD (gastroesophageal reflux disease) 03/07/15: Hypercholesterolemia No date: Hypertension 03/07/15: Obesity 03/07/15: Personality disorder 03/07/15: Sinus tachycardia ([facility])     Comment: history of 03/07/15: Suicidal thoughts Past Surgical History: 03/07/15: electroconvulsion therapy BMI    Body Mass Index:  36.44 kg/m     Reproductive/Obstetrics                             Anesthesia Physical  Anesthesia Plan  ASA: III  Anesthesia Plan: General   Post-op Pain Management:    Induction: Intravenous  PONV Risk Score and  Plan:   Airway Management Planned: Mask  Additional Equipment:   Intra-op Plan:   Post-operative Plan:   Informed Consent: I have reviewed the patients History and Physical, chart, labs and discussed the procedure including the risks, benefits and alternatives for the proposed anesthesia with the patient or authorized representative who has indicated his/her understanding and acceptance.   Dental Advisory Given  Plan Discussed with: CRNA and Anesthesiologist  Anesthesia Plan Comments: (Patient consented for risks of anesthesia including but not limited to:  - adverse reactions to medications - damage to teeth, lips or other oral mucosa - sore throat or hoarseness - Damage to heart, brain, lungs or loss of life  Patient voiced understanding.)        Anesthesia Quick Evaluation  

## 2017-07-11 NOTE — Transfer of Care (Signed)
Immediate Anesthesia Transfer of Care Note  Patient: Melinda [name]  Procedure(s) Performed: ECT  Patient Location: PACU  Anesthesia Type:General  Level of Consciousness: sedated  Airway & Oxygen Therapy: Patient Spontanous Breathing  Post-op Assessment: Report given to RN and Post -op Vital signs reviewed and stable  Post vital signs: Reviewed and stable  Last Vitals:  Vitals:   07/11/17 0835  BP: (!) 156/96  Pulse: (!) 101  Resp: 16  Temp: 36.9 C  SpO2: 100%    Last Pain:  Vitals:   07/11/17 0835  TempSrc: Oral  PainSc: 0-No pain         Complications: No apparent anesthesia complications

## 2017-07-16 ENCOUNTER — Telehealth: Payer: Self-pay | Admitting: *Deleted

## 2017-07-16 ENCOUNTER — Telehealth: Payer: Self-pay

## 2017-07-18 ENCOUNTER — Telehealth (HOSPITAL_COMMUNITY): Payer: Self-pay | Admitting: *Deleted

## 2017-07-18 ENCOUNTER — Encounter
Admission: RE | Admit: 2017-07-18 | Discharge: 2017-07-18 | Disposition: A | Discharge status: Home / Self Care | Payer: Medicare PPO | Source: Ambulatory Visit | Attending: Psychiatry | Admitting: Psychiatry

## 2017-07-18 ENCOUNTER — Ambulatory Visit
Admission: RE | Admit: 2017-07-18 | Discharge: 2017-07-18 | Disposition: A | Discharge status: Home / Self Care | Payer: Medicare PPO | Source: Ambulatory Visit | Attending: Psychiatry | Admitting: Psychiatry

## 2017-07-18 DIAGNOSIS — Z026 Encounter for examination for insurance purposes: Secondary | ICD-10-CM

## 2017-07-18 DIAGNOSIS — R918 Other nonspecific abnormal finding of lung field: Secondary | ICD-10-CM | POA: Diagnosis not present

## 2017-07-18 LAB — CBC
HCT: 37.2 % (ref 35.0–47.0)
Hemoglobin: 12.5 g/dL (ref 12.0–16.0)
MCH: 27.4 pg (ref 26.0–34.0)
MCHC: 33.6 g/dL (ref 32.0–36.0)
MCV: 81.4 fL (ref 80.0–100.0)
Platelets: 250 10*3/uL (ref 150–440)
RBC: 4.57 MIL/uL (ref 3.80–5.20)
RDW: 13.4 % (ref 11.5–14.5)
WBC: 7.9 10*3/uL (ref 3.6–11.0)

## 2017-07-23 ENCOUNTER — Telehealth: Payer: Self-pay | Admitting: *Deleted

## 2017-07-25 ENCOUNTER — Telehealth (HOSPITAL_COMMUNITY): Payer: Self-pay | Admitting: *Deleted

## 2017-07-25 NOTE — Telephone Encounter (Signed)
Called for continued authorization of ECT. Called [phone] spoke with Loma Sousa who gave 8 more sessions beginning 07/12/17-11/10/17. Authorization #[id number].

## 2017-07-28 ENCOUNTER — Telehealth: Payer: Self-pay | Admitting: Family Medicine

## 2017-07-31 ENCOUNTER — Other Ambulatory Visit: Payer: Self-pay | Admitting: Psychiatry

## 2017-08-01 ENCOUNTER — Encounter: Payer: Self-pay | Admitting: Anesthesiology

## 2017-08-01 ENCOUNTER — Encounter
Admission: RE | Admit: 2017-08-01 | Discharge: 2017-08-01 | Disposition: A | Discharge status: Home / Self Care | Payer: Medicare PPO | Source: Ambulatory Visit | Attending: Psychiatry | Admitting: Psychiatry

## 2017-08-01 DIAGNOSIS — E7800 Pure hypercholesterolemia, unspecified: Secondary | ICD-10-CM | POA: Insufficient documentation

## 2017-08-01 DIAGNOSIS — E1142 Type 2 diabetes mellitus with diabetic polyneuropathy: Secondary | ICD-10-CM | POA: Diagnosis not present

## 2017-08-01 DIAGNOSIS — E119 Type 2 diabetes mellitus without complications: Secondary | ICD-10-CM | POA: Diagnosis not present

## 2017-08-01 DIAGNOSIS — F339 Major depressive disorder, recurrent, unspecified: Secondary | ICD-10-CM | POA: Diagnosis not present

## 2017-08-01 DIAGNOSIS — F329 Major depressive disorder, single episode, unspecified: Secondary | ICD-10-CM | POA: Diagnosis not present

## 2017-08-01 DIAGNOSIS — K219 Gastro-esophageal reflux disease without esophagitis: Secondary | ICD-10-CM | POA: Insufficient documentation

## 2017-08-01 DIAGNOSIS — E669 Obesity, unspecified: Secondary | ICD-10-CM | POA: Insufficient documentation

## 2017-08-01 DIAGNOSIS — I10 Essential (primary) hypertension: Secondary | ICD-10-CM | POA: Diagnosis not present

## 2017-08-01 DIAGNOSIS — F419 Anxiety disorder, unspecified: Secondary | ICD-10-CM | POA: Insufficient documentation

## 2017-08-01 DIAGNOSIS — F609 Personality disorder, unspecified: Secondary | ICD-10-CM | POA: Diagnosis not present

## 2017-08-01 DIAGNOSIS — F332 Major depressive disorder, recurrent severe without psychotic features: Secondary | ICD-10-CM

## 2017-08-01 LAB — POCT PREGNANCY, URINE: Preg Test, Ur: NEGATIVE

## 2017-08-01 LAB — GLUCOSE, CAPILLARY
Glucose-Capillary: 82 mg/dL (ref 65–99)
Glucose-Capillary: 108 mg/dL — ABNORMAL HIGH (ref 65–99)

## 2017-08-01 MED ORDER — FENTANYL CITRATE (PF) 100 MCG/2ML IJ SOLN: 25.0000 ug | INTRAMUSCULAR | Status: DC | PRN

## 2017-08-01 MED ORDER — KETOROLAC TROMETHAMINE 30 MG/ML IJ SOLN
INTRAMUSCULAR | Status: AC
  Filled 2017-08-01: qty 1

## 2017-08-01 MED ORDER — ESMOLOL HCL 100 MG/10ML IV SOLN
INTRAVENOUS | Status: AC
  Filled 2017-08-01: qty 10

## 2017-08-01 MED ORDER — ESMOLOL HCL 100 MG/10ML IV SOLN
INTRAVENOUS | Status: DC | PRN
  Administered 2017-08-01: 20 mg via INTRAVENOUS

## 2017-08-01 MED ORDER — LABETALOL HCL 5 MG/ML IV SOLN
INTRAVENOUS | Status: DC | PRN
  Administered 2017-08-01: 20 mg via INTRAVENOUS

## 2017-08-01 MED ORDER — SODIUM CHLORIDE 0.9 % IV SOLN
500.0000 mL | Freq: Once | INTRAVENOUS | Status: AC
  Administered 2017-08-01: 500 mL via INTRAVENOUS

## 2017-08-01 MED ORDER — GLYCOPYRROLATE 0.2 MG/ML IJ SOLN
0.4000 mg | Freq: Once | INTRAMUSCULAR | Status: AC
  Administered 2017-08-01: 0.4 mg via INTRAVENOUS

## 2017-08-01 MED ORDER — LABETALOL HCL 5 MG/ML IV SOLN
INTRAVENOUS | Status: AC
  Filled 2017-08-01: qty 4

## 2017-08-01 MED ORDER — SUCCINYLCHOLINE CHLORIDE 20 MG/ML IJ SOLN
INTRAMUSCULAR | Status: AC
  Filled 2017-08-01: qty 1

## 2017-08-01 MED ORDER — SODIUM CHLORIDE 0.9 % IV SOLN
INTRAVENOUS | Status: DC | PRN
  Administered 2017-08-01: 09:00:00 via INTRAVENOUS

## 2017-08-01 MED ORDER — KETOROLAC TROMETHAMINE 30 MG/ML IJ SOLN
30.0000 mg | Freq: Once | INTRAMUSCULAR | Status: AC
  Administered 2017-08-01: 30 mg via INTRAVENOUS

## 2017-08-01 MED ORDER — METHOHEXITAL SODIUM 100 MG/10ML IV SOSY
PREFILLED_SYRINGE | INTRAVENOUS | Status: DC | PRN
  Administered 2017-08-01: 70 mg via INTRAVENOUS

## 2017-08-01 MED ORDER — GLYCOPYRROLATE 0.2 MG/ML IJ SOLN
INTRAMUSCULAR | Status: AC
  Filled 2017-08-01: qty 2

## 2017-08-01 MED ORDER — ONDANSETRON HCL 4 MG/2ML IJ SOLN: 4.0000 mg | Freq: Once | INTRAMUSCULAR | Status: DC | PRN

## 2017-08-01 NOTE — Anesthesia Post-op Follow-up Note (Signed)
Anesthesia QCDR form completed.        

## 2017-08-01 NOTE — Procedures (Signed)
ECT SERVICES Physician's Interval Evaluation & Treatment Note  Patient Identification: Melinda [name] MRN:  [id number] Date of Evaluation:  08/01/2017 TX #: [id number]  MADRS:   MMSE:   P.E. Findings:  Vitals normal heart and lungs clear  Psychiatric Interval Note:  Mood expressed to me is feeling depressed with thoughts of self-mutilation although she has shown a full range of affect  Subjective:  Patient is a 46 y.o. female seen for evaluation for Electroconvulsive Therapy. Feeling chronic depression and stress  Treatment Summary:   []   Right Unilateral             [x]  Bilateral   % Energy : 1.0 ms 35%   Impedance: 1270 ohms  Seizure Energy Index: 2597 V squared  Postictal Suppression Index: 92%  Seizure Concordance Index: 91%  Medications  Pre Shock: Robinul 0.4 mg esmolol 10 mg labetalol 20 mg Toradol 30 mg Brevital 70 mg succinylcholine 100 mg  Post Shock:    Seizure Duration: EMG did not produce a reading EEG 82 seconds   Comments: Follow-up in 1 week   Lungs:  [x]   Clear to auscultation               []  Other:   Heart:    [x]   Regular rhythm             []  irregular rhythm    [x]   Previous H&P reviewed, patient examined and there are NO CHANGES                 []   Previous H&P reviewed, patient examined and there are changes noted.   [name], MD 9/21/201810:44 AM

## 2017-08-01 NOTE — Transfer of Care (Signed)
Immediate Anesthesia Transfer of Care Note  Patient: Melinda [name]  Procedure(s) Performed: ECT  Patient Location: PACU  Anesthesia Type:General  Level of Consciousness: sedated  Airway & Oxygen Therapy: Patient Spontanous Breathing and Patient connected to face mask oxygen  Post-op Assessment: Report given to RN and Post -op Vital signs reviewed and stable  Post vital signs: Reviewed and stable  Last Vitals:  Vitals:   08/01/17 0831 08/01/17 1100  BP: (!) 152/81 (!) 122/100  Pulse: 79   Resp: 16 12  Temp: 36.7 C 36.6 C  SpO2: 100%     Last Pain:  Vitals:   08/01/17 0831  TempSrc: Oral         Complications: No apparent anesthesia complications
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
## 2017-08-01 NOTE — Discharge Instructions (Signed)
1)  The drugs that you have been given will stay in your system until tomorrow so for the       next 24 hours you should not:  A. Drive an automobile  B. Make any legal decisions  C. Drink any alcoholic beverages  2)  You may resume your regular meals upon return home.  3)  A responsible adult must take you home.  Someone should stay with you for a few          hours, then be available by phone for the remainder of the treatment day.  4)  You May experience any of the following symptoms:  Headache, Nausea and a dry mouth (due to the medications you were given),  temporary memory loss and some confusion, or sore muscles (a warm bath  should help this).  If you you experience any of these symptoms let us know on                your return visit.  5)  Report any of the following: any acute discomfort, severe headache, or temperature        greater than 100.5 F.   Also report any unusual redness, swelling, drainage, or pain         at your IV site.    You may report Symptoms to:  [place] [facility]          Phone: [phone], ECT Department           or Dr. Prescott Gum office [phone]  6)  Your next ECT Treatment is Day Friday, August 08, 2017 at 8am  We will call 2 days prior to your scheduled appointment for arrival times.  7)  Nothing to eat or drink after midnight the night before your procedure.  8)  Take .   With a sip of water the morning of your procedure.  9)  Other Instructions: Call [phone] to cancel the morning of your procedure due         to illness or emergency.  10) We will call within 72 hours to assess how you are feeling.

## 2017-08-01 NOTE — Anesthesia Preprocedure Evaluation (Signed)
Anesthesia Evaluation  Patient identified by MRN, date of birth, ID band Patient awake    Reviewed: Allergy & Precautions, H&P , NPO status , Patient's Chart, lab work & pertinent test results, reviewed documented beta blocker date and time   Airway Mallampati: III   Neck ROM: full    Dental  (+) Teeth Intact   Pulmonary neg pulmonary ROS, sleep apnea ,    Pulmonary exam normal        Cardiovascular hypertension, negative cardio ROS Normal cardiovascular exam Rhythm:regular Rate:Normal     Neuro/Psych PSYCHIATRIC DISORDERS  Neuromuscular disease negative neurological ROS  negative psych ROS   GI/Hepatic negative GI ROS, Neg liver ROS, GERD  Medicated,  Endo/Other  negative endocrine ROSdiabetesMorbid obesity  Renal/GU negative Renal ROS  negative genitourinary   Musculoskeletal   Abdominal   Peds  Hematology negative hematology ROS (+)   Anesthesia Other Findings Past Medical History: No date: Depression No date: Diabetes mellitus without complication ([place]) 7/51/02: Diabetic peripheral neuropathy ([place]) 03/07/15: Diabetic peripheral neuropathy ([place]) 03/07/15: Diabetic peripheral neuropathy ([facility]) No date: GERD (gastroesophageal reflux disease) 03/07/15: Hypercholesterolemia No date: Hypertension 03/07/15: Obesity 03/07/15: Personality disorder 03/07/15: Sinus tachycardia     Comment:  history of 03/07/15: Suicidal thoughts Past Surgical History: 03/07/15: electroconvulsion therapy BMI    Body Mass Index:  39.61 kg/m     Reproductive/Obstetrics negative OB ROS                             Anesthesia Physical Anesthesia Plan  ASA: III  Anesthesia Plan: General   Post-op Pain Management:    Induction:   PONV Risk Score and Plan:   Airway Management Planned:   Additional Equipment:   Intra-op Plan:   Post-operative Plan:   Informed Consent: I have reviewed the patients  History and Physical, chart, labs and discussed the procedure including the risks, benefits and alternatives for the proposed anesthesia with the patient or authorized representative who has indicated his/her understanding and acceptance.   Dental Advisory Given  Plan Discussed with: CRNA  Anesthesia Plan Comments:         Anesthesia Quick Evaluation

## 2017-08-04 NOTE — Anesthesia Postprocedure Evaluation (Signed)
Anesthesia Post Note  Patient: Melinda [name]  Procedure(s) Performed: * No procedures listed *  Patient location during evaluation: PACU Anesthesia Type: General Level of consciousness: awake and alert Pain management: pain level controlled Vital Signs Assessment: post-procedure vital signs reviewed and stable Respiratory status: spontaneous breathing, nonlabored ventilation, respiratory function stable and patient connected to nasal cannula oxygen Cardiovascular status: stable and blood pressure returned to baseline Postop Assessment: no apparent nausea or vomiting Anesthetic complications: no     Last Vitals:  Vitals:   08/01/17 1125 08/01/17 1150  BP: 121/71 (!) 124/92  Pulse: 78 68  Resp: (!) 21 18  Temp: 36.7 C   SpO2: 98%     Last Pain:  Vitals:   08/01/17 1125  TempSrc:   PainSc: 0-No pain                 Molli Barrows

## 2017-08-06 ENCOUNTER — Telehealth: Payer: Self-pay | Admitting: *Deleted

## 2017-08-08 ENCOUNTER — Encounter: Payer: Self-pay | Admitting: Anesthesiology

## 2017-08-08 ENCOUNTER — Telehealth: Payer: Self-pay | Admitting: *Deleted

## 2017-08-08 ENCOUNTER — Encounter (HOSPITAL_BASED_OUTPATIENT_CLINIC_OR_DEPARTMENT_OTHER)
Admission: RE | Admit: 2017-08-08 | Discharge: 2017-08-08 | Disposition: A | Discharge status: Home / Self Care | Payer: Medicare PPO | Source: Ambulatory Visit | Attending: Psychiatry | Admitting: Psychiatry

## 2017-08-08 DIAGNOSIS — F419 Anxiety disorder, unspecified: Secondary | ICD-10-CM | POA: Diagnosis not present

## 2017-08-08 DIAGNOSIS — F329 Major depressive disorder, single episode, unspecified: Secondary | ICD-10-CM | POA: Diagnosis not present

## 2017-08-08 DIAGNOSIS — E1142 Type 2 diabetes mellitus with diabetic polyneuropathy: Secondary | ICD-10-CM | POA: Diagnosis not present

## 2017-08-08 DIAGNOSIS — K219 Gastro-esophageal reflux disease without esophagitis: Secondary | ICD-10-CM | POA: Diagnosis not present

## 2017-08-08 DIAGNOSIS — E669 Obesity, unspecified: Secondary | ICD-10-CM | POA: Diagnosis not present

## 2017-08-08 DIAGNOSIS — F332 Major depressive disorder, recurrent severe without psychotic features: Secondary | ICD-10-CM | POA: Diagnosis not present

## 2017-08-08 DIAGNOSIS — I10 Essential (primary) hypertension: Secondary | ICD-10-CM | POA: Diagnosis not present

## 2017-08-08 DIAGNOSIS — Z6841 Body Mass Index (BMI) 40.0 and over, adult: Secondary | ICD-10-CM | POA: Diagnosis not present

## 2017-08-08 DIAGNOSIS — F609 Personality disorder, unspecified: Secondary | ICD-10-CM | POA: Diagnosis not present

## 2017-08-08 DIAGNOSIS — F319 Bipolar disorder, unspecified: Secondary | ICD-10-CM | POA: Diagnosis not present

## 2017-08-08 DIAGNOSIS — E7800 Pure hypercholesterolemia, unspecified: Secondary | ICD-10-CM | POA: Diagnosis not present

## 2017-08-08 DIAGNOSIS — F339 Major depressive disorder, recurrent, unspecified: Secondary | ICD-10-CM | POA: Diagnosis not present

## 2017-08-08 LAB — POCT PREGNANCY, URINE: Preg Test, Ur: NEGATIVE

## 2017-08-08 MED ORDER — METHOHEXITAL SODIUM 0.5 G IJ SOLR
INTRAMUSCULAR | Status: AC
  Filled 2017-08-08: qty 500

## 2017-08-08 MED ORDER — LABETALOL HCL 5 MG/ML IV SOLN
INTRAVENOUS | Status: AC
  Filled 2017-08-08: qty 4

## 2017-08-08 MED ORDER — SUCCINYLCHOLINE CHLORIDE 20 MG/ML IJ SOLN
INTRAMUSCULAR | Status: AC
  Filled 2017-08-08: qty 1

## 2017-08-08 MED ORDER — ESMOLOL HCL 100 MG/10ML IV SOLN
INTRAVENOUS | Status: DC | PRN
  Administered 2017-08-08: 20 mg via INTRAVENOUS

## 2017-08-08 MED ORDER — ESMOLOL HCL 100 MG/10ML IV SOLN
INTRAVENOUS | Status: AC
  Filled 2017-08-08: qty 10

## 2017-08-08 MED ORDER — GLYCOPYRROLATE 0.2 MG/ML IJ SOLN
0.4000 mg | Freq: Once | INTRAMUSCULAR | Status: AC
  Administered 2017-08-08: 0.4 mg via INTRAVENOUS

## 2017-08-08 MED ORDER — SODIUM CHLORIDE 0.9 % IV SOLN
INTRAVENOUS | Status: DC | PRN
  Administered 2017-08-08: 10:00:00 via INTRAVENOUS

## 2017-08-08 MED ORDER — SODIUM CHLORIDE 0.9 % IV SOLN
500.0000 mL | Freq: Once | INTRAVENOUS | Status: AC
  Administered 2017-08-08: 500 mL via INTRAVENOUS

## 2017-08-08 MED ORDER — KETOROLAC TROMETHAMINE 30 MG/ML IJ SOLN
30.0000 mg | Freq: Once | INTRAMUSCULAR | Status: AC
  Administered 2017-08-08: 30 mg via INTRAVENOUS

## 2017-08-08 MED ORDER — KETOROLAC TROMETHAMINE 30 MG/ML IJ SOLN
INTRAMUSCULAR | Status: AC
  Administered 2017-08-08: 30 mg via INTRAVENOUS
  Filled 2017-08-08: qty 1

## 2017-08-08 MED ORDER — METHOHEXITAL SODIUM 100 MG/10ML IV SOSY
PREFILLED_SYRINGE | INTRAVENOUS | Status: DC | PRN
  Administered 2017-08-08: 70 mg via INTRAVENOUS

## 2017-08-08 MED ORDER — GLYCOPYRROLATE 0.2 MG/ML IJ SOLN
INTRAMUSCULAR | Status: AC
  Filled 2017-08-08: qty 2

## 2017-08-08 MED ORDER — LABETALOL HCL 5 MG/ML IV SOLN
INTRAVENOUS | Status: DC | PRN
  Administered 2017-08-08: 20 mg via INTRAVENOUS

## 2017-08-08 MED ORDER — SUCCINYLCHOLINE CHLORIDE 20 MG/ML IJ SOLN
INTRAMUSCULAR | Status: DC | PRN
  Administered 2017-08-08: 100 mg via INTRAVENOUS

## 2017-08-08 NOTE — Anesthesia Postprocedure Evaluation (Signed)
Anesthesia Post Note  Patient: Melinda [name]  Procedure(s) Performed: * No procedures listed *  Patient location during evaluation: PACU Anesthesia Type: General Level of consciousness: awake and alert Pain management: pain level controlled Vital Signs Assessment: post-procedure vital signs reviewed and stable Respiratory status: spontaneous breathing, nonlabored ventilation and respiratory function stable Cardiovascular status: blood pressure returned to baseline and stable Postop Assessment: no signs of nausea or vomiting Anesthetic complications: no     Last Vitals:  Vitals:   08/08/17 1105 08/08/17 1114  BP:  (!) 145/78  Pulse: 96 90  Resp: (!) 22 18  Temp:    SpO2: 95%     Last Pain: There were no vitals filed for this visit.               Kalah Pflum

## 2017-08-08 NOTE — Transfer of Care (Signed)
Immediate Anesthesia Transfer of Care Note  Patient: Melinda [name]  Procedure(s) Performed: * No procedures listed *  Patient Location: PACU  Anesthesia Type:General  Level of Consciousness: awake and alert   Airway & Oxygen Therapy: Patient Spontanous Breathing and Patient connected to face mask oxygen  Post-op Assessment: Report given to RN and Post -op Vital signs reviewed and stable  Post vital signs: Reviewed and stable  Last Vitals:  Vitals:   08/08/17 0834 08/08/17 1035  BP: 120/83   Pulse: (!) 106 100  Resp: 18 20  Temp: 36.7 C   SpO2: 100% 96%    Last Pain: There were no vitals filed for this visit.       Complications: No apparent anesthesia complications

## 2017-08-08 NOTE — Anesthesia Post-op Follow-up Note (Signed)
Anesthesia QCDR form completed.        

## 2017-08-08 NOTE — H&P (Signed)
Melinda [name] is an 46 y.o. female.   Chief Complaint: Continued depression and thoughts about cutting herself but nothing really out of the ordinary. Wants to discuss a dream today. HPI: Chronic depression which has shown stability with ECT we will be going back to a 2 week schedule  Past Medical History:  Diagnosis Date  . Depression   . Diabetes mellitus without complication ([place])   . Diabetic peripheral neuropathy ([place]) 03/07/15  . Diabetic peripheral neuropathy ([place]) 03/07/15  . Diabetic peripheral neuropathy ([place]) 03/07/15  . GERD (gastroesophageal reflux disease)   . Hypercholesterolemia 03/07/15  . Hypertension   . Obesity 03/07/15  . Personality disorder 03/07/15  . Sinus tachycardia 03/07/15   history of  . Suicidal thoughts 03/07/15    Past Surgical History:  Procedure Laterality Date  . electroconvulsion therapy  03/07/15    Family History  Problem Relation Age of Onset  . Hypertension Father   . Diabetes Mother    Social History:  reports that she has never smoked. She has never used smokeless tobacco. She reports that she does not drink alcohol or use drugs.  Allergies:  Allergies  Allergen Reactions  . Prednisone     Increases blood sugar     (Not in a hospital admission)  Results for orders placed or performed during the hospital encounter of 08/08/17 (from the past 48 hour(s))  Pregnancy, urine POC     Status: None   Collection Time: 08/08/17  8:36 AM  Result Value Ref Range   Preg Test, Ur NEGATIVE NEGATIVE    Comment:        THE SENSITIVITY OF THIS METHODOLOGY IS >24 mIU/mL    No results found.  Review of Systems  Constitutional: Negative.   HENT: Negative.   Eyes: Negative.   Respiratory: Negative.   Cardiovascular: Negative.   Gastrointestinal: Negative.   Musculoskeletal: Negative.   Skin: Negative.   Neurological: Negative.   Psychiatric/Behavioral: Positive for depression. Negative for hallucinations, memory loss, substance abuse and  suicidal ideas. The patient is not nervous/anxious and does not have insomnia.     Blood pressure 120/83, pulse (!) 106, temperature 98.1 F (36.7 C), resp. rate 18, height 5\' 5"  (1.651 m), weight 109.3 kg (241 lb), last menstrual period 07/13/2017, SpO2 100 %. Physical Exam  Nursing note and vitals reviewed. Constitutional: She appears well-developed and well-nourished.  HENT:  Head: Normocephalic and atraumatic.  Eyes: Pupils are equal, round, and reactive to light. Conjunctivae are normal.  Neck: Normal range of motion.  Cardiovascular: Regular rhythm and normal heart sounds.   Respiratory: Effort normal and breath sounds normal.  GI: Soft.  Musculoskeletal: Normal range of motion.  Neurological: She is alert.  Skin: Skin is warm and dry.  Psychiatric: Judgment normal. Her affect is blunt. Her speech is delayed. She is slowed. Cognition and memory are normal. She expresses no suicidal ideation.     Assessment/Plan Treatment today but then go back to the every 2 week schedule after this  [name], MD 08/08/2017, 10:00 AM

## 2017-08-08 NOTE — Anesthesia Preprocedure Evaluation (Signed)
Anesthesia Evaluation  Patient identified by MRN, date of birth, ID band Patient awake    Reviewed: Allergy & Precautions, H&P , NPO status , Patient's Chart, lab work & pertinent test results  History of Anesthesia Complications Negative for: history of anesthetic complications  Airway Mallampati: II  TM Distance: >3 FB Neck ROM: full    Dental  (+) Poor Dentition, Chipped   Pulmonary sleep apnea , neg COPD,    Pulmonary exam normal breath sounds clear to auscultation       Cardiovascular hypertension, Pt. on medications (-) CAD and (-) Past MI negative cardio ROS Normal cardiovascular exam Rhythm:regular Rate:Normal     Neuro/Psych PSYCHIATRIC DISORDERS Depression Bipolar Disorder  Neuromuscular disease negative neurological ROS     GI/Hepatic negative GI ROS, Neg liver ROS, GERD  Medicated,  Endo/Other  diabetes, Type 2, Oral Hypoglycemic Agents  Renal/GU negative Renal ROS  negative genitourinary   Musculoskeletal   Abdominal (+) + obese,   Peds  Hematology negative hematology ROS (+)   Anesthesia Other Findings Past Medical History: No date: Depression No date: Diabetes mellitus without complication ([facility]) 03/07/15: Diabetic peripheral neuropathy ([facility]) 03/07/15: Diabetic peripheral neuropathy ([facility]) 03/07/15: Diabetic peripheral neuropathy ([facility]) No date: GERD (gastroesophageal reflux disease) 03/07/15: Hypercholesterolemia No date: Hypertension 03/07/15: Obesity 03/07/15: Personality disorder 03/07/15: Sinus tachycardia ([facility])     Comment: history of 03/07/15: Suicidal thoughts Past Surgical History: 03/07/15: electroconvulsion therapy BMI    Body Mass Index:  36.44 kg/m     Reproductive/Obstetrics                             Anesthesia Physical  Anesthesia Plan  ASA: III  Anesthesia Plan: General   Post-op Pain Management:    Induction: Intravenous  PONV Risk Score and  Plan: 2 and Ondansetron  Airway Management Planned: Mask  Additional Equipment:   Intra-op Plan:   Post-operative Plan:   Informed Consent: I have reviewed the patients History and Physical, chart, labs and discussed the procedure including the risks, benefits and alternatives for the proposed anesthesia with the patient or authorized representative who has indicated his/her understanding and acceptance.     Dental Advisory Given  Plan Discussed with: CRNA and Anesthesiologist  Anesthesia Plan Comments:         Anesthesia Quick Evaluation  

## 2017-08-08 NOTE — Discharge Instructions (Signed)
1)  The drugs that you have been given will stay in your system until tomorrow so for the       next 24 hours you should not:  A. Drive an automobile  B. Make any legal decisions  C. Drink any alcoholic beverages  2)  You may resume your regular meals upon return home.  3)  A responsible adult must take you home.  Someone should stay with you for a few          hours, then be available by phone for the remainder of the treatment day.  4)  You May experience any of the following symptoms:  Headache, Nausea and a dry mouth (due to the medications you were given),  temporary memory loss and some confusion, or sore muscles (a warm bath  should help this).  If you you experience any of these symptoms let us know on                your return visit.  5)  Report any of the following: any acute discomfort, severe headache, or temperature        greater than 100.5 F.   Also report any unusual redness, swelling, drainage, or pain         at your IV site.    You may report Symptoms to:  [facility] [place] at [facility]          Phone: [phone], ECT Department           or Dr. Prescott Gum office [phone]  6)  Your next ECT Treatment is Day Wednesday Date August 20, 2017  We will call 2 days prior to your scheduled appointment for arrival times.  7)  Nothing to eat or drink after midnight the night before your procedure.  8)  Take .   With a sip of water the morning of your procedure.  9)  Other Instructions: Call [phone] to cancel the morning of your procedure due         to illness or emergency.  10) We will call within 72 hours to assess how you are feeling.

## 2017-08-08 NOTE — Procedures (Signed)
ECT SERVICES Physician's Interval Evaluation & Treatment Note  Patient Identification: Melinda [name] MRN:  [id number] Date of Evaluation:  08/08/2017 TX #: [id number]  MADRS:   MMSE:   P.E. Findings:  No change to physical exam heart and lungs normal vitals unremarkable.  Psychiatric Interval Note:  sual levels of anxiety nothing worse no recent suicidal behavior  Subjective:  Patient is a 46 y.o. female seen for evaluation for Electroconvulsive Therapy. Chronic anxiety and depression  Treatment Summary:   []   Right Unilateral             [x]  Bilateral   % Energy : 1.0 ms 35%   Impedance: 1440 ohms  Seizure Energy Index: 12,094 V squared  Postictal Suppression Index: 40%  Seizure Concordance Index: 98%  Medications  Pre Shock: Robinul 0.4 mg labetalol 20 mg esmolol 10 mg Toradol 30 mg Brevital 70 mg succinylcholine 100 mg  Post Shock:    Seizure Duration: 50 seconds by EMG 100 seconds by EEG   Comments: we will follow-up in a week and a half a week from this upcoming Wednesday.   Lungs:  [x]   Clear to auscultation               []  Other:   Heart:    [x]   Regular rhythm             []  irregular rhythm    [x]   Previous H&P reviewed, patient examined and there are NO CHANGES                 []   Previous H&P reviewed, patient examined and there are changes noted.   [name], MD 9/28/201810:17 AM

## 2017-08-13 ENCOUNTER — Telehealth: Payer: Self-pay

## 2017-08-19 ENCOUNTER — Other Ambulatory Visit: Payer: Self-pay | Admitting: Psychiatry

## 2017-08-20 ENCOUNTER — Encounter
Admission: RE | Admit: 2017-08-20 | Discharge: 2017-08-20 | Disposition: A | Discharge status: Home / Self Care | Payer: Medicare PPO | Source: Ambulatory Visit | Attending: Psychiatry | Admitting: Psychiatry

## 2017-08-20 ENCOUNTER — Encounter: Payer: Self-pay | Admitting: Anesthesiology

## 2017-08-20 DIAGNOSIS — E7800 Pure hypercholesterolemia, unspecified: Secondary | ICD-10-CM | POA: Diagnosis not present

## 2017-08-20 DIAGNOSIS — E1142 Type 2 diabetes mellitus with diabetic polyneuropathy: Secondary | ICD-10-CM | POA: Insufficient documentation

## 2017-08-20 DIAGNOSIS — F419 Anxiety disorder, unspecified: Secondary | ICD-10-CM | POA: Insufficient documentation

## 2017-08-20 DIAGNOSIS — Z833 Family history of diabetes mellitus: Secondary | ICD-10-CM | POA: Diagnosis not present

## 2017-08-20 DIAGNOSIS — K219 Gastro-esophageal reflux disease without esophagitis: Secondary | ICD-10-CM | POA: Insufficient documentation

## 2017-08-20 DIAGNOSIS — F329 Major depressive disorder, single episode, unspecified: Secondary | ICD-10-CM | POA: Insufficient documentation

## 2017-08-20 DIAGNOSIS — F332 Major depressive disorder, recurrent severe without psychotic features: Secondary | ICD-10-CM

## 2017-08-20 DIAGNOSIS — Z3202 Encounter for pregnancy test, result negative: Secondary | ICD-10-CM | POA: Diagnosis not present

## 2017-08-20 DIAGNOSIS — Z8249 Family history of ischemic heart disease and other diseases of the circulatory system: Secondary | ICD-10-CM | POA: Diagnosis not present

## 2017-08-20 DIAGNOSIS — E669 Obesity, unspecified: Secondary | ICD-10-CM | POA: Diagnosis not present

## 2017-08-20 DIAGNOSIS — I10 Essential (primary) hypertension: Secondary | ICD-10-CM | POA: Insufficient documentation

## 2017-08-20 DIAGNOSIS — Z885 Allergy status to narcotic agent status: Secondary | ICD-10-CM | POA: Diagnosis not present

## 2017-08-20 DIAGNOSIS — E119 Type 2 diabetes mellitus without complications: Secondary | ICD-10-CM | POA: Diagnosis not present

## 2017-08-20 DIAGNOSIS — Z9889 Other specified postprocedural states: Secondary | ICD-10-CM | POA: Diagnosis not present

## 2017-08-20 DIAGNOSIS — F319 Bipolar disorder, unspecified: Secondary | ICD-10-CM | POA: Diagnosis not present

## 2017-08-20 LAB — POCT PREGNANCY, URINE: Preg Test, Ur: NEGATIVE

## 2017-08-20 LAB — GLUCOSE, CAPILLARY: Glucose-Capillary: 112 mg/dL — ABNORMAL HIGH (ref 65–99)

## 2017-08-20 MED ORDER — METHOHEXITAL SODIUM 0.5 G IJ SOLR
INTRAMUSCULAR | Status: AC
  Filled 2017-08-20: qty 500

## 2017-08-20 MED ORDER — SUCCINYLCHOLINE CHLORIDE 20 MG/ML IJ SOLN
INTRAMUSCULAR | Status: AC
  Filled 2017-08-20: qty 1

## 2017-08-20 MED ORDER — KETOROLAC TROMETHAMINE 30 MG/ML IJ SOLN
30.0000 mg | Freq: Once | INTRAMUSCULAR | Status: AC
  Administered 2017-08-20: 30 mg via INTRAVENOUS

## 2017-08-20 MED ORDER — LABETALOL HCL 5 MG/ML IV SOLN
INTRAVENOUS | Status: DC | PRN
  Administered 2017-08-20 (×2): 20 mg via INTRAVENOUS

## 2017-08-20 MED ORDER — SODIUM CHLORIDE 0.9 % IV SOLN
500.0000 mL | Freq: Once | INTRAVENOUS | Status: AC
Start: 2017-08-20 — End: 2017-08-20
  Administered 2017-08-20: 500 mL via INTRAVENOUS

## 2017-08-20 MED ORDER — GLYCOPYRROLATE 0.2 MG/ML IJ SOLN
INTRAMUSCULAR | Status: AC
  Administered 2017-08-20: 0.4 mg via INTRAVENOUS
  Filled 2017-08-20: qty 2

## 2017-08-20 MED ORDER — LABETALOL HCL 5 MG/ML IV SOLN
INTRAVENOUS | Status: AC
  Filled 2017-08-20: qty 4

## 2017-08-20 MED ORDER — ESMOLOL HCL 100 MG/10ML IV SOLN
INTRAVENOUS | Status: DC | PRN
  Administered 2017-08-20 (×2): 20 mg via INTRAVENOUS

## 2017-08-20 MED ORDER — GLYCOPYRROLATE 0.2 MG/ML IJ SOLN
0.4000 mg | Freq: Once | INTRAMUSCULAR | Status: AC
  Administered 2017-08-20: 0.4 mg via INTRAVENOUS

## 2017-08-20 MED ORDER — ESMOLOL HCL 100 MG/10ML IV SOLN
INTRAVENOUS | Status: AC
  Filled 2017-08-20: qty 10

## 2017-08-20 MED ORDER — KETOROLAC TROMETHAMINE 30 MG/ML IJ SOLN
INTRAMUSCULAR | Status: AC
  Administered 2017-08-20: 30 mg via INTRAVENOUS
  Filled 2017-08-20: qty 1

## 2017-08-20 MED ORDER — SODIUM CHLORIDE 0.9 % IV SOLN
INTRAVENOUS | Status: DC | PRN
  Administered 2017-08-20: 09:00:00 via INTRAVENOUS

## 2017-08-20 MED ORDER — METHOHEXITAL SODIUM 100 MG/10ML IV SOSY
PREFILLED_SYRINGE | INTRAVENOUS | Status: DC | PRN
  Administered 2017-08-20 (×2): 70 mg via INTRAVENOUS

## 2017-08-20 NOTE — Anesthesia Preprocedure Evaluation (Signed)
Anesthesia Evaluation  Patient identified by MRN, date of birth, ID band Patient awake    Reviewed: Allergy & Precautions, H&P , NPO status , Patient's Chart, lab work & pertinent test results  History of Anesthesia Complications Negative for: history of anesthetic complications  Airway Mallampati: II  TM Distance: >3 FB Neck ROM: full    Dental  (+) Poor Dentition, Chipped   Pulmonary sleep apnea , neg COPD,    Pulmonary exam normal breath sounds clear to auscultation       Cardiovascular hypertension, Pt. on medications (-) CAD and (-) Past MI negative cardio ROS Normal cardiovascular exam Rhythm:regular Rate:Normal     Neuro/Psych PSYCHIATRIC DISORDERS Depression Bipolar Disorder  Neuromuscular disease negative neurological ROS     GI/Hepatic negative GI ROS, Neg liver ROS, GERD  Medicated,  Endo/Other  diabetes, Type 2, Oral Hypoglycemic Agents  Renal/GU negative Renal ROS  negative genitourinary   Musculoskeletal   Abdominal (+) + obese,   Peds  Hematology negative hematology ROS (+)   Anesthesia Other Findings Past Medical History: No date: Depression No date: Diabetes mellitus without complication ([facility]) 03/07/15: Diabetic peripheral neuropathy ([facility]) 03/07/15: Diabetic peripheral neuropathy ([facility]) 03/07/15: Diabetic peripheral neuropathy ([facility]) No date: GERD (gastroesophageal reflux disease) 03/07/15: Hypercholesterolemia No date: Hypertension 03/07/15: Obesity 03/07/15: Personality disorder 03/07/15: Sinus tachycardia ([facility])     Comment: history of 03/07/15: Suicidal thoughts Past Surgical History: 03/07/15: electroconvulsion therapy BMI    Body Mass Index:  36.44 kg/m     Reproductive/Obstetrics                             Anesthesia Physical  Anesthesia Plan  ASA: III  Anesthesia Plan: General   Post-op Pain Management:    Induction: Intravenous  PONV Risk Score and  Plan: 2 and Ondansetron  Airway Management Planned: Mask  Additional Equipment:   Intra-op Plan:   Post-operative Plan:   Informed Consent: I have reviewed the patients History and Physical, chart, labs and discussed the procedure including the risks, benefits and alternatives for the proposed anesthesia with the patient or authorized representative who has indicated his/her understanding and acceptance.     Dental Advisory Given  Plan Discussed with: CRNA and Anesthesiologist  Anesthesia Plan Comments:         Anesthesia Quick Evaluation  

## 2017-08-20 NOTE — Anesthesia Post-op Follow-up Note (Signed)
Anesthesia QCDR form completed.        

## 2017-08-20 NOTE — Procedures (Signed)
ECT SERVICES Physician's Interval Evaluation & Treatment Note  Patient Identification: Melinda [name] MRN:  [id number] Date of Evaluation:  08/20/2017 TX #: [id number]  MADRS:   MMSE:   P.E. Findings:  No change to physical exam. Heart and lungs clear. Vitals unremarkable.  Psychiatric Interval Note:  Mood his baseline stable not having any suicidal thoughts or dangerous behavior  Subjective:  Patient is a 46 y.o. female seen for evaluation for Electroconvulsive Therapy. No new complaints  Treatment Summary:   []   Right Unilateral             [x]  Bilateral   % Energy : 1.0 ms 35%   Impedance: 1560 ohms  Seizure Energy Index: 2915 V squared  Postictal Suppression Index: 71%  Seizure Concordance Index: 82%  Medications  Pre Shock:  Robinul 0.4 mg Toradol 30 mg labetalol 20 mg esmolol 10 mg Brevital 70 mg succinylcholine 100 mg  Post Shock:    Seizure Duration: 29 seconds by EMG 49 seconds by EEG   Comments: Follow-up in 2 weeks   Lungs:  [x]   Clear to auscultation               []  Other:   Heart:    [x]   Regular rhythm             []  irregular rhythm    [x]   Previous H&P reviewed, patient examined and there are NO CHANGES                 []   Previous H&P reviewed, patient examined and there are changes noted.   [name], MD 10/10/201810:12 AM

## 2017-08-20 NOTE — Discharge Instructions (Signed)
1)  The drugs that you have been given will stay in your system until tomorrow so for the       next 24 hours you should not:  A. Drive an automobile  B. Make any legal decisions  C. Drink any alcoholic beverages  2)  You may resume your regular meals upon return home.  3)  A responsible adult must take you home.  Someone should stay with you for a few          hours, then be available by phone for the remainder of the treatment day.  4)  You May experience any of the following symptoms:  Headache, Nausea and a dry mouth (due to the medications you were given),  temporary memory loss and some confusion, or sore muscles (a warm bath  should help this).  If you you experience any of these symptoms let us know on                your return visit.  5)  Report any of the following: any acute discomfort, severe headache, or temperature        greater than 100.5 F.   Also report any unusual redness, swelling, drainage, or pain         at your IV site.    You may report Symptoms to:  [place] at [facility]          Phone: [phone], ECT Department           or Dr. Prescott Gum office [phone]  6)  Your next ECT Treatment is  Friday, October 26   We will call 2 days prior to your scheduled appointment for arrival times.  7)  Nothing to eat or drink after midnight the night before your procedure.  8)  Take .   With a sip of water the morning of your procedure.  9)  Other Instructions: Call [phone] to cancel the morning of your procedure due         to illness or emergency.  10) We will call within 72 hours to assess how you are feeling.

## 2017-08-20 NOTE — Anesthesia Postprocedure Evaluation (Signed)
Anesthesia Post Note  Patient: Melinda [name]  Procedure(s) Performed: ECT TX  Patient location during evaluation: PACU Anesthesia Type: General Level of consciousness: awake and alert Pain management: pain level controlled Vital Signs Assessment: post-procedure vital signs reviewed and stable Respiratory status: spontaneous breathing and respiratory function stable Cardiovascular status: stable Anesthetic complications: no     Last Vitals:  Vitals:   08/20/17 0824 08/20/17 1037  BP: (!) 144/93 136/82  Pulse: 92 87  Resp: 18 18  Temp: (!) 36.3 C 36.6 C  SpO2: 97% (!) 89%    Last Pain:  Vitals:   08/20/17 0824  TempSrc: Oral                 [name]

## 2017-08-20 NOTE — Transfer of Care (Signed)
Immediate Anesthesia Transfer of Care Note  Patient: Melinda [name]  Procedure(s) Performed: ECT TX  Patient Location: PACU  Anesthesia Type:General  Level of Consciousness: sedated  Airway & Oxygen Therapy: Patient Spontanous Breathing  Post-op Assessment: Report given to RN and Post -op Vital signs reviewed and stable  Post vital signs: Reviewed and stable  Last Vitals:  Vitals:   08/20/17 0824 08/20/17 1037  BP: (!) 144/93 136/82  Pulse: 92 87  Resp: 18 18  Temp: (!) 36.3 C 36.6 C  SpO2: 97% (!) 89%    Last Pain:  Vitals:   08/20/17 1037  TempSrc:   PainSc: Asleep         Complications: No apparent anesthesia complications

## 2017-08-20 NOTE — H&P (Signed)
Melinda [name] is an 46 y.o. female.   Chief Complaint: new complaint. Chronic mild depression and anxiety. Not worse than usual. No suicidal behavior HPI: history of recurrent severe depression controlledwith ECT and medicine management  Past Medical History:  Diagnosis Date  . Depression   . Diabetes mellitus without complication ([place])   . Diabetic peripheral neuropathy ([place]) 03/07/15  . Diabetic peripheral neuropathy ([place]) 03/07/15  . Diabetic peripheral neuropathy ([place]) 03/07/15  . GERD (gastroesophageal reflux disease)   . Hypercholesterolemia 03/07/15  . Hypertension   . Obesity 03/07/15  . Personality disorder ([place]) 03/07/15  . Sinus tachycardia 03/07/15   history of  . Suicidal thoughts 03/07/15    Past Surgical History:  Procedure Laterality Date  . electroconvulsion therapy  03/07/15    Family History  Problem Relation Age of Onset  . Hypertension Father   . Diabetes Mother    Social History:  reports that she has never smoked. She has never used smokeless tobacco. She reports that she does not drink alcohol or use drugs.  Allergies:  Allergies  Allergen Reactions  . Prednisone     Increases blood sugar     (Not in a hospital admission)  Results for orders placed or performed during the hospital encounter of 08/20/17 (from the past 48 hour(s))  Pregnancy, urine POC     Status: None   Collection Time: 08/20/17  8:28 AM  Result Value Ref Range   Preg Test, Ur NEGATIVE NEGATIVE    Comment:        THE SENSITIVITY OF THIS METHODOLOGY IS >24 mIU/mL   Glucose, capillary     Status: Abnormal   Collection Time: 08/20/17  8:34 AM  Result Value Ref Range   Glucose-Capillary 112 (H) 65 - 99 mg/dL   Comment 1 Notify RN    No results found.  Review of Systems  Constitutional: Negative.   HENT: Negative.   Eyes: Negative.   Respiratory: Negative.   Cardiovascular: Negative.   Gastrointestinal: Negative.   Musculoskeletal: Negative.   Skin: Negative.    Neurological: Negative.   Psychiatric/Behavioral: Negative.     Blood pressure (!) 144/93, pulse 92, temperature (!) 97.4 F (36.3 C), temperature source Oral, resp. rate 18, height 5\' 5"  (1.651 m), weight 110.2 kg (243 lb), last menstrual period 07/17/2017, SpO2 97 %. Physical Exam  Nursing note and vitals reviewed. Constitutional: She appears well-developed and well-nourished.  HENT:  Head: Normocephalic and atraumatic.  Eyes: Pupils are equal, round, and reactive to light. Conjunctivae are normal.  Neck: Normal range of motion.  Cardiovascular: Regular rhythm and normal heart sounds.   Respiratory: Effort normal. No respiratory distress.  GI: Soft.  Musculoskeletal: Normal range of motion.  Neurological: She is alert.  Skin: Skin is warm and dry.  Psychiatric: She has a normal mood and affect. Her behavior is normal. Judgment and thought content normal.     Assessment/Plan Follow-up in 2 weeks  [name], MD 08/20/2017, 10:11 AM

## 2017-08-20 NOTE — Anesthesia Procedure Notes (Signed)
Date/Time: 08/20/2017 10:28 AM Performed by: Dionne Bucy Pre-anesthesia Checklist: Patient identified, Emergency Drugs available, Suction available and Patient being monitored Patient Re-evaluated:Patient Re-evaluated prior to induction Oxygen Delivery Method: Circle system utilized Preoxygenation: Pre-oxygenation with 100% oxygen Induction Type: IV induction Ventilation: Mask ventilation without difficulty and Mask ventilation throughout procedure Airway Equipment and Method: Bite block Placement Confirmation: positive ETCO2 Dental Injury: Teeth and Oropharynx as per pre-operative assessment

## 2017-09-03 ENCOUNTER — Telehealth: Payer: Self-pay

## 2017-09-05 ENCOUNTER — Encounter (HOSPITAL_BASED_OUTPATIENT_CLINIC_OR_DEPARTMENT_OTHER)
Admission: RE | Admit: 2017-09-05 | Discharge: 2017-09-05 | Disposition: A | Discharge status: Home / Self Care | Payer: Medicare PPO | Source: Ambulatory Visit | Attending: Psychiatry | Admitting: Psychiatry

## 2017-09-05 ENCOUNTER — Encounter: Payer: Self-pay | Admitting: Anesthesiology

## 2017-09-05 DIAGNOSIS — K219 Gastro-esophageal reflux disease without esophagitis: Secondary | ICD-10-CM | POA: Diagnosis not present

## 2017-09-05 DIAGNOSIS — E7800 Pure hypercholesterolemia, unspecified: Secondary | ICD-10-CM | POA: Diagnosis not present

## 2017-09-05 DIAGNOSIS — I10 Essential (primary) hypertension: Secondary | ICD-10-CM | POA: Diagnosis not present

## 2017-09-05 DIAGNOSIS — Z3202 Encounter for pregnancy test, result negative: Secondary | ICD-10-CM | POA: Diagnosis not present

## 2017-09-05 DIAGNOSIS — F332 Major depressive disorder, recurrent severe without psychotic features: Secondary | ICD-10-CM

## 2017-09-05 DIAGNOSIS — E119 Type 2 diabetes mellitus without complications: Secondary | ICD-10-CM | POA: Diagnosis not present

## 2017-09-05 DIAGNOSIS — F329 Major depressive disorder, single episode, unspecified: Secondary | ICD-10-CM | POA: Diagnosis not present

## 2017-09-05 DIAGNOSIS — F339 Major depressive disorder, recurrent, unspecified: Secondary | ICD-10-CM | POA: Diagnosis not present

## 2017-09-05 DIAGNOSIS — F319 Bipolar disorder, unspecified: Secondary | ICD-10-CM | POA: Diagnosis not present

## 2017-09-05 DIAGNOSIS — E669 Obesity, unspecified: Secondary | ICD-10-CM | POA: Diagnosis not present

## 2017-09-05 DIAGNOSIS — F419 Anxiety disorder, unspecified: Secondary | ICD-10-CM | POA: Diagnosis not present

## 2017-09-05 DIAGNOSIS — E1142 Type 2 diabetes mellitus with diabetic polyneuropathy: Secondary | ICD-10-CM | POA: Diagnosis not present

## 2017-09-05 DIAGNOSIS — G4730 Sleep apnea, unspecified: Secondary | ICD-10-CM | POA: Diagnosis not present

## 2017-09-05 DIAGNOSIS — Z885 Allergy status to narcotic agent status: Secondary | ICD-10-CM | POA: Diagnosis not present

## 2017-09-05 LAB — GLUCOSE, CAPILLARY: Glucose-Capillary: 107 mg/dL — ABNORMAL HIGH (ref 65–99)

## 2017-09-05 LAB — POCT PREGNANCY, URINE: Preg Test, Ur: NEGATIVE

## 2017-09-05 MED ORDER — ONDANSETRON HCL 4 MG/2ML IJ SOLN: 4.0000 mg | Freq: Once | INTRAMUSCULAR | Status: DC | PRN

## 2017-09-05 MED ORDER — SODIUM CHLORIDE 0.9 % IV SOLN
500.0000 mL | Freq: Once | INTRAVENOUS | Status: AC
  Administered 2017-09-05: 09:00:00 via INTRAVENOUS

## 2017-09-05 MED ORDER — SUCCINYLCHOLINE CHLORIDE 20 MG/ML IJ SOLN
INTRAMUSCULAR | Status: AC
  Filled 2017-09-05: qty 1

## 2017-09-05 MED ORDER — ESMOLOL HCL 100 MG/10ML IV SOLN
INTRAVENOUS | Status: DC | PRN
  Administered 2017-09-05: 20 mg via INTRAVENOUS

## 2017-09-05 MED ORDER — FENTANYL CITRATE (PF) 100 MCG/2ML IJ SOLN: 25.0000 ug | INTRAMUSCULAR | Status: DC | PRN

## 2017-09-05 MED ORDER — ONDANSETRON HCL 4 MG/2ML IJ SOLN
INTRAMUSCULAR | Status: AC
  Administered 2017-09-05: 4 mg
  Filled 2017-09-05: qty 2

## 2017-09-05 MED ORDER — KETOROLAC TROMETHAMINE 30 MG/ML IJ SOLN
INTRAMUSCULAR | Status: AC
  Administered 2017-09-05: 30 mg via INTRAVENOUS
  Filled 2017-09-05: qty 1

## 2017-09-05 MED ORDER — KETOROLAC TROMETHAMINE 30 MG/ML IJ SOLN
30.0000 mg | Freq: Once | INTRAMUSCULAR | Status: AC
  Administered 2017-09-05: 30 mg via INTRAVENOUS

## 2017-09-05 MED ORDER — ESMOLOL HCL 100 MG/10ML IV SOLN
INTRAVENOUS | Status: AC
  Filled 2017-09-05: qty 10

## 2017-09-05 MED ORDER — LABETALOL HCL 5 MG/ML IV SOLN
INTRAVENOUS | Status: DC | PRN
  Administered 2017-09-05: 20 mg via INTRAVENOUS

## 2017-09-05 MED ORDER — GLYCOPYRROLATE 0.2 MG/ML IJ SOLN
0.4000 mg | Freq: Once | INTRAMUSCULAR | Status: AC
  Administered 2017-09-05: 0.4 mg via INTRAVENOUS

## 2017-09-05 MED ORDER — GLYCOPYRROLATE 0.2 MG/ML IJ SOLN
INTRAMUSCULAR | Status: AC
  Administered 2017-09-05: 0.4 mg via INTRAVENOUS
  Filled 2017-09-05: qty 2

## 2017-09-05 MED ORDER — METHOHEXITAL SODIUM 0.5 G IJ SOLR
INTRAMUSCULAR | Status: AC
  Filled 2017-09-05: qty 500

## 2017-09-05 MED ORDER — METHOHEXITAL SODIUM 100 MG/10ML IV SOSY
PREFILLED_SYRINGE | INTRAVENOUS | Status: DC | PRN
  Administered 2017-09-05: 70 mg via INTRAVENOUS

## 2017-09-05 MED ORDER — SUCCINYLCHOLINE CHLORIDE 200 MG/10ML IV SOSY
PREFILLED_SYRINGE | INTRAVENOUS | Status: DC | PRN
  Administered 2017-09-05: 100 mg via INTRAVENOUS

## 2017-09-05 MED ORDER — LABETALOL HCL 5 MG/ML IV SOLN
INTRAVENOUS | Status: AC
  Filled 2017-09-05: qty 4

## 2017-09-05 NOTE — Discharge Instructions (Signed)
1)  The drugs that you have been given will stay in your system until tomorrow so for the       next 24 hours you should not:  A. Drive an automobile  B. Make any legal decisions  C. Drink any alcoholic beverages  2)  You may resume your regular meals upon return home.  3)  A responsible adult must take you home.  Someone should stay with you for a few          hours, then be available by phone for the remainder of the treatment day.  4)  You May experience any of the following symptoms:  Headache, Nausea and a dry mouth (due to the medications you were given),  temporary memory loss and some confusion, or sore muscles (a warm bath  should help this).  If you you experience any of these symptoms let us know on                your return visit.  5)  Report any of the following: any acute discomfort, severe headache, or temperature        greater than 100.5 F.   Also report any unusual redness, swelling, drainage, or pain         at your IV site.    You may report Symptoms to:  [place] at [facility]          Phone: [phone], ECT Department           or Dr. Prescott Gum office [phone]  6)  Your next ECT Treatment is Friday November 9  At 8:00  We will call 2 days prior to your scheduled appointment for arrival times.  7)  Nothing to eat or drink after midnight the night before your procedure.  8)  Take      With a sip of water the morning of your procedure.  9)  Other Instructions: Call [phone] to cancel the morning of your procedure due         to illness or emergency.  10) We will call within 72 hours to assess how you are feeling.

## 2017-09-05 NOTE — Anesthesia Procedure Notes (Signed)
Date/Time: 09/05/2017 10:36 AM Performed by: Dionne Bucy Pre-anesthesia Checklist: Patient identified, Emergency Drugs available, Suction available and Patient being monitored Patient Re-evaluated:Patient Re-evaluated prior to induction Oxygen Delivery Method: Circle system utilized Preoxygenation: Pre-oxygenation with 100% oxygen Induction Type: IV induction Ventilation: Mask ventilation without difficulty and Mask ventilation throughout procedure Airway Equipment and Method: Bite block Placement Confirmation: positive ETCO2 Dental Injury: Teeth and Oropharynx as per pre-operative assessment

## 2017-09-05 NOTE — Anesthesia Post-op Follow-up Note (Signed)
Anesthesia QCDR form completed.        

## 2017-09-05 NOTE — Anesthesia Preprocedure Evaluation (Signed)
Anesthesia Evaluation  Patient identified by MRN, date of birth, ID band Patient awake    Reviewed: Allergy & Precautions, H&P , NPO status , Patient's Chart, lab work & pertinent test results  History of Anesthesia Complications Negative for: history of anesthetic complications  Airway Mallampati: II  TM Distance: >3 FB Neck ROM: full    Dental  (+) Poor Dentition, Chipped   Pulmonary sleep apnea , neg COPD,    Pulmonary exam normal breath sounds clear to auscultation       Cardiovascular hypertension, Pt. on medications (-) CAD and (-) Past MI negative cardio ROS Normal cardiovascular exam Rhythm:regular Rate:Normal     Neuro/Psych PSYCHIATRIC DISORDERS Depression Bipolar Disorder  Neuromuscular disease negative neurological ROS     GI/Hepatic negative GI ROS, Neg liver ROS, GERD  Medicated,  Endo/Other  diabetes, Type 2, Oral Hypoglycemic Agents  Renal/GU negative Renal ROS  negative genitourinary   Musculoskeletal   Abdominal (+) + obese,   Peds  Hematology negative hematology ROS (+)   Anesthesia Other Findings Past Medical History: No date: Depression No date: Diabetes mellitus without complication ([facility]) 03/07/15: Diabetic peripheral neuropathy ([facility]) 03/07/15: Diabetic peripheral neuropathy ([facility]) 03/07/15: Diabetic peripheral neuropathy ([facility]) No date: GERD (gastroesophageal reflux disease) 03/07/15: Hypercholesterolemia No date: Hypertension 03/07/15: Obesity 03/07/15: Personality disorder 03/07/15: Sinus tachycardia ([facility])     Comment: history of 03/07/15: Suicidal thoughts Past Surgical History: 03/07/15: electroconvulsion therapy BMI    Body Mass Index:  36.44 kg/m     Reproductive/Obstetrics                             Anesthesia Physical  Anesthesia Plan  ASA: III  Anesthesia Plan: General   Post-op Pain Management:    Induction: Intravenous  PONV Risk Score and  Plan: 2 and Ondansetron  Airway Management Planned: Mask  Additional Equipment:   Intra-op Plan:   Post-operative Plan:   Informed Consent: I have reviewed the patients History and Physical, chart, labs and discussed the procedure including the risks, benefits and alternatives for the proposed anesthesia with the patient or authorized representative who has indicated his/her understanding and acceptance.     Dental Advisory Given  Plan Discussed with: CRNA and Anesthesiologist  Anesthesia Plan Comments:         Anesthesia Quick Evaluation  

## 2017-09-05 NOTE — Procedures (Signed)
ECT SERVICES Physician's Interval Evaluation & Treatment Note  Patient Identification: Melinda [name] MRN:  [id number] Date of Evaluation:  09/05/2017 TX #: [id number]  MADRS:   MMSE:   P.E. Findings:  No change to physical exam  Psychiatric Interval Note:  She is feeling a little bit more down and nervous.  Talking more about enjoying pain.  Subjective:  Patient is a 46 y.o. female seen for evaluation for Electroconvulsive Therapy. A little bit more down not actively suicidal  Treatment Summary:   []   Right Unilateral             [x]  Bilateral   % Energy : 1.0 ms 35%   Impedance: 1140 ohms  Seizure Energy Index: 13,835 V squared  Postictal Suppression Index: 59%  Seizure Concordance Index: 98%  Medications  Pre Shock: Robinul 0.4 mg labetalol 20 mg esmolol 20 mg Toradol 30 mg Brevital 70 mg succinylcholine 100 mg  Post Shock:    Seizure Duration: 51 seconds by EMG 88 seconds by EEG   Comments: Follow-up 2 weeks  Lungs:  [x]   Clear to auscultation               []  Other:   Heart:    [x]   Regular rhythm             []  irregular rhythm    [x]   Previous H&P reviewed, patient examined and there are NO CHANGES                 []   Previous H&P reviewed, patient examined and there are changes noted.   [name], MD 10/26/201810:30 AM

## 2017-09-05 NOTE — H&P (Signed)
Melinda [name] is an 46 y.o. female.   Chief Complaint: Patient has been feeling a little more down and has been having a little bit of cutting. HPI: Chronic recurrent severe depression held at [place] partially by ECT  Past Medical History:  Diagnosis Date  . Depression   . Diabetes mellitus without complication ([place])   . Diabetic peripheral neuropathy ([place]) 03/07/15  . Diabetic peripheral neuropathy ([place]) 03/07/15  . Diabetic peripheral neuropathy ([place]) 03/07/15  . GERD (gastroesophageal reflux disease)   . Hypercholesterolemia 03/07/15  . Hypertension   . Obesity 03/07/15  . Personality disorder ([place]) 03/07/15  . Sinus tachycardia 03/07/15   history of  . Suicidal thoughts 03/07/15    Past Surgical History:  Procedure Laterality Date  . electroconvulsion therapy  03/07/15    Family History  Problem Relation Age of Onset  . Hypertension Father   . Diabetes Mother    Social History:  reports that she has never smoked. She has never used smokeless tobacco. She reports that she does not drink alcohol or use drugs.  Allergies:  Allergies  Allergen Reactions  . Prednisone     Increases blood sugar     (Not in a hospital admission)  Results for orders placed or performed during the hospital encounter of 09/05/17 (from the past 48 hour(s))  Pregnancy, urine POC     Status: None   Collection Time: 09/05/17  8:27 AM  Result Value Ref Range   Preg Test, Ur NEGATIVE NEGATIVE    Comment:        THE SENSITIVITY OF THIS METHODOLOGY IS >24 mIU/mL   Glucose, capillary     Status: Abnormal   Collection Time: 09/05/17  8:38 AM  Result Value Ref Range   Glucose-Capillary 107 (H) 65 - 99 mg/dL   No results found.  Review of Systems  Constitutional: Negative.   HENT: Negative.   Eyes: Negative.   Respiratory: Negative.   Cardiovascular: Negative.   Gastrointestinal: Negative.   Musculoskeletal: Negative.   Skin: Negative.   Neurological: Negative.   Psychiatric/Behavioral:  Positive for depression. Negative for hallucinations, memory loss, substance abuse and suicidal ideas. The patient is not nervous/anxious and does not have insomnia.     Blood pressure 135/71, pulse 82, temperature (!) 97.1 F (36.2 C), temperature source Temporal, resp. rate 18, height 5\' 5"  (1.651 m), weight 240 lb (108.9 kg), SpO2 100 %. Physical Exam  Nursing note and vitals reviewed. Constitutional: She appears well-developed and well-nourished.  HENT:  Head: Normocephalic and atraumatic.  Eyes: Pupils are equal, round, and reactive to light. Conjunctivae are normal.  Neck: Normal range of motion.  Cardiovascular: Regular rhythm and normal heart sounds.   Respiratory: Effort normal and breath sounds normal. No respiratory distress.  GI: Soft.  Musculoskeletal: Normal range of motion.  Neurological: She is alert.  Skin: Skin is warm and dry.  Psychiatric: Judgment and thought content normal. Her affect is blunt. Her speech is delayed. She is slowed. Cognition and memory are normal.     Assessment/Plan Supportive counseling review of plan.  No change to medication.  Treatment today in follow-up in 2 weeks per the usual schedule.  [name], MD 09/05/2017, 10:28 AM

## 2017-09-05 NOTE — Transfer of Care (Signed)
Immediate Anesthesia Transfer of Care Note  Patient: Melinda [name]  Procedure(s) Performed: ECT TX  Patient Location: PACU  Anesthesia Type:General  Level of Consciousness: sedated  Airway & Oxygen Therapy: Patient Spontanous Breathing  Post-op Assessment: Report given to RN and Post -op Vital signs reviewed and stable  Post vital signs: Reviewed and stable  Last Vitals:  Vitals:   09/05/17 0830 09/05/17 1046  BP: 135/71 (!) (P) 151/141  Pulse: 82   Resp: 18   Temp: (!) 36.2 C (P) 36.4 C  SpO2: 100%     Last Pain:  Vitals:   09/05/17 1046  TempSrc:   PainSc: (P) Asleep         Complications: No apparent anesthesia complications

## 2017-09-05 NOTE — Anesthesia Postprocedure Evaluation (Signed)
Anesthesia Post Note  Patient: Melinda [name]  Procedure(s) Performed: ECT TX  Patient location during evaluation: PACU Anesthesia Type: General Level of consciousness: awake and alert and oriented Pain management: pain level controlled Vital Signs Assessment: post-procedure vital signs reviewed and stable Respiratory status: spontaneous breathing Cardiovascular status: blood pressure returned to baseline Anesthetic complications: no     Last Vitals:  Vitals:   09/05/17 1046 09/05/17 1056  BP: (!) 151/141 (!) 125/58  Pulse: 98 93  Resp: 19 (!) 27  Temp: 36.4 C   SpO2: 91% 96%    Last Pain:  Vitals:   09/05/17 1046  TempSrc:   PainSc: Asleep                 Kal Chait

## 2017-09-17 ENCOUNTER — Telehealth: Payer: Self-pay | Admitting: *Deleted

## 2017-09-18 ENCOUNTER — Other Ambulatory Visit: Payer: Self-pay | Admitting: Psychiatry

## 2017-09-19 ENCOUNTER — Encounter: Payer: Self-pay | Admitting: Anesthesiology

## 2017-09-19 ENCOUNTER — Encounter
Admission: RE | Admit: 2017-09-19 | Discharge: 2017-09-19 | Disposition: A | Discharge status: Home / Self Care | Payer: Medicare PPO | Source: Ambulatory Visit | Attending: Psychiatry | Admitting: Psychiatry

## 2017-09-19 DIAGNOSIS — E669 Obesity, unspecified: Secondary | ICD-10-CM | POA: Insufficient documentation

## 2017-09-19 DIAGNOSIS — E1142 Type 2 diabetes mellitus with diabetic polyneuropathy: Secondary | ICD-10-CM | POA: Diagnosis not present

## 2017-09-19 DIAGNOSIS — E7800 Pure hypercholesterolemia, unspecified: Secondary | ICD-10-CM | POA: Diagnosis not present

## 2017-09-19 DIAGNOSIS — F332 Major depressive disorder, recurrent severe without psychotic features: Secondary | ICD-10-CM | POA: Diagnosis not present

## 2017-09-19 DIAGNOSIS — F609 Personality disorder, unspecified: Secondary | ICD-10-CM | POA: Insufficient documentation

## 2017-09-19 DIAGNOSIS — E119 Type 2 diabetes mellitus without complications: Secondary | ICD-10-CM | POA: Diagnosis not present

## 2017-09-19 DIAGNOSIS — Z8249 Family history of ischemic heart disease and other diseases of the circulatory system: Secondary | ICD-10-CM | POA: Insufficient documentation

## 2017-09-19 DIAGNOSIS — Z833 Family history of diabetes mellitus: Secondary | ICD-10-CM | POA: Diagnosis not present

## 2017-09-19 DIAGNOSIS — F339 Major depressive disorder, recurrent, unspecified: Secondary | ICD-10-CM | POA: Diagnosis not present

## 2017-09-19 DIAGNOSIS — Z6837 Body mass index (BMI) 37.0-37.9, adult: Secondary | ICD-10-CM | POA: Insufficient documentation

## 2017-09-19 DIAGNOSIS — I10 Essential (primary) hypertension: Secondary | ICD-10-CM | POA: Insufficient documentation

## 2017-09-19 DIAGNOSIS — K219 Gastro-esophageal reflux disease without esophagitis: Secondary | ICD-10-CM | POA: Insufficient documentation

## 2017-09-19 DIAGNOSIS — G4730 Sleep apnea, unspecified: Secondary | ICD-10-CM | POA: Diagnosis not present

## 2017-09-19 LAB — GLUCOSE, CAPILLARY: Glucose-Capillary: 103 mg/dL — ABNORMAL HIGH (ref 65–99)

## 2017-09-19 LAB — POCT PREGNANCY, URINE: Preg Test, Ur: NEGATIVE

## 2017-09-19 MED ORDER — SUCCINYLCHOLINE CHLORIDE 20 MG/ML IJ SOLN
INTRAMUSCULAR | Status: AC
  Filled 2017-09-19: qty 1

## 2017-09-19 MED ORDER — KETOROLAC TROMETHAMINE 30 MG/ML IJ SOLN
INTRAMUSCULAR | Status: AC
  Administered 2017-09-19: 30 mg via INTRAVENOUS
  Filled 2017-09-19: qty 1

## 2017-09-19 MED ORDER — GLYCOPYRROLATE 0.2 MG/ML IJ SOLN
INTRAMUSCULAR | Status: AC
  Administered 2017-09-19: 0.4 mg via INTRAVENOUS
  Filled 2017-09-19: qty 2

## 2017-09-19 MED ORDER — METHOHEXITAL SODIUM 0.5 G IJ SOLR
INTRAMUSCULAR | Status: AC
  Filled 2017-09-19: qty 500

## 2017-09-19 MED ORDER — LABETALOL HCL 5 MG/ML IV SOLN
INTRAVENOUS | Status: DC | PRN
  Administered 2017-09-19: 20 mg via INTRAVENOUS

## 2017-09-19 MED ORDER — KETOROLAC TROMETHAMINE 30 MG/ML IJ SOLN
30.0000 mg | Freq: Once | INTRAMUSCULAR | Status: AC
  Administered 2017-09-19: 30 mg via INTRAVENOUS

## 2017-09-19 MED ORDER — LABETALOL HCL 5 MG/ML IV SOLN
INTRAVENOUS | Status: AC
  Filled 2017-09-19: qty 4

## 2017-09-19 MED ORDER — METHOHEXITAL SODIUM 100 MG/10ML IV SOSY
PREFILLED_SYRINGE | INTRAVENOUS | Status: DC | PRN
  Administered 2017-09-19: 70 mg via INTRAVENOUS

## 2017-09-19 MED ORDER — ESMOLOL HCL 100 MG/10ML IV SOLN
INTRAVENOUS | Status: AC
  Filled 2017-09-19: qty 10

## 2017-09-19 MED ORDER — ESMOLOL HCL 100 MG/10ML IV SOLN
INTRAVENOUS | Status: DC | PRN
  Administered 2017-09-19: 20 mg via INTRAVENOUS

## 2017-09-19 MED ORDER — SUCCINYLCHOLINE CHLORIDE 200 MG/10ML IV SOSY
PREFILLED_SYRINGE | INTRAVENOUS | Status: DC | PRN
  Administered 2017-09-19: 100 mg via INTRAVENOUS

## 2017-09-19 MED ORDER — ONDANSETRON HCL 4 MG/2ML IJ SOLN
INTRAMUSCULAR | Status: AC
  Administered 2017-09-19: 4 mg
  Filled 2017-09-19: qty 2

## 2017-09-19 MED ORDER — GLYCOPYRROLATE 0.2 MG/ML IJ SOLN
0.4000 mg | Freq: Once | INTRAMUSCULAR | Status: AC
  Administered 2017-09-19: 0.4 mg via INTRAVENOUS

## 2017-09-19 MED ORDER — SODIUM CHLORIDE 0.9 % IV SOLN
INTRAVENOUS | Status: DC | PRN
  Administered 2017-09-19: 09:00:00 via INTRAVENOUS

## 2017-09-19 MED ORDER — SODIUM CHLORIDE 0.9 % IV SOLN
500.0000 mL | Freq: Once | INTRAVENOUS | Status: AC
  Administered 2017-09-19: 500 mL via INTRAVENOUS

## 2017-09-19 NOTE — Anesthesia Preprocedure Evaluation (Signed)
Anesthesia Evaluation  Patient identified by MRN, date of birth, ID band Patient awake    Reviewed: Allergy & Precautions, H&P , NPO status , Patient's Chart, lab work & pertinent test results  History of Anesthesia Complications Negative for: history of anesthetic complications  Airway Mallampati: II  TM Distance: >3 FB Neck ROM: full    Dental  (+) Poor Dentition, Chipped   Pulmonary sleep apnea , neg COPD,    Pulmonary exam normal breath sounds clear to auscultation       Cardiovascular hypertension, Pt. on medications (-) CAD and (-) Past MI negative cardio ROS Normal cardiovascular exam Rhythm:regular Rate:Normal     Neuro/Psych PSYCHIATRIC DISORDERS  Neuromuscular disease negative neurological ROS     GI/Hepatic negative GI ROS, Neg liver ROS, GERD  Medicated,  Endo/Other  diabetes, Type 2, Oral Hypoglycemic Agents  Renal/GU negative Renal ROS  negative genitourinary   Musculoskeletal   Abdominal (+) + obese,   Peds  Hematology negative hematology ROS (+)   Anesthesia Other Findings Past Medical History: No date: Depression No date: Diabetes mellitus without complication (HCC) 05/02/28: Diabetic peripheral neuropathy ([place]) 03/07/15: Diabetic peripheral neuropathy ([place]) 03/07/15: Diabetic peripheral neuropathy (HCC) No date: GERD (gastroesophageal reflux disease) 03/07/15: Hypercholesterolemia No date: Hypertension 03/07/15: Obesity 03/07/15: Personality disorder 03/07/15: Sinus tachycardia ([place])     Comment: history of 03/07/15: Suicidal thoughts Past Surgical History: 03/07/15: electroconvulsion therapy BMI    Body Mass Index:  36.44 kg/m     Reproductive/Obstetrics                             Anesthesia Physical  Anesthesia Plan  ASA: III  Anesthesia Plan: General   Post-op Pain Management:    Induction: Intravenous  PONV Risk Score and Plan: 2 and  Ondansetron  Airway Management Planned: Mask  Additional Equipment:   Intra-op Plan:   Post-operative Plan:   Informed Consent: I have reviewed the patients History and Physical, chart, labs and discussed the procedure including the risks, benefits and alternatives for the proposed anesthesia with the patient or authorized representative who has indicated his/her understanding and acceptance.   Dental Advisory Given  Plan Discussed with: CRNA and Anesthesiologist  Anesthesia Plan Comments:         Anesthesia Quick Evaluation

## 2017-09-19 NOTE — Anesthesia Post-op Follow-up Note (Signed)
Anesthesia QCDR form completed.        

## 2017-09-19 NOTE — Anesthesia Procedure Notes (Signed)
Date/Time: 09/19/2017 10:29 AM Performed by: Dionne Bucy, CRNA Pre-anesthesia Checklist: Patient identified, Emergency Drugs available, Suction available and Patient being monitored Patient Re-evaluated:Patient Re-evaluated prior to induction Oxygen Delivery Method: Circle system utilized Preoxygenation: Pre-oxygenation with 100% oxygen Induction Type: IV induction Ventilation: Mask ventilation without difficulty and Mask ventilation throughout procedure Airway Equipment and Method: Bite block Placement Confirmation: positive ETCO2 Dental Injury: Teeth and Oropharynx as per pre-operative assessment

## 2017-09-19 NOTE — Transfer of Care (Signed)
Immediate Anesthesia Transfer of Care Note  Patient: Melinda [name]  Procedure(s) Performed: ECT TX  Patient Location: PACU  Anesthesia Type:General  Level of Consciousness: sedated  Airway & Oxygen Therapy: Patient Spontanous Breathing and Patient connected to face mask oxygen  Post-op Assessment: Report given to RN and Post -op Vital signs reviewed and stable  Post vital signs: Reviewed and stable  Last Vitals:  Vitals:   09/19/17 0828 09/19/17 1039  BP: 130/89 (!) 153/116  Pulse: (!) 102 (!) 104  Resp: (!) 6 20  Temp: 36.9 C 37.3 C  SpO2: 100% (!) 88%    Last Pain: There were no vitals filed for this visit.       Complications: No apparent anesthesia complications

## 2017-09-19 NOTE — H&P (Signed)
Melinda [name] is an 46 y.o. female.   Chief Complaint: Mood is about stable.  Still described as depressed but no different than usual.  Also complaining of a little pain in her right shoulder. HPI: History of recurrent severe depression maintained on ECT  Past Medical History:  Diagnosis Date  . Depression   . Diabetes mellitus without complication ([place])   . Diabetic peripheral neuropathy ([place]) 03/07/15  . Diabetic peripheral neuropathy ([place]) 03/07/15  . Diabetic peripheral neuropathy ([place]) 03/07/15  . GERD (gastroesophageal reflux disease)   . Hypercholesterolemia 03/07/15  . Hypertension   . Obesity 03/07/15  . Personality disorder ([place]) 03/07/15  . Sinus tachycardia 03/07/15   history of  . Suicidal thoughts 03/07/15    Past Surgical History:  Procedure Laterality Date  . electroconvulsion therapy  03/07/15    Family History  Problem Relation Age of Onset  . Hypertension Father   . Diabetes Mother    Social History:  reports that  has never smoked. she has never used smokeless tobacco. She reports that she does not drink alcohol or use drugs.  Allergies:  Allergies  Allergen Reactions  . Prednisone     Increases blood sugar     (Not in a hospital admission)  Results for orders placed or performed during the hospital encounter of 09/19/17 (from the past 48 hour(s))  Pregnancy, urine POC     Status: None   Collection Time: 09/19/17  8:31 AM  Result Value Ref Range   Preg Test, Ur NEGATIVE NEGATIVE    Comment:        THE SENSITIVITY OF THIS METHODOLOGY IS >24 mIU/mL   Glucose, capillary     Status: Abnormal   Collection Time: 09/19/17  8:34 AM  Result Value Ref Range   Glucose-Capillary 103 (H) 65 - 99 mg/dL   No results found.  Review of Systems  Constitutional: Negative.   HENT: Negative.   Eyes: Negative.   Respiratory: Negative.   Cardiovascular: Negative.   Gastrointestinal: Negative.   Musculoskeletal: Positive for back pain.  Skin: Negative.    Neurological: Negative.   Psychiatric/Behavioral: Positive for depression. Negative for hallucinations, memory loss, substance abuse and suicidal ideas. The patient is not nervous/anxious and does not have insomnia.     Blood pressure 130/89, pulse (!) 102, temperature 98.4 F (36.9 C), resp. rate (!) 6, height 5\' 5"  (1.651 m), weight 111.6 kg (246 lb), last menstrual period 08/25/2017, SpO2 100 %. Physical Exam  Nursing note and vitals reviewed. Cardiovascular: Regular rhythm.  Respiratory: No respiratory distress.  Psychiatric: She has a normal mood and affect. Her speech is normal and behavior is normal. Judgment and thought content normal. Cognition and memory are normal.     Assessment/Plan Treatment today follow-up in 3 weeks because of the holiday schedule  [name], MD 09/19/2017, 10:23 AM

## 2017-09-19 NOTE — Procedures (Signed)
ECT SERVICES Physician's Interval Evaluation & Treatment Note  Patient Identification: Melinda [name] MRN:  [id number] Date of Evaluation:  09/19/2017 TX #: [id number]  MADRS:   MMSE:   P.E. Findings:  No change to physical exam  Psychiatric Interval Note:  Mood reported as depressed affect euthymic and appropriate  Subjective:  Patient is a 46 y.o. female seen for evaluation for Electroconvulsive Therapy. No specific depression no suicidal ideation no hallucinations  Treatment Summary:   []   Right Unilateral             [x]  Bilateral   % Energy : 1.0 ms 35%   Impedance: 1170 ohms  Seizure Energy Index: 9759 V squared  Postictal Suppression Index: 66%  Seizure Concordance Index: 98%  Medications  Pre Shock: Robinul 0.4 mg labetalol 20 mg esmolol 20 mg Toradol 30 mg Brevital 70 mg succinylcholine 100 mg  Post Shock:    Seizure Duration: 42 seconds by EMG 97 seconds by EEG   Comments: Follow-up 3 weeks because of the holiday schedule  Lungs:  [x]   Clear to auscultation               []  Other:   Heart:    [x]   Regular rhythm             []  irregular rhythm    [x]   Previous H&P reviewed, patient examined and there are NO CHANGES                 []   Previous H&P reviewed, patient examined and there are changes noted.   [name], MD 11/9/201810:25 AM

## 2017-09-19 NOTE — Anesthesia Postprocedure Evaluation (Signed)
Anesthesia Post Note  Patient: Robbi M Degante  Procedure(s) Performed: ECT TX  Patient location during evaluation: PACU Anesthesia Type: General Level of consciousness: awake and alert Pain management: pain level controlled Vital Signs Assessment: post-procedure vital signs reviewed and stable Respiratory status: spontaneous breathing, nonlabored ventilation, respiratory function stable and patient connected to nasal cannula oxygen Cardiovascular status: blood pressure returned to baseline and stable Postop Assessment: no apparent nausea or vomiting Anesthetic complications: no     Last Vitals:  Vitals:   09/19/17 1109 09/19/17 1115  BP: 133/76 102/77  Pulse: 90 86  Resp: (!) 25 16  Temp:    SpO2: 98%     Last Pain:  Vitals:   09/19/17 1109  PainSc: (P) 0-No pain                 Martha Clan

## 2017-09-19 NOTE — Discharge Instructions (Signed)
1)  The drugs that you have been given will stay in your system until tomorrow so for the       next 24 hours you should not:  A. Drive an automobile  B. Make any legal decisions  C. Drink any alcoholic beverages  2)  You may resume your regular meals upon return home.  3)  A responsible adult must take you home.  Someone should stay with you for a few          hours, then be available by phone for the remainder of the treatment day.  4)  You May experience any of the following symptoms:  Headache, Nausea and a dry mouth (due to the medications you were given),  temporary memory loss and some confusion, or sore muscles (a warm bath  should help this).  If you you experience any of these symptoms let us know on                your return visit.  5)  Report any of the following: any acute discomfort, severe headache, or temperature        greater than 100.5 F.   Also report any unusual redness, swelling, drainage, or pain         at your IV site.    You may report Symptoms to:  [place] at [facility]          Phone: [phone], ECT Department           or Dr. Prescott Gum office [phone]  6)  Your next ECT Treatment is Day Friday  Date October 10, 2017 at 8am  We will call 2 days prior to your scheduled appointment for arrival times.  7)  Nothing to eat or drink after midnight the night before your procedure.  8)  Take .     With a sip of water the morning of your procedure.  9)  Other Instructions: Call [phone] to cancel the morning of your procedure due         to illness or emergency.  10) We will call within 72 hours to assess how you are feeling.

## 2017-10-08 ENCOUNTER — Telehealth: Payer: Self-pay | Admitting: *Deleted

## 2017-10-09 ENCOUNTER — Other Ambulatory Visit: Payer: Self-pay | Admitting: Psychiatry

## 2017-10-10 ENCOUNTER — Encounter (HOSPITAL_BASED_OUTPATIENT_CLINIC_OR_DEPARTMENT_OTHER)
Admission: RE | Admit: 2017-10-10 | Discharge: 2017-10-10 | Disposition: A | Discharge status: Home / Self Care | Payer: Medicare PPO | Source: Ambulatory Visit | Attending: Psychiatry | Admitting: Psychiatry

## 2017-10-10 ENCOUNTER — Encounter: Payer: Self-pay | Admitting: Anesthesiology

## 2017-10-10 DIAGNOSIS — F332 Major depressive disorder, recurrent severe without psychotic features: Secondary | ICD-10-CM

## 2017-10-10 DIAGNOSIS — Z6837 Body mass index (BMI) 37.0-37.9, adult: Secondary | ICD-10-CM | POA: Diagnosis not present

## 2017-10-10 DIAGNOSIS — E7800 Pure hypercholesterolemia, unspecified: Secondary | ICD-10-CM | POA: Diagnosis not present

## 2017-10-10 DIAGNOSIS — E669 Obesity, unspecified: Secondary | ICD-10-CM | POA: Diagnosis not present

## 2017-10-10 DIAGNOSIS — I10 Essential (primary) hypertension: Secondary | ICD-10-CM | POA: Diagnosis not present

## 2017-10-10 DIAGNOSIS — G4730 Sleep apnea, unspecified: Secondary | ICD-10-CM | POA: Diagnosis not present

## 2017-10-10 DIAGNOSIS — K219 Gastro-esophageal reflux disease without esophagitis: Secondary | ICD-10-CM | POA: Diagnosis not present

## 2017-10-10 DIAGNOSIS — F609 Personality disorder, unspecified: Secondary | ICD-10-CM | POA: Diagnosis not present

## 2017-10-10 DIAGNOSIS — E1142 Type 2 diabetes mellitus with diabetic polyneuropathy: Secondary | ICD-10-CM | POA: Diagnosis not present

## 2017-10-10 DIAGNOSIS — Z8249 Family history of ischemic heart disease and other diseases of the circulatory system: Secondary | ICD-10-CM | POA: Diagnosis not present

## 2017-10-10 DIAGNOSIS — E119 Type 2 diabetes mellitus without complications: Secondary | ICD-10-CM | POA: Diagnosis not present

## 2017-10-10 DIAGNOSIS — F339 Major depressive disorder, recurrent, unspecified: Secondary | ICD-10-CM | POA: Diagnosis not present

## 2017-10-10 LAB — GLUCOSE, CAPILLARY: Glucose-Capillary: 106 mg/dL — ABNORMAL HIGH (ref 65–99)

## 2017-10-10 LAB — POCT PREGNANCY, URINE: Preg Test, Ur: NEGATIVE

## 2017-10-10 MED ORDER — SODIUM CHLORIDE 0.9 % IV SOLN
500.0000 mL | Freq: Once | INTRAVENOUS | Status: AC
  Administered 2017-10-10: 500 mL via INTRAVENOUS

## 2017-10-10 MED ORDER — LABETALOL HCL 5 MG/ML IV SOLN
INTRAVENOUS | Status: DC | PRN
  Administered 2017-10-10: 20 mg via INTRAVENOUS

## 2017-10-10 MED ORDER — SODIUM CHLORIDE 0.9 % IV SOLN
INTRAVENOUS | Status: DC | PRN
  Administered 2017-10-10: 09:00:00 via INTRAVENOUS

## 2017-10-10 MED ORDER — KETOROLAC TROMETHAMINE 30 MG/ML IJ SOLN
INTRAMUSCULAR | Status: AC
  Administered 2017-10-10: 30 mg via INTRAVENOUS
  Filled 2017-10-10: qty 1

## 2017-10-10 MED ORDER — METHOHEXITAL SODIUM 0.5 G IJ SOLR
INTRAMUSCULAR | Status: AC
  Filled 2017-10-10: qty 500

## 2017-10-10 MED ORDER — GLYCOPYRROLATE 0.2 MG/ML IJ SOLN
0.4000 mg | Freq: Once | INTRAMUSCULAR | Status: AC
  Administered 2017-10-10: 0.4 mg via INTRAVENOUS

## 2017-10-10 MED ORDER — ESMOLOL HCL 100 MG/10ML IV SOLN
INTRAVENOUS | Status: AC
  Filled 2017-10-10: qty 10

## 2017-10-10 MED ORDER — LABETALOL HCL 5 MG/ML IV SOLN
INTRAVENOUS | Status: AC
  Filled 2017-10-10: qty 4

## 2017-10-10 MED ORDER — GLYCOPYRROLATE 0.2 MG/ML IJ SOLN
INTRAMUSCULAR | Status: AC
  Filled 2017-10-10: qty 2

## 2017-10-10 MED ORDER — METHOHEXITAL SODIUM 100 MG/10ML IV SOSY
PREFILLED_SYRINGE | INTRAVENOUS | Status: DC | PRN
  Administered 2017-10-10: 70 mg via INTRAVENOUS

## 2017-10-10 MED ORDER — KETOROLAC TROMETHAMINE 30 MG/ML IJ SOLN
30.0000 mg | Freq: Once | INTRAMUSCULAR | Status: AC
  Administered 2017-10-10: 30 mg via INTRAVENOUS

## 2017-10-10 MED ORDER — ESMOLOL HCL 100 MG/10ML IV SOLN
INTRAVENOUS | Status: DC | PRN
  Administered 2017-10-10: 20 mg via INTRAVENOUS

## 2017-10-10 MED ORDER — ONDANSETRON HCL 4 MG/2ML IJ SOLN: 4.0000 mg | Freq: Once | INTRAMUSCULAR | Status: DC | PRN

## 2017-10-10 MED ORDER — SUCCINYLCHOLINE CHLORIDE 200 MG/10ML IV SOSY
PREFILLED_SYRINGE | INTRAVENOUS | Status: DC | PRN
  Administered 2017-10-10: 100 mg via INTRAVENOUS

## 2017-10-10 MED ORDER — SUCCINYLCHOLINE CHLORIDE 20 MG/ML IJ SOLN
INTRAMUSCULAR | Status: AC
  Filled 2017-10-10: qty 1

## 2017-10-10 NOTE — Procedures (Signed)
ECT SERVICES Physician's Interval Evaluation & Treatment Note  Patient Identification: Melinda [name] MRN:  [id number] Date of Evaluation:  10/10/2017 TX #: [id number]  MADRS:   MMSE:   P.E. Findings:  No change to physical exam  Psychiatric Interval Note:  Reporting feeling anxious and depressed not suicidal not psychotic  Subjective:  Patient is a 46 y.o. female seen for evaluation for Electroconvulsive Therapy. More anxious than usual perhaps  Treatment Summary:   []   Right Unilateral             [x]  Bilateral   % Energy : 1.0 ms 35%   Impedance: 1410 ohms  Seizure Energy Index: 11,859 V squared  Postictal Suppression Index: 77%  Seizure Concordance Index: 96%  Medications  Pre Shock: Robinul 0.4 mg Toradol 30 mg esmolol 10 mg labetalol 20 mg Brevital 70 mg succinylcholine 100 mg  Post Shock:    Seizure Duration: 41 seconds EMG 79 seconds EEG   Comments: Follow-up in 2 weeks  Lungs:  [x]   Clear to auscultation               []  Other:   Heart:    [x]   Regular rhythm             []  irregular rhythm    [x]   Previous H&P reviewed, patient examined and there are NO CHANGES                 []   Previous H&P reviewed, patient examined and there are changes noted.   [name], MD 11/30/201810:14 AM

## 2017-10-10 NOTE — Anesthesia Procedure Notes (Signed)
Date/Time: 10/10/2017 10:20 AM Performed by: Dionne Bucy, CRNA Pre-anesthesia Checklist: Patient identified, Emergency Drugs available, Suction available and Patient being monitored Patient Re-evaluated:Patient Re-evaluated prior to induction Oxygen Delivery Method: Circle system utilized Preoxygenation: Pre-oxygenation with 100% oxygen Induction Type: IV induction Ventilation: Mask ventilation without difficulty and Mask ventilation throughout procedure Airway Equipment and Method: Bite block Placement Confirmation: positive ETCO2 Dental Injury: Teeth and Oropharynx as per pre-operative assessment

## 2017-10-10 NOTE — Anesthesia Post-op Follow-up Note (Signed)
Anesthesia QCDR form completed.        

## 2017-10-10 NOTE — Discharge Instructions (Signed)
1)  The drugs that you have been given will stay in your system until tomorrow so for the       next 24 hours you should not:  A. Drive an automobile  B. Make any legal decisions  C. Drink any alcoholic beverages  2)  You may resume your regular meals upon return home.  3)  A responsible adult must take you home.  Someone should stay with you for a few          hours, then be available by phone for the remainder of the treatment day.  4)  You May experience any of the following symptoms:  Headache, Nausea and a dry mouth (due to the medications you were given),  temporary memory loss and some confusion, or sore muscles (a warm bath  should help this).  If you you experience any of these symptoms let us know on                your return visit.  5)  Report any of the following: any acute discomfort, severe headache, or temperature        greater than 100.5 F.   Also report any unusual redness, swelling, drainage, or pain         at your IV site.    You may report Symptoms to:  [place] at [facility]          Phone: [phone], ECT Department           or Dr. Prescott Gum office [phone]  6)  Your next ECT Treatment is Day Friday  Date October 24, 2017 at 8am  We will call 2 days prior to your scheduled appointment for arrival times.  7)  Nothing to eat or drink after midnight the night before your procedure.  8)  Take .   With a sip of water the morning of your procedure.  9)  Other Instructions: Call [phone] to cancel the morning of your procedure due         to illness or emergency.  10) We will call within 72 hours to assess how you are feeling.

## 2017-10-10 NOTE — H&P (Signed)
Melinda [name] is an 46 y.o. female.   Chief Complaint: Continued anxiety and intermittent feelings with thoughts about cutting HPI: Long-standing depression showing some stability and improvement with ECT maintenance  Past Medical History:  Diagnosis Date  . Depression   . Diabetes mellitus without complication ([place])   . Diabetic peripheral neuropathy ([place]) 03/07/15  . Diabetic peripheral neuropathy ([place]) 03/07/15  . Diabetic peripheral neuropathy ([place]) 03/07/15  . GERD (gastroesophageal reflux disease)   . Hypercholesterolemia 03/07/15  . Hypertension   . Obesity 03/07/15  . Personality disorder ([place]) 03/07/15  . Sinus tachycardia 03/07/15   history of  . Suicidal thoughts 03/07/15    Past Surgical History:  Procedure Laterality Date  . electroconvulsion therapy  03/07/15    Family History  Problem Relation Age of Onset  . Hypertension Father   . Diabetes Mother    Social History:  reports that  has never smoked. she has never used smokeless tobacco. She reports that she does not drink alcohol or use drugs.  Allergies:  Allergies  Allergen Reactions  . Prednisone     Increases blood sugar     (Not in a hospital admission)  Results for orders placed or performed during the hospital encounter of 10/10/17 (from the past 48 hour(s))  Glucose, capillary     Status: Abnormal   Collection Time: 10/10/17  8:20 AM  Result Value Ref Range   Glucose-Capillary 106 (H) 65 - 99 mg/dL   Comment 1 Notify RN   Pregnancy, urine POC     Status: None   Collection Time: 10/10/17  8:21 AM  Result Value Ref Range   Preg Test, Ur NEGATIVE NEGATIVE    Comment:        THE SENSITIVITY OF THIS METHODOLOGY IS >24 mIU/mL    No results found.  Review of Systems  Constitutional: Negative.   HENT: Negative.   Eyes: Negative.   Respiratory: Negative.   Cardiovascular: Negative.   Gastrointestinal: Negative.   Musculoskeletal: Negative.   Skin: Negative.   Neurological: Negative.    Psychiatric/Behavioral: Positive for depression. Negative for hallucinations, memory loss, substance abuse and suicidal ideas. The patient is nervous/anxious. The patient does not have insomnia.     Blood pressure (!) 149/59, pulse 95, temperature 98.3 F (36.8 C), temperature source Oral, resp. rate 16, height 5\' 5"  (1.651 m), weight 109.8 kg (242 lb), last menstrual period 09/14/2017, SpO2 100 %. Physical Exam  Nursing note and vitals reviewed. Constitutional: She appears well-developed and well-nourished.  HENT:  Head: Normocephalic and atraumatic.  Eyes: Conjunctivae are normal. Pupils are equal, round, and reactive to light.  Neck: Normal range of motion.  Cardiovascular: Regular rhythm and normal heart sounds.  Respiratory: Effort normal and breath sounds normal. No respiratory distress.  GI: Soft.  Musculoskeletal: Normal range of motion.  Neurological: She is alert.  Skin: Skin is warm and dry.  Psychiatric: Her speech is normal and behavior is normal. Judgment and thought content normal. Her mood appears anxious. Cognition and memory are normal.     Assessment/Plan Treatment today follow up 2 weeks.  Supportive therapy.  Encouragement and review of coping skills.  [name], MD 10/10/2017, 10:12 AM

## 2017-10-10 NOTE — Anesthesia Postprocedure Evaluation (Signed)
Anesthesia Post Note  Patient: Melinda [name]  Procedure(s) Performed: ECT TX  Patient location during evaluation: PACU Anesthesia Type: General Level of consciousness: awake and alert Pain management: pain level controlled Vital Signs Assessment: post-procedure vital signs reviewed and stable Respiratory status: spontaneous breathing and respiratory function stable Cardiovascular status: stable Anesthetic complications: no     Last Vitals:  Vitals:   10/10/17 1048 10/10/17 1057  BP: 112/78 113/73  Pulse: 90 86  Resp: (!) 22 16  Temp: 36.8 C   SpO2: 100%     Last Pain:  Vitals:   10/10/17 1057  TempSrc:   PainSc: 2                  Swathi Dauphin K

## 2017-10-10 NOTE — Anesthesia Preprocedure Evaluation (Signed)
Anesthesia Evaluation  Patient identified by MRN, date of birth, ID band Patient awake    Reviewed: Allergy & Precautions, H&P , NPO status , Patient's Chart, lab work & pertinent test results  History of Anesthesia Complications Negative for: history of anesthetic complications  Airway Mallampati: II  TM Distance: >3 FB Neck ROM: full    Dental  (+) Poor Dentition, Chipped   Pulmonary sleep apnea , neg COPD,    Pulmonary exam normal breath sounds clear to auscultation       Cardiovascular hypertension, Pt. on medications (-) CAD and (-) Past MI negative cardio ROS Normal cardiovascular exam Rhythm:regular Rate:Normal     Neuro/Psych PSYCHIATRIC DISORDERS  Neuromuscular disease negative neurological ROS     GI/Hepatic negative GI ROS, Neg liver ROS, GERD  Medicated,  Endo/Other  diabetes, Type 2, Oral Hypoglycemic Agents  Renal/GU negative Renal ROS  negative genitourinary   Musculoskeletal   Abdominal (+) + obese,   Peds  Hematology negative hematology ROS (+)   Anesthesia Other Findings Past Medical History: No date: Depression No date: Diabetes mellitus without complication (HCC) 9/89/21: Diabetic peripheral neuropathy ([place]) 03/07/15: Diabetic peripheral neuropathy ([place]) 03/07/15: Diabetic peripheral neuropathy (H[facility]) No date: GERD (gastroesophageal reflux disease) 03/07/15: Hypercholesterolemia No date: Hypertension 03/07/15: Obesity 03/07/15: Personality disorder 03/07/15: Sinus tachycardia ([place])     Comment: history of 03/07/15: Suicidal thoughts Past Surgical History: 03/07/15: electroconvulsion therapy BMI    Body Mass Index:  36.44 kg/m     Reproductive/Obstetrics                             Anesthesia Physical  Anesthesia Plan  ASA: III  Anesthesia Plan: General   Post-op Pain Management:    Induction: Intravenous  PONV Risk Score and Plan: 2 and  Ondansetron  Airway Management Planned: Mask  Additional Equipment:   Intra-op Plan:   Post-operative Plan:   Informed Consent: I have reviewed the patients History and Physical, chart, labs and discussed the procedure including the risks, benefits and alternatives for the proposed anesthesia with the patient or authorized representative who has indicated his/her understanding and acceptance.     Plan Discussed with: CRNA and Anesthesiologist  Anesthesia Plan Comments:         Anesthesia Quick Evaluation

## 2017-10-10 NOTE — Transfer of Care (Signed)
Immediate Anesthesia Transfer of Care Note  Patient: Melinda [name]  Procedure(s) Performed: ECT TX  Patient Location: PACU  Anesthesia Type:General  Level of Consciousness: sedated  Airway & Oxygen Therapy: Patient Spontanous Breathing and Patient connected to face mask oxygen  Post-op Assessment: Report given to RN and Post -op Vital signs reviewed and stable  Post vital signs: Reviewed and stable  Last Vitals:  Vitals:   10/10/17 0827  BP: (!) 149/59  Pulse: 95  Resp: 16  Temp: 36.8 C  SpO2: 100%    Last Pain:  Vitals:   10/10/17 0827  TempSrc: Oral         Complications: No apparent anesthesia complications

## 2017-10-23 ENCOUNTER — Other Ambulatory Visit: Payer: Self-pay | Admitting: Psychiatry

## 2017-10-24 ENCOUNTER — Encounter: Payer: Self-pay | Admitting: Anesthesiology

## 2017-10-24 ENCOUNTER — Encounter
Admission: RE | Admit: 2017-10-24 | Discharge: 2017-10-24 | Disposition: A | Discharge status: Home / Self Care | Payer: Medicare PPO | Source: Ambulatory Visit | Attending: Psychiatry | Admitting: Psychiatry

## 2017-10-24 DIAGNOSIS — F332 Major depressive disorder, recurrent severe without psychotic features: Secondary | ICD-10-CM | POA: Diagnosis not present

## 2017-10-24 DIAGNOSIS — E7800 Pure hypercholesterolemia, unspecified: Secondary | ICD-10-CM | POA: Diagnosis not present

## 2017-10-24 DIAGNOSIS — I10 Essential (primary) hypertension: Secondary | ICD-10-CM | POA: Insufficient documentation

## 2017-10-24 DIAGNOSIS — E1142 Type 2 diabetes mellitus with diabetic polyneuropathy: Secondary | ICD-10-CM | POA: Insufficient documentation

## 2017-10-24 DIAGNOSIS — K219 Gastro-esophageal reflux disease without esophagitis: Secondary | ICD-10-CM | POA: Insufficient documentation

## 2017-10-24 DIAGNOSIS — F329 Major depressive disorder, single episode, unspecified: Secondary | ICD-10-CM | POA: Diagnosis not present

## 2017-10-24 DIAGNOSIS — E669 Obesity, unspecified: Secondary | ICD-10-CM | POA: Diagnosis not present

## 2017-10-24 DIAGNOSIS — F339 Major depressive disorder, recurrent, unspecified: Secondary | ICD-10-CM | POA: Diagnosis not present

## 2017-10-24 DIAGNOSIS — E1140 Type 2 diabetes mellitus with diabetic neuropathy, unspecified: Secondary | ICD-10-CM | POA: Diagnosis not present

## 2017-10-24 DIAGNOSIS — G4730 Sleep apnea, unspecified: Secondary | ICD-10-CM | POA: Diagnosis not present

## 2017-10-24 DIAGNOSIS — F609 Personality disorder, unspecified: Secondary | ICD-10-CM | POA: Diagnosis not present

## 2017-10-24 LAB — GLUCOSE, CAPILLARY: Glucose-Capillary: 108 mg/dL — ABNORMAL HIGH (ref 65–99)

## 2017-10-24 LAB — POCT PREGNANCY, URINE: Preg Test, Ur: NEGATIVE

## 2017-10-24 MED ORDER — SUCCINYLCHOLINE CHLORIDE 200 MG/10ML IV SOSY
PREFILLED_SYRINGE | INTRAVENOUS | Status: DC | PRN
  Administered 2017-10-24: 100 mg via INTRAVENOUS

## 2017-10-24 MED ORDER — ONDANSETRON HCL 4 MG/2ML IJ SOLN
INTRAMUSCULAR | Status: AC
  Filled 2017-10-24: qty 2

## 2017-10-24 MED ORDER — GLYCOPYRROLATE 0.2 MG/ML IJ SOLN
INTRAMUSCULAR | Status: AC
  Administered 2017-10-24: 0.4 mg via INTRAVENOUS
  Filled 2017-10-24: qty 2

## 2017-10-24 MED ORDER — KETOROLAC TROMETHAMINE 30 MG/ML IJ SOLN
30.0000 mg | Freq: Once | INTRAMUSCULAR | Status: AC
  Administered 2017-10-24: 30 mg via INTRAVENOUS

## 2017-10-24 MED ORDER — SODIUM CHLORIDE 0.9 % IV SOLN
500.0000 mL | Freq: Once | INTRAVENOUS | Status: AC
  Administered 2017-10-24: 500 mL via INTRAVENOUS

## 2017-10-24 MED ORDER — GLYCOPYRROLATE 0.2 MG/ML IJ SOLN
0.4000 mg | Freq: Once | INTRAMUSCULAR | Status: AC
  Administered 2017-10-24: 0.4 mg via INTRAVENOUS

## 2017-10-24 MED ORDER — KETOROLAC TROMETHAMINE 30 MG/ML IJ SOLN
INTRAMUSCULAR | Status: AC
  Administered 2017-10-24: 30 mg via INTRAVENOUS
  Filled 2017-10-24: qty 1

## 2017-10-24 MED ORDER — METHOHEXITAL SODIUM 0.5 G IJ SOLR
INTRAMUSCULAR | Status: AC
  Filled 2017-10-24: qty 500

## 2017-10-24 MED ORDER — ESMOLOL HCL 100 MG/10ML IV SOLN
INTRAVENOUS | Status: DC | PRN
  Administered 2017-10-24: 20 mg via INTRAVENOUS

## 2017-10-24 MED ORDER — METHOHEXITAL SODIUM 100 MG/10ML IV SOSY
PREFILLED_SYRINGE | INTRAVENOUS | Status: DC | PRN
  Administered 2017-10-24: 70 mg via INTRAVENOUS

## 2017-10-24 MED ORDER — LABETALOL HCL 5 MG/ML IV SOLN
INTRAVENOUS | Status: DC | PRN
  Administered 2017-10-24: 20 mg via INTRAVENOUS

## 2017-10-24 MED ORDER — SODIUM CHLORIDE 0.9 % IV SOLN
INTRAVENOUS | Status: DC | PRN
  Administered 2017-10-24: 09:00:00 via INTRAVENOUS

## 2017-10-24 NOTE — Anesthesia Postprocedure Evaluation (Signed)
Anesthesia Post Note  Patient: Melinda [name]  Procedure(s) Performed: ECT TX  Patient location during evaluation: PACU Anesthesia Type: General Level of consciousness: awake and alert Pain management: pain level controlled Vital Signs Assessment: post-procedure vital signs reviewed and stable Respiratory status: spontaneous breathing, nonlabored ventilation, respiratory function stable and patient connected to nasal cannula oxygen Cardiovascular status: blood pressure returned to baseline and stable Postop Assessment: no apparent nausea or vomiting Anesthetic complications: no     Last Vitals:  Vitals:   10/24/17 1116 10/24/17 1121  BP:  129/74  Pulse: 82   Resp: 20   Temp: 36.8 C   SpO2:      Last Pain:  Vitals:   10/24/17 1116  TempSrc: Oral  PainSc:                  Molli Barrows

## 2017-10-24 NOTE — Transfer of Care (Signed)
Immediate Anesthesia Transfer of Care Note  Patient: Melinda [name]  Procedure(s) Performed: ECT TX  Patient Location: PACU  Anesthesia Type:General  Level of Consciousness: sedated  Airway & Oxygen Therapy: Patient Spontanous Breathing  Post-op Assessment: Report given to RN and Post -op Vital signs reviewed and stable  Post vital signs: Reviewed and stable  Last Vitals:  Vitals:   10/24/17 0847 10/24/17 1043  BP: 139/87   Pulse: 90 96  Resp:  15  Temp:  37 C  SpO2:  94%    Last Pain:  Vitals:   10/24/17 1043  TempSrc:   PainSc: 0-No pain         Complications: No apparent anesthesia complications

## 2017-10-24 NOTE — H&P (Signed)
Melinda [name] is an 46 y.o. female.   Chief Complaint: Chronic depression no specific new complaint HPI: History of chronic depression stable  Past Medical History:  Diagnosis Date  . Depression   . Diabetes mellitus without complication ([place])   . Diabetic peripheral neuropathy ([place]) 03/07/15  . Diabetic peripheral neuropathy ([place]) 03/07/15  . Diabetic peripheral neuropathy ([place]) 03/07/15  . GERD (gastroesophageal reflux disease)   . Hypercholesterolemia 03/07/15  . Hypertension   . Obesity 03/07/15  . Personality disorder ([place]) 03/07/15  . Sinus tachycardia 03/07/15   history of  . Suicidal thoughts 03/07/15    Past Surgical History:  Procedure Laterality Date  . electroconvulsion therapy  03/07/15    Family History  Problem Relation Age of Onset  . Hypertension Father   . Diabetes Mother    Social History:  reports that  has never smoked. she has never used smokeless tobacco. She reports that she does not drink alcohol or use drugs.  Allergies:  Allergies  Allergen Reactions  . Prednisone     Increases blood sugar     (Not in a hospital admission)  Results for orders placed or performed during the hospital encounter of 10/24/17 (from the past 48 hour(s))  Pregnancy, urine POC     Status: None   Collection Time: 10/24/17  8:33 AM  Result Value Ref Range   Preg Test, Ur NEGATIVE NEGATIVE    Comment:        THE SENSITIVITY OF THIS METHODOLOGY IS >24 mIU/mL   Glucose, capillary     Status: Abnormal   Collection Time: 10/24/17  8:40 AM  Result Value Ref Range   Glucose-Capillary 108 (H) 65 - 99 mg/dL   No results found.  Review of Systems  Constitutional: Negative.   HENT: Negative.   Eyes: Negative.   Respiratory: Negative.   Cardiovascular: Negative.   Gastrointestinal: Negative.   Musculoskeletal: Negative.   Skin: Negative.   Neurological: Negative.   Psychiatric/Behavioral: Negative.     Blood pressure 139/87, pulse 90, temperature 98.1 F (36.7  C), temperature source Oral, resp. rate 16, height 5\' 3"  (1.6 m), weight 110.2 kg (243 lb), SpO2 100 %. Physical Exam  Vitals reviewed. Constitutional: She appears well-developed and well-nourished.  HENT:  Head: Normocephalic and atraumatic.  Eyes: Conjunctivae are normal. Pupils are equal, round, and reactive to light.  Neck: Normal range of motion.  Cardiovascular: Regular rhythm and normal heart sounds.  Respiratory: Effort normal. No respiratory distress.  GI: Soft.  Musculoskeletal: Normal range of motion.  Neurological: She is alert.  Skin: Skin is warm and dry.  Psychiatric: She has a normal mood and affect. Her behavior is normal. Judgment and thought content normal.     Assessment/Plan Treatment today follow-up 3 weeks for further maintenance ECT  [name], MD 10/24/2017, 10:28 AM

## 2017-10-24 NOTE — Anesthesia Procedure Notes (Signed)
Date/Time: 10/24/2017 10:35 AM Performed by: Dionne Bucy, CRNA Pre-anesthesia Checklist: Patient identified, Emergency Drugs available, Suction available and Patient being monitored Patient Re-evaluated:Patient Re-evaluated prior to induction Oxygen Delivery Method: Circle system utilized Preoxygenation: Pre-oxygenation with 100% oxygen Induction Type: IV induction Ventilation: Mask ventilation without difficulty and Mask ventilation throughout procedure Airway Equipment and Method: Bite block Placement Confirmation: positive ETCO2 Dental Injury: Teeth and Oropharynx as per pre-operative assessment

## 2017-10-24 NOTE — Procedures (Signed)
ECT SERVICES Physician's Interval Evaluation & Treatment Note  Patient Identification: Melinda [name] MRN:  [id number] Date of Evaluation:  10/24/2017 TX #: [id number]  MADRS:   MMSE:   P.E. Findings:  Change to physical exam no change to physical exam  Psychiatric Interval Note:  Follow-up in 3 weeks.  Mood is stable  Subjective:  Patient is a 46 y.o. female seen for evaluation for Electroconvulsive Therapy. No specific new complaint  Treatment Summary:   []   Right Unilateral             [x]  Bilateral   % Energy : 1.0 ms 35%   Impedance: 1100 ohms  Seizure Energy Index: 5008 V squared  Postictal Suppression Index: 34%  Seizure Concordance Index: 90%  Medications  Pre Shock: Robinul 0.4 mg labetalol 20 mg esmolol 10 mg Brevital 70 mg succinylcholine 100 mg  Post Shock:    Seizure Duration: 37 seconds by EMG 90 seconds by EEG   Comments: Follow-up 3 weeks  Lungs:  [x]   Clear to auscultation               []  Other:   Heart:    [x]   Regular rhythm             []  irregular rhythm    [x]   Previous H&P reviewed, patient examined and there are NO CHANGES                 []   Previous H&P reviewed, patient examined and there are changes noted.   [name], MD 12/14/201810:30 AM

## 2017-10-24 NOTE — Anesthesia Post-op Follow-up Note (Signed)
Anesthesia QCDR form completed.        

## 2017-10-24 NOTE — Anesthesia Preprocedure Evaluation (Signed)
Anesthesia Evaluation  Patient identified by MRN, date of birth, ID band Patient awake    Reviewed: Allergy & Precautions, H&P , NPO status , Patient's Chart, lab work & pertinent test results, reviewed documented beta blocker date and time   Airway Mallampati: II   Neck ROM: full    Dental  (+) Poor Dentition   Pulmonary neg pulmonary ROS, sleep apnea ,    Pulmonary exam normal        Cardiovascular hypertension, negative cardio ROS Normal cardiovascular exam Rhythm:regular Rate:Normal     Neuro/Psych PSYCHIATRIC DISORDERS  Neuromuscular disease negative neurological ROS  negative psych ROS   GI/Hepatic negative GI ROS, Neg liver ROS, GERD  Medicated,  Endo/Other  negative endocrine ROSdiabetes  Renal/GU negative Renal ROS  negative genitourinary   Musculoskeletal   Abdominal   Peds  Hematology negative hematology ROS (+)   Anesthesia Other Findings Past Medical History: No date: Depression No date: Diabetes mellitus without complication ([place]) 1/32/44: Diabetic peripheral neuropathy ([place]) 03/07/15: Diabetic peripheral neuropathy ([place]) 03/07/15: Diabetic peripheral neuropathy ([facility]) No date: GERD (gastroesophageal reflux disease) 03/07/15: Hypercholesterolemia No date: Hypertension 03/07/15: Obesity 03/07/15: Personality disorder ([place]) 03/07/15: Sinus tachycardia     Comment:  history of 03/07/15: Suicidal thoughts Past Surgical History: 03/07/15: electroconvulsion therapy BMI    Body Mass Index:  43.05 kg/m     Reproductive/Obstetrics negative OB ROS                             Anesthesia Physical Anesthesia Plan  ASA: III  Anesthesia Plan: General   Post-op Pain Management:    Induction:   PONV Risk Score and Plan:   Airway Management Planned:   Additional Equipment:   Intra-op Plan:   Post-operative Plan:   Informed Consent: I have reviewed the patients History and  Physical, chart, labs and discussed the procedure including the risks, benefits and alternatives for the proposed anesthesia with the patient or authorized representative who has indicated his/her understanding and acceptance.   Dental Advisory Given  Plan Discussed with: CRNA  Anesthesia Plan Comments:         Anesthesia Quick Evaluation

## 2017-10-24 NOTE — Discharge Instructions (Signed)
1)  The drugs that you have been given will stay in your system until tomorrow so for the       next 24 hours you should not:  A. Drive an automobile  B. Make any legal decisions  C. Drink any alcoholic beverages  2)  You may resume your regular meals upon return home.  3)  A responsible adult must take you home.  Someone should stay with you for a few          hours, then be available by phone for the remainder of the treatment day.  4)  You May experience any of the following symptoms:  Headache, Nausea and a dry mouth (due to the medications you were given),  temporary memory loss and some confusion, or sore muscles (a warm bath  should help this).  If you you experience any of these symptoms let us know on                your return visit.  5)  Report any of the following: any acute discomfort, severe headache, or temperature        greater than 100.5 F.   Also report any unusual redness, swelling, drainage, or pain         at your IV site.    You may report Symptoms to:  [place] at [facility]          Phone: [phone], ECT Department           or Dr. Prescott Gum office [phone]  6)  Your next ECT Treatment is Friday January  4,2018  8:30   We will call 2 days prior to your scheduled appointment for arrival times.  7)  Nothing to eat or drink after midnight the night before your procedure.  8)  Take   With a sip of water the morning of your procedure.  9)  Other Instructions: Call [phone] to cancel the morning of your procedure due         to illness or emergency.  10) We will call within 72 hours to assess how you are feeling.

## 2017-11-13 ENCOUNTER — Other Ambulatory Visit: Payer: Self-pay | Admitting: Psychiatry

## 2017-11-13 ENCOUNTER — Telehealth (HOSPITAL_COMMUNITY): Payer: Self-pay | Admitting: *Deleted

## 2017-11-13 NOTE — Telephone Encounter (Signed)
Called for extension of ECT authorization. Patient had 1 unused session from 2018. Spoke with Legrand Como who gave approval to extend date to 11/14/17. Josem Kaufmann #[phone]. Will call back for more sessions once all have been complete.

## 2017-11-14 ENCOUNTER — Encounter: Payer: Self-pay | Admitting: Anesthesiology

## 2017-11-14 ENCOUNTER — Ambulatory Visit
Admission: RE | Admit: 2017-11-14 | Discharge: 2017-11-14 | Disposition: A | Discharge status: Home / Self Care | Payer: Medicare PPO | Source: Ambulatory Visit | Attending: Psychiatry | Admitting: Psychiatry

## 2017-11-14 DIAGNOSIS — F319 Bipolar disorder, unspecified: Secondary | ICD-10-CM | POA: Diagnosis not present

## 2017-11-14 DIAGNOSIS — F332 Major depressive disorder, recurrent severe without psychotic features: Secondary | ICD-10-CM | POA: Diagnosis not present

## 2017-11-14 DIAGNOSIS — E7800 Pure hypercholesterolemia, unspecified: Secondary | ICD-10-CM | POA: Diagnosis not present

## 2017-11-14 DIAGNOSIS — I10 Essential (primary) hypertension: Secondary | ICD-10-CM | POA: Diagnosis not present

## 2017-11-14 DIAGNOSIS — Z888 Allergy status to other drugs, medicaments and biological substances status: Secondary | ICD-10-CM | POA: Insufficient documentation

## 2017-11-14 DIAGNOSIS — E669 Obesity, unspecified: Secondary | ICD-10-CM | POA: Insufficient documentation

## 2017-11-14 DIAGNOSIS — K219 Gastro-esophageal reflux disease without esophagitis: Secondary | ICD-10-CM | POA: Diagnosis not present

## 2017-11-14 DIAGNOSIS — E1142 Type 2 diabetes mellitus with diabetic polyneuropathy: Secondary | ICD-10-CM | POA: Diagnosis not present

## 2017-11-14 DIAGNOSIS — F609 Personality disorder, unspecified: Secondary | ICD-10-CM | POA: Diagnosis not present

## 2017-11-14 DIAGNOSIS — Z6841 Body Mass Index (BMI) 40.0 and over, adult: Secondary | ICD-10-CM | POA: Insufficient documentation

## 2017-11-14 DIAGNOSIS — F329 Major depressive disorder, single episode, unspecified: Secondary | ICD-10-CM | POA: Insufficient documentation

## 2017-11-14 DIAGNOSIS — F339 Major depressive disorder, recurrent, unspecified: Secondary | ICD-10-CM | POA: Diagnosis not present

## 2017-11-14 DIAGNOSIS — G4730 Sleep apnea, unspecified: Secondary | ICD-10-CM | POA: Diagnosis not present

## 2017-11-14 DIAGNOSIS — Z915 Personal history of self-harm: Secondary | ICD-10-CM | POA: Diagnosis not present

## 2017-11-14 DIAGNOSIS — E1140 Type 2 diabetes mellitus with diabetic neuropathy, unspecified: Secondary | ICD-10-CM | POA: Diagnosis not present

## 2017-11-14 LAB — GLUCOSE, CAPILLARY
Glucose-Capillary: 113 mg/dL — ABNORMAL HIGH (ref 65–99)
Glucose-Capillary: 113 mg/dL — ABNORMAL HIGH (ref 65–99)

## 2017-11-14 MED ORDER — KETOROLAC TROMETHAMINE 30 MG/ML IJ SOLN
30.0000 mg | Freq: Once | INTRAMUSCULAR | Status: AC
  Administered 2017-11-14: 30 mg via INTRAVENOUS

## 2017-11-14 MED ORDER — LABETALOL HCL 5 MG/ML IV SOLN
INTRAVENOUS | Status: DC | PRN
  Administered 2017-11-14: 20 mg via INTRAVENOUS

## 2017-11-14 MED ORDER — METHOHEXITAL SODIUM 100 MG/10ML IV SOSY
PREFILLED_SYRINGE | INTRAVENOUS | Status: DC | PRN
  Administered 2017-11-14: 70 mg via INTRAVENOUS

## 2017-11-14 MED ORDER — SUCCINYLCHOLINE CHLORIDE 200 MG/10ML IV SOSY
PREFILLED_SYRINGE | INTRAVENOUS | Status: DC | PRN
  Administered 2017-11-14: 100 mg via INTRAVENOUS

## 2017-11-14 MED ORDER — SODIUM CHLORIDE 0.9 % IV SOLN
500.0000 mL | Freq: Once | INTRAVENOUS | Status: AC
  Administered 2017-11-14: 500 mL via INTRAVENOUS

## 2017-11-14 MED ORDER — GLYCOPYRROLATE 0.2 MG/ML IJ SOLN
0.3000 mg | Freq: Once | INTRAMUSCULAR | Status: AC
  Administered 2017-11-14: 0.3 mg via INTRAVENOUS

## 2017-11-14 MED ORDER — ESMOLOL HCL 100 MG/10ML IV SOLN
INTRAVENOUS | Status: DC | PRN
  Administered 2017-11-14: 20 mg via INTRAVENOUS

## 2017-11-14 MED ORDER — SODIUM CHLORIDE 0.9 % IV SOLN
INTRAVENOUS | Status: DC | PRN
  Administered 2017-11-14: 09:00:00 via INTRAVENOUS

## 2017-11-14 NOTE — Discharge Instructions (Addendum)
1)  The drugs that you have been given will stay in your system until tomorrow so for the       next 24 hours you should not:  A. Drive an automobile  B. Make any legal decisions  C. Drink any alcoholic beverages  2)  You may resume your regular meals upon return home.  3)  A responsible adult must take you home.  Someone should stay with you for a few          hours, then be available by phone for the remainder of the treatment day.  4)  You May experience any of the following symptoms:  Headache, Nausea and a dry mouth (due to the medications you were given),  temporary memory loss and some confusion, or sore muscles (a warm bath  should help this).  If you you experience any of these symptoms let us know on                your return visit.  5)  Report any of the following: any acute discomfort, severe headache, or temperature        greater than 100.5 F.   Also report any unusual redness, swelling, drainage, or pain         at your IV site.    You may report Symptoms to:  [place] [facility]          Phone: [phone], ECT Department           or Dr. Prescott Gum office [phone]  6)  Your next ECT Treatment is Friday November 28, 2017  We will call 2 days prior to your scheduled appointment for arrival times.  7)  Nothing to eat or drink after midnight the night before your procedure.  8)  Take    With a sip of water the morning of your procedure.  9)  Other Instructions: Call [phone] to cancel the morning of your procedure due         to illness or emergency.  10) We will call within 72 hours to assess how you are feeling.

## 2017-11-14 NOTE — Anesthesia Preprocedure Evaluation (Signed)
Anesthesia Evaluation  Patient identified by MRN, date of birth, ID band Patient awake    Reviewed: Allergy & Precautions, H&P , NPO status , Patient's Chart, lab work & pertinent test results  History of Anesthesia Complications Negative for: history of anesthetic complications  Airway Mallampati: II  TM Distance: >3 FB Neck ROM: full    Dental  (+) Poor Dentition, Chipped   Pulmonary sleep apnea , neg COPD,    Pulmonary exam normal breath sounds clear to auscultation       Cardiovascular hypertension, Pt. on medications (-) CAD and (-) Past MI negative cardio ROS Normal cardiovascular exam Rhythm:regular Rate:Normal     Neuro/Psych PSYCHIATRIC DISORDERS Depression Bipolar Disorder  Neuromuscular disease negative neurological ROS     GI/Hepatic negative GI ROS, Neg liver ROS, GERD  Controlled,  Endo/Other  diabetes, Type 2, Oral Hypoglycemic Agents  Renal/GU negative Renal ROS  negative genitourinary   Musculoskeletal   Abdominal (+) + obese,   Peds  Hematology negative hematology ROS (+)   Anesthesia Other Findings Past Medical History: No date: Depression No date: Diabetes mellitus without complication ([facility]) 03/07/15: Diabetic peripheral neuropathy ([facility]) 03/07/15: Diabetic peripheral neuropathy ([facility]) 03/07/15: Diabetic peripheral neuropathy ([facility]) No date: GERD (gastroesophageal reflux disease) 03/07/15: Hypercholesterolemia No date: Hypertension 03/07/15: Obesity 03/07/15: Personality disorder 03/07/15: Sinus tachycardia ([facility])     Comment: history of 03/07/15: Suicidal thoughts Past Surgical History: 03/07/15: electroconvulsion therapy BMI    Body Mass Index:  36.44 kg/m     Reproductive/Obstetrics                             Anesthesia Physical  Anesthesia Plan  ASA: III  Anesthesia Plan: General   Post-op Pain Management:    Induction: Intravenous  PONV Risk Score and  Plan:   Airway Management Planned: Mask  Additional Equipment:   Intra-op Plan:   Post-operative Plan:   Informed Consent: I have reviewed the patients History and Physical, chart, labs and discussed the procedure including the risks, benefits and alternatives for the proposed anesthesia with the patient or authorized representative who has indicated his/her understanding and acceptance.   Dental Advisory Given  Plan Discussed with: CRNA and Anesthesiologist  Anesthesia Plan Comments: (Patient consented for risks of anesthesia including but not limited to:  - adverse reactions to medications - damage to teeth, lips or other oral mucosa - sore throat or hoarseness - Damage to heart, brain, lungs or loss of life  Patient voiced understanding.)        Anesthesia Quick Evaluation  

## 2017-11-14 NOTE — H&P (Signed)
Melinda [name] is an 47 y.o. female.   Chief Complaint: Feeling worse after 3 weeks feeling more depressed and down.  Having more thoughts about self injury HPI: History of chronic depression chronic self injury and suicide passive that he.  Some benefit from maintenance ECT.  Past Medical History:  Diagnosis Date  . Depression   . Diabetes mellitus without complication ([place])   . Diabetic peripheral neuropathy ([place]) 03/07/15  . Diabetic peripheral neuropathy ([place]) 03/07/15  . Diabetic peripheral neuropathy ([place]) 03/07/15  . GERD (gastroesophageal reflux disease)   . Hypercholesterolemia 03/07/15  . Hypertension   . Obesity 03/07/15  . Personality disorder ([place]) 03/07/15  . Sinus tachycardia 03/07/15   history of  . Suicidal thoughts 03/07/15    Past Surgical History:  Procedure Laterality Date  . electroconvulsion therapy  03/07/15    Family History  Problem Relation Age of Onset  . Hypertension Father   . Diabetes Mother    Social History:  reports that  has never smoked. she has never used smokeless tobacco. She reports that she does not drink alcohol or use drugs.  Allergies:  Allergies  Allergen Reactions  . Prednisone     Increases blood sugar     (Not in a hospital admission)  No results found for this or any previous visit (from the past 48 hour(s)). No results found.  Review of Systems  Constitutional: Negative.   HENT: Negative.   Eyes: Negative.   Respiratory: Negative.   Cardiovascular: Negative.   Gastrointestinal: Negative.   Musculoskeletal: Negative.   Skin: Negative.   Neurological: Negative.   Psychiatric/Behavioral: Positive for depression. Negative for hallucinations, memory loss, substance abuse and suicidal ideas. The patient is nervous/anxious and has insomnia.     Blood pressure 140/88, pulse 98, temperature 98.2 F (36.8 C), temperature source Oral, resp. rate 18, height 5\' 3"  (1.6 m), weight 247 lb (112 kg), SpO2 99 %. Physical Exam   Nursing note and vitals reviewed. Constitutional: She appears well-developed and well-nourished.  HENT:  Head: Normocephalic and atraumatic.  Eyes: Conjunctivae are normal. Pupils are equal, round, and reactive to light.  Neck: Normal range of motion.  Cardiovascular: Regular rhythm and normal heart sounds.  Respiratory: Effort normal. No respiratory distress.  GI: Soft. She exhibits no distension.  Musculoskeletal: Normal range of motion.  Neurological: She is alert.  Skin: Skin is warm and dry.  Psychiatric: Her mood appears anxious. Her speech is delayed. She is slowed. Cognition and memory are normal. She expresses impulsivity. She exhibits a depressed mood. She expresses no homicidal and no suicidal ideation.     Assessment/Plan Spent some time doing supportive therapy.  Continue ECT at 2 weeks.  Patient is requesting to go back to 1 week schedule but that I think was still keeping her down a little bit more than necessary.  We will see her back in 2 weeks with ongoing reassessment  [name], MD 11/14/2017, 10:17 AM

## 2017-11-14 NOTE — Anesthesia Postprocedure Evaluation (Signed)
Anesthesia Post Note  Patient: Melinda [name]  Procedure(s) Performed: ECT TX  Patient location during evaluation: PACU Anesthesia Type: General Level of consciousness: awake and alert Pain management: pain level controlled Vital Signs Assessment: post-procedure vital signs reviewed and stable Respiratory status: spontaneous breathing, nonlabored ventilation, respiratory function stable and patient connected to nasal cannula oxygen Cardiovascular status: blood pressure returned to baseline and stable Postop Assessment: no apparent nausea or vomiting Anesthetic complications: no     Last Vitals:  Vitals:   11/14/17 1056 11/14/17 1110  BP: (!) 144/84 (!) 161/76  Pulse: 90 80  Resp: 18 18  Temp: 37 C 37 C  SpO2: 94%     Last Pain:  Vitals:   11/14/17 1110  TempSrc: Oral  PainSc:                  Martha Clan

## 2017-11-14 NOTE — Anesthesia Post-op Follow-up Note (Signed)
Anesthesia QCDR form completed.        

## 2017-11-14 NOTE — Transfer of Care (Signed)
Immediate Anesthesia Transfer of Care Note  Patient: Melinda [name]  Procedure(s) Performed: ECT TX  Patient Location: PACU  Anesthesia Type:General  Level of Consciousness: sedated  Airway & Oxygen Therapy: Patient Spontanous Breathing and Patient connected to face mask oxygen  Post-op Assessment: Report given to RN and Post -op Vital signs reviewed and stable  Post vital signs: Reviewed and stable  Last Vitals:  Vitals:   11/14/17 0835  BP: 140/88  Pulse: 98  Resp: 18  Temp: 36.8 C  SpO2: 99%    Last Pain:  Vitals:   11/14/17 0835  TempSrc: Oral         Complications: No apparent anesthesia complications

## 2017-11-14 NOTE — Procedures (Signed)
ECT SERVICES Physician's Interval Evaluation & Treatment Note  Patient Identification: Melinda [name] MRN:  [id number] Date of Evaluation:  11/14/2017 TX #: [id number]  MADRS:   MMSE:   P.E. Findings:  No change to physical exam  Psychiatric Interval Note:  Mood is reported as feeling more anxious more stressed more down and having more thoughts of self injury although not suicidal  Subjective:  Patient is a 47 y.o. female seen for evaluation for Electroconvulsive Therapy. Talking more about cutting herself but not suicidal not psychotic  Treatment Summary:   []   Right Unilateral             [x]  Bilateral   % Energy : 1.0 ms 35%   Impedance: 1250 ohms  Seizure Energy Index: 13,670 V squared  Postictal Suppression Index: 46%  Seizure Concordance Index: 98%  Medications  Pre Shock: Robinul 0.4 mg labetalol 20 mg esmolol 10 mg Brevital 70 mg succinylcholine 100 mg  Post Shock:    Seizure Duration: 44 seconds EMG 110 seconds EEG   Comments: Follow-up in 2 weeks  Lungs:  [x]   Clear to auscultation               []  Other:   Heart:    [x]   Regular rhythm             []  irregular rhythm    [x]   Previous H&P reviewed, patient examined and there are NO CHANGES                 []   Previous H&P reviewed, patient examined and there are changes noted.   [name], MD 1/4/201910:19 AM

## 2017-11-14 NOTE — Anesthesia Procedure Notes (Signed)
Date/Time: 11/14/2017 10:27 AM Performed by: Dionne Bucy, CRNA Pre-anesthesia Checklist: Patient identified, Emergency Drugs available, Suction available and Patient being monitored Patient Re-evaluated:Patient Re-evaluated prior to induction Oxygen Delivery Method: Circle system utilized Preoxygenation: Pre-oxygenation with 100% oxygen Induction Type: IV induction Ventilation: Mask ventilation without difficulty and Mask ventilation throughout procedure Airway Equipment and Method: Bite block Placement Confirmation: positive ETCO2 Dental Injury: Teeth and Oropharynx as per pre-operative assessment

## 2017-11-26 ENCOUNTER — Telehealth: Payer: Self-pay

## 2017-11-27 ENCOUNTER — Telehealth (HOSPITAL_COMMUNITY): Payer: Self-pay | Admitting: *Deleted

## 2017-11-27 NOTE — Telephone Encounter (Signed)
Called for concurrent review for outpatient ECT. Spoke with Marjory Lies who gave 12 sessions from 11/27/17-03/26/18. Auth #[id number].

## 2017-11-28 ENCOUNTER — Telehealth: Payer: Self-pay

## 2017-11-28 ENCOUNTER — Encounter: Payer: Self-pay | Admitting: Anesthesiology

## 2017-11-28 ENCOUNTER — Other Ambulatory Visit: Payer: Self-pay | Admitting: Psychiatry

## 2017-11-28 ENCOUNTER — Encounter
Admission: RE | Admit: 2017-11-28 | Discharge: 2017-11-28 | Disposition: A | Discharge status: Home / Self Care | Payer: Medicare PPO | Source: Ambulatory Visit | Attending: Psychiatry | Admitting: Psychiatry

## 2017-11-28 DIAGNOSIS — Z833 Family history of diabetes mellitus: Secondary | ICD-10-CM | POA: Insufficient documentation

## 2017-11-28 DIAGNOSIS — F609 Personality disorder, unspecified: Secondary | ICD-10-CM | POA: Insufficient documentation

## 2017-11-28 DIAGNOSIS — I10 Essential (primary) hypertension: Secondary | ICD-10-CM | POA: Diagnosis not present

## 2017-11-28 DIAGNOSIS — Z6837 Body mass index (BMI) 37.0-37.9, adult: Secondary | ICD-10-CM | POA: Insufficient documentation

## 2017-11-28 DIAGNOSIS — F339 Major depressive disorder, recurrent, unspecified: Secondary | ICD-10-CM | POA: Diagnosis not present

## 2017-11-28 DIAGNOSIS — K219 Gastro-esophageal reflux disease without esophagitis: Secondary | ICD-10-CM | POA: Insufficient documentation

## 2017-11-28 DIAGNOSIS — F332 Major depressive disorder, recurrent severe without psychotic features: Secondary | ICD-10-CM | POA: Diagnosis not present

## 2017-11-28 DIAGNOSIS — E7800 Pure hypercholesterolemia, unspecified: Secondary | ICD-10-CM | POA: Insufficient documentation

## 2017-11-28 DIAGNOSIS — E1142 Type 2 diabetes mellitus with diabetic polyneuropathy: Secondary | ICD-10-CM | POA: Diagnosis not present

## 2017-11-28 DIAGNOSIS — Z8249 Family history of ischemic heart disease and other diseases of the circulatory system: Secondary | ICD-10-CM | POA: Diagnosis not present

## 2017-11-28 DIAGNOSIS — E669 Obesity, unspecified: Secondary | ICD-10-CM | POA: Diagnosis not present

## 2017-11-28 DIAGNOSIS — F319 Bipolar disorder, unspecified: Secondary | ICD-10-CM | POA: Diagnosis not present

## 2017-11-28 DIAGNOSIS — E1140 Type 2 diabetes mellitus with diabetic neuropathy, unspecified: Secondary | ICD-10-CM | POA: Diagnosis not present

## 2017-11-28 LAB — GLUCOSE, CAPILLARY: Glucose-Capillary: 120 mg/dL — ABNORMAL HIGH (ref 65–99)

## 2017-11-28 LAB — POCT PREGNANCY, URINE: Preg Test, Ur: NEGATIVE

## 2017-11-28 MED ORDER — ONDANSETRON HCL 4 MG/2ML IJ SOLN: 4.0000 mg | Freq: Once | INTRAMUSCULAR | Status: DC | PRN

## 2017-11-28 MED ORDER — KETOROLAC TROMETHAMINE 30 MG/ML IJ SOLN
30.0000 mg | Freq: Once | INTRAMUSCULAR | Status: AC
  Administered 2017-11-28: 30 mg via INTRAVENOUS

## 2017-11-28 MED ORDER — ESCITALOPRAM OXALATE 20 MG PO TABS: 20.0000 mg | ORAL_TABLET | Freq: Every day | ORAL | 0 refills | Status: DC

## 2017-11-28 MED ORDER — SODIUM CHLORIDE 0.9 % IV SOLN
500.0000 mL | Freq: Once | INTRAVENOUS | Status: AC
  Administered 2017-11-28: 500 mL via INTRAVENOUS

## 2017-11-28 MED ORDER — FENTANYL CITRATE (PF) 100 MCG/2ML IJ SOLN: 25.0000 ug | INTRAMUSCULAR | Status: DC | PRN

## 2017-11-28 MED ORDER — GLYCOPYRROLATE 0.2 MG/ML IJ SOLN
0.3000 mg | Freq: Once | INTRAMUSCULAR | Status: AC
  Administered 2017-11-28: 0.3 mg via INTRAVENOUS

## 2017-11-28 MED ORDER — ESMOLOL HCL 100 MG/10ML IV SOLN
INTRAVENOUS | Status: AC
  Filled 2017-11-28: qty 10

## 2017-11-28 MED ORDER — KETOROLAC TROMETHAMINE 30 MG/ML IJ SOLN
INTRAMUSCULAR | Status: AC
  Administered 2017-11-28: 30 mg via INTRAVENOUS
  Filled 2017-11-28: qty 1

## 2017-11-28 MED ORDER — ESMOLOL HCL 100 MG/10ML IV SOLN
INTRAVENOUS | Status: DC | PRN
  Administered 2017-11-28: 20 mg via INTRAVENOUS

## 2017-11-28 MED ORDER — LABETALOL HCL 5 MG/ML IV SOLN
INTRAVENOUS | Status: AC
Start: 2017-11-28 — End: ?
  Filled 2017-11-28: qty 4

## 2017-11-28 MED ORDER — SODIUM CHLORIDE 0.9 % IV SOLN
INTRAVENOUS | Status: DC | PRN
  Administered 2017-11-28: 09:00:00 via INTRAVENOUS

## 2017-11-28 MED ORDER — METHOHEXITAL SODIUM 100 MG/10ML IV SOSY
PREFILLED_SYRINGE | INTRAVENOUS | Status: DC | PRN
  Administered 2017-11-28: 70 mg via INTRAVENOUS

## 2017-11-28 MED ORDER — LABETALOL HCL 5 MG/ML IV SOLN
INTRAVENOUS | Status: DC | PRN
  Administered 2017-11-28: 20 mg via INTRAVENOUS

## 2017-11-28 MED ORDER — METHOHEXITAL SODIUM 0.5 G IJ SOLR
INTRAMUSCULAR | Status: AC
  Filled 2017-11-28: qty 500

## 2017-11-28 MED ORDER — GLYCOPYRROLATE 0.2 MG/ML IJ SOLN
INTRAMUSCULAR | Status: AC
  Filled 2017-11-28: qty 2

## 2017-11-28 MED ORDER — SUCCINYLCHOLINE CHLORIDE 200 MG/10ML IV SOSY
PREFILLED_SYRINGE | INTRAVENOUS | Status: DC | PRN
  Administered 2017-11-28: 80 mg via INTRAVENOUS

## 2017-11-28 MED ORDER — SUCCINYLCHOLINE CHLORIDE 20 MG/ML IJ SOLN
INTRAMUSCULAR | Status: AC
  Filled 2017-11-28: qty 1

## 2017-11-28 NOTE — Anesthesia Post-op Follow-up Note (Signed)
Anesthesia QCDR form completed.        

## 2017-11-28 NOTE — Procedures (Signed)
ECT SERVICES Physician's Interval Evaluation & Treatment Note  Patient Identification: Melinda [name] MRN:  [id number] Date of Evaluation:  11/28/2017 TX #: [id number]  MADRS:   MMSE:   P.E. Findings:  No change to physical exam heart and lungs normal vitals unremarkable  Psychiatric Interval Note:  Chronic anxiety and depression no psychosis no new suicidal ideation  Subjective:  Patient is a 47 y.o. female seen for evaluation for Electroconvulsive Therapy. No change from baseline  Treatment Summary:   []   Right Unilateral             [x]  Bilateral   % Energy : 1.0 ms 35%   Impedance: 840 ohms  Seizure Energy Index: 7778 V squared  Postictal Suppression Index: 74%  Seizure Concordance Index: 90%  Medications  Pre Shock: Robinul 0.4 mg Toradol 30 mg labetalol 20 mg esmolol 10 mg Brevital 70 mg succinylcholine 100 mg  Post Shock:    Seizure Duration: 37 seconds by EMG 88 seconds by EEG   Comments: Follow-up 2 weeks  Lungs:  [x]   Clear to auscultation               []  Other:   Heart:    [x]   Regular rhythm             []  irregular rhythm    [x]   Previous H&P reviewed, patient examined and there are NO CHANGES                 []   Previous H&P reviewed, patient examined and there are changes noted.   [name], MD 1/18/201910:12 AM

## 2017-11-28 NOTE — Anesthesia Preprocedure Evaluation (Signed)
Anesthesia Evaluation  Patient identified by MRN, date of birth, ID band Patient awake    Reviewed: Allergy & Precautions, H&P , NPO status , Patient's Chart, lab work & pertinent test results  History of Anesthesia Complications Negative for: history of anesthetic complications  Airway Mallampati: II  TM Distance: >3 FB Neck ROM: full    Dental  (+) Poor Dentition, Chipped   Pulmonary sleep apnea , neg COPD,    Pulmonary exam normal breath sounds clear to auscultation       Cardiovascular hypertension, Pt. on medications (-) CAD and (-) Past MI negative cardio ROS Normal cardiovascular exam Rhythm:regular Rate:Normal     Neuro/Psych PSYCHIATRIC DISORDERS Depression Bipolar Disorder  Neuromuscular disease negative neurological ROS     GI/Hepatic negative GI ROS, Neg liver ROS, GERD  Controlled,  Endo/Other  diabetes, Type 2, Oral Hypoglycemic Agents  Renal/GU negative Renal ROS  negative genitourinary   Musculoskeletal   Abdominal (+) + obese,   Peds  Hematology negative hematology ROS (+)   Anesthesia Other Findings Past Medical History: No date: Depression No date: Diabetes mellitus without complication ([facility]) 03/07/15: Diabetic peripheral neuropathy ([facility]) 03/07/15: Diabetic peripheral neuropathy ([facility]) 03/07/15: Diabetic peripheral neuropathy ([facility]) No date: GERD (gastroesophageal reflux disease) 03/07/15: Hypercholesterolemia No date: Hypertension 03/07/15: Obesity 03/07/15: Personality disorder 03/07/15: Sinus tachycardia ([facility])     Comment: history of 03/07/15: Suicidal thoughts Past Surgical History: 03/07/15: electroconvulsion therapy BMI    Body Mass Index:  36.44 kg/m     Reproductive/Obstetrics                             Anesthesia Physical  Anesthesia Plan  ASA: III  Anesthesia Plan: General   Post-op Pain Management:    Induction: Intravenous  PONV Risk Score and  Plan:   Airway Management Planned: Mask  Additional Equipment:   Intra-op Plan:   Post-operative Plan:   Informed Consent: I have reviewed the patients History and Physical, chart, labs and discussed the procedure including the risks, benefits and alternatives for the proposed anesthesia with the patient or authorized representative who has indicated his/her understanding and acceptance.   Dental Advisory Given  Plan Discussed with: CRNA and Anesthesiologist  Anesthesia Plan Comments: (Patient consented for risks of anesthesia including but not limited to:  - adverse reactions to medications - damage to teeth, lips or other oral mucosa - sore throat or hoarseness - Damage to heart, brain, lungs or loss of life  Patient voiced understanding.)        Anesthesia Quick Evaluation  

## 2017-11-28 NOTE — H&P (Signed)
Melinda [name] is an 47 y.o. female.   Chief Complaint: Chronic depression and anxiety.  No active suicidal ideation.  Some discussion about recent bad dreams.  No psychotic symptoms HPI: Chronic long-standing recurrent depression with some improvement in stability with maintenance ECT  Past Medical History:  Diagnosis Date  . Depression   . Diabetes mellitus without complication ([place])   . Diabetic peripheral neuropathy ([place]) 03/07/15  . Diabetic peripheral neuropathy ([place]) 03/07/15  . Diabetic peripheral neuropathy ([place]) 03/07/15  . GERD (gastroesophageal reflux disease)   . Hypercholesterolemia 03/07/15  . Hypertension   . Obesity 03/07/15  . Personality disorder ([place]) 03/07/15  . Sinus tachycardia 03/07/15   history of  . Suicidal thoughts 03/07/15    Past Surgical History:  Procedure Laterality Date  . electroconvulsion therapy  03/07/15    Family History  Problem Relation Age of Onset  . Hypertension Father   . Diabetes Mother    Social History:  reports that  has never smoked. she has never used smokeless tobacco. She reports that she does not drink alcohol or use drugs.  Allergies:  Allergies  Allergen Reactions  . Prednisone     Increases blood sugar     (Not in a hospital admission)  Results for orders placed or performed during the hospital encounter of 11/28/17 (from the past 48 hour(s))  Glucose, capillary     Status: Abnormal   Collection Time: 11/28/17  8:19 AM  Result Value Ref Range   Glucose-Capillary 120 (H) 65 - 99 mg/dL  Pregnancy, urine POC     Status: None   Collection Time: 11/28/17  8:20 AM  Result Value Ref Range   Preg Test, Ur NEGATIVE NEGATIVE    Comment:        THE SENSITIVITY OF THIS METHODOLOGY IS >24 mIU/mL    No results found.  Review of Systems  Constitutional: Negative.   HENT: Negative.   Eyes: Negative.   Respiratory: Negative.   Cardiovascular: Negative.   Gastrointestinal: Negative.   Musculoskeletal: Negative.   Skin:  Negative.   Neurological: Negative.   Psychiatric/Behavioral: Positive for depression. Negative for hallucinations, memory loss, substance abuse and suicidal ideas. The patient is not nervous/anxious and does not have insomnia.     Blood pressure (!) 170/89, pulse (!) 109, temperature (!) 97.5 F (36.4 C), temperature source Oral, height 5\' 3"  (1.6 m), weight 113.9 kg (251 lb). Physical Exam  Nursing note and vitals reviewed. Constitutional: She appears well-developed and well-nourished.  HENT:  Head: Normocephalic and atraumatic.  Eyes: Conjunctivae are normal. Pupils are equal, round, and reactive to light.  Neck: Normal range of motion.  Cardiovascular: Regular rhythm and normal heart sounds.  Respiratory: Effort normal. No respiratory distress.  GI: Soft.  Musculoskeletal: Normal range of motion.  Neurological: She is alert.  Skin: Skin is warm and dry.  Psychiatric: Her speech is normal and behavior is normal. Judgment and thought content normal. Her mood appears anxious. Cognition and memory are normal. She exhibits a depressed mood.     Assessment/Plan Treatment today follow-up 2 weeks.  Renewed prescription for Lexapro.  [name], MD 11/28/2017, 10:10 AM

## 2017-11-28 NOTE — Anesthesia Procedure Notes (Signed)
Date/Time: 11/28/2017 10:19 AM Performed by: Dionne Bucy, CRNA Pre-anesthesia Checklist: Patient identified, Emergency Drugs available, Suction available and Patient being monitored Patient Re-evaluated:Patient Re-evaluated prior to induction Oxygen Delivery Method: Circle system utilized Preoxygenation: Pre-oxygenation with 100% oxygen Induction Type: IV induction Ventilation: Mask ventilation without difficulty and Mask ventilation throughout procedure Airway Equipment and Method: Bite block Placement Confirmation: positive ETCO2 Dental Injury: Teeth and Oropharynx as per pre-operative assessment

## 2017-11-28 NOTE — Anesthesia Postprocedure Evaluation (Signed)
Anesthesia Post Note  Patient: Melinda [name]  Procedure(s) Performed: ECT TX  Patient location during evaluation: PACU Anesthesia Type: General Level of consciousness: awake and alert Pain management: pain level controlled Vital Signs Assessment: post-procedure vital signs reviewed and stable Respiratory status: spontaneous breathing, nonlabored ventilation, respiratory function stable and patient connected to nasal cannula oxygen Cardiovascular status: blood pressure returned to baseline and stable Postop Assessment: no apparent nausea or vomiting Anesthetic complications: no     Last Vitals:  Vitals:   11/28/17 1102 11/28/17 1123  BP: 139/80   Pulse: 89 75  Resp: (!) 24 16  Temp: 37.1 C   SpO2: 99%     Last Pain:  Vitals:   11/28/17 1038  TempSrc:   PainSc: 0-No pain                 Precious Haws Kentley Blyden

## 2017-11-28 NOTE — Discharge Instructions (Signed)
1)  The drugs that you have been given will stay in your system until tomorrow so for the       next 24 hours you should not:  A. Drive an automobile  B. Make any legal decisions  C. Drink any alcoholic beverages  2)  You may resume your regular meals upon return home.  3)  A responsible adult must take you home.  Someone should stay with you for a few          hours, then be available by phone for the remainder of the treatment day.  4)  You May experience any of the following symptoms:  Headache, Nausea and a dry mouth (due to the medications you were given),  temporary memory loss and some confusion, or sore muscles (a warm bath  should help this).  If you you experience any of these symptoms let us know on                your return visit.  5)  Report any of the following: any acute discomfort, severe headache, or temperature        greater than 100.5 F.   Also report any unusual redness, swelling, drainage, or pain         at your IV site.    You may report Symptoms to:  [place] [facility]          Phone: [phone], ECT Department           or Dr. Prescott Gum office [phone]  6)  Your next ECT Treatment is Friday January 1 at 8:00  We will call 2 days prior to your scheduled appointment for arrival times.  7)  Nothing to eat or drink after midnight the night before your procedure.  8)  Take     With a sip of water the morning of your procedure.  9)  Other Instructions: Call [phone] to cancel the morning of your procedure due         to illness or emergency.  10) We will call within 72 hours to assess how you are feeling.

## 2017-11-28 NOTE — Transfer of Care (Signed)
Immediate Anesthesia Transfer of Care Note  Patient: Melinda [name]  Procedure(s) Performed: ECT TX  Patient Location: PACU  Anesthesia Type:General  Level of Consciousness: sedated  Airway & Oxygen Therapy: Patient Spontanous Breathing and Patient connected to face mask oxygen  Post-op Assessment: Report given to RN and Post -op Vital signs reviewed and stable  Post vital signs: Reviewed and stable  Last Vitals:  Vitals:   11/28/17 0830  BP: (!) 170/89  Pulse: (!) 109  Temp: (!) 36.4 C    Last Pain:  Vitals:   11/28/17 0830  TempSrc: Oral  PainSc: 1          Complications: No apparent anesthesia complications

## 2017-11-28 NOTE — OR Nursing (Signed)
Patient appears to have slight back and forth motion of her head

## 2017-12-03 DIAGNOSIS — E119 Type 2 diabetes mellitus without complications: Secondary | ICD-10-CM | POA: Diagnosis not present

## 2017-12-03 DIAGNOSIS — E782 Mixed hyperlipidemia: Secondary | ICD-10-CM | POA: Diagnosis not present

## 2017-12-12 ENCOUNTER — Ambulatory Visit
Admission: RE | Admit: 2017-12-12 | Discharge: 2017-12-12 | Disposition: A | Discharge status: Home / Self Care | Payer: Medicare PPO | Source: Ambulatory Visit | Attending: Psychiatry | Admitting: Psychiatry

## 2017-12-12 ENCOUNTER — Other Ambulatory Visit: Payer: Self-pay | Admitting: Psychiatry

## 2017-12-12 ENCOUNTER — Encounter: Payer: Self-pay | Admitting: Anesthesiology

## 2017-12-12 DIAGNOSIS — E7800 Pure hypercholesterolemia, unspecified: Secondary | ICD-10-CM | POA: Insufficient documentation

## 2017-12-12 DIAGNOSIS — G4730 Sleep apnea, unspecified: Secondary | ICD-10-CM | POA: Diagnosis not present

## 2017-12-12 DIAGNOSIS — E1142 Type 2 diabetes mellitus with diabetic polyneuropathy: Secondary | ICD-10-CM | POA: Insufficient documentation

## 2017-12-12 DIAGNOSIS — Z833 Family history of diabetes mellitus: Secondary | ICD-10-CM | POA: Diagnosis not present

## 2017-12-12 DIAGNOSIS — F332 Major depressive disorder, recurrent severe without psychotic features: Secondary | ICD-10-CM | POA: Diagnosis not present

## 2017-12-12 DIAGNOSIS — F329 Major depressive disorder, single episode, unspecified: Secondary | ICD-10-CM | POA: Insufficient documentation

## 2017-12-12 DIAGNOSIS — F609 Personality disorder, unspecified: Secondary | ICD-10-CM | POA: Diagnosis not present

## 2017-12-12 DIAGNOSIS — Z6841 Body Mass Index (BMI) 40.0 and over, adult: Secondary | ICD-10-CM | POA: Insufficient documentation

## 2017-12-12 DIAGNOSIS — E669 Obesity, unspecified: Secondary | ICD-10-CM | POA: Insufficient documentation

## 2017-12-12 DIAGNOSIS — F419 Anxiety disorder, unspecified: Secondary | ICD-10-CM | POA: Diagnosis not present

## 2017-12-12 DIAGNOSIS — I10 Essential (primary) hypertension: Secondary | ICD-10-CM | POA: Insufficient documentation

## 2017-12-12 DIAGNOSIS — Z888 Allergy status to other drugs, medicaments and biological substances status: Secondary | ICD-10-CM | POA: Diagnosis not present

## 2017-12-12 DIAGNOSIS — F339 Major depressive disorder, recurrent, unspecified: Secondary | ICD-10-CM | POA: Diagnosis not present

## 2017-12-12 DIAGNOSIS — E1140 Type 2 diabetes mellitus with diabetic neuropathy, unspecified: Secondary | ICD-10-CM | POA: Diagnosis not present

## 2017-12-12 DIAGNOSIS — F319 Bipolar disorder, unspecified: Secondary | ICD-10-CM | POA: Diagnosis not present

## 2017-12-12 LAB — GLUCOSE, CAPILLARY
Glucose-Capillary: 143 mg/dL — ABNORMAL HIGH (ref 65–99)
Glucose-Capillary: 145 mg/dL — ABNORMAL HIGH (ref 65–99)

## 2017-12-12 MED ORDER — GLYCOPYRROLATE 0.2 MG/ML IJ SOLN
INTRAMUSCULAR | Status: AC
  Administered 2017-12-12: 0.4 mg via INTRAVENOUS
  Filled 2017-12-12: qty 2

## 2017-12-12 MED ORDER — GLYCOPYRROLATE 0.2 MG/ML IJ SOLN
0.4000 mg | Freq: Once | INTRAMUSCULAR | Status: AC
  Administered 2017-12-12: 0.4 mg via INTRAVENOUS

## 2017-12-12 MED ORDER — SODIUM CHLORIDE 0.9 % IV SOLN
INTRAVENOUS | Status: DC | PRN
  Administered 2017-12-12: 09:00:00 via INTRAVENOUS

## 2017-12-12 MED ORDER — METHOHEXITAL SODIUM 0.5 G IJ SOLR
INTRAMUSCULAR | Status: AC
  Filled 2017-12-12: qty 500

## 2017-12-12 MED ORDER — ESMOLOL HCL 100 MG/10ML IV SOLN
INTRAVENOUS | Status: DC | PRN
  Administered 2017-12-12: 20 mg via INTRAVENOUS

## 2017-12-12 MED ORDER — METHOHEXITAL SODIUM 100 MG/10ML IV SOSY
PREFILLED_SYRINGE | INTRAVENOUS | Status: DC | PRN
  Administered 2017-12-12: 70 mg via INTRAVENOUS

## 2017-12-12 MED ORDER — ESMOLOL HCL 100 MG/10ML IV SOLN
INTRAVENOUS | Status: AC
  Filled 2017-12-12: qty 10

## 2017-12-12 MED ORDER — LABETALOL HCL 5 MG/ML IV SOLN
INTRAVENOUS | Status: AC
  Filled 2017-12-12: qty 4

## 2017-12-12 MED ORDER — LABETALOL HCL 5 MG/ML IV SOLN
INTRAVENOUS | Status: DC | PRN
  Administered 2017-12-12: 20 mg via INTRAVENOUS

## 2017-12-12 MED ORDER — SUCCINYLCHOLINE CHLORIDE 200 MG/10ML IV SOSY
PREFILLED_SYRINGE | INTRAVENOUS | Status: DC | PRN
  Administered 2017-12-12: 100 mg via INTRAVENOUS

## 2017-12-12 MED ORDER — SODIUM CHLORIDE 0.9 % IV SOLN
500.0000 mL | Freq: Once | INTRAVENOUS | Status: AC
  Administered 2017-12-12: 500 mL via INTRAVENOUS

## 2017-12-12 MED ORDER — SUCCINYLCHOLINE CHLORIDE 20 MG/ML IJ SOLN
INTRAMUSCULAR | Status: AC
  Filled 2017-12-12: qty 1

## 2017-12-12 MED ORDER — KETOROLAC TROMETHAMINE 30 MG/ML IJ SOLN
30.0000 mg | Freq: Once | INTRAMUSCULAR | Status: AC
  Administered 2017-12-12: 30 mg via INTRAVENOUS

## 2017-12-12 MED ORDER — KETOROLAC TROMETHAMINE 30 MG/ML IJ SOLN
INTRAMUSCULAR | Status: AC
  Administered 2017-12-12: 30 mg via INTRAVENOUS
  Filled 2017-12-12: qty 1

## 2017-12-12 NOTE — Anesthesia Procedure Notes (Signed)
Date/Time: 12/12/2017 10:45 AM Performed by: Dionne Bucy, CRNA Pre-anesthesia Checklist: Patient identified, Emergency Drugs available, Suction available and Patient being monitored Patient Re-evaluated:Patient Re-evaluated prior to induction Oxygen Delivery Method: Circle system utilized Preoxygenation: Pre-oxygenation with 100% oxygen Induction Type: IV induction Ventilation: Mask ventilation without difficulty and Mask ventilation throughout procedure Airway Equipment and Method: Bite block Placement Confirmation: positive ETCO2 Dental Injury: Teeth and Oropharynx as per pre-operative assessment

## 2017-12-12 NOTE — Discharge Instructions (Signed)
1)  The drugs that you have been given will stay in your system until tomorrow so for the       next 24 hours you should not:  A. Drive an automobile  B. Make any legal decisions  C. Drink any alcoholic beverages  2)  You may resume your regular meals upon return home.  3)  A responsible adult must take you home.  Someone should stay with you for a few          hours, then be available by phone for the remainder of the treatment day.  4)  You May experience any of the following symptoms:  Headache, Nausea and a dry mouth (due to the medications you were given),  temporary memory loss and some confusion, or sore muscles (a warm bath  should help this).  If you you experience any of these symptoms let us know on                your return visit.  5)  Report any of the following: any acute discomfort, severe headache, or temperature        greater than 100.5 F.   Also report any unusual redness, swelling, drainage, or pain         at your IV site.    You may report Symptoms to:  [place] [facility]          Phone: [phone], ECT Department           or Dr. Prescott Gum office [phone]  6)  Your next ECT Treatment is Friday February  15 at 8:00  We will call 2 days prior to your scheduled appointment for arrival times.  7)  Nothing to eat or drink after midnight the night before your procedure.  8)  Take      With a sip of water the morning of your procedure.  9)  Other Instructions: Call [phone] to cancel the morning of your procedure due         to illness or emergency.  10) We will call within 72 hours to assess how you are feeling.

## 2017-12-12 NOTE — Anesthesia Postprocedure Evaluation (Signed)
Anesthesia Post Note  Patient: Melinda [name]  Procedure(s) Performed: ECT TX  Patient location during evaluation: PACU Anesthesia Type: General Level of consciousness: awake and alert Pain management: pain level controlled Vital Signs Assessment: post-procedure vital signs reviewed and stable Respiratory status: spontaneous breathing, nonlabored ventilation and respiratory function stable Cardiovascular status: blood pressure returned to baseline and stable Postop Assessment: no signs of nausea or vomiting Anesthetic complications: no     Last Vitals:  Vitals:   12/12/17 1122 12/12/17 1134  BP: (!) 153/86 139/79  Pulse: 91 89  Resp: 19 16  Temp:  37.1 C  SpO2: 98%     Last Pain:  Vitals:   12/12/17 1134  TempSrc: Oral  PainSc:                  Jeromie Gainor

## 2017-12-12 NOTE — Anesthesia Preprocedure Evaluation (Signed)
Anesthesia Evaluation  Patient identified by MRN, date of birth, ID band Patient awake    Reviewed: Allergy & Precautions, H&P , NPO status , Patient's Chart, lab work & pertinent test results  History of Anesthesia Complications Negative for: history of anesthetic complications  Airway Mallampati: II  TM Distance: >3 FB Neck ROM: full    Dental  (+) Poor Dentition, Chipped   Pulmonary sleep apnea , neg COPD,    Pulmonary exam normal breath sounds clear to auscultation       Cardiovascular hypertension, Pt. on medications (-) CAD and (-) Past MI negative cardio ROS Normal cardiovascular exam Rhythm:regular Rate:Normal     Neuro/Psych PSYCHIATRIC DISORDERS Depression Bipolar Disorder  Neuromuscular disease negative neurological ROS     GI/Hepatic negative GI ROS, Neg liver ROS, GERD  Medicated,  Endo/Other  diabetes, Type 2, Oral Hypoglycemic Agents  Renal/GU negative Renal ROS  negative genitourinary   Musculoskeletal   Abdominal (+) + obese,   Peds  Hematology negative hematology ROS (+)   Anesthesia Other Findings Past Medical History: No date: Depression No date: Diabetes mellitus without complication ([facility]) 03/07/15: Diabetic peripheral neuropathy ([facility]) 03/07/15: Diabetic peripheral neuropathy ([facility]) 03/07/15: Diabetic peripheral neuropathy ([facility]) No date: GERD (gastroesophageal reflux disease) 03/07/15: Hypercholesterolemia No date: Hypertension 03/07/15: Obesity 03/07/15: Personality disorder 03/07/15: Sinus tachycardia ([facility])     Comment: history of 03/07/15: Suicidal thoughts Past Surgical History: 03/07/15: electroconvulsion therapy BMI    Body Mass Index:  36.44 kg/m     Reproductive/Obstetrics                             Anesthesia Physical  Anesthesia Plan  ASA: III  Anesthesia Plan: General   Post-op Pain Management:    Induction: Intravenous  PONV Risk Score and  Plan: 2 and Ondansetron  Airway Management Planned: Mask  Additional Equipment:   Intra-op Plan:   Post-operative Plan:   Informed Consent: I have reviewed the patients History and Physical, chart, labs and discussed the procedure including the risks, benefits and alternatives for the proposed anesthesia with the patient or authorized representative who has indicated his/her understanding and acceptance.     Dental Advisory Given  Plan Discussed with: CRNA and Anesthesiologist  Anesthesia Plan Comments:         Anesthesia Quick Evaluation  

## 2017-12-12 NOTE — Transfer of Care (Signed)
Immediate Anesthesia Transfer of Care Note  Patient: Melinda [name]  Procedure(s) Performed: ECT TX  Patient Location: PACU  Anesthesia Type:General  Level of Consciousness: sedated  Airway & Oxygen Therapy: Patient Spontanous Breathing and Patient connected to face mask oxygen  Post-op Assessment: Report given to RN and Post -op Vital signs reviewed and stable  Post vital signs: Reviewed and stable  Last Vitals:  Vitals:   12/12/17 0839 12/12/17 1055  BP: (!) 151/84   Pulse: (!) 110 98  Resp:  17  Temp: 37.1 C 37.6 C  SpO2: 98% 99%    Last Pain:  Vitals:   12/12/17 0839  TempSrc: Oral         Complications: No apparent anesthesia complications

## 2017-12-12 NOTE — Anesthesia Post-op Follow-up Note (Signed)
Anesthesia QCDR form completed.        

## 2017-12-12 NOTE — H&P (Signed)
Melinda [name] is an 47 y.o. female.   Chief Complaint: Reports that her nervousness and anxiety depression are higher than average.  Talks a lot about self injury and not suicide. HPI: Long-standing depression and anxiety with partial improvement with ECT  Past Medical History:  Diagnosis Date  . Depression   . Diabetes mellitus without complication ([place])   . Diabetic peripheral neuropathy ([place]) 03/07/15  . Diabetic peripheral neuropathy ([place]) 03/07/15  . Diabetic peripheral neuropathy ([place]) 03/07/15  . GERD (gastroesophageal reflux disease)   . Hypercholesterolemia 03/07/15  . Hypertension   . Obesity 03/07/15  . Personality disorder ([place]) 03/07/15  . Sinus tachycardia 03/07/15   history of  . Suicidal thoughts 03/07/15    Past Surgical History:  Procedure Laterality Date  . electroconvulsion therapy  03/07/15    Family History  Problem Relation Age of Onset  . Hypertension Father   . Diabetes Mother    Social History:  reports that  has never smoked. she has never used smokeless tobacco. She reports that she does not drink alcohol or use drugs.  Allergies:  Allergies  Allergen Reactions  . Prednisone     Increases blood sugar     (Not in a hospital admission)  Results for orders placed or performed during the hospital encounter of 12/12/17 (from the past 48 hour(s))  Glucose, capillary     Status: Abnormal   Collection Time: 12/12/17  8:53 AM  Result Value Ref Range   Glucose-Capillary 143 (H) 65 - 99 mg/dL   No results found.  Review of Systems  Constitutional: Negative.   HENT: Negative.   Eyes: Negative.   Respiratory: Negative.   Cardiovascular: Negative.   Gastrointestinal: Negative.   Musculoskeletal: Negative.   Skin: Negative.   Neurological: Negative.   Psychiatric/Behavioral: Positive for depression. Negative for hallucinations, memory loss, substance abuse and suicidal ideas. The patient is nervous/anxious. The patient does not have insomnia.      Blood pressure (!) 151/84, pulse (!) 110, temperature 98.7 F (37.1 C), temperature source Oral, height 5\' 3"  (1.6 m), weight 113.9 kg (251 lb), SpO2 98 %. Physical Exam  Nursing note and vitals reviewed. Constitutional: She appears well-developed and well-nourished.  HENT:  Head: Normocephalic and atraumatic.  Eyes: Conjunctivae are normal. Pupils are equal, round, and reactive to light.  Neck: Normal range of motion.  Cardiovascular: Regular rhythm and normal heart sounds.  Respiratory: Effort normal. No respiratory distress.  GI: Soft.  Musculoskeletal: Normal range of motion.  Neurological: She is alert.  Skin: Skin is warm and dry.  Psychiatric: Her speech is normal and behavior is normal. Her mood appears anxious. Thought content is not paranoid. Cognition and memory are normal. She expresses impulsivity. She exhibits a depressed mood. She expresses no homicidal and no suicidal ideation.     Assessment/Plan Overall things seem to be doing reasonably well at least in comparison to how they were before, now that we are on every 2-week.  We will continue to week schedule.  No change to medicine.  As usual tried to do some encouragement and supportive counseling with her.  [name], MD 12/12/2017, 10:37 AM

## 2017-12-12 NOTE — Procedures (Signed)
ECT SERVICES Physician's Interval Evaluation & Treatment Note  Patient Identification: OCEANNA ARRUDA MRN:  [id number] Date of Evaluation:  12/12/2017 TX #: [id number]  MADRS: 35  MMSE: 30 P.E. Findings:  No change to physical exam.  Psychiatric Interval Note:  Affect anxious depressed down still focused on wanting to have more pain very negative not actively suicidal  Subjective:  Patient is a 47 y.o. female seen for evaluation for Electroconvulsive Therapy. Feeling depressed and anxious much of the time  Treatment Summary:   []   Right Unilateral             [x]  Bilateral   % Energy : 1.0 ms 35%   Impedance: 940 ohms  Seizure Energy Index: 5635 V squared  Postictal Suppression Index: 41%  Seizure Concordance Index: 92%  Medications  Pre Shock: Robinul 0.4 mg labetalol 20 mg esmolol 10 mg Toradol 30 mg Brevital 70 mg succinylcholine 100 mg  Post Shock:    Seizure Duration: 36 seconds by EMG 96 seconds by EEG   Comments: Follow-up 2 weeks  Lungs:  [x]   Clear to auscultation               []  Other:   Heart:    [x]   Regular rhythm             []  irregular rhythm    [x]   Previous H&P reviewed, patient examined and there are NO CHANGES                 []   Previous H&P reviewed, patient examined and there are changes noted.   [name], MD 2/1/201910:39 AM

## 2017-12-13 ENCOUNTER — Other Ambulatory Visit: Payer: Self-pay | Admitting: Psychiatry

## 2017-12-24 ENCOUNTER — Telehealth: Payer: Self-pay

## 2017-12-25 ENCOUNTER — Other Ambulatory Visit: Payer: Self-pay | Admitting: Psychiatry

## 2017-12-26 ENCOUNTER — Encounter: Payer: Self-pay | Admitting: Anesthesiology

## 2017-12-26 ENCOUNTER — Encounter
Admission: RE | Admit: 2017-12-26 | Discharge: 2017-12-26 | Disposition: A | Discharge status: Home / Self Care | Payer: Medicare PPO | Source: Ambulatory Visit | Attending: Psychiatry | Admitting: Psychiatry

## 2017-12-26 DIAGNOSIS — I10 Essential (primary) hypertension: Secondary | ICD-10-CM | POA: Diagnosis not present

## 2017-12-26 DIAGNOSIS — F332 Major depressive disorder, recurrent severe without psychotic features: Secondary | ICD-10-CM

## 2017-12-26 DIAGNOSIS — F339 Major depressive disorder, recurrent, unspecified: Secondary | ICD-10-CM | POA: Diagnosis not present

## 2017-12-26 DIAGNOSIS — E1142 Type 2 diabetes mellitus with diabetic polyneuropathy: Secondary | ICD-10-CM | POA: Insufficient documentation

## 2017-12-26 DIAGNOSIS — Z888 Allergy status to other drugs, medicaments and biological substances status: Secondary | ICD-10-CM | POA: Insufficient documentation

## 2017-12-26 DIAGNOSIS — E1140 Type 2 diabetes mellitus with diabetic neuropathy, unspecified: Secondary | ICD-10-CM | POA: Diagnosis not present

## 2017-12-26 DIAGNOSIS — K219 Gastro-esophageal reflux disease without esophagitis: Secondary | ICD-10-CM | POA: Diagnosis not present

## 2017-12-26 DIAGNOSIS — F319 Bipolar disorder, unspecified: Secondary | ICD-10-CM | POA: Diagnosis not present

## 2017-12-26 DIAGNOSIS — F329 Major depressive disorder, single episode, unspecified: Secondary | ICD-10-CM | POA: Insufficient documentation

## 2017-12-26 DIAGNOSIS — E7800 Pure hypercholesterolemia, unspecified: Secondary | ICD-10-CM | POA: Diagnosis not present

## 2017-12-26 LAB — POCT PREGNANCY, URINE: Preg Test, Ur: NEGATIVE

## 2017-12-26 LAB — GLUCOSE, CAPILLARY: Glucose-Capillary: 143 mg/dL — ABNORMAL HIGH (ref 65–99)

## 2017-12-26 MED ORDER — LABETALOL HCL 5 MG/ML IV SOLN
INTRAVENOUS | Status: AC
  Filled 2017-12-26: qty 4

## 2017-12-26 MED ORDER — ESMOLOL HCL 100 MG/10ML IV SOLN
INTRAVENOUS | Status: DC | PRN
  Administered 2017-12-26: 20 mg via INTRAVENOUS

## 2017-12-26 MED ORDER — FENTANYL CITRATE (PF) 100 MCG/2ML IJ SOLN: 25.0000 ug | INTRAMUSCULAR | Status: DC | PRN

## 2017-12-26 MED ORDER — SODIUM CHLORIDE 0.9 % IV SOLN
INTRAVENOUS | Status: DC | PRN
  Administered 2017-12-26: 10:00:00 via INTRAVENOUS

## 2017-12-26 MED ORDER — GLYCOPYRROLATE 0.2 MG/ML IJ SOLN
INTRAMUSCULAR | Status: AC
  Administered 2017-12-26: 0.4 mg via INTRAVENOUS
  Filled 2017-12-26: qty 2

## 2017-12-26 MED ORDER — SODIUM CHLORIDE 0.9 % IV SOLN
500.0000 mL | Freq: Once | INTRAVENOUS | Status: AC
  Administered 2017-12-26: 500 mL via INTRAVENOUS

## 2017-12-26 MED ORDER — METHOHEXITAL SODIUM 0.5 G IJ SOLR
INTRAMUSCULAR | Status: AC
  Filled 2017-12-26: qty 500

## 2017-12-26 MED ORDER — SUCCINYLCHOLINE CHLORIDE 20 MG/ML IJ SOLN
INTRAMUSCULAR | Status: AC
  Filled 2017-12-26: qty 1

## 2017-12-26 MED ORDER — LABETALOL HCL 5 MG/ML IV SOLN
INTRAVENOUS | Status: DC | PRN
  Administered 2017-12-26: 20 mg via INTRAVENOUS

## 2017-12-26 MED ORDER — ESMOLOL HCL 100 MG/10ML IV SOLN
INTRAVENOUS | Status: AC
  Filled 2017-12-26: qty 10

## 2017-12-26 MED ORDER — GLYCOPYRROLATE 0.2 MG/ML IJ SOLN
0.4000 mg | Freq: Once | INTRAMUSCULAR | Status: AC
  Administered 2017-12-26: 0.4 mg via INTRAVENOUS

## 2017-12-26 MED ORDER — SUCCINYLCHOLINE CHLORIDE 20 MG/ML IJ SOLN
INTRAMUSCULAR | Status: DC | PRN
  Administered 2017-12-26: 100 mg via INTRAVENOUS

## 2017-12-26 MED ORDER — ONDANSETRON HCL 4 MG/2ML IJ SOLN: 4.0000 mg | Freq: Once | INTRAMUSCULAR | Status: DC | PRN

## 2017-12-26 MED ORDER — METHOHEXITAL SODIUM 100 MG/10ML IV SOSY
PREFILLED_SYRINGE | INTRAVENOUS | Status: DC | PRN
  Administered 2017-12-26: 70 mg via INTRAVENOUS

## 2017-12-26 NOTE — Anesthesia Procedure Notes (Signed)
Procedure Name: General with mask airway Date/Time: 12/26/2017 10:50 AM Performed by: Johnna Acosta, CRNA Pre-anesthesia Checklist: Patient identified, Emergency Drugs available, Suction available, Patient being monitored and Timeout performed Patient Re-evaluated:Patient Re-evaluated prior to induction Oxygen Delivery Method: Circle system utilized Preoxygenation: Pre-oxygenation with 100% oxygen Induction Type: IV induction Ventilation: Mask ventilation without difficulty

## 2017-12-26 NOTE — Procedures (Signed)
ECT SERVICES Physician's Interval Evaluation & Treatment Note  Patient Identification: Melinda [name] MRN:  [id number] Date of Evaluation:  12/26/2017 TX #: [id number]  MADRS:   MMSE:   P.E. Findings:  No change to physical exam  Psychiatric Interval Note:  She is feeling more down and sad  Subjective:  Patient is a 47 y.o. female seen for evaluation for Electroconvulsive Therapy. A little more sad and down than usual today  Treatment Summary:   []   Right Unilateral             [x]  Bilateral   % Energy : 1.0 ms 35%   Impedance: 970 ohms  Seizure Energy Index: 11,485 V squared  Postictal Suppression Index: 79%  Seizure Concordance Index: 99%  Medications  Pre Shock: Robinul 0.4 mg Toradol 30 mg Zofran 4 mg Brevital 70 mg succinylcholine 100 mg esmolol 10 mg labetalol 20 mg  Post Shock:    Seizure Duration: 46 seconds EMG 100 seconds EEG   Comments: Follow-up 2 weeks  Lungs:  [x]   Clear to auscultation               []  Other:   Heart:    [x]   Regular rhythm             []  irregular rhythm    [x]   Previous H&P reviewed, patient examined and there are NO CHANGES                 []   Previous H&P reviewed, patient examined and there are changes noted.   Melinda Berthold, MD 2/15/201910:47 AM

## 2017-12-26 NOTE — H&P (Signed)
Melinda [name] is an 47 y.o. female.   Chief Complaint: Patient continues to be depressed and in fact is feeling worse this week has had a lot of stress at home. HPI: Chronic depression not psychotic not suicidal benefits from maintenance ECT  Past Medical History:  Diagnosis Date  . Depression   . Diabetes mellitus without complication ([place])   . Diabetic peripheral neuropathy ([place]) 03/07/15  . Diabetic peripheral neuropathy ([place]) 03/07/15  . Diabetic peripheral neuropathy ([place]) 03/07/15  . GERD (gastroesophageal reflux disease)   . Hypercholesterolemia 03/07/15  . Hypertension   . Obesity 03/07/15  . Personality disorder ([place]) 03/07/15  . Sinus tachycardia 03/07/15   history of  . Suicidal thoughts 03/07/15    Past Surgical History:  Procedure Laterality Date  . electroconvulsion therapy  03/07/15    Family History  Problem Relation Age of Onset  . Hypertension Father   . Diabetes Mother    Social History:  reports that  has never smoked. she has never used smokeless tobacco. She reports that she does not drink alcohol or use drugs.  Allergies:  Allergies  Allergen Reactions  . Prednisone     Increases blood sugar     (Not in a hospital admission)  Results for orders placed or performed during the hospital encounter of 12/26/17 (from the past 48 hour(s))  Pregnancy, urine POC     Status: None   Collection Time: 12/26/17  8:21 AM  Result Value Ref Range   Preg Test, Ur NEGATIVE NEGATIVE    Comment:        THE SENSITIVITY OF THIS METHODOLOGY IS >24 mIU/mL    No results found.  Review of Systems  Constitutional: Negative.   HENT: Negative.   Eyes: Negative.   Respiratory: Negative.   Cardiovascular: Negative.   Gastrointestinal: Negative.   Musculoskeletal: Negative.   Skin: Negative.   Neurological: Negative.   Psychiatric/Behavioral: Positive for depression. Negative for hallucinations, memory loss, substance abuse and suicidal ideas. The patient is  nervous/anxious. The patient does not have insomnia.     Blood pressure (!) 160/81, temperature 97.7 F (36.5 C), temperature source Oral, resp. rate 18, height 5\' 3"  (1.6 m), weight 116.1 kg (256 lb), last menstrual period 12/19/2017, SpO2 100 %. Physical Exam  Nursing note and vitals reviewed. Constitutional: She appears well-developed and well-nourished.  HENT:  Head: Normocephalic and atraumatic.  Eyes: Conjunctivae are normal. Pupils are equal, round, and reactive to light.  Neck: Normal range of motion.  Cardiovascular: Regular rhythm and normal heart sounds.  Respiratory: Effort normal. No respiratory distress.  GI: Soft.  Musculoskeletal: Normal range of motion.  Neurological: She is alert.  Skin: Skin is warm and dry.  Psychiatric: Judgment normal. Her affect is blunt. Her speech is delayed. She is slowed. Thought content is not paranoid. Cognition and memory are normal. She exhibits a depressed mood. She expresses no suicidal ideation.     Assessment/Plan Spent some time doing supportive counseling and encouragement.  Treatment today follow-up 2 weeks.  [name], MD 12/26/2017, 10:45 AM

## 2017-12-26 NOTE — Discharge Instructions (Signed)
1)  The drugs that you have been given will stay in your system until tomorrow so for the       next 24 hours you should not:  A. Drive an automobile  B. Make any legal decisions  C. Drink any alcoholic beverages  2)  You may resume your regular meals upon return home.  3)  A responsible adult must take you home.  Someone should stay with you for a few          hours, then be available by phone for the remainder of the treatment day.  4)  You May experience any of the following symptoms:  Headache, Nausea and a dry mouth (due to the medications you were given),  temporary memory loss and some confusion, or sore muscles (a warm bath  should help this).  If you you experience any of these symptoms let us know on                your return visit.  5)  Report any of the following: any acute discomfort, severe headache, or temperature        greater than 100.5 F.   Also report any unusual redness, swelling, drainage, or pain         at your IV site.    You may report Symptoms to:  [place] at [facility]          Phone: [phone], ECT Department           or Dr. Prescott Gum office [phone]  6)  Your next ECT Treatment is Day Friday Date January 09, 2018 at 800am  We will call 2 days prior to your scheduled appointment for arrival times.  7)  Nothing to eat or drink after midnight the night before your procedure.  8)  Take      With a sip of water the morning of your procedure.  9)  Other Instructions: Call [phone] to cancel the morning of your procedure due         to illness or emergency.  10) We will call within 72 hours to assess how you are feeling.

## 2017-12-26 NOTE — Anesthesia Preprocedure Evaluation (Signed)
Anesthesia Evaluation  Patient identified by MRN, date of birth, ID band Patient awake    Reviewed: Allergy & Precautions, NPO status , Patient's Chart, lab work & pertinent test results, reviewed documented beta blocker date and time   Airway Mallampati: III  TM Distance: >3 FB     Dental  (+) Chipped   Pulmonary sleep apnea ,           Cardiovascular hypertension, Pt. on medications      Neuro/Psych PSYCHIATRIC DISORDERS Depression Bipolar Disorder  Neuromuscular disease    GI/Hepatic GERD  Controlled,  Endo/Other  diabetes, Type 2  Renal/GU      Musculoskeletal   Abdominal   Peds  Hematology   Anesthesia Other Findings   Reproductive/Obstetrics                             Anesthesia Physical Anesthesia Plan  ASA: III  Anesthesia Plan: General   Post-op Pain Management:    Induction: Intravenous  PONV Risk Score and Plan:   Airway Management Planned:   Additional Equipment:   Intra-op Plan:   Post-operative Plan:   Informed Consent: I have reviewed the patients History and Physical, chart, labs and discussed the procedure including the risks, benefits and alternatives for the proposed anesthesia with the patient or authorized representative who has indicated his/her understanding and acceptance.     Plan Discussed with: CRNA  Anesthesia Plan Comments:         Anesthesia Quick Evaluation

## 2017-12-26 NOTE — Transfer of Care (Signed)
Immediate Anesthesia Transfer of Care Note  Patient: Melinda [name]  Procedure(s) Performed: ECT TX  Patient Location: PACU  Anesthesia Type:General  Level of Consciousness: sedated  Airway & Oxygen Therapy: Patient Spontanous Breathing and Patient connected to face mask oxygen  Post-op Assessment: Report given to RN and Post -op Vital signs reviewed and stable  Post vital signs: Reviewed and stable  Last Vitals:  Vitals:   12/26/17 0826 12/26/17 1100  BP: (!) 160/81   Resp: 18   Temp: 36.5 C   SpO2: 100% (P) 98%    Last Pain:  Vitals:   12/26/17 0826  TempSrc: Oral         Complications: No apparent anesthesia complications

## 2017-12-26 NOTE — Anesthesia Post-op Follow-up Note (Signed)
Anesthesia QCDR form completed.        

## 2017-12-26 NOTE — Anesthesia Postprocedure Evaluation (Signed)
Anesthesia Post Note  Patient: Melinda [name]  Procedure(s) Performed: ECT TX  Patient location during evaluation: PACU Anesthesia Type: General Level of consciousness: awake and alert Pain management: pain level controlled Vital Signs Assessment: post-procedure vital signs reviewed and stable Respiratory status: spontaneous breathing, nonlabored ventilation, respiratory function stable and patient connected to nasal cannula oxygen Cardiovascular status: blood pressure returned to baseline and stable Postop Assessment: no apparent nausea or vomiting Anesthetic complications: no     Last Vitals:  Vitals:   12/26/17 1145 12/26/17 1157  BP: (!) 143/84 (!) 155/89  Pulse: 81 73  Resp: (!) 28 18  Temp:    SpO2: 94%     Last Pain:  Vitals:   12/26/17 1157  TempSrc:   PainSc: 0-No pain                 Eldean Nanna S

## 2018-01-01 ENCOUNTER — Other Ambulatory Visit: Payer: Self-pay | Admitting: Psychiatry

## 2018-01-07 ENCOUNTER — Telehealth: Payer: Self-pay | Admitting: *Deleted

## 2018-01-09 ENCOUNTER — Ambulatory Visit
Admission: RE | Admit: 2018-01-09 | Discharge: 2018-01-09 | Disposition: A | Discharge status: Home / Self Care | Payer: Medicare PPO | Source: Ambulatory Visit | Attending: Psychiatry | Admitting: Psychiatry

## 2018-01-09 ENCOUNTER — Encounter: Payer: Self-pay | Admitting: Registered Nurse

## 2018-01-09 DIAGNOSIS — E7800 Pure hypercholesterolemia, unspecified: Secondary | ICD-10-CM | POA: Diagnosis not present

## 2018-01-09 DIAGNOSIS — Z888 Allergy status to other drugs, medicaments and biological substances status: Secondary | ICD-10-CM | POA: Insufficient documentation

## 2018-01-09 DIAGNOSIS — E1142 Type 2 diabetes mellitus with diabetic polyneuropathy: Secondary | ICD-10-CM | POA: Diagnosis not present

## 2018-01-09 DIAGNOSIS — E669 Obesity, unspecified: Secondary | ICD-10-CM | POA: Diagnosis not present

## 2018-01-09 DIAGNOSIS — F332 Major depressive disorder, recurrent severe without psychotic features: Secondary | ICD-10-CM

## 2018-01-09 DIAGNOSIS — F319 Bipolar disorder, unspecified: Secondary | ICD-10-CM | POA: Diagnosis not present

## 2018-01-09 DIAGNOSIS — I10 Essential (primary) hypertension: Secondary | ICD-10-CM | POA: Insufficient documentation

## 2018-01-09 DIAGNOSIS — K219 Gastro-esophageal reflux disease without esophagitis: Secondary | ICD-10-CM | POA: Insufficient documentation

## 2018-01-09 DIAGNOSIS — F329 Major depressive disorder, single episode, unspecified: Secondary | ICD-10-CM | POA: Insufficient documentation

## 2018-01-09 DIAGNOSIS — F609 Personality disorder, unspecified: Secondary | ICD-10-CM | POA: Diagnosis not present

## 2018-01-09 LAB — GLUCOSE, CAPILLARY: Glucose-Capillary: 144 mg/dL — ABNORMAL HIGH (ref 65–99)

## 2018-01-09 LAB — POCT PREGNANCY, URINE: Preg Test, Ur: NEGATIVE

## 2018-01-09 MED ORDER — SODIUM CHLORIDE 0.9 % IV SOLN
500.0000 mL | Freq: Once | INTRAVENOUS | Status: AC
  Administered 2018-01-09: 500 mL via INTRAVENOUS

## 2018-01-09 MED ORDER — METHOHEXITAL SODIUM 100 MG/10ML IV SOSY
PREFILLED_SYRINGE | INTRAVENOUS | Status: DC | PRN
  Administered 2018-01-09: 70 mg via INTRAVENOUS

## 2018-01-09 MED ORDER — GLYCOPYRROLATE 0.2 MG/ML IJ SOLN
INTRAMUSCULAR | Status: AC
  Administered 2018-01-09: 0.3 mg via INTRAVENOUS
  Filled 2018-01-09: qty 2

## 2018-01-09 MED ORDER — KETOROLAC TROMETHAMINE 30 MG/ML IJ SOLN
30.0000 mg | Freq: Once | INTRAMUSCULAR | Status: AC
  Administered 2018-01-09: 30 mg via INTRAVENOUS

## 2018-01-09 MED ORDER — ONDANSETRON HCL 4 MG/2ML IJ SOLN
INTRAMUSCULAR | Status: AC
  Administered 2018-01-09: 4 mg
  Filled 2018-01-09: qty 2

## 2018-01-09 MED ORDER — SUCCINYLCHOLINE CHLORIDE 20 MG/ML IJ SOLN
INTRAMUSCULAR | Status: DC | PRN
  Administered 2018-01-09: 100 mg via INTRAVENOUS

## 2018-01-09 MED ORDER — SODIUM CHLORIDE 0.9 % IV SOLN
INTRAVENOUS | Status: DC | PRN
  Administered 2018-01-09: 10:00:00 via INTRAVENOUS

## 2018-01-09 MED ORDER — KETOROLAC TROMETHAMINE 30 MG/ML IJ SOLN
INTRAMUSCULAR | Status: AC
  Administered 2018-01-09: 30 mg via INTRAVENOUS
  Filled 2018-01-09: qty 1

## 2018-01-09 MED ORDER — LABETALOL HCL 5 MG/ML IV SOLN
INTRAVENOUS | Status: DC | PRN
  Administered 2018-01-09: 20 mg via INTRAVENOUS

## 2018-01-09 MED ORDER — ESMOLOL HCL 100 MG/10ML IV SOLN
INTRAVENOUS | Status: DC | PRN
  Administered 2018-01-09: 20 mg via INTRAVENOUS

## 2018-01-09 MED ORDER — GLYCOPYRROLATE 0.2 MG/ML IJ SOLN
0.3000 mg | Freq: Once | INTRAMUSCULAR | Status: AC
  Administered 2018-01-09: 0.3 mg via INTRAVENOUS

## 2018-01-09 NOTE — H&P (Addendum)
Melinda [name] is an 47 y.o. female.   Chief Complaint: Patient is extra sad today.  When her cat passed away last week.  Grieving as would be expected. HPI: History of long-standing depression with partial benefit and response from maintenance ECT as well as medicine  Past Medical History:  Diagnosis Date  . Depression   . Diabetes mellitus without complication ([place])   . Diabetic peripheral neuropathy ([place]) 03/07/15  . Diabetic peripheral neuropathy ([place]) 03/07/15  . Diabetic peripheral neuropathy ([place]) 03/07/15  . GERD (gastroesophageal reflux disease)   . Hypercholesterolemia 03/07/15  . Hypertension   . Obesity 03/07/15  . Personality disorder ([place]) 03/07/15  . Sinus tachycardia 03/07/15   history of  . Suicidal thoughts 03/07/15    Past Surgical History:  Procedure Laterality Date  . electroconvulsion therapy  03/07/15    Family History  Problem Relation Age of Onset  . Hypertension Father   . Diabetes Mother    Social History:  reports that  has never smoked. she has never used smokeless tobacco. She reports that she does not drink alcohol or use drugs.  Allergies:  Allergies  Allergen Reactions  . Prednisone     Increases blood sugar     (Not in a hospital admission)  Results for orders placed or performed during the hospital encounter of 01/09/18 (from the past 48 hour(s))  Pregnancy, urine POC     Status: None   Collection Time: 01/09/18  9:50 AM  Result Value Ref Range   Preg Test, Ur NEGATIVE NEGATIVE    Comment:        THE SENSITIVITY OF THIS METHODOLOGY IS >24 mIU/mL    No results found.  Review of Systems  Constitutional: Negative.   HENT: Negative.   Eyes: Negative.   Respiratory: Negative.   Cardiovascular: Negative.   Gastrointestinal: Negative.   Musculoskeletal: Negative.   Skin: Negative.   Neurological: Negative.   Psychiatric/Behavioral: Positive for depression. Negative for hallucinations, memory loss, substance abuse and suicidal  ideas. The patient has insomnia. The patient is not nervous/anxious.     Blood pressure (!) 150/90, pulse 95, temperature 97.8 F (36.6 C), temperature source Oral, height 5\' 3"  (1.6 m), weight 116.6 kg (257 lb), last menstrual period 12/19/2017. Physical Exam  Nursing note and vitals reviewed. Constitutional: She appears well-developed and well-nourished.  HENT:  Head: Normocephalic and atraumatic.  Eyes: Conjunctivae are normal. Pupils are equal, round, and reactive to light.  Neck: Normal range of motion.  Cardiovascular: Regular rhythm and normal heart sounds.  Respiratory: Effort normal. No respiratory distress.  GI: Soft.  Musculoskeletal: Normal range of motion.  Neurological: She is alert.  Skin: Skin is warm and dry.  Psychiatric: Judgment normal. Her speech is delayed. She is slowed. Cognition and memory are normal. She exhibits a depressed mood. She expresses no suicidal ideation.     Assessment/Plan Spent some time doing some supportive therapy counseling and time listening to her arm.  No change to medicine.  I recently renewed some of her prescriptions.  ECT today and follow up again in 2 weeks.  [name], MD 01/09/2018, 10:17 AM

## 2018-01-09 NOTE — Anesthesia Procedure Notes (Signed)
Procedure Name: General with mask airway Performed by: Hedda Slade, CRNA Pre-anesthesia Checklist: Patient identified, Emergency Drugs available, Suction available, Patient being monitored and Timeout performed Patient Re-evaluated:Patient Re-evaluated prior to induction Oxygen Delivery Method: Circle system utilized Preoxygenation: Pre-oxygenation with 100% oxygen Induction Type: IV induction Ventilation: Mask ventilation without difficulty Airway Equipment and Method: Bite block

## 2018-01-09 NOTE — Anesthesia Post-op Follow-up Note (Signed)
Anesthesia QCDR form completed.        

## 2018-01-09 NOTE — Discharge Instructions (Signed)
1)  The drugs that you have been given will stay in your system until tomorrow so for the       next 24 hours you should not:  A. Drive an automobile  B. Make any legal decisions  C. Drink any alcoholic beverages  2)  You may resume your regular meals upon return home.  3)  A responsible adult must take you home.  Someone should stay with you for a few          hours, then be available by phone for the remainder of the treatment day.  4)  You May experience any of the following symptoms:  Headache, Nausea and a dry mouth (due to the medications you were given),  temporary memory loss and some confusion, or sore muscles (a warm bath  should help this).  If you you experience any of these symptoms let us know on                your return visit.  5)  Report any of the following: any acute discomfort, severe headache, or temperature        greater than 100.5 F.   Also report any unusual redness, swelling, drainage, or pain         at your IV site.    You may report Symptoms to:  [place] at [facility]          Phone: [phone], ECT Department           or Dr. Prescott Gum office [phone]  6)  Your next ECT Treatment is Friday March 15   We will call 2 days prior to your scheduled appointment for arrival times.  7)  Nothing to eat or drink after midnight the night before your procedure.  8)  Take     With a sip of water the morning of your procedure.  9)  Other Instructions: Call [phone] to cancel the morning of your procedure due         to illness or emergency.  10) We will call within 72 hours to assess how you are feeling.

## 2018-01-09 NOTE — Progress Notes (Signed)
Very restless

## 2018-01-09 NOTE — Transfer of Care (Signed)
Immediate Anesthesia Transfer of Care Note  Patient: Melinda [name]  Procedure(s) Performed: ECT TX  Patient Location: PACU  Anesthesia Type:General  Level of Consciousness: awake  Airway & Oxygen Therapy: Patient connected to face mask oxygen  Post-op Assessment: Post -op Vital signs reviewed and stable  Post vital signs: stable  Last Vitals:  Vitals:   01/09/18 0844  BP: (!) 150/90  Pulse: 95  Temp: 36.6 C    Last Pain:  Vitals:   01/09/18 0844  TempSrc: Oral  PainSc: 0-No pain         Complications: No apparent anesthesia complications

## 2018-01-09 NOTE — Anesthesia Postprocedure Evaluation (Signed)
Anesthesia Post Note  Patient: [name]  Procedure(s) Performed: ECT TX  Patient location during evaluation: PACU Anesthesia Type: General Level of consciousness: awake and alert Pain management: pain level controlled Vital Signs Assessment: post-procedure vital signs reviewed and stable Respiratory status: spontaneous breathing, nonlabored ventilation, respiratory function stable and patient connected to nasal cannula oxygen Cardiovascular status: blood pressure returned to baseline and stable Postop Assessment: no apparent nausea or vomiting Anesthetic complications: no     Last Vitals:  Vitals:   01/09/18 1035 01/09/18 1044  BP: 129/89 130/64  Pulse: (!) 108 96  Resp: (!) 32 13  Temp: 37.3 C   SpO2: 94% 93%    Last Pain:  Vitals:   01/09/18 0844  TempSrc: Oral  PainSc: 0-No pain                 Molli Barrows

## 2018-01-09 NOTE — Procedures (Signed)
ECT SERVICES Physician's Interval Evaluation & Treatment Note  Patient Identification: Melinda [name] MRN:  [id number] Date of Evaluation:  01/09/2018 TX #: [id number]  MADRS:   MMSE:   P.E. Findings:  No change to physical exam.  Vitals unremarkable heart and lungs normal no new injuries.  Psychiatric Interval Note:  Sad appropriate grieving not actively suicidal no sign of psychosis.  Subjective:  Patient is a 47 y.o. female seen for evaluation for Electroconvulsive Therapy. Feeling sad  Treatment Summary:   []   Right Unilateral             []  Bilateral   % Energy : 1.0 ms 35%   Impedance: 1160 ohms  Seizure Energy Index: 8416 V squared  Postictal Suppression Index: 90%  Seizure Concordance Index: 98%  Medications  Pre Shock: Robinul 0.3 mg Toradol 30 mg labetalol 20 mg esmolol 20 mg Brevital 70 mg succinylcholine 100 mg  Post Shock:    Seizure Duration: 41 seconds by EMG 93 seconds by EEG   Comments: Follow-up 2 weeks  Lungs:  [x]   Clear to auscultation               []  Other:   Heart:    [x]   Regular rhythm             []  irregular rhythm    [x]   Previous H&P reviewed, patient examined and there are NO CHANGES                 []   Previous H&P reviewed, patient examined and there are changes noted.   [name], MD 3/1/201910:19 AM

## 2018-01-09 NOTE — Anesthesia Preprocedure Evaluation (Signed)
Anesthesia Evaluation  Patient identified by MRN, date of birth, ID band Patient awake    Reviewed: Allergy & Precautions, H&P , NPO status , Patient's Chart, lab work & pertinent test results, reviewed documented beta blocker date and time   Airway Mallampati: II   Neck ROM: full    Dental  (+) Poor Dentition   Pulmonary neg pulmonary ROS, sleep apnea ,    Pulmonary exam normal        Cardiovascular Exercise Tolerance: Poor hypertension, On Medications negative cardio ROS Normal cardiovascular exam Rhythm:regular Rate:Normal     Neuro/Psych PSYCHIATRIC DISORDERS Depression Bipolar Disorder  Neuromuscular disease negative neurological ROS  negative psych ROS   GI/Hepatic negative GI ROS, Neg liver ROS, GERD  Medicated,  Endo/Other  negative endocrine ROSdiabetes  Renal/GU negative Renal ROS  negative genitourinary   Musculoskeletal   Abdominal   Peds  Hematology negative hematology ROS (+)   Anesthesia Other Findings Past Medical History: No date: Depression No date: Diabetes mellitus without complication ([place]) 8/75/79: Diabetic peripheral neuropathy ([place]) 03/07/15: Diabetic peripheral neuropathy ([place]) 03/07/15: Diabetic peripheral neuropathy ([facility]) No date: GERD (gastroesophageal reflux disease) 03/07/15: Hypercholesterolemia No date: Hypertension 03/07/15: Obesity 03/07/15: Personality disorder ([place]) 03/07/15: Sinus tachycardia     Comment:  history of 03/07/15: Suicidal thoughts Past Surgical History: 03/07/15: electroconvulsion therapy BMI    Body Mass Index:  45.53 kg/m     Reproductive/Obstetrics negative OB ROS                             Anesthesia Physical Anesthesia Plan  ASA: III  Anesthesia Plan: General   Post-op Pain Management:    Induction:   PONV Risk Score and Plan:   Airway Management Planned:   Additional Equipment:   Intra-op Plan:   Post-operative  Plan:   Informed Consent: I have reviewed the patients History and Physical, chart, labs and discussed the procedure including the risks, benefits and alternatives for the proposed anesthesia with the patient or authorized representative who has indicated his/her understanding and acceptance.   Dental Advisory Given  Plan Discussed with: CRNA  Anesthesia Plan Comments:         Anesthesia Quick Evaluation

## 2018-01-21 ENCOUNTER — Telehealth: Payer: Self-pay

## 2018-01-22 ENCOUNTER — Other Ambulatory Visit: Payer: Self-pay | Admitting: Psychiatry

## 2018-01-23 ENCOUNTER — Encounter: Payer: Self-pay | Admitting: Anesthesiology

## 2018-01-23 ENCOUNTER — Encounter
Admission: RE | Admit: 2018-01-23 | Discharge: 2018-01-23 | Disposition: A | Discharge status: Home / Self Care | Payer: Medicare PPO | Source: Ambulatory Visit | Attending: Psychiatry | Admitting: Psychiatry

## 2018-01-23 DIAGNOSIS — Z833 Family history of diabetes mellitus: Secondary | ICD-10-CM | POA: Diagnosis not present

## 2018-01-23 DIAGNOSIS — Z8249 Family history of ischemic heart disease and other diseases of the circulatory system: Secondary | ICD-10-CM | POA: Insufficient documentation

## 2018-01-23 DIAGNOSIS — E669 Obesity, unspecified: Secondary | ICD-10-CM | POA: Diagnosis not present

## 2018-01-23 DIAGNOSIS — F329 Major depressive disorder, single episode, unspecified: Secondary | ICD-10-CM | POA: Diagnosis not present

## 2018-01-23 DIAGNOSIS — Z888 Allergy status to other drugs, medicaments and biological substances status: Secondary | ICD-10-CM | POA: Insufficient documentation

## 2018-01-23 DIAGNOSIS — F332 Major depressive disorder, recurrent severe without psychotic features: Secondary | ICD-10-CM | POA: Diagnosis not present

## 2018-01-23 DIAGNOSIS — G4730 Sleep apnea, unspecified: Secondary | ICD-10-CM | POA: Diagnosis not present

## 2018-01-23 DIAGNOSIS — I10 Essential (primary) hypertension: Secondary | ICD-10-CM | POA: Diagnosis not present

## 2018-01-23 DIAGNOSIS — Z6841 Body Mass Index (BMI) 40.0 and over, adult: Secondary | ICD-10-CM | POA: Insufficient documentation

## 2018-01-23 DIAGNOSIS — K219 Gastro-esophageal reflux disease without esophagitis: Secondary | ICD-10-CM | POA: Diagnosis not present

## 2018-01-23 DIAGNOSIS — F609 Personality disorder, unspecified: Secondary | ICD-10-CM | POA: Insufficient documentation

## 2018-01-23 DIAGNOSIS — F319 Bipolar disorder, unspecified: Secondary | ICD-10-CM | POA: Diagnosis not present

## 2018-01-23 DIAGNOSIS — F341 Dysthymic disorder: Secondary | ICD-10-CM | POA: Insufficient documentation

## 2018-01-23 DIAGNOSIS — E7800 Pure hypercholesterolemia, unspecified: Secondary | ICD-10-CM | POA: Insufficient documentation

## 2018-01-23 DIAGNOSIS — E1142 Type 2 diabetes mellitus with diabetic polyneuropathy: Secondary | ICD-10-CM | POA: Diagnosis not present

## 2018-01-23 DIAGNOSIS — E1140 Type 2 diabetes mellitus with diabetic neuropathy, unspecified: Secondary | ICD-10-CM | POA: Diagnosis not present

## 2018-01-23 LAB — GLUCOSE, CAPILLARY: Glucose-Capillary: 117 mg/dL — ABNORMAL HIGH (ref 65–99)

## 2018-01-23 LAB — POCT PREGNANCY, URINE: Preg Test, Ur: NEGATIVE

## 2018-01-23 MED ORDER — GLYCOPYRROLATE 0.2 MG/ML IJ SOLN
INTRAMUSCULAR | Status: AC
  Filled 2018-01-23: qty 2

## 2018-01-23 MED ORDER — GLYCOPYRROLATE 0.2 MG/ML IJ SOLN
0.4000 mg | Freq: Once | INTRAMUSCULAR | Status: AC
  Administered 2018-01-23: 0.4 mg via INTRAVENOUS

## 2018-01-23 MED ORDER — LABETALOL HCL 5 MG/ML IV SOLN
INTRAVENOUS | Status: AC
Start: 2018-01-23 — End: 2018-01-23
  Filled 2018-01-23: qty 4

## 2018-01-23 MED ORDER — METHOHEXITAL SODIUM 100 MG/10ML IV SOSY
PREFILLED_SYRINGE | INTRAVENOUS | Status: DC | PRN
  Administered 2018-01-23: 70 mg via INTRAVENOUS

## 2018-01-23 MED ORDER — SODIUM CHLORIDE 0.9 % IV SOLN
500.0000 mL | Freq: Once | INTRAVENOUS | Status: AC
  Administered 2018-01-23: 500 mL via INTRAVENOUS

## 2018-01-23 MED ORDER — KETOROLAC TROMETHAMINE 30 MG/ML IJ SOLN
30.0000 mg | Freq: Once | INTRAMUSCULAR | Status: AC
  Administered 2018-01-23: 30 mg via INTRAVENOUS

## 2018-01-23 MED ORDER — LABETALOL HCL 5 MG/ML IV SOLN
INTRAVENOUS | Status: DC | PRN
  Administered 2018-01-23: 20 mg via INTRAVENOUS

## 2018-01-23 MED ORDER — SUCCINYLCHOLINE CHLORIDE 20 MG/ML IJ SOLN
INTRAMUSCULAR | Status: DC | PRN
  Administered 2018-01-23: 100 mg via INTRAVENOUS

## 2018-01-23 MED ORDER — METHOHEXITAL SODIUM 0.5 G IJ SOLR
INTRAMUSCULAR | Status: AC
  Filled 2018-01-23: qty 500

## 2018-01-23 MED ORDER — SODIUM CHLORIDE 0.9 % IV SOLN
INTRAVENOUS | Status: DC | PRN
  Administered 2018-01-23: 11:00:00 via INTRAVENOUS

## 2018-01-23 MED ORDER — ESMOLOL HCL 100 MG/10ML IV SOLN
INTRAVENOUS | Status: AC
Start: 2018-01-23 — End: 2018-01-23
  Filled 2018-01-23: qty 10

## 2018-01-23 MED ORDER — KETOROLAC TROMETHAMINE 30 MG/ML IJ SOLN
INTRAMUSCULAR | Status: AC
Start: 2018-01-23 — End: 2018-01-23
  Filled 2018-01-23: qty 1

## 2018-01-23 MED ORDER — METHOHEXITAL SODIUM 0.5 G IJ SOLR
INTRAMUSCULAR | Status: AC
Start: 2018-01-23 — End: 2018-01-23
  Filled 2018-01-23: qty 500

## 2018-01-23 MED ORDER — ESMOLOL HCL 100 MG/10ML IV SOLN
INTRAVENOUS | Status: DC | PRN
  Administered 2018-01-23: 20 mg via INTRAVENOUS

## 2018-01-23 NOTE — Transfer of Care (Signed)
Immediate Anesthesia Transfer of Care Note  Patient: Melinda [name]  Procedure(s) Performed: ECT TX  Patient Location: PACU  Anesthesia Type:General  Level of Consciousness: awake  Airway & Oxygen Therapy: Patient connected to face mask oxygen  Post-op Assessment: Post -op Vital signs reviewed and stable  Post vital signs: stable  Last Vitals:  Vitals:   01/23/18 0821 01/23/18 1124  BP: (!) 152/96 (!) 145/132  Pulse: (!) 105 (!) 106  Resp: 16 (!) 27  Temp: (!) 36.3 C   SpO2: 98% 98%    Last Pain:  Vitals:   01/23/18 1124  TempSrc: Tympanic         Complications: No apparent anesthesia complications

## 2018-01-23 NOTE — H&P (Signed)
Melinda [name] is an 47 y.o. female.   Chief Complaint: Patient continues to have depression although probably better than last time we spoke. HPI: Severe recurrent chronic depression  Past Medical History:  Diagnosis Date  . Depression   . Diabetes mellitus without complication ([place])   . Diabetic peripheral neuropathy ([place]) 03/07/15  . Diabetic peripheral neuropathy ([place]) 03/07/15  . Diabetic peripheral neuropathy ([place]) 03/07/15  . GERD (gastroesophageal reflux disease)   . Hypercholesterolemia 03/07/15  . Hypertension   . Obesity 03/07/15  . Personality disorder ([place]) 03/07/15  . Sinus tachycardia 03/07/15   history of  . Suicidal thoughts 03/07/15    Past Surgical History:  Procedure Laterality Date  . electroconvulsion therapy  03/07/15    Family History  Problem Relation Age of Onset  . Hypertension Father   . Diabetes Mother    Social History:  reports that  has never smoked. she has never used smokeless tobacco. She reports that she does not drink alcohol or use drugs.  Allergies:  Allergies  Allergen Reactions  . Prednisone     Increases blood sugar     (Not in a hospital admission)  Results for orders placed or performed during the hospital encounter of 01/23/18 (from the past 48 hour(s))  Glucose, capillary     Status: Abnormal   Collection Time: 01/23/18  8:30 AM  Result Value Ref Range   Glucose-Capillary 117 (H) 65 - 99 mg/dL  Pregnancy, urine POC     Status: None   Collection Time: 01/23/18  8:40 AM  Result Value Ref Range   Preg Test, Ur NEGATIVE NEGATIVE    Comment:        THE SENSITIVITY OF THIS METHODOLOGY IS >24 mIU/mL    No results found.  Review of Systems  Constitutional: Negative.   HENT: Negative.   Eyes: Negative.   Respiratory: Negative.   Cardiovascular: Negative.   Gastrointestinal: Negative.   Musculoskeletal: Negative.   Skin: Negative.   Neurological: Negative.   Psychiatric/Behavioral: Positive for depression. Negative for  hallucinations, memory loss, substance abuse and suicidal ideas. The patient has insomnia. The patient is not nervous/anxious.     Blood pressure (!) 152/96, pulse (!) 105, temperature (!) 97.4 F (36.3 C), temperature source Oral, resp. rate 16, height 5\' 3"  (1.6 m), weight 118.4 kg (261 lb), SpO2 98 %. Physical Exam  Nursing note and vitals reviewed. Constitutional: She appears well-developed and well-nourished.  HENT:  Head: Normocephalic and atraumatic.  Eyes: Conjunctivae are normal. Pupils are equal, round, and reactive to light.  Neck: Normal range of motion.  Cardiovascular: Regular rhythm and normal heart sounds.  Respiratory: Effort normal. No respiratory distress.  GI: Soft.  Musculoskeletal: Normal range of motion.  Neurological: She is alert.  Skin: Skin is warm and dry.  Psychiatric: Judgment normal. Her affect is blunt. Her speech is delayed. She is slowed. Thought content is not paranoid. Cognition and memory are normal. She exhibits a depressed mood. She expresses no homicidal and no suicidal ideation.     Assessment/Plan Benefiting from maintenance ECT follow-up 3 weeks  [name], MD 01/23/2018, 10:58 AM

## 2018-01-23 NOTE — Discharge Instructions (Signed)
1)  The drugs that you have been given will stay in your system until tomorrow so for the       next 24 hours you should not:  A. Drive an automobile  B. Make any legal decisions  C. Drink any alcoholic beverages  2)  You may resume your regular meals upon return home.  3)  A responsible adult must take you home.  Someone should stay with you for a few          hours, then be available by phone for the remainder of the treatment day.  4)  You May experience any of the following symptoms:  Headache, Nausea and a dry mouth (due to the medications you were given),  temporary memory loss and some confusion, or sore muscles (a warm bath  should help this).  If you you experience any of these symptoms let us know on                your return visit.  5)  Report any of the following: any acute discomfort, severe headache, or temperature        greater than 100.5 F.   Also report any unusual redness, swelling, drainage, or pain         at your IV site.    You may report Symptoms to:  [place] at [facility]          Phone: [phone], ECT Department           or Dr. Prescott Gum office [phone]  6)  Your next ECT Treatment is Friday April 5  We will call 2 days prior to your scheduled appointment for arrival times.  7)  Nothing to eat or drink after midnight the night before your procedure.  8)  Take     With a sip of water the morning of your procedure.  9)  Other Instructions: Call [phone] to cancel the morning of your procedure due         to illness or emergency.  10) We will call within 72 hours to assess how you are feeling.

## 2018-01-23 NOTE — Procedures (Signed)
ECT SERVICES Physician's Interval Evaluation & Treatment Note  Patient Identification: Melinda [name] MRN:  [id number] Date of Evaluation:  01/23/2018 TX #: [id number]  MADRS:   MMSE:   P.E. Findings:  No change to physical exam.  Heart and lungs normal.  Vitals unremarkable  Psychiatric Interval Note:  Continues to be depressed as usual but better than last time  Subjective:  Patient is a 47 y.o. female seen for evaluation for Electroconvulsive Therapy. Chronic dysthymia  Treatment Summary:   []   Right Unilateral             [x]  Bilateral   % Energy : 1.0 ms 35%   Impedance: 1070 ohms  Seizure Energy Index: 9278 V squared  Postictal Suppression Index: 88%  Seizure Concordance Index: 96%  Medications  Pre Shock: Robinul 0.3 mg Toradol 30 mg labetalol 20 mg esmolol 20 mg Brevital 70 mg succinylcholine 100 mg  Post Shock:    Seizure Duration: 46 seconds by EMG 110 seconds by EEG   Comments: Follow-up 3 weeks  Lungs:  [x]   Clear to auscultation               []  Other:   Heart:    [x]   Regular rhythm             []  irregular rhythm    [x]   Previous H&P reviewed, patient examined and there are NO CHANGES                 []   Previous H&P reviewed, patient examined and there are changes noted.   [name], MD 3/15/201911:00 AM

## 2018-01-23 NOTE — Anesthesia Post-op Follow-up Note (Signed)
Anesthesia QCDR form completed.        

## 2018-01-23 NOTE — Anesthesia Preprocedure Evaluation (Signed)
Anesthesia Evaluation  Patient identified by MRN, date of birth, ID band Patient awake    Reviewed: Allergy & Precautions, H&P , NPO status , Patient's Chart, lab work & pertinent test results, reviewed documented beta blocker date and time   Airway Mallampati: II   Neck ROM: full    Dental  (+) Poor Dentition   Pulmonary neg pulmonary ROS, sleep apnea ,    Pulmonary exam normal        Cardiovascular Exercise Tolerance: Poor hypertension, On Medications negative cardio ROS Normal cardiovascular exam Rhythm:regular Rate:Normal     Neuro/Psych PSYCHIATRIC DISORDERS Depression Bipolar Disorder  Neuromuscular disease negative neurological ROS  negative psych ROS   GI/Hepatic negative GI ROS, Neg liver ROS, GERD  Medicated,  Endo/Other  negative endocrine ROSdiabetes  Renal/GU negative Renal ROS  negative genitourinary   Musculoskeletal   Abdominal   Peds  Hematology negative hematology ROS (+)   Anesthesia Other Findings Past Medical History: No date: Depression No date: Diabetes mellitus without complication ([place]) 8/33/82: Diabetic peripheral neuropathy ([place]) 03/07/15: Diabetic peripheral neuropathy ([place]) 03/07/15: Diabetic peripheral neuropathy ([facility]) No date: GERD (gastroesophageal reflux disease) 03/07/15: Hypercholesterolemia No date: Hypertension 03/07/15: Obesity 03/07/15: Personality disorder ([place]) 03/07/15: Sinus tachycardia     Comment:  history of 03/07/15: Suicidal thoughts Past Surgical History: 03/07/15: electroconvulsion therapy BMI    Body Mass Index:  45.53 kg/m     Reproductive/Obstetrics negative OB ROS                             Anesthesia Physical  Anesthesia Plan  ASA: III  Anesthesia Plan: General   Post-op Pain Management:    Induction:   PONV Risk Score and Plan:   Airway Management Planned:   Additional Equipment:   Intra-op Plan:    Post-operative Plan:   Informed Consent: I have reviewed the patients History and Physical, chart, labs and discussed the procedure including the risks, benefits and alternatives for the proposed anesthesia with the patient or authorized representative who has indicated his/her understanding and acceptance.   Dental Advisory Given  Plan Discussed with: CRNA  Anesthesia Plan Comments:         Anesthesia Quick Evaluation

## 2018-01-23 NOTE — Anesthesia Postprocedure Evaluation (Signed)
Anesthesia Post Note  Patient: Deepa M Sanzo  Procedure(s) Performed: ECT TX  Patient location during evaluation: PACU Anesthesia Type: General Level of consciousness: awake and alert Pain management: pain level controlled Vital Signs Assessment: post-procedure vital signs reviewed and stable Respiratory status: spontaneous breathing, nonlabored ventilation, respiratory function stable and patient connected to nasal cannula oxygen Cardiovascular status: blood pressure returned to baseline and stable Postop Assessment: no apparent nausea or vomiting Anesthetic complications: no     Last Vitals:  Vitals:   01/23/18 1156 01/23/18 1214  BP:  132/79  Pulse: 89 90  Resp:  18  Temp:  37.3 C  SpO2: 97%     Last Pain:  Vitals:   01/23/18 1214  TempSrc: Oral                 Martha Clan

## 2018-01-23 NOTE — Anesthesia Procedure Notes (Signed)
Procedure Name: General with mask airway Date/Time: 01/23/2018 11:03 AM Performed by: Aline Brochure, CRNA Pre-anesthesia Checklist: Patient identified, Emergency Drugs available, Suction available, Patient being monitored and Timeout performed Patient Re-evaluated:Patient Re-evaluated prior to induction Oxygen Delivery Method: Circle system utilized Preoxygenation: Pre-oxygenation with 100% oxygen Induction Type: IV induction Ventilation: Mask ventilation without difficulty Airway Equipment and Method: Bite block

## 2018-02-03 ENCOUNTER — Other Ambulatory Visit: Payer: Self-pay | Admitting: Psychiatry

## 2018-02-11 ENCOUNTER — Telehealth: Payer: Self-pay

## 2018-02-12 ENCOUNTER — Other Ambulatory Visit: Payer: Self-pay | Admitting: Psychiatry

## 2018-02-12 NOTE — Telephone Encounter (Signed)
complete

## 2018-02-13 ENCOUNTER — Encounter: Payer: Self-pay | Admitting: Registered Nurse

## 2018-02-13 ENCOUNTER — Encounter
Admission: RE | Admit: 2018-02-13 | Discharge: 2018-02-13 | Disposition: A | Discharge status: Home / Self Care | Payer: Medicare PPO | Source: Ambulatory Visit | Attending: Psychiatry | Admitting: Psychiatry

## 2018-02-13 DIAGNOSIS — F329 Major depressive disorder, single episode, unspecified: Secondary | ICD-10-CM | POA: Diagnosis not present

## 2018-02-13 DIAGNOSIS — F332 Major depressive disorder, recurrent severe without psychotic features: Secondary | ICD-10-CM | POA: Diagnosis not present

## 2018-02-13 DIAGNOSIS — I10 Essential (primary) hypertension: Secondary | ICD-10-CM | POA: Insufficient documentation

## 2018-02-13 DIAGNOSIS — F339 Major depressive disorder, recurrent, unspecified: Secondary | ICD-10-CM | POA: Diagnosis not present

## 2018-02-13 DIAGNOSIS — F418 Other specified anxiety disorders: Secondary | ICD-10-CM | POA: Diagnosis not present

## 2018-02-13 DIAGNOSIS — E1140 Type 2 diabetes mellitus with diabetic neuropathy, unspecified: Secondary | ICD-10-CM | POA: Diagnosis not present

## 2018-02-13 DIAGNOSIS — E1142 Type 2 diabetes mellitus with diabetic polyneuropathy: Secondary | ICD-10-CM | POA: Insufficient documentation

## 2018-02-13 DIAGNOSIS — G4730 Sleep apnea, unspecified: Secondary | ICD-10-CM | POA: Diagnosis not present

## 2018-02-13 LAB — POCT PREGNANCY, URINE: Preg Test, Ur: NEGATIVE

## 2018-02-13 LAB — GLUCOSE, CAPILLARY: Glucose-Capillary: 137 mg/dL — ABNORMAL HIGH (ref 65–99)

## 2018-02-13 MED ORDER — GLYCOPYRROLATE 0.2 MG/ML IJ SOLN
0.4000 mg | Freq: Once | INTRAMUSCULAR | Status: AC
  Administered 2018-02-13: 0.4 mg via INTRAVENOUS

## 2018-02-13 MED ORDER — KETOROLAC TROMETHAMINE 30 MG/ML IJ SOLN
INTRAMUSCULAR | Status: AC
  Filled 2018-02-13: qty 1

## 2018-02-13 MED ORDER — ONDANSETRON HCL 4 MG/2ML IJ SOLN: 4.0000 mg | Freq: Once | INTRAMUSCULAR | Status: DC | PRN

## 2018-02-13 MED ORDER — SODIUM CHLORIDE 0.9 % IV SOLN
INTRAVENOUS | Status: DC | PRN
  Administered 2018-02-13: 10:00:00 via INTRAVENOUS

## 2018-02-13 MED ORDER — SUCCINYLCHOLINE CHLORIDE 20 MG/ML IJ SOLN
INTRAMUSCULAR | Status: DC | PRN
  Administered 2018-02-13: 100 mg via INTRAVENOUS

## 2018-02-13 MED ORDER — LABETALOL HCL 5 MG/ML IV SOLN
INTRAVENOUS | Status: DC | PRN
  Administered 2018-02-13: 20 mg via INTRAVENOUS

## 2018-02-13 MED ORDER — FENTANYL CITRATE (PF) 100 MCG/2ML IJ SOLN: 25.0000 ug | INTRAMUSCULAR | Status: DC | PRN

## 2018-02-13 MED ORDER — GLYCOPYRROLATE 0.2 MG/ML IJ SOLN
INTRAMUSCULAR | Status: AC
  Filled 2018-02-13: qty 2

## 2018-02-13 MED ORDER — KETOROLAC TROMETHAMINE 30 MG/ML IJ SOLN: 30.0000 mg | Freq: Once | INTRAMUSCULAR | Status: AC

## 2018-02-13 MED ORDER — ESMOLOL HCL 100 MG/10ML IV SOLN
INTRAVENOUS | Status: DC | PRN
  Administered 2018-02-13: 20 mg via INTRAVENOUS

## 2018-02-13 MED ORDER — METHOHEXITAL SODIUM 100 MG/10ML IV SOSY
PREFILLED_SYRINGE | INTRAVENOUS | Status: DC | PRN
  Administered 2018-02-13: 70 mg via INTRAVENOUS

## 2018-02-13 MED ORDER — SODIUM CHLORIDE 0.9 % IV SOLN
500.0000 mL | Freq: Once | INTRAVENOUS | Status: AC
  Administered 2018-02-13: 500 mL via INTRAVENOUS

## 2018-02-13 NOTE — Anesthesia Postprocedure Evaluation (Signed)
Anesthesia Post Note  Patient: Melinda [name]  Procedure(s) Performed: ECT TX  Patient location during evaluation: PACU Anesthesia Type: General Level of consciousness: awake and alert and oriented Pain management: pain level controlled Vital Signs Assessment: post-procedure vital signs reviewed and stable Respiratory status: spontaneous breathing Cardiovascular status: blood pressure returned to baseline Anesthetic complications: no     Last Vitals:  Vitals:   02/13/18 1119 02/13/18 1126  BP: 120/73 115/79  Pulse: 88 88  Resp: (!) 28 16  Temp:  37.3 C  SpO2: 90%     Last Pain:  Vitals:   02/13/18 1126  TempSrc: Oral  PainSc:                  Jacee Enerson

## 2018-02-13 NOTE — Anesthesia Preprocedure Evaluation (Signed)
Anesthesia Evaluation  Patient identified by MRN, date of birth, ID band Patient awake    Reviewed: Allergy & Precautions, H&P , NPO status , Patient's Chart, lab work & pertinent test results, reviewed documented beta blocker date and time   Airway Mallampati: II   Neck ROM: full    Dental  (+) Poor Dentition   Pulmonary neg pulmonary ROS, sleep apnea ,    Pulmonary exam normal        Cardiovascular Exercise Tolerance: Poor hypertension, On Medications negative cardio ROS Normal cardiovascular exam Rhythm:regular Rate:Normal     Neuro/Psych PSYCHIATRIC DISORDERS Depression Bipolar Disorder  Neuromuscular disease negative neurological ROS  negative psych ROS   GI/Hepatic negative GI ROS, Neg liver ROS, GERD  Medicated,  Endo/Other  negative endocrine ROSdiabetes  Renal/GU negative Renal ROS  negative genitourinary   Musculoskeletal   Abdominal   Peds  Hematology negative hematology ROS (+)   Anesthesia Other Findings Past Medical History: No date: Depression No date: Diabetes mellitus without complication ([place]) 9/39/03: Diabetic peripheral neuropathy ([place]) 03/07/15: Diabetic peripheral neuropathy ([place]) 03/07/15: Diabetic peripheral neuropathy ([facility]) No date: GERD (gastroesophageal reflux disease) 03/07/15: Hypercholesterolemia No date: Hypertension 03/07/15: Obesity 03/07/15: Personality disorder ([place]) 03/07/15: Sinus tachycardia     Comment:  history of 03/07/15: Suicidal thoughts Past Surgical History: 03/07/15: electroconvulsion therapy BMI    Body Mass Index:  45.53 kg/m     Reproductive/Obstetrics negative OB ROS                             Anesthesia Physical  Anesthesia Plan  ASA: III  Anesthesia Plan: General   Post-op Pain Management:    Induction:   PONV Risk Score and Plan:   Airway Management Planned:   Additional Equipment:   Intra-op Plan:    Post-operative Plan:   Informed Consent: I have reviewed the patients History and Physical, chart, labs and discussed the procedure including the risks, benefits and alternatives for the proposed anesthesia with the patient or authorized representative who has indicated his/her understanding and acceptance.   Dental Advisory Given  Plan Discussed with: CRNA  Anesthesia Plan Comments:         Anesthesia Quick Evaluation

## 2018-02-13 NOTE — Anesthesia Post-op Follow-up Note (Signed)
Anesthesia QCDR form completed.        

## 2018-02-13 NOTE — Procedures (Signed)
ECT SERVICES Physician's Interval Evaluation & Treatment Note  Patient Identification: Melinda [name] MRN:  [id number] Date of Evaluation:  02/13/2018 TX #: [id number]  MADRS:   MMSE:   P.E. Findings:  No change to physical exam  Psychiatric Interval Note:  Still depressed down negative  Subjective:  Patient is a 47 y.o. female seen for evaluation for Electroconvulsive Therapy. Depression no acute suicidal intent  Treatment Summary:   []   Right Unilateral             [x]  Bilateral   % Energy : 1.0 ms 35%   Impedance: 1240 ohms  Seizure Energy Index: 9619 V squared  Postictal Suppression Index: 84%  Seizure Concordance Index: 95%  Medications  Pre Shock: Robinul 0.3 mg Toradol 30 mg labetalol 20 mg esmolol 10 mg Brevital 70 mg succinylcholine 100 mg  Post Shock:    Seizure Duration: 45 seconds EMG 92 seconds EEG   Comments: Follow-up 2 weeks  Lungs:  [x]   Clear to auscultation               []  Other:   Heart:    [x]   Regular rhythm             []  irregular rhythm    [x]   Previous H&P reviewed, patient examined and there are NO CHANGES                 []   Previous H&P reviewed, patient examined and there are changes noted.   [name], MD 4/5/201910:36 AM

## 2018-02-13 NOTE — Anesthesia Procedure Notes (Signed)
Performed by: Geovani Tootle, CRNA Pre-anesthesia Checklist: Patient identified, Emergency Drugs available, Suction available and Patient being monitored Patient Re-evaluated:Patient Re-evaluated prior to induction Oxygen Delivery Method: Circle system utilized Preoxygenation: Pre-oxygenation with 100% oxygen Induction Type: IV induction Ventilation: Mask ventilation without difficulty and Mask ventilation throughout procedure Airway Equipment and Method: Bite block Placement Confirmation: positive ETCO2 Dental Injury: Teeth and Oropharynx as per pre-operative assessment        

## 2018-02-13 NOTE — H&P (Signed)
Melinda [name] is an 47 y.o. female.   Chief Complaint: Continues to be very depressed and negative focused on pain and negativity. HPI: History of long-standing chronic depression some benefit from chronic ECT  Past Medical History:  Diagnosis Date  . Depression   . Diabetes mellitus without complication ([place])   . Diabetic peripheral neuropathy ([place]) 03/07/15  . Diabetic peripheral neuropathy ([place]) 03/07/15  . Diabetic peripheral neuropathy ([place]) 03/07/15  . GERD (gastroesophageal reflux disease)   . Hypercholesterolemia 03/07/15  . Hypertension   . Obesity 03/07/15  . Personality disorder ([place]) 03/07/15  . Sinus tachycardia 03/07/15   history of  . Suicidal thoughts 03/07/15    Past Surgical History:  Procedure Laterality Date  . electroconvulsion therapy  03/07/15    Family History  Problem Relation Age of Onset  . Hypertension Father   . Diabetes Mother    Social History:  reports that she has never smoked. She has never used smokeless tobacco. She reports that she does not drink alcohol or use drugs.  Allergies:  Allergies  Allergen Reactions  . Prednisone     Increases blood sugar     (Not in a hospital admission)  Results for orders placed or performed during the hospital encounter of 02/13/18 (from the past 48 hour(s))  Pregnancy, urine POC     Status: None   Collection Time: 02/13/18  8:24 AM  Result Value Ref Range   Preg Test, Ur NEGATIVE NEGATIVE    Comment:        THE SENSITIVITY OF THIS METHODOLOGY IS >24 mIU/mL   Glucose, capillary     Status: Abnormal   Collection Time: 02/13/18  9:03 AM  Result Value Ref Range   Glucose-Capillary 137 (H) 65 - 99 mg/dL   No results found.  Review of Systems  Constitutional: Negative.   HENT: Negative.   Eyes: Negative.   Respiratory: Negative.   Cardiovascular: Negative.   Gastrointestinal: Negative.   Musculoskeletal: Negative.   Skin: Negative.   Neurological: Negative.   Psychiatric/Behavioral: Positive  for depression. Negative for hallucinations, memory loss, substance abuse and suicidal ideas. The patient is not nervous/anxious and does not have insomnia.     Blood pressure (!) 154/82, pulse (!) 103, temperature 97.9 F (36.6 C), temperature source Oral, height 5\' 3"  (1.6 m), weight 120.7 kg (266 lb), SpO2 100 %. Physical Exam  Nursing note and vitals reviewed. Constitutional: She appears well-developed and well-nourished.  HENT:  Head: Normocephalic and atraumatic.  Eyes: Pupils are equal, round, and reactive to light. Conjunctivae are normal.  Neck: Normal range of motion.  Cardiovascular: Regular rhythm and normal heart sounds.  Respiratory: Effort normal. No respiratory distress.  GI: Soft.  Musculoskeletal: Normal range of motion.  Neurological: She is alert.  Skin: Skin is warm and dry.  Psychiatric: Her affect is blunt. Her speech is delayed. She is slowed. Cognition and memory are impaired. She expresses impulsivity. She expresses no homicidal and no suicidal ideation.     Assessment/Plan As often is the case we spent some time trying to do some therapy with an encouragement of her to find more positive ways to deal with her stress.  Follow-up in 2 weeks  [name], MD 02/13/2018, 10:34 AM

## 2018-02-13 NOTE — Transfer of Care (Signed)
Immediate Anesthesia Transfer of Care Note  Patient: Melinda [name]  Procedure(s) Performed: ECT TX  Patient Location: PACU  Anesthesia Type:General  Level of Consciousness: sedated  Airway & Oxygen Therapy: Patient Spontanous Breathing and Patient connected to face mask oxygen  Post-op Assessment: Report given to RN and Post -op Vital signs reviewed and stable  Post vital signs: Reviewed and stable  Last Vitals:  Vitals Value Taken Time  BP    Temp    Pulse    Resp    SpO2      Last Pain:  Vitals:   02/13/18 0832  TempSrc:   PainSc: 0-No pain         Complications: No apparent anesthesia complications

## 2018-02-13 NOTE — Discharge Instructions (Signed)
1)  The drugs that you have been given will stay in your system until tomorrow so for the       next 24 hours you should not:  A. Drive an automobile  B. Make any legal decisions  C. Drink any alcoholic beverages  2)  You may resume your regular meals upon return home.  3)  A responsible adult must take you home.  Someone should stay with you for a few          hours, then be available by phone for the remainder of the treatment day.  4)  You May experience any of the following symptoms:  Headache, Nausea and a dry mouth (due to the medications you were given),  temporary memory loss and some confusion, or sore muscles (a warm bath  should help this).  If you you experience any of these symptoms let us know on                your return visit.  5)  Report any of the following: any acute discomfort, severe headache, or temperature        greater than 100.5 F.   Also report any unusual redness, swelling, drainage, or pain         at your IV site.    You may report Symptoms to:  [place] at [facility]          Phone: [phone], ECT Department           or Dr. Prescott Gum office [phone]  6)  Your next ECT Treatment is Friday April 19   We will call 2 days prior to your scheduled appointment for arrival times.  7)  Nothing to eat or drink after midnight the night before your procedure.  8)  Take     With a sip of water the morning of your procedure.  9)  Other Instructions: Call [phone] to cancel the morning of your procedure due         to illness or emergency.  10) We will call within 72 hours to assess how you are feeling.

## 2018-02-27 ENCOUNTER — Encounter: Payer: Self-pay | Admitting: Anesthesiology

## 2018-02-27 ENCOUNTER — Encounter
Admission: RE | Admit: 2018-02-27 | Discharge: 2018-02-27 | Disposition: A | Discharge status: Home / Self Care | Payer: Medicare PPO | Source: Ambulatory Visit | Attending: Psychiatry | Admitting: Psychiatry

## 2018-02-27 ENCOUNTER — Other Ambulatory Visit: Payer: Self-pay | Admitting: Psychiatry

## 2018-02-27 DIAGNOSIS — E7800 Pure hypercholesterolemia, unspecified: Secondary | ICD-10-CM | POA: Insufficient documentation

## 2018-02-27 DIAGNOSIS — I10 Essential (primary) hypertension: Secondary | ICD-10-CM | POA: Diagnosis not present

## 2018-02-27 DIAGNOSIS — Z79899 Other long term (current) drug therapy: Secondary | ICD-10-CM | POA: Insufficient documentation

## 2018-02-27 DIAGNOSIS — F332 Major depressive disorder, recurrent severe without psychotic features: Secondary | ICD-10-CM | POA: Diagnosis not present

## 2018-02-27 DIAGNOSIS — E1140 Type 2 diabetes mellitus with diabetic neuropathy, unspecified: Secondary | ICD-10-CM | POA: Diagnosis not present

## 2018-02-27 DIAGNOSIS — K219 Gastro-esophageal reflux disease without esophagitis: Secondary | ICD-10-CM | POA: Insufficient documentation

## 2018-02-27 DIAGNOSIS — F609 Personality disorder, unspecified: Secondary | ICD-10-CM | POA: Insufficient documentation

## 2018-02-27 DIAGNOSIS — F329 Major depressive disorder, single episode, unspecified: Secondary | ICD-10-CM | POA: Diagnosis not present

## 2018-02-27 DIAGNOSIS — E1142 Type 2 diabetes mellitus with diabetic polyneuropathy: Secondary | ICD-10-CM | POA: Diagnosis not present

## 2018-02-27 DIAGNOSIS — G4730 Sleep apnea, unspecified: Secondary | ICD-10-CM | POA: Diagnosis not present

## 2018-02-27 LAB — GLUCOSE, CAPILLARY: Glucose-Capillary: 136 mg/dL — ABNORMAL HIGH (ref 65–99)

## 2018-02-27 LAB — POCT PREGNANCY, URINE: Preg Test, Ur: NEGATIVE

## 2018-02-27 MED ORDER — LABETALOL HCL 5 MG/ML IV SOLN
INTRAVENOUS | Status: DC | PRN
  Administered 2018-02-27: 20 mg via INTRAVENOUS

## 2018-02-27 MED ORDER — ESMOLOL HCL 100 MG/10ML IV SOLN
INTRAVENOUS | Status: AC
  Filled 2018-02-27: qty 10

## 2018-02-27 MED ORDER — SODIUM CHLORIDE 0.9 % IV SOLN
INTRAVENOUS | Status: DC | PRN
  Administered 2018-02-27: 10:00:00 via INTRAVENOUS

## 2018-02-27 MED ORDER — GLYCOPYRROLATE 0.2 MG/ML IJ SOLN
INTRAMUSCULAR | Status: AC
  Administered 2018-02-27: 0.4 mg via INTRAVENOUS
  Filled 2018-02-27: qty 2

## 2018-02-27 MED ORDER — KETOROLAC TROMETHAMINE 30 MG/ML IJ SOLN
INTRAMUSCULAR | Status: AC
  Filled 2018-02-27: qty 1

## 2018-02-27 MED ORDER — ESMOLOL HCL 100 MG/10ML IV SOLN
INTRAVENOUS | Status: DC | PRN
  Administered 2018-02-27: 20 mg via INTRAVENOUS

## 2018-02-27 MED ORDER — KETOROLAC TROMETHAMINE 30 MG/ML IJ SOLN
INTRAMUSCULAR | Status: AC
  Administered 2018-02-27: 30 mg via INTRAVENOUS
  Filled 2018-02-27: qty 1

## 2018-02-27 MED ORDER — SODIUM CHLORIDE 0.9 % IV SOLN
500.0000 mL | Freq: Once | INTRAVENOUS | Status: AC
  Administered 2018-02-27: 500 mL via INTRAVENOUS

## 2018-02-27 MED ORDER — METHOHEXITAL SODIUM 100 MG/10ML IV SOSY
PREFILLED_SYRINGE | INTRAVENOUS | Status: DC | PRN
  Administered 2018-02-27: 70 mg via INTRAVENOUS

## 2018-02-27 MED ORDER — KETOROLAC TROMETHAMINE 30 MG/ML IJ SOLN
30.0000 mg | Freq: Once | INTRAMUSCULAR | Status: AC
  Administered 2018-02-27: 30 mg via INTRAVENOUS

## 2018-02-27 MED ORDER — LABETALOL HCL 5 MG/ML IV SOLN
INTRAVENOUS | Status: AC
  Filled 2018-02-27: qty 4

## 2018-02-27 MED ORDER — GLYCOPYRROLATE 0.2 MG/ML IJ SOLN
0.4000 mg | Freq: Once | INTRAMUSCULAR | Status: AC
  Administered 2018-02-27: 0.4 mg via INTRAVENOUS

## 2018-02-27 MED ORDER — SUCCINYLCHOLINE CHLORIDE 20 MG/ML IJ SOLN
INTRAMUSCULAR | Status: DC | PRN
  Administered 2018-02-27: 100 mg via INTRAVENOUS

## 2018-02-27 MED ORDER — METHOHEXITAL SODIUM 0.5 G IJ SOLR
INTRAMUSCULAR | Status: AC
  Filled 2018-02-27: qty 500

## 2018-02-27 NOTE — Anesthesia Postprocedure Evaluation (Signed)
Anesthesia Post Note  Patient: Melinda [name]  Procedure(s) Performed: ECT TX  Patient location during evaluation: PACU Anesthesia Type: General Level of consciousness: awake and alert Pain management: pain level controlled Vital Signs Assessment: post-procedure vital signs reviewed and stable Respiratory status: spontaneous breathing, nonlabored ventilation, respiratory function stable and patient connected to nasal cannula oxygen Cardiovascular status: blood pressure returned to baseline and stable Postop Assessment: no apparent nausea or vomiting Anesthetic complications: no     Last Vitals:  Vitals:   02/27/18 1035 02/27/18 1045  BP: (!) 124/99 125/88  Pulse: 94 89  Resp: 20   Temp:  36.8 C  SpO2: 95% 98%    Last Pain:  Vitals:   02/27/18 1045  PainSc: [place]

## 2018-02-27 NOTE — Transfer of Care (Signed)
Immediate Anesthesia Transfer of Care Note  Patient: Melinda [name]  Procedure(s) Performed: ECT TX  Patient Location: PACU  Anesthesia Type:General  Level of Consciousness: drowsy and patient cooperative  Airway & Oxygen Therapy: Patient Spontanous Breathing and Patient connected to face mask oxygen  Post-op Assessment: Report given to RN and Post -op Vital signs reviewed and stable  Post vital signs: Reviewed and stable  Last Vitals:  Vitals Value Taken Time  BP    Temp    Pulse 102 02/27/2018 10:24 AM  Resp 20 02/27/2018 10:24 AM  SpO2 98 % 02/27/2018 10:24 AM    Last Pain: There were no vitals filed for this visit.       Complications: No apparent anesthesia complications

## 2018-02-27 NOTE — Anesthesia Post-op Follow-up Note (Signed)
Anesthesia QCDR form completed.        

## 2018-02-27 NOTE — Discharge Instructions (Signed)
1)  The drugs that you have been given will stay in your system until tomorrow so for the       next 24 hours you should not:  A. Drive an automobile  B. Make any legal decisions  C. Drink any alcoholic beverages  2)  You may resume your regular meals upon return home.  3)  A responsible adult must take you home.  Someone should stay with you for a few          hours, then be available by phone for the remainder of the treatment day.  4)  You May experience any of the following symptoms:  Headache, Nausea and a dry mouth (due to the medications you were given),  temporary memory loss and some confusion, or sore muscles (a warm bath  should help this).  If you you experience any of these symptoms let us know on                your return visit.  5)  Report any of the following: any acute discomfort, severe headache, or temperature        greater than 100.5 F.   Also report any unusual redness, swelling, drainage, or pain         at your IV site.    You may report Symptoms to:  [place] at [facility]          Phone: [phone], ECT Department           or Dr. Prescott Gum office [phone]  6)  Your next ECT Treatment is Day Friday  Date Mar 13, 2018 8am  We will call 2 days prior to your scheduled appointment for arrival times.  7)  Nothing to eat or drink after midnight the night before your procedure.  8)  Take .     With a sip of water the morning of your procedure.  9)  Other Instructions: Call [phone] to cancel the morning of your procedure due         to illness or emergency.  10) We will call within 72 hours to assess how you are feeling.

## 2018-02-27 NOTE — Procedures (Signed)
ECT SERVICES Physician's Interval Evaluation & Treatment Note  Patient Identification: Melinda [name] MRN:  [id number] Date of Evaluation:  02/27/2018 TX #: [id number]  MADRS:   MMSE:   P.E. Findings:  Physical exam is unchanged  Psychiatric Interval Note:  Chronic depression no active suicidal thoughts no psychosis  Subjective:  Patient is a 47 y.o. female seen for evaluation for Electroconvulsive Therapy. Pretty stable  Treatment Summary:   []   Right Unilateral             [x]  Bilateral   % Energy : 1.0 ms 35%   Impedance: 960 ohms  Seizure Energy Index: 9744 V squared  Postictal Suppression Index: 88%  Seizure Concordance Index: 99%  Medications  Pre Shock: Robinul 0.3 mg Toradol 30 mg labetalol 20 mg esmolol 10 mg Brevital 70 mg succinylcholine 100 mg  Post Shock:    Seizure Duration: 44 seconds by EMG 95 seconds by EEG   Comments: Follow-up in 2 weeks  Lungs:  [x]   Clear to auscultation               []  Other:   Heart:    [x]   Regular rhythm             []  irregular rhythm    [x]   Previous H&P reviewed, patient examined and there are NO CHANGES                 []   Previous H&P reviewed, patient examined and there are changes noted.   [name], MD 4/19/201910:09 AM

## 2018-02-27 NOTE — H&P (Signed)
Melinda [name] is an 47 y.o. female.   Chief Complaint: Chronic depression and anxiety HPI: History of recurrent and chronic depression with partial improvement with ECT  Past Medical History:  Diagnosis Date  . Depression   . Diabetes mellitus without complication ([place])   . Diabetic peripheral neuropathy ([place]) 03/07/15  . Diabetic peripheral neuropathy ([place]) 03/07/15  . Diabetic peripheral neuropathy ([place]) 03/07/15  . GERD (gastroesophageal reflux disease)   . Hypercholesterolemia 03/07/15  . Hypertension   . Obesity 03/07/15  . Personality disorder ([place]) 03/07/15  . Sinus tachycardia 03/07/15   history of  . Suicidal thoughts 03/07/15    Past Surgical History:  Procedure Laterality Date  . electroconvulsion therapy  03/07/15    Family History  Problem Relation Age of Onset  . Hypertension Father   . Diabetes Mother    Social History:  reports that she has never smoked. She has never used smokeless tobacco. She reports that she does not drink alcohol or use drugs.  Allergies:  Allergies  Allergen Reactions  . Prednisone     Increases blood sugar     (Not in a hospital admission)  Results for orders placed or performed during the hospital encounter of 02/27/18 (from the past 48 hour(s))  Pregnancy, urine POC     Status: None   Collection Time: 02/27/18  8:27 AM  Result Value Ref Range   Preg Test, Ur NEGATIVE NEGATIVE    Comment:        THE SENSITIVITY OF THIS METHODOLOGY IS >24 mIU/mL   Glucose, capillary     Status: Abnormal   Collection Time: 02/27/18  8:33 AM  Result Value Ref Range   Glucose-Capillary 136 (H) 65 - 99 mg/dL   No results found.  Review of Systems  Constitutional: Negative.   HENT: Negative.   Eyes: Negative.   Respiratory: Negative.   Cardiovascular: Negative.   Gastrointestinal: Negative.   Musculoskeletal: Negative.   Skin: Negative.   Neurological: Negative.   Psychiatric/Behavioral: Positive for depression. Negative for  hallucinations, memory loss, substance abuse and suicidal ideas. The patient is not nervous/anxious and does not have insomnia.     Blood pressure (!) 144/92, pulse 88, temperature (!) 97.2 F (36.2 C), height 5\' 3"  (1.6 m), weight 267 lb (121.1 kg), last menstrual period 02/27/2018, SpO2 99 %. Physical Exam  Nursing note and vitals reviewed. Constitutional: She appears well-developed and well-nourished.  HENT:  Head: Normocephalic and atraumatic.  Eyes: Pupils are equal, round, and reactive to light. Conjunctivae are normal.  Neck: Normal range of motion.  Cardiovascular: Regular rhythm and normal heart sounds.  Respiratory: Effort normal. No respiratory distress.  GI: Soft.  Musculoskeletal: Normal range of motion.  Neurological: She is alert.  Skin: Skin is warm and dry.  Psychiatric: Judgment normal. Her speech is delayed. She is slowed. Cognition and memory are normal. She exhibits a depressed mood. She expresses no suicidal ideation.     Assessment/Plan Follow-up 2 weeks  [name], MD 02/27/2018, 10:07 AM

## 2018-02-27 NOTE — Anesthesia Preprocedure Evaluation (Signed)
Anesthesia Evaluation  Patient identified by MRN, date of birth, ID band Patient awake    Reviewed: Allergy & Precautions, NPO status , Patient's Chart, lab work & pertinent test results, reviewed documented beta blocker date and time   Airway Mallampati: III  TM Distance: >3 FB     Dental  (+) Chipped   Pulmonary sleep apnea ,           Cardiovascular hypertension, Pt. on medications      Neuro/Psych PSYCHIATRIC DISORDERS Depression Bipolar Disorder  Neuromuscular disease    GI/Hepatic GERD  Controlled,  Endo/Other  diabetes, Type 2  Renal/GU      Musculoskeletal   Abdominal   Peds  Hematology   Anesthesia Other Findings Obese.  Reproductive/Obstetrics                             Anesthesia Physical Anesthesia Plan  ASA: III  Anesthesia Plan: General   Post-op Pain Management:    Induction: Intravenous  PONV Risk Score and Plan:   Airway Management Planned:   Additional Equipment:   Intra-op Plan:   Post-operative Plan:   Informed Consent: I have reviewed the patients History and Physical, chart, labs and discussed the procedure including the risks, benefits and alternatives for the proposed anesthesia with the patient or authorized representative who has indicated his/her understanding and acceptance.       Plan Discussed with: CRNA  Anesthesia Plan Comments:         Anesthesia Quick Evaluation  

## 2018-03-05 ENCOUNTER — Other Ambulatory Visit: Payer: Self-pay | Admitting: Psychiatry

## 2018-03-11 ENCOUNTER — Other Ambulatory Visit: Payer: Self-pay | Admitting: Psychiatry

## 2018-03-11 ENCOUNTER — Telehealth: Payer: Self-pay

## 2018-03-12 ENCOUNTER — Other Ambulatory Visit: Payer: Self-pay | Admitting: Psychiatry

## 2018-03-13 ENCOUNTER — Encounter: Payer: Self-pay | Admitting: Anesthesiology

## 2018-03-13 ENCOUNTER — Encounter
Admission: RE | Admit: 2018-03-13 | Discharge: 2018-03-13 | Disposition: A | Discharge status: Home / Self Care | Payer: Medicare PPO | Source: Ambulatory Visit | Attending: Psychiatry | Admitting: Psychiatry

## 2018-03-13 DIAGNOSIS — I10 Essential (primary) hypertension: Secondary | ICD-10-CM | POA: Diagnosis not present

## 2018-03-13 DIAGNOSIS — F319 Bipolar disorder, unspecified: Secondary | ICD-10-CM | POA: Diagnosis not present

## 2018-03-13 DIAGNOSIS — Z6841 Body Mass Index (BMI) 40.0 and over, adult: Secondary | ICD-10-CM | POA: Diagnosis not present

## 2018-03-13 DIAGNOSIS — E119 Type 2 diabetes mellitus without complications: Secondary | ICD-10-CM | POA: Diagnosis not present

## 2018-03-13 DIAGNOSIS — F332 Major depressive disorder, recurrent severe without psychotic features: Secondary | ICD-10-CM

## 2018-03-13 DIAGNOSIS — Z79899 Other long term (current) drug therapy: Secondary | ICD-10-CM | POA: Insufficient documentation

## 2018-03-13 DIAGNOSIS — E669 Obesity, unspecified: Secondary | ICD-10-CM | POA: Diagnosis not present

## 2018-03-13 DIAGNOSIS — K219 Gastro-esophageal reflux disease without esophagitis: Secondary | ICD-10-CM | POA: Diagnosis not present

## 2018-03-13 LAB — GLUCOSE, CAPILLARY: Glucose-Capillary: 120 mg/dL — ABNORMAL HIGH (ref 65–99)

## 2018-03-13 LAB — POCT PREGNANCY, URINE: Preg Test, Ur: NEGATIVE

## 2018-03-13 MED ORDER — LABETALOL HCL 5 MG/ML IV SOLN
INTRAVENOUS | Status: DC | PRN
  Administered 2018-03-13: 20 mg via INTRAVENOUS

## 2018-03-13 MED ORDER — METHOHEXITAL SODIUM 100 MG/10ML IV SOSY
PREFILLED_SYRINGE | INTRAVENOUS | Status: DC | PRN
  Administered 2018-03-13: 70 mg via INTRAVENOUS

## 2018-03-13 MED ORDER — KETOROLAC TROMETHAMINE 30 MG/ML IJ SOLN
INTRAMUSCULAR | Status: AC
  Administered 2018-03-13: 30 mg via INTRAVENOUS
  Filled 2018-03-13: qty 1

## 2018-03-13 MED ORDER — ONDANSETRON HCL 4 MG/2ML IJ SOLN: 4.0000 mg | Freq: Once | INTRAMUSCULAR | Status: DC | PRN

## 2018-03-13 MED ORDER — SODIUM CHLORIDE 0.9 % IV SOLN
500.0000 mL | Freq: Once | INTRAVENOUS | Status: AC
  Administered 2018-03-13: 500 mL via INTRAVENOUS

## 2018-03-13 MED ORDER — KETOROLAC TROMETHAMINE 30 MG/ML IJ SOLN
30.0000 mg | Freq: Once | INTRAMUSCULAR | Status: AC
  Administered 2018-03-13: 30 mg via INTRAVENOUS

## 2018-03-13 MED ORDER — FENTANYL CITRATE (PF) 100 MCG/2ML IJ SOLN: 25.0000 ug | INTRAMUSCULAR | Status: DC | PRN

## 2018-03-13 MED ORDER — LABETALOL HCL 5 MG/ML IV SOLN
INTRAVENOUS | Status: AC
  Filled 2018-03-13: qty 4

## 2018-03-13 MED ORDER — GLYCOPYRROLATE 0.2 MG/ML IJ SOLN
0.4000 mg | Freq: Once | INTRAMUSCULAR | Status: AC
  Administered 2018-03-13: 0.4 mg via INTRAVENOUS

## 2018-03-13 MED ORDER — SUCCINYLCHOLINE CHLORIDE 20 MG/ML IJ SOLN
INTRAMUSCULAR | Status: DC | PRN
  Administered 2018-03-13: 100 mg via INTRAVENOUS

## 2018-03-13 MED ORDER — SODIUM CHLORIDE 0.9 % IV SOLN
INTRAVENOUS | Status: DC | PRN
  Administered 2018-03-13: 10:00:00 via INTRAVENOUS

## 2018-03-13 MED ORDER — METHOHEXITAL SODIUM 0.5 G IJ SOLR
INTRAMUSCULAR | Status: AC
  Filled 2018-03-13: qty 500

## 2018-03-13 MED ORDER — GLYCOPYRROLATE 0.2 MG/ML IJ SOLN
INTRAMUSCULAR | Status: AC
  Filled 2018-03-13: qty 2

## 2018-03-13 MED ORDER — ESMOLOL HCL 100 MG/10ML IV SOLN
INTRAVENOUS | Status: DC | PRN
  Administered 2018-03-13: 20 mg via INTRAVENOUS

## 2018-03-13 MED ORDER — ESMOLOL HCL 100 MG/10ML IV SOLN
INTRAVENOUS | Status: AC
  Filled 2018-03-13: qty 10

## 2018-03-13 NOTE — Procedures (Signed)
ECT SERVICES Physician's Interval Evaluation & Treatment Note  Patient Identification: Melinda [name] MRN:  [id number] Date of Evaluation:  03/13/2018 TX #: [id number]  MADRS: 25  MMSE: 29 P.E. Findings:  No change to physical exam.  Psychiatric Interval Note:  Chronic mild depression not suicidal not psychotic  Subjective:  Patient is a 47 y.o. female seen for evaluation for Electroconvulsive Therapy. Chronic depression  Treatment Summary:   []   Right Unilateral             [x]  Bilateral   % Energy : 1.0 ms 35%   Impedance: 920 ohms  Seizure Energy Index: 5964 V squared  Postictal Suppression Index: 68%  Seizure Concordance Index: 95%  Medications  Pre Shock: Robinul 0.4 mg, Toradol 30 mg, Brevital 70 mg, succinylcholine 100 mg labetalol 20 mg esmolol 10 mg    Post Shock: None  Seizure Duration: 45 seconds by EMG 81 seconds by EEG   Comments: Follow-up 2 weeks  Lungs:  [x]   Clear to auscultation               []  Other:   Heart:    [x]   Regular rhythm             []  irregular rhythm    [x]   Previous H&P reviewed, patient examined and there are NO CHANGES                 []   Previous H&P reviewed, patient examined and there are changes noted.   [name], MD 5/3/201911:35 AM

## 2018-03-13 NOTE — Anesthesia Postprocedure Evaluation (Signed)
Anesthesia Post Note  Patient: Melinda [name]  Procedure(s) Performed: ECT TX  Patient location during evaluation: PACU Anesthesia Type: General Level of consciousness: awake and alert Pain management: pain level controlled Vital Signs Assessment: post-procedure vital signs reviewed and stable Respiratory status: spontaneous breathing, nonlabored ventilation, respiratory function stable and patient connected to nasal cannula oxygen Cardiovascular status: blood pressure returned to baseline and stable Postop Assessment: no apparent nausea or vomiting Anesthetic complications: no     Last Vitals:  Vitals:   03/13/18 1047 03/13/18 1110  BP: 126/78 (!) 144/83  Pulse: 90 84  Resp: 17 18  Temp:    SpO2: 93%     Last Pain:  Vitals:   03/13/18 1037  PainSc: 0-No pain                 Tremaine Earwood S

## 2018-03-13 NOTE — Anesthesia Procedure Notes (Signed)
Performed by: Zamyra Allensworth, CRNA Pre-anesthesia Checklist: Patient identified, Patient being monitored, Timeout performed, Emergency Drugs available and Suction available Patient Re-evaluated:Patient Re-evaluated prior to induction Oxygen Delivery Method: Circle system utilized Preoxygenation: Pre-oxygenation with 100% oxygen Ventilation: Mask ventilation without difficulty Airway Equipment and Method: Bite block Dental Injury: Teeth and Oropharynx as per pre-operative assessment        

## 2018-03-13 NOTE — Anesthesia Preprocedure Evaluation (Signed)
Anesthesia Evaluation  Patient identified by MRN, date of birth, ID band Patient awake    Reviewed: Allergy & Precautions, NPO status , Patient's Chart, lab work & pertinent test results, reviewed documented beta blocker date and time   Airway Mallampati: III  TM Distance: >3 FB     Dental  (+) Chipped   Pulmonary sleep apnea ,           Cardiovascular hypertension, Pt. on medications      Neuro/Psych PSYCHIATRIC DISORDERS Depression Bipolar Disorder  Neuromuscular disease    GI/Hepatic GERD  Controlled,  Endo/Other  diabetes, Type 2  Renal/GU      Musculoskeletal   Abdominal   Peds  Hematology   Anesthesia Other Findings Obese.  Reproductive/Obstetrics                             Anesthesia Physical Anesthesia Plan  ASA: III  Anesthesia Plan: General   Post-op Pain Management:    Induction: Intravenous  PONV Risk Score and Plan:   Airway Management Planned:   Additional Equipment:   Intra-op Plan:   Post-operative Plan:   Informed Consent: I have reviewed the patients History and Physical, chart, labs and discussed the procedure including the risks, benefits and alternatives for the proposed anesthesia with the patient or authorized representative who has indicated his/her understanding and acceptance.       Plan Discussed with: CRNA  Anesthesia Plan Comments:         Anesthesia Quick Evaluation  

## 2018-03-13 NOTE — Addendum Note (Signed)
Addendum  created 03/13/18 1329 by Demetrius Charity, CRNA   Intraprocedure Event edited

## 2018-03-13 NOTE — Discharge Instructions (Signed)
1)  The drugs that you have been given will stay in your system until tomorrow so for the       next 24 hours you should not:  A. Drive an automobile  B. Make any legal decisions  C. Drink any alcoholic beverages  2)  You may resume your regular meals upon return home.  3)  A responsible adult must take you home.  Someone should stay with you for a few          hours, then be available by phone for the remainder of the treatment day.  4)  You May experience any of the following symptoms:  Headache, Nausea and a dry mouth (due to the medications you were given),  temporary memory loss and some confusion, or sore muscles (a warm bath  should help this).  If you you experience any of these symptoms let us know on                your return visit.  5)  Report any of the following: any acute discomfort, severe headache, or temperature        greater than 100.5 F.   Also report any unusual redness, swelling, drainage, or pain         at your IV site.    You may report Symptoms to:  [place] at [facility]          Phone: [phone], ECT Department           or Dr. Prescott Gum office [phone] 6)  Your next ECT Treatment is Day Friday  Date Mar 27, 2018 at 8am  We will call 2 days prior to your scheduled appointment for arrival times.  7)  Nothing to eat or drink after midnight the night before your procedure.  8)  Take .     With a sip of water the morning of your procedure.  9)  Other Instructions: Call [phone] to cancel the morning of your procedure due         to illness or emergency.  10) We will call within 72 hours to assess how you are feeling.

## 2018-03-13 NOTE — Anesthesia Post-op Follow-up Note (Signed)
Anesthesia QCDR form completed.        

## 2018-03-13 NOTE — Transfer of Care (Signed)
Immediate Anesthesia Transfer of Care Note  Patient: Melinda [name]  Procedure(s) Performed: ECT TX  Patient Location: PACU  Anesthesia Type:MAC  Level of Consciousness: awake, alert  and oriented  Airway & Oxygen Therapy: Patient Spontanous Breathing and Patient connected to face mask oxygen  Post-op Assessment: Report given to RN and Post -op Vital signs reviewed and stable  Post vital signs: Reviewed and stable  Last Vitals:  Vitals Value Taken Time  BP    Temp    Pulse    Resp    SpO2      Last Pain: There were no vitals filed for this visit.       Complications: No apparent anesthesia complications

## 2018-03-13 NOTE — H&P (Signed)
Melinda [name] is an 47 y.o. female.   Chief Complaint: Patient has chronic mild depression no specific new complaint or change HPI: History of chronic depression with some improvement in stability with ECT maintenance  Past Medical History:  Diagnosis Date  . Depression   . Diabetes mellitus without complication ([place])   . Diabetic peripheral neuropathy ([place]) 03/07/15  . Diabetic peripheral neuropathy ([place]) 03/07/15  . Diabetic peripheral neuropathy ([place]) 03/07/15  . GERD (gastroesophageal reflux disease)   . Hypercholesterolemia 03/07/15  . Hypertension   . Obesity 03/07/15  . Personality disorder ([place]) 03/07/15  . Sinus tachycardia 03/07/15   history of  . Suicidal thoughts 03/07/15    Past Surgical History:  Procedure Laterality Date  . electroconvulsion therapy  03/07/15    Family History  Problem Relation Age of Onset  . Hypertension Father   . Diabetes Mother    Social History:  reports that she has never smoked. She has never used smokeless tobacco. She reports that she does not drink alcohol or use drugs.  Allergies:  Allergies  Allergen Reactions  . Prednisone     Increases blood sugar     (Not in a hospital admission)  Results for orders placed or performed during the hospital encounter of 03/13/18 (from the past 48 hour(s))  Pregnancy, urine POC     Status: None   Collection Time: 03/13/18  8:33 AM  Result Value Ref Range   Preg Test, Ur NEGATIVE NEGATIVE    Comment:        THE SENSITIVITY OF THIS METHODOLOGY IS >24 mIU/mL   Glucose, capillary     Status: Abnormal   Collection Time: 03/13/18  8:40 AM  Result Value Ref Range   Glucose-Capillary 120 (H) 65 - 99 mg/dL   Comment 1 Notify RN    No results found.  Review of Systems  Constitutional: Negative.   HENT: Negative.   Eyes: Negative.   Respiratory: Negative.   Cardiovascular: Negative.   Gastrointestinal: Negative.   Musculoskeletal: Negative.   Skin: Negative.   Neurological: Negative.    Psychiatric/Behavioral: Negative.     Blood pressure (!) 144/83, pulse 84, temperature 99.4 F (37.4 C), resp. rate 18, height 5\' 3"  (1.6 m), weight 120.2 kg (265 lb), last menstrual period 02/27/2018, SpO2 93 %. Physical Exam  Nursing note and vitals reviewed. Constitutional: She appears well-developed and well-nourished.  HENT:  Head: Normocephalic and atraumatic.  Eyes: Pupils are equal, round, and reactive to light. Conjunctivae are normal.  Neck: Normal range of motion.  Cardiovascular: Regular rhythm and normal heart sounds.  Respiratory: Effort normal. No respiratory distress.  GI: Soft.  Musculoskeletal: Normal range of motion.  Neurological: She is alert.  Skin: Skin is warm and dry.  Psychiatric: She has a normal mood and affect. Her behavior is normal. Judgment and thought content normal.     Assessment/Plan Supportive counseling and therapy ECT today follow-up 2 weeks  [name], MD 03/13/2018, 11:33 AM

## 2018-03-25 ENCOUNTER — Telehealth: Payer: Self-pay

## 2018-03-26 ENCOUNTER — Other Ambulatory Visit: Payer: Self-pay | Admitting: Psychiatry

## 2018-03-27 ENCOUNTER — Encounter: Payer: Self-pay | Admitting: Certified Registered"

## 2018-03-27 ENCOUNTER — Encounter (HOSPITAL_BASED_OUTPATIENT_CLINIC_OR_DEPARTMENT_OTHER)
Admission: RE | Admit: 2018-03-27 | Discharge: 2018-03-27 | Disposition: A | Discharge status: Home / Self Care | Payer: Medicare PPO | Source: Ambulatory Visit | Attending: Psychiatry | Admitting: Psychiatry

## 2018-03-27 DIAGNOSIS — I10 Essential (primary) hypertension: Secondary | ICD-10-CM | POA: Diagnosis not present

## 2018-03-27 DIAGNOSIS — F329 Major depressive disorder, single episode, unspecified: Secondary | ICD-10-CM | POA: Diagnosis not present

## 2018-03-27 DIAGNOSIS — F332 Major depressive disorder, recurrent severe without psychotic features: Secondary | ICD-10-CM | POA: Diagnosis not present

## 2018-03-27 DIAGNOSIS — K219 Gastro-esophageal reflux disease without esophagitis: Secondary | ICD-10-CM | POA: Diagnosis not present

## 2018-03-27 DIAGNOSIS — Z79899 Other long term (current) drug therapy: Secondary | ICD-10-CM | POA: Diagnosis not present

## 2018-03-27 DIAGNOSIS — F319 Bipolar disorder, unspecified: Secondary | ICD-10-CM | POA: Diagnosis not present

## 2018-03-27 DIAGNOSIS — E119 Type 2 diabetes mellitus without complications: Secondary | ICD-10-CM | POA: Diagnosis not present

## 2018-03-27 LAB — GLUCOSE, CAPILLARY
Glucose-Capillary: 137 mg/dL — ABNORMAL HIGH (ref 65–99)
Glucose-Capillary: 142 mg/dL — ABNORMAL HIGH (ref 65–99)

## 2018-03-27 MED ORDER — LABETALOL HCL 5 MG/ML IV SOLN
INTRAVENOUS | Status: DC | PRN
  Administered 2018-03-27: 20 mg via INTRAVENOUS

## 2018-03-27 MED ORDER — METHOHEXITAL SODIUM 100 MG/10ML IV SOSY
PREFILLED_SYRINGE | INTRAVENOUS | Status: DC | PRN
  Administered 2018-03-27: 70 mg via INTRAVENOUS

## 2018-03-27 MED ORDER — SUCCINYLCHOLINE CHLORIDE 20 MG/ML IJ SOLN
INTRAMUSCULAR | Status: DC | PRN
  Administered 2018-03-27: 100 mg via INTRAVENOUS

## 2018-03-27 MED ORDER — ESMOLOL HCL 100 MG/10ML IV SOLN
INTRAVENOUS | Status: AC
  Filled 2018-03-27: qty 10

## 2018-03-27 MED ORDER — SODIUM CHLORIDE 0.9 % IV SOLN
500.0000 mL | Freq: Once | INTRAVENOUS | Status: AC
  Administered 2018-03-27: 500 mL via INTRAVENOUS

## 2018-03-27 MED ORDER — GLYCOPYRROLATE 0.2 MG/ML IJ SOLN
0.4000 mg | Freq: Once | INTRAMUSCULAR | Status: AC
  Administered 2018-03-27: 0.4 mg via INTRAVENOUS

## 2018-03-27 MED ORDER — GLYCOPYRROLATE 0.2 MG/ML IJ SOLN
INTRAMUSCULAR | Status: AC
  Filled 2018-03-27: qty 2

## 2018-03-27 MED ORDER — LABETALOL HCL 5 MG/ML IV SOLN
INTRAVENOUS | Status: AC
  Filled 2018-03-27: qty 4

## 2018-03-27 MED ORDER — KETOROLAC TROMETHAMINE 30 MG/ML IJ SOLN
INTRAMUSCULAR | Status: AC
  Administered 2018-03-27: 30 mg via INTRAVENOUS
  Filled 2018-03-27: qty 1

## 2018-03-27 MED ORDER — KETOROLAC TROMETHAMINE 30 MG/ML IJ SOLN
30.0000 mg | Freq: Once | INTRAMUSCULAR | Status: AC
  Administered 2018-03-27: 30 mg via INTRAVENOUS

## 2018-03-27 MED ORDER — SUCCINYLCHOLINE CHLORIDE 20 MG/ML IJ SOLN
INTRAMUSCULAR | Status: AC
Start: 2018-03-27 — End: ?
  Filled 2018-03-27: qty 1

## 2018-03-27 MED ORDER — ESMOLOL HCL 100 MG/10ML IV SOLN
INTRAVENOUS | Status: DC | PRN
  Administered 2018-03-27: 20 mg via INTRAVENOUS

## 2018-03-27 MED ORDER — METHOHEXITAL SODIUM 0.5 G IJ SOLR
INTRAMUSCULAR | Status: AC
  Filled 2018-03-27: qty 500

## 2018-03-27 NOTE — OR Nursing (Signed)
Restless unable to get B/P at this time

## 2018-03-27 NOTE — Anesthesia Procedure Notes (Addendum)
Date/Time: 03/27/2018 10:25 AM Performed by: Lance Muss, CRNA Pre-anesthesia Checklist: Patient identified, Emergency Drugs available, Suction available and Patient being monitored Patient Re-evaluated:Patient Re-evaluated prior to induction Oxygen Delivery Method: Circle system utilized Preoxygenation: Pre-oxygenation with 100% oxygen Induction Type: IV induction Ventilation: Mask ventilation without difficulty and Mask ventilation throughout procedure Airway Equipment and Method: Bite block Placement Confirmation: positive ETCO2 Dental Injury: Teeth and Oropharynx as per pre-operative assessment

## 2018-03-27 NOTE — H&P (Signed)
Melinda [name] is an 47 y.o. female.   Chief Complaint: Patient with chronic depression.  Stable.  History of recurrent severe depression partial improvement with chronic ECT HPI: See note above.  Chronic depression with maintenance ECT  Past Medical History:  Diagnosis Date  . Depression   . Diabetes mellitus without complication ([place])   . Diabetic peripheral neuropathy ([place]) 03/07/15  . Diabetic peripheral neuropathy ([place]) 03/07/15  . Diabetic peripheral neuropathy ([place]) 03/07/15  . GERD (gastroesophageal reflux disease)   . Hypercholesterolemia 03/07/15  . Hypertension   . Obesity 03/07/15  . Personality disorder ([place]) 03/07/15  . Sinus tachycardia 03/07/15   history of  . Suicidal thoughts 03/07/15    Past Surgical History:  Procedure Laterality Date  . electroconvulsion therapy  03/07/15    Family History  Problem Relation Age of Onset  . Hypertension Father   . Diabetes Mother    Social History:  reports that she has never smoked. She has never used smokeless tobacco. She reports that she does not drink alcohol or use drugs.  Allergies:  Allergies  Allergen Reactions  . Prednisone     Increases blood sugar     (Not in a hospital admission)  Results for orders placed or performed during the hospital encounter of 03/27/18 (from the past 48 hour(s))  Glucose, capillary     Status: Abnormal   Collection Time: 03/27/18  8:18 AM  Result Value Ref Range   Glucose-Capillary 137 (H) 65 - 99 mg/dL   No results found.  Review of Systems  Constitutional: Negative.   HENT: Negative.   Eyes: Negative.   Respiratory: Negative.   Cardiovascular: Negative.   Gastrointestinal: Negative.   Musculoskeletal: Negative.   Skin: Negative.   Neurological: Negative.   Psychiatric/Behavioral: Positive for depression. Negative for hallucinations, memory loss, substance abuse and suicidal ideas. The patient is not nervous/anxious and does not have insomnia.     Blood pressure (!)  154/96, pulse (!) 105, temperature 97.7 F (36.5 C), temperature source Oral, resp. rate 18, height 5\' 3"  (1.6 m), weight 123.8 kg (273 lb), last menstrual period 03/16/2018, SpO2 98 %. Physical Exam  Nursing note and vitals reviewed. Constitutional: She appears well-developed and well-nourished.  HENT:  Head: Normocephalic and atraumatic.  Eyes: Pupils are equal, round, and reactive to light. Conjunctivae are normal.  Neck: Normal range of motion.  Cardiovascular: Regular rhythm and normal heart sounds.  Respiratory: Effort normal.  GI: Soft.  Musculoskeletal: Normal range of motion.  Neurological: She is alert.  Skin: Skin is warm and dry.  Psychiatric: Her speech is normal and behavior is normal. Judgment and thought content normal. Her affect is blunt. Cognition and memory are normal.     Assessment/Plan Follow-up 2 weeks  [name], MD 03/27/2018, 10:17 AM

## 2018-03-27 NOTE — Transfer of Care (Signed)
Immediate Anesthesia Transfer of Care Note  Patient: Melinda [name]  Procedure(s) Performed: ECT TX  Patient Location: PACU  Anesthesia Type:General  Level of Consciousness: sedated and responds to stimulation  Airway & Oxygen Therapy: Patient Spontanous Breathing and Patient connected to face mask oxygen  Post-op Assessment: Report given to RN and Post -op Vital signs reviewed and stable  Post vital signs: Reviewed and stable  Last Vitals:  Vitals Value Taken Time  BP    Temp    Pulse 103 03/27/2018 10:37 AM  Resp 26 03/27/2018 10:37 AM  SpO2 97 % 03/27/2018 10:37 AM  Vitals shown include unvalidated device data.  Last Pain:  Vitals:   03/27/18 0824  TempSrc: Oral  PainSc: 0-No pain         Complications: No apparent anesthesia complications

## 2018-03-27 NOTE — Discharge Instructions (Signed)
1)  The drugs that you have been given will stay in your system until tomorrow so for the       next 24 hours you should not:  A. Drive an automobile  B. Make any legal decisions  C. Drink any alcoholic beverages  2)  You may resume your regular meals upon return home.  3)  A responsible adult must take you home.  Someone should stay with you for a few          hours, then be available by phone for the remainder of the treatment day.  4)  You May experience any of the following symptoms:  Headache, Nausea and a dry mouth (due to the medications you were given),  temporary memory loss and some confusion, or sore muscles (a warm bath  should help this).  If you you experience any of these symptoms let us know on                your return visit.  5)  Report any of the following: any acute discomfort, severe headache, or temperature        greater than 100.5 F.   Also report any unusual redness, swelling, drainage, or pain         at your IV site.    You may report Symptoms to:  [place] at [facility]          Phone: [phone], ECT Department           or Dr. Prescott Gum office [phone]  6)  Your next ECT Treatment is Day Friday  Date Apr 10, 2018 at 0800  We will call 2 days prior to your scheduled appointment for arrival times.  7)  Nothing to eat or drink after midnight the night before your procedure.  8)  Take .    With a sip of water the morning of your procedure.  9)  Other Instructions: Call [phone] to cancel the morning of your procedure due         to illness or emergency.  10) We will call within 72 hours to assess how you are feeling.

## 2018-03-27 NOTE — Procedures (Signed)
ECT SERVICES Physician's Interval Evaluation & Treatment Note  Patient Identification: Melinda [name] MRN:  [id number] Date of Evaluation:  03/27/2018 TX #: [id number]  MADRS:   MMSE:   P.E. Findings:  No change to physical exam  Psychiatric Interval Note:  Mood continues to be chronically depressed and anxious no worse than usual  Subjective:  Patient is a 47 y.o. female seen for evaluation for Electroconvulsive Therapy. No specific new complaint  Treatment Summary:   []   Right Unilateral             [x]  Bilateral   % Energy : 1.0 ms 35%   Impedance: 1280 ohms  Seizure Energy Index: 7327 V squared  Postictal Suppression Index: 75%  Seizure Concordance Index: 95%  Medications  Pre Shock: Robinul 0.4 mg Toradol 30 mg labetalol 20 mg esmolol 10 mg Brevital 70 mg succinylcholine 100 mg  Post Shock:    Seizure Duration: 49 seconds by EMG 97 seconds by EEG   Comments: Follow-up 2 weeks  Lungs:  [x]   Clear to auscultation               []  Other:   Heart:    [x]   Regular rhythm             []  irregular rhythm    [x]   Previous H&P reviewed, patient examined and there are NO CHANGES                 []   Previous H&P reviewed, patient examined and there are changes noted.   [name], MD 5/17/201910:18 AM

## 2018-03-27 NOTE — Anesthesia Preprocedure Evaluation (Signed)
Anesthesia Evaluation  Patient identified by MRN, date of birth, ID band Patient awake    Reviewed: Allergy & Precautions, H&P , NPO status , Patient's Chart, lab work & pertinent test results, reviewed documented beta blocker date and time   Airway Mallampati: II   Neck ROM: full    Dental  (+) Poor Dentition   Pulmonary neg pulmonary ROS, sleep apnea ,    Pulmonary exam normal        Cardiovascular Exercise Tolerance: Poor hypertension, On Medications negative cardio ROS Normal cardiovascular exam Rhythm:regular Rate:Normal     Neuro/Psych PSYCHIATRIC DISORDERS Depression Bipolar Disorder  Neuromuscular disease negative neurological ROS  negative psych ROS   GI/Hepatic negative GI ROS, Neg liver ROS, GERD  Medicated,  Endo/Other  negative endocrine ROSdiabetes  Renal/GU negative Renal ROS  negative genitourinary   Musculoskeletal   Abdominal   Peds  Hematology negative hematology ROS (+)   Anesthesia Other Findings Past Medical History: No date: Depression No date: Diabetes mellitus without complication ([place]) 9/32/35: Diabetic peripheral neuropathy ([place]) 03/07/15: Diabetic peripheral neuropathy ([place]) 03/07/15: Diabetic peripheral neuropathy ([facility]) No date: GERD (gastroesophageal reflux disease) 03/07/15: Hypercholesterolemia No date: Hypertension 03/07/15: Obesity 03/07/15: Personality disorder ([place]) 03/07/15: Sinus tachycardia     Comment:  history of 03/07/15: Suicidal thoughts Past Surgical History: 03/07/15: electroconvulsion therapy BMI    Body Mass Index:  45.53 kg/m     Reproductive/Obstetrics negative OB ROS                             Anesthesia Physical  Anesthesia Plan  ASA: III  Anesthesia Plan: General   Post-op Pain Management:    Induction:   PONV Risk Score and Plan:   Airway Management Planned:   Additional Equipment:   Intra-op Plan:    Post-operative Plan:   Informed Consent: I have reviewed the patients History and Physical, chart, labs and discussed the procedure including the risks, benefits and alternatives for the proposed anesthesia with the patient or authorized representative who has indicated his/her understanding and acceptance.   Dental Advisory Given  Plan Discussed with: CRNA  Anesthesia Plan Comments:         Anesthesia Quick Evaluation

## 2018-03-27 NOTE — Anesthesia Post-op Follow-up Note (Signed)
Anesthesia QCDR form completed.        

## 2018-03-28 NOTE — Anesthesia Postprocedure Evaluation (Signed)
Anesthesia Post Note  Patient: Melinda [name]  Procedure(s) Performed: ECT TX  Patient location during evaluation: PACU Anesthesia Type: General Level of consciousness: awake and alert Pain management: pain level controlled Vital Signs Assessment: post-procedure vital signs reviewed and stable Respiratory status: spontaneous breathing, nonlabored ventilation, respiratory function stable and patient connected to nasal cannula oxygen Cardiovascular status: blood pressure returned to baseline and stable Postop Assessment: no apparent nausea or vomiting Anesthetic complications: no     Last Vitals:  Vitals:   03/27/18 1106 03/27/18 1113  BP: 114/83 (!) 141/82  Pulse: 88 85  Resp: (!) 21 (!) 8  Temp: 36.8 C   SpO2: 96%     Last Pain:  Vitals:   03/27/18 1113  TempSrc:   PainSc: 0-No pain                 Martha Clan

## 2018-04-08 ENCOUNTER — Telehealth: Payer: Self-pay

## 2018-04-08 ENCOUNTER — Other Ambulatory Visit: Payer: Self-pay | Admitting: Psychiatry

## 2018-04-10 ENCOUNTER — Encounter: Payer: Self-pay | Admitting: Certified Registered Nurse Anesthetist

## 2018-04-10 ENCOUNTER — Encounter
Admission: RE | Admit: 2018-04-10 | Discharge: 2018-04-10 | Disposition: A | Discharge status: Home / Self Care | Payer: Medicare PPO | Source: Ambulatory Visit | Attending: Psychiatry | Admitting: Psychiatry

## 2018-04-10 DIAGNOSIS — F332 Major depressive disorder, recurrent severe without psychotic features: Secondary | ICD-10-CM | POA: Diagnosis not present

## 2018-04-10 DIAGNOSIS — K219 Gastro-esophageal reflux disease without esophagitis: Secondary | ICD-10-CM | POA: Diagnosis not present

## 2018-04-10 DIAGNOSIS — Z6836 Body mass index (BMI) 36.0-36.9, adult: Secondary | ICD-10-CM | POA: Insufficient documentation

## 2018-04-10 DIAGNOSIS — E669 Obesity, unspecified: Secondary | ICD-10-CM | POA: Diagnosis not present

## 2018-04-10 DIAGNOSIS — R000 Tachycardia, unspecified: Secondary | ICD-10-CM | POA: Insufficient documentation

## 2018-04-10 DIAGNOSIS — F339 Major depressive disorder, recurrent, unspecified: Secondary | ICD-10-CM | POA: Insufficient documentation

## 2018-04-10 DIAGNOSIS — F329 Major depressive disorder, single episode, unspecified: Secondary | ICD-10-CM | POA: Diagnosis not present

## 2018-04-10 DIAGNOSIS — E119 Type 2 diabetes mellitus without complications: Secondary | ICD-10-CM | POA: Insufficient documentation

## 2018-04-10 DIAGNOSIS — F418 Other specified anxiety disorders: Secondary | ICD-10-CM | POA: Insufficient documentation

## 2018-04-10 DIAGNOSIS — E7800 Pure hypercholesterolemia, unspecified: Secondary | ICD-10-CM | POA: Insufficient documentation

## 2018-04-10 DIAGNOSIS — I10 Essential (primary) hypertension: Secondary | ICD-10-CM | POA: Diagnosis not present

## 2018-04-10 DIAGNOSIS — F609 Personality disorder, unspecified: Secondary | ICD-10-CM | POA: Diagnosis not present

## 2018-04-10 LAB — POCT PREGNANCY, URINE: Preg Test, Ur: NEGATIVE

## 2018-04-10 LAB — GLUCOSE, CAPILLARY
Glucose-Capillary: 108 mg/dL — ABNORMAL HIGH (ref 65–99)
Glucose-Capillary: 140 mg/dL — ABNORMAL HIGH (ref 65–99)

## 2018-04-10 MED ORDER — ESMOLOL HCL 100 MG/10ML IV SOLN
INTRAVENOUS | Status: DC | PRN
  Administered 2018-04-10: 20 mg via INTRAVENOUS

## 2018-04-10 MED ORDER — METHOHEXITAL SODIUM 0.5 G IJ SOLR
INTRAMUSCULAR | Status: AC
  Filled 2018-04-10: qty 500

## 2018-04-10 MED ORDER — KETOROLAC TROMETHAMINE 30 MG/ML IJ SOLN
30.0000 mg | Freq: Once | INTRAMUSCULAR | Status: AC
  Administered 2018-04-10: 30 mg via INTRAVENOUS

## 2018-04-10 MED ORDER — ESMOLOL HCL 100 MG/10ML IV SOLN
INTRAVENOUS | Status: AC
  Filled 2018-04-10: qty 10

## 2018-04-10 MED ORDER — GLYCOPYRROLATE 0.2 MG/ML IJ SOLN
INTRAMUSCULAR | Status: AC
Start: 2018-04-10 — End: 2018-04-10
  Administered 2018-04-10: 0.4 mg via INTRAVENOUS
  Filled 2018-04-10: qty 2

## 2018-04-10 MED ORDER — KETOROLAC TROMETHAMINE 30 MG/ML IJ SOLN
INTRAMUSCULAR | Status: AC
  Filled 2018-04-10: qty 1

## 2018-04-10 MED ORDER — METHOHEXITAL SODIUM 100 MG/10ML IV SOSY
PREFILLED_SYRINGE | INTRAVENOUS | Status: DC | PRN
  Administered 2018-04-10: 70 mg via INTRAVENOUS

## 2018-04-10 MED ORDER — SODIUM CHLORIDE 0.9 % IV SOLN
INTRAVENOUS | Status: DC | PRN
  Administered 2018-04-10: 10:00:00 via INTRAVENOUS

## 2018-04-10 MED ORDER — GLYCOPYRROLATE 0.2 MG/ML IJ SOLN
0.4000 mg | Freq: Once | INTRAMUSCULAR | Status: AC
  Administered 2018-04-10: 0.4 mg via INTRAVENOUS

## 2018-04-10 MED ORDER — LABETALOL HCL 5 MG/ML IV SOLN
INTRAVENOUS | Status: AC
  Filled 2018-04-10: qty 4

## 2018-04-10 MED ORDER — LABETALOL HCL 5 MG/ML IV SOLN
INTRAVENOUS | Status: DC | PRN
  Administered 2018-04-10: 20 mg via INTRAVENOUS

## 2018-04-10 MED ORDER — SUCCINYLCHOLINE CHLORIDE 20 MG/ML IJ SOLN
INTRAMUSCULAR | Status: DC | PRN
  Administered 2018-04-10: 100 mg via INTRAVENOUS

## 2018-04-10 MED ORDER — SODIUM CHLORIDE 0.9 % IV SOLN
500.0000 mL | Freq: Once | INTRAVENOUS | Status: AC
  Administered 2018-04-10: 500 mL via INTRAVENOUS

## 2018-04-10 NOTE — H&P (Signed)
Melinda [name] is an 47 y.o. female.   Chief Complaint: Chronic depression.  She has been having nightmares recently.  Nothing particularly out of the ordinary. HPI: Chronic recurrent depression with partial improvement with maintenance ECT and medicine  Past Medical History:  Diagnosis Date  . Depression   . Diabetes mellitus without complication ([place])   . Diabetic peripheral neuropathy ([place]) 03/07/15  . Diabetic peripheral neuropathy ([place]) 03/07/15  . Diabetic peripheral neuropathy ([place]) 03/07/15  . GERD (gastroesophageal reflux disease)   . Hypercholesterolemia 03/07/15  . Hypertension   . Obesity 03/07/15  . Personality disorder ([place]) 03/07/15  . Sinus tachycardia 03/07/15   history of  . Suicidal thoughts 03/07/15    Past Surgical History:  Procedure Laterality Date  . electroconvulsion therapy  03/07/15    Family History  Problem Relation Age of Onset  . Hypertension Father   . Diabetes Mother    Social History:  reports that she has never smoked. She has never used smokeless tobacco. She reports that she does not drink alcohol or use drugs.  Allergies:  Allergies  Allergen Reactions  . Prednisone     Increases blood sugar     (Not in a hospital admission)  Results for orders placed or performed during the hospital encounter of 04/10/18 (from the past 48 hour(s))  Glucose, capillary     Status: Abnormal   Collection Time: 04/10/18  8:08 AM  Result Value Ref Range   Glucose-Capillary 108 (H) 65 - 99 mg/dL  Pregnancy, urine POC     Status: None   Collection Time: 04/10/18  8:09 AM  Result Value Ref Range   Preg Test, Ur NEGATIVE NEGATIVE    Comment:        THE SENSITIVITY OF THIS METHODOLOGY IS >24 mIU/mL    No results found.  Review of Systems  Constitutional: Negative.   HENT: Negative.   Eyes: Negative.   Respiratory: Negative.   Cardiovascular: Negative.   Gastrointestinal: Negative.   Musculoskeletal: Negative.   Skin: Negative.   Neurological:  Negative.   Psychiatric/Behavioral: Positive for depression. Negative for hallucinations, memory loss, substance abuse and suicidal ideas. The patient is not nervous/anxious and does not have insomnia.     Blood pressure (!) 159/84, pulse (!) 105, temperature 97.6 F (36.4 C), temperature source Oral, resp. rate 16, height 5\' 3"  (1.6 m), weight 124.3 kg (274 lb), last menstrual period 03/16/2018, SpO2 98 %. Physical Exam  Nursing note and vitals reviewed. Constitutional: She appears well-developed and well-nourished.  HENT:  Head: Normocephalic and atraumatic.  Eyes: Pupils are equal, round, and reactive to light. Conjunctivae are normal.  Neck: Normal range of motion.  Cardiovascular: Regular rhythm and normal heart sounds.  Respiratory: Effort normal.  GI: Soft.  Musculoskeletal: Normal range of motion.  Neurological: She is alert.  Skin: Skin is warm and dry.  Psychiatric: She has a normal mood and affect. Her behavior is normal. Judgment and thought content normal.     Assessment/Plan Continue treatment plan treatment today follow-up 2 weeks.  [name], MD 04/10/2018, 10:21 AM

## 2018-04-10 NOTE — Anesthesia Post-op Follow-up Note (Signed)
Anesthesia QCDR form completed.        

## 2018-04-10 NOTE — Transfer of Care (Signed)
Immediate Anesthesia Transfer of Care Note  Patient: Melinda [name]  Procedure(s) Performed: ECT TX  Patient Location: PACU  Anesthesia Type:General  Level of Consciousness: drowsy and aggitated  Airway & Oxygen Therapy: Patient Spontanous Breathing  Post-op Assessment: Report given to RN and Patient moving all extremities  Post vital signs: Reviewed and stable  Last Vitals:  Vitals Value Taken Time  BP    Temp    Pulse 103 04/10/2018 10:28 AM  Resp 28 04/10/2018 10:28 AM  SpO2 92 % 04/10/2018 10:28 AM  Vitals shown include unvalidated device data.  Last Pain:  Vitals:   04/10/18 0820  TempSrc: Oral  PainSc: 0-No pain         Complications: No apparent anesthesia complications

## 2018-04-10 NOTE — Anesthesia Postprocedure Evaluation (Signed)
Anesthesia Post Note  Patient: Melinda [name]  Procedure(s) Performed: ECT TX  Patient location during evaluation: PACU Anesthesia Type: General Level of consciousness: awake and alert Pain management: pain level controlled Vital Signs Assessment: post-procedure vital signs reviewed and stable Respiratory status: spontaneous breathing, nonlabored ventilation, respiratory function stable and patient connected to nasal cannula oxygen Cardiovascular status: blood pressure returned to baseline and stable Postop Assessment: no apparent nausea or vomiting Anesthetic complications: no     Last Vitals:  Vitals:   04/10/18 1052 04/10/18 1104  BP: 134/87 134/82  Pulse: 93 80  Resp: 18 16  Temp: 37.3 C 36.8 C  SpO2: 96%     Last Pain:  Vitals:   04/10/18 1104  TempSrc: Oral  PainSc:                  Precious Haws Beckham Buxbaum

## 2018-04-10 NOTE — Procedures (Signed)
ECT SERVICES Physician's Interval Evaluation & Treatment Note  Patient Identification: Melinda [name] MRN:  [id number] Date of Evaluation:  04/10/2018 TX #: 299  MADRS:   MMSE:   P.E. Findings:  No change to physical exam  Psychiatric Interval Note:  Chronic low mood anxiety  Subjective:  Patient is a 47 y.o. female seen for evaluation for Electroconvulsive Therapy. Bad dreams but general mood normal no change to anything else as far as cognition or mood  Treatment Summary:   []   Right Unilateral             [x]  Bilateral   % Energy : 1.0 ms, 35%   Impedance: 1180 ohms  Seizure Energy Index: 8925 V squared 86%  Postictal Suppression Index: 86%  Seizure Concordance Index: 95%  Medications  Pre Shock: Robinul 0.4 mg Toradol 30 mg Brevital 70 mg labetalol 20 mg esmolol 10 mg succinylcholine 100 mg  Post Shock:    Seizure Duration: 46 seconds by EMG 95 seconds by EEG   Comments: Follow-up 2 weeks  Lungs:  [x]   Clear to auscultation               []  Other:   Heart:    [x]   Regular rhythm             []  irregular rhythm    [x]   Previous H&P reviewed, patient examined and there are NO CHANGES                 []   Previous H&P reviewed, patient examined and there are changes noted.   [name], MD 5/31/201910:23 AM

## 2018-04-10 NOTE — Anesthesia Preprocedure Evaluation (Signed)
Anesthesia Evaluation  Patient identified by MRN, date of birth, ID band Patient awake    Reviewed: Allergy & Precautions, H&P , NPO status , Patient's Chart, lab work & pertinent test results  History of Anesthesia Complications Negative for: history of anesthetic complications  Airway Mallampati: II  TM Distance: >3 FB Neck ROM: full    Dental  (+) Poor Dentition, Chipped   Pulmonary sleep apnea , neg COPD,    Pulmonary exam normal breath sounds clear to auscultation       Cardiovascular hypertension, Pt. on medications (-) CAD and (-) Past MI negative cardio ROS Normal cardiovascular exam Rhythm:regular Rate:Normal     Neuro/Psych PSYCHIATRIC DISORDERS Depression Bipolar Disorder  Neuromuscular disease negative neurological ROS     GI/Hepatic negative GI ROS, Neg liver ROS, GERD  Controlled,  Endo/Other  diabetes, Type 2, Oral Hypoglycemic Agents  Renal/GU negative Renal ROS  negative genitourinary   Musculoskeletal   Abdominal (+) + obese,   Peds  Hematology negative hematology ROS (+)   Anesthesia Other Findings Past Medical History: No date: Depression No date: Diabetes mellitus without complication ([facility]) 03/07/15: Diabetic peripheral neuropathy ([facility]) 03/07/15: Diabetic peripheral neuropathy ([facility]) 03/07/15: Diabetic peripheral neuropathy ([facility]) No date: GERD (gastroesophageal reflux disease) 03/07/15: Hypercholesterolemia No date: Hypertension 03/07/15: Obesity 03/07/15: Personality disorder 03/07/15: Sinus tachycardia ([facility])     Comment: history of 03/07/15: Suicidal thoughts Past Surgical History: 03/07/15: electroconvulsion therapy BMI    Body Mass Index:  36.44 kg/m     Reproductive/Obstetrics                             Anesthesia Physical  Anesthesia Plan  ASA: III  Anesthesia Plan: General   Post-op Pain Management:    Induction: Intravenous  PONV Risk Score and  Plan:   Airway Management Planned: Mask  Additional Equipment:   Intra-op Plan:   Post-operative Plan:   Informed Consent: I have reviewed the patients History and Physical, chart, labs and discussed the procedure including the risks, benefits and alternatives for the proposed anesthesia with the patient or authorized representative who has indicated his/her understanding and acceptance.   Dental Advisory Given  Plan Discussed with: CRNA and Anesthesiologist  Anesthesia Plan Comments: (Patient consented for risks of anesthesia including but not limited to:  - adverse reactions to medications - damage to teeth, lips or other oral mucosa - sore throat or hoarseness - Damage to heart, brain, lungs or loss of life  Patient voiced understanding.)        Anesthesia Quick Evaluation  

## 2018-04-10 NOTE — Anesthesia Procedure Notes (Signed)
Date/Time: 04/10/2018 10:09 AM Performed by: Johnna Acosta, CRNA Pre-anesthesia Checklist: Patient identified, Emergency Drugs available, Suction available, Patient being monitored and Timeout performed Oxygen Delivery Method: Circle system utilized Preoxygenation: Pre-oxygenation with 100% oxygen Induction Type: IV induction Ventilation: Mask ventilation without difficulty Dental Injury: Teeth and Oropharynx as per pre-operative assessment

## 2018-04-10 NOTE — Discharge Instructions (Signed)
1)  The drugs that you have been given will stay in your system until tomorrow so for the       next 24 hours you should not:  A. Drive an automobile  B. Make any legal decisions  C. Drink any alcoholic beverages  2)  You may resume your regular meals upon return home.  3)  A responsible adult must take you home.  Someone should stay with you for a few          hours, then be available by phone for the remainder of the treatment day.  4)  You May experience any of the following symptoms:  Headache, Nausea and a dry mouth (due to the medications you were given),  temporary memory loss and some confusion, or sore muscles (a warm bath  should help this).  If you you experience any of these symptoms let us know on                your return visit.  5)  Report any of the following: any acute discomfort, severe headache, or temperature        greater than 100.5 F.   Also report any unusual redness, swelling, drainage, or pain         at your IV site.    You may report Symptoms to:  [place] at [facility]          Phone: [phone], ECT Department           or Dr. Prescott Gum office [phone]  6)  Your next ECT Treatment is Friday June 14  We will call 2 days prior to your scheduled appointment for arrival times.  7)  Nothing to eat or drink after midnight the night before your procedure.  8)  Take     With a sip of water the morning of your procedure.  9)  Other Instructions: Call [phone] to cancel the morning of your procedure due         to illness or emergency.  10) We will call within 72 hours to assess how you are feeling.

## 2018-04-13 ENCOUNTER — Other Ambulatory Visit: Payer: Self-pay | Admitting: Psychiatry

## 2018-04-15 ENCOUNTER — Telehealth (HOSPITAL_COMMUNITY): Payer: Self-pay | Admitting: *Deleted

## 2018-04-15 NOTE — Telephone Encounter (Signed)
Called for extension of authorization days. Was given 12 sessions until 03/2018, but patient has not used all 12 before the date. Called [employer] spoke with Chrissie Noa who extended the date until 06/10/2018.

## 2018-04-22 ENCOUNTER — Telehealth: Payer: Self-pay

## 2018-04-24 ENCOUNTER — Telehealth: Payer: Self-pay | Admitting: *Deleted

## 2018-04-26 ENCOUNTER — Other Ambulatory Visit: Payer: Self-pay | Admitting: Psychiatry

## 2018-04-27 ENCOUNTER — Encounter: Payer: Self-pay | Admitting: Anesthesiology

## 2018-04-27 ENCOUNTER — Encounter
Admission: RE | Admit: 2018-04-27 | Discharge: 2018-04-27 | Disposition: A | Discharge status: Home / Self Care | Payer: Medicare PPO | Source: Ambulatory Visit | Attending: Psychiatry | Admitting: Psychiatry

## 2018-04-27 DIAGNOSIS — Z79899 Other long term (current) drug therapy: Secondary | ICD-10-CM | POA: Diagnosis not present

## 2018-04-27 DIAGNOSIS — E7800 Pure hypercholesterolemia, unspecified: Secondary | ICD-10-CM | POA: Diagnosis not present

## 2018-04-27 DIAGNOSIS — G4730 Sleep apnea, unspecified: Secondary | ICD-10-CM | POA: Diagnosis not present

## 2018-04-27 DIAGNOSIS — F332 Major depressive disorder, recurrent severe without psychotic features: Secondary | ICD-10-CM

## 2018-04-27 DIAGNOSIS — I10 Essential (primary) hypertension: Secondary | ICD-10-CM | POA: Diagnosis not present

## 2018-04-27 DIAGNOSIS — F333 Major depressive disorder, recurrent, severe with psychotic symptoms: Secondary | ICD-10-CM | POA: Diagnosis not present

## 2018-04-27 DIAGNOSIS — E1140 Type 2 diabetes mellitus with diabetic neuropathy, unspecified: Secondary | ICD-10-CM | POA: Diagnosis not present

## 2018-04-27 LAB — GLUCOSE, CAPILLARY
Glucose-Capillary: 115 mg/dL — ABNORMAL HIGH (ref 65–99)
Glucose-Capillary: 137 mg/dL — ABNORMAL HIGH (ref 65–99)

## 2018-04-27 LAB — POCT PREGNANCY, URINE: Preg Test, Ur: NEGATIVE

## 2018-04-27 MED ORDER — METHOHEXITAL SODIUM 100 MG/10ML IV SOSY
PREFILLED_SYRINGE | INTRAVENOUS | Status: DC | PRN
  Administered 2018-04-27: 70 mg via INTRAVENOUS

## 2018-04-27 MED ORDER — LABETALOL HCL 5 MG/ML IV SOLN
INTRAVENOUS | Status: AC
  Filled 2018-04-27: qty 4

## 2018-04-27 MED ORDER — SODIUM CHLORIDE 0.9 % IV SOLN
500.0000 mL | Freq: Once | INTRAVENOUS | Status: AC
  Administered 2018-04-27: 500 mL via INTRAVENOUS

## 2018-04-27 MED ORDER — GLYCOPYRROLATE 0.2 MG/ML IJ SOLN
INTRAMUSCULAR | Status: AC
  Administered 2018-04-27: 0.4 mg via INTRAVENOUS
  Filled 2018-04-27: qty 2

## 2018-04-27 MED ORDER — ESMOLOL HCL 100 MG/10ML IV SOLN
INTRAVENOUS | Status: AC
  Filled 2018-04-27: qty 10

## 2018-04-27 MED ORDER — SODIUM CHLORIDE 0.9 % IV SOLN
INTRAVENOUS | Status: DC | PRN
  Administered 2018-04-27: 10:00:00 via INTRAVENOUS

## 2018-04-27 MED ORDER — PROMETHAZINE HCL 25 MG/ML IJ SOLN: 6.2500 mg | INTRAMUSCULAR | Status: DC | PRN

## 2018-04-27 MED ORDER — SUCCINYLCHOLINE CHLORIDE 20 MG/ML IJ SOLN
INTRAMUSCULAR | Status: AC
  Filled 2018-04-27: qty 1

## 2018-04-27 MED ORDER — LABETALOL HCL 5 MG/ML IV SOLN
INTRAVENOUS | Status: DC | PRN
  Administered 2018-04-27: 20 mg via INTRAVENOUS

## 2018-04-27 MED ORDER — GLYCOPYRROLATE 0.2 MG/ML IJ SOLN
0.4000 mg | Freq: Once | INTRAMUSCULAR | Status: AC
  Administered 2018-04-27: 0.4 mg via INTRAVENOUS

## 2018-04-27 MED ORDER — KETOROLAC TROMETHAMINE 30 MG/ML IJ SOLN
30.0000 mg | Freq: Once | INTRAMUSCULAR | Status: AC
  Administered 2018-04-27: 30 mg via INTRAVENOUS

## 2018-04-27 MED ORDER — METHOHEXITAL SODIUM 0.5 G IJ SOLR
INTRAMUSCULAR | Status: AC
  Filled 2018-04-27: qty 500

## 2018-04-27 MED ORDER — KETOROLAC TROMETHAMINE 30 MG/ML IJ SOLN
INTRAMUSCULAR | Status: AC
  Administered 2018-04-27: 30 mg via INTRAVENOUS
  Filled 2018-04-27: qty 1

## 2018-04-27 MED ORDER — ESMOLOL HCL 100 MG/10ML IV SOLN
INTRAVENOUS | Status: DC | PRN
  Administered 2018-04-27: 20 mg via INTRAVENOUS

## 2018-04-27 MED ORDER — SUCCINYLCHOLINE CHLORIDE 200 MG/10ML IV SOSY
PREFILLED_SYRINGE | INTRAVENOUS | Status: DC | PRN
  Administered 2018-04-27: 100 mg via INTRAVENOUS

## 2018-04-27 NOTE — Anesthesia Preprocedure Evaluation (Addendum)
Anesthesia Evaluation  Patient identified by MRN, date of birth, ID band Patient awake    Reviewed: Allergy & Precautions, H&P , NPO status , reviewed documented beta blocker date and time   Airway Mallampati: II  TM Distance: >3 FB Neck ROM: full    Dental  (+) Chipped, Missing   Pulmonary sleep apnea ,    Pulmonary exam normal        Cardiovascular hypertension, Normal cardiovascular exam     Neuro/Psych PSYCHIATRIC DISORDERS Depression Bipolar Disorder  Neuromuscular disease    GI/Hepatic GERD  Controlled,  Endo/Other  diabetes  Renal/GU      Musculoskeletal   Abdominal   Peds  Hematology   Anesthesia Other Findings Past Medical History: No date: Depression No date: Diabetes mellitus without complication (HCC) 03/07/15: Diabetic peripheral neuropathy (HCC) 03/07/15: Diabetic peripheral neuropathy (HCC) 03/07/15: Diabetic peripheral neuropathy (HCC) No date: GERD (gastroesophageal reflux disease) 03/07/15: Hypercholesterolemia No date: Hypertension 03/07/15: Obesity 03/07/15: Personality disorder (HCC) 03/07/15: Sinus tachycardia     Comment:  history of 03/07/15: Suicidal thoughts  Past Surgical History: 03/07/15: electroconvulsion therapy  BMI    Body Mass Index:  48.36 kg/m      Reproductive/Obstetrics                             Anesthesia Physical  Anesthesia Plan  ASA: III  Anesthesia Plan: General   Post-op Pain Management:    Induction:   PONV Risk Score and Plan: 3 and Treatment may vary due to age or medical condition and TIVA  Airway Management Planned:   Additional Equipment:   Intra-op Plan:   Post-operative Plan:   Informed Consent: I have reviewed the patients History and Physical, chart, labs and discussed the procedure including the risks, benefits and alternatives for the proposed anesthesia with the patient or authorized representative who has  indicated his/her understanding and acceptance.   Dental Advisory Given  Plan Discussed with: CRNA  Anesthesia Plan Comments:         Anesthesia Quick Evaluation  

## 2018-04-27 NOTE — Discharge Instructions (Signed)
1)  The drugs that you have been given will stay in your system until tomorrow so for the       next 24 hours you should not:  A. Drive an automobile  B. Make any legal decisions  C. Drink any alcoholic beverages  2)  You may resume your regular meals upon return home.  3)  A responsible adult must take you home.  Someone should stay with you for a few          hours, then be available by phone for the remainder of the treatment day.  4)  You May experience any of the following symptoms:  Headache, Nausea and a dry mouth (due to the medications you were given),  temporary memory loss and some confusion, or sore muscles (a warm bath  should help this).  If you you experience any of these symptoms let us know on                your return visit.  5)  Report any of the following: any acute discomfort, severe headache, or temperature        greater than 100.5 F.   Also report any unusual redness, swelling, drainage, or pain         at your IV site.    You may report Symptoms to:  [place] at [facility]          Phone: [phone], ECT Department           or Dr. Prescott Gum office [phone]  6)  Your next ECT Treatment is Day Friday  Date May 08, 2018  We will call 2 days prior to your scheduled appointment for arrival times.  7)  Nothing to eat or drink after midnight the night before your procedure.  8)  Take .    With a sip of water the morning of your procedure.  9)  Other Instructions: Call [phone] to cancel the morning of your procedure due         to illness or emergency.  10) We will call within 72 hours to assess how you are feeling.

## 2018-04-27 NOTE — Anesthesia Procedure Notes (Signed)
Date/Time: 04/27/2018 10:20 AM Performed by: Dionne Bucy, CRNA Pre-anesthesia Checklist: Patient identified, Emergency Drugs available, Suction available and Patient being monitored Patient Re-evaluated:Patient Re-evaluated prior to induction Oxygen Delivery Method: Circle system utilized Preoxygenation: Pre-oxygenation with 100% oxygen Induction Type: IV induction Ventilation: Mask ventilation without difficulty and Mask ventilation throughout procedure Airway Equipment and Method: Bite block Placement Confirmation: positive ETCO2 Dental Injury: Teeth and Oropharynx as per pre-operative assessment

## 2018-04-27 NOTE — Anesthesia Postprocedure Evaluation (Signed)
Anesthesia Post Note  Patient: Melinda [name]  Procedure(s) Performed: ECT TX  Patient location during evaluation: PACU Anesthesia Type: General Level of consciousness: awake and alert Pain management: pain level controlled Vital Signs Assessment: post-procedure vital signs reviewed and stable Respiratory status: spontaneous breathing, nonlabored ventilation and respiratory function stable Cardiovascular status: blood pressure returned to baseline and stable Postop Assessment: no apparent nausea or vomiting Anesthetic complications: no     Last Vitals:  Vitals:   04/27/18 1054 04/27/18 1102  BP:  (!) 144/85  Pulse: 87 83  Resp: 17 17  Temp: 37 C   SpO2: 97%     Last Pain:  Vitals:   04/27/18 1102  TempSrc:   PainSc: 1                  Jaxyn Rout Harvie Heck

## 2018-04-27 NOTE — H&P (Signed)
Melinda [name] is an 47 y.o. female.   Chief Complaint: Patient has chronic depression.  Also having a lot of bad dreams. HPI: History of recurrent severe depression  Past Medical History:  Diagnosis Date  . Depression   . Diabetes mellitus without complication ([place])   . Diabetic peripheral neuropathy ([place]) 03/07/15  . Diabetic peripheral neuropathy ([place]) 03/07/15  . Diabetic peripheral neuropathy ([place]) 03/07/15  . GERD (gastroesophageal reflux disease)   . Hypercholesterolemia 03/07/15  . Hypertension   . Obesity 03/07/15  . Personality disorder ([place]) 03/07/15  . Sinus tachycardia 03/07/15   history of  . Suicidal thoughts 03/07/15    Past Surgical History:  Procedure Laterality Date  . electroconvulsion therapy  03/07/15    Family History  Problem Relation Age of Onset  . Hypertension Father   . Diabetes Mother    Social History:  reports that she has never smoked. She has never used smokeless tobacco. She reports that she does not drink alcohol or use drugs.  Allergies:  Allergies  Allergen Reactions  . Prednisone     Increases blood sugar     (Not in a hospital admission)  Results for orders placed or performed during the hospital encounter of 04/27/18 (from the past 48 hour(s))  Glucose, capillary     Status: Abnormal   Collection Time: 04/27/18  8:04 AM  Result Value Ref Range   Glucose-Capillary 115 (H) 65 - 99 mg/dL   Comment 1 Notify RN   Pregnancy, urine POC     Status: None   Collection Time: 04/27/18  8:04 AM  Result Value Ref Range   Preg Test, Ur NEGATIVE NEGATIVE    Comment:        THE SENSITIVITY OF THIS METHODOLOGY IS >24 mIU/mL    No results found.  Review of Systems  Constitutional: Negative.   HENT: Negative.   Eyes: Negative.   Respiratory: Negative.   Cardiovascular: Negative.   Gastrointestinal: Negative.   Musculoskeletal: Negative.   Skin: Negative.   Neurological: Negative.   Psychiatric/Behavioral: Positive for depression.  Negative for hallucinations, memory loss, substance abuse and suicidal ideas. The patient is not nervous/anxious and does not have insomnia.     Blood pressure (!) 184/76, pulse 96, temperature (!) 97.3 F (36.3 C), temperature source Oral, resp. rate 16, height 5\' 3"  (1.6 m), weight 123.8 kg (273 lb), SpO2 100 %. Physical Exam  Nursing note and vitals reviewed. Constitutional: She appears well-developed and well-nourished.  HENT:  Head: Normocephalic and atraumatic.  Eyes: Pupils are equal, round, and reactive to light. Conjunctivae are normal.  Neck: Normal range of motion.  Cardiovascular: Regular rhythm and normal heart sounds.  Respiratory: Effort normal. No respiratory distress.  GI: Soft.  Musculoskeletal: Normal range of motion.  Neurological: She is alert.  Skin: Skin is warm and dry.  Psychiatric: Her speech is normal and behavior is normal. Judgment and thought content normal. Cognition and memory are normal. She exhibits a depressed mood.     Assessment/Plan ECT today with follow-up treatment to be scheduled on 28 June  [name], MD 04/27/2018, 10:06 AM

## 2018-04-27 NOTE — Procedures (Signed)
ECT SERVICES Physician's Interval Evaluation & Treatment Note  Patient Identification: LIZZETH MEDER MRN:  [id number] Date of Evaluation:  04/27/2018 TX #: 300  MADRS:   MMSE:   P.E. Findings:  No change to physical exam  Psychiatric Interval Note:  Chronic depression and anxiety  Subjective:  Patient is a 47 y.o. female seen for evaluation for Electroconvulsive Therapy. A little bit worse than baseline in some ways  Treatment Summary:   []   Right Unilateral             [x]  Bilateral   % Energy : 1.0 ms 35%   Impedance: 1430 ohms  Seizure Energy Index: 10,113 V squared  Postictal Suppression Index: 86%  Seizure Concordance Index: 97%  Medications  Pre Shock: Robinul 0.4 mg Toradol 30 mg labetalol 20 mg esmolol 10 mg Brevital 70 mg succinylcholine 100 mg  Post Shock:    Seizure Duration: 41 seconds by EMG 87 seconds by EEG   Comments: Follow-up on the 28th  Lungs:  [x]   Clear to auscultation               []  Other:   Heart:    [x]   Regular rhythm             []  irregular rhythm    [x]   Previous H&P reviewed, patient examined and there are NO CHANGES                 []   Previous H&P reviewed, patient examined and there are changes noted.   [name], MD 6/17/201910:08 AM

## 2018-04-27 NOTE — Transfer of Care (Signed)
Immediate Anesthesia Transfer of Care Note  Patient: Melinda [name]  Procedure(s) Performed: ECT TX  Patient Location: PACU  Anesthesia Type:General  Level of Consciousness: sedated  Airway & Oxygen Therapy: Patient Spontanous Breathing and Patient connected to face mask oxygen  Post-op Assessment: Report given to RN and Post -op Vital signs reviewed and stable  Post vital signs: Reviewed and stable  Last Vitals:  Vitals Value Taken Time  BP 151/131 04/27/2018 10:31 AM  Temp    Pulse 102 04/27/2018 10:31 AM  Resp 26 04/27/2018 10:31 AM  SpO2 90 % 04/27/2018 10:31 AM  Vitals shown include unvalidated device data.  Last Pain:  Vitals:   04/27/18 0815  TempSrc: Oral  PainSc: 0-No pain         Complications: No apparent anesthesia complications

## 2018-04-27 NOTE — Anesthesia Post-op Follow-up Note (Signed)
Anesthesia QCDR form completed.        

## 2018-05-04 ENCOUNTER — Telehealth: Payer: Self-pay | Admitting: *Deleted

## 2018-05-08 ENCOUNTER — Encounter: Payer: Self-pay | Admitting: Anesthesiology

## 2018-05-08 ENCOUNTER — Encounter (HOSPITAL_BASED_OUTPATIENT_CLINIC_OR_DEPARTMENT_OTHER)
Admission: RE | Admit: 2018-05-08 | Discharge: 2018-05-08 | Disposition: A | Discharge status: Home / Self Care | Payer: Medicare PPO | Source: Ambulatory Visit | Attending: Psychiatry | Admitting: Psychiatry

## 2018-05-08 ENCOUNTER — Other Ambulatory Visit: Payer: Self-pay | Admitting: Psychiatry

## 2018-05-08 ENCOUNTER — Telehealth (HOSPITAL_COMMUNITY): Payer: Self-pay | Admitting: *Deleted

## 2018-05-08 DIAGNOSIS — G4730 Sleep apnea, unspecified: Secondary | ICD-10-CM | POA: Diagnosis not present

## 2018-05-08 DIAGNOSIS — E1140 Type 2 diabetes mellitus with diabetic neuropathy, unspecified: Secondary | ICD-10-CM | POA: Diagnosis not present

## 2018-05-08 DIAGNOSIS — Z79899 Other long term (current) drug therapy: Secondary | ICD-10-CM | POA: Diagnosis not present

## 2018-05-08 DIAGNOSIS — E7800 Pure hypercholesterolemia, unspecified: Secondary | ICD-10-CM | POA: Diagnosis not present

## 2018-05-08 DIAGNOSIS — F333 Major depressive disorder, recurrent, severe with psychotic symptoms: Secondary | ICD-10-CM | POA: Diagnosis not present

## 2018-05-08 DIAGNOSIS — F332 Major depressive disorder, recurrent severe without psychotic features: Secondary | ICD-10-CM | POA: Diagnosis not present

## 2018-05-08 DIAGNOSIS — K219 Gastro-esophageal reflux disease without esophagitis: Secondary | ICD-10-CM | POA: Diagnosis not present

## 2018-05-08 DIAGNOSIS — I10 Essential (primary) hypertension: Secondary | ICD-10-CM | POA: Diagnosis not present

## 2018-05-08 MED ORDER — ONDANSETRON HCL 4 MG/2ML IJ SOLN: 4.0000 mg | Freq: Once | INTRAMUSCULAR | Status: DC | PRN

## 2018-05-08 MED ORDER — LABETALOL HCL 5 MG/ML IV SOLN
INTRAVENOUS | Status: DC | PRN
  Administered 2018-05-08: 20 mg via INTRAVENOUS

## 2018-05-08 MED ORDER — FENTANYL CITRATE (PF) 100 MCG/2ML IJ SOLN: 25.0000 ug | INTRAMUSCULAR | Status: DC | PRN

## 2018-05-08 MED ORDER — GLYCOPYRROLATE 0.2 MG/ML IJ SOLN
INTRAMUSCULAR | Status: AC
  Filled 2018-05-08: qty 2

## 2018-05-08 MED ORDER — ESMOLOL HCL 100 MG/10ML IV SOLN
INTRAVENOUS | Status: DC | PRN
  Administered 2018-05-08: 20 mg via INTRAVENOUS

## 2018-05-08 MED ORDER — KETOROLAC TROMETHAMINE 30 MG/ML IJ SOLN
INTRAMUSCULAR | Status: AC
  Filled 2018-05-08: qty 1

## 2018-05-08 MED ORDER — KETOROLAC TROMETHAMINE 30 MG/ML IJ SOLN
30.0000 mg | Freq: Once | INTRAMUSCULAR | Status: AC
  Administered 2018-05-08: 30 mg via INTRAVENOUS

## 2018-05-08 MED ORDER — METHOHEXITAL SODIUM 100 MG/10ML IV SOSY
PREFILLED_SYRINGE | INTRAVENOUS | Status: DC | PRN
  Administered 2018-05-08: 70 mg via INTRAVENOUS

## 2018-05-08 MED ORDER — SUCCINYLCHOLINE CHLORIDE 20 MG/ML IJ SOLN
INTRAMUSCULAR | Status: DC | PRN
  Administered 2018-05-08: 100 mg via INTRAVENOUS

## 2018-05-08 MED ORDER — GLYCOPYRROLATE 0.2 MG/ML IJ SOLN
0.4000 mg | Freq: Once | INTRAMUSCULAR | Status: AC
  Administered 2018-05-08: 0.4 mg via INTRAVENOUS

## 2018-05-08 MED ORDER — SODIUM CHLORIDE 0.9 % IV SOLN
500.0000 mL | Freq: Once | INTRAVENOUS | Status: AC
  Administered 2018-05-08: 500 mL via INTRAVENOUS

## 2018-05-08 NOTE — Anesthesia Procedure Notes (Signed)
Performed by: Mykale Gandolfo, CRNA Pre-anesthesia Checklist: Patient identified, Emergency Drugs available, Suction available and Patient being monitored Patient Re-evaluated:Patient Re-evaluated prior to induction Oxygen Delivery Method: Circle system utilized Preoxygenation: Pre-oxygenation with 100% oxygen Induction Type: IV induction Ventilation: Mask ventilation without difficulty and Mask ventilation throughout procedure Airway Equipment and Method: Bite block Placement Confirmation: positive ETCO2 Dental Injury: Teeth and Oropharynx as per pre-operative assessment        

## 2018-05-08 NOTE — Transfer of Care (Signed)
Immediate Anesthesia Transfer of Care Note  Patient: Melinda [name]  Procedure(s) Performed: ECT TX  Patient Location: PACU  Anesthesia Type:General  Level of Consciousness: sedated and responds to stimulation  Airway & Oxygen Therapy: Patient Spontanous Breathing and Patient connected to face mask oxygen  Post-op Assessment: Report given to RN and Post -op Vital signs reviewed and stable  Post vital signs: Reviewed and stable  Last Vitals:  Vitals Value Taken Time  BP    Temp 37.1 C 05/08/2018 10:27 AM  Pulse 103 05/08/2018 10:29 AM  Resp 22 05/08/2018 10:29 AM  SpO2 99 % 05/08/2018 10:29 AM  Vitals shown include unvalidated device data.  Last Pain: There were no vitals filed for this visit.       Complications: No apparent anesthesia complications

## 2018-05-08 NOTE — Discharge Instructions (Signed)
1)  The drugs that you have been given will stay in your system until tomorrow so for the       next 24 hours you should not:  A. Drive an automobile  B. Make any legal decisions  C. Drink any alcoholic beverages  2)  You may resume your regular meals upon return home.  3)  A responsible adult must take you home.  Someone should stay with you for a few          hours, then be available by phone for the remainder of the treatment day.  4)  You May experience any of the following symptoms:  Headache, Nausea and a dry mouth (due to the medications you were given),  temporary memory loss and some confusion, or sore muscles (a warm bath  should help this).  If you you experience any of these symptoms let us know on                your return visit.  5)  Report any of the following: any acute discomfort, severe headache, or temperature        greater than 100.5 F.   Also report any unusual redness, swelling, drainage, or pain         at your IV site.    You may report Symptoms to:  [place] at [facility]          Phone: [phone], ECT Department           or Dr. Prescott Gum office [phone]  6)  Your next ECT Treatment is Friday  , July 12 at 8:00  We will call 2 days prior to your scheduled appointment for arrival times.  7)  Nothing to eat or drink after midnight the night before your procedure.  8)  Take     With a sip of water the morning of your procedure.  9)  Other Instructions: Call [phone] to cancel the morning of your procedure due         to illness or emergency.  10) We will call within 72 hours to assess how you are feeling.

## 2018-05-08 NOTE — Anesthesia Post-op Follow-up Note (Signed)
Anesthesia QCDR form completed.        

## 2018-05-08 NOTE — Telephone Encounter (Signed)
Called for concurrent review of outpatient ECT. Spoke with Erasmo Downer C who gave 6 units from 05/04/18-11/10/18 with Josem Kaufmann #[phone].

## 2018-05-08 NOTE — H&P (Signed)
Melinda [name] is an 47 y.o. female.   Chief Complaint: 47 year old woman with chronic severe depression.  Last few days has been more down but not suicidal not psychotic. HPI: History of long-standing chronic and recurrent depression with some response to maintenance ECT  Past Medical History:  Diagnosis Date  . Depression   . Diabetes mellitus without complication ([place])   . Diabetic peripheral neuropathy ([place]) 03/07/15  . Diabetic peripheral neuropathy ([place]) 03/07/15  . Diabetic peripheral neuropathy ([place]) 03/07/15  . GERD (gastroesophageal reflux disease)   . Hypercholesterolemia 03/07/15  . Hypertension   . Obesity 03/07/15  . Personality disorder ([place]) 03/07/15  . Sinus tachycardia 03/07/15   history of  . Suicidal thoughts 03/07/15    Past Surgical History:  Procedure Laterality Date  . electroconvulsion therapy  03/07/15    Family History  Problem Relation Age of Onset  . Hypertension Father   . Diabetes Mother    Social History:  reports that she has never smoked. She has never used smokeless tobacco. She reports that she does not drink alcohol or use drugs.  Allergies:  Allergies  Allergen Reactions  . Prednisone     Increases blood sugar     (Not in a hospital admission)  No results found for this or any previous visit (from the past 48 hour(s)). No results found.  Review of Systems  Constitutional: Negative.   HENT: Negative.   Eyes: Negative.   Respiratory: Negative.   Cardiovascular: Negative.   Gastrointestinal: Negative.   Musculoskeletal: Negative.   Skin: Negative.   Neurological: Negative.   Psychiatric/Behavioral: Positive for depression. Negative for hallucinations, memory loss, substance abuse and suicidal ideas. The patient is nervous/anxious and has insomnia.     There were no vitals taken for this visit. Physical Exam  Nursing note and vitals reviewed. Constitutional: She appears well-developed and well-nourished.  HENT:  Head:  Normocephalic and atraumatic.  Eyes: Pupils are equal, round, and reactive to light. Conjunctivae are normal.  Neck: Normal range of motion.  Cardiovascular: Regular rhythm and normal heart sounds.  Respiratory: Effort normal. No respiratory distress.  GI: Soft.  Musculoskeletal: Normal range of motion.  Neurological: She is alert.  Skin: Skin is warm and dry.  Psychiatric: Judgment normal. Her affect is blunt. Her speech is delayed. She is slowed. Cognition and memory are normal. She expresses no suicidal ideation.     Assessment/Plan Treatment today supportive counseling completed.  Review medicine.  Follow-up 2 weeks.  [name], MD 05/08/2018, 9:46 AM

## 2018-05-08 NOTE — Anesthesia Preprocedure Evaluation (Signed)
Anesthesia Evaluation  Patient identified by MRN, date of birth, ID band Patient awake    Reviewed: Allergy & Precautions, H&P , NPO status , reviewed documented beta blocker date and time   Airway Mallampati: II  TM Distance: >3 FB Neck ROM: full    Dental  (+) Chipped, Missing   Pulmonary sleep apnea ,    Pulmonary exam normal        Cardiovascular hypertension, Normal cardiovascular exam     Neuro/Psych PSYCHIATRIC DISORDERS Depression Bipolar Disorder  Neuromuscular disease    GI/Hepatic GERD  Controlled,  Endo/Other  diabetes  Renal/GU      Musculoskeletal   Abdominal   Peds  Hematology   Anesthesia Other Findings Past Medical History: No date: Depression No date: Diabetes mellitus without complication ([place]) 03/06/82: Diabetic peripheral neuropathy ([place]) 03/07/15: Diabetic peripheral neuropathy ([place]) 03/07/15: Diabetic peripheral neuropathy (HCC) No date: GERD (gastroesophageal reflux disease) 03/07/15: Hypercholesterolemia No date: Hypertension 03/07/15: Obesity 03/07/15: Personality disorder ([place]) 03/07/15: Sinus tachycardia     Comment:  history of 03/07/15: Suicidal thoughts  Past Surgical History: 03/07/15: electroconvulsion therapy  BMI    Body Mass Index:  48.36 kg/m      Reproductive/Obstetrics                             Anesthesia Physical  Anesthesia Plan  ASA: III  Anesthesia Plan: General   Post-op Pain Management:    Induction:   PONV Risk Score and Plan: 3 and Treatment may vary due to age or medical condition and TIVA  Airway Management Planned:   Additional Equipment:   Intra-op Plan:   Post-operative Plan:   Informed Consent: I have reviewed the patients History and Physical, chart, labs and discussed the procedure including the risks, benefits and alternatives for the proposed anesthesia with the patient or authorized representative who has  indicated his/her understanding and acceptance.   Dental Advisory Given  Plan Discussed with: CRNA  Anesthesia Plan Comments:         Anesthesia Quick Evaluation

## 2018-05-08 NOTE — Anesthesia Postprocedure Evaluation (Signed)
Anesthesia Post Note  Patient: Melinda [name]  Procedure(s) Performed: ECT TX  Patient location during evaluation: PACU Anesthesia Type: General Level of consciousness: awake and alert and oriented Pain management: pain level controlled Vital Signs Assessment: post-procedure vital signs reviewed and stable Respiratory status: spontaneous breathing Cardiovascular status: blood pressure returned to baseline Anesthetic complications: no     Last Vitals:  Vitals:   05/08/18 1100 05/08/18 1134  BP:  109/65  Pulse: 97 83  Resp: (!) 24   Temp: 36.9 C 36.9 C  SpO2:  98%    Last Pain:  Vitals:   05/08/18 1134  TempSrc: Oral  PainSc:                  Blasa Raisch

## 2018-05-08 NOTE — Procedures (Signed)
ECT SERVICES Physician's Interval Evaluation & Treatment Note  Patient Identification: Melinda [name] MRN:  [id number] Date of Evaluation:  05/08/2018 TX #: 301  MADRS:   MMSE:   P.E. Findings:  No change to physical exam.  Vitals normal  Psychiatric Interval Note:  Patient is more sad and down today self reflective not actively suicidal not psychotic  Subjective:  Patient is a 47 y.o. female seen for evaluation for Electroconvulsive Therapy. More down and a little sad today  Treatment Summary:   []   Right Unilateral             [x]  Bilateral   % Energy : 1.0 ms 35%   Impedance: 1150 ohms  Seizure Energy Index: 8054 V squared  Postictal Suppression Index: 91%  Seizure Concordance Index: 97%  Medications  Pre Shock: Robinul 0.4 mg Toradol 30 mg labetalol 20 mg esmolol 10 mg Brevital 70 mg succinylcholine 100 mg  Post Shock:    Seizure Duration: 47 seconds by EMG 122 seconds by EEG   Comments: Current schedule seems to be adequate follow-up 2 weeks.  Lungs:  [x]   Clear to auscultation               []  Other:   Heart:    [x]   Regular rhythm             []  irregular rhythm    [x]   Previous H&P reviewed, patient examined and there are NO CHANGES                 []   Previous H&P reviewed, patient examined and there are changes noted.   [name], MD 6/28/201910:10 AM

## 2018-05-18 ENCOUNTER — Other Ambulatory Visit: Payer: Self-pay | Admitting: Psychiatry

## 2018-05-20 ENCOUNTER — Telehealth: Payer: Self-pay

## 2018-05-21 ENCOUNTER — Other Ambulatory Visit: Payer: Self-pay | Admitting: Psychiatry

## 2018-05-22 ENCOUNTER — Encounter: Payer: Self-pay | Admitting: Registered Nurse

## 2018-05-22 ENCOUNTER — Encounter
Admission: RE | Admit: 2018-05-22 | Discharge: 2018-05-22 | Disposition: A | Discharge status: Home / Self Care | Payer: Medicare PPO | Source: Ambulatory Visit | Attending: Psychiatry | Admitting: Psychiatry

## 2018-05-22 DIAGNOSIS — F329 Major depressive disorder, single episode, unspecified: Secondary | ICD-10-CM | POA: Diagnosis not present

## 2018-05-22 DIAGNOSIS — E1142 Type 2 diabetes mellitus with diabetic polyneuropathy: Secondary | ICD-10-CM | POA: Insufficient documentation

## 2018-05-22 DIAGNOSIS — Z9889 Other specified postprocedural states: Secondary | ICD-10-CM | POA: Diagnosis not present

## 2018-05-22 DIAGNOSIS — Z8249 Family history of ischemic heart disease and other diseases of the circulatory system: Secondary | ICD-10-CM | POA: Diagnosis not present

## 2018-05-22 DIAGNOSIS — F515 Nightmare disorder: Secondary | ICD-10-CM | POA: Diagnosis not present

## 2018-05-22 DIAGNOSIS — G4730 Sleep apnea, unspecified: Secondary | ICD-10-CM | POA: Diagnosis not present

## 2018-05-22 DIAGNOSIS — Z888 Allergy status to other drugs, medicaments and biological substances status: Secondary | ICD-10-CM | POA: Insufficient documentation

## 2018-05-22 DIAGNOSIS — F332 Major depressive disorder, recurrent severe without psychotic features: Secondary | ICD-10-CM | POA: Diagnosis not present

## 2018-05-22 DIAGNOSIS — E1140 Type 2 diabetes mellitus with diabetic neuropathy, unspecified: Secondary | ICD-10-CM | POA: Diagnosis not present

## 2018-05-22 DIAGNOSIS — Z833 Family history of diabetes mellitus: Secondary | ICD-10-CM | POA: Insufficient documentation

## 2018-05-22 DIAGNOSIS — E7800 Pure hypercholesterolemia, unspecified: Secondary | ICD-10-CM | POA: Diagnosis not present

## 2018-05-22 DIAGNOSIS — I10 Essential (primary) hypertension: Secondary | ICD-10-CM | POA: Insufficient documentation

## 2018-05-22 DIAGNOSIS — F609 Personality disorder, unspecified: Secondary | ICD-10-CM | POA: Diagnosis not present

## 2018-05-22 DIAGNOSIS — K219 Gastro-esophageal reflux disease without esophagitis: Secondary | ICD-10-CM | POA: Diagnosis not present

## 2018-05-22 MED ORDER — METHOHEXITAL SODIUM 100 MG/10ML IV SOSY
PREFILLED_SYRINGE | INTRAVENOUS | Status: DC | PRN
  Administered 2018-05-22: 70 mg via INTRAVENOUS

## 2018-05-22 MED ORDER — LABETALOL HCL 5 MG/ML IV SOLN
INTRAVENOUS | Status: DC | PRN
  Administered 2018-05-22: 20 mg via INTRAVENOUS

## 2018-05-22 MED ORDER — KETOROLAC TROMETHAMINE 30 MG/ML IJ SOLN
INTRAMUSCULAR | Status: AC
  Administered 2018-05-22: 30 mg via INTRAVENOUS
  Filled 2018-05-22: qty 1

## 2018-05-22 MED ORDER — SODIUM CHLORIDE 0.9 % IV SOLN
500.0000 mL | Freq: Once | INTRAVENOUS | Status: AC
  Administered 2018-05-22: 500 mL via INTRAVENOUS

## 2018-05-22 MED ORDER — GLYCOPYRROLATE 0.2 MG/ML IJ SOLN
0.4000 mg | Freq: Once | INTRAMUSCULAR | Status: AC
  Administered 2018-05-22: 0.4 mg via INTRAVENOUS

## 2018-05-22 MED ORDER — GLYCOPYRROLATE 0.2 MG/ML IJ SOLN
INTRAMUSCULAR | Status: AC
  Administered 2018-05-22: 0.4 mg via INTRAVENOUS
  Filled 2018-05-22: qty 2

## 2018-05-22 MED ORDER — KETOROLAC TROMETHAMINE 30 MG/ML IJ SOLN
30.0000 mg | Freq: Once | INTRAMUSCULAR | Status: AC
  Administered 2018-05-22: 30 mg via INTRAVENOUS

## 2018-05-22 MED ORDER — ESMOLOL HCL 100 MG/10ML IV SOLN
INTRAVENOUS | Status: AC
  Filled 2018-05-22: qty 10

## 2018-05-22 MED ORDER — SUCCINYLCHOLINE CHLORIDE 20 MG/ML IJ SOLN
INTRAMUSCULAR | Status: AC
  Filled 2018-05-22: qty 2

## 2018-05-22 MED ORDER — LABETALOL HCL 5 MG/ML IV SOLN
INTRAVENOUS | Status: AC
  Filled 2018-05-22: qty 4

## 2018-05-22 MED ORDER — ESMOLOL HCL 100 MG/10ML IV SOLN
INTRAVENOUS | Status: DC | PRN
  Administered 2018-05-22: 20 mg via INTRAVENOUS

## 2018-05-22 MED ORDER — SODIUM CHLORIDE 0.9 % IV SOLN
INTRAVENOUS | Status: DC | PRN
  Administered 2018-05-22: 09:00:00 via INTRAVENOUS

## 2018-05-22 MED ORDER — SUCCINYLCHOLINE CHLORIDE 20 MG/ML IJ SOLN
INTRAMUSCULAR | Status: DC | PRN
  Administered 2018-05-22: 100 mg via INTRAVENOUS

## 2018-05-22 MED ORDER — SUCCINYLCHOLINE CHLORIDE 20 MG/ML IJ SOLN
INTRAMUSCULAR | Status: AC
  Filled 2018-05-22: qty 1

## 2018-05-22 MED ORDER — METHOHEXITAL SODIUM 0.5 G IJ SOLR
INTRAMUSCULAR | Status: AC
  Filled 2018-05-22: qty 500

## 2018-05-22 NOTE — Transfer of Care (Signed)
Immediate Anesthesia Transfer of Care Note  Patient: [name]  Procedure(s) Performed: ECT TX  Patient Location: PACU  Anesthesia Type:General  Level of Consciousness: sedated  Airway & Oxygen Therapy: Patient Spontanous Breathing and Patient connected to face mask oxygen  Post-op Assessment: Report given to RN and Post -op Vital signs reviewed and stable  Post vital signs: Reviewed and stable  Last Vitals:  Vitals Value Taken Time  BP    Temp    Pulse    Resp    SpO2      Last Pain:  Vitals:   05/22/18 0738  TempSrc: Oral         Complications: No apparent anesthesia complications

## 2018-05-22 NOTE — Anesthesia Post-op Follow-up Note (Signed)
Anesthesia QCDR form completed.        

## 2018-05-22 NOTE — Anesthesia Preprocedure Evaluation (Signed)
Anesthesia Evaluation  Patient identified by MRN, date of birth, ID band Patient awake    Reviewed: Allergy & Precautions, H&P , NPO status , reviewed documented beta blocker date and time   Airway Mallampati: II  TM Distance: >3 FB Neck ROM: full    Dental  (+) Chipped, Missing   Pulmonary sleep apnea ,    Pulmonary exam normal        Cardiovascular hypertension, Normal cardiovascular exam     Neuro/Psych PSYCHIATRIC DISORDERS Depression Bipolar Disorder  Neuromuscular disease    GI/Hepatic GERD  Controlled,  Endo/Other  diabetes  Renal/GU      Musculoskeletal   Abdominal   Peds  Hematology   Anesthesia Other Findings Past Medical History: No date: Depression No date: Diabetes mellitus without complication ([place]) 8/93/81: Diabetic peripheral neuropathy ([place]) 03/07/15: Diabetic peripheral neuropathy ([place]) 03/07/15: Diabetic peripheral neuropathy ([facility]) No date: GERD (gastroesophageal reflux disease) 03/07/15: Hypercholesterolemia No date: Hypertension 03/07/15: Obesity 03/07/15: Personality disorder ([place]) 03/07/15: Sinus tachycardia     Comment:  history of 03/07/15: Suicidal thoughts  Past Surgical History: 03/07/15: electroconvulsion therapy  BMI    Body Mass Index:  48.36 kg/m      Reproductive/Obstetrics                             Anesthesia Physical  Anesthesia Plan  ASA: III  Anesthesia Plan: General   Post-op Pain Management:    Induction:   PONV Risk Score and Plan: 3 and Treatment may vary due to age or medical condition and TIVA  Airway Management Planned:   Additional Equipment:   Intra-op Plan:   Post-operative Plan:   Informed Consent: I have reviewed the patients History and Physical, chart, labs and discussed the procedure including the risks, benefits and alternatives for the proposed anesthesia with the patient or authorized representative who has  indicated his/her understanding and acceptance.   Dental Advisory Given  Plan Discussed with: CRNA  Anesthesia Plan Comments:         Anesthesia Quick Evaluation

## 2018-05-22 NOTE — Anesthesia Postprocedure Evaluation (Signed)
Anesthesia Post Note  Patient: Melinda [name]  Procedure(s) Performed: ECT TX  Patient location during evaluation: PACU Anesthesia Type: General Level of consciousness: awake and alert and oriented Pain management: pain level controlled Vital Signs Assessment: post-procedure vital signs reviewed and stable Respiratory status: spontaneous breathing Cardiovascular status: blood pressure returned to baseline Anesthetic complications: no     Last Vitals:  Vitals:   05/22/18 0928 05/22/18 1000  BP:  126/68  Pulse: 97 90  Resp:  16  Temp:  36.4 C  SpO2:      Last Pain:  Vitals:   05/22/18 1000  TempSrc: Oral  PainSc: 0-No pain                 [name]

## 2018-05-22 NOTE — Procedures (Signed)
ECT SERVICES Physician's Interval Evaluation & Treatment Note  Patient Identification: Melinda [name] MRN:  [id number] Date of Evaluation:  05/22/2018 TX #: 302  MADRS:   MMSE:   P.E. Findings:  No change to physical exam  Psychiatric Interval Note:  Chronic dysthymia low mood.  Subjective:  Patient is a 47 y.o. female seen for evaluation for Electroconvulsive Therapy. Has been having some nightmares recently which we discussed  Treatment Summary:   []   Right Unilateral             [x]  Bilateral   % Energy : 1.0 ms 35%   Impedance: [place] and 50 ohms  Seizure Energy Index: 2863 V squared  Postictal Suppression Index: 88%  Seizure Concordance Index: 96%  Medications  Pre Shock: Robinul 0.4 mg Toradol 30 mg labetalol 20 mg esmolol 10 mg Brevital 70 mg succinylcholine 100 mg  Post Shock:    Seizure Duration: 38 seconds by EMG 76 seconds by EEG   Comments: Doing well.  Tried to spend some time once again today doing a little bit of counseling and encouragement follow-up 2 weeks  Lungs:  [x]   Clear to auscultation               []  Other:   Heart:    [x]   Regular rhythm             []  irregular rhythm    [x]   Previous H&P reviewed, patient examined and there are NO CHANGES                 []   Previous H&P reviewed, patient examined and there are changes noted.   [name], MD 7/12/20198:45 AM

## 2018-05-22 NOTE — Anesthesia Procedure Notes (Signed)
Performed by: Mionna Advincula, CRNA Pre-anesthesia Checklist: Patient identified, Emergency Drugs available, Suction available and Patient being monitored Patient Re-evaluated:Patient Re-evaluated prior to induction Oxygen Delivery Method: Circle system utilized Preoxygenation: Pre-oxygenation with 100% oxygen Induction Type: IV induction Ventilation: Mask ventilation without difficulty and Mask ventilation throughout procedure Airway Equipment and Method: Bite block Placement Confirmation: positive ETCO2 Dental Injury: Teeth and Oropharynx as per pre-operative assessment        

## 2018-05-22 NOTE — H&P (Signed)
Melinda [name] is an 47 y.o. female.   Chief Complaint: Patient with chronic depression.  Having nightmares recently.  Chronic low mood. HPI: Chronic depression not actively suicidal.  No new physical complaint.  Past Medical History:  Diagnosis Date  . Depression   . Diabetes mellitus without complication ([place])   . Diabetic peripheral neuropathy ([place]) 03/07/15  . Diabetic peripheral neuropathy ([place]) 03/07/15  . Diabetic peripheral neuropathy ([place]) 03/07/15  . GERD (gastroesophageal reflux disease)   . Hypercholesterolemia 03/07/15  . Hypertension   . Obesity 03/07/15  . Personality disorder ([place]) 03/07/15  . Sinus tachycardia 03/07/15   history of  . Suicidal thoughts 03/07/15    Past Surgical History:  Procedure Laterality Date  . electroconvulsion therapy  03/07/15    Family History  Problem Relation Age of Onset  . Hypertension Father   . Diabetes Mother    Social History:  reports that she has never smoked. She has never used smokeless tobacco. She reports that she does not drink alcohol or use drugs.  Allergies:  Allergies  Allergen Reactions  . Prednisone     Increases blood sugar     (Not in a hospital admission)  No results found for this or any previous visit (from the past 48 hour(s)). No results found.  Review of Systems  Constitutional: Negative.   HENT: Negative.   Eyes: Negative.   Respiratory: Negative.   Cardiovascular: Negative.   Gastrointestinal: Negative.   Musculoskeletal: Negative.   Skin: Negative.   Neurological: Negative.   Psychiatric/Behavioral: Negative.     Blood pressure (!) 135/59, pulse 82, temperature 98.5 F (36.9 C), temperature source Oral, resp. rate 16, height 5\' 4"  (1.626 m), weight 124.3 kg (274 lb), SpO2 98 %. Physical Exam  Nursing note and vitals reviewed. Constitutional: She appears well-developed and well-nourished.  HENT:  Head: Normocephalic and atraumatic.  Eyes: Pupils are equal, round, and reactive to light.  Conjunctivae are normal.  Neck: Normal range of motion.  Cardiovascular: Regular rhythm and normal heart sounds.  Respiratory: Effort normal. No respiratory distress.  GI: Soft.  Musculoskeletal: Normal range of motion.  Neurological: She is alert.  Skin: Skin is warm and dry.  Psychiatric: Judgment normal. Her affect is blunt. Her speech is delayed. She is slowed. Cognition and memory are normal. She expresses no suicidal ideation.     Assessment/Plan ECT on a 2-week schedule continuing into the future.  Supportive counseling and therapy done as well.  [name], MD 05/22/2018, 8:43 AM

## 2018-05-22 NOTE — Discharge Instructions (Signed)
1)  The drugs that you have been given will stay in your system until tomorrow so for the       next 24 hours you should not:  A. Drive an automobile  B. Make any legal decisions  C. Drink any alcoholic beverages  2)  You may resume your regular meals upon return home.  3)  A responsible adult must take you home.  Someone should stay with you for a few          hours, then be available by phone for the remainder of the treatment day.  4)  You May experience any of the following symptoms:  Headache, Nausea and a dry mouth (due to the medications you were given),  temporary memory loss and some confusion, or sore muscles (a warm bath  should help this).  If you you experience any of these symptoms let us know on                your return visit.  5)  Report any of the following: any acute discomfort, severe headache, or temperature        greater than 100.5 F.   Also report any unusual redness, swelling, drainage, or pain         at your IV site.    You may report Symptoms to:  [place] [facility]          Phone: [phone], ECT Department           or Dr. Prescott Gum office [phone]  6)  Your next ECT Treatment is Friday July 27   We will call 2 days prior to your scheduled appointment for arrival times.  7)  Nothing to eat or drink after midnight the night before your procedure.  8)  Take    With a sip of water the morning of your procedure.  9)  Other Instructions: Call [phone] to cancel the morning of your procedure due         to illness or emergency.  10) We will call within 72 hours to assess how you are feeling.

## 2018-05-27 DIAGNOSIS — E119 Type 2 diabetes mellitus without complications: Secondary | ICD-10-CM | POA: Diagnosis not present

## 2018-06-03 ENCOUNTER — Telehealth: Payer: Self-pay

## 2018-06-04 ENCOUNTER — Other Ambulatory Visit: Payer: Self-pay | Admitting: Psychiatry

## 2018-06-05 ENCOUNTER — Encounter (HOSPITAL_BASED_OUTPATIENT_CLINIC_OR_DEPARTMENT_OTHER)
Admission: RE | Admit: 2018-06-05 | Discharge: 2018-06-05 | Disposition: A | Discharge status: Home / Self Care | Payer: Medicare PPO | Source: Ambulatory Visit | Attending: Psychiatry | Admitting: Psychiatry

## 2018-06-05 ENCOUNTER — Encounter: Payer: Self-pay | Admitting: Anesthesiology

## 2018-06-05 DIAGNOSIS — Z9889 Other specified postprocedural states: Secondary | ICD-10-CM | POA: Diagnosis not present

## 2018-06-05 DIAGNOSIS — E7800 Pure hypercholesterolemia, unspecified: Secondary | ICD-10-CM | POA: Diagnosis not present

## 2018-06-05 DIAGNOSIS — I10 Essential (primary) hypertension: Secondary | ICD-10-CM | POA: Diagnosis not present

## 2018-06-05 DIAGNOSIS — Z833 Family history of diabetes mellitus: Secondary | ICD-10-CM | POA: Diagnosis not present

## 2018-06-05 DIAGNOSIS — F515 Nightmare disorder: Secondary | ICD-10-CM | POA: Diagnosis not present

## 2018-06-05 DIAGNOSIS — E1142 Type 2 diabetes mellitus with diabetic polyneuropathy: Secondary | ICD-10-CM | POA: Diagnosis not present

## 2018-06-05 DIAGNOSIS — F332 Major depressive disorder, recurrent severe without psychotic features: Secondary | ICD-10-CM | POA: Diagnosis not present

## 2018-06-05 DIAGNOSIS — Z888 Allergy status to other drugs, medicaments and biological substances status: Secondary | ICD-10-CM | POA: Diagnosis not present

## 2018-06-05 DIAGNOSIS — F329 Major depressive disorder, single episode, unspecified: Secondary | ICD-10-CM | POA: Diagnosis not present

## 2018-06-05 DIAGNOSIS — Z8249 Family history of ischemic heart disease and other diseases of the circulatory system: Secondary | ICD-10-CM | POA: Diagnosis not present

## 2018-06-05 DIAGNOSIS — E1140 Type 2 diabetes mellitus with diabetic neuropathy, unspecified: Secondary | ICD-10-CM | POA: Diagnosis not present

## 2018-06-05 DIAGNOSIS — F609 Personality disorder, unspecified: Secondary | ICD-10-CM | POA: Diagnosis not present

## 2018-06-05 DIAGNOSIS — G4730 Sleep apnea, unspecified: Secondary | ICD-10-CM | POA: Diagnosis not present

## 2018-06-05 LAB — POCT PREGNANCY, URINE: Preg Test, Ur: NEGATIVE

## 2018-06-05 LAB — GLUCOSE, CAPILLARY
Glucose-Capillary: 126 mg/dL — ABNORMAL HIGH (ref 70–99)
Glucose-Capillary: 159 mg/dL — ABNORMAL HIGH (ref 70–99)

## 2018-06-05 MED ORDER — LABETALOL HCL 5 MG/ML IV SOLN
INTRAVENOUS | Status: AC
  Filled 2018-06-05: qty 4

## 2018-06-05 MED ORDER — GLYCOPYRROLATE 0.2 MG/ML IJ SOLN
INTRAMUSCULAR | Status: AC
  Administered 2018-06-05: 0.4 mg via INTRAVENOUS
  Filled 2018-06-05: qty 2

## 2018-06-05 MED ORDER — ESMOLOL HCL 100 MG/10ML IV SOLN
INTRAVENOUS | Status: AC
  Filled 2018-06-05: qty 10

## 2018-06-05 MED ORDER — ESMOLOL HCL 100 MG/10ML IV SOLN
INTRAVENOUS | Status: DC | PRN
  Administered 2018-06-05: 20 mg via INTRAVENOUS

## 2018-06-05 MED ORDER — LABETALOL HCL 5 MG/ML IV SOLN
INTRAVENOUS | Status: DC | PRN
  Administered 2018-06-05: 20 mg via INTRAVENOUS

## 2018-06-05 MED ORDER — METHOHEXITAL SODIUM 100 MG/10ML IV SOSY
PREFILLED_SYRINGE | INTRAVENOUS | Status: DC | PRN
  Administered 2018-06-05: 70 mg via INTRAVENOUS

## 2018-06-05 MED ORDER — SODIUM CHLORIDE 0.9 % IV SOLN
500.0000 mL | Freq: Once | INTRAVENOUS | Status: AC
  Administered 2018-06-05: 500 mL via INTRAVENOUS

## 2018-06-05 MED ORDER — KETOROLAC TROMETHAMINE 30 MG/ML IJ SOLN
30.0000 mg | Freq: Once | INTRAMUSCULAR | Status: AC
  Administered 2018-06-05: 30 mg via INTRAVENOUS

## 2018-06-05 MED ORDER — SODIUM CHLORIDE 0.9 % IV SOLN
INTRAVENOUS | Status: DC | PRN
  Administered 2018-06-05: 11:00:00 via INTRAVENOUS

## 2018-06-05 MED ORDER — SUCCINYLCHOLINE CHLORIDE 20 MG/ML IJ SOLN
INTRAMUSCULAR | Status: DC | PRN
  Administered 2018-06-05: 100 mg via INTRAVENOUS

## 2018-06-05 MED ORDER — GLYCOPYRROLATE 0.2 MG/ML IJ SOLN
0.4000 mg | Freq: Once | INTRAMUSCULAR | Status: AC
  Administered 2018-06-05: 0.4 mg via INTRAVENOUS

## 2018-06-05 MED ORDER — KETOROLAC TROMETHAMINE 30 MG/ML IJ SOLN
INTRAMUSCULAR | Status: AC
  Administered 2018-06-05: 30 mg via INTRAVENOUS
  Filled 2018-06-05: qty 1

## 2018-06-05 NOTE — Transfer of Care (Signed)
Immediate Anesthesia Transfer of Care Note  Patient: Melinda [name]  Procedure(s) Performed: ECT TX  Patient Location: PACU  Anesthesia Type:General  Level of Consciousness: awake  Airway & Oxygen Therapy: Patient Spontanous Breathing and Patient connected to face mask oxygen  Post-op Assessment: Report given to RN and Post -op Vital signs reviewed and stable  Post vital signs: Reviewed  Last Vitals:  Vitals Value Taken Time  BP 139/104 06/05/2018 10:49 AM  Temp 36.8 C 06/05/2018 10:50 AM  Pulse 95 06/05/2018 10:51 AM  Resp 15 06/05/2018 10:51 AM  SpO2 100 % 06/05/2018 10:51 AM  Vitals shown include unvalidated device data.  Last Pain:  Vitals:   06/05/18 1050  TempSrc:   PainSc: Asleep         Complications: No apparent anesthesia complications

## 2018-06-05 NOTE — Anesthesia Post-op Follow-up Note (Signed)
Anesthesia QCDR form completed.        

## 2018-06-05 NOTE — Discharge Instructions (Signed)
1)  The drugs that you have been given will stay in your system until tomorrow so for the       next 24 hours you should not:  A. Drive an automobile  B. Make any legal decisions  C. Drink any alcoholic beverages  2)  You may resume your regular meals upon return home.  3)  A responsible adult must take you home.  Someone should stay with you for a few          hours, then be available by phone for the remainder of the treatment day.  4)  You May experience any of the following symptoms:  Headache, Nausea and a dry mouth (due to the medications you were given),  temporary memory loss and some confusion, or sore muscles (a warm bath  should help this).  If you you experience any of these symptoms let us know on                your return visit.  5)  Report any of the following: any acute discomfort, severe headache, or temperature        greater than 100.5 F.   Also report any unusual redness, swelling, drainage, or pain         at your IV site.    You may report Symptoms to:  [place] at [facility]          Phone: [phone], ECT Department           or Dr. Prescott Gum office [phone]  6)  Your next ECT Treatment is Day  Friday   Date  June 26, 2018  at 8 AM  We will call 2 days prior to your scheduled appointment for arrival times.  7)  Nothing to eat or drink after midnight the night before your procedure.  8)  Take .     With a sip of water the morning of your procedure.  9)  Other Instructions: Call [phone] to cancel the morning of your procedure due         to illness or emergency.  10) We will call within 72 hours to assess how you are feeling.

## 2018-06-05 NOTE — Procedures (Signed)
ECT SERVICES Physician's Interval Evaluation & Treatment Note  Patient Identification: MELBA ARAKI MRN:  [id number] Date of Evaluation:  06/05/2018 TX #: 303  MADRS: 22  MMSE: 29  P.E. Findings:  No change to physical exam  Psychiatric Interval Note:  Continues to be chronically depressed and anxious without psychosis or suicidal thoughts.  Subjective:  Patient is a 47 y.o. female seen for evaluation for Electroconvulsive Therapy. "I need my ECT"  Treatment Summary:   []   Right Unilateral             [x]  Bilateral   % Energy : 1.0 ms 35%   Impedance: 900 ohms  Seizure Energy Index: 4685 V squared  Postictal Suppression Index: 91%  Seizure Concordance Index: 98%  Medications  Pre Shock: Robinul 0.4 mg Toradol 30 mg labetalol 20 mg esmolol 10 mg Brevital 70 mg succinylcholine 100 mg  Post Shock:    Seizure Duration: 50 seconds by EMG 86 seconds by EEG   Comments: Follow-up in 2 weeks  Lungs:  [x]   Clear to auscultation               []  Other:   Heart:    [x]   Regular rhythm             []  irregular rhythm    [x]   Previous H&P reviewed, patient examined and there are NO CHANGES                 []   Previous H&P reviewed, patient examined and there are changes noted.   [name], MD 7/26/201910:34 AM

## 2018-06-05 NOTE — Anesthesia Procedure Notes (Signed)
Procedure Name: General with mask airway Performed by: Rolla Plate, CRNA Pre-anesthesia Checklist: Patient identified, Patient being monitored, Timeout performed, Emergency Drugs available and Suction available Patient Re-evaluated:Patient Re-evaluated prior to induction Oxygen Delivery Method: Circle system utilized Preoxygenation: Pre-oxygenation with 100% oxygen Induction Type: IV induction Ventilation: Mask ventilation without difficulty

## 2018-06-05 NOTE — Anesthesia Preprocedure Evaluation (Signed)
Anesthesia Evaluation  Patient identified by MRN, date of birth, ID band Patient awake    Reviewed: Allergy & Precautions, H&P , NPO status , Patient's Chart, lab work & pertinent test results  History of Anesthesia Complications Negative for: history of anesthetic complications  Airway Mallampati: II  TM Distance: >3 FB Neck ROM: full    Dental  (+) Poor Dentition, Chipped   Pulmonary sleep apnea , neg COPD,    Pulmonary exam normal breath sounds clear to auscultation       Cardiovascular hypertension, Pt. on medications (-) CAD and (-) Past MI negative cardio ROS Normal cardiovascular exam Rhythm:regular Rate:Normal     Neuro/Psych PSYCHIATRIC DISORDERS Depression Bipolar Disorder  Neuromuscular disease negative neurological ROS     GI/Hepatic negative GI ROS, Neg liver ROS, GERD  Medicated,  Endo/Other  diabetes, Type 2, Oral Hypoglycemic Agents  Renal/GU negative Renal ROS  negative genitourinary   Musculoskeletal   Abdominal (+) + obese,   Peds  Hematology negative hematology ROS (+)   Anesthesia Other Findings Past Medical History: No date: Depression No date: Diabetes mellitus without complication ([facility]) 03/07/15: Diabetic peripheral neuropathy ([facility]) 03/07/15: Diabetic peripheral neuropathy ([facility]) 03/07/15: Diabetic peripheral neuropathy ([facility]) No date: GERD (gastroesophageal reflux disease) 03/07/15: Hypercholesterolemia No date: Hypertension 03/07/15: Obesity 03/07/15: Personality disorder 03/07/15: Sinus tachycardia ([facility])     Comment: history of 03/07/15: Suicidal thoughts Past Surgical History: 03/07/15: electroconvulsion therapy BMI    Body Mass Index:  36.44 kg/m     Reproductive/Obstetrics                             Anesthesia Physical  Anesthesia Plan  ASA: III  Anesthesia Plan: General   Post-op Pain Management:    Induction: Intravenous  PONV Risk Score and  Plan: 2 and Ondansetron  Airway Management Planned: Mask  Additional Equipment:   Intra-op Plan:   Post-operative Plan:   Informed Consent: I have reviewed the patients History and Physical, chart, labs and discussed the procedure including the risks, benefits and alternatives for the proposed anesthesia with the patient or authorized representative who has indicated his/her understanding and acceptance.     Dental Advisory Given  Plan Discussed with: CRNA and Anesthesiologist  Anesthesia Plan Comments:         Anesthesia Quick Evaluation  

## 2018-06-05 NOTE — Anesthesia Postprocedure Evaluation (Signed)
Anesthesia Post Note  Patient: Melinda [name]  Procedure(s) Performed: ECT TX  Patient location during evaluation: PACU Anesthesia Type: General Level of consciousness: awake and alert Pain management: pain level controlled Vital Signs Assessment: post-procedure vital signs reviewed and stable Respiratory status: spontaneous breathing, nonlabored ventilation and respiratory function stable Cardiovascular status: blood pressure returned to baseline and stable Postop Assessment: no signs of nausea or vomiting Anesthetic complications: no     Last Vitals:  Vitals:   06/05/18 1110 06/05/18 1127  BP: 130/76 126/88  Pulse: 86 70  Resp: 19 16  Temp:    SpO2: 99%     Last Pain:  Vitals:   06/05/18 1127  TempSrc:   PainSc: 3                  Demetrion Wesby

## 2018-06-05 NOTE — H&P (Signed)
Melinda [name] is an 47 y.o. female.   Chief Complaint: She with no new complaint.  Chronic depression.  Some minor self injury but no actual wish to die.  Chronic anxiety.  No new physical complaint. HPI: Patient with chronic depression and self injury low mood.  Patient was very clear today and stating that ECT is crucial for her and keeping her mood better.  Definitely feels better after each treatment.  Past Medical History:  Diagnosis Date  . Depression   . Diabetes mellitus without complication ([place])   . Diabetic peripheral neuropathy ([place]) 03/07/15  . Diabetic peripheral neuropathy ([place]) 03/07/15  . Diabetic peripheral neuropathy ([place]) 03/07/15  . GERD (gastroesophageal reflux disease)   . Hypercholesterolemia 03/07/15  . Hypertension   . Obesity 03/07/15  . Personality disorder ([place]) 03/07/15  . Sinus tachycardia 03/07/15   history of  . Suicidal thoughts 03/07/15    Past Surgical History:  Procedure Laterality Date  . electroconvulsion therapy  03/07/15    Family History  Problem Relation Age of Onset  . Hypertension Father   . Diabetes Mother    Social History:  reports that she has never smoked. She has never used smokeless tobacco. She reports that she does not drink alcohol or use drugs.  Allergies:  Allergies  Allergen Reactions  . Prednisone     Increases blood sugar     (Not in a hospital admission)  Results for orders placed or performed during the hospital encounter of 06/05/18 (from the past 48 hour(s))  Glucose, capillary     Status: Abnormal   Collection Time: 06/05/18  8:35 AM  Result Value Ref Range   Glucose-Capillary 126 (H) 70 - 99 mg/dL  Pregnancy, urine POC     Status: None   Collection Time: 06/05/18  8:38 AM  Result Value Ref Range   Preg Test, Ur NEGATIVE NEGATIVE    Comment:        THE SENSITIVITY OF THIS METHODOLOGY IS >24 mIU/mL    No results found.  Review of Systems  Constitutional: Negative.   HENT: Negative.   Eyes:  Negative.   Respiratory: Negative.   Cardiovascular: Negative.   Gastrointestinal: Negative.   Musculoskeletal: Negative.   Skin: Negative.   Neurological: Negative.   Psychiatric/Behavioral: Positive for depression. Negative for hallucinations, substance abuse and suicidal ideas. The patient is not nervous/anxious and does not have insomnia.     Blood pressure (!) 145/79, pulse 77, temperature (!) 97.3 F (36.3 C), temperature source Oral, resp. rate 16, height 5\' 4"  (1.626 m), weight 123.4 kg (272 lb), last menstrual period 05/21/2018, SpO2 100 %. Physical Exam  Nursing note and vitals reviewed. Constitutional: She appears well-developed and well-nourished.  HENT:  Head: Normocephalic and atraumatic.  Eyes: Pupils are equal, round, and reactive to light. Conjunctivae are normal.  Neck: Normal range of motion.  Cardiovascular: Normal heart sounds.  Respiratory: Effort normal.  GI: Soft.  Musculoskeletal: Normal range of motion.  Neurological: She is alert.  Skin: Skin is warm and dry.  Psychiatric: Judgment normal. Her affect is blunt. Her speech is delayed. She is slowed. Thought content is not paranoid. She expresses no homicidal and no suicidal ideation. She exhibits abnormal recent memory.     Assessment/Plan Treatment today and we will continue on the 2-week schedule as best we can.  No change to protocol supportive therapy review of plan with patient.  [name], MD 06/05/2018, 9:39 AM

## 2018-06-08 DIAGNOSIS — E1169 Type 2 diabetes mellitus with other specified complication: Secondary | ICD-10-CM | POA: Insufficient documentation

## 2018-06-08 DIAGNOSIS — E669 Obesity, unspecified: Secondary | ICD-10-CM | POA: Insufficient documentation

## 2018-06-17 ENCOUNTER — Other Ambulatory Visit: Payer: Self-pay | Admitting: Psychiatry

## 2018-06-24 ENCOUNTER — Telehealth: Payer: Self-pay

## 2018-06-26 ENCOUNTER — Encounter: Payer: Self-pay | Admitting: Anesthesiology

## 2018-06-26 ENCOUNTER — Other Ambulatory Visit: Payer: Self-pay | Admitting: Psychiatry

## 2018-06-26 ENCOUNTER — Encounter
Admission: RE | Admit: 2018-06-26 | Discharge: 2018-06-26 | Disposition: A | Discharge status: Home / Self Care | Payer: Medicare PPO | Source: Ambulatory Visit | Attending: Psychiatry | Admitting: Psychiatry

## 2018-06-26 DIAGNOSIS — F332 Major depressive disorder, recurrent severe without psychotic features: Secondary | ICD-10-CM

## 2018-06-26 DIAGNOSIS — F339 Major depressive disorder, recurrent, unspecified: Secondary | ICD-10-CM | POA: Insufficient documentation

## 2018-06-26 DIAGNOSIS — Z8249 Family history of ischemic heart disease and other diseases of the circulatory system: Secondary | ICD-10-CM | POA: Diagnosis not present

## 2018-06-26 DIAGNOSIS — Z888 Allergy status to other drugs, medicaments and biological substances status: Secondary | ICD-10-CM | POA: Diagnosis not present

## 2018-06-26 DIAGNOSIS — I10 Essential (primary) hypertension: Secondary | ICD-10-CM | POA: Insufficient documentation

## 2018-06-26 DIAGNOSIS — Z833 Family history of diabetes mellitus: Secondary | ICD-10-CM | POA: Diagnosis not present

## 2018-06-26 DIAGNOSIS — Z9889 Other specified postprocedural states: Secondary | ICD-10-CM | POA: Insufficient documentation

## 2018-06-26 DIAGNOSIS — F319 Bipolar disorder, unspecified: Secondary | ICD-10-CM | POA: Diagnosis not present

## 2018-06-26 DIAGNOSIS — E119 Type 2 diabetes mellitus without complications: Secondary | ICD-10-CM | POA: Diagnosis not present

## 2018-06-26 DIAGNOSIS — E1142 Type 2 diabetes mellitus with diabetic polyneuropathy: Secondary | ICD-10-CM | POA: Diagnosis not present

## 2018-06-26 DIAGNOSIS — K219 Gastro-esophageal reflux disease without esophagitis: Secondary | ICD-10-CM | POA: Diagnosis not present

## 2018-06-26 LAB — GLUCOSE, CAPILLARY
Glucose-Capillary: 109 mg/dL — ABNORMAL HIGH (ref 70–99)
Glucose-Capillary: 137 mg/dL — ABNORMAL HIGH (ref 70–99)

## 2018-06-26 LAB — POCT PREGNANCY, URINE: Preg Test, Ur: NEGATIVE

## 2018-06-26 MED ORDER — SODIUM CHLORIDE 0.9 % IV SOLN
500.0000 mL | Freq: Once | INTRAVENOUS | Status: AC
  Administered 2018-06-26: 500 mL via INTRAVENOUS

## 2018-06-26 MED ORDER — MIDAZOLAM HCL 2 MG/2ML IJ SOLN
INTRAMUSCULAR | Status: AC
  Filled 2018-06-26: qty 2

## 2018-06-26 MED ORDER — KETOROLAC TROMETHAMINE 30 MG/ML IJ SOLN
30.0000 mg | Freq: Once | INTRAMUSCULAR | Status: AC
  Administered 2018-06-26: 30 mg via INTRAVENOUS

## 2018-06-26 MED ORDER — METHOHEXITAL SODIUM 100 MG/10ML IV SOSY
PREFILLED_SYRINGE | INTRAVENOUS | Status: DC | PRN
  Administered 2018-06-26: 70 mg via INTRAVENOUS

## 2018-06-26 MED ORDER — LABETALOL HCL 5 MG/ML IV SOLN
INTRAVENOUS | Status: DC | PRN
  Administered 2018-06-26: 20 mg via INTRAVENOUS

## 2018-06-26 MED ORDER — SUCCINYLCHOLINE CHLORIDE 20 MG/ML IJ SOLN
INTRAMUSCULAR | Status: DC | PRN
  Administered 2018-06-26: 100 mg via INTRAVENOUS

## 2018-06-26 MED ORDER — GLYCOPYRROLATE 0.2 MG/ML IJ SOLN
INTRAMUSCULAR | Status: AC
  Administered 2018-06-26: 0.4 mg via INTRAVENOUS
  Filled 2018-06-26: qty 2

## 2018-06-26 MED ORDER — ATROPINE SULFATE 1 MG/10ML IJ SOSY
PREFILLED_SYRINGE | INTRAMUSCULAR | Status: AC
  Filled 2018-06-26: qty 10

## 2018-06-26 MED ORDER — GLYCOPYRROLATE 0.2 MG/ML IJ SOLN
0.4000 mg | Freq: Once | INTRAMUSCULAR | Status: AC
  Administered 2018-06-26: 0.4 mg via INTRAVENOUS

## 2018-06-26 MED ORDER — ESMOLOL HCL 100 MG/10ML IV SOLN
INTRAVENOUS | Status: DC | PRN
  Administered 2018-06-26: 20 mg via INTRAVENOUS

## 2018-06-26 MED ORDER — KETOROLAC TROMETHAMINE 30 MG/ML IJ SOLN
INTRAMUSCULAR | Status: AC
  Administered 2018-06-26: 30 mg via INTRAVENOUS
  Filled 2018-06-26: qty 1

## 2018-06-26 NOTE — Transfer of Care (Signed)
Immediate Anesthesia Transfer of Care Note  Patient: Melinda [name]  Procedure(s) Performed: ECT TX  Patient Location: PACU  Anesthesia Type:General  Level of Consciousness: awake  Airway & Oxygen Therapy: Patient Spontanous Breathing  Post-op Assessment: Report given to RN  Post vital signs: stable  Last Vitals:  Vitals Value Taken Time  BP 145/120 06/26/2018 10:32 AM  Temp    Pulse 102 06/26/2018 10:34 AM  Resp 27 06/26/2018 10:34 AM  SpO2 99 % 06/26/2018 10:34 AM  Vitals shown include unvalidated device data.  Last Pain:  Vitals:   06/26/18 0831  PainSc: 0-No pain         Complications: No apparent anesthesia complications

## 2018-06-26 NOTE — Anesthesia Preprocedure Evaluation (Signed)
Anesthesia Evaluation  Patient identified by MRN, date of birth, ID band Patient awake    Reviewed: Allergy & Precautions, H&P , NPO status , Patient's Chart, lab work & pertinent test results  History of Anesthesia Complications Negative for: history of anesthetic complications  Airway Mallampati: II  TM Distance: >3 FB Neck ROM: full    Dental  (+) Poor Dentition, Chipped   Pulmonary sleep apnea , neg COPD,    Pulmonary exam normal breath sounds clear to auscultation       Cardiovascular hypertension, Pt. on medications (-) CAD and (-) Past MI negative cardio ROS Normal cardiovascular exam Rhythm:regular Rate:Normal     Neuro/Psych PSYCHIATRIC DISORDERS Depression Bipolar Disorder  Neuromuscular disease negative neurological ROS     GI/Hepatic negative GI ROS, Neg liver ROS, GERD  Medicated,  Endo/Other  diabetes, Type 2, Oral Hypoglycemic Agents  Renal/GU negative Renal ROS  negative genitourinary   Musculoskeletal   Abdominal (+) + obese,   Peds  Hematology negative hematology ROS (+)   Anesthesia Other Findings Past Medical History: No date: Depression No date: Diabetes mellitus without complication ([facility]) 03/07/15: Diabetic peripheral neuropathy ([facility]) 03/07/15: Diabetic peripheral neuropathy ([facility]) 03/07/15: Diabetic peripheral neuropathy ([facility]) No date: GERD (gastroesophageal reflux disease) 03/07/15: Hypercholesterolemia No date: Hypertension 03/07/15: Obesity 03/07/15: Personality disorder 03/07/15: Sinus tachycardia ([facility])     Comment: history of 03/07/15: Suicidal thoughts Past Surgical History: 03/07/15: electroconvulsion therapy BMI    Body Mass Index:  36.44 kg/m     Reproductive/Obstetrics                             Anesthesia Physical  Anesthesia Plan  ASA: III  Anesthesia Plan: General   Post-op Pain Management:    Induction: Intravenous  PONV Risk Score and  Plan: 2 and Ondansetron  Airway Management Planned: Mask  Additional Equipment:   Intra-op Plan:   Post-operative Plan:   Informed Consent: I have reviewed the patients History and Physical, chart, labs and discussed the procedure including the risks, benefits and alternatives for the proposed anesthesia with the patient or authorized representative who has indicated his/her understanding and acceptance.     Dental Advisory Given  Plan Discussed with: CRNA and Anesthesiologist  Anesthesia Plan Comments:         Anesthesia Quick Evaluation  

## 2018-06-26 NOTE — H&P (Signed)
Melinda [name] is an 47 y.o. female.   Chief Complaint: Patient has no physical complaint.  Her mood has been more down and depressed.  She is having frequent thoughts about cutting herself.  No psychotic symptoms.  More nightmares and sleep problems. HPI: History of recurrent severe depression with some stability with the use of ECT and medicine.  Past Medical History:  Diagnosis Date  . Depression   . Diabetes mellitus without complication ([place])   . Diabetic peripheral neuropathy ([place]) 03/07/15  . Diabetic peripheral neuropathy ([place]) 03/07/15  . Diabetic peripheral neuropathy ([place]) 03/07/15  . GERD (gastroesophageal reflux disease)   . Hypercholesterolemia 03/07/15  . Hypertension   . Obesity 03/07/15  . Personality disorder ([place]) 03/07/15  . Sinus tachycardia 03/07/15   history of  . Suicidal thoughts 03/07/15    Past Surgical History:  Procedure Laterality Date  . electroconvulsion therapy  03/07/15    Family History  Problem Relation Age of Onset  . Hypertension Father   . Diabetes Mother    Social History:  reports that she has never smoked. She has never used smokeless tobacco. She reports that she does not drink alcohol or use drugs.  Allergies:  Allergies  Allergen Reactions  . Prednisone     Increases blood sugar     (Not in a hospital admission)  Results for orders placed or performed during the hospital encounter of 06/26/18 (from the past 48 hour(s))  Glucose, capillary     Status: Abnormal   Collection Time: 06/26/18  9:02 AM  Result Value Ref Range   Glucose-Capillary 109 (H) 70 - 99 mg/dL  Pregnancy, urine POC     Status: None   Collection Time: 06/26/18  9:03 AM  Result Value Ref Range   Preg Test, Ur NEGATIVE NEGATIVE    Comment:        THE SENSITIVITY OF THIS METHODOLOGY IS >24 mIU/mL    No results found.  Review of Systems  Constitutional: Negative.   HENT: Negative.   Eyes: Negative.   Respiratory: Negative.   Cardiovascular: Negative.    Gastrointestinal: Negative.   Musculoskeletal: Negative.   Skin: Negative.   Neurological: Negative.   Psychiatric/Behavioral: Positive for depression and suicidal ideas. Negative for hallucinations, memory loss and substance abuse. The patient is nervous/anxious and has insomnia.     Blood pressure (!) 165/95, pulse (!) 108, temperature 98.5 F (36.9 C), resp. rate 16, height 5\' 4"  (1.626 m), weight 122.5 kg, SpO2 100 %. Physical Exam  Nursing note and vitals reviewed. Constitutional: She appears well-developed and well-nourished.  HENT:  Head: Normocephalic and atraumatic.  Eyes: Pupils are equal, round, and reactive to light. Conjunctivae are normal.  Neck: Normal range of motion.  Cardiovascular: Regular rhythm and normal heart sounds.  Respiratory: Effort normal. No respiratory distress.  GI: Soft.  Musculoskeletal: Normal range of motion.  Neurological: She is alert.  Skin: Skin is warm and dry.  Psychiatric: Judgment normal. Her affect is blunt. Her speech is delayed. She is slowed. Cognition and memory are normal. She exhibits a depressed mood. She expresses suicidal ideation. She expresses no suicidal plans.     Assessment/Plan Continue ECT treatment today.  Follow-up 2 weeks.  No change to medicine.  We have been a little off schedule recently which may have resulted in her feeling more down.  I do not think she is at elevated risk for suicide such that she would need hospitalization.  [name], MD 06/26/2018, 10:12 AM

## 2018-06-26 NOTE — Discharge Instructions (Signed)
1)  The drugs that you have been given will stay in your system until tomorrow so for the       next 24 hours you should not:  A. Drive an automobile  B. Make any legal decisions  C. Drink any alcoholic beverages  2)  You may resume your regular meals upon return home.  3)  A responsible adult must take you home.  Someone should stay with you for a few          hours, then be available by phone for the remainder of the treatment day.  4)  You May experience any of the following symptoms:  Headache, Nausea and a dry mouth (due to the medications you were given),  temporary memory loss and some confusion, or sore muscles (a warm bath  should help this).  If you you experience any of these symptoms let us know on                your return visit.  5)  Report any of the following: any acute discomfort, severe headache, or temperature        greater than 100.5 F.   Also report any unusual redness, swelling, drainage, or pain         at your IV site.    You may report Symptoms to:  [place] [facility]          Phone: [phone], ECT Department           or Dr. Prescott Gum office [phone]  6)  Your next ECT Treatment is Friday August 30   We will call 2 days prior to your scheduled appointment for arrival times.  7)  Nothing to eat or drink after midnight the night before your procedure.  8)  Take    With a sip of water the morning of your procedure.  9)  Other Instructions: Call [phone] to cancel the morning of your procedure due         to illness or emergency.  10) We will call within 72 hours to assess how you are feeling.

## 2018-06-26 NOTE — Procedures (Signed)
ECT SERVICES Physician's Interval Evaluation & Treatment Note  Patient Identification: Melinda [name] MRN:  [id number] Date of Evaluation:  06/26/2018 TX #: [id number]  MADRS:   MMSE:   P.E. Findings:  No change to physical exam  Psychiatric Interval Note:  Mood depressed down negative not psychotic  Subjective:  Patient is a 47 y.o. female seen for evaluation for Electroconvulsive Therapy. Having more thoughts of self injury  Treatment Summary:   []   Right Unilateral             [x]  Bilateral   % Energy : 1.0 ms 35%   Impedance: 1110 ohms  Seizure Energy Index: 7362 V squared  Postictal Suppression Index: 62%  Seizure Concordance Index: 94%  Medications  Pre Shock: Robinul 0.4 mg Toradol 30 mg labetalol 20 mg esmolol 10 mg Brevital 70 mg succinylcholine 100 mg  Post Shock:    Seizure Duration: EMG 35 seconds EEG 90 seconds   Comments: Follow-up 2 weeks  Lungs:  [x]   Clear to auscultation               []  Other:   Heart:    [x]   Regular rhythm             []  irregular rhythm    [x]   Previous H&P reviewed, patient examined and there are NO CHANGES                 []   Previous H&P reviewed, patient examined and there are changes noted.   Melinda Berthold, MD 8/16/201910:15 AM

## 2018-06-26 NOTE — Anesthesia Postprocedure Evaluation (Signed)
Anesthesia Post Note  Patient: Melinda [name]  Procedure(s) Performed: ECT TX  Patient location during evaluation: PACU Anesthesia Type: General Level of consciousness: awake and alert Pain management: pain level controlled Vital Signs Assessment: post-procedure vital signs reviewed and stable Respiratory status: spontaneous breathing, nonlabored ventilation and respiratory function stable Cardiovascular status: blood pressure returned to baseline and stable Postop Assessment: no signs of nausea or vomiting Anesthetic complications: no     Last Vitals:  Vitals:   06/26/18 1110 06/26/18 1127  BP:  137/89  Pulse: 92 91  Resp: (!) 31 16  Temp: 36.7 C 36.6 C  SpO2: 95%     Last Pain:  Vitals:   06/26/18 1127  TempSrc: Oral  PainSc: 0-No pain                 Aymara Sassi

## 2018-07-10 ENCOUNTER — Other Ambulatory Visit: Payer: Self-pay | Admitting: Psychiatry

## 2018-07-10 ENCOUNTER — Encounter: Payer: Self-pay | Admitting: Certified Registered Nurse Anesthetist

## 2018-07-10 ENCOUNTER — Encounter (HOSPITAL_COMMUNITY)
Admission: RE | Admit: 2018-07-10 | Discharge: 2018-07-10 | Disposition: A | Discharge status: Home / Self Care | Payer: Medicare PPO | Source: Ambulatory Visit | Attending: Psychiatry | Admitting: Psychiatry

## 2018-07-10 DIAGNOSIS — I10 Essential (primary) hypertension: Secondary | ICD-10-CM | POA: Diagnosis not present

## 2018-07-10 DIAGNOSIS — F332 Major depressive disorder, recurrent severe without psychotic features: Secondary | ICD-10-CM

## 2018-07-10 DIAGNOSIS — E7800 Pure hypercholesterolemia, unspecified: Secondary | ICD-10-CM | POA: Diagnosis not present

## 2018-07-10 DIAGNOSIS — E1142 Type 2 diabetes mellitus with diabetic polyneuropathy: Secondary | ICD-10-CM | POA: Diagnosis not present

## 2018-07-10 DIAGNOSIS — K219 Gastro-esophageal reflux disease without esophagitis: Secondary | ICD-10-CM | POA: Diagnosis not present

## 2018-07-10 DIAGNOSIS — G4730 Sleep apnea, unspecified: Secondary | ICD-10-CM | POA: Diagnosis not present

## 2018-07-10 DIAGNOSIS — Z888 Allergy status to other drugs, medicaments and biological substances status: Secondary | ICD-10-CM | POA: Diagnosis not present

## 2018-07-10 DIAGNOSIS — Z9889 Other specified postprocedural states: Secondary | ICD-10-CM | POA: Diagnosis not present

## 2018-07-10 DIAGNOSIS — Z8249 Family history of ischemic heart disease and other diseases of the circulatory system: Secondary | ICD-10-CM | POA: Diagnosis not present

## 2018-07-10 DIAGNOSIS — F339 Major depressive disorder, recurrent, unspecified: Secondary | ICD-10-CM | POA: Diagnosis not present

## 2018-07-10 DIAGNOSIS — Z833 Family history of diabetes mellitus: Secondary | ICD-10-CM | POA: Diagnosis not present

## 2018-07-10 DIAGNOSIS — E1140 Type 2 diabetes mellitus with diabetic neuropathy, unspecified: Secondary | ICD-10-CM | POA: Diagnosis not present

## 2018-07-10 LAB — GLUCOSE, CAPILLARY: Glucose-Capillary: 121 mg/dL — ABNORMAL HIGH (ref 70–99)

## 2018-07-10 MED ORDER — ESMOLOL HCL 100 MG/10ML IV SOLN
INTRAVENOUS | Status: AC
  Filled 2018-07-10: qty 10

## 2018-07-10 MED ORDER — LABETALOL HCL 5 MG/ML IV SOLN
INTRAVENOUS | Status: AC
  Filled 2018-07-10: qty 4

## 2018-07-10 MED ORDER — SUCCINYLCHOLINE CHLORIDE 20 MG/ML IJ SOLN
INTRAMUSCULAR | Status: AC
  Filled 2018-07-10: qty 1

## 2018-07-10 MED ORDER — METHOHEXITAL SODIUM 100 MG/10ML IV SOSY
PREFILLED_SYRINGE | INTRAVENOUS | Status: DC | PRN
  Administered 2018-07-10: 70 mg via INTRAVENOUS

## 2018-07-10 MED ORDER — GLYCOPYRROLATE 0.2 MG/ML IJ SOLN
INTRAMUSCULAR | Status: AC
  Filled 2018-07-10: qty 2

## 2018-07-10 MED ORDER — KETOROLAC TROMETHAMINE 30 MG/ML IJ SOLN
INTRAMUSCULAR | Status: AC
  Administered 2018-07-10: 30 mg via INTRAVENOUS
  Filled 2018-07-10: qty 1

## 2018-07-10 MED ORDER — KETOROLAC TROMETHAMINE 30 MG/ML IJ SOLN
30.0000 mg | Freq: Once | INTRAMUSCULAR | Status: AC
  Administered 2018-07-10: 30 mg via INTRAVENOUS

## 2018-07-10 MED ORDER — LABETALOL HCL 5 MG/ML IV SOLN
INTRAVENOUS | Status: DC | PRN
  Administered 2018-07-10: 20 mg via INTRAVENOUS

## 2018-07-10 MED ORDER — SUCCINYLCHOLINE CHLORIDE 20 MG/ML IJ SOLN
INTRAMUSCULAR | Status: DC | PRN
  Administered 2018-07-10: 100 mg via INTRAVENOUS

## 2018-07-10 MED ORDER — GLYCOPYRROLATE 0.2 MG/ML IJ SOLN
0.4000 mg | Freq: Once | INTRAMUSCULAR | Status: AC
  Administered 2018-07-10: 0.4 mg via INTRAVENOUS

## 2018-07-10 MED ORDER — ESMOLOL HCL 100 MG/10ML IV SOLN
INTRAVENOUS | Status: DC | PRN
  Administered 2018-07-10: 20 mg via INTRAVENOUS

## 2018-07-10 MED ORDER — SODIUM CHLORIDE 0.9 % IV SOLN
INTRAVENOUS | Status: DC | PRN
  Administered 2018-07-10: 11:00:00 via INTRAVENOUS

## 2018-07-10 MED ORDER — METHOHEXITAL SODIUM 0.5 G IJ SOLR
INTRAMUSCULAR | Status: AC
  Filled 2018-07-10: qty 500

## 2018-07-10 MED ORDER — SODIUM CHLORIDE 0.9 % IV SOLN
500.0000 mL | Freq: Once | INTRAVENOUS | Status: AC
  Administered 2018-07-10: 500 mL via INTRAVENOUS

## 2018-07-10 NOTE — H&P (Signed)
Melinda [name] is an 47 y.o. female.   Chief Complaint: Patient has chronic depressed mood.  Passive thoughts of self injury no suicidal ideation.  No specific new physical complaint HPI: History of recurrent severe depression held at [place] in part with maintenance ECT  Past Medical History:  Diagnosis Date  . Depression   . Diabetes mellitus without complication ([place])   . Diabetic peripheral neuropathy ([place]) 03/07/15  . Diabetic peripheral neuropathy ([place]) 03/07/15  . Diabetic peripheral neuropathy ([place]) 03/07/15  . GERD (gastroesophageal reflux disease)   . Hypercholesterolemia 03/07/15  . Hypertension   . Obesity 03/07/15  . Personality disorder ([place]) 03/07/15  . Sinus tachycardia 03/07/15   history of  . Suicidal thoughts 03/07/15    Past Surgical History:  Procedure Laterality Date  . electroconvulsion therapy  03/07/15    Family History  Problem Relation Age of Onset  . Hypertension Father   . Diabetes Mother    Social History:  reports that she has never smoked. She has never used smokeless tobacco. She reports that she does not drink alcohol or use drugs.  Allergies:  Allergies  Allergen Reactions  . Prednisone     Increases blood sugar     (Not in a hospital admission)  Results for orders placed or performed during the hospital encounter of 07/10/18 (from the past 48 hour(s))  Glucose, capillary     Status: Abnormal   Collection Time: 07/10/18  8:32 AM  Result Value Ref Range   Glucose-Capillary 121 (H) 70 - 99 mg/dL   No results found.  Review of Systems  Constitutional: Negative.   HENT: Negative.   Eyes: Negative.   Respiratory: Negative.   Cardiovascular: Negative.   Gastrointestinal: Negative.   Musculoskeletal: Negative.   Skin: Negative.   Neurological: Negative.   Psychiatric/Behavioral: Positive for depression. Negative for hallucinations, memory loss, substance abuse and suicidal ideas. The patient is not nervous/anxious and does not have  insomnia.     Blood pressure (!) 158/88, pulse 100, temperature 97.9 F (36.6 C), temperature source Oral, resp. rate 16, height 5\' 4"  (1.626 m), weight 121.1 kg. Physical Exam  Nursing note and vitals reviewed. Constitutional: She appears well-developed and well-nourished.  HENT:  Head: Normocephalic and atraumatic.  Eyes: Pupils are equal, round, and reactive to light. Conjunctivae are normal.  Neck: Normal range of motion.  Cardiovascular: Regular rhythm and normal heart sounds.  Respiratory: Effort normal.  GI: Soft.  Musculoskeletal: Normal range of motion.  Neurological: She is alert.  Skin: Skin is warm and dry.  Psychiatric: Judgment normal. Her affect is blunt. Her speech is delayed. She is slowed. Cognition and memory are normal. She expresses no suicidal ideation.     Assessment/Plan Treatment today follow-up as per the usual schedule in 2 weeks.  We did note that her blood pressure was higher today than usual and we will keep an eye on that since she had discontinued previous antihypertensive medicine.  [name], MD 07/10/2018, 10:45 AM

## 2018-07-10 NOTE — Anesthesia Post-op Follow-up Note (Signed)
Anesthesia QCDR form completed.        

## 2018-07-10 NOTE — Discharge Instructions (Signed)
1)  The drugs that you have been given will stay in your system until tomorrow so for the       next 24 hours you should not:  A. Drive an automobile  B. Make any legal decisions  C. Drink any alcoholic beverages  2)  You may resume your regular meals upon return home.  3)  A responsible adult must take you home.  Someone should stay with you for a few          hours, then be available by phone for the remainder of the treatment day.  4)  You May experience any of the following symptoms:  Headache, Nausea and a dry mouth (due to the medications you were given),  temporary memory loss and some confusion, or sore muscles (a warm bath  should help this).  If you you experience any of these symptoms let us know on                your return visit.  5)  Report any of the following: any acute discomfort, severe headache, or temperature        greater than 100.5 F.   Also report any unusual redness, swelling, drainage, or pain         at your IV site.    You may report Symptoms to:  [place] at [facility]          Phone: [phone], ECT Department           or Dr. Prescott Gum office [phone]  6)  Your next ECT Treatment is Friday September 13   We will call 2 days prior to your scheduled appointment for arrival times.  7)  Nothing to eat or drink after midnight the night before your procedure.  8)  Take    With a sip of water the morning of your procedure.  9)  Other Instructions: Call [phone] to cancel the morning of your procedure due         to illness or emergency.  10) We will call within 72 hours to assess how you are feeling.

## 2018-07-10 NOTE — Anesthesia Preprocedure Evaluation (Signed)
Anesthesia Evaluation  Patient identified by MRN, date of birth, ID band Patient awake    Reviewed: Allergy & Precautions, H&P , NPO status , Patient's Chart, lab work & pertinent test results  History of Anesthesia Complications Negative for: history of anesthetic complications  Airway Mallampati: II  TM Distance: >3 FB Neck ROM: full    Dental  (+) Poor Dentition, Chipped   Pulmonary sleep apnea , neg COPD,    Pulmonary exam normal breath sounds clear to auscultation       Cardiovascular hypertension, Pt. on medications (-) CAD and (-) Past MI negative cardio ROS Normal cardiovascular exam Rhythm:regular Rate:Normal     Neuro/Psych PSYCHIATRIC DISORDERS Depression Bipolar Disorder  Neuromuscular disease negative neurological ROS     GI/Hepatic negative GI ROS, Neg liver ROS, GERD  Medicated,  Endo/Other  diabetes, Type 2, Oral Hypoglycemic Agents  Renal/GU negative Renal ROS  negative genitourinary   Musculoskeletal   Abdominal (+) + obese,   Peds  Hematology negative hematology ROS (+)   Anesthesia Other Findings Past Medical History: No date: Depression No date: Diabetes mellitus without complication ([facility]) 03/07/15: Diabetic peripheral neuropathy ([facility]) 03/07/15: Diabetic peripheral neuropathy ([facility]) 03/07/15: Diabetic peripheral neuropathy ([facility]) No date: GERD (gastroesophageal reflux disease) 03/07/15: Hypercholesterolemia No date: Hypertension 03/07/15: Obesity 03/07/15: Personality disorder 03/07/15: Sinus tachycardia ([facility])     Comment: history of 03/07/15: Suicidal thoughts Past Surgical History: 03/07/15: electroconvulsion therapy BMI    Body Mass Index:  36.44 kg/m     Reproductive/Obstetrics                             Anesthesia Physical  Anesthesia Plan  ASA: III  Anesthesia Plan: General   Post-op Pain Management:    Induction: Intravenous  PONV Risk Score and  Plan: 2 and Ondansetron  Airway Management Planned: Mask  Additional Equipment:   Intra-op Plan:   Post-operative Plan:   Informed Consent: I have reviewed the patients History and Physical, chart, labs and discussed the procedure including the risks, benefits and alternatives for the proposed anesthesia with the patient or authorized representative who has indicated his/her understanding and acceptance.     Dental Advisory Given  Plan Discussed with: CRNA and Anesthesiologist  Anesthesia Plan Comments:         Anesthesia Quick Evaluation  

## 2018-07-10 NOTE — Anesthesia Postprocedure Evaluation (Signed)
Anesthesia Post Note  Patient: Melinda [name]  Procedure(s) Performed: ECT TX  Patient location during evaluation: PACU Anesthesia Type: General Level of consciousness: awake and alert and oriented Pain management: pain level controlled Vital Signs Assessment: post-procedure vital signs reviewed and stable Respiratory status: spontaneous breathing Cardiovascular status: blood pressure returned to baseline Anesthetic complications: no     Last Vitals:  Vitals:   07/10/18 1128 07/10/18 1141  BP: 132/79 123/69  Pulse: 84 75  Resp:  18  Temp: 36.8 C 36.8 C  SpO2: 94%     Last Pain:  Vitals:   07/10/18 1141  TempSrc: Oral  PainSc: 0-No pain                 Bridgett Hattabaugh

## 2018-07-10 NOTE — Procedures (Signed)
ECT SERVICES Physician's Interval Evaluation & Treatment Note  Patient Identification: Melinda [name] MRN:  [id number] Date of Evaluation:  07/10/2018 TX #: [id number]  MADRS:   MMSE:   P.E. Findings:  No change to physical exam  Psychiatric Interval Note:  Chronic mild depression  Subjective:  Patient is a 47 y.o. female seen for evaluation for Electroconvulsive Therapy. Usual complaints nothing new physically  Treatment Summary:   []   Right Unilateral             [x]  Bilateral   % Energy : 1.0 ms 35%   Impedance: 1090 ohms  Seizure Energy Index: 8450 V squared  Postictal Suppression Index: 83%  Seizure Concordance Index: 93%  Medications  Pre Shock: 0.4 mg Toradol 30 mg labetalol 20 mg esmolol 10 mg Brevital 70 mg succinylcholine 100 mg  Post Shock:    Seizure Duration: 51 seconds by EMG 141 seconds by EEG   Comments: Follow-up 2 weeks  Lungs:  [x]   Clear to auscultation               []  Other:   Heart:    [x]   Regular rhythm             []  irregular rhythm    [x]   Previous H&P reviewed, patient examined and there are NO CHANGES                 []   Previous H&P reviewed, patient examined and there are changes noted.   [name], MD 8/30/201910:47 AM

## 2018-07-10 NOTE — Anesthesia Procedure Notes (Signed)
Procedure Name: General with mask airway Date/Time: 07/10/2018 10:50 AM Performed by: Johnna Acosta, CRNA Pre-anesthesia Checklist: Patient identified, Emergency Drugs available, Suction available, Patient being monitored and Timeout performed Patient Re-evaluated:Patient Re-evaluated prior to induction Oxygen Delivery Method: Circle system utilized Preoxygenation: Pre-oxygenation with 100% oxygen Induction Type: IV induction Ventilation: Mask ventilation without difficulty

## 2018-07-10 NOTE — Transfer of Care (Signed)
Immediate Anesthesia Transfer of Care Note  Patient: Melinda [name]  Procedure(s) Performed: ECT TX  Patient Location: PACU  Anesthesia Type:General  Level of Consciousness: awake and drowsy  Airway & Oxygen Therapy: Patient Spontanous Breathing and Patient connected to face mask oxygen  Post-op Assessment: Report given to RN and Post -op Vital signs reviewed and stable  Post vital signs: Reviewed and stable  Last Vitals:  Vitals Value Taken Time  BP    Temp    Pulse 100 07/10/2018 11:08 AM  Resp 17 07/10/2018 11:08 AM  SpO2 97 % 07/10/2018 11:08 AM  Vitals shown include unvalidated device data.  Last Pain:  Vitals:   07/10/18 0835  TempSrc: Oral  PainSc: 0-No pain         Complications: No apparent anesthesia complications

## 2018-07-22 ENCOUNTER — Telehealth: Payer: Self-pay | Admitting: *Deleted

## 2018-07-23 ENCOUNTER — Telehealth: Payer: Self-pay | Admitting: *Deleted

## 2018-07-24 ENCOUNTER — Encounter
Admission: RE | Admit: 2018-07-24 | Discharge: 2018-07-24 | Disposition: A | Discharge status: Home / Self Care | Payer: Medicare PPO | Source: Ambulatory Visit | Attending: Psychiatry | Admitting: Psychiatry

## 2018-07-24 ENCOUNTER — Other Ambulatory Visit: Payer: Self-pay | Admitting: Psychiatry

## 2018-07-24 ENCOUNTER — Encounter: Payer: Self-pay | Admitting: Anesthesiology

## 2018-07-24 DIAGNOSIS — K219 Gastro-esophageal reflux disease without esophagitis: Secondary | ICD-10-CM | POA: Diagnosis not present

## 2018-07-24 DIAGNOSIS — F332 Major depressive disorder, recurrent severe without psychotic features: Secondary | ICD-10-CM | POA: Diagnosis not present

## 2018-07-24 DIAGNOSIS — Z79899 Other long term (current) drug therapy: Secondary | ICD-10-CM | POA: Diagnosis not present

## 2018-07-24 DIAGNOSIS — F333 Major depressive disorder, recurrent, severe with psychotic symptoms: Secondary | ICD-10-CM | POA: Diagnosis not present

## 2018-07-24 DIAGNOSIS — E1140 Type 2 diabetes mellitus with diabetic neuropathy, unspecified: Secondary | ICD-10-CM | POA: Diagnosis not present

## 2018-07-24 DIAGNOSIS — I10 Essential (primary) hypertension: Secondary | ICD-10-CM | POA: Diagnosis not present

## 2018-07-24 DIAGNOSIS — G4730 Sleep apnea, unspecified: Secondary | ICD-10-CM | POA: Diagnosis not present

## 2018-07-24 LAB — GLUCOSE, CAPILLARY: Glucose-Capillary: 118 mg/dL — ABNORMAL HIGH (ref 70–99)

## 2018-07-24 LAB — POCT PREGNANCY, URINE: Preg Test, Ur: NEGATIVE

## 2018-07-24 MED ORDER — LABETALOL HCL 5 MG/ML IV SOLN
INTRAVENOUS | Status: DC | PRN
  Administered 2018-07-24: 20 mg via INTRAVENOUS

## 2018-07-24 MED ORDER — KETOROLAC TROMETHAMINE 30 MG/ML IJ SOLN
INTRAMUSCULAR | Status: AC
  Administered 2018-07-24: 30 mg via INTRAVENOUS
  Filled 2018-07-24: qty 1

## 2018-07-24 MED ORDER — ESMOLOL HCL 100 MG/10ML IV SOLN
INTRAVENOUS | Status: DC | PRN
  Administered 2018-07-24: 20 mg via INTRAVENOUS

## 2018-07-24 MED ORDER — KETOROLAC TROMETHAMINE 30 MG/ML IJ SOLN
30.0000 mg | Freq: Once | INTRAMUSCULAR | Status: AC
  Administered 2018-07-24: 30 mg via INTRAVENOUS

## 2018-07-24 MED ORDER — GLYCOPYRROLATE 0.2 MG/ML IJ SOLN
0.4000 mg | Freq: Once | INTRAMUSCULAR | Status: AC
  Administered 2018-07-24: 0.4 mg via INTRAVENOUS

## 2018-07-24 MED ORDER — GLYCOPYRROLATE 0.2 MG/ML IJ SOLN
INTRAMUSCULAR | Status: AC
  Administered 2018-07-24: 0.4 mg via INTRAVENOUS
  Filled 2018-07-24: qty 2

## 2018-07-24 MED ORDER — SODIUM CHLORIDE 0.9 % IV SOLN
500.0000 mL | Freq: Once | INTRAVENOUS | Status: AC
  Administered 2018-07-24: 500 mL via INTRAVENOUS

## 2018-07-24 MED ORDER — SODIUM CHLORIDE 0.9 % IV SOLN
INTRAVENOUS | Status: DC | PRN
  Administered 2018-07-24: 11:00:00 via INTRAVENOUS

## 2018-07-24 MED ORDER — SUCCINYLCHOLINE CHLORIDE 200 MG/10ML IV SOSY
PREFILLED_SYRINGE | INTRAVENOUS | Status: DC | PRN
  Administered 2018-07-24: 100 mg via INTRAVENOUS

## 2018-07-24 MED ORDER — METHOHEXITAL SODIUM 100 MG/10ML IV SOSY
PREFILLED_SYRINGE | INTRAVENOUS | Status: DC | PRN
  Administered 2018-07-24: 70 mg via INTRAVENOUS

## 2018-07-24 NOTE — Anesthesia Preprocedure Evaluation (Signed)
Anesthesia Evaluation  Patient identified by MRN, date of birth, ID band Patient awake    Reviewed: Allergy & Precautions, H&P , NPO status , Patient's Chart, lab work & pertinent test results  History of Anesthesia Complications Negative for: history of anesthetic complications  Airway Mallampati: II  TM Distance: >3 FB Neck ROM: full    Dental  (+) Poor Dentition, Chipped   Pulmonary sleep apnea , neg COPD,    Pulmonary exam normal breath sounds clear to auscultation       Cardiovascular hypertension, Pt. on medications (-) CAD and (-) Past MI negative cardio ROS Normal cardiovascular exam Rhythm:regular Rate:Normal     Neuro/Psych PSYCHIATRIC DISORDERS Depression Bipolar Disorder  Neuromuscular disease negative neurological ROS     GI/Hepatic negative GI ROS, Neg liver ROS, GERD  Controlled,  Endo/Other  diabetes, Type 2, Oral Hypoglycemic Agents  Renal/GU negative Renal ROS  negative genitourinary   Musculoskeletal   Abdominal (+) + obese,   Peds  Hematology negative hematology ROS (+)   Anesthesia Other Findings Past Medical History: No date: Depression No date: Diabetes mellitus without complication (HCC) 0/25/85: Diabetic peripheral neuropathy ([place]) 03/07/15: Diabetic peripheral neuropathy ([place]) 03/07/15: Diabetic peripheral neuropathy ([facility]) No date: GERD (gastroesophageal reflux disease) 03/07/15: Hypercholesterolemia No date: Hypertension 03/07/15: Obesity 03/07/15: Personality disorder 03/07/15: Sinus tachycardia ([place])     Comment: history of 03/07/15: Suicidal thoughts Past Surgical History: 03/07/15: electroconvulsion therapy BMI    Body Mass Index:  36.44 kg/m     Reproductive/Obstetrics                             Anesthesia Physical  Anesthesia Plan  ASA: III  Anesthesia Plan: General   Post-op Pain Management:    Induction: Intravenous  PONV Risk Score and  Plan:   Airway Management Planned: Mask  Additional Equipment:   Intra-op Plan:   Post-operative Plan:   Informed Consent: I have reviewed the patients History and Physical, chart, labs and discussed the procedure including the risks, benefits and alternatives for the proposed anesthesia with the patient or authorized representative who has indicated his/her understanding and acceptance.   Dental Advisory Given  Plan Discussed with: CRNA and Anesthesiologist  Anesthesia Plan Comments: (Patient consented for risks of anesthesia including but not limited to:  - adverse reactions to medications - damage to teeth, lips or other oral mucosa - sore throat or hoarseness - Damage to heart, brain, lungs or loss of life  Patient voiced understanding.)        Anesthesia Quick Evaluation

## 2018-07-24 NOTE — Procedures (Signed)
ECT SERVICES Physician's Interval Evaluation & Treatment Note  Patient Identification: Melinda [name] MRN:  [id number] Date of Evaluation:  07/24/2018 TX #: [id number]  MADRS: 13  MMSE: 30  P.E. Findings:  No change to physical exam vitals unremarkable heart and lungs clear.  Psychiatric Interval Note:  Chronic irritability and depression low mood  Subjective:  Patient is a 47 y.o. female seen for evaluation for Electroconvulsive Therapy. Had a bad morning today because of frustration.  Treatment Summary:   []   Right Unilateral             [x]  Bilateral   % Energy : 1.0 ms 35%   Impedance: 900 ohms  Seizure Energy Index: 9002 V squared  Postictal Suppression Index: 80%  Seizure Concordance Index: 98%  Medications  Pre Shock: Robinul 0.4 mg Toradol 30 mg labetalol 20 mg esmolol 10 mg Brevital 70 mg succinylcholine 100 mg  Post Shock:    Seizure Duration: 38 seconds by EMG, 83 seconds by EEG   Comments: Follow-up 2 weeks  Lungs:  [x]   Clear to auscultation               []  Other:   Heart:    [x]   Regular rhythm             []  irregular rhythm    [x]   Previous H&P reviewed, patient examined and there are NO CHANGES                 []   Previous H&P reviewed, patient examined and there are changes noted.   [name], MD 9/13/201911:49 AM

## 2018-07-24 NOTE — Discharge Instructions (Signed)
1)  The drugs that you have been given will stay in your system until tomorrow so for the       next 24 hours you should not:  A. Drive an automobile  B. Make any legal decisions  C. Drink any alcoholic beverages  2)  You may resume your regular meals upon return home.  3)  A responsible adult must take you home.  Someone should stay with you for a few          hours, then be available by phone for the remainder of the treatment day.  4)  You May experience any of the following symptoms:  Headache, Nausea and a dry mouth (due to the medications you were given),  temporary memory loss and some confusion, or sore muscles (a warm bath  should help this).  If you you experience any of these symptoms let us know on                your return visit.  5)  Report any of the following: any acute discomfort, severe headache, or temperature        greater than 100.5 F.   Also report any unusual redness, swelling, drainage, or pain         at your IV site.    You may report Symptoms to:  [place] at [facility]          Phone: [phone], ECT Department           or Dr. Prescott Gum office [phone]  6)  Your next ECT Treatment is Day Friday Date August 07, 2018 8am  We will call 2 days prior to your scheduled appointment for arrival times.  7)  Nothing to eat or drink after midnight the night before your procedure.  8)  Take.     With a sip of water the morning of your procedure.  9)  Other Instructions: Call [phone] to cancel the morning of your procedure due         to illness or emergency.  10) We will call within 72 hours to assess how you are feeling.

## 2018-07-24 NOTE — Anesthesia Procedure Notes (Signed)
Performed by: Mirtie Bastyr, CRNA Pre-anesthesia Checklist: Patient identified, Patient being monitored, Timeout performed, Emergency Drugs available and Suction available Patient Re-evaluated:Patient Re-evaluated prior to induction Oxygen Delivery Method: Circle system utilized Preoxygenation: Pre-oxygenation with 100% oxygen Ventilation: Mask ventilation without difficulty Airway Equipment and Method: Bite block Dental Injury: Teeth and Oropharynx as per pre-operative assessment        

## 2018-07-24 NOTE — Anesthesia Post-op Follow-up Note (Signed)
Anesthesia QCDR form completed.        

## 2018-07-24 NOTE — Transfer of Care (Signed)
Immediate Anesthesia Transfer of Care Note  Patient: Melinda [name]  Procedure(s) Performed: ECT TX  Patient Location: PACU  Anesthesia Type:General  Level of Consciousness: drowsy  Airway & Oxygen Therapy: Patient Spontanous Breathing and Patient connected to face mask oxygen  Post-op Assessment: Report given to RN and Post -op Vital signs reviewed and stable  Post vital signs: Reviewed and stable  Last Vitals:  Vitals Value Taken Time  BP    Temp    Pulse    Resp    SpO2      Last Pain:  Vitals:   07/24/18 1115  TempSrc:   PainSc: 0-No pain         Complications: No apparent anesthesia complications

## 2018-07-24 NOTE — H&P (Signed)
Melinda [name] is an 47 y.o. female.   Chief Complaint: Chronic depression HPI: History of ongoing chronic depression with reports of partial improvement with ECT and medicine  Past Medical History:  Diagnosis Date  . Depression   . Diabetes mellitus without complication ([place])   . Diabetic peripheral neuropathy ([place]) 03/07/15  . Diabetic peripheral neuropathy ([place]) 03/07/15  . Diabetic peripheral neuropathy ([place]) 03/07/15  . GERD (gastroesophageal reflux disease)   . Hypercholesterolemia 03/07/15  . Hypertension   . Obesity 03/07/15  . Personality disorder ([place]) 03/07/15  . Sinus tachycardia 03/07/15   history of  . Suicidal thoughts 03/07/15    Past Surgical History:  Procedure Laterality Date  . electroconvulsion therapy  03/07/15    Family History  Problem Relation Age of Onset  . Hypertension Father   . Diabetes Mother    Social History:  reports that she has never smoked. She has never used smokeless tobacco. She reports that she does not drink alcohol or use drugs.  Allergies:  Allergies  Allergen Reactions  . Prednisone     Increases blood sugar     (Not in a hospital admission)  Results for orders placed or performed during the hospital encounter of 07/24/18 (from the past 48 hour(s))  Pregnancy, urine POC     Status: None   Collection Time: 07/24/18  9:37 AM  Result Value Ref Range   Preg Test, Ur NEGATIVE NEGATIVE    Comment:        THE SENSITIVITY OF THIS METHODOLOGY IS >24 mIU/mL   Glucose, capillary     Status: Abnormal   Collection Time: 07/24/18  9:45 AM  Result Value Ref Range   Glucose-Capillary 118 (H) 70 - 99 mg/dL   No results found.  Review of Systems  Constitutional: Negative.   HENT: Negative.   Eyes: Negative.   Respiratory: Negative.   Cardiovascular: Negative.   Gastrointestinal: Negative.   Musculoskeletal: Negative.   Skin: Negative.   Neurological: Negative.   Psychiatric/Behavioral: Positive for depression. Negative for  hallucinations, memory loss, substance abuse and suicidal ideas. The patient is not nervous/anxious and does not have insomnia.     Blood pressure (!) 159/96, pulse 100, temperature (!) 96.7 F (35.9 C), temperature source Oral, resp. rate 18, height 5\' 4"  (1.626 m), weight 121.6 kg, last menstrual period 05/27/2018, SpO2 98 %. Physical Exam  Nursing note and vitals reviewed. Constitutional: She appears well-developed and well-nourished.  HENT:  Head: Normocephalic and atraumatic.  Eyes: Pupils are equal, round, and reactive to light. Conjunctivae are normal.  Neck: Normal range of motion.  Cardiovascular: Regular rhythm and normal heart sounds.  Respiratory: Effort normal. No respiratory distress.  GI: Soft.  Musculoskeletal: Normal range of motion.  Neurological: She is alert.  Skin: Skin is warm and dry.  Psychiatric: Her speech is delayed. She is slowed. Cognition and memory are normal. She expresses impulsivity. She exhibits a depressed mood. She expresses no suicidal ideation.     Assessment/Plan Treatment currently on 2-week schedule  [name], MD 07/24/2018, 11:48 AM

## 2018-07-24 NOTE — Anesthesia Postprocedure Evaluation (Signed)
Anesthesia Post Note  Patient: Melinda [name]  Procedure(s) Performed: ECT TX  Patient location during evaluation: PACU Anesthesia Type: General Level of consciousness: awake and alert Pain management: pain level controlled Vital Signs Assessment: post-procedure vital signs reviewed and stable Respiratory status: spontaneous breathing and respiratory function stable Cardiovascular status: stable Anesthetic complications: no     Last Vitals:  Vitals:   07/24/18 1218 07/24/18 1228  BP: (!) 128/59 119/66  Pulse: 99 96  Resp: 15 (!) 21  Temp:    SpO2: 98% 97%    Last Pain:  Vitals:   07/24/18 1228  TempSrc:   PainSc: 0-No pain                 Esa Raden K

## 2018-08-06 ENCOUNTER — Other Ambulatory Visit: Payer: Self-pay | Admitting: Psychiatry

## 2018-08-07 ENCOUNTER — Encounter (HOSPITAL_COMMUNITY)
Admission: RE | Admit: 2018-08-07 | Discharge: 2018-08-07 | Disposition: A | Discharge status: Home / Self Care | Payer: Medicare PPO | Source: Ambulatory Visit | Attending: Psychiatry | Admitting: Psychiatry

## 2018-08-07 ENCOUNTER — Encounter: Payer: Self-pay | Admitting: Anesthesiology

## 2018-08-07 DIAGNOSIS — F333 Major depressive disorder, recurrent, severe with psychotic symptoms: Secondary | ICD-10-CM | POA: Diagnosis not present

## 2018-08-07 DIAGNOSIS — F332 Major depressive disorder, recurrent severe without psychotic features: Secondary | ICD-10-CM

## 2018-08-07 DIAGNOSIS — F329 Major depressive disorder, single episode, unspecified: Secondary | ICD-10-CM | POA: Diagnosis not present

## 2018-08-07 DIAGNOSIS — I10 Essential (primary) hypertension: Secondary | ICD-10-CM | POA: Diagnosis not present

## 2018-08-07 DIAGNOSIS — Z79899 Other long term (current) drug therapy: Secondary | ICD-10-CM | POA: Diagnosis not present

## 2018-08-07 DIAGNOSIS — K219 Gastro-esophageal reflux disease without esophagitis: Secondary | ICD-10-CM | POA: Diagnosis not present

## 2018-08-07 DIAGNOSIS — G4730 Sleep apnea, unspecified: Secondary | ICD-10-CM | POA: Diagnosis not present

## 2018-08-07 DIAGNOSIS — E119 Type 2 diabetes mellitus without complications: Secondary | ICD-10-CM | POA: Diagnosis not present

## 2018-08-07 LAB — GLUCOSE, CAPILLARY
Glucose-Capillary: 110 mg/dL — ABNORMAL HIGH (ref 70–99)
Glucose-Capillary: 121 mg/dL — ABNORMAL HIGH (ref 70–99)

## 2018-08-07 LAB — POCT PREGNANCY, URINE: Preg Test, Ur: NEGATIVE

## 2018-08-07 MED ORDER — ESMOLOL HCL 100 MG/10ML IV SOLN
INTRAVENOUS | Status: AC
  Filled 2018-08-07: qty 10

## 2018-08-07 MED ORDER — SODIUM CHLORIDE 0.9 % IV SOLN
500.0000 mL | Freq: Once | INTRAVENOUS | Status: AC
  Administered 2018-08-07: 09:00:00 via INTRAVENOUS

## 2018-08-07 MED ORDER — ONDANSETRON HCL 4 MG/2ML IJ SOLN
INTRAMUSCULAR | Status: AC
  Filled 2018-08-07: qty 2

## 2018-08-07 MED ORDER — LABETALOL HCL 5 MG/ML IV SOLN
INTRAVENOUS | Status: AC
  Filled 2018-08-07: qty 4

## 2018-08-07 MED ORDER — KETOROLAC TROMETHAMINE 30 MG/ML IJ SOLN: 30.0000 mg | Freq: Once | INTRAMUSCULAR | Status: DC

## 2018-08-07 MED ORDER — ESMOLOL HCL 100 MG/10ML IV SOLN
INTRAVENOUS | Status: DC | PRN
  Administered 2018-08-07: 20 mg via INTRAVENOUS

## 2018-08-07 MED ORDER — GLYCOPYRROLATE 0.2 MG/ML IJ SOLN
INTRAMUSCULAR | Status: AC
  Filled 2018-08-07: qty 2

## 2018-08-07 MED ORDER — GLYCOPYRROLATE 0.2 MG/ML IJ SOLN: 0.4000 mg | Freq: Once | INTRAMUSCULAR | Status: DC

## 2018-08-07 MED ORDER — SUCCINYLCHOLINE CHLORIDE 20 MG/ML IJ SOLN
INTRAMUSCULAR | Status: AC
  Filled 2018-08-07: qty 1

## 2018-08-07 MED ORDER — KETOROLAC TROMETHAMINE 30 MG/ML IJ SOLN
INTRAMUSCULAR | Status: AC
  Filled 2018-08-07: qty 1

## 2018-08-07 MED ORDER — SUCCINYLCHOLINE CHLORIDE 20 MG/ML IJ SOLN
INTRAMUSCULAR | Status: DC | PRN
  Administered 2018-08-07: 100 mg via INTRAVENOUS

## 2018-08-07 MED ORDER — METHOHEXITAL SODIUM 100 MG/10ML IV SOSY
PREFILLED_SYRINGE | INTRAVENOUS | Status: DC | PRN
  Administered 2018-08-07: 70 mg via INTRAVENOUS

## 2018-08-07 MED ORDER — LABETALOL HCL 5 MG/ML IV SOLN
INTRAVENOUS | Status: DC | PRN
  Administered 2018-08-07: 20 mg via INTRAVENOUS

## 2018-08-07 NOTE — H&P (Signed)
Melinda [name] is an 47 y.o. female.   Chief Complaint: Vision has no new complaint.  Chronic depression.  Ups and downs.  Today feeling a little more down and negative.  Talking about wishing that she had pain but denies any suicidal intent HPI: History of long-standing recurrent depression with improvement from periodic maintenance ECT  Past Medical History:  Diagnosis Date  . Depression   . Diabetes mellitus without complication ([place])   . Diabetic peripheral neuropathy ([place]) 03/07/15  . Diabetic peripheral neuropathy ([place]) 03/07/15  . Diabetic peripheral neuropathy ([place]) 03/07/15  . GERD (gastroesophageal reflux disease)   . Hypercholesterolemia 03/07/15  . Hypertension   . Obesity 03/07/15  . Personality disorder ([place]) 03/07/15  . Sinus tachycardia 03/07/15   history of  . Suicidal thoughts 03/07/15    Past Surgical History:  Procedure Laterality Date  . electroconvulsion therapy  03/07/15    Family History  Problem Relation Age of Onset  . Hypertension Father   . Diabetes Mother    Social History:  reports that she has never smoked. She has never used smokeless tobacco. She reports that she does not drink alcohol or use drugs.  Allergies:  Allergies  Allergen Reactions  . Prednisone     Increases blood sugar     (Not in a hospital admission)  Results for orders placed or performed during the hospital encounter of 08/07/18 (from the past 48 hour(s))  Glucose, capillary     Status: Abnormal   Collection Time: 08/07/18  8:56 AM  Result Value Ref Range   Glucose-Capillary 110 (H) 70 - 99 mg/dL  Pregnancy, urine POC     Status: None   Collection Time: 08/07/18  9:01 AM  Result Value Ref Range   Preg Test, Ur NEGATIVE NEGATIVE    Comment:        THE SENSITIVITY OF THIS METHODOLOGY IS >24 mIU/mL    No results found.  Review of Systems  Constitutional: Negative.   HENT: Negative.   Eyes: Negative.   Respiratory: Negative.   Cardiovascular: Negative.    Gastrointestinal: Negative.   Musculoskeletal: Negative.   Skin: Negative.   Neurological: Negative.   Psychiatric/Behavioral: Positive for depression. Negative for hallucinations, memory loss, substance abuse and suicidal ideas. The patient has insomnia. The patient is not nervous/anxious.     Blood pressure (!) 154/83, pulse 85, temperature 98.2 F (36.8 C), temperature source Oral, resp. rate 16, height 5\' 4"  (1.626 m), weight 120.7 kg, SpO2 98 %. Physical Exam  Nursing note and vitals reviewed. Constitutional: She appears well-developed and well-nourished.  HENT:  Head: Normocephalic and atraumatic.  Eyes: Pupils are equal, round, and reactive to light. Conjunctivae are normal.  Neck: Normal range of motion.  Cardiovascular: Regular rhythm and normal heart sounds.  Respiratory: Effort normal. No respiratory distress.  GI: Soft.  Musculoskeletal: Normal range of motion.  Neurological: She is alert.  Skin: Skin is warm and dry.  Psychiatric: Her speech is normal and behavior is normal. Judgment and thought content normal. Her affect is blunt. Cognition and memory are normal. She exhibits a depressed mood.     Assessment/Plan Maintenance ECT continues on acute 2-week schedule next follow-up in 2 weeks after today's treatment.  [name], MD 08/07/2018, 9:11 AM

## 2018-08-07 NOTE — Anesthesia Post-op Follow-up Note (Signed)
Anesthesia QCDR form completed.        

## 2018-08-07 NOTE — Procedures (Signed)
ECT SERVICES Physician's Interval Evaluation & Treatment Note  Patient Identification: Melinda [name] MRN:  [id number] Date of Evaluation:  08/07/2018 TX #: [id number]  MADRS:   MMSE:   P.E. Findings:  No change to physical exam  Psychiatric Interval Note:  Stable  Subjective:  Patient is a 47 y.o. female seen for evaluation for Electroconvulsive Therapy. No specific new complaint  Treatment Summary:   []   Right Unilateral             [x]  Bilateral   % Energy : 1.0 ms 35%   Impedance: 1450 ohms  Seizure Energy Index: 3131 V squared  Postictal Suppression Index: 79%  Seizure Concordance Index: 94%  Medications  Pre Shock: Robinul 0.4 mg labetalol 20 mg esmolol 20 mg Toradol 30 mg Brevital 70 mg succinylcholine 100 mg  Post Shock:    Seizure Duration: 32 seconds by EMG 72 seconds by EEG   Comments: Follow-up 2 weeks  Lungs:  [x]   Clear to auscultation               []  Other:   Heart:    [x]   Regular rhythm             []  irregular rhythm    [x]   Previous H&P reviewed, patient examined and there are NO CHANGES                 []   Previous H&P reviewed, patient examined and there are changes noted.   [name], MD 9/27/20199:12 AM

## 2018-08-07 NOTE — Anesthesia Preprocedure Evaluation (Addendum)
Anesthesia Evaluation  Patient identified by MRN, date of birth, ID band Patient awake    Reviewed: Allergy & Precautions, NPO status , Patient's Chart, lab work & pertinent test results, reviewed documented beta blocker date and time   Airway Mallampati: III  TM Distance: >3 FB     Dental  (+) Teeth Intact   Pulmonary sleep apnea ,           Cardiovascular hypertension, Pt. on medications      Neuro/Psych PSYCHIATRIC DISORDERS Depression Bipolar Disorder  Neuromuscular disease    GI/Hepatic GERD  Controlled,  Endo/Other  diabetes, Type 2, Oral Hypoglycemic Agents  Renal/GU      Musculoskeletal   Abdominal   Peds  Hematology   Anesthesia Other Findings Obese.  Reproductive/Obstetrics                                                           Anesthesia Quick Evaluation  Anesthesia Physical Anesthesia Plan  ASA: III  Anesthesia Plan: General   Post-op Pain Management:    Induction: Intravenous  PONV Risk Score and Plan:   Airway Management Planned:   Additional Equipment:   Intra-op Plan:   Post-operative Plan:   Informed Consent: I have reviewed the patients History and Physical, chart, labs and discussed the procedure including the risks, benefits and alternatives for the proposed anesthesia with the patient or authorized representative who has indicated his/her understanding and acceptance.     Plan Discussed with: CRNA and Anesthesiologist  Anesthesia Plan Comments:        Anesthesia Quick Evaluation

## 2018-08-07 NOTE — Discharge Instructions (Signed)
1)  The drugs that you have been given will stay in your system until tomorrow so for the       next 24 hours you should not:  A. Drive an automobile  B. Make any legal decisions  C. Drink any alcoholic beverages  2)  You may resume your regular meals upon return home.  3)  A responsible adult must take you home.  Someone should stay with you for a few          hours, then be available by phone for the remainder of the treatment day.  4)  You May experience any of the following symptoms:  Headache, Nausea and a dry mouth (due to the medications you were given),  temporary memory loss and some confusion, or sore muscles (a warm bath  should help this).  If you you experience any of these symptoms let us know on                your return visit.  5)  Report any of the following: any acute discomfort, severe headache, or temperature        greater than 100.5 F.   Also report any unusual redness, swelling, drainage, or pain         at your IV site.    You may report Symptoms to:  [place] at [facility]          Phone: [phone], ECT Department           or Dr. Prescott Gum office [phone]  6)  Your next ECT Treatment is Friday October 11  We will call 2 days prior to your scheduled appointment for arrival times.  7)  Nothing to eat or drink after midnight the night before your procedure.  8)  Take morning meds with a sip of water the morning of your procedure.  9)  Other Instructions: Call [phone] to cancel the morning of your procedure due         to illness or emergency.  10) We will call within 72 hours to assess how you are feeling.

## 2018-08-07 NOTE — Transfer of Care (Signed)
Immediate Anesthesia Transfer of Care Note  Patient: Melinda [name]  Procedure(s) Performed: ECT TX  Patient Location: PACU  Anesthesia Type:General  Level of Consciousness: drowsy and patient cooperative  Airway & Oxygen Therapy: Patient Spontanous Breathing  Post-op Assessment: Report given to RN and Post -op Vital signs reviewed and stable  Post vital signs: Reviewed and stable  Last Vitals:  Vitals Value Taken Time  BP 138/84 08/07/2018  9:30 AM  Temp 37.3 C 08/07/2018  9:30 AM  Pulse 94 08/07/2018  9:33 AM  Resp 20 08/07/2018  9:33 AM  SpO2 96 % 08/07/2018  9:33 AM  Vitals shown include unvalidated device data.  Last Pain:  Vitals:   08/07/18 0826  TempSrc: Oral  PainSc: 0-No pain         Complications: No apparent anesthesia complications

## 2018-08-11 NOTE — Anesthesia Postprocedure Evaluation (Signed)
Anesthesia Post Note  Patient: Melinda [name]  Procedure(s) Performed: ECT TX  Patient location during evaluation: PACU Anesthesia Type: General Level of consciousness: awake and alert Pain management: pain level controlled Vital Signs Assessment: post-procedure vital signs reviewed and stable Respiratory status: spontaneous breathing, nonlabored ventilation and respiratory function stable Cardiovascular status: blood pressure returned to baseline and stable Postop Assessment: no apparent nausea or vomiting Anesthetic complications: no     Last Vitals:  Vitals:   08/07/18 1000 08/07/18 1010  BP: 136/80 (!) 138/93  Pulse: 82 83  Resp: (!) 21 16  Temp: 36.6 C 36.6 C  SpO2: 99%     Last Pain:  Vitals:   08/07/18 1010  TempSrc: Oral  PainSc: 0-No pain                 Durenda Hurt

## 2018-08-19 ENCOUNTER — Telehealth: Payer: Self-pay

## 2018-08-21 ENCOUNTER — Other Ambulatory Visit: Payer: Self-pay | Admitting: Psychiatry

## 2018-08-21 ENCOUNTER — Encounter: Payer: Self-pay | Admitting: Certified Registered Nurse Anesthetist

## 2018-08-21 ENCOUNTER — Ambulatory Visit
Admission: RE | Admit: 2018-08-21 | Discharge: 2018-08-21 | Disposition: A | Discharge status: Home / Self Care | Payer: Medicare PPO | Source: Ambulatory Visit | Attending: Psychiatry | Admitting: Psychiatry

## 2018-08-21 ENCOUNTER — Telehealth (HOSPITAL_COMMUNITY): Payer: Self-pay | Admitting: *Deleted

## 2018-08-21 DIAGNOSIS — F329 Major depressive disorder, single episode, unspecified: Secondary | ICD-10-CM | POA: Insufficient documentation

## 2018-08-21 DIAGNOSIS — E1140 Type 2 diabetes mellitus with diabetic neuropathy, unspecified: Secondary | ICD-10-CM | POA: Diagnosis not present

## 2018-08-21 DIAGNOSIS — F609 Personality disorder, unspecified: Secondary | ICD-10-CM | POA: Insufficient documentation

## 2018-08-21 DIAGNOSIS — I10 Essential (primary) hypertension: Secondary | ICD-10-CM | POA: Diagnosis not present

## 2018-08-21 DIAGNOSIS — Z833 Family history of diabetes mellitus: Secondary | ICD-10-CM | POA: Diagnosis not present

## 2018-08-21 DIAGNOSIS — Z888 Allergy status to other drugs, medicaments and biological substances status: Secondary | ICD-10-CM | POA: Diagnosis not present

## 2018-08-21 DIAGNOSIS — K219 Gastro-esophageal reflux disease without esophagitis: Secondary | ICD-10-CM | POA: Diagnosis not present

## 2018-08-21 DIAGNOSIS — E7800 Pure hypercholesterolemia, unspecified: Secondary | ICD-10-CM | POA: Diagnosis not present

## 2018-08-21 DIAGNOSIS — F332 Major depressive disorder, recurrent severe without psychotic features: Secondary | ICD-10-CM | POA: Diagnosis not present

## 2018-08-21 DIAGNOSIS — E1142 Type 2 diabetes mellitus with diabetic polyneuropathy: Secondary | ICD-10-CM | POA: Diagnosis not present

## 2018-08-21 DIAGNOSIS — Z8249 Family history of ischemic heart disease and other diseases of the circulatory system: Secondary | ICD-10-CM | POA: Diagnosis not present

## 2018-08-21 DIAGNOSIS — G4730 Sleep apnea, unspecified: Secondary | ICD-10-CM | POA: Diagnosis not present

## 2018-08-21 LAB — GLUCOSE, CAPILLARY: Glucose-Capillary: 143 mg/dL — ABNORMAL HIGH (ref 70–99)

## 2018-08-21 MED ORDER — METHOHEXITAL SODIUM 0.5 G IJ SOLR
INTRAMUSCULAR | Status: AC
  Filled 2018-08-21: qty 500

## 2018-08-21 MED ORDER — ESMOLOL HCL 100 MG/10ML IV SOLN
INTRAVENOUS | Status: DC | PRN
  Administered 2018-08-21: 20 mg via INTRAVENOUS

## 2018-08-21 MED ORDER — METHOHEXITAL SODIUM 100 MG/10ML IV SOSY
PREFILLED_SYRINGE | INTRAVENOUS | Status: DC | PRN
  Administered 2018-08-21: 70 mg via INTRAVENOUS

## 2018-08-21 MED ORDER — ESMOLOL HCL 100 MG/10ML IV SOLN
INTRAVENOUS | Status: AC
  Filled 2018-08-21: qty 10

## 2018-08-21 MED ORDER — SODIUM CHLORIDE 0.9 % IV SOLN
500.0000 mL | Freq: Once | INTRAVENOUS | Status: AC
  Administered 2018-08-21: 500 mL via INTRAVENOUS

## 2018-08-21 MED ORDER — KETOROLAC TROMETHAMINE 30 MG/ML IJ SOLN
INTRAMUSCULAR | Status: AC
  Administered 2018-08-21: 30 mg via INTRAVENOUS
  Filled 2018-08-21: qty 1

## 2018-08-21 MED ORDER — GLYCOPYRROLATE 0.2 MG/ML IJ SOLN
INTRAMUSCULAR | Status: AC
  Administered 2018-08-21: 0.4 mg via INTRAVENOUS
  Filled 2018-08-21: qty 2

## 2018-08-21 MED ORDER — LABETALOL HCL 5 MG/ML IV SOLN
INTRAVENOUS | Status: DC | PRN
  Administered 2018-08-21: 20 mg via INTRAVENOUS

## 2018-08-21 MED ORDER — GLYCOPYRROLATE 0.2 MG/ML IJ SOLN
0.4000 mg | Freq: Once | INTRAMUSCULAR | Status: AC
  Administered 2018-08-21: 0.4 mg via INTRAVENOUS

## 2018-08-21 MED ORDER — SODIUM CHLORIDE 0.9 % IV SOLN
INTRAVENOUS | Status: DC | PRN
  Administered 2018-08-21: 11:00:00 via INTRAVENOUS

## 2018-08-21 MED ORDER — KETOROLAC TROMETHAMINE 30 MG/ML IJ SOLN
30.0000 mg | Freq: Once | INTRAMUSCULAR | Status: AC
  Administered 2018-08-21: 30 mg via INTRAVENOUS

## 2018-08-21 MED ORDER — SUCCINYLCHOLINE CHLORIDE 20 MG/ML IJ SOLN
INTRAMUSCULAR | Status: DC | PRN
  Administered 2018-08-21: 100 mg via INTRAVENOUS

## 2018-08-21 MED ORDER — SUCCINYLCHOLINE CHLORIDE 20 MG/ML IJ SOLN
INTRAMUSCULAR | Status: AC
  Filled 2018-08-21: qty 1

## 2018-08-21 MED ORDER — LABETALOL HCL 5 MG/ML IV SOLN
INTRAVENOUS | Status: AC
  Filled 2018-08-21: qty 4

## 2018-08-21 NOTE — Anesthesia Postprocedure Evaluation (Signed)
Anesthesia Post Note  Patient: Ninamarie M Freiermuth  Procedure(s) Performed: ECT TX  Patient location during evaluation: PACU Anesthesia Type: General Level of consciousness: awake and alert and oriented Pain management: pain level controlled Vital Signs Assessment: post-procedure vital signs reviewed and stable Respiratory status: spontaneous breathing, nonlabored ventilation and respiratory function stable Cardiovascular status: blood pressure returned to baseline and stable Postop Assessment: no signs of nausea or vomiting Anesthetic complications: no     Last Vitals:  Vitals:   08/21/18 1137 08/21/18 1146  BP: 126/80 131/82  Pulse: 92 87  Resp: (!) 22 16  Temp: 36.6 C 36.6 C  SpO2: 97%     Last Pain:  Vitals:   08/21/18 1146  TempSrc: Oral  PainSc: 0-No pain                 Kaysen Deal

## 2018-08-21 NOTE — Discharge Instructions (Signed)
1)  The drugs that you have been given will stay in your system until tomorrow so for the       next 24 hours you should not:  A. Drive an automobile  B. Make any legal decisions  C. Drink any alcoholic beverages  2)  You may resume your regular meals upon return home.  3)  A responsible adult must take you home.  Someone should stay with you for a few          hours, then be available by phone for the remainder of the treatment day.  4)  You May experience any of the following symptoms:  Headache, Nausea and a dry mouth (due to the medications you were given),  temporary memory loss and some confusion, or sore muscles (a warm bath  should help this).  If you you experience any of these symptoms let us know on                your return visit.  5)  Report any of the following: any acute discomfort, severe headache, or temperature        greater than 100.5 F.   Also report any unusual redness, swelling, drainage, or pain         at your IV site.    You may report Symptoms to:  [place] at [facility]          Phone: [phone], ECT Department           or Dr. Prescott Gum office [phone]  6)  Your next ECT Treatment is Friday October  25  We will call 2 days prior to your scheduled appointment for arrival times.  7)  Nothing to eat or drink after midnight the night before your procedure.  8)  Take      With a sip of water the morning of your procedure.  9)  Other Instructions: Call [phone] to cancel the morning of your procedure due         to illness or emergency.  10) We will call within 72 hours to assess how you are feeling.

## 2018-08-21 NOTE — Procedures (Signed)
ECT SERVICES Physician's Interval Evaluation & Treatment Note  Patient Identification: Melinda [name] MRN:  [id number] Date of Evaluation:  08/21/2018 TX #: [id number]  MADRS:   MMSE:   P.E. Findings:  No change  Psychiatric Interval Note:  Chronic dysphoria  Subjective:  Patient is a 47 y.o. female seen for evaluation for Electroconvulsive Therapy. Stable no physical complaints no suicidal ideation no psychosis  Treatment Summary:   []   Right Unilateral             [x]  Bilateral   % Energy : 1.0 ms 35%   Impedance: 1070 ohms  Seizure Energy Index: 8029 V squared  Postictal Suppression Index: 79%  Seizure Concordance Index: 93%  Medications  Pre Shock: Robinul 0.4 mg Toradol 30 mg labetalol 20 mg esmolol 20 mg Brevital 70 mg succinylcholine 100 mg  Post Shock:    Seizure Duration: 41 seconds EMG 76 seconds EEG   Comments: Follow-up 2 weeks  Lungs:  [x]   Clear to auscultation               []  Other:   Heart:    [x]   Regular rhythm             []  irregular rhythm    [x]   Previous H&P reviewed, patient examined and there are NO CHANGES                 []   Previous H&P reviewed, patient examined and there are changes noted.   [name], MD 10/11/201911:01 AM

## 2018-08-21 NOTE — Anesthesia Preprocedure Evaluation (Signed)
Anesthesia Evaluation  Patient identified by MRN, date of birth, ID band Patient awake    Reviewed: Allergy & Precautions, H&P , NPO status , Patient's Chart, lab work & pertinent test results  History of Anesthesia Complications Negative for: history of anesthetic complications  Airway Mallampati: II  TM Distance: >3 FB Neck ROM: full    Dental  (+) Poor Dentition, Chipped   Pulmonary sleep apnea , neg COPD,    Pulmonary exam normal breath sounds clear to auscultation       Cardiovascular hypertension, Pt. on medications (-) CAD and (-) Past MI negative cardio ROS Normal cardiovascular exam Rhythm:regular Rate:Normal     Neuro/Psych PSYCHIATRIC DISORDERS Depression Bipolar Disorder  Neuromuscular disease negative neurological ROS     GI/Hepatic negative GI ROS, Neg liver ROS, GERD  Medicated,  Endo/Other  diabetes, Type 2, Oral Hypoglycemic Agents  Renal/GU negative Renal ROS  negative genitourinary   Musculoskeletal   Abdominal (+) + obese,   Peds  Hematology negative hematology ROS (+)   Anesthesia Other Findings Past Medical History: No date: Depression No date: Diabetes mellitus without complication ([facility]) 03/07/15: Diabetic peripheral neuropathy ([facility]) 03/07/15: Diabetic peripheral neuropathy ([facility]) 03/07/15: Diabetic peripheral neuropathy ([facility]) No date: GERD (gastroesophageal reflux disease) 03/07/15: Hypercholesterolemia No date: Hypertension 03/07/15: Obesity 03/07/15: Personality disorder 03/07/15: Sinus tachycardia ([facility])     Comment: history of 03/07/15: Suicidal thoughts Past Surgical History: 03/07/15: electroconvulsion therapy BMI    Body Mass Index:  36.44 kg/m     Reproductive/Obstetrics                             Anesthesia Physical  Anesthesia Plan  ASA: III  Anesthesia Plan: General   Post-op Pain Management:    Induction: Intravenous  PONV Risk Score and  Plan: 2 and Ondansetron  Airway Management Planned: Mask  Additional Equipment:   Intra-op Plan:   Post-operative Plan:   Informed Consent: I have reviewed the patients History and Physical, chart, labs and discussed the procedure including the risks, benefits and alternatives for the proposed anesthesia with the patient or authorized representative who has indicated his/her understanding and acceptance.     Dental Advisory Given  Plan Discussed with: CRNA and Anesthesiologist  Anesthesia Plan Comments:         Anesthesia Quick Evaluation  

## 2018-08-21 NOTE — Anesthesia Procedure Notes (Signed)
Performed by: Fleetwood Pierron, CRNA Pre-anesthesia Checklist: Patient identified, Patient being monitored, Timeout performed, Emergency Drugs available and Suction available Patient Re-evaluated:Patient Re-evaluated prior to induction Oxygen Delivery Method: Circle system utilized Preoxygenation: Pre-oxygenation with 100% oxygen Ventilation: Mask ventilation without difficulty Airway Equipment and Method: Bite block Dental Injury: Teeth and Oropharynx as per pre-operative assessment        

## 2018-08-21 NOTE — Anesthesia Post-op Follow-up Note (Signed)
Anesthesia QCDR form completed.        

## 2018-08-21 NOTE — H&P (Signed)
Melinda [name] is an 47 y.o. female.   Chief Complaint: Chronic dysphoria HPI: Long-standing depression stability assisted by ECT  Past Medical History:  Diagnosis Date  . Depression   . Diabetes mellitus without complication ([place])   . Diabetic peripheral neuropathy ([place]) 03/07/15  . Diabetic peripheral neuropathy ([place]) 03/07/15  . Diabetic peripheral neuropathy ([place]) 03/07/15  . GERD (gastroesophageal reflux disease)   . Hypercholesterolemia 03/07/15  . Hypertension   . Obesity 03/07/15  . Personality disorder ([place]) 03/07/15  . Sinus tachycardia 03/07/15   history of  . Suicidal thoughts 03/07/15    Past Surgical History:  Procedure Laterality Date  . electroconvulsion therapy  03/07/15    Family History  Problem Relation Age of Onset  . Hypertension Father   . Diabetes Mother    Social History:  reports that she has never smoked. She has never used smokeless tobacco. She reports that she does not drink alcohol or use drugs.  Allergies:  Allergies  Allergen Reactions  . Prednisone     Increases blood sugar     (Not in a hospital admission)  Results for orders placed or performed during the hospital encounter of 08/21/18 (from the past 48 hour(s))  Glucose, capillary     Status: Abnormal   Collection Time: 08/21/18  9:25 AM  Result Value Ref Range   Glucose-Capillary 143 (H) 70 - 99 mg/dL   No results found.  Review of Systems  Constitutional: Negative.   HENT: Negative.   Eyes: Negative.   Respiratory: Negative.   Cardiovascular: Negative.   Gastrointestinal: Negative.   Musculoskeletal: Negative.   Skin: Negative.   Neurological: Negative.   Psychiatric/Behavioral: Positive for depression. Negative for hallucinations, memory loss, substance abuse and suicidal ideas. The patient is not nervous/anxious and does not have insomnia.     Blood pressure 137/74, pulse 98, temperature (!) 97.1 F (36.2 C), temperature source Oral, resp. rate 16, height 5\' 4"  (1.626  m), weight 119.3 kg, SpO2 98 %. Physical Exam  Nursing note and vitals reviewed. Constitutional: She appears well-developed and well-nourished.  HENT:  Head: Normocephalic and atraumatic.  Eyes: Pupils are equal, round, and reactive to light. Conjunctivae are normal.  Neck: Normal range of motion.  Cardiovascular: Regular rhythm and normal heart sounds.  Respiratory: Effort normal.  GI: Soft.  Musculoskeletal: Normal range of motion.  Neurological: She is alert.  Skin: Skin is warm and dry.     Assessment/Plan Continue 2-week schedule  [name], MD 08/21/2018, 10:51 AM

## 2018-08-21 NOTE — Telephone Encounter (Signed)
Called for concurrent review of ECT was told by [facility] that no authorization is now required as of August 2019. Given reference [id number].

## 2018-08-21 NOTE — Transfer of Care (Signed)
Immediate Anesthesia Transfer of Care Note  Patient: Melinda [name]  Procedure(s) Performed: ECT TX  Patient Location: PACU  Anesthesia Type:General  Level of Consciousness: drowsy  Airway & Oxygen Therapy: Patient Spontanous Breathing and Patient connected to face mask oxygen  Post-op Assessment: Report given to RN and Post -op Vital signs reviewed and stable  Post vital signs: Reviewed and stable  Last Vitals:  Vitals Value Taken Time  BP 159/86 08/21/2018 11:08 AM  Temp 37.1 C 08/21/2018 11:08 AM  Pulse 98 08/21/2018 11:09 AM  Resp 17 08/21/2018 11:09 AM  SpO2 100 % 08/21/2018 11:09 AM  Vitals shown include unvalidated device data.  Last Pain:  Vitals:   08/21/18 1108  TempSrc:   PainSc: Asleep         Complications: No apparent anesthesia complications

## 2018-08-26 ENCOUNTER — Other Ambulatory Visit: Payer: Self-pay | Admitting: Psychiatry

## 2018-09-02 ENCOUNTER — Telehealth: Payer: Self-pay

## 2018-09-04 ENCOUNTER — Encounter: Payer: Self-pay | Admitting: Anesthesiology

## 2018-09-04 ENCOUNTER — Other Ambulatory Visit: Payer: Self-pay | Admitting: Psychiatry

## 2018-09-04 ENCOUNTER — Encounter
Admission: RE | Admit: 2018-09-04 | Discharge: 2018-09-04 | Disposition: A | Discharge status: Home / Self Care | Payer: Medicare PPO | Source: Ambulatory Visit | Attending: Psychiatry | Admitting: Psychiatry

## 2018-09-04 DIAGNOSIS — F332 Major depressive disorder, recurrent severe without psychotic features: Secondary | ICD-10-CM | POA: Diagnosis not present

## 2018-09-04 DIAGNOSIS — G4730 Sleep apnea, unspecified: Secondary | ICD-10-CM | POA: Diagnosis not present

## 2018-09-04 DIAGNOSIS — Z888 Allergy status to other drugs, medicaments and biological substances status: Secondary | ICD-10-CM | POA: Insufficient documentation

## 2018-09-04 DIAGNOSIS — E1140 Type 2 diabetes mellitus with diabetic neuropathy, unspecified: Secondary | ICD-10-CM | POA: Diagnosis not present

## 2018-09-04 DIAGNOSIS — K219 Gastro-esophageal reflux disease without esophagitis: Secondary | ICD-10-CM | POA: Diagnosis not present

## 2018-09-04 DIAGNOSIS — E7800 Pure hypercholesterolemia, unspecified: Secondary | ICD-10-CM | POA: Diagnosis not present

## 2018-09-04 DIAGNOSIS — E1142 Type 2 diabetes mellitus with diabetic polyneuropathy: Secondary | ICD-10-CM | POA: Insufficient documentation

## 2018-09-04 DIAGNOSIS — I10 Essential (primary) hypertension: Secondary | ICD-10-CM | POA: Insufficient documentation

## 2018-09-04 DIAGNOSIS — F329 Major depressive disorder, single episode, unspecified: Secondary | ICD-10-CM | POA: Insufficient documentation

## 2018-09-04 MED ORDER — METHOHEXITAL SODIUM 0.5 G IJ SOLR
INTRAMUSCULAR | Status: AC
  Filled 2018-09-04: qty 500

## 2018-09-04 MED ORDER — LABETALOL HCL 5 MG/ML IV SOLN
INTRAVENOUS | Status: DC | PRN
  Administered 2018-09-04: 20 mg via INTRAVENOUS

## 2018-09-04 MED ORDER — ESMOLOL HCL 100 MG/10ML IV SOLN
INTRAVENOUS | Status: DC | PRN
  Administered 2018-09-04: 20 mg via INTRAVENOUS

## 2018-09-04 MED ORDER — GLYCOPYRROLATE 0.2 MG/ML IJ SOLN
0.4000 mg | Freq: Once | INTRAMUSCULAR | Status: AC
  Administered 2018-09-04: 0.2 mg via INTRAVENOUS

## 2018-09-04 MED ORDER — GLYCOPYRROLATE 0.2 MG/ML IJ SOLN
INTRAMUSCULAR | Status: AC
  Filled 2018-09-04: qty 1

## 2018-09-04 MED ORDER — KETOROLAC TROMETHAMINE 30 MG/ML IJ SOLN
INTRAMUSCULAR | Status: AC
  Administered 2018-09-04: 30 mg via INTRAVENOUS
  Filled 2018-09-04: qty 1

## 2018-09-04 MED ORDER — SODIUM CHLORIDE 0.9 % IV SOLN
INTRAVENOUS | Status: DC | PRN
  Administered 2018-09-04: 11:00:00 via INTRAVENOUS

## 2018-09-04 MED ORDER — KETOROLAC TROMETHAMINE 30 MG/ML IJ SOLN
30.0000 mg | Freq: Once | INTRAMUSCULAR | Status: AC
  Administered 2018-09-04: 30 mg via INTRAVENOUS

## 2018-09-04 MED ORDER — PROPOFOL 10 MG/ML IV BOLUS
INTRAVENOUS | Status: AC
  Filled 2018-09-04: qty 20

## 2018-09-04 MED ORDER — METHOHEXITAL SODIUM 100 MG/10ML IV SOSY
PREFILLED_SYRINGE | INTRAVENOUS | Status: DC | PRN
  Administered 2018-09-04: 70 mg via INTRAVENOUS

## 2018-09-04 MED ORDER — GLYCOPYRROLATE 0.2 MG/ML IJ SOLN
INTRAMUSCULAR | Status: AC
  Administered 2018-09-04: 0.2 mg via INTRAVENOUS
  Filled 2018-09-04: qty 1

## 2018-09-04 MED ORDER — SODIUM CHLORIDE 0.9 % IV SOLN
500.0000 mL | Freq: Once | INTRAVENOUS | Status: AC
  Administered 2018-09-04: 500 mL via INTRAVENOUS

## 2018-09-04 NOTE — Procedures (Signed)
ECT SERVICES Physician's Interval Evaluation & Treatment Note  Patient Identification: Melinda [name] MRN:  [id number] Date of Evaluation:  09/04/2018 TX #: [id number]  MADRS:   MMSE:   P.E. Findings:  No change to physical  Psychiatric Interval Note:  Chronic depression  Subjective:  Patient is a 47 y.o. female seen for evaluation for Electroconvulsive Therapy. Tonic depression  Treatment Summary:   []   Right Unilateral             [x]  Bilateral   % Energy : 1.0 ms 35%   Impedance: 1360 ohms  Seizure Energy Index: No good reading  Postictal Suppression Index: Computer did not read but there was actually pretty good normal suppression for her  Seizure Concordance Index: 97%  Medications  Pre Shock: Robinul 0.4 mg Toradol 30 mg labetalol 20 mg esmolol 20 mg Brevital 70 mg succinylcholine 100 mg  Post Shock:    Seizure Duration: 36 seconds by EMG 76 seconds by EEG   Comments: Follow-up 2 weeks  Lungs:  [x]   Clear to auscultation               []  Other:   Heart:    [x]   Regular rhythm             []  irregular rhythm    [x]   Previous H&P reviewed, patient examined and there are NO CHANGES                 []   Previous H&P reviewed, patient examined and there are changes noted.   [name], MD 10/25/201910:36 AM

## 2018-09-04 NOTE — Anesthesia Postprocedure Evaluation (Signed)
Anesthesia Post Note  Patient: Melinda [name]  Procedure(s) Performed: ECT TX  Patient location during evaluation: PACU Anesthesia Type: General Level of consciousness: awake and alert Pain management: pain level controlled Vital Signs Assessment: post-procedure vital signs reviewed and stable Respiratory status: spontaneous breathing, nonlabored ventilation, respiratory function stable and patient connected to nasal cannula oxygen Cardiovascular status: blood pressure returned to baseline and stable Postop Assessment: no apparent nausea or vomiting Anesthetic complications: no     Last Vitals:  Vitals:   09/04/18 0900  BP: (!) 153/85  Pulse: (!) 101  Resp: 14  Temp: (!) 36.4 C  SpO2: 94%    Last Pain: There were no vitals filed for this visit.               Molli Barrows

## 2018-09-04 NOTE — Anesthesia Preprocedure Evaluation (Signed)
Anesthesia Evaluation  Patient identified by MRN, date of birth, ID band Patient awake    Reviewed: Allergy & Precautions, H&P , NPO status , Patient's Chart, lab work & pertinent test results, reviewed documented beta blocker date and time   Airway Mallampati: II   Neck ROM: full    Dental  (+) Poor Dentition, Teeth Intact   Pulmonary sleep apnea and Continuous Positive Airway Pressure Ventilation ,    Pulmonary exam normal        Cardiovascular Exercise Tolerance: Poor hypertension, On Medications negative cardio ROS Normal cardiovascular exam Rhythm:regular Rate:Normal     Neuro/Psych PSYCHIATRIC DISORDERS Depression Bipolar Disorder  Neuromuscular disease    GI/Hepatic Neg liver ROS, GERD  Medicated,  Endo/Other  negative endocrine ROSdiabetes, Well Controlled, Type 2, Oral Hypoglycemic Agents  Renal/GU negative Renal ROS  negative genitourinary   Musculoskeletal   Abdominal   Peds  Hematology negative hematology ROS (+)   Anesthesia Other Findings Past Medical History: No date: Depression No date: Diabetes mellitus without complication ([place]) 1/69/67: Diabetic peripheral neuropathy ([place]) 03/07/15: Diabetic peripheral neuropathy ([place]) 03/07/15: Diabetic peripheral neuropathy ([facility]) No date: GERD (gastroesophageal reflux disease) 03/07/15: Hypercholesterolemia No date: Hypertension 03/07/15: Obesity 03/07/15: Personality disorder ([place]) 03/07/15: Sinus tachycardia     Comment:  history of 03/07/15: Suicidal thoughts Past Surgical History: 03/07/15: electroconvulsion therapy BMI    Body Mass Index:  44.97 kg/m     Reproductive/Obstetrics negative OB ROS                             Anesthesia Physical Anesthesia Plan  ASA: III  Anesthesia Plan: General   Post-op Pain Management:    Induction:   PONV Risk Score and Plan:   Airway Management Planned:   Additional Equipment:    Intra-op Plan:   Post-operative Plan:   Informed Consent: I have reviewed the patients History and Physical, chart, labs and discussed the procedure including the risks, benefits and alternatives for the proposed anesthesia with the patient or authorized representative who has indicated his/her understanding and acceptance.   Dental Advisory Given  Plan Discussed with: CRNA  Anesthesia Plan Comments:         Anesthesia Quick Evaluation

## 2018-09-04 NOTE — Discharge Instructions (Signed)
1)  The drugs that you have been given will stay in your system until tomorrow so for the       next 24 hours you should not:  A. Drive an automobile  B. Make any legal decisions  C. Drink any alcoholic beverages  2)  You may resume your regular meals upon return home.  3)  A responsible adult must take you home.  Someone should stay with you for a few          hours, then be available by phone for the remainder of the treatment day.  4)  You May experience any of the following symptoms:  Headache, Nausea and a dry mouth (due to the medications you were given),  temporary memory loss and some confusion, or sore muscles (a warm bath  should help this).  If you you experience any of these symptoms let us know on                your return visit.  5)  Report any of the following: any acute discomfort, severe headache, or temperature        greater than 100.5 F.   Also report any unusual redness, swelling, drainage, or pain         at your IV site.    You may report Symptoms to:  [place] at [facility]          Phone: [phone], ECT Department           or Dr. Prescott Gum office [phone]  6)  Your next ECT Treatment is Friday November 8   We will call 2 days prior to your scheduled appointment for arrival times.  7)  Nothing to eat or drink after midnight the night before your procedure.  8)  Take    With a sip of water the morning of your procedure.  9)  Other Instructions: Call [phone] to cancel the morning of your procedure due         to illness or emergency.  10) We will call within 72 hours to assess how you are feeling.

## 2018-09-04 NOTE — H&P (Signed)
Melinda [name] is an 47 y.o. female.   Chief Complaint: Chronic depression HPI: Chronic depression many of the same stresses as usual.  Resists self injury for the most part  Past Medical History:  Diagnosis Date  . Depression   . Diabetes mellitus without complication ([place])   . Diabetic peripheral neuropathy ([place]) 03/07/15  . Diabetic peripheral neuropathy ([place]) 03/07/15  . Diabetic peripheral neuropathy ([place]) 03/07/15  . GERD (gastroesophageal reflux disease)   . Hypercholesterolemia 03/07/15  . Hypertension   . Obesity 03/07/15  . Personality disorder ([place]) 03/07/15  . Sinus tachycardia 03/07/15   history of  . Suicidal thoughts 03/07/15    Past Surgical History:  Procedure Laterality Date  . electroconvulsion therapy  03/07/15    Family History  Problem Relation Age of Onset  . Hypertension Father   . Diabetes Mother    Social History:  reports that she has never smoked. She has never used smokeless tobacco. She reports that she does not drink alcohol or use drugs.  Allergies:  Allergies  Allergen Reactions  . Prednisone     Increases blood sugar     (Not in a hospital admission)  No results found for this or any previous visit (from the past 48 hour(s)). No results found.  Review of Systems  Constitutional: Negative.   HENT: Negative.   Eyes: Negative.   Respiratory: Negative.   Cardiovascular: Negative.   Gastrointestinal: Negative.   Musculoskeletal: Negative.   Skin: Negative.   Neurological: Negative.   Psychiatric/Behavioral: Positive for depression. Negative for hallucinations, memory loss, substance abuse and suicidal ideas. The patient is not nervous/anxious and does not have insomnia.     Blood pressure (!) 153/85, pulse (!) 101, temperature (!) 97.5 F (36.4 C), resp. rate 14, height 5\' 4"  (1.626 m), weight 118.8 kg, SpO2 94 %. Physical Exam  Nursing note and vitals reviewed. Constitutional: She appears well-developed and well-nourished.  HENT:   Head: Normocephalic and atraumatic.  Eyes: Pupils are equal, round, and reactive to light. Conjunctivae are normal.  Neck: Normal range of motion.  Cardiovascular: Regular rhythm and normal heart sounds.  Respiratory: Effort normal. No respiratory distress.  GI: Soft.  Musculoskeletal: Normal range of motion.  Neurological: She is alert.  Skin: Skin is warm and dry.  Psychiatric: Her speech is normal and behavior is normal. Judgment and thought content normal. Her affect is blunt. She exhibits a depressed mood.     Assessment/Plan ECT today and follow-up on 2-week schedule  [name], MD 09/04/2018, 10:35 AM

## 2018-09-04 NOTE — Anesthesia Post-op Follow-up Note (Signed)
Anesthesia QCDR form completed.        

## 2018-09-04 NOTE — Transfer of Care (Signed)
Immediate Anesthesia Transfer of Care Note  Patient: Melinda [name]  Procedure(s) Performed: ECT TX  Patient Location: PACU  Anesthesia Type:General  Level of Consciousness: awake, alert  and oriented  Airway & Oxygen Therapy: Patient Spontanous Breathing and Patient connected to face mask oxygen  Post-op Assessment: Report given to RN and Post -op Vital signs reviewed and stable  Post vital signs: Reviewed and stable  Last Vitals:  Vitals Value Taken Time  BP    Temp    Pulse    Resp    SpO2      Last Pain: There were no vitals filed for this visit.    Patients Stated Pain Goal: 0 (63/33/54 5625)  Complications: No apparent anesthesia complications

## 2018-09-16 ENCOUNTER — Telehealth: Payer: Self-pay

## 2018-09-17 ENCOUNTER — Other Ambulatory Visit: Payer: Self-pay | Admitting: Psychiatry

## 2018-09-18 ENCOUNTER — Encounter: Payer: Self-pay | Admitting: Anesthesiology

## 2018-09-18 ENCOUNTER — Encounter
Admission: RE | Admit: 2018-09-18 | Discharge: 2018-09-18 | Disposition: A | Discharge status: Home / Self Care | Payer: Medicare PPO | Source: Ambulatory Visit | Attending: Psychiatry | Admitting: Psychiatry

## 2018-09-18 DIAGNOSIS — I10 Essential (primary) hypertension: Secondary | ICD-10-CM | POA: Diagnosis not present

## 2018-09-18 DIAGNOSIS — F319 Bipolar disorder, unspecified: Secondary | ICD-10-CM | POA: Diagnosis not present

## 2018-09-18 DIAGNOSIS — F332 Major depressive disorder, recurrent severe without psychotic features: Secondary | ICD-10-CM

## 2018-09-18 DIAGNOSIS — E1142 Type 2 diabetes mellitus with diabetic polyneuropathy: Secondary | ICD-10-CM | POA: Insufficient documentation

## 2018-09-18 DIAGNOSIS — Z3202 Encounter for pregnancy test, result negative: Secondary | ICD-10-CM | POA: Insufficient documentation

## 2018-09-18 DIAGNOSIS — K219 Gastro-esophageal reflux disease without esophagitis: Secondary | ICD-10-CM | POA: Insufficient documentation

## 2018-09-18 DIAGNOSIS — F339 Major depressive disorder, recurrent, unspecified: Secondary | ICD-10-CM | POA: Insufficient documentation

## 2018-09-18 DIAGNOSIS — Z888 Allergy status to other drugs, medicaments and biological substances status: Secondary | ICD-10-CM | POA: Insufficient documentation

## 2018-09-18 DIAGNOSIS — F322 Major depressive disorder, single episode, severe without psychotic features: Secondary | ICD-10-CM | POA: Diagnosis not present

## 2018-09-18 LAB — POCT PREGNANCY, URINE: Preg Test, Ur: NEGATIVE

## 2018-09-18 LAB — GLUCOSE, CAPILLARY: Glucose-Capillary: 144 mg/dL — ABNORMAL HIGH (ref 70–99)

## 2018-09-18 MED ORDER — ONDANSETRON HCL 4 MG/2ML IJ SOLN: 4.0000 mg | Freq: Once | INTRAMUSCULAR | Status: DC | PRN

## 2018-09-18 MED ORDER — GLYCOPYRROLATE 0.2 MG/ML IJ SOLN
0.4000 mg | Freq: Once | INTRAMUSCULAR | Status: AC
  Administered 2018-09-18: 0.4 mg via INTRAVENOUS

## 2018-09-18 MED ORDER — GLYCOPYRROLATE 0.2 MG/ML IJ SOLN
INTRAMUSCULAR | Status: AC
  Administered 2018-09-18: 0.4 mg via INTRAVENOUS
  Filled 2018-09-18: qty 2

## 2018-09-18 MED ORDER — FENTANYL CITRATE (PF) 100 MCG/2ML IJ SOLN: 25.0000 ug | INTRAMUSCULAR | Status: DC | PRN

## 2018-09-18 MED ORDER — SUCCINYLCHOLINE CHLORIDE 20 MG/ML IJ SOLN
INTRAMUSCULAR | Status: DC | PRN
  Administered 2018-09-18: 100 mg via INTRAVENOUS

## 2018-09-18 MED ORDER — SODIUM CHLORIDE 0.9 % IV SOLN
500.0000 mL | Freq: Once | INTRAVENOUS | Status: AC
  Administered 2018-09-18: 500 mL via INTRAVENOUS

## 2018-09-18 MED ORDER — METHOHEXITAL SODIUM 100 MG/10ML IV SOSY
PREFILLED_SYRINGE | INTRAVENOUS | Status: DC | PRN
  Administered 2018-09-18: 70 mg via INTRAVENOUS

## 2018-09-18 MED ORDER — KETOROLAC TROMETHAMINE 30 MG/ML IJ SOLN
30.0000 mg | Freq: Once | INTRAMUSCULAR | Status: AC
  Administered 2018-09-18: 30 mg via INTRAVENOUS

## 2018-09-18 MED ORDER — ESMOLOL HCL 100 MG/10ML IV SOLN
INTRAVENOUS | Status: DC | PRN
  Administered 2018-09-18: 20 mg via INTRAVENOUS

## 2018-09-18 MED ORDER — KETOROLAC TROMETHAMINE 30 MG/ML IJ SOLN
INTRAMUSCULAR | Status: AC
  Administered 2018-09-18: 30 mg via INTRAVENOUS
  Filled 2018-09-18: qty 1

## 2018-09-18 MED ORDER — LABETALOL HCL 5 MG/ML IV SOLN
INTRAVENOUS | Status: DC | PRN
  Administered 2018-09-18: 20 mg via INTRAVENOUS

## 2018-09-18 NOTE — Discharge Instructions (Signed)
1)  The drugs that you have been given will stay in your system until tomorrow so for the       next 24 hours you should not:  A. Drive an automobile  B. Make any legal decisions  C. Drink any alcoholic beverages  2)  You may resume your regular meals upon return home.  3)  A responsible adult must take you home.  Someone should stay with you for a few          hours, then be available by phone for the remainder of the treatment day.  4)  You May experience any of the following symptoms:  Headache, Nausea and a dry mouth (due to the medications you were given),  temporary memory loss and some confusion, or sore muscles (a warm bath  should help this).  If you you experience any of these symptoms let us know on                your return visit.  5)  Report any of the following: any acute discomfort, severe headache, or temperature        greater than 100.5 F.   Also report any unusual redness, swelling, drainage, or pain         at your IV site.    You may report Symptoms to:  [place] [facility]          Phone: [phone], ECT Department           or Dr. Prescott Gum office [phone]  6)  Your next ECT Treatment is Friday November 22 at 0830 (this is so we can prep you before the treatment)   We will call 2 days prior to your scheduled appointment for arrival times.  7)  Nothing to eat or drink after midnight the night before your procedure.  8)  Take     With a sip of water the morning of your procedure.  9)  Other Instructions: Call [phone] to cancel the morning of your procedure due         to illness or emergency.  10) We will call within 72 hours to assess how you are feeling.

## 2018-09-18 NOTE — Anesthesia Post-op Follow-up Note (Signed)
Anesthesia QCDR form completed.        

## 2018-09-18 NOTE — Anesthesia Preprocedure Evaluation (Signed)
Anesthesia Evaluation  Patient identified by MRN, date of birth, ID band Patient awake    Reviewed: Allergy & Precautions, NPO status , Patient's Chart, lab work & pertinent test results, reviewed documented beta blocker date and time   Airway Mallampati: III  TM Distance: >3 FB     Dental  (+) Chipped   Pulmonary sleep apnea ,           Cardiovascular hypertension,      Neuro/Psych PSYCHIATRIC DISORDERS Depression Bipolar Disorder  Neuromuscular disease    GI/Hepatic GERD  Controlled,  Endo/Other  diabetes, Type 2Morbid obesity  Renal/GU      Musculoskeletal   Abdominal   Peds  Hematology   Anesthesia Other Findings   Reproductive/Obstetrics                             Anesthesia Physical Anesthesia Plan  ASA: III  Anesthesia Plan: General   Post-op Pain Management:    Induction: Intravenous  PONV Risk Score and Plan:   Airway Management Planned:   Additional Equipment:   Intra-op Plan:   Post-operative Plan:   Informed Consent: I have reviewed the patients History and Physical, chart, labs and discussed the procedure including the risks, benefits and alternatives for the proposed anesthesia with the patient or authorized representative who has indicated his/her understanding and acceptance.     Plan Discussed with: CRNA  Anesthesia Plan Comments:         Anesthesia Quick Evaluation

## 2018-09-18 NOTE — Transfer of Care (Signed)
Immediate Anesthesia Transfer of Care Note  Patient: Melinda [name]  Procedure(s) Performed: ECT TX  Patient Location: PACU  Anesthesia Type:General  Level of Consciousness: awake  Airway & Oxygen Therapy: Patient Spontanous Breathing and Patient connected to face mask oxygen  Post-op Assessment: Report given to RN and Post -op Vital signs reviewed and stable  Post vital signs: Reviewed and stable  Last Vitals:  Vitals Value Taken Time  BP    Temp    Pulse    Resp    SpO2      Last Pain:  Vitals:   09/18/18 0856  TempSrc:   PainSc: 0-No pain         Complications: No apparent anesthesia complications

## 2018-09-18 NOTE — Anesthesia Procedure Notes (Signed)
Date/Time: 09/18/2018 11:10 AM Performed by: Allean Found, CRNA Pre-anesthesia Checklist: Patient identified, Emergency Drugs available, Suction available, Patient being monitored and Timeout performed Patient Re-evaluated:Patient Re-evaluated prior to induction Oxygen Delivery Method: Simple face mask Preoxygenation: Pre-oxygenation with 100% oxygen Induction Type: IV induction Placement Confirmation: positive ETCO2

## 2018-09-18 NOTE — H&P (Signed)
Melinda [name] is an 47 y.o. female.   Chief Complaint: Patient has chronic depression but no worse than usual. HPI: Long-standing chronic depression held baby in part with maintenance ECT  Past Medical History:  Diagnosis Date  . Depression   . Diabetes mellitus without complication ([place])   . Diabetic peripheral neuropathy ([place]) 03/07/15  . Diabetic peripheral neuropathy ([place]) 03/07/15  . Diabetic peripheral neuropathy ([place]) 03/07/15  . GERD (gastroesophageal reflux disease)   . Hypercholesterolemia 03/07/15  . Hypertension   . Obesity 03/07/15  . Personality disorder ([place]) 03/07/15  . Sinus tachycardia 03/07/15   history of  . Suicidal thoughts 03/07/15    Past Surgical History:  Procedure Laterality Date  . electroconvulsion therapy  03/07/15    Family History  Problem Relation Age of Onset  . Hypertension Father   . Diabetes Mother    Social History:  reports that she has never smoked. She has never used smokeless tobacco. She reports that she does not drink alcohol or use drugs.  Allergies:  Allergies  Allergen Reactions  . Prednisone     Increases blood sugar     (Not in a hospital admission)  Results for orders placed or performed during the hospital encounter of 09/18/18 (from the past 48 hour(s))  Pregnancy, urine POC     Status: None   Collection Time: 09/18/18  8:36 AM  Result Value Ref Range   Preg Test, Ur NEGATIVE NEGATIVE    Comment:        THE SENSITIVITY OF THIS METHODOLOGY IS >24 mIU/mL   Glucose, capillary     Status: Abnormal   Collection Time: 09/18/18  8:46 AM  Result Value Ref Range   Glucose-Capillary 144 (H) 70 - 99 mg/dL   No results found.  Review of Systems  Constitutional: Negative.   HENT: Negative.   Eyes: Negative.   Respiratory: Negative.   Cardiovascular: Negative.   Gastrointestinal: Negative.   Musculoskeletal: Negative.   Skin: Negative.   Neurological: Negative.   Psychiatric/Behavioral: Negative.     Blood  pressure (!) 180/65, pulse 98, temperature (!) 97.4 F (36.3 C), temperature source Oral, resp. rate 17, height 5\' 4"  (1.626 m), weight 118.4 kg, SpO2 98 %. Physical Exam  Nursing note and vitals reviewed. Constitutional: She appears well-developed and well-nourished.  HENT:  Head: Normocephalic and atraumatic.  Eyes: Pupils are equal, round, and reactive to light. Conjunctivae are normal.  Neck: Normal range of motion.  Cardiovascular: Normal heart sounds.  Respiratory: Effort normal.  GI: Soft.  Musculoskeletal: Normal range of motion.  Neurological: She is alert.  Skin: Skin is warm and dry.  Psychiatric: Her speech is normal and behavior is normal. Judgment and thought content normal. Her affect is blunt. Cognition and memory are normal.     Assessment/Plan Supportive counseling review of medication.  I will refill her ziprasidone.  We can see her back in another 2 weeks as usual.  [name], MD 09/18/2018, 11:01 AM

## 2018-09-18 NOTE — Procedures (Signed)
ECT SERVICES Physician's Interval Evaluation & Treatment Note  Patient Identification: Melinda [name] MRN:  [id number] Date of Evaluation:  09/18/2018 TX #: [id number]  MADRS:   MMSE:   P.E. Findings:  No change to physical exam  Psychiatric Interval Note:  Chronic depression no worse than usual no suicidal ideation psychosis  Subjective:  Patient is a 47 y.o. female seen for evaluation for Electroconvulsive Therapy. Chronic depression same as usual.  Treatment Summary:   []   Right Unilateral             [x]  Bilateral   % Energy : 1.0 ms 35%   Impedance: 1090 ohms  Seizure Energy Index: 10,063 V squared  Postictal Suppression Index: 83%  Seizure Concordance Index: 98%  Medications  Pre Shock: Robinul 0.4 mg Toradol 30 mg labetalol 20 mg esmolol 20 mg Brevital 70 mg succinylcholine 100 mg  Post Shock:    Seizure Duration: About 40 seconds by EMG.  The leads did not read very accurately.  78 seconds by EEG   Comments: Follow-up 2 weeks as usual  Lungs:  [x]   Clear to auscultation               []  Other:   Heart:    [x]   Regular rhythm             []  irregular rhythm    [x]   Previous H&P reviewed, patient examined and there are NO CHANGES                 []   Previous H&P reviewed, patient examined and there are changes noted.   [name], MD 11/8/201911:03 AM

## 2018-09-18 NOTE — Anesthesia Postprocedure Evaluation (Signed)
Anesthesia Post Note  Patient: Melinda [name]  Procedure(s) Performed: ECT TX  Patient location during evaluation: PACU Anesthesia Type: General Level of consciousness: awake and alert Pain management: pain level controlled Vital Signs Assessment: post-procedure vital signs reviewed and stable Respiratory status: spontaneous breathing, nonlabored ventilation, respiratory function stable and patient connected to nasal cannula oxygen Cardiovascular status: blood pressure returned to baseline and stable Postop Assessment: no apparent nausea or vomiting Anesthetic complications: no     Last Vitals:  Vitals:   09/18/18 1148 09/18/18 1151  BP: 109/83 (!) 134/92  Pulse: 85 84  Resp: 16 16  Temp:  36.6 C  SpO2: 95%     Last Pain:  Vitals:   09/18/18 1151  TempSrc: Oral  PainSc: 3                  Ashe Graybeal S

## 2018-09-25 ENCOUNTER — Other Ambulatory Visit: Payer: Self-pay | Admitting: Psychiatry

## 2018-10-01 ENCOUNTER — Other Ambulatory Visit: Payer: Self-pay | Admitting: Psychiatry

## 2018-10-02 ENCOUNTER — Encounter: Payer: Self-pay | Admitting: Anesthesiology

## 2018-10-02 ENCOUNTER — Encounter (HOSPITAL_COMMUNITY)
Admission: RE | Admit: 2018-10-02 | Discharge: 2018-10-02 | Disposition: A | Discharge status: Home / Self Care | Payer: Medicare PPO | Source: Ambulatory Visit | Attending: Psychiatry | Admitting: Psychiatry

## 2018-10-02 DIAGNOSIS — E7800 Pure hypercholesterolemia, unspecified: Secondary | ICD-10-CM | POA: Diagnosis not present

## 2018-10-02 DIAGNOSIS — F339 Major depressive disorder, recurrent, unspecified: Secondary | ICD-10-CM | POA: Diagnosis not present

## 2018-10-02 DIAGNOSIS — G4730 Sleep apnea, unspecified: Secondary | ICD-10-CM | POA: Diagnosis not present

## 2018-10-02 DIAGNOSIS — K219 Gastro-esophageal reflux disease without esophagitis: Secondary | ICD-10-CM | POA: Diagnosis not present

## 2018-10-02 DIAGNOSIS — E1140 Type 2 diabetes mellitus with diabetic neuropathy, unspecified: Secondary | ICD-10-CM | POA: Diagnosis not present

## 2018-10-02 DIAGNOSIS — Z888 Allergy status to other drugs, medicaments and biological substances status: Secondary | ICD-10-CM | POA: Diagnosis not present

## 2018-10-02 DIAGNOSIS — F332 Major depressive disorder, recurrent severe without psychotic features: Secondary | ICD-10-CM

## 2018-10-02 DIAGNOSIS — I10 Essential (primary) hypertension: Secondary | ICD-10-CM | POA: Diagnosis not present

## 2018-10-02 DIAGNOSIS — E1142 Type 2 diabetes mellitus with diabetic polyneuropathy: Secondary | ICD-10-CM | POA: Diagnosis not present

## 2018-10-02 DIAGNOSIS — Z3202 Encounter for pregnancy test, result negative: Secondary | ICD-10-CM | POA: Diagnosis not present

## 2018-10-02 LAB — GLUCOSE, CAPILLARY
Glucose-Capillary: 126 mg/dL — ABNORMAL HIGH (ref 70–99)
Glucose-Capillary: 143 mg/dL — ABNORMAL HIGH (ref 70–99)

## 2018-10-02 MED ORDER — KETOROLAC TROMETHAMINE 30 MG/ML IJ SOLN
INTRAMUSCULAR | Status: AC
  Administered 2018-10-02: 30 mg via INTRAVENOUS
  Filled 2018-10-02: qty 1

## 2018-10-02 MED ORDER — ESMOLOL HCL 100 MG/10ML IV SOLN
INTRAVENOUS | Status: DC | PRN
  Administered 2018-10-02: 20 mg via INTRAVENOUS

## 2018-10-02 MED ORDER — METHOHEXITAL SODIUM 100 MG/10ML IV SOSY
PREFILLED_SYRINGE | INTRAVENOUS | Status: DC | PRN
  Administered 2018-10-02: 70 mg via INTRAVENOUS

## 2018-10-02 MED ORDER — KETOROLAC TROMETHAMINE 30 MG/ML IJ SOLN
30.0000 mg | Freq: Once | INTRAMUSCULAR | Status: AC
  Administered 2018-10-02: 30 mg via INTRAVENOUS

## 2018-10-02 MED ORDER — LABETALOL HCL 5 MG/ML IV SOLN
INTRAVENOUS | Status: DC | PRN
  Administered 2018-10-02: 20 mg via INTRAVENOUS

## 2018-10-02 MED ORDER — GLYCOPYRROLATE 0.2 MG/ML IJ SOLN
INTRAMUSCULAR | Status: AC
  Administered 2018-10-02: 0.4 mg via INTRAVENOUS
  Filled 2018-10-02: qty 2

## 2018-10-02 MED ORDER — SUCCINYLCHOLINE CHLORIDE 20 MG/ML IJ SOLN
INTRAMUSCULAR | Status: DC | PRN
  Administered 2018-10-02: 100 mg via INTRAVENOUS

## 2018-10-02 MED ORDER — SODIUM CHLORIDE 0.9 % IV SOLN
500.0000 mL | Freq: Once | INTRAVENOUS | Status: AC
  Administered 2018-10-02: 500 mL via INTRAVENOUS

## 2018-10-02 MED ORDER — GLYCOPYRROLATE 0.2 MG/ML IJ SOLN
0.4000 mg | Freq: Once | INTRAMUSCULAR | Status: AC
  Administered 2018-10-02: 0.4 mg via INTRAVENOUS

## 2018-10-02 NOTE — Anesthesia Procedure Notes (Signed)
Performed by: Raizy Auzenne, CRNA Pre-anesthesia Checklist: Patient identified, Emergency Drugs available, Suction available and Patient being monitored Patient Re-evaluated:Patient Re-evaluated prior to induction Oxygen Delivery Method: Circle system utilized Preoxygenation: Pre-oxygenation with 100% oxygen Induction Type: IV induction Ventilation: Mask ventilation without difficulty and Mask ventilation throughout procedure Airway Equipment and Method: Bite block Placement Confirmation: positive ETCO2 Dental Injury: Teeth and Oropharynx as per pre-operative assessment        

## 2018-10-02 NOTE — Discharge Instructions (Signed)
1)  The drugs that you have been given will stay in your system until tomorrow so for the       next 24 hours you should not:  A. Drive an automobile  B. Make any legal decisions  C. Drink any alcoholic beverages  2)  You may resume your regular meals upon return home.  3)  A responsible adult must take you home.  Someone should stay with you for a few          hours, then be available by phone for the remainder of the treatment day.  4)  You May experience any of the following symptoms:  Headache, Nausea and a dry mouth (due to the medications you were given),  temporary memory loss and some confusion, or sore muscles (a warm bath  should help this).  If you you experience any of these symptoms let us know on                your return visit.  5)  Report any of the following: any acute discomfort, severe headache, or temperature        greater than 100.5 F.   Also report any unusual redness, swelling, drainage, or pain         at your IV site.    You may report Symptoms to:  [place] [facility]          Phone: [phone], ECT Department           or Dr. Prescott Gum office [phone]  6)  Your next ECT Treatment is Friday December 6 th   We will call 2 days prior to your scheduled appointment for arrival times.  7)  Nothing to eat or drink after midnight the night before your procedure.  8)  Take blood pressure medication   With a sip of water the morning of your procedure.  9)  Other Instructions: Call [phone] to cancel the morning of your procedure due         to illness or emergency.  10) We will call within 72 hours to assess how you are feeling.

## 2018-10-02 NOTE — Anesthesia Post-op Follow-up Note (Signed)
Anesthesia QCDR form completed.        

## 2018-10-02 NOTE — H&P (Signed)
Melinda [name] is an 47 y.o. female.   Chief Complaint: Chronic dysphoria no change from baseline HPI: Recurrent depression with good response to ECT  Past Medical History:  Diagnosis Date  . Depression   . Diabetes mellitus without complication ([place])   . Diabetic peripheral neuropathy ([place]) 03/07/15  . Diabetic peripheral neuropathy ([place]) 03/07/15  . Diabetic peripheral neuropathy ([place]) 03/07/15  . GERD (gastroesophageal reflux disease)   . Hypercholesterolemia 03/07/15  . Hypertension   . Obesity 03/07/15  . Personality disorder ([place]) 03/07/15  . Sinus tachycardia 03/07/15   history of  . Suicidal thoughts 03/07/15    Past Surgical History:  Procedure Laterality Date  . electroconvulsion therapy  03/07/15    Family History  Problem Relation Age of Onset  . Hypertension Father   . Diabetes Mother    Social History:  reports that she has never smoked. She has never used smokeless tobacco. She reports that she does not drink alcohol or use drugs.  Allergies:  Allergies  Allergen Reactions  . Prednisone     Increases blood sugar     (Not in a hospital admission)  Results for orders placed or performed during the hospital encounter of 10/02/18 (from the past 48 hour(s))  Glucose, capillary     Status: Abnormal   Collection Time: 10/02/18  9:20 AM  Result Value Ref Range   Glucose-Capillary 126 (H) 70 - 99 mg/dL   No results found.  Review of Systems  Constitutional: Negative.   HENT: Negative.   Eyes: Negative.   Respiratory: Negative.   Cardiovascular: Negative.   Gastrointestinal: Negative.   Musculoskeletal: Negative.   Skin: Negative.   Neurological: Negative.   Psychiatric/Behavioral: Negative.     Blood pressure (!) 148/65, pulse 92, temperature 98.3 F (36.8 C), temperature source Oral, resp. rate 18, height 5\' 4"  (1.626 m), weight 118 kg. Physical Exam  Nursing note and vitals reviewed. Constitutional: She appears well-developed and well-nourished.   HENT:  Head: Normocephalic and atraumatic.  Eyes: Pupils are equal, round, and reactive to light. Conjunctivae are normal.  Neck: Normal range of motion.  Cardiovascular: Regular rhythm and normal heart sounds.  Respiratory: Effort normal.  GI: Soft.  Musculoskeletal: Normal range of motion.  Neurological: She is alert.  Skin: Skin is warm and dry.  Psychiatric: She has a normal mood and affect. Her behavior is normal. Judgment and thought content normal.     Assessment/Plan Maintenance ECT today follow-up in another couple weeks as usual program  [name], MD 10/02/2018, 10:45 AM

## 2018-10-02 NOTE — Anesthesia Postprocedure Evaluation (Signed)
Anesthesia Post Note  Patient: Melinda [name]  Procedure(s) Performed: ECT TX  Patient location during evaluation: PACU Anesthesia Type: General Level of consciousness: awake and alert Pain management: pain level controlled Vital Signs Assessment: post-procedure vital signs reviewed and stable Respiratory status: spontaneous breathing, nonlabored ventilation and respiratory function stable Cardiovascular status: blood pressure returned to baseline and stable Postop Assessment: no apparent nausea or vomiting Anesthetic complications: no     Last Vitals:  Vitals:   10/02/18 1126 10/02/18 1130  BP:  121/86  Pulse: 80 80  Resp: 16 13  Temp:  36.8 C  SpO2: 95% 94%    Last Pain:  Vitals:   10/02/18 1130  TempSrc:   PainSc: 0-No pain                 Durenda Hurt

## 2018-10-02 NOTE — Procedures (Signed)
ECT SERVICES Physician's Interval Evaluation & Treatment Note  Patient Identification: Melinda [name] MRN:  [id number] Date of Evaluation:  10/02/2018 TX #: [id number]  MADRS:   MMSE:   P.E. Findings:  No change to physical exam  Psychiatric Interval Note:  Mood stable  Subjective:  Patient is a 47 y.o. female seen for evaluation for Electroconvulsive Therapy. No specific new complaint  Treatment Summary:   []   Right Unilateral             [x]  Bilateral   % Energy : 1.0 ms 35%   Impedance: 1960 ohms  Seizure Energy Index: 7923 V squared  Postictal Suppression Index: 82%  Seizure Concordance Index: 96%  Medications  Pre Shock: Robinul 0.4 mg Toradol 30,000,000 g labetalol 20 mg as well 20 mg Brevital 70 mg succinylcholine 100 mg  Post Shock:    Seizure Duration: 32 seconds by EMG 76 seconds by EEG   Comments: Follow-up 2 weeks  Lungs:  [x]   Clear to auscultation               []  Other:   Heart:    [x]   Regular rhythm             []  irregular rhythm    [x]   Previous H&P reviewed, patient examined and there are NO CHANGES                 []   Previous H&P reviewed, patient examined and there are changes noted.   [name], MD 11/22/201910:47 AM

## 2018-10-02 NOTE — Transfer of Care (Signed)
Immediate Anesthesia Transfer of Care Note  Patient: Melinda [name]  Procedure(s) Performed: ECT TX  Patient Location: PACU  Anesthesia Type:General  Level of Consciousness: alert   Airway & Oxygen Therapy: Patient Spontanous Breathing and Patient connected to face mask oxygen  Post-op Assessment: Report given to RN  Post vital signs: stable  Last Vitals:  Vitals Value Taken Time  BP    Temp    Pulse 100 10/02/2018 11:00 AM  Resp 33 10/02/2018 10:58 AM  SpO2 95 % 10/02/2018 11:00 AM  Vitals shown include unvalidated device data.  Last Pain:  Vitals:   10/02/18 0906  TempSrc: Oral  PainSc: 0-No pain         Complications: No apparent anesthesia complications

## 2018-10-02 NOTE — Anesthesia Preprocedure Evaluation (Addendum)
Anesthesia Evaluation  Patient identified by MRN, date of birth, ID band Patient awake    Reviewed: Allergy & Precautions, H&P , NPO status , Patient's Chart, lab work & pertinent test results  Airway Mallampati: III       Dental  (+) Teeth Intact   Pulmonary sleep apnea ,           Cardiovascular hypertension,      Neuro/Psych PSYCHIATRIC DISORDERS Depression Bipolar Disorder  Neuromuscular disease    GI/Hepatic Neg liver ROS, GERD  ,  Endo/Other  diabetes  Renal/GU negative Renal ROS  negative genitourinary   Musculoskeletal   Abdominal   Peds  Hematology negative hematology ROS (+)   Anesthesia Other Findings Past Medical History: No date: Depression No date: Diabetes mellitus without complication ([place]) 3/60/67: Diabetic peripheral neuropathy ([place]) 03/07/15: Diabetic peripheral neuropathy ([place]) 03/07/15: Diabetic peripheral neuropathy ([facility]) No date: GERD (gastroesophageal reflux disease) 03/07/15: Hypercholesterolemia No date: Hypertension 03/07/15: Obesity 03/07/15: Personality disorder ([place]) 03/07/15: Sinus tachycardia     Comment:  history of 03/07/15: Suicidal thoughts  Past Surgical History: 03/07/15: electroconvulsion therapy     Reproductive/Obstetrics negative OB ROS                            Anesthesia Physical Anesthesia Plan  ASA: III  Anesthesia Plan: General   Post-op Pain Management:    Induction:   PONV Risk Score and Plan:   Airway Management Planned: Mask  Additional Equipment:   Intra-op Plan:   Post-operative Plan:   Informed Consent: I have reviewed the patients History and Physical, chart, labs and discussed the procedure including the risks, benefits and alternatives for the proposed anesthesia with the patient or authorized representative who has indicated his/her understanding and acceptance.   Dental Advisory Given  Plan Discussed with:  Anesthesiologist  Anesthesia Plan Comments:        Anesthesia Quick Evaluation

## 2018-10-05 ENCOUNTER — Telehealth: Payer: Self-pay | Admitting: *Deleted

## 2018-10-06 ENCOUNTER — Ambulatory Visit (INDEPENDENT_AMBULATORY_CARE_PROVIDER_SITE_OTHER): Payer: Medicare PPO | Admitting: Physician Assistant

## 2018-10-06 ENCOUNTER — Encounter: Payer: Self-pay | Admitting: Physician Assistant

## 2018-10-06 VITALS — BP 140/85 | HR 99 | Temp 98.4°F | Wt 263.0 lb

## 2018-10-06 DIAGNOSIS — E785 Hyperlipidemia, unspecified: Secondary | ICD-10-CM | POA: Diagnosis not present

## 2018-10-06 DIAGNOSIS — Z114 Encounter for screening for human immunodeficiency virus [HIV]: Secondary | ICD-10-CM

## 2018-10-06 DIAGNOSIS — Z23 Encounter for immunization: Secondary | ICD-10-CM

## 2018-10-06 DIAGNOSIS — I10 Essential (primary) hypertension: Secondary | ICD-10-CM

## 2018-10-06 DIAGNOSIS — F322 Major depressive disorder, single episode, severe without psychotic features: Secondary | ICD-10-CM | POA: Diagnosis not present

## 2018-10-06 DIAGNOSIS — E7800 Pure hypercholesterolemia, unspecified: Secondary | ICD-10-CM | POA: Diagnosis not present

## 2018-10-06 DIAGNOSIS — E1169 Type 2 diabetes mellitus with other specified complication: Secondary | ICD-10-CM

## 2018-10-06 DIAGNOSIS — E1142 Type 2 diabetes mellitus with diabetic polyneuropathy: Secondary | ICD-10-CM

## 2018-10-06 LAB — POCT GLYCOSYLATED HEMOGLOBIN (HGB A1C): Hemoglobin A1C: 6.9 % — AB (ref 4.0–5.6)

## 2018-10-06 MED ORDER — LISINOPRIL 10 MG PO TABS: 10.0000 mg | ORAL_TABLET | Freq: Every day | ORAL | 0 refills | Status: DC

## 2018-10-06 NOTE — Progress Notes (Signed)
Patient: Melinda [name], Female    DOB: [birth year], 47 y.o.   MRN: [id number] Visit Date: 10/14/2018  Today's Provider: Trinna Post, PA-C   Chief Complaint  Patient presents with  . New Patient (Initial Visit)   Subjective:    Annual physical exam Melinda [name] is a 47 y.o. female who presents today to re-establish care. She feels fairly well. She reports exercising none. She reports she is sleeping poorly. Last saw Dr. Venia Minks in 2016. Lives in [place] with her son age 63. Currently on disability for severe depression and is seeing Dr. Weber Cooks and undergoing ECT every other Friday.   Diabetes II: seeing Dr. Gabriel Carina for diabetes. She has not seen her since July 2019 and would like to be followed here for diabetes due to the copay being lower. Fasting sugars checked at 4AM in the morning, running in the 100's and sometimes 90's. Currently taking actos 7.5 mg daily. Eye exam not UTD, patient reports she felt she did not need one as her glasses are working fine for her. She reports she eats donuts every morning for breakfast and has a schedule of eating including fried chicken on Thursday's. She reports she cannot eat anything else besides donuts for breakfast except for maybe pancakes, reports she eats only three of these.   HTN: Not currently on any blood pressure medication, says she was told to stop taking it. Blood pressures are tracked every other week at ECT and are elevated, and this is with the administration of metoprolol during the sessions. She was on lisinopril historically but reports she was told to stop taking it due to her not having issues with her blood pressure anymore.  HLD: Currently taking lipitor 10 mg daily.   Depression: patient reports she is on disability for this. She is followed by Dr. Weber Cooks and currently undergoes ECT every other week. She is also on Lexparo 20 mg daily, Geodon 80 mg BID and wellbutrin xl 300 mg QD. She reports she has cut her  arms in the past with razor blades, reports she has not done so in years.   Affordability of Medications: Reports she gets medications through mail order and that they are mostly affordable to her.   Transport: She is driven here by her parents, typically. She reports her mother broke her ankle and is not able to drive her recently. She is driven here today by her father.   Vaginal Discharge: patient reports she has an issue with vaginal discharge and itching today.  -----------------------------------------------------------------   Review of Systems  Constitutional: Positive for chills, diaphoresis and fatigue.  HENT: Positive for dental problem, drooling and sneezing.   Eyes: Positive for redness and itching.  Respiratory: Positive for cough.   Cardiovascular: Negative.   Gastrointestinal: Positive for diarrhea.  Endocrine: Negative.   Genitourinary: Positive for vaginal discharge.  Musculoskeletal: Positive for myalgias, neck pain and neck stiffness.  Skin: Positive for rash. Negative for wound.  Allergic/Immunologic: Negative.   Neurological: Negative.   Hematological: Negative.   Psychiatric/Behavioral: Positive for dysphoric mood, self-injury and sleep disturbance.       Patient admits to cutting her arms in the past.    Social History She  reports that she has never smoked. She has never used smokeless tobacco. She reports that she does not drink alcohol or use drugs. Social History   Socioeconomic History  . Marital status: Single    Spouse name: Not on file  .  Number of children: 1  . Years of education: Not on file  . Highest education level: Not on file  Occupational History  . Occupation: Disabled  Social Needs  . Financial resource strain: Not on file  . Food insecurity:    Worry: Not on file    Inability: Not on file  . Transportation needs:    Medical: Not on file    Non-medical: Not on file  Tobacco Use  . Smoking status: Never Smoker  . Smokeless  tobacco: Never Used  Substance and Sexual Activity  . Alcohol use: No  . Drug use: No  . Sexual activity: Not Currently    Birth control/protection: Abstinence  Lifestyle  . Physical activity:    Days per week: Not on file    Minutes per session: Not on file  . Stress: Not on file  Relationships  . Social connections:    Talks on phone: Not on file    Gets together: Not on file    Attends religious service: Not on file    Active member of club or organization: Not on file    Attends meetings of clubs or organizations: Not on file    Relationship status: Not on file  Other Topics Concern  . Not on file  Social History Narrative  . Not on file    Patient Active Problem List   Diagnosis Date Noted  . Affective bipolar disorder ([place]) 04/28/2015  . Cardiomyopathy ([place]) 04/28/2015  . Diabetes mellitus, type 2 ([place]) 04/28/2015  . Hypercholesteremia 04/28/2015  . BP (high blood pressure) 04/28/2015  . Morbid obesity ([place]) 04/28/2015  . Disorder of peripheral nervous system 04/28/2015  . Severe depression ([place]) 04/28/2015  . Apnea, sleep 04/28/2015  . Attempted suicide ([place]) 04/28/2015  . Severe recurrent major depression without psychotic features ([place])   . DIABETES MELLITUS, TYPE II 09/09/2007  . DEPRESSION 09/09/2007  . PERIPHERAL NEUROPATHY 09/09/2007  . HYPERTENSION 09/09/2007  . GERD 09/09/2007    Past Surgical History:  Procedure Laterality Date  . electroconvulsion therapy  03/07/15    Family History  Family Status  Relation Name Status  . Father Donnetta Simpers ( [place]) Alive  . Mother [employer]  . Sister [employer]  . Sister  Alive  . Sister  Alive   Her family history includes Diabetes in her mother; Hypertension in her father.     Allergies  Allergen Reactions  . Prednisone     Increases blood sugar    Previous Medications   ATORVASTATIN (LIPITOR) 10 MG TABLET    Take 10 mg by mouth at bedtime.   BUPROPION (WELLBUTRIN XL) 300 MG 24 HR TABLET    TAKE 1  TABLET EVERY DAY   ESCITALOPRAM (LEXAPRO) 20 MG TABLET    TAKE 1 TABLET (20 MG TOTAL) BY MOUTH DAILY.   LANCETS MISC. (ACCU-CHEK FASTCLIX LANCET) KIT       PIOGLITAZONE (ACTOS) 30 MG TABLET    Take 1 tablet (30 mg total) by mouth daily.   ZIPRASIDONE (GEODON) 80 MG CAPSULE    TAKE 1 CAPSULE TWO TIMES DAILY WITH A MEAL.    Patient Care Team: Paulene Floor as PCP - General (Physician Assistant)      Objective:   Vitals: BP 140/85 (BP Location: Left Arm, Patient Position: Sitting, Cuff Size: Large)   Pulse 99   Temp 98.4 F (36.9 C) (Oral)   Wt 263 lb (119.3 kg)   SpO2 97%   BMI 45.14  kg/m    Physical Exam  Constitutional: She is oriented to person, place, and time. She appears well-developed and well-nourished.  Cardiovascular: Normal rate and regular rhythm.  Pulmonary/Chest: Effort normal and breath sounds normal.  Neurological: She is alert and oriented to person, place, and time.  Appears slow to interpret verbal questioning and also slow with expressive speech.   Skin: Skin is warm and dry.  Multiple keloid scars on arms from patient's reported self harm.   Psychiatric: She has a normal mood and affect. Her behavior is normal.     Depression Screen PHQ 2/9 Scores 10/06/2018 06/14/2015  PHQ - 2 Score 4 -  PHQ- 9 Score 15 -  Exception Documentation - Other- indicate reason in comment box  Not completed - Pt has severe depression; has weekly ECT Treatments, and being followed very closely by a psychiatry.   Diabetic Foot Exam - Simple   Simple Foot Form Diabetic Foot exam was performed with the following findings:  Yes 10/06/2018  9:42 AM  Visual Inspection No deformities, no ulcerations, no other skin breakdown bilaterally:  Yes Sensation Testing Intact to touch and monofilament testing bilaterally:  Yes Pulse Check Posterior Tibialis and Dorsalis pulse intact bilaterally:  Yes Comments        Assessment & Plan:     Routine Health Maintenance and  Physical Exam  Exercise Activities and Dietary recommendations Goals   None     Immunization History  Administered Date(s) Administered  . Hepatitis B 12/16/2013  . Influenza,inj,Quad PF,6+ Mos 10/06/2018  . Pneumococcal Polysaccharide-23 12/16/2013    Health Maintenance  Topic Date Due  . OPHTHALMOLOGY EXAM  01/01/1979  . TETANUS/TDAP  01/02/1988  . PAP SMEAR  01/01/1990  . MAMMOGRAM  07/09/2017  . HEMOGLOBIN A1C  04/06/2019  . FOOT EXAM  10/07/2019  . INFLUENZA VACCINE  Completed  . PNEUMOCOCCAL POLYSACCHARIDE VACCINE AGE 16-64 HIGH RISK  Completed  . HIV Screening  Completed     Discussed health benefits of physical activity, and encouraged her to engage in regular exercise appropriate for her age and condition.    1. Essential hypertension  Her BP is elevated and has been on multiple documented instances. I do not see where she was told to stop Lisinopril but will have her restart.  - lisinopril (PRINIVIL,ZESTRIL) 10 MG tablet; Take 1 tablet (10 mg total) by mouth daily.  Dispense: 90 tablet; Refill: 0 - Comprehensive Metabolic Panel (CMET) - TSH  2. Type 2 diabetes mellitus with diabetic polyneuropathy, without long-term current use of insulin (HCC)  Poor diet with no expressed motivation by patient in initiate any change. I do wonder if she has some cognitive impairment from repeated and frequent ECT treatments. However, I am able to have a conversation with her about how Dr. Gabriel Carina was not too pleased to hear she ate donuts every day for breakfast. I asked her why this might be and she says that it is probably because it has a lot of sugar and is not good for her diabetes, so she does have an understanding of how her diet is extremely detrimental to her health. Surprisingly her sugar is well controlled and patient seems to take this as validation that it is OK to continue eating the way she does. Have had extensive conversation about this. Unsure why she is not on  metformin however she is stable on current medications and she can continue.  - Ambulatory referral to Ophthalmology - Comprehensive Metabolic Panel (CMET) - CBC  with Differential - POCT HgB A1C - Urine Microalbumin w/creat. ratio  3. Encounter for screening for HIV  - HIV antibody (with reflex)  4. Morbid obesity ([place])  Discussed in detail that she should not eat donuts or pancakes or fried chicken and that her dietary choices need a major overhaul.  - Comprehensive Metabolic Panel (CMET)  5. Hyperlipidemia associated with type 2 diabetes mellitus (HCC)  - Lipid Profile  6. Need for influenza vaccination  - Flu Vaccine QUAD 36+ mos IM  7. Severe depression ([place])  She is followed by Dr. Weber Cooks for ECT and medication management.  Return in about 3 weeks (around 10/27/2018) for vaginal discharge and PAP. Marland Kitchen  The entirety of the information documented in the History of Present Illness, Review of Systems and Physical Exam were personally obtained by me. Portions of this information were initially documented by Hurman Horn, CMA and reviewed by me for thoroughness and accuracy.    --------------------------------------------------------------------

## 2018-10-07 ENCOUNTER — Telehealth: Payer: Self-pay

## 2018-10-07 LAB — COMPREHENSIVE METABOLIC PANEL
ALT: 15 IU/L (ref 0–32)
AST: 15 IU/L (ref 0–40)
Albumin/Globulin Ratio: 1.4 (ref 1.2–2.2)
Albumin: 4.6 g/dL (ref 3.5–5.5)
Alkaline Phosphatase: 175 IU/L — ABNORMAL HIGH (ref 39–117)
BUN/Creatinine Ratio: 12 (ref 9–23)
BUN: 10 mg/dL (ref 6–24)
Bilirubin Total: 0.3 mg/dL (ref 0.0–1.2)
CO2: 20 mmol/L (ref 20–29)
Calcium: 9.8 mg/dL (ref 8.7–10.2)
Chloride: 99 mmol/L (ref 96–106)
Creatinine, Ser: 0.84 mg/dL (ref 0.57–1.00)
GFR calc Af Amer: 94 mL/min/{1.73_m2} (ref >59)
GFR calc non Af Amer: 82 mL/min/{1.73_m2} (ref >59)
Globulin, Total: 3.2 g/dL (ref 1.5–4.5)
Glucose: 144 mg/dL — ABNORMAL HIGH (ref 65–99)
Potassium: 4.1 mmol/L (ref 3.5–5.2)
Sodium: 138 mmol/L (ref 134–144)
Total Protein: 7.8 g/dL (ref 6.0–8.5)

## 2018-10-07 LAB — CBC WITH DIFFERENTIAL/PLATELET
Basophils Absolute: 0 10*3/uL (ref 0.0–0.2)
Basos: 0 %
EOS (ABSOLUTE): 0.1 10*3/uL (ref 0.0–0.4)
Eos: 1 %
Hematocrit: 37.3 % (ref 34.0–46.6)
Hemoglobin: 12.4 g/dL (ref 11.1–15.9)
Immature Grans (Abs): 0 10*3/uL (ref 0.0–0.1)
Immature Granulocytes: 0 %
Lymphocytes Absolute: 2.9 10*3/uL (ref 0.7–3.1)
Lymphs: 30 %
MCH: 26.6 pg (ref 26.6–33.0)
MCHC: 33.2 g/dL (ref 31.5–35.7)
MCV: 80 fL (ref 79–97)
Monocytes Absolute: 0.7 10*3/uL (ref 0.1–0.9)
Monocytes: 7 %
Neutrophils Absolute: 6 10*3/uL (ref 1.4–7.0)
Neutrophils: 62 %
Platelets: 330 10*3/uL (ref 150–450)
RBC: 4.67 x10E6/uL (ref 3.77–5.28)
RDW: 12.8 % (ref 12.3–15.4)
WBC: 9.7 10*3/uL (ref 3.4–10.8)

## 2018-10-07 LAB — HIV ANTIBODY (ROUTINE TESTING W REFLEX): HIV Screen 4th Generation wRfx: NONREACTIVE

## 2018-10-07 LAB — LIPID PANEL
Chol/HDL Ratio: 3 ratio (ref 0.0–4.4)
Cholesterol, Total: 160 mg/dL (ref 100–199)
HDL: 54 mg/dL (ref >39)
LDL Calculated: 87 mg/dL (ref 0–99)
Triglycerides: 96 mg/dL (ref 0–149)
VLDL Cholesterol Cal: 19 mg/dL (ref 5–40)

## 2018-10-07 LAB — TSH: TSH: 4.22 u[IU]/mL (ref 0.450–4.500)

## 2018-10-07 LAB — MICROALBUMIN / CREATININE URINE RATIO
Creatinine, Urine: 190.3 mg/dL
Microalb/Creat Ratio: 2.9 mg/g creat (ref 0.0–30.0)
Microalbumin, Urine: 5.5 ug/mL

## 2018-10-07 NOTE — Telephone Encounter (Signed)
Patient advised as below. Patient verbalizes understanding and is in agreement with treatment plan.  

## 2018-10-07 NOTE — Telephone Encounter (Signed)
Patient was advised.  

## 2018-10-07 NOTE — Telephone Encounter (Signed)
-----   Message from Trinna Post, [place] sent at 10/07/2018  8:59 AM EST ----- Sugar high but A1c controlled. Her alkaline phosphatase is high, this can be from liver or bone. Is she having any RUQ pain? Does she still have a gallbladder? We can talk more about it at follow up. Remaining labwork normal. Be sure to restart lisinopril and continue other current medications.

## 2018-10-07 NOTE — Telephone Encounter (Signed)
-----   Message from Trinna Post, [place] sent at 10/07/2018  1:47 PM EST ----- Urine test to check for protein in urine is normal.

## 2018-10-14 ENCOUNTER — Telehealth: Payer: Self-pay

## 2018-10-16 ENCOUNTER — Encounter: Payer: Self-pay | Admitting: Anesthesiology

## 2018-10-16 ENCOUNTER — Other Ambulatory Visit: Payer: Self-pay | Admitting: Psychiatry

## 2018-10-16 ENCOUNTER — Encounter
Admission: RE | Admit: 2018-10-16 | Discharge: 2018-10-16 | Disposition: A | Discharge status: Home / Self Care | Payer: Medicare PPO | Source: Ambulatory Visit | Attending: Psychiatry | Admitting: Psychiatry

## 2018-10-16 DIAGNOSIS — E1142 Type 2 diabetes mellitus with diabetic polyneuropathy: Secondary | ICD-10-CM | POA: Diagnosis not present

## 2018-10-16 DIAGNOSIS — F332 Major depressive disorder, recurrent severe without psychotic features: Secondary | ICD-10-CM | POA: Diagnosis not present

## 2018-10-16 DIAGNOSIS — F609 Personality disorder, unspecified: Secondary | ICD-10-CM | POA: Insufficient documentation

## 2018-10-16 DIAGNOSIS — K219 Gastro-esophageal reflux disease without esophagitis: Secondary | ICD-10-CM | POA: Insufficient documentation

## 2018-10-16 DIAGNOSIS — I10 Essential (primary) hypertension: Secondary | ICD-10-CM | POA: Insufficient documentation

## 2018-10-16 DIAGNOSIS — F329 Major depressive disorder, single episode, unspecified: Secondary | ICD-10-CM | POA: Diagnosis not present

## 2018-10-16 DIAGNOSIS — E669 Obesity, unspecified: Secondary | ICD-10-CM | POA: Diagnosis not present

## 2018-10-16 DIAGNOSIS — G4730 Sleep apnea, unspecified: Secondary | ICD-10-CM | POA: Diagnosis not present

## 2018-10-16 DIAGNOSIS — Z6841 Body Mass Index (BMI) 40.0 and over, adult: Secondary | ICD-10-CM | POA: Insufficient documentation

## 2018-10-16 DIAGNOSIS — E1140 Type 2 diabetes mellitus with diabetic neuropathy, unspecified: Secondary | ICD-10-CM | POA: Diagnosis not present

## 2018-10-16 DIAGNOSIS — E7800 Pure hypercholesterolemia, unspecified: Secondary | ICD-10-CM | POA: Diagnosis not present

## 2018-10-16 LAB — GLUCOSE, CAPILLARY: Glucose-Capillary: 125 mg/dL — ABNORMAL HIGH (ref 70–99)

## 2018-10-16 LAB — POCT PREGNANCY, URINE: Preg Test, Ur: NEGATIVE

## 2018-10-16 MED ORDER — METHOHEXITAL SODIUM 0.5 G IJ SOLR
INTRAMUSCULAR | Status: AC
  Filled 2018-10-16: qty 500

## 2018-10-16 MED ORDER — SODIUM CHLORIDE 0.9 % IV SOLN
INTRAVENOUS | Status: DC | PRN
  Administered 2018-10-16: 11:00:00 via INTRAVENOUS

## 2018-10-16 MED ORDER — PROPOFOL 10 MG/ML IV BOLUS
INTRAVENOUS | Status: AC
  Filled 2018-10-16: qty 20

## 2018-10-16 MED ORDER — KETOROLAC TROMETHAMINE 30 MG/ML IJ SOLN
INTRAMUSCULAR | Status: AC
  Filled 2018-10-16: qty 1

## 2018-10-16 MED ORDER — GLYCOPYRROLATE 0.2 MG/ML IJ SOLN
0.4000 mg | Freq: Once | INTRAMUSCULAR | Status: AC
  Administered 2018-10-16: 0.4 mg via INTRAVENOUS

## 2018-10-16 MED ORDER — KETOROLAC TROMETHAMINE 30 MG/ML IJ SOLN
30.0000 mg | Freq: Once | INTRAMUSCULAR | Status: AC
  Administered 2018-10-16: 30 mg via INTRAVENOUS

## 2018-10-16 MED ORDER — GLYCOPYRROLATE 0.2 MG/ML IJ SOLN
INTRAMUSCULAR | Status: AC
  Filled 2018-10-16: qty 2

## 2018-10-16 MED ORDER — SUCCINYLCHOLINE CHLORIDE 20 MG/ML IJ SOLN
INTRAMUSCULAR | Status: DC | PRN
  Administered 2018-10-16: 100 mg via INTRAVENOUS

## 2018-10-16 MED ORDER — ESMOLOL HCL 100 MG/10ML IV SOLN
INTRAVENOUS | Status: DC | PRN
  Administered 2018-10-16: 20 mg via INTRAVENOUS

## 2018-10-16 MED ORDER — LABETALOL HCL 5 MG/ML IV SOLN
INTRAVENOUS | Status: DC | PRN
  Administered 2018-10-16: 30 mg via INTRAVENOUS

## 2018-10-16 MED ORDER — METHOHEXITAL SODIUM 100 MG/10ML IV SOSY
PREFILLED_SYRINGE | INTRAVENOUS | Status: DC | PRN
  Administered 2018-10-16: 70 mg via INTRAVENOUS

## 2018-10-16 MED ORDER — SODIUM CHLORIDE 0.9 % IV SOLN
500.0000 mL | Freq: Once | INTRAVENOUS | Status: AC
  Administered 2018-10-16: 500 mL via INTRAVENOUS

## 2018-10-16 NOTE — Anesthesia Post-op Follow-up Note (Signed)
Anesthesia QCDR form completed.        

## 2018-10-16 NOTE — Anesthesia Postprocedure Evaluation (Signed)
Anesthesia Post Note  Patient: Melinda [name]  Procedure(s) Performed: ECT TX  Patient location during evaluation: PACU Anesthesia Type: General Level of consciousness: awake and alert Pain management: pain level controlled Vital Signs Assessment: post-procedure vital signs reviewed and stable Respiratory status: spontaneous breathing, nonlabored ventilation, respiratory function stable and patient connected to nasal cannula oxygen Cardiovascular status: blood pressure returned to baseline and stable Postop Assessment: no apparent nausea or vomiting Anesthetic complications: no     Last Vitals:  Vitals:   10/16/18 1103 10/16/18 1120  BP: 127/83 134/85  Pulse: (!) 104 97  Resp: (!) 27 16  Temp:  36.8 C  SpO2: 95%     Last Pain:  Vitals:   10/16/18 1120  TempSrc: Oral  PainSc: 0-No pain                 Martha Clan

## 2018-10-16 NOTE — Transfer of Care (Signed)
Immediate Anesthesia Transfer of Care Note  Patient: Melinda [name]  Procedure(s) Performed: ECT TX  Patient Location: PACU  Anesthesia Type:General  Level of Consciousness: sedated  Airway & Oxygen Therapy: Patient Spontanous Breathing  Post-op Assessment: Report given to RN  Post vital signs: Reviewed  Last Vitals:  Vitals Value Taken Time  BP 146/95 10/16/2018 10:52 AM  Temp 36.8 C 10/16/2018 10:52 AM  Pulse 109 10/16/2018 10:53 AM  Resp 23 10/16/2018 10:53 AM  SpO2 91 % 10/16/2018 10:53 AM  Vitals shown include unvalidated device data.  Last Pain:  Vitals:   10/16/18 0932  TempSrc: Oral  PainSc: 0-No pain         Complications: No apparent anesthesia complications

## 2018-10-16 NOTE — H&P (Signed)
Melinda [name] is an 47 y.o. female.   Chief Complaint: Stable. Chronic mild mood symptoms  HPI: history of depression on maintenance treatment  Past Medical History:  Diagnosis Date  . Depression   . Diabetes mellitus without complication ([place])   . Diabetic peripheral neuropathy ([place]) 03/07/15  . Diabetic peripheral neuropathy ([place]) 03/07/15  . Diabetic peripheral neuropathy ([place]) 03/07/15  . GERD (gastroesophageal reflux disease)   . Hypercholesterolemia 03/07/15  . Hypertension   . Obesity 03/07/15  . Personality disorder ([place]) 03/07/15  . Sinus tachycardia 03/07/15   history of  . Suicidal thoughts 03/07/15    Past Surgical History:  Procedure Laterality Date  . electroconvulsion therapy  03/07/15    Family History  Problem Relation Age of Onset  . Hypertension Father   . Diabetes Mother    Social History:  reports that she has never smoked. She has never used smokeless tobacco. She reports that she does not drink alcohol or use drugs.  Allergies:  Allergies  Allergen Reactions  . Prednisone     Increases blood sugar     (Not in a hospital admission)  Results for orders placed or performed during the hospital encounter of 10/16/18 (from the past 48 hour(s))  Glucose, capillary     Status: Abnormal   Collection Time: 10/16/18  9:42 AM  Result Value Ref Range   Glucose-Capillary 125 (H) 70 - 99 mg/dL  Pregnancy, urine POC     Status: None   Collection Time: 10/16/18  9:42 AM  Result Value Ref Range   Preg Test, Ur NEGATIVE NEGATIVE    Comment:        THE SENSITIVITY OF THIS METHODOLOGY IS >24 mIU/mL    No results found.  Review of Systems  Constitutional: Negative.   HENT: Negative.   Eyes: Negative.   Respiratory: Negative.   Cardiovascular: Negative.   Gastrointestinal: Negative.   Musculoskeletal: Negative.   Skin: Negative.   Neurological: Negative.   Psychiatric/Behavioral: Negative for depression, hallucinations, memory loss, substance abuse and  suicidal ideas. The patient is not nervous/anxious and does not have insomnia.     Blood pressure (!) 154/94, pulse 98, temperature 98.4 F (36.9 C), temperature source Oral, resp. rate 16, height 5\' 4"  (1.626 m), weight 120.2 kg, SpO2 98 %. Physical Exam  Nursing note and vitals reviewed. Constitutional: She appears well-developed and well-nourished.  HENT:  Head: Normocephalic and atraumatic.  Eyes: Pupils are equal, round, and reactive to light. Conjunctivae are normal.  Neck: Normal range of motion.  Cardiovascular: Regular rhythm and normal heart sounds.  Respiratory: Effort normal. No respiratory distress.  GI: Soft.  Musculoskeletal: Normal range of motion.  Neurological: She is alert.  Skin: Skin is warm and dry.  Psychiatric: She has a normal mood and affect. Her behavior is normal. Judgment and thought content normal.     Assessment/Plan Treatment today as usual and follow up in 2 weeks  [name], MD 10/16/2018, 10:53 AM

## 2018-10-16 NOTE — Discharge Instructions (Signed)
1)  The drugs that you have been given will stay in your system until tomorrow so for the       next 24 hours you should not:  A. Drive an automobile  B. Make any legal decisions  C. Drink any alcoholic beverages  2)  You may resume your regular meals upon return home.  3)  A responsible adult must take you home.  Someone should stay with you for a few          hours, then be available by phone for the remainder of the treatment day.  4)  You May experience any of the following symptoms:  Headache, Nausea and a dry mouth (due to the medications you were given),  temporary memory loss and some confusion, or sore muscles (a warm bath  should help this).  If you you experience any of these symptoms let us know on                your return visit.  5)  Report any of the following: any acute discomfort, severe headache, or temperature        greater than 100.5 F.   Also report any unusual redness, swelling, drainage, or pain         at your IV site.    You may report Symptoms to:  [place] [facility]          Phone: [phone], ECT Department           or Dr. Prescott Gum office [phone]  6)  Your next ECT Treatment is Friday December 20 at 8:30   We will call 2 days prior to your scheduled appointment for arrival times.  7)  Nothing to eat or drink after midnight the night before your procedure.  8)  Take LISINOPRIL 10 MG     With a sip of water the morning of your procedure.  9)  Other Instructions: Call [phone] to cancel the morning of your procedure due         to illness or emergency.  10) We will call within 72 hours to assess how you are feeling.

## 2018-10-16 NOTE — Anesthesia Procedure Notes (Signed)
Procedure Name: General with mask airway Date/Time: 10/16/2018 10:43 AM Performed by: Leander Rams, CRNA Pre-anesthesia Checklist: Patient identified Patient Re-evaluated:Patient Re-evaluated prior to induction Oxygen Delivery Method: Circle system utilized Preoxygenation: Pre-oxygenation with 100% oxygen Induction Type: IV induction Ventilation: Mask ventilation without difficulty Airway Equipment and Method: Bite block Dental Injury: Teeth and Oropharynx as per pre-operative assessment

## 2018-10-16 NOTE — Procedures (Signed)
ECT SERVICES Physician's Interval Evaluation & Treatment Note  Patient Identification: Melinda [name] MRN:  [id number] Date of Evaluation:  10/16/2018 TX #: [id number]  MADRS: 19  MMSE: 30  P.E. Findings:  No change from baseline  Psychiatric Interval Note:  Stable mood  Subjective:  Patient is a 47 y.o. female seen for evaluation for Electroconvulsive Therapy. None specific  Treatment Summary:   []   Right Unilateral             []  Bilateral   % Energy : 1.56ms,35%   Impedance: 2440 ohms  Seizure Energy Index: 3627 microv squ  Postictal Suppression Index: 88%  Seizure Concordance Index: 93%  Medications  Pre Shock: Robinol 0.4mg ,toradol 30mg ,labetalol 20mg ,esmolol 30mg ,brevital 70mg ,succicnylcholine 100mg   Post Shock:    Seizure Duration: 36sec emg, 72sec eeg   Comments: Follow up 2 weeks   Lungs:  [x]   Clear to auscultation               []  Other:   Heart:    [x]   Regular rhythm             []  irregular rhythm    [x]   Previous H&P reviewed, patient examined and there are NO CHANGES                 []   Previous H&P reviewed, patient examined and there are changes noted.   [name], MD 12/6/201910:55 AM

## 2018-10-16 NOTE — Anesthesia Preprocedure Evaluation (Signed)
Anesthesia Evaluation  Patient identified by MRN, date of birth, ID band Patient awake    Reviewed: Allergy & Precautions, H&P , NPO status , Patient's Chart, lab work & pertinent test results  Airway Mallampati: III       Dental  (+) Teeth Intact   Pulmonary sleep apnea ,           Cardiovascular hypertension,      Neuro/Psych PSYCHIATRIC DISORDERS Depression Bipolar Disorder  Neuromuscular disease    GI/Hepatic Neg liver ROS, GERD  ,  Endo/Other  diabetes  Renal/GU negative Renal ROS  negative genitourinary   Musculoskeletal   Abdominal   Peds  Hematology negative hematology ROS (+)   Anesthesia Other Findings Past Medical History: No date: Depression No date: Diabetes mellitus without complication ([place]) 1/61/09: Diabetic peripheral neuropathy ([place]) 03/07/15: Diabetic peripheral neuropathy ([place]) 03/07/15: Diabetic peripheral neuropathy (HCC) No date: GERD (gastroesophageal reflux disease) 03/07/15: Hypercholesterolemia No date: Hypertension 03/07/15: Obesity 03/07/15: Personality disorder ([place]) 03/07/15: Sinus tachycardia     Comment:  history of 03/07/15: Suicidal thoughts  Past Surgical History: 03/07/15: electroconvulsion therapy     Reproductive/Obstetrics negative OB ROS                             Anesthesia Physical  Anesthesia Plan  ASA: III  Anesthesia Plan: General   Post-op Pain Management:    Induction:   PONV Risk Score and Plan:   Airway Management Planned: Mask  Additional Equipment:   Intra-op Plan:   Post-operative Plan:   Informed Consent: I have reviewed the patients History and Physical, chart, labs and discussed the procedure including the risks, benefits and alternatives for the proposed anesthesia with the patient or authorized representative who has indicated his/her understanding and acceptance.   Dental Advisory Given  Plan Discussed with:  Anesthesiologist  Anesthesia Plan Comments:         Anesthesia Quick Evaluation

## 2018-10-23 ENCOUNTER — Telehealth: Payer: Self-pay

## 2018-10-23 NOTE — Telephone Encounter (Signed)
LM requesting a CB. Need to verify insurance and see if pt is eligible for an AWV. If so, need to schedule apt prior to CPE on 11/12/18. -MM

## 2018-10-26 NOTE — Telephone Encounter (Signed)
Pt returned missed call to [facility]. Please call pt back.  Thanks, [employer]

## 2018-10-26 NOTE — Telephone Encounter (Signed)
Pt states she has had Medicare insurance for over a year. Declined scheduling an AWV prior to her CPE.  -MM

## 2018-10-28 ENCOUNTER — Telehealth: Payer: Self-pay

## 2018-10-29 ENCOUNTER — Other Ambulatory Visit: Payer: Self-pay | Admitting: Psychiatry

## 2018-10-30 ENCOUNTER — Encounter (HOSPITAL_BASED_OUTPATIENT_CLINIC_OR_DEPARTMENT_OTHER)
Admission: RE | Admit: 2018-10-30 | Discharge: 2018-10-30 | Disposition: A | Discharge status: Home / Self Care | Payer: Medicare PPO | Source: Ambulatory Visit | Attending: Psychiatry | Admitting: Psychiatry

## 2018-10-30 ENCOUNTER — Encounter: Payer: Self-pay | Admitting: Anesthesiology

## 2018-10-30 DIAGNOSIS — F332 Major depressive disorder, recurrent severe without psychotic features: Secondary | ICD-10-CM | POA: Diagnosis not present

## 2018-10-30 DIAGNOSIS — E1142 Type 2 diabetes mellitus with diabetic polyneuropathy: Secondary | ICD-10-CM | POA: Diagnosis not present

## 2018-10-30 DIAGNOSIS — G4730 Sleep apnea, unspecified: Secondary | ICD-10-CM | POA: Diagnosis not present

## 2018-10-30 DIAGNOSIS — E7800 Pure hypercholesterolemia, unspecified: Secondary | ICD-10-CM | POA: Diagnosis not present

## 2018-10-30 DIAGNOSIS — Z6841 Body Mass Index (BMI) 40.0 and over, adult: Secondary | ICD-10-CM | POA: Diagnosis not present

## 2018-10-30 DIAGNOSIS — K219 Gastro-esophageal reflux disease without esophagitis: Secondary | ICD-10-CM | POA: Diagnosis not present

## 2018-10-30 DIAGNOSIS — E669 Obesity, unspecified: Secondary | ICD-10-CM | POA: Diagnosis not present

## 2018-10-30 DIAGNOSIS — I10 Essential (primary) hypertension: Secondary | ICD-10-CM | POA: Diagnosis not present

## 2018-10-30 DIAGNOSIS — E1140 Type 2 diabetes mellitus with diabetic neuropathy, unspecified: Secondary | ICD-10-CM | POA: Diagnosis not present

## 2018-10-30 DIAGNOSIS — F329 Major depressive disorder, single episode, unspecified: Secondary | ICD-10-CM | POA: Diagnosis not present

## 2018-10-30 DIAGNOSIS — F609 Personality disorder, unspecified: Secondary | ICD-10-CM | POA: Diagnosis not present

## 2018-10-30 LAB — GLUCOSE, CAPILLARY: Glucose-Capillary: 94 mg/dL (ref 70–99)

## 2018-10-30 MED ORDER — METHOHEXITAL SODIUM 0.5 G IJ SOLR
INTRAMUSCULAR | Status: AC
  Filled 2018-10-30: qty 500

## 2018-10-30 MED ORDER — LABETALOL HCL 5 MG/ML IV SOLN
INTRAVENOUS | Status: DC | PRN
  Administered 2018-10-30: 20 mg via INTRAVENOUS
  Administered 2018-10-30: 10 mg via INTRAVENOUS

## 2018-10-30 MED ORDER — SUCCINYLCHOLINE CHLORIDE 20 MG/ML IJ SOLN
INTRAMUSCULAR | Status: AC
  Filled 2018-10-30: qty 1

## 2018-10-30 MED ORDER — LABETALOL HCL 5 MG/ML IV SOLN
INTRAVENOUS | Status: AC
  Filled 2018-10-30: qty 4

## 2018-10-30 MED ORDER — ESMOLOL HCL 100 MG/10ML IV SOLN
INTRAVENOUS | Status: AC
  Filled 2018-10-30: qty 10

## 2018-10-30 MED ORDER — FENTANYL CITRATE (PF) 100 MCG/2ML IJ SOLN: 25.0000 ug | INTRAMUSCULAR | Status: DC | PRN

## 2018-10-30 MED ORDER — GLYCOPYRROLATE 0.2 MG/ML IJ SOLN
0.4000 mg | Freq: Once | INTRAMUSCULAR | Status: AC
  Administered 2018-10-30: 0.4 mg via INTRAVENOUS

## 2018-10-30 MED ORDER — SODIUM CHLORIDE 0.9 % IV SOLN
INTRAVENOUS | Status: DC | PRN
  Administered 2018-10-30: 10:00:00 via INTRAVENOUS

## 2018-10-30 MED ORDER — SUCCINYLCHOLINE CHLORIDE 200 MG/10ML IV SOSY
PREFILLED_SYRINGE | INTRAVENOUS | Status: DC | PRN
  Administered 2018-10-30: 100 mg via INTRAVENOUS

## 2018-10-30 MED ORDER — METHOHEXITAL SODIUM 100 MG/10ML IV SOSY
PREFILLED_SYRINGE | INTRAVENOUS | Status: DC | PRN
  Administered 2018-10-30: 70 mg via INTRAVENOUS

## 2018-10-30 MED ORDER — GLYCOPYRROLATE 0.2 MG/ML IJ SOLN
INTRAMUSCULAR | Status: AC
  Administered 2018-10-30: 0.4 mg via INTRAVENOUS
  Filled 2018-10-30: qty 2

## 2018-10-30 MED ORDER — ONDANSETRON HCL 4 MG/2ML IJ SOLN: 4.0000 mg | Freq: Once | INTRAMUSCULAR | Status: DC | PRN

## 2018-10-30 MED ORDER — KETOROLAC TROMETHAMINE 30 MG/ML IJ SOLN
INTRAMUSCULAR | Status: AC
  Administered 2018-10-30: 30 mg via INTRAVENOUS
  Filled 2018-10-30: qty 1

## 2018-10-30 MED ORDER — ESMOLOL HCL 100 MG/10ML IV SOLN
INTRAVENOUS | Status: DC | PRN
  Administered 2018-10-30: 20 mg via INTRAVENOUS

## 2018-10-30 MED ORDER — SODIUM CHLORIDE 0.9 % IV SOLN
500.0000 mL | Freq: Once | INTRAVENOUS | Status: AC
  Administered 2018-10-30: 500 mL via INTRAVENOUS

## 2018-10-30 MED ORDER — KETOROLAC TROMETHAMINE 30 MG/ML IJ SOLN
30.0000 mg | Freq: Once | INTRAMUSCULAR | Status: AC
  Administered 2018-10-30: 30 mg via INTRAVENOUS

## 2018-10-30 NOTE — Transfer of Care (Signed)
Immediate Anesthesia Transfer of Care Note  Patient: Melinda [name]  Procedure(s) Performed: ECT TX  Patient Location: PACU  Anesthesia Type:General  Level of Consciousness: drowsy and patient cooperative  Airway & Oxygen Therapy: Patient Spontanous Breathing and Patient connected to face mask oxygen  Post-op Assessment: Report given to RN and Post -op Vital signs reviewed and stable  Post vital signs: Reviewed and stable  Last Vitals:  Vitals Value Taken Time  BP 198/66 10/30/2018 10:27 AM  Temp    Pulse 92 10/30/2018 10:27 AM  Resp 23 10/30/2018 10:27 AM  SpO2 99 % 10/30/2018 10:27 AM  Vitals shown include unvalidated device data.  Last Pain:  Vitals:   10/30/18 0811  PainSc: 0-No pain         Complications: No apparent anesthesia complications

## 2018-10-30 NOTE — Progress Notes (Signed)
Pt very restless

## 2018-10-30 NOTE — Procedures (Signed)
ECT SERVICES Physician's Interval Evaluation & Treatment Note  Patient Identification: Melinda [name] MRN:  [id number] Date of Evaluation:  10/30/2018 TX #: [id number]  MADRS:   MMSE:   P.E. Findings:  Unchanged physical exam  Psychiatric Interval Note:  Mood is stable  Subjective:  Patient is a 47 y.o. female seen for evaluation for Electroconvulsive Therapy. No complaints  Treatment Summary:   []   Right Unilateral             [x]  Bilateral   % Energy : 1.0 ms 35%   Impedance: 780 ohms  Seizure Energy Index: 7570 V squared  Postictal Suppression Index: 94%  Seizure Concordance Index: 97%  Medications  Pre Shock: Robinul 0.4 mg Toradol 30 mg labetalol 30 mg esmolol 20 mg Brevital 70 mg succinylcholine 100 mg  Post Shock:    Seizure Duration: 45 seconds by EMG 92 seconds by EEG   Comments: Follow-up in 2 weeks  Lungs:  [x]   Clear to auscultation               []  Other:   Heart:    [x]   Regular rhythm             []  irregular rhythm    [x]   Previous H&P reviewed, patient examined and there are NO CHANGES                 []   Previous H&P reviewed, patient examined and there are changes noted.   [name], MD 12/20/201910:12 AM

## 2018-10-30 NOTE — Anesthesia Post-op Follow-up Note (Signed)
Anesthesia QCDR form completed.        

## 2018-10-30 NOTE — H&P (Signed)
Melinda [name] is an 47 y.o. female.   Chief Complaint: Patient has chronic mood symptoms but is pretty stable possibly even a little better than usual HPI: History of recurrent depression  Past Medical History:  Diagnosis Date  . Depression   . Diabetes mellitus without complication ([place])   . Diabetic peripheral neuropathy ([place]) 03/07/15  . Diabetic peripheral neuropathy ([place]) 03/07/15  . Diabetic peripheral neuropathy ([place]) 03/07/15  . GERD (gastroesophageal reflux disease)   . Hypercholesterolemia 03/07/15  . Hypertension   . Obesity 03/07/15  . Personality disorder ([place]) 03/07/15  . Sinus tachycardia 03/07/15   history of  . Suicidal thoughts 03/07/15    Past Surgical History:  Procedure Laterality Date  . electroconvulsion therapy  03/07/15    Family History  Problem Relation Age of Onset  . Hypertension Father   . Diabetes Mother    Social History:  reports that she has never smoked. She has never used smokeless tobacco. She reports that she does not drink alcohol or use drugs.  Allergies:  Allergies  Allergen Reactions  . Prednisone     Increases blood sugar    (Not in a hospital admission)   Results for orders placed or performed during the hospital encounter of 10/30/18 (from the past 48 hour(s))  Glucose, capillary     Status: None   Collection Time: 10/30/18  8:17 AM  Result Value Ref Range   Glucose-Capillary 94 70 - 99 mg/dL   No results found.  Review of Systems  Constitutional: Negative.   HENT: Negative.   Eyes: Negative.   Respiratory: Negative.   Cardiovascular: Negative.   Gastrointestinal: Negative.   Musculoskeletal: Negative.   Skin: Negative.   Neurological: Negative.   Psychiatric/Behavioral: Negative.     Blood pressure (!) 152/91, pulse 79, temperature 97.7 F (36.5 C), resp. rate 18, height 5\' 4"  (1.626 m), weight 120.2 kg, SpO2 100 %. Physical Exam  Nursing note and vitals reviewed. Constitutional: She appears well-developed and  well-nourished.  HENT:  Head: Normocephalic and atraumatic.  Eyes: Pupils are equal, round, and reactive to light. Conjunctivae are normal.  Neck: Normal range of motion.  Cardiovascular: Regular rhythm and normal heart sounds.  Respiratory: Effort normal. No respiratory distress.  GI: Soft.  Musculoskeletal: Normal range of motion.  Neurological: She is alert.  Skin: Skin is warm and dry.  Psychiatric: She has a normal mood and affect. Her behavior is normal. Judgment and thought content normal.     Assessment/Plan Treatment today supportive counseling and treatment.  Follow-up 2 weeks  [name], MD 10/30/2018, 10:10 AM

## 2018-10-30 NOTE — Anesthesia Postprocedure Evaluation (Signed)
Anesthesia Post Note  Patient: Melinda [name]  Procedure(s) Performed: ECT TX  Patient location during evaluation: PACU Anesthesia Type: General Level of consciousness: awake and alert Pain management: pain level controlled Vital Signs Assessment: post-procedure vital signs reviewed and stable Respiratory status: spontaneous breathing, nonlabored ventilation, respiratory function stable and patient connected to nasal cannula oxygen Cardiovascular status: blood pressure returned to baseline and stable Postop Assessment: no apparent nausea or vomiting Anesthetic complications: no     Last Vitals:  Vitals:   10/30/18 1055 10/30/18 1101  BP: 94/76 126/81  Pulse: 78 73  Resp: 19 16  Temp:  36.7 C  SpO2: 97%     Last Pain:  Vitals:   10/30/18 1101  TempSrc: Oral  PainSc: 0-No pain                 Bettymae Yott S

## 2018-10-30 NOTE — Discharge Instructions (Addendum)
1)  The drugs that you have been given will stay in your system until tomorrow so for the       next 24 hours you should not:  A. Drive an automobile  B. Make any legal decisions  C. Drink any alcoholic beverages  2)  You may resume your regular meals upon return home.  3)  A responsible adult must take you home.  Someone should stay with you for a few          hours, then be available by phone for the remainder of the treatment day.  4)  You May experience any of the following symptoms:  Headache, Nausea and a dry mouth (due to the medications you were given),  temporary memory loss and some confusion, or sore muscles (a warm bath  should help this).  If you you experience any of these symptoms let us know on                your return visit.  5)  Report any of the following: any acute discomfort, severe headache, or temperature        greater than 100.5 F.   Also report any unusual redness, swelling, drainage, or pain         at your IV site.    You may report Symptoms to:  [place] at [facility]          Phone: [phone], ECT Department           or Dr. Prescott Gum office [phone]  6)  Your next ECT Treatment is Friday January 3  We will call 2 days prior to your scheduled appointment for arrival times.  7)  Nothing to eat or drink after midnight the night before your procedure.  8)  Take Lisinopril   With a sip of water the morning of your procedure.  9)  Other Instructions: Call [phone] to cancel the morning of your procedure due         to illness or emergency.  10) We will call within 72 hours to assess how you are feeling.

## 2018-10-30 NOTE — Anesthesia Preprocedure Evaluation (Signed)
Anesthesia Evaluation  Patient identified by MRN, date of birth, ID band Patient awake    Reviewed: Allergy & Precautions, NPO status , Patient's Chart, lab work & pertinent test results, reviewed documented beta blocker date and time   Airway Mallampati: III  TM Distance: >3 FB     Dental  (+) Chipped   Pulmonary sleep apnea ,           Cardiovascular hypertension, Pt. on medications      Neuro/Psych PSYCHIATRIC DISORDERS Depression Bipolar Disorder  Neuromuscular disease    GI/Hepatic GERD  Controlled,  Endo/Other  diabetes, Type 2  Renal/GU      Musculoskeletal   Abdominal   Peds  Hematology   Anesthesia Other Findings Obese.  Reproductive/Obstetrics                             Anesthesia Physical Anesthesia Plan  ASA: III  Anesthesia Plan: General   Post-op Pain Management:    Induction: Intravenous  PONV Risk Score and Plan:   Airway Management Planned:   Additional Equipment:   Intra-op Plan:   Post-operative Plan:   Informed Consent: I have reviewed the patients History and Physical, chart, labs and discussed the procedure including the risks, benefits and alternatives for the proposed anesthesia with the patient or authorized representative who has indicated his/her understanding and acceptance.     Plan Discussed with: CRNA  Anesthesia Plan Comments:         Anesthesia Quick Evaluation

## 2018-11-09 ENCOUNTER — Telehealth: Payer: Self-pay

## 2018-11-12 ENCOUNTER — Encounter: Payer: Self-pay | Admitting: Physician Assistant

## 2018-11-12 ENCOUNTER — Other Ambulatory Visit: Payer: Self-pay | Admitting: Psychiatry

## 2018-11-12 ENCOUNTER — Ambulatory Visit (INDEPENDENT_AMBULATORY_CARE_PROVIDER_SITE_OTHER): Payer: Medicare PPO | Admitting: Physician Assistant

## 2018-11-12 ENCOUNTER — Other Ambulatory Visit (HOSPITAL_COMMUNITY)
Admission: RE | Admit: 2018-11-12 | Discharge: 2018-11-12 | Disposition: A | Discharge status: Home / Self Care | Payer: Medicare PPO | Source: Ambulatory Visit | Attending: Physician Assistant | Admitting: Physician Assistant

## 2018-11-12 VITALS — BP 143/90 | HR 105 | Temp 97.8°F | Wt 261.2 lb

## 2018-11-12 DIAGNOSIS — Z124 Encounter for screening for malignant neoplasm of cervix: Secondary | ICD-10-CM

## 2018-11-12 DIAGNOSIS — N898 Other specified noninflammatory disorders of vagina: Secondary | ICD-10-CM

## 2018-11-12 DIAGNOSIS — E785 Hyperlipidemia, unspecified: Secondary | ICD-10-CM

## 2018-11-12 DIAGNOSIS — Z1211 Encounter for screening for malignant neoplasm of colon: Secondary | ICD-10-CM | POA: Diagnosis not present

## 2018-11-12 DIAGNOSIS — R748 Abnormal levels of other serum enzymes: Secondary | ICD-10-CM

## 2018-11-12 DIAGNOSIS — E1159 Type 2 diabetes mellitus with other circulatory complications: Secondary | ICD-10-CM

## 2018-11-12 DIAGNOSIS — Z1239 Encounter for other screening for malignant neoplasm of breast: Secondary | ICD-10-CM | POA: Diagnosis not present

## 2018-11-12 DIAGNOSIS — E1169 Type 2 diabetes mellitus with other specified complication: Secondary | ICD-10-CM

## 2018-11-12 DIAGNOSIS — I10 Essential (primary) hypertension: Secondary | ICD-10-CM | POA: Diagnosis not present

## 2018-11-12 DIAGNOSIS — E119 Type 2 diabetes mellitus without complications: Secondary | ICD-10-CM

## 2018-11-12 DIAGNOSIS — Z0000 Encounter for general adult medical examination without abnormal findings: Secondary | ICD-10-CM | POA: Diagnosis not present

## 2018-11-12 NOTE — Progress Notes (Signed)
Patient: Melinda [name], Female    DOB: Mar 11, 1971, 48 y.o.   MRN: [id number] Visit Date: 11/20/2018  Today's Provider: Trinna Post, PA-C   Chief Complaint  Patient presents with  . Annual Exam   Subjective:    Annual physical exam Melinda [name] is a 48 y.o. female who presents today for health maintenance and complete physical. She feels fairly well. She reports exercising none. She reports she is sleeping fairly well.  PAP Smear: Due, hasn't had in many years Colonoscopy: Will turn 13 in February, due for colon cancer screening.  DM II: Due for eye exam. Due for foot exam. She takes actos 30 mg daily. Continues to take this daily, denies issues.  Lab Results  Component Value Date   HGBA1C 6.9 (A) 10/06/2018   HTN: She was started on 10 mg Lisinopril daily at Last visit. She has not taken her medications this morning, she says she is used to not taking any medications the day of her ECT sessions.   BP Readings from Last 8 Encounters:  11/12/18 (!) 143/90  10/06/18 140/85  06/14/15 116/82  12/16/13 118/72     HLD: Takes lipitor 10 mg daily. Denies issues.  Lipid Panel     Component Value Date/Time   CHOL 160 10/06/2018 1035   TRIG 96 10/06/2018 1035   HDL 54 10/06/2018 1035   CHOLHDL 3.0 10/06/2018 1035   LDLCALC 87 10/06/2018 1035    Continues ECT sessions every two weeks with Dr. Weber Cooks. -----------------------------------------------------------------   Review of Systems  Constitutional: Negative.   HENT: Positive for dental problem and ear pain.   Eyes: Positive for itching.  Respiratory: Negative.   Cardiovascular: Negative.   Gastrointestinal: Positive for diarrhea.  Endocrine: Negative.   Genitourinary: Positive for vaginal discharge.  Musculoskeletal: Negative.   Skin: Negative.   Allergic/Immunologic: Negative.   Neurological: Negative.   Hematological: Negative.   Psychiatric/Behavioral: Negative.     Social History She   reports that she has never smoked. She has never used smokeless tobacco. She reports that she does not drink alcohol or use drugs. Social History   Socioeconomic History  . Marital status: Single    Spouse name: Not on file  . Number of children: 1  . Years of education: Not on file  . Highest education level: Not on file  Occupational History  . Occupation: Disabled  Social Needs  . Financial resource strain: Not on file  . Food insecurity:    Worry: Not on file    Inability: Not on file  . Transportation needs:    Medical: Not on file    Non-medical: Not on file  Tobacco Use  . Smoking status: Never Smoker  . Smokeless tobacco: Never Used  Substance and Sexual Activity  . Alcohol use: No  . Drug use: No  . Sexual activity: Not Currently    Birth control/protection: Abstinence  Lifestyle  . Physical activity:    Days per week: Not on file    Minutes per session: Not on file  . Stress: Not on file  Relationships  . Social connections:    Talks on phone: Not on file    Gets together: Not on file    Attends religious service: Not on file    Active member of club or organization: Not on file    Attends meetings of clubs or organizations: Not on file    Relationship status: Not on file  Other  Topics Concern  . Not on file  Social History Narrative  . Not on file    Patient Active Problem List   Diagnosis Date Noted  . Affective bipolar disorder ([place]) 04/28/2015  . Cardiomyopathy ([place]) 04/28/2015  . Diabetes mellitus, type 2 ([place]) 04/28/2015  . Hypercholesteremia 04/28/2015  . BP (high blood pressure) 04/28/2015  . Morbid obesity ([place]) 04/28/2015  . Disorder of peripheral nervous system 04/28/2015  . Severe depression ([place]) 04/28/2015  . Apnea, sleep 04/28/2015  . Attempted suicide ([place]) 04/28/2015  . Severe recurrent major depression without psychotic features ([name])   . Diabetes mellitus type II, controlled ([place]) 09/09/2007  . DEPRESSION 09/09/2007  .  PERIPHERAL NEUROPATHY 09/09/2007  . HYPERTENSION 09/09/2007  . GERD 09/09/2007    Past Surgical History:  Procedure Laterality Date  . electroconvulsion therapy  03/07/15    Family History  Family Status  Relation Name Status  . Father Donnetta Simpers ( [place]) Alive  . Mother [employer]  . Sister [employer]  . Sister  Alive  . Sister  Alive   Her family history includes Diabetes in her mother; Hypertension in her father.     Allergies  Allergen Reactions  . Prednisone     Increases blood sugar    Previous Medications   AMOXICILLIN (AMOXIL) 500 MG CAPSULE    Take 500 mg by mouth 3 (three) times daily. Take 1 capsule by mouth three times daily for 10 days.   ATORVASTATIN (LIPITOR) 10 MG TABLET    Take 10 mg by mouth at bedtime.   BUPROPION (WELLBUTRIN XL) 300 MG 24 HR TABLET    TAKE 1 TABLET EVERY DAY   ESCITALOPRAM (LEXAPRO) 20 MG TABLET    TAKE 1 TABLET (20 MG TOTAL) BY MOUTH DAILY.   LANCETS MISC. (ACCU-CHEK FASTCLIX LANCET) KIT       LISINOPRIL (PRINIVIL,ZESTRIL) 10 MG TABLET    Take 1 tablet (10 mg total) by mouth daily.   PIOGLITAZONE (ACTOS) 30 MG TABLET    Take 1 tablet (30 mg total) by mouth daily.   ZIPRASIDONE (GEODON) 80 MG CAPSULE    TAKE 1 CAPSULE TWO TIMES DAILY WITH A MEAL.    Patient Care Team: Paulene Floor as PCP - General (Physician Assistant)      Objective:   Vitals: BP (!) 143/90 (BP Location: Left Arm, Patient Position: Sitting, Cuff Size: Large)   Pulse (!) 105   Temp 97.8 F (36.6 C) (Oral)   Wt 261 lb 3.2 oz (118.5 kg)   SpO2 97%   BMI 44.83 kg/m    Physical Exam Exam conducted with a chaperone present.  Constitutional:      Appearance: She is obese.  HENT:     Right Ear: Tympanic membrane and ear canal normal.     Left Ear: Tympanic membrane and ear canal normal.     Mouth/Throat:     Mouth: Mucous membranes are moist.     Pharynx: Oropharynx is clear.  Neck:     Musculoskeletal: Neck supple.  Cardiovascular:     Rate and  Rhythm: Normal rate and regular rhythm.     Heart sounds: Normal heart sounds.  Pulmonary:     Effort: Pulmonary effort is normal.     Breath sounds: Normal breath sounds.  Abdominal:     General: Abdomen is flat. Bowel sounds are normal.     Palpations: Abdomen is soft.  Genitourinary:    Exam position: Lithotomy position.  Labia:        Right: No rash.        Left: No rash.      Vagina: Normal.     Cervix: Normal.  Skin:    General: Skin is warm and dry.  Neurological:     Mental Status: She is alert and oriented to person, place, and time.  Psychiatric:        Mood and Affect: Mood normal.        Behavior: Behavior normal.      Depression Screen PHQ 2/9 Scores 11/12/2018 10/06/2018 06/14/2015  PHQ - 2 Score 3 4 -  PHQ- 9 Score 8 15 -  Exception Documentation - - Other- indicate reason in comment box  Not completed - - Pt has severe depression; has weekly ECT Treatments, and being followed very closely by a psychiatry.      Assessment & Plan:     Routine Health Maintenance and Physical Exam  Exercise Activities and Dietary recommendations Goals   None     Immunization History  Administered Date(s) Administered  . Hepatitis B 12/16/2013  . Influenza,inj,Quad PF,6+ Mos 07/19/2013, 10/06/2018  . Pneumococcal Polysaccharide-23 12/16/2013    Health Maintenance  Topic Date Due  . TETANUS/TDAP  01/02/1988  . MAMMOGRAM  07/09/2017  . HEMOGLOBIN A1C  04/06/2019  . FOOT EXAM  10/07/2019  . OPHTHALMOLOGY EXAM  11/17/2019  . PAP SMEAR-Modifier  11/12/2021  . INFLUENZA VACCINE  Completed  . PNEUMOCOCCAL POLYSACCHARIDE VACCINE AGE 79-64 HIGH RISK  Completed  . HIV Screening  Completed     Discussed health benefits of physical activity, and encouraged her to engage in regular exercise appropriate for her age and condition.    1. Annual physical exam   2. Cervical cancer screening  - Cytology - PAP  3. Vaginal discharge  - Cytology - PAP  4.  Hypertension associated with diabetes ([place])  Has not taken her BP medications today, will continue at 10 mg Lisinopril daily. - Comprehensive metabolic panel  5. Colon cancer screening  - Ambulatory referral to Gastroenterology  6. Breast cancer screening  - MM 3D SCREEN BREAST BILATERAL; Future  7. Morbid obesity ([place])   8. Controlled type 2 diabetes mellitus without complication, without long-term current use of insulin ([facility])  Continue actos 30 mg daily.  9. Hyperlipidemia associated with type 2 diabetes mellitus ([facility])  Continue 10 mg lipitor daily.  Return in about 3 months (around 02/11/2019) for HTN, HLD, DM.  The entirety of the information documented in the History of Present Illness, Review of Systems and Physical Exam were personally obtained by me. Portions of this information were initially documented by Hurman Horn, CMA and reviewed by me for thoroughness and accuracy.       --------------------------------------------------------------------

## 2018-11-13 ENCOUNTER — Encounter
Admission: RE | Admit: 2018-11-13 | Discharge: 2018-11-13 | Disposition: A | Discharge status: Home / Self Care | Payer: Medicare PPO | Source: Ambulatory Visit | Attending: Psychiatry | Admitting: Psychiatry

## 2018-11-13 ENCOUNTER — Encounter: Payer: Self-pay | Admitting: Anesthesiology

## 2018-11-13 DIAGNOSIS — I10 Essential (primary) hypertension: Secondary | ICD-10-CM | POA: Diagnosis not present

## 2018-11-13 DIAGNOSIS — E1140 Type 2 diabetes mellitus with diabetic neuropathy, unspecified: Secondary | ICD-10-CM | POA: Diagnosis not present

## 2018-11-13 DIAGNOSIS — Z888 Allergy status to other drugs, medicaments and biological substances status: Secondary | ICD-10-CM | POA: Insufficient documentation

## 2018-11-13 DIAGNOSIS — K219 Gastro-esophageal reflux disease without esophagitis: Secondary | ICD-10-CM | POA: Diagnosis not present

## 2018-11-13 DIAGNOSIS — E1142 Type 2 diabetes mellitus with diabetic polyneuropathy: Secondary | ICD-10-CM | POA: Diagnosis not present

## 2018-11-13 DIAGNOSIS — F329 Major depressive disorder, single episode, unspecified: Secondary | ICD-10-CM | POA: Diagnosis not present

## 2018-11-13 DIAGNOSIS — E7800 Pure hypercholesterolemia, unspecified: Secondary | ICD-10-CM | POA: Diagnosis not present

## 2018-11-13 DIAGNOSIS — G4730 Sleep apnea, unspecified: Secondary | ICD-10-CM | POA: Diagnosis not present

## 2018-11-13 DIAGNOSIS — F332 Major depressive disorder, recurrent severe without psychotic features: Secondary | ICD-10-CM | POA: Diagnosis not present

## 2018-11-13 LAB — COMPREHENSIVE METABOLIC PANEL
ALT: 13 IU/L (ref 0–32)
AST: 16 IU/L (ref 0–40)
Albumin/Globulin Ratio: 1.4 (ref 1.2–2.2)
Albumin: 4.1 g/dL (ref 3.5–5.5)
Alkaline Phosphatase: 171 IU/L — ABNORMAL HIGH (ref 39–117)
BUN/Creatinine Ratio: 14 (ref 9–23)
BUN: 11 mg/dL (ref 6–24)
Bilirubin Total: 0.3 mg/dL (ref 0.0–1.2)
CO2: 20 mmol/L (ref 20–29)
Calcium: 9.9 mg/dL (ref 8.7–10.2)
Chloride: 102 mmol/L (ref 96–106)
Creatinine, Ser: 0.78 mg/dL (ref 0.57–1.00)
GFR calc Af Amer: 103 mL/min/{1.73_m2} (ref >59)
GFR calc non Af Amer: 90 mL/min/{1.73_m2} (ref >59)
Globulin, Total: 3 g/dL (ref 1.5–4.5)
Glucose: 164 mg/dL — ABNORMAL HIGH (ref 65–99)
Potassium: 4.2 mmol/L (ref 3.5–5.2)
Sodium: 139 mmol/L (ref 134–144)
Total Protein: 7.1 g/dL (ref 6.0–8.5)

## 2018-11-13 LAB — POCT PREGNANCY, URINE: Preg Test, Ur: NEGATIVE

## 2018-11-13 LAB — GLUCOSE, CAPILLARY: Glucose-Capillary: 104 mg/dL — ABNORMAL HIGH (ref 70–99)

## 2018-11-13 MED ORDER — METHOHEXITAL SODIUM 0.5 G IJ SOLR
INTRAMUSCULAR | Status: AC
  Filled 2018-11-13: qty 500

## 2018-11-13 MED ORDER — METHOHEXITAL SODIUM 100 MG/10ML IV SOSY
PREFILLED_SYRINGE | INTRAVENOUS | Status: DC | PRN
  Administered 2018-11-13: 70 mg via INTRAVENOUS

## 2018-11-13 MED ORDER — GLYCOPYRROLATE 0.2 MG/ML IJ SOLN
0.4000 mg | Freq: Once | INTRAMUSCULAR | Status: AC
  Administered 2018-11-13: 0.4 mg via INTRAVENOUS

## 2018-11-13 MED ORDER — LABETALOL HCL 5 MG/ML IV SOLN
INTRAVENOUS | Status: DC | PRN
  Administered 2018-11-13: 30 mg via INTRAVENOUS

## 2018-11-13 MED ORDER — ESMOLOL HCL 100 MG/10ML IV SOLN
INTRAVENOUS | Status: AC
  Filled 2018-11-13: qty 10

## 2018-11-13 MED ORDER — LABETALOL HCL 5 MG/ML IV SOLN
INTRAVENOUS | Status: AC
  Filled 2018-11-13: qty 8

## 2018-11-13 MED ORDER — SUCCINYLCHOLINE CHLORIDE 20 MG/ML IJ SOLN
INTRAMUSCULAR | Status: AC
  Filled 2018-11-13: qty 2

## 2018-11-13 MED ORDER — SUCCINYLCHOLINE CHLORIDE 20 MG/ML IJ SOLN
INTRAMUSCULAR | Status: DC | PRN
  Administered 2018-11-13: 100 mg via INTRAVENOUS

## 2018-11-13 MED ORDER — ESMOLOL HCL 100 MG/10ML IV SOLN
INTRAVENOUS | Status: DC | PRN
  Administered 2018-11-13: 20 mg via INTRAVENOUS

## 2018-11-13 MED ORDER — KETOROLAC TROMETHAMINE 30 MG/ML IJ SOLN
30.0000 mg | Freq: Once | INTRAMUSCULAR | Status: AC
  Administered 2018-11-13: 30 mg via INTRAVENOUS

## 2018-11-13 MED ORDER — GLYCOPYRROLATE 0.2 MG/ML IJ SOLN
INTRAMUSCULAR | Status: AC
  Administered 2018-11-13: 0.4 mg via INTRAVENOUS
  Filled 2018-11-13: qty 2

## 2018-11-13 MED ORDER — KETOROLAC TROMETHAMINE 30 MG/ML IJ SOLN
INTRAMUSCULAR | Status: AC
  Administered 2018-11-13: 30 mg via INTRAVENOUS
  Filled 2018-11-13: qty 1

## 2018-11-13 MED ORDER — SODIUM CHLORIDE 0.9 % IV SOLN
500.0000 mL | Freq: Once | INTRAVENOUS | Status: AC
  Administered 2018-11-13: 10:00:00 via INTRAVENOUS

## 2018-11-13 NOTE — Procedures (Signed)
ECT SERVICES Physician's Interval Evaluation & Treatment Note  Patient Identification: Melinda [name] MRN:  [id number] Date of Evaluation:  11/13/2018 TX #: [id number]  MADRS: 20  MMSE: 30  P.E. Findings:  No change to physical exam  Psychiatric Interval Note:  Mood chronically depressed but stable  Subjective:  Patient is a 48 y.o. female seen for evaluation for Electroconvulsive Therapy. Feels stable no psychosis  Treatment Summary:   []   Right Unilateral             [x]  Bilateral   % Energy : 1.0 ms 35%   Impedance: 1440 ohms  Seizure Energy Index: 4106 V squared  Postictal Suppression Index: 67%  Seizure Concordance Index: 83%  Medications  Pre Shock: Robinul 0.4 mg Toradol 30 mg labetalol 20 mg esmolol 20 mg Brevital 70 mg succinylcholine 100 mg  Post Shock:    Seizure Duration: 34 seconds by foot movement 78 seconds by EEG   Comments: Follow-up 2 weeks  Lungs:  [x]   Clear to auscultation               []  Other:   Heart:    [x]   Regular rhythm             []  irregular rhythm    [x]   Previous H&P reviewed, patient examined and there are NO CHANGES                 []   Previous H&P reviewed, patient examined and there are changes noted.   [name], MD 1/3/202010:22 AM

## 2018-11-13 NOTE — Anesthesia Post-op Follow-up Note (Signed)
Anesthesia QCDR form completed.        

## 2018-11-13 NOTE — Discharge Instructions (Signed)
1)  The drugs that you have been given will stay in your system until tomorrow so for the       next 24 hours you should not:  A. Drive an automobile  B. Make any legal decisions  C. Drink any alcoholic beverages  2)  You may resume your regular meals upon return home.  3)  A responsible adult must take you home.  Someone should stay with you for a few          hours, then be available by phone for the remainder of the treatment day.  4)  You May experience any of the following symptoms:  Headache, Nausea and a dry mouth (due to the medications you were given),  temporary memory loss and some confusion, or sore muscles (a warm bath  should help this).  If you you experience any of these symptoms let us know on                your return visit.  5)  Report any of the following: any acute discomfort, severe headache, or temperature        greater than 100.5 F.   Also report any unusual redness, swelling, drainage, or pain         at your IV site.    You may report Symptoms to:  [place] at [facility]          Phone: [phone], ECT Department           or Dr. Prescott Gum office [phone]  6)  Your next ECT Treatment is Friday January 17 at 8:00  We will call 2 days prior to your scheduled appointment for arrival times.  7)  Nothing to eat or drink after midnight the night before your procedure.  8)  Take LISINOPRIL    With a sip of water the morning of your procedure.  9)  Other Instructions: Call [phone] to cancel the morning of your procedure due         to illness or emergency.  10) We will call within 72 hours to assess how you are feeling.

## 2018-11-13 NOTE — Anesthesia Postprocedure Evaluation (Signed)
Anesthesia Post Note  Patient: Melinda [name]  Procedure(s) Performed: ECT TX  Patient location during evaluation: PACU Anesthesia Type: General Level of consciousness: awake and alert Pain management: pain level controlled Vital Signs Assessment: post-procedure vital signs reviewed and stable Respiratory status: spontaneous breathing, nonlabored ventilation and respiratory function stable Cardiovascular status: blood pressure returned to baseline and stable Postop Assessment: no signs of nausea or vomiting Anesthetic complications: no     Last Vitals:  Vitals:   11/13/18 1046 11/13/18 1057  BP: 120/72 (!) 145/93  Pulse: 89 75  Resp: 20 16  Temp:  36.9 C  SpO2: 100%     Last Pain:  Vitals:   11/13/18 1057  TempSrc: Oral  PainSc: 0-No pain                 Rajiv Parlato

## 2018-11-13 NOTE — H&P (Signed)
Melinda [name] is an 48 y.o. female.   Chief Complaint: Chronic depression with chronic negative thinking and irritability.  Benefits from intermediate in ECT.  No specific new complaint HPI: No specific new complaint has chronic depression and receives maintenance ECT  Past Medical History:  Diagnosis Date  . Depression   . Diabetes mellitus without complication ([place])   . Diabetic peripheral neuropathy ([place]) 03/07/15  . Diabetic peripheral neuropathy ([place]) 03/07/15  . Diabetic peripheral neuropathy ([place]) 03/07/15  . GERD (gastroesophageal reflux disease)   . Hypercholesterolemia 03/07/15  . Hypertension   . Obesity 03/07/15  . Personality disorder ([place]) 03/07/15  . Sinus tachycardia 03/07/15   history of  . Suicidal thoughts 03/07/15    Past Surgical History:  Procedure Laterality Date  . electroconvulsion therapy  03/07/15    Family History  Problem Relation Age of Onset  . Hypertension Father   . Diabetes Mother    Social History:  reports that she has never smoked. She has never used smokeless tobacco. She reports that she does not drink alcohol or use drugs.  Allergies:  Allergies  Allergen Reactions  . Prednisone     Increases blood sugar    (Not in a hospital admission)   Results for orders placed or performed during the hospital encounter of 11/13/18 (from the past 48 hour(s))  Pregnancy, urine POC     Status: None   Collection Time: 11/13/18  8:27 AM  Result Value Ref Range   Preg Test, Ur NEGATIVE NEGATIVE    Comment:        THE SENSITIVITY OF THIS METHODOLOGY IS >24 mIU/mL   Glucose, capillary     Status: Abnormal   Collection Time: 11/13/18  8:33 AM  Result Value Ref Range   Glucose-Capillary 104 (H) 70 - 99 mg/dL   No results found.  Review of Systems  Constitutional: Negative.   HENT: Negative.   Eyes: Negative.   Respiratory: Negative.   Cardiovascular: Negative.   Gastrointestinal: Negative.   Musculoskeletal: Negative.   Skin: Negative.    Neurological: Negative.   Psychiatric/Behavioral: Positive for depression. Negative for hallucinations, memory loss, substance abuse and suicidal ideas. The patient is not nervous/anxious and does not have insomnia.     Blood pressure (!) 147/96, pulse (!) 115, temperature (!) 97.2 F (36.2 C), temperature source Oral, resp. rate 18, SpO2 98 %. Physical Exam  Nursing note and vitals reviewed. Constitutional: She appears well-developed and well-nourished.  HENT:  Head: Normocephalic and atraumatic.  Eyes: Pupils are equal, round, and reactive to light. Conjunctivae are normal.  Neck: Normal range of motion.  Cardiovascular: Regular rhythm and normal heart sounds.  Respiratory: Effort normal.  GI: Soft.  Musculoskeletal: Normal range of motion.  Neurological: She is alert.  Skin: Skin is warm and dry.  Psychiatric: She has a normal mood and affect. Her behavior is normal. Judgment and thought content normal.     Assessment/Plan Treatment today and follow-up in 2 weeks  [name], MD 11/13/2018, 9:55 AM

## 2018-11-13 NOTE — Anesthesia Preprocedure Evaluation (Signed)
Anesthesia Evaluation  Patient identified by MRN, date of birth, ID band Patient awake    Reviewed: Allergy & Precautions, H&P , NPO status , Patient's Chart, lab work & pertinent test results  History of Anesthesia Complications Negative for: history of anesthetic complications  Airway Mallampati: II  TM Distance: >3 FB Neck ROM: full    Dental  (+) Poor Dentition, Chipped   Pulmonary sleep apnea , neg COPD,    Pulmonary exam normal breath sounds clear to auscultation       Cardiovascular hypertension, Pt. on medications (-) CAD and (-) Past MI negative cardio ROS Normal cardiovascular exam Rhythm:regular Rate:Normal     Neuro/Psych PSYCHIATRIC DISORDERS Depression Bipolar Disorder  Neuromuscular disease negative neurological ROS     GI/Hepatic negative GI ROS, Neg liver ROS, GERD  Medicated,  Endo/Other  diabetes, Type 2, Oral Hypoglycemic Agents  Renal/GU negative Renal ROS  negative genitourinary   Musculoskeletal   Abdominal (+) + obese,   Peds  Hematology negative hematology ROS (+)   Anesthesia Other Findings Past Medical History: No date: Depression No date: Diabetes mellitus without complication ([facility]) 03/07/15: Diabetic peripheral neuropathy ([facility]) 03/07/15: Diabetic peripheral neuropathy ([facility]) 03/07/15: Diabetic peripheral neuropathy ([facility]) No date: GERD (gastroesophageal reflux disease) 03/07/15: Hypercholesterolemia No date: Hypertension 03/07/15: Obesity 03/07/15: Personality disorder 03/07/15: Sinus tachycardia ([facility])     Comment: history of 03/07/15: Suicidal thoughts Past Surgical History: 03/07/15: electroconvulsion therapy BMI    Body Mass Index:  36.44 kg/m     Reproductive/Obstetrics                             Anesthesia Physical  Anesthesia Plan  ASA: III  Anesthesia Plan: General   Post-op Pain Management:    Induction: Intravenous  PONV Risk Score and  Plan: 2 and Ondansetron  Airway Management Planned: Mask  Additional Equipment:   Intra-op Plan:   Post-operative Plan:   Informed Consent: I have reviewed the patients History and Physical, chart, labs and discussed the procedure including the risks, benefits and alternatives for the proposed anesthesia with the patient or authorized representative who has indicated his/her understanding and acceptance.     Dental Advisory Given  Plan Discussed with: CRNA and Anesthesiologist  Anesthesia Plan Comments:         Anesthesia Quick Evaluation  

## 2018-11-13 NOTE — Transfer of Care (Signed)
Immediate Anesthesia Transfer of Care Note  Patient: Melinda [name]  Procedure(s) Performed: ECT TX  Patient Location: PACU  Anesthesia Type:General  Level of Consciousness: awake, alert  and oriented  Airway & Oxygen Therapy: Patient Spontanous Breathing  Post-op Assessment: Report given to RN and Post -op Vital signs reviewed and stable  Post vital signs: Reviewed and stable  Last Vitals:  Vitals Value Taken Time  BP    Temp    Pulse 96 11/13/2018 10:26 AM  Resp 17 11/13/2018 10:26 AM  SpO2 94 % 11/13/2018 10:26 AM  Vitals shown include unvalidated device data.  Last Pain:  Vitals:   11/13/18 0814  TempSrc: Oral  PainSc: 0-No pain         Complications: No apparent anesthesia complications

## 2018-11-16 DIAGNOSIS — E119 Type 2 diabetes mellitus without complications: Secondary | ICD-10-CM | POA: Diagnosis not present

## 2018-11-16 LAB — HM DIABETES EYE EXAM

## 2018-11-17 LAB — CYTOLOGY - PAP
Bacterial vaginitis: NEGATIVE
Candida vaginitis: POSITIVE — AB
Chlamydia: NEGATIVE
Diagnosis: NEGATIVE
HPV: NOT DETECTED
Neisseria Gonorrhea: NEGATIVE
Trichomonas: NEGATIVE

## 2018-11-18 ENCOUNTER — Other Ambulatory Visit: Payer: Self-pay

## 2018-11-18 DIAGNOSIS — Z1211 Encounter for screening for malignant neoplasm of colon: Secondary | ICD-10-CM

## 2018-11-19 ENCOUNTER — Telehealth: Payer: Self-pay

## 2018-11-19 DIAGNOSIS — B3731 Acute candidiasis of vulva and vagina: Secondary | ICD-10-CM

## 2018-11-19 DIAGNOSIS — B373 Candidiasis of vulva and vagina: Secondary | ICD-10-CM

## 2018-11-19 MED ORDER — TERCONAZOLE 0.4 % VA CREA: 1.0000 | TOPICAL_CREAM | Freq: Every day | VAGINAL | 0 refills | Status: AC

## 2018-11-19 NOTE — Telephone Encounter (Signed)
Forgive me, diflucan interacts with Geodon and I will have to send her terconazole vaginal cream. One applicator before bed nightly for seven days. I sent it into [facility] on [address].

## 2018-11-19 NOTE — Telephone Encounter (Signed)
Called [facility] to add GGT, for Dx: elevated alkaline phosphatase. LMTCB

## 2018-11-19 NOTE — Telephone Encounter (Signed)
-----   Message from Trinna Post, [place] sent at 11/18/2018  4:52 PM EST ----- Can we add on a GGT to this labwork? Gamma glutamyl transpeptidase. PAP smear normal with negative HPV but does show candida. Can send in diflucan for patient.

## 2018-11-19 NOTE — Telephone Encounter (Signed)
Pt advised.  Please send rx to [facility].   Thanks,   -Mickel Baas

## 2018-11-24 LAB — GAMMA GT: GGT: 16 IU/L (ref 0–60)

## 2018-11-25 ENCOUNTER — Telehealth: Payer: Self-pay

## 2018-11-26 ENCOUNTER — Other Ambulatory Visit: Payer: Self-pay | Admitting: Psychiatry

## 2018-11-27 ENCOUNTER — Encounter (HOSPITAL_COMMUNITY)
Admission: RE | Admit: 2018-11-27 | Discharge: 2018-11-27 | Disposition: A | Discharge status: Home / Self Care | Payer: Medicare PPO | Source: Ambulatory Visit | Attending: Psychiatry | Admitting: Psychiatry

## 2018-11-27 ENCOUNTER — Encounter: Payer: Self-pay | Admitting: Anesthesiology

## 2018-11-27 DIAGNOSIS — E1140 Type 2 diabetes mellitus with diabetic neuropathy, unspecified: Secondary | ICD-10-CM | POA: Diagnosis not present

## 2018-11-27 DIAGNOSIS — I10 Essential (primary) hypertension: Secondary | ICD-10-CM | POA: Diagnosis not present

## 2018-11-27 DIAGNOSIS — E7800 Pure hypercholesterolemia, unspecified: Secondary | ICD-10-CM | POA: Diagnosis not present

## 2018-11-27 DIAGNOSIS — G4730 Sleep apnea, unspecified: Secondary | ICD-10-CM | POA: Diagnosis not present

## 2018-11-27 DIAGNOSIS — Z888 Allergy status to other drugs, medicaments and biological substances status: Secondary | ICD-10-CM | POA: Diagnosis not present

## 2018-11-27 DIAGNOSIS — F332 Major depressive disorder, recurrent severe without psychotic features: Secondary | ICD-10-CM | POA: Diagnosis not present

## 2018-11-27 DIAGNOSIS — F329 Major depressive disorder, single episode, unspecified: Secondary | ICD-10-CM | POA: Diagnosis not present

## 2018-11-27 DIAGNOSIS — F251 Schizoaffective disorder, depressive type: Secondary | ICD-10-CM | POA: Diagnosis not present

## 2018-11-27 DIAGNOSIS — K219 Gastro-esophageal reflux disease without esophagitis: Secondary | ICD-10-CM | POA: Diagnosis not present

## 2018-11-27 DIAGNOSIS — E1142 Type 2 diabetes mellitus with diabetic polyneuropathy: Secondary | ICD-10-CM | POA: Diagnosis not present

## 2018-11-27 LAB — GLUCOSE, CAPILLARY: Glucose-Capillary: 135 mg/dL — ABNORMAL HIGH (ref 70–99)

## 2018-11-27 LAB — POCT PREGNANCY, URINE: Preg Test, Ur: NEGATIVE

## 2018-11-27 MED ORDER — KETOROLAC TROMETHAMINE 30 MG/ML IJ SOLN
INTRAMUSCULAR | Status: AC
  Administered 2018-11-27: 30 mg via INTRAVENOUS
  Filled 2018-11-27: qty 1

## 2018-11-27 MED ORDER — GLYCOPYRROLATE 0.2 MG/ML IJ SOLN
0.4000 mg | Freq: Once | INTRAMUSCULAR | Status: AC
  Administered 2018-11-27: 0.4 mg via INTRAVENOUS

## 2018-11-27 MED ORDER — ESMOLOL HCL 100 MG/10ML IV SOLN
INTRAVENOUS | Status: AC
  Filled 2018-11-27: qty 10

## 2018-11-27 MED ORDER — ONDANSETRON HCL 4 MG/2ML IJ SOLN
INTRAMUSCULAR | Status: AC
  Filled 2018-11-27: qty 2

## 2018-11-27 MED ORDER — SUCCINYLCHOLINE CHLORIDE 20 MG/ML IJ SOLN
INTRAMUSCULAR | Status: DC | PRN
  Administered 2018-11-27: 100 mg via INTRAVENOUS

## 2018-11-27 MED ORDER — GLYCOPYRROLATE 0.2 MG/ML IJ SOLN
INTRAMUSCULAR | Status: AC
  Filled 2018-11-27: qty 2

## 2018-11-27 MED ORDER — METHOHEXITAL SODIUM 100 MG/10ML IV SOSY
PREFILLED_SYRINGE | INTRAVENOUS | Status: DC | PRN
  Administered 2018-11-27: 70 mg via INTRAVENOUS

## 2018-11-27 MED ORDER — SODIUM CHLORIDE 0.9 % IV SOLN
500.0000 mL | Freq: Once | INTRAVENOUS | Status: AC
  Administered 2018-11-27: 500 mL via INTRAVENOUS

## 2018-11-27 MED ORDER — LABETALOL HCL 5 MG/ML IV SOLN
INTRAVENOUS | Status: DC | PRN
  Administered 2018-11-27: 20 mg via INTRAVENOUS
  Administered 2018-11-27: 10 mg via INTRAVENOUS

## 2018-11-27 MED ORDER — LABETALOL HCL 5 MG/ML IV SOLN
INTRAVENOUS | Status: AC
  Filled 2018-11-27: qty 8

## 2018-11-27 MED ORDER — SODIUM CHLORIDE 0.9 % IV SOLN
INTRAVENOUS | Status: DC | PRN
  Administered 2018-11-27: 10:00:00 via INTRAVENOUS

## 2018-11-27 MED ORDER — ESMOLOL HCL 100 MG/10ML IV SOLN
INTRAVENOUS | Status: DC | PRN
  Administered 2018-11-27: 20 mg via INTRAVENOUS

## 2018-11-27 MED ORDER — KETOROLAC TROMETHAMINE 30 MG/ML IJ SOLN
30.0000 mg | Freq: Once | INTRAMUSCULAR | Status: AC
  Administered 2018-11-27: 30 mg via INTRAVENOUS

## 2018-11-27 NOTE — Anesthesia Procedure Notes (Signed)
Date/Time: 11/27/2018 10:30 AM Performed by: Jonna Clark, CRNA Pre-anesthesia Checklist: Patient identified, Emergency Drugs available, Suction available and Patient being monitored Patient Re-evaluated:Patient Re-evaluated prior to induction Oxygen Delivery Method: Circle system utilized Preoxygenation: Pre-oxygenation with 100% oxygen Induction Type: IV induction Ventilation: Mask ventilation without difficulty and Mask ventilation throughout procedure Airway Equipment and Method: Bite block Placement Confirmation: positive ETCO2 Dental Injury: Teeth and Oropharynx as per pre-operative assessment

## 2018-11-27 NOTE — Discharge Instructions (Signed)
1)  The drugs that you have been given will stay in your system until tomorrow so for the       next 24 hours you should not:  A. Drive an automobile  B. Make any legal decisions  C. Drink any alcoholic beverages  2)  You may resume your regular meals upon return home.  3)  A responsible adult must take you home.  Someone should stay with you for a few          hours, then be available by phone for the remainder of the treatment day.  4)  You May experience any of the following symptoms:  Headache, Nausea and a dry mouth (due to the medications you were given),  temporary memory loss and some confusion, or sore muscles (a warm bath  should help this).  If you you experience any of these symptoms let us know on                your return visit.  5)  Report any of the following: any acute discomfort, severe headache, or temperature        greater than 100.5 F.   Also report any unusual redness, swelling, drainage, or pain         at your IV site.    You may report Symptoms to:  [place] [facility]          Phone: [phone], ECT Department           or Dr. Prescott Gum office [phone]  6)  Your next ECT Treatment is Day Friday Date December 11, 2018 at 0800  We will call 2 days prior to your scheduled appointment for arrival times.  7)  Nothing to eat or drink after midnight the night before your procedure.  8)  Take lisonopril     With a sip of water the morning of your procedure.  9)  Other Instructions: Call [phone] to cancel the morning of your procedure due         to illness or emergency.  10) We will call within 72 hours to assess how you are feeling.

## 2018-11-27 NOTE — Transfer of Care (Signed)
Immediate Anesthesia Transfer of Care Note  Patient: Melinda [name]  Procedure(s) Performed: ECT TX  Patient Location: PACU  Anesthesia Type:General  Level of Consciousness: drowsy and patient cooperative  Airway & Oxygen Therapy: Patient Spontanous Breathing and Patient connected to face mask oxygen  Post-op Assessment: Report given to RN and Post -op Vital signs reviewed and stable  Post vital signs: Reviewed and stable  Last Vitals:  Vitals Value Taken Time  BP 149/128 11/27/2018 10:46 AM  Temp    Pulse 99 11/27/2018 10:47 AM  Resp 22 11/27/2018 10:46 AM  SpO2 99 % 11/27/2018 10:47 AM  Vitals shown include unvalidated device data.  Last Pain:  Vitals:   11/27/18 0821  TempSrc:   PainSc: 0-No pain         Complications: No apparent anesthesia complications

## 2018-11-27 NOTE — Procedures (Signed)
ECT SERVICES Physician's Interval Evaluation & Treatment Note  Patient Identification: Melinda [name] MRN:  [id number] Date of Evaluation:  11/27/2018 TX #: [id number]  MADRS:   MMSE:   P.E. Findings:  No change in physical exam  Psychiatric Interval Note:  Mood stable  Subjective:  Patient is a 48 y.o. female seen for evaluation for Electroconvulsive Therapy. No specific complaint  Treatment Summary:   []   Right Unilateral             [x]  Bilateral   % Energy : 1.0 ms 35%   Impedance: 890 ohms  Seizure Energy Index: 6568 V squared  Postictal Suppression Index: 79%  Seizure Concordance Index: 96%  Medications  Pre Shock: Robinul 0.4 mg Toradol 30 mg labetalol 20 mg esmolol 20 mg Brevital 70 mg succinylcholine 100 mg  Post Shock:    Seizure Duration: 44 seconds by motor movement 86 by EEG   Comments: Follow-up 2 weeks  Lungs:  [x]   Clear to auscultation               []  Other:   Heart:    [x]   Regular rhythm             []  irregular rhythm    [x]   Previous H&P reviewed, patient examined and there are NO CHANGES                 []   Previous H&P reviewed, patient examined and there are changes noted.   [name], MD 1/17/202010:41 AM

## 2018-11-27 NOTE — Anesthesia Post-op Follow-up Note (Signed)
Anesthesia QCDR form completed.        

## 2018-11-27 NOTE — Anesthesia Preprocedure Evaluation (Signed)
Anesthesia Evaluation  Patient identified by MRN, date of birth, ID band Patient awake    Reviewed: Allergy & Precautions, H&P , NPO status , Patient's Chart, lab work & pertinent test results  History of Anesthesia Complications Negative for: history of anesthetic complications  Airway Mallampati: II  TM Distance: >3 FB Neck ROM: full    Dental  (+) Poor Dentition, Chipped   Pulmonary sleep apnea , neg COPD,    Pulmonary exam normal breath sounds clear to auscultation       Cardiovascular hypertension, Pt. on medications (-) CAD and (-) Past MI negative cardio ROS Normal cardiovascular exam Rhythm:regular Rate:Normal     Neuro/Psych PSYCHIATRIC DISORDERS Depression Bipolar Disorder  Neuromuscular disease negative neurological ROS     GI/Hepatic negative GI ROS, Neg liver ROS, GERD  Medicated,  Endo/Other  diabetes, Type 2, Oral Hypoglycemic Agents  Renal/GU negative Renal ROS  negative genitourinary   Musculoskeletal   Abdominal (+) + obese,   Peds  Hematology negative hematology ROS (+)   Anesthesia Other Findings Past Medical History: No date: Depression No date: Diabetes mellitus without complication (HCC) 2/56/38: Diabetic peripheral neuropathy ([place]) 03/07/15: Diabetic peripheral neuropathy ([place]) 03/07/15: Diabetic peripheral neuropathy (HCC) No date: GERD (gastroesophageal reflux disease) 03/07/15: Hypercholesterolemia No date: Hypertension 03/07/15: Obesity 03/07/15: Personality disorder 03/07/15: Sinus tachycardia ([place])     Comment: history of 03/07/15: Suicidal thoughts Past Surgical History: 03/07/15: electroconvulsion therapy BMI    Body Mass Index:  36.44 kg/m     Reproductive/Obstetrics                             Anesthesia Physical  Anesthesia Plan  ASA: III  Anesthesia Plan: General   Post-op Pain Management:    Induction: Intravenous  PONV Risk Score and  Plan: 2 and Ondansetron  Airway Management Planned: Mask  Additional Equipment:   Intra-op Plan:   Post-operative Plan:   Informed Consent: I have reviewed the patients History and Physical, chart, labs and discussed the procedure including the risks, benefits and alternatives for the proposed anesthesia with the patient or authorized representative who has indicated his/her understanding and acceptance.     Dental Advisory Given  Plan Discussed with: CRNA and Anesthesiologist  Anesthesia Plan Comments:         Anesthesia Quick Evaluation

## 2018-11-27 NOTE — H&P (Signed)
Melinda [name] is an 48 y.o. female.   Chief Complaint: Patient feels about the same as usual HPI: History of recurrent depression with stability from ECT  Past Medical History:  Diagnosis Date  . Depression   . Diabetes mellitus without complication ([place])   . Diabetic peripheral neuropathy ([place]) 03/07/15  . Diabetic peripheral neuropathy ([place]) 03/07/15  . Diabetic peripheral neuropathy ([place]) 03/07/15  . GERD (gastroesophageal reflux disease)   . Hypercholesterolemia 03/07/15  . Hypertension   . Obesity 03/07/15  . Personality disorder ([place]) 03/07/15  . Sinus tachycardia 03/07/15   history of  . Suicidal thoughts 03/07/15    Past Surgical History:  Procedure Laterality Date  . electroconvulsion therapy  03/07/15    Family History  Problem Relation Age of Onset  . Hypertension Father   . Diabetes Mother    Social History:  reports that she has never smoked. She has never used smokeless tobacco. She reports that she does not drink alcohol or use drugs.  Allergies:  Allergies  Allergen Reactions  . Prednisone     Increases blood sugar    (Not in a hospital admission)   Results for orders placed or performed during the hospital encounter of 11/27/18 (from the past 48 hour(s))  Pregnancy, urine POC     Status: None   Collection Time: 11/27/18  8:32 AM  Result Value Ref Range   Preg Test, Ur NEGATIVE NEGATIVE    Comment:        THE SENSITIVITY OF THIS METHODOLOGY IS >24 mIU/mL   Glucose, capillary     Status: Abnormal   Collection Time: 11/27/18  8:38 AM  Result Value Ref Range   Glucose-Capillary 135 (H) 70 - 99 mg/dL   No results found.  Review of Systems  Constitutional: Negative.   HENT: Negative.   Eyes: Negative.   Respiratory: Negative.   Cardiovascular: Negative.   Gastrointestinal: Negative.   Musculoskeletal: Negative.   Skin: Negative.   Neurological: Negative.   Psychiatric/Behavioral: Negative.     Blood pressure (!) 162/91, pulse 94,  temperature 98.4 F (36.9 C), temperature source Oral, resp. rate 16, height 5\' 4"  (1.626 m), weight 119.3 kg, SpO2 100 %. Physical Exam  Nursing note and vitals reviewed. Constitutional: She appears well-developed and well-nourished.  HENT:  Head: Normocephalic and atraumatic.  Eyes: Pupils are equal, round, and reactive to light. Conjunctivae are normal.  Neck: Normal range of motion.  Cardiovascular: Regular rhythm and normal heart sounds.  Respiratory: Effort normal.  GI: Soft.  Musculoskeletal: Normal range of motion.  Neurological: She is alert.  Skin: Skin is warm and dry.  Psychiatric: She has a normal mood and affect. Her behavior is normal. Judgment and thought content normal.     Assessment/Plan Follow-up in 2 weeks  [name], MD 11/27/2018, 10:40 AM

## 2018-11-30 NOTE — Anesthesia Postprocedure Evaluation (Signed)
Anesthesia Post Note  Patient: Melinda [name]  Procedure(s) Performed: ECT TX  Patient location during evaluation: PACU Anesthesia Type: General Level of consciousness: awake and alert Pain management: pain level controlled Vital Signs Assessment: post-procedure vital signs reviewed and stable Respiratory status: spontaneous breathing, nonlabored ventilation, respiratory function stable and patient connected to nasal cannula oxygen Cardiovascular status: blood pressure returned to baseline and stable Postop Assessment: no apparent nausea or vomiting Anesthetic complications: no     Last Vitals:  Vitals:   11/27/18 1112 11/27/18 1117  BP: 134/78 139/84  Pulse: 81 75  Resp:  16  Temp: 36.8 C 36.6 C  SpO2: 98%     Last Pain:  Vitals:   11/30/18 1434  TempSrc:   PainSc: 0-No pain                 Martha Clan

## 2018-12-01 ENCOUNTER — Ambulatory Visit: Payer: Medicare PPO | Admitting: Certified Registered"

## 2018-12-01 ENCOUNTER — Encounter: Payer: Self-pay | Admitting: Student

## 2018-12-01 ENCOUNTER — Ambulatory Visit
Admission: RE | Admit: 2018-12-01 | Discharge: 2018-12-01 | Disposition: A | Discharge status: Home / Self Care | Payer: Medicare PPO | Attending: Gastroenterology | Admitting: Gastroenterology

## 2018-12-01 ENCOUNTER — Other Ambulatory Visit: Payer: Self-pay

## 2018-12-01 ENCOUNTER — Encounter
Admission: RE | Disposition: A | Discharge status: Home / Self Care | Payer: Self-pay | Source: Home / Self Care | Attending: Gastroenterology

## 2018-12-01 DIAGNOSIS — Z1211 Encounter for screening for malignant neoplasm of colon: Secondary | ICD-10-CM | POA: Diagnosis not present

## 2018-12-01 DIAGNOSIS — E1140 Type 2 diabetes mellitus with diabetic neuropathy, unspecified: Secondary | ICD-10-CM | POA: Diagnosis not present

## 2018-12-01 DIAGNOSIS — E7800 Pure hypercholesterolemia, unspecified: Secondary | ICD-10-CM | POA: Insufficient documentation

## 2018-12-01 DIAGNOSIS — G4730 Sleep apnea, unspecified: Secondary | ICD-10-CM | POA: Diagnosis not present

## 2018-12-01 DIAGNOSIS — F319 Bipolar disorder, unspecified: Secondary | ICD-10-CM | POA: Insufficient documentation

## 2018-12-01 DIAGNOSIS — I10 Essential (primary) hypertension: Secondary | ICD-10-CM | POA: Insufficient documentation

## 2018-12-01 DIAGNOSIS — Z6841 Body Mass Index (BMI) 40.0 and over, adult: Secondary | ICD-10-CM | POA: Diagnosis not present

## 2018-12-01 DIAGNOSIS — Z7984 Long term (current) use of oral hypoglycemic drugs: Secondary | ICD-10-CM | POA: Insufficient documentation

## 2018-12-01 DIAGNOSIS — Z79899 Other long term (current) drug therapy: Secondary | ICD-10-CM | POA: Diagnosis not present

## 2018-12-01 DIAGNOSIS — E669 Obesity, unspecified: Secondary | ICD-10-CM | POA: Insufficient documentation

## 2018-12-01 DIAGNOSIS — E1142 Type 2 diabetes mellitus with diabetic polyneuropathy: Secondary | ICD-10-CM | POA: Insufficient documentation

## 2018-12-01 HISTORY — PX: COLONOSCOPY WITH PROPOFOL: SHX5780

## 2018-12-01 LAB — POCT PREGNANCY, URINE: Preg Test, Ur: NEGATIVE

## 2018-12-01 SURGERY — COLONOSCOPY WITH PROPOFOL
Anesthesia: General

## 2018-12-01 MED ORDER — SODIUM CHLORIDE 0.9 % IV SOLN
INTRAVENOUS | Status: DC
  Administered 2018-12-01: 09:00:00 via INTRAVENOUS

## 2018-12-01 MED ORDER — FENTANYL CITRATE (PF) 100 MCG/2ML IJ SOLN
INTRAMUSCULAR | Status: DC | PRN
  Administered 2018-12-01: 25 ug via INTRAVENOUS

## 2018-12-01 MED ORDER — PHENYLEPHRINE HCL 10 MG/ML IJ SOLN
INTRAMUSCULAR | Status: DC | PRN
  Administered 2018-12-01: 100 ug via INTRAVENOUS

## 2018-12-01 MED ORDER — PROPOFOL 10 MG/ML IV BOLUS
INTRAVENOUS | Status: DC | PRN
  Administered 2018-12-01: 60 mg via INTRAVENOUS
  Administered 2018-12-01 (×2): 20 mg via INTRAVENOUS

## 2018-12-01 MED ORDER — PROPOFOL 500 MG/50ML IV EMUL
INTRAVENOUS | Status: DC | PRN
  Administered 2018-12-01: 90 ug/kg/min via INTRAVENOUS

## 2018-12-01 MED ORDER — PROPOFOL 500 MG/50ML IV EMUL
INTRAVENOUS | Status: AC
  Filled 2018-12-01: qty 50

## 2018-12-01 MED ORDER — LIDOCAINE HCL (CARDIAC) PF 100 MG/5ML IV SOSY
PREFILLED_SYRINGE | INTRAVENOUS | Status: DC | PRN
  Administered 2018-12-01: 60 mg via INTRATRACHEAL

## 2018-12-01 MED ORDER — LIDOCAINE HCL (PF) 2 % IJ SOLN
INTRAMUSCULAR | Status: AC
  Filled 2018-12-01: qty 10

## 2018-12-01 MED ORDER — FENTANYL CITRATE (PF) 100 MCG/2ML IJ SOLN
INTRAMUSCULAR | Status: AC
  Filled 2018-12-01: qty 2

## 2018-12-01 NOTE — Anesthesia Post-op Follow-up Note (Signed)
Anesthesia QCDR form completed.        

## 2018-12-01 NOTE — Transfer of Care (Signed)
Immediate Anesthesia Transfer of Care Note  Patient: Melinda [name]  Procedure(s) Performed: COLONOSCOPY WITH PROPOFOL (N/A )  Patient Location: Endoscopy Unit  Anesthesia Type:General  Level of Consciousness: awake  Airway & Oxygen Therapy: Patient Spontanous Breathing and Patient connected to nasal cannula oxygen  Post-op Assessment: Report given to RN and Post -op Vital signs reviewed and stable  Post vital signs: stable  Last Vitals:  Vitals Value Taken Time  BP 122/75 12/01/2018  9:30 AM  Temp 36.3 C 12/01/2018  9:28 AM  Pulse 78 12/01/2018  9:31 AM  Resp 23 12/01/2018  9:31 AM  SpO2 100 % 12/01/2018  9:31 AM  Vitals shown include unvalidated device data.  Last Pain:  Vitals:   12/01/18 0928  TempSrc: Tympanic  PainSc: 0-No pain         Complications: No apparent anesthesia complications

## 2018-12-01 NOTE — Anesthesia Preprocedure Evaluation (Signed)
Anesthesia Evaluation  Patient identified by MRN, date of birth, ID band Patient awake    Reviewed: Allergy & Precautions, H&P , NPO status , Patient's Chart, lab work & pertinent test results  History of Anesthesia Complications Negative for: history of anesthetic complications  Airway Mallampati: II  TM Distance: >3 FB Neck ROM: full    Dental  (+) Poor Dentition, Chipped   Pulmonary sleep apnea , neg COPD,    Pulmonary exam normal breath sounds clear to auscultation       Cardiovascular hypertension, Pt. on medications (-) CAD and (-) Past MI negative cardio ROS Normal cardiovascular exam Rhythm:regular Rate:Normal     Neuro/Psych PSYCHIATRIC DISORDERS Depression Bipolar Disorder  Neuromuscular disease negative neurological ROS     GI/Hepatic negative GI ROS, Neg liver ROS, GERD  Controlled,  Endo/Other  diabetes, Type 2, Oral Hypoglycemic Agents  Renal/GU negative Renal ROS  negative genitourinary   Musculoskeletal   Abdominal (+) + obese,   Peds  Hematology negative hematology ROS (+)   Anesthesia Other Findings Past Medical History: No date: Depression No date: Diabetes mellitus without complication (HCC) 4/81/85: Diabetic peripheral neuropathy ([place]) 03/07/15: Diabetic peripheral neuropathy ([place]) 03/07/15: Diabetic peripheral neuropathy (HCC) No date: GERD (gastroesophageal reflux disease) 03/07/15: Hypercholesterolemia No date: Hypertension 03/07/15: Obesity 03/07/15: Personality disorder 03/07/15: Sinus tachycardia ([place])     Comment: history of 03/07/15: Suicidal thoughts Past Surgical History: 03/07/15: electroconvulsion therapy BMI    Body Mass Index:  36.44 kg/m     Reproductive/Obstetrics                             Anesthesia Physical  Anesthesia Plan  ASA: III  Anesthesia Plan: General   Post-op Pain Management:    Induction: Intravenous  PONV Risk Score and  Plan:   Airway Management Planned: Nasal Cannula and Natural Airway  Additional Equipment:   Intra-op Plan:   Post-operative Plan:   Informed Consent: I have reviewed the patients History and Physical, chart, labs and discussed the procedure including the risks, benefits and alternatives for the proposed anesthesia with the patient or authorized representative who has indicated his/her understanding and acceptance.     Dental Advisory Given  Plan Discussed with: CRNA and Anesthesiologist  Anesthesia Plan Comments: (Patient consented for risks of anesthesia including but not limited to:  - adverse reactions to medications - damage to teeth, lips or other oral mucosa - sore throat or hoarseness - Damage to heart, brain, lungs or loss of life  Patient voiced understanding.)        Anesthesia Quick Evaluation

## 2018-12-01 NOTE — H&P (Signed)
Jonathon Bellows, MD [address], [place], [place], [place], [address] [address], [place], [place], [place], [address] Phone: [phone]  Fax: [phone]  Primary Care Physician:  Trinna Post, PA-C   Pre-Procedure History & Physical: HPI:  Melinda [name] is a 48 y.o. female is here for an colonoscopy.   Past Medical History:  Diagnosis Date  . Depression   . Diabetes mellitus without complication ([place])   . Diabetic peripheral neuropathy ([place]) 03/07/15  . Diabetic peripheral neuropathy ([place]) 03/07/15  . Diabetic peripheral neuropathy ([place]) 03/07/15  . GERD (gastroesophageal reflux disease)   . Hypercholesterolemia 03/07/15  . Hypertension   . Obesity 03/07/15  . Personality disorder ([place]) 03/07/15  . Sinus tachycardia 03/07/15   history of  . Suicidal thoughts 03/07/15    Past Surgical History:  Procedure Laterality Date  . electroconvulsion therapy  03/07/15    Prior to Admission medications   Medication Sig Start Date End Date Taking? Authorizing Provider  atorvastatin (LIPITOR) 10 MG tablet Take 10 mg by mouth at bedtime.   Yes [provider]  buPROPion (WELLBUTRIN XL) 300 MG 24 hr tablet TAKE 1 TABLET EVERY DAY 09/28/18  Yes Clapacs, John T, MD  escitalopram (LEXAPRO) 20 MG tablet TAKE 1 TABLET (20 MG TOTAL) BY MOUTH DAILY. 09/28/18  Yes Clapacs, Madie Reno, MD  lisinopril (PRINIVIL,ZESTRIL) 10 MG tablet Take 1 tablet (10 mg total) by mouth daily. 10/06/18 01/04/19 Yes Trinna Post, PA-C  pioglitazone (ACTOS) 30 MG tablet Take 1 tablet (30 mg total) by mouth daily. 05/16/17  Yes Clapacs, Madie Reno, MD  ziprasidone (GEODON) 80 MG capsule TAKE 1 CAPSULE TWO TIMES DAILY WITH A MEAL. 09/28/18  Yes Clapacs, Madie Reno, MD  amoxicillin (AMOXIL) 500 MG capsule Take 500 mg by mouth 3 (three) times daily. Take 1 capsule by mouth three times daily for 10 days.    Chauncey Mann, DMD  Lancets Misc. (ACCU-CHEK FASTCLIX LANCET) KIT  05/24/14   [provider]     Allergies as of 11/19/2018 - Review Complete 11/13/2018  Allergen Reaction Noted  . Prednisone  02/20/2015    Family History  Problem Relation Age of Onset  . Hypertension Father   . Diabetes Mother     Social History   Socioeconomic History  . Marital status: Single    Spouse name: Not on file  . Number of children: 1  . Years of education: Not on file  . Highest education level: Not on file  Occupational History  . Occupation: Disabled  Social Needs  . Financial resource strain: Not on file  . Food insecurity:    Worry: Not on file    Inability: Not on file  . Transportation needs:    Medical: Not on file    Non-medical: Not on file  Tobacco Use  . Smoking status: Never Smoker  . Smokeless tobacco: Never Used  Substance and Sexual Activity  . Alcohol use: No  . Drug use: No  . Sexual activity: Not Currently    Birth control/protection: Abstinence  Lifestyle  . Physical activity:    Days per week: Not on file    Minutes per session: Not on file  . Stress: Not on file  Relationships  . Social connections:    Talks on phone: Not on file    Gets together: Not on file    Attends religious service: Not on file    Active member of club or organization:  Not on file    Attends meetings of clubs or organizations: Not on file    Relationship status: Not on file  . Intimate partner violence:    Fear of current or ex partner: Not on file    Emotionally abused: Not on file    Physically abused: Not on file    Forced sexual activity: Not on file  Other Topics Concern  . Not on file  Social History Narrative  . Not on file    Review of Systems: See HPI, otherwise negative ROS  Physical Exam: BP (!) 144/87   Pulse 91   Temp 97.7 F (36.5 C) (Tympanic)   Resp 16   SpO2 98%  General:   Alert,  pleasant and cooperative in NAD Head:  Normocephalic and atraumatic. Neck:  Supple; no masses or thyromegaly. Lungs:  Clear throughout to auscultation, normal  respiratory effort.    Heart:  +S1, +S2, Regular rate and rhythm, No edema. Abdomen:  Soft, nontender and nondistended. Normal bowel sounds, without guarding, and without rebound.   Neurologic:  Alert and  oriented x4;  grossly normal neurologically.  Impression/Plan: [name] is here for an colonoscopy to be performed for Screening colonoscopy average risk   Risks, benefits, limitations, and alternatives regarding  colonoscopy have been reviewed with the patient.  Questions have been answered.  All parties agreeable.   Jonathon Bellows, MD  12/01/2018, 9:00 AM

## 2018-12-01 NOTE — Anesthesia Postprocedure Evaluation (Signed)
Anesthesia Post Note  Patient: Raja M Brege  Procedure(s) Performed: COLONOSCOPY WITH PROPOFOL (N/A )  Patient location during evaluation: Endoscopy Anesthesia Type: General Level of consciousness: awake and alert Pain management: pain level controlled Vital Signs Assessment: post-procedure vital signs reviewed and stable Respiratory status: spontaneous breathing, nonlabored ventilation, respiratory function stable and patient connected to nasal cannula oxygen Cardiovascular status: blood pressure returned to baseline and stable Postop Assessment: no apparent nausea or vomiting Anesthetic complications: no     Last Vitals:  Vitals:   12/01/18 0928 12/01/18 0930  BP: 122/75 122/75  Pulse: 78 79  Resp: (!) 21 (!) 24  Temp: (!) 36.3 C   SpO2: 100% 100%    Last Pain:  Vitals:   12/01/18 1000  TempSrc:   PainSc: 0-No pain                 Broadus John K Janeann Paisley

## 2018-12-01 NOTE — Op Note (Signed)
[facility] Gastroenterology Patient Name: Melinda [name] Procedure Date: 12/01/2018 9:03 AM MRN: [id number] Account #: [id number] Date of Birth: Nov 18, 1970 Admit Type: Outpatient Age: 48 Room: [facility] ENDO ROOM 1 Gender: Female Note Status: Finalized Procedure:            Colonoscopy Indications:          Screening for colorectal malignant neoplasm Providers:            Jonathon Bellows MD, MD Referring MD:         Wendee Beavers. [name] (Referring MD) Medicines:            Monitored Anesthesia Care Complications:        No immediate complications. Procedure:            Pre-Anesthesia Assessment:                       - Prior to the procedure, a History and Physical was                        performed, and patient medications, allergies and                        sensitivities were reviewed. The patient's tolerance of                        previous anesthesia was reviewed.                       - The risks and benefits of the procedure and the                        sedation options and risks were discussed with the                        patient. All questions were answered and informed                        consent was obtained.                       - ASA Grade Assessment: II - A patient with mild                        systemic disease.                       After obtaining informed consent, the colonoscope was                        passed under direct vision. Throughout the procedure,                        the patient's blood pressure, pulse, and oxygen                        saturations were monitored continuously. The                        Colonoscope was introduced through the anus and  advanced to the the cecum, identified by the                        appendiceal orifice, IC valve and transillumination.                        The colonoscopy was performed without difficulty. The                        patient tolerated the procedure well.  The quality of                        the bowel preparation was adequate. Findings:      The perianal and digital rectal examinations were normal.      The entire examined colon appeared normal on direct and retroflexion       views. Impression:           - The entire examined colon is normal on direct and                        retroflexion views.                       - No specimens collected. Recommendation:       - Discharge patient to home (with escort).                       - Resume previous diet.                       - Continue present medications.                       - Repeat colonoscopy in 10 years for screening purposes. Procedure Code(s):    --- Professional ---                       [id number], Colonoscopy, flexible; diagnostic, including                        collection of specimen(s) by brushing or washing, when                        performed (separate procedure) Diagnosis Code(s):    --- Professional ---                       Z12.11, Encounter for screening for malignant neoplasm                        of colon CPT copyright 2018 [employer] Association. All rights reserved. The codes documented in this report are preliminary and upon coder review may  be revised to meet current compliance requirements. Jonathon Bellows, MD Jonathon Bellows MD, MD 12/01/2018 9:22:43 AM This report has been signed electronically. Number of Addenda: 0 Note Initiated On: 12/01/2018 9:03 AM Scope Withdrawal Time: 0 hours 10 minutes 8 seconds  Total Procedure Duration: 0 hours 13 minutes 1 second       [facility]

## 2018-12-02 ENCOUNTER — Encounter: Payer: Self-pay | Admitting: Gastroenterology

## 2018-12-07 ENCOUNTER — Ambulatory Visit
Admission: RE | Admit: 2018-12-07 | Discharge: 2018-12-07 | Disposition: A | Discharge status: Home / Self Care | Payer: Medicare PPO | Source: Ambulatory Visit | Attending: Physician Assistant | Admitting: Physician Assistant

## 2018-12-07 ENCOUNTER — Telehealth: Payer: Self-pay

## 2018-12-07 DIAGNOSIS — Z1239 Encounter for other screening for malignant neoplasm of breast: Secondary | ICD-10-CM | POA: Diagnosis present

## 2018-12-07 DIAGNOSIS — Z1231 Encounter for screening mammogram for malignant neoplasm of breast: Secondary | ICD-10-CM | POA: Diagnosis not present

## 2018-12-07 NOTE — Telephone Encounter (Signed)
Patient advised as below.  

## 2018-12-07 NOTE — Telephone Encounter (Signed)
-----   Message from Trinna Post, [place] sent at 12/07/2018 11:30 AM EST ----- Normal mammo

## 2018-12-08 ENCOUNTER — Other Ambulatory Visit: Payer: Self-pay | Admitting: Psychiatry

## 2018-12-08 ENCOUNTER — Other Ambulatory Visit: Payer: Self-pay | Admitting: Physician Assistant

## 2018-12-08 DIAGNOSIS — I10 Essential (primary) hypertension: Secondary | ICD-10-CM

## 2018-12-09 ENCOUNTER — Telehealth: Payer: Self-pay

## 2018-12-10 ENCOUNTER — Other Ambulatory Visit: Payer: Self-pay | Admitting: Psychiatry

## 2018-12-11 ENCOUNTER — Encounter: Payer: Self-pay | Admitting: Anesthesiology

## 2018-12-11 ENCOUNTER — Encounter (HOSPITAL_BASED_OUTPATIENT_CLINIC_OR_DEPARTMENT_OTHER)
Admission: RE | Admit: 2018-12-11 | Discharge: 2018-12-11 | Disposition: A | Discharge status: Home / Self Care | Payer: Medicare PPO | Source: Ambulatory Visit | Attending: Psychiatry | Admitting: Psychiatry

## 2018-12-11 DIAGNOSIS — E1140 Type 2 diabetes mellitus with diabetic neuropathy, unspecified: Secondary | ICD-10-CM | POA: Diagnosis not present

## 2018-12-11 DIAGNOSIS — I10 Essential (primary) hypertension: Secondary | ICD-10-CM | POA: Diagnosis not present

## 2018-12-11 DIAGNOSIS — F332 Major depressive disorder, recurrent severe without psychotic features: Secondary | ICD-10-CM | POA: Diagnosis not present

## 2018-12-11 DIAGNOSIS — E1142 Type 2 diabetes mellitus with diabetic polyneuropathy: Secondary | ICD-10-CM | POA: Diagnosis not present

## 2018-12-11 DIAGNOSIS — Z888 Allergy status to other drugs, medicaments and biological substances status: Secondary | ICD-10-CM | POA: Diagnosis not present

## 2018-12-11 DIAGNOSIS — G4730 Sleep apnea, unspecified: Secondary | ICD-10-CM | POA: Diagnosis not present

## 2018-12-11 DIAGNOSIS — K219 Gastro-esophageal reflux disease without esophagitis: Secondary | ICD-10-CM | POA: Diagnosis not present

## 2018-12-11 DIAGNOSIS — F329 Major depressive disorder, single episode, unspecified: Secondary | ICD-10-CM | POA: Diagnosis not present

## 2018-12-11 DIAGNOSIS — E7800 Pure hypercholesterolemia, unspecified: Secondary | ICD-10-CM | POA: Diagnosis not present

## 2018-12-11 LAB — GLUCOSE, CAPILLARY: Glucose-Capillary: 123 mg/dL — ABNORMAL HIGH (ref 70–99)

## 2018-12-11 MED ORDER — ESMOLOL HCL 100 MG/10ML IV SOLN
INTRAVENOUS | Status: AC
  Filled 2018-12-11: qty 10

## 2018-12-11 MED ORDER — LABETALOL HCL 5 MG/ML IV SOLN
INTRAVENOUS | Status: AC
  Filled 2018-12-11: qty 8

## 2018-12-11 MED ORDER — GLYCOPYRROLATE 0.2 MG/ML IJ SOLN
INTRAMUSCULAR | Status: AC
  Administered 2018-12-11: 0.4 mg via INTRAVENOUS
  Filled 2018-12-11: qty 2

## 2018-12-11 MED ORDER — ESMOLOL HCL 100 MG/10ML IV SOLN
INTRAVENOUS | Status: DC | PRN
  Administered 2018-12-11: 20 mg via INTRAVENOUS

## 2018-12-11 MED ORDER — METHOHEXITAL SODIUM 0.5 G IJ SOLR
INTRAMUSCULAR | Status: AC
  Filled 2018-12-11: qty 500

## 2018-12-11 MED ORDER — LABETALOL HCL 5 MG/ML IV SOLN
INTRAVENOUS | Status: DC | PRN
  Administered 2018-12-11: 30 mg via INTRAVENOUS

## 2018-12-11 MED ORDER — SODIUM CHLORIDE 0.9 % IV SOLN
INTRAVENOUS | Status: DC | PRN
  Administered 2018-12-11: 11:00:00 via INTRAVENOUS

## 2018-12-11 MED ORDER — GLYCOPYRROLATE 0.2 MG/ML IJ SOLN
0.4000 mg | Freq: Once | INTRAMUSCULAR | Status: AC
  Administered 2018-12-11: 0.4 mg via INTRAVENOUS

## 2018-12-11 MED ORDER — METHOHEXITAL SODIUM 100 MG/10ML IV SOSY
PREFILLED_SYRINGE | INTRAVENOUS | Status: DC | PRN
  Administered 2018-12-11: 70 mg via INTRAVENOUS

## 2018-12-11 MED ORDER — KETOROLAC TROMETHAMINE 30 MG/ML IJ SOLN
INTRAMUSCULAR | Status: AC
  Administered 2018-12-11: 30 mg via INTRAVENOUS
  Filled 2018-12-11: qty 1

## 2018-12-11 MED ORDER — SODIUM CHLORIDE 0.9 % IV SOLN
500.0000 mL | Freq: Once | INTRAVENOUS | Status: AC
  Administered 2018-12-11: 500 mL via INTRAVENOUS

## 2018-12-11 MED ORDER — KETOROLAC TROMETHAMINE 30 MG/ML IJ SOLN
30.0000 mg | Freq: Once | INTRAMUSCULAR | Status: AC
  Administered 2018-12-11: 30 mg via INTRAVENOUS

## 2018-12-11 MED ORDER — SUCCINYLCHOLINE CHLORIDE 20 MG/ML IJ SOLN
INTRAMUSCULAR | Status: DC | PRN
  Administered 2018-12-11: 100 mg via INTRAVENOUS

## 2018-12-11 NOTE — Anesthesia Preprocedure Evaluation (Signed)
Anesthesia Evaluation  Patient identified by MRN, date of birth, ID band Patient awake    Reviewed: Allergy & Precautions, H&P , NPO status , Patient's Chart, lab work & pertinent test results  History of Anesthesia Complications Negative for: history of anesthetic complications  Airway Mallampati: II  TM Distance: >3 FB Neck ROM: full    Dental  (+) Poor Dentition, Chipped   Pulmonary sleep apnea , neg COPD,    Pulmonary exam normal breath sounds clear to auscultation       Cardiovascular hypertension, Pt. on medications (-) CAD and (-) Past MI negative cardio ROS Normal cardiovascular exam Rhythm:regular Rate:Normal     Neuro/Psych PSYCHIATRIC DISORDERS Depression Bipolar Disorder  Neuromuscular disease negative neurological ROS     GI/Hepatic negative GI ROS, Neg liver ROS, GERD  Medicated,  Endo/Other  diabetes, Type 2, Oral Hypoglycemic Agents  Renal/GU negative Renal ROS  negative genitourinary   Musculoskeletal   Abdominal (+) + obese,   Peds  Hematology negative hematology ROS (+)   Anesthesia Other Findings Past Medical History: No date: Depression No date: Diabetes mellitus without complication (HCC) 2/56/38: Diabetic peripheral neuropathy ([place]) 03/07/15: Diabetic peripheral neuropathy ([place]) 03/07/15: Diabetic peripheral neuropathy (HCC) No date: GERD (gastroesophageal reflux disease) 03/07/15: Hypercholesterolemia No date: Hypertension 03/07/15: Obesity 03/07/15: Personality disorder 03/07/15: Sinus tachycardia ([place])     Comment: history of 03/07/15: Suicidal thoughts Past Surgical History: 03/07/15: electroconvulsion therapy BMI    Body Mass Index:  36.44 kg/m     Reproductive/Obstetrics                             Anesthesia Physical  Anesthesia Plan  ASA: III  Anesthesia Plan: General   Post-op Pain Management:    Induction: Intravenous  PONV Risk Score and  Plan: 2 and Ondansetron  Airway Management Planned: Mask  Additional Equipment:   Intra-op Plan:   Post-operative Plan:   Informed Consent: I have reviewed the patients History and Physical, chart, labs and discussed the procedure including the risks, benefits and alternatives for the proposed anesthesia with the patient or authorized representative who has indicated his/her understanding and acceptance.     Dental Advisory Given  Plan Discussed with: CRNA and Anesthesiologist  Anesthesia Plan Comments:         Anesthesia Quick Evaluation

## 2018-12-11 NOTE — Anesthesia Post-op Follow-up Note (Signed)
Anesthesia QCDR form completed.        

## 2018-12-11 NOTE — Procedures (Signed)
ECT SERVICES Physician's Interval Evaluation & Treatment Note  Patient Identification: Melinda [name] MRN:  [id number] Date of Evaluation:  12/11/2018 TX #: [id number]  MADRS:   MMSE:   P.E. Findings:  No change to physical exam  Psychiatric Interval Note:  Reporting that she feels a little more down grumpy and out of sorts.  Subjective:  Patient is a 48 y.o. female seen for evaluation for Electroconvulsive Therapy. Chronic depression a little more marketed  Treatment Summary:   []   Right Unilateral             [x]  Bilateral   % Energy : 1.0 ms 35%   Impedance: 2850 ohms  Seizure Energy Index:    Postictal Suppression Index:    Seizure Concordance Index: 96%  Medications  Pre Shock: Robinul 0.4 mg Toradol 30 mg labetalol 20 mg esmolol 20 mg Brevital 70 mg succinylcholine 100 mg  Post Shock:    Seizure Duration: 32 seconds EMG 71 seconds EEG   Comments: Follow-up 2 weeks  Lungs:  [x]   Clear to auscultation               []  Other:   Heart:    [x]   Regular rhythm             []  irregular rhythm    [x]   Previous H&P reviewed, patient examined and there are NO CHANGES                 []   Previous H&P reviewed, patient examined and there are changes noted.   [name], MD 1/31/202010:32 AM

## 2018-12-11 NOTE — Transfer of Care (Signed)
Immediate Anesthesia Transfer of Care Note  Patient: Melinda [name]  Procedure(s) Performed: ECT TX  Patient Location: PACU  Anesthesia Type:General  Level of Consciousness: drowsy  Airway & Oxygen Therapy: Patient Spontanous Breathing and Patient connected to face mask oxygen  Post-op Assessment: Report given to RN and Post -op Vital signs reviewed and stable  Post vital signs: Reviewed and stable  Last Vitals:  Vitals Value Taken Time  BP 89/55 12/11/2018 10:49 AM  Temp    Pulse 103 12/11/2018 10:49 AM  Resp 22 12/11/2018 10:49 AM  SpO2 98 % 12/11/2018 10:49 AM  Vitals shown include unvalidated device data.  Last Pain:  Vitals:   12/11/18 0829  TempSrc: Oral         Complications: No apparent anesthesia complications

## 2018-12-11 NOTE — Anesthesia Procedure Notes (Signed)
Procedure Name: MAC Date/Time: 12/11/2018 10:40 AM Performed by: Eben Burow, CRNA Pre-anesthesia Checklist: Patient identified, Emergency Drugs available, Suction available, Patient being monitored and Timeout performed Patient Re-evaluated:Patient Re-evaluated prior to induction Oxygen Delivery Method: Circle system utilized Preoxygenation: Pre-oxygenation with 100% oxygen Induction Type: IV induction Ventilation: Mask ventilation without difficulty Airway Equipment and Method: Bite block Dental Injury: Teeth and Oropharynx as per pre-operative assessment

## 2018-12-11 NOTE — Discharge Instructions (Signed)
1)  The drugs that you have been given will stay in your system until tomorrow so for the       next 24 hours you should not:  A. Drive an automobile  B. Make any legal decisions  C. Drink any alcoholic beverages  2)  You may resume your regular meals upon return home.  3)  A responsible adult must take you home.  Someone should stay with you for a few          hours, then be available by phone for the remainder of the treatment day.  4)  You May experience any of the following symptoms:  Headache, Nausea and a dry mouth (due to the medications you were given),  temporary memory loss and some confusion, or sore muscles (a warm bath  should help this).  If you you experience any of these symptoms let us know on                your return visit.  5)  Report any of the following: any acute discomfort, severe headache, or temperature        greater than 100.5 F.   Also report any unusual redness, swelling, drainage, or pain         at your IV site.    You may report Symptoms to:  [place] at [facility]          Phone: [phone], ECT Department           or Dr. Prescott Gum office [phone]  6)  Your next ECT Treatment is Friday February 14   We will call 2 days prior to your scheduled appointment for arrival times.  7)  Nothing to eat or drink after midnight the night before your procedure.  8)  Take    With a sip of water the morning of your procedure.  9)  Other Instructions: Call [phone] to cancel the morning of your procedure due         to illness or emergency.  10) We will call within 72 hours to assess how you are feeling.

## 2018-12-11 NOTE — H&P (Signed)
Melinda [name] is an 48 y.o. female.   Chief Complaint: Mood chronicall depressed  HPI: chronic depression   Past Medical History:  Diagnosis Date  . Depression   . Diabetes mellitus without complication ([place])   . Diabetic peripheral neuropathy ([place]) 03/07/15  . Diabetic peripheral neuropathy ([place]) 03/07/15  . Diabetic peripheral neuropathy ([place]) 03/07/15  . GERD (gastroesophageal reflux disease)   . Hypercholesterolemia 03/07/15  . Hypertension   . Obesity 03/07/15  . Personality disorder ([place]) 03/07/15  . Sinus tachycardia 03/07/15   history of  . Suicidal thoughts 03/07/15    Past Surgical History:  Procedure Laterality Date  . COLONOSCOPY WITH PROPOFOL N/A 12/01/2018   Procedure: COLONOSCOPY WITH PROPOFOL;  Surgeon: Jonathon Bellows, MD;  Location: [facility] ENDOSCOPY;  Service: Gastroenterology;  Laterality: N/A;  . electroconvulsion therapy  03/07/15    Family History  Problem Relation Age of Onset  . Hypertension Father   . Diabetes Mother    Social History:  reports that she has never smoked. She has never used smokeless tobacco. She reports that she does not drink alcohol or use drugs.  Allergies:  Allergies  Allergen Reactions  . Prednisone     Increases blood sugar    (Not in a hospital admission)   No results found for this or any previous visit (from the past 48 hour(s)). No results found.  Review of Systems  Constitutional: Negative.   HENT: Negative.   Eyes: Negative.   Respiratory: Negative.   Cardiovascular: Negative.   Gastrointestinal: Negative.   Musculoskeletal: Negative.   Skin: Negative.   Neurological: Negative.   Psychiatric/Behavioral: Positive for depression. Negative for hallucinations, memory loss, substance abuse and suicidal ideas. The patient is not nervous/anxious and does not have insomnia.     Blood pressure 140/73, pulse 87, temperature 97.6 F (36.4 C), temperature source Oral, resp. rate 18, height 5\' 4"  (1.626 m), weight 118.8 kg,  SpO2 100 %. Physical Exam  Nursing note and vitals reviewed. Constitutional: She appears well-developed and well-nourished.  HENT:  Head: Normocephalic and atraumatic.  Eyes: Pupils are equal, round, and reactive to light. Conjunctivae are normal.  Neck: Normal range of motion.  Cardiovascular: Regular rhythm and normal heart sounds.  Respiratory: Effort normal.  GI: Soft.  Musculoskeletal: Normal range of motion.  Neurological: She is alert.  Skin: Skin is warm and dry.  Psychiatric: Her speech is normal and behavior is normal. She exhibits a depressed mood.     Assessment/Plan REgular schedule of return in two weeks  [name], MD 12/11/2018, 9:44 AM

## 2018-12-13 NOTE — Anesthesia Postprocedure Evaluation (Signed)
Anesthesia Post Note  Patient: Melinda [name]  Procedure(s) Performed: ECT TX  Patient location during evaluation: PACU Anesthesia Type: General Level of consciousness: awake and alert Pain management: pain level controlled Vital Signs Assessment: post-procedure vital signs reviewed and stable Respiratory status: spontaneous breathing, nonlabored ventilation, respiratory function stable and patient connected to nasal cannula oxygen Cardiovascular status: blood pressure returned to baseline and stable Postop Assessment: no apparent nausea or vomiting Anesthetic complications: no     Last Vitals:  Vitals:   12/11/18 1105 12/11/18 1123  BP: 131/75 132/72  Pulse: 95 85  Resp: (!) 22 16  Temp: 36.9 C 36.9 C  SpO2: 95%     Last Pain:  Vitals:   12/11/18 1123  TempSrc: Oral  PainSc: 0-No pain                 Martha Clan

## 2018-12-23 ENCOUNTER — Telehealth: Payer: Self-pay

## 2018-12-24 ENCOUNTER — Other Ambulatory Visit: Payer: Self-pay | Admitting: Psychiatry

## 2018-12-25 ENCOUNTER — Encounter: Payer: Self-pay | Admitting: Anesthesiology

## 2018-12-25 ENCOUNTER — Encounter
Admission: RE | Admit: 2018-12-25 | Discharge: 2018-12-25 | Disposition: A | Discharge status: Home / Self Care | Payer: Medicare PPO | Source: Ambulatory Visit | Attending: Psychiatry | Admitting: Psychiatry

## 2018-12-25 DIAGNOSIS — I10 Essential (primary) hypertension: Secondary | ICD-10-CM | POA: Insufficient documentation

## 2018-12-25 DIAGNOSIS — G4730 Sleep apnea, unspecified: Secondary | ICD-10-CM | POA: Diagnosis not present

## 2018-12-25 DIAGNOSIS — F332 Major depressive disorder, recurrent severe without psychotic features: Secondary | ICD-10-CM | POA: Diagnosis not present

## 2018-12-25 DIAGNOSIS — E1140 Type 2 diabetes mellitus with diabetic neuropathy, unspecified: Secondary | ICD-10-CM | POA: Diagnosis not present

## 2018-12-25 DIAGNOSIS — E1142 Type 2 diabetes mellitus with diabetic polyneuropathy: Secondary | ICD-10-CM | POA: Insufficient documentation

## 2018-12-25 DIAGNOSIS — F329 Major depressive disorder, single episode, unspecified: Secondary | ICD-10-CM | POA: Insufficient documentation

## 2018-12-25 DIAGNOSIS — E7800 Pure hypercholesterolemia, unspecified: Secondary | ICD-10-CM | POA: Diagnosis not present

## 2018-12-25 LAB — POCT PREGNANCY, URINE: Preg Test, Ur: NEGATIVE

## 2018-12-25 LAB — GLUCOSE, CAPILLARY: Glucose-Capillary: 119 mg/dL — ABNORMAL HIGH (ref 70–99)

## 2018-12-25 MED ORDER — ESMOLOL HCL 100 MG/10ML IV SOLN
INTRAVENOUS | Status: DC | PRN
  Administered 2018-12-25: 20 mg via INTRAVENOUS

## 2018-12-25 MED ORDER — GLYCOPYRROLATE 0.2 MG/ML IJ SOLN
0.4000 mg | Freq: Once | INTRAMUSCULAR | Status: AC
  Administered 2018-12-25: 0.4 mg via INTRAVENOUS

## 2018-12-25 MED ORDER — SODIUM CHLORIDE 0.9 % IV SOLN
INTRAVENOUS | Status: DC | PRN
  Administered 2018-12-25: 11:00:00 via INTRAVENOUS

## 2018-12-25 MED ORDER — SUCCINYLCHOLINE CHLORIDE 200 MG/10ML IV SOSY
PREFILLED_SYRINGE | INTRAVENOUS | Status: DC | PRN
  Administered 2018-12-25: 100 mg via INTRAVENOUS

## 2018-12-25 MED ORDER — GLYCOPYRROLATE 0.2 MG/ML IJ SOLN
INTRAMUSCULAR | Status: AC
  Filled 2018-12-25: qty 2

## 2018-12-25 MED ORDER — SODIUM CHLORIDE 0.9 % IV SOLN
500.0000 mL | Freq: Once | INTRAVENOUS | Status: AC
  Administered 2018-12-25: 500 mL via INTRAVENOUS

## 2018-12-25 MED ORDER — METHOHEXITAL SODIUM 100 MG/10ML IV SOSY
PREFILLED_SYRINGE | INTRAVENOUS | Status: DC | PRN
  Administered 2018-12-25: 70 mg via INTRAVENOUS

## 2018-12-25 MED ORDER — LABETALOL HCL 5 MG/ML IV SOLN
INTRAVENOUS | Status: DC | PRN
  Administered 2018-12-25: 30 mg via INTRAVENOUS

## 2018-12-25 NOTE — Discharge Instructions (Addendum)
1)  The drugs that you have been given will stay in your system until tomorrow so for the       next 24 hours you should not:  A. Drive an automobile  B. Make any legal decisions  C. Drink any alcoholic beverages  2)  You may resume your regular meals upon return home.  3)  A responsible adult must take you home.  Someone should stay with you for a few          hours, then be available by phone for the remainder of the treatment day.  4)  You May experience any of the following symptoms:  Headache, Nausea and a dry mouth (due to the medications you were given),  temporary memory loss and some confusion, or sore muscles (a warm bath  should help this).  If you you experience any of these symptoms let us know on                your return visit.  5)  Report any of the following: any acute discomfort, severe headache, or temperature        greater than 100.5 F.   Also report any unusual redness, swelling, drainage, or pain         at your IV site.    You may report Symptoms to:  [place] at [facility]          Phone: [phone], ECT Department           or Dr. Prescott Gum office [phone]  6)  Your next ECT Treatment is Friday February 28   We will call 2 days prior to your scheduled appointment for arrival times.  7)  Nothing to eat or drink after midnight the night before your procedure.  8)  Take lisnopril    With a sip of water the morning of your procedure.  9)  Other Instructions: Call [phone] to cancel the morning of your procedure due         to illness or emergency.  10) We will call within 72 hours to assess how you are feeling.

## 2018-12-25 NOTE — Anesthesia Preprocedure Evaluation (Signed)
Anesthesia Evaluation  Patient identified by MRN, date of birth, ID band Patient awake    Reviewed: Allergy & Precautions, H&P , NPO status , Patient's Chart, lab work & pertinent test results  History of Anesthesia Complications Negative for: history of anesthetic complications  Airway Mallampati: II  TM Distance: >3 FB Neck ROM: full    Dental  (+) Poor Dentition, Chipped   Pulmonary sleep apnea , neg COPD,    Pulmonary exam normal breath sounds clear to auscultation       Cardiovascular hypertension, Pt. on medications (-) CAD and (-) Past MI negative cardio ROS Normal cardiovascular exam Rhythm:regular Rate:Normal     Neuro/Psych PSYCHIATRIC DISORDERS Depression Bipolar Disorder  Neuromuscular disease negative neurological ROS     GI/Hepatic negative GI ROS, Neg liver ROS, GERD  Medicated,  Endo/Other  diabetes, Type 2, Oral Hypoglycemic Agents  Renal/GU negative Renal ROS  negative genitourinary   Musculoskeletal   Abdominal (+) + obese,   Peds  Hematology negative hematology ROS (+)   Anesthesia Other Findings Past Medical History: No date: Depression No date: Diabetes mellitus without complication (HCC) 2/56/38: Diabetic peripheral neuropathy ([place]) 03/07/15: Diabetic peripheral neuropathy ([place]) 03/07/15: Diabetic peripheral neuropathy (HCC) No date: GERD (gastroesophageal reflux disease) 03/07/15: Hypercholesterolemia No date: Hypertension 03/07/15: Obesity 03/07/15: Personality disorder 03/07/15: Sinus tachycardia ([place])     Comment: history of 03/07/15: Suicidal thoughts Past Surgical History: 03/07/15: electroconvulsion therapy BMI    Body Mass Index:  36.44 kg/m     Reproductive/Obstetrics                             Anesthesia Physical  Anesthesia Plan  ASA: III  Anesthesia Plan: General   Post-op Pain Management:    Induction: Intravenous  PONV Risk Score and  Plan: 2 and Ondansetron  Airway Management Planned: Mask  Additional Equipment:   Intra-op Plan:   Post-operative Plan:   Informed Consent: I have reviewed the patients History and Physical, chart, labs and discussed the procedure including the risks, benefits and alternatives for the proposed anesthesia with the patient or authorized representative who has indicated his/her understanding and acceptance.     Dental Advisory Given  Plan Discussed with: CRNA and Anesthesiologist  Anesthesia Plan Comments:         Anesthesia Quick Evaluation

## 2018-12-25 NOTE — H&P (Signed)
Melinda [name] is an 48 y.o. female.   Chief Complaint: chronic grumppy depression  HPI: chronic depression  Past Medical History:  Diagnosis Date  . Depression   . Diabetes mellitus without complication ([place])   . Diabetic peripheral neuropathy ([place]) 03/07/15  . Diabetic peripheral neuropathy ([place]) 03/07/15  . Diabetic peripheral neuropathy ([place]) 03/07/15  . GERD (gastroesophageal reflux disease)   . Hypercholesterolemia 03/07/15  . Hypertension   . Obesity 03/07/15  . Personality disorder ([place]) 03/07/15  . Sinus tachycardia 03/07/15   history of  . Suicidal thoughts 03/07/15    Past Surgical History:  Procedure Laterality Date  . COLONOSCOPY WITH PROPOFOL N/A 12/01/2018   Procedure: COLONOSCOPY WITH PROPOFOL;  Surgeon: Jonathon Bellows, MD;  Location: [facility] ENDOSCOPY;  Service: Gastroenterology;  Laterality: N/A;  . electroconvulsion therapy  03/07/15    Family History  Problem Relation Age of Onset  . Hypertension Father   . Diabetes Mother    Social History:  reports that she has never smoked. She has never used smokeless tobacco. She reports that she does not drink alcohol or use drugs.  Allergies:  Allergies  Allergen Reactions  . Prednisone     Increases blood sugar    (Not in a hospital admission)   Results for orders placed or performed during the hospital encounter of 12/25/18 (from the past 48 hour(s))  Pregnancy, urine POC     Status: None   Collection Time: 12/25/18  9:29 AM  Result Value Ref Range   Preg Test, Ur NEGATIVE NEGATIVE    Comment:        THE SENSITIVITY OF THIS METHODOLOGY IS >24 mIU/mL   Glucose, capillary     Status: Abnormal   Collection Time: 12/25/18  9:35 AM  Result Value Ref Range   Glucose-Capillary 119 (H) 70 - 99 mg/dL   No results found.  Review of Systems  Constitutional: Negative.   HENT: Negative.   Eyes: Negative.   Respiratory: Negative.   Cardiovascular: Negative.   Gastrointestinal: Negative.   Musculoskeletal: Negative.    Skin: Negative.   Neurological: Negative.   Psychiatric/Behavioral: Negative.     Blood pressure (!) 146/91, pulse 91, temperature 98.3 F (36.8 C), temperature source Oral, resp. rate 16, height 5\' 4"  (1.626 m), weight 117.5 kg, SpO2 99 %. Physical Exam  Nursing note and vitals reviewed. Constitutional: She appears well-developed and well-nourished.  HENT:  Head: Normocephalic and atraumatic.  Eyes: Pupils are equal, round, and reactive to light. Conjunctivae are normal.  Neck: Normal range of motion.  Cardiovascular: Regular rhythm and normal heart sounds.  Respiratory: Effort normal.  GI: Soft.  Musculoskeletal: Normal range of motion.  Neurological: She is alert.  Skin: Skin is warm and dry.  Psychiatric: She has a normal mood and affect. Her behavior is normal. Judgment and thought content normal.     Assessment/Plan ect today and continue q2weeks  [name], MD 12/25/2018, 9:58 AM

## 2018-12-25 NOTE — Anesthesia Post-op Follow-up Note (Signed)
Anesthesia QCDR form completed.        

## 2018-12-25 NOTE — Procedures (Signed)
ECT SERVICES Physician's Interval Evaluation & Treatment Note  Patient Identification: Melinda [name] MRN:  [id number] Date of Evaluation:  12/25/2018 TX #: [id number]  MADRS: 17 MMSE: 29  P.E. Findings:  No change in physical exam  Psychiatric Interval Note:  Chronic grumpiness but no suicidal ideation  Subjective:  Patient is a 48 y.o. female seen for evaluation for Electroconvulsive Therapy. Stable refraining from self injury  Treatment Summary:   []   Right Unilateral             [x]  Bilateral   % Energy : 1.0 ms 35%   Impedance: 1190 ohms  Seizure Energy Index: 9864 V squared  Postictal Suppression Index: 84%  Seizure Concordance Index: 97%  Medications  Pre Shock: Robinul 0.4 mg Toradol 30 mg labetalol milligrams esmolol 20 mg Brevital 70 mg succinylcholine 100 mg  Post Shock:    Seizure Duration: 38 seconds EMG 83 seconds EEG   Comments: Follow-up 2 weeks  Lungs:  [x]   Clear to auscultation               []  Other:   Heart:    [x]   Regular rhythm             []  irregular rhythm    [x]   Previous H&P reviewed, patient examined and there are NO CHANGES                 []   Previous H&P reviewed, patient examined and there are changes noted.   [name], MD 2/14/202011:21 AM

## 2018-12-25 NOTE — Anesthesia Postprocedure Evaluation (Signed)
Anesthesia Post Note  Patient: Melinda [name]  Procedure(s) Performed: ECT TX  Patient location during evaluation: PACU Anesthesia Type: General Level of consciousness: awake and alert Pain management: pain level controlled Vital Signs Assessment: post-procedure vital signs reviewed and stable Respiratory status: spontaneous breathing, nonlabored ventilation and respiratory function stable Cardiovascular status: blood pressure returned to baseline and stable Postop Assessment: no signs of nausea or vomiting Anesthetic complications: no     Last Vitals:  Vitals:   12/25/18 1136 12/25/18 1146  BP: (!) 135/120 120/82  Pulse: 99 95  Resp: (!) 28 16  Temp: 36.5 C   SpO2: 96% 94%    Last Pain:  Vitals:   12/25/18 1146  TempSrc:   PainSc: 0-No pain                 Ulis Kaps

## 2018-12-25 NOTE — Transfer of Care (Signed)
Immediate Anesthesia Transfer of Care Note  Patient: Melinda [name]  Procedure(s) Performed: ECT TX  Patient Location: PACU  Anesthesia Type:General  Level of Consciousness: sedated  Airway & Oxygen Therapy: Patient Spontanous Breathing and Patient connected to face mask oxygen  Post-op Assessment: Report given to RN and Post -op Vital signs reviewed and stable  Post vital signs: Reviewed and stable  Last Vitals:  Vitals Value Taken Time  BP    Temp    Pulse    Resp 28 12/25/2018 11:36 AM  SpO2    Vitals shown include unvalidated device data.  Last Pain:  Vitals:   12/25/18 0858  TempSrc:   PainSc: 0-No pain         Complications: No apparent anesthesia complications

## 2019-01-06 ENCOUNTER — Telehealth: Payer: Self-pay

## 2019-01-07 ENCOUNTER — Other Ambulatory Visit: Payer: Self-pay | Admitting: Psychiatry

## 2019-01-08 ENCOUNTER — Encounter: Payer: Self-pay | Admitting: Anesthesiology

## 2019-01-08 ENCOUNTER — Encounter (HOSPITAL_BASED_OUTPATIENT_CLINIC_OR_DEPARTMENT_OTHER)
Admission: RE | Admit: 2019-01-08 | Discharge: 2019-01-08 | Disposition: A | Discharge status: Home / Self Care | Payer: Medicare PPO | Source: Ambulatory Visit | Attending: Psychiatry | Admitting: Psychiatry

## 2019-01-08 DIAGNOSIS — I10 Essential (primary) hypertension: Secondary | ICD-10-CM | POA: Diagnosis not present

## 2019-01-08 DIAGNOSIS — F332 Major depressive disorder, recurrent severe without psychotic features: Secondary | ICD-10-CM

## 2019-01-08 DIAGNOSIS — E7800 Pure hypercholesterolemia, unspecified: Secondary | ICD-10-CM | POA: Diagnosis not present

## 2019-01-08 DIAGNOSIS — E1142 Type 2 diabetes mellitus with diabetic polyneuropathy: Secondary | ICD-10-CM | POA: Diagnosis not present

## 2019-01-08 DIAGNOSIS — F329 Major depressive disorder, single episode, unspecified: Secondary | ICD-10-CM | POA: Diagnosis not present

## 2019-01-08 DIAGNOSIS — K219 Gastro-esophageal reflux disease without esophagitis: Secondary | ICD-10-CM | POA: Diagnosis not present

## 2019-01-08 DIAGNOSIS — E1140 Type 2 diabetes mellitus with diabetic neuropathy, unspecified: Secondary | ICD-10-CM | POA: Diagnosis not present

## 2019-01-08 LAB — GLUCOSE, CAPILLARY
Glucose-Capillary: 116 mg/dL — ABNORMAL HIGH (ref 70–99)
Glucose-Capillary: 136 mg/dL — ABNORMAL HIGH (ref 70–99)

## 2019-01-08 MED ORDER — GLYCOPYRROLATE 0.2 MG/ML IJ SOLN
0.4000 mg | Freq: Once | INTRAMUSCULAR | Status: AC
  Administered 2019-01-08: 0.4 mg via INTRAVENOUS

## 2019-01-08 MED ORDER — METHOHEXITAL SODIUM 100 MG/10ML IV SOSY
PREFILLED_SYRINGE | INTRAVENOUS | Status: DC | PRN
  Administered 2019-01-08: 70 mg via INTRAVENOUS

## 2019-01-08 MED ORDER — KETOROLAC TROMETHAMINE 30 MG/ML IJ SOLN: 30.0000 mg | Freq: Once | INTRAMUSCULAR | Status: DC

## 2019-01-08 MED ORDER — METHOHEXITAL SODIUM 0.5 G IJ SOLR
INTRAMUSCULAR | Status: AC
  Filled 2019-01-08: qty 500

## 2019-01-08 MED ORDER — ESMOLOL HCL 100 MG/10ML IV SOLN
INTRAVENOUS | Status: DC | PRN
  Administered 2019-01-08: 20 mg via INTRAVENOUS

## 2019-01-08 MED ORDER — GLYCOPYRROLATE 0.2 MG/ML IJ SOLN
INTRAMUSCULAR | Status: AC
  Administered 2019-01-08: 0.4 mg via INTRAVENOUS
  Filled 2019-01-08: qty 2

## 2019-01-08 MED ORDER — KETOROLAC TROMETHAMINE 30 MG/ML IJ SOLN
30.0000 mg | Freq: Once | INTRAMUSCULAR | Status: AC
  Administered 2019-01-08: 30 mg via INTRAVENOUS

## 2019-01-08 MED ORDER — LABETALOL HCL 5 MG/ML IV SOLN
INTRAVENOUS | Status: DC | PRN
  Administered 2019-01-08: 100 mg via INTRAVENOUS
  Administered 2019-01-08: 30 mg via INTRAVENOUS

## 2019-01-08 MED ORDER — SODIUM CHLORIDE 0.9 % IV SOLN
500.0000 mL | Freq: Once | INTRAVENOUS | Status: AC
  Administered 2019-01-08: 500 mL via INTRAVENOUS

## 2019-01-08 MED ORDER — KETOROLAC TROMETHAMINE 30 MG/ML IJ SOLN
INTRAMUSCULAR | Status: AC
  Administered 2019-01-08: 30 mg via INTRAVENOUS
  Filled 2019-01-08: qty 1

## 2019-01-08 NOTE — H&P (Signed)
Melinda [name] is an 48 y.o. female.   Chief Complaint: No significant change from baseline.  Chronic mild dysphoria HPI: History of recurrent severe depression  Past Medical History:  Diagnosis Date  . Depression   . Diabetes mellitus without complication ([place])   . Diabetic peripheral neuropathy ([place]) 03/07/15  . Diabetic peripheral neuropathy ([place]) 03/07/15  . Diabetic peripheral neuropathy ([place]) 03/07/15  . GERD (gastroesophageal reflux disease)   . Hypercholesterolemia 03/07/15  . Hypertension   . Obesity 03/07/15  . Personality disorder ([place]) 03/07/15  . Sinus tachycardia 03/07/15   history of  . Suicidal thoughts 03/07/15    Past Surgical History:  Procedure Laterality Date  . COLONOSCOPY WITH PROPOFOL N/A 12/01/2018   Procedure: COLONOSCOPY WITH PROPOFOL;  Surgeon: Jonathon Bellows, MD;  Location: [facility] ENDOSCOPY;  Service: Gastroenterology;  Laterality: N/A;  . electroconvulsion therapy  03/07/15    Family History  Problem Relation Age of Onset  . Hypertension Father   . Diabetes Mother    Social History:  reports that she has never smoked. She has never used smokeless tobacco. She reports that she does not drink alcohol or use drugs.  Allergies:  Allergies  Allergen Reactions  . Prednisone     Increases blood sugar    (Not in a hospital admission)   Results for orders placed or performed during the hospital encounter of 01/08/19 (from the past 48 hour(s))  Glucose, capillary     Status: Abnormal   Collection Time: 01/08/19 10:10 AM  Result Value Ref Range   Glucose-Capillary 116 (H) 70 - 99 mg/dL   Comment 1 Notify RN    No results found.  Review of Systems  Constitutional: Negative.   HENT: Negative.   Eyes: Negative.   Respiratory: Negative.   Cardiovascular: Negative.   Gastrointestinal: Negative.   Musculoskeletal: Negative.   Skin: Negative.   Neurological: Negative.   Psychiatric/Behavioral: Negative.     Blood pressure (!) 158/81, pulse 92,  temperature 97.8 F (36.6 C), temperature source Oral, height 5\' 4"  (1.626 m), weight 118.8 kg, SpO2 98 %. Physical Exam  Nursing note and vitals reviewed. Constitutional: She appears well-developed and well-nourished.  HENT:  Head: Normocephalic and atraumatic.  Eyes: Pupils are equal, round, and reactive to light. Conjunctivae are normal.  Neck: Normal range of motion.  Cardiovascular: Regular rhythm and normal heart sounds.  Respiratory: Effort normal. No respiratory distress.  GI: Soft.  Musculoskeletal: Normal range of motion.  Neurological: She is alert.  Skin: Skin is warm and dry.  Psychiatric: She has a normal mood and affect. Her behavior is normal. Judgment and thought content normal.     Assessment/Plan Medically stable no new problems.  Continues to benefit from every 2-week ECT  [name], MD 01/08/2019, 10:45 AM

## 2019-01-08 NOTE — Anesthesia Postprocedure Evaluation (Signed)
Anesthesia Post Note  Patient: Lashonta M Saldarriaga  Procedure(s) Performed: ECT TX  Patient location during evaluation: PACU Anesthesia Type: General Level of consciousness: awake and alert Pain management: pain level controlled Vital Signs Assessment: post-procedure vital signs reviewed and stable Respiratory status: spontaneous breathing, nonlabored ventilation and respiratory function stable Cardiovascular status: blood pressure returned to baseline and stable Postop Assessment: no signs of nausea or vomiting Anesthetic complications: no     Last Vitals:  Vitals:   01/08/19 1123 01/08/19 1143  BP: 133/74 121/76  Pulse: 96 89  Resp: (!) 31 16  Temp:  36.8 C  SpO2: 100%     Last Pain:  Vitals:   01/08/19 1143  TempSrc: Oral  PainSc: 0-No pain                 Aislinn Feliz

## 2019-01-08 NOTE — Procedures (Signed)
ECT SERVICES Physician's Interval Evaluation & Treatment Note  Patient Identification: ANGELO CAROLL MRN:  [id number] Date of Evaluation:  01/08/2019 TX #: [id number]  MADRS:   MMSE:   P.E. Findings:  Physical exam same as usual no change no heart or lung problems vitals normal.  Psychiatric Interval Note:  Mild dysphoria no suicidal ideation  Subjective:  Patient is a 48 y.o. female seen for evaluation for Electroconvulsive Therapy. About the same as usual  Treatment Summary:   []   Right Unilateral             [x]  Bilateral   % Energy : 1.0 ms 35%   Impedance: 720 ohms  Seizure Energy Index: 7795 V squared  Postictal Suppression Index: 78%  Seizure Concordance Index: 94%  Medications  Pre Shock: Robinul 0.4 mg labetalol 30 mg esmolol 20 mg Toradol 30 mg Brevital 70 mg succinylcholine 100 mg  Post Shock:    Seizure Duration: 42 seconds EMG 91 seconds EEG   Comments: Follow-up 2 weeks  Lungs:  [x]   Clear to auscultation               []  Other:   Heart:    [x]   Regular rhythm             []  irregular rhythm    [x]   Previous H&P reviewed, patient examined and there are NO CHANGES                 []   Previous H&P reviewed, patient examined and there are changes noted.   [name], MD 2/28/202010:46 AM

## 2019-01-08 NOTE — Anesthesia Post-op Follow-up Note (Signed)
Anesthesia QCDR form completed.        

## 2019-01-08 NOTE — Anesthesia Procedure Notes (Signed)
Date/Time: 01/08/2019 10:45 AM Performed by: Nelda Marseille, CRNA Pre-anesthesia Checklist: Patient identified, Suction available, Emergency Drugs available, Patient being monitored and Timeout performed Oxygen Delivery Method: Simple face mask and Ambu bag Ventilation: Mask ventilation throughout procedure

## 2019-01-08 NOTE — Discharge Instructions (Signed)
1)  The drugs that you have been given will stay in your system until tomorrow so for the       next 24 hours you should not:  A. Drive an automobile  B. Make any legal decisions  C. Drink any alcoholic beverages  2)  You may resume your regular meals upon return home.  3)  A responsible adult must take you home.  Someone should stay with you for a few          hours, then be available by phone for the remainder of the treatment day.  4)  You May experience any of the following symptoms:  Headache, Nausea and a dry mouth (due to the medications you were given),  temporary memory loss and some confusion, or sore muscles (a warm bath  should help this).  If you you experience any of these symptoms let us know on                your return visit.  5)  Report any of the following: any acute discomfort, severe headache, or temperature        greater than 100.5 F.   Also report any unusual redness, swelling, drainage, or pain         at your IV site.    You may report Symptoms to:  [place] at [facility]          Phone: [phone], ECT Department           or Dr. Prescott Gum office [phone]  6)  Your next ECT Treatment is Friday March 13  We will call 2 days prior to your scheduled appointment for arrival times.  7)  Nothing to eat or drink after midnight the night before your procedure.  8)  Take    With a sip of water the morning of your procedure.  9)  Other Instructions: Call [phone] to cancel the morning of your procedure due         to illness or emergency.  10) We will call within 72 hours to assess how you are feeling.

## 2019-01-08 NOTE — Anesthesia Preprocedure Evaluation (Signed)
Anesthesia Evaluation  Patient identified by MRN, date of birth, ID band Patient awake    Reviewed: Allergy & Precautions, H&P , NPO status , Patient's Chart, lab work & pertinent test results  History of Anesthesia Complications Negative for: history of anesthetic complications  Airway Mallampati: II  TM Distance: >3 FB Neck ROM: full    Dental  (+) Poor Dentition, Chipped   Pulmonary sleep apnea , neg COPD,    Pulmonary exam normal breath sounds clear to auscultation       Cardiovascular hypertension, Pt. on medications (-) CAD and (-) Past MI negative cardio ROS Normal cardiovascular exam Rhythm:regular Rate:Normal     Neuro/Psych PSYCHIATRIC DISORDERS Depression Bipolar Disorder  Neuromuscular disease negative neurological ROS     GI/Hepatic negative GI ROS, Neg liver ROS, GERD  Medicated,  Endo/Other  diabetes, Type 2, Oral Hypoglycemic Agents  Renal/GU negative Renal ROS  negative genitourinary   Musculoskeletal   Abdominal (+) + obese,   Peds  Hematology negative hematology ROS (+)   Anesthesia Other Findings Past Medical History: No date: Depression No date: Diabetes mellitus without complication (HCC) 2/56/38: Diabetic peripheral neuropathy ([place]) 03/07/15: Diabetic peripheral neuropathy ([place]) 03/07/15: Diabetic peripheral neuropathy (HCC) No date: GERD (gastroesophageal reflux disease) 03/07/15: Hypercholesterolemia No date: Hypertension 03/07/15: Obesity 03/07/15: Personality disorder 03/07/15: Sinus tachycardia ([place])     Comment: history of 03/07/15: Suicidal thoughts Past Surgical History: 03/07/15: electroconvulsion therapy BMI    Body Mass Index:  36.44 kg/m     Reproductive/Obstetrics                             Anesthesia Physical  Anesthesia Plan  ASA: III  Anesthesia Plan: General   Post-op Pain Management:    Induction: Intravenous  PONV Risk Score and  Plan: 2 and Ondansetron  Airway Management Planned: Mask  Additional Equipment:   Intra-op Plan:   Post-operative Plan:   Informed Consent: I have reviewed the patients History and Physical, chart, labs and discussed the procedure including the risks, benefits and alternatives for the proposed anesthesia with the patient or authorized representative who has indicated his/her understanding and acceptance.     Dental Advisory Given  Plan Discussed with: CRNA and Anesthesiologist  Anesthesia Plan Comments:         Anesthesia Quick Evaluation

## 2019-01-08 NOTE — Transfer of Care (Signed)
Immediate Anesthesia Transfer of Care Note  Patient: Melinda [name]  Procedure(s) Performed: ECT TX  Patient Location: PACU  Anesthesia Type:General  Level of Consciousness: awake and sedated  Airway & Oxygen Therapy: Patient Spontanous Breathing and Patient connected to face mask oxygen  Post-op Assessment: Report given to RN and Post -op Vital signs reviewed and stable  Post vital signs: Reviewed and stable  Last Vitals:  Vitals Value Taken Time  BP    Temp    Pulse    Resp    SpO2      Last Pain:  Vitals:   01/08/19 0845  TempSrc: Oral  PainSc: 0-No pain         Complications: No apparent anesthesia complications

## 2019-01-20 ENCOUNTER — Telehealth: Payer: Self-pay

## 2019-01-21 ENCOUNTER — Other Ambulatory Visit: Payer: Self-pay | Admitting: Psychiatry

## 2019-01-22 ENCOUNTER — Encounter: Payer: Self-pay | Admitting: Certified Registered"

## 2019-01-22 ENCOUNTER — Other Ambulatory Visit: Payer: Self-pay

## 2019-01-22 ENCOUNTER — Encounter
Admission: RE | Admit: 2019-01-22 | Discharge: 2019-01-22 | Disposition: A | Discharge status: Home / Self Care | Payer: Medicare PPO | Source: Ambulatory Visit | Attending: Psychiatry | Admitting: Psychiatry

## 2019-01-22 DIAGNOSIS — Z888 Allergy status to other drugs, medicaments and biological substances status: Secondary | ICD-10-CM | POA: Insufficient documentation

## 2019-01-22 DIAGNOSIS — E1142 Type 2 diabetes mellitus with diabetic polyneuropathy: Secondary | ICD-10-CM | POA: Diagnosis not present

## 2019-01-22 DIAGNOSIS — F329 Major depressive disorder, single episode, unspecified: Secondary | ICD-10-CM | POA: Diagnosis not present

## 2019-01-22 DIAGNOSIS — K219 Gastro-esophageal reflux disease without esophagitis: Secondary | ICD-10-CM | POA: Diagnosis not present

## 2019-01-22 DIAGNOSIS — I10 Essential (primary) hypertension: Secondary | ICD-10-CM | POA: Diagnosis not present

## 2019-01-22 DIAGNOSIS — F332 Major depressive disorder, recurrent severe without psychotic features: Secondary | ICD-10-CM | POA: Diagnosis not present

## 2019-01-22 DIAGNOSIS — E1140 Type 2 diabetes mellitus with diabetic neuropathy, unspecified: Secondary | ICD-10-CM | POA: Diagnosis not present

## 2019-01-22 DIAGNOSIS — G4730 Sleep apnea, unspecified: Secondary | ICD-10-CM | POA: Diagnosis not present

## 2019-01-22 DIAGNOSIS — E7800 Pure hypercholesterolemia, unspecified: Secondary | ICD-10-CM | POA: Diagnosis not present

## 2019-01-22 LAB — GLUCOSE, CAPILLARY
Glucose-Capillary: 107 mg/dL — ABNORMAL HIGH (ref 70–99)
Glucose-Capillary: 135 mg/dL — ABNORMAL HIGH (ref 70–99)

## 2019-01-22 MED ORDER — METHOHEXITAL SODIUM 100 MG/10ML IV SOSY
PREFILLED_SYRINGE | INTRAVENOUS | Status: DC | PRN
  Administered 2019-01-22: 70 mg via INTRAVENOUS

## 2019-01-22 MED ORDER — GLYCOPYRROLATE 0.2 MG/ML IJ SOLN
INTRAMUSCULAR | Status: AC
  Filled 2019-01-22: qty 2

## 2019-01-22 MED ORDER — LABETALOL HCL 5 MG/ML IV SOLN
INTRAVENOUS | Status: AC
  Filled 2019-01-22: qty 4

## 2019-01-22 MED ORDER — SODIUM CHLORIDE 0.9 % IV SOLN
500.0000 mL | Freq: Once | INTRAVENOUS | Status: AC
  Administered 2019-01-22: 500 mL via INTRAVENOUS

## 2019-01-22 MED ORDER — ESMOLOL HCL 100 MG/10ML IV SOLN
INTRAVENOUS | Status: AC
  Filled 2019-01-22: qty 10

## 2019-01-22 MED ORDER — METHOHEXITAL SODIUM 0.5 G IJ SOLR
INTRAMUSCULAR | Status: AC
  Filled 2019-01-22: qty 500

## 2019-01-22 MED ORDER — GLYCOPYRROLATE 0.2 MG/ML IJ SOLN
0.4000 mg | Freq: Once | INTRAMUSCULAR | Status: AC
  Administered 2019-01-22: 0.4 mg via INTRAVENOUS

## 2019-01-22 MED ORDER — LABETALOL HCL 5 MG/ML IV SOLN
INTRAVENOUS | Status: DC | PRN
  Administered 2019-01-22: 30 mg via INTRAVENOUS

## 2019-01-22 MED ORDER — ESMOLOL HCL 100 MG/10ML IV SOLN
INTRAVENOUS | Status: DC | PRN
  Administered 2019-01-22: 20 mg via INTRAVENOUS

## 2019-01-22 MED ORDER — SUCCINYLCHOLINE CHLORIDE 20 MG/ML IJ SOLN
INTRAMUSCULAR | Status: DC | PRN
  Administered 2019-01-22: 100 mg via INTRAVENOUS

## 2019-01-22 NOTE — Anesthesia Post-op Follow-up Note (Signed)
Anesthesia QCDR form completed.        

## 2019-01-22 NOTE — Discharge Instructions (Signed)
1)  The drugs that you have been given will stay in your system until tomorrow so for the       next 24 hours you should not:  A. Drive an automobile  B. Make any legal decisions  C. Drink any alcoholic beverages  2)  You may resume your regular meals upon return home.  3)  A responsible adult must take you home.  Someone should stay with you for a few          hours, then be available by phone for the remainder of the treatment day.  4)  You May experience any of the following symptoms:  Headache, Nausea and a dry mouth (due to the medications you were given),  temporary memory loss and some confusion, or sore muscles (a warm bath  should help this).  If you you experience any of these symptoms let us know on                your return visit.  5)  Report any of the following: any acute discomfort, severe headache, or temperature        greater than 100.5 F.   Also report any unusual redness, swelling, drainage, or pain         at your IV site.    You may report Symptoms to:  [place] [facility]          Phone: [phone], ECT Department           or Dr. Prescott Gum office [phone]  6)  Your next ECT Treatment is Friday March 27   We will call 2 days prior to your scheduled appointment for arrival times.  7)  Nothing to eat or drink after midnight the night before your procedure.  8)  Take     With a sip of water the morning of your procedure.  9)  Other Instructions: Call [phone] to cancel the morning of your procedure due         to illness or emergency.  10) We will call within 72 hours to assess how you are feeling.

## 2019-01-22 NOTE — Anesthesia Procedure Notes (Signed)
Procedure Name: MAC Date/Time: 01/22/2019 11:06 AM Performed by: Chanetta Marshall, CRNA Pre-anesthesia Checklist: Patient identified, Emergency Drugs available, Suction available, Patient being monitored and Timeout performed Patient Re-evaluated:Patient Re-evaluated prior to induction Oxygen Delivery Method: Circle system utilized Preoxygenation: Pre-oxygenation with 100% oxygen Induction Type: IV induction Ventilation: Mask ventilation without difficulty Airway Equipment and Method: Bite block Dental Injury: Teeth and Oropharynx as per pre-operative assessment

## 2019-01-22 NOTE — H&P (Signed)
Melinda [name] is an 48 y.o. female.   Chief Complaint: Chronic depression and anxiety no different than usual HPI: History of recurrent severe depression with good partial response to maintenance ECT  Past Medical History:  Diagnosis Date  . Depression   . Diabetes mellitus without complication ([place])   . Diabetic peripheral neuropathy ([place]) 03/07/15  . Diabetic peripheral neuropathy ([place]) 03/07/15  . Diabetic peripheral neuropathy ([place]) 03/07/15  . GERD (gastroesophageal reflux disease)   . Hypercholesterolemia 03/07/15  . Hypertension   . Obesity 03/07/15  . Personality disorder ([place]) 03/07/15  . Sinus tachycardia 03/07/15   history of  . Suicidal thoughts 03/07/15    Past Surgical History:  Procedure Laterality Date  . COLONOSCOPY WITH PROPOFOL N/A 12/01/2018   Procedure: COLONOSCOPY WITH PROPOFOL;  Surgeon: Jonathon Bellows, MD;  [facility] ENDOSCOPY;  Service: Gastroenterology;  Laterality: N/A;  . electroconvulsion therapy  03/07/15    Family History  Problem Relation Age of Onset  . Hypertension Father   . Diabetes Mother    Social History:  reports that she has never smoked. She has never used smokeless tobacco. She reports that she does not drink alcohol or use drugs.  Allergies:  Allergies  Allergen Reactions  . Prednisone     Increases blood sugar    (Not in a hospital admission)   Results for orders placed or performed during the hospital encounter of 01/22/19 (from the past 48 hour(s))  Glucose, capillary     Status: Abnormal   Collection Time: 01/22/19  9:03 AM  Result Value Ref Range   Glucose-Capillary 107 (H) 70 - 99 mg/dL   No results found.  Review of Systems  Constitutional: Negative.   HENT: Negative.   Eyes: Negative.   Respiratory: Negative.   Cardiovascular: Negative.   Gastrointestinal: Negative.   Musculoskeletal: Negative.   Skin: Negative.   Neurological: Negative.   Psychiatric/Behavioral: Negative.     Blood pressure 135/84, pulse  (!) 0, temperature 98.3 F (36.8 C), temperature source Oral, resp. rate 16, height 5\' 4"  (1.626 m), weight 117 kg, last menstrual period 01/09/2018, SpO2 99 %. Physical Exam  Nursing note and vitals reviewed. Constitutional: She appears well-developed and well-nourished.  HENT:  Head: Normocephalic and atraumatic.  Eyes: Pupils are equal, round, and reactive to light. Conjunctivae are normal.  Neck: Normal range of motion.  Cardiovascular: Regular rhythm and normal heart sounds.  Respiratory: Effort normal.  GI: Soft.  Musculoskeletal: Normal range of motion.  Neurological: She is alert.  Skin: Skin is warm and dry.  Psychiatric: She has a normal mood and affect. Her behavior is normal. Judgment and thought content normal.     Assessment/Plan Bilateral ECT today.  Supportive therapy and counseling.  Follow-up 2 weeks.  [name], MD 01/22/2019, 10:59 AM

## 2019-01-22 NOTE — Anesthesia Preprocedure Evaluation (Signed)
Anesthesia Evaluation  Patient identified by MRN, date of birth, ID band Patient awake    Reviewed: Allergy & Precautions, H&P , NPO status , Patient's Chart, lab work & pertinent test results  History of Anesthesia Complications Negative for: history of anesthetic complications  Airway Mallampati: II  TM Distance: >3 FB Neck ROM: full    Dental  (+) Poor Dentition, Chipped   Pulmonary sleep apnea , neg COPD,    Pulmonary exam normal breath sounds clear to auscultation       Cardiovascular hypertension, Pt. on medications (-) CAD and (-) Past MI negative cardio ROS Normal cardiovascular exam Rhythm:regular Rate:Normal     Neuro/Psych PSYCHIATRIC DISORDERS Depression Bipolar Disorder  Neuromuscular disease negative neurological ROS     GI/Hepatic negative GI ROS, Neg liver ROS, GERD  Medicated,  Endo/Other  diabetes, Type 2, Oral Hypoglycemic Agents  Renal/GU negative Renal ROS  negative genitourinary   Musculoskeletal   Abdominal (+) + obese,   Peds  Hematology negative hematology ROS (+)   Anesthesia Other Findings Past Medical History: No date: Depression No date: Diabetes mellitus without complication (HCC) 2/56/38: Diabetic peripheral neuropathy ([place]) 03/07/15: Diabetic peripheral neuropathy ([place]) 03/07/15: Diabetic peripheral neuropathy (HCC) No date: GERD (gastroesophageal reflux disease) 03/07/15: Hypercholesterolemia No date: Hypertension 03/07/15: Obesity 03/07/15: Personality disorder 03/07/15: Sinus tachycardia ([place])     Comment: history of 03/07/15: Suicidal thoughts Past Surgical History: 03/07/15: electroconvulsion therapy BMI    Body Mass Index:  36.44 kg/m     Reproductive/Obstetrics                             Anesthesia Physical  Anesthesia Plan  ASA: III  Anesthesia Plan: General   Post-op Pain Management:    Induction: Intravenous  PONV Risk Score and  Plan: 2 and Ondansetron  Airway Management Planned: Mask  Additional Equipment:   Intra-op Plan:   Post-operative Plan:   Informed Consent: I have reviewed the patients History and Physical, chart, labs and discussed the procedure including the risks, benefits and alternatives for the proposed anesthesia with the patient or authorized representative who has indicated his/her understanding and acceptance.     Dental Advisory Given  Plan Discussed with: CRNA and Anesthesiologist  Anesthesia Plan Comments:         Anesthesia Quick Evaluation

## 2019-01-22 NOTE — Procedures (Signed)
ECT SERVICES Physician's Interval Evaluation & Treatment Note  Patient Identification: Melinda [name] MRN:  [id number] Date of Evaluation:  01/22/2019 TX #: [id number]  MADRS:   MMSE:   P.E. Findings:  No change to physical exam  Psychiatric Interval Note:  Mood stable.  No worse.  No suicidal thoughts.  Subjective:  Patient is a 48 y.o. female seen for evaluation for Electroconvulsive Therapy. No physical complaints.  Treatment Summary:   []   Right Unilateral             [x]  Bilateral   % Energy :1.0 ms 35%   Impedance: 860 ohms  Seizure Energy Index: 4182 V squared  Postictal Suppression Index: 82%  Seizure Concordance Index: 96%  Medications  Pre Shock: Robinul 0.4 mg labetalol 30 mg esmolol 20 mg Toradol 30 mg Brevital 70 mg succinylcholine 100 mg  Post Shock:    Seizure Duration: 36 seconds EMG 60 seconds EEG   Comments: Follow-up in 2 weeks  Lungs:  [x]   Clear to auscultation               []  Other:   Heart:    [x]   Regular rhythm             []  irregular rhythm    [x]   Previous H&P reviewed, patient examined and there are NO CHANGES                 []   Previous H&P reviewed, patient examined and there are changes noted.   [name], MD 3/13/202011:00 AM

## 2019-01-22 NOTE — Transfer of Care (Signed)
Immediate Anesthesia Transfer of Care Note  Patient: Melinda [name]  Procedure(s) Performed: ECT TX  Patient Location: PACU  Anesthesia Type:General  Level of Consciousness: awake, alert  and oriented  Airway & Oxygen Therapy: Patient Spontanous Breathing and Patient connected to face mask oxygen  Post-op Assessment: Report given to RN and Post -op Vital signs reviewed and stable  Post vital signs: Reviewed and stable  Last Vitals:  Vitals Value Taken Time  BP    Temp    Pulse    Resp    SpO2      Last Pain:  Vitals:   01/22/19 0821  TempSrc: Oral  PainSc: 0-No pain         Complications: No apparent anesthesia complications

## 2019-01-24 NOTE — Anesthesia Postprocedure Evaluation (Signed)
Anesthesia Post Note  Patient: [name]  Procedure(s) Performed: ECT TX  Patient location during evaluation: PACU Anesthesia Type: General Level of consciousness: awake and alert Pain management: pain level controlled Vital Signs Assessment: post-procedure vital signs reviewed and stable Respiratory status: spontaneous breathing, nonlabored ventilation, respiratory function stable and patient connected to nasal cannula oxygen Cardiovascular status: blood pressure returned to baseline and stable Postop Assessment: no apparent nausea or vomiting Anesthetic complications: no     Last Vitals:  Vitals:   01/22/19 1136 01/22/19 1151  BP:  130/79  Pulse:  81  Resp:  16  Temp: 36.6 C 36.6 C  SpO2:      Last Pain:  Vitals:   01/22/19 1151  TempSrc: Oral  PainSc: 0-No pain                 Martha Clan

## 2019-02-03 ENCOUNTER — Telehealth: Payer: Self-pay

## 2019-02-03 NOTE — Telephone Encounter (Signed)
Called pt to see if we could change her AWV on 02/11/19 @ 8:40 AM to a telephone call vs an in patient visit due to COVID-19 restrictions. Also need to verify what number to call to leave in apt notes.  -MM

## 2019-02-04 ENCOUNTER — Other Ambulatory Visit: Payer: Self-pay | Admitting: Psychiatry

## 2019-02-05 ENCOUNTER — Encounter (HOSPITAL_BASED_OUTPATIENT_CLINIC_OR_DEPARTMENT_OTHER)
Admission: RE | Admit: 2019-02-05 | Discharge: 2019-02-05 | Disposition: A | Discharge status: Home / Self Care | Payer: Medicare PPO | Source: Ambulatory Visit | Attending: Psychiatry | Admitting: Psychiatry

## 2019-02-05 ENCOUNTER — Telehealth: Payer: Self-pay | Admitting: Physician Assistant

## 2019-02-05 ENCOUNTER — Encounter: Payer: Self-pay | Admitting: Registered Nurse

## 2019-02-05 ENCOUNTER — Other Ambulatory Visit: Payer: Self-pay

## 2019-02-05 DIAGNOSIS — F332 Major depressive disorder, recurrent severe without psychotic features: Secondary | ICD-10-CM | POA: Diagnosis not present

## 2019-02-05 DIAGNOSIS — F329 Major depressive disorder, single episode, unspecified: Secondary | ICD-10-CM | POA: Diagnosis not present

## 2019-02-05 DIAGNOSIS — I10 Essential (primary) hypertension: Secondary | ICD-10-CM | POA: Diagnosis not present

## 2019-02-05 DIAGNOSIS — E1142 Type 2 diabetes mellitus with diabetic polyneuropathy: Secondary | ICD-10-CM | POA: Diagnosis not present

## 2019-02-05 DIAGNOSIS — E7800 Pure hypercholesterolemia, unspecified: Secondary | ICD-10-CM | POA: Diagnosis not present

## 2019-02-05 DIAGNOSIS — E119 Type 2 diabetes mellitus without complications: Secondary | ICD-10-CM

## 2019-02-05 DIAGNOSIS — G4730 Sleep apnea, unspecified: Secondary | ICD-10-CM | POA: Diagnosis not present

## 2019-02-05 DIAGNOSIS — K219 Gastro-esophageal reflux disease without esophagitis: Secondary | ICD-10-CM | POA: Diagnosis not present

## 2019-02-05 DIAGNOSIS — Z888 Allergy status to other drugs, medicaments and biological substances status: Secondary | ICD-10-CM | POA: Diagnosis not present

## 2019-02-05 LAB — GLUCOSE, CAPILLARY: Glucose-Capillary: 105 mg/dL — ABNORMAL HIGH (ref 70–99)

## 2019-02-05 MED ORDER — SUCCINYLCHOLINE CHLORIDE 20 MG/ML IJ SOLN
INTRAMUSCULAR | Status: AC
  Filled 2019-02-05: qty 1

## 2019-02-05 MED ORDER — ONDANSETRON HCL 4 MG/2ML IJ SOLN: 4.0000 mg | Freq: Once | INTRAMUSCULAR | Status: DC | PRN

## 2019-02-05 MED ORDER — GLYCOPYRROLATE 0.2 MG/ML IJ SOLN
0.4000 mg | Freq: Once | INTRAMUSCULAR | Status: AC
  Administered 2019-02-05: 0.4 mg via INTRAVENOUS

## 2019-02-05 MED ORDER — SUCCINYLCHOLINE CHLORIDE 20 MG/ML IJ SOLN
INTRAMUSCULAR | Status: DC | PRN
  Administered 2019-02-05: 100 mg via INTRAVENOUS

## 2019-02-05 MED ORDER — ATORVASTATIN CALCIUM 10 MG PO TABS: 10.0000 mg | ORAL_TABLET | Freq: Every day | ORAL | 1 refills | Status: DC

## 2019-02-05 MED ORDER — ESMOLOL HCL 100 MG/10ML IV SOLN
INTRAVENOUS | Status: DC | PRN
  Administered 2019-02-05: 20 mg via INTRAVENOUS

## 2019-02-05 MED ORDER — LABETALOL HCL 5 MG/ML IV SOLN
INTRAVENOUS | Status: DC | PRN
  Administered 2019-02-05: 30 mg via INTRAVENOUS

## 2019-02-05 MED ORDER — KETOROLAC TROMETHAMINE 30 MG/ML IJ SOLN: 30.0000 mg | Freq: Once | INTRAMUSCULAR | Status: DC

## 2019-02-05 MED ORDER — GLYCOPYRROLATE 0.2 MG/ML IJ SOLN
INTRAMUSCULAR | Status: AC
  Filled 2019-02-05: qty 2

## 2019-02-05 MED ORDER — LABETALOL HCL 5 MG/ML IV SOLN
INTRAVENOUS | Status: AC
  Filled 2019-02-05: qty 4

## 2019-02-05 MED ORDER — ESMOLOL HCL 100 MG/10ML IV SOLN
INTRAVENOUS | Status: AC
  Filled 2019-02-05: qty 10

## 2019-02-05 MED ORDER — SODIUM CHLORIDE 0.9 % IV SOLN
INTRAVENOUS | Status: DC | PRN
  Administered 2019-02-05: 10:00:00 via INTRAVENOUS

## 2019-02-05 MED ORDER — METHOHEXITAL SODIUM 100 MG/10ML IV SOSY
PREFILLED_SYRINGE | INTRAVENOUS | Status: DC | PRN
  Administered 2019-02-05: 70 mg via INTRAVENOUS

## 2019-02-05 MED ORDER — SODIUM CHLORIDE 0.9 % IV SOLN
500.0000 mL | Freq: Once | INTRAVENOUS | Status: AC
  Administered 2019-02-05: 500 mL via INTRAVENOUS

## 2019-02-05 NOTE — Telephone Encounter (Signed)
Index faxed refill request for the following medications:  atorvastatin (LIPITOR) 10 MG tablet    Please advise.

## 2019-02-05 NOTE — Anesthesia Procedure Notes (Signed)
Performed by: Nolen Lindamood, CRNA Pre-anesthesia Checklist: Patient identified, Emergency Drugs available, Suction available and Patient being monitored Patient Re-evaluated:Patient Re-evaluated prior to induction Oxygen Delivery Method: Circle system utilized Preoxygenation: Pre-oxygenation with 100% oxygen Induction Type: IV induction Ventilation: Mask ventilation without difficulty and Mask ventilation throughout procedure Airway Equipment and Method: Bite block Placement Confirmation: positive ETCO2 Dental Injury: Teeth and Oropharynx as per pre-operative assessment        

## 2019-02-05 NOTE — H&P (Signed)
Melinda [name] is an 48 y.o. female.   Chief Complaint: Patient has chronic dysphoria but not severely depressed.  Not acutely suicidal. HPI: History of recurrent severe depression and self injury negativity  Past Medical History:  Diagnosis Date  . Depression   . Diabetes mellitus without complication ([place])   . Diabetic peripheral neuropathy ([place]) 03/07/15  . Diabetic peripheral neuropathy ([place]) 03/07/15  . Diabetic peripheral neuropathy ([place]) 03/07/15  . GERD (gastroesophageal reflux disease)   . Hypercholesterolemia 03/07/15  . Hypertension   . Obesity 03/07/15  . Personality disorder ([place]) 03/07/15  . Sinus tachycardia 03/07/15   history of  . Suicidal thoughts 03/07/15    Past Surgical History:  Procedure Laterality Date  . COLONOSCOPY WITH PROPOFOL N/A 12/01/2018   Procedure: COLONOSCOPY WITH PROPOFOL;  Surgeon: Jonathon Bellows, MD;  Location: [facility] ENDOSCOPY;  Service: Gastroenterology;  Laterality: N/A;  . electroconvulsion therapy  03/07/15    Family History  Problem Relation Age of Onset  . Hypertension Father   . Diabetes Mother    Social History:  reports that she has never smoked. She has never used smokeless tobacco. She reports that she does not drink alcohol or use drugs.  Allergies:  Allergies  Allergen Reactions  . Prednisone     Increases blood sugar    (Not in a hospital admission)   Results for orders placed or performed during the hospital encounter of 02/05/19 (from the past 48 hour(s))  Glucose, capillary     Status: Abnormal   Collection Time: 02/05/19  9:41 AM  Result Value Ref Range   Glucose-Capillary 105 (H) 70 - 99 mg/dL   No results found.  Review of Systems  Constitutional: Negative.   HENT: Negative.   Eyes: Negative.   Respiratory: Negative.   Cardiovascular: Negative.   Gastrointestinal: Negative.   Musculoskeletal: Negative.   Skin: Negative.   Neurological: Negative.   Psychiatric/Behavioral: Negative.     Blood pressure (!)  155/99, pulse (!) 105, temperature 98.1 F (36.7 C), temperature source Oral, resp. rate 16, SpO2 100 %. Physical Exam  Nursing note and vitals reviewed. Constitutional: She appears well-developed and well-nourished.  HENT:  Head: Normocephalic and atraumatic.  Eyes: Pupils are equal, round, and reactive to light. Conjunctivae are normal.  Neck: Normal range of motion.  Cardiovascular: Regular rhythm and normal heart sounds.  Respiratory: Effort normal.  GI: Soft.  Musculoskeletal: Normal range of motion.  Neurological: She is alert.  Skin: Skin is warm and dry.  Psychiatric: She has a normal mood and affect. Her behavior is normal. Judgment and thought content normal.     Assessment/Plan ECT today and I think we can safely wait 3 weeks for our follow-up treatment given the current virus situation.  [name], MD 02/05/2019, 10:30 AM

## 2019-02-05 NOTE — Procedures (Signed)
ECT SERVICES Physician's Interval Evaluation & Treatment Note  Patient Identification: Melinda [name] MRN:  [id number] Date of Evaluation:  02/05/2019 TX #: 4  MADRS: 13  MMSE: 29  P.E. Findings:  No change physical exam  Psychiatric Interval Note:  Sad and masochistic but no change from baseline  Subjective:  Patient is a 48 y.o. female seen for evaluation for Electroconvulsive Therapy. No suicidal ideation  Treatment Summary:   []   Right Unilateral             [x]  Bilateral   % Energy : 1.0 ms 35%   Impedance: 930 ohms  Seizure Energy Index: 7177 V squared  Postictal Suppression Index: 92%  Seizure Concordance Index: 97%  Medications  Pre Shock: Robinul 0.4 mg Toradol 30 mg labetalol 30 mg esmolol 20 mg Brevital 70 mg succinylcholine 100 mg  Post Shock:    Seizure Duration: 34 seconds EMG 82 seconds EEG   Comments: Follow-up in 3 weeks  Lungs:  [x]   Clear to auscultation               []  Other:   Heart:    [x]   Regular rhythm             []  irregular rhythm    [x]   Previous H&P reviewed, patient examined and there are NO CHANGES                 []   Previous H&P reviewed, patient examined and there are changes noted.   [name], MD 3/27/202010:32 AM

## 2019-02-05 NOTE — Transfer of Care (Signed)
Immediate Anesthesia Transfer of Care Note  Patient: Melinda [name]  Procedure(s) Performed: ECT TX  Patient Location: PACU  Anesthesia Type:General  Level of Consciousness: sedated  Airway & Oxygen Therapy: Patient Spontanous Breathing and Patient connected to face mask oxygen  Post-op Assessment: Report given to RN and Post -op Vital signs reviewed and stable  Post vital signs: Reviewed and stable  Last Vitals:  Vitals Value Taken Time  BP    Temp 36.7 C 02/05/2019 10:47 AM  Pulse 100 02/05/2019 10:47 AM  Resp 20 02/05/2019 10:47 AM  SpO2 100 % 02/05/2019 10:47 AM    Last Pain:  Vitals:   02/05/19 1047  TempSrc:   PainSc: 0-No pain         Complications: No apparent anesthesia complications

## 2019-02-05 NOTE — Anesthesia Post-op Follow-up Note (Signed)
Anesthesia QCDR form completed.        

## 2019-02-05 NOTE — Anesthesia Preprocedure Evaluation (Signed)
Anesthesia Evaluation  Patient identified by MRN, date of birth, ID band Patient awake    Reviewed: Allergy & Precautions, H&P , NPO status , Patient's Chart, lab work & pertinent test results  History of Anesthesia Complications Negative for: history of anesthetic complications  Airway Mallampati: II  TM Distance: >3 FB Neck ROM: full    Dental  (+) Poor Dentition, Chipped   Pulmonary sleep apnea , neg COPD,    Pulmonary exam normal breath sounds clear to auscultation       Cardiovascular hypertension, Pt. on medications (-) CAD and (-) Past MI negative cardio ROS Normal cardiovascular exam Rhythm:regular Rate:Normal     Neuro/Psych PSYCHIATRIC DISORDERS Depression Bipolar Disorder  Neuromuscular disease negative neurological ROS     GI/Hepatic negative GI ROS, Neg liver ROS, GERD  Medicated,  Endo/Other  diabetes, Type 2, Oral Hypoglycemic Agents  Renal/GU negative Renal ROS  negative genitourinary   Musculoskeletal   Abdominal (+) + obese,   Peds  Hematology negative hematology ROS (+)   Anesthesia Other Findings     Reproductive/Obstetrics                             Anesthesia Physical  Anesthesia Plan  ASA: III  Anesthesia Plan: General   Post-op Pain Management:    Induction: Intravenous  PONV Risk Score and Plan: 2 and Ondansetron  Airway Management Planned: Mask  Additional Equipment:   Intra-op Plan:   Post-operative Plan:   Informed Consent: I have reviewed the patients History and Physical, chart, labs and discussed the procedure including the risks, benefits and alternatives for the proposed anesthesia with the patient or authorized representative who has indicated his/her understanding and acceptance.     Dental Advisory Given  Plan Discussed with: CRNA and Anesthesiologist  Anesthesia Plan Comments:         Anesthesia Quick Evaluation

## 2019-02-05 NOTE — Anesthesia Postprocedure Evaluation (Signed)
Anesthesia Post Note  Patient: Melinda [name]  Procedure(s) Performed: ECT TX  Patient location during evaluation: PACU Anesthesia Type: General Level of consciousness: awake and alert Pain management: pain level controlled Vital Signs Assessment: post-procedure vital signs reviewed and stable Respiratory status: spontaneous breathing and respiratory function stable Cardiovascular status: stable Anesthetic complications: no     Last Vitals:  Vitals:   02/05/19 1106 02/05/19 1119  BP: 131/83 (!) 147/89  Pulse:  81  Resp: (!) 24 18  Temp:    SpO2:      Last Pain:  Vitals:   02/05/19 1119  TempSrc:   PainSc: 0-No pain                 Heavin Sebree K

## 2019-02-05 NOTE — Discharge Instructions (Signed)
1)  The drugs that you have been given will stay in your system until tomorrow so for the       next 24 hours you should not:  A. Drive an automobile  B. Make any legal decisions  C. Drink any alcoholic beverages  2)  You may resume your regular meals upon return home.  3)  A responsible adult must take you home.  Someone should stay with you for a few          hours, then be available by phone for the remainder of the treatment day.  4)  You May experience any of the following symptoms:  Headache, Nausea and a dry mouth (due to the medications you were given),  temporary memory loss and some confusion, or sore muscles (a warm bath  should help this).  If you you experience any of these symptoms let us know on                your return visit.  5)  Report any of the following: any acute discomfort, severe headache, or temperature        greater than 100.5 F.   Also report any unusual redness, swelling, drainage, or pain         at your IV site.    You may report Symptoms to:  [place] [facility]          Phone: [phone], ECT Department           or Dr. Prescott Gum office [phone]  6)  Your next ECT Treatment is Day Friday  Date February 26, 2019  We will call 2 days prior to your scheduled appointment for arrival times.  7)  Nothing to eat or drink after midnight the night before your procedure.  8)  Take lisnopril   With a sip of water the morning of your procedure.  9)  Other Instructions: Call [phone] to cancel the morning of your procedure due         to illness or emergency.  10) We will call within 72 hours to assess how you are feeling.

## 2019-02-09 ENCOUNTER — Telehealth: Payer: Self-pay | Admitting: Physician Assistant

## 2019-02-09 NOTE — Telephone Encounter (Signed)
Pt r/c and stated she cannot completed a virtual visit but still wants to come in the office to see Fabio Bering on the same day (02/11/19) @ 8:20 AM. Cancelled AWV and r/s for 04/26/19 @ 10:40 AM.  -MM

## 2019-02-09 NOTE — Telephone Encounter (Signed)
LMTCB. Guidelines have changed again, need to inquire about if apt can be changed to a virtual visit.  -MM

## 2019-02-09 NOTE — Telephone Encounter (Signed)
Can we reschedule appointment for one month?

## 2019-02-10 NOTE — Telephone Encounter (Signed)
LMTCB to reschedule appt  

## 2019-02-11 ENCOUNTER — Ambulatory Visit: Payer: Self-pay

## 2019-02-11 ENCOUNTER — Other Ambulatory Visit: Payer: Self-pay

## 2019-02-11 ENCOUNTER — Ambulatory Visit (INDEPENDENT_AMBULATORY_CARE_PROVIDER_SITE_OTHER): Payer: Medicare PPO | Admitting: Physician Assistant

## 2019-02-11 VITALS — BP 116/81 | HR 107 | Temp 98.1°F | Resp 18 | Wt 254.2 lb

## 2019-02-11 DIAGNOSIS — E1159 Type 2 diabetes mellitus with other circulatory complications: Secondary | ICD-10-CM | POA: Diagnosis not present

## 2019-02-11 DIAGNOSIS — E1169 Type 2 diabetes mellitus with other specified complication: Secondary | ICD-10-CM | POA: Diagnosis not present

## 2019-02-11 DIAGNOSIS — E119 Type 2 diabetes mellitus without complications: Secondary | ICD-10-CM | POA: Diagnosis not present

## 2019-02-11 DIAGNOSIS — E785 Hyperlipidemia, unspecified: Secondary | ICD-10-CM | POA: Diagnosis not present

## 2019-02-11 DIAGNOSIS — I10 Essential (primary) hypertension: Secondary | ICD-10-CM

## 2019-02-11 LAB — POCT GLYCOSYLATED HEMOGLOBIN (HGB A1C): Hemoglobin A1C: 7.1 % — AB (ref 4.0–5.6)

## 2019-02-11 MED ORDER — METFORMIN HCL ER 500 MG PO TB24: ORAL_TABLET | ORAL | 0 refills | Status: DC

## 2019-02-11 NOTE — Progress Notes (Signed)
Patient: Melinda [name] Female    DOB: [birth year]   48 y.o.   MRN: [id number] Visit Date: 02/11/2019  Today's Provider: Trinna Post, PA-C   Chief Complaint  Patient presents with  . Diabetes  . Hypertension  . Hyperlipidemia   Subjective:     HPI   HTN: She is taking lisinopril 10 mg daily. She is doing well with this, her blood pressure is controlled on this medication and she has no problems taking this medication.   BP Readings from Last 3 Encounters:  02/11/19 116/81  12/01/18 122/75  11/12/18 (!) 143/90   Diabetes Mellitus II   She is currently taking actos 15 mg daily. It is listed in her record as 30 mg daily but she reports she is cutting the pills in half. She has been on this for many years, since the time she was seeing Dr. Gabriel Carina with endocrinology. She is not sure why she is on this, had some difficulty affording medications but does not recall intolerance or allergy to metformin. From chart review she was previously on Januvia as well in 2015. She reports she is not aware of increased risk of bladder cancer with long term use of this medication.  She continues to eat poorly. She reports donuts every morning at 5:00 AM but is now eating pancakes because she ran out of donuts. She will also make fried chicken every Thursday as this is her favorite food. Drinks sodas, sweet tea, eats pizza and other sugar snacks. She reports these foods have sugar in them but she thinks it is fine because her sugars have been controlled. She brings her meter with her today and fasting sugars have been averaging 120's. She is UTD on eye and foot exam.   Lab Results  Component Value Date   HGBA1C 7.1 (A) 02/11/2019   HLD: Taking lipitor 10 mg daily without issue.   Lipid Panel     Component Value Date/Time   CHOL 160 10/06/2018 1035   TRIG 96 10/06/2018 1035   HDL 54 10/06/2018 1035   CHOLHDL 3.0 10/06/2018 1035   LDLCALC 87 10/06/2018 1035    Morbid Obesity:  Wt  Readings from Last 3 Encounters:  02/11/19 254 lb 3.2 oz (115.3 kg)  11/12/18 261 lb 3.2 oz (118.5 kg)  10/06/18 263 lb (119.3 kg)   Depression:   Currently on geodon 80 mg daily and wellbutrin 300 mg daily for depression. She continues every other week treatments with Dr. Weber Cooks. Her next treatment has been delayed slightly due to COVID-19 and she reports anxiety about this.   Allergies  Allergen Reactions  . Prednisone     Increases blood sugar     Current Outpatient Medications:  .  atorvastatin (LIPITOR) 10 MG tablet, Take 1 tablet (10 mg total) by mouth at bedtime., Disp: 90 tablet, Rfl: 1 .  buPROPion (WELLBUTRIN XL) 300 MG 24 hr tablet, TAKE 1 TABLET EVERY DAY, Disp: 90 tablet, Rfl: 0 .  escitalopram (LEXAPRO) 20 MG tablet, TAKE 1 TABLET (20 MG TOTAL) BY MOUTH DAILY., Disp: 90 tablet, Rfl: 0 .  Lancets Misc. (ACCU-CHEK FASTCLIX LANCET) KIT, , Disp: , Rfl:  .  lisinopril (PRINIVIL,ZESTRIL) 10 MG tablet, TAKE 1 TABLET EVERY DAY, Disp: 90 tablet, Rfl: 0 .  ziprasidone (GEODON) 80 MG capsule, TAKE 1 CAPSULE TWO TIMES DAILY WITH A MEAL., Disp: 180 capsule, Rfl: 0 .  amoxicillin (AMOXIL) 500 MG capsule, Take 500 mg by mouth 3 (  three) times daily. Take 1 capsule by mouth three times daily for 10 days., Disp: , Rfl:  .  metFORMIN (GLUCOPHAGE XR) 500 MG 24 hr tablet, Take 1 tablet (500 mg total) by mouth daily with breakfast for 14 days, THEN 1 tablet (500 mg total) 2 (two) times daily., Disp: 194 tablet, Rfl: 0  Review of Systems  All other systems reviewed and are negative.   Social History   Tobacco Use  . Smoking status: Never Smoker  . Smokeless tobacco: Never Used  Substance Use Topics  . Alcohol use: No      Objective:   BP 116/81 (BP Location: Right Arm, Patient Position: Sitting, Cuff Size: Large)   Pulse (!) 107   Temp 98.1 F (36.7 C) (Oral)   Resp 18   Wt 254 lb 3.2 oz (115.3 kg)   LMP  (LMP Unknown) Comment: pt states she has not had period in the past year   SpO2 97%   BMI 43.63 kg/m  Vitals:   02/11/19 0841  BP: 116/81  Pulse: (!) 107  Resp: 18  Temp: 98.1 F (36.7 C)  TempSrc: Oral  SpO2: 97%  Weight: 254 lb 3.2 oz (115.3 kg)     Physical Exam Constitutional:      Appearance: Normal appearance.  Cardiovascular:     Rate and Rhythm: Normal rate and regular rhythm.  Pulmonary:     Effort: Pulmonary effort is normal. No respiratory distress.     Breath sounds: Normal breath sounds.  Skin:    General: Skin is warm and dry.  Neurological:     Mental Status: She is alert and oriented to person, place, and time. Mental status is at baseline.  Psychiatric:        Mood and Affect: Mood normal.        Behavior: Behavior normal.         Assessment & Plan    1. Diabetes mellitus without complication ([place])  She has been on pioglitazone for at least five documented years with no documented intolerance or allergy to metformin and also does not recall issue with this. Normal kidney function to support this. Counseled on increased risk of bladder cancer with prolonged use and possibly dose dependent manner. Since she does not have any contraindications to metformin, I will switch her as below. I am referring to CCM for education on her disease process. She has been counseled extensively by endocrinology per chart review. I do wonder if patient has cognitive ability to understand the consequences of poor diet but she is able to recognize that the foods she eats are high in sugar but she rationalizes this with her the fact that her sugars are controlled. Her A1c is 7.1% today which is up from 6.9% last check. Counseled she needs to modify her dietary habits and we will need to adjust medication if A1c continues to increase. Follow up in 3 months.  - POCT HgB A1C - metFORMIN (GLUCOPHAGE XR) 500 MG 24 hr tablet; Take 1 tablet (500 mg total) by mouth daily with breakfast for 14 days, THEN 1 tablet (500 mg total) 2 (two) times daily.  Dispense:  194 tablet; Refill: 0 - Ambulatory referral to Chronic Care Management Services  2. Hypertension associated with diabetes ([place])  - Ambulatory referral to Chronic Care Management Services  3. Hyperlipidemia associated with type 2 diabetes mellitus ([place])  - Ambulatory referral to Chronic Care Management Services  The entirety of the information documented in the History  of Present Illness, Review of Systems and Physical Exam were personally obtained by me. Portions of this information were initially documented by Doran Clay, CMA and reviewed by me for thoroughness and accuracy.          Trinna Post, PA-C  [facility]

## 2019-02-11 NOTE — Patient Instructions (Signed)
Diabetes Mellitus and Exercise Exercising regularly is important for your overall health, especially when you have diabetes (diabetes mellitus). Exercising is not only about losing weight. It has many other health benefits, such as increasing muscle strength and bone density and reducing body fat and stress. This leads to improved fitness, flexibility, and endurance, all of which result in better overall health. Exercise has additional benefits for people with diabetes, including:  Reducing appetite.  Helping to lower and control blood glucose.  Lowering blood pressure.  Helping to control amounts of fatty substances (lipids) in the blood, such as cholesterol and triglycerides.  Helping the body to respond better to insulin (improving insulin sensitivity).  Reducing how much insulin the body needs.  Decreasing the risk for heart disease by: ? Lowering cholesterol and triglyceride levels. ? Increasing the levels of good cholesterol. ? Lowering blood glucose levels. What is my activity plan? Your health care provider or certified diabetes educator can help you make a plan for the type and frequency of exercise (activity plan) that works for you. Make sure that you:  Do at least 150 minutes of moderate-intensity or vigorous-intensity exercise each week. This could be brisk walking, biking, or water aerobics. ? Do stretching and strength exercises, such as yoga or weightlifting, at least 2 times a week. ? Spread out your activity over at least 3 days of the week.  Get some form of physical activity every day. ? Do not go more than 2 days in a row without some kind of physical activity. ? Avoid being inactive for more than 30 minutes at a time. Take frequent breaks to walk or stretch.  Choose a type of exercise or activity that you enjoy, and set realistic goals.  Start slowly, and gradually increase the intensity of your exercise over time. What do I need to know about managing my  diabetes?   Check your blood glucose before and after exercising. ? If your blood glucose is 240 mg/dL (13.3 mmol/L) or higher before you exercise, check your urine for ketones. If you have ketones in your urine, do not exercise until your blood glucose returns to normal. ? If your blood glucose is 100 mg/dL (5.6 mmol/L) or lower, eat a snack containing 15-20 grams of carbohydrate. Check your blood glucose 15 minutes after the snack to make sure that your level is above 100 mg/dL (5.6 mmol/L) before you start your exercise.  Know the symptoms of low blood glucose (hypoglycemia) and how to treat it. Your risk for hypoglycemia increases during and after exercise. Common symptoms of hypoglycemia can include: ? Hunger. ? Anxiety. ? Sweating and feeling clammy. ? Confusion. ? Dizziness or feeling light-headed. ? Increased heart rate or palpitations. ? Blurry vision. ? Tingling or numbness around the mouth, lips, or tongue. ? Tremors or shakes. ? Irritability.  Keep a rapid-acting carbohydrate snack available before, during, and after exercise to help prevent or treat hypoglycemia.  Avoid injecting insulin into areas of the body that are going to be exercised. For example, avoid injecting insulin into: ? The arms, when playing tennis. ? The legs, when jogging.  Keep records of your exercise habits. Doing this can help you and your health care provider adjust your diabetes management plan as needed. Write down: ? Food that you eat before and after you exercise. ? Blood glucose levels before and after you exercise. ? The type and amount of exercise you have done. ? When your insulin is expected to peak, if you use   insulin. Avoid exercising at times when your insulin is peaking.  When you start a new exercise or activity, work with your health care provider to make sure the activity is safe for you, and to adjust your insulin, medicines, or food intake as needed.  Drink plenty of water while  you exercise to prevent dehydration or heat stroke. Drink enough fluid to keep your urine clear or pale yellow. Summary  Exercising regularly is important for your overall health, especially when you have diabetes (diabetes mellitus).  Exercising has many health benefits, such as increasing muscle strength and bone density and reducing body fat and stress.  Your health care provider or certified diabetes educator can help you make a plan for the type and frequency of exercise (activity plan) that works for you.  When you start a new exercise or activity, work with your health care provider to make sure the activity is safe for you, and to adjust your insulin, medicines, or food intake as needed. This information is not intended to replace advice given to you by your health care provider. Make sure you discuss any questions you have with your health care provider. Document Released: 01/18/2004 Document Revised: 05/08/2017 Document Reviewed: 04/08/2016 Elsevier Interactive Patient Education  2019 Elsevier Inc.  

## 2019-02-12 ENCOUNTER — Ambulatory Visit: Payer: Self-pay | Admitting: *Deleted

## 2019-02-12 ENCOUNTER — Other Ambulatory Visit: Payer: Self-pay | Admitting: Psychiatry

## 2019-02-12 ENCOUNTER — Other Ambulatory Visit: Payer: Self-pay | Admitting: Physician Assistant

## 2019-02-12 DIAGNOSIS — I10 Essential (primary) hypertension: Secondary | ICD-10-CM

## 2019-02-12 NOTE — Chronic Care Management (AMB) (Signed)
  Chronic Care Management   Outreach Note  02/12/2019 Name: Melinda [name] MRN: [id number] DOB: Feb 10, 1971  Referred by: Trinna Post, PA-C Reason for referral : Chronic Care Management (Initial Outreach)   An unsuccessful telephone outreach was attempted today. The patient was referred to the case management team by for assistance with diabetes management.   Follow Up Plan: The CM team will reach out to the patient again over the next 7 days.    [facility] Care Management [phone]

## 2019-02-15 ENCOUNTER — Other Ambulatory Visit: Payer: Self-pay

## 2019-02-15 DIAGNOSIS — E119 Type 2 diabetes mellitus without complications: Secondary | ICD-10-CM

## 2019-02-15 DIAGNOSIS — I10 Essential (primary) hypertension: Secondary | ICD-10-CM

## 2019-02-15 NOTE — Telephone Encounter (Signed)
Patient is requesting a refill. She states that [facility] gave her a different fax number to send it to [phone].

## 2019-02-16 MED ORDER — LISINOPRIL 10 MG PO TABS: 10.0000 mg | ORAL_TABLET | Freq: Every day | ORAL | 0 refills | Status: DC

## 2019-02-16 MED ORDER — METFORMIN HCL ER 500 MG PO TB24: ORAL_TABLET | ORAL | 0 refills | Status: DC

## 2019-02-18 ENCOUNTER — Ambulatory Visit: Payer: Self-pay

## 2019-02-18 DIAGNOSIS — I10 Essential (primary) hypertension: Secondary | ICD-10-CM

## 2019-02-18 DIAGNOSIS — E119 Type 2 diabetes mellitus without complications: Secondary | ICD-10-CM

## 2019-02-18 NOTE — Chronic Care Management (AMB) (Signed)
  Chronic Care Management   Outreach Note  02/12/2019 Name: Melinda [name]       MRN: [id number]       DOB: 21-Oct-1971  Referred by: Trinna Post, PA-C Reason for referral : Chronic Care Management (Initial Outreach)   An unsuccessful telephone outreach #2 was attempted today for Chronic Case Management Services introduction. The patient was referred to the case management team by for assistance with diabetes management.   Follow Up Plan: The CM team will reach out to the patient again over the next 7 days.   Dennys Guin E. Rollene Rotunda, RN, BSN Nurse Care Coordinator [facility] Care Management [phone]

## 2019-02-19 ENCOUNTER — Other Ambulatory Visit: Payer: Self-pay | Admitting: Psychiatry

## 2019-02-22 ENCOUNTER — Telehealth: Payer: Self-pay

## 2019-02-22 NOTE — Telephone Encounter (Signed)
Patient notified to contact psychiatrist for medication refill.

## 2019-02-22 NOTE — Telephone Encounter (Signed)
Patient called and stated she would like a refill on her Ziprasidone 80 mg capsule, 90 day supply send to [facility]. Patient was advise that Dr. Weber Cooks is the one that filled the medication. [facility] fax # [phone]. Please advise.

## 2019-02-22 NOTE — Telephone Encounter (Signed)
Yes she gets this from psychiatry.

## 2019-02-26 ENCOUNTER — Other Ambulatory Visit: Payer: Self-pay

## 2019-02-26 ENCOUNTER — Other Ambulatory Visit: Payer: Self-pay | Admitting: Psychiatry

## 2019-02-26 ENCOUNTER — Encounter: Payer: Self-pay | Admitting: Anesthesiology

## 2019-02-26 ENCOUNTER — Encounter
Admission: RE | Admit: 2019-02-26 | Discharge: 2019-02-26 | Disposition: A | Discharge status: Home / Self Care | Payer: Medicare PPO | Source: Ambulatory Visit | Attending: Psychiatry | Admitting: Psychiatry

## 2019-02-26 DIAGNOSIS — G4700 Insomnia, unspecified: Secondary | ICD-10-CM | POA: Diagnosis not present

## 2019-02-26 DIAGNOSIS — F332 Major depressive disorder, recurrent severe without psychotic features: Secondary | ICD-10-CM | POA: Diagnosis not present

## 2019-02-26 DIAGNOSIS — E1142 Type 2 diabetes mellitus with diabetic polyneuropathy: Secondary | ICD-10-CM | POA: Diagnosis not present

## 2019-02-26 DIAGNOSIS — I10 Essential (primary) hypertension: Secondary | ICD-10-CM | POA: Insufficient documentation

## 2019-02-26 DIAGNOSIS — F329 Major depressive disorder, single episode, unspecified: Secondary | ICD-10-CM | POA: Diagnosis not present

## 2019-02-26 DIAGNOSIS — R454 Irritability and anger: Secondary | ICD-10-CM | POA: Diagnosis not present

## 2019-02-26 DIAGNOSIS — E7800 Pure hypercholesterolemia, unspecified: Secondary | ICD-10-CM | POA: Diagnosis not present

## 2019-02-26 DIAGNOSIS — E1140 Type 2 diabetes mellitus with diabetic neuropathy, unspecified: Secondary | ICD-10-CM | POA: Diagnosis not present

## 2019-02-26 DIAGNOSIS — F419 Anxiety disorder, unspecified: Secondary | ICD-10-CM | POA: Diagnosis not present

## 2019-02-26 DIAGNOSIS — G4730 Sleep apnea, unspecified: Secondary | ICD-10-CM | POA: Insufficient documentation

## 2019-02-26 DIAGNOSIS — K219 Gastro-esophageal reflux disease without esophagitis: Secondary | ICD-10-CM | POA: Diagnosis not present

## 2019-02-26 LAB — GLUCOSE, CAPILLARY: Glucose-Capillary: 106 mg/dL — ABNORMAL HIGH (ref 70–99)

## 2019-02-26 MED ORDER — KETOROLAC TROMETHAMINE 30 MG/ML IJ SOLN
INTRAMUSCULAR | Status: AC
  Filled 2019-02-26: qty 1

## 2019-02-26 MED ORDER — FENTANYL CITRATE (PF) 100 MCG/2ML IJ SOLN: 25.0000 ug | INTRAMUSCULAR | Status: DC | PRN

## 2019-02-26 MED ORDER — ONDANSETRON HCL 4 MG/2ML IJ SOLN: 4.0000 mg | Freq: Once | INTRAMUSCULAR | Status: DC | PRN

## 2019-02-26 MED ORDER — GLYCOPYRROLATE 0.2 MG/ML IJ SOLN
INTRAMUSCULAR | Status: AC
  Filled 2019-02-26: qty 2

## 2019-02-26 MED ORDER — SUCCINYLCHOLINE CHLORIDE 20 MG/ML IJ SOLN
INTRAMUSCULAR | Status: DC | PRN
  Administered 2019-02-26: 100 mg via INTRAVENOUS

## 2019-02-26 MED ORDER — ESMOLOL HCL 100 MG/10ML IV SOLN
INTRAVENOUS | Status: DC | PRN
  Administered 2019-02-26: 20 mg via INTRAVENOUS

## 2019-02-26 MED ORDER — LABETALOL HCL 5 MG/ML IV SOLN
INTRAVENOUS | Status: DC | PRN
  Administered 2019-02-26: 30 mg via INTRAVENOUS

## 2019-02-26 MED ORDER — SODIUM CHLORIDE 0.9 % IV SOLN
500.0000 mL | Freq: Once | INTRAVENOUS | Status: AC
  Administered 2019-02-26: 500 mL via INTRAVENOUS

## 2019-02-26 MED ORDER — SODIUM CHLORIDE 0.9 % IV SOLN
INTRAVENOUS | Status: DC | PRN
  Administered 2019-02-26: 11:00:00 via INTRAVENOUS

## 2019-02-26 MED ORDER — METHOHEXITAL SODIUM 100 MG/10ML IV SOSY
PREFILLED_SYRINGE | INTRAVENOUS | Status: DC | PRN
  Administered 2019-02-26: 70 mg via INTRAVENOUS

## 2019-02-26 MED ORDER — KETOROLAC TROMETHAMINE 30 MG/ML IJ SOLN
30.0000 mg | Freq: Once | INTRAMUSCULAR | Status: AC
  Administered 2019-02-26: 30 mg via INTRAVENOUS

## 2019-02-26 MED ORDER — GLYCOPYRROLATE 0.2 MG/ML IJ SOLN
0.1000 mg | Freq: Once | INTRAMUSCULAR | Status: AC
  Administered 2019-02-26: 0.1 mg via INTRAVENOUS

## 2019-02-26 NOTE — Anesthesia Preprocedure Evaluation (Signed)
Anesthesia Evaluation  Patient identified by MRN, date of birth, ID band Patient awake    Reviewed: Allergy & Precautions, H&P , NPO status , Patient's Chart, lab work & pertinent test results, reviewed documented beta blocker date and time   Airway Mallampati: II   Neck ROM: full    Dental  (+) Poor Dentition   Pulmonary neg pulmonary ROS, sleep apnea ,    Pulmonary exam normal        Cardiovascular Exercise Tolerance: Poor hypertension, On Medications negative cardio ROS Normal cardiovascular exam Rhythm:regular Rate:Normal     Neuro/Psych PSYCHIATRIC DISORDERS Depression Bipolar Disorder  Neuromuscular disease negative neurological ROS  negative psych ROS   GI/Hepatic negative GI ROS, Neg liver ROS, GERD  ,  Endo/Other  negative endocrine ROSdiabetes  Renal/GU negative Renal ROS  negative genitourinary   Musculoskeletal   Abdominal   Peds  Hematology negative hematology ROS (+)   Anesthesia Other Findings Past Medical History: No date: Depression No date: Diabetes mellitus without complication ([place]) 1/73/56: Diabetic peripheral neuropathy ([place]) 03/07/15: Diabetic peripheral neuropathy ([place]) 03/07/15: Diabetic peripheral neuropathy ([facility]) No date: GERD (gastroesophageal reflux disease) 03/07/15: Hypercholesterolemia No date: Hypertension 03/07/15: Obesity 03/07/15: Personality disorder ([place]) 03/07/15: Sinus tachycardia     Comment:  history of 03/07/15: Suicidal thoughts Past Surgical History: 12/01/2018: COLONOSCOPY WITH PROPOFOL; N/A     Comment:  Procedure: COLONOSCOPY WITH PROPOFOL;  Surgeon: Jonathon Bellows, MD;  Location: [facility] ENDOSCOPY;  Service:               Gastroenterology;  Laterality: N/A; 03/07/15: electroconvulsion therapy   Reproductive/Obstetrics negative OB ROS                             Anesthesia Physical Anesthesia Plan  ASA: III  Anesthesia Plan:  General   Post-op Pain Management:    Induction:   PONV Risk Score and Plan:   Airway Management Planned:   Additional Equipment:   Intra-op Plan:   Post-operative Plan:   Informed Consent: I have reviewed the patients History and Physical, chart, labs and discussed the procedure including the risks, benefits and alternatives for the proposed anesthesia with the patient or authorized representative who has indicated his/her understanding and acceptance.     Dental Advisory Given  Plan Discussed with: CRNA  Anesthesia Plan Comments:         Anesthesia Quick Evaluation

## 2019-02-26 NOTE — H&P (Signed)
Melinda [name] is an 48 y.o. female.   Chief Complaint: Patient with no specific new complaints.  Chronic depression.  Chronic anxiety. HPI: History of recurrent severe depression  Past Medical History:  Diagnosis Date  . Depression   . Diabetes mellitus without complication ([place])   . Diabetic peripheral neuropathy ([place]) 03/07/15  . Diabetic peripheral neuropathy ([place]) 03/07/15  . Diabetic peripheral neuropathy ([place]) 03/07/15  . GERD (gastroesophageal reflux disease)   . Hypercholesterolemia 03/07/15  . Hypertension   . Obesity 03/07/15  . Personality disorder ([place]) 03/07/15  . Sinus tachycardia 03/07/15   history of  . Suicidal thoughts 03/07/15    Past Surgical History:  Procedure Laterality Date  . COLONOSCOPY WITH PROPOFOL N/A 12/01/2018   Procedure: COLONOSCOPY WITH PROPOFOL;  Surgeon: Jonathon Bellows, MD;  Location: [facility] END[facility];  Service: Gastroenterology;  Laterality: N/A;  . electroconvulsion therapy  03/07/15    Family History  Problem Relation Age of Onset  . Hypertension Father   . Diabetes Mother    Social History:  reports that she has never smoked. She has never used smokeless tobacco. She reports that she does not drink alcohol or use drugs.  Allergies:  Allergies  Allergen Reactions  . Prednisone     Increases blood sugar    (Not in a hospital admission)   Results for orders placed or performed during the hospital encounter of 02/26/19 (from the past 48 hour(s))  Glucose, capillary     Status: Abnormal   Collection Time: 02/26/19  8:29 AM  Result Value Ref Range   Glucose-Capillary 106 (H) 70 - 99 mg/dL   No results found.  Review of Systems  Constitutional: Negative.   HENT: Negative.   Eyes: Negative.   Respiratory: Negative.   Cardiovascular: Negative.   Gastrointestinal: Negative.   Musculoskeletal: Negative.   Skin: Negative.   Neurological: Negative.   Psychiatric/Behavioral: Positive for depression. Negative for hallucinations, memory loss,  substance abuse and suicidal ideas. The patient is nervous/anxious and has insomnia.     Blood pressure 139/79, pulse 95, temperature 98.1 F (36.7 C), temperature source Oral, resp. rate 16, SpO2 98 %. Physical Exam  Nursing note and vitals reviewed. Constitutional: She appears well-developed and well-nourished.  HENT:  Head: Normocephalic and atraumatic.  Eyes: Pupils are equal, round, and reactive to light. Conjunctivae are normal.  Neck: Normal range of motion.  Cardiovascular: Regular rhythm and normal heart sounds.  Respiratory: Effort normal.  GI: Soft.  Musculoskeletal: Normal range of motion.  Neurological: She is alert.  Skin: Skin is warm and dry.  Psychiatric: Judgment normal. Her affect is blunt. Her speech is delayed. She is slowed. Cognition and memory are normal. She exhibits a depressed mood. She expresses no suicidal ideation.     Assessment/Plan A little more irritable than usual today but not suicidal or psychotic.  Plan will be for ECT and to continue the 2-week maintenance course  [name], MD 02/26/2019, 10:50 AM

## 2019-02-26 NOTE — Procedures (Signed)
ECT SERVICES Physician's Interval Evaluation & Treatment Note  Patient Identification: CHASLYN EISEN MRN:  [id number] Date of Evaluation:  02/26/2019 TX #: [id number]  MADRS:   MMSE:   P.E. Findings:  No change physical exam  Psychiatric Interval Note:  Feeling more depressed and anxious.  Upset about the whole virus situation.  Subjective:  Patient is a 48 y.o. female seen for evaluation for Electroconvulsive Therapy. Depressed and anxious no suicidal ideation however she has been picking at herself more  Treatment Summary:   []   Right Unilateral             [x]  Bilateral   % Energy : 1.0 ms, 35%   Impedance: 1150 ohms  Seizure Energy Index: 10,098 V squared  Postictal Suppression Index: 81%  Seizure Concordance Index: 95%  Medications  Pre Shock: Robinul 0.4 mg Toradol 30 mg Brevital 70 mg succinylcholine 100 mg labetalol 20 mg esmolol 20 mg  Post Shock:    Seizure Duration: 42 seconds EMG 90 seconds EEG   Comments: Follow-up the usual 2 weeks  Lungs:  [x]   Clear to auscultation               []  Other:   Heart:    [x]   Regular rhythm             []  irregular rhythm    [x]   Previous H&P reviewed, patient examined and there are NO CHANGES                 []   Previous H&P reviewed, patient examined and there are changes noted.   [name], MD 4/17/202010:53 AM

## 2019-02-26 NOTE — Discharge Instructions (Signed)
1)  The drugs that you have been given will stay in your system until tomorrow so for the       next 24 hours you should not:  A. Drive an automobile  B. Make any legal decisions  C. Drink any alcoholic beverages  2)  You may resume your regular meals upon return home.  3)  A responsible adult must take you home.  Someone should stay with you for a few          hours, then be available by phone for the remainder of the treatment day.  4)  You May experience any of the following symptoms:  Headache, Nausea and a dry mouth (due to the medications you were given),  temporary memory loss and some confusion, or sore muscles (a warm bath  should help this).  If you you experience any of these symptoms let us know on                your return visit.  5)  Report any of the following: any acute discomfort, severe headache, or temperature        greater than 100.5 F.   Also report any unusual redness, swelling, drainage, or pain         at your IV site.    You may report Symptoms to:  [place] [facility]          Phone: [phone], ECT Department           or Dr. Prescott Gum office [phone]  6)  Your next ECT Treatment is Friday May 1   We will call 2 days prior to your scheduled appointment for arrival times.  7)  Nothing to eat or drink after midnight the night before your procedure.  8)  Take    With a sip of water the morning of your procedure.  9)  Other Instructions: Call [phone] to cancel the morning of your procedure due         to illness or emergency.  10) We will call within 72 hours to assess how you are feeling.

## 2019-02-26 NOTE — Transfer of Care (Signed)
Immediate Anesthesia Transfer of Care Note  Patient: Melinda [name]  Procedure(s) Performed: ECT TX  Patient Location: PACU  Anesthesia Type:General  Level of Consciousness: awake  Airway & Oxygen Therapy: Patient Spontanous Breathing and Patient connected to face mask oxygen  Post-op Assessment: Report given to RN and Post -op Vital signs reviewed and stable  Post vital signs: Reviewed  Last Vitals:  Vitals Value Taken Time  BP 157/86 02/26/2019 11:11 AM  Temp    Pulse 102 02/26/2019 11:11 AM  Resp 16 02/26/2019 11:11 AM  SpO2 95 % 02/26/2019 11:11 AM  Vitals shown include unvalidated device data.  Last Pain:  Vitals:   02/26/19 0858  TempSrc: Oral  PainSc: 0-No pain         Complications: No apparent anesthesia complications

## 2019-02-26 NOTE — Anesthesia Post-op Follow-up Note (Signed)
Anesthesia QCDR form completed.        

## 2019-02-27 ENCOUNTER — Other Ambulatory Visit: Payer: Self-pay | Admitting: Psychiatry

## 2019-03-01 ENCOUNTER — Ambulatory Visit: Payer: Self-pay

## 2019-03-01 DIAGNOSIS — F332 Major depressive disorder, recurrent severe without psychotic features: Secondary | ICD-10-CM

## 2019-03-01 DIAGNOSIS — E119 Type 2 diabetes mellitus without complications: Secondary | ICD-10-CM

## 2019-03-01 DIAGNOSIS — E7800 Pure hypercholesterolemia, unspecified: Secondary | ICD-10-CM

## 2019-03-01 DIAGNOSIS — I10 Essential (primary) hypertension: Secondary | ICD-10-CM

## 2019-03-01 NOTE — Chronic Care Management (AMB) (Signed)
  Chronic Care Management   Outreach Note  02/12/2019 Name:Melinda M PaylorMRN: [id number] DOB: 09-29-1971  Referred BO:[id number], Wendee Beavers, PA-C Reason for referral :Chronic Care Management (Introduction of CCM Services)   An unsuccessful telephone outreach #3 was attempted today for Chronic Case Management Services introduction. The patient was referred to the case management team by for assistance withdiabetes management.  Follow Up Plan:CCM Team will suspend outreach attempts as patient has not responded to left messages x3. Referral will be closed if patient does not respond to this call within 7 days. CCM Team will be happy to reopen referral once closed if patient wishes to engage with CCM Team.  Truddie Crumble E. Rollene Rotunda, RN, BSN Nurse Care Coordinator [facility] Care Management [phone]

## 2019-03-01 NOTE — Chronic Care Management (AMB) (Signed)
  Chronic Care Management   Note  03/01/2019 Name: Melinda [name] MRN: [id number] DOB: [birth year]  Melinda [name] is a 48 year old female who sees Carles Collet, [place] for primary care. Ms. Melinda [name] asked the CCM team to consult the patient for chronic case management secondary to DM education. Patient has a history of but not limited to Severe Depression, Hypercholesteremia, DM, cardiomyopathy, and sleep Apnea. Telephone outreach to patient to introduce CCM services x 3. Patient returned CCM RN CM call now and agreed to [facility] services. Ms. Melinda [name] wishes to meet with CCM RN CM face to face once Covid-19 restrictions are lifted.  SDOH (Social Determinants of Health) screening performed today. See Care Plan Entry related to challenges with: Depression   Social Connections  Ms. Melinda [name] was given information about Chronic Care Management services today including:  1. CCM service includes personalized support from designated clinical staff supervised by her physician, including individualized plan of care and coordination with other care providers 2. 24/7 contact phone numbers for assistance for urgent and routine care needs. 3. Service will only be billed when office clinical staff spend 20 minutes or more in a month to coordinate care. 4. Only one practitioner may furnish and bill the service in a calendar month. 5. The patient may stop CCM services at any time (effective at the end of the month) by phone call to the office staff. 6. The patient will be responsible for cost sharing (co-pay) of up to 20% of the service fee (after annual deductible is met).  Patient agreed to services and verbal consent obtained.      Plan: Will schedule initial face to face with patient once Covid-19 restrictions are lifted per patient request. Patient was offered telephonic assessment and goal setting but declined.  Judia Arnott E. Rollene Rotunda, RN, BSN Nurse Care Coordinator [facility]  [phone]

## 2019-03-02 ENCOUNTER — Other Ambulatory Visit: Payer: Self-pay

## 2019-03-02 ENCOUNTER — Encounter: Payer: Self-pay | Admitting: Physician Assistant

## 2019-03-02 ENCOUNTER — Ambulatory Visit (INDEPENDENT_AMBULATORY_CARE_PROVIDER_SITE_OTHER): Payer: Medicare PPO | Admitting: Physician Assistant

## 2019-03-02 VITALS — BP 126/72 | HR 83 | Temp 97.9°F | Wt 263.8 lb

## 2019-03-02 DIAGNOSIS — M79601 Pain in right arm: Secondary | ICD-10-CM | POA: Diagnosis not present

## 2019-03-02 MED ORDER — MELOXICAM 7.5 MG PO TABS: 7.5000 mg | ORAL_TABLET | Freq: Every day | ORAL | 0 refills | Status: DC

## 2019-03-02 NOTE — Patient Instructions (Signed)
Biceps Tendon Disruption (Proximal) Rehab Ask your health care provider which exercises are safe for you. Do exercises exactly as told by your health care provider and adjust them as directed. It is normal to feel mild stretching, pulling, tightness, or discomfort as you do these exercises, but you should stop right away if you feel sudden pain or your pain gets worse.Do not begin these exercises until told by your health care provider. Stretching and range of motion exercises These exercises warm up your muscles and joints and improve the movement and flexibility of your arm and shoulder. These exercises also help to relieve pain and stiffness. Exercise A: Shoulder flexion, standing  1. Stand facing a wall. Put your left / right hand on the wall. 2. Slide your left / right hand up the wall. Stop when you feel a stretch in your shoulder, or when you reach the angle recommended by your health care provider. ? Use your other hand to help raise your arm, if needed. ? As your hand gets higher, you may need to step closer to the wall. ? Avoid shrugging your shoulder while you raise your arm. To do this, keep your shoulder blade tucked down toward your spine. 3. Hold for __________ seconds. 4. Slowly return to the starting position. Use your other arm to help, if needed. Repeat __________ times. Complete this exercise __________ times a day. Exercise B: Pendulum  1. Stand near a wall or a surface that you can hold onto for balance. 2. Bend at the waist and let your left / right arm hang straight down. Use your other arm to support you. 3. Relax your arm and shoulder muscles, and move your hips and your trunk so your left / right arm swings freely. Your arm should swing because of the motion of your body, not because you are using your arm or shoulder muscles. 4. Keep moving so your arm swings in the following directions, as told by your health care provider: ? Side to side. ? Forward and  backward. ? In clockwise and counterclockwise circles. 5. Slowly return to the starting position. Repeat __________ times. Complete this exercise __________ times a day. Strengthening exercises These exercises build strength and endurance in your arm and shoulder. Endurance is the ability to use your muscles for a long time, even after your muscles get tired. Exercise C: Elbow flexion, neutral 1. Sit on a stable chair without armrests, or stand. 2. Hold a __________ weight in your left / right hand, or hold an exercise band with both hands. Your palms should face each other at the starting position. 3. Bend your left / right elbow and move your hand up toward your shoulder. ? Lead with your thumb, and keep your palm facing the same direction. ? Keep your other arm straight down, in the starting position. 4. Slowly return to the starting position. Repeat __________ times. Complete this exercise __________ times a day. Exercise D: Forearm supination  1. Sit with your left / right forearm on a table. Your elbow should be below shoulder height. Rest your hand over the edge of the table so your palm faces down. 2. If directed, hold a hammer with your left / right hand. 3. Without moving your elbow, slowly rotate your hand so your palm faces up toward the ceiling. ? If you are holding a hammer, begin by holding the hammer near the head. When this exercise gets easier for you, hold the hammer farther down the handle. 4. Hold  for __________ seconds. 5. Slowly return to the starting position. Repeat __________ times. Complete this exercise __________ times a day. Exercise E: Scapular retraction  1. Sit in a stable chair without armrests, or stand. 2. Secure an exercise band to a stable object in front of you so the band is at shoulder height. 3. Hold one end of the exercise band in each hand. 4. Squeeze your shoulder blades together and move your elbows slightly behind you. Do not shrug your  shoulders. 5. Hold for __________ seconds. 6. Slowly return to the starting position. Repeat __________ times. Complete this exercise __________ times a day. Exercise F: Scapular protraction, supine  1. Lie on your back on a firm surface. Hold a __________ weight in your left / right hand. 2. Raise your left / right arm straight into the air so your hand is directly above your shoulder joint. 3. Push the weight into the air so your shoulder lifts off of the surface that you are lying on. Do not move your head, neck, or back. 4. Hold for __________ seconds. 5. Slowly return to the starting position. Let your muscles relax completely before you repeat this exercise. Repeat __________ times. Complete this exercise __________ times a day. This information is not intended to replace advice given to you by your health care provider. Make sure you discuss any questions you have with your health care provider. Document Released: 10/28/2005 Document Revised: 07/04/2016 Document Reviewed: 10/06/2015 Elsevier Interactive Patient Education  2019 [employer].

## 2019-03-02 NOTE — Progress Notes (Signed)
Patient: Melinda [name] Female    DOB: Jan 26, 1971   48 y.o.   MRN: [id number] Visit Date: 03/02/2019  Today's Provider: Trinna Post, PA-C   Chief Complaint  Patient presents with  . Arm Pain   Subjective:   THIS VISIT WAS CONDUCTED IN PERSON.   Arm Pain   The incident occurred 3 to 5 days ago. There was no injury mechanism. The pain is present in the upper left arm, upper right arm, right forearm and left forearm. The quality of the pain is described as aching. The pain is at a severity of 8/10. The pain is moderate. The pain has been fluctuating since the incident. The symptoms are aggravated by movement. She has tried NSAIDs for the symptoms. The treatment provided no relief.   Patient reports a few days ago she was in bed and heard a "pop" come from her right arm. She reports she was trying to get up and may have been leaning on her arm to do so. Reports difficulty bathing after doing this.   Allergies  Allergen Reactions  . Prednisone     Increases blood sugar     Current Outpatient Medications:  .  amoxicillin (AMOXIL) 500 MG capsule, Take 500 mg by mouth 3 (three) times daily. Take 1 capsule by mouth three times daily for 10 days., Disp: , Rfl:  .  atorvastatin (LIPITOR) 10 MG tablet, Take 1 tablet (10 mg total) by mouth at bedtime., Disp: 90 tablet, Rfl: 1 .  buPROPion (WELLBUTRIN XL) 300 MG 24 hr tablet, TAKE 1 TABLET EVERY DAY, Disp: 90 tablet, Rfl: 0 .  escitalopram (LEXAPRO) 20 MG tablet, TAKE 1 TABLET (20 MG TOTAL) BY MOUTH DAILY., Disp: 90 tablet, Rfl: 0 .  Lancets Misc. (ACCU-CHEK FASTCLIX LANCET) KIT, , Disp: , Rfl:  .  lisinopril (PRINIVIL,ZESTRIL) 10 MG tablet, Take 1 tablet (10 mg total) by mouth daily., Disp: 90 tablet, Rfl: 0 .  metFORMIN (GLUCOPHAGE XR) 500 MG 24 hr tablet, Take 1 tablet (500 mg total) by mouth daily with breakfast for 14 days, THEN 1 tablet (500 mg total) 2 (two) times daily., Disp: 194 tablet, Rfl: 0 .  ziprasidone (GEODON) 80 MG  capsule, TAKE 1 CAPSULE TWO TIMES DAILY WITH A MEAL., Disp: 180 capsule, Rfl: 0  Review of Systems  Social History   Tobacco Use  . Smoking status: Never Smoker  . Smokeless tobacco: Never Used  Substance Use Topics  . Alcohol use: No      Objective:   BP 126/72 (BP Location: Left Wrist, Patient Position: Sitting, Cuff Size: Normal)   Pulse 83   Temp 97.9 F (36.6 C) (Oral)   Wt 263 lb 12.8 oz (119.7 kg)   LMP  (LMP Unknown) Comment: pt states she has not had period in the past year  SpO2 97%   BMI 45.28 kg/m  Vitals:   03/02/19 1009  BP: 126/72  Pulse: 83  Temp: 97.9 F (36.6 C)  TempSrc: Oral  SpO2: 97%  Weight: 263 lb 12.8 oz (119.7 kg)     Physical Exam Constitutional:      Appearance: Normal appearance. She is obese.  Musculoskeletal:     Comments: 5/5 strength bilateral shoulders. 5/5 strength left elbow flexion, 4/5 strength right elbow flexion that increases with encouragement. Pain with flexion of right elbow. No pain with supination or pronation bilaterally. Right upper arm is perhaps slightly swollen though this is difficulty to gauge due to  significant adiposity. There is a soft, discrete feeling mass on the medial aspect of her right upper arm that is tender to palpation. No visible bruising.   Skin:    General: Skin is warm and dry.     Findings: No erythema.  Neurological:     Mental Status: She is alert.  Psychiatric:        Mood and Affect: Mood normal.        Behavior: Behavior normal.         Assessment & Plan    1. Right arm pain  Possible proximal biceps tendon tear or rupture. Will treat conservatively for now with mobic as below, ice, and rest. May need to refer to specialist if not improving in 2-4 weeks.   - meloxicam (MOBIC) 7.5 MG tablet; Take 1 tablet (7.5 mg total) by mouth daily.  Dispense: 30 tablet; Refill: 0  The entirety of the information documented in the History of Present Illness, Review of Systems and Physical Exam  were personally obtained by me. Portions of this information were initially documented by [facility], CMA and reviewed by me for thoroughness and accuracy.   F/u PRN.       Trinna Post, PA-C  [facility]

## 2019-03-03 NOTE — Anesthesia Postprocedure Evaluation (Signed)
Anesthesia Post Note  Patient: Melinda [name]  Procedure(s) Performed: ECT TX  Patient location during evaluation: PACU Anesthesia Type: General Level of consciousness: awake and alert Pain management: pain level controlled Vital Signs Assessment: post-procedure vital signs reviewed and stable Respiratory status: spontaneous breathing, nonlabored ventilation, respiratory function stable and patient connected to nasal cannula oxygen Cardiovascular status: blood pressure returned to baseline and stable Postop Assessment: no apparent nausea or vomiting Anesthetic complications: no     Last Vitals:  Vitals:   02/26/19 1140 02/26/19 1150  BP: 135/86 123/90  Pulse: 82 79  Resp: (!) 29 16  Temp:    SpO2: 95%     Last Pain:  Vitals:   02/26/19 1150  TempSrc:   PainSc: 0-No pain                 Molli Barrows

## 2019-03-03 NOTE — Anesthesia Postprocedure Evaluation (Signed)
Anesthesia Post Note  Patient: Melinda [name]  Procedure(s) Performed: ECT TX  Anesthesia Type: General     Last Vitals:  Vitals:   02/26/19 1140 02/26/19 1150  BP: 135/86 123/90  Pulse: 82 79  Resp: (!) 29 16  Temp:    SpO2: 95%     Last Pain:  Vitals:   02/26/19 1150  TempSrc:   PainSc: 0-No pain                 Molli Barrows

## 2019-03-10 ENCOUNTER — Telehealth: Payer: Self-pay

## 2019-03-11 ENCOUNTER — Other Ambulatory Visit: Payer: Self-pay | Admitting: Psychiatry

## 2019-03-12 ENCOUNTER — Encounter: Payer: Self-pay | Admitting: Certified Registered Nurse Anesthetist

## 2019-03-12 ENCOUNTER — Encounter
Admission: RE | Admit: 2019-03-12 | Discharge: 2019-03-12 | Disposition: A | Discharge status: Home / Self Care | Payer: Medicare PPO | Source: Ambulatory Visit | Attending: Psychiatry | Admitting: Psychiatry

## 2019-03-12 ENCOUNTER — Other Ambulatory Visit: Payer: Self-pay

## 2019-03-12 DIAGNOSIS — F329 Major depressive disorder, single episode, unspecified: Secondary | ICD-10-CM | POA: Diagnosis not present

## 2019-03-12 DIAGNOSIS — F419 Anxiety disorder, unspecified: Secondary | ICD-10-CM | POA: Insufficient documentation

## 2019-03-12 DIAGNOSIS — Z888 Allergy status to other drugs, medicaments and biological substances status: Secondary | ICD-10-CM | POA: Diagnosis not present

## 2019-03-12 DIAGNOSIS — I10 Essential (primary) hypertension: Secondary | ICD-10-CM | POA: Insufficient documentation

## 2019-03-12 DIAGNOSIS — Z6841 Body Mass Index (BMI) 40.0 and over, adult: Secondary | ICD-10-CM | POA: Insufficient documentation

## 2019-03-12 DIAGNOSIS — F332 Major depressive disorder, recurrent severe without psychotic features: Secondary | ICD-10-CM | POA: Diagnosis not present

## 2019-03-12 DIAGNOSIS — Z8249 Family history of ischemic heart disease and other diseases of the circulatory system: Secondary | ICD-10-CM | POA: Diagnosis not present

## 2019-03-12 DIAGNOSIS — E669 Obesity, unspecified: Secondary | ICD-10-CM | POA: Diagnosis not present

## 2019-03-12 DIAGNOSIS — E7800 Pure hypercholesterolemia, unspecified: Secondary | ICD-10-CM | POA: Diagnosis not present

## 2019-03-12 DIAGNOSIS — Z833 Family history of diabetes mellitus: Secondary | ICD-10-CM | POA: Diagnosis not present

## 2019-03-12 DIAGNOSIS — E119 Type 2 diabetes mellitus without complications: Secondary | ICD-10-CM | POA: Diagnosis not present

## 2019-03-12 DIAGNOSIS — G4730 Sleep apnea, unspecified: Secondary | ICD-10-CM | POA: Diagnosis not present

## 2019-03-12 DIAGNOSIS — E1142 Type 2 diabetes mellitus with diabetic polyneuropathy: Secondary | ICD-10-CM | POA: Diagnosis not present

## 2019-03-12 LAB — GLUCOSE, CAPILLARY: Glucose-Capillary: 95 mg/dL (ref 70–99)

## 2019-03-12 MED ORDER — KETOROLAC TROMETHAMINE 30 MG/ML IJ SOLN
INTRAMUSCULAR | Status: AC
  Administered 2019-03-12: 30 mg via INTRAVENOUS
  Filled 2019-03-12: qty 1

## 2019-03-12 MED ORDER — KETOROLAC TROMETHAMINE 30 MG/ML IJ SOLN
30.0000 mg | Freq: Once | INTRAMUSCULAR | Status: AC
  Administered 2019-03-12: 30 mg via INTRAVENOUS

## 2019-03-12 MED ORDER — SODIUM CHLORIDE 0.9 % IV SOLN
500.0000 mL | Freq: Once | INTRAVENOUS | Status: AC
  Administered 2019-03-12: 500 mL via INTRAVENOUS

## 2019-03-12 MED ORDER — GLYCOPYRROLATE 0.2 MG/ML IJ SOLN
INTRAMUSCULAR | Status: AC
  Administered 2019-03-12: 0.4 mg via INTRAVENOUS
  Filled 2019-03-12: qty 2

## 2019-03-12 MED ORDER — SUCCINYLCHOLINE CHLORIDE 20 MG/ML IJ SOLN
INTRAMUSCULAR | Status: DC | PRN
  Administered 2019-03-12: 100 mg via INTRAVENOUS

## 2019-03-12 MED ORDER — ESMOLOL HCL 100 MG/10ML IV SOLN
INTRAVENOUS | Status: DC | PRN
  Administered 2019-03-12: 20 mg via INTRAVENOUS

## 2019-03-12 MED ORDER — METHOHEXITAL SODIUM 100 MG/10ML IV SOSY
PREFILLED_SYRINGE | INTRAVENOUS | Status: DC | PRN
  Administered 2019-03-12: 70 mg via INTRAVENOUS

## 2019-03-12 MED ORDER — LABETALOL HCL 5 MG/ML IV SOLN
INTRAVENOUS | Status: DC | PRN
  Administered 2019-03-12: 30 mg via INTRAVENOUS

## 2019-03-12 MED ORDER — GLYCOPYRROLATE 0.2 MG/ML IJ SOLN
0.4000 mg | Freq: Once | INTRAMUSCULAR | Status: AC
  Administered 2019-03-12: 0.4 mg via INTRAVENOUS

## 2019-03-12 NOTE — Anesthesia Preprocedure Evaluation (Addendum)
Anesthesia Evaluation  Patient identified by MRN, date of birth, ID band Patient awake    Reviewed: Allergy & Precautions, H&P , NPO status , Patient's Chart, lab work & pertinent test results, reviewed documented beta blocker date and time   Airway Mallampati: III  TM Distance: >3 FB     Dental  (+) Teeth Intact   Pulmonary sleep apnea ,           Cardiovascular Exercise Tolerance: Poor hypertension, On Medications      Neuro/Psych PSYCHIATRIC DISORDERS Depression Bipolar Disorder  Neuromuscular disease    GI/Hepatic Neg liver ROS, GERD  ,  Endo/Other  diabetes  Renal/GU negative Renal ROS  negative genitourinary   Musculoskeletal   Abdominal   Peds  Hematology negative hematology ROS (+)   Anesthesia Other Findings Past Medical History: No date: Depression No date: Diabetes mellitus without complication ([place]) 3/89/37: Diabetic peripheral neuropathy ([place]) 03/07/15: Diabetic peripheral neuropathy ([place]) 03/07/15: Diabetic peripheral neuropathy ([place]) No date: GERD (gastroesophageal reflux disease) 03/07/15: Hypercholesterolemia No date: Hypertension 03/07/15: Obesity 03/07/15: Personality disorder ([place]) 03/07/15: Sinus tachycardia     Comment:  history of 03/07/15: Suicidal thoughts Past Surgical History: 12/01/2018: COLONOSCOPY WITH PROPOFOL; N/A     Comment:  Procedure: COLONOSCOPY WITH PROPOFOL;  Surgeon: Jonathon Bellows, MD;  Location[facility] ENDOSCOPY;  Service:               Gastroenterology;  Laterality: N/A; 03/07/15: electroconvulsion therapy   Reproductive/Obstetrics negative OB ROS                           Anesthesia Physical  Anesthesia Plan  ASA: III  Anesthesia Plan: General   Post-op Pain Management:    Induction:   PONV Risk Score and Plan:   Airway Management Planned: Mask  Additional Equipment:   Intra-op Plan:   Post-operative Plan:   Informed  Consent: I have reviewed the patients History and Physical, chart, labs and discussed the procedure including the risks, benefits and alternatives for the proposed anesthesia with the patient or authorized representative who has indicated his/her understanding and acceptance.     Dental Advisory Given  Plan Discussed with: Anesthesiologist  Anesthesia Plan Comments:         Anesthesia Quick Evaluation

## 2019-03-12 NOTE — Anesthesia Procedure Notes (Signed)
Performed by: Tamy Accardo, CRNA Pre-anesthesia Checklist: Patient identified, Patient being monitored, Timeout performed, Emergency Drugs available and Suction available Patient Re-evaluated:Patient Re-evaluated prior to induction Oxygen Delivery Method: Circle system utilized Preoxygenation: Pre-oxygenation with 100% oxygen Ventilation: Mask ventilation without difficulty Airway Equipment and Method: Bite block Dental Injury: Teeth and Oropharynx as per pre-operative assessment        

## 2019-03-12 NOTE — Anesthesia Postprocedure Evaluation (Signed)
Anesthesia Post Note  Patient: Melinda [name]  Procedure(s) Performed: ECT TX  Patient location during evaluation: PACU Anesthesia Type: General Level of consciousness: awake and alert Pain management: pain level controlled Vital Signs Assessment: post-procedure vital signs reviewed and stable Respiratory status: spontaneous breathing, nonlabored ventilation and respiratory function stable Cardiovascular status: blood pressure returned to baseline and stable Postop Assessment: no apparent nausea or vomiting Anesthetic complications: no     Last Vitals:  Vitals:   03/12/19 1040 03/12/19 1050  BP: (!) 157/99 139/82  Pulse: (!) 106 97  Resp: 18 (!) 22  Temp: 36.9 C   SpO2: 96% 99%    Last Pain:  Vitals:   03/12/19 1100  TempSrc:   PainSc: 0-No pain                 Durenda Hurt

## 2019-03-12 NOTE — Discharge Instructions (Signed)
1)  The drugs that you have been given will stay in your system until tomorrow so for the       next 24 hours you should not:  A. Drive an automobile  B. Make any legal decisions  C. Drink any alcoholic beverages  2)  You may resume your regular meals upon return home.  3)  A responsible adult must take you home.  Someone should stay with you for a few          hours, then be available by phone for the remainder of the treatment day.  4)  You May experience any of the following symptoms:  Headache, Nausea and a dry mouth (due to the medications you were given),  temporary memory loss and some confusion, or sore muscles (a warm bath  should help this).  If you you experience any of these symptoms let us know on                your return visit.  5)  Report any of the following: any acute discomfort, severe headache, or temperature        greater than 100.5 F.   Also report any unusual redness, swelling, drainage, or pain         at your IV site.    You may report Symptoms to:  [facility] at [facility]          Phone: [phone], ECT Department           or Dr. Prescott Gum office [phone]  6)  Your next ECT Treatment is May 15  We will call 2 days prior to your scheduled appointment for arrival times.  7)  Nothing to eat or drink after midnight the night before your procedure.  8)  Take     With a sip of water the morning of your procedure.  9)  Other Instructions: Call [phone] to cancel the morning of your procedure due         to illness or emergency.  10) We will call within 72 hours to assess how you are feeling.

## 2019-03-12 NOTE — Transfer of Care (Signed)
Immediate Anesthesia Transfer of Care Note  Patient: Melinda [name]  Procedure(s) Performed: ECT TX  Patient Location: PACU  Anesthesia Type:General  Level of Consciousness: awake and alert   Airway & Oxygen Therapy: Patient Spontanous Breathing and Patient connected to face mask oxygen  Post-op Assessment: Report given to RN and Post -op Vital signs reviewed and stable  Post vital signs: Reviewed and stable  Last Vitals:  Vitals Value Taken Time  BP    Temp    Pulse 106 03/12/2019 10:40 AM  Resp 18 03/12/2019 10:40 AM  SpO2 96 % 03/12/2019 10:40 AM  Vitals shown include unvalidated device data.  Last Pain:  Vitals:   03/12/19 0834  TempSrc:   PainSc: 0-No pain         Complications: No apparent anesthesia complications

## 2019-03-12 NOTE — H&P (Signed)
Melinda [name] is an 48 y.o. female.   Chief Complaint: Chronic depression and anxiety.  Not significantly different from usual.  No suicidal ideation no psychosis. HPI: Chronic depression and anxiety with poor response to medication but some degree of stability when ECT is added.  Past Medical History:  Diagnosis Date  . Depression   . Diabetes mellitus without complication ([place])   . Diabetic peripheral neuropathy ([place]) 03/07/15  . Diabetic peripheral neuropathy ([place]) 03/07/15  . Diabetic peripheral neuropathy ([place]) 03/07/15  . GERD (gastroesophageal reflux disease)   . Hypercholesterolemia 03/07/15  . Hypertension   . Obesity 03/07/15  . Personality disorder ([place]) 03/07/15  . Sinus tachycardia 03/07/15   history of  . Suicidal thoughts 03/07/15    Past Surgical History:  Procedure Laterality Date  . COLONOSCOPY WITH PROPOFOL N/A 12/01/2018   Procedure: COLONOSCOPY WITH PROPOFOL;  Surgeon: Jonathon Bellows, MD;  Location: [facility] ENDOSCOPY;  Service: Gastroenterology;  Laterality: N/A;  . electroconvulsion therapy  03/07/15    Family History  Problem Relation Age of Onset  . Hypertension Father   . Diabetes Mother    Social History:  reports that she has never smoked. She has never used smokeless tobacco. She reports that she does not drink alcohol or use drugs.  Allergies:  Allergies  Allergen Reactions  . Prednisone     Increases blood sugar    (Not in a hospital admission)   Results for orders placed or performed during the hospital encounter of 03/12/19 (from the past 48 hour(s))  Glucose, capillary     Status: None   Collection Time: 03/12/19  8:29 AM  Result Value Ref Range   Glucose-Capillary 95 70 - 99 mg/dL   No results found.  Review of Systems  Constitutional: Negative.   HENT: Negative.   Eyes: Negative.   Respiratory: Negative.   Cardiovascular: Negative.   Gastrointestinal: Negative.   Musculoskeletal: Negative.   Skin: Negative.   Neurological: Negative.    Psychiatric/Behavioral: Positive for depression. Negative for hallucinations, substance abuse and suicidal ideas.    Blood pressure (!) 143/87, pulse 93, temperature 97.8 F (36.6 C), temperature source Oral, resp. rate 18, weight 118.4 kg, SpO2 98 %. Physical Exam  Nursing note and vitals reviewed. Constitutional: She appears well-developed and well-nourished.  HENT:  Head: Normocephalic and atraumatic.  Eyes: Pupils are equal, round, and reactive to light. Conjunctivae are normal.  Neck: Normal range of motion.  Cardiovascular: Normal heart sounds.  Respiratory: Effort normal.  GI: Soft.  Musculoskeletal: Normal range of motion.  Neurological: She is alert.  Skin: Skin is warm and dry.  Psychiatric: She has a normal mood and affect. Her behavior is normal. Judgment and thought content normal.     Assessment/Plan Patient is stable on a two-week maintenance schedule and will return in 2 weeks for treatment after today  [name], MD 03/12/2019, 9:19 AM

## 2019-03-12 NOTE — Procedures (Signed)
ECT SERVICES Physician's Interval Evaluation & Treatment Note  Patient Identification: Melinda [name] MRN:  [id number] Date of Evaluation:  03/12/2019 TX #: [id number]  MADRS:   MMSE:   P.E. Findings:  No change to physical exam  Psychiatric Interval Note:  Chronic anxiety and depression  Subjective:  Patient is a 48 y.o. female seen for evaluation for Electroconvulsive Therapy. No change from baseline  Treatment Summary:   []   Right Unilateral             [x]  Bilateral   % Energy : 1.0 ms 35%   Impedance: 1140 ohms  Seizure Energy Index: 3723 V squared  Postictal Suppression Index: 83%  Seizure Concordance Index: 91%  Medications  Pre Shock: Robinul 0.4 mg Toradol 30 mg labetalol 20 mg as well 10 mg Brevital 70 mg succinylcholine 100 mg  Post Shock:    Seizure Duration: 31 seconds EMG 69 seconds EEG   Comments: Follow-up 2 weeks  Lungs:  [x]   Clear to auscultation               []  Other:   Heart:    [x]   Regular rhythm             []  irregular rhythm    [x]   Previous H&P reviewed, patient examined and there are NO CHANGES                 []   Previous H&P reviewed, patient examined and there are changes noted.   [name], MD 5/1/202010:24 AM

## 2019-03-13 NOTE — Addendum Note (Signed)
Addendum  created 03/13/19 1816 by Demetrius Charity, CRNA   Clinical Note Signed

## 2019-03-13 NOTE — Anesthesia Post-op Follow-up Note (Signed)
Anesthesia QCDR form completed.        

## 2019-03-24 ENCOUNTER — Telehealth: Payer: Self-pay

## 2019-03-25 ENCOUNTER — Other Ambulatory Visit: Payer: Self-pay | Admitting: Psychiatry

## 2019-03-26 ENCOUNTER — Encounter: Payer: Self-pay | Admitting: Certified Registered Nurse Anesthetist

## 2019-03-26 ENCOUNTER — Other Ambulatory Visit: Payer: Self-pay

## 2019-03-26 ENCOUNTER — Ambulatory Visit
Admission: RE | Admit: 2019-03-26 | Discharge: 2019-03-26 | Disposition: A | Discharge status: Home / Self Care | Payer: Medicare PPO | Source: Ambulatory Visit | Attending: Psychiatry | Admitting: Psychiatry

## 2019-03-26 DIAGNOSIS — F419 Anxiety disorder, unspecified: Secondary | ICD-10-CM | POA: Insufficient documentation

## 2019-03-26 DIAGNOSIS — F332 Major depressive disorder, recurrent severe without psychotic features: Secondary | ICD-10-CM

## 2019-03-26 DIAGNOSIS — K219 Gastro-esophageal reflux disease without esophagitis: Secondary | ICD-10-CM | POA: Diagnosis not present

## 2019-03-26 DIAGNOSIS — Z8249 Family history of ischemic heart disease and other diseases of the circulatory system: Secondary | ICD-10-CM | POA: Diagnosis not present

## 2019-03-26 DIAGNOSIS — I10 Essential (primary) hypertension: Secondary | ICD-10-CM | POA: Diagnosis not present

## 2019-03-26 DIAGNOSIS — E7800 Pure hypercholesterolemia, unspecified: Secondary | ICD-10-CM | POA: Diagnosis not present

## 2019-03-26 DIAGNOSIS — Z888 Allergy status to other drugs, medicaments and biological substances status: Secondary | ICD-10-CM | POA: Insufficient documentation

## 2019-03-26 DIAGNOSIS — E1140 Type 2 diabetes mellitus with diabetic neuropathy, unspecified: Secondary | ICD-10-CM | POA: Diagnosis not present

## 2019-03-26 DIAGNOSIS — G4730 Sleep apnea, unspecified: Secondary | ICD-10-CM | POA: Diagnosis not present

## 2019-03-26 DIAGNOSIS — Z6841 Body Mass Index (BMI) 40.0 and over, adult: Secondary | ICD-10-CM | POA: Diagnosis not present

## 2019-03-26 DIAGNOSIS — E1142 Type 2 diabetes mellitus with diabetic polyneuropathy: Secondary | ICD-10-CM | POA: Diagnosis not present

## 2019-03-26 DIAGNOSIS — E669 Obesity, unspecified: Secondary | ICD-10-CM | POA: Diagnosis not present

## 2019-03-26 DIAGNOSIS — F329 Major depressive disorder, single episode, unspecified: Secondary | ICD-10-CM | POA: Diagnosis not present

## 2019-03-26 DIAGNOSIS — Z833 Family history of diabetes mellitus: Secondary | ICD-10-CM | POA: Diagnosis not present

## 2019-03-26 LAB — GLUCOSE, CAPILLARY
Glucose-Capillary: 96 mg/dL (ref 70–99)
Glucose-Capillary: 124 mg/dL — ABNORMAL HIGH (ref 70–99)

## 2019-03-26 MED ORDER — GLYCOPYRROLATE 0.2 MG/ML IJ SOLN
0.4000 mg | Freq: Once | INTRAMUSCULAR | Status: AC
  Administered 2019-03-26: 08:00:00 0.4 mg via INTRAVENOUS

## 2019-03-26 MED ORDER — LABETALOL HCL 5 MG/ML IV SOLN
INTRAVENOUS | Status: DC | PRN
  Administered 2019-03-26: 30 mg via INTRAVENOUS

## 2019-03-26 MED ORDER — SODIUM CHLORIDE 0.9 % IV SOLN
INTRAVENOUS | Status: DC | PRN
  Administered 2019-03-26: 09:00:00 via INTRAVENOUS

## 2019-03-26 MED ORDER — SUCCINYLCHOLINE CHLORIDE 20 MG/ML IJ SOLN
INTRAMUSCULAR | Status: DC | PRN
  Administered 2019-03-26: 100 mg via INTRAVENOUS

## 2019-03-26 MED ORDER — METHOHEXITAL SODIUM 100 MG/10ML IV SOSY
PREFILLED_SYRINGE | INTRAVENOUS | Status: DC | PRN
  Administered 2019-03-26: 70 mg via INTRAVENOUS

## 2019-03-26 MED ORDER — KETOROLAC TROMETHAMINE 30 MG/ML IJ SOLN
30.0000 mg | Freq: Once | INTRAMUSCULAR | Status: AC
  Administered 2019-03-26: 30 mg via INTRAVENOUS

## 2019-03-26 MED ORDER — ESMOLOL HCL 100 MG/10ML IV SOLN
INTRAVENOUS | Status: DC | PRN
  Administered 2019-03-26: 20 mg via INTRAVENOUS

## 2019-03-26 MED ORDER — SODIUM CHLORIDE 0.9 % IV SOLN
500.0000 mL | Freq: Once | INTRAVENOUS | Status: AC
  Administered 2019-03-26: 500 mL via INTRAVENOUS

## 2019-03-26 NOTE — Anesthesia Postprocedure Evaluation (Signed)
Anesthesia Post Note  Patient: Melinda [name]  Procedure(s) Performed: ECT TX  Patient location during evaluation: PACU Anesthesia Type: General Level of consciousness: awake and alert Pain management: pain level controlled Vital Signs Assessment: post-procedure vital signs reviewed and stable Respiratory status: spontaneous breathing, nonlabored ventilation, respiratory function stable and patient connected to nasal cannula oxygen Cardiovascular status: blood pressure returned to baseline and stable Postop Assessment: no apparent nausea or vomiting Anesthetic complications: no     Last Vitals:  Vitals:   03/26/19 0922 03/26/19 0928  BP: (!) 152/91 (!) 146/89  Pulse:    Resp:  18  Temp:    SpO2:      Last Pain:  Vitals:   03/26/19 0928  TempSrc: Oral  PainSc: 0-No pain                 Precious Haws Kinsey Cowsert

## 2019-03-26 NOTE — Anesthesia Preprocedure Evaluation (Signed)
Anesthesia Evaluation  Patient identified by MRN, date of birth, ID band Patient awake    Reviewed: Allergy & Precautions, H&P , NPO status , Patient's Chart, lab work & pertinent test results, reviewed documented beta blocker date and time   History of Anesthesia Complications Negative for: history of anesthetic complications  Airway Mallampati: III  TM Distance: >3 FB Neck ROM: limited    Dental  (+) Chipped   Pulmonary neg shortness of breath, sleep apnea ,           Cardiovascular Exercise Tolerance: Poor hypertension, On Medications      Neuro/Psych PSYCHIATRIC DISORDERS Depression Bipolar Disorder  Neuromuscular disease    GI/Hepatic Neg liver ROS, GERD  Medicated and Controlled,  Endo/Other  diabetes, Type 2Morbid obesity  Renal/GU negative Renal ROS  negative genitourinary   Musculoskeletal   Abdominal   Peds  Hematology negative hematology ROS (+)   Anesthesia Other Findings Past Medical History: No date: Depression No date: Diabetes mellitus without complication ([place]) 0/94/07: Diabetic peripheral neuropathy ([place]) 03/07/15: Diabetic peripheral neuropathy ([place]) 03/07/15: Diabetic peripheral neuropathy (HCC) No date: GERD (gastroesophageal reflux disease) 03/07/15: Hypercholesterolemia No date: Hypertension 03/07/15: Obesity 03/07/15: Personality disorder ([place]) 03/07/15: Sinus tachycardia     Comment:  history of 03/07/15: Suicidal thoughts Past Surgical History: 12/01/2018: COLONOSCOPY WITH PROPOFOL; N/A     Comment:  Procedure: COLONOSCOPY WITH PROPOFOL;  Surgeon: Jonathon Bellows, MD;  Location: [facility] ENDOSCOPY;  Service:               Gastroenterology;  Laterality: N/A; 03/07/15: electroconvulsion therapy   Reproductive/Obstetrics negative OB ROS                             Anesthesia Physical  Anesthesia Plan  ASA: III  Anesthesia Plan: General   Post-op Pain  Management:    Induction: Inhalational  PONV Risk Score and Plan:   Airway Management Planned: Mask  Additional Equipment:   Intra-op Plan:   Post-operative Plan:   Informed Consent: I have reviewed the patients History and Physical, chart, labs and discussed the procedure including the risks, benefits and alternatives for the proposed anesthesia with the patient or authorized representative who has indicated his/her understanding and acceptance.     Dental Advisory Given  Plan Discussed with: Anesthesiologist  Anesthesia Plan Comments: (Patient consented for risks of anesthesia including but not limited to:  - adverse reactions to medications - risk of intubation if required - damage to teeth, lips or other oral mucosa - sore throat or hoarseness - Damage to heart, brain, lungs or loss of life  Patient voiced understanding.)        Anesthesia Quick Evaluation

## 2019-03-26 NOTE — Transfer of Care (Signed)
Immediate Anesthesia Transfer of Care Note  Patient: Melinda [name]  Procedure(s) Performed: ECT TX  Patient Location: PACU  Anesthesia Type:General  Level of Consciousness: awake and alert   Airway & Oxygen Therapy: Pt spontaneously breathing and connected to facemask oxygen  Post-op Assessment: Report given to RN and Post -op Vital signs reviewed and stable  Post vital signs: Reviewed and stable  Last Vitals:  Vitals Value Taken Time  BP    Temp    Pulse 96 03/26/2019  8:56 AM  Resp 22 03/26/2019  8:56 AM  SpO2 100 % 03/26/2019  8:56 AM  Vitals shown include unvalidated device data.  Last Pain:  Vitals:   03/26/19 0806  TempSrc: Oral  PainSc: 0-No pain         Complications: No apparent anesthesia complications

## 2019-03-26 NOTE — Discharge Instructions (Signed)
1)  The drugs that you have been given will stay in your system until tomorrow so for the       next 24 hours you should not:  A. Drive an automobile  B. Make any legal decisions  C. Drink any alcoholic beverages  2)  You may resume your regular meals upon return home.  3)  A responsible adult must take you home.  Someone should stay with you for a few          hours, then be available by phone for the remainder of the treatment day.  4)  You May experience any of the following symptoms:  Headache, Nausea and a dry mouth (due to the medications you were given),  temporary memory loss and some confusion, or sore muscles (a warm bath  should help this).  If you you experience any of these symptoms let us know on                your return visit.  5)  Report any of the following: any acute discomfort, severe headache, or temperature        greater than 100.5 F.   Also report any unusual redness, swelling, drainage, or pain         at your IV site.    You may report Symptoms to:  [place] at [facility]          Phone: [phone], ECT Department           or Dr. Prescott Gum office [phone]  6)  Your next ECT Treatment is Friday May 15  We will call 2 days prior to your scheduled appointment for arrival times.  7)  Nothing to eat or drink after midnight the night before your procedure.  8)  Take      With a sip of water the morning of your procedure.  9)  Other Instructions: Call [phone] to cancel the morning of your procedure due         to illness or emergency.  10) We will call within 72 hours to assess how you are feeling.

## 2019-03-26 NOTE — H&P (Signed)
Melinda [name] is an 48 y.o. female.   Chief Complaint: Chronic depression and anxiety not significantly worse than usual no psychosis no suicidal ideation no new physical complaints HPI: Chronic anxiety recurrent severe depression.  Good control with medication and ECT  Past Medical History:  Diagnosis Date  . Depression   . Diabetes mellitus without complication ([place])   . Diabetic peripheral neuropathy ([place]) 03/07/15  . Diabetic peripheral neuropathy ([place]) 03/07/15  . Diabetic peripheral neuropathy ([place]) 03/07/15  . GERD (gastroesophageal reflux disease)   . Hypercholesterolemia 03/07/15  . Hypertension   . Obesity 03/07/15  . Personality disorder ([place]) 03/07/15  . Sinus tachycardia 03/07/15   history of  . Suicidal thoughts 03/07/15    Past Surgical History:  Procedure Laterality Date  . COLONOSCOPY WITH PROPOFOL N/A 12/01/2018   Procedure: COLONOSCOPY WITH PROPOFOL;  Surgeon: Jonathon Bellows, MD;  Location: [facility] ENDOSCOPY;  Service: Gastroenterology;  Laterality: N/A;  . electroconvulsion therapy  03/07/15    Family History  Problem Relation Age of Onset  . Hypertension Father   . Diabetes Mother    Social History:  reports that she has never smoked. She has never used smokeless tobacco. She reports that she does not drink alcohol or use drugs.  Allergies:  Allergies  Allergen Reactions  . Prednisone     Increases blood sugar    (Not in a hospital admission)   No results found for this or any previous visit (from the past 48 hour(s)). No results found.  Review of Systems  Constitutional: Negative.   HENT: Negative.   Eyes: Negative.   Respiratory: Negative.   Cardiovascular: Negative.   Gastrointestinal: Negative.   Musculoskeletal: Negative.   Skin: Negative.   Neurological: Negative.   Psychiatric/Behavioral: Negative.     Blood pressure (!) 161/90, pulse 81, temperature 98.1 F (36.7 C), temperature source Oral, resp. rate 16, SpO2 98 %. Physical Exam   Nursing note and vitals reviewed. Constitutional: She appears well-developed and well-nourished.  HENT:  Head: Normocephalic and atraumatic.  Eyes: Pupils are equal, round, and reactive to light. Conjunctivae are normal.  Neck: Normal range of motion.  Cardiovascular: Normal heart sounds.  Respiratory: Effort normal.  GI: Soft.  Musculoskeletal: Normal range of motion.  Neurological: She is alert.  Skin: Skin is warm and dry.  Psychiatric: She has a normal mood and affect. Her behavior is normal. Judgment and thought content normal.     Assessment/Plan Tolerating ECT very well.  No new complaints.  Patient agrees to ongoing treatment plan of every other week treatment we will see her back in 2 weeks after today.  [name], MD 03/26/2019, 8:33 AM

## 2019-03-26 NOTE — Procedures (Signed)
ECT SERVICES Physician's Interval Evaluation & Treatment Note  Patient Identification: Melinda [name] MRN:  [id number] Date of Evaluation:  03/26/2019 TX #: [id number]  MADRS:   MMSE:   P.E. Findings:  No change to physical exam.  Vital stable.  Psychiatric Interval Note:  Patient reports some increased stress.  Her son is moving out of the house.  Depression symptoms however remained stable.  No suicidality no self injury.  Managing to function as well as usual  Subjective:  Patient is a 48 y.o. female seen for evaluation for Electroconvulsive Therapy. Feels like things are going about as well as they normally do  Treatment Summary:   []   Right Unilateral             [x]  Bilateral   % Energy : 1.0 ms, 35%   Impedance: 1290 ohms  Seizure Energy Index: 3126 V squared  Postictal Suppression Index: 66%  Seizure Concordance Index: 90%  Medications  Pre Shock: Robinul 0.4 mg Toradol 30 mg labetalol 30 mg esmolol 20 mg Brevital 70 mg succinylcholine 100 mg  Post Shock:    Seizure Duration: 28 seconds by EMG 52 seconds by EEG   Comments: Treatment completed without complication.  Patient is discharged home and will follow up with [place] in 2 weeks.  Lungs:  [x]   Clear to auscultation               []  Other:   Heart:    [x]   Regular rhythm             []  irregular rhythm    [x]   Previous H&P reviewed, patient examined and there are NO CHANGES                 []   Previous H&P reviewed, patient examined and there are changes noted.   [name], MD 5/15/20209:51 AM

## 2019-03-26 NOTE — Anesthesia Procedure Notes (Addendum)
Performed by: Demetrius Charity, CRNA Pre-anesthesia Checklist: Patient identified, Emergency Drugs available, Suction available, Patient being monitored and Timeout performed Patient Re-evaluated:Patient Re-evaluated prior to induction Oxygen Delivery Method: Circle system utilized Induction Type: IV induction Airway Equipment and Method: Bite block

## 2019-04-07 ENCOUNTER — Telehealth: Payer: Self-pay

## 2019-04-08 ENCOUNTER — Other Ambulatory Visit: Payer: Self-pay | Admitting: Psychiatry

## 2019-04-09 ENCOUNTER — Encounter: Payer: Self-pay | Admitting: Anesthesiology

## 2019-04-09 ENCOUNTER — Other Ambulatory Visit: Payer: Self-pay

## 2019-04-09 ENCOUNTER — Encounter (HOSPITAL_BASED_OUTPATIENT_CLINIC_OR_DEPARTMENT_OTHER)
Admission: RE | Admit: 2019-04-09 | Discharge: 2019-04-09 | Disposition: A | Discharge status: Home / Self Care | Payer: Medicare PPO | Source: Ambulatory Visit | Attending: Psychiatry | Admitting: Psychiatry

## 2019-04-09 DIAGNOSIS — E1140 Type 2 diabetes mellitus with diabetic neuropathy, unspecified: Secondary | ICD-10-CM | POA: Diagnosis not present

## 2019-04-09 DIAGNOSIS — Z833 Family history of diabetes mellitus: Secondary | ICD-10-CM | POA: Diagnosis not present

## 2019-04-09 DIAGNOSIS — G4730 Sleep apnea, unspecified: Secondary | ICD-10-CM | POA: Diagnosis not present

## 2019-04-09 DIAGNOSIS — Z8249 Family history of ischemic heart disease and other diseases of the circulatory system: Secondary | ICD-10-CM | POA: Diagnosis not present

## 2019-04-09 DIAGNOSIS — K219 Gastro-esophageal reflux disease without esophagitis: Secondary | ICD-10-CM | POA: Diagnosis not present

## 2019-04-09 DIAGNOSIS — Z6841 Body Mass Index (BMI) 40.0 and over, adult: Secondary | ICD-10-CM | POA: Diagnosis not present

## 2019-04-09 DIAGNOSIS — E7800 Pure hypercholesterolemia, unspecified: Secondary | ICD-10-CM | POA: Diagnosis not present

## 2019-04-09 DIAGNOSIS — I10 Essential (primary) hypertension: Secondary | ICD-10-CM | POA: Diagnosis not present

## 2019-04-09 DIAGNOSIS — F332 Major depressive disorder, recurrent severe without psychotic features: Secondary | ICD-10-CM

## 2019-04-09 DIAGNOSIS — F329 Major depressive disorder, single episode, unspecified: Secondary | ICD-10-CM | POA: Diagnosis not present

## 2019-04-09 DIAGNOSIS — E1142 Type 2 diabetes mellitus with diabetic polyneuropathy: Secondary | ICD-10-CM | POA: Diagnosis not present

## 2019-04-09 DIAGNOSIS — Z888 Allergy status to other drugs, medicaments and biological substances status: Secondary | ICD-10-CM | POA: Diagnosis not present

## 2019-04-09 DIAGNOSIS — F419 Anxiety disorder, unspecified: Secondary | ICD-10-CM | POA: Diagnosis not present

## 2019-04-09 DIAGNOSIS — E669 Obesity, unspecified: Secondary | ICD-10-CM | POA: Diagnosis not present

## 2019-04-09 LAB — GLUCOSE, CAPILLARY
Glucose-Capillary: 126 mg/dL — ABNORMAL HIGH (ref 70–99)
Glucose-Capillary: 147 mg/dL — ABNORMAL HIGH (ref 70–99)

## 2019-04-09 MED ORDER — METHOHEXITAL SODIUM 0.5 G IJ SOLR
INTRAMUSCULAR | Status: AC
  Filled 2019-04-09: qty 500

## 2019-04-09 MED ORDER — METHOHEXITAL SODIUM 100 MG/10ML IV SOSY
PREFILLED_SYRINGE | INTRAVENOUS | Status: DC | PRN
  Administered 2019-04-09: 70 mg via INTRAVENOUS

## 2019-04-09 MED ORDER — ESMOLOL HCL 100 MG/10ML IV SOLN
INTRAVENOUS | Status: DC | PRN
  Administered 2019-04-09: 20 mg via INTRAVENOUS

## 2019-04-09 MED ORDER — GLYCOPYRROLATE 0.2 MG/ML IJ SOLN
0.4000 mg | Freq: Once | INTRAMUSCULAR | Status: AC
  Administered 2019-04-09: 09:00:00 0.4 mg via INTRAVENOUS

## 2019-04-09 MED ORDER — ESMOLOL HCL 100 MG/10ML IV SOLN
INTRAVENOUS | Status: AC
  Filled 2019-04-09: qty 10

## 2019-04-09 MED ORDER — SODIUM CHLORIDE 0.9 % IV SOLN
500.0000 mL | Freq: Once | INTRAVENOUS | Status: AC
  Administered 2019-04-09: 500 mL via INTRAVENOUS

## 2019-04-09 MED ORDER — SUCCINYLCHOLINE CHLORIDE 20 MG/ML IJ SOLN
INTRAMUSCULAR | Status: DC | PRN
  Administered 2019-04-09: 100 mg via INTRAVENOUS

## 2019-04-09 MED ORDER — GLYCOPYRROLATE 0.2 MG/ML IJ SOLN
INTRAMUSCULAR | Status: AC
  Administered 2019-04-09: 09:00:00 0.4 mg via INTRAVENOUS
  Filled 2019-04-09: qty 2

## 2019-04-09 MED ORDER — LABETALOL HCL 5 MG/ML IV SOLN
INTRAVENOUS | Status: AC
  Filled 2019-04-09: qty 4

## 2019-04-09 MED ORDER — KETAMINE HCL 10 MG/ML IJ SOLN: INTRAMUSCULAR | Status: DC | PRN

## 2019-04-09 MED ORDER — KETOROLAC TROMETHAMINE 30 MG/ML IJ SOLN
30.0000 mg | Freq: Once | INTRAMUSCULAR | Status: AC
  Administered 2019-04-09: 09:00:00 30 mg via INTRAVENOUS

## 2019-04-09 MED ORDER — LABETALOL HCL 5 MG/ML IV SOLN
INTRAVENOUS | Status: DC | PRN
  Administered 2019-04-09: 20 mg via INTRAVENOUS
  Administered 2019-04-09: 10 mg via INTRAVENOUS

## 2019-04-09 MED ORDER — SUCCINYLCHOLINE CHLORIDE 20 MG/ML IJ SOLN
INTRAMUSCULAR | Status: AC
  Filled 2019-04-09: qty 1

## 2019-04-09 MED ORDER — KETOROLAC TROMETHAMINE 30 MG/ML IJ SOLN
INTRAMUSCULAR | Status: AC
  Administered 2019-04-09: 30 mg via INTRAVENOUS
  Filled 2019-04-09: qty 1

## 2019-04-09 MED ORDER — SODIUM CHLORIDE 0.9 % IV SOLN
INTRAVENOUS | Status: DC | PRN
  Administered 2019-04-09: 11:00:00 via INTRAVENOUS

## 2019-04-09 NOTE — Anesthesia Post-op Follow-up Note (Signed)
Anesthesia QCDR form completed.        

## 2019-04-09 NOTE — H&P (Signed)
Melinda [name] is an 48 y.o. female.   Chief Complaint: Patient has no new complaint.  Chronic low mood HPI: Chronic anxiety and depression  Past Medical History:  Diagnosis Date  . Depression   . Diabetes mellitus without complication ([place])   . Diabetic peripheral neuropathy ([place]) 03/07/15  . Diabetic peripheral neuropathy ([place]) 03/07/15  . Diabetic peripheral neuropathy ([place]) 03/07/15  . GERD (gastroesophageal reflux disease)   . Hypercholesterolemia 03/07/15  . Hypertension   . Obesity 03/07/15  . Personality disorder ([place]) 03/07/15  . Sinus tachycardia 03/07/15   history of  . Suicidal thoughts 03/07/15    Past Surgical History:  Procedure Laterality Date  . COLONOSCOPY WITH PROPOFOL N/A 12/01/2018   Procedure: COLONOSCOPY WITH PROPOFOL;  Surgeon: Jonathon Bellows, MD;  Location: [facility] ENDOSCOPY;  Service: Gastroenterology;  Laterality: N/A;  . electroconvulsion therapy  03/07/15    Family History  Problem Relation Age of Onset  . Hypertension Father   . Diabetes Mother    Social History:  reports that she has never smoked. She has never used smokeless tobacco. She reports that she does not drink alcohol or use drugs.  Allergies:  Allergies  Allergen Reactions  . Prednisone     Increases blood sugar    (Not in a hospital admission)   Results for orders placed or performed during the hospital encounter of 04/09/19 (from the past 48 hour(s))  Glucose, capillary     Status: Abnormal   Collection Time: 04/09/19  8:43 AM  Result Value Ref Range   Glucose-Capillary 126 (H) 70 - 99 mg/dL   No results found.  Review of Systems  Constitutional: Negative.   HENT: Negative.   Eyes: Negative.   Respiratory: Negative.   Cardiovascular: Negative.   Gastrointestinal: Negative.   Musculoskeletal: Negative.   Skin: Negative.   Neurological: Negative.   Psychiatric/Behavioral: Negative.     Blood pressure (!) 150/83, pulse 87, temperature 98.2 F (36.8 C), temperature source  Oral, resp. rate 18, height 5\' 6"  (1.676 m), weight 116.1 kg, SpO2 98 %. Physical Exam  Nursing note and vitals reviewed. Constitutional: She appears well-developed and well-nourished.  HENT:  Head: Normocephalic and atraumatic.  Eyes: Pupils are equal, round, and reactive to light. Conjunctivae are normal.  Neck: Normal range of motion.  Cardiovascular: Regular rhythm and normal heart sounds.  Respiratory: Effort normal.  GI: Soft.  Musculoskeletal: Normal range of motion.  Neurological: She is alert.  Skin: Skin is warm and dry.  Psychiatric: She has a normal mood and affect. Her behavior is normal. Judgment and thought content normal.     Assessment/Plan Treatment today and follow-up with the usual 2-week schedule  [name], MD 04/09/2019, 11:16 AM

## 2019-04-09 NOTE — Transfer of Care (Signed)
Immediate Anesthesia Transfer of Care Note  Patient: Melinda [name]  Procedure(s) Performed: ECT TX  Patient Location: PACU  Anesthesia Type:General  Level of Consciousness: awake  Airway & Oxygen Therapy: Patient Spontanous Breathing and Patient connected to face mask oxygen  Post-op Assessment: Report given to RN and Post -op Vital signs reviewed and stable  Post vital signs: Reviewed and stable  Last Vitals:  Vitals Value Taken Time  BP 141/101 04/09/2019 11:38 AM  Temp 36.7 C 04/09/2019 11:37 AM  Pulse 99 04/09/2019 11:40 AM  Resp 17 04/09/2019 11:40 AM  SpO2 99 % 04/09/2019 11:40 AM  Vitals shown include unvalidated device data.  Last Pain:  Vitals:   04/09/19 1137  TempSrc:   PainSc: 0-No pain         Complications: No apparent anesthesia complications

## 2019-04-09 NOTE — Procedures (Signed)
ECT SERVICES Physician's Interval Evaluation & Treatment Note  Patient Identification: CIMBERLY STOFFEL MRN:  [id number] Date of Evaluation:  04/09/2019 TX #: [id number]  MADRS:   MMSE:   P.E. Findings:  No change in physical exam  Psychiatric Interval Note:  Chronic depression  Subjective:  Patient is a 48 y.o. female seen for evaluation for Electroconvulsive Therapy. About the same as usual no suicidal or psychotic symptoms  Treatment Summary:   []   Right Unilateral             [x]  Bilateral   % Energy : 1.0 ms 35%   Impedance: 1200 ohms  Seizure Energy Index: 3390 V squared  Postictal Suppression Index: 56%  Seizure Concordance Index: 89%  Medications  Pre Shock: Robinul 0.4 mg Brevital 70 mg Toradol 30 mg labetalol 30 mg esmolol 20 mg succinylcholine 100 mg  Post Shock:    Seizure Duration: 37 seconds EMG 60 seconds EEG   Comments: Follow-up 2 weeks  Lungs:  [x]   Clear to auscultation               []  Other:   Heart:    [x]   Regular rhythm             []  irregular rhythm    [x]   Previous H&P reviewed, patient examined and there are NO CHANGES                 []   Previous H&P reviewed, patient examined and there are changes noted.   [name], MD 5/29/202011:19 AM

## 2019-04-09 NOTE — Anesthesia Postprocedure Evaluation (Signed)
Anesthesia Post Note  Patient: Melinda [name]  Procedure(s) Performed: ECT TX  Patient location during evaluation: PACU Anesthesia Type: General Level of consciousness: awake and alert Pain management: pain level controlled Vital Signs Assessment: post-procedure vital signs reviewed and stable Respiratory status: spontaneous breathing, nonlabored ventilation and respiratory function stable Cardiovascular status: blood pressure returned to baseline and stable Postop Assessment: no apparent nausea or vomiting Anesthetic complications: no     Last Vitals:  Vitals:   04/09/19 1200 04/09/19 1203  BP: 116/73 116/73  Pulse: 98 98  Resp: (!) 30 20  Temp:    SpO2: 97%     Last Pain:  Vitals:   04/09/19 1203  TempSrc:   PainSc: 0-No pain                 Alphonsus Sias

## 2019-04-09 NOTE — Anesthesia Preprocedure Evaluation (Signed)
Anesthesia Evaluation  Patient identified by MRN, date of birth, ID band Patient awake    Reviewed: Allergy & Precautions, H&P , NPO status , reviewed documented beta blocker date and time   Airway Mallampati: III  TM Distance: >3 FB Neck ROM: limited    Dental  (+) Chipped   Pulmonary sleep apnea ,    Pulmonary exam normal        Cardiovascular hypertension, Normal cardiovascular exam  1st degree AVB on EKG   Neuro/Psych PSYCHIATRIC DISORDERS Depression Bipolar Disorder  Neuromuscular disease    GI/Hepatic GERD  Medicated and Controlled,  Endo/Other  diabetesMorbid obesity  Renal/GU      Musculoskeletal   Abdominal   Peds  Hematology   Anesthesia Other Findings Past Medical History: No date: Depression No date: Diabetes mellitus without complication ([place]) 05/22/44: Diabetic peripheral neuropathy ([place]) 03/07/15: Diabetic peripheral neuropathy ([place]) 03/07/15: Diabetic peripheral neuropathy (HCC) No date: GERD (gastroesophageal reflux disease) 03/07/15: Hypercholesterolemia No date: Hypertension 03/07/15: Obesity 03/07/15: Personality disorder ([place]) 03/07/15: Sinus tachycardia     Comment:  history of 03/07/15: Suicidal thoughts  Past Surgical History: 12/01/2018: COLONOSCOPY WITH PROPOFOL; N/A     Comment:  Procedure: COLONOSCOPY WITH PROPOFOL;  Surgeon: Jonathon Bellows, MD;  Location: [facility] ENDOSCOPY;  Service:               Gastroenterology;  Laterality: N/A; 03/07/15: electroconvulsion therapy  BMI    Body Mass Index:  41.32 kg/m      Reproductive/Obstetrics                             Anesthesia Physical Anesthesia Plan  ASA: III  Anesthesia Plan: General   Post-op Pain Management:    Induction: Intravenous  PONV Risk Score and Plan: Treatment may vary due to age or medical condition and TIVA  Airway Management Planned: Nasal Cannula and Natural  Airway  Additional Equipment:   Intra-op Plan:   Post-operative Plan:   Informed Consent: I have reviewed the patients History and Physical, chart, labs and discussed the procedure including the risks, benefits and alternatives for the proposed anesthesia with the patient or authorized representative who has indicated his/her understanding and acceptance.     Dental Advisory Given  Plan Discussed with: CRNA  Anesthesia Plan Comments:         Anesthesia Quick Evaluation

## 2019-04-09 NOTE — Discharge Instructions (Signed)
1)  The drugs that you have been given will stay in your system until tomorrow so for the       next 24 hours you should not:  A. Drive an automobile  B. Make any legal decisions  C. Drink any alcoholic beverages  2)  You may resume your regular meals upon return home.  3)  A responsible adult must take you home.  Someone should stay with you for a few          hours, then be available by phone for the remainder of the treatment day.  4)  You May experience any of the following symptoms:  Headache, Nausea and a dry mouth (due to the medications you were given),  temporary memory loss and some confusion, or sore muscles (a warm bath  should help this).  If you you experience any of these symptoms let us know on                your return visit.  5)  Report any of the following: any acute discomfort, severe headache, or temperature        greater than 100.5 F.   Also report any unusual redness, swelling, drainage, or pain         at your IV site.    You may report Symptoms to:  [place] at [facility]          Phone: [phone], ECT Department           or Dr. Prescott Gum office [phone]  6)  Your next ECT Treatment is Friday June 12   We will call 2 days prior to your scheduled appointment for arrival times.  7)  Nothing to eat or drink after midnight the night before your procedure.  8)  Take Lisinopril    With a sip of water the morning of your procedure.  9)  Other Instructions: Call [phone] to cancel the morning of your procedure due         to illness or emergency.  10) We will call within 72 hours to assess how you are feeling.

## 2019-04-10 ENCOUNTER — Other Ambulatory Visit: Payer: Self-pay | Admitting: Physician Assistant

## 2019-04-10 ENCOUNTER — Other Ambulatory Visit: Payer: Self-pay | Admitting: Psychiatry

## 2019-04-10 DIAGNOSIS — I10 Essential (primary) hypertension: Secondary | ICD-10-CM

## 2019-04-14 ENCOUNTER — Ambulatory Visit: Payer: Self-pay

## 2019-04-14 DIAGNOSIS — F332 Major depressive disorder, recurrent severe without psychotic features: Secondary | ICD-10-CM

## 2019-04-14 DIAGNOSIS — I10 Essential (primary) hypertension: Secondary | ICD-10-CM

## 2019-04-14 DIAGNOSIS — E1142 Type 2 diabetes mellitus with diabetic polyneuropathy: Secondary | ICD-10-CM

## 2019-04-14 NOTE — Chronic Care Management (AMB) (Signed)
   Chronic Care Management   Unsuccessful Call Note 04/14/2019 Name: Melinda [name] MRN: [id number] DOB: [birth year]   Melinda [name] is a 48 year old femalewho sees Melinda [name], Melinda [name] for primary care. Melinda [name] the CCM team to consult the patient for chronic case management secondary to DM education. Patient has a history of but not limited to Severe Depression, Hypercholesteremia, DM, cardiomyopathy, and sleep Apnea. Patient agreed to services 03/01/2019 but did not want to begin services until she was able to meet with RN CM face to face.  Was unable to reach patient via telephone today for follow up to see if patient would like to do telephone assessment and goal setting as RN CM is unsure when face to face services will begin. I have left HIPAA compliant voicemail asking patient to return my call. (unsuccessful outreach #1).   Plan: Will follow-up within 7 business days via telephone.    Moyinoluwa Dawe E. Rollene Rotunda, RN, BSN Nurse Care Coordinator [facility] Care Management [phone]

## 2019-04-19 ENCOUNTER — Other Ambulatory Visit: Payer: Self-pay | Admitting: Psychiatry

## 2019-04-21 ENCOUNTER — Ambulatory Visit: Payer: Self-pay

## 2019-04-21 ENCOUNTER — Telehealth: Payer: Self-pay | Admitting: *Deleted

## 2019-04-21 DIAGNOSIS — F332 Major depressive disorder, recurrent severe without psychotic features: Secondary | ICD-10-CM

## 2019-04-21 DIAGNOSIS — E1142 Type 2 diabetes mellitus with diabetic polyneuropathy: Secondary | ICD-10-CM

## 2019-04-21 NOTE — Chronic Care Management (AMB) (Signed)
   Chronic Care Management   Unsuccessful Call Note 04/14/2019 Name: Melinda [name]       MRN: [id number]       DOB: August 22, 1971  Melinda M. 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 Ms. [name] the CCM team to consult the patient forchronic case management secondary to DM education. Patient has a history of but not limited toSevere Depression, Hypercholesteremia, DM, cardiomyopathy, and sleep Apnea.Patient agreed to services 03/01/2019 but did not want to begin services until she was able to meet with RN CM face to face.  Was unable to reach patient via telephone today forfollow up to see if patient would like to do telephone assessment and goal setting as RN CM is unsure when face to face services will begin. Ihave left HIPAA compliant voicemail asking patient to return my call. (unsuccessful outreach #2).  Plan: Will follow-up within 7business days via telephone.   [name] E. Rollene Rotunda, RN, BSN Nurse Care Coordinator [facility] Management [phone]

## 2019-04-22 ENCOUNTER — Other Ambulatory Visit: Payer: Self-pay | Admitting: Psychiatry

## 2019-04-23 ENCOUNTER — Other Ambulatory Visit: Payer: Self-pay

## 2019-04-23 ENCOUNTER — Encounter
Admission: RE | Admit: 2019-04-23 | Discharge: 2019-04-23 | Disposition: A | Discharge status: Home / Self Care | Payer: Medicare PPO | Source: Ambulatory Visit | Attending: Psychiatry | Admitting: Psychiatry

## 2019-04-23 ENCOUNTER — Encounter: Payer: Self-pay | Admitting: Anesthesiology

## 2019-04-23 ENCOUNTER — Ambulatory Visit: Payer: Self-pay | Admitting: Anesthesiology

## 2019-04-23 ENCOUNTER — Other Ambulatory Visit
Admission: RE | Admit: 2019-04-23 | Discharge: 2019-04-23 | Disposition: A | Discharge status: Home / Self Care | Payer: Medicare PPO | Source: Ambulatory Visit | Attending: Psychiatry | Admitting: Psychiatry

## 2019-04-23 DIAGNOSIS — Z1159 Encounter for screening for other viral diseases: Secondary | ICD-10-CM | POA: Diagnosis not present

## 2019-04-23 DIAGNOSIS — F329 Major depressive disorder, single episode, unspecified: Secondary | ICD-10-CM | POA: Diagnosis not present

## 2019-04-23 DIAGNOSIS — E7800 Pure hypercholesterolemia, unspecified: Secondary | ICD-10-CM | POA: Diagnosis not present

## 2019-04-23 DIAGNOSIS — E1142 Type 2 diabetes mellitus with diabetic polyneuropathy: Secondary | ICD-10-CM | POA: Diagnosis not present

## 2019-04-23 DIAGNOSIS — Z888 Allergy status to other drugs, medicaments and biological substances status: Secondary | ICD-10-CM | POA: Insufficient documentation

## 2019-04-23 DIAGNOSIS — Z01812 Encounter for preprocedural laboratory examination: Secondary | ICD-10-CM | POA: Diagnosis not present

## 2019-04-23 DIAGNOSIS — G4730 Sleep apnea, unspecified: Secondary | ICD-10-CM | POA: Diagnosis not present

## 2019-04-23 DIAGNOSIS — I10 Essential (primary) hypertension: Secondary | ICD-10-CM | POA: Insufficient documentation

## 2019-04-23 DIAGNOSIS — F332 Major depressive disorder, recurrent severe without psychotic features: Secondary | ICD-10-CM | POA: Diagnosis not present

## 2019-04-23 DIAGNOSIS — K219 Gastro-esophageal reflux disease without esophagitis: Secondary | ICD-10-CM | POA: Diagnosis not present

## 2019-04-23 DIAGNOSIS — E1140 Type 2 diabetes mellitus with diabetic neuropathy, unspecified: Secondary | ICD-10-CM | POA: Diagnosis not present

## 2019-04-23 LAB — SARS CORONAVIRUS 2 BY RT PCR (HOSPITAL ORDER, PERFORMED IN ~~LOC~~ HOSPITAL LAB): SARS Coronavirus 2: NEGATIVE

## 2019-04-23 LAB — GLUCOSE, CAPILLARY: Glucose-Capillary: 125 mg/dL — ABNORMAL HIGH (ref 70–99)

## 2019-04-23 MED ORDER — ESMOLOL HCL 100 MG/10ML IV SOLN
INTRAVENOUS | Status: DC | PRN
  Administered 2019-04-23: 20 mg via INTRAVENOUS

## 2019-04-23 MED ORDER — SUCCINYLCHOLINE CHLORIDE 200 MG/10ML IV SOSY
PREFILLED_SYRINGE | INTRAVENOUS | Status: DC | PRN
  Administered 2019-04-23: 100 mg via INTRAVENOUS

## 2019-04-23 MED ORDER — GLYCOPYRROLATE 0.2 MG/ML IJ SOLN
0.4000 mg | Freq: Once | INTRAMUSCULAR | Status: AC
  Administered 2019-04-23: 0.4 mg via INTRAVENOUS

## 2019-04-23 MED ORDER — KETOROLAC TROMETHAMINE 30 MG/ML IJ SOLN
30.0000 mg | Freq: Once | INTRAMUSCULAR | Status: AC
  Administered 2019-04-23: 10:00:00 30 mg via INTRAVENOUS

## 2019-04-23 MED ORDER — GLYCOPYRROLATE 0.2 MG/ML IJ SOLN
INTRAMUSCULAR | Status: AC
  Administered 2019-04-23: 10:00:00
  Filled 2019-04-23: qty 1

## 2019-04-23 MED ORDER — SODIUM CHLORIDE 0.9 % IV SOLN
INTRAVENOUS | Status: DC | PRN
  Administered 2019-04-23: 11:00:00 via INTRAVENOUS

## 2019-04-23 MED ORDER — LABETALOL HCL 5 MG/ML IV SOLN
INTRAVENOUS | Status: DC | PRN
  Administered 2019-04-23: 30 mg via INTRAVENOUS

## 2019-04-23 MED ORDER — SUCCINYLCHOLINE CHLORIDE 20 MG/ML IJ SOLN
INTRAMUSCULAR | Status: AC
  Filled 2019-04-23: qty 1

## 2019-04-23 MED ORDER — METHOHEXITAL SODIUM 100 MG/10ML IV SOSY
PREFILLED_SYRINGE | INTRAVENOUS | Status: DC | PRN
  Administered 2019-04-23: 70 mg via INTRAVENOUS

## 2019-04-23 MED ORDER — LABETALOL HCL 5 MG/ML IV SOLN
INTRAVENOUS | Status: AC
  Filled 2019-04-23: qty 8

## 2019-04-23 MED ORDER — ESMOLOL HCL 100 MG/10ML IV SOLN
INTRAVENOUS | Status: AC
  Filled 2019-04-23: qty 10

## 2019-04-23 MED ORDER — GLYCOPYRROLATE 0.2 MG/ML IJ SOLN
INTRAMUSCULAR | Status: AC
  Filled 2019-04-23: qty 1

## 2019-04-23 MED ORDER — KETOROLAC TROMETHAMINE 30 MG/ML IJ SOLN
INTRAMUSCULAR | Status: AC
  Administered 2019-04-23: 30 mg via INTRAVENOUS
  Filled 2019-04-23: qty 1

## 2019-04-23 MED ORDER — SODIUM CHLORIDE 0.9 % IV SOLN
500.0000 mL | Freq: Once | INTRAVENOUS | Status: AC
  Administered 2019-04-23: 500 mL via INTRAVENOUS

## 2019-04-23 NOTE — Anesthesia Post-op Follow-up Note (Signed)
Anesthesia QCDR form completed.        

## 2019-04-23 NOTE — Procedures (Signed)
ECT SERVICES Physician's Interval Evaluation & Treatment Note  Patient Identification: Melinda [name] MRN:  [id number] Date of Evaluation:  04/23/2019 TX #: [id number]  MADRS:   MMSE:   P.E. Findings:  No change physical exam  Psychiatric Interval Note:  Stable chronic dysphoria  Subjective:  Patient is a 48 y.o. female seen for evaluation for Electroconvulsive Therapy. No specific worsening no self injury  Treatment Summary:   []   Right Unilateral             [x]  Bilateral   % Energy : 1.0 ms 35%   Impedance: 2280 ohms  Seizure Energy Index: 8605 V squared  Postictal Suppression Index: 51%  Seizure Concordance Index: 96%  Medications  Pre Shock: Robinul 0.4 mg Toradol 30 mg esmolol 20 mg Brevital 70 mg +30 mg today for 100 mg total labetalol 20 mg succinylcholine 100 mg  Post Shock:    Seizure Duration: 32 seconds by EMG 79 seconds EEG   Comments: Follow-up 2 weeks no change to regular medicine plan  Lungs:  [x]   Clear to auscultation               []  Other:   Heart:    [x]   Regular rhythm             []  irregular rhythm    [x]   Previous H&P reviewed, patient examined and there are NO CHANGES                 []   Previous H&P reviewed, patient examined and there are changes noted.   [name], MD 6/12/202010:46 AM

## 2019-04-23 NOTE — H&P (Signed)
Melinda [name] is an 48 y.o. female.   Chief Complaint: Chronic depression HPI: History of severe recurrent depression with partial response to ECT  Past Medical History:  Diagnosis Date  . Depression   . Diabetes mellitus without complication ([place])   . Diabetic peripheral neuropathy ([place]) 03/07/15  . Diabetic peripheral neuropathy ([place]) 03/07/15  . Diabetic peripheral neuropathy ([employer]) 03/07/15  . GERD (gastroesophageal reflux disease)   . Hypercholesterolemia 03/07/15  . Hypertension   . Obesity 03/07/15  . Personality disorder ([place]) 03/07/15  . Sinus tachycardia 03/07/15   history of  . Suicidal thoughts 03/07/15    Past Surgical History:  Procedure Laterality Date  . COLONOSCOPY WITH PROPOFOL N/A 12/01/2018   Procedure: COLONOSCOPY WITH PROPOFOL;  Surgeon: Jonathon Bellows, MD;  Location: [facility] ENDOSCOPY;  Service: Gastroenterology;  Laterality: N/A;  . electroconvulsion therapy  03/07/15    Family History  Problem Relation Age of Onset  . Hypertension Father   . Diabetes Mother    Social History:  reports that she has never smoked. She has never used smokeless tobacco. She reports that she does not drink alcohol or use drugs.  Allergies:  Allergies  Allergen Reactions  . Prednisone     Increases blood sugar    (Not in a hospital admission)   Results for orders placed or performed during the hospital encounter of 04/23/19 (from the past 48 hour(s))  Glucose, capillary     Status: Abnormal   Collection Time: 04/23/19  9:12 AM  Result Value Ref Range   Glucose-Capillary 125 (H) 70 - 99 mg/dL   No results found.  Review of Systems  Constitutional: Negative.   HENT: Negative.   Eyes: Negative.   Respiratory: Negative.   Cardiovascular: Negative.   Gastrointestinal: Negative.   Musculoskeletal: Negative.   Skin: Negative.   Neurological: Negative.   Psychiatric/Behavioral: Negative.     Blood pressure (!) 158/83, pulse (!) 102, temperature 98.7 F (37.1 C), resp.  rate 18, SpO2 100 %. Physical Exam  Nursing note and vitals reviewed. Constitutional: She appears well-developed and well-nourished.  HENT:  Head: Normocephalic and atraumatic.  Eyes: Pupils are equal, round, and reactive to light. Conjunctivae are normal.  Neck: Normal range of motion.  Cardiovascular: Normal heart sounds.  Respiratory: Effort normal.  GI: Soft.  Musculoskeletal: Normal range of motion.  Neurological: She is alert.  Skin: Skin is warm and dry.  Psychiatric: She has a normal mood and affect. Her behavior is normal. Judgment and thought content normal.     Assessment/Plan Continue maintenance treatment every 2 weeks  [name], MD 04/23/2019, 10:44 AM

## 2019-04-23 NOTE — Transfer of Care (Signed)
Immediate Anesthesia Transfer of Care Note  Patient: Melinda [name]  Procedure(s) Performed: ECT TX  Patient Location: PACU  Anesthesia Type:General  Level of Consciousness: drowsy and patient cooperative  Airway & Oxygen Therapy: Patient Spontanous Breathing and Patient connected to face mask oxygen  Post-op Assessment: Report given to RN and Post -op Vital signs reviewed and stable  Post vital signs: Reviewed and stable  Last Vitals:  Vitals Value Taken Time  BP 112/91 04/23/19 1105  Temp 37.1 C 04/23/19 1104  Pulse 102 04/23/19 1105  Resp 22 04/23/19 1104  SpO2 99 % 04/23/19 1105    Last Pain:  Vitals:   04/23/19 0915  PainSc: 0-No pain         Complications: No apparent anesthesia complications

## 2019-04-23 NOTE — Anesthesia Postprocedure Evaluation (Signed)
Anesthesia Post Note  Patient: Melinda [name]  Procedure(s) Performed: ECT TX  Patient location during evaluation: PACU Anesthesia Type: General Level of consciousness: awake and alert Pain management: pain level controlled Vital Signs Assessment: post-procedure vital signs reviewed and stable Respiratory status: spontaneous breathing, nonlabored ventilation and patient connected to nasal cannula oxygen Cardiovascular status: blood pressure returned to baseline and stable Postop Assessment: no apparent nausea or vomiting Anesthetic complications: no     Last Vitals:  Vitals:   04/23/19 1124 04/23/19 1135  BP: (!) 139/100 (!) 146/95  Pulse: 95 89  Resp: 20 18  Temp: 36.8 C   SpO2: 94%     Last Pain:  Vitals:   04/23/19 1124  PainSc: 0-No pain                 Durenda Hurt

## 2019-04-23 NOTE — Anesthesia Preprocedure Evaluation (Addendum)
Anesthesia Evaluation  Patient identified by MRN, date of birth, ID band Patient awake    Reviewed: Allergy & Precautions, H&P , NPO status , reviewed documented beta blocker date and time   History of Anesthesia Complications Negative for: history of anesthetic complications  Airway Mallampati: III  TM Distance: >3 FB Neck ROM: limited    Dental  (+) Chipped   Pulmonary neg shortness of breath, sleep apnea , neg recent URI,    Pulmonary exam normal        Cardiovascular hypertension, (-) anginaNormal cardiovascular exam  1st degree AVB on EKG   Neuro/Psych PSYCHIATRIC DISORDERS Depression Bipolar Disorder  Neuromuscular disease    GI/Hepatic GERD  Medicated and Controlled,  Endo/Other  diabetesMorbid obesity  Renal/GU      Musculoskeletal   Abdominal   Peds  Hematology   Anesthesia Other Findings Past Medical History: No date: Depression No date: Diabetes mellitus without complication ([place]) [birth year]: Diabetic peripheral neuropathy ([place]) 03/07/15: Diabetic peripheral neuropathy ([place]) 03/07/15: Diabetic peripheral neuropathy ([facility]) No date: GERD (gastroesophageal reflux disease) 03/07/15: Hypercholesterolemia No date: Hypertension 03/07/15: Obesity 03/07/15: Personality disorder ([place]) 03/07/15: Sinus tachycardia     Comment:  history of 03/07/15: Suicidal thoughts  Past Surgical History: 12/01/2018: COLONOSCOPY WITH PROPOFOL; N/A     Comment:  Procedure: COLONOSCOPY WITH PROPOFOL;  Surgeon: Jonathon Bellows, MD;  Location: [facility] ENDOSCOPY;  Service:               Gastroenterology;  Laterality: N/A; 03/07/15: electroconvulsion therapy  BMI    Body Mass Index:  41.32 kg/m      Reproductive/Obstetrics                            Anesthesia Physical  Anesthesia Plan  ASA: III  Anesthesia Plan: General   Post-op Pain Management:    Induction: Intravenous  PONV Risk Score and  Plan: Treatment may vary due to age or medical condition and TIVA  Airway Management Planned: Natural Airway and Mask  Additional Equipment:   Intra-op Plan:   Post-operative Plan:   Informed Consent: I have reviewed the patients History and Physical, chart, labs and discussed the procedure including the risks, benefits and alternatives for the proposed anesthesia with the patient or authorized representative who has indicated his/her understanding and acceptance.     Dental Advisory Given  Plan Discussed with: CRNA  Anesthesia Plan Comments:         Anesthesia Quick Evaluation

## 2019-04-23 NOTE — Discharge Instructions (Signed)
1)  The drugs that you have been given will stay in your system until tomorrow so for the       next 24 hours you should not:  A. Drive an automobile  B. Make any legal decisions  C. Drink any alcoholic beverages  2)  You may resume your regular meals upon return home.  3)  A responsible adult must take you home.  Someone should stay with you for a few          hours, then be available by phone for the remainder of the treatment day.  4)  You May experience any of the following symptoms:  Headache, Nausea and a dry mouth (due to the medications you were given),  temporary memory loss and some confusion, or sore muscles (a warm bath  should help this).  If you you experience any of these symptoms let us know on                your return visit.  5)  Report any of the following: any acute discomfort, severe headache, or temperature        greater than 100.5 F.   Also report any unusual redness, swelling, drainage, or pain         at your IV site.    You may report Symptoms to:  [place] at [facility]          Phone: [phone], ECT Department           or Dr. Prescott Gum office [phone]  6)  Your next ECT Treatment is Friday June 26 at 8:30   We will call 2 days prior to your scheduled appointment for arrival times.  7)  Nothing to eat or drink after midnight the night before your procedure.  8)  Take Lisinopril   With a sip of water the morning of your procedure.  9)  Other Instructions: Call [phone] to cancel the morning of your procedure due         to illness or emergency.  10) We will call within 72 hours to assess how you are feeling.

## 2019-04-24 ENCOUNTER — Other Ambulatory Visit: Payer: Self-pay | Admitting: Psychiatry

## 2019-04-26 ENCOUNTER — Other Ambulatory Visit: Payer: Self-pay

## 2019-04-26 NOTE — Progress Notes (Signed)
This encounter was created in error - please disregard.

## 2019-05-05 ENCOUNTER — Telehealth: Payer: Self-pay | Admitting: *Deleted

## 2019-05-06 ENCOUNTER — Other Ambulatory Visit: Payer: Self-pay | Admitting: Psychiatry

## 2019-05-07 ENCOUNTER — Encounter: Payer: Self-pay | Admitting: Anesthesiology

## 2019-05-07 ENCOUNTER — Other Ambulatory Visit: Payer: Self-pay

## 2019-05-07 ENCOUNTER — Encounter (HOSPITAL_BASED_OUTPATIENT_CLINIC_OR_DEPARTMENT_OTHER)
Admission: RE | Admit: 2019-05-07 | Discharge: 2019-05-07 | Disposition: A | Discharge status: Home / Self Care | Payer: Medicare PPO | Source: Ambulatory Visit | Attending: Psychiatry | Admitting: Psychiatry

## 2019-05-07 ENCOUNTER — Other Ambulatory Visit
Admission: RE | Admit: 2019-05-07 | Discharge: 2019-05-07 | Disposition: A | Discharge status: Home / Self Care | Payer: Medicare PPO | Source: Ambulatory Visit | Attending: Psychiatry | Admitting: Psychiatry

## 2019-05-07 ENCOUNTER — Ambulatory Visit: Payer: Self-pay | Admitting: Anesthesiology

## 2019-05-07 DIAGNOSIS — G4730 Sleep apnea, unspecified: Secondary | ICD-10-CM | POA: Diagnosis not present

## 2019-05-07 DIAGNOSIS — Z1159 Encounter for screening for other viral diseases: Secondary | ICD-10-CM | POA: Diagnosis not present

## 2019-05-07 DIAGNOSIS — F329 Major depressive disorder, single episode, unspecified: Secondary | ICD-10-CM | POA: Diagnosis not present

## 2019-05-07 DIAGNOSIS — E1140 Type 2 diabetes mellitus with diabetic neuropathy, unspecified: Secondary | ICD-10-CM | POA: Diagnosis not present

## 2019-05-07 DIAGNOSIS — F339 Major depressive disorder, recurrent, unspecified: Secondary | ICD-10-CM | POA: Diagnosis not present

## 2019-05-07 DIAGNOSIS — Z888 Allergy status to other drugs, medicaments and biological substances status: Secondary | ICD-10-CM | POA: Diagnosis not present

## 2019-05-07 DIAGNOSIS — E1142 Type 2 diabetes mellitus with diabetic polyneuropathy: Secondary | ICD-10-CM | POA: Diagnosis not present

## 2019-05-07 DIAGNOSIS — F332 Major depressive disorder, recurrent severe without psychotic features: Secondary | ICD-10-CM | POA: Diagnosis not present

## 2019-05-07 DIAGNOSIS — I10 Essential (primary) hypertension: Secondary | ICD-10-CM | POA: Diagnosis not present

## 2019-05-07 DIAGNOSIS — K219 Gastro-esophageal reflux disease without esophagitis: Secondary | ICD-10-CM | POA: Diagnosis not present

## 2019-05-07 LAB — GLUCOSE, CAPILLARY
Glucose-Capillary: 127 mg/dL — ABNORMAL HIGH (ref 70–99)
Glucose-Capillary: 141 mg/dL — ABNORMAL HIGH (ref 70–99)

## 2019-05-07 LAB — SARS CORONAVIRUS 2 BY RT PCR (HOSPITAL ORDER, PERFORMED IN ~~LOC~~ HOSPITAL LAB): SARS Coronavirus 2: NEGATIVE

## 2019-05-07 MED ORDER — LABETALOL HCL 5 MG/ML IV SOLN
INTRAVENOUS | Status: DC | PRN
  Administered 2019-05-07: 30 mg via INTRAVENOUS

## 2019-05-07 MED ORDER — ESMOLOL HCL 100 MG/10ML IV SOLN
INTRAVENOUS | Status: DC | PRN
  Administered 2019-05-07: 20 mg via INTRAVENOUS

## 2019-05-07 MED ORDER — GLYCOPYRROLATE 0.2 MG/ML IJ SOLN
INTRAMUSCULAR | Status: AC
  Administered 2019-05-07: 0.4 mg via INTRAVENOUS
  Filled 2019-05-07: qty 1

## 2019-05-07 MED ORDER — GLYCOPYRROLATE 0.2 MG/ML IJ SOLN
INTRAMUSCULAR | Status: AC
  Filled 2019-05-07: qty 1

## 2019-05-07 MED ORDER — SODIUM CHLORIDE 0.9 % IV SOLN: 500.0000 mL | Freq: Once | INTRAVENOUS | Status: DC

## 2019-05-07 MED ORDER — KETOROLAC TROMETHAMINE 30 MG/ML IJ SOLN
30.0000 mg | Freq: Once | INTRAMUSCULAR | Status: AC
  Administered 2019-05-07: 09:00:00 30 mg via INTRAVENOUS

## 2019-05-07 MED ORDER — ONDANSETRON HCL 4 MG/2ML IJ SOLN: 4.0000 mg | Freq: Once | INTRAMUSCULAR | Status: DC | PRN

## 2019-05-07 MED ORDER — METHOHEXITAL SODIUM 100 MG/10ML IV SOSY
PREFILLED_SYRINGE | INTRAVENOUS | Status: DC | PRN
  Administered 2019-05-07: 70 mg via INTRAVENOUS

## 2019-05-07 MED ORDER — GLYCOPYRROLATE 0.2 MG/ML IJ SOLN
0.4000 mg | Freq: Once | INTRAMUSCULAR | Status: AC
  Administered 2019-05-07: 0.4 mg via INTRAVENOUS

## 2019-05-07 MED ORDER — KETOROLAC TROMETHAMINE 30 MG/ML IJ SOLN
INTRAMUSCULAR | Status: AC
  Administered 2019-05-07: 30 mg via INTRAVENOUS
  Filled 2019-05-07: qty 1

## 2019-05-07 MED ORDER — SUCCINYLCHOLINE CHLORIDE 20 MG/ML IJ SOLN
INTRAMUSCULAR | Status: DC | PRN
  Administered 2019-05-07: 100 mg via INTRAVENOUS

## 2019-05-07 MED ORDER — FENTANYL CITRATE (PF) 100 MCG/2ML IJ SOLN: 25.0000 ug | INTRAMUSCULAR | Status: DC | PRN

## 2019-05-07 NOTE — Transfer of Care (Signed)
Immediate Anesthesia Transfer of Care Note  Patient: Melinda [name]  Procedure(s) Performed: ECT TX  Patient Location: PACU  Anesthesia Type:General  Level of Consciousness: awake  Airway & Oxygen Therapy: Patient Spontanous Breathing  Post-op Assessment: Report given to RN  Post vital signs: stable  Last Vitals:  Vitals Value Taken Time  BP    Temp    Pulse    Resp    SpO2      Last Pain:  Vitals:   05/07/19 0825  TempSrc:   PainSc: 0-No pain         Complications: No apparent anesthesia complications

## 2019-05-07 NOTE — Anesthesia Preprocedure Evaluation (Signed)
Anesthesia Evaluation  Patient identified by MRN, date of birth, ID band Patient awake    Reviewed: Allergy & Precautions, NPO status , Patient's Chart, lab work & pertinent test results, reviewed documented beta blocker date and time   Airway Mallampati: III  TM Distance: >3 FB     Dental  (+) Chipped   Pulmonary sleep apnea ,           Cardiovascular hypertension,      Neuro/Psych PSYCHIATRIC DISORDERS Depression Bipolar Disorder  Neuromuscular disease    GI/Hepatic GERD  Controlled,  Endo/Other  diabetes, Type 2  Renal/GU      Musculoskeletal   Abdominal   Peds  Hematology   Anesthesia Other Findings   Reproductive/Obstetrics                             Anesthesia Physical Anesthesia Plan  ASA: III  Anesthesia Plan: General   Post-op Pain Management:    Induction: Intravenous  PONV Risk Score and Plan:   Airway Management Planned:   Additional Equipment:   Intra-op Plan:   Post-operative Plan:   Informed Consent: I have reviewed the patients History and Physical, chart, labs and discussed the procedure including the risks, benefits and alternatives for the proposed anesthesia with the patient or authorized representative who has indicated his/her understanding and acceptance.       Plan Discussed with: CRNA  Anesthesia Plan Comments:         Anesthesia Quick Evaluation

## 2019-05-07 NOTE — Discharge Instructions (Signed)
1)  The drugs that you have been given will stay in your system until tomorrow so for the       next 24 hours you should not:  A. Drive an automobile  B. Make any legal decisions  C. Drink any alcoholic beverages  2)  You may resume your regular meals upon return home.  3)  A responsible adult must take you home.  Someone should stay with you for a few          hours, then be available by phone for the remainder of the treatment day.  4)  You May experience any of the following symptoms:  Headache, Nausea and a dry mouth (due to the medications you were given),  temporary memory loss and some confusion, or sore muscles (a warm bath  should help this).  If you you experience any of these symptoms let us know on                your return visit.  5)  Report any of the following: any acute discomfort, severe headache, or temperature        greater than 100.5 F.   Also report any unusual redness, swelling, drainage, or pain         at your IV site.    You may report Symptoms to:  [place] at [facility]          Phone: [phone], ECT Department           or Dr. Prescott Gum office [phone]  6)  Your next ECT Treatment is Friday July 10   We will call 2 days prior to your scheduled appointment for arrival times.  7)  Nothing to eat or drink after midnight the night before your procedure.  8)  Take     With a sip of water the morning of your procedure.  9)  Other Instructions: Call [phone] to cancel the morning of your procedure due         to illness or emergency.  10) We will call within 72 hours to assess how you are feeling.

## 2019-05-07 NOTE — Anesthesia Postprocedure Evaluation (Signed)
Anesthesia Post Note  Patient: Melinda [name]  Procedure(s) Performed: ECT TX  Patient location during evaluation: PACU Anesthesia Type: General Level of consciousness: awake and alert Pain management: pain level controlled Vital Signs Assessment: post-procedure vital signs reviewed and stable Respiratory status: spontaneous breathing, nonlabored ventilation, respiratory function stable and patient connected to nasal cannula oxygen Cardiovascular status: blood pressure returned to baseline and stable Postop Assessment: no apparent nausea or vomiting Anesthetic complications: no     Last Vitals:  Vitals:   05/07/19 1148 05/07/19 1157  BP: 139/67 138/80  Pulse: 87 87  Resp: 20 18  Temp: 36.6 C   SpO2: 93%     Last Pain:  Vitals:   05/07/19 1157  TempSrc:   PainSc: 0-No pain                 Mabel Roll S

## 2019-05-07 NOTE — H&P (Signed)
Melinda [name] is an 48 y.o. female.   Chief Complaint: Chronic depression and anxiety no suicidal ideation HPI: History of recurrent depression and stabilized with maintenance ECT  Past Medical History:  Diagnosis Date  . Depression   . Diabetes mellitus without complication ([place])   . Diabetic peripheral neuropathy ([place]) 03/07/15  . Diabetic peripheral neuropathy ([place]) 03/07/15  . Diabetic peripheral neuropathy ([place]) 03/07/15  . GERD (gastroesophageal reflux disease)   . Hypercholesterolemia 03/07/15  . Hypertension   . Obesity 03/07/15  . Personality disorder ([place]) 03/07/15  . Sinus tachycardia 03/07/15   history of  . Suicidal thoughts 03/07/15    Past Surgical History:  Procedure Laterality Date  . COLONOSCOPY WITH PROPOFOL N/A 12/01/2018   Procedure: COLONOSCOPY WITH PROPOFOL;  Surgeon: Jonathon Bellows, MD;  Location: [facility] ENDOSCOPY;  Service: Gastroenterology;  Laterality: N/A;  . electroconvulsion therapy  03/07/15    Family History  Problem Relation Age of Onset  . Hypertension Father   . Diabetes Mother    Social History:  reports that she has never smoked. She has never used smokeless tobacco. She reports that she does not drink alcohol or use drugs.  Allergies:  Allergies  Allergen Reactions  . Prednisone     Increases blood sugar    (Not in a hospital admission)   Results for orders placed or performed during the hospital encounter of 05/07/19 (from the past 48 hour(s))  Glucose, capillary     Status: Abnormal   Collection Time: 05/07/19  8:44 AM  Result Value Ref Range   Glucose-Capillary 127 (H) 70 - 99 mg/dL   No results found.  Review of Systems  Constitutional: Negative.   HENT: Negative.   Eyes: Negative.   Respiratory: Negative.   Cardiovascular: Negative.   Gastrointestinal: Negative.   Musculoskeletal: Negative.   Skin: Negative.   Neurological: Negative.   Psychiatric/Behavioral: Negative.     Blood pressure (!) 159/94, temperature 98.9 F  (37.2 C), temperature source Oral, resp. rate 18, height 5\' 4"  (1.626 m), weight 115.7 kg, SpO2 97 %. Physical Exam  Nursing note and vitals reviewed. Constitutional: She appears well-developed and well-nourished.  HENT:  Head: Normocephalic and atraumatic.  Eyes: Pupils are equal, round, and reactive to light. Conjunctivae are normal.  Neck: Normal range of motion.  Cardiovascular: Regular rhythm and normal heart sounds.  Respiratory: Effort normal. No respiratory distress.  GI: Soft.  Musculoskeletal: Normal range of motion.  Neurological: She is alert.  Skin: Skin is warm and dry.  Psychiatric: She has a normal mood and affect. Her behavior is normal. Judgment and thought content normal.     Assessment/Plan Next treatment 2 weeks as usual with her schedule  [name], MD 05/07/2019, 10:59 AM

## 2019-05-07 NOTE — Anesthesia Post-op Follow-up Note (Signed)
Anesthesia QCDR form completed.        

## 2019-05-07 NOTE — Procedures (Signed)
ECT SERVICES Physician's Interval Evaluation & Treatment Note  Patient Identification: Melinda [name] MRN:  [id number] Date of Evaluation:  05/07/2019 TX #: [id number]  MADRS:   MMSE:   P.E. Findings:  No change physical exam  Psychiatric Interval Note:  Chronic dysphoria no better or worse than usual  Subjective:  Patient is a 48 y.o. female seen for evaluation for Electroconvulsive Therapy. No specific complaints  Treatment Summary:   []   Right Unilateral             [x]  Bilateral   % Energy : 1.0 ms 35%   Impedance: 560 ohms  Seizure Energy Index: 7951 V squared  Postictal Suppression Index: 80%  Seizure Concordance Index: 92%  Medications  Pre Shock: Robinul 0.4 mg Toradol 30 mg labetalol 30 mg esmolol 20 mg Brevital 70 mg succinylcholine 100 mg  Post Shock:    Seizure Duration: 37 seconds by movement 79 seconds by EEG   Comments: Follow-up 2 weeks by usual schedule  Lungs:  [x]   Clear to auscultation               []  Other:   Heart:    [x]   Regular rhythm             []  irregular rhythm    [x]   Previous H&P reviewed, patient examined and there are NO CHANGES                 []   Previous H&P reviewed, patient examined and there are changes noted.   [name], MD 6/26/202011:01 AM

## 2019-05-13 ENCOUNTER — Other Ambulatory Visit: Payer: Self-pay

## 2019-05-13 ENCOUNTER — Ambulatory Visit (INDEPENDENT_AMBULATORY_CARE_PROVIDER_SITE_OTHER): Payer: Medicare PPO | Admitting: Physician Assistant

## 2019-05-13 ENCOUNTER — Telehealth: Payer: Self-pay

## 2019-05-13 ENCOUNTER — Encounter: Payer: Self-pay | Admitting: Physician Assistant

## 2019-05-13 VITALS — BP 155/90 | HR 115 | Temp 98.7°F | Resp 16 | Ht 63.0 in | Wt 253.4 lb

## 2019-05-13 DIAGNOSIS — B373 Candidiasis of vulva and vagina: Secondary | ICD-10-CM | POA: Diagnosis not present

## 2019-05-13 DIAGNOSIS — I429 Cardiomyopathy, unspecified: Secondary | ICD-10-CM | POA: Diagnosis not present

## 2019-05-13 DIAGNOSIS — E1142 Type 2 diabetes mellitus with diabetic polyneuropathy: Secondary | ICD-10-CM | POA: Diagnosis not present

## 2019-05-13 DIAGNOSIS — B3731 Acute candidiasis of vulva and vagina: Secondary | ICD-10-CM

## 2019-05-13 DIAGNOSIS — E7800 Pure hypercholesterolemia, unspecified: Secondary | ICD-10-CM | POA: Diagnosis not present

## 2019-05-13 DIAGNOSIS — B379 Candidiasis, unspecified: Secondary | ICD-10-CM

## 2019-05-13 DIAGNOSIS — I10 Essential (primary) hypertension: Secondary | ICD-10-CM | POA: Diagnosis not present

## 2019-05-13 LAB — POCT GLYCOSYLATED HEMOGLOBIN (HGB A1C)
Est. average glucose Bld gHb Est-mCnc: 154
Hemoglobin A1C: 7 % — AB (ref 4.0–5.6)

## 2019-05-13 MED ORDER — ACCU-CHEK AVIVA PLUS VI STRP: ORAL_STRIP | 12 refills | Status: DC

## 2019-05-13 MED ORDER — TERCONAZOLE 0.4 % VA CREA: 1.0000 | TOPICAL_CREAM | Freq: Every day | VAGINAL | 0 refills | Status: DC

## 2019-05-13 NOTE — Patient Instructions (Signed)
Diabetes Mellitus and Exercise Exercising regularly is important for your overall health, especially when you have diabetes (diabetes mellitus). Exercising is not only about losing weight. It has many other health benefits, such as increasing muscle strength and bone density and reducing body fat and stress. This leads to improved fitness, flexibility, and endurance, all of which result in better overall health. Exercise has additional benefits for people with diabetes, including:  Reducing appetite.  Helping to lower and control blood glucose.  Lowering blood pressure.  Helping to control amounts of fatty substances (lipids) in the blood, such as cholesterol and triglycerides.  Helping the body to respond better to insulin (improving insulin sensitivity).  Reducing how much insulin the body needs.  Decreasing the risk for heart disease by: ? Lowering cholesterol and triglyceride levels. ? Increasing the levels of good cholesterol. ? Lowering blood glucose levels. What is my activity plan? Your health care provider or certified diabetes educator can help you make a plan for the type and frequency of exercise (activity plan) that works for you. Make sure that you:  Do at least 150 minutes of moderate-intensity or vigorous-intensity exercise each week. This could be brisk walking, biking, or water aerobics. ? Do stretching and strength exercises, such as yoga or weightlifting, at least 2 times a week. ? Spread out your activity over at least 3 days of the week.  Get some form of physical activity every day. ? Do not go more than 2 days in a row without some kind of physical activity. ? Avoid being inactive for more than 30 minutes at a time. Take frequent breaks to walk or stretch.  Choose a type of exercise or activity that you enjoy, and set realistic goals.  Start slowly, and gradually increase the intensity of your exercise over time. What do I need to know about managing my  diabetes?   Check your blood glucose before and after exercising. ? If your blood glucose is 240 mg/dL (13.3 mmol/L) or higher before you exercise, check your urine for ketones. If you have ketones in your urine, do not exercise until your blood glucose returns to normal. ? If your blood glucose is 100 mg/dL (5.6 mmol/L) or lower, eat a snack containing 15-20 grams of carbohydrate. Check your blood glucose 15 minutes after the snack to make sure that your level is above 100 mg/dL (5.6 mmol/L) before you start your exercise.  Know the symptoms of low blood glucose (hypoglycemia) and how to treat it. Your risk for hypoglycemia increases during and after exercise. Common symptoms of hypoglycemia can include: ? Hunger. ? Anxiety. ? Sweating and feeling clammy. ? Confusion. ? Dizziness or feeling light-headed. ? Increased heart rate or palpitations. ? Blurry vision. ? Tingling or numbness around the mouth, lips, or tongue. ? Tremors or shakes. ? Irritability.  Keep a rapid-acting carbohydrate snack available before, during, and after exercise to help prevent or treat hypoglycemia.  Avoid injecting insulin into areas of the body that are going to be exercised. For example, avoid injecting insulin into: ? The arms, when playing tennis. ? The legs, when jogging.  Keep records of your exercise habits. Doing this can help you and your health care provider adjust your diabetes management plan as needed. Write down: ? Food that you eat before and after you exercise. ? Blood glucose levels before and after you exercise. ? The type and amount of exercise you have done. ? When your insulin is expected to peak, if you use   insulin. Avoid exercising at times when your insulin is peaking.  When you start a new exercise or activity, work with your health care provider to make sure the activity is safe for you, and to adjust your insulin, medicines, or food intake as needed.  Drink plenty of water while  you exercise to prevent dehydration or heat stroke. Drink enough fluid to keep your urine clear or pale yellow. Summary  Exercising regularly is important for your overall health, especially when you have diabetes (diabetes mellitus).  Exercising has many health benefits, such as increasing muscle strength and bone density and reducing body fat and stress.  Your health care provider or certified diabetes educator can help you make a plan for the type and frequency of exercise (activity plan) that works for you.  When you start a new exercise or activity, work with your health care provider to make sure the activity is safe for you, and to adjust your insulin, medicines, or food intake as needed. This information is not intended to replace advice given to you by your health care provider. Make sure you discuss any questions you have with your health care provider. Document Released: 01/18/2004 Document Revised: 05/22/2017 Document Reviewed: 04/08/2016 Elsevier Patient Education  2020 [employer]  

## 2019-05-13 NOTE — Progress Notes (Signed)
Patient: Melinda [name] Female    DOB: 1971-01-28   48 y.o.   MRN: [id number] Visit Date: 05/13/2019  Today's Provider: Trinna Post, PA-C   Chief Complaint  Patient presents with  . Diabetes   Subjective:     HPI   Son aged 73 moved out into his own place, causing some stress for her.    Diabetes Mellitus Type II, Follow-up:   Lab Results  Component Value Date   HGBA1C 7.0 (A) 05/13/2019   HGBA1C 7.1 (A) 02/11/2019   HGBA1C 6.9 (A) 10/06/2018    Last seen for diabetes 3 months ago.  Management since then includes changing from pioglitazone to metformin. She is currently taking metformin XR 500 mg BID without issues.  She reports excellent compliance with treatment. She is not having side effects.  Current symptoms include none and have been stable. Home blood sugar records: fasting range: 120's  Episodes of hypoglycemia? no   Current insulin regiment: none Most Recent Eye Exam: UTD AEC Weight trend: stable Prior visit with dietician: No Current exercise: none Current diet habits: in general, a "healthy" diet    Pertinent Labs:    Component Value Date/Time   CHOL 160 10/06/2018 1035   TRIG 96 10/06/2018 1035   HDL 54 10/06/2018 1035   LDLCALC 87 10/06/2018 1035   CREATININE 0.78 11/12/2018 1013   CREATININE 0.77 05/08/2013 1404    Wt Readings from Last 3 Encounters:  05/13/19 253 lb 6.4 oz (114.9 kg)  03/02/19 263 lb 12.8 oz (119.7 kg)  02/11/19 254 lb 3.2 oz (115.3 kg)   HTN:  Hasn't taken medication today, taking lisinopril 10 mg daily.   BP Readings from Last 3 Encounters:  05/13/19 (!) 155/90  03/02/19 126/72  02/11/19 116/81   HLD: currently taking Lipitor 10 mg daily.   Lipid Panel     Component Value Date/Time   CHOL 160 10/06/2018 1035   TRIG 96 10/06/2018 1035   HDL 54 10/06/2018 1035   CHOLHDL 3.0 10/06/2018 1035   LDLCALC 87 10/06/2018 1035    ------------------------------------------------------------------------   Allergies  Allergen Reactions  . Prednisone     Increases blood sugar     Current Outpatient Medications:  .  amoxicillin (AMOXIL) 500 MG capsule, Take 500 mg by mouth 3 (three) times daily. Take 1 capsule by mouth three times daily for 10 days., Disp: , Rfl:  .  atorvastatin (LIPITOR) 10 MG tablet, Take 1 tablet (10 mg total) by mouth at bedtime., Disp: 90 tablet, Rfl: 1 .  buPROPion (WELLBUTRIN XL) 300 MG 24 hr tablet, TAKE 1 TABLET EVERY DAY, Disp: 90 tablet, Rfl: 0 .  escitalopram (LEXAPRO) 20 MG tablet, TAKE 1 TABLET EVERY DAY, Disp: 90 tablet, Rfl: 0 .  Lancets Misc. (ACCU-CHEK FASTCLIX LANCET) KIT, , Disp: , Rfl:  .  lisinopril (ZESTRIL) 10 MG tablet, TAKE 1 TABLET (10 MG TOTAL) BY MOUTH DAILY., Disp: 90 tablet, Rfl: 0 .  meloxicam (MOBIC) 7.5 MG tablet, Take 1 tablet (7.5 mg total) by mouth daily., Disp: 30 tablet, Rfl: 0 .  metFORMIN (GLUCOPHAGE XR) 500 MG 24 hr tablet, Take 1 tablet (500 mg total) by mouth daily with breakfast for 14 days, THEN 1 tablet (500 mg total) 2 (two) times daily., Disp: 194 tablet, Rfl: 0 .  ziprasidone (GEODON) 80 MG capsule, TAKE 1 CAPSULE TWO TIMES DAILY WITH A MEAL., Disp: 180 capsule, Rfl: 0  Review of Systems  Constitutional: Negative.  Eyes: Negative.   Cardiovascular: Negative.   Endocrine: Negative.     Social History   Tobacco Use  . Smoking status: Never Smoker  . Smokeless tobacco: Never Used  Substance Use Topics  . Alcohol use: No      Objective:   BP (!) 155/90 (BP Location: Left Arm, Patient Position: Sitting, Cuff Size: Large)   Pulse (!) 115   Temp 98.7 F (37.1 C) (Oral)   Resp 16   Ht '5\' 3"'$  (1.6 m)   Wt 253 lb 6.4 oz (114.9 kg)   LMP  (LMP Unknown) Comment: pt states she has not had period in the past year  SpO2 98%   BMI 44.89 kg/m  Vitals:   05/13/19 0811  BP: (!) 155/90  Pulse: (!) 115  Resp: 16  Temp: 98.7 F (37.1 C)  TempSrc: Oral  SpO2: 98%  Weight: 253 lb 6.4 oz (114.9 kg)  Height: '5\' 3"'$  (1.6  m)     Physical Exam Constitutional:      Appearance: Normal appearance.  Cardiovascular:     Rate and Rhythm: Normal rate and regular rhythm.     Heart sounds: Normal heart sounds.  Pulmonary:     Effort: Pulmonary effort is normal.     Breath sounds: Normal breath sounds.  Skin:    General: Skin is warm and dry.  Neurological:     Mental Status: She is alert and oriented to person, place, and time. Mental status is at baseline.  Psychiatric:        Mood and Affect: Mood normal.        Behavior: Behavior normal.      Results for orders placed or performed in visit on 05/13/19  POCT glycosylated hemoglobin (Hb A1C)  Result Value Ref Range   Hemoglobin A1C 7.0 (A) 4.0 - 5.6 %   Est. average glucose Bld gHb Est-mCnc 154        Assessment & Plan    1. Type 2 diabetes mellitus with diabetic polyneuropathy, without long-term current use of insulin (HCC)  A1c is 7.0% today. Continues to eat pancakes, fried chicken, sausage biscuits, donuts, pasta and pizza. Used to get two dozen donuts per month, now only gets one dozen. Takes metformin 500 mg BID. Continue. May need to increase next visit if her A1c goes above 7.0%.  - POCT glycosylated hemoglobin (Hb A1C)  2. Hypercholesteremia  Continue Lipitor 10 mg daily.  3. Essential hypertension  Continue Lisinopril 10 mg daily.  The entirety of the information documented in the History of Present Illness, Review of Systems and Physical Exam were personally obtained by me. Portions of this information were initially documented by Lynford Humphrey, CMA and reviewed by me for thoroughness and accuracy.         Trinna Post, PA-C  [facility]

## 2019-05-13 NOTE — Telephone Encounter (Signed)
Pt needs test strips for Accu-chek Aviva sent to [facility].  Pt also states she has a yeast infection and would like diflucan sent to [facility] as well.    Contact number [phone]  Thanks,   -Mickel Baas

## 2019-05-13 NOTE — Telephone Encounter (Signed)
Pt called stating she uses Accu-chek Aviva Plus test strips.   Thanks,    -Mickel Baas

## 2019-05-17 ENCOUNTER — Telehealth: Payer: Self-pay

## 2019-05-17 NOTE — Telephone Encounter (Signed)
Strips have been sent.

## 2019-05-20 ENCOUNTER — Other Ambulatory Visit: Payer: Self-pay | Admitting: Psychiatry

## 2019-05-21 ENCOUNTER — Other Ambulatory Visit
Admission: RE | Admit: 2019-05-21 | Discharge: 2019-05-21 | Disposition: A | Discharge status: Home / Self Care | Payer: Medicare PPO | Source: Ambulatory Visit | Attending: Psychiatry | Admitting: Psychiatry

## 2019-05-21 ENCOUNTER — Other Ambulatory Visit: Payer: Self-pay

## 2019-05-21 ENCOUNTER — Encounter: Payer: Self-pay | Admitting: Anesthesiology

## 2019-05-21 ENCOUNTER — Encounter
Admission: RE | Admit: 2019-05-21 | Discharge: 2019-05-21 | Disposition: A | Discharge status: Home / Self Care | Payer: Medicare PPO | Source: Ambulatory Visit | Attending: Psychiatry | Admitting: Psychiatry

## 2019-05-21 DIAGNOSIS — Z79899 Other long term (current) drug therapy: Secondary | ICD-10-CM | POA: Insufficient documentation

## 2019-05-21 DIAGNOSIS — Z6841 Body Mass Index (BMI) 40.0 and over, adult: Secondary | ICD-10-CM | POA: Insufficient documentation

## 2019-05-21 DIAGNOSIS — F332 Major depressive disorder, recurrent severe without psychotic features: Secondary | ICD-10-CM | POA: Diagnosis not present

## 2019-05-21 DIAGNOSIS — Z888 Allergy status to other drugs, medicaments and biological substances status: Secondary | ICD-10-CM | POA: Insufficient documentation

## 2019-05-21 DIAGNOSIS — E1140 Type 2 diabetes mellitus with diabetic neuropathy, unspecified: Secondary | ICD-10-CM | POA: Diagnosis not present

## 2019-05-21 DIAGNOSIS — F329 Major depressive disorder, single episode, unspecified: Secondary | ICD-10-CM | POA: Insufficient documentation

## 2019-05-21 DIAGNOSIS — K219 Gastro-esophageal reflux disease without esophagitis: Secondary | ICD-10-CM | POA: Insufficient documentation

## 2019-05-21 DIAGNOSIS — E1142 Type 2 diabetes mellitus with diabetic polyneuropathy: Secondary | ICD-10-CM | POA: Insufficient documentation

## 2019-05-21 DIAGNOSIS — Z833 Family history of diabetes mellitus: Secondary | ICD-10-CM | POA: Diagnosis not present

## 2019-05-21 DIAGNOSIS — I10 Essential (primary) hypertension: Secondary | ICD-10-CM | POA: Diagnosis not present

## 2019-05-21 DIAGNOSIS — E7800 Pure hypercholesterolemia, unspecified: Secondary | ICD-10-CM | POA: Diagnosis not present

## 2019-05-21 DIAGNOSIS — E669 Obesity, unspecified: Secondary | ICD-10-CM | POA: Insufficient documentation

## 2019-05-21 DIAGNOSIS — Z8249 Family history of ischemic heart disease and other diseases of the circulatory system: Secondary | ICD-10-CM | POA: Diagnosis not present

## 2019-05-21 DIAGNOSIS — Z01812 Encounter for preprocedural laboratory examination: Secondary | ICD-10-CM | POA: Diagnosis not present

## 2019-05-21 DIAGNOSIS — F609 Personality disorder, unspecified: Secondary | ICD-10-CM | POA: Insufficient documentation

## 2019-05-21 DIAGNOSIS — G4730 Sleep apnea, unspecified: Secondary | ICD-10-CM | POA: Diagnosis not present

## 2019-05-21 DIAGNOSIS — Z1159 Encounter for screening for other viral diseases: Secondary | ICD-10-CM | POA: Diagnosis not present

## 2019-05-21 LAB — SARS CORONAVIRUS 2 BY RT PCR (HOSPITAL ORDER, PERFORMED IN ~~LOC~~ HOSPITAL LAB): SARS Coronavirus 2: NEGATIVE

## 2019-05-21 LAB — GLUCOSE, CAPILLARY: Glucose-Capillary: 115 mg/dL — ABNORMAL HIGH (ref 70–99)

## 2019-05-21 MED ORDER — SODIUM CHLORIDE 0.9 % IV SOLN
500.0000 mL | Freq: Once | INTRAVENOUS | Status: AC
  Administered 2019-05-21: 500 mL via INTRAVENOUS

## 2019-05-21 MED ORDER — SODIUM CHLORIDE 0.9 % IV SOLN
INTRAVENOUS | Status: DC | PRN
  Administered 2019-05-21: 11:00:00 via INTRAVENOUS

## 2019-05-21 MED ORDER — SUCCINYLCHOLINE CHLORIDE 20 MG/ML IJ SOLN
INTRAMUSCULAR | Status: AC
  Filled 2019-05-21: qty 1

## 2019-05-21 MED ORDER — GLYCOPYRROLATE 0.2 MG/ML IJ SOLN
INTRAMUSCULAR | Status: AC
  Filled 2019-05-21: qty 2

## 2019-05-21 MED ORDER — ESMOLOL HCL 100 MG/10ML IV SOLN
INTRAVENOUS | Status: DC | PRN
  Administered 2019-05-21: 20 mg via INTRAVENOUS

## 2019-05-21 MED ORDER — SUCCINYLCHOLINE CHLORIDE 200 MG/10ML IV SOSY
PREFILLED_SYRINGE | INTRAVENOUS | Status: DC | PRN
  Administered 2019-05-21: 100 mg via INTRAVENOUS

## 2019-05-21 MED ORDER — METHOHEXITAL SODIUM 100 MG/10ML IV SOSY
PREFILLED_SYRINGE | INTRAVENOUS | Status: DC | PRN
  Administered 2019-05-21: 70 mg via INTRAVENOUS

## 2019-05-21 MED ORDER — LABETALOL HCL 5 MG/ML IV SOLN
INTRAVENOUS | Status: AC
  Filled 2019-05-21: qty 8

## 2019-05-21 MED ORDER — LABETALOL HCL 5 MG/ML IV SOLN
INTRAVENOUS | Status: DC | PRN
  Administered 2019-05-21: 30 mg via INTRAVENOUS

## 2019-05-21 MED ORDER — GLYCOPYRROLATE 0.2 MG/ML IJ SOLN
0.4000 mg | Freq: Once | INTRAMUSCULAR | Status: AC
  Administered 2019-05-21: 0.4 mg via INTRAVENOUS

## 2019-05-21 NOTE — Discharge Instructions (Signed)
1)  The drugs that you have been given will stay in your system until tomorrow so for the       next 24 hours you should not:  A. Drive an automobile  B. Make any legal decisions  C. Drink any alcoholic beverages  2)  You may resume your regular meals upon return home.  3)  A responsible adult must take you home.  Someone should stay with you for a few          hours, then be available by phone for the remainder of the treatment day.  4)  You May experience any of the following symptoms:  Headache, Nausea and a dry mouth (due to the medications you were given),  temporary memory loss and some confusion, or sore muscles (a warm bath  should help this).  If you you experience any of these symptoms let us know on                your return visit.  5)  Report any of the following: any acute discomfort, severe headache, or temperature        greater than 100.5 F.   Also report any unusual redness, swelling, drainage, or pain         at your IV site.    You may report Symptoms to:  [place] at [facility]          Phone: [phone], ECT Department           or Dr. Prescott Gum office [phone]  6)  Your next ECT Treatment is July 31  We will call 2 days prior to your scheduled appointment for arrival times.  7)  Nothing to eat or drink after midnight the night before your procedure.  8)  Take Lisinopril  With a sip of water the morning of your procedure.  9)  Other Instructions: Call [phone] to cancel the morning of your procedure due         to illness or emergency.  10) We will call within 72 hours to assess how you are feeling.

## 2019-05-21 NOTE — Procedures (Signed)
ECT SERVICES Physician's Interval Evaluation & Treatment Note  Patient Identification: Melinda [name] MRN:  [id number] Date of Evaluation:  05/21/2019 TX #: [id number]  MADRS:   MMSE:   P.E. Findings:  No change to physical exam  Psychiatric Interval Note:  Mood feeling little more down not psychotic not suicidal  Subjective:  Patient is a 48 y.o. female seen for evaluation for Electroconvulsive Therapy. Says it is been a worse than average week.  Treatment Summary:   []   Right Unilateral             [x]  Bilateral   % Energy : 1.0 ms 35%   Impedance: 1130 ohms  Seizure Energy Index: 6743 V squared  Postictal Suppression Index: 67%  Seizure Concordance Index: 97%  Medications  Pre Shock: Robinul 0.4 mg Toradol 30 mg labetalol 20 mg esmolol 20 mg Brevital 70 mg succinylcholine 100 mg  Post Shock:    Seizure Duration: 35 seconds EMG 76 seconds EEG   Comments: Follow-up 3 weeks  Lungs:  [x]   Clear to auscultation               []  Other:   Heart:    [x]   Regular rhythm             []  irregular rhythm    [x]   Previous H&P reviewed, patient examined and there are NO CHANGES                 []   Previous H&P reviewed, patient examined and there are changes noted.   [name], MD 7/10/202011:06 AM

## 2019-05-21 NOTE — H&P (Signed)
Melinda [name] is an 48 y.o. female.   Chief Complaint: Patient with chronic depression feeling a little more down this week HPI: Chronic depression with partial improvement with ECT maintenance  Past Medical History:  Diagnosis Date  . Depression   . Diabetes mellitus without complication ([place])   . Diabetic peripheral neuropathy ([place]) 03/07/15  . Diabetic peripheral neuropathy ([place]) 03/07/15  . Diabetic peripheral neuropathy ([place]) 03/07/15  . GERD (gastroesophageal reflux disease)   . Hypercholesterolemia 03/07/15  . Hypertension   . Obesity 03/07/15  . Personality disorder ([place]) 03/07/15  . Sinus tachycardia 03/07/15   history of  . Suicidal thoughts 03/07/15    Past Surgical History:  Procedure Laterality Date  . COLONOSCOPY WITH PROPOFOL N/A 12/01/2018   Procedure: COLONOSCOPY WITH PROPOFOL;  Surgeon: Jonathon Bellows, MD;  Location: [facility] ENDOSCOPY;  Service: Gastroenterology;  Laterality: N/A;  . electroconvulsion therapy  03/07/15    Family History  Problem Relation Age of Onset  . Hypertension Father   . Diabetes Mother    Social History:  reports that she has never smoked. She has never used smokeless tobacco. She reports that she does not drink alcohol or use drugs.  Allergies:  Allergies  Allergen Reactions  . Prednisone     Increases blood sugar    (Not in a hospital admission)   Results for orders placed or performed during the hospital encounter of 05/21/19 (from the past 48 hour(s))  Glucose, capillary     Status: Abnormal   Collection Time: 05/21/19  9:09 AM  Result Value Ref Range   Glucose-Capillary 115 (H) 70 - 99 mg/dL   No results found.  Review of Systems  Constitutional: Negative.   HENT: Negative.   Eyes: Negative.   Respiratory: Negative.   Cardiovascular: Negative.   Gastrointestinal: Negative.   Musculoskeletal: Negative.   Skin: Negative.   Neurological: Negative.   Psychiatric/Behavioral: Positive for depression. Negative for  hallucinations, memory loss, substance abuse and suicidal ideas. The patient is not nervous/anxious and does not have insomnia.     Blood pressure (!) 156/87, temperature (!) 96.3 F (35.7 C), temperature source Tympanic, resp. rate 18, height 5\' 3"  (1.6 m), weight 114.8 kg, SpO2 100 %. Physical Exam  Nursing note and vitals reviewed. Constitutional: She appears well-developed and well-nourished.  HENT:  Head: Normocephalic and atraumatic.  Eyes: Pupils are equal, round, and reactive to light. Conjunctivae are normal.  Neck: Normal range of motion.  Cardiovascular: Regular rhythm and normal heart sounds.  Respiratory: Effort normal.  GI: Soft.  Musculoskeletal: Normal range of motion.  Neurological: She is alert.  Skin: Skin is warm and dry.  Psychiatric: She has a normal mood and affect. Her behavior is normal. Judgment and thought content normal.     Assessment/Plan Treatment today.  Follow-up 3 weeks because of scheduling.  [name], MD 05/21/2019, 11:03 AM

## 2019-05-21 NOTE — Transfer of Care (Signed)
Immediate Anesthesia Transfer of Care Note  Patient: Melinda [name]  Procedure(s) Performed: ECT TX  Patient Location: PACU  Anesthesia Type:General  Level of Consciousness: awake and patient cooperative  Airway & Oxygen Therapy: Patient Spontanous Breathing and Patient connected to face mask oxygen  Post-op Assessment: Report given to RN and Post -op Vital signs reviewed and stable  Post vital signs: Reviewed and stable  Last Vitals:  Vitals Value Taken Time  BP    Temp    Pulse 99 05/21/19 1122  Resp    SpO2 100 % 05/21/19 1122  Vitals shown include unvalidated device data.  Last Pain:  Vitals:   05/21/19 0922  TempSrc: Tympanic  PainSc: 0-No pain         Complications: No apparent anesthesia complications

## 2019-05-21 NOTE — Anesthesia Preprocedure Evaluation (Signed)
Anesthesia Evaluation  Patient identified by MRN, date of birth, ID band Patient awake    Reviewed: Allergy & Precautions, H&P , NPO status , reviewed documented beta blocker date and time   History of Anesthesia Complications Negative for: history of anesthetic complications  Airway Mallampati: III  TM Distance: >3 FB Neck ROM: limited    Dental  (+) Chipped   Pulmonary neg shortness of breath, sleep apnea , neg recent URI,    Pulmonary exam normal        Cardiovascular hypertension, (-) anginaNormal cardiovascular exam  1st degree AVB on EKG   Neuro/Psych PSYCHIATRIC DISORDERS Depression Bipolar Disorder  Neuromuscular disease    GI/Hepatic GERD  Medicated and Controlled,  Endo/Other  diabetesMorbid obesity  Renal/GU      Musculoskeletal   Abdominal   Peds  Hematology   Anesthesia Other Findings Past Medical History: No date: Depression No date: Diabetes mellitus without complication ([place]) 5/49/82: Diabetic peripheral neuropathy ([place]) 03/07/15: Diabetic peripheral neuropathy ([place]) 03/07/15: Diabetic peripheral neuropathy (HCC) No date: GERD (gastroesophageal reflux disease) 03/07/15: Hypercholesterolemia No date: Hypertension 03/07/15: Obesity 03/07/15: Personality disorder ([place]) 03/07/15: Sinus tachycardia     Comment:  history of 03/07/15: Suicidal thoughts  Past Surgical History: 12/01/2018: COLONOSCOPY WITH PROPOFOL; N/A     Comment:  Procedure: COLONOSCOPY WITH PROPOFOL;  Surgeon: Jonathon Bellows, MD;  Location: [facility] ENDOSCOPY;  Service:               Gastroenterology;  Laterality: N/A; 03/07/15: electroconvulsion therapy  BMI    Body Mass Index:  41.32 kg/m      Reproductive/Obstetrics                             Anesthesia Physical  Anesthesia Plan  ASA: III  Anesthesia Plan: General   Post-op Pain Management:    Induction: Intravenous  PONV Risk Score  and Plan: Treatment may vary due to age or medical condition and TIVA  Airway Management Planned: Natural Airway and Mask  Additional Equipment:   Intra-op Plan:   Post-operative Plan:   Informed Consent: I have reviewed the patients History and Physical, chart, labs and discussed the procedure including the risks, benefits and alternatives for the proposed anesthesia with the patient or authorized representative who has indicated his/her understanding and acceptance.     Dental Advisory Given  Plan Discussed with: CRNA  Anesthesia Plan Comments:         Anesthesia Quick Evaluation

## 2019-05-21 NOTE — Anesthesia Post-op Follow-up Note (Signed)
Anesthesia QCDR form completed.        

## 2019-05-21 NOTE — Anesthesia Procedure Notes (Signed)
Procedure Name: General with mask airway Date/Time: 05/21/2019 11:12 AM Performed by: Gayland Curry, CRNA Pre-anesthesia Checklist: Patient identified, Emergency Drugs available, Suction available, Patient being monitored and Timeout performed Patient Re-evaluated:Patient Re-evaluated prior to induction Oxygen Delivery Method: Circle system utilized Preoxygenation: Pre-oxygenation with 100% oxygen Induction Type: IV induction Airway Equipment and Method: Bite block

## 2019-05-22 NOTE — Anesthesia Postprocedure Evaluation (Signed)
Anesthesia Post Note  Patient: Lerlene M [place]  Procedure(s) Performed: ECT TX  Patient location during evaluation: PACU Anesthesia Type: General Level of consciousness: awake and alert Pain management: pain level controlled Vital Signs Assessment: post-procedure vital signs reviewed and stable Respiratory status: spontaneous breathing, nonlabored ventilation, respiratory function stable and patient connected to nasal cannula oxygen Cardiovascular status: blood pressure returned to baseline and stable Postop Assessment: no apparent nausea or vomiting Anesthetic complications: no     Last Vitals:  Vitals:   05/21/19 1153 05/21/19 1158  BP: 130/62   Pulse: 89 81  Resp: 20 18  Temp: 36.4 C   SpO2: 99%     Last Pain:  Vitals:   05/21/19 1158  TempSrc:   PainSc: 0-No pain                 Martha Clan

## 2019-05-31 ENCOUNTER — Telehealth: Payer: Self-pay | Admitting: Physician Assistant

## 2019-05-31 DIAGNOSIS — N950 Postmenopausal bleeding: Secondary | ICD-10-CM

## 2019-05-31 NOTE — Telephone Encounter (Signed)
Pt went into menopause 2 years ago and now she is starting to have some bleeding again off and on. No pain in lower abd area.  Please advise [phone]  Con Memos

## 2019-06-01 NOTE — Telephone Encounter (Signed)
Sometimes we can see that in the clinic here but she is going to need a further workup and likely biopsy. I would recommend we refer her to an OBYGN. If she is agreeable, please place the referral for OBGYN under post menopausal bleeding.

## 2019-06-01 NOTE — Telephone Encounter (Signed)
LVMTRC 

## 2019-06-01 NOTE — Telephone Encounter (Signed)
Pt calling back regarding have a period after 2 years.  Pt waiting for a return call.  Thanks, [employer]

## 2019-06-02 ENCOUNTER — Other Ambulatory Visit: Payer: Self-pay | Admitting: Physician Assistant

## 2019-06-02 DIAGNOSIS — E119 Type 2 diabetes mellitus without complications: Secondary | ICD-10-CM

## 2019-06-02 NOTE — Telephone Encounter (Signed)
Referral placed.

## 2019-06-02 NOTE — Telephone Encounter (Signed)
Pt agreed to a referral to GYN.  She states anyone that you recommend.     Contact number: [phone]   Thanks,    -Mickel Baas

## 2019-06-03 NOTE — Telephone Encounter (Signed)
Please schedule

## 2019-06-08 ENCOUNTER — Telehealth: Payer: Self-pay | Admitting: Obstetrics & Gynecology

## 2019-06-08 NOTE — Telephone Encounter (Signed)
[facility] referring for Post-menopausal bleeding. Called and left voicemail for patient to call back to be schedule

## 2019-06-10 ENCOUNTER — Other Ambulatory Visit: Payer: Self-pay | Admitting: Psychiatry

## 2019-06-11 ENCOUNTER — Encounter
Admission: RE | Admit: 2019-06-11 | Discharge: 2019-06-11 | Disposition: A | Discharge status: Home / Self Care | Payer: Medicare PPO | Source: Ambulatory Visit | Attending: Psychiatry | Admitting: Psychiatry

## 2019-06-11 ENCOUNTER — Encounter: Payer: Self-pay | Admitting: Certified Registered Nurse Anesthetist

## 2019-06-11 ENCOUNTER — Other Ambulatory Visit
Admission: RE | Admit: 2019-06-11 | Discharge: 2019-06-11 | Disposition: A | Discharge status: Home / Self Care | Payer: Medicare PPO | Source: Ambulatory Visit | Attending: Psychiatry | Admitting: Psychiatry

## 2019-06-11 ENCOUNTER — Other Ambulatory Visit: Payer: Self-pay

## 2019-06-11 DIAGNOSIS — Z20828 Contact with and (suspected) exposure to other viral communicable diseases: Secondary | ICD-10-CM | POA: Insufficient documentation

## 2019-06-11 DIAGNOSIS — E1140 Type 2 diabetes mellitus with diabetic neuropathy, unspecified: Secondary | ICD-10-CM | POA: Diagnosis not present

## 2019-06-11 DIAGNOSIS — Z79899 Other long term (current) drug therapy: Secondary | ICD-10-CM | POA: Diagnosis not present

## 2019-06-11 DIAGNOSIS — E669 Obesity, unspecified: Secondary | ICD-10-CM | POA: Diagnosis not present

## 2019-06-11 DIAGNOSIS — I10 Essential (primary) hypertension: Secondary | ICD-10-CM | POA: Diagnosis not present

## 2019-06-11 DIAGNOSIS — E1142 Type 2 diabetes mellitus with diabetic polyneuropathy: Secondary | ICD-10-CM | POA: Diagnosis not present

## 2019-06-11 DIAGNOSIS — E7800 Pure hypercholesterolemia, unspecified: Secondary | ICD-10-CM | POA: Diagnosis not present

## 2019-06-11 DIAGNOSIS — Z8249 Family history of ischemic heart disease and other diseases of the circulatory system: Secondary | ICD-10-CM | POA: Diagnosis not present

## 2019-06-11 DIAGNOSIS — F332 Major depressive disorder, recurrent severe without psychotic features: Secondary | ICD-10-CM | POA: Diagnosis not present

## 2019-06-11 DIAGNOSIS — Z6841 Body Mass Index (BMI) 40.0 and over, adult: Secondary | ICD-10-CM | POA: Diagnosis not present

## 2019-06-11 DIAGNOSIS — K219 Gastro-esophageal reflux disease without esophagitis: Secondary | ICD-10-CM | POA: Diagnosis not present

## 2019-06-11 DIAGNOSIS — F329 Major depressive disorder, single episode, unspecified: Secondary | ICD-10-CM | POA: Diagnosis not present

## 2019-06-11 LAB — GLUCOSE, CAPILLARY: Glucose-Capillary: 101 mg/dL — ABNORMAL HIGH (ref 70–99)

## 2019-06-11 LAB — SARS CORONAVIRUS 2 BY RT PCR (HOSPITAL ORDER, PERFORMED IN ~~LOC~~ HOSPITAL LAB): SARS Coronavirus 2: NEGATIVE

## 2019-06-11 MED ORDER — GLYCOPYRROLATE 0.2 MG/ML IJ SOLN
INTRAMUSCULAR | Status: AC
  Filled 2019-06-11: qty 1

## 2019-06-11 MED ORDER — GLYCOPYRROLATE 0.2 MG/ML IJ SOLN
0.4000 mg | Freq: Once | INTRAMUSCULAR | Status: AC
  Administered 2019-06-11: 09:00:00 0.4 mg via INTRAVENOUS

## 2019-06-11 MED ORDER — KETOROLAC TROMETHAMINE 30 MG/ML IJ SOLN
30.0000 mg | Freq: Once | INTRAMUSCULAR | Status: AC
  Administered 2019-06-11: 09:00:00 30 mg via INTRAVENOUS

## 2019-06-11 MED ORDER — LABETALOL HCL 5 MG/ML IV SOLN
INTRAVENOUS | Status: DC | PRN
  Administered 2019-06-11: 30 mg via INTRAVENOUS

## 2019-06-11 MED ORDER — SODIUM CHLORIDE 0.9 % IV SOLN
500.0000 mL | Freq: Once | INTRAVENOUS | Status: AC
  Administered 2019-06-11: 09:00:00 500 mL via INTRAVENOUS

## 2019-06-11 MED ORDER — KETOROLAC TROMETHAMINE 30 MG/ML IJ SOLN
INTRAMUSCULAR | Status: AC
  Administered 2019-06-11: 30 mg via INTRAVENOUS
  Filled 2019-06-11: qty 1

## 2019-06-11 MED ORDER — METHOHEXITAL SODIUM 100 MG/10ML IV SOSY
PREFILLED_SYRINGE | INTRAVENOUS | Status: DC | PRN
  Administered 2019-06-11: 70 mg via INTRAVENOUS

## 2019-06-11 MED ORDER — SUCCINYLCHOLINE CHLORIDE 20 MG/ML IJ SOLN
INTRAMUSCULAR | Status: DC | PRN
  Administered 2019-06-11: 100 mg via INTRAVENOUS

## 2019-06-11 MED ORDER — ESMOLOL HCL 100 MG/10ML IV SOLN
INTRAVENOUS | Status: DC | PRN
  Administered 2019-06-11: 20 mg via INTRAVENOUS

## 2019-06-11 MED ORDER — SODIUM CHLORIDE 0.9 % IV SOLN
INTRAVENOUS | Status: DC | PRN
  Administered 2019-06-11: 11:00:00 via INTRAVENOUS

## 2019-06-11 MED ORDER — GLYCOPYRROLATE 0.2 MG/ML IJ SOLN
INTRAMUSCULAR | Status: AC
  Administered 2019-06-11: 0.4 mg via INTRAVENOUS
  Filled 2019-06-11: qty 1

## 2019-06-11 NOTE — Anesthesia Post-op Follow-up Note (Signed)
Anesthesia QCDR form completed.        

## 2019-06-11 NOTE — Anesthesia Preprocedure Evaluation (Signed)
Anesthesia Evaluation  Patient identified by MRN, date of birth, ID band Patient awake    Reviewed: Allergy & Precautions, H&P , NPO status , Patient's Chart, lab work & pertinent test results  History of Anesthesia Complications Negative for: history of anesthetic complications  Airway Mallampati: III  TM Distance: >3 FB Neck ROM: full    Dental  (+) Chipped   Pulmonary sleep apnea , neg COPD, neg recent URI,           Cardiovascular hypertension, (-) angina     Neuro/Psych PSYCHIATRIC DISORDERS Depression Bipolar Disorder negative neurological ROS     GI/Hepatic Neg liver ROS, GERD  Controlled,  Endo/Other  diabetesMorbid obesity  Renal/GU negative Renal ROS  negative genitourinary   Musculoskeletal   Abdominal   Peds  Hematology negative hematology ROS (+)   Anesthesia Other Findings Past Medical History: No date: Depression No date: Diabetes mellitus without complication ([place]) 0/37/04: Diabetic peripheral neuropathy ([place]) 03/07/15: Diabetic peripheral neuropathy ([place]) 03/07/15: Diabetic peripheral neuropathy (HCC) No date: GERD (gastroesophageal reflux disease) 03/07/15: Hypercholesterolemia No date: Hypertension 03/07/15: Obesity 03/07/15: Personality disorder ([place]) 03/07/15: Sinus tachycardia     Comment:  history of 03/07/15: Suicidal thoughts  Past Surgical History: 12/01/2018: COLONOSCOPY WITH PROPOFOL; N/A     Comment:  Procedure: COLONOSCOPY WITH PROPOFOL;  Surgeon: Jonathon Bellows, MD;  Location: [facility] ENDOSCOPY;  Service:               Gastroenterology;  Laterality: N/A; 03/07/15: electroconvulsion therapy  BMI    Body Mass Index: 44.52 kg/m      Reproductive/Obstetrics negative OB ROS                             Anesthesia Physical Anesthesia Plan  ASA: III  Anesthesia Plan: General   Post-op Pain Management:    Induction:   PONV Risk Score and  Plan:   Airway Management Planned: Mask  Additional Equipment:   Intra-op Plan:   Post-operative Plan:   Informed Consent: I have reviewed the patients History and Physical, chart, labs and discussed the procedure including the risks, benefits and alternatives for the proposed anesthesia with the patient or authorized representative who has indicated his/her understanding and acceptance.     Dental Advisory Given  Plan Discussed with: Anesthesiologist and CRNA  Anesthesia Plan Comments:         Anesthesia Quick Evaluation

## 2019-06-11 NOTE — Transfer of Care (Signed)
Immediate Anesthesia Transfer of Care Note  Patient: Melinda [name]  Procedure(s) Performed: ECT TX  Patient Location: PACU  Anesthesia Type:General  Level of Consciousness: awake, drowsy and patient cooperative  Airway & Oxygen Therapy: Patient Spontanous Breathing and Patient connected to face mask oxygen  Post-op Assessment: Report given to RN and Post -op Vital signs reviewed and stable  Post vital signs: Reviewed and stable  Last Vitals:  Vitals Value Taken Time  BP 127/86 06/11/19 1112  Temp 37.4 C 06/11/19 1110  Pulse 97 06/11/19 1113  Resp 21 06/11/19 1113  SpO2 95 % 06/11/19 1113  Vitals shown include unvalidated device data.  Last Pain:  Vitals:   06/11/19 1110  TempSrc:   PainSc: Asleep         Complications: No apparent anesthesia complications

## 2019-06-11 NOTE — H&P (Signed)
Melinda [name] is an 48 y.o. female.   Chief Complaint: Chronic depression about the same as usual.  Some minor self injury HPI: History of recurrent severe depression with some response to ECT  Past Medical History:  Diagnosis Date  . Depression   . Diabetes mellitus without complication ([place])   . Diabetic peripheral neuropathy ([place]) 03/07/15  . Diabetic peripheral neuropathy ([place]) 03/07/15  . Diabetic peripheral neuropathy ([place]) 03/07/15  . GERD (gastroesophageal reflux disease)   . Hypercholesterolemia 03/07/15  . Hypertension   . Obesity 03/07/15  . Personality disorder ([place]) 03/07/15  . Sinus tachycardia 03/07/15   history of  . Suicidal thoughts 03/07/15    Past Surgical History:  Procedure Laterality Date  . COLONOSCOPY WITH PROPOFOL N/A 12/01/2018   Procedure: COLONOSCOPY WITH PROPOFOL;  Surgeon: Jonathon Bellows, MD;  Location: [facility] ENDOSCOPY;  Service: Gastroenterology;  Laterality: N/A;  . electroconvulsion therapy  03/07/15    Family History  Problem Relation Age of Onset  . Hypertension Father   . Diabetes Mother    Social History:  reports that she has never smoked. She has never used smokeless tobacco. She reports that she does not drink alcohol or use drugs.  Allergies:  Allergies  Allergen Reactions  . Prednisone     Increases blood sugar    (Not in a hospital admission)   Results for orders placed or performed during the hospital encounter of 06/11/19 (from the past 48 hour(s))  Glucose, capillary     Status: Abnormal   Collection Time: 06/11/19  9:04 AM  Result Value Ref Range   Glucose-Capillary 101 (H) 70 - 99 mg/dL   No results found.  Review of Systems  Constitutional: Negative.   HENT: Negative.   Eyes: Negative.   Respiratory: Negative.   Cardiovascular: Negative.   Gastrointestinal: Negative.   Musculoskeletal: Negative.   Skin: Negative.   Neurological: Negative.   Psychiatric/Behavioral: Negative.     Blood pressure (!) 154/91, pulse  92, temperature 98.1 F (36.7 C), temperature source Oral, resp. rate 18, height 5\' 3"  (1.6 m), weight 114 kg, SpO2 98 %. Physical Exam  Nursing note and vitals reviewed. Constitutional: She appears well-developed and well-nourished.  HENT:  Head: Normocephalic and atraumatic.  Eyes: Pupils are equal, round, and reactive to light. Conjunctivae are normal.  Neck: Normal range of motion.  Cardiovascular: Regular rhythm and normal heart sounds.  Respiratory: Effort normal.  GI: Soft.  Musculoskeletal: Normal range of motion.  Neurological: She is alert.  Skin: Skin is warm and dry.  Psychiatric: She has a normal mood and affect. Her behavior is normal. Judgment and thought content normal.     Assessment/Plan Resume maintenance ECT with treatment today and follow-up in 2 weeks  [name], MD 06/11/2019, 10:54 AM

## 2019-06-11 NOTE — Discharge Instructions (Signed)
1)  The drugs that you have been given will stay in your system until tomorrow so for the       next 24 hours you should not:  A. Drive an automobile  B. Make any legal decisions  C. Drink any alcoholic beverages  2)  You may resume your regular meals upon return home.  3)  A responsible adult must take you home.  Someone should stay with you for a few          hours, then be available by phone for the remainder of the treatment day.  4)  You May experience any of the following symptoms:  Headache, Nausea and a dry mouth (due to the medications you were given),  temporary memory loss and some confusion, or sore muscles (a warm bath  should help this).  If you you experience any of these symptoms let us know on                your return visit.  5)  Report any of the following: any acute discomfort, severe headache, or temperature        greater than 100.5 F.   Also report any unusual redness, swelling, drainage, or pain         at your IV site.    You may report Symptoms to:  [place] [facility]          Phone: [phone], ECT Department           or Dr. Prescott Gum office [phone]  6)  Your next ECT Treatment is Friday August 14   We will call 2 days prior to your scheduled appointment for arrival times.  7)  Nothing to eat or drink after midnight the night before your procedure.  8)  Take    With a sip of water the morning of your procedure.  9)  Other Instructions: Call [phone] to cancel the morning of your procedure due         to illness or emergency.  10) We will call within 72 hours to assess how you are feeling.

## 2019-06-12 NOTE — Anesthesia Postprocedure Evaluation (Signed)
Anesthesia Post Note  Patient: Melinda [name]  Procedure(s) Performed: ECT TX  Patient location during evaluation: PACU Anesthesia Type: General Level of consciousness: awake and alert Pain management: pain level controlled Vital Signs Assessment: post-procedure vital signs reviewed and stable Respiratory status: spontaneous breathing, nonlabored ventilation and respiratory function stable Cardiovascular status: blood pressure returned to baseline and stable Postop Assessment: no apparent nausea or vomiting Anesthetic complications: no     Last Vitals:  Vitals:   06/11/19 1136 06/11/19 1208  BP: 131/90 128/89  Pulse: 94 85  Resp: (!) 21 18  Temp:    SpO2: 94%     Last Pain:  Vitals:   06/11/19 1208  TempSrc:   PainSc: 0-No pain                 Durenda Hurt

## 2019-06-14 ENCOUNTER — Encounter: Payer: Self-pay | Admitting: Obstetrics and Gynecology

## 2019-06-14 ENCOUNTER — Other Ambulatory Visit: Payer: Self-pay

## 2019-06-14 ENCOUNTER — Ambulatory Visit (INDEPENDENT_AMBULATORY_CARE_PROVIDER_SITE_OTHER): Payer: Medicare PPO | Admitting: Obstetrics and Gynecology

## 2019-06-14 VITALS — BP 124/80 | HR 90 | Ht 63.5 in | Wt 249.0 lb

## 2019-06-14 DIAGNOSIS — N950 Postmenopausal bleeding: Secondary | ICD-10-CM | POA: Diagnosis not present

## 2019-06-14 DIAGNOSIS — R232 Flushing: Secondary | ICD-10-CM | POA: Diagnosis not present

## 2019-06-14 NOTE — Progress Notes (Signed)
Patient ID: Melinda [name], female   DOB: 1971-07-03, 48 y.o.   MRN: [id number]  Reason for Consult: Referral (Bleeding started 05/28/19 like a regular period no pain, lasted about 4 days. Hasnt had period in almost 2 years )   Referred by Trinna Post, PA-C  Subjective:     HPI:  Melinda [name] is a 48 y.o. female . She presents today with complaints of postmenopausal bleeding x 2 days. Light and small in amount. Has not had vaginal bleeding in 2 years   Gynecological History Menarche: unsure, 45 or 12?  Menopause: unsure, reports no period in 2 years, would assume that means menopause t 46 LMP: 2018 Describes periods as monthly before menopause Last pap smear: 2020, NIL Last Mammogram: 2020, BIRADS1 History of STDs: hx of herpes, no outbreaks Sexually Active: nto in over 20 years  Obstetrical History 1991: Hx of 1 full term SVD, female  Past Medical History:  Diagnosis Date  . Depression   . Diabetes mellitus without complication ([place])   . Diabetic peripheral neuropathy ([place]) 03/07/15  . Diabetic peripheral neuropathy ([place]) 03/07/15  . Diabetic peripheral neuropathy ([place]) 03/07/15  . GERD (gastroesophageal reflux disease)   . Hypercholesterolemia 03/07/15  . Hypertension   . Obesity 03/07/15  . Personality disorder ([place]) 03/07/15  . Sinus tachycardia 03/07/15   history of  . Suicidal thoughts 03/07/15   Family History  Problem Relation Age of Onset  . Hypertension Father   . Diabetes Mother    Past Surgical History:  Procedure Laterality Date  . COLONOSCOPY WITH PROPOFOL N/A 12/01/2018   Procedure: COLONOSCOPY WITH PROPOFOL;  Surgeon: Jonathon Bellows, MD;  Location: [facility] ENDOSCOPY;  Service: Gastroenterology;  Laterality: N/A;  . electroconvulsion therapy  03/07/15    Short Social History:  Social History   Tobacco Use  . Smoking status: Never Smoker  . Smokeless tobacco: Never Used  Substance Use Topics  . Alcohol use: No    Allergies  Allergen Reactions   . Prednisone     Increases blood sugar    Current Outpatient Medications  Medication Sig Dispense Refill  . atorvastatin (LIPITOR) 10 MG tablet Take 1 tablet (10 mg total) by mouth at bedtime. 90 tablet 1  . buPROPion (WELLBUTRIN XL) 300 MG 24 hr tablet TAKE 1 TABLET EVERY DAY 90 tablet 0  . escitalopram (LEXAPRO) 20 MG tablet TAKE 1 TABLET EVERY DAY 90 tablet 0  . glucose blood (ACCU-CHEK AVIVA PLUS) test strip Use as instructed 100 each 12  . Lancets Misc. (ACCU-CHEK FASTCLIX LANCET) KIT     . lisinopril (ZESTRIL) 10 MG tablet TAKE 1 TABLET (10 MG TOTAL) BY MOUTH DAILY. 90 tablet 0  . metFORMIN (GLUCOPHAGE-XR) 500 MG 24 hr tablet TAKE 1 TABLET (500 MG TOTAL) BY MOUTH DAILY WITH BREAKFAST FOR 14 DAYS, THEN 1 TABLET (500 MG TOTAL) TWO TIMES DAILY. 194 tablet 0  . ziprasidone (GEODON) 80 MG capsule TAKE 1 CAPSULE TWO TIMES DAILY WITH A MEAL. 180 capsule 0   No current facility-administered medications for this visit.     REVIEW OF SYSTEMS      Objective:  Objective   Vitals:   06/14/19 1409  BP: 124/80  Pulse: 90  SpO2: 97%  Weight: 249 lb (112.9 kg)  Height: 5' 3.5" (1.613 m)   Body mass index is 43.42 kg/m.  Physical Exam Vitals signs and nursing note reviewed. Exam conducted with a chaperone present.  Constitutional:      Appearance:  She is well-developed.  HENT:     Head: Normocephalic and atraumatic.  Eyes:     Pupils: Pupils are equal, round, and reactive to light.  Cardiovascular:     Rate and Rhythm: Normal rate and regular rhythm.  Pulmonary:     Effort: Pulmonary effort is normal. No respiratory distress.  Genitourinary:    Comments: External: Normal appearing vulva. No lesions noted.  Speculum examination: Normal appearing cervix. Cervical stenosis noted.  No blood in the vaginal vault. Scant normal  discharge.  Bimanual examination: Uterus midline, non-tender, normal in size, shape and contour.  No CMT. No adnexal masses. No adnexal tenderness. Pelvis not  fixed.    Skin:    General: Skin is warm and dry.  Neurological:     Mental Status: She is alert and oriented to person, place, and time.  Psychiatric:        Behavior: Behavior normal.        Thought Content: Thought content normal.        Judgment: Judgment normal.    Wet Prep: PH: normal Clue Cells: Negative Fungal elements: Negative Trichomonas: Negative       Assessment/Plan:     48 yo with postmenopausal bleeding Patient is skeptical of is she is in menopause. Will check [place] and estradiol to confirm Will follow up for a pelvic US.  Will consider endometrial biopsy based on the result.  Wet mount today normal  More than 30 minutes were spent face to face with the patient in the room with more than 50% of the time spent providing counseling and discussing the plan of management.    Adrian Prows MD [facility], [facility] 06/14/2019 2:38 PM

## 2019-06-15 LAB — ESTRADIOL: Estradiol: 20.1 pg/mL

## 2019-06-15 LAB — FOLLICLE STIMULATING HORMONE: FSH: 35.8 m[IU]/mL

## 2019-06-16 ENCOUNTER — Other Ambulatory Visit: Payer: Self-pay | Admitting: Physician Assistant

## 2019-06-16 ENCOUNTER — Other Ambulatory Visit: Payer: Self-pay | Admitting: Psychiatry

## 2019-06-16 DIAGNOSIS — I10 Essential (primary) hypertension: Secondary | ICD-10-CM

## 2019-06-16 NOTE — Progress Notes (Signed)
Called 06/16/2019, left message to return call. Results suggest that she is in menopause.

## 2019-06-21 ENCOUNTER — Other Ambulatory Visit: Payer: Self-pay | Admitting: Physician Assistant

## 2019-06-21 DIAGNOSIS — E119 Type 2 diabetes mellitus without complications: Secondary | ICD-10-CM

## 2019-06-22 NOTE — Telephone Encounter (Signed)
L.O.V. was 05/13/2019.

## 2019-06-25 ENCOUNTER — Encounter: Payer: Self-pay | Admitting: Anesthesiology

## 2019-06-25 ENCOUNTER — Encounter
Admission: RE | Admit: 2019-06-25 | Discharge: 2019-06-25 | Disposition: A | Discharge status: Home / Self Care | Payer: Medicare PPO | Source: Ambulatory Visit | Attending: Psychiatry | Admitting: Psychiatry

## 2019-06-25 ENCOUNTER — Other Ambulatory Visit: Payer: Self-pay

## 2019-06-25 ENCOUNTER — Other Ambulatory Visit: Payer: Self-pay | Admitting: Psychiatry

## 2019-06-25 DIAGNOSIS — G4730 Sleep apnea, unspecified: Secondary | ICD-10-CM | POA: Diagnosis not present

## 2019-06-25 DIAGNOSIS — F329 Major depressive disorder, single episode, unspecified: Secondary | ICD-10-CM | POA: Insufficient documentation

## 2019-06-25 DIAGNOSIS — I10 Essential (primary) hypertension: Secondary | ICD-10-CM | POA: Insufficient documentation

## 2019-06-25 DIAGNOSIS — Z20828 Contact with and (suspected) exposure to other viral communicable diseases: Secondary | ICD-10-CM | POA: Diagnosis not present

## 2019-06-25 DIAGNOSIS — F332 Major depressive disorder, recurrent severe without psychotic features: Secondary | ICD-10-CM

## 2019-06-25 DIAGNOSIS — E1142 Type 2 diabetes mellitus with diabetic polyneuropathy: Secondary | ICD-10-CM | POA: Insufficient documentation

## 2019-06-25 DIAGNOSIS — K219 Gastro-esophageal reflux disease without esophagitis: Secondary | ICD-10-CM | POA: Insufficient documentation

## 2019-06-25 DIAGNOSIS — Z01812 Encounter for preprocedural laboratory examination: Secondary | ICD-10-CM | POA: Insufficient documentation

## 2019-06-25 DIAGNOSIS — Z888 Allergy status to other drugs, medicaments and biological substances status: Secondary | ICD-10-CM | POA: Diagnosis not present

## 2019-06-25 DIAGNOSIS — E7800 Pure hypercholesterolemia, unspecified: Secondary | ICD-10-CM | POA: Diagnosis not present

## 2019-06-25 DIAGNOSIS — E1140 Type 2 diabetes mellitus with diabetic neuropathy, unspecified: Secondary | ICD-10-CM | POA: Diagnosis not present

## 2019-06-25 LAB — SARS CORONAVIRUS 2 BY RT PCR (HOSPITAL ORDER, PERFORMED IN ~~LOC~~ HOSPITAL LAB): SARS Coronavirus 2: NEGATIVE

## 2019-06-25 LAB — GLUCOSE, CAPILLARY: Glucose-Capillary: 109 mg/dL — ABNORMAL HIGH (ref 70–99)

## 2019-06-25 MED ORDER — GLYCOPYRROLATE 0.2 MG/ML IJ SOLN
INTRAMUSCULAR | Status: AC
  Administered 2019-06-25: 0.4 mg via INTRAVENOUS
  Filled 2019-06-25: qty 2

## 2019-06-25 MED ORDER — ESMOLOL HCL 100 MG/10ML IV SOLN
INTRAVENOUS | Status: DC | PRN
  Administered 2019-06-25: 20 mg via INTRAVENOUS

## 2019-06-25 MED ORDER — LABETALOL HCL 5 MG/ML IV SOLN
INTRAVENOUS | Status: AC
  Filled 2019-06-25: qty 4

## 2019-06-25 MED ORDER — SUCCINYLCHOLINE CHLORIDE 20 MG/ML IJ SOLN
INTRAMUSCULAR | Status: AC
  Filled 2019-06-25: qty 1

## 2019-06-25 MED ORDER — LABETALOL HCL 5 MG/ML IV SOLN
INTRAVENOUS | Status: DC | PRN
  Administered 2019-06-25: 30 mg via INTRAVENOUS

## 2019-06-25 MED ORDER — ESMOLOL HCL 100 MG/10ML IV SOLN
INTRAVENOUS | Status: AC
  Filled 2019-06-25: qty 10

## 2019-06-25 MED ORDER — SUCCINYLCHOLINE CHLORIDE 200 MG/10ML IV SOSY
PREFILLED_SYRINGE | INTRAVENOUS | Status: DC | PRN
  Administered 2019-06-25: 100 mg via INTRAVENOUS

## 2019-06-25 MED ORDER — METHOHEXITAL SODIUM 0.5 G IJ SOLR
INTRAMUSCULAR | Status: AC
  Filled 2019-06-25: qty 500

## 2019-06-25 MED ORDER — METHOHEXITAL SODIUM 100 MG/10ML IV SOSY
PREFILLED_SYRINGE | INTRAVENOUS | Status: DC | PRN
  Administered 2019-06-25: 70 mg via INTRAVENOUS

## 2019-06-25 MED ORDER — KETOROLAC TROMETHAMINE 30 MG/ML IJ SOLN
30.0000 mg | Freq: Once | INTRAMUSCULAR | Status: AC
  Administered 2019-06-25: 30 mg via INTRAVENOUS

## 2019-06-25 MED ORDER — SODIUM CHLORIDE 0.9 % IV SOLN
500.0000 mL | Freq: Once | INTRAVENOUS | Status: AC
  Administered 2019-06-25: 11:00:00 500 mL via INTRAVENOUS

## 2019-06-25 MED ORDER — GLYCOPYRROLATE 0.2 MG/ML IJ SOLN
0.4000 mg | Freq: Once | INTRAMUSCULAR | Status: AC
  Administered 2019-06-25: 0.4 mg via INTRAVENOUS

## 2019-06-25 MED ORDER — SODIUM CHLORIDE 0.9 % IV SOLN
INTRAVENOUS | Status: DC | PRN
  Administered 2019-06-25: 10:00:00 via INTRAVENOUS

## 2019-06-25 MED ORDER — KETOROLAC TROMETHAMINE 30 MG/ML IJ SOLN
INTRAMUSCULAR | Status: AC
  Administered 2019-06-25: 11:00:00 30 mg via INTRAVENOUS
  Filled 2019-06-25: qty 1

## 2019-06-25 NOTE — Procedures (Signed)
ECT SERVICES Physician's Interval Evaluation & Treatment Note  Patient Identification: Melinda [name] MRN:  [id number] Date of Evaluation:  06/25/2019 TX #: [id number]  MADRS:   MMSE:   P.E. Findings:  No change in physical exam  Psychiatric Interval Note:  Continued blunted and mildly depressed not psychotic.  Continues to feel ECT is helpful  Subjective:  Patient is a 48 y.o. female seen for evaluation for Electroconvulsive Therapy. No particular change  Treatment Summary:   []   Right Unilateral             [x]  Bilateral   % Energy : 1.0 ms 35%   Impedance: 1370 ohms  Seizure Energy Index: 3908 V squared  Postictal Suppression Index: 85%  Seizure Concordance Index: 86%  Medications  Pre Shock: Robinul 0.4 mg Toradol 30 mg labetalol 30 mg esmolol 20 mg Brevital 70 mg succinylcholine 100 mg  Post Shock:    Seizure Duration: 36 seconds EMG 73 seconds EEG   Comments: Follow-up 2 weeks  Lungs:  [x]   Clear to auscultation               []  Other:   Heart:    [x]   Regular rhythm             []  irregular rhythm    [x]   Previous H&P reviewed, patient examined and there are NO CHANGES                 []   Previous H&P reviewed, patient examined and there are changes noted.   [name], MD 8/14/202011:38 AM

## 2019-06-25 NOTE — Anesthesia Procedure Notes (Signed)
Date/Time: 06/25/2019 11:43 AM Performed by: Dionne Bucy, CRNA Pre-anesthesia Checklist: Patient identified, Emergency Drugs available, Suction available and Patient being monitored Patient Re-evaluated:Patient Re-evaluated prior to induction Oxygen Delivery Method: Circle system utilized Preoxygenation: Pre-oxygenation with 100% oxygen Induction Type: IV induction Ventilation: Mask ventilation without difficulty and Mask ventilation throughout procedure Airway Equipment and Method: Bite block Placement Confirmation: positive ETCO2 Dental Injury: Teeth and Oropharynx as per pre-operative assessment

## 2019-06-25 NOTE — Discharge Instructions (Signed)
1)  The drugs that you have been given will stay in your system until tomorrow so for the       next 24 hours you should not:  A. Drive an automobile  B. Make any legal decisions  C. Drink any alcoholic beverages  2)  You may resume your regular meals upon return home.  3)  A responsible adult must take you home.  Someone should stay with you for a few          hours, then be available by phone for the remainder of the treatment day.  4)  You May experience any of the following symptoms:  Headache, Nausea and a dry mouth (due to the medications you were given),  temporary memory loss and some confusion, or sore muscles (a warm bath  should help this).  If you you experience any of these symptoms let us know on                your return visit.  5)  Report any of the following: any acute discomfort, severe headache, or temperature        greater than 100.5 F.   Also report any unusual redness, swelling, drainage, or pain         at your IV site.    You may report Symptoms to:  [place] at [facility]          Phone: [phone], ECT Department           or Dr. Prescott Gum office [phone]  6)  Your next ECT Treatment is Friday August 28 at 8:00  We will call 2 days prior to your scheduled appointment for arrival times.  7)  Nothing to eat or drink after midnight the night before your procedure.  8)  Take    With a sip of water the morning of your procedure.  9)  Other Instructions: Call [phone] to cancel the morning of your procedure due         to illness or emergency.  10) We will call within 72 hours to assess how you are feeling.

## 2019-06-25 NOTE — H&P (Signed)
Melinda [name] is an 48 y.o. female.   Chief Complaint: Chronic depression HPI: History of recurrent severe depression with good response to ECT  Past Medical History:  Diagnosis Date  . Depression   . Diabetes mellitus without complication ([place])   . Diabetic peripheral neuropathy ([place]) 03/07/15  . Diabetic peripheral neuropathy ([place]) 03/07/15  . Diabetic peripheral neuropathy ([place]) 03/07/15  . GERD (gastroesophageal reflux disease)   . Hypercholesterolemia 03/07/15  . Hypertension   . Obesity 03/07/15  . Personality disorder ([place]) 03/07/15  . Sinus tachycardia 03/07/15   history of  . Suicidal thoughts 03/07/15    Past Surgical History:  Procedure Laterality Date  . COLONOSCOPY WITH PROPOFOL N/A 12/01/2018   Procedure: COLONOSCOPY WITH PROPOFOL;  Surgeon: Jonathon Bellows, MD;  Location: [facility] ENDOSCOPY;  Service: Gastroenterology;  Laterality: N/A;  . electroconvulsion therapy  03/07/15    Family History  Problem Relation Age of Onset  . Hypertension Father   . Diabetes Mother    Social History:  reports that she has never smoked. She has never used smokeless tobacco. She reports that she does not drink alcohol or use drugs.  Allergies:  Allergies  Allergen Reactions  . Prednisone     Increases blood sugar    (Not in a hospital admission)   Results for orders placed or performed during the hospital encounter of 06/25/19 (from the past 48 hour(s))  Glucose, capillary     Status: Abnormal   Collection Time: 06/25/19  9:49 AM  Result Value Ref Range   Glucose-Capillary 109 (H) 70 - 99 mg/dL   No results found.  Review of Systems  Constitutional: Negative.   HENT: Negative.   Eyes: Negative.   Respiratory: Negative.   Cardiovascular: Negative.   Gastrointestinal: Negative.   Musculoskeletal: Negative.   Skin: Negative.   Neurological: Negative.   Psychiatric/Behavioral: Positive for depression. Negative for hallucinations, memory loss, substance abuse and suicidal  ideas. The patient is not nervous/anxious and does not have insomnia.     Blood pressure 134/80, pulse 94, temperature 98.3 F (36.8 C), temperature source Oral, resp. rate 18, SpO2 100 %. Physical Exam  Nursing note and vitals reviewed. Constitutional: She appears well-developed and well-nourished.  HENT:  Head: Normocephalic and atraumatic.  Eyes: Pupils are equal, round, and reactive to light. Conjunctivae are normal.  Neck: Normal range of motion.  Cardiovascular: Regular rhythm and normal heart sounds.  Respiratory: Effort normal and breath sounds normal.  GI: Soft.  Musculoskeletal: Normal range of motion.  Neurological: She is alert.  Skin: Skin is warm and dry.  Psychiatric: Judgment normal. Her affect is blunt. Her speech is delayed. She is slowed. Thought content is not paranoid. Cognition and memory are normal. She expresses no homicidal and no suicidal ideation.     Assessment/Plan Treatment today follow-up 2 weeks  [name], MD 06/25/2019, 11:36 AM

## 2019-06-25 NOTE — Transfer of Care (Signed)
Immediate Anesthesia Transfer of Care Note  Patient: Melinda [name]  Procedure(s) Performed: ECT TX  Patient Location: PACU  Anesthesia Type:General  Level of Consciousness: sedated  Airway & Oxygen Therapy: Patient Spontanous Breathing and Patient connected to face mask oxygen  Post-op Assessment: Report given to RN and Post -op Vital signs reviewed and stable  Post vital signs: Reviewed and stable  Last Vitals:  Vitals Value Taken Time  BP 138/78 06/25/19 1154  Temp    Pulse 99 06/25/19 1157  Resp 14 06/25/19 1157  SpO2 100 % 06/25/19 1157  Vitals shown include unvalidated device data.  Last Pain:  Vitals:   06/25/19 0948  TempSrc: Oral  PainSc: 0-No pain         Complications: No apparent anesthesia complications

## 2019-06-25 NOTE — Anesthesia Post-op Follow-up Note (Signed)
Anesthesia QCDR form completed.        

## 2019-06-25 NOTE — Anesthesia Preprocedure Evaluation (Signed)
Anesthesia Evaluation  Patient identified by MRN, date of birth, ID band Patient awake    Reviewed: Allergy & Precautions, H&P , NPO status , Patient's Chart, lab work & pertinent test results  History of Anesthesia Complications Negative for: history of anesthetic complications  Airway Mallampati: III  TM Distance: >3 FB Neck ROM: full    Dental  (+) Chipped   Pulmonary sleep apnea , neg COPD, neg recent URI,           Cardiovascular hypertension, (-) angina     Neuro/Psych PSYCHIATRIC DISORDERS Depression Bipolar Disorder negative neurological ROS     GI/Hepatic Neg liver ROS, GERD  Controlled,  Endo/Other  diabetesMorbid obesity  Renal/GU negative Renal ROS  negative genitourinary   Musculoskeletal   Abdominal   Peds  Hematology negative hematology ROS (+)   Anesthesia Other Findings Past Medical History: No date: Depression No date: Diabetes mellitus without complication ([place]) 03/07/05: Diabetic peripheral neuropathy ([place]) 03/07/15: Diabetic peripheral neuropathy ([place]) 03/07/15: Diabetic peripheral neuropathy ([facility]) No date: GERD (gastroesophageal reflux disease) 03/07/15: Hypercholesterolemia No date: Hypertension 03/07/15: Obesity 03/07/15: Personality disorder ([place]) 03/07/15: Sinus tachycardia     Comment:  history of 03/07/15: Suicidal thoughts  Past Surgical History: 12/01/2018: COLONOSCOPY WITH PROPOFOL; N/A     Comment:  Procedure: COLONOSCOPY WITH PROPOFOL;  Surgeon: Jonathon Bellows, MD;  Location: [facility] ENDOSCOPY;  Service:               Gastroenterology;  Laterality: N/A; 03/07/15: electroconvulsion therapy  BMI    Body Mass Index: 44.52 kg/m      Reproductive/Obstetrics negative OB ROS                             Anesthesia Physical  Anesthesia Plan  ASA: III  Anesthesia Plan: General   Post-op Pain Management:    Induction:   PONV Risk Score and  Plan:   Airway Management Planned: Mask  Additional Equipment:   Intra-op Plan:   Post-operative Plan:   Informed Consent: I have reviewed the patients History and Physical, chart, labs and discussed the procedure including the risks, benefits and alternatives for the proposed anesthesia with the patient or authorized representative who has indicated his/her understanding and acceptance.     Dental Advisory Given  Plan Discussed with: Anesthesiologist and CRNA  Anesthesia Plan Comments:         Anesthesia Quick Evaluation

## 2019-06-25 NOTE — Anesthesia Postprocedure Evaluation (Signed)
Anesthesia Post Note  Patient: Melinda [name]  Procedure(s) Performed: ECT TX  Patient location during evaluation: PACU Anesthesia Type: General Level of consciousness: awake and alert Pain management: pain level controlled Vital Signs Assessment: post-procedure vital signs reviewed and stable Respiratory status: spontaneous breathing, nonlabored ventilation and respiratory function stable Cardiovascular status: blood pressure returned to baseline and stable Postop Assessment: no apparent nausea or vomiting Anesthetic complications: no     Last Vitals:  Vitals:   06/25/19 1204 06/25/19 1232  BP: 127/68 127/77  Pulse: 99 95  Resp: (!) 25 18  Temp:    SpO2: 100%     Last Pain:  Vitals:   06/25/19 1232  TempSrc:   PainSc: 0-No pain                 Durenda Hurt

## 2019-06-29 ENCOUNTER — Other Ambulatory Visit: Payer: Self-pay | Admitting: Psychiatry

## 2019-06-29 MED ORDER — ESCITALOPRAM OXALATE 20 MG PO TABS: 20.0000 mg | ORAL_TABLET | Freq: Every day | ORAL | 1 refills | Status: DC

## 2019-06-29 MED ORDER — ZIPRASIDONE HCL 80 MG PO CAPS: ORAL_CAPSULE | ORAL | 1 refills | Status: DC

## 2019-06-29 MED ORDER — BUPROPION HCL ER (XL) 300 MG PO TB24: 300.0000 mg | ORAL_TABLET | Freq: Every day | ORAL | 1 refills | Status: DC

## 2019-07-05 ENCOUNTER — Other Ambulatory Visit: Payer: Self-pay

## 2019-07-05 ENCOUNTER — Encounter: Payer: Self-pay | Admitting: Obstetrics and Gynecology

## 2019-07-05 ENCOUNTER — Ambulatory Visit (INDEPENDENT_AMBULATORY_CARE_PROVIDER_SITE_OTHER): Payer: Medicare PPO

## 2019-07-05 ENCOUNTER — Ambulatory Visit (INDEPENDENT_AMBULATORY_CARE_PROVIDER_SITE_OTHER): Payer: Medicare PPO | Admitting: Obstetrics and Gynecology

## 2019-07-05 VITALS — BP 120/70 | Ht 63.5 in | Wt 246.0 lb

## 2019-07-05 DIAGNOSIS — D251 Intramural leiomyoma of uterus: Secondary | ICD-10-CM | POA: Diagnosis not present

## 2019-07-05 DIAGNOSIS — N950 Postmenopausal bleeding: Secondary | ICD-10-CM

## 2019-07-05 NOTE — Progress Notes (Signed)
Patient ID: Melinda [name], female   DOB: 1971/06/04, 48 y.o.   MRN: [id number]  Reason for Consult: Follow-up (U/S follow up, pt having alot of vaginal diacharge )   Referred by Trinna Post, PA-C  Subjective:     HPI:  Melinda [name] is a 48 y.o. female. She is following up for postmenopausal bleeding and an [place]. Today she reports that she has not had any bleeding since our prior visit. She is otherwise feeling well without concerns or complaints.    Past Medical History:  Diagnosis Date  . Depression   . Diabetes mellitus without complication ([place])   . Diabetic peripheral neuropathy ([place]) 03/07/15  . Diabetic peripheral neuropathy ([place]) 03/07/15  . Diabetic peripheral neuropathy ([place]) 03/07/15  . GERD (gastroesophageal reflux disease)   . Hypercholesterolemia 03/07/15  . Hypertension   . Obesity 03/07/15  . Personality disorder ([place]) 03/07/15  . Sinus tachycardia 03/07/15   history of  . Suicidal thoughts 03/07/15   Family History  Problem Relation Age of Onset  . Hypertension Father   . Diabetes Mother    Past Surgical History:  Procedure Laterality Date  . COLONOSCOPY WITH PROPOFOL N/A 12/01/2018   Procedure: COLONOSCOPY WITH PROPOFOL;  Surgeon: Jonathon Bellows, MD;  Location: [facility] ENDOSCOPY;  Service: Gastroenterology;  Laterality: N/A;  . electroconvulsion therapy  03/07/15    Short Social History:  Social History   Tobacco Use  . Smoking status: Never Smoker  . Smokeless tobacco: Never Used  Substance Use Topics  . Alcohol use: No    Allergies  Allergen Reactions  . Prednisone     Increases blood sugar    Current Outpatient Medications  Medication Sig Dispense Refill  . atorvastatin (LIPITOR) 10 MG tablet TAKE 1 TABLET AT BEDTIME. 90 tablet 1  . buPROPion (WELLBUTRIN XL) 300 MG 24 hr tablet Take 1 tablet (300 mg total) by mouth daily. 90 tablet 1  . escitalopram (LEXAPRO) 20 MG tablet Take 1 tablet (20 mg total) by mouth daily. 90 tablet 1  . glucose  blood (ACCU-CHEK AVIVA PLUS) test strip Use as instructed 100 each 12  . Lancets Misc. (ACCU-CHEK FASTCLIX LANCET) KIT     . lisinopril (ZESTRIL) 10 MG tablet TAKE 1 TABLET (10 MG TOTAL) BY MOUTH DAILY. 90 tablet 0  . metFORMIN (GLUCOPHAGE-XR) 500 MG 24 hr tablet TAKE 1 TABLET (500 MG TOTAL) BY MOUTH DAILY WITH BREAKFAST FOR 14 DAYS, THEN 1 TABLET (500 MG TOTAL) TWO TIMES DAILY. 194 tablet 0  . ziprasidone (GEODON) 80 MG capsule TAKE 1 CAPSULE TWO TIMES DAILY WITH A MEAL. 180 capsule 1   No current facility-administered medications for this visit.     Review of Systems  Constitutional: Negative for chills, fatigue, fever and unexpected weight change.  HENT: Negative for trouble swallowing.  Eyes: Negative for loss of vision.  Respiratory: Negative for cough, shortness of breath and wheezing.  Cardiovascular: Negative for chest pain, leg swelling, palpitations and syncope.  GI: Negative for abdominal pain, blood in stool, diarrhea, nausea and vomiting.  GU: Negative for difficulty urinating, dysuria, frequency and hematuria.  Musculoskeletal: Negative for back pain, leg pain and joint pain.  Skin: Negative for rash.  Neurological: Negative for dizziness, headaches, light-headedness, numbness and seizures.  Psychiatric: Negative for behavioral problem, confusion, depressed mood and sleep disturbance.        Objective:  Objective   Vitals:   07/05/19 1007  BP: 120/70  Weight: 246 lb (111.6 kg)  Height: 5' 3.5" (1.613 m)   Body mass index is 42.89 kg/m.  Physical Exam Vitals signs and nursing note reviewed.  Constitutional:      Appearance: She is well-developed.  HENT:     Head: Normocephalic and atraumatic.  Eyes:     Pupils: Pupils are equal, round, and reactive to light.  Cardiovascular:     Rate and Rhythm: Normal rate and regular rhythm.  Pulmonary:     Effort: Pulmonary effort is normal. No respiratory distress.  Abdominal:     General: Abdomen is flat.      Palpations: Abdomen is soft.  Skin:    General: Skin is warm and dry.  Neurological:     Mental Status: She is alert and oriented to person, place, and time.  Psychiatric:        Behavior: Behavior normal.        Thought Content: Thought content normal.        Judgment: Judgment normal.        Assessment/Plan:     48 yo with postmenopausal bleeding Endometrium today is thin suggesting atrophy instead of hyperplasia, malignancy or polyp as a possible cause of bleeding.  Because of this will defer biopsy Patient recommended to return if bleeding occurs again in the future is heavy or recurrent. In these circumstances a biopsy would be warranted.   More than 25 minutes were spent face to face with the patient in the room with more than 50% of the time spent providing counseling and discussing the plan of management.      Adrian Prows MD [facility] 07/05/2019 10:41 AM

## 2019-07-07 ENCOUNTER — Telehealth: Payer: Self-pay

## 2019-07-08 ENCOUNTER — Other Ambulatory Visit: Payer: Self-pay | Admitting: Psychiatry

## 2019-07-09 ENCOUNTER — Encounter (HOSPITAL_BASED_OUTPATIENT_CLINIC_OR_DEPARTMENT_OTHER)
Admission: RE | Admit: 2019-07-09 | Discharge: 2019-07-09 | Disposition: A | Discharge status: Home / Self Care | Payer: Medicare PPO | Source: Ambulatory Visit | Attending: Psychiatry | Admitting: Psychiatry

## 2019-07-09 ENCOUNTER — Ambulatory Visit: Payer: Self-pay | Admitting: Anesthesiology

## 2019-07-09 ENCOUNTER — Other Ambulatory Visit
Admission: RE | Admit: 2019-07-09 | Discharge: 2019-07-09 | Disposition: A | Discharge status: Home / Self Care | Payer: Medicare PPO | Source: Ambulatory Visit | Attending: Psychiatry | Admitting: Psychiatry

## 2019-07-09 ENCOUNTER — Other Ambulatory Visit: Payer: Self-pay

## 2019-07-09 ENCOUNTER — Encounter: Payer: Self-pay | Admitting: Anesthesiology

## 2019-07-09 DIAGNOSIS — F332 Major depressive disorder, recurrent severe without psychotic features: Secondary | ICD-10-CM | POA: Diagnosis not present

## 2019-07-09 DIAGNOSIS — R45851 Suicidal ideations: Secondary | ICD-10-CM | POA: Diagnosis not present

## 2019-07-09 DIAGNOSIS — K219 Gastro-esophageal reflux disease without esophagitis: Secondary | ICD-10-CM | POA: Diagnosis not present

## 2019-07-09 DIAGNOSIS — Z888 Allergy status to other drugs, medicaments and biological substances status: Secondary | ICD-10-CM | POA: Diagnosis not present

## 2019-07-09 DIAGNOSIS — Z20828 Contact with and (suspected) exposure to other viral communicable diseases: Secondary | ICD-10-CM | POA: Diagnosis not present

## 2019-07-09 DIAGNOSIS — E1142 Type 2 diabetes mellitus with diabetic polyneuropathy: Secondary | ICD-10-CM | POA: Diagnosis not present

## 2019-07-09 DIAGNOSIS — F329 Major depressive disorder, single episode, unspecified: Secondary | ICD-10-CM | POA: Diagnosis not present

## 2019-07-09 DIAGNOSIS — I10 Essential (primary) hypertension: Secondary | ICD-10-CM | POA: Diagnosis not present

## 2019-07-09 DIAGNOSIS — Z01812 Encounter for preprocedural laboratory examination: Secondary | ICD-10-CM | POA: Insufficient documentation

## 2019-07-09 LAB — SARS CORONAVIRUS 2 BY RT PCR (HOSPITAL ORDER, PERFORMED IN ~~LOC~~ HOSPITAL LAB): SARS Coronavirus 2: NEGATIVE

## 2019-07-09 LAB — GLUCOSE, CAPILLARY: Glucose-Capillary: 108 mg/dL — ABNORMAL HIGH (ref 70–99)

## 2019-07-09 MED ORDER — SUCCINYLCHOLINE CHLORIDE 200 MG/10ML IV SOSY
PREFILLED_SYRINGE | INTRAVENOUS | Status: DC | PRN
  Administered 2019-07-09: 100 mg via INTRAVENOUS

## 2019-07-09 MED ORDER — KETOROLAC TROMETHAMINE 30 MG/ML IJ SOLN
INTRAMUSCULAR | Status: AC
  Administered 2019-07-09: 30 mg via INTRAVENOUS
  Filled 2019-07-09: qty 1

## 2019-07-09 MED ORDER — SODIUM CHLORIDE 0.9 % IV SOLN
INTRAVENOUS | Status: DC | PRN
  Administered 2019-07-09: 09:00:00 via INTRAVENOUS

## 2019-07-09 MED ORDER — ESMOLOL HCL 100 MG/10ML IV SOLN
INTRAVENOUS | Status: AC
  Filled 2019-07-09: qty 10

## 2019-07-09 MED ORDER — LABETALOL HCL 5 MG/ML IV SOLN
INTRAVENOUS | Status: DC | PRN
  Administered 2019-07-09: 30 mg via INTRAVENOUS

## 2019-07-09 MED ORDER — SODIUM CHLORIDE 0.9 % IV SOLN
500.0000 mL | Freq: Once | INTRAVENOUS | Status: AC
  Administered 2019-07-09: 500 mL via INTRAVENOUS

## 2019-07-09 MED ORDER — LABETALOL HCL 5 MG/ML IV SOLN
INTRAVENOUS | Status: AC
  Filled 2019-07-09: qty 4

## 2019-07-09 MED ORDER — GLYCOPYRROLATE 0.2 MG/ML IJ SOLN
INTRAMUSCULAR | Status: AC
  Administered 2019-07-09: 0.4 mg via INTRAVENOUS
  Filled 2019-07-09: qty 2

## 2019-07-09 MED ORDER — METHOHEXITAL SODIUM 100 MG/10ML IV SOSY
PREFILLED_SYRINGE | INTRAVENOUS | Status: DC | PRN
  Administered 2019-07-09: 70 mg via INTRAVENOUS

## 2019-07-09 MED ORDER — SUCCINYLCHOLINE CHLORIDE 20 MG/ML IJ SOLN
INTRAMUSCULAR | Status: AC
  Filled 2019-07-09: qty 1

## 2019-07-09 MED ORDER — GLYCOPYRROLATE 0.2 MG/ML IJ SOLN
0.4000 mg | Freq: Once | INTRAMUSCULAR | Status: AC
  Administered 2019-07-09: 10:00:00 0.4 mg via INTRAVENOUS

## 2019-07-09 MED ORDER — KETOROLAC TROMETHAMINE 30 MG/ML IJ SOLN
30.0000 mg | Freq: Once | INTRAMUSCULAR | Status: AC
  Administered 2019-07-09: 10:00:00 30 mg via INTRAVENOUS

## 2019-07-09 MED ORDER — ESMOLOL HCL 100 MG/10ML IV SOLN
INTRAVENOUS | Status: DC | PRN
  Administered 2019-07-09: 20 mg via INTRAVENOUS

## 2019-07-09 NOTE — Anesthesia Postprocedure Evaluation (Signed)
Anesthesia Post Note  Patient: Melinda [name]  Procedure(s) Performed: ECT TX  Patient location during evaluation: PACU Anesthesia Type: General Level of consciousness: awake and alert Pain management: pain level controlled Vital Signs Assessment: post-procedure vital signs reviewed and stable Respiratory status: spontaneous breathing, nonlabored ventilation and respiratory function stable Cardiovascular status: blood pressure returned to baseline and stable Postop Assessment: no signs of nausea or vomiting Anesthetic complications: no     Last Vitals:  Vitals:   07/09/19 1132 07/09/19 1135  BP: (!) 135/98 132/89  Pulse: 97 98  Resp: 15 18  Temp: 37.1 C   SpO2: 96%     Last Pain:  Vitals:   07/09/19 1135  TempSrc:   PainSc: 0-No pain                 Letetia Romanello

## 2019-07-09 NOTE — Transfer of Care (Signed)
Immediate Anesthesia Transfer of Care Note  Patient: Melinda [name]  Procedure(s) Performed: ECT TX  Patient Location: PACU  Anesthesia Type:General  Level of Consciousness: sedated  Airway & Oxygen Therapy: Patient Spontanous Breathing and Patient connected to face mask oxygen  Post-op Assessment: Report given to RN and Post -op Vital signs reviewed and stable  Post vital signs: Reviewed and stable  Last Vitals:  Vitals Value Taken Time  BP 169/97 07/09/19 1112  Temp    Pulse 103 07/09/19 1111  Resp 18 07/09/19 1111  SpO2 99 % 07/09/19 1111  Vitals shown include unvalidated device data.  Last Pain:  Vitals:   07/09/19 0934  TempSrc:   PainSc: 0-No pain         Complications: No apparent anesthesia complications

## 2019-07-09 NOTE — Discharge Instructions (Signed)
1)  The drugs that you have been given will stay in your system until tomorrow so for the       next 24 hours you should not:  A. Drive an automobile  B. Make any legal decisions  C. Drink any alcoholic beverages  2)  You may resume your regular meals upon return home.  3)  A responsible adult must take you home.  Someone should stay with you for a few          hours, then be available by phone for the remainder of the treatment day.  4)  You May experience any of the following symptoms:  Headache, Nausea and a dry mouth (due to the medications you were given),  temporary memory loss and some confusion, or sore muscles (a warm bath  should help this).  If you you experience any of these symptoms let us know on                your return visit.  5)  Report any of the following: any acute discomfort, severe headache, or temperature        greater than 100.5 F.   Also report any unusual redness, swelling, drainage, or pain         at your IV site.    You may report Symptoms to:  [place] at [facility]          Phone: [phone], ECT Department           or Dr. Prescott Gum office [phone]  6)  Your next ECT Treatment is Friday September 11 at 8:00 to receive a COVID test  We will call 2 days prior to your scheduled appointment for arrival times.  7)  Nothing to eat or drink after midnight the night before your procedure.  8)  Take      With a sip of water the morning of your procedure.  9)  Other Instructions: Call [phone] to cancel the morning of your procedure due         to illness or emergency.  10) We will call within 72 hours to assess how you are feeling.

## 2019-07-09 NOTE — Anesthesia Post-op Follow-up Note (Signed)
Anesthesia QCDR form completed.        

## 2019-07-09 NOTE — Procedures (Signed)
ECT SERVICES Physician's Interval Evaluation & Treatment Note  Patient Identification: Melinda [name] MRN:  [id number] Date of Evaluation:  07/09/2019 TX #: [id number]  MADRS:   MMSE:   P.E. Findings:  No change physical exam  Psychiatric Interval Note:  Chronic depression  Subjective:  Patient is a 48 y.o. female seen for evaluation for Electroconvulsive Therapy. Not suicidal not self injury and chronic dysphoria and anxiety  Treatment Summary:   []   Right Unilateral             [x]  Bilateral   % Energy : 1.0 ms 35%   Impedance: 810 ohms  Seizure Energy Index: 6128 V squared  Postictal Suppression Index: 54%  Seizure Concordance Index: 96%  Medications  Pre Shock: Robinul 0.4 mg Toradol 30 mg esmolol 20 mg labetalol 20 mg Brevital 70 mg succinylcholine 100 mg  Post Shock:    Seizure Duration: 32 seconds EMG 79 seconds EEG   Comments: Follow-up 2 weeks  Lungs:  [x]   Clear to auscultation               []  Other:   Heart:    [x]   Regular rhythm             []  irregular rhythm    [x]   Previous H&P reviewed, patient examined and there are NO CHANGES                 []   Previous H&P reviewed, patient examined and there are changes noted.   [name], MD 8/28/202011:05 AM

## 2019-07-09 NOTE — H&P (Signed)
Melinda [name] is an 48 y.o. female.   Chief Complaint: chronic dysthymia History of depression with improvement from ECT  HPI: history of depression  Past Medical History:  Diagnosis Date  . Depression   . Diabetes mellitus without complication ([place])   . Diabetic peripheral neuropathy ([place]) 03/07/15  . Diabetic peripheral neuropathy ([place]) 03/07/15  . Diabetic peripheral neuropathy ([place]) 03/07/15  . GERD (gastroesophageal reflux disease)   . Hypercholesterolemia 03/07/15  . Hypertension   . Obesity 03/07/15  . Personality disorder ([place]) 03/07/15  . Sinus tachycardia 03/07/15   history of  . Suicidal thoughts 03/07/15    Past Surgical History:  Procedure Laterality Date  . COLONOSCOPY WITH PROPOFOL N/A 12/01/2018   Procedure: COLONOSCOPY WITH PROPOFOL;  Surgeon: Jonathon Bellows, MD;  Location: [facility] ENDOSCOPY;  Service: Gastroenterology;  Laterality: N/A;  . electroconvulsion therapy  03/07/15    Family History  Problem Relation Age of Onset  . Hypertension Father   . Diabetes Mother    Social History:  reports that she has never smoked. She has never used smokeless tobacco. She reports that she does not drink alcohol or use drugs.  Allergies:  Allergies  Allergen Reactions  . Prednisone     Increases blood sugar    (Not in a hospital admission)   Results for orders placed or performed during the hospital encounter of 07/09/19 (from the past 48 hour(s))  Glucose, capillary     Status: Abnormal   Collection Time: 07/09/19  9:12 AM  Result Value Ref Range   Glucose-Capillary 108 (H) 70 - 99 mg/dL   No results found.  Review of Systems  Constitutional: Negative.   HENT: Negative.   Eyes: Negative.   Respiratory: Negative.   Cardiovascular: Negative.   Gastrointestinal: Negative.   Musculoskeletal: Negative.   Skin: Negative.   Neurological: Negative.   Psychiatric/Behavioral: Negative.     Blood pressure (!) 152/90, pulse 95, temperature 98.2 F (36.8 C), temperature  source Oral, resp. rate 18, SpO2 98 %. Physical Exam  Nursing note and vitals reviewed. Constitutional: She appears well-developed and well-nourished.  HENT:  Head: Normocephalic and atraumatic.  Eyes: Pupils are equal, round, and reactive to light. Conjunctivae are normal.  Neck: Normal range of motion.  Cardiovascular: Regular rhythm and normal heart sounds.  Respiratory: Effort normal.  GI: Soft.  Musculoskeletal: Normal range of motion.  Neurological: She is alert.  Skin: Skin is warm and dry.  Psychiatric: She has a normal mood and affect. Her behavior is normal. Judgment and thought content normal.     Assessment/Plan Bilateral ect as usual follow up 2 weeks  [name], MD 07/09/2019, 9:52 AM

## 2019-07-09 NOTE — Anesthesia Preprocedure Evaluation (Signed)
Anesthesia Evaluation  Patient identified by MRN, date of birth, ID band Patient awake    Reviewed: Allergy & Precautions, H&P , NPO status , Patient's Chart, lab work & pertinent test results  History of Anesthesia Complications Negative for: history of anesthetic complications  Airway Mallampati: II  TM Distance: >3 FB Neck ROM: full    Dental  (+) Poor Dentition, Chipped   Pulmonary sleep apnea , neg COPD,    Pulmonary exam normal breath sounds clear to auscultation       Cardiovascular hypertension, Pt. on medications (-) CAD and (-) Past MI negative cardio ROS Normal cardiovascular exam Rhythm:regular Rate:Normal     Neuro/Psych PSYCHIATRIC DISORDERS Depression Bipolar Disorder  Neuromuscular disease negative neurological ROS     GI/Hepatic negative GI ROS, Neg liver ROS, GERD  Medicated,  Endo/Other  diabetes, Type 2, Oral Hypoglycemic Agents  Renal/GU negative Renal ROS  negative genitourinary   Musculoskeletal   Abdominal (+) + obese,   Peds  Hematology negative hematology ROS (+)   Anesthesia Other Findings Past Medical History: No date: Depression No date: Diabetes mellitus without complication (HCC) 2/56/38: Diabetic peripheral neuropathy ([place]) 03/07/15: Diabetic peripheral neuropathy ([place]) 03/07/15: Diabetic peripheral neuropathy (HCC) No date: GERD (gastroesophageal reflux disease) 03/07/15: Hypercholesterolemia No date: Hypertension 03/07/15: Obesity 03/07/15: Personality disorder 03/07/15: Sinus tachycardia ([place])     Comment: history of 03/07/15: Suicidal thoughts Past Surgical History: 03/07/15: electroconvulsion therapy BMI    Body Mass Index:  36.44 kg/m     Reproductive/Obstetrics                             Anesthesia Physical  Anesthesia Plan  ASA: III  Anesthesia Plan: General   Post-op Pain Management:    Induction: Intravenous  PONV Risk Score and  Plan: 2 and Ondansetron  Airway Management Planned: Mask  Additional Equipment:   Intra-op Plan:   Post-operative Plan:   Informed Consent: I have reviewed the patients History and Physical, chart, labs and discussed the procedure including the risks, benefits and alternatives for the proposed anesthesia with the patient or authorized representative who has indicated his/her understanding and acceptance.     Dental Advisory Given  Plan Discussed with: CRNA and Anesthesiologist  Anesthesia Plan Comments:         Anesthesia Quick Evaluation

## 2019-07-21 ENCOUNTER — Telehealth: Payer: Self-pay

## 2019-07-22 ENCOUNTER — Other Ambulatory Visit: Payer: Self-pay | Admitting: Psychiatry

## 2019-07-23 ENCOUNTER — Other Ambulatory Visit
Admission: RE | Admit: 2019-07-23 | Discharge: 2019-07-23 | Disposition: A | Discharge status: Home / Self Care | Payer: Medicare PPO | Source: Ambulatory Visit | Attending: Psychiatry | Admitting: Psychiatry

## 2019-07-23 ENCOUNTER — Encounter
Admission: RE | Admit: 2019-07-23 | Discharge: 2019-07-23 | Disposition: A | Discharge status: Home / Self Care | Payer: Medicare PPO | Source: Ambulatory Visit | Attending: Psychiatry | Admitting: Psychiatry

## 2019-07-23 ENCOUNTER — Encounter: Payer: Self-pay | Admitting: Anesthesiology

## 2019-07-23 ENCOUNTER — Other Ambulatory Visit: Payer: Self-pay

## 2019-07-23 DIAGNOSIS — Z7984 Long term (current) use of oral hypoglycemic drugs: Secondary | ICD-10-CM | POA: Insufficient documentation

## 2019-07-23 DIAGNOSIS — E7800 Pure hypercholesterolemia, unspecified: Secondary | ICD-10-CM | POA: Insufficient documentation

## 2019-07-23 DIAGNOSIS — Z6841 Body Mass Index (BMI) 40.0 and over, adult: Secondary | ICD-10-CM | POA: Diagnosis not present

## 2019-07-23 DIAGNOSIS — K219 Gastro-esophageal reflux disease without esophagitis: Secondary | ICD-10-CM | POA: Diagnosis not present

## 2019-07-23 DIAGNOSIS — Z79899 Other long term (current) drug therapy: Secondary | ICD-10-CM | POA: Diagnosis not present

## 2019-07-23 DIAGNOSIS — I10 Essential (primary) hypertension: Secondary | ICD-10-CM | POA: Diagnosis not present

## 2019-07-23 DIAGNOSIS — Z8249 Family history of ischemic heart disease and other diseases of the circulatory system: Secondary | ICD-10-CM | POA: Insufficient documentation

## 2019-07-23 DIAGNOSIS — Z20828 Contact with and (suspected) exposure to other viral communicable diseases: Secondary | ICD-10-CM | POA: Insufficient documentation

## 2019-07-23 DIAGNOSIS — Z888 Allergy status to other drugs, medicaments and biological substances status: Secondary | ICD-10-CM | POA: Diagnosis not present

## 2019-07-23 DIAGNOSIS — E1140 Type 2 diabetes mellitus with diabetic neuropathy, unspecified: Secondary | ICD-10-CM | POA: Diagnosis not present

## 2019-07-23 DIAGNOSIS — Z833 Family history of diabetes mellitus: Secondary | ICD-10-CM | POA: Insufficient documentation

## 2019-07-23 DIAGNOSIS — F329 Major depressive disorder, single episode, unspecified: Secondary | ICD-10-CM | POA: Insufficient documentation

## 2019-07-23 DIAGNOSIS — R45851 Suicidal ideations: Secondary | ICD-10-CM | POA: Diagnosis not present

## 2019-07-23 DIAGNOSIS — F332 Major depressive disorder, recurrent severe without psychotic features: Secondary | ICD-10-CM | POA: Diagnosis not present

## 2019-07-23 DIAGNOSIS — Z01812 Encounter for preprocedural laboratory examination: Secondary | ICD-10-CM | POA: Diagnosis not present

## 2019-07-23 DIAGNOSIS — E669 Obesity, unspecified: Secondary | ICD-10-CM | POA: Insufficient documentation

## 2019-07-23 DIAGNOSIS — E1142 Type 2 diabetes mellitus with diabetic polyneuropathy: Secondary | ICD-10-CM | POA: Diagnosis not present

## 2019-07-23 LAB — GLUCOSE, CAPILLARY: Glucose-Capillary: 116 mg/dL — ABNORMAL HIGH (ref 70–99)

## 2019-07-23 LAB — SARS CORONAVIRUS 2 BY RT PCR (HOSPITAL ORDER, PERFORMED IN ~~LOC~~ HOSPITAL LAB): SARS Coronavirus 2: NEGATIVE

## 2019-07-23 MED ORDER — METHOHEXITAL SODIUM 100 MG/10ML IV SOSY
PREFILLED_SYRINGE | INTRAVENOUS | Status: DC | PRN
  Administered 2019-07-23: 70 mg via INTRAVENOUS

## 2019-07-23 MED ORDER — SODIUM CHLORIDE 0.9 % IV SOLN
500.0000 mL | Freq: Once | INTRAVENOUS | Status: AC
  Administered 2019-07-23: 09:00:00 via INTRAVENOUS

## 2019-07-23 MED ORDER — KETOROLAC TROMETHAMINE 30 MG/ML IJ SOLN
INTRAMUSCULAR | Status: AC
  Filled 2019-07-23: qty 1

## 2019-07-23 MED ORDER — SUCCINYLCHOLINE CHLORIDE 20 MG/ML IJ SOLN
INTRAMUSCULAR | Status: AC
  Filled 2019-07-23: qty 1

## 2019-07-23 MED ORDER — KETOROLAC TROMETHAMINE 30 MG/ML IJ SOLN: 30.0000 mg | Freq: Once | INTRAMUSCULAR | Status: DC

## 2019-07-23 MED ORDER — SUCCINYLCHOLINE CHLORIDE 200 MG/10ML IV SOSY
PREFILLED_SYRINGE | INTRAVENOUS | Status: DC | PRN
  Administered 2019-07-23: 100 mg via INTRAVENOUS

## 2019-07-23 MED ORDER — ESMOLOL HCL 100 MG/10ML IV SOLN
INTRAVENOUS | Status: DC | PRN
  Administered 2019-07-23: 20 mg via INTRAVENOUS

## 2019-07-23 MED ORDER — LABETALOL HCL 5 MG/ML IV SOLN
INTRAVENOUS | Status: DC | PRN
  Administered 2019-07-23: 30 mg via INTRAVENOUS

## 2019-07-23 MED ORDER — GLYCOPYRROLATE 0.2 MG/ML IJ SOLN
INTRAMUSCULAR | Status: AC
  Filled 2019-07-23: qty 2

## 2019-07-23 MED ORDER — LABETALOL HCL 5 MG/ML IV SOLN
INTRAVENOUS | Status: AC
  Filled 2019-07-23: qty 4

## 2019-07-23 MED ORDER — ESMOLOL HCL 100 MG/10ML IV SOLN
INTRAVENOUS | Status: AC
  Filled 2019-07-23: qty 10

## 2019-07-23 MED ORDER — METHOHEXITAL SODIUM 0.5 G IJ SOLR
INTRAMUSCULAR | Status: AC
  Filled 2019-07-23: qty 500

## 2019-07-23 MED ORDER — GLYCOPYRROLATE 0.2 MG/ML IJ SOLN: 0.4000 mg | Freq: Once | INTRAMUSCULAR | Status: DC

## 2019-07-23 NOTE — Anesthesia Preprocedure Evaluation (Signed)
Anesthesia Evaluation  Patient identified by MRN, date of birth, ID band Patient awake    Reviewed: Allergy & Precautions, H&P , NPO status , Patient's Chart, lab work & pertinent test results  History of Anesthesia Complications Negative for: history of anesthetic complications  Airway Mallampati: II  TM Distance: >3 FB Neck ROM: full    Dental  (+) Chipped   Pulmonary sleep apnea , neg COPD, neg recent URI,           Cardiovascular hypertension, (-) angina     Neuro/Psych PSYCHIATRIC DISORDERS Depression Bipolar Disorder negative neurological ROS     GI/Hepatic Neg liver ROS, GERD  Controlled,  Endo/Other  diabetesMorbid obesity  Renal/GU negative Renal ROS  negative genitourinary   Musculoskeletal   Abdominal   Peds  Hematology negative hematology ROS (+)   Anesthesia Other Findings Past Medical History: No date: Depression No date: Diabetes mellitus without complication ([place]) 99991111: Diabetic peripheral neuropathy ([place]) 03/07/15: Diabetic peripheral neuropathy ([place]) 03/07/15: Diabetic peripheral neuropathy ([facility]) No date: GERD (gastroesophageal reflux disease) 03/07/15: Hypercholesterolemia No date: Hypertension 03/07/15: Obesity 03/07/15: Personality disorder ([place]) 03/07/15: Sinus tachycardia     Comment:  history of 03/07/15: Suicidal thoughts  Past Surgical History: 12/01/2018: COLONOSCOPY WITH PROPOFOL; N/A     Comment:  Procedure: COLONOSCOPY WITH PROPOFOL;  Surgeon: Jonathon Bellows, MD;  Location: [facility] ENDOSCOPY;  Service:               Gastroenterology;  Laterality: N/A; 03/07/15: electroconvulsion therapy  BMI    Body Mass Index: 44.52 kg/m      Reproductive/Obstetrics negative OB ROS                             Anesthesia Physical  Anesthesia Plan  ASA: III  Anesthesia Plan: General   Post-op Pain Management:    Induction:   PONV Risk Score and  Plan:   Airway Management Planned: Mask  Additional Equipment:   Intra-op Plan:   Post-operative Plan:   Informed Consent: I have reviewed the patients History and Physical, chart, labs and discussed the procedure including the risks, benefits and alternatives for the proposed anesthesia with the patient or authorized representative who has indicated his/her understanding and acceptance.     Dental Advisory Given  Plan Discussed with: Anesthesiologist and CRNA  Anesthesia Plan Comments:         Anesthesia Quick Evaluation

## 2019-07-23 NOTE — Anesthesia Procedure Notes (Signed)
Performed by: Cary Wilford, CRNA Pre-anesthesia Checklist: Patient identified, Emergency Drugs available, Suction available and Patient being monitored Patient Re-evaluated:Patient Re-evaluated prior to induction Oxygen Delivery Method: Circle system utilized Preoxygenation: Pre-oxygenation with 100% oxygen Induction Type: IV induction Ventilation: Mask ventilation without difficulty and Mask ventilation throughout procedure Airway Equipment and Method: Bite block Placement Confirmation: positive ETCO2 Dental Injury: Teeth and Oropharynx as per pre-operative assessment        

## 2019-07-23 NOTE — Anesthesia Postprocedure Evaluation (Signed)
Anesthesia Post Note  Patient: Melinda [name]  Procedure(s) Performed: ECT TX  Patient location during evaluation: PACU Anesthesia Type: General Level of consciousness: awake and alert Pain management: pain level controlled Vital Signs Assessment: post-procedure vital signs reviewed and stable Respiratory status: spontaneous breathing, nonlabored ventilation and respiratory function stable Cardiovascular status: blood pressure returned to baseline and stable Postop Assessment: no apparent nausea or vomiting Anesthetic complications: no     Last Vitals:  Vitals:   07/23/19 1137 07/23/19 1219  BP: 110/64 128/87  Pulse: (!) 101 100  Resp: (!) 26 18  Temp:  36.9 C  SpO2: 97%     Last Pain:  Vitals:   07/23/19 1219  TempSrc: Oral  PainSc: 0-No pain                 Durenda Hurt

## 2019-07-23 NOTE — Anesthesia Post-op Follow-up Note (Signed)
Anesthesia QCDR form completed.        

## 2019-07-23 NOTE — H&P (Signed)
Melinda [name] is an 48 y.o. female.   Chief Complaint: No specific new complaint HPI: History of recurrent severe depression  Past Medical History:  Diagnosis Date  . Depression   . Diabetes mellitus without complication ([place])   . Diabetic peripheral neuropathy ([place]) 03/07/15  . Diabetic peripheral neuropathy ([place]) 03/07/15  . Diabetic peripheral neuropathy ([place]) 03/07/15  . GERD (gastroesophageal reflux disease)   . Hypercholesterolemia 03/07/15  . Hypertension   . Obesity 03/07/15  . Personality disorder ([place]) 03/07/15  . Sinus tachycardia 03/07/15   history of  . Suicidal thoughts 03/07/15    Past Surgical History:  Procedure Laterality Date  . COLONOSCOPY WITH PROPOFOL N/A 12/01/2018   Procedure: COLONOSCOPY WITH PROPOFOL;  Surgeon: Jonathon Bellows, MD;  Location: [facility] ENDOSCOPY;  Service: Gastroenterology;  Laterality: N/A;  . electroconvulsion therapy  03/07/15    Family History  Problem Relation Age of Onset  . Hypertension Father   . Diabetes Mother    Social History:  reports that she has never smoked. She has never used smokeless tobacco. She reports that she does not drink alcohol or use drugs.  Allergies:  Allergies  Allergen Reactions  . Prednisone     Increases blood sugar    (Not in a hospital admission)   Results for orders placed or performed during the hospital encounter of 07/23/19 (from the past 48 hour(s))  Glucose, capillary     Status: Abnormal   Collection Time: 07/23/19  9:55 AM  Result Value Ref Range   Glucose-Capillary 116 (H) 70 - 99 mg/dL   No results found.  Review of Systems  Constitutional: Negative.   HENT: Negative.   Eyes: Negative.   Respiratory: Negative.   Cardiovascular: Negative.   Gastrointestinal: Negative.   Musculoskeletal: Negative.   Skin: Negative.   Neurological: Negative.   Psychiatric/Behavioral: Negative.     Blood pressure 139/83, pulse 100, temperature 98.1 F (36.7 C), temperature source Oral, resp. rate  18, SpO2 98 %. Physical Exam  Nursing note and vitals reviewed. Constitutional: She appears well-developed and well-nourished.  HENT:  Head: Normocephalic and atraumatic.  Eyes: Pupils are equal, round, and reactive to light. Conjunctivae are normal.  Neck: Normal range of motion.  Cardiovascular: Regular rhythm and normal heart sounds.  Respiratory: Effort normal.  GI: Soft.  Musculoskeletal: Normal range of motion.  Neurological: She is alert.  Skin: Skin is warm and dry.  Psychiatric: She has a normal mood and affect. Her behavior is normal. Judgment and thought content normal.     Assessment/Plan Treatment today and follow-up 2 weeks  [name], MD 07/23/2019, 11:00 AM

## 2019-07-23 NOTE — Procedures (Signed)
ECT SERVICES Physician's Interval Evaluation & Treatment Note  Patient Identification: Melinda [name] MRN:  [id number] Date of Evaluation:  07/23/2019 TX #: [id number]  MADRS:   MMSE:   P.E. Findings:  No change to physical exam  Psychiatric Interval Note:  Mood stable  Subjective:  Patient is a 48 y.o. female seen for evaluation for Electroconvulsive Therapy. No new complaint  Treatment Summary:   []   Right Unilateral             [x]  Bilateral   % Energy : 1.0 ms 35%   Impedance: 1320 ohms  Seizure Energy Index: 11,502 V squared  Postictal Suppression Index: 86%  Seizure Concordance Index: 96%  Medications  Pre Shock: Robinul 0.4 mg labetalol 20 mg esmolol 10 mg Toradol 30 mg Brevital 70 mg succinylcholine 100 mg  Post Shock:    Seizure Duration: 31 seconds EMG 73 seconds EEG   Comments: Follow-up 2 weeks  Lungs:  [x]   Clear to auscultation               []  Other:   Heart:    [x]   Regular rhythm             []  irregular rhythm    [x]   Previous H&P reviewed, patient examined and there are NO CHANGES                 []   Previous H&P reviewed, patient examined and there are changes noted.   [name], MD 9/11/202011:02 AM

## 2019-07-23 NOTE — Transfer of Care (Signed)
Immediate Anesthesia Transfer of Care Note  Patient: Melinda [name]  Procedure(s) Performed: ECT TX  Patient Location: PACU  Anesthesia Type:General  Level of Consciousness: sedated  Airway & Oxygen Therapy: Patient Spontanous Breathing and Patient connected to face mask oxygen  Post-op Assessment: Report given to RN and Post -op Vital signs reviewed and stable  Post vital signs: Reviewed and stable  Last Vitals:  Vitals Value Taken Time  BP    Temp    Pulse 101 07/23/19 1125  Resp 26 07/23/19 1125  SpO2 100 % 07/23/19 1125  Vitals shown include unvalidated device data.  Last Pain:  Vitals:   07/23/19 0929  TempSrc:   PainSc: 0-No pain         Complications: No apparent anesthesia complications

## 2019-08-04 ENCOUNTER — Telehealth: Payer: Self-pay

## 2019-08-05 ENCOUNTER — Other Ambulatory Visit: Payer: Self-pay | Admitting: Psychiatry

## 2019-08-06 ENCOUNTER — Other Ambulatory Visit
Admission: RE | Admit: 2019-08-06 | Discharge: 2019-08-06 | Disposition: A | Discharge status: Home / Self Care | Payer: Medicare PPO | Source: Ambulatory Visit | Attending: Psychiatry | Admitting: Psychiatry

## 2019-08-06 ENCOUNTER — Encounter: Payer: Self-pay | Admitting: Anesthesiology

## 2019-08-06 ENCOUNTER — Encounter (HOSPITAL_BASED_OUTPATIENT_CLINIC_OR_DEPARTMENT_OTHER)
Admission: RE | Admit: 2019-08-06 | Discharge: 2019-08-06 | Disposition: A | Discharge status: Home / Self Care | Payer: Medicare PPO | Source: Ambulatory Visit | Attending: Psychiatry | Admitting: Psychiatry

## 2019-08-06 ENCOUNTER — Other Ambulatory Visit: Payer: Self-pay

## 2019-08-06 DIAGNOSIS — Z6841 Body Mass Index (BMI) 40.0 and over, adult: Secondary | ICD-10-CM | POA: Diagnosis not present

## 2019-08-06 DIAGNOSIS — Z888 Allergy status to other drugs, medicaments and biological substances status: Secondary | ICD-10-CM | POA: Diagnosis not present

## 2019-08-06 DIAGNOSIS — F329 Major depressive disorder, single episode, unspecified: Secondary | ICD-10-CM | POA: Diagnosis not present

## 2019-08-06 DIAGNOSIS — E7800 Pure hypercholesterolemia, unspecified: Secondary | ICD-10-CM | POA: Diagnosis not present

## 2019-08-06 DIAGNOSIS — Z20828 Contact with and (suspected) exposure to other viral communicable diseases: Secondary | ICD-10-CM | POA: Diagnosis not present

## 2019-08-06 DIAGNOSIS — E1140 Type 2 diabetes mellitus with diabetic neuropathy, unspecified: Secondary | ICD-10-CM | POA: Diagnosis not present

## 2019-08-06 DIAGNOSIS — Z8249 Family history of ischemic heart disease and other diseases of the circulatory system: Secondary | ICD-10-CM | POA: Diagnosis not present

## 2019-08-06 DIAGNOSIS — I10 Essential (primary) hypertension: Secondary | ICD-10-CM | POA: Diagnosis not present

## 2019-08-06 DIAGNOSIS — Z01812 Encounter for preprocedural laboratory examination: Secondary | ICD-10-CM | POA: Diagnosis not present

## 2019-08-06 DIAGNOSIS — E1142 Type 2 diabetes mellitus with diabetic polyneuropathy: Secondary | ICD-10-CM | POA: Diagnosis not present

## 2019-08-06 DIAGNOSIS — F332 Major depressive disorder, recurrent severe without psychotic features: Secondary | ICD-10-CM | POA: Diagnosis not present

## 2019-08-06 DIAGNOSIS — E669 Obesity, unspecified: Secondary | ICD-10-CM | POA: Diagnosis not present

## 2019-08-06 DIAGNOSIS — R45851 Suicidal ideations: Secondary | ICD-10-CM | POA: Diagnosis not present

## 2019-08-06 DIAGNOSIS — Z833 Family history of diabetes mellitus: Secondary | ICD-10-CM | POA: Diagnosis not present

## 2019-08-06 LAB — GLUCOSE, CAPILLARY: Glucose-Capillary: 73 mg/dL (ref 70–99)

## 2019-08-06 LAB — SARS CORONAVIRUS 2 BY RT PCR (HOSPITAL ORDER, PERFORMED IN ~~LOC~~ HOSPITAL LAB): SARS Coronavirus 2: NEGATIVE

## 2019-08-06 MED ORDER — KETOROLAC TROMETHAMINE 30 MG/ML IJ SOLN
INTRAMUSCULAR | Status: AC
  Administered 2019-08-06: 30 mg via INTRAVENOUS
  Filled 2019-08-06: qty 1

## 2019-08-06 MED ORDER — LABETALOL HCL 5 MG/ML IV SOLN
INTRAVENOUS | Status: AC
  Filled 2019-08-06: qty 8

## 2019-08-06 MED ORDER — GLYCOPYRROLATE 0.2 MG/ML IJ SOLN
0.4000 mg | Freq: Once | INTRAMUSCULAR | Status: AC
  Administered 2019-08-06: 09:00:00 0.4 mg via INTRAVENOUS

## 2019-08-06 MED ORDER — SODIUM CHLORIDE 0.9 % IV SOLN
500.0000 mL | Freq: Once | INTRAVENOUS | Status: AC
  Administered 2019-08-06: 500 mL via INTRAVENOUS

## 2019-08-06 MED ORDER — GLYCOPYRROLATE 0.2 MG/ML IJ SOLN
INTRAMUSCULAR | Status: AC
  Administered 2019-08-06: 0.4 mg via INTRAVENOUS
  Filled 2019-08-06: qty 2

## 2019-08-06 MED ORDER — SUCCINYLCHOLINE CHLORIDE 200 MG/10ML IV SOSY
PREFILLED_SYRINGE | INTRAVENOUS | Status: DC | PRN
  Administered 2019-08-06: 100 mg via INTRAVENOUS

## 2019-08-06 MED ORDER — SUCCINYLCHOLINE CHLORIDE 20 MG/ML IJ SOLN
INTRAMUSCULAR | Status: AC
  Filled 2019-08-06: qty 1

## 2019-08-06 MED ORDER — ESMOLOL HCL 100 MG/10ML IV SOLN
INTRAVENOUS | Status: DC | PRN
  Administered 2019-08-06: 20 mg via INTRAVENOUS

## 2019-08-06 MED ORDER — KETOROLAC TROMETHAMINE 30 MG/ML IJ SOLN
30.0000 mg | Freq: Once | INTRAMUSCULAR | Status: AC
  Administered 2019-08-06: 09:00:00 30 mg via INTRAVENOUS

## 2019-08-06 MED ORDER — FENTANYL CITRATE (PF) 100 MCG/2ML IJ SOLN: 25.0000 ug | INTRAMUSCULAR | Status: DC | PRN

## 2019-08-06 MED ORDER — LABETALOL HCL 5 MG/ML IV SOLN
INTRAVENOUS | Status: DC | PRN
  Administered 2019-08-06: 30 mg via INTRAVENOUS

## 2019-08-06 MED ORDER — ESMOLOL HCL 100 MG/10ML IV SOLN
INTRAVENOUS | Status: AC
  Filled 2019-08-06: qty 10

## 2019-08-06 MED ORDER — ONDANSETRON HCL 4 MG/2ML IJ SOLN: 4.0000 mg | Freq: Once | INTRAMUSCULAR | Status: DC | PRN

## 2019-08-06 MED ORDER — METHOHEXITAL SODIUM 100 MG/10ML IV SOSY
PREFILLED_SYRINGE | INTRAVENOUS | Status: DC | PRN
  Administered 2019-08-06: 70 mg via INTRAVENOUS

## 2019-08-06 MED ORDER — SODIUM CHLORIDE 0.9 % IV SOLN
INTRAVENOUS | Status: DC | PRN
  Administered 2019-08-06: 11:00:00 via INTRAVENOUS

## 2019-08-06 NOTE — Discharge Instructions (Signed)
1)  The drugs that you have been given will stay in your system until tomorrow so for the       next 24 hours you should not:  A. Drive an automobile  B. Make any legal decisions  C. Drink any alcoholic beverages  2)  You may resume your regular meals upon return home.  3)  A responsible adult must take you home.  Someone should stay with you for a few          hours, then be available by phone for the remainder of the treatment day.  4)  You May experience any of the following symptoms:  Headache, Nausea and a dry mouth (due to the medications you were given),  temporary memory loss and some confusion, or sore muscles (a warm bath  should help this).  If you you experience any of these symptoms let us know on                your return visit.  5)  Report any of the following: any acute discomfort, severe headache, or temperature        greater than 100.5 F.   Also report any unusual redness, swelling, drainage, or pain         at your IV site.    You may report Symptoms to:  [place] at [facility]          Phone: [phone], ECT Department           or Dr. Prescott Gum office [phone]  6)  Your next ECT Treatment is Friday October 9 at 8:00 to receive a COVID test prior to your ECT appointment   We will call 2 days prior to your scheduled appointment for arrival times.  7)  Nothing to eat or drink after midnight the night before your procedure.  8)  Take      With a sip of water the morning of your procedure.  9)  Other Instructions: Call [phone] to cancel the morning of your procedure due         to illness or emergency.  10) We will call within 72 hours to assess how you are feeling.

## 2019-08-06 NOTE — Anesthesia Post-op Follow-up Note (Signed)
Anesthesia QCDR form completed.        

## 2019-08-06 NOTE — Procedures (Signed)
ECT SERVICES Physician's Interval Evaluation & Treatment Note  Patient Identification: Melinda [name] MRN:  [id number] Date of Evaluation:  08/06/2019 TX #: [id number]  MADRS:   MMSE:   P.E. Findings:  No change to physical exam  Psychiatric Interval Note:  Mood is stable  Subjective:  Patient is a 48 y.o. female seen for evaluation for Electroconvulsive Therapy. No specific complaint  Treatment Summary:   []   Right Unilateral             [x]  Bilateral   % Energy : 1.0 ms 35%   Impedance: 1450 ohms  Seizure Energy Index: 3030 V squared  Postictal Suppression Index: 64%  Seizure Concordance Index: 91%  Medications  Pre Shock: Robinul 0.4 mg Toradol 30 mg esmolol 30 mg labetalol 20 mg Brevital 70 mg succinylcholine 100 mg  Post Shock:    Seizure Duration: 33 seconds EMG 69 seconds EEG   Comments: Follow-up 2 weeks  Lungs:  [x]   Clear to auscultation               []  Other:   Heart:    [x]   Regular rhythm             []  irregular rhythm    [x]   Previous H&P reviewed, patient examined and there are NO CHANGES                 []   Previous H&P reviewed, patient examined and there are changes noted.   [name], MD 9/25/202011:21 AM

## 2019-08-06 NOTE — Anesthesia Postprocedure Evaluation (Signed)
Anesthesia Post Note  Patient: Melinda [name]  Procedure(s) Performed: ECT TX  Patient location during evaluation: PACU Anesthesia Type: General Level of consciousness: awake and alert Pain management: pain level controlled Vital Signs Assessment: post-procedure vital signs reviewed and stable Respiratory status: spontaneous breathing, nonlabored ventilation, respiratory function stable and patient connected to nasal cannula oxygen Cardiovascular status: blood pressure returned to baseline and stable Postop Assessment: no apparent nausea or vomiting Anesthetic complications: no     Last Vitals:  Vitals:   08/06/19 1159 08/06/19 1211  BP:  (!) 144/92  Pulse: 93 87  Resp: 18 18  Temp: 36.9 C   SpO2: 93%     Last Pain:  Vitals:   08/06/19 1211  TempSrc:   PainSc: 0-No pain                 Javarie Crisp S

## 2019-08-06 NOTE — Anesthesia Procedure Notes (Signed)
Date/Time: 08/06/2019 11:31 AM Performed by: Dionne Bucy, CRNA Pre-anesthesia Checklist: Patient identified, Emergency Drugs available, Suction available and Patient being monitored Patient Re-evaluated:Patient Re-evaluated prior to induction Oxygen Delivery Method: Circle system utilized Preoxygenation: Pre-oxygenation with 100% oxygen Induction Type: IV induction Ventilation: Mask ventilation without difficulty and Mask ventilation throughout procedure Airway Equipment and Method: Bite block Placement Confirmation: positive ETCO2 Dental Injury: Teeth and Oropharynx as per pre-operative assessment

## 2019-08-06 NOTE — H&P (Signed)
Shalan Jerilynn Mages [place] is an 48 y.o. female.   Chief Complaint: Chronic depression mood is currently pretty stable no specific complaint HPI: History of recurrent depression but has maintained stability with ECT  Past Medical History:  Diagnosis Date  . Depression   . Diabetes mellitus without complication ([place])   . Diabetic peripheral neuropathy ([place]) 03/07/15  . Diabetic peripheral neuropathy ([place]) 03/07/15  . Diabetic peripheral neuropathy ([place]) 03/07/15  . GERD (gastroesophageal reflux disease)   . Hypercholesterolemia 03/07/15  . Hypertension   . Obesity 03/07/15  . Personality disorder ([place]) 03/07/15  . Sinus tachycardia 03/07/15   history of  . Suicidal thoughts 03/07/15    Past Surgical History:  Procedure Laterality Date  . COLONOSCOPY WITH PROPOFOL N/A 12/01/2018   Procedure: COLONOSCOPY WITH PROPOFOL;  Surgeon: Jonathon Bellows, MD;  Location: [facility] ENDOSCOPY;  Service: Gastroenterology;  Laterality: N/A;  . electroconvulsion therapy  03/07/15    Family History  Problem Relation Age of Onset  . Hypertension Father   . Diabetes Mother    Social History:  reports that she has never smoked. She has never used smokeless tobacco. She reports that she does not drink alcohol or use drugs.  Allergies:  Allergies  Allergen Reactions  . Prednisone     Increases blood sugar    (Not in a hospital admission)   Results for orders placed or performed during the hospital encounter of 08/06/19 (from the past 48 hour(s))  Glucose, capillary     Status: None   Collection Time: 08/06/19  9:35 AM  Result Value Ref Range   Glucose-Capillary 73 70 - 99 mg/dL   No results found.  Review of Systems  Constitutional: Negative.   HENT: Negative.   Eyes: Negative.   Respiratory: Negative.   Cardiovascular: Negative.   Gastrointestinal: Negative.   Musculoskeletal: Negative.   Skin: Negative.   Neurological: Negative.   Psychiatric/Behavioral: Negative.     Blood pressure (!) 161/92, pulse  96, temperature 98.9 F (37.2 C), temperature source Oral, resp. rate 18, height 5\' 4"  (1.626 m), weight 110.2 kg, SpO2 98 %. Physical Exam  Nursing note and vitals reviewed. Constitutional: She appears well-developed and well-nourished.  HENT:  Head: Normocephalic and atraumatic.  Eyes: Pupils are equal, round, and reactive to light. Conjunctivae are normal.  Neck: Normal range of motion.  Cardiovascular: Regular rhythm and normal heart sounds.  Respiratory: Effort normal.  GI: Soft.  Musculoskeletal: Normal range of motion.  Neurological: She is alert.  Skin: Skin is warm and dry.  Psychiatric: She has a normal mood and affect. Her behavior is normal. Judgment and thought content normal.     Assessment/Plan Treatment today and follow-up in 2 weeks  [name], MD 08/06/2019, 11:19 AM

## 2019-08-06 NOTE — Transfer of Care (Signed)
Immediate Anesthesia Transfer of Care Note  Patient: Melinda [name]  Procedure(s) Performed: ECT TX  Patient Location: PACU  Anesthesia Type:General  Level of Consciousness: sedated  Airway & Oxygen Therapy: Patient Spontanous Breathing and Patient connected to face mask oxygen  Post-op Assessment: Report given to RN and Post -op Vital signs reviewed and stable  Post vital signs: Reviewed and stable  Last Vitals:  Vitals Value Taken Time  BP 158/90 08/06/19 1141  Temp 37.1 C 08/06/19 1141  Pulse 97 08/06/19 1143  Resp 22 08/06/19 1143  SpO2 99 % 08/06/19 1143  Vitals shown include unvalidated device data.  Last Pain:  Vitals:   08/06/19 1141  TempSrc:   PainSc: Asleep         Complications: No apparent anesthesia complications

## 2019-08-06 NOTE — Anesthesia Preprocedure Evaluation (Signed)
Anesthesia Evaluation  Patient identified by MRN, date of birth, ID band Patient awake    Reviewed: Allergy & Precautions, NPO status , Patient's Chart, lab work & pertinent test results, reviewed documented beta blocker date and time   Airway Mallampati: III  TM Distance: >3 FB     Dental  (+) Chipped   Pulmonary sleep apnea ,           Cardiovascular hypertension, Pt. on medications      Neuro/Psych PSYCHIATRIC DISORDERS Depression Bipolar Disorder  Neuromuscular disease    GI/Hepatic GERD  Controlled,  Endo/Other  diabetes, Type 2  Renal/GU      Musculoskeletal   Abdominal   Peds  Hematology   Anesthesia Other Findings Obese.  Reproductive/Obstetrics                             Anesthesia Physical Anesthesia Plan  ASA: III  Anesthesia Plan: General   Post-op Pain Management:    Induction: Intravenous  PONV Risk Score and Plan:   Airway Management Planned:   Additional Equipment:   Intra-op Plan:   Post-operative Plan:   Informed Consent: I have reviewed the patients History and Physical, chart, labs and discussed the procedure including the risks, benefits and alternatives for the proposed anesthesia with the patient or authorized representative who has indicated his/her understanding and acceptance.       Plan Discussed with: CRNA  Anesthesia Plan Comments:         Anesthesia Quick Evaluation

## 2019-08-13 ENCOUNTER — Other Ambulatory Visit: Payer: Self-pay | Admitting: Physician Assistant

## 2019-08-13 DIAGNOSIS — E119 Type 2 diabetes mellitus without complications: Secondary | ICD-10-CM

## 2019-08-18 ENCOUNTER — Telehealth: Payer: Self-pay

## 2019-08-19 ENCOUNTER — Other Ambulatory Visit: Payer: Self-pay | Admitting: Psychiatry

## 2019-08-20 ENCOUNTER — Other Ambulatory Visit: Payer: Self-pay

## 2019-08-20 ENCOUNTER — Encounter: Payer: Self-pay | Admitting: Anesthesiology

## 2019-08-20 ENCOUNTER — Encounter
Admission: RE | Admit: 2019-08-20 | Discharge: 2019-08-20 | Disposition: A | Discharge status: Home / Self Care | Payer: Medicare PPO | Source: Ambulatory Visit | Attending: Psychiatry | Admitting: Psychiatry

## 2019-08-20 ENCOUNTER — Other Ambulatory Visit
Admission: RE | Admit: 2019-08-20 | Discharge: 2019-08-20 | Disposition: A | Discharge status: Home / Self Care | Payer: Medicare PPO | Source: Ambulatory Visit | Attending: Psychiatry | Admitting: Psychiatry

## 2019-08-20 DIAGNOSIS — E7800 Pure hypercholesterolemia, unspecified: Secondary | ICD-10-CM | POA: Insufficient documentation

## 2019-08-20 DIAGNOSIS — Z20828 Contact with and (suspected) exposure to other viral communicable diseases: Secondary | ICD-10-CM | POA: Diagnosis not present

## 2019-08-20 DIAGNOSIS — G4730 Sleep apnea, unspecified: Secondary | ICD-10-CM | POA: Diagnosis not present

## 2019-08-20 DIAGNOSIS — F329 Major depressive disorder, single episode, unspecified: Secondary | ICD-10-CM | POA: Diagnosis not present

## 2019-08-20 DIAGNOSIS — K219 Gastro-esophageal reflux disease without esophagitis: Secondary | ICD-10-CM | POA: Diagnosis not present

## 2019-08-20 DIAGNOSIS — E669 Obesity, unspecified: Secondary | ICD-10-CM | POA: Diagnosis not present

## 2019-08-20 DIAGNOSIS — E1142 Type 2 diabetes mellitus with diabetic polyneuropathy: Secondary | ICD-10-CM | POA: Diagnosis not present

## 2019-08-20 DIAGNOSIS — I10 Essential (primary) hypertension: Secondary | ICD-10-CM | POA: Insufficient documentation

## 2019-08-20 DIAGNOSIS — F332 Major depressive disorder, recurrent severe without psychotic features: Secondary | ICD-10-CM

## 2019-08-20 DIAGNOSIS — E119 Type 2 diabetes mellitus without complications: Secondary | ICD-10-CM | POA: Diagnosis not present

## 2019-08-20 DIAGNOSIS — Z01812 Encounter for preprocedural laboratory examination: Secondary | ICD-10-CM | POA: Diagnosis not present

## 2019-08-20 DIAGNOSIS — I429 Cardiomyopathy, unspecified: Secondary | ICD-10-CM | POA: Diagnosis not present

## 2019-08-20 LAB — SARS CORONAVIRUS 2 BY RT PCR (HOSPITAL ORDER, PERFORMED IN ~~LOC~~ HOSPITAL LAB): SARS Coronavirus 2: NEGATIVE

## 2019-08-20 LAB — GLUCOSE, CAPILLARY
Glucose-Capillary: 96 mg/dL (ref 70–99)
Glucose-Capillary: 125 mg/dL — ABNORMAL HIGH (ref 70–99)

## 2019-08-20 MED ORDER — GLYCOPYRROLATE 0.2 MG/ML IJ SOLN
INTRAMUSCULAR | Status: AC
  Administered 2019-08-20: 10:00:00 0.4 mg via INTRAVENOUS
  Filled 2019-08-20: qty 2

## 2019-08-20 MED ORDER — SUCCINYLCHOLINE CHLORIDE 20 MG/ML IJ SOLN
INTRAMUSCULAR | Status: AC
  Filled 2019-08-20: qty 1

## 2019-08-20 MED ORDER — KETOROLAC TROMETHAMINE 30 MG/ML IJ SOLN
INTRAMUSCULAR | Status: AC
  Administered 2019-08-20: 30 mg via INTRAVENOUS
  Filled 2019-08-20: qty 1

## 2019-08-20 MED ORDER — METHOHEXITAL SODIUM 100 MG/10ML IV SOSY
PREFILLED_SYRINGE | INTRAVENOUS | Status: DC | PRN
  Administered 2019-08-20: 70 mg via INTRAVENOUS

## 2019-08-20 MED ORDER — KETOROLAC TROMETHAMINE 30 MG/ML IJ SOLN
30.0000 mg | Freq: Once | INTRAMUSCULAR | Status: AC
  Administered 2019-08-20: 10:00:00 30 mg via INTRAVENOUS

## 2019-08-20 MED ORDER — LABETALOL HCL 5 MG/ML IV SOLN
INTRAVENOUS | Status: AC
  Filled 2019-08-20: qty 8

## 2019-08-20 MED ORDER — SUCCINYLCHOLINE CHLORIDE 200 MG/10ML IV SOSY
PREFILLED_SYRINGE | INTRAVENOUS | Status: DC | PRN
  Administered 2019-08-20: 100 mg via INTRAVENOUS

## 2019-08-20 MED ORDER — LABETALOL HCL 5 MG/ML IV SOLN
INTRAVENOUS | Status: DC | PRN
  Administered 2019-08-20: 30 mg via INTRAVENOUS

## 2019-08-20 MED ORDER — SODIUM CHLORIDE 0.9 % IV SOLN
INTRAVENOUS | Status: DC | PRN
  Administered 2019-08-20: 11:00:00 via INTRAVENOUS

## 2019-08-20 MED ORDER — ESMOLOL HCL 100 MG/10ML IV SOLN
INTRAVENOUS | Status: AC
  Filled 2019-08-20: qty 10

## 2019-08-20 MED ORDER — ESMOLOL HCL 100 MG/10ML IV SOLN
INTRAVENOUS | Status: DC | PRN
  Administered 2019-08-20: 20 mg via INTRAVENOUS

## 2019-08-20 MED ORDER — GLYCOPYRROLATE 0.2 MG/ML IJ SOLN
0.4000 mg | Freq: Once | INTRAMUSCULAR | Status: AC
  Administered 2019-08-20: 10:00:00 0.4 mg via INTRAVENOUS

## 2019-08-20 MED ORDER — SODIUM CHLORIDE 0.9 % IV SOLN
500.0000 mL | Freq: Once | INTRAVENOUS | Status: AC
  Administered 2019-08-20: 500 mL via INTRAVENOUS

## 2019-08-20 NOTE — H&P (Signed)
Melinda [name] is an 48 y.o. female.   Chief Complaint: chronic dysphoria  HPI: chronic depression  Past Medical History:  Diagnosis Date  . Depression   . Diabetes mellitus without complication ([place])   . Diabetic peripheral neuropathy ([place]) 03/07/15  . Diabetic peripheral neuropathy ([place]) 03/07/15  . Diabetic peripheral neuropathy ([place]) 03/07/15  . GERD (gastroesophageal reflux disease)   . Hypercholesterolemia 03/07/15  . Hypertension   . Obesity 03/07/15  . Personality disorder ([place]) 03/07/15  . Sinus tachycardia 03/07/15   history of  . Suicidal thoughts 03/07/15    Past Surgical History:  Procedure Laterality Date  . COLONOSCOPY WITH PROPOFOL N/A 12/01/2018   Procedure: COLONOSCOPY WITH PROPOFOL;  Surgeon: Jonathon Bellows, MD;  Location: [facility] ENDOSCOPY;  Service: Gastroenterology;  Laterality: N/A;  . electroconvulsion therapy  03/07/15    Family History  Problem Relation Age of Onset  . Hypertension Father   . Diabetes Mother    Social History:  reports that she has never smoked. She has never used smokeless tobacco. She reports that she does not drink alcohol or use drugs.  Allergies:  Allergies  Allergen Reactions  . Prednisone     Increases blood sugar    (Not in a hospital admission)   Results for orders placed or performed during the hospital encounter of 08/20/19 (from the past 48 hour(s))  Glucose, capillary     Status: None   Collection Time: 08/20/19  9:24 AM  Result Value Ref Range   Glucose-Capillary 96 70 - 99 mg/dL   No results found.  Review of Systems  Constitutional: Negative.   HENT: Negative.   Eyes: Negative.   Respiratory: Negative.   Cardiovascular: Negative.   Gastrointestinal: Negative.   Musculoskeletal: Negative.   Skin: Negative.   Neurological: Negative.   Psychiatric/Behavioral: Negative.     Blood pressure (!) 147/95, pulse 96, temperature 98.5 F (36.9 C), temperature source Oral, resp. rate 18. Physical Exam  Nursing note  and vitals reviewed. Constitutional: She appears well-developed and well-nourished.  HENT:  Head: Normocephalic and atraumatic.  Eyes: Pupils are equal, round, and reactive to light. Conjunctivae are normal.  Neck: Normal range of motion.  Cardiovascular: Regular rhythm and normal heart sounds.  Respiratory: Effort normal.  GI: Soft.  Musculoskeletal: Normal range of motion.  Neurological: She is alert.  Skin: Skin is warm and dry.  Psychiatric: She has a normal mood and affect. Her behavior is normal. Judgment and thought content normal.     Assessment/Plan Continue maintenance ect follow up 2 week  [name], MD 08/20/2019, 9:52 AM

## 2019-08-20 NOTE — Anesthesia Post-op Follow-up Note (Signed)
Anesthesia QCDR form completed.        

## 2019-08-20 NOTE — Anesthesia Postprocedure Evaluation (Signed)
Anesthesia Post Note  Patient: Melinda [name]  Procedure(s) Performed: ECT TX  Patient location during evaluation: PACU Anesthesia Type: General Level of consciousness: awake and alert Pain management: pain level controlled Vital Signs Assessment: post-procedure vital signs reviewed and stable Respiratory status: spontaneous breathing, nonlabored ventilation, respiratory function stable and patient connected to nasal cannula oxygen Cardiovascular status: blood pressure returned to baseline and stable Postop Assessment: no apparent nausea or vomiting Anesthetic complications: no     Last Vitals:  Vitals:   08/20/19 1130 08/20/19 1205  BP: 139/81 135/89  Pulse: 90 89  Resp: (!) 21 18  Temp: (!) 36.4 C 36.6 C  SpO2: 97%     Last Pain:  Vitals:   08/20/19 1205  TempSrc: Oral  PainSc: 0-No pain                 Precious Haws Timohty Renbarger

## 2019-08-20 NOTE — Procedures (Signed)
ECT SERVICES Physician's Interval Evaluation & Treatment Note  Patient Identification: Melinda [name] MRN:  [id number] Date of Evaluation:  08/20/2019 TX #: [id number]  MADRS:   MMSE:   P.E. Findings:  No change to physical exam  Psychiatric Interval Note:  Mood is feeling fine stable no complaints  Subjective:  Patient is a 48 y.o. female seen for evaluation for Electroconvulsive Therapy. She had a tooth pulled recently but no other new since last time  Treatment Summary:   []   Right Unilateral             [x]  Bilateral   % Energy : 1.0 ms 35%   Impedance: 1420 ohms  Seizure Energy Index: 1936 V acquired  Postictal Suppression Index: 51%  Seizure Concordance Index: 81%  Medications  Pre Shock: Robinul 0.4 mg Toradol 30 mg esmolol 30 mg labetalol 20 mg Brevital 70 mg succinylcholine 100 mg  Post Shock:    Seizure Duration: 27 seconds EMG 50 seconds EEG   Comments: Follow-up in 2 weeks  Lungs:  [x]   Clear to auscultation               []  Other:   Heart:    [x]   Regular rhythm             []  irregular rhythm    [x]   Previous H&P reviewed, patient examined and there are NO CHANGES                 []   Previous H&P reviewed, patient examined and there are changes noted.   [name], MD 10/9/202010:57 AM

## 2019-08-20 NOTE — Transfer of Care (Signed)
Immediate Anesthesia Transfer of Care Note  Patient: Melinda [name]  Procedure(s) Performed: ECT TX  Patient Location: PACU  Anesthesia Type:General  Level of Consciousness: sedated  Airway & Oxygen Therapy: Patient Spontanous Breathing and Patient connected to face mask oxygen  Post-op Assessment: Report given to RN and Post -op Vital signs reviewed and stable  Post vital signs: Reviewed and stable  Last Vitals:  Vitals Value Taken Time  BP 130/72 08/20/19 1104  Temp    Pulse 95 08/20/19 1105  Resp 21 08/20/19 1105  SpO2 96 % 08/20/19 1105  Vitals shown include unvalidated device data.  Last Pain:  Vitals:   08/20/19 0909  TempSrc: Oral  PainSc:          Complications: No apparent anesthesia complications

## 2019-08-20 NOTE — Anesthesia Preprocedure Evaluation (Addendum)
Anesthesia Evaluation  Patient identified by MRN, date of birth, ID band Patient awake    Reviewed: Allergy & Precautions, NPO status , Patient's Chart, lab work & pertinent test results, reviewed documented beta blocker date and time   Airway Mallampati: III  TM Distance: >3 FB     Dental  (+) Chipped   Pulmonary sleep apnea ,           Cardiovascular hypertension, Pt. on medications      Neuro/Psych PSYCHIATRIC DISORDERS Depression Bipolar Disorder  Neuromuscular disease    GI/Hepatic GERD  Controlled,  Endo/Other  diabetes, Type 2  Renal/GU      Musculoskeletal   Abdominal   Peds  Hematology   Anesthesia Other Findings Obese.  Past Medical History: No date: Depression No date: Diabetes mellitus without complication ([place]) 99991111: Diabetic peripheral neuropathy ([place]) 03/07/15: Diabetic peripheral neuropathy ([place]) 03/07/15: Diabetic peripheral neuropathy ([place]) No date: GERD (gastroesophageal reflux disease) 03/07/15: Hypercholesterolemia No date: Hypertension 03/07/15: Obesity 03/07/15: Personality disorder ([place]) 03/07/15: Sinus tachycardia     Comment:  history of 03/07/15: Suicidal thoughts   Reproductive/Obstetrics                            Anesthesia Physical  Anesthesia Plan  ASA: III  Anesthesia Plan: General   Post-op Pain Management:    Induction: Intravenous  PONV Risk Score and Plan:   Airway Management Planned: Mask  Additional Equipment:   Intra-op Plan:   Post-operative Plan:   Informed Consent: I have reviewed the patients History and Physical, chart, labs and discussed the procedure including the risks, benefits and alternatives for the proposed anesthesia with the patient or authorized representative who has indicated his/her understanding and acceptance.     Dental Advisory Given  Plan Discussed with: Anesthesiologist, CRNA and Surgeon  Anesthesia  Plan Comments: (Patient consented for risks of anesthesia including but not limited to:  - adverse reactions to medications - risk of intubation if required - damage to teeth, lips or other oral mucosa - sore throat or hoarseness - Damage to heart, brain, lungs or loss of life  Patient voiced understanding.)      Anesthesia Quick Evaluation                                  Anesthesia Evaluation  Patient identified by MRN, date of birth, ID band Patient awake    Reviewed: Allergy & Precautions, H&P , NPO status , Patient's Chart, lab work & pertinent test results, reviewed documented beta blocker date and time   History of Anesthesia Complications Negative for: history of anesthetic complications  Airway Mallampati: III  TM Distance: >3 FB Neck ROM: limited    Dental  (+) Chipped   Pulmonary neg shortness of breath, sleep apnea ,           Cardiovascular Exercise Tolerance: Poor hypertension, On Medications      Neuro/Psych PSYCHIATRIC DISORDERS Depression Bipolar Disorder  Neuromuscular disease    GI/Hepatic Neg liver ROS, GERD  Medicated and Controlled,  Endo/Other  diabetes, Type 2Morbid obesity  Renal/GU negative Renal ROS  negative genitourinary   Musculoskeletal   Abdominal   Peds  Hematology negative hematology ROS (+)   Anesthesia Other Findings Past Medical History: No date: Depression No date: Diabetes mellitus without complication ([place]) 99991111: Diabetic peripheral neuropathy ([place]) 03/07/15: Diabetic peripheral neuropathy ([place]) 03/07/15: Diabetic  peripheral neuropathy ([facility]) No date: GERD (gastroesophageal reflux disease) 03/07/15: Hypercholesterolemia No date: Hypertension 03/07/15: Obesity 03/07/15: Personality disorder ([place]) 03/07/15: Sinus tachycardia     Comment:  history of 03/07/15: Suicidal thoughts Past Surgical History: 12/01/2018: COLONOSCOPY WITH PROPOFOL; N/A     Comment:  Procedure: COLONOSCOPY WITH PROPOFOL;   Surgeon: Jonathon Bellows, MD;  Location: [facility] ENDOSCOPY;  Service:               Gastroenterology;  Laterality: N/A; 03/07/15: electroconvulsion therapy   Reproductive/Obstetrics negative OB ROS                             Anesthesia Physical  Anesthesia Plan  ASA: III  Anesthesia Plan: General   Post-op Pain Management:    Induction: Inhalational  PONV Risk Score and Plan:   Airway Management Planned: Mask  Additional Equipment:   Intra-op Plan:   Post-operative Plan:   Informed Consent:                                   Anesthesia Evaluation  Patient identified by MRN, date of birth, ID band Patient awake    Reviewed: Allergy & Precautions, H&P , NPO status , Patient's Chart, lab work & pertinent test results, reviewed documented beta blocker date and time   History of Anesthesia Complications Negative for: history of anesthetic complications  Airway Mallampati: III  TM Distance: >3 FB Neck ROM: limited    Dental  (+) Chipped   Pulmonary neg shortness of breath, sleep apnea ,           Cardiovascular Exercise Tolerance: Poor hypertension, On Medications      Neuro/Psych PSYCHIATRIC DISORDERS Depression Bipolar Disorder  Neuromuscular disease    GI/Hepatic Neg liver ROS, GERD  Medicated and Controlled,  Endo/Other  diabetes, Type 2Morbid obesity  Renal/GU negative Renal ROS  negative genitourinary   Musculoskeletal   Abdominal   Peds  Hematology negative hematology ROS (+)   Anesthesia Other Findings Past Medical History: No date: Depression No date: Diabetes mellitus without complication ([place]) 99991111: Diabetic peripheral neuropathy ([place]) 03/07/15: Diabetic peripheral neuropathy ([place]) 03/07/15: Diabetic peripheral neuropathy ([facility]) No date: GERD (gastroesophageal reflux disease) 03/07/15: Hypercholesterolemia No date: Hypertension 03/07/15: Obesity 03/07/15: Personality disorder  ([place]) 03/07/15: Sinus tachycardia     Comment:  history of 03/07/15: Suicidal thoughts Past Surgical History: 12/01/2018: COLONOSCOPY WITH PROPOFOL; N/A     Comment:  Procedure: COLONOSCOPY WITH PROPOFOL;  Surgeon: Jonathon Bellows, MD;  Location: [facility] ENDOSCOPY;  Service:               Gastroenterology;  Laterality: N/A; 03/07/15: electroconvulsion therapy   Reproductive/Obstetrics negative OB ROS                             Anesthesia Physical  Anesthesia Plan  ASA: III  Anesthesia Plan: General   Post-op Pain Management:    Induction: Inhalational  PONV Risk Score and Plan:   Airway Management Planned: Mask  Additional Equipment:   Intra-op Plan:   Post-operative Plan:   Informed Consent: I have reviewed the patients History and Physical, chart, labs and discussed the procedure including the risks,  benefits and alternatives for the proposed anesthesia with the patient or authorized representative who has indicated his/her understanding and acceptance.     Dental Advisory Given  Plan Discussed with: Anesthesiologist  Anesthesia Plan Comments: (Patient consented for risks of anesthesia including but not limited to:  - adverse reactions to medications - risk of intubation if required - damage to teeth, lips or other oral mucosa - sore throat or hoarseness - Damage to heart, brain, lungs or loss of life  Patient voiced understanding.)        Anesthesia Quick Evaluation                                   Anesthesia Evaluation  Patient identified by MRN, date of birth, ID band Patient awake    Reviewed: Allergy & Precautions, H&P , NPO status , Patient's Chart, lab work & pertinent test results  History of Anesthesia Complications Negative for: history of anesthetic complications  Airway Mallampati: II  TM Distance: >3 FB Neck ROM: full    Dental  (+) Poor Dentition, Chipped   Pulmonary sleep apnea , neg COPD,     Pulmonary exam normal breath sounds clear to auscultation       Cardiovascular hypertension, Pt. on medications (-) CAD and (-) Past MI negative cardio ROS Normal cardiovascular exam Rhythm:regular Rate:Normal     Neuro/Psych PSYCHIATRIC DISORDERS Depression Bipolar Disorder  Neuromuscular disease negative neurological ROS     GI/Hepatic negative GI ROS, Neg liver ROS, GERD  Medicated,  Endo/Other  diabetes, Type 2, Oral Hypoglycemic Agents  Renal/GU negative Renal ROS  negative genitourinary   Musculoskeletal   Abdominal (+) + obese,   Peds  Hematology negative hematology ROS (+)   Anesthesia Other Findings Past Medical History: No date: Depression No date: Diabetes mellitus without complication ([facility]) 99991111: Diabetic peripheral neuropathy ([place]) 03/07/15: Diabetic peripheral neuropathy ([place]) 03/07/15: Diabetic peripheral neuropathy ([facility]) No date: GERD (gastroesophageal reflux disease) 03/07/15: Hypercholesterolemia No date: Hypertension 03/07/15: Obesity 03/07/15: Personality disorder 03/07/15: Sinus tachycardia ([place])     Comment: history of 03/07/15: Suicidal thoughts Past Surgical History: 03/07/15: electroconvulsion therapy BMI    Body Mass Index:  36.44 kg/m     Reproductive/Obstetrics                             Anesthesia Physical  Anesthesia Plan  ASA: III  Anesthesia Plan: General   Post-op Pain Management:    Induction: Intravenous  PONV Risk Score and Plan: 2 and Ondansetron  Airway Management Planned: Mask  Additional Equipment:   Intra-op Plan:   Post-operative Plan:   Informed Consent: I have reviewed the patients History and Physical, chart, labs and discussed the procedure including the risks, benefits and alternatives for the proposed anesthesia with the patient or authorized representative who has indicated his/her understanding and acceptance.     Dental Advisory Given  Plan Discussed with:  CRNA and Anesthesiologist  Anesthesia Plan Comments:         Anesthesia Quick Evaluation                                   Anesthesia Evaluation  Patient identified by MRN, date of birth, ID band Patient awake    Reviewed: Allergy & Precautions, H&P , NPO status , Patient's Chart, lab  work & pertinent test results, reviewed documented beta blocker date and time   History of Anesthesia Complications Negative for: history of anesthetic complications  Airway Mallampati: III  TM Distance: >3 FB Neck ROM: limited    Dental  (+) Chipped   Pulmonary neg shortness of breath, sleep apnea ,           Cardiovascular Exercise Tolerance: Poor hypertension, On Medications      Neuro/Psych PSYCHIATRIC DISORDERS Depression Bipolar Disorder  Neuromuscular disease    GI/Hepatic Neg liver ROS, GERD  Medicated and Controlled,  Endo/Other  diabetes, Type 2Morbid obesity  Renal/GU negative Renal ROS  negative genitourinary   Musculoskeletal   Abdominal   Peds  Hematology negative hematology ROS (+)   Anesthesia Other Findings Past Medical History: No date: Depression No date: Diabetes mellitus without complication ([place]) 99991111: Diabetic peripheral neuropathy ([place]) 03/07/15: Diabetic peripheral neuropathy ([place]) 03/07/15: Diabetic peripheral neuropathy ([facility]) No date: GERD (gastroesophageal reflux disease) 03/07/15: Hypercholesterolemia No date: Hypertension 03/07/15: Obesity 03/07/15: Personality disorder ([place]) 03/07/15: Sinus tachycardia     Comment:  history of 03/07/15: Suicidal thoughts Past Surgical History: 12/01/2018: COLONOSCOPY WITH PROPOFOL; N/A     Comment:  Procedure: COLONOSCOPY WITH PROPOFOL;  Surgeon: Jonathon Bellows, MD;  Location: [facility] ENDOSCOPY;  Service:               Gastroenterology;  Laterality: N/A; 03/07/15: electroconvulsion therapy   Reproductive/Obstetrics negative OB ROS                              Anesthesia Physical  Anesthesia Plan  ASA: III  Anesthesia Plan: General   Post-op Pain Management:    Induction: Inhalational  PONV Risk Score and Plan:   Airway Management Planned: Mask  Additional Equipment:   Intra-op Plan:   Post-operative Plan:   Informed Consent: I have reviewed the patients History and Physical, chart, labs and discussed the procedure including the risks, benefits and alternatives for the proposed anesthesia with the patient or authorized representative who has indicated his/her understanding and acceptance.     Dental Advisory Given  Plan Discussed with: Anesthesiologist  Anesthesia Plan Comments: (Patient consented for risks of anesthesia including but not limited to:  - adverse reactions to medications - risk of intubation if required - damage to teeth, lips or other oral mucosa - sore throat or hoarseness - Damage to heart, brain, lungs or loss of life  Patient voiced understanding.)        Anesthesia Quick Evaluation I have reviewed the patients History and Physical, chart, labs and discussed the procedure including the risks, benefits and alternatives for the proposed anesthesia with the patient or authorized representative who has indicated his/her understanding and acceptance.     Dental Advisory Given  Plan Discussed with: Anesthesiologist  Anesthesia Plan Comments: (Patient consented for risks of anesthesia including but not limited to:  - adverse reactions to medications - risk of intubation if required - damage to teeth, lips or other oral mucosa - sore throat or hoarseness - Damage to heart, brain, lungs or loss of life  Patient voiced understanding.)        Anesthesia Quick Evaluation

## 2019-08-20 NOTE — Discharge Instructions (Signed)
1)  The drugs that you have been given will stay in your system until tomorrow so for the       next 24 hours you should not:  A. Drive an automobile  B. Make any legal decisions  C. Drink any alcoholic beverages  2)  You may resume your regular meals upon return home.  3)  A responsible adult must take you home.  Someone should stay with you for a few          hours, then be available by phone for the remainder of the treatment day.  4)  You May experience any of the following symptoms:  Headache, Nausea and a dry mouth (due to the medications you were given),  temporary memory loss and some confusion, or sore muscles (a warm bath  should help this).  If you you experience any of these symptoms let us know on                your return visit.  5)  Report any of the following: any acute discomfort, severe headache, or temperature        greater than 100.5 F.   Also report any unusual redness, swelling, drainage, or pain         at your IV site.    You may report Symptoms to:  [facility]          Phone: [phone], ECT Department           or Dr. Prescott Gum office [phone]  6)  Your next ECT Treatment is Friday October 23 at 8:00 to receive a COVID test prior to your ECT appointment   We will call 2 days prior to your scheduled appointment for arrival times.  7)  Nothing to eat or drink after midnight the night before your procedure.  8)  Take    With a sip of water the morning of your procedure.  9)  Other Instructions: Call [phone] to cancel the morning of your procedure due         to illness or emergency.  10) We will call within 72 hours to assess how you are feeling.

## 2019-08-20 NOTE — Anesthesia Procedure Notes (Signed)
Date/Time: 08/20/2019 10:54 AM Performed by: Dionne Bucy, CRNA Pre-anesthesia Checklist: Patient identified, Emergency Drugs available, Suction available and Patient being monitored Patient Re-evaluated:Patient Re-evaluated prior to induction Oxygen Delivery Method: Circle system utilized Preoxygenation: Pre-oxygenation with 100% oxygen Induction Type: IV induction Ventilation: Mask ventilation without difficulty and Mask ventilation throughout procedure Airway Equipment and Method: Bite block Placement Confirmation: positive ETCO2 Dental Injury: Teeth and Oropharynx as per pre-operative assessment

## 2019-08-27 ENCOUNTER — Telehealth: Payer: Self-pay

## 2019-08-30 NOTE — Telephone Encounter (Signed)
Error

## 2019-09-01 ENCOUNTER — Other Ambulatory Visit: Payer: Self-pay | Admitting: Physician Assistant

## 2019-09-01 ENCOUNTER — Telehealth: Payer: Self-pay

## 2019-09-01 DIAGNOSIS — I10 Essential (primary) hypertension: Secondary | ICD-10-CM

## 2019-09-02 ENCOUNTER — Other Ambulatory Visit: Payer: Self-pay | Admitting: Psychiatry

## 2019-09-02 ENCOUNTER — Other Ambulatory Visit
Admission: RE | Admit: 2019-09-02 | Discharge: 2019-09-02 | Disposition: A | Discharge status: Home / Self Care | Payer: Medicare PPO | Source: Ambulatory Visit | Attending: Psychiatry | Admitting: Psychiatry

## 2019-09-02 ENCOUNTER — Other Ambulatory Visit: Payer: Self-pay

## 2019-09-02 DIAGNOSIS — Z01812 Encounter for preprocedural laboratory examination: Secondary | ICD-10-CM | POA: Diagnosis not present

## 2019-09-02 DIAGNOSIS — Z20828 Contact with and (suspected) exposure to other viral communicable diseases: Secondary | ICD-10-CM | POA: Diagnosis not present

## 2019-09-02 LAB — SARS CORONAVIRUS 2 (TAT 6-24 HRS): SARS Coronavirus 2: NEGATIVE

## 2019-09-03 ENCOUNTER — Encounter (HOSPITAL_BASED_OUTPATIENT_CLINIC_OR_DEPARTMENT_OTHER)
Admission: RE | Admit: 2019-09-03 | Discharge: 2019-09-03 | Disposition: A | Discharge status: Home / Self Care | Payer: Medicare PPO | Source: Ambulatory Visit | Attending: Psychiatry | Admitting: Psychiatry

## 2019-09-03 ENCOUNTER — Encounter: Payer: Self-pay | Admitting: Anesthesiology

## 2019-09-03 DIAGNOSIS — F332 Major depressive disorder, recurrent severe without psychotic features: Secondary | ICD-10-CM

## 2019-09-03 DIAGNOSIS — E669 Obesity, unspecified: Secondary | ICD-10-CM | POA: Diagnosis not present

## 2019-09-03 DIAGNOSIS — I10 Essential (primary) hypertension: Secondary | ICD-10-CM | POA: Diagnosis not present

## 2019-09-03 DIAGNOSIS — E1142 Type 2 diabetes mellitus with diabetic polyneuropathy: Secondary | ICD-10-CM | POA: Diagnosis not present

## 2019-09-03 DIAGNOSIS — F329 Major depressive disorder, single episode, unspecified: Secondary | ICD-10-CM | POA: Diagnosis not present

## 2019-09-03 DIAGNOSIS — Z20828 Contact with and (suspected) exposure to other viral communicable diseases: Secondary | ICD-10-CM | POA: Diagnosis not present

## 2019-09-03 DIAGNOSIS — E7800 Pure hypercholesterolemia, unspecified: Secondary | ICD-10-CM | POA: Diagnosis not present

## 2019-09-03 LAB — GLUCOSE, CAPILLARY: Glucose-Capillary: 101 mg/dL — ABNORMAL HIGH (ref 70–99)

## 2019-09-03 MED ORDER — ESMOLOL HCL 100 MG/10ML IV SOLN
INTRAVENOUS | Status: AC
  Filled 2019-09-03: qty 10

## 2019-09-03 MED ORDER — SUCCINYLCHOLINE CHLORIDE 200 MG/10ML IV SOSY
PREFILLED_SYRINGE | INTRAVENOUS | Status: DC | PRN
  Administered 2019-09-03: 100 mg via INTRAVENOUS

## 2019-09-03 MED ORDER — SODIUM CHLORIDE 0.9 % IV SOLN
500.0000 mL | Freq: Once | INTRAVENOUS | Status: AC
  Administered 2019-09-03: 500 mL via INTRAVENOUS

## 2019-09-03 MED ORDER — KETOROLAC TROMETHAMINE 30 MG/ML IJ SOLN
INTRAMUSCULAR | Status: AC
  Filled 2019-09-03: qty 1

## 2019-09-03 MED ORDER — SUCCINYLCHOLINE CHLORIDE 20 MG/ML IJ SOLN
INTRAMUSCULAR | Status: AC
  Filled 2019-09-03: qty 1

## 2019-09-03 MED ORDER — METHOHEXITAL SODIUM 0.5 G IJ SOLR
INTRAMUSCULAR | Status: AC
  Filled 2019-09-03: qty 500

## 2019-09-03 MED ORDER — METHOHEXITAL SODIUM 100 MG/10ML IV SOSY
PREFILLED_SYRINGE | INTRAVENOUS | Status: DC | PRN
  Administered 2019-09-03: 70 mg via INTRAVENOUS

## 2019-09-03 MED ORDER — KETOROLAC TROMETHAMINE 30 MG/ML IJ SOLN
30.0000 mg | Freq: Once | INTRAMUSCULAR | Status: AC
  Administered 2019-09-03: 30 mg via INTRAVENOUS

## 2019-09-03 MED ORDER — GLYCOPYRROLATE 0.2 MG/ML IJ SOLN
INTRAMUSCULAR | Status: AC
  Filled 2019-09-03: qty 2

## 2019-09-03 MED ORDER — LABETALOL HCL 5 MG/ML IV SOLN
INTRAVENOUS | Status: DC | PRN
  Administered 2019-09-03: 30 mg via INTRAVENOUS

## 2019-09-03 MED ORDER — GLYCOPYRROLATE 0.2 MG/ML IJ SOLN
0.4000 mg | Freq: Once | INTRAMUSCULAR | Status: AC
  Administered 2019-09-03: 0.4 mg via INTRAVENOUS

## 2019-09-03 MED ORDER — LABETALOL HCL 5 MG/ML IV SOLN
INTRAVENOUS | Status: AC
  Filled 2019-09-03: qty 4

## 2019-09-03 MED ORDER — ESMOLOL HCL 100 MG/10ML IV SOLN
INTRAVENOUS | Status: DC | PRN
  Administered 2019-09-03: 20 mg via INTRAVENOUS

## 2019-09-03 NOTE — Anesthesia Post-op Follow-up Note (Signed)
Anesthesia QCDR form completed.        

## 2019-09-03 NOTE — Transfer of Care (Signed)
Immediate Anesthesia Transfer of Care Note  Patient: Melinda [name]  Procedure(s) Performed: ECT TX  Patient Location: PACU  Anesthesia Type:General  Level of Consciousness: sedated  Airway & Oxygen Therapy: Patient Spontanous Breathing and Patient connected to face mask oxygen  Post-op Assessment: Report given to RN and Post -op Vital signs reviewed and stable  Post vital signs: Reviewed and stable  Last Vitals:  Vitals Value Taken Time  BP 129/91 09/03/19 1135  Temp 36.2 C 09/03/19 1135  Pulse 99 09/03/19 1137  Resp 21 09/03/19 1137  SpO2 94 % 09/03/19 1137  Vitals shown include unvalidated device data.  Last Pain:  Vitals:   09/03/19 1135  TempSrc:   PainSc: Asleep         Complications: No apparent anesthesia complications

## 2019-09-03 NOTE — H&P (Signed)
Melinda [name] is an 48 y.o. female.   Chief Complaint: no new complaint HPI: chronic depression held at [facility]  Past Medical History:  Diagnosis Date  . Depression   . Diabetes mellitus without complication ([place])   . Diabetic peripheral neuropathy ([place]) 03/07/15  . Diabetic peripheral neuropathy ([place]) 03/07/15  . Diabetic peripheral neuropathy ([place]) 03/07/15  . GERD (gastroesophageal reflux disease)   . Hypercholesterolemia 03/07/15  . Hypertension   . Obesity 03/07/15  . Personality disorder ([place]) 03/07/15  . Sinus tachycardia 03/07/15   history of  . Suicidal thoughts 03/07/15    Past Surgical History:  Procedure Laterality Date  . COLONOSCOPY WITH PROPOFOL N/A 12/01/2018   Procedure: COLONOSCOPY WITH PROPOFOL;  Surgeon: Jonathon Bellows, MD;  Location: [facility] ENDOSCOPY;  Service: Gastroenterology;  Laterality: N/A;  . electroconvulsion therapy  03/07/15    Family History  Problem Relation Age of Onset  . Hypertension Father   . Diabetes Mother    Social History:  reports that she has never smoked. She has never used smokeless tobacco. She reports that she does not drink alcohol or use drugs.  Allergies:  Allergies  Allergen Reactions  . Prednisone     Increases blood sugar    (Not in a hospital admission)   Results for orders placed or performed during the hospital encounter of 09/02/19 (from the past 48 hour(s))  SARS CORONAVIRUS 2 (TAT 6-24 HRS) Nasopharyngeal Nasopharyngeal Swab     Status: None   Collection Time: 09/02/19  9:14 AM   Specimen: Nasopharyngeal Swab  Result Value Ref Range   SARS Coronavirus 2 NEGATIVE NEGATIVE    Comment: (NOTE) SARS-CoV-2 target nucleic acids are NOT DETECTED. The SARS-CoV-2 RNA is generally detectable in upper and lower respiratory specimens during the acute phase of infection. Negative results do not preclude SARS-CoV-2 infection, do not rule out co-infections with other pathogens, and should not be used as the sole basis for treatment  or other patient management decisions. Negative results must be combined with clinical observations, patient history, and epidemiological information. The expected result is Negative. Fact Sheet for Patients: SugarRoll.be Fact Sheet for Healthcare Providers: https://www.woods-mathews.com/ This test is not yet approved or cleared by the [place] FDA and  has been authorized for detection and/or diagnosis of SARS-CoV-2 by FDA under an Emergency Use Authorization (EUA). This EUA will remain  in effect (meaning this test can be used) for the duration of the COVID-19 declaration under Section 56 4(b)(1) of the Act, 21 U.S.C. section 360bbb-3(b)(1), unless the authorization is terminated or revoked sooner. Performed at [facility], [place] [address] [place], [place] [address]    No results found.  Review of Systems  Constitutional: Negative.   HENT: Negative.   Eyes: Negative.   Respiratory: Negative.   Cardiovascular: Negative.   Gastrointestinal: Negative.   Musculoskeletal: Negative.   Skin: Negative.   Neurological: Negative.   Psychiatric/Behavioral: Negative.     Blood pressure 129/75, pulse 86, temperature 98.1 F (36.7 C), temperature source Oral, resp. rate 18, SpO2 98 %. Physical Exam  Nursing note and vitals reviewed. Constitutional: She appears well-developed and well-nourished.  HENT:  Head: Normocephalic and atraumatic.  Eyes: Pupils are equal, round, and reactive to light. Conjunctivae are normal.  Neck: Normal range of motion.  Cardiovascular: Regular rhythm and normal heart sounds.  Respiratory: Effort normal.  GI: Soft.  Musculoskeletal: Normal range of motion.  Neurological: She is alert.  Skin: Skin is warm and dry.  Psychiatric:  She has a normal mood and affect. Her behavior is normal. Judgment and thought content normal.     Assessment/Plan Continue q2week schedule  [name], MD 09/03/2019,  9:53 AM

## 2019-09-03 NOTE — Anesthesia Preprocedure Evaluation (Signed)
Anesthesia Evaluation  Patient identified by MRN, date of birth, ID band Patient awake    Reviewed: Allergy & Precautions, H&P , NPO status , Patient's Chart, lab work & pertinent test results  History of Anesthesia Complications Negative for: history of anesthetic complications  Airway Mallampati: II  TM Distance: >3 FB Neck ROM: full    Dental  (+) Poor Dentition, Chipped   Pulmonary sleep apnea , neg COPD,    Pulmonary exam normal breath sounds clear to auscultation       Cardiovascular hypertension, Pt. on medications (-) CAD and (-) Past MI negative cardio ROS Normal cardiovascular exam Rhythm:regular Rate:Normal     Neuro/Psych PSYCHIATRIC DISORDERS Depression Bipolar Disorder  Neuromuscular disease negative neurological ROS     GI/Hepatic negative GI ROS, Neg liver ROS, GERD  Medicated,  Endo/Other  diabetes, Type 2, Oral Hypoglycemic Agents  Renal/GU negative Renal ROS  negative genitourinary   Musculoskeletal   Abdominal (+) + obese,   Peds  Hematology negative hematology ROS (+)   Anesthesia Other Findings Past Medical History: No date: Depression No date: Diabetes mellitus without complication (HCC) 2/56/38: Diabetic peripheral neuropathy ([place]) 03/07/15: Diabetic peripheral neuropathy ([place]) 03/07/15: Diabetic peripheral neuropathy (HCC) No date: GERD (gastroesophageal reflux disease) 03/07/15: Hypercholesterolemia No date: Hypertension 03/07/15: Obesity 03/07/15: Personality disorder 03/07/15: Sinus tachycardia ([place])     Comment: history of 03/07/15: Suicidal thoughts Past Surgical History: 03/07/15: electroconvulsion therapy BMI    Body Mass Index:  36.44 kg/m     Reproductive/Obstetrics                             Anesthesia Physical  Anesthesia Plan  ASA: III  Anesthesia Plan: General   Post-op Pain Management:    Induction: Intravenous  PONV Risk Score and  Plan: 2 and Ondansetron  Airway Management Planned: Mask  Additional Equipment:   Intra-op Plan:   Post-operative Plan:   Informed Consent: I have reviewed the patients History and Physical, chart, labs and discussed the procedure including the risks, benefits and alternatives for the proposed anesthesia with the patient or authorized representative who has indicated his/her understanding and acceptance.     Dental Advisory Given  Plan Discussed with: CRNA and Anesthesiologist  Anesthesia Plan Comments:         Anesthesia Quick Evaluation

## 2019-09-03 NOTE — Discharge Instructions (Signed)
1)  The drugs that you have been given will stay in your system until tomorrow so for the       next 24 hours you should not:  A. Drive an automobile  B. Make any legal decisions  C. Drink any alcoholic beverages  2)  You may resume your regular meals upon return home.  3)  A responsible adult must take you home.  Someone should stay with you for a few          hours, then be available by phone for the remainder of the treatment day.  4)  You May experience any of the following symptoms:  Headache, Nausea and a dry mouth (due to the medications you were given),  temporary memory loss and some confusion, or sore muscles (a warm bath  should help this).  If you you experience any of these symptoms let us know on                your return visit.  5)  Report any of the following: any acute discomfort, severe headache, or temperature        greater than 100.5 F.   Also report any unusual redness, swelling, drainage, or pain         at your IV site.    You may report Symptoms to:  [place] at [facility]          Phone: [phone], ECT Department           or Dr. Prescott Gum office [phone]  6)  Your next ECT Treatment is Friday November 6   We will call 2 days prior to your scheduled appointment for arrival times.  7)  Nothing to eat or drink after midnight the night before your procedure.  8)  Take     With a sip of water the morning of your procedure.  9)  Other Instructions: Call [phone] to cancel the morning of your procedure due         to illness or emergency.  10) We will call within 72 hours to assess how you are feeling.

## 2019-09-03 NOTE — Anesthesia Procedure Notes (Signed)
Date/Time: 09/03/2019 11:24 AM Performed by: Dionne Bucy, CRNA Pre-anesthesia Checklist: Patient identified, Emergency Drugs available, Suction available and Patient being monitored Patient Re-evaluated:Patient Re-evaluated prior to induction Oxygen Delivery Method: Circle system utilized Preoxygenation: Pre-oxygenation with 100% oxygen Induction Type: IV induction Ventilation: Mask ventilation without difficulty and Mask ventilation throughout procedure Airway Equipment and Method: Bite block Placement Confirmation: positive ETCO2 Dental Injury: Teeth and Oropharynx as per pre-operative assessment

## 2019-09-03 NOTE — Anesthesia Postprocedure Evaluation (Signed)
Anesthesia Post Note  Patient: Melinda [name]  Procedure(s) Performed: ECT TX  Patient location during evaluation: PACU Anesthesia Type: General Level of consciousness: awake and alert and oriented Pain management: pain level controlled Vital Signs Assessment: post-procedure vital signs reviewed and stable Respiratory status: spontaneous breathing, nonlabored ventilation and respiratory function stable Cardiovascular status: blood pressure returned to baseline and stable Postop Assessment: no signs of nausea or vomiting Anesthetic complications: no     Last Vitals:  Vitals:   09/03/19 1154 09/03/19 1155  BP:  105/79  Pulse: 95 96  Resp: (!) 24 (!) 26  Temp:    SpO2: 94% 94%    Last Pain:  Vitals:   09/03/19 1155  TempSrc:   PainSc: 0-No pain                 Takeo Harts

## 2019-09-03 NOTE — Procedures (Signed)
ECT SERVICES Physician's Interval Evaluation & Treatment Note  Patient Identification: Melinda [name] MRN:  [id number] Date of Evaluation:  09/03/2019 TX #: [id number]  MADRS:   MMSE:   P.E. Findings:  No change physical exam  Psychiatric Interval Note:  Stable not depressed  Subjective:  Patient is a 48 y.o. female seen for evaluation for Electroconvulsive Therapy. No complaints  Treatment Summary:   []   Right Unilateral             [x]  Bilateral   % Energy : 1.0 ms 35%   Impedance: 1380 ohms  Seizure Energy Index: 5186 V squared  Postictal Suppression Index: 77%  Seizure Concordance Index: 97%  Medications  Pre Shock: Robinul 0.4 mg Toradol 30 mg labetalol 20 mg esmolol 20 mg Brevital 70 mg succinylcholine 100 mg  Post Shock:    Seizure Duration: 29 seconds EMG 61 seconds EEG   Comments: Follow-up 2 weeks  Lungs:  [x]   Clear to auscultation               []  Other:   Heart:    [x]   Regular rhythm             []  irregular rhythm    [x]   Previous H&P reviewed, patient examined and there are NO CHANGES                 []   Previous H&P reviewed, patient examined and there are changes noted.   Melinda Berthold, MD 10/23/202011:17 AM

## 2019-09-06 ENCOUNTER — Telehealth: Payer: Self-pay

## 2019-09-14 ENCOUNTER — Other Ambulatory Visit: Payer: Self-pay

## 2019-09-14 ENCOUNTER — Ambulatory Visit (INDEPENDENT_AMBULATORY_CARE_PROVIDER_SITE_OTHER): Payer: Medicare PPO | Admitting: Physician Assistant

## 2019-09-14 ENCOUNTER — Encounter: Payer: Self-pay | Admitting: Physician Assistant

## 2019-09-14 VITALS — BP 122/81 | HR 88 | Temp 96.9°F | Wt 238.8 lb

## 2019-09-14 DIAGNOSIS — E1169 Type 2 diabetes mellitus with other specified complication: Secondary | ICD-10-CM

## 2019-09-14 DIAGNOSIS — E785 Hyperlipidemia, unspecified: Secondary | ICD-10-CM

## 2019-09-14 DIAGNOSIS — E1142 Type 2 diabetes mellitus with diabetic polyneuropathy: Secondary | ICD-10-CM | POA: Diagnosis not present

## 2019-09-14 DIAGNOSIS — I10 Essential (primary) hypertension: Secondary | ICD-10-CM

## 2019-09-14 DIAGNOSIS — E1159 Type 2 diabetes mellitus with other circulatory complications: Secondary | ICD-10-CM | POA: Diagnosis not present

## 2019-09-14 DIAGNOSIS — I152 Hypertension secondary to endocrine disorders: Secondary | ICD-10-CM

## 2019-09-14 NOTE — Progress Notes (Signed)
Patient: Melinda [name] Female    DOB: [birth year]   48 y.o.   MRN: [id number] Visit Date: 09/14/2019  Today's Provider: Trinna Post, PA-C   Chief Complaint  Patient presents with  . Diabetes  . Hypertension  . Hyperlipidemia   Subjective:    I, Porsha McClurkin CMA, am acting as a Education administrator for [employer].   HPI   In the interim, her son has gotten engaged.    Diabetes Mellitus Type II, Follow-up:   Lab Results  Component Value Date   HGBA1C 7.0 (A) 05/13/2019   HGBA1C 7.1 (A) 02/11/2019   HGBA1C 6.9 (A) 10/06/2018    Last seen for diabetes 4 months ago.  Management since then includes metformin XR 500 mg BID. She reports good compliance with treatment. She is not having side effects.  Current symptoms include none and have been stable. Home blood sugar records: around 97-112 per pt.  Episodes of hypoglycemia? no   Current insulin regiment: Is not on insulin Most Recent Eye Exam: 11/2018 Weight trend: stable Prior visit with dietician: No Current exercise: none Current diet habits: well balanced  Pertinent Labs:    Component Value Date/Time   CHOL 160 10/06/2018 1035   TRIG 96 10/06/2018 1035   HDL 54 10/06/2018 1035   LDLCALC 87 10/06/2018 1035   CREATININE 0.78 11/12/2018 1013   CREATININE 0.77 05/08/2013 1404    Wt Readings from Last 3 Encounters:  09/14/19 238 lb 12.8 oz (108.3 kg)  07/05/19 246 lb (111.6 kg)  06/14/19 249 lb (112.9 kg)    ------------------------------------------------------------------------  Hypertension, follow-up:  BP Readings from Last 3 Encounters:  09/14/19 122/81  07/05/19 120/70  06/14/19 124/80    She was last seen for hypertension 4 months ago.  BP at that visit was 155/90. Management changes since that visit include none continue Lisinopril 10 mg daily. She reports good compliance with treatment. She is not having side effects.  She is not exercising. She is adherent to low salt diet.    Outside blood pressures are not checked. She is experiencing none.  Patient denies chest pain, chest pressure/discomfort, fatigue, irregular heart beat, lower extremity edema and palpitations.   Cardiovascular risk factors include diabetes mellitus and hypertension.  Use of agents associated with hypertension: none.     Weight trend: stable Wt Readings from Last 3 Encounters:  09/14/19 238 lb 12.8 oz (108.3 kg)  07/05/19 246 lb (111.6 kg)  06/14/19 249 lb (112.9 kg)    Current diet: well balanced  ------------------------------------------------------------------------  Lipid/Cholesterol, Follow-up:   Last seen for this4 months ago.   . Last Lipid Panel:    Component Value Date/Time   CHOL 160 10/06/2018 1035   TRIG 96 10/06/2018 1035   HDL 54 10/06/2018 1035   CHOLHDL 3.0 10/06/2018 1035   Export 87 10/06/2018 1035    Risk factors for vascular disease include diabetes mellitus, hypercholesterolemia and hypertension  She reports good compliance with treatment. She is not having side effects.  Current symptoms include none and have been stable. Weight trend: stable Prior visit with dietician: no Current diet: well balanced Current exercise: none  Wt Readings from Last 3 Encounters:  09/14/19 238 lb 12.8 oz (108.3 kg)  07/05/19 246 lb (111.6 kg)  06/14/19 249 lb (112.9 kg)    -------------------------------------------------------------------    Allergies  Allergen Reactions  . Prednisone     Increases blood sugar     Current Outpatient Medications:  .  atorvastatin (LIPITOR) 10 MG tablet, TAKE 1 TABLET AT BEDTIME., Disp: 90 tablet, Rfl: 1 .  buPROPion (WELLBUTRIN XL) 300 MG 24 hr tablet, Take 1 tablet (300 mg total) by mouth daily., Disp: 90 tablet, Rfl: 1 .  escitalopram (LEXAPRO) 20 MG tablet, Take 1 tablet (20 mg total) by mouth daily., Disp: 90 tablet, Rfl: 1 .  glucose blood (ACCU-CHEK AVIVA PLUS) test strip, Use as instructed, Disp: 100 each,  Rfl: 12 .  Lancets Misc. (ACCU-CHEK FASTCLIX LANCET) KIT, , Disp: , Rfl:  .  lisinopril (ZESTRIL) 10 MG tablet, TAKE 1 TABLET (10 MG TOTAL) BY MOUTH DAILY., Disp: 90 tablet, Rfl: 0 .  metFORMIN (GLUCOPHAGE-XR) 500 MG 24 hr tablet, TAKE 1 TABLET  DAILY WITH BREAKFAST FOR 14 DAYS, THEN 1 TABLET TWO TIMES DAILY., Disp: 180 tablet, Rfl: 1 .  ziprasidone (GEODON) 80 MG capsule, TAKE 1 CAPSULE TWO TIMES DAILY WITH A MEAL., Disp: 180 capsule, Rfl: 1  Review of Systems  Constitutional: Negative.   Respiratory: Negative.   Genitourinary: Negative.   Neurological: Negative.   Psychiatric/Behavioral: Negative.     Social History   Tobacco Use  . Smoking status: Never Smoker  . Smokeless tobacco: Never Used  Substance Use Topics  . Alcohol use: No      Objective:   BP 122/81 (BP Location: Left Arm, Patient Position: Sitting, Cuff Size: Large)   Pulse 88   Temp (!) 96.9 F (36.1 C) (Temporal)   Wt 238 lb 12.8 oz (108.3 kg)   LMP  (LMP Unknown) Comment: pt states she has not had period in the past year  BMI 40.99 kg/m  Vitals:   09/14/19 0811  BP: 122/81  Pulse: 88  Temp: (!) 96.9 F (36.1 C)  TempSrc: Temporal  Weight: 238 lb 12.8 oz (108.3 kg)  Body mass index is 40.99 kg/m.   Physical Exam   No results found for any visits on 09/14/19.     Assessment & Plan    1. Type 2 diabetes mellitus with diabetic polyneuropathy, without long-term current use of insulin (HCC)  Will check A1c, if uncontrolled, will increase metformin to 1000 mg BID.   - POCT glycosylated hemoglobin (Hb A1C) - Comprehensive Metabolic Panel (CMET) - HgB A1c - Lipid Profile  2. Hyperlipidemia associated with type 2 diabetes mellitus (HCC)  Continue Lipitor 10 mg QHS.   3. Hypertension associated with diabetes (HCC)  Continue Lisinopril 10 mg QD.   4. Morbid obesity ([place])  Patient has lost 8 lbs since last visit.   The entirety of the information documented in the History of Present  Illness, Review of Systems and Physical Exam were personally obtained by me. Portions of this information were initially documented by [facility], CMA and reviewed by me for thoroughness and accuracy.   F/u 3-4 months for chronic.         Trinna Post, PA-C  [facility]

## 2019-09-15 LAB — COMPREHENSIVE METABOLIC PANEL
ALT: 8 IU/L (ref 0–32)
AST: 10 IU/L (ref 0–40)
Albumin/Globulin Ratio: 1.9 (ref 1.2–2.2)
Albumin: 4.3 g/dL (ref 3.8–4.8)
Alkaline Phosphatase: 135 IU/L — ABNORMAL HIGH (ref 39–117)
BUN/Creatinine Ratio: 20 (ref 9–23)
BUN: 11 mg/dL (ref 6–24)
Bilirubin Total: <0.2 mg/dL (ref 0.0–1.2)
CO2: 21 mmol/L (ref 20–29)
Calcium: 9.6 mg/dL (ref 8.7–10.2)
Chloride: 109 mmol/L — ABNORMAL HIGH (ref 96–106)
Creatinine, Ser: 0.56 mg/dL — ABNORMAL LOW (ref 0.57–1.00)
GFR calc Af Amer: 126 mL/min/{1.73_m2} (ref >59)
GFR calc non Af Amer: 109 mL/min/{1.73_m2} (ref >59)
Globulin, Total: 2.3 g/dL (ref 1.5–4.5)
Glucose: 115 mg/dL — ABNORMAL HIGH (ref 65–99)
Potassium: 3.7 mmol/L (ref 3.5–5.2)
Sodium: 143 mmol/L (ref 134–144)
Total Protein: 6.6 g/dL (ref 6.0–8.5)

## 2019-09-15 LAB — LIPID PANEL
Chol/HDL Ratio: 2.8 ratio (ref 0.0–4.4)
Cholesterol, Total: 130 mg/dL (ref 100–199)
HDL: 47 mg/dL (ref >39)
LDL Chol Calc (NIH): 70 mg/dL (ref 0–99)
Triglycerides: 59 mg/dL (ref 0–149)
VLDL Cholesterol Cal: 13 mg/dL (ref 5–40)

## 2019-09-15 LAB — HEMOGLOBIN A1C
Est. average glucose Bld gHb Est-mCnc: 151 mg/dL
Hgb A1c MFr Bld: 6.9 % — ABNORMAL HIGH (ref 4.8–5.6)

## 2019-09-16 ENCOUNTER — Other Ambulatory Visit: Payer: Self-pay | Admitting: Psychiatry

## 2019-09-16 ENCOUNTER — Other Ambulatory Visit
Admission: RE | Admit: 2019-09-16 | Discharge: 2019-09-16 | Disposition: A | Discharge status: Home / Self Care | Payer: Medicare PPO | Source: Ambulatory Visit | Attending: Psychiatry | Admitting: Psychiatry

## 2019-09-16 ENCOUNTER — Other Ambulatory Visit: Payer: Self-pay

## 2019-09-16 DIAGNOSIS — Z20828 Contact with and (suspected) exposure to other viral communicable diseases: Secondary | ICD-10-CM | POA: Diagnosis not present

## 2019-09-16 DIAGNOSIS — Z01812 Encounter for preprocedural laboratory examination: Secondary | ICD-10-CM | POA: Diagnosis not present

## 2019-09-16 LAB — SARS CORONAVIRUS 2 (TAT 6-24 HRS): SARS Coronavirus 2: NEGATIVE

## 2019-09-17 ENCOUNTER — Encounter
Admission: RE | Admit: 2019-09-17 | Discharge: 2019-09-17 | Disposition: A | Discharge status: Home / Self Care | Payer: Medicare PPO | Source: Ambulatory Visit | Attending: Psychiatry | Admitting: Psychiatry

## 2019-09-17 ENCOUNTER — Ambulatory Visit: Payer: Self-pay | Admitting: Anesthesiology

## 2019-09-17 ENCOUNTER — Encounter: Payer: Self-pay | Admitting: Anesthesiology

## 2019-09-17 DIAGNOSIS — F339 Major depressive disorder, recurrent, unspecified: Secondary | ICD-10-CM | POA: Diagnosis not present

## 2019-09-17 DIAGNOSIS — G4730 Sleep apnea, unspecified: Secondary | ICD-10-CM | POA: Diagnosis not present

## 2019-09-17 DIAGNOSIS — E1142 Type 2 diabetes mellitus with diabetic polyneuropathy: Secondary | ICD-10-CM | POA: Diagnosis not present

## 2019-09-17 DIAGNOSIS — E1140 Type 2 diabetes mellitus with diabetic neuropathy, unspecified: Secondary | ICD-10-CM | POA: Diagnosis not present

## 2019-09-17 DIAGNOSIS — I10 Essential (primary) hypertension: Secondary | ICD-10-CM | POA: Insufficient documentation

## 2019-09-17 DIAGNOSIS — E7800 Pure hypercholesterolemia, unspecified: Secondary | ICD-10-CM | POA: Diagnosis not present

## 2019-09-17 DIAGNOSIS — F332 Major depressive disorder, recurrent severe without psychotic features: Secondary | ICD-10-CM | POA: Diagnosis not present

## 2019-09-17 LAB — GLUCOSE, CAPILLARY: Glucose-Capillary: 91 mg/dL (ref 70–99)

## 2019-09-17 MED ORDER — SUCCINYLCHOLINE CHLORIDE 200 MG/10ML IV SOSY
PREFILLED_SYRINGE | INTRAVENOUS | Status: DC | PRN
  Administered 2019-09-17: 100 mg via INTRAVENOUS

## 2019-09-17 MED ORDER — LABETALOL HCL 5 MG/ML IV SOLN
INTRAVENOUS | Status: AC
  Filled 2019-09-17: qty 4

## 2019-09-17 MED ORDER — GLYCOPYRROLATE 0.2 MG/ML IJ SOLN
INTRAMUSCULAR | Status: AC
  Administered 2019-09-17: 0.4 mg via INTRAVENOUS
  Filled 2019-09-17: qty 2

## 2019-09-17 MED ORDER — SUCCINYLCHOLINE CHLORIDE 20 MG/ML IJ SOLN
INTRAMUSCULAR | Status: AC
  Filled 2019-09-17: qty 1

## 2019-09-17 MED ORDER — GLYCOPYRROLATE 0.2 MG/ML IJ SOLN
0.4000 mg | Freq: Once | INTRAMUSCULAR | Status: AC
  Administered 2019-09-17: 09:00:00 0.4 mg via INTRAVENOUS

## 2019-09-17 MED ORDER — ESMOLOL HCL 100 MG/10ML IV SOLN
INTRAVENOUS | Status: DC | PRN
  Administered 2019-09-17: 20 mg via INTRAVENOUS

## 2019-09-17 MED ORDER — ESMOLOL HCL 100 MG/10ML IV SOLN
INTRAVENOUS | Status: AC
  Filled 2019-09-17: qty 10

## 2019-09-17 MED ORDER — SODIUM CHLORIDE 0.9 % IV SOLN
500.0000 mL | Freq: Once | INTRAVENOUS | Status: AC
  Administered 2019-09-17: 500 mL via INTRAVENOUS

## 2019-09-17 MED ORDER — KETOROLAC TROMETHAMINE 30 MG/ML IJ SOLN
INTRAMUSCULAR | Status: AC
  Administered 2019-09-17: 30 mg via INTRAVENOUS
  Filled 2019-09-17: qty 1

## 2019-09-17 MED ORDER — SODIUM CHLORIDE 0.9 % IV SOLN
INTRAVENOUS | Status: DC | PRN
  Administered 2019-09-17: 11:00:00 via INTRAVENOUS

## 2019-09-17 MED ORDER — METHOHEXITAL SODIUM 100 MG/10ML IV SOSY
PREFILLED_SYRINGE | INTRAVENOUS | Status: DC | PRN
  Administered 2019-09-17: 70 mg via INTRAVENOUS

## 2019-09-17 MED ORDER — KETOROLAC TROMETHAMINE 30 MG/ML IJ SOLN
30.0000 mg | Freq: Once | INTRAMUSCULAR | Status: AC
  Administered 2019-09-17: 09:00:00 30 mg via INTRAVENOUS

## 2019-09-17 MED ORDER — LABETALOL HCL 5 MG/ML IV SOLN
INTRAVENOUS | Status: DC | PRN
  Administered 2019-09-17: 30 mg via INTRAVENOUS

## 2019-09-17 MED ORDER — METHOHEXITAL SODIUM 0.5 G IJ SOLR
INTRAMUSCULAR | Status: AC
  Filled 2019-09-17: qty 500

## 2019-09-17 NOTE — Transfer of Care (Signed)
Immediate Anesthesia Transfer of Care Note  Patient: Melinda [name]  Procedure(s) Performed: ECT TX  Patient Location: PACU  Anesthesia Type:General  Level of Consciousness: awake  Airway & Oxygen Therapy: Patient connected to face mask oxygen  Post-op Assessment: Post -op Vital signs reviewed and stable  Post vital signs: stable  Last Vitals:  Vitals Value Taken Time  BP 132/78 09/17/19 1144  Temp    Pulse 91 09/17/19 1147  Resp 30 09/17/19 1147  SpO2 95 % 09/17/19 1147  Vitals shown include unvalidated device data.  Last Pain:  Vitals:   09/17/19 1123  TempSrc:   PainSc: Asleep         Complications: No apparent anesthesia complications

## 2019-09-17 NOTE — H&P (Signed)
Melinda [name] is an 48 y.o. female.   Chief Complaint: chronic recurrent depression  HPI: chronic depression  Past Medical History:  Diagnosis Date  . Depression   . Diabetes mellitus without complication ([name])   . Diabetic peripheral neuropathy ([name]) 03/07/15  . Diabetic peripheral neuropathy ([name]) 03/07/15  . Diabetic peripheral neuropathy ([name]) 03/07/15  . GERD (gastroesophageal reflux disease)   . Hypercholesterolemia 03/07/15  . Hypertension   . Obesity 03/07/15  . Personality disorder ([name]) 03/07/15  . Sinus tachycardia 03/07/15   history of  . Suicidal thoughts 03/07/15    Past Surgical History:  Procedure Laterality Date  . COLONOSCOPY WITH PROPOFOL N/A 12/01/2018   Procedure: COLONOSCOPY WITH PROPOFOL;  Surgeon: Jonathon Bellows, MD;  Location: [facility] ENDOSCOPY;  Service: Gastroenterology;  Laterality: N/A;  . electroconvulsion therapy  03/07/15    Family History  Problem Relation Age of Onset  . Hypertension Father   . Diabetes Mother    Social History:  reports that she has never smoked. She has never used smokeless tobacco. She reports that she does not drink alcohol or use drugs.  Allergies:  Allergies  Allergen Reactions  . Prednisone     Increases blood sugar    (Not in a hospital admission)   Results for orders placed or performed during the hospital encounter of 09/17/19 (from the past 48 hour(s))  Glucose, capillary     Status: None   Collection Time: 09/17/19  8:52 AM  Result Value Ref Range   Glucose-Capillary 91 70 - 99 mg/dL   No results found.  Review of Systems  Constitutional: Negative.   HENT: Negative.   Eyes: Negative.   Respiratory: Negative.   Cardiovascular: Negative.   Gastrointestinal: Negative.   Musculoskeletal: Negative.   Skin: Negative.   Neurological: Negative.   Psychiatric/Behavioral: Negative.     Blood pressure 97/72, pulse 96, temperature 98.1 F (36.7 C), temperature source Oral, resp. rate 18, SpO2 98 %. Physical Exam    Assessment/Plan No change to treatment plan continue q 2wk tx  Melinda Berthold, MD 09/17/2019, 10:04 AM

## 2019-09-17 NOTE — Anesthesia Postprocedure Evaluation (Signed)
Anesthesia Post Note  Patient: Melinda [name]  Procedure(s) Performed: ECT TX  Patient location during evaluation: PACU Anesthesia Type: General Level of consciousness: awake and alert Pain management: pain level controlled Vital Signs Assessment: post-procedure vital signs reviewed and stable Respiratory status: spontaneous breathing, nonlabored ventilation, respiratory function stable and patient connected to nasal cannula oxygen Cardiovascular status: blood pressure returned to baseline and stable Postop Assessment: no apparent nausea or vomiting Anesthetic complications: no     Last Vitals:  Vitals:   09/17/19 1153 09/17/19 1213  BP: 132/65 135/72  Pulse: 90 89  Resp: 19 18  Temp: (!) 36.4 C 36.7 C  SpO2: 96%     Last Pain:  Vitals:   09/17/19 1213  TempSrc: Oral  PainSc: 0-No pain                 Precious Haws Maiyah Goyne

## 2019-09-17 NOTE — Anesthesia Preprocedure Evaluation (Addendum)
Anesthesia Evaluation  Patient identified by MRN, date of birth, ID band Patient awake    Reviewed: Allergy & Precautions, H&P , NPO status , Patient's Chart, lab work & pertinent test results  History of Anesthesia Complications Negative for: history of anesthetic complications  Airway Mallampati: III  TM Distance: >3 FB Neck ROM: full    Dental  (+) Poor Dentition, Chipped   Pulmonary sleep apnea , neg COPD,           Cardiovascular hypertension, Pt. on medications (-) CAD and (-) Past MI negative cardio ROS       Neuro/Psych PSYCHIATRIC DISORDERS Depression Bipolar Disorder  Neuromuscular disease negative neurological ROS     GI/Hepatic Neg liver ROS, GERD  Medicated,  Endo/Other  diabetes, Type 2, Oral Hypoglycemic Agents  Renal/GU negative Renal ROS  negative genitourinary   Musculoskeletal   Abdominal (+) + obese,   Peds  Hematology negative hematology ROS (+)   Anesthesia Other Findings Past Medical History: No date: Depression No date: Diabetes mellitus without complication (HCC) 99991111: Diabetic peripheral neuropathy ([place]) 03/07/15: Diabetic peripheral neuropathy ([place]) 03/07/15: Diabetic peripheral neuropathy (HCC) No date: GERD (gastroesophageal reflux disease) 03/07/15: Hypercholesterolemia No date: Hypertension 03/07/15: Obesity 03/07/15: Personality disorder 03/07/15: Sinus tachycardia ([place])     Comment: history of 03/07/15: Suicidal thoughts Past Surgical History: 03/07/15: electroconvulsion therapy BMI    Body Mass Index:  36.44 kg/m     Reproductive/Obstetrics                            Anesthesia Physical  Anesthesia Plan  ASA: III  Anesthesia Plan: General   Post-op Pain Management:    Induction: Intravenous  PONV Risk Score and Plan: 2 and Ondansetron  Airway Management Planned: Mask  Additional Equipment:   Intra-op Plan:   Post-operative Plan:    Informed Consent: I have reviewed the patients History and Physical, chart, labs and discussed the procedure including the risks, benefits and alternatives for the proposed anesthesia with the patient or authorized representative who has indicated his/her understanding and acceptance.     Dental Advisory Given  Plan Discussed with: CRNA and Anesthesiologist  Anesthesia Plan Comments: (Patient consented for risks of anesthesia including but not limited to:  - adverse reactions to medications - risk of intubation if required - damage to teeth, lips or other oral mucosa - sore throat or hoarseness - Damage to heart, brain, lungs or loss of life  Patient voiced understanding.)        Anesthesia Quick Evaluation

## 2019-09-17 NOTE — Anesthesia Post-op Follow-up Note (Signed)
Anesthesia QCDR form completed.        

## 2019-09-17 NOTE — Discharge Instructions (Signed)
1)  The drugs that you have been given will stay in your system until tomorrow so for the       next 24 hours you should not:  A. Drive an automobile  B. Make any legal decisions  C. Drink any alcoholic beverages  2)  You may resume your regular meals upon return home.  3)  A responsible adult must take you home.  Someone should stay with you for a few          hours, then be available by phone for the remainder of the treatment day.  4)  You May experience any of the following symptoms:  Headache, Nausea and a dry mouth (due to the medications you were given),  temporary memory loss and some confusion, or sore muscles (a warm bath  should help this).  If you you experience any of these symptoms let us know on                your return visit.  5)  Report any of the following: any acute discomfort, severe headache, or temperature        greater than 100.5 F.   Also report any unusual redness, swelling, drainage, or pain         at your IV site.    You may report Symptoms to:  [place] at [facility]          Phone: [phone], ECT Department           or Dr. Prescott Gum office [phone]  6)  Your next ECT Treatment is Friday November 20   We will call 2 days prior to your scheduled appointment for arrival times.  7)  Nothing to eat or drink after midnight the night before your procedure.  8)  Take Lisinopril    With a sip of water the morning of your procedure.  9)  Other Instructions: Call [phone] to cancel the morning of your procedure due         to illness or emergency.  10) We will call within 72 hours to assess how you are feeling.

## 2019-09-29 ENCOUNTER — Telehealth: Payer: Self-pay

## 2019-09-30 ENCOUNTER — Other Ambulatory Visit
Admission: RE | Admit: 2019-09-30 | Discharge: 2019-09-30 | Disposition: A | Discharge status: Home / Self Care | Payer: Medicare PPO | Source: Ambulatory Visit | Attending: Psychiatry | Admitting: Psychiatry

## 2019-09-30 DIAGNOSIS — Z01812 Encounter for preprocedural laboratory examination: Secondary | ICD-10-CM | POA: Diagnosis not present

## 2019-09-30 DIAGNOSIS — Z20828 Contact with and (suspected) exposure to other viral communicable diseases: Secondary | ICD-10-CM | POA: Diagnosis not present

## 2019-09-30 LAB — SARS CORONAVIRUS 2 (TAT 6-24 HRS): SARS Coronavirus 2: NEGATIVE

## 2019-10-01 ENCOUNTER — Encounter: Payer: Self-pay | Admitting: Anesthesiology

## 2019-10-01 ENCOUNTER — Encounter (HOSPITAL_BASED_OUTPATIENT_CLINIC_OR_DEPARTMENT_OTHER)
Admission: RE | Admit: 2019-10-01 | Discharge: 2019-10-01 | Disposition: A | Discharge status: Home / Self Care | Payer: Medicare PPO | Source: Ambulatory Visit | Attending: Psychiatry | Admitting: Psychiatry

## 2019-10-01 ENCOUNTER — Other Ambulatory Visit: Payer: Self-pay

## 2019-10-01 DIAGNOSIS — E7800 Pure hypercholesterolemia, unspecified: Secondary | ICD-10-CM | POA: Diagnosis not present

## 2019-10-01 DIAGNOSIS — E1142 Type 2 diabetes mellitus with diabetic polyneuropathy: Secondary | ICD-10-CM | POA: Diagnosis not present

## 2019-10-01 DIAGNOSIS — F3110 Bipolar disorder, current episode manic without psychotic features, unspecified: Secondary | ICD-10-CM | POA: Diagnosis not present

## 2019-10-01 DIAGNOSIS — F332 Major depressive disorder, recurrent severe without psychotic features: Secondary | ICD-10-CM | POA: Diagnosis not present

## 2019-10-01 DIAGNOSIS — F339 Major depressive disorder, recurrent, unspecified: Secondary | ICD-10-CM | POA: Diagnosis not present

## 2019-10-01 DIAGNOSIS — I10 Essential (primary) hypertension: Secondary | ICD-10-CM | POA: Diagnosis not present

## 2019-10-01 DIAGNOSIS — K219 Gastro-esophageal reflux disease without esophagitis: Secondary | ICD-10-CM | POA: Diagnosis not present

## 2019-10-01 DIAGNOSIS — E1140 Type 2 diabetes mellitus with diabetic neuropathy, unspecified: Secondary | ICD-10-CM | POA: Diagnosis not present

## 2019-10-01 DIAGNOSIS — G4730 Sleep apnea, unspecified: Secondary | ICD-10-CM | POA: Diagnosis not present

## 2019-10-01 LAB — GLUCOSE, CAPILLARY: Glucose-Capillary: 105 mg/dL — ABNORMAL HIGH (ref 70–99)

## 2019-10-01 MED ORDER — SUCCINYLCHOLINE CHLORIDE 200 MG/10ML IV SOSY
PREFILLED_SYRINGE | INTRAVENOUS | Status: DC | PRN
  Administered 2019-10-01: 100 mg via INTRAVENOUS

## 2019-10-01 MED ORDER — SUCCINYLCHOLINE CHLORIDE 20 MG/ML IJ SOLN
INTRAMUSCULAR | Status: AC
  Filled 2019-10-01: qty 1

## 2019-10-01 MED ORDER — KETOROLAC TROMETHAMINE 30 MG/ML IJ SOLN
INTRAMUSCULAR | Status: AC
  Filled 2019-10-01: qty 1

## 2019-10-01 MED ORDER — LABETALOL HCL 5 MG/ML IV SOLN
INTRAVENOUS | Status: DC | PRN
  Administered 2019-10-01: 30 mg via INTRAVENOUS

## 2019-10-01 MED ORDER — METHOHEXITAL SODIUM 100 MG/10ML IV SOSY
PREFILLED_SYRINGE | INTRAVENOUS | Status: DC | PRN
  Administered 2019-10-01: 70 mg via INTRAVENOUS

## 2019-10-01 MED ORDER — KETOROLAC TROMETHAMINE 30 MG/ML IJ SOLN
30.0000 mg | Freq: Once | INTRAMUSCULAR | Status: AC
  Administered 2019-10-01: 30 mg via INTRAVENOUS

## 2019-10-01 MED ORDER — LABETALOL HCL 5 MG/ML IV SOLN
INTRAVENOUS | Status: AC
  Filled 2019-10-01: qty 4

## 2019-10-01 MED ORDER — SODIUM CHLORIDE 0.9 % IV SOLN
500.0000 mL | Freq: Once | INTRAVENOUS | Status: AC
  Administered 2019-10-01: 500 mL via INTRAVENOUS

## 2019-10-01 MED ORDER — GLYCOPYRROLATE 0.2 MG/ML IJ SOLN
0.4000 mg | Freq: Once | INTRAMUSCULAR | Status: AC
  Administered 2019-10-01: 0.4 mg via INTRAVENOUS

## 2019-10-01 MED ORDER — GLYCOPYRROLATE 0.2 MG/ML IJ SOLN
INTRAMUSCULAR | Status: AC
  Filled 2019-10-01: qty 2

## 2019-10-01 MED ORDER — SODIUM CHLORIDE 0.9 % IV SOLN
INTRAVENOUS | Status: DC | PRN
  Administered 2019-10-01: 10:00:00 via INTRAVENOUS

## 2019-10-01 MED ORDER — ESMOLOL HCL 100 MG/10ML IV SOLN
INTRAVENOUS | Status: AC
  Filled 2019-10-01: qty 10

## 2019-10-01 MED ORDER — ESMOLOL HCL 100 MG/10ML IV SOLN
INTRAVENOUS | Status: DC | PRN
  Administered 2019-10-01: 20 mg via INTRAVENOUS

## 2019-10-01 NOTE — Anesthesia Post-op Follow-up Note (Signed)
Anesthesia QCDR form completed.        

## 2019-10-01 NOTE — Anesthesia Postprocedure Evaluation (Signed)
Anesthesia Post Note  Patient: Melinda [name]  Procedure(s) Performed: ECT TX  Patient location during evaluation: PACU Anesthesia Type: General Level of consciousness: awake and alert and oriented Pain management: pain level controlled Vital Signs Assessment: post-procedure vital signs reviewed and stable Respiratory status: spontaneous breathing, nonlabored ventilation and respiratory function stable Cardiovascular status: blood pressure returned to baseline and stable Postop Assessment: no signs of nausea or vomiting Anesthetic complications: no     Last Vitals:  Vitals:   10/01/19 1119 10/01/19 1120  BP: (!) 151/94   Pulse: (!) 104 100  Resp: 18 18  Temp:  37.1 C  SpO2: 100%     Last Pain:  Vitals:   10/01/19 1120  TempSrc: Oral  PainSc: 0-No pain                 Andersyn Fragoso

## 2019-10-01 NOTE — Procedures (Signed)
ECT SERVICES Physician's Interval Evaluation & Treatment Note  Patient Identification: Melinda [name] MRN:  [id number] Date of Evaluation:  10/01/2019 TX #: [id number]  MADRS:   MMSE:   P.E. Findings:  No change to physical  Psychiatric Interval Note:  Mood stable  Subjective:  Patient is a 47 y.o. female seen for evaluation for Electroconvulsive Therapy. No specific change  Treatment Summary:   []   Right Unilateral             [x]  Bilateral   % Energy : 1.0 ms 35%   Impedance: 1200 ohms  Seizure Energy Index: No reading for some reason the computer is not picking up on the EEG seizures today.  It was a quite robust seizure as usual  Postictal Suppression Index: Also no reading for this although there was clear postictal suppression  Seizure Concordance Index: 92%  Medications  Pre Shock: Robinul 0.4 mg Toradol 30 mg labetalol 20 mg esmolol 20 mg Brevital 70 mg succinylcholine 100 mg  Post Shock:    Seizure Duration: 27 seconds EMG 61 seconds EEG   Comments: Follow-up in 2 weeks as is the usual schedule  Lungs:  [x]   Clear to auscultation               []  Other:   Heart:    [x]   Regular rhythm             []  irregular rhythm    [x]   Previous H&P reviewed, patient examined and there are NO CHANGES                 []   Previous H&P reviewed, patient examined and there are changes noted.   [name], MD 11/20/202010:32 AM

## 2019-10-01 NOTE — Anesthesia Procedure Notes (Signed)
Date/Time: 10/01/2019 10:38 AM Performed by: Dionne Bucy, CRNA Pre-anesthesia Checklist: Patient identified, Emergency Drugs available, Suction available and Patient being monitored Patient Re-evaluated:Patient Re-evaluated prior to induction Oxygen Delivery Method: Circle system utilized Preoxygenation: Pre-oxygenation with 100% oxygen Induction Type: IV induction Ventilation: Mask ventilation without difficulty and Mask ventilation throughout procedure Airway Equipment and Method: Bite block Placement Confirmation: positive ETCO2 Dental Injury: Teeth and Oropharynx as per pre-operative assessment

## 2019-10-01 NOTE — Discharge Instructions (Signed)
1)  The drugs that you have been given will stay in your system until tomorrow so for the       next 24 hours you should not:  A. Drive an automobile  B. Make any legal decisions  C. Drink any alcoholic beverages  2)  You may resume your regular meals upon return home.  3)  A responsible adult must take you home.  Someone should stay with you for a few          hours, then be available by phone for the remainder of the treatment day.  4)  You May experience any of the following symptoms:  Headache, Nausea and a dry mouth (due to the medications you were given),  temporary memory loss and some confusion, or sore muscles (a warm bath  should help this).  If you you experience any of these symptoms let us know on                your return visit.  5)  Report any of the following: any acute discomfort, severe headache, or temperature        greater than 100.5 F.   Also report any unusual redness, swelling, drainage, or pain         at your IV site.    You may report Symptoms to:  [name] at [facility]          Phone: [phone], ECT Department           or Dr. Prescott Gum office [phone]  6)  Your next ECT Treatment is Friday December 4   We will call 2 days prior to your scheduled appointment for arrival times.  7)  Nothing to eat or drink after midnight the night before your procedure.  8)  Take     With a sip of water the morning of your procedure.  9)  Other Instructions: Call [phone] to cancel the morning of your procedure due         to illness or emergency.  10) We will call within 72 hours to assess how you are feeling.

## 2019-10-01 NOTE — Transfer of Care (Signed)
Immediate Anesthesia Transfer of Care Note  Patient: Melinda [name]  Procedure(s) Performed: ECT TX  Patient Location: PACU  Anesthesia Type:General  Level of Consciousness: sedated  Airway & Oxygen Therapy: Patient Spontanous Breathing and Patient connected to face mask oxygen  Post-op Assessment: Report given to RN and Post -op Vital signs reviewed and stable  Post vital signs: Reviewed and stable  Last Vitals:  Vitals Value Taken Time  BP 135/90 10/01/19 1050  Temp 37.3 C 10/01/19 1050  Pulse 110 10/01/19 1050  Resp 18 10/01/19 1050  SpO2 96 % 10/01/19 1050    Last Pain:  Vitals:   10/01/19 0830  TempSrc:   PainSc: 0-No pain         Complications: No apparent anesthesia complications

## 2019-10-01 NOTE — Anesthesia Preprocedure Evaluation (Signed)
Anesthesia Evaluation  Patient identified by MRN, date of birth, ID band Patient awake    Reviewed: Allergy & Precautions, H&P , NPO status , Patient's Chart, lab work & pertinent test results  History of Anesthesia Complications Negative for: history of anesthetic complications  Airway Mallampati: II  TM Distance: >3 FB Neck ROM: full    Dental  (+) Poor Dentition, Chipped   Pulmonary sleep apnea , neg COPD,    Pulmonary exam normal breath sounds clear to auscultation       Cardiovascular hypertension, Pt. on medications (-) CAD and (-) Past MI negative cardio ROS Normal cardiovascular exam Rhythm:regular Rate:Normal     Neuro/Psych PSYCHIATRIC DISORDERS Depression Bipolar Disorder  Neuromuscular disease negative neurological ROS     GI/Hepatic negative GI ROS, Neg liver ROS, GERD  Medicated,  Endo/Other  diabetes, Type 2, Oral Hypoglycemic Agents  Renal/GU negative Renal ROS  negative genitourinary   Musculoskeletal   Abdominal (+) + obese,   Peds  Hematology negative hematology ROS (+)   Anesthesia Other Findings Past Medical History: No date: Depression No date: Diabetes mellitus without complication (HCC) 2/56/38: Diabetic peripheral neuropathy ([place]) 03/07/15: Diabetic peripheral neuropathy ([place]) 03/07/15: Diabetic peripheral neuropathy (HCC) No date: GERD (gastroesophageal reflux disease) 03/07/15: Hypercholesterolemia No date: Hypertension 03/07/15: Obesity 03/07/15: Personality disorder 03/07/15: Sinus tachycardia ([place])     Comment: history of 03/07/15: Suicidal thoughts Past Surgical History: 03/07/15: electroconvulsion therapy BMI    Body Mass Index:  36.44 kg/m     Reproductive/Obstetrics                             Anesthesia Physical  Anesthesia Plan  ASA: III  Anesthesia Plan: General   Post-op Pain Management:    Induction: Intravenous  PONV Risk Score and  Plan: 2 and Ondansetron  Airway Management Planned: Mask  Additional Equipment:   Intra-op Plan:   Post-operative Plan:   Informed Consent: I have reviewed the patients History and Physical, chart, labs and discussed the procedure including the risks, benefits and alternatives for the proposed anesthesia with the patient or authorized representative who has indicated his/her understanding and acceptance.     Dental Advisory Given  Plan Discussed with: CRNA and Anesthesiologist  Anesthesia Plan Comments:         Anesthesia Quick Evaluation

## 2019-10-08 ENCOUNTER — Other Ambulatory Visit: Payer: Self-pay | Admitting: Physician Assistant

## 2019-10-13 ENCOUNTER — Telehealth: Payer: Self-pay

## 2019-10-14 ENCOUNTER — Encounter: Payer: Self-pay | Admitting: Certified Registered Nurse Anesthetist

## 2019-10-14 ENCOUNTER — Other Ambulatory Visit: Payer: Self-pay

## 2019-10-14 ENCOUNTER — Other Ambulatory Visit: Payer: Self-pay | Admitting: Psychiatry

## 2019-10-14 ENCOUNTER — Other Ambulatory Visit
Admission: RE | Admit: 2019-10-14 | Discharge: 2019-10-14 | Disposition: A | Discharge status: Home / Self Care | Payer: Medicare PPO | Source: Ambulatory Visit | Attending: Psychiatry | Admitting: Psychiatry

## 2019-10-14 DIAGNOSIS — Z20828 Contact with and (suspected) exposure to other viral communicable diseases: Secondary | ICD-10-CM | POA: Insufficient documentation

## 2019-10-14 DIAGNOSIS — Z01812 Encounter for preprocedural laboratory examination: Secondary | ICD-10-CM | POA: Insufficient documentation

## 2019-10-14 LAB — SARS CORONAVIRUS 2 (TAT 6-24 HRS): SARS Coronavirus 2: NEGATIVE

## 2019-10-15 ENCOUNTER — Ambulatory Visit
Admission: RE | Admit: 2019-10-15 | Discharge: 2019-10-15 | Disposition: A | Discharge status: Home / Self Care | Payer: Medicare PPO | Source: Ambulatory Visit | Attending: Physician Assistant | Admitting: Physician Assistant

## 2019-10-15 ENCOUNTER — Telehealth: Payer: Self-pay

## 2019-10-15 DIAGNOSIS — E669 Obesity, unspecified: Secondary | ICD-10-CM | POA: Diagnosis not present

## 2019-10-15 DIAGNOSIS — I10 Essential (primary) hypertension: Secondary | ICD-10-CM | POA: Diagnosis not present

## 2019-10-15 DIAGNOSIS — E7800 Pure hypercholesterolemia, unspecified: Secondary | ICD-10-CM | POA: Insufficient documentation

## 2019-10-15 DIAGNOSIS — F329 Major depressive disorder, single episode, unspecified: Secondary | ICD-10-CM | POA: Insufficient documentation

## 2019-10-15 DIAGNOSIS — F609 Personality disorder, unspecified: Secondary | ICD-10-CM | POA: Diagnosis not present

## 2019-10-15 DIAGNOSIS — K219 Gastro-esophageal reflux disease without esophagitis: Secondary | ICD-10-CM | POA: Insufficient documentation

## 2019-10-15 DIAGNOSIS — E1142 Type 2 diabetes mellitus with diabetic polyneuropathy: Secondary | ICD-10-CM | POA: Insufficient documentation

## 2019-10-15 LAB — GLUCOSE, CAPILLARY: Glucose-Capillary: 113 mg/dL — ABNORMAL HIGH (ref 70–99)

## 2019-10-15 MED ORDER — KETOROLAC TROMETHAMINE 30 MG/ML IJ SOLN
30.0000 mg | Freq: Once | INTRAMUSCULAR | Status: AC
  Administered 2019-10-15: 09:00:00 30 mg via INTRAVENOUS

## 2019-10-15 MED ORDER — GLYCOPYRROLATE 0.2 MG/ML IJ SOLN
0.4000 mg | Freq: Once | INTRAMUSCULAR | Status: AC
  Administered 2019-10-15: 09:00:00 0.4 mg via INTRAVENOUS

## 2019-10-15 MED ORDER — KETOROLAC TROMETHAMINE 30 MG/ML IJ SOLN
INTRAMUSCULAR | Status: AC
  Administered 2019-10-15: 30 mg via INTRAVENOUS
  Filled 2019-10-15: qty 1

## 2019-10-15 MED ORDER — SODIUM CHLORIDE 0.9 % IV SOLN
500.0000 mL | Freq: Once | INTRAVENOUS | Status: AC
  Administered 2019-10-15: 500 mL via INTRAVENOUS

## 2019-10-15 MED ORDER — GLYCOPYRROLATE 0.2 MG/ML IJ SOLN
INTRAMUSCULAR | Status: AC
  Administered 2019-10-15: 0.4 mg via INTRAVENOUS
  Filled 2019-10-15: qty 2

## 2019-10-15 NOTE — Anesthesia Preprocedure Evaluation (Deleted)
Anesthesia Evaluation  Patient identified by MRN, date of birth, ID band Patient awake    Reviewed: Allergy & Precautions, H&P , NPO status , Patient's Chart, lab work & pertinent test results  History of Anesthesia Complications Negative for: history of anesthetic complications  Airway Mallampati: II  TM Distance: >3 FB Neck ROM: full    Dental  (+) Poor Dentition, Chipped   Pulmonary sleep apnea , neg COPD,    Pulmonary exam normal breath sounds clear to auscultation       Cardiovascular hypertension, Pt. on medications (-) CAD and (-) Past MI negative cardio ROS Normal cardiovascular exam Rhythm:regular Rate:Normal     Neuro/Psych PSYCHIATRIC DISORDERS Depression Bipolar Disorder  Neuromuscular disease negative neurological ROS     GI/Hepatic negative GI ROS, Neg liver ROS, GERD  Medicated,  Endo/Other  diabetes, Type 2, Oral Hypoglycemic Agents  Renal/GU negative Renal ROS  negative genitourinary   Musculoskeletal   Abdominal (+) + obese,   Peds  Hematology negative hematology ROS (+)   Anesthesia Other Findings Past Medical History: No date: Depression No date: Diabetes mellitus without complication (HCC) 2/56/38: Diabetic peripheral neuropathy ([place]) 03/07/15: Diabetic peripheral neuropathy ([place]) 03/07/15: Diabetic peripheral neuropathy (HCC) No date: GERD (gastroesophageal reflux disease) 03/07/15: Hypercholesterolemia No date: Hypertension 03/07/15: Obesity 03/07/15: Personality disorder 03/07/15: Sinus tachycardia ([place])     Comment: history of 03/07/15: Suicidal thoughts Past Surgical History: 03/07/15: electroconvulsion therapy BMI    Body Mass Index:  36.44 kg/m     Reproductive/Obstetrics                             Anesthesia Physical  Anesthesia Plan  ASA: III  Anesthesia Plan: General   Post-op Pain Management:    Induction: Intravenous  PONV Risk Score and  Plan: 2 and Ondansetron  Airway Management Planned: Mask  Additional Equipment:   Intra-op Plan:   Post-operative Plan:   Informed Consent: I have reviewed the patients History and Physical, chart, labs and discussed the procedure including the risks, benefits and alternatives for the proposed anesthesia with the patient or authorized representative who has indicated his/her understanding and acceptance.     Dental Advisory Given  Plan Discussed with: CRNA and Anesthesiologist  Anesthesia Plan Comments:         Anesthesia Quick Evaluation

## 2019-10-16 ENCOUNTER — Other Ambulatory Visit: Payer: Self-pay | Admitting: Psychiatry

## 2019-10-18 ENCOUNTER — Encounter
Admission: RE | Admit: 2019-10-18 | Discharge: 2019-10-18 | Disposition: A | Discharge status: Home / Self Care | Payer: Medicare PPO | Source: Ambulatory Visit | Attending: Psychiatry | Admitting: Psychiatry

## 2019-10-18 ENCOUNTER — Encounter: Payer: Self-pay | Admitting: Anesthesiology

## 2019-10-18 ENCOUNTER — Other Ambulatory Visit: Payer: Self-pay

## 2019-10-18 DIAGNOSIS — Z8249 Family history of ischemic heart disease and other diseases of the circulatory system: Secondary | ICD-10-CM | POA: Insufficient documentation

## 2019-10-18 DIAGNOSIS — K219 Gastro-esophageal reflux disease without esophagitis: Secondary | ICD-10-CM | POA: Diagnosis not present

## 2019-10-18 DIAGNOSIS — Z915 Personal history of self-harm: Secondary | ICD-10-CM | POA: Diagnosis not present

## 2019-10-18 DIAGNOSIS — I10 Essential (primary) hypertension: Secondary | ICD-10-CM | POA: Insufficient documentation

## 2019-10-18 DIAGNOSIS — Z833 Family history of diabetes mellitus: Secondary | ICD-10-CM | POA: Diagnosis not present

## 2019-10-18 DIAGNOSIS — Z6841 Body Mass Index (BMI) 40.0 and over, adult: Secondary | ICD-10-CM | POA: Insufficient documentation

## 2019-10-18 DIAGNOSIS — F332 Major depressive disorder, recurrent severe without psychotic features: Secondary | ICD-10-CM | POA: Diagnosis not present

## 2019-10-18 DIAGNOSIS — Z888 Allergy status to other drugs, medicaments and biological substances status: Secondary | ICD-10-CM | POA: Insufficient documentation

## 2019-10-18 DIAGNOSIS — E1142 Type 2 diabetes mellitus with diabetic polyneuropathy: Secondary | ICD-10-CM | POA: Insufficient documentation

## 2019-10-18 DIAGNOSIS — E119 Type 2 diabetes mellitus without complications: Secondary | ICD-10-CM | POA: Diagnosis not present

## 2019-10-18 DIAGNOSIS — F329 Major depressive disorder, single episode, unspecified: Secondary | ICD-10-CM | POA: Insufficient documentation

## 2019-10-18 DIAGNOSIS — G4730 Sleep apnea, unspecified: Secondary | ICD-10-CM | POA: Diagnosis not present

## 2019-10-18 LAB — GLUCOSE, CAPILLARY: Glucose-Capillary: 111 mg/dL — ABNORMAL HIGH (ref 70–99)

## 2019-10-18 MED ORDER — KETOROLAC TROMETHAMINE 30 MG/ML IJ SOLN
INTRAMUSCULAR | Status: AC
  Administered 2019-10-18: 09:00:00 30 mg via INTRAVENOUS
  Filled 2019-10-18: qty 1

## 2019-10-18 MED ORDER — LABETALOL HCL 5 MG/ML IV SOLN
INTRAVENOUS | Status: AC
  Filled 2019-10-18: qty 4

## 2019-10-18 MED ORDER — GLYCOPYRROLATE 0.2 MG/ML IJ SOLN
0.4000 mg | Freq: Once | INTRAMUSCULAR | Status: AC
  Administered 2019-10-18: 09:00:00 0.4 mg via INTRAVENOUS

## 2019-10-18 MED ORDER — LABETALOL HCL 5 MG/ML IV SOLN
INTRAVENOUS | Status: DC | PRN
  Administered 2019-10-18: 30 mg via INTRAVENOUS

## 2019-10-18 MED ORDER — GLYCOPYRROLATE 0.2 MG/ML IJ SOLN
INTRAMUSCULAR | Status: AC
  Administered 2019-10-18: 09:00:00 0.4 mg via INTRAVENOUS
  Filled 2019-10-18: qty 2

## 2019-10-18 MED ORDER — METHOHEXITAL SODIUM 100 MG/10ML IV SOSY
PREFILLED_SYRINGE | INTRAVENOUS | Status: DC | PRN
  Administered 2019-10-18: 70 mg via INTRAVENOUS

## 2019-10-18 MED ORDER — SODIUM CHLORIDE 0.9 % IV SOLN
500.0000 mL | Freq: Once | INTRAVENOUS | Status: AC
  Administered 2019-10-18: 500 mL via INTRAVENOUS

## 2019-10-18 MED ORDER — SUCCINYLCHOLINE CHLORIDE 20 MG/ML IJ SOLN
INTRAMUSCULAR | Status: DC | PRN
  Administered 2019-10-18: 100 mg via INTRAVENOUS

## 2019-10-18 MED ORDER — SUCCINYLCHOLINE CHLORIDE 20 MG/ML IJ SOLN
INTRAMUSCULAR | Status: AC
  Filled 2019-10-18: qty 1

## 2019-10-18 MED ORDER — METHOHEXITAL SODIUM 0.5 G IJ SOLR
INTRAMUSCULAR | Status: AC
  Filled 2019-10-18: qty 500

## 2019-10-18 MED ORDER — ESMOLOL HCL 100 MG/10ML IV SOLN
INTRAVENOUS | Status: DC | PRN
  Administered 2019-10-18: 20 mg via INTRAVENOUS

## 2019-10-18 MED ORDER — SODIUM CHLORIDE 0.9 % IV SOLN
INTRAVENOUS | Status: DC | PRN
  Administered 2019-10-18: 11:00:00 via INTRAVENOUS

## 2019-10-18 MED ORDER — ONDANSETRON HCL 4 MG/2ML IJ SOLN: 4.0000 mg | Freq: Once | INTRAMUSCULAR | Status: DC | PRN

## 2019-10-18 MED ORDER — FENTANYL CITRATE (PF) 100 MCG/2ML IJ SOLN: 25.0000 ug | INTRAMUSCULAR | Status: DC | PRN

## 2019-10-18 MED ORDER — ESMOLOL HCL 100 MG/10ML IV SOLN
INTRAVENOUS | Status: AC
  Filled 2019-10-18: qty 10

## 2019-10-18 MED ORDER — KETOROLAC TROMETHAMINE 30 MG/ML IJ SOLN
30.0000 mg | Freq: Once | INTRAMUSCULAR | Status: AC
  Administered 2019-10-18: 09:00:00 30 mg via INTRAVENOUS

## 2019-10-18 NOTE — Anesthesia Preprocedure Evaluation (Signed)
Anesthesia Evaluation  Patient identified by MRN, date of birth, ID band Patient awake    Reviewed: Allergy & Precautions, NPO status , Patient's Chart, lab work & pertinent test results, reviewed documented beta blocker date and time   Airway Mallampati: III  TM Distance: >3 FB     Dental  (+) Chipped   Pulmonary sleep apnea ,           Cardiovascular hypertension, Pt. on medications      Neuro/Psych PSYCHIATRIC DISORDERS Depression Bipolar Disorder  Neuromuscular disease    GI/Hepatic GERD  Controlled,  Endo/Other  diabetes, Type 2  Renal/GU      Musculoskeletal   Abdominal   Peds  Hematology   Anesthesia Other Findings Obese.  Reproductive/Obstetrics                             Anesthesia Physical Anesthesia Plan  ASA: III  Anesthesia Plan: General   Post-op Pain Management:    Induction: Intravenous  PONV Risk Score and Plan:   Airway Management Planned:   Additional Equipment:   Intra-op Plan:   Post-operative Plan:   Informed Consent: I have reviewed the patients History and Physical, chart, labs and discussed the procedure including the risks, benefits and alternatives for the proposed anesthesia with the patient or authorized representative who has indicated his/her understanding and acceptance.       Plan Discussed with: CRNA  Anesthesia Plan Comments:         Anesthesia Quick Evaluation

## 2019-10-18 NOTE — Anesthesia Postprocedure Evaluation (Signed)
Anesthesia Post Note  Patient: Melinda [name]  Procedure(s) Performed: ECT TX  Patient location during evaluation: PACU Anesthesia Type: General Level of consciousness: awake and alert Pain management: pain level controlled Vital Signs Assessment: post-procedure vital signs reviewed and stable Respiratory status: spontaneous breathing, nonlabored ventilation, respiratory function stable and patient connected to nasal cannula oxygen Cardiovascular status: blood pressure returned to baseline and stable Postop Assessment: no apparent nausea or vomiting Anesthetic complications: no     Last Vitals:  Vitals:   10/18/19 1143 10/18/19 1200  BP: (!) 135/91   Pulse: 89 80  Resp: 16 16  Temp: 36.6 C 36.6 C  SpO2: 94%     Last Pain:  Vitals:   10/18/19 1200  TempSrc: Oral  PainSc: 0-No pain                 Zymire Turnbo S

## 2019-10-18 NOTE — Procedures (Signed)
ECT SERVICES Physician's Interval Evaluation & Treatment Note  Patient Identification: Melinda [name] MRN:  [id number] Date of Evaluation:  10/18/2019 TX #: [id number]  MADRS:   MMSE:   P.E. Findings:  No change to physical exam  Psychiatric Interval Note:  Sad anxious dysphoric.  Subjective:  Patient is a 48 y.o. female seen for evaluation for Electroconvulsive Therapy. Patient feeling down.  We had a chat for a while before treatment.  No suicidal thoughts no psychosis.  Treatment Summary:   []   Right Unilateral             [x]  Bilateral   % Energy : 1.0 ms 35%   Impedance: 1420 ohms  Seizure Energy Index: 5853 V squared  Postictal Suppression Index: 81%  Seizure Concordance Index: 96%  Medications  Pre Shock: Robinul 0.4 mg Toradol 30 mg Brevital 70 mg esmolol 20 mg labetalol 20 mg succinylcholine 100 mg  Post Shock:    Seizure Duration: 43 seconds EMG 88 seconds EEG   Comments: Stable treatment plan.  Follow-up 2 weeks.  Lungs:  [x]   Clear to auscultation               []  Other:   Heart:    [x]   Regular rhythm             []  irregular rhythm    [x]   Previous H&P reviewed, patient examined and there are NO CHANGES                 []   Previous H&P reviewed, patient examined and there are changes noted.   [name], MD 12/7/20205:24 PM

## 2019-10-18 NOTE — Anesthesia Post-op Follow-up Note (Signed)
Anesthesia QCDR form completed.        

## 2019-10-18 NOTE — H&P (Signed)
Melinda [name] is an 48 y.o. female.   Chief Complaint: chronic depression about stable HPI: chronic depression with good response to ECT  Past Medical History:  Diagnosis Date  . Depression   . Diabetes mellitus without complication ([place])   . Diabetic peripheral neuropathy ([place]) 03/07/15  . Diabetic peripheral neuropathy ([place]) 03/07/15  . Diabetic peripheral neuropathy ([place]) 03/07/15  . GERD (gastroesophageal reflux disease)   . Hypercholesterolemia 03/07/15  . Hypertension   . Obesity 03/07/15  . Personality disorder ([place]) 03/07/15  . Sinus tachycardia 03/07/15   history of  . Suicidal thoughts 03/07/15    Past Surgical History:  Procedure Laterality Date  . COLONOSCOPY WITH PROPOFOL N/A 12/01/2018   Procedure: COLONOSCOPY WITH PROPOFOL;  Surgeon: Jonathon Bellows, MD;  Location: [facility] ENDOSCOPY;  Service: Gastroenterology;  Laterality: N/A;  . electroconvulsion therapy  03/07/15    Family History  Problem Relation Age of Onset  . Hypertension Father   . Diabetes Mother    Social History:  reports that she has never smoked. She has never used smokeless tobacco. She reports that she does not drink alcohol or use drugs.  Allergies:  Allergies  Allergen Reactions  . Prednisone     Increases blood sugar    (Not in a hospital admission)   No results found for this or any previous visit (from the past 48 hour(s)). No results found.  Review of Systems  Constitutional: Negative.   HENT: Negative.   Eyes: Negative.   Respiratory: Negative.   Cardiovascular: Negative.   Gastrointestinal: Negative.   Musculoskeletal: Negative.   Skin: Negative.   Neurological: Negative.   Psychiatric/Behavioral: Positive for depression. Negative for hallucinations, memory loss, substance abuse and suicidal ideas. The patient is not nervous/anxious and does not have insomnia.     Blood pressure (!) 150/86, pulse 86, temperature 99.1 F (37.3 C), temperature source Oral, resp. rate 16, height 5'  3" (1.6 m), weight 106.6 kg, SpO2 99 %. Physical Exam  Constitutional: She appears well-developed and well-nourished.  HENT:  Head: Normocephalic and atraumatic.  Eyes: Pupils are equal, round, and reactive to light. Conjunctivae are normal.  Neck: Normal range of motion.  Cardiovascular: Normal heart sounds.  Respiratory: Effort normal.  GI: Soft.  Musculoskeletal: Normal range of motion.  Neurological: She is alert.  Skin: Skin is warm and dry.  Psychiatric: She has a normal mood and affect. Her behavior is normal. Judgment and thought content normal.     Assessment/Plan Continue q2 schedule  [name], MD 10/18/2019, 11:02 AM

## 2019-10-18 NOTE — Transfer of Care (Signed)
Immediate Anesthesia Transfer of Care Note  Patient: Melinda [name]  Procedure(s) Performed: ECT TX  Patient Location: PACU  Anesthesia Type:General  Level of Consciousness: drowsy and cooperative  Airway & Oxygen Therapy: Patient Spontanous Breathing  Post-op Assessment: Report given to RN and Post -op Vital signs reviewed and stable  Post vital signs: Reviewed and stable  Last Vitals:  Vitals Value Taken Time  BP 138/89 10/18/19 1123  Temp 36.8 C 10/18/19 1123  Pulse 93 10/18/19 1127  Resp 12 10/18/19 1127  SpO2 91 % 10/18/19 1127  Vitals shown include unvalidated device data.  Last Pain:  Vitals:   10/18/19 1123  TempSrc:   PainSc: Asleep         Complications: No apparent anesthesia complications

## 2019-10-18 NOTE — Discharge Instructions (Signed)
1)  The drugs that you have been given will stay in your system until tomorrow so for the       next 24 hours you should not:  A. Drive an automobile  B. Make any legal decisions  C. Drink any alcoholic beverages  2)  You may resume your regular meals upon return home.  3)  A responsible adult must take you home.  Someone should stay with you for a few          hours, then be available by phone for the remainder of the treatment day.  4)  You May experience any of the following symptoms:  Headache, Nausea and a dry mouth (due to the medications you were given),  temporary memory loss and some confusion, or sore muscles (a warm bath  should help this).  If you you experience any of these symptoms let us know on                your return visit.  5)  Report any of the following: any acute discomfort, severe headache, or temperature        greater than 100.5 F.   Also report any unusual redness, swelling, drainage, or pain         at your IV site.    You may report Symptoms to:  [place] at [facility]          Phone: [phone], ECT Department           or Dr. Prescott Gum office [phone]  6)  Your next ECT Treatment is Friday 18 at 8:30   We will call 2 days prior to your scheduled appointment for arrival times.  7)  Nothing to eat or drink after midnight the night before your procedure.  8)  Take morning meds with a sip of water the morning of your procedure.  9)  Other Instructions: Call [phone] to cancel the morning of your procedure due         to illness or emergency.  10) We will call within 72 hours to assess how you are feeling.

## 2019-10-22 ENCOUNTER — Telehealth: Payer: Self-pay

## 2019-10-27 ENCOUNTER — Other Ambulatory Visit: Payer: Self-pay

## 2019-10-27 ENCOUNTER — Other Ambulatory Visit
Admission: RE | Admit: 2019-10-27 | Discharge: 2019-10-27 | Disposition: A | Discharge status: Home / Self Care | Payer: Medicare PPO | Source: Ambulatory Visit | Attending: Psychiatry | Admitting: Psychiatry

## 2019-10-27 DIAGNOSIS — Z01812 Encounter for preprocedural laboratory examination: Secondary | ICD-10-CM | POA: Diagnosis not present

## 2019-10-27 DIAGNOSIS — Z20828 Contact with and (suspected) exposure to other viral communicable diseases: Secondary | ICD-10-CM | POA: Insufficient documentation

## 2019-10-27 LAB — SARS CORONAVIRUS 2 (TAT 6-24 HRS): SARS Coronavirus 2: NEGATIVE

## 2019-10-28 ENCOUNTER — Other Ambulatory Visit: Payer: Self-pay | Admitting: Psychiatry

## 2019-10-29 ENCOUNTER — Encounter: Payer: Self-pay | Admitting: Certified Registered Nurse Anesthetist

## 2019-10-29 ENCOUNTER — Encounter (HOSPITAL_BASED_OUTPATIENT_CLINIC_OR_DEPARTMENT_OTHER)
Admission: RE | Admit: 2019-10-29 | Discharge: 2019-10-29 | Disposition: A | Discharge status: Home / Self Care | Payer: Medicare PPO | Source: Ambulatory Visit | Attending: Psychiatry | Admitting: Psychiatry

## 2019-10-29 ENCOUNTER — Other Ambulatory Visit: Payer: Self-pay

## 2019-10-29 DIAGNOSIS — Z888 Allergy status to other drugs, medicaments and biological substances status: Secondary | ICD-10-CM | POA: Diagnosis not present

## 2019-10-29 DIAGNOSIS — Z915 Personal history of self-harm: Secondary | ICD-10-CM | POA: Diagnosis not present

## 2019-10-29 DIAGNOSIS — K219 Gastro-esophageal reflux disease without esophagitis: Secondary | ICD-10-CM | POA: Diagnosis not present

## 2019-10-29 DIAGNOSIS — F332 Major depressive disorder, recurrent severe without psychotic features: Secondary | ICD-10-CM

## 2019-10-29 DIAGNOSIS — I10 Essential (primary) hypertension: Secondary | ICD-10-CM | POA: Diagnosis not present

## 2019-10-29 DIAGNOSIS — Z8249 Family history of ischemic heart disease and other diseases of the circulatory system: Secondary | ICD-10-CM | POA: Diagnosis not present

## 2019-10-29 DIAGNOSIS — E785 Hyperlipidemia, unspecified: Secondary | ICD-10-CM | POA: Diagnosis not present

## 2019-10-29 DIAGNOSIS — Z6841 Body Mass Index (BMI) 40.0 and over, adult: Secondary | ICD-10-CM | POA: Diagnosis not present

## 2019-10-29 DIAGNOSIS — Z833 Family history of diabetes mellitus: Secondary | ICD-10-CM | POA: Diagnosis not present

## 2019-10-29 DIAGNOSIS — E1142 Type 2 diabetes mellitus with diabetic polyneuropathy: Secondary | ICD-10-CM | POA: Diagnosis not present

## 2019-10-29 DIAGNOSIS — F329 Major depressive disorder, single episode, unspecified: Secondary | ICD-10-CM | POA: Diagnosis not present

## 2019-10-29 LAB — GLUCOSE, CAPILLARY: Glucose-Capillary: 96 mg/dL (ref 70–99)

## 2019-10-29 MED ORDER — KETOROLAC TROMETHAMINE 30 MG/ML IJ SOLN
INTRAMUSCULAR | Status: AC
  Administered 2019-10-29: 09:00:00 30 mg via INTRAVENOUS
  Filled 2019-10-29: qty 1

## 2019-10-29 MED ORDER — GLYCOPYRROLATE 0.2 MG/ML IJ SOLN: 0.4000 mg | Freq: Once | INTRAMUSCULAR | Status: AC

## 2019-10-29 MED ORDER — LABETALOL HCL 5 MG/ML IV SOLN
INTRAVENOUS | Status: DC | PRN
  Administered 2019-10-29: 30 mg via INTRAVENOUS

## 2019-10-29 MED ORDER — SUCCINYLCHOLINE CHLORIDE 20 MG/ML IJ SOLN
INTRAMUSCULAR | Status: DC | PRN
  Administered 2019-10-29: 100 mg via INTRAVENOUS

## 2019-10-29 MED ORDER — ESMOLOL HCL 100 MG/10ML IV SOLN
INTRAVENOUS | Status: DC | PRN
  Administered 2019-10-29: 20 ug via INTRAVENOUS

## 2019-10-29 MED ORDER — KETOROLAC TROMETHAMINE 30 MG/ML IJ SOLN: 30.0000 mg | Freq: Once | INTRAMUSCULAR | Status: AC

## 2019-10-29 MED ORDER — METHOHEXITAL SODIUM 100 MG/10ML IV SOSY
PREFILLED_SYRINGE | INTRAVENOUS | Status: DC | PRN
  Administered 2019-10-29: 70 mg via INTRAVENOUS

## 2019-10-29 MED ORDER — GLYCOPYRROLATE 0.2 MG/ML IJ SOLN
INTRAMUSCULAR | Status: AC
  Administered 2019-10-29: 09:00:00 0.4 mg via INTRAVENOUS
  Filled 2019-10-29: qty 2

## 2019-10-29 MED ORDER — SODIUM CHLORIDE 0.9 % IV SOLN: INTRAVENOUS | Status: DC | PRN

## 2019-10-29 MED ORDER — SODIUM CHLORIDE 0.9 % IV SOLN
500.0000 mL | Freq: Once | INTRAVENOUS | Status: AC
  Administered 2019-10-29: 500 mL via INTRAVENOUS

## 2019-10-29 NOTE — Discharge Instructions (Signed)
1)  The drugs that you have been given will stay in your system until tomorrow so for the       next 24 hours you should not:  A. Drive an automobile  B. Make any legal decisions  C. Drink any alcoholic beverages  2)  You may resume your regular meals upon return home.  3)  A responsible adult must take you home.  Someone should stay with you for a few          hours, then be available by phone for the remainder of the treatment day.  4)  You May experience any of the following symptoms:  Headache, Nausea and a dry mouth (due to the medications you were given),  temporary memory loss and some confusion, or sore muscles (a warm bath  should help this).  If you you experience any of these symptoms let us know on                your return visit.  5)  Report any of the following: any acute discomfort, severe headache, or temperature        greater than 100.5 F.   Also report any unusual redness, swelling, drainage, or pain         at your IV site.    You may report Symptoms to:  [place] at [facility]          Phone: [phone], ECT Department           or Dr. Prescott Gum office [phone]  6)  Your next ECT Treatment is Friday January 8   We will call 2 days prior to your scheduled appointment for arrival times.  7)  Nothing to eat or drink after midnight the night before your procedure.  8)  Take morning meds with a sip of water the morning of your procedure.  9)  Other Instructions: Call [phone] to cancel the morning of your procedure due         to illness or emergency.  10) We will call within 72 hours to assess how you are feeling.

## 2019-10-29 NOTE — Anesthesia Procedure Notes (Signed)
Date/Time: 10/29/2019 10:28 AM Performed by: Doreen Salvage, CRNA Pre-anesthesia Checklist: Patient identified, Emergency Drugs available, Suction available and Patient being monitored Patient Re-evaluated:Patient Re-evaluated prior to induction Oxygen Delivery Method: Circle system utilized Preoxygenation: Pre-oxygenation with 100% oxygen Induction Type: IV induction Ventilation: Mask ventilation without difficulty and Mask ventilation throughout procedure Airway Equipment and Method: Bite block Placement Confirmation: positive ETCO2 Dental Injury: Teeth and Oropharynx as per pre-operative assessment

## 2019-10-29 NOTE — Anesthesia Preprocedure Evaluation (Signed)
Anesthesia Evaluation  Patient identified by MRN, date of birth, ID band Patient awake    Reviewed: Allergy & Precautions, H&P , NPO status , Patient's Chart, lab work & pertinent test results  History of Anesthesia Complications Negative for: history of anesthetic complications  Airway Mallampati: II  TM Distance: >3 FB Neck ROM: full    Dental  (+) Chipped   Pulmonary sleep apnea , neg COPD, neg recent URI,           Cardiovascular hypertension, (-) angina     Neuro/Psych PSYCHIATRIC DISORDERS Depression Bipolar Disorder negative neurological ROS     GI/Hepatic Neg liver ROS, GERD  Controlled,  Endo/Other  diabetesMorbid obesity  Renal/GU negative Renal ROS  negative genitourinary   Musculoskeletal   Abdominal   Peds  Hematology negative hematology ROS (+)   Anesthesia Other Findings Past Medical History: No date: Depression No date: Diabetes mellitus without complication ([place]) 99991111: Diabetic peripheral neuropathy ([place]) 03/07/15: Diabetic peripheral neuropathy ([place]) 03/07/15: Diabetic peripheral neuropathy ([facility]) No date: GERD (gastroesophageal reflux disease) 03/07/15: Hypercholesterolemia No date: Hypertension 03/07/15: Obesity 03/07/15: Personality disorder ([place]) 03/07/15: Sinus tachycardia     Comment:  history of 03/07/15: Suicidal thoughts  Past Surgical History: 12/01/2018: COLONOSCOPY WITH PROPOFOL; N/A     Comment:  Procedure: COLONOSCOPY WITH PROPOFOL;  Surgeon: Jonathon Bellows, MD;  Location: [facility] ENDOSCOPY;  Service:               Gastroenterology;  Laterality: N/A; 03/07/15: electroconvulsion therapy  BMI    Body Mass Index: 44.52 kg/m      Reproductive/Obstetrics negative OB ROS                             Anesthesia Physical  Anesthesia Plan  ASA: III  Anesthesia Plan: General   Post-op Pain Management:    Induction:   PONV Risk Score and  Plan:   Airway Management Planned: Mask  Additional Equipment:   Intra-op Plan:   Post-operative Plan:   Informed Consent: I have reviewed the patients History and Physical, chart, labs and discussed the procedure including the risks, benefits and alternatives for the proposed anesthesia with the patient or authorized representative who has indicated his/her understanding and acceptance.     Dental Advisory Given  Plan Discussed with: Anesthesiologist and CRNA  Anesthesia Plan Comments:         Anesthesia Quick Evaluation

## 2019-10-29 NOTE — H&P (Signed)
Melinda [name] is an 48 y.o. female.   Chief Complaint: mood stable with no SI HPI: chronic depression  Past Medical History:  Diagnosis Date  . Depression   . Diabetes mellitus without complication ([place])   . Diabetic peripheral neuropathy ([place]) 03/07/15  . Diabetic peripheral neuropathy ([place]) 03/07/15  . Diabetic peripheral neuropathy ([place]) 03/07/15  . GERD (gastroesophageal reflux disease)   . Hypercholesterolemia 03/07/15  . Hypertension   . Obesity 03/07/15  . Personality disorder ([place]) 03/07/15  . Sinus tachycardia 03/07/15   history of  . Suicidal thoughts 03/07/15    Past Surgical History:  Procedure Laterality Date  . COLONOSCOPY WITH PROPOFOL N/A 12/01/2018   Procedure: COLONOSCOPY WITH PROPOFOL;  Surgeon: Jonathon Bellows, MD;  Location: [facility] ENDOSCOPY;  Service: Gastroenterology;  Laterality: N/A;  . electroconvulsion therapy  03/07/15    Family History  Problem Relation Age of Onset  . Hypertension Father   . Diabetes Mother    Social History:  reports that she has never smoked. She has never used smokeless tobacco. She reports that she does not drink alcohol or use drugs.  Allergies:  Allergies  Allergen Reactions  . Prednisone     Increases blood sugar    (Not in a hospital admission)   Results for orders placed or performed during the hospital encounter of 10/29/19 (from the past 48 hour(s))  Glucose, capillary     Status: None   Collection Time: 10/29/19  8:48 AM  Result Value Ref Range   Glucose-Capillary 96 70 - 99 mg/dL   No results found.  Review of Systems  Constitutional: Negative.   HENT: Negative.   Eyes: Negative.   Respiratory: Negative.   Cardiovascular: Negative.   Gastrointestinal: Negative.   Musculoskeletal: Negative.   Skin: Negative.   Neurological: Negative.   Psychiatric/Behavioral: Negative.     Blood pressure (!) 135/95, pulse (!) 101, temperature 98.7 F (37.1 C), temperature source Oral, resp. rate 18, SpO2 99 %. Physical  Exam  Nursing note and vitals reviewed. Constitutional: She appears well-developed and well-nourished.  HENT:  Head: Normocephalic and atraumatic.  Eyes: Pupils are equal, round, and reactive to light. Conjunctivae are normal.  Cardiovascular: Regular rhythm and normal heart sounds.  Respiratory: Effort normal.  GI: Soft.  Musculoskeletal:        General: Normal range of motion.     Cervical back: Normal range of motion.  Neurological: She is alert.  Skin: Skin is warm and dry.  Psychiatric: She has a normal mood and affect. Her behavior is normal. Judgment and thought content normal.     Assessment/Plan Follow up 2 weeks  [name], MD 10/29/2019, 9:53 AM

## 2019-10-29 NOTE — Procedures (Signed)
ECT SERVICES Physician's Interval Evaluation & Treatment Note  Patient Identification: Melinda [name] MRN:  [id number] Date of Evaluation:  10/29/2019 TX #: [id number]  MADRS:   MMSE:   P.E. Findings:  No change to physical exam  Psychiatric Interval Note:  Stable if anything feeling a little bit better  Subjective:  Patient is a 48 y.o. female seen for evaluation for Electroconvulsive Therapy. No new complaint  Treatment Summary:   []   Right Unilateral             [x]  Bilateral   % Energy : 1.0 ms 35%   Impedance: 1530 ohms  Seizure Energy Index: 11,059 V squared  Postictal Suppression Index: 47%  Seizure Concordance Index: 91%  Medications  Pre Shock: Robinul 0.4 mg Toradol 30 mg esmolol 20 mg labetalol 20 mg Brevital 70 mg succinylcholine 100 mg  Post Shock:    Seizure Duration: 35 seconds EMG 56 seconds EEG   Comments: Follow-up a little over 2 weeks  Lungs:  [x]   Clear to auscultation               []  Other:   Heart:    [x]   Regular rhythm             []  irregular rhythm    [x]   Previous H&P reviewed, patient examined and there are NO CHANGES                 []   Previous H&P reviewed, patient examined and there are changes noted.   [name], MD 12/18/20204:05 PM

## 2019-10-29 NOTE — Anesthesia Post-op Follow-up Note (Signed)
Anesthesia QCDR form completed.        

## 2019-10-29 NOTE — Anesthesia Postprocedure Evaluation (Signed)
Anesthesia Post Note  Patient: Melinda [name]  Procedure(s) Performed: ECT TX  Patient location during evaluation: PACU Anesthesia Type: General Level of consciousness: awake and alert Pain management: pain level controlled Vital Signs Assessment: post-procedure vital signs reviewed and stable Respiratory status: spontaneous breathing, nonlabored ventilation and respiratory function stable Cardiovascular status: blood pressure returned to baseline and stable Postop Assessment: no apparent nausea or vomiting Anesthetic complications: no     Last Vitals:  Vitals:   10/29/19 1107 10/29/19 1117  BP: 100/72 125/82  Pulse: 96 92  Resp: 20 18  Temp:  37.1 C  SpO2: 97%     Last Pain:  Vitals:   10/29/19 1117  TempSrc: Tympanic  PainSc:                  Tera Mater

## 2019-10-29 NOTE — Transfer of Care (Signed)
Immediate Anesthesia Transfer of Care Note  Patient: Melinda [name]  Procedure(s) Performed: ECT TX  Patient Location: PACU  Anesthesia Type:General  Level of Consciousness: awake  Airway & Oxygen Therapy: Patient Spontanous Breathing  Post-op Assessment: Report given to RN and Post -op Vital signs reviewed and stable  Post vital signs: Reviewed and stable  Last Vitals:  Vitals Value Taken Time  BP    Temp    Pulse    Resp    SpO2      Last Pain:  Vitals:   10/29/19 0854  TempSrc: Oral         Complications: No apparent anesthesia complications

## 2019-11-10 ENCOUNTER — Other Ambulatory Visit: Payer: Self-pay | Admitting: Psychiatry

## 2019-11-10 ENCOUNTER — Other Ambulatory Visit: Payer: Self-pay | Admitting: Physician Assistant

## 2019-11-10 DIAGNOSIS — E119 Type 2 diabetes mellitus without complications: Secondary | ICD-10-CM

## 2019-11-11 NOTE — Telephone Encounter (Signed)
Requested Prescriptions  Pending Prescriptions Disp Refills  . atorvastatin (LIPITOR) 10 MG tablet [Pharmacy Med Name: ATORVASTATIN CALCIUM 10 MG Tablet] 90 tablet 1    Sig: TAKE 1 TABLET AT BEDTIME     Cardiovascular:  Antilipid - Statins Failed - 11/10/2019 10:24 PM      Failed - LDL in normal range and within 360 days    LDL Chol Calc (NIH)  Date Value Ref Range Status  09/14/2019 70 0 - 99 mg/dL Final         Passed - Total Cholesterol in normal range and within 360 days    Cholesterol, Total  Date Value Ref Range Status  09/14/2019 130 100 - 199 mg/dL Final         Passed - HDL in normal range and within 360 days    HDL  Date Value Ref Range Status  09/14/2019 47 >39 mg/dL Final         Passed - Triglycerides in normal range and within 360 days    Triglycerides  Date Value Ref Range Status  09/14/2019 59 0 - 149 mg/dL Final         Passed - Patient is not pregnant      Passed - Valid encounter within last 12 months    Recent Outpatient Visits          1 month ago Type 2 diabetes mellitus with diabetic polyneuropathy, without long-term current use of insulin [facility])   [place], Adriana M, PA-C   6 months ago Type 2 diabetes mellitus with diabetic polyneuropathy, without long-term current use of insulin [facility])   [place], [place], [place]   8 months ago Right arm pain   [place], [place], [place]   9 months ago Diabetes mellitus without complication [facility])   [facility] Carles Collet M, [place]   12 months ago Annual physical exam   [facility] Trinna Post, [place]      Future Appointments            In 2 months Trinna Post, PA-C [employer], [place]

## 2019-11-15 ENCOUNTER — Telehealth: Payer: Self-pay

## 2019-11-17 ENCOUNTER — Other Ambulatory Visit: Payer: Self-pay

## 2019-11-17 ENCOUNTER — Other Ambulatory Visit
Admission: RE | Admit: 2019-11-17 | Discharge: 2019-11-17 | Disposition: A | Discharge status: Home / Self Care | Payer: Medicare PPO | Source: Ambulatory Visit | Attending: Psychiatry | Admitting: Psychiatry

## 2019-11-17 DIAGNOSIS — Z20822 Contact with and (suspected) exposure to covid-19: Secondary | ICD-10-CM | POA: Diagnosis not present

## 2019-11-17 DIAGNOSIS — Z01812 Encounter for preprocedural laboratory examination: Secondary | ICD-10-CM | POA: Insufficient documentation

## 2019-11-17 LAB — SARS CORONAVIRUS 2 (TAT 6-24 HRS): SARS Coronavirus 2: NEGATIVE

## 2019-11-19 ENCOUNTER — Other Ambulatory Visit: Payer: Self-pay

## 2019-11-19 ENCOUNTER — Encounter
Admission: RE | Admit: 2019-11-19 | Discharge: 2019-11-19 | Disposition: A | Discharge status: Home / Self Care | Payer: Medicare PPO | Source: Ambulatory Visit | Attending: Psychiatry | Admitting: Psychiatry

## 2019-11-19 ENCOUNTER — Encounter: Payer: Self-pay | Admitting: Anesthesiology

## 2019-11-19 ENCOUNTER — Ambulatory Visit: Payer: Self-pay | Admitting: Anesthesiology

## 2019-11-19 ENCOUNTER — Other Ambulatory Visit: Payer: Self-pay | Admitting: Psychiatry

## 2019-11-19 ENCOUNTER — Other Ambulatory Visit: Payer: Medicare PPO

## 2019-11-19 DIAGNOSIS — I10 Essential (primary) hypertension: Secondary | ICD-10-CM | POA: Diagnosis not present

## 2019-11-19 DIAGNOSIS — Z20828 Contact with and (suspected) exposure to other viral communicable diseases: Secondary | ICD-10-CM | POA: Insufficient documentation

## 2019-11-19 DIAGNOSIS — G4730 Sleep apnea, unspecified: Secondary | ICD-10-CM | POA: Diagnosis not present

## 2019-11-19 DIAGNOSIS — E669 Obesity, unspecified: Secondary | ICD-10-CM | POA: Insufficient documentation

## 2019-11-19 DIAGNOSIS — E1142 Type 2 diabetes mellitus with diabetic polyneuropathy: Secondary | ICD-10-CM | POA: Diagnosis not present

## 2019-11-19 DIAGNOSIS — E7800 Pure hypercholesterolemia, unspecified: Secondary | ICD-10-CM | POA: Diagnosis not present

## 2019-11-19 DIAGNOSIS — E119 Type 2 diabetes mellitus without complications: Secondary | ICD-10-CM | POA: Diagnosis not present

## 2019-11-19 DIAGNOSIS — F332 Major depressive disorder, recurrent severe without psychotic features: Secondary | ICD-10-CM | POA: Diagnosis not present

## 2019-11-19 DIAGNOSIS — K219 Gastro-esophageal reflux disease without esophagitis: Secondary | ICD-10-CM | POA: Diagnosis not present

## 2019-11-19 DIAGNOSIS — F329 Major depressive disorder, single episode, unspecified: Secondary | ICD-10-CM | POA: Insufficient documentation

## 2019-11-19 LAB — GLUCOSE, CAPILLARY: Glucose-Capillary: 105 mg/dL — ABNORMAL HIGH (ref 70–99)

## 2019-11-19 MED ORDER — SUCCINYLCHOLINE CHLORIDE 20 MG/ML IJ SOLN
INTRAMUSCULAR | Status: DC | PRN
  Administered 2019-11-19: 100 mg via INTRAVENOUS

## 2019-11-19 MED ORDER — GLYCOPYRROLATE 0.2 MG/ML IJ SOLN
INTRAMUSCULAR | Status: AC
  Administered 2019-11-19: 0.4 mg via INTRAVENOUS
  Filled 2019-11-19: qty 2

## 2019-11-19 MED ORDER — SODIUM CHLORIDE 0.9 % IV SOLN: INTRAVENOUS | Status: DC | PRN

## 2019-11-19 MED ORDER — KETOROLAC TROMETHAMINE 30 MG/ML IJ SOLN: 30.0000 mg | Freq: Once | INTRAMUSCULAR | Status: AC

## 2019-11-19 MED ORDER — KETOROLAC TROMETHAMINE 30 MG/ML IJ SOLN
INTRAMUSCULAR | Status: AC
  Administered 2019-11-19: 30 mg via INTRAVENOUS
  Filled 2019-11-19: qty 1

## 2019-11-19 MED ORDER — ESMOLOL HCL 100 MG/10ML IV SOLN
INTRAVENOUS | Status: DC | PRN
  Administered 2019-11-19: 20 mg via INTRAVENOUS

## 2019-11-19 MED ORDER — GLYCOPYRROLATE 0.2 MG/ML IJ SOLN: 0.4000 mg | Freq: Once | INTRAMUSCULAR | Status: AC

## 2019-11-19 MED ORDER — LABETALOL HCL 5 MG/ML IV SOLN
INTRAVENOUS | Status: DC | PRN
  Administered 2019-11-19: 30 mg via INTRAVENOUS

## 2019-11-19 MED ORDER — SODIUM CHLORIDE 0.9 % IV SOLN
500.0000 mL | Freq: Once | INTRAVENOUS | Status: AC
  Administered 2019-11-19: 500 mL via INTRAVENOUS

## 2019-11-19 MED ORDER — METHOHEXITAL SODIUM 100 MG/10ML IV SOSY
PREFILLED_SYRINGE | INTRAVENOUS | Status: DC | PRN
  Administered 2019-11-19: 70 mg via INTRAVENOUS

## 2019-11-19 NOTE — Anesthesia Preprocedure Evaluation (Signed)
Anesthesia Evaluation  Patient identified by MRN, date of birth, ID band Patient awake    Reviewed: Allergy & Precautions, H&P , NPO status , Patient's Chart, lab work & pertinent test results  History of Anesthesia Complications Negative for: history of anesthetic complications  Airway Mallampati: II  TM Distance: >3 FB Neck ROM: full    Dental  (+) Poor Dentition, Chipped   Pulmonary sleep apnea , neg COPD,    Pulmonary exam normal breath sounds clear to auscultation       Cardiovascular hypertension, Pt. on medications (-) CAD and (-) Past MI negative cardio ROS Normal cardiovascular exam Rhythm:regular Rate:Normal     Neuro/Psych PSYCHIATRIC DISORDERS Depression Bipolar Disorder  Neuromuscular disease negative neurological ROS     GI/Hepatic negative GI ROS, Neg liver ROS, GERD  Medicated,  Endo/Other  diabetes, Type 2, Oral Hypoglycemic Agents  Renal/GU negative Renal ROS  negative genitourinary   Musculoskeletal   Abdominal (+) + obese,   Peds  Hematology negative hematology ROS (+)   Anesthesia Other Findings Past Medical History: No date: Depression No date: Diabetes mellitus without complication (HCC) 2/56/38: Diabetic peripheral neuropathy ([place]) 03/07/15: Diabetic peripheral neuropathy ([place]) 03/07/15: Diabetic peripheral neuropathy (HCC) No date: GERD (gastroesophageal reflux disease) 03/07/15: Hypercholesterolemia No date: Hypertension 03/07/15: Obesity 03/07/15: Personality disorder 03/07/15: Sinus tachycardia ([place])     Comment: history of 03/07/15: Suicidal thoughts Past Surgical History: 03/07/15: electroconvulsion therapy BMI    Body Mass Index:  36.44 kg/m     Reproductive/Obstetrics                             Anesthesia Physical  Anesthesia Plan  ASA: III  Anesthesia Plan: General   Post-op Pain Management:    Induction: Intravenous  PONV Risk Score and  Plan: 2 and Ondansetron  Airway Management Planned: Mask  Additional Equipment:   Intra-op Plan:   Post-operative Plan:   Informed Consent: I have reviewed the patients History and Physical, chart, labs and discussed the procedure including the risks, benefits and alternatives for the proposed anesthesia with the patient or authorized representative who has indicated his/her understanding and acceptance.     Dental Advisory Given  Plan Discussed with: CRNA and Anesthesiologist  Anesthesia Plan Comments:         Anesthesia Quick Evaluation

## 2019-11-19 NOTE — Transfer of Care (Signed)
Immediate Anesthesia Transfer of Care Note  Patient: Melinda [name]  Procedure(s) Performed: ECT TX  Patient Location: PACU  Anesthesia Type:General  Level of Consciousness: drowsy and patient cooperative  Airway & Oxygen Therapy: Patient Spontanous Breathing  Post-op Assessment: Report given to RN and Post -op Vital signs reviewed and stable  Post vital signs: Reviewed and stable  Last Vitals:  Vitals Value Taken Time  BP 110/73 11/19/19 1052  Temp 37.3 C 11/19/19 1052  Pulse 98 11/19/19 1053  Resp 20 11/19/19 1053  SpO2 96 % 11/19/19 1053  Vitals shown include unvalidated device data.  Last Pain:  Vitals:   11/19/19 0931  TempSrc: Oral         Complications: No apparent anesthesia complications

## 2019-11-19 NOTE — Discharge Instructions (Signed)
1)  The drugs that you have been given will stay in your system until tomorrow so for the       next 24 hours you should not:  A. Drive an automobile  B. Make any legal decisions  C. Drink any alcoholic beverages  2)  You may resume your regular meals upon return home.  3)  A responsible adult must take you home.  Someone should stay with you for a few          hours, then be available by phone for the remainder of the treatment day.  4)  You May experience any of the following symptoms:  Headache, Nausea and a dry mouth (due to the medications you were given),  temporary memory loss and some confusion, or sore muscles (a warm bath  should help this).  If you you experience any of these symptoms let us know on                your return visit.  5)  Report any of the following: any acute discomfort, severe headache, or temperature        greater than 100.5 F.   Also report any unusual redness, swelling, drainage, or pain         at your IV site.    You may report Symptoms to:  [place] at [facility]          Phone: [phone], ECT Department           or Dr. Prescott Gum office [phone]  6)  Your next ECT Treatment is Friday December 03, 2019 at 8:30   We will call 2 days prior to your scheduled appointment for arrival times.  7)  Nothing to eat or drink after midnight the night before your procedure.  8)  Take Lisinopril with a sip of water the morning of your procedure.  9)  Other Instructions: Call [phone] to cancel the morning of your procedure due         to illness or emergency.  10) We will call within 72 hours to assess how you are feeling.

## 2019-11-19 NOTE — Anesthesia Postprocedure Evaluation (Signed)
Anesthesia Post Note  Patient: Melisse M Jarmon  Procedure(s) Performed: ECT TX  Patient location during evaluation: PACU Anesthesia Type: General Level of consciousness: awake and alert and oriented Pain management: pain level controlled Vital Signs Assessment: post-procedure vital signs reviewed and stable Respiratory status: spontaneous breathing, nonlabored ventilation and respiratory function stable Cardiovascular status: blood pressure returned to baseline and stable Postop Assessment: no signs of nausea or vomiting Anesthetic complications: no     Last Vitals:  Vitals:   11/19/19 1119 11/19/19 1158  BP: 104/68 110/75  Pulse: 92 90  Resp: (!) 22 18  Temp: (!) 36.4 C 37.1 C  SpO2: 97%     Last Pain:  Vitals:   11/19/19 1158  TempSrc: Oral  PainSc:                  Teoman Giraud

## 2019-11-19 NOTE — H&P (Signed)
Melinda [name] is an 49 y.o. female.   Chief Complaint: depressed HPI: chronic depression  Past Medical History:  Diagnosis Date  . Depression   . Diabetes mellitus without complication ([place])   . Diabetic peripheral neuropathy ([place]) 03/07/15  . Diabetic peripheral neuropathy ([place]) 03/07/15  . Diabetic peripheral neuropathy ([place]) 03/07/15  . GERD (gastroesophageal reflux disease)   . Hypercholesterolemia 03/07/15  . Hypertension   . Obesity 03/07/15  . Personality disorder ([place]) 03/07/15  . Sinus tachycardia 03/07/15   history of  . Suicidal thoughts 03/07/15    Past Surgical History:  Procedure Laterality Date  . COLONOSCOPY WITH PROPOFOL N/A 12/01/2018   Procedure: COLONOSCOPY WITH PROPOFOL;  Surgeon: Jonathon Bellows, MD;  Location: [facility] ENDOSCOPY;  Service: Gastroenterology;  Laterality: N/A;  . electroconvulsion therapy  03/07/15    Family History  Problem Relation Age of Onset  . Hypertension Father   . Diabetes Mother    Social History:  reports that she has never smoked. She has never used smokeless tobacco. She reports that she does not drink alcohol or use drugs.  Allergies:  Allergies  Allergen Reactions  . Prednisone     Increases blood sugar    (Not in a hospital admission)   Results for orders placed or performed during the hospital encounter of 11/19/19 (from the past 48 hour(s))  Glucose, capillary     Status: Abnormal   Collection Time: 11/19/19  9:27 AM  Result Value Ref Range   Glucose-Capillary 105 (H) 70 - 99 mg/dL   No results found.  Review of Systems  Constitutional: Negative.   HENT: Negative.   Eyes: Negative.   Respiratory: Negative.   Cardiovascular: Negative.   Gastrointestinal: Negative.   Musculoskeletal: Negative.   Skin: Negative.   Neurological: Negative.   Psychiatric/Behavioral: Positive for dysphoric mood and suicidal ideas. The patient is nervous/anxious.     Blood pressure 140/70, pulse 93, temperature 98.8 F (37.1 C),  temperature source Oral, resp. rate 18, SpO2 98 %. Physical Exam  Nursing note and vitals reviewed. Cardiovascular: Regular rhythm.  Psychiatric: Judgment normal. Her affect is blunt. Her speech is delayed. She is slowed. Cognition and memory are normal. She expresses suicidal ideation. She expresses no suicidal plans.     Assessment/Plan Continue q 2 week schedule  [name], MD 11/19/2019, 9:49 AM

## 2019-11-29 ENCOUNTER — Other Ambulatory Visit: Payer: Self-pay | Admitting: Psychiatry

## 2019-11-29 ENCOUNTER — Telehealth: Payer: Self-pay

## 2019-12-01 ENCOUNTER — Other Ambulatory Visit
Admission: RE | Admit: 2019-12-01 | Discharge: 2019-12-01 | Disposition: A | Discharge status: Home / Self Care | Payer: Medicare PPO | Source: Ambulatory Visit | Attending: Psychiatry | Admitting: Psychiatry

## 2019-12-01 ENCOUNTER — Other Ambulatory Visit: Payer: Self-pay

## 2019-12-01 DIAGNOSIS — Z20822 Contact with and (suspected) exposure to covid-19: Secondary | ICD-10-CM | POA: Insufficient documentation

## 2019-12-01 DIAGNOSIS — Z01812 Encounter for preprocedural laboratory examination: Secondary | ICD-10-CM | POA: Diagnosis not present

## 2019-12-01 LAB — SARS CORONAVIRUS 2 (TAT 6-24 HRS): SARS Coronavirus 2: NEGATIVE

## 2019-12-02 ENCOUNTER — Other Ambulatory Visit: Payer: Self-pay | Admitting: Psychiatry

## 2019-12-03 ENCOUNTER — Ambulatory Visit: Payer: Self-pay | Admitting: Certified Registered"

## 2019-12-03 ENCOUNTER — Other Ambulatory Visit: Payer: Self-pay

## 2019-12-03 ENCOUNTER — Encounter (HOSPITAL_BASED_OUTPATIENT_CLINIC_OR_DEPARTMENT_OTHER)
Admission: RE | Admit: 2019-12-03 | Discharge: 2019-12-03 | Disposition: A | Discharge status: Home / Self Care | Payer: Medicare PPO | Source: Ambulatory Visit | Attending: Psychiatry | Admitting: Psychiatry

## 2019-12-03 ENCOUNTER — Encounter: Payer: Self-pay | Admitting: Certified Registered"

## 2019-12-03 DIAGNOSIS — E1142 Type 2 diabetes mellitus with diabetic polyneuropathy: Secondary | ICD-10-CM | POA: Diagnosis not present

## 2019-12-03 DIAGNOSIS — F332 Major depressive disorder, recurrent severe without psychotic features: Secondary | ICD-10-CM | POA: Diagnosis not present

## 2019-12-03 DIAGNOSIS — Z20828 Contact with and (suspected) exposure to other viral communicable diseases: Secondary | ICD-10-CM | POA: Diagnosis not present

## 2019-12-03 DIAGNOSIS — I10 Essential (primary) hypertension: Secondary | ICD-10-CM | POA: Diagnosis not present

## 2019-12-03 DIAGNOSIS — E669 Obesity, unspecified: Secondary | ICD-10-CM | POA: Diagnosis not present

## 2019-12-03 DIAGNOSIS — E7800 Pure hypercholesterolemia, unspecified: Secondary | ICD-10-CM | POA: Diagnosis not present

## 2019-12-03 DIAGNOSIS — F329 Major depressive disorder, single episode, unspecified: Secondary | ICD-10-CM | POA: Diagnosis not present

## 2019-12-03 LAB — GLUCOSE, CAPILLARY: Glucose-Capillary: 104 mg/dL — ABNORMAL HIGH (ref 70–99)

## 2019-12-03 MED ORDER — METHOHEXITAL SODIUM 100 MG/10ML IV SOSY
PREFILLED_SYRINGE | INTRAVENOUS | Status: DC | PRN
  Administered 2019-12-03: 70 mg via INTRAVENOUS

## 2019-12-03 MED ORDER — KETOROLAC TROMETHAMINE 30 MG/ML IJ SOLN
INTRAMUSCULAR | Status: AC
  Filled 2019-12-03: qty 1

## 2019-12-03 MED ORDER — LABETALOL HCL 5 MG/ML IV SOLN
INTRAVENOUS | Status: DC | PRN
  Administered 2019-12-03: 30 mg via INTRAVENOUS

## 2019-12-03 MED ORDER — LABETALOL HCL 5 MG/ML IV SOLN
INTRAVENOUS | Status: AC
  Filled 2019-12-03: qty 8

## 2019-12-03 MED ORDER — GLYCOPYRROLATE 0.2 MG/ML IJ SOLN
0.4000 mg | Freq: Once | INTRAMUSCULAR | Status: AC
  Administered 2019-12-03: 0.4 mg via INTRAVENOUS

## 2019-12-03 MED ORDER — SODIUM CHLORIDE 0.9 % IV SOLN
500.0000 mL | Freq: Once | INTRAVENOUS | Status: AC
  Administered 2019-12-03: 500 mL via INTRAVENOUS

## 2019-12-03 MED ORDER — KETOROLAC TROMETHAMINE 30 MG/ML IJ SOLN
30.0000 mg | Freq: Once | INTRAMUSCULAR | Status: AC
  Administered 2019-12-03: 30 mg via INTRAVENOUS

## 2019-12-03 MED ORDER — GLYCOPYRROLATE 0.2 MG/ML IJ SOLN
INTRAMUSCULAR | Status: AC
  Filled 2019-12-03: qty 2

## 2019-12-03 MED ORDER — ESMOLOL HCL 100 MG/10ML IV SOLN
INTRAVENOUS | Status: AC
  Filled 2019-12-03: qty 10

## 2019-12-03 MED ORDER — ESMOLOL HCL 100 MG/10ML IV SOLN
INTRAVENOUS | Status: DC | PRN
  Administered 2019-12-03: 20 mg via INTRAVENOUS

## 2019-12-03 MED ORDER — SUCCINYLCHOLINE CHLORIDE 20 MG/ML IJ SOLN
INTRAMUSCULAR | Status: DC | PRN
  Administered 2019-12-03: 100 mg via INTRAVENOUS

## 2019-12-03 NOTE — Discharge Instructions (Signed)
1)  The drugs that you have been given will stay in your system until tomorrow so for the       next 24 hours you should not:  A. Drive an automobile  B. Make any legal decisions  C. Drink any alcoholic beverages  2)  You may resume your regular meals upon return home.  3)  A responsible adult must take you home.  Someone should stay with you for a few          hours, then be available by phone for the remainder of the treatment day.  4)  You May experience any of the following symptoms:  Headache, Nausea and a dry mouth (due to the medications you were given),  temporary memory loss and some confusion, or sore muscles (a warm bath  should help this).  If you you experience any of these symptoms let us know on                your return visit.  5)  Report any of the following: any acute discomfort, severe headache, or temperature        greater than 100.5 F.   Also report any unusual redness, swelling, drainage, or pain         at your IV site.    You may report Symptoms to:  [place] at [facility]          Phone: [phone], ECT Department           or Dr. Prescott Gum office [phone]  6)  Your next ECT Treatment is Day Monday  Date Feburary 8  We will call 2 days prior to your scheduled appointment for arrival times.  7)  Nothing to eat or drink after midnight the night before your procedure.  8)  Take lisinopril   With a sip of water the morning of your procedure.  9)  Other Instructions: Call [phone] to cancel the morning of your procedure due         to illness or emergency.  10) We will call within 72 hours to assess how you are feeling.

## 2019-12-03 NOTE — Procedures (Signed)
ECT SERVICES Physician's Interval Evaluation & Treatment Note  Patient Identification: Melinda [name] MRN:  [id number] Date of Evaluation:  12/03/2019 TX #: [id number]  MADRS:   MMSE:   P.E. Findings:  No change to physical exam  Psychiatric Interval Note:  Stable no new complaint  Subjective:  Patient is a 49 y.o. female seen for evaluation for Electroconvulsive Therapy. Feeling okay  Treatment Summary:   [x]   Right Unilateral             [x]  Bilateral   % Energy : 1.0 ms 35%   Impedance: 1200 ohms  Seizure Energy Index: 3521 V squared  Postictal Suppression Index: 76%  Seizure Concordance Index: 94%  Medications  Pre Shock: Robinul 0.4 mg Toradol 30 mg esmolol 20 mg labetalol 20 mg Brevital 70 mg succinylcholine 100 mg  Post Shock:    Seizure Duration: 35 seconds EMG 70 seconds EEG   Comments: Stable doing well follow-up 2 weeks  Lungs:  [x]   Clear to auscultation               []  Other:   Heart:    [x]   Regular rhythm             []  irregular rhythm    [x]   Previous H&P reviewed, patient examined and there are NO CHANGES                 []   Previous H&P reviewed, patient examined and there are changes noted.   [name], MD 1/22/202110:49 AM

## 2019-12-03 NOTE — Transfer of Care (Signed)
Immediate Anesthesia Transfer of Care Note  Patient: Melinda [name]  Procedure(s) Performed: ECT TX  Patient Location: PACU  Anesthesia Type:General  Level of Consciousness: drowsy  Airway & Oxygen Therapy: Patient Spontanous Breathing and Patient connected to face mask oxygen  Post-op Assessment: Report given to RN and Post -op Vital signs reviewed and stable  Post vital signs: Reviewed and stable  Last Vitals:  Vitals Value Taken Time  BP 135/87 12/03/19 1129  Temp    Pulse 98 12/03/19 1132  Resp 25 12/03/19 1132  SpO2 96 % 12/03/19 1132  Vitals shown include unvalidated device data.  Last Pain:  Vitals:   12/03/19 1120  TempSrc:   PainSc: 0-No pain         Complications: No apparent anesthesia complications

## 2019-12-03 NOTE — H&P (Signed)
Melinda [name] is an 49 y.o. female.   Chief Complaint: No specific complaint HPI: History of recurrent severe depression  Past Medical History:  Diagnosis Date  . Depression   . Diabetes mellitus without complication ([place])   . Diabetic peripheral neuropathy ([place]) 03/07/15  . Diabetic peripheral neuropathy ([place]) 03/07/15  . Diabetic peripheral neuropathy ([place]) 03/07/15  . GERD (gastroesophageal reflux disease)   . Hypercholesterolemia 03/07/15  . Hypertension   . Obesity 03/07/15  . Personality disorder ([place]) 03/07/15  . Sinus tachycardia 03/07/15   history of  . Suicidal thoughts 03/07/15    Past Surgical History:  Procedure Laterality Date  . COLONOSCOPY WITH PROPOFOL N/A 12/01/2018   Procedure: COLONOSCOPY WITH PROPOFOL;  Surgeon: Jonathon Bellows, MD;  Location: [facility] ENDOSCOPY;  Service: Gastroenterology;  Laterality: N/A;  . electroconvulsion therapy  03/07/15    Family History  Problem Relation Age of Onset  . Hypertension Father   . Diabetes Mother    Social History:  reports that she has never smoked. She has never used smokeless tobacco. She reports that she does not drink alcohol or use drugs.  Allergies:  Allergies  Allergen Reactions  . Prednisone     Increases blood sugar    (Not in a hospital admission)   Results for orders placed or performed during the hospital encounter of 12/03/19 (from the past 48 hour(s))  Glucose, capillary     Status: Abnormal   Collection Time: 12/03/19  8:44 AM  Result Value Ref Range   Glucose-Capillary 104 (H) 70 - 99 mg/dL   No results found.  Review of Systems  Constitutional: Negative.   HENT: Negative.   Eyes: Negative.   Respiratory: Negative.   Cardiovascular: Negative.   Gastrointestinal: Negative.   Musculoskeletal: Negative.   Skin: Negative.   Neurological: Negative.   Psychiatric/Behavioral: Negative.     Blood pressure 127/79, pulse 96, temperature 98.1 F (36.7 C), temperature source Oral, resp. rate 18,  height 5\' 4"  (1.626 m), weight 104.3 kg, SpO2 100 %. Physical Exam  Nursing note and vitals reviewed. Constitutional: She appears well-developed and well-nourished.  HENT:  Head: Normocephalic and atraumatic.  Eyes: Pupils are equal, round, and reactive to light. Conjunctivae are normal.  Cardiovascular: Regular rhythm and normal heart sounds.  Respiratory: Effort normal.  GI: Soft.  Musculoskeletal:        General: Normal range of motion.     Cervical back: Normal range of motion.  Neurological: She is alert.  Skin: Skin is warm and dry.  Psychiatric: She has a normal mood and affect. Her behavior is normal. Judgment and thought content normal.     Assessment/Plan Stable on 2-week ECT schedule which we will try to continue going forward as best we can  [name], MD 12/03/2019, 10:41 AM

## 2019-12-03 NOTE — Anesthesia Preprocedure Evaluation (Addendum)
Anesthesia Evaluation  Patient identified by MRN, date of birth, ID band Patient awake    Reviewed: Allergy & Precautions, H&P , NPO status , Patient's Chart, lab work & pertinent test results  Airway Mallampati: III  TM Distance: >3 FB Neck ROM: full    Dental  (+) Chipped   Pulmonary sleep apnea , neg recent URI, Not current smoker,           Cardiovascular hypertension, (-) angina(-) Past MI      Neuro/Psych PSYCHIATRIC DISORDERS Depression Bipolar Disorder Peripheral neuropathy    GI/Hepatic Neg liver ROS, GERD  Controlled,  Endo/Other  diabetesMorbid obesity  Renal/GU negative Renal ROS  negative genitourinary   Musculoskeletal   Abdominal   Peds  Hematology negative hematology ROS (+)   Anesthesia Other Findings Past Medical History: No date: Depression No date: Diabetes mellitus without complication (HCC) 03/07/15: Diabetic peripheral neuropathy (HCC) 03/07/15: Diabetic peripheral neuropathy (HCC) 03/07/15: Diabetic peripheral neuropathy (HCC) No date: GERD (gastroesophageal reflux disease) 03/07/15: Hypercholesterolemia No date: Hypertension 03/07/15: Obesity 03/07/15: Personality disorder (HCC) 03/07/15: Sinus tachycardia     Comment:  history of 03/07/15: Suicidal thoughts  Past Surgical History: 12/01/2018: COLONOSCOPY WITH PROPOFOL; N/A     Comment:  Procedure: COLONOSCOPY WITH PROPOFOL;  Surgeon: Anna,               Kiran, MD;  Location: [facility] ENDOSCOPY;  Service:               Gastroenterology;  Laterality: N/A; 03/07/15: electroconvulsion therapy  BMI    Body Mass Index: 39.48 kg/m      Reproductive/Obstetrics negative OB ROS                                                              Anesthesia Evaluation  Patient identified by MRN, date of birth, ID band Patient awake    Reviewed: Allergy & Precautions, H&P , NPO status , Patient's Chart, lab work & pertinent test  results  History of Anesthesia Complications Negative for: history of anesthetic complications  Airway Mallampati: II  TM Distance: >3 FB Neck ROM: full    Dental  (+) Chipped   Pulmonary sleep apnea , neg COPD, neg recent URI,           Cardiovascular hypertension, (-) angina     Neuro/Psych PSYCHIATRIC DISORDERS Depression Bipolar Disorder negative neurological ROS     GI/Hepatic Neg liver ROS, GERD  Controlled,  Endo/Other  diabetesMorbid obesity  Renal/GU negative Renal ROS  negative genitourinary   Musculoskeletal   Abdominal   Peds  Hematology negative hematology ROS (+)   Anesthesia Other Findings Past Medical History: No date: Depression No date: Diabetes mellitus without complication (HCC) 03/07/15: Diabetic peripheral neuropathy (HCC) 03/07/15: Diabetic peripheral neuropathy (HCC) 03/07/15: Diabetic peripheral neuropathy (HCC) No date: GERD (gastroesophageal reflux disease) 03/07/15: Hypercholesterolemia No date: Hypertension 03/07/15: Obesity 03/07/15: Personality disorder (HCC) 03/07/15: Sinus tachycardia     Comment:  history of 03/07/15: Suicidal thoughts  Past Surgical History: 12/01/2018: COLONOSCOPY WITH PROPOFOL; N/A     Comment:  Procedure: COLONOSCOPY WITH PROPOFOL;  Surgeon: Anna,               Kiran, MD;  Location: [facility] ENDOSCOPY;  Service:                 Gastroenterology;  Laterality: N/A; 03/07/15: electroconvulsion therapy  BMI    Body Mass Index: 44.52 kg/m      Reproductive/Obstetrics negative OB ROS                             Anesthesia Physical  Anesthesia Plan  ASA: III  Anesthesia Plan: General   Post-op Pain Management:    Induction:   PONV Risk Score and Plan:   Airway Management Planned: Mask  Additional Equipment:   Intra-op Plan:   Post-operative Plan:   Informed Consent: I have reviewed the patients History and Physical, chart, labs and discussed the procedure  including the risks, benefits and alternatives for the proposed anesthesia with the patient or authorized representative who has indicated his/her understanding and acceptance.     Dental Advisory Given  Plan Discussed with: Anesthesiologist and CRNA  Anesthesia Plan Comments:         Anesthesia Quick Evaluation                                   Anesthesia Evaluation  Patient identified by MRN, date of birth, ID band Patient awake    Reviewed: Allergy & Precautions, H&P , NPO status , Patient's Chart, lab work & pertinent test results  History of Anesthesia Complications Negative for: history of anesthetic complications  Airway Mallampati: II  TM Distance: >3 FB Neck ROM: full    Dental  (+) Chipped   Pulmonary sleep apnea , neg COPD, neg recent URI,           Cardiovascular hypertension, (-) angina     Neuro/Psych PSYCHIATRIC DISORDERS Depression Bipolar Disorder negative neurological ROS     GI/Hepatic Neg liver ROS, GERD  Controlled,  Endo/Other  diabetesMorbid obesity  Renal/GU negative Renal ROS  negative genitourinary   Musculoskeletal   Abdominal   Peds  Hematology negative hematology ROS (+)   Anesthesia Other Findings Past Medical History: No date: Depression No date: Diabetes mellitus without complication (HCC) 03/07/15: Diabetic peripheral neuropathy (HCC) 03/07/15: Diabetic peripheral neuropathy (HCC) 03/07/15: Diabetic peripheral neuropathy (HCC) No date: GERD (gastroesophageal reflux disease) 03/07/15: Hypercholesterolemia No date: Hypertension 03/07/15: Obesity 03/07/15: Personality disorder (HCC) 03/07/15: Sinus tachycardia     Comment:  history of 03/07/15: Suicidal thoughts  Past Surgical History: 12/01/2018: COLONOSCOPY WITH PROPOFOL; N/A     Comment:  Procedure: COLONOSCOPY WITH PROPOFOL;  Surgeon: Anna,               Kiran, MD;  Location: [facility] ENDOSCOPY;  Service:               Gastroenterology;  Laterality:  N/A; 03/07/15: electroconvulsion therapy  BMI    Body Mass Index: 44.52 kg/m      Reproductive/Obstetrics negative OB ROS                             Anesthesia Physical  Anesthesia Plan  ASA: III  Anesthesia Plan: General   Post-op Pain Management:    Induction:   PONV Risk Score and Plan:   Airway Management Planned: Mask  Additional Equipment:   Intra-op Plan:   Post-operative Plan:   Informed Consent: I have reviewed the patients History and Physical, chart, labs and discussed the procedure including the risks, benefits and alternatives for the proposed anesthesia with the patient or authorized   representative who has indicated his/her understanding and acceptance.     Dental Advisory Given  Plan Discussed with: Anesthesiologist and CRNA  Anesthesia Plan Comments:         Anesthesia Quick Evaluation  Anesthesia Physical  Anesthesia Plan  ASA: II  Anesthesia Plan: General   Post-op Pain Management:    Induction:   PONV Risk Score and Plan:   Airway Management Planned: Mask  Additional Equipment:   Intra-op Plan:   Post-operative Plan:   Informed Consent: I have reviewed the patients History and Physical, chart, labs and discussed the procedure including the risks, benefits and alternatives for the proposed anesthesia with the patient or authorized representative who has indicated his/her understanding and acceptance.     Dental Advisory Given  Plan Discussed with: Anesthesiologist, CRNA and Surgeon  Anesthesia Plan Comments:         Anesthesia Quick Evaluation  

## 2019-12-04 NOTE — Anesthesia Postprocedure Evaluation (Signed)
Anesthesia Post Note  Patient: Melinda [name]  Procedure(s) Performed: ECT TX  Patient location during evaluation: PACU Anesthesia Type: General Level of consciousness: awake and alert Pain management: pain level controlled Vital Signs Assessment: post-procedure vital signs reviewed and stable Respiratory status: spontaneous breathing, nonlabored ventilation and respiratory function stable Cardiovascular status: blood pressure returned to baseline and stable Postop Assessment: no apparent nausea or vomiting Anesthetic complications: no     Last Vitals:  Vitals:   12/03/19 1140 12/03/19 1150  BP: 138/88 121/76  Pulse: 96 94  Resp: (!) 24 18  Temp:    SpO2: 97%     Last Pain:  Vitals:   12/03/19 1150  TempSrc:   PainSc: 0-No pain                 Brett Canales Otha Rickles

## 2019-12-07 ENCOUNTER — Other Ambulatory Visit: Payer: Self-pay | Admitting: Physician Assistant

## 2019-12-08 ENCOUNTER — Other Ambulatory Visit: Payer: Self-pay | Admitting: Physician Assistant

## 2019-12-08 DIAGNOSIS — I10 Essential (primary) hypertension: Secondary | ICD-10-CM

## 2019-12-16 ENCOUNTER — Other Ambulatory Visit
Admission: RE | Admit: 2019-12-16 | Discharge: 2019-12-16 | Disposition: A | Discharge status: Home / Self Care | Payer: Medicare PPO | Source: Ambulatory Visit | Attending: Psychiatry | Admitting: Psychiatry

## 2019-12-16 DIAGNOSIS — Z20822 Contact with and (suspected) exposure to covid-19: Secondary | ICD-10-CM | POA: Insufficient documentation

## 2019-12-16 DIAGNOSIS — Z01812 Encounter for preprocedural laboratory examination: Secondary | ICD-10-CM | POA: Insufficient documentation

## 2019-12-16 LAB — SARS CORONAVIRUS 2 (TAT 6-24 HRS): SARS Coronavirus 2: NEGATIVE

## 2019-12-20 ENCOUNTER — Encounter: Payer: Self-pay | Admitting: Anesthesiology

## 2019-12-20 ENCOUNTER — Other Ambulatory Visit: Payer: Self-pay | Admitting: Psychiatry

## 2019-12-20 ENCOUNTER — Other Ambulatory Visit: Payer: Self-pay

## 2019-12-20 ENCOUNTER — Encounter
Admission: RE | Admit: 2019-12-20 | Discharge: 2019-12-20 | Disposition: A | Discharge status: Home / Self Care | Payer: Medicare PPO | Source: Ambulatory Visit | Attending: Psychiatry | Admitting: Psychiatry

## 2019-12-20 DIAGNOSIS — E7800 Pure hypercholesterolemia, unspecified: Secondary | ICD-10-CM | POA: Insufficient documentation

## 2019-12-20 DIAGNOSIS — F329 Major depressive disorder, single episode, unspecified: Secondary | ICD-10-CM | POA: Insufficient documentation

## 2019-12-20 DIAGNOSIS — I10 Essential (primary) hypertension: Secondary | ICD-10-CM | POA: Diagnosis not present

## 2019-12-20 DIAGNOSIS — E1142 Type 2 diabetes mellitus with diabetic polyneuropathy: Secondary | ICD-10-CM | POA: Insufficient documentation

## 2019-12-20 DIAGNOSIS — E1140 Type 2 diabetes mellitus with diabetic neuropathy, unspecified: Secondary | ICD-10-CM | POA: Diagnosis not present

## 2019-12-20 DIAGNOSIS — K219 Gastro-esophageal reflux disease without esophagitis: Secondary | ICD-10-CM | POA: Diagnosis not present

## 2019-12-20 DIAGNOSIS — E669 Obesity, unspecified: Secondary | ICD-10-CM | POA: Diagnosis not present

## 2019-12-20 DIAGNOSIS — Z20828 Contact with and (suspected) exposure to other viral communicable diseases: Secondary | ICD-10-CM | POA: Diagnosis not present

## 2019-12-20 LAB — GLUCOSE, CAPILLARY: Glucose-Capillary: 105 mg/dL — ABNORMAL HIGH (ref 70–99)

## 2019-12-20 MED ORDER — LABETALOL HCL 5 MG/ML IV SOLN
INTRAVENOUS | Status: AC
  Filled 2019-12-20: qty 4

## 2019-12-20 MED ORDER — LABETALOL HCL 5 MG/ML IV SOLN
INTRAVENOUS | Status: DC | PRN
  Administered 2019-12-20: 30 mg via INTRAVENOUS

## 2019-12-20 MED ORDER — GLYCOPYRROLATE 0.2 MG/ML IJ SOLN
0.4000 mg | Freq: Once | INTRAMUSCULAR | Status: AC
  Administered 2019-12-20: 09:00:00 0.4 mg via INTRAVENOUS

## 2019-12-20 MED ORDER — KETOROLAC TROMETHAMINE 30 MG/ML IJ SOLN
30.0000 mg | Freq: Once | INTRAMUSCULAR | Status: AC
  Administered 2019-12-20: 30 mg via INTRAVENOUS

## 2019-12-20 MED ORDER — ESMOLOL HCL 100 MG/10ML IV SOLN
INTRAVENOUS | Status: AC
  Filled 2019-12-20: qty 10

## 2019-12-20 MED ORDER — KETOROLAC TROMETHAMINE 30 MG/ML IJ SOLN
INTRAMUSCULAR | Status: AC
  Filled 2019-12-20: qty 1

## 2019-12-20 MED ORDER — SODIUM CHLORIDE 0.9 % IV SOLN: INTRAVENOUS | Status: DC | PRN

## 2019-12-20 MED ORDER — SODIUM CHLORIDE 0.9 % IV SOLN
500.0000 mL | Freq: Once | INTRAVENOUS | Status: AC
  Administered 2019-12-20: 09:00:00 500 mL via INTRAVENOUS

## 2019-12-20 MED ORDER — METHOHEXITAL SODIUM 0.5 G IJ SOLR
INTRAMUSCULAR | Status: AC
  Filled 2019-12-20: qty 500

## 2019-12-20 MED ORDER — METHOHEXITAL SODIUM 100 MG/10ML IV SOSY
PREFILLED_SYRINGE | INTRAVENOUS | Status: DC | PRN
  Administered 2019-12-20: 70 mg via INTRAVENOUS

## 2019-12-20 MED ORDER — ESMOLOL HCL 100 MG/10ML IV SOLN
INTRAVENOUS | Status: DC | PRN
  Administered 2019-12-20: 20 ug via INTRAVENOUS

## 2019-12-20 MED ORDER — GLYCOPYRROLATE 0.2 MG/ML IJ SOLN
INTRAMUSCULAR | Status: AC
  Filled 2019-12-20: qty 2

## 2019-12-20 MED ORDER — SUCCINYLCHOLINE CHLORIDE 20 MG/ML IJ SOLN
INTRAMUSCULAR | Status: DC | PRN
  Administered 2019-12-20: 100 mg via INTRAVENOUS

## 2019-12-20 NOTE — Transfer of Care (Signed)
Immediate Anesthesia Transfer of Care Note  Patient: Melinda [name]  Procedure(s) Performed: ECT TX  Patient Location: PACU  Anesthesia Type:General  Level of Consciousness: drowsy and patient cooperative  Airway & Oxygen Therapy: Patient Spontanous Breathing  Post-op Assessment: Report given to RN and Post -op Vital signs reviewed and stable  Post vital signs: Reviewed and stable  Last Vitals:  Vitals Value Taken Time  BP 137/81 12/20/19 1103  Temp 37.1 C 12/20/19 1057  Pulse 99 12/20/19 1103  Resp 19 12/20/19 1103  SpO2 95 % 12/20/19 1103  Vitals shown include unvalidated device data.  Last Pain:  Vitals:   12/20/19 1057  TempSrc:   PainSc: 0-No pain         Complications: No apparent anesthesia complications

## 2019-12-20 NOTE — Anesthesia Postprocedure Evaluation (Signed)
Anesthesia Post Note  Patient: Melinda [name]  Procedure(s) Performed: ECT TX  Patient location during evaluation: PACU Anesthesia Type: General Level of consciousness: awake and alert Pain management: pain level controlled Vital Signs Assessment: post-procedure vital signs reviewed and stable Respiratory status: spontaneous breathing, nonlabored ventilation and respiratory function stable Cardiovascular status: blood pressure returned to baseline and stable Postop Assessment: no signs of nausea or vomiting Anesthetic complications: no     Last Vitals:  Vitals:   12/20/19 1108 12/20/19 1123  BP: 130/86   Pulse: 100 97  Resp: 20 18  Temp:  36.7 C  SpO2: 93%     Last Pain:  Vitals:   12/20/19 1123  TempSrc: Oral  PainSc: 0-No pain                 Luisdaniel Kenton

## 2019-12-20 NOTE — H&P (Signed)
Melinda [name] is an 49 y.o. female.   Chief Complaint: stable chronic depression HPI: recurrent depression kept at [facility] with ECT  Past Medical History:  Diagnosis Date  . Depression   . Diabetes mellitus without complication ([place])   . Diabetic peripheral neuropathy ([place]) 03/07/15  . Diabetic peripheral neuropathy ([place]) 03/07/15  . Diabetic peripheral neuropathy ([place]) 03/07/15  . GERD (gastroesophageal reflux disease)   . Hypercholesterolemia 03/07/15  . Hypertension   . Obesity 03/07/15  . Personality disorder ([place]) 03/07/15  . Sinus tachycardia 03/07/15   history of  . Suicidal thoughts 03/07/15    Past Surgical History:  Procedure Laterality Date  . COLONOSCOPY WITH PROPOFOL N/A 12/01/2018   Procedure: COLONOSCOPY WITH PROPOFOL;  Surgeon: Jonathon Bellows, MD;  Location: [facility] ENDOSCOPY;  Service: Gastroenterology;  Laterality: N/A;  . electroconvulsion therapy  03/07/15    Family History  Problem Relation Age of Onset  . Hypertension Father   . Diabetes Mother    Social History:  reports that she has never smoked. She has never used smokeless tobacco. She reports that she does not drink alcohol or use drugs.  Allergies:  Allergies  Allergen Reactions  . Prednisone     Increases blood sugar    (Not in a hospital admission)   Results for orders placed or performed during the hospital encounter of 12/20/19 (from the past 48 hour(s))  Glucose, capillary     Status: Abnormal   Collection Time: 12/20/19  9:37 AM  Result Value Ref Range   Glucose-Capillary 105 (H) 70 - 99 mg/dL   No results found.  Review of Systems  Constitutional: Negative.   HENT: Negative.   Eyes: Negative.   Respiratory: Negative.   Cardiovascular: Negative.   Gastrointestinal: Negative.   Musculoskeletal: Negative.   Skin: Negative.   Neurological: Negative.   Psychiatric/Behavioral: Negative.     Blood pressure (!) 146/76, pulse 89, temperature 98.8 F (37.1 C), temperature source Oral, resp.  rate 18, SpO2 98 %. Physical Exam  Nursing note and vitals reviewed. Constitutional: She appears well-developed and well-nourished.  HENT:  Head: Normocephalic and atraumatic.  Eyes: Pupils are equal, round, and reactive to light. Conjunctivae are normal.  Cardiovascular: Regular rhythm and normal heart sounds.  Respiratory: Effort normal.  GI: Soft.  Musculoskeletal:        General: Normal range of motion.     Cervical back: Normal range of motion.  Neurological: She is alert.  Skin: Skin is warm and dry.  Psychiatric: She has a normal mood and affect. Her behavior is normal. Judgment and thought content normal.     Assessment/Plan Continue approx q 2week  [name], MD 12/20/2019, 10:01 AM

## 2019-12-20 NOTE — Anesthesia Preprocedure Evaluation (Signed)
Anesthesia Evaluation  Patient identified by MRN, date of birth, ID band Patient awake    Reviewed: Allergy & Precautions, H&P , NPO status , Patient's Chart, lab work & pertinent test results  History of Anesthesia Complications Negative for: history of anesthetic complications  Airway Mallampati: II  TM Distance: >3 FB Neck ROM: full    Dental  (+) Poor Dentition, Chipped   Pulmonary sleep apnea , neg COPD,    Pulmonary exam normal breath sounds clear to auscultation       Cardiovascular hypertension, Pt. on medications (-) CAD and (-) Past MI negative cardio ROS Normal cardiovascular exam Rhythm:regular Rate:Normal     Neuro/Psych PSYCHIATRIC DISORDERS Depression Bipolar Disorder  Neuromuscular disease negative neurological ROS     GI/Hepatic negative GI ROS, Neg liver ROS, GERD  Medicated,  Endo/Other  diabetes, Type 2, Oral Hypoglycemic Agents  Renal/GU negative Renal ROS  negative genitourinary   Musculoskeletal   Abdominal (+) + obese,   Peds  Hematology negative hematology ROS (+)   Anesthesia Other Findings Past Medical History: No date: Depression No date: Diabetes mellitus without complication (HCC) 2/56/38: Diabetic peripheral neuropathy ([place]) 03/07/15: Diabetic peripheral neuropathy ([place]) 03/07/15: Diabetic peripheral neuropathy (HCC) No date: GERD (gastroesophageal reflux disease) 03/07/15: Hypercholesterolemia No date: Hypertension 03/07/15: Obesity 03/07/15: Personality disorder 03/07/15: Sinus tachycardia ([place])     Comment: history of 03/07/15: Suicidal thoughts Past Surgical History: 03/07/15: electroconvulsion therapy BMI    Body Mass Index:  36.44 kg/m     Reproductive/Obstetrics                             Anesthesia Physical  Anesthesia Plan  ASA: III  Anesthesia Plan: General   Post-op Pain Management:    Induction: Intravenous  PONV Risk Score and  Plan: 2 and Ondansetron  Airway Management Planned: Mask  Additional Equipment:   Intra-op Plan:   Post-operative Plan:   Informed Consent: I have reviewed the patients History and Physical, chart, labs and discussed the procedure including the risks, benefits and alternatives for the proposed anesthesia with the patient or authorized representative who has indicated his/her understanding and acceptance.     Dental Advisory Given  Plan Discussed with: CRNA and Anesthesiologist  Anesthesia Plan Comments:         Anesthesia Quick Evaluation

## 2019-12-20 NOTE — Discharge Instructions (Signed)
1)  The drugs that you have been given will stay in your system until tomorrow so for the       next 24 hours you should not:  A. Drive an automobile  B. Make any legal decisions  C. Drink any alcoholic beverages  2)  You may resume your regular meals upon return home.  3)  A responsible adult must take you home.  Someone should stay with you for a few          hours, then be available by phone for the remainder of the treatment day.  4)  You May experience any of the following symptoms:  Headache, Nausea and a dry mouth (due to the medications you were given),  temporary memory loss and some confusion, or sore muscles (a warm bath  should help this).  If you you experience any of these symptoms let us know on                your return visit.  5)  Report any of the following: any acute discomfort, severe headache, or temperature        greater than 100.5 F.   Also report any unusual redness, swelling, drainage, or pain         at your IV site.    You may report Symptoms to:  [place] at [facility]          Phone: [phone], ECT Department           or Dr. Prescott Gum office [phone]  6)  Your next ECT Treatment is Friday February 26 at 8:30   We will call 2 days prior to your scheduled appointment for arrival times.  7)  Nothing to eat or drink after midnight the night before your procedure.  8)  Take Lisinopril with a sip of water the morning of your procedure.  9)  Other Instructions: Call [phone] to cancel the morning of your procedure due         to illness or emergency.  10) We will call within 72 hours to assess how you are feeling.

## 2019-12-28 ENCOUNTER — Other Ambulatory Visit: Payer: Self-pay | Admitting: Physician Assistant

## 2019-12-28 DIAGNOSIS — E119 Type 2 diabetes mellitus without complications: Secondary | ICD-10-CM

## 2019-12-29 ENCOUNTER — Other Ambulatory Visit: Payer: Self-pay | Admitting: Physician Assistant

## 2020-01-03 ENCOUNTER — Telehealth: Payer: Self-pay

## 2020-01-05 ENCOUNTER — Other Ambulatory Visit
Admission: RE | Admit: 2020-01-05 | Discharge: 2020-01-05 | Disposition: A | Discharge status: Home / Self Care | Payer: Medicare PPO | Source: Ambulatory Visit | Attending: Psychiatry | Admitting: Psychiatry

## 2020-01-05 DIAGNOSIS — Z01812 Encounter for preprocedural laboratory examination: Secondary | ICD-10-CM | POA: Insufficient documentation

## 2020-01-05 DIAGNOSIS — Z20822 Contact with and (suspected) exposure to covid-19: Secondary | ICD-10-CM | POA: Diagnosis not present

## 2020-01-05 LAB — SARS CORONAVIRUS 2 (TAT 6-24 HRS): SARS Coronavirus 2: NEGATIVE

## 2020-01-06 ENCOUNTER — Other Ambulatory Visit: Payer: Self-pay | Admitting: Psychiatry

## 2020-01-07 ENCOUNTER — Encounter (HOSPITAL_BASED_OUTPATIENT_CLINIC_OR_DEPARTMENT_OTHER)
Admission: RE | Admit: 2020-01-07 | Discharge: 2020-01-07 | Disposition: A | Discharge status: Home / Self Care | Payer: Medicare PPO | Source: Ambulatory Visit | Attending: Psychiatry | Admitting: Psychiatry

## 2020-01-07 ENCOUNTER — Encounter: Payer: Self-pay | Admitting: Registered Nurse

## 2020-01-07 ENCOUNTER — Other Ambulatory Visit: Payer: Self-pay

## 2020-01-07 DIAGNOSIS — E669 Obesity, unspecified: Secondary | ICD-10-CM | POA: Diagnosis not present

## 2020-01-07 DIAGNOSIS — F329 Major depressive disorder, single episode, unspecified: Secondary | ICD-10-CM | POA: Diagnosis not present

## 2020-01-07 DIAGNOSIS — I10 Essential (primary) hypertension: Secondary | ICD-10-CM | POA: Diagnosis not present

## 2020-01-07 DIAGNOSIS — G4730 Sleep apnea, unspecified: Secondary | ICD-10-CM | POA: Diagnosis not present

## 2020-01-07 DIAGNOSIS — E7800 Pure hypercholesterolemia, unspecified: Secondary | ICD-10-CM | POA: Diagnosis not present

## 2020-01-07 DIAGNOSIS — E1142 Type 2 diabetes mellitus with diabetic polyneuropathy: Secondary | ICD-10-CM | POA: Diagnosis not present

## 2020-01-07 DIAGNOSIS — F332 Major depressive disorder, recurrent severe without psychotic features: Secondary | ICD-10-CM | POA: Diagnosis not present

## 2020-01-07 DIAGNOSIS — K219 Gastro-esophageal reflux disease without esophagitis: Secondary | ICD-10-CM | POA: Diagnosis not present

## 2020-01-07 DIAGNOSIS — Z20828 Contact with and (suspected) exposure to other viral communicable diseases: Secondary | ICD-10-CM | POA: Diagnosis not present

## 2020-01-07 DIAGNOSIS — F319 Bipolar disorder, unspecified: Secondary | ICD-10-CM | POA: Diagnosis not present

## 2020-01-07 LAB — GLUCOSE, CAPILLARY
Glucose-Capillary: 99 mg/dL (ref 70–99)
Glucose-Capillary: 128 mg/dL — ABNORMAL HIGH (ref 70–99)

## 2020-01-07 MED ORDER — FENTANYL CITRATE (PF) 100 MCG/2ML IJ SOLN: 25.0000 ug | INTRAMUSCULAR | Status: DC | PRN

## 2020-01-07 MED ORDER — ONDANSETRON HCL 4 MG/2ML IJ SOLN: 4.0000 mg | Freq: Once | INTRAMUSCULAR | Status: DC | PRN

## 2020-01-07 MED ORDER — LABETALOL HCL 5 MG/ML IV SOLN
INTRAVENOUS | Status: DC | PRN
  Administered 2020-01-07: 30 mg via INTRAVENOUS

## 2020-01-07 MED ORDER — KETOROLAC TROMETHAMINE 30 MG/ML IJ SOLN: 30.0000 mg | Freq: Once | INTRAMUSCULAR | Status: AC

## 2020-01-07 MED ORDER — KETOROLAC TROMETHAMINE 30 MG/ML IJ SOLN
INTRAMUSCULAR | Status: AC
  Administered 2020-01-07: 30 mg via INTRAVENOUS
  Filled 2020-01-07: qty 1

## 2020-01-07 MED ORDER — METHOHEXITAL SODIUM 100 MG/10ML IV SOSY
PREFILLED_SYRINGE | INTRAVENOUS | Status: DC | PRN
  Administered 2020-01-07: 70 mg via INTRAVENOUS

## 2020-01-07 MED ORDER — GLYCOPYRROLATE 0.2 MG/ML IJ SOLN
INTRAMUSCULAR | Status: AC
  Administered 2020-01-07: 0.4 mg via INTRAVENOUS
  Filled 2020-01-07: qty 2

## 2020-01-07 MED ORDER — SUCCINYLCHOLINE CHLORIDE 20 MG/ML IJ SOLN
INTRAMUSCULAR | Status: DC | PRN
  Administered 2020-01-07: 100 mg via INTRAVENOUS

## 2020-01-07 MED ORDER — SODIUM CHLORIDE 0.9 % IV SOLN
500.0000 mL | Freq: Once | INTRAVENOUS | Status: AC
  Administered 2020-01-07: 500 mL via INTRAVENOUS

## 2020-01-07 MED ORDER — ESMOLOL HCL 100 MG/10ML IV SOLN
INTRAVENOUS | Status: DC | PRN
  Administered 2020-01-07: 20 mg via INTRAVENOUS

## 2020-01-07 MED ORDER — GLYCOPYRROLATE 0.2 MG/ML IJ SOLN: 0.4000 mg | Freq: Once | INTRAMUSCULAR | Status: AC

## 2020-01-07 NOTE — Discharge Instructions (Addendum)
1)  The drugs that you have been given will stay in your system until tomorrow so for the       next 24 hours you should not:  A. Drive an automobile  B. Make any legal decisions  C. Drink any alcoholic beverages  2)  You may resume your regular meals upon return home.  3)  A responsible adult must take you home.  Someone should stay with you for a few          hours, then be available by phone for the remainder of the treatment day.  4)  You May experience any of the following symptoms:  Headache, Nausea and a dry mouth (due to the medications you were given),  temporary memory loss and some confusion, or sore muscles (a warm bath  should help this).  If you you experience any of these symptoms let us know on                your return visit.  5)  Report any of the following: any acute discomfort, severe headache, or temperature        greater than 100.5 F.   Also report any unusual redness, swelling, drainage, or pain         at your IV site.    You may report Symptoms to:  [place] at [facility]          Phone: [phone], ECT Department           or Dr. Prescott Gum office [phone]  6)  Your next ECT Treatment is Friday March 12 at 8:30  We will call 2 days prior to your scheduled appointment for arrival times.  7)  Nothing to eat or drink after midnight the night before your procedure.  8)  Take blood pressure medication with a sip of water the morning of your procedure.  9)  Other Instructions: Call [phone] to cancel the morning of your procedure due         to illness or emergency.  10) We will call within 72 hours to assess how you are feeling.

## 2020-01-07 NOTE — Anesthesia Procedure Notes (Signed)
Performed by: Dymon Summerhill, CRNA Pre-anesthesia Checklist: Patient identified, Emergency Drugs available, Suction available and Patient being monitored Patient Re-evaluated:Patient Re-evaluated prior to induction Oxygen Delivery Method: Circle system utilized Preoxygenation: Pre-oxygenation with 100% oxygen Induction Type: IV induction Ventilation: Mask ventilation without difficulty and Mask ventilation throughout procedure Airway Equipment and Method: Bite block Placement Confirmation: positive ETCO2 Dental Injury: Teeth and Oropharynx as per pre-operative assessment        

## 2020-01-07 NOTE — Anesthesia Postprocedure Evaluation (Signed)
Anesthesia Post Note  Patient: Melinda [name]  Procedure(s) Performed: ECT TX  Patient location during evaluation: PACU Anesthesia Type: General Level of consciousness: awake and alert Pain management: pain level controlled Vital Signs Assessment: post-procedure vital signs reviewed and stable Respiratory status: spontaneous breathing, nonlabored ventilation, respiratory function stable and patient connected to nasal cannula oxygen Cardiovascular status: blood pressure returned to baseline and stable Postop Assessment: no apparent nausea or vomiting Anesthetic complications: no     Last Vitals:  Vitals:   01/07/20 1037 01/07/20 1045  BP: (!) 131/99 113/86  Pulse: (!) 101 99  Resp: (!) 21 (!) 23  Temp: 36.8 C   SpO2: 100% 95%    Last Pain:  Vitals:   01/07/20 1037  TempSrc:   PainSc: 0-No pain                 Molli Barrows

## 2020-01-07 NOTE — Anesthesia Preprocedure Evaluation (Signed)
Anesthesia Evaluation  Patient identified by MRN, date of birth, ID band Patient awake    Reviewed: Allergy & Precautions, H&P , NPO status , Patient's Chart, lab work & pertinent test results, reviewed documented beta blocker date and time   Airway Mallampati: II   Neck ROM: full    Dental  (+) Poor Dentition   Pulmonary neg pulmonary ROS, sleep apnea and Continuous Positive Airway Pressure Ventilation ,    Pulmonary exam normal        Cardiovascular Exercise Tolerance: Good hypertension, On Medications negative cardio ROS Normal cardiovascular exam Rhythm:regular Rate:Normal     Neuro/Psych PSYCHIATRIC DISORDERS Depression Bipolar Disorder  Neuromuscular disease negative neurological ROS  negative psych ROS   GI/Hepatic negative GI ROS, Neg liver ROS, GERD  Medicated,  Endo/Other  negative endocrine ROSdiabetes, Well Controlled, Type 2, Oral Hypoglycemic AgentsMorbid obesity  Renal/GU negative Renal ROS  negative genitourinary   Musculoskeletal   Abdominal   Peds  Hematology negative hematology ROS (+)   Anesthesia Other Findings Past Medical History: No date: Depression No date: Diabetes mellitus without complication ([name]) 99991111: Diabetic peripheral neuropathy ([place]) 03/07/15: Diabetic peripheral neuropathy ([place]) 03/07/15: Diabetic peripheral neuropathy (H[facility]) No date: GERD (gastroesophageal reflux disease) 03/07/15: Hypercholesterolemia No date: Hypertension 03/07/15: Obesity 03/07/15: Personality disorder ([place]) 03/07/15: Sinus tachycardia     Comment:  history of 03/07/15: Suicidal thoughts Past Surgical History: 12/01/2018: COLONOSCOPY WITH PROPOFOL; N/A     Comment:  Procedure: COLONOSCOPY WITH PROPOFOL;  Surgeon: Jonathon Bellows, MD;  Location: [facility] ENDOSCOPY;  Service:               Gastroenterology;  Laterality: N/A; 03/07/15: electroconvulsion therapy   Reproductive/Obstetrics negative OB  ROS                             Anesthesia Physical Anesthesia Plan  ASA: III  Anesthesia Plan: General   Post-op Pain Management:    Induction:   PONV Risk Score and Plan:   Airway Management Planned:   Additional Equipment:   Intra-op Plan:   Post-operative Plan:   Informed Consent: I have reviewed the patients History and Physical, chart, labs and discussed the procedure including the risks, benefits and alternatives for the proposed anesthesia with the patient or authorized representative who has indicated his/her understanding and acceptance.     Dental Advisory Given  Plan Discussed with: CRNA  Anesthesia Plan Comments:         Anesthesia Quick Evaluation

## 2020-01-07 NOTE — H&P (Signed)
Melinda [name] is an 49 y.o. female.   Chief Complaint: no complaint HPI: recurrent depression  Past Medical History:  Diagnosis Date  . Depression   . Diabetes mellitus without complication ([place])   . Diabetic peripheral neuropathy ([place]) 03/07/15  . Diabetic peripheral neuropathy ([place]) 03/07/15  . Diabetic peripheral neuropathy ([place]) 03/07/15  . GERD (gastroesophageal reflux disease)   . Hypercholesterolemia 03/07/15  . Hypertension   . Obesity 03/07/15  . Personality disorder ([place]) 03/07/15  . Sinus tachycardia 03/07/15   history of  . Suicidal thoughts 03/07/15    Past Surgical History:  Procedure Laterality Date  . COLONOSCOPY WITH PROPOFOL N/A 12/01/2018   Procedure: COLONOSCOPY WITH PROPOFOL;  Surgeon: Jonathon Bellows, MD;  Location: [facility] ENDOSCOPY;  Service: Gastroenterology;  Laterality: N/A;  . electroconvulsion therapy  03/07/15    Family History  Problem Relation Age of Onset  . Hypertension Father   . Diabetes Mother    Social History:  reports that she has never smoked. She has never used smokeless tobacco. She reports that she does not drink alcohol or use drugs.  Allergies:  Allergies  Allergen Reactions  . Prednisone     Increases blood sugar    (Not in a hospital admission)   Results for orders placed or performed during the hospital encounter of 01/07/20 (from the past 48 hour(s))  Glucose, capillary     Status: None   Collection Time: 01/07/20  8:54 AM  Result Value Ref Range   Glucose-Capillary 99 70 - 99 mg/dL    Comment: Glucose reference range applies only to samples taken after fasting for at least 8 hours.   No results found.  Review of Systems  Constitutional: Negative.   HENT: Negative.   Eyes: Negative.   Respiratory: Negative.   Cardiovascular: Negative.   Gastrointestinal: Negative.   Musculoskeletal: Negative.   Skin: Negative.   Neurological: Negative.   Psychiatric/Behavioral: Negative.     Blood pressure (!) 146/89, pulse 96,  resp. rate 18, SpO2 98 %. Physical Exam  Nursing note and vitals reviewed. Constitutional: She appears well-developed and well-nourished.  HENT:  Head: Normocephalic and atraumatic.  Eyes: Pupils are equal, round, and reactive to light. Conjunctivae are normal.  Cardiovascular: Normal heart sounds.  Respiratory: Effort normal.  GI: Soft.  Musculoskeletal:        General: Normal range of motion.     Cervical back: Normal range of motion.  Neurological: She is alert.  Skin: Skin is warm and dry.  Psychiatric: She has a normal mood and affect. Her speech is normal and behavior is normal. Judgment and thought content normal. Cognition and memory are normal.     Assessment/Plan Doing well and stable on q2week schedule  [name], MD 01/07/2020, 9:35 AM

## 2020-01-07 NOTE — Transfer of Care (Signed)
Immediate Anesthesia Transfer of Care Note  Patient: Melinda [name]  Procedure(s) Performed: ECT TX  Patient Location: PACU  Anesthesia Type:General  Level of Consciousness: sedated  Airway & Oxygen Therapy: Patient Spontanous Breathing and Patient connected to face mask oxygen  Post-op Assessment: Report given to RN and Post -op Vital signs reviewed and stable  Post vital signs: Reviewed and stable  Last Vitals:  Vitals Value Taken Time  BP    Temp    Pulse 93 01/07/20 1034  Resp 20 01/07/20 1034  SpO2 100 % 01/07/20 1034  Vitals shown include unvalidated device data.  Last Pain:  Vitals:   01/07/20 0827  TempSrc: Oral  PainSc: 0-No pain         Complications: No apparent anesthesia complications

## 2020-01-07 NOTE — Procedures (Signed)
ECT SERVICES Physician's Interval Evaluation & Treatment Note  Patient Identification: Melinda [name] MRN:  [id number] Date of Evaluation:  01/07/2020 TX #: [id number]  MADRS:   MMSE:   P.E. Findings:  No change physical exam  Psychiatric Interval Note:  Feeling good no complaints  Subjective:  Patient is a 49 y.o. female seen for evaluation for Electroconvulsive Therapy. No depression  Treatment Summary:   []   Right Unilateral             [x]  Bilateral   % Energy : 1.0 ms 35%   Impedance: 1020 ohms  Seizure Energy Index: 3838 V squared  Postictal Suppression Index: 53%  Seizure Concordance Index: 86%  Medications  Pre Shock: Robinul 0.4 mg Toradol 30 mg labetalol 20 mg esmolol 20 mg Brevital 70 mg succinylcholine 100 mg  Post Shock:    Seizure Duration: 32 seconds EMG 61 seconds EEG   Comments: Follow-up 2 weeks  Lungs:  [x]   Clear to auscultation               []  Other:   Heart:    [x]   Regular rhythm             []  irregular rhythm    [x]   Previous H&P reviewed, patient examined and there are NO CHANGES                 []   Previous H&P reviewed, patient examined and there are changes noted.   [name], MD 2/26/202110:15 AM

## 2020-01-12 ENCOUNTER — Other Ambulatory Visit: Payer: Self-pay

## 2020-01-12 ENCOUNTER — Ambulatory Visit: Payer: Medicare PPO | Admitting: Physician Assistant

## 2020-01-12 ENCOUNTER — Encounter: Payer: Self-pay | Admitting: Physician Assistant

## 2020-01-12 VITALS — BP 132/82 | HR 100 | Temp 96.9°F | Wt 229.6 lb

## 2020-01-12 DIAGNOSIS — E119 Type 2 diabetes mellitus without complications: Secondary | ICD-10-CM | POA: Diagnosis not present

## 2020-01-12 DIAGNOSIS — F322 Major depressive disorder, single episode, severe without psychotic features: Secondary | ICD-10-CM

## 2020-01-12 DIAGNOSIS — I10 Essential (primary) hypertension: Secondary | ICD-10-CM | POA: Diagnosis not present

## 2020-01-12 DIAGNOSIS — E7800 Pure hypercholesterolemia, unspecified: Secondary | ICD-10-CM

## 2020-01-12 DIAGNOSIS — E1159 Type 2 diabetes mellitus with other circulatory complications: Secondary | ICD-10-CM | POA: Diagnosis not present

## 2020-01-12 DIAGNOSIS — I429 Cardiomyopathy, unspecified: Secondary | ICD-10-CM

## 2020-01-12 LAB — POCT GLYCOSYLATED HEMOGLOBIN (HGB A1C)
Est. average glucose Bld gHb Est-mCnc: 134
Hemoglobin A1C: 6.3 % — AB (ref 4.0–5.6)

## 2020-01-12 MED ORDER — LISINOPRIL 10 MG PO TABS: 10.0000 mg | ORAL_TABLET | Freq: Every day | ORAL | 1 refills | Status: DC

## 2020-01-12 NOTE — Progress Notes (Signed)
Patient: Melinda [name] Female    DOB: Mar 27, 1971   49 y.o.   MRN: [id number] Visit Date: 01/12/2020  Today's Provider: Trinna Post, PA-C   No chief complaint on file.  Subjective:     HPI  Diabetes Mellitus Type II, Follow-up:   Lab Results  Component Value Date   HGBA1C 6.9 (H) 09/14/2019   HGBA1C 7.0 (A) 05/13/2019   HGBA1C 7.1 (A) 02/11/2019   Last seen for diabetes 4 months ago.  Management since then includes metformin. She reports excellent compliance with treatment. She is not having side effects.  Current symptoms include none and have been unchanged. Home blood sugar records: fasting range: 90-110  Episodes of hypoglycemia? once   Current Insulin Regimen: None Most Recent Eye Exam: 11/2018 - due this year with [facility] eye Weight trend: stable Prior visit with dietician: no Current diet: in general, an "unhealthy" diet Current exercise: none  ------------------------------------------------------------------------   Hypertension, follow-up:  BP Readings from Last 3 Encounters:  09/14/19 122/81  07/05/19 120/70  06/14/19 124/80    She was last seen for hypertension 4 months ago.  BP at that visit was normotensive. Management since that visit includes lisinopril.She reports excellent compliance with treatment. She is not having side effects.  She is not exercising. She is not adherent to low salt diet.   Outside blood pressures are not checked. She is experiencing none.  Patient denies chest pain and syncope.   Cardiovascular risk factors include diabetes mellitus, dyslipidemia, hypertension, obesity (BMI >= 30 kg/m2) and sedentary lifestyle.  Use of agents associated with hypertension: none.   ------------------------------------------------------------------------    Lipid/Cholesterol, Follow-up:   Last seen for this 4 months ago.  Management since that visit includes lipitor 10 mg QHS.  Last Lipid Panel:    Component Value  Date/Time   CHOL 130 09/14/2019 0844   TRIG 59 09/14/2019 0844   HDL 47 09/14/2019 0844   CHOLHDL 2.8 09/14/2019 0844   LDLCALC 70 09/14/2019 0844    She reports excellent compliance with treatment. She is not having side effects.   Wt Readings from Last 3 Encounters:  09/14/19 238 lb 12.8 oz (108.3 kg)  07/05/19 246 lb (111.6 kg)  06/14/19 249 lb (112.9 kg)    ------------------------------------------------------------------------     Allergies  Allergen Reactions  . Prednisone     Increases blood sugar     Current Outpatient Medications:  .  Accu-Chek Softclix Lancets lancets, USE AS DIRECTED, Disp: 100 each, Rfl: 2 .  Alcohol Swabs (B-D SINGLE USE SWABS REGULAR) PADS, USE AS DIRECTED, Disp: 300 each, Rfl: 0 .  atorvastatin (LIPITOR) 10 MG tablet, TAKE 1 TABLET AT BEDTIME, Disp: 90 tablet, Rfl: 1 .  buPROPion (WELLBUTRIN XL) 300 MG 24 hr tablet, TAKE 1 TABLET EVERY DAY, Disp: 90 tablet, Rfl: 1 .  escitalopram (LEXAPRO) 20 MG tablet, TAKE 1 TABLET EVERY DAY, Disp: 90 tablet, Rfl: 1 .  glucose blood (ACCU-CHEK AVIVA PLUS) test strip, Use as instructed, Disp: 100 each, Rfl: 12 .  Lancets Misc. (ACCU-CHEK FASTCLIX LANCET) KIT, , Disp: , Rfl:  .  lisinopril (ZESTRIL) 10 MG tablet, TAKE 1 TABLET EVERY DAY, Disp: 90 tablet, Rfl: 0 .  metFORMIN (GLUCOPHAGE-XR) 500 MG 24 hr tablet, TAKE 1 TABLET TWICE DAILY, Disp: 180 tablet, Rfl: 1 .  ziprasidone (GEODON) 80 MG capsule, TAKE 1 CAPSULE TWO TIMES DAILY WITH A MEAL., Disp: 180 capsule, Rfl: 1  Review of Systems  Social History  Tobacco Use  . Smoking status: Never Smoker  . Smokeless tobacco: Never Used  Substance Use Topics  . Alcohol use: No      Objective:   LMP  (LMP Unknown) Comment: pt states she has not had period in the past year There were no vitals filed for this visit.There is no height or weight on file to calculate BMI.   Physical Exam Constitutional:      Appearance: Normal appearance.    Cardiovascular:     Rate and Rhythm: Normal rate.     Heart sounds: Normal heart sounds.  Pulmonary:     Effort: Pulmonary effort is normal.     Breath sounds: Normal breath sounds.  Skin:    General: Skin is warm and dry.  Neurological:     Mental Status: She is alert and oriented to person, place, and time. Mental status is at baseline.  Psychiatric:        Mood and Affect: Mood normal.        Behavior: Behavior normal.      No results found for any visits on 01/12/20.     Assessment & Plan    1. Controlled type 2 diabetes mellitus without complication, without long-term current use of insulin ([place])  Reviewed her fasting blood sugars from her meters which range from 70-110. Her a1c is 6.3% which is great. Due for eye exam, referral placed.   - POCT glycosylated hemoglobin (Hb A1C) - Ambulatory referral to Ophthalmology  2. Essential hypertension  Continue.  - lisinopril (ZESTRIL) 10 MG tablet; Take 1 tablet (10 mg total) by mouth daily.  Dispense: 90 tablet; Refill: 1  3. Cardiomyopathy, unspecified type ([place])   4. Morbid obesity ([place])   5. Hypertension associated with diabetes ([facility])  Continue lisinopril 10 mg QD.  6. Hypercholesteremia  Continue lipitor.  7. Severe depression ([place])  Followed by psychiatry with ECT.  8. Essential hypertension  - lisinopril (ZESTRIL) 10 MG tablet; Take 1 tablet (10 mg total) by mouth daily.  Dispense: 90 tablet; Refill: [place], PA-C  [facility]

## 2020-01-19 ENCOUNTER — Other Ambulatory Visit
Admission: RE | Admit: 2020-01-19 | Discharge: 2020-01-19 | Disposition: A | Discharge status: Home / Self Care | Payer: Medicare PPO | Source: Ambulatory Visit | Attending: Psychiatry | Admitting: Psychiatry

## 2020-01-19 DIAGNOSIS — Z20822 Contact with and (suspected) exposure to covid-19: Secondary | ICD-10-CM | POA: Diagnosis not present

## 2020-01-19 DIAGNOSIS — Z01812 Encounter for preprocedural laboratory examination: Secondary | ICD-10-CM | POA: Insufficient documentation

## 2020-01-19 LAB — SARS CORONAVIRUS 2 (TAT 6-24 HRS): SARS Coronavirus 2: NEGATIVE

## 2020-01-20 ENCOUNTER — Other Ambulatory Visit: Payer: Self-pay | Admitting: Psychiatry

## 2020-01-21 ENCOUNTER — Other Ambulatory Visit: Payer: Self-pay

## 2020-01-21 ENCOUNTER — Encounter: Payer: Self-pay | Admitting: Anesthesiology

## 2020-01-21 ENCOUNTER — Encounter
Admission: RE | Admit: 2020-01-21 | Discharge: 2020-01-21 | Disposition: A | Discharge status: Home / Self Care | Payer: Medicare PPO | Source: Ambulatory Visit | Attending: Psychiatry | Admitting: Psychiatry

## 2020-01-21 DIAGNOSIS — F329 Major depressive disorder, single episode, unspecified: Secondary | ICD-10-CM | POA: Diagnosis not present

## 2020-01-21 DIAGNOSIS — F332 Major depressive disorder, recurrent severe without psychotic features: Secondary | ICD-10-CM

## 2020-01-21 DIAGNOSIS — F322 Major depressive disorder, single episode, severe without psychotic features: Secondary | ICD-10-CM | POA: Diagnosis not present

## 2020-01-21 DIAGNOSIS — E1142 Type 2 diabetes mellitus with diabetic polyneuropathy: Secondary | ICD-10-CM | POA: Insufficient documentation

## 2020-01-21 DIAGNOSIS — Z20828 Contact with and (suspected) exposure to other viral communicable diseases: Secondary | ICD-10-CM | POA: Insufficient documentation

## 2020-01-21 DIAGNOSIS — I10 Essential (primary) hypertension: Secondary | ICD-10-CM | POA: Insufficient documentation

## 2020-01-21 DIAGNOSIS — E7800 Pure hypercholesterolemia, unspecified: Secondary | ICD-10-CM | POA: Insufficient documentation

## 2020-01-21 DIAGNOSIS — Z6839 Body mass index (BMI) 39.0-39.9, adult: Secondary | ICD-10-CM | POA: Diagnosis not present

## 2020-01-21 DIAGNOSIS — E1140 Type 2 diabetes mellitus with diabetic neuropathy, unspecified: Secondary | ICD-10-CM | POA: Diagnosis not present

## 2020-01-21 DIAGNOSIS — E669 Obesity, unspecified: Secondary | ICD-10-CM | POA: Diagnosis not present

## 2020-01-21 DIAGNOSIS — K219 Gastro-esophageal reflux disease without esophagitis: Secondary | ICD-10-CM | POA: Diagnosis not present

## 2020-01-21 LAB — GLUCOSE, CAPILLARY: Glucose-Capillary: 81 mg/dL (ref 70–99)

## 2020-01-21 MED ORDER — KETOROLAC TROMETHAMINE 30 MG/ML IJ SOLN
INTRAMUSCULAR | Status: AC
  Administered 2020-01-21: 30 mg via INTRAVENOUS
  Filled 2020-01-21: qty 1

## 2020-01-21 MED ORDER — GLYCOPYRROLATE 0.2 MG/ML IJ SOLN: 0.4000 mg | Freq: Once | INTRAMUSCULAR | Status: AC

## 2020-01-21 MED ORDER — SODIUM CHLORIDE 0.9 % IV SOLN: 500.0000 mL | Freq: Once | INTRAVENOUS | Status: AC

## 2020-01-21 MED ORDER — ESMOLOL HCL 100 MG/10ML IV SOLN
INTRAVENOUS | Status: DC | PRN
  Administered 2020-01-21: 20 mg via INTRAVENOUS

## 2020-01-21 MED ORDER — LABETALOL HCL 5 MG/ML IV SOLN
INTRAVENOUS | Status: DC | PRN
  Administered 2020-01-21: 40 mg via INTRAVENOUS

## 2020-01-21 MED ORDER — METHOHEXITAL SODIUM 100 MG/10ML IV SOSY
PREFILLED_SYRINGE | INTRAVENOUS | Status: DC | PRN
  Administered 2020-01-21: 70 mg via INTRAVENOUS

## 2020-01-21 MED ORDER — GLYCOPYRROLATE 0.2 MG/ML IJ SOLN
INTRAMUSCULAR | Status: AC
  Administered 2020-01-21: 0.4 mg via INTRAVENOUS
  Filled 2020-01-21: qty 2

## 2020-01-21 MED ORDER — SUCCINYLCHOLINE CHLORIDE 200 MG/10ML IV SOSY
PREFILLED_SYRINGE | INTRAVENOUS | Status: DC | PRN
  Administered 2020-01-21: 100 mg via INTRAVENOUS

## 2020-01-21 MED ORDER — ONDANSETRON HCL 4 MG/2ML IJ SOLN: 4.0000 mg | Freq: Once | INTRAMUSCULAR | Status: DC | PRN

## 2020-01-21 MED ORDER — KETOROLAC TROMETHAMINE 30 MG/ML IJ SOLN: 30.0000 mg | Freq: Once | INTRAMUSCULAR | Status: AC

## 2020-01-21 NOTE — Transfer of Care (Signed)
Immediate Anesthesia Transfer of Care Note  Patient: Melinda [name]  Procedure(s) Performed: ECT TX  Patient Location: PACU  Anesthesia Type:General  Level of Consciousness: awake  Airway & Oxygen Therapy: Patient connected to face mask oxygen  Post-op Assessment: Post -op Vital signs reviewed and stable  Post vital signs: stable  Last Vitals:  Vitals Value Taken Time  BP    Temp    Pulse 100 01/21/20 1113  Resp 21 01/21/20 1113  SpO2 99 % 01/21/20 1113  Vitals shown include unvalidated device data.  Last Pain:  Vitals:   01/21/20 1111  TempSrc:   PainSc: (P) 0-No pain         Complications: No apparent anesthesia complications

## 2020-01-21 NOTE — Anesthesia Postprocedure Evaluation (Signed)
Anesthesia Post Note  Patient: Melinda [name]  Procedure(s) Performed: ECT TX  Patient location during evaluation: PACU Anesthesia Type: General Level of consciousness: awake and alert Pain management: pain level controlled Vital Signs Assessment: post-procedure vital signs reviewed and stable Respiratory status: spontaneous breathing, nonlabored ventilation, respiratory function stable and patient connected to nasal cannula oxygen Cardiovascular status: blood pressure returned to baseline and stable Postop Assessment: no apparent nausea or vomiting Anesthetic complications: no     Last Vitals:  Vitals:   01/21/20 0841 01/21/20 1111  BP: (!) 143/73   Pulse:  (!) 106  Resp:  (!) 21  Temp:  36.8 C  SpO2:  100%    Last Pain:  Vitals:   01/21/20 1111  TempSrc:   PainSc: 0-No pain                 Arita Miss

## 2020-01-21 NOTE — Anesthesia Preprocedure Evaluation (Signed)
Anesthesia Evaluation  Patient identified by MRN, date of birth, ID band Patient awake    Reviewed: Allergy & Precautions, NPO status , Patient's Chart, lab work & pertinent test results  History of Anesthesia Complications Negative for: history of anesthetic complications  Airway Mallampati: III  TM Distance: >3 FB Neck ROM: Full  Mouth opening: Limited Mouth Opening  Dental no notable dental hx. (+) Teeth Intact   Pulmonary sleep apnea and Continuous Positive Airway Pressure Ventilation , neg COPD, Patient abstained from smoking.Not current smoker,    Pulmonary exam normal breath sounds clear to auscultation       Cardiovascular Exercise Tolerance: Good METShypertension, (-) CAD and (-) Past MI (-) dysrhythmias  Rhythm:Regular Rate:Normal - Systolic murmurs    Neuro/Psych PSYCHIATRIC DISORDERS Depression Bipolar Disorder negative neurological ROS     GI/Hepatic GERD  Controlled,(+)     (-) substance abuse  ,   Endo/Other  diabetes  Renal/GU negative Renal ROS     Musculoskeletal   Abdominal   Peds  Hematology   Anesthesia Other Findings Past Medical History: No date: Depression No date: Diabetes mellitus without complication ([place]) 99991111: Diabetic peripheral neuropathy ([place]) 03/07/15: Diabetic peripheral neuropathy ([place]) 03/07/15: Diabetic peripheral neuropathy (HCC) No date: GERD (gastroesophageal reflux disease) 03/07/15: Hypercholesterolemia No date: Hypertension 03/07/15: Obesity 03/07/15: Personality disorder ([place]) 03/07/15: Sinus tachycardia     Comment:  history of 03/07/15: Suicidal thoughts  Reproductive/Obstetrics                             Anesthesia Physical Anesthesia Plan  ASA: III  Anesthesia Plan: General   Post-op Pain Management:    Induction: Intravenous  PONV Risk Score and Plan: 2 and Ondansetron and TIVA  Airway Management Planned: Mask  Additional  Equipment: None  Intra-op Plan:   Post-operative Plan:   Informed Consent: I have reviewed the patients History and Physical, chart, labs and discussed the procedure including the risks, benefits and alternatives for the proposed anesthesia with the patient or authorized representative who has indicated his/her understanding and acceptance.     Dental advisory given  Plan Discussed with: CRNA and Surgeon  Anesthesia Plan Comments: (Discussed risks of anesthesia with patient, including PONV, muscle aches. Rare risks discussed as well, such as cardiorespiratory sequelae, need for airway intervention. Patient understands.)        Anesthesia Quick Evaluation

## 2020-01-21 NOTE — Discharge Instructions (Signed)
1)  The drugs that you have been given will stay in your system until tomorrow so for the       next 24 hours you should not:  A. Drive an automobile  B. Make any legal decisions  C. Drink any alcoholic beverages  2)  You may resume your regular meals upon return home.  3)  A responsible adult must take you home.  Someone should stay with you for a few          hours, then be available by phone for the remainder of the treatment day.  4)  You May experience any of the following symptoms:  Headache, Nausea and a dry mouth (due to the medications you were given),  temporary memory loss and some confusion, or sore muscles (a warm bath  should help this).  If you you experience any of these symptoms let us know on                your return visit.  5)  Report any of the following: any acute discomfort, severe headache, or temperature        greater than 100.5 F.   Also report any unusual redness, swelling, drainage, or pain         at your IV site.    You may report Symptoms to:  [place] [facility]          Phone: [phone], ECT Department           or Dr. Prescott Gum office [phone]  6)  Your next ECT Treatment is Friday March 26  We will call 2 days prior to your scheduled appointment for arrival times.  7)  Nothing to eat or drink after midnight the night before your procedure.  8)  Take lisinopril with a sip of water the morning of your procedure.  9)  Other Instructions: Call [phone] to cancel the morning of your procedure due         to illness or emergency.  10) We will call within 72 hours to assess how you are feeling.

## 2020-01-21 NOTE — Procedures (Signed)
ECT SERVICES Physician's Interval Evaluation & Treatment Note  Patient Identification: Melinda [name] MRN:  [id number] Date of Evaluation:  01/21/2020 TX #: [id number]  MADRS:   MMSE:   P.E. Findings:  No change physical exam  Psychiatric Interval Note:  Usual chronic dysphoria but certainly nothing worse than usual  Subjective:  Patient is a 49 y.o. female seen for evaluation for Electroconvulsive Therapy. Stable no suicidal ideation or psychosis  Treatment Summary:   []   Right Unilateral             [x]  Bilateral   % Energy : 1.0 ms 35%   Impedance: 980 ohms  Seizure Energy Index: 3791 V squared  Postictal Suppression Index: 84%  Seizure Concordance Index: 95%  Medications  Pre Shock: Robinul 0.4 mg Toradol 30 mg labetalol 20 mg esmolol 20 mg Brevital 70 mg succinylcholine 100 mg  Post Shock:    Seizure Duration: 34 seconds EMG 78 seconds EEG   Comments: Follow-up 2 weeks  Lungs:  [x]   Clear to auscultation               []  Other:   Heart:    [x]   Regular rhythm             []  irregular rhythm    [x]   Previous H&P reviewed, patient examined and there are NO CHANGES                 []   Previous H&P reviewed, patient examined and there are changes noted.   [name], MD 3/12/20215:10 PM

## 2020-01-21 NOTE — H&P (Signed)
Melinda [name] is an 49 y.o. female.   Chief Complaint: stable HPI: chronic depression  Past Medical History:  Diagnosis Date  . Depression   . Diabetes mellitus without complication ([place])   . Diabetic peripheral neuropathy ([place]) 03/07/15  . Diabetic peripheral neuropathy ([place]) 03/07/15  . Diabetic peripheral neuropathy ([place]) 03/07/15  . GERD (gastroesophageal reflux disease)   . Hypercholesterolemia 03/07/15  . Hypertension   . Obesity 03/07/15  . Personality disorder ([place]) 03/07/15  . Sinus tachycardia 03/07/15   history of  . Suicidal thoughts 03/07/15    Past Surgical History:  Procedure Laterality Date  . COLONOSCOPY WITH PROPOFOL N/A 12/01/2018   Procedure: COLONOSCOPY WITH PROPOFOL;  Surgeon: Jonathon Bellows, MD;  Location: [facility] ENDOSCOPY;  Service: Gastroenterology;  Laterality: N/A;  . electroconvulsion therapy  03/07/15    Family History  Problem Relation Age of Onset  . Hypertension Father   . Diabetes Mother    Social History:  reports that she has never smoked. She has never used smokeless tobacco. She reports that she does not drink alcohol or use drugs.  Allergies:  Allergies  Allergen Reactions  . Prednisone     Increases blood sugar    (Not in a hospital admission)   Results for orders placed or performed during the hospital encounter of 01/21/20 (from the past 48 hour(s))  Glucose, capillary     Status: None   Collection Time: 01/21/20  9:27 AM  Result Value Ref Range   Glucose-Capillary 81 70 - 99 mg/dL    Comment: Glucose reference range applies only to samples taken after fasting for at least 8 hours.   Comment 1 Glucose Stabilizer    No results found.  Review of Systems  Constitutional: Negative.   HENT: Negative.   Eyes: Negative.   Respiratory: Negative.   Cardiovascular: Negative.   Gastrointestinal: Negative.   Musculoskeletal: Negative.   Skin: Negative.   Neurological: Negative.   Psychiatric/Behavioral: Negative.     Blood pressure  (!) 143/73, pulse (!) 108, temperature 99 F (37.2 C), temperature source Oral, resp. rate 18, height 5\' 4"  (1.626 m), weight 103.9 kg, SpO2 100 %. Physical Exam  Nursing note and vitals reviewed. Constitutional: She appears well-developed and well-nourished.  HENT:  Head: Normocephalic and atraumatic.  Eyes: Pupils are equal, round, and reactive to light. Conjunctivae are normal.  Cardiovascular: Regular rhythm and normal heart sounds.  Respiratory: Effort normal.  GI: Soft.  Musculoskeletal:        General: Normal range of motion.     Cervical back: Normal range of motion.  Neurological: She is alert.  Skin: Skin is warm and dry.  Psychiatric: She has a normal mood and affect. Her behavior is normal. Judgment and thought content normal.     Assessment/Plan Return 2 weeks  [name], MD 01/21/2020, 10:03 AM

## 2020-01-31 ENCOUNTER — Telehealth: Payer: Self-pay

## 2020-02-02 ENCOUNTER — Other Ambulatory Visit
Admission: RE | Admit: 2020-02-02 | Discharge: 2020-02-02 | Disposition: A | Discharge status: Home / Self Care | Payer: Medicare PPO | Source: Ambulatory Visit | Attending: Psychiatry | Admitting: Psychiatry

## 2020-02-02 ENCOUNTER — Other Ambulatory Visit: Payer: Self-pay

## 2020-02-02 DIAGNOSIS — Z20822 Contact with and (suspected) exposure to covid-19: Secondary | ICD-10-CM | POA: Diagnosis not present

## 2020-02-02 DIAGNOSIS — Z01812 Encounter for preprocedural laboratory examination: Secondary | ICD-10-CM | POA: Insufficient documentation

## 2020-02-02 LAB — SARS CORONAVIRUS 2 (TAT 6-24 HRS): SARS Coronavirus 2: NEGATIVE

## 2020-02-03 ENCOUNTER — Other Ambulatory Visit: Payer: Self-pay | Admitting: Psychiatry

## 2020-02-04 ENCOUNTER — Encounter (HOSPITAL_BASED_OUTPATIENT_CLINIC_OR_DEPARTMENT_OTHER)
Admission: RE | Admit: 2020-02-04 | Discharge: 2020-02-04 | Disposition: A | Discharge status: Home / Self Care | Payer: Medicare PPO | Source: Ambulatory Visit | Attending: Psychiatry | Admitting: Psychiatry

## 2020-02-04 ENCOUNTER — Encounter: Payer: Self-pay | Admitting: Registered Nurse

## 2020-02-04 ENCOUNTER — Other Ambulatory Visit: Payer: Self-pay

## 2020-02-04 DIAGNOSIS — Z6838 Body mass index (BMI) 38.0-38.9, adult: Secondary | ICD-10-CM | POA: Diagnosis not present

## 2020-02-04 DIAGNOSIS — G4730 Sleep apnea, unspecified: Secondary | ICD-10-CM | POA: Diagnosis not present

## 2020-02-04 DIAGNOSIS — E669 Obesity, unspecified: Secondary | ICD-10-CM | POA: Diagnosis not present

## 2020-02-04 DIAGNOSIS — E7800 Pure hypercholesterolemia, unspecified: Secondary | ICD-10-CM | POA: Diagnosis not present

## 2020-02-04 DIAGNOSIS — F329 Major depressive disorder, single episode, unspecified: Secondary | ICD-10-CM | POA: Diagnosis not present

## 2020-02-04 DIAGNOSIS — I10 Essential (primary) hypertension: Secondary | ICD-10-CM | POA: Diagnosis not present

## 2020-02-04 DIAGNOSIS — F332 Major depressive disorder, recurrent severe without psychotic features: Secondary | ICD-10-CM | POA: Diagnosis not present

## 2020-02-04 DIAGNOSIS — E1142 Type 2 diabetes mellitus with diabetic polyneuropathy: Secondary | ICD-10-CM | POA: Diagnosis not present

## 2020-02-04 DIAGNOSIS — K219 Gastro-esophageal reflux disease without esophagitis: Secondary | ICD-10-CM | POA: Diagnosis not present

## 2020-02-04 DIAGNOSIS — E1140 Type 2 diabetes mellitus with diabetic neuropathy, unspecified: Secondary | ICD-10-CM | POA: Diagnosis not present

## 2020-02-04 DIAGNOSIS — Z20828 Contact with and (suspected) exposure to other viral communicable diseases: Secondary | ICD-10-CM | POA: Diagnosis not present

## 2020-02-04 LAB — GLUCOSE, CAPILLARY: Glucose-Capillary: 91 mg/dL (ref 70–99)

## 2020-02-04 MED ORDER — ONDANSETRON HCL 4 MG/2ML IJ SOLN: 4.0000 mg | Freq: Once | INTRAMUSCULAR | Status: DC | PRN

## 2020-02-04 MED ORDER — FENTANYL CITRATE (PF) 100 MCG/2ML IJ SOLN: 25.0000 ug | INTRAMUSCULAR | Status: DC | PRN

## 2020-02-04 MED ORDER — METHOHEXITAL SODIUM 100 MG/10ML IV SOSY
PREFILLED_SYRINGE | INTRAVENOUS | Status: DC | PRN
  Administered 2020-02-04: 70 mg via INTRAVENOUS

## 2020-02-04 MED ORDER — KETOROLAC TROMETHAMINE 30 MG/ML IJ SOLN: 30.0000 mg | Freq: Once | INTRAMUSCULAR | Status: AC

## 2020-02-04 MED ORDER — SUCCINYLCHOLINE CHLORIDE 20 MG/ML IJ SOLN
INTRAMUSCULAR | Status: DC | PRN
  Administered 2020-02-04: 100 mg via INTRAVENOUS

## 2020-02-04 MED ORDER — ESMOLOL HCL 100 MG/10ML IV SOLN
INTRAVENOUS | Status: DC | PRN
  Administered 2020-02-04: 20 mg via INTRAVENOUS

## 2020-02-04 MED ORDER — LABETALOL HCL 5 MG/ML IV SOLN
INTRAVENOUS | Status: DC | PRN
  Administered 2020-02-04: 30 mg via INTRAVENOUS

## 2020-02-04 MED ORDER — SODIUM CHLORIDE 0.9 % IV SOLN
500.0000 mL | Freq: Once | INTRAVENOUS | Status: AC
  Administered 2020-02-04: 500 mL via INTRAVENOUS

## 2020-02-04 MED ORDER — GLYCOPYRROLATE 0.2 MG/ML IJ SOLN
0.4000 mg | Freq: Once | INTRAMUSCULAR | Status: AC
  Administered 2020-02-04: 0.4 mg via INTRAVENOUS

## 2020-02-04 MED ORDER — SODIUM CHLORIDE 0.9 % IV SOLN: INTRAVENOUS | Status: DC | PRN

## 2020-02-04 MED ORDER — KETOROLAC TROMETHAMINE 30 MG/ML IJ SOLN
INTRAMUSCULAR | Status: AC
  Administered 2020-02-04: 30 mg via INTRAVENOUS
  Filled 2020-02-04: qty 1

## 2020-02-04 MED ORDER — GLYCOPYRROLATE 0.2 MG/ML IJ SOLN
INTRAMUSCULAR | Status: AC
  Filled 2020-02-04: qty 2

## 2020-02-04 NOTE — Anesthesia Procedure Notes (Signed)
Performed by: Tuwanna Krausz, CRNA Pre-anesthesia Checklist: Patient identified, Emergency Drugs available, Suction available and Patient being monitored Patient Re-evaluated:Patient Re-evaluated prior to induction Oxygen Delivery Method: Circle system utilized Preoxygenation: Pre-oxygenation with 100% oxygen Induction Type: IV induction Ventilation: Mask ventilation without difficulty and Mask ventilation throughout procedure Airway Equipment and Method: Bite block Placement Confirmation: positive ETCO2 Dental Injury: Teeth and Oropharynx as per pre-operative assessment        

## 2020-02-04 NOTE — Discharge Instructions (Signed)
1)  The drugs that you have been given will stay in your system until tomorrow so for the       next 24 hours you should not:  A. Drive an automobile  B. Make any legal decisions  C. Drink any alcoholic beverages  2)  You may resume your regular meals upon return home.  3)  A responsible adult must take you home.  Someone should stay with you for a few          hours, then be available by phone for the remainder of the treatment day.  4)  You May experience any of the following symptoms:  Headache, Nausea and a dry mouth (due to the medications you were given),  temporary memory loss and some confusion, or sore muscles (a warm bath  should help this).  If you you experience any of these symptoms let us know on                your return visit.  5)  Report any of the following: any acute discomfort, severe headache, or temperature        greater than 100.5 F.   Also report any unusual redness, swelling, drainage, or pain         at your IV site.    You may report Symptoms to:  [place] at [facility]          Phone: [phone], ECT Department           or Dr. Prescott Gum office [phone]  6)  Your next ECT Treatment is Friday April 16   We will call 2 days prior to your scheduled appointment for arrival times.  7)  Nothing to eat or drink after midnight the night before your procedure.  8)  Take morning meds with a sip of water the morning of your procedure.  9)  Other Instructions: Call [phone] to cancel the morning of your procedure due         to illness or emergency.  10) We will call within 72 hours to assess how you are feeling.

## 2020-02-04 NOTE — Procedures (Signed)
ECT SERVICES Physician's Interval Evaluation & Treatment Note  Patient Identification: Melinda [name] MRN:  [id number] Date of Evaluation:  02/04/2020 TX #: [id number]  MADRS:   MMSE:   P.E. Findings:  No change to physical exam  Psychiatric Interval Note:  Mood is stable depression no worse no suicidal ideation  Subjective:  Patient is a 49 y.o. female seen for evaluation for Electroconvulsive Therapy. Usual complaints about her son but nothing worse than that  Treatment Summary:   []   Right Unilateral             [x]  Bilateral   % Energy : 1.0 ms 35%   Impedance: 1110 ohms  Seizure Energy Index: 4930 V squared  Postictal Suppression Index: 75%  Seizure Concordance Index: 90%  Medications  Pre Shock: Robinul 0.4 mg Toradol 30 mg esmolol 20 mg labetalol 20 mg Brevital 70 mg succinylcholine 100 mg  Post Shock:    Seizure Duration: 33 seconds EMG 74 seconds EEG   Comments: Follow-up in 3 weeks due to scheduling conflicts.  Lungs:  [x]   Clear to auscultation               []  Other:   Heart:    [x]   Regular rhythm             []  irregular rhythm    [x]   Previous H&P reviewed, patient examined and there are NO CHANGES                 []   Previous H&P reviewed, patient examined and there are changes noted.   [name], MD 3/26/20213:12 PM

## 2020-02-04 NOTE — Anesthesia Preprocedure Evaluation (Signed)
Anesthesia Evaluation  Patient identified by MRN, date of birth, ID band Patient awake    Reviewed: Allergy & Precautions, H&P , NPO status , Patient's Chart, lab work & pertinent test results, reviewed documented beta blocker date and time   Airway Mallampati: II   Neck ROM: full    Dental  (+) Poor Dentition   Pulmonary sleep apnea and Continuous Positive Airway Pressure Ventilation ,    Pulmonary exam normal        Cardiovascular Exercise Tolerance: Poor hypertension, On Medications negative cardio ROS Normal cardiovascular exam Rhythm:regular Rate:Normal     Neuro/Psych PSYCHIATRIC DISORDERS Depression Bipolar Disorder  Neuromuscular disease    GI/Hepatic Neg liver ROS, GERD  Medicated,  Endo/Other  negative endocrine ROSdiabetes  Renal/GU negative Renal ROS  negative genitourinary   Musculoskeletal   Abdominal   Peds  Hematology negative hematology ROS (+)   Anesthesia Other Findings Past Medical History: No date: Depression No date: Diabetes mellitus without complication ([place]) 99991111: Diabetic peripheral neuropathy ([place]) 03/07/15: Diabetic peripheral neuropathy ([place]) 03/07/15: Diabetic peripheral neuropathy (HCC) No date: GERD (gastroesophageal reflux disease) 03/07/15: Hypercholesterolemia No date: Hypertension 03/07/15: Obesity 03/07/15: Personality disorder ([place]) 03/07/15: Sinus tachycardia     Comment:  history of 03/07/15: Suicidal thoughts Past Surgical History: 12/01/2018: COLONOSCOPY WITH PROPOFOL; N/A     Comment:  Procedure: COLONOSCOPY WITH PROPOFOL;  Surgeon: Jonathon Bellows, MD;  [facility] ENDOSCOPY;  Service:               Gastroenterology;  Laterality: N/A; 03/07/15: electroconvulsion therapy BMI    Body Mass Index: 38.79 kg/m     Reproductive/Obstetrics negative OB ROS                             Anesthesia Physical Anesthesia Plan  ASA:  III  Anesthesia Plan: General   Post-op Pain Management:    Induction:   PONV Risk Score and Plan:   Airway Management Planned:   Additional Equipment:   Intra-op Plan:   Post-operative Plan:   Informed Consent: I have reviewed the patients History and Physical, chart, labs and discussed the procedure including the risks, benefits and alternatives for the proposed anesthesia with the patient or authorized representative who has indicated his/her understanding and acceptance.     Dental Advisory Given  Plan Discussed with: CRNA  Anesthesia Plan Comments:         Anesthesia Quick Evaluation

## 2020-02-04 NOTE — Anesthesia Postprocedure Evaluation (Signed)
Anesthesia Post Note  Patient: Melinda [name]  Procedure(s) Performed: ECT TX  Patient location during evaluation: PACU Anesthesia Type: General Level of consciousness: awake and alert Pain management: pain level controlled Vital Signs Assessment: post-procedure vital signs reviewed and stable Respiratory status: spontaneous breathing, nonlabored ventilation, respiratory function stable and patient connected to nasal cannula oxygen Cardiovascular status: blood pressure returned to baseline and stable Postop Assessment: no apparent nausea or vomiting Anesthetic complications: no     Last Vitals:  Vitals:   02/04/20 1143 02/04/20 1150  BP: 126/68 122/72  Pulse: 86 84  Resp: 20 18  Temp: 37 C   SpO2: 97%     Last Pain:  Vitals:   02/04/20 1150  TempSrc:   PainSc: 0-No pain                 Molli Barrows

## 2020-02-04 NOTE — Transfer of Care (Signed)
Immediate Anesthesia Transfer of Care Note  Patient: Melinda [name]  Procedure(s) Performed: ECT TX  Patient Location: PACU  Anesthesia Type:General  Level of Consciousness: sedated  Airway & Oxygen Therapy: Patient Spontanous Breathing and Patient connected to face mask oxygen  Post-op Assessment: Report given to RN and Post -op Vital signs reviewed and stable  Post vital signs: Reviewed and stable  Last Vitals:  Vitals Value Taken Time  BP    Temp    Pulse    Resp    SpO2      Last Pain:  Vitals:   02/04/20 0810  TempSrc: Oral  PainSc: 0-No pain         Complications: No apparent anesthesia complications

## 2020-02-08 ENCOUNTER — Other Ambulatory Visit: Payer: Self-pay | Admitting: Physician Assistant

## 2020-02-08 DIAGNOSIS — E1142 Type 2 diabetes mellitus with diabetic polyneuropathy: Secondary | ICD-10-CM

## 2020-02-08 NOTE — Telephone Encounter (Signed)
Requested Prescriptions  Pending Prescriptions Disp Refills  . glucose blood (ACCU-CHEK AVIVA PLUS) test strip [Pharmacy Med Name: ACCU-CHEK AVIVA PLUS   Strip] 100 strip 12    Sig: [place] MORNING     Endocrinology: Diabetes - Testing Supplies Passed - 02/08/2020 11:06 PM      Passed - Valid encounter within last 12 months    Recent Outpatient Visits          3 weeks ago Controlled type 2 diabetes mellitus without complication, without long-term current use of insulin [facility])   [place], [place], PA-C   4 months ago Type 2 diabetes mellitus with diabetic polyneuropathy, without long-term current use of insulin [facility])   [facility] [place], Fabio Bering M, PA-C   9 months ago Type 2 diabetes mellitus with diabetic polyneuropathy, without long-term current use of insulin [facility])   [facility] Carles Collet M, [place]   11 months ago Right arm pain   [place], [place]   12 months ago Diabetes mellitus without complication [facility])   [facility] Trinna Post, [place]      Future Appointments            In 3 months [name], Wendee [place], PA-C [employer], [facility]

## 2020-02-11 ENCOUNTER — Telehealth: Payer: Self-pay

## 2020-02-15 ENCOUNTER — Other Ambulatory Visit: Payer: Self-pay | Admitting: Physician Assistant

## 2020-02-15 NOTE — Telephone Encounter (Signed)
Requested Prescriptions  Pending Prescriptions Disp Refills  . Alcohol Swabs (B-D SINGLE USE SWABS REGULAR) PADS [Pharmacy Med Name: BD SWABS SINGLE USE   Pad] 300 each 0    Sig: USE AS DIRECTED     Off-Protocol Failed - 02/15/2020 10:40 PM      Failed - Medication not assigned to a protocol, review manually.      Passed - Valid encounter within last 12 months    Recent Outpatient Visits          1 month ago Controlled type 2 diabetes mellitus without complication, without long-term current use of insulin [facility])   [place], [place], PA-C   5 months ago Type 2 diabetes mellitus with diabetic polyneuropathy, without long-term current use of insulin [facility])   [facility] [place], Fabio Bering M, [place]   9 months ago Type 2 diabetes mellitus with diabetic polyneuropathy, without long-term current use of insulin [facility])   [facility] Carles Collet M, [place]   11 months ago Right arm pain   [facility] Carles Collet M, [place]   1 year ago Diabetes mellitus without complication [facility])   [facility] Trinna Post, [place]      Future Appointments            In 3 months Trinna Post, PA-C [employer], [facility]

## 2020-02-21 ENCOUNTER — Other Ambulatory Visit: Payer: Self-pay

## 2020-02-21 ENCOUNTER — Other Ambulatory Visit
Admission: RE | Admit: 2020-02-21 | Discharge: 2020-02-21 | Disposition: A | Discharge status: Home / Self Care | Payer: Medicare PPO | Source: Ambulatory Visit | Attending: Psychiatry | Admitting: Psychiatry

## 2020-02-21 DIAGNOSIS — Z20822 Contact with and (suspected) exposure to covid-19: Secondary | ICD-10-CM | POA: Insufficient documentation

## 2020-02-21 DIAGNOSIS — Z01812 Encounter for preprocedural laboratory examination: Secondary | ICD-10-CM | POA: Insufficient documentation

## 2020-02-21 DIAGNOSIS — E119 Type 2 diabetes mellitus without complications: Secondary | ICD-10-CM | POA: Diagnosis not present

## 2020-02-21 LAB — HM DIABETES EYE EXAM

## 2020-02-21 LAB — SARS CORONAVIRUS 2 (TAT 6-24 HRS): SARS Coronavirus 2: NEGATIVE

## 2020-02-23 ENCOUNTER — Telehealth: Payer: Self-pay

## 2020-02-24 ENCOUNTER — Other Ambulatory Visit: Payer: Self-pay | Admitting: Psychiatry

## 2020-02-25 ENCOUNTER — Encounter: Payer: Self-pay | Admitting: Anesthesiology

## 2020-02-25 ENCOUNTER — Encounter
Admission: RE | Admit: 2020-02-25 | Discharge: 2020-02-25 | Disposition: A | Discharge status: Home / Self Care | Payer: Medicare PPO | Source: Ambulatory Visit | Attending: Psychiatry | Admitting: Psychiatry

## 2020-02-25 ENCOUNTER — Other Ambulatory Visit: Payer: Self-pay

## 2020-02-25 DIAGNOSIS — E1142 Type 2 diabetes mellitus with diabetic polyneuropathy: Secondary | ICD-10-CM | POA: Insufficient documentation

## 2020-02-25 DIAGNOSIS — E7800 Pure hypercholesterolemia, unspecified: Secondary | ICD-10-CM | POA: Diagnosis not present

## 2020-02-25 DIAGNOSIS — Z20828 Contact with and (suspected) exposure to other viral communicable diseases: Secondary | ICD-10-CM | POA: Insufficient documentation

## 2020-02-25 DIAGNOSIS — F332 Major depressive disorder, recurrent severe without psychotic features: Secondary | ICD-10-CM

## 2020-02-25 DIAGNOSIS — F329 Major depressive disorder, single episode, unspecified: Secondary | ICD-10-CM | POA: Insufficient documentation

## 2020-02-25 DIAGNOSIS — G4730 Sleep apnea, unspecified: Secondary | ICD-10-CM | POA: Diagnosis not present

## 2020-02-25 DIAGNOSIS — E1140 Type 2 diabetes mellitus with diabetic neuropathy, unspecified: Secondary | ICD-10-CM | POA: Diagnosis not present

## 2020-02-25 DIAGNOSIS — I10 Essential (primary) hypertension: Secondary | ICD-10-CM | POA: Insufficient documentation

## 2020-02-25 DIAGNOSIS — K219 Gastro-esophageal reflux disease without esophagitis: Secondary | ICD-10-CM | POA: Diagnosis not present

## 2020-02-25 DIAGNOSIS — E669 Obesity, unspecified: Secondary | ICD-10-CM | POA: Insufficient documentation

## 2020-02-25 LAB — GLUCOSE, CAPILLARY: Glucose-Capillary: 86 mg/dL (ref 70–99)

## 2020-02-25 MED ORDER — GLYCOPYRROLATE 0.2 MG/ML IJ SOLN: 0.4000 mg | Freq: Once | INTRAMUSCULAR | Status: DC

## 2020-02-25 MED ORDER — SUCCINYLCHOLINE CHLORIDE 200 MG/10ML IV SOSY
PREFILLED_SYRINGE | INTRAVENOUS | Status: AC
  Filled 2020-02-25: qty 10

## 2020-02-25 MED ORDER — LABETALOL HCL 5 MG/ML IV SOLN
INTRAVENOUS | Status: DC | PRN
  Administered 2020-02-25: 30 mg via INTRAVENOUS

## 2020-02-25 MED ORDER — ESMOLOL HCL 100 MG/10ML IV SOLN
INTRAVENOUS | Status: DC | PRN
  Administered 2020-02-25: 20 mg via INTRAVENOUS

## 2020-02-25 MED ORDER — METHOHEXITAL SODIUM 100 MG/10ML IV SOSY
PREFILLED_SYRINGE | INTRAVENOUS | Status: DC | PRN
  Administered 2020-02-25: 70 mg via INTRAVENOUS

## 2020-02-25 MED ORDER — SUCCINYLCHOLINE CHLORIDE 20 MG/ML IJ SOLN
INTRAMUSCULAR | Status: DC | PRN
  Administered 2020-02-25: 100 mg via INTRAVENOUS

## 2020-02-25 MED ORDER — FENTANYL CITRATE (PF) 100 MCG/2ML IJ SOLN: 25.0000 ug | INTRAMUSCULAR | Status: DC | PRN

## 2020-02-25 MED ORDER — GLYCOPYRROLATE 0.2 MG/ML IJ SOLN
INTRAMUSCULAR | Status: AC
  Filled 2020-02-25: qty 2

## 2020-02-25 MED ORDER — SODIUM CHLORIDE 0.9 % IV SOLN: 500.0000 mL | Freq: Once | INTRAVENOUS | Status: AC

## 2020-02-25 MED ORDER — ESMOLOL HCL 100 MG/10ML IV SOLN
INTRAVENOUS | Status: AC
  Filled 2020-02-25: qty 10

## 2020-02-25 MED ORDER — LABETALOL HCL 5 MG/ML IV SOLN
INTRAVENOUS | Status: AC
  Filled 2020-02-25: qty 8

## 2020-02-25 MED ORDER — METHOHEXITAL SODIUM 0.5 G IJ SOLR
INTRAMUSCULAR | Status: AC
  Filled 2020-02-25: qty 500

## 2020-02-25 MED ORDER — ONDANSETRON HCL 4 MG/2ML IJ SOLN: 4.0000 mg | Freq: Once | INTRAMUSCULAR | Status: DC | PRN

## 2020-02-25 MED ORDER — KETOROLAC TROMETHAMINE 30 MG/ML IJ SOLN: 30.0000 mg | Freq: Once | INTRAMUSCULAR | Status: DC

## 2020-02-25 MED ORDER — KETOROLAC TROMETHAMINE 30 MG/ML IJ SOLN
INTRAMUSCULAR | Status: AC
  Filled 2020-02-25: qty 1

## 2020-02-25 NOTE — Transfer of Care (Signed)
Immediate Anesthesia Transfer of Care Note  Patient: Melinda [name]  Procedure(s) Performed: ECT TX  Patient Location: PACU  Anesthesia Type:General  Level of Consciousness: drowsy and patient cooperative  Airway & Oxygen Therapy: Patient Spontanous Breathing  Post-op Assessment: Report given to RN and Post -op Vital signs reviewed and stable  Post vital signs: Reviewed and stable  Last Vitals:  Vitals Value Taken Time  BP 137/93 02/25/20 1008  Temp 36.8 C 02/25/20 1008  Pulse 104 02/25/20 1010  Resp 21 02/25/20 1010  SpO2 94 % 02/25/20 1010  Vitals shown include unvalidated device data.  Last Pain:  Vitals:   02/25/20 0855  TempSrc:   PainSc: 0-No pain         Complications: No apparent anesthesia complications

## 2020-02-25 NOTE — Procedures (Signed)
ECT SERVICES Physician's Interval Evaluation & Treatment Note  Patient Identification: Melinda [name] MRN:  [id number] Date of Evaluation:  02/25/2020 TX #: [id number]  MADRS:   MMSE:   P.E. Findings:  No change to physical exam  Psychiatric Interval Note:  Stable no new complaints  Subjective:  Patient is a 49 y.o. female seen for evaluation for Electroconvulsive Therapy. No specific concern  Treatment Summary:   []   Right Unilateral             [x]  Bilateral   % Energy : 1.0 ms 35%   Impedance: 990 ohms  Seizure Energy Index: 3089 V squared  Postictal Suppression Index: 78%  Seizure Concordance Index: 91%  Medications  Pre Shock: Robinul 0.2 mg Toradol 30 mg labetalol 20 mg esmolol 20 mg Brevital 70 mg succinylcholine 100 mg  Post Shock:    Seizure Duration: 29 seconds EMG 69 seconds EEG   Comments: Follow-up 2 weeks  Lungs:  [x]   Clear to auscultation               []  Other:   Heart:    [x]   Regular rhythm             []  irregular rhythm    [x]   Previous H&P reviewed, patient examined and there are NO CHANGES                 []   Previous H&P reviewed, patient examined and there are changes noted.   [name], MD 4/16/20214:49 PM

## 2020-02-25 NOTE — H&P (Signed)
Melinda [name] is an 49 y.o. female.   Chief Complaint: mood chronic depression . Not suicidal HPI: recurrent depression with maintenance ect  Past Medical History:  Diagnosis Date  . Depression   . Diabetes mellitus without complication ([place])   . Diabetic peripheral neuropathy ([place]) 03/07/15  . Diabetic peripheral neuropathy ([place]) 03/07/15  . Diabetic peripheral neuropathy ([place]) 03/07/15  . GERD (gastroesophageal reflux disease)   . Hypercholesterolemia 03/07/15  . Hypertension   . Obesity 03/07/15  . Personality disorder ([place]) 03/07/15  . Sinus tachycardia 03/07/15   history of  . Suicidal thoughts 03/07/15    Past Surgical History:  Procedure Laterality Date  . COLONOSCOPY WITH PROPOFOL N/A 12/01/2018   Procedure: COLONOSCOPY WITH PROPOFOL;  Surgeon: Jonathon Bellows, MD;  Location: [facility] ENDOSCOPY;  Service: Gastroenterology;  Laterality: N/A;  . electroconvulsion therapy  03/07/15    Family History  Problem Relation Age of Onset  . Hypertension Father   . Diabetes Mother    Social History:  reports that she has never smoked. She has never used smokeless tobacco. She reports that she does not drink alcohol or use drugs.  Allergies:  Allergies  Allergen Reactions  . Prednisone     Increases blood sugar    (Not in a hospital admission)   Results for orders placed or performed during the hospital encounter of 02/25/20 (from the past 48 hour(s))  Glucose, capillary     Status: None   Collection Time: 02/25/20  9:17 AM  Result Value Ref Range   Glucose-Capillary 86 70 - 99 mg/dL    Comment: Glucose reference range applies only to samples taken after fasting for at least 8 hours.   No results found.  Review of Systems  Constitutional: Negative.   HENT: Negative.   Eyes: Negative.   Respiratory: Negative.   Cardiovascular: Negative.   Gastrointestinal: Negative.   Musculoskeletal: Negative.   Skin: Negative.   Neurological: Negative.   Psychiatric/Behavioral: Negative.      Blood pressure (!) 148/91, pulse 88, temperature (!) 97.4 F (36.3 C), temperature source Oral, resp. rate 18, height 5\' 4"  (1.626 m), weight 104.3 kg, SpO2 100 %. Physical Exam  Nursing note and vitals reviewed. Constitutional: She appears well-developed and well-nourished.  HENT:  Head: Normocephalic and atraumatic.  Eyes: Pupils are equal, round, and reactive to light. Conjunctivae are normal.  Cardiovascular: Regular rhythm and normal heart sounds.  Respiratory: Effort normal.  GI: Soft.  Musculoskeletal:        General: Normal range of motion.     Cervical back: Normal range of motion.  Neurological: She is alert.  Skin: Skin is warm and dry.  Psychiatric: She has a normal mood and affect. Her behavior is normal. Judgment and thought content normal.     Assessment/Plan Follow up 2 weeks  [name], MD 02/25/2020, 9:48 AM

## 2020-02-25 NOTE — Anesthesia Preprocedure Evaluation (Signed)
Anesthesia Evaluation  Patient identified by MRN, date of birth, ID band Patient awake    Reviewed: Allergy & Precautions, NPO status , Patient's Chart, lab work & pertinent test results  History of Anesthesia Complications Negative for: history of anesthetic complications  Airway Mallampati: III       Dental   Pulmonary sleep apnea and Continuous Positive Airway Pressure Ventilation , neg COPD, Patient abstained from smoking.Not current smoker,           Cardiovascular Exercise Tolerance: Good METShypertension, Pt. on medications (-) Past MI (-) dysrhythmias  - Systolic murmurs    Neuro/Psych PSYCHIATRIC DISORDERS Depression Bipolar Disorder negative neurological ROS     GI/Hepatic Neg liver ROS, GERD  Controlled and Medicated,(+)     (-) substance abuse  ,   Endo/Other  diabetes, Type 2, Oral Hypoglycemic Agents  Renal/GU negative Renal ROS     Musculoskeletal   Abdominal   Peds  Hematology   Anesthesia Other Findings   Reproductive/Obstetrics                             Anesthesia Physical  Anesthesia Plan  ASA: III  Anesthesia Plan: General   Post-op Pain Management:    Induction: Intravenous  PONV Risk Score and Plan: 2 and Ondansetron and TIVA  Airway Management Planned: Mask  Additional Equipment: None  Intra-op Plan:   Post-operative Plan:   Informed Consent: I have reviewed the patients History and Physical, chart, labs and discussed the procedure including the risks, benefits and alternatives for the proposed anesthesia with the patient or authorized representative who has indicated his/her understanding and acceptance.       Plan Discussed with:   Anesthesia Plan Comments:         Anesthesia Quick Evaluation

## 2020-02-25 NOTE — Discharge Instructions (Signed)
1)  The drugs that you have been given will stay in your system until tomorrow so for the       next 24 hours you should not:  A. Drive an automobile  B. Make any legal decisions  C. Drink any alcoholic beverages  2)  You may resume your regular meals upon return home.  3)  A responsible adult must take you home.  Someone should stay with you for a few          hours, then be available by phone for the remainder of the treatment day.  4)  You May experience any of the following symptoms:  Headache, Nausea and a dry mouth (due to the medications you were given),  temporary memory loss and some confusion, or sore muscles (a warm bath  should help this).  If you you experience any of these symptoms let us know on                your return visit.  5)  Report any of the following: any acute discomfort, severe headache, or temperature        greater than 100.5 F.   Also report any unusual redness, swelling, drainage, or pain         at your IV site.    You may report Symptoms to:  [place] [facility]          Phone: [phone], ECT Department           or Dr. Prescott Gum office [phone]  6)  Your next ECT Treatment is Friday April 30 at 8:30.  We will call 2 days prior to your scheduled appointment for arrival times.  7)  Nothing to eat or drink after midnight the night before your procedure.  8)  Take lisinopril with a sip of water the morning of your procedure.  9)  Other Instructions: Call [phone] to cancel the morning of your procedure due         to illness or emergency.  10) We will call within 72 hours to assess how you are feeling.

## 2020-02-25 NOTE — Anesthesia Postprocedure Evaluation (Signed)
Anesthesia Post Note  Patient: Melinda [name]  Procedure(s) Performed: ECT TX  Patient location during evaluation: PACU Anesthesia Type: General Level of consciousness: awake and alert Pain management: pain level controlled Vital Signs Assessment: post-procedure vital signs reviewed and stable Respiratory status: spontaneous breathing and respiratory function stable Cardiovascular status: stable Anesthetic complications: no     Last Vitals:  Vitals:   02/25/20 1008 02/25/20 1020  BP: (!) 137/93 (!) 129/93  Pulse: (!) 106 (!) 101  Resp: 16 (!) 25  Temp: 36.8 C   SpO2: 95% 94%    Last Pain:  Vitals:   02/25/20 0855  TempSrc:   PainSc: 0-No pain                 [name]

## 2020-03-08 ENCOUNTER — Telehealth: Payer: Self-pay

## 2020-03-08 ENCOUNTER — Other Ambulatory Visit
Admission: RE | Admit: 2020-03-08 | Discharge: 2020-03-08 | Disposition: A | Discharge status: Home / Self Care | Payer: Medicare PPO | Source: Ambulatory Visit | Attending: Psychiatry | Admitting: Psychiatry

## 2020-03-08 DIAGNOSIS — Z01812 Encounter for preprocedural laboratory examination: Secondary | ICD-10-CM | POA: Diagnosis not present

## 2020-03-08 DIAGNOSIS — Z20822 Contact with and (suspected) exposure to covid-19: Secondary | ICD-10-CM | POA: Insufficient documentation

## 2020-03-08 LAB — SARS CORONAVIRUS 2 (TAT 6-24 HRS): SARS Coronavirus 2: NEGATIVE

## 2020-03-09 ENCOUNTER — Telehealth: Payer: Self-pay | Admitting: Physician Assistant

## 2020-03-09 NOTE — Telephone Encounter (Signed)
Left message for patient to schedule Annual Wellness Visit.  Please schedule with Nurse Health Advisor Victoria Britt, RN at [place]  

## 2020-03-09 NOTE — Telephone Encounter (Signed)
Melinda [name], We are all a little confused about this, why does the patient need to go to [facility] when she is a patient at this office? Thanks for your help [place]

## 2020-03-09 NOTE — Telephone Encounter (Signed)
Pt is confused about this message that was left for her.  It says to schedule an appt at [facility] in [place] for a nurse visit .  She has not ever been to that office.  Pt's CB#  [phone]

## 2020-03-10 ENCOUNTER — Encounter: Payer: Self-pay | Admitting: Registered Nurse

## 2020-03-10 ENCOUNTER — Other Ambulatory Visit: Payer: Self-pay

## 2020-03-10 ENCOUNTER — Encounter (HOSPITAL_BASED_OUTPATIENT_CLINIC_OR_DEPARTMENT_OTHER)
Admission: RE | Admit: 2020-03-10 | Discharge: 2020-03-10 | Disposition: A | Discharge status: Home / Self Care | Payer: Medicare PPO | Source: Ambulatory Visit | Attending: Psychiatry | Admitting: Psychiatry

## 2020-03-10 DIAGNOSIS — Z6836 Body mass index (BMI) 36.0-36.9, adult: Secondary | ICD-10-CM | POA: Diagnosis not present

## 2020-03-10 DIAGNOSIS — K219 Gastro-esophageal reflux disease without esophagitis: Secondary | ICD-10-CM | POA: Diagnosis not present

## 2020-03-10 DIAGNOSIS — E1142 Type 2 diabetes mellitus with diabetic polyneuropathy: Secondary | ICD-10-CM | POA: Diagnosis not present

## 2020-03-10 DIAGNOSIS — F332 Major depressive disorder, recurrent severe without psychotic features: Secondary | ICD-10-CM

## 2020-03-10 DIAGNOSIS — F329 Major depressive disorder, single episode, unspecified: Secondary | ICD-10-CM | POA: Diagnosis not present

## 2020-03-10 DIAGNOSIS — F319 Bipolar disorder, unspecified: Secondary | ICD-10-CM | POA: Diagnosis not present

## 2020-03-10 DIAGNOSIS — I10 Essential (primary) hypertension: Secondary | ICD-10-CM | POA: Diagnosis not present

## 2020-03-10 DIAGNOSIS — E119 Type 2 diabetes mellitus without complications: Secondary | ICD-10-CM | POA: Diagnosis not present

## 2020-03-10 DIAGNOSIS — Z20828 Contact with and (suspected) exposure to other viral communicable diseases: Secondary | ICD-10-CM | POA: Diagnosis not present

## 2020-03-10 DIAGNOSIS — E7800 Pure hypercholesterolemia, unspecified: Secondary | ICD-10-CM | POA: Diagnosis not present

## 2020-03-10 DIAGNOSIS — E669 Obesity, unspecified: Secondary | ICD-10-CM | POA: Diagnosis not present

## 2020-03-10 LAB — GLUCOSE, CAPILLARY
Glucose-Capillary: 93 mg/dL (ref 70–99)
Glucose-Capillary: 118 mg/dL — ABNORMAL HIGH (ref 70–99)

## 2020-03-10 MED ORDER — LABETALOL HCL 5 MG/ML IV SOLN
INTRAVENOUS | Status: AC
  Filled 2020-03-10: qty 4

## 2020-03-10 MED ORDER — ESMOLOL HCL 100 MG/10ML IV SOLN
INTRAVENOUS | Status: AC
  Filled 2020-03-10: qty 10

## 2020-03-10 MED ORDER — GLYCOPYRROLATE 0.2 MG/ML IJ SOLN
INTRAMUSCULAR | Status: AC
  Filled 2020-03-10: qty 1

## 2020-03-10 MED ORDER — LABETALOL HCL 5 MG/ML IV SOLN
INTRAVENOUS | Status: DC | PRN
  Administered 2020-03-10: 20 mg via INTRAVENOUS
  Administered 2020-03-10: 10 mg via INTRAVENOUS

## 2020-03-10 MED ORDER — KETOROLAC TROMETHAMINE 30 MG/ML IJ SOLN: 30.0000 mg | Freq: Once | INTRAMUSCULAR | Status: AC

## 2020-03-10 MED ORDER — GLYCOPYRROLATE 0.2 MG/ML IJ SOLN
INTRAMUSCULAR | Status: AC
  Administered 2020-03-10: 0.4 mg via INTRAVENOUS
  Filled 2020-03-10: qty 1

## 2020-03-10 MED ORDER — SUCCINYLCHOLINE CHLORIDE 20 MG/ML IJ SOLN
INTRAMUSCULAR | Status: DC | PRN
  Administered 2020-03-10: 100 mg via INTRAVENOUS

## 2020-03-10 MED ORDER — METHOHEXITAL SODIUM 0.5 G IJ SOLR
INTRAMUSCULAR | Status: AC
  Filled 2020-03-10: qty 500

## 2020-03-10 MED ORDER — KETOROLAC TROMETHAMINE 30 MG/ML IJ SOLN
INTRAMUSCULAR | Status: AC
  Administered 2020-03-10: 30 mg via INTRAVENOUS
  Filled 2020-03-10: qty 1

## 2020-03-10 MED ORDER — SODIUM CHLORIDE 0.9 % IV SOLN: INTRAVENOUS | Status: DC | PRN

## 2020-03-10 MED ORDER — GLYCOPYRROLATE 0.2 MG/ML IJ SOLN: 0.4000 mg | Freq: Once | INTRAMUSCULAR | Status: AC

## 2020-03-10 MED ORDER — SUCCINYLCHOLINE CHLORIDE 200 MG/10ML IV SOSY
PREFILLED_SYRINGE | INTRAVENOUS | Status: AC
  Filled 2020-03-10: qty 10

## 2020-03-10 MED ORDER — METHOHEXITAL SODIUM 100 MG/10ML IV SOSY
PREFILLED_SYRINGE | INTRAVENOUS | Status: DC | PRN
  Administered 2020-03-10: 70 mg via INTRAVENOUS

## 2020-03-10 MED ORDER — ESMOLOL HCL 100 MG/10ML IV SOLN
INTRAVENOUS | Status: DC | PRN
Start: 2020-03-10 — End: 2020-03-10
  Administered 2020-03-10: 30 mg via INTRAVENOUS

## 2020-03-10 MED ORDER — SODIUM CHLORIDE 0.9 % IV SOLN
500.0000 mL | Freq: Once | INTRAVENOUS | Status: AC
  Administered 2020-03-10: 500 mL via INTRAVENOUS

## 2020-03-10 NOTE — H&P (Signed)
Melinda [name] is an 49 y.o. female.   Chief Complaint: No specific new complaint. HPI: Chronic recurrent depression  Past Medical History:  Diagnosis Date  . Depression   . Diabetes mellitus without complication ([place])   . Diabetic peripheral neuropathy ([place]) 03/07/15  . Diabetic peripheral neuropathy ([place]) 03/07/15  . Diabetic peripheral neuropathy ([place]) 03/07/15  . GERD (gastroesophageal reflux disease)   . Hypercholesterolemia 03/07/15  . Hypertension   . Obesity 03/07/15  . Personality disorder ([place]) 03/07/15  . Sinus tachycardia 03/07/15   history of  . Suicidal thoughts 03/07/15    Past Surgical History:  Procedure Laterality Date  . COLONOSCOPY WITH PROPOFOL N/A 12/01/2018   Procedure: COLONOSCOPY WITH PROPOFOL;  Surgeon: Jonathon Bellows, MD;  Location: [facility] ENDOSCOPY;  Service: Gastroenterology;  Laterality: N/A;  . electroconvulsion therapy  03/07/15    Family History  Problem Relation Age of Onset  . Hypertension Father   . Diabetes Mother    Social History:  reports that she has never smoked. She has never used smokeless tobacco. She reports that she does not drink alcohol or use drugs.  Allergies:  Allergies  Allergen Reactions  . Prednisone     Increases blood sugar    (Not in a hospital admission)   Results for orders placed or performed during the hospital encounter of 03/10/20 (from the past 48 hour(s))  Glucose, capillary     Status: None   Collection Time: 03/10/20  9:47 AM  Result Value Ref Range   Glucose-Capillary 93 70 - 99 mg/dL    Comment: Glucose reference range applies only to samples taken after fasting for at least 8 hours.  Glucose, capillary     Status: Abnormal   Collection Time: 03/10/20 11:32 AM  Result Value Ref Range   Glucose-Capillary 118 (H) 70 - 99 mg/dL    Comment: Glucose reference range applies only to samples taken after fasting for at least 8 hours.   No results found.  Review of Systems  Constitutional: Negative.   HENT:  Negative.   Eyes: Negative.   Respiratory: Negative.   Cardiovascular: Negative.   Gastrointestinal: Negative.   Musculoskeletal: Negative.   Skin: Negative.   Neurological: Negative.   Psychiatric/Behavioral: Negative.     Blood pressure 125/75, pulse 98, temperature 98.2 F (36.8 C), temperature source Oral, resp. rate 20, height 5\' 4"  (1.626 m), weight 104.3 kg, SpO2 97 %. Physical Exam  Nursing note and vitals reviewed. Constitutional: She appears well-developed and well-nourished.  HENT:  Head: Normocephalic and atraumatic.  Eyes: Pupils are equal, round, and reactive to light. Conjunctivae are normal.  Cardiovascular: Regular rhythm and normal heart sounds.  Respiratory: Effort normal.  GI: Soft.  Musculoskeletal:        General: Normal range of motion.     Cervical back: Normal range of motion.  Neurological: She is alert.  Skin: Skin is warm and dry.  Psychiatric: She has a normal mood and affect. Her behavior is normal. Judgment and thought content normal.     Assessment/Plan ECT today as usual schedule we will see her back in 3 weeks because of a break in the scheduling.  [name], MD 03/10/2020, 6:36 PM

## 2020-03-10 NOTE — Procedures (Signed)
ECT SERVICES Physician's Interval Evaluation & Treatment Note  Patient Identification: Melinda [name] MRN:  [id number] Date of Evaluation:  03/10/2020 TX #: [id number]  MADRS:   MMSE:   P.E. Findings:  No change to physical exam  Psychiatric Interval Note:  Affect irritable anxious as usual  Subjective:  Patient is a 49 y.o. female seen for evaluation for Electroconvulsive Therapy. No new complaints  Treatment Summary:   []   Right Unilateral             [x]  Bilateral   % Energy : 1.0 ms 35%   Impedance: 1060 ohms  Seizure Energy Index: 5811 V squared  Postictal Suppression Index: 62%  Seizure Concordance Index: 95%  Medications  Pre Shock: Robinul 0.4 mg Toradol 30 mg labetalol 20 mg esmolol 20 mg Brevital 70 mg succinylcholine 100 mg  Post Shock:    Seizure Duration: 36 seconds by EMG, 88 seconds by EEG   Comments: Follow-up 3 weeks  Lungs:  [x]   Clear to auscultation               []  Other:   Heart:    [x]   Regular rhythm             []  irregular rhythm    [x]   Previous H&P reviewed, patient examined and there are NO CHANGES                 []   Previous H&P reviewed, patient examined and there are changes noted.   [name], MD 4/30/20216:37 PM

## 2020-03-10 NOTE — Anesthesia Postprocedure Evaluation (Signed)
Anesthesia Post Note  Patient: Melinda [name]  Procedure(s) Performed: ECT TX  Patient location during evaluation: PACU Anesthesia Type: General Level of consciousness: awake and alert Pain management: pain level controlled Vital Signs Assessment: post-procedure vital signs reviewed and stable Respiratory status: spontaneous breathing, nonlabored ventilation and respiratory function stable Cardiovascular status: blood pressure returned to baseline and stable Postop Assessment: no signs of nausea or vomiting Anesthetic complications: no     Last Vitals:  Vitals:   03/10/20 1130 03/10/20 1132  BP:  127/76  Pulse: 100 99  Resp: 17 (!) 23  Temp: 36.7 C 36.7 C  SpO2: 97% 97%    Last Pain:  Vitals:   03/10/20 1132  TempSrc:   PainSc: 0-No pain                 Arlon Bleier

## 2020-03-10 NOTE — Transfer of Care (Signed)
Immediate Anesthesia Transfer of Care Note  Patient: Melinda [name]  Procedure(s) Performed: ECT TX  Patient Location: PACU  Anesthesia Type:General  Level of Consciousness: drowsy  Airway & Oxygen Therapy: Patient Spontanous Breathing and Patient connected to face mask oxygen  Post-op Assessment: Report given to RN and Post -op Vital signs reviewed and stable  Post vital signs: Reviewed and stable  Last Vitals:  Vitals Value Taken Time  BP 117/72 03/10/20 1126  Temp 37.2 C 03/10/20 1119  Pulse 100 03/10/20 1126  Resp 16 03/10/20 1126  SpO2 100 % 03/10/20 1126  Vitals shown include unvalidated device data.  Last Pain:  Vitals:   03/10/20 1119  TempSrc:   PainSc: 0-No pain         Complications: No apparent anesthesia complications

## 2020-03-10 NOTE — Anesthesia Preprocedure Evaluation (Signed)
Anesthesia Evaluation  Patient identified by MRN, date of birth, ID band Patient awake    Reviewed: Allergy & Precautions, H&P , NPO status , Patient's Chart, lab work & pertinent test results  History of Anesthesia Complications Negative for: history of anesthetic complications  Airway Mallampati: II  TM Distance: >3 FB Neck ROM: full    Dental  (+) Poor Dentition, Chipped   Pulmonary sleep apnea , neg COPD,    Pulmonary exam normal breath sounds clear to auscultation       Cardiovascular hypertension, Pt. on medications (-) CAD and (-) Past MI negative cardio ROS Normal cardiovascular exam Rhythm:regular Rate:Normal     Neuro/Psych PSYCHIATRIC DISORDERS Depression Bipolar Disorder  Neuromuscular disease negative neurological ROS     GI/Hepatic negative GI ROS, Neg liver ROS, GERD  Medicated,  Endo/Other  diabetes, Type 2, Oral Hypoglycemic Agents  Renal/GU negative Renal ROS  negative genitourinary   Musculoskeletal   Abdominal (+) + obese,   Peds  Hematology negative hematology ROS (+)   Anesthesia Other Findings Past Medical History: No date: Depression No date: Diabetes mellitus without complication (HCC) 2/56/38: Diabetic peripheral neuropathy ([place]) 03/07/15: Diabetic peripheral neuropathy ([place]) 03/07/15: Diabetic peripheral neuropathy (HCC) No date: GERD (gastroesophageal reflux disease) 03/07/15: Hypercholesterolemia No date: Hypertension 03/07/15: Obesity 03/07/15: Personality disorder 03/07/15: Sinus tachycardia ([place])     Comment: history of 03/07/15: Suicidal thoughts Past Surgical History: 03/07/15: electroconvulsion therapy BMI    Body Mass Index:  36.44 kg/m     Reproductive/Obstetrics                             Anesthesia Physical  Anesthesia Plan  ASA: III  Anesthesia Plan: General   Post-op Pain Management:    Induction: Intravenous  PONV Risk Score and  Plan: 2 and Ondansetron  Airway Management Planned: Mask  Additional Equipment:   Intra-op Plan:   Post-operative Plan:   Informed Consent: I have reviewed the patients History and Physical, chart, labs and discussed the procedure including the risks, benefits and alternatives for the proposed anesthesia with the patient or authorized representative who has indicated his/her understanding and acceptance.     Dental Advisory Given  Plan Discussed with: CRNA and Anesthesiologist  Anesthesia Plan Comments:         Anesthesia Quick Evaluation

## 2020-03-10 NOTE — Discharge Instructions (Signed)
1)  The drugs that you have been given will stay in your system until tomorrow so for the       next 24 hours you should not:  A. Drive an automobile  B. Make any legal decisions  C. Drink any alcoholic beverages  2)  You may resume your regular meals upon return home.  3)  A responsible adult must take you home.  Someone should stay with you for a few          hours, then be available by phone for the remainder of the treatment day.  4)  You May experience any of the following symptoms:  Headache, Nausea and a dry mouth (due to the medications you were given),  temporary memory loss and some confusion, or sore muscles (a warm bath  should help this).  If you you experience any of these symptoms let us know on                your return visit.  5)  Report any of the following: any acute discomfort, severe headache, or temperature        greater than 100.5 F.   Also report any unusual redness, swelling, drainage, or pain         at your IV site.    You may report Symptoms to:  [place] at [facility]          Phone: [phone], ECT Department           or Dr. Prescott Gum office [phone]  6)  Your next ECT Treatment is Friday May 21 at 8:45  We will call 2 days prior to your scheduled appointment for arrival times.  7)  Nothing to eat or drink after midnight the night before your procedure.  8)  Take Lisinopril with a sip of water the morning of your procedure.  9)  Other Instructions: Call [phone] to cancel the morning of your procedure due         to illness or emergency.  10) We will call within 72 hours to assess how you are feeling.

## 2020-03-29 ENCOUNTER — Other Ambulatory Visit
Admission: RE | Admit: 2020-03-29 | Discharge: 2020-03-29 | Disposition: A | Discharge status: Home / Self Care | Payer: Medicare PPO | Source: Ambulatory Visit | Attending: Psychiatry | Admitting: Psychiatry

## 2020-03-29 ENCOUNTER — Other Ambulatory Visit: Payer: Self-pay

## 2020-03-29 ENCOUNTER — Telehealth: Payer: Self-pay

## 2020-03-29 DIAGNOSIS — Z01812 Encounter for preprocedural laboratory examination: Secondary | ICD-10-CM | POA: Insufficient documentation

## 2020-03-29 DIAGNOSIS — Z20822 Contact with and (suspected) exposure to covid-19: Secondary | ICD-10-CM | POA: Diagnosis not present

## 2020-03-30 ENCOUNTER — Other Ambulatory Visit: Payer: Self-pay | Admitting: Physician Assistant

## 2020-03-30 ENCOUNTER — Other Ambulatory Visit: Payer: Self-pay | Admitting: Psychiatry

## 2020-03-30 DIAGNOSIS — E119 Type 2 diabetes mellitus without complications: Secondary | ICD-10-CM

## 2020-03-30 LAB — SARS CORONAVIRUS 2 (TAT 6-24 HRS): SARS Coronavirus 2: NEGATIVE

## 2020-03-31 ENCOUNTER — Encounter
Admission: RE | Admit: 2020-03-31 | Discharge: 2020-03-31 | Disposition: A | Discharge status: Home / Self Care | Payer: Medicare PPO | Source: Ambulatory Visit | Attending: Psychiatry | Admitting: Psychiatry

## 2020-03-31 ENCOUNTER — Other Ambulatory Visit: Payer: Self-pay

## 2020-03-31 ENCOUNTER — Encounter: Payer: Self-pay | Admitting: Certified Registered"

## 2020-03-31 DIAGNOSIS — Z8249 Family history of ischemic heart disease and other diseases of the circulatory system: Secondary | ICD-10-CM | POA: Insufficient documentation

## 2020-03-31 DIAGNOSIS — F329 Major depressive disorder, single episode, unspecified: Secondary | ICD-10-CM | POA: Insufficient documentation

## 2020-03-31 DIAGNOSIS — E669 Obesity, unspecified: Secondary | ICD-10-CM | POA: Diagnosis not present

## 2020-03-31 DIAGNOSIS — Z79899 Other long term (current) drug therapy: Secondary | ICD-10-CM | POA: Insufficient documentation

## 2020-03-31 DIAGNOSIS — E1142 Type 2 diabetes mellitus with diabetic polyneuropathy: Secondary | ICD-10-CM | POA: Diagnosis not present

## 2020-03-31 DIAGNOSIS — K219 Gastro-esophageal reflux disease without esophagitis: Secondary | ICD-10-CM | POA: Diagnosis not present

## 2020-03-31 DIAGNOSIS — Z6839 Body mass index (BMI) 39.0-39.9, adult: Secondary | ICD-10-CM | POA: Diagnosis not present

## 2020-03-31 DIAGNOSIS — I10 Essential (primary) hypertension: Secondary | ICD-10-CM | POA: Insufficient documentation

## 2020-03-31 DIAGNOSIS — F319 Bipolar disorder, unspecified: Secondary | ICD-10-CM | POA: Diagnosis not present

## 2020-03-31 DIAGNOSIS — F609 Personality disorder, unspecified: Secondary | ICD-10-CM | POA: Diagnosis not present

## 2020-03-31 DIAGNOSIS — E1140 Type 2 diabetes mellitus with diabetic neuropathy, unspecified: Secondary | ICD-10-CM | POA: Diagnosis not present

## 2020-03-31 DIAGNOSIS — Z888 Allergy status to other drugs, medicaments and biological substances status: Secondary | ICD-10-CM | POA: Insufficient documentation

## 2020-03-31 DIAGNOSIS — F332 Major depressive disorder, recurrent severe without psychotic features: Secondary | ICD-10-CM

## 2020-03-31 DIAGNOSIS — E7800 Pure hypercholesterolemia, unspecified: Secondary | ICD-10-CM | POA: Diagnosis not present

## 2020-03-31 DIAGNOSIS — Z833 Family history of diabetes mellitus: Secondary | ICD-10-CM | POA: Insufficient documentation

## 2020-03-31 DIAGNOSIS — Z7984 Long term (current) use of oral hypoglycemic drugs: Secondary | ICD-10-CM | POA: Insufficient documentation

## 2020-03-31 LAB — GLUCOSE, CAPILLARY: Glucose-Capillary: 100 mg/dL — ABNORMAL HIGH (ref 70–99)

## 2020-03-31 MED ORDER — GLYCOPYRROLATE 0.2 MG/ML IJ SOLN
INTRAMUSCULAR | Status: AC
  Filled 2020-03-31: qty 2

## 2020-03-31 MED ORDER — FENTANYL CITRATE (PF) 100 MCG/2ML IJ SOLN: 25.0000 ug | INTRAMUSCULAR | Status: DC | PRN

## 2020-03-31 MED ORDER — KETOROLAC TROMETHAMINE 30 MG/ML IJ SOLN
INTRAMUSCULAR | Status: AC
  Filled 2020-03-31: qty 1

## 2020-03-31 MED ORDER — SUCCINYLCHOLINE CHLORIDE 200 MG/10ML IV SOSY
PREFILLED_SYRINGE | INTRAVENOUS | Status: DC | PRN
  Administered 2020-03-31: 100 mg via INTRAVENOUS

## 2020-03-31 MED ORDER — LABETALOL HCL 5 MG/ML IV SOLN
INTRAVENOUS | Status: AC
  Filled 2020-03-31: qty 8

## 2020-03-31 MED ORDER — ONDANSETRON HCL 4 MG/2ML IJ SOLN: 4.0000 mg | Freq: Once | INTRAMUSCULAR | Status: DC | PRN

## 2020-03-31 MED ORDER — LABETALOL HCL 5 MG/ML IV SOLN
INTRAVENOUS | Status: DC | PRN
  Administered 2020-03-31: 30 mg via INTRAVENOUS

## 2020-03-31 MED ORDER — METHOHEXITAL SODIUM 0.5 G IJ SOLR
INTRAMUSCULAR | Status: AC
  Filled 2020-03-31: qty 500

## 2020-03-31 MED ORDER — METHOHEXITAL SODIUM 100 MG/10ML IV SOSY
PREFILLED_SYRINGE | INTRAVENOUS | Status: DC | PRN
  Administered 2020-03-31: 70 mg via INTRAVENOUS

## 2020-03-31 MED ORDER — SODIUM CHLORIDE 0.9 % IV SOLN
500.0000 mL | Freq: Once | INTRAVENOUS | Status: AC
  Administered 2020-03-31: 500 mL via INTRAVENOUS

## 2020-03-31 MED ORDER — ESMOLOL HCL 100 MG/10ML IV SOLN
INTRAVENOUS | Status: AC
  Filled 2020-03-31: qty 10

## 2020-03-31 MED ORDER — SODIUM CHLORIDE 0.9 % IV SOLN
INTRAVENOUS | Status: DC | PRN
Start: 2020-03-31 — End: 2020-03-31

## 2020-03-31 MED ORDER — SUCCINYLCHOLINE CHLORIDE 200 MG/10ML IV SOSY
PREFILLED_SYRINGE | INTRAVENOUS | Status: AC
  Filled 2020-03-31: qty 10

## 2020-03-31 MED ORDER — KETOROLAC TROMETHAMINE 30 MG/ML IJ SOLN
30.0000 mg | Freq: Once | INTRAMUSCULAR | Status: AC
  Administered 2020-03-31: 30 mg via INTRAVENOUS

## 2020-03-31 MED ORDER — GLYCOPYRROLATE 0.2 MG/ML IJ SOLN
0.4000 mg | Freq: Once | INTRAMUSCULAR | Status: AC
  Administered 2020-03-31: 0.4 mg via INTRAVENOUS

## 2020-03-31 NOTE — Discharge Instructions (Addendum)
1)  The drugs that you have been given will stay in your system until tomorrow so for the       next 24 hours you should not:  A. Drive an automobile  B. Make any legal decisions  C. Drink any alcoholic beverages  2)  You may resume your regular meals upon return home.  3)  A responsible adult must take you home.  Someone should stay with you for a few          hours, then be available by phone for the remainder of the treatment day.  4)  You May experience any of the following symptoms:  Headache, Nausea and a dry mouth (due to the medications you were given),  temporary memory loss and some confusion, or sore muscles (a warm bath  should help this).  If you you experience any of these symptoms let us know on                your return visit.  5)  Report any of the following: any acute discomfort, severe headache, or temperature        greater than 100.5 F.   Also report any unusual redness, swelling, drainage, or pain         at your IV site.    You may report Symptoms to:  [place] at [facility]          Phone: [phone], ECT Department           or Dr. Prescott Gum office [phone]  6)  Your next ECT Treatment is Friday June 11 at 8:30   We will call 2 days prior to your scheduled appointment for arrival times.  7)  Nothing to eat or drink after midnight the night before your procedure.  8)  Take      With a sip of water the morning of your procedure.  9)  Other Instructions: Call [phone] to cancel the morning of your procedure due         to illness or emergency.  10) We will call within 72 hours to assess how you are feeling.

## 2020-03-31 NOTE — H&P (Signed)
Melinda [name] is an 49 y.o. female.   Chief Complaint: Continues with chronic dysphoria but no suicidal ideation and not severely depressed with no new physical complaints HPI: Chronic dysphoria and depression  Past Medical History:  Diagnosis Date  . Depression   . Diabetes mellitus without complication ([place])   . Diabetic peripheral neuropathy ([place]) 03/07/15  . Diabetic peripheral neuropathy ([place]) 03/07/15  . Diabetic peripheral neuropathy ([place]) 03/07/15  . GERD (gastroesophageal reflux disease)   . Hypercholesterolemia 03/07/15  . Hypertension   . Obesity 03/07/15  . Personality disorder ([place]) 03/07/15  . Sinus tachycardia 03/07/15   history of  . Suicidal thoughts 03/07/15    Past Surgical History:  Procedure Laterality Date  . COLONOSCOPY WITH PROPOFOL N/A 12/01/2018   Procedure: COLONOSCOPY WITH PROPOFOL;  Surgeon: Jonathon Bellows, MD;  Location: [facility] ENDOSCOPY;  Service: Gastroenterology;  Laterality: N/A;  . electroconvulsion therapy  03/07/15    Family History  Problem Relation Age of Onset  . Hypertension Father   . Diabetes Mother    Social History:  reports that she has never smoked. She has never used smokeless tobacco. She reports that she does not drink alcohol or use drugs.  Allergies:  Allergies  Allergen Reactions  . Prednisone     Increases blood sugar    (Not in a hospital admission)   Results for orders placed or performed during the hospital encounter of 03/31/20 (from the past 48 hour(s))  Glucose, capillary     Status: Abnormal   Collection Time: 03/31/20  9:44 AM  Result Value Ref Range   Glucose-Capillary 100 (H) 70 - 99 mg/dL    Comment: Glucose reference range applies only to samples taken after fasting for at least 8 hours.   No results found.  Review of Systems  Constitutional: Negative.   HENT: Negative.   Eyes: Negative.   Respiratory: Negative.   Cardiovascular: Negative.   Gastrointestinal: Negative.   Musculoskeletal: Negative.    Skin: Negative.   Neurological: Negative.   Psychiatric/Behavioral: Negative.     Blood pressure 121/77, pulse 98, temperature 98.2 F (36.8 C), temperature source Oral, resp. rate 16, height 5\' 4"  (1.626 m), weight 104.3 kg, SpO2 98 %. Physical Exam  Nursing note and vitals reviewed. Constitutional: She appears well-developed and well-nourished.  HENT:  Head: Normocephalic and atraumatic.  Eyes: Pupils are equal, round, and reactive to light. Conjunctivae are normal.  Cardiovascular: Normal heart sounds.  Respiratory: Effort normal.  GI: Soft.  Musculoskeletal:        General: Normal range of motion.     Cervical back: Normal range of motion.  Neurological: She is alert.  Skin: Skin is warm and dry.  Psychiatric: She has a normal mood and affect. Her behavior is normal. Judgment and thought content normal.     Assessment/Plan ECT today but we are going to try going to every 3 weeks to stretch out treatment to help with patient's functionality.  [name], MD 03/31/2020, 4:55 PM

## 2020-03-31 NOTE — Anesthesia Postprocedure Evaluation (Signed)
Anesthesia Post Note  Patient: Melinda [name]  Procedure(s) Performed: ECT TX  Patient location during evaluation: PACU Anesthesia Type: General Level of consciousness: awake and alert Pain management: pain level controlled Vital Signs Assessment: post-procedure vital signs reviewed and stable Respiratory status: spontaneous breathing, nonlabored ventilation, respiratory function stable and patient connected to nasal cannula oxygen Cardiovascular status: blood pressure returned to baseline and stable Postop Assessment: no apparent nausea or vomiting Anesthetic complications: no     Last Vitals:  Vitals:   03/31/20 1115 03/31/20 1127  BP: 109/84 121/77  Pulse: 100 98  Resp: (!) 28 15  Temp:  36.9 C  SpO2: 97% 98%    Last Pain:  Vitals:   03/31/20 1127  TempSrc:   PainSc: 0-No pain                 Molli Barrows

## 2020-03-31 NOTE — Transfer of Care (Signed)
Immediate Anesthesia Transfer of Care Note  Patient: Melinda [name]  Procedure(s) Performed: ECT TX  Patient Location: PACU  Anesthesia Type:General  Level of Consciousness: sedated  Airway & Oxygen Therapy: Patient Spontanous Breathing  Post-op Assessment: Report given to RN  Post vital signs: stable  Last Vitals:  Vitals Value Taken Time  BP    Temp    Pulse 103 03/31/20 1059  Resp 22 03/31/20 1059  SpO2 95 % 03/31/20 1059  Vitals shown include unvalidated device data.  Last Pain:  Vitals:   03/31/20 0929  TempSrc:   PainSc: 0-No pain         Complications: No apparent anesthesia complications

## 2020-03-31 NOTE — Anesthesia Preprocedure Evaluation (Signed)
Anesthesia Evaluation  Patient identified by MRN, date of birth, ID band Patient awake    Reviewed: Allergy & Precautions, H&P , NPO status , Patient's Chart, lab work & pertinent test results, reviewed documented beta blocker date and time   Airway Mallampati: II   Neck ROM: full    Dental  (+) Poor Dentition   Pulmonary neg pulmonary ROS, sleep apnea ,    Pulmonary exam normal        Cardiovascular Exercise Tolerance: Poor hypertension, On Medications negative cardio ROS Normal cardiovascular exam Rhythm:regular Rate:Normal     Neuro/Psych PSYCHIATRIC DISORDERS Depression Bipolar Disorder  Neuromuscular disease negative neurological ROS  negative psych ROS   GI/Hepatic negative GI ROS, Neg liver ROS, GERD  Medicated,  Endo/Other  negative endocrine ROSdiabetes  Renal/GU negative Renal ROS  negative genitourinary   Musculoskeletal   Abdominal   Peds  Hematology negative hematology ROS (+)   Anesthesia Other Findings Past Medical History: No date: Depression No date: Diabetes mellitus without complication ([place]) [id number]: Diabetic peripheral neuropathy ([place]) 03/07/15: Diabetic peripheral neuropathy ([place]) 03/07/15: Diabetic peripheral neuropathy ([facility]) No date: GERD (gastroesophageal reflux disease) 03/07/15: Hypercholesterolemia No date: Hypertension 03/07/15: Obesity 03/07/15: Personality disorder ([place]) 03/07/15: Sinus tachycardia     Comment:  history of 03/07/15: Suicidal thoughts Past Surgical History: 12/01/2018: COLONOSCOPY WITH PROPOFOL; N/A     Comment:  Procedure: COLONOSCOPY WITH PROPOFOL;  Surgeon: Jonathon Bellows, MD;  Location: [facility] ENDOSCOPY;  Service:               Gastroenterology;  Laterality: N/A; 03/07/15: electroconvulsion therapy BMI    Body Mass Index: 39.48 kg/m     Reproductive/Obstetrics negative OB ROS                             Anesthesia  Physical Anesthesia Plan  ASA: III  Anesthesia Plan: General   Post-op Pain Management:    Induction:   PONV Risk Score and Plan:   Airway Management Planned:   Additional Equipment:   Intra-op Plan:   Post-operative Plan:   Informed Consent: I have reviewed the patients History and Physical, chart, labs and discussed the procedure including the risks, benefits and alternatives for the proposed anesthesia with the patient or authorized representative who has indicated his/her understanding and acceptance.     Dental Advisory Given  Plan Discussed with: CRNA  Anesthesia Plan Comments:         Anesthesia Quick Evaluation

## 2020-03-31 NOTE — Procedures (Signed)
ECT SERVICES Physician's Interval Evaluation & Treatment Note  Patient Identification: Melinda [name] MRN:  [id number] Date of Evaluation:  03/31/2020 TX #: [id number]  MADRS:   MMSE:   P.E. Findings:  No change physical exam  Psychiatric Interval Note:  Stable no change  Subjective:  Patient is a 49 y.o. female seen for evaluation for Electroconvulsive Therapy. No specific complaints  Treatment Summary:   []   Right Unilateral             [x]  Bilateral   % Energy : 1.0 ms 35%   Impedance: 870 ohms  Seizure Energy Index: 7309 V squared  Postictal Suppression Index: 86%  Seizure Concordance Index: 96%  Medications  Pre Shock: Robinul 0.4 mg Toradol 30 mg labetalol 20 mg esmolol 20 mg Brevital 70 mg succinylcholine 100 mg  Post Shock:    Seizure Duration: 34 seconds EMG 73 seconds EEG   Comments: Follow-up 3 weeks  Lungs:  [x]   Clear to auscultation               []  Other:   Heart:    [x]   Regular rhythm             []  irregular rhythm    [x]   Previous H&P reviewed, patient examined and there are NO CHANGES                 []   Previous H&P reviewed, patient examined and there are changes noted.   [name], MD 5/21/20214:56 PM

## 2020-04-19 ENCOUNTER — Other Ambulatory Visit
Admission: RE | Admit: 2020-04-19 | Discharge: 2020-04-19 | Disposition: A | Discharge status: Home / Self Care | Payer: Medicare PPO | Source: Ambulatory Visit | Attending: Psychiatry | Admitting: Psychiatry

## 2020-04-19 ENCOUNTER — Other Ambulatory Visit: Payer: Self-pay

## 2020-04-19 DIAGNOSIS — Z20822 Contact with and (suspected) exposure to covid-19: Secondary | ICD-10-CM | POA: Insufficient documentation

## 2020-04-19 DIAGNOSIS — Z01812 Encounter for preprocedural laboratory examination: Secondary | ICD-10-CM | POA: Insufficient documentation

## 2020-04-19 LAB — SARS CORONAVIRUS 2 (TAT 6-24 HRS): SARS Coronavirus 2: NEGATIVE

## 2020-04-20 ENCOUNTER — Other Ambulatory Visit: Payer: Self-pay | Admitting: Psychiatry

## 2020-04-21 ENCOUNTER — Ambulatory Visit
Admission: RE | Admit: 2020-04-21 | Discharge: 2020-04-21 | Disposition: A | Discharge status: Home / Self Care | Payer: Medicare PPO | Source: Ambulatory Visit | Attending: Psychiatry | Admitting: Psychiatry

## 2020-04-21 ENCOUNTER — Ambulatory Visit: Payer: Self-pay | Admitting: Anesthesiology

## 2020-04-21 ENCOUNTER — Encounter: Payer: Self-pay | Admitting: Anesthesiology

## 2020-04-21 ENCOUNTER — Other Ambulatory Visit: Payer: Self-pay

## 2020-04-21 DIAGNOSIS — K219 Gastro-esophageal reflux disease without esophagitis: Secondary | ICD-10-CM | POA: Insufficient documentation

## 2020-04-21 DIAGNOSIS — G4730 Sleep apnea, unspecified: Secondary | ICD-10-CM | POA: Diagnosis not present

## 2020-04-21 DIAGNOSIS — E1140 Type 2 diabetes mellitus with diabetic neuropathy, unspecified: Secondary | ICD-10-CM | POA: Diagnosis not present

## 2020-04-21 DIAGNOSIS — E1142 Type 2 diabetes mellitus with diabetic polyneuropathy: Secondary | ICD-10-CM | POA: Insufficient documentation

## 2020-04-21 DIAGNOSIS — Z888 Allergy status to other drugs, medicaments and biological substances status: Secondary | ICD-10-CM | POA: Insufficient documentation

## 2020-04-21 DIAGNOSIS — F329 Major depressive disorder, single episode, unspecified: Secondary | ICD-10-CM | POA: Insufficient documentation

## 2020-04-21 DIAGNOSIS — I10 Essential (primary) hypertension: Secondary | ICD-10-CM | POA: Insufficient documentation

## 2020-04-21 DIAGNOSIS — Z8249 Family history of ischemic heart disease and other diseases of the circulatory system: Secondary | ICD-10-CM | POA: Diagnosis not present

## 2020-04-21 DIAGNOSIS — Z79899 Other long term (current) drug therapy: Secondary | ICD-10-CM | POA: Insufficient documentation

## 2020-04-21 DIAGNOSIS — F341 Dysthymic disorder: Secondary | ICD-10-CM | POA: Insufficient documentation

## 2020-04-21 DIAGNOSIS — F332 Major depressive disorder, recurrent severe without psychotic features: Secondary | ICD-10-CM

## 2020-04-21 DIAGNOSIS — E669 Obesity, unspecified: Secondary | ICD-10-CM | POA: Diagnosis not present

## 2020-04-21 DIAGNOSIS — F609 Personality disorder, unspecified: Secondary | ICD-10-CM | POA: Insufficient documentation

## 2020-04-21 DIAGNOSIS — Z833 Family history of diabetes mellitus: Secondary | ICD-10-CM | POA: Insufficient documentation

## 2020-04-21 DIAGNOSIS — E7800 Pure hypercholesterolemia, unspecified: Secondary | ICD-10-CM | POA: Diagnosis not present

## 2020-04-21 LAB — GLUCOSE, CAPILLARY: Glucose-Capillary: 102 mg/dL — ABNORMAL HIGH (ref 70–99)

## 2020-04-21 MED ORDER — ESMOLOL HCL 100 MG/10ML IV SOLN
INTRAVENOUS | Status: AC
  Filled 2020-04-21: qty 10

## 2020-04-21 MED ORDER — DEXTROSE-NACL 5-0.45 % IV SOLN: INTRAVENOUS | Status: DC

## 2020-04-21 MED ORDER — KETOROLAC TROMETHAMINE 30 MG/ML IJ SOLN: 30.0000 mg | Freq: Once | INTRAMUSCULAR | Status: AC

## 2020-04-21 MED ORDER — METHOHEXITAL SODIUM 100 MG/10ML IV SOSY
PREFILLED_SYRINGE | INTRAVENOUS | Status: DC | PRN
  Administered 2020-04-21: 70 mg via INTRAVENOUS

## 2020-04-21 MED ORDER — GLYCOPYRROLATE 0.2 MG/ML IJ SOLN
INTRAMUSCULAR | Status: AC
  Administered 2020-04-21: 0.4 mg via INTRAVENOUS
  Filled 2020-04-21: qty 2

## 2020-04-21 MED ORDER — SUCCINYLCHOLINE CHLORIDE 200 MG/10ML IV SOSY
PREFILLED_SYRINGE | INTRAVENOUS | Status: AC
  Filled 2020-04-21: qty 10

## 2020-04-21 MED ORDER — GLYCOPYRROLATE 0.2 MG/ML IJ SOLN: 0.4000 mg | Freq: Once | INTRAMUSCULAR | Status: AC

## 2020-04-21 MED ORDER — LABETALOL HCL 5 MG/ML IV SOLN
INTRAVENOUS | Status: DC | PRN
  Administered 2020-04-21: 30 mg via INTRAVENOUS

## 2020-04-21 MED ORDER — SUCCINYLCHOLINE CHLORIDE 20 MG/ML IJ SOLN
INTRAMUSCULAR | Status: DC | PRN
  Administered 2020-04-21: 100 mg via INTRAVENOUS

## 2020-04-21 MED ORDER — DEXTROSE 5 % IV SOLN: 500.0000 mL | Freq: Once | INTRAVENOUS | Status: DC

## 2020-04-21 MED ORDER — LABETALOL HCL 5 MG/ML IV SOLN
INTRAVENOUS | Status: AC
  Filled 2020-04-21: qty 4

## 2020-04-21 MED ORDER — KETOROLAC TROMETHAMINE 30 MG/ML IJ SOLN
INTRAMUSCULAR | Status: AC
  Administered 2020-04-21: 30 mg via INTRAVENOUS
  Filled 2020-04-21: qty 1

## 2020-04-21 NOTE — Anesthesia Postprocedure Evaluation (Signed)
Anesthesia Post Note  Patient: Melinda [name]  Procedure(s) Performed: ECT TX  Patient location during evaluation: PACU Anesthesia Type: General Level of consciousness: awake and alert Pain management: pain level controlled Vital Signs Assessment: post-procedure vital signs reviewed and stable Respiratory status: spontaneous breathing, nonlabored ventilation, respiratory function stable and patient connected to nasal cannula oxygen Cardiovascular status: blood pressure returned to baseline and stable Postop Assessment: no apparent nausea or vomiting Anesthetic complications: no   No complications documented.   Last Vitals:  Vitals:   04/21/20 1113 04/21/20 1212  BP: 130/73 128/70  Pulse: (!) 109 (!) 108  Resp: 19 18  Temp: 36.4 C 36.7 C  SpO2: 95%     Last Pain:  Vitals:   04/21/20 1212  TempSrc: Oral  PainSc: 0-No pain                 Precious Haws Ely Spragg

## 2020-04-21 NOTE — Anesthesia Preprocedure Evaluation (Signed)
Anesthesia Evaluation  Patient identified by MRN, date of birth, ID band Patient awake    Reviewed: Allergy & Precautions, H&P , NPO status , Patient's Chart, lab work & pertinent test results, reviewed documented beta blocker date and time   History of Anesthesia Complications (+) history of anesthetic complications  Airway Mallampati: II   Neck ROM: full    Dental  (+) Poor Dentition   Pulmonary neg pulmonary ROS, sleep apnea ,    Pulmonary exam normal        Cardiovascular Exercise Tolerance: Poor hypertension, On Medications negative cardio ROS Normal cardiovascular exam Rhythm:regular Rate:Normal     Neuro/Psych PSYCHIATRIC DISORDERS Depression Bipolar Disorder  Neuromuscular disease negative neurological ROS  negative psych ROS   GI/Hepatic negative GI ROS, Neg liver ROS, GERD  Medicated,  Endo/Other  negative endocrine ROSdiabetes  Renal/GU negative Renal ROS  negative genitourinary   Musculoskeletal   Abdominal   Peds  Hematology negative hematology ROS (+)   Anesthesia Other Findings Past Medical History: No date: Depression No date: Diabetes mellitus without complication ([place]) 3/55/73: Diabetic peripheral neuropathy ([place]) 03/07/15: Diabetic peripheral neuropathy ([place]) 03/07/15: Diabetic peripheral neuropathy (HCC) No date: GERD (gastroesophageal reflux disease) 03/07/15: Hypercholesterolemia No date: Hypertension 03/07/15: Obesity 03/07/15: Personality disorder ([place]) 03/07/15: Sinus tachycardia     Comment:  history of 03/07/15: Suicidal thoughts Past Surgical History: 12/01/2018: COLONOSCOPY WITH PROPOFOL; N/A     Comment:  Procedure: COLONOSCOPY WITH PROPOFOL;  Surgeon: Jonathon Bellows, MD;  Location: [facility] ENDOSCOPY;  Service:               Gastroenterology;  Laterality: N/A; 03/07/15: electroconvulsion therapy BMI    Body Mass Index: 39.48 kg/m     Reproductive/Obstetrics negative OB  ROS                             Anesthesia Physical  Anesthesia Plan  ASA: III  Anesthesia Plan: General   Post-op Pain Management:    Induction: Intravenous  PONV Risk Score and Plan: TIVA  Airway Management Planned: Mask  Additional Equipment:   Intra-op Plan:   Post-operative Plan:   Informed Consent: I have reviewed the patients History and Physical, chart, labs and discussed the procedure including the risks, benefits and alternatives for the proposed anesthesia with the patient or authorized representative who has indicated his/her understanding and acceptance.     Dental Advisory Given  Plan Discussed with: CRNA  Anesthesia Plan Comments: (Patient consented for risks of anesthesia including but not limited to:  - adverse reactions to medications - risk of intubation if required - damage to eyes, teeth, lips or other oral mucosa - nerve damage due to positioning  - sore throat or hoarseness - Damage to heart, brain, nerves, lungs, other parts of body or loss of life  Patient voiced understanding.)        Anesthesia Quick Evaluation

## 2020-04-21 NOTE — Procedures (Signed)
ECT SERVICES Physician's Interval Evaluation & Treatment Note  Patient Identification: Melinda [name] MRN:  [id number] Date of Evaluation:  04/21/2020 TX #: [id number]  MADRS:   MMSE:   P.E. Findings:  No change to physical  Psychiatric Interval Note:  Stable  Subjective:  Patient is a 49 y.o. female seen for evaluation for Electroconvulsive Therapy. No new complaint  Treatment Summary:   []   Right Unilateral             [x]  Bilateral   % Energy : 1.0 ms 35%   Impedance: 1470 ohms  Seizure Energy Index: 6220 V squared  Postictal Suppression Index: 91%  Seizure Concordance Index: 92%  Medications  Pre Shock: Robinul 0.4 mg Toradol 30 mg labetalol 20 mg esmolol 20 mg Brevital 70 mg succinylcholine 100 mg  Post Shock:    Seizure Duration: 36 seconds EMG 78 seconds EEG   Comments: Follow-up 3 weeks  Lungs:  [x]   Clear to auscultation               []  Other:   Heart:    [x]   Regular rhythm             []  irregular rhythm    [x]   Previous H&P reviewed, patient examined and there are NO CHANGES                 []   Previous H&P reviewed, patient examined and there are changes noted.   [name], MD 6/11/20215:23 PM

## 2020-04-21 NOTE — H&P (Signed)
Melinda [name] is an 49 y.o. female.   Chief Complaint: No new complaint. HPI: Chronic dysthymia  Past Medical History:  Diagnosis Date  . Depression   . Diabetes mellitus without complication ([place])   . Diabetic peripheral neuropathy ([place]) 03/07/15  . Diabetic peripheral neuropathy ([place]) 03/07/15  . Diabetic peripheral neuropathy ([place]) 03/07/15  . GERD (gastroesophageal reflux disease)   . Hypercholesterolemia 03/07/15  . Hypertension   . Obesity 03/07/15  . Personality disorder ([place]) 03/07/15  . Sinus tachycardia 03/07/15   history of  . Suicidal thoughts 03/07/15    Past Surgical History:  Procedure Laterality Date  . COLONOSCOPY WITH PROPOFOL N/A 12/01/2018   Procedure: COLONOSCOPY WITH PROPOFOL;  Surgeon: Jonathon Bellows, MD;  Location: [facility] ENDOSCOPY;  Service: Gastroenterology;  Laterality: N/A;  . electroconvulsion therapy  03/07/15    Family History  Problem Relation Age of Onset  . Hypertension Father   . Diabetes Mother    Social History:  reports that she has never smoked. She has never used smokeless tobacco. She reports that she does not drink alcohol and does not use drugs.  Allergies:  Allergies  Allergen Reactions  . Prednisone     Increases blood sugar    (Not in a hospital admission)   Results for orders placed or performed during the hospital encounter of 04/21/20 (from the past 48 hour(s))  Glucose, capillary     Status: Abnormal   Collection Time: 04/21/20  9:01 AM  Result Value Ref Range   Glucose-Capillary 102 (H) 70 - 99 mg/dL    Comment: Glucose reference range applies only to samples taken after fasting for at least 8 hours.   No results found.  Review of Systems  Constitutional: Negative.   HENT: Negative.   Eyes: Negative.   Respiratory: Negative.   Cardiovascular: Negative.   Gastrointestinal: Negative.   Musculoskeletal: Negative.   Skin: Negative.   Neurological: Negative.   Psychiatric/Behavioral: Negative.     Blood pressure  128/70, pulse (!) 108, temperature 98.1 F (36.7 C), temperature source Oral, resp. rate 18, height 5\' 4"  (1.626 m), weight 104.3 kg, SpO2 95 %. Physical Exam  Nursing note and vitals reviewed. Constitutional: She appears well-developed.  HENT:  Head: Normocephalic and atraumatic.  Eyes: Pupils are equal, round, and reactive to light. Conjunctivae are normal.  Cardiovascular: Normal heart sounds.  Respiratory: Effort normal.  GI: Soft.  Musculoskeletal:        General: Normal range of motion.     Cervical back: Normal range of motion.  Neurological: She is alert.  Skin: Skin is warm and dry.  Psychiatric: Mood and thought content normal.     Assessment/Plan ECT today and follow-up in 3 weeks  [name], MD 04/21/2020, 5:22 PM

## 2020-04-21 NOTE — Transfer of Care (Signed)
Immediate Anesthesia Transfer of Care Note  Patient: Melinda [name]  Procedure(s) Performed: ECT TX  Patient Location: PACU  Anesthesia Type:General  Level of Consciousness: sedated  Airway & Oxygen Therapy: Patient Spontanous Breathing and Patient connected to nasal cannula oxygen  Post-op Assessment: Report given to RN  Post vital signs: stable  Last Vitals:  Vitals Value Taken Time  BP 119/81 04/21/20 1053  Temp    Pulse 115 04/21/20 1053  Resp 21 04/21/20 1053  SpO2 95 % 04/21/20 1053  Vitals shown include unvalidated device data.  Last Pain:  Vitals:   04/21/20 0901  TempSrc:   PainSc: 0-No pain         Complications: No complications documented.

## 2020-04-22 ENCOUNTER — Other Ambulatory Visit: Payer: Self-pay | Admitting: Psychiatry

## 2020-04-26 ENCOUNTER — Telehealth: Payer: Self-pay

## 2020-04-27 ENCOUNTER — Other Ambulatory Visit: Payer: Self-pay | Admitting: Psychiatry

## 2020-04-27 MED ORDER — ZIPRASIDONE HCL 80 MG PO CAPS: ORAL_CAPSULE | ORAL | 1 refills | Status: DC

## 2020-04-27 MED ORDER — ESCITALOPRAM OXALATE 20 MG PO TABS: 20.0000 mg | ORAL_TABLET | Freq: Every day | ORAL | 1 refills | Status: DC

## 2020-04-27 MED ORDER — BUPROPION HCL ER (XL) 300 MG PO TB24: 300.0000 mg | ORAL_TABLET | Freq: Every day | ORAL | 1 refills | Status: DC

## 2020-05-10 ENCOUNTER — Other Ambulatory Visit
Admission: RE | Admit: 2020-05-10 | Discharge: 2020-05-10 | Disposition: A | Discharge status: Home / Self Care | Payer: Medicare PPO | Source: Ambulatory Visit | Attending: Psychiatry | Admitting: Psychiatry

## 2020-05-10 ENCOUNTER — Other Ambulatory Visit: Payer: Self-pay

## 2020-05-10 DIAGNOSIS — Z20822 Contact with and (suspected) exposure to covid-19: Secondary | ICD-10-CM | POA: Insufficient documentation

## 2020-05-10 DIAGNOSIS — Z01812 Encounter for preprocedural laboratory examination: Secondary | ICD-10-CM | POA: Diagnosis not present

## 2020-05-10 LAB — SARS CORONAVIRUS 2 (TAT 6-24 HRS): SARS Coronavirus 2: NEGATIVE

## 2020-05-11 ENCOUNTER — Other Ambulatory Visit: Payer: Self-pay | Admitting: Psychiatry

## 2020-05-12 ENCOUNTER — Other Ambulatory Visit: Payer: Self-pay

## 2020-05-12 ENCOUNTER — Encounter: Payer: Self-pay | Admitting: Certified Registered Nurse Anesthetist

## 2020-05-12 ENCOUNTER — Encounter
Admission: RE | Admit: 2020-05-12 | Discharge: 2020-05-12 | Disposition: A | Discharge status: Home / Self Care | Payer: Medicare PPO | Source: Ambulatory Visit | Attending: Psychiatry | Admitting: Psychiatry

## 2020-05-12 DIAGNOSIS — E7800 Pure hypercholesterolemia, unspecified: Secondary | ICD-10-CM | POA: Insufficient documentation

## 2020-05-12 DIAGNOSIS — E1142 Type 2 diabetes mellitus with diabetic polyneuropathy: Secondary | ICD-10-CM | POA: Insufficient documentation

## 2020-05-12 DIAGNOSIS — I10 Essential (primary) hypertension: Secondary | ICD-10-CM | POA: Diagnosis not present

## 2020-05-12 DIAGNOSIS — F329 Major depressive disorder, single episode, unspecified: Secondary | ICD-10-CM | POA: Diagnosis present

## 2020-05-12 DIAGNOSIS — K219 Gastro-esophageal reflux disease without esophagitis: Secondary | ICD-10-CM | POA: Diagnosis not present

## 2020-05-12 DIAGNOSIS — Z7984 Long term (current) use of oral hypoglycemic drugs: Secondary | ICD-10-CM | POA: Diagnosis not present

## 2020-05-12 DIAGNOSIS — F609 Personality disorder, unspecified: Secondary | ICD-10-CM | POA: Insufficient documentation

## 2020-05-12 DIAGNOSIS — F332 Major depressive disorder, recurrent severe without psychotic features: Secondary | ICD-10-CM

## 2020-05-12 DIAGNOSIS — F339 Major depressive disorder, recurrent, unspecified: Secondary | ICD-10-CM | POA: Diagnosis not present

## 2020-05-12 DIAGNOSIS — Z79899 Other long term (current) drug therapy: Secondary | ICD-10-CM | POA: Insufficient documentation

## 2020-05-12 DIAGNOSIS — Z6839 Body mass index (BMI) 39.0-39.9, adult: Secondary | ICD-10-CM | POA: Diagnosis not present

## 2020-05-12 LAB — GLUCOSE, CAPILLARY: Glucose-Capillary: 92 mg/dL (ref 70–99)

## 2020-05-12 MED ORDER — GLYCOPYRROLATE 0.2 MG/ML IJ SOLN
INTRAMUSCULAR | Status: AC
  Filled 2020-05-12: qty 1

## 2020-05-12 MED ORDER — KETAMINE HCL 10 MG/ML IJ SOLN
INTRAMUSCULAR | Status: DC | PRN
  Administered 2020-05-12: 100 mg via INTRAVENOUS

## 2020-05-12 MED ORDER — SODIUM CHLORIDE 0.9 % IV SOLN
500.0000 mL | Freq: Once | INTRAVENOUS | Status: AC
  Administered 2020-05-12: 500 mL via INTRAVENOUS

## 2020-05-12 MED ORDER — PROPOFOL 10 MG/ML IV BOLUS
INTRAVENOUS | Status: DC | PRN
Start: 2020-05-12 — End: 2020-05-12
  Administered 2020-05-12: 30 mg via INTRAVENOUS

## 2020-05-12 MED ORDER — KETOROLAC TROMETHAMINE 30 MG/ML IJ SOLN
INTRAMUSCULAR | Status: AC
  Administered 2020-05-12: 30 mg via INTRAVENOUS
  Filled 2020-05-12: qty 1

## 2020-05-12 MED ORDER — SUCCINYLCHOLINE CHLORIDE 200 MG/10ML IV SOSY
PREFILLED_SYRINGE | INTRAVENOUS | Status: DC | PRN
  Administered 2020-05-12: 100 mg via INTRAVENOUS

## 2020-05-12 MED ORDER — KETOROLAC TROMETHAMINE 30 MG/ML IJ SOLN: 30.0000 mg | Freq: Once | INTRAMUSCULAR | Status: AC

## 2020-05-12 MED ORDER — GLYCOPYRROLATE 0.2 MG/ML IJ SOLN
0.4000 mg | Freq: Once | INTRAMUSCULAR | Status: AC
  Administered 2020-05-12: 0.4 mg via INTRAVENOUS

## 2020-05-12 MED ORDER — ESMOLOL HCL 100 MG/10ML IV SOLN
INTRAVENOUS | Status: DC | PRN
Start: 2020-05-12 — End: 2020-05-12
  Administered 2020-05-12: 20 mg via INTRAVENOUS

## 2020-05-12 NOTE — Anesthesia Procedure Notes (Signed)
Procedure Name: General with mask airway Date/Time: 05/12/2020 10:30 AM Performed by: Caryl Asp, CRNA Pre-anesthesia Checklist: Patient identified, Emergency Drugs available, Suction available and Patient being monitored Patient Re-evaluated:Patient Re-evaluated prior to induction Oxygen Delivery Method: Circle system utilized Preoxygenation: Pre-oxygenation with 100% oxygen Induction Type: IV induction Ventilation: Mask ventilation without difficulty and Mask ventilation throughout procedure Airway Equipment and Method: Bite block Placement Confirmation: positive ETCO2 Dental Injury: Teeth and Oropharynx as per pre-operative assessment

## 2020-05-12 NOTE — Anesthesia Postprocedure Evaluation (Signed)
Anesthesia Post Note  Patient: Melinda [name]  Procedure(s) Performed: ECT TX  Patient location during evaluation: PACU Anesthesia Type: General Level of consciousness: awake and alert Pain management: pain level controlled Vital Signs Assessment: post-procedure vital signs reviewed and stable Respiratory status: spontaneous breathing, nonlabored ventilation and respiratory function stable Cardiovascular status: blood pressure returned to baseline and stable Postop Assessment: no apparent nausea or vomiting Anesthetic complications: no   No complications documented.   Last Vitals:  Vitals:   05/12/20 1111 05/12/20 1120  BP: 123/71 123/72  Pulse: 91 90  Resp: 17 16  Temp: (!) 36.3 C 36.6 C  SpO2: 94%     Last Pain:  Vitals:   05/12/20 1120  TempSrc: Oral  PainSc: 0-No pain                 Brett Canales Jaremy Nosal

## 2020-05-12 NOTE — H&P (Signed)
Melinda [name] is an 49 y.o. female.   Chief Complaint: chronic depression HPI: recurrent depression  Past Medical History:  Diagnosis Date  . Depression   . Diabetes mellitus without complication ([place])   . Diabetic peripheral neuropathy ([place]) 03/07/15  . Diabetic peripheral neuropathy ([place]) 03/07/15  . Diabetic peripheral neuropathy ([place]) 03/07/15  . GERD (gastroesophageal reflux disease)   . Hypercholesterolemia 03/07/15  . Hypertension   . Obesity 03/07/15  . Personality disorder ([place]) 03/07/15  . Sinus tachycardia 03/07/15   history of  . Suicidal thoughts 03/07/15    Past Surgical History:  Procedure Laterality Date  . COLONOSCOPY WITH PROPOFOL N/A 12/01/2018   Procedure: COLONOSCOPY WITH PROPOFOL;  Surgeon: Jonathon Bellows, MD;  Location: [facility] ENDOSCOPY;  Service: Gastroenterology;  Laterality: N/A;  . electroconvulsion therapy  03/07/15    Family History  Problem Relation Age of Onset  . Hypertension Father   . Diabetes Mother    Social History:  reports that she has never smoked. She has never used smokeless tobacco. She reports that she does not drink alcohol and does not use drugs.  Allergies:  Allergies  Allergen Reactions  . Prednisone     Increases blood sugar    (Not in a hospital admission)   Results for orders placed or performed during the hospital encounter of 05/12/20 (from the past 48 hour(s))  Glucose, capillary     Status: None   Collection Time: 05/12/20  9:24 AM  Result Value Ref Range   Glucose-Capillary 92 70 - 99 mg/dL    Comment: Glucose reference range applies only to samples taken after fasting for at least 8 hours.   No results found.  Review of Systems  Constitutional: Negative.   HENT: Negative.   Eyes: Negative.   Respiratory: Negative.   Cardiovascular: Negative.   Gastrointestinal: Negative.   Musculoskeletal: Negative.   Skin: Negative.   Neurological: Negative.   Psychiatric/Behavioral: Negative.     Blood pressure (!)  142/87, pulse 93, temperature 98.1 F (36.7 C), temperature source Oral, resp. rate 18, height 5\' 4"  (1.626 m), weight 104.3 kg, SpO2 98 %. Physical Exam Vitals and nursing note reviewed.  Constitutional:      Appearance: She is well-developed.  HENT:     Head: Normocephalic and atraumatic.  Eyes:     Conjunctiva/sclera: Conjunctivae normal.     Pupils: Pupils are equal, round, and reactive to light.  Cardiovascular:     Heart sounds: Normal heart sounds.  Pulmonary:     Effort: Pulmonary effort is normal.  Abdominal:     Palpations: Abdomen is soft.  Musculoskeletal:        General: Normal range of motion.     Cervical back: Normal range of motion.  Skin:    General: Skin is warm and dry.  Neurological:     General: No focal deficit present.     Mental Status: She is alert.  Psychiatric:        Mood and Affect: Mood normal.      Assessment/Plan ECT today and follow up 2 weeks  [name], MD 05/12/2020, 9:57 AM

## 2020-05-12 NOTE — Anesthesia Preprocedure Evaluation (Signed)
Anesthesia Evaluation  Patient identified by MRN, date of birth, ID band Patient awake    Reviewed: Allergy & Precautions, H&P , NPO status , Patient's Chart, lab work & pertinent test results  Airway Mallampati: III  TM Distance: >3 FB Neck ROM: full    Dental  (+) Chipped   Pulmonary sleep apnea , neg recent URI, Not current smoker,           Cardiovascular hypertension, (-) angina(-) Past MI      Neuro/Psych PSYCHIATRIC DISORDERS Depression Bipolar Disorder Peripheral neuropathy    GI/Hepatic Neg liver ROS, GERD  Controlled,  Endo/Other  diabetesMorbid obesity  Renal/GU negative Renal ROS  negative genitourinary   Musculoskeletal   Abdominal   Peds  Hematology negative hematology ROS (+)   Anesthesia Other Findings Past Medical History: No date: Depression No date: Diabetes mellitus without complication (HCC) 03/07/15: Diabetic peripheral neuropathy (HCC) 03/07/15: Diabetic peripheral neuropathy (HCC) 03/07/15: Diabetic peripheral neuropathy (HCC) No date: GERD (gastroesophageal reflux disease) 03/07/15: Hypercholesterolemia No date: Hypertension 03/07/15: Obesity 03/07/15: Personality disorder (HCC) 03/07/15: Sinus tachycardia     Comment:  history of 03/07/15: Suicidal thoughts  Past Surgical History: 12/01/2018: COLONOSCOPY WITH PROPOFOL; N/A     Comment:  Procedure: COLONOSCOPY WITH PROPOFOL;  Surgeon: Anna,               Kiran, MD;  Location: [facility] ENDOSCOPY;  Service:               Gastroenterology;  Laterality: N/A; 03/07/15: electroconvulsion therapy  BMI    Body Mass Index: 39.48 kg/m      Reproductive/Obstetrics negative OB ROS                                                              Anesthesia Evaluation  Patient identified by MRN, date of birth, ID band Patient awake    Reviewed: Allergy & Precautions, H&P , NPO status , Patient's Chart, lab work & pertinent test  results  History of Anesthesia Complications Negative for: history of anesthetic complications  Airway Mallampati: II  TM Distance: >3 FB Neck ROM: full    Dental  (+) Chipped   Pulmonary sleep apnea , neg COPD, neg recent URI,           Cardiovascular hypertension, (-) angina     Neuro/Psych PSYCHIATRIC DISORDERS Depression Bipolar Disorder negative neurological ROS     GI/Hepatic Neg liver ROS, GERD  Controlled,  Endo/Other  diabetesMorbid obesity  Renal/GU negative Renal ROS  negative genitourinary   Musculoskeletal   Abdominal   Peds  Hematology negative hematology ROS (+)   Anesthesia Other Findings Past Medical History: No date: Depression No date: Diabetes mellitus without complication (HCC) 03/07/15: Diabetic peripheral neuropathy (HCC) 03/07/15: Diabetic peripheral neuropathy (HCC) 03/07/15: Diabetic peripheral neuropathy (HCC) No date: GERD (gastroesophageal reflux disease) 03/07/15: Hypercholesterolemia No date: Hypertension 03/07/15: Obesity 03/07/15: Personality disorder (HCC) 03/07/15: Sinus tachycardia     Comment:  history of 03/07/15: Suicidal thoughts  Past Surgical History: 12/01/2018: COLONOSCOPY WITH PROPOFOL; N/A     Comment:  Procedure: COLONOSCOPY WITH PROPOFOL;  Surgeon: Anna,               Kiran, MD;  Location: [facility] ENDOSCOPY;  Service:                 Gastroenterology;  Laterality: N/A; 03/07/15: electroconvulsion therapy  BMI    Body Mass Index: 44.52 kg/m      Reproductive/Obstetrics negative OB ROS                             Anesthesia Physical  Anesthesia Plan  ASA: III  Anesthesia Plan: General   Post-op Pain Management:    Induction:   PONV Risk Score and Plan:   Airway Management Planned: Mask  Additional Equipment:   Intra-op Plan:   Post-operative Plan:   Informed Consent: I have reviewed the patients History and Physical, chart, labs and discussed the procedure  including the risks, benefits and alternatives for the proposed anesthesia with the patient or authorized representative who has indicated his/her understanding and acceptance.     Dental Advisory Given  Plan Discussed with: Anesthesiologist and CRNA  Anesthesia Plan Comments:         Anesthesia Quick Evaluation                                   Anesthesia Evaluation  Patient identified by MRN, date of birth, ID band Patient awake    Reviewed: Allergy & Precautions, H&P , NPO status , Patient's Chart, lab work & pertinent test results  History of Anesthesia Complications Negative for: history of anesthetic complications  Airway Mallampati: II  TM Distance: >3 FB Neck ROM: full    Dental  (+) Chipped   Pulmonary sleep apnea , neg COPD, neg recent URI,           Cardiovascular hypertension, (-) angina     Neuro/Psych PSYCHIATRIC DISORDERS Depression Bipolar Disorder negative neurological ROS     GI/Hepatic Neg liver ROS, GERD  Controlled,  Endo/Other  diabetesMorbid obesity  Renal/GU negative Renal ROS  negative genitourinary   Musculoskeletal   Abdominal   Peds  Hematology negative hematology ROS (+)   Anesthesia Other Findings Past Medical History: No date: Depression No date: Diabetes mellitus without complication (HCC) 03/07/15: Diabetic peripheral neuropathy (HCC) 03/07/15: Diabetic peripheral neuropathy (HCC) 03/07/15: Diabetic peripheral neuropathy (HCC) No date: GERD (gastroesophageal reflux disease) 03/07/15: Hypercholesterolemia No date: Hypertension 03/07/15: Obesity 03/07/15: Personality disorder (HCC) 03/07/15: Sinus tachycardia     Comment:  history of 03/07/15: Suicidal thoughts  Past Surgical History: 12/01/2018: COLONOSCOPY WITH PROPOFOL; N/A     Comment:  Procedure: COLONOSCOPY WITH PROPOFOL;  Surgeon: Anna,               Kiran, MD;  Location: [facility] ENDOSCOPY;  Service:               Gastroenterology;  Laterality:  N/A; 03/07/15: electroconvulsion therapy  BMI    Body Mass Index: 44.52 kg/m      Reproductive/Obstetrics negative OB ROS                             Anesthesia Physical  Anesthesia Plan  ASA: III  Anesthesia Plan: General   Post-op Pain Management:    Induction:   PONV Risk Score and Plan:   Airway Management Planned: Mask  Additional Equipment:   Intra-op Plan:   Post-operative Plan:   Informed Consent: I have reviewed the patients History and Physical, chart, labs and discussed the procedure including the risks, benefits and alternatives for the proposed anesthesia with the patient or authorized   representative who has indicated his/her understanding and acceptance.     Dental Advisory Given  Plan Discussed with: Anesthesiologist and CRNA  Anesthesia Plan Comments:         Anesthesia Quick Evaluation  Anesthesia Physical  Anesthesia Plan  ASA: II  Anesthesia Plan: General   Post-op Pain Management:    Induction:   PONV Risk Score and Plan:   Airway Management Planned: Mask  Additional Equipment:   Intra-op Plan:   Post-operative Plan:   Informed Consent: I have reviewed the patients History and Physical, chart, labs and discussed the procedure including the risks, benefits and alternatives for the proposed anesthesia with the patient or authorized representative who has indicated his/her understanding and acceptance.     Dental Advisory Given  Plan Discussed with: Anesthesiologist, CRNA and Surgeon  Anesthesia Plan Comments:         Anesthesia Quick Evaluation  

## 2020-05-12 NOTE — Transfer of Care (Signed)
Immediate Anesthesia Transfer of Care Note  Patient: Melinda [name]  Procedure(s) Performed: ECT TX  Patient Location: PACU  Anesthesia Type:General  Level of Consciousness: awake, alert  and patient cooperative  Airway & Oxygen Therapy: Patient Spontanous Breathing and Patient connected to face mask oxygen  Post-op Assessment: Report given to RN and Post -op Vital signs reviewed and stable  Post vital signs: Reviewed and stable  Last Vitals:  Vitals Value Taken Time  BP    Temp    Pulse    Resp    SpO2      Last Pain:  Vitals:   05/12/20 0854  TempSrc:   PainSc: 0-No pain         Complications: No complications documented.

## 2020-05-12 NOTE — Procedures (Signed)
ECT SERVICES Physician's Interval Evaluation & Treatment Note  Patient Identification: Melinda [name] MRN:  [id number] Date of Evaluation:  05/12/2020 TX #: [id number]  MADRS:   MMSE:   P.E. Findings:  Normal physical exam  Psychiatric Interval Note:  Melinda [name] is her usual anxious dysphoric self.  Not psychotic not acutely suicidal  Subjective:  Patient is a 49 y.o. female seen for evaluation for Electroconvulsive Therapy. Chronic anxiety and dysphoria.  Treatment Summary:   []   Right Unilateral             [x]  Bilateral   % Energy : 1.0 ms 35%   Impedance: 900 ohms  Seizure Energy Index: 6346 V squared  Postictal Suppression Index: Good postictal suppression although the computer did not get a reading  Seizure Concordance Index: 96%  Medications  Pre Shock: Robinul 0.4 mg Toradol 30 mg labetalol 20 mg esmolol 20 mg ketamine 100 mg propofol 30 mg succinylcholine 100 mg  Post Shock: None  Seizure Duration: 36 seconds EMG 55 seconds EEG   Comments: We will see her back in 2 weeks because after that we are having a 2-week break and I do not want her to have to go 5 weeks.  Lungs:  [x]   Clear to auscultation               []  Other:   Heart:    [x]   Regular rhythm             []  irregular rhythm    [x]   Previous H&P reviewed, patient examined and there are NO CHANGES                 []   Previous H&P reviewed, patient examined and there are changes noted.   [name], MD 7/2/20217:41 PM

## 2020-05-16 ENCOUNTER — Ambulatory Visit: Payer: Medicare PPO | Admitting: Physician Assistant

## 2020-05-16 ENCOUNTER — Other Ambulatory Visit: Payer: Self-pay

## 2020-05-16 ENCOUNTER — Encounter: Payer: Self-pay | Admitting: Physician Assistant

## 2020-05-16 VITALS — BP 128/86 | HR 104 | Temp 98.8°F | Wt 228.4 lb

## 2020-05-16 DIAGNOSIS — E7800 Pure hypercholesterolemia, unspecified: Secondary | ICD-10-CM

## 2020-05-16 DIAGNOSIS — I10 Essential (primary) hypertension: Secondary | ICD-10-CM | POA: Diagnosis not present

## 2020-05-16 DIAGNOSIS — E119 Type 2 diabetes mellitus without complications: Secondary | ICD-10-CM

## 2020-05-16 DIAGNOSIS — Z1159 Encounter for screening for other viral diseases: Secondary | ICD-10-CM

## 2020-05-16 LAB — POCT GLYCOSYLATED HEMOGLOBIN (HGB A1C)
Est. average glucose Bld gHb Est-mCnc: 131
Hemoglobin A1C: 6.2 % — AB (ref 4.0–5.6)

## 2020-05-16 NOTE — Patient Instructions (Signed)
Diabetes Mellitus and Exercise Exercising regularly is important for your overall health, especially when you have diabetes (diabetes mellitus). Exercising is not only about losing weight. It has many other health benefits, such as increasing muscle strength and bone density and reducing body fat and stress. This leads to improved fitness, flexibility, and endurance, all of which result in better overall health. Exercise has additional benefits for people with diabetes, including:  Reducing appetite.  Helping to lower and control blood glucose.  Lowering blood pressure.  Helping to control amounts of fatty substances (lipids) in the blood, such as cholesterol and triglycerides.  Helping the body to respond better to insulin (improving insulin sensitivity).  Reducing how much insulin the body needs.  Decreasing the risk for heart disease by: ? Lowering cholesterol and triglyceride levels. ? Increasing the levels of good cholesterol. ? Lowering blood glucose levels. What is my activity plan? Your health care provider or certified diabetes educator can help you make a plan for the type and frequency of exercise (activity plan) that works for you. Make sure that you:  Do at least 150 minutes of moderate-intensity or vigorous-intensity exercise each week. This could be brisk walking, biking, or water aerobics. ? Do stretching and strength exercises, such as yoga or weightlifting, at least 2 times a week. ? Spread out your activity over at least 3 days of the week.  Get some form of physical activity every day. ? Do not go more than 2 days in a row without some kind of physical activity. ? Avoid being inactive for more than 30 minutes at a time. Take frequent breaks to walk or stretch.  Choose a type of exercise or activity that you enjoy, and set realistic goals.  Start slowly, and gradually increase the intensity of your exercise over time. What do I need to know about managing my  diabetes?   Check your blood glucose before and after exercising. ? If your blood glucose is 240 mg/dL (13.3 mmol/L) or higher before you exercise, check your urine for ketones. If you have ketones in your urine, do not exercise until your blood glucose returns to normal. ? If your blood glucose is 100 mg/dL (5.6 mmol/L) or lower, eat a snack containing 15-20 grams of carbohydrate. Check your blood glucose 15 minutes after the snack to make sure that your level is above 100 mg/dL (5.6 mmol/L) before you start your exercise.  Know the symptoms of low blood glucose (hypoglycemia) and how to treat it. Your risk for hypoglycemia increases during and after exercise. Common symptoms of hypoglycemia can include: ? Hunger. ? Anxiety. ? Sweating and feeling clammy. ? Confusion. ? Dizziness or feeling light-headed. ? Increased heart rate or palpitations. ? Blurry vision. ? Tingling or numbness around the mouth, lips, or tongue. ? Tremors or shakes. ? Irritability.  Keep a rapid-acting carbohydrate snack available before, during, and after exercise to help prevent or treat hypoglycemia.  Avoid injecting insulin into areas of the body that are going to be exercised. For example, avoid injecting insulin into: ? The arms, when playing tennis. ? The legs, when jogging.  Keep records of your exercise habits. Doing this can help you and your health care provider adjust your diabetes management plan as needed. Write down: ? Food that you eat before and after you exercise. ? Blood glucose levels before and after you exercise. ? The type and amount of exercise you have done. ? When your insulin is expected to peak, if you use   insulin. Avoid exercising at times when your insulin is peaking.  When you start a new exercise or activity, work with your health care provider to make sure the activity is safe for you, and to adjust your insulin, medicines, or food intake as needed.  Drink plenty of water while  you exercise to prevent dehydration or heat stroke. Drink enough fluid to keep your urine clear or pale yellow. Summary  Exercising regularly is important for your overall health, especially when you have diabetes (diabetes mellitus).  Exercising has many health benefits, such as increasing muscle strength and bone density and reducing body fat and stress.  Your health care provider or certified diabetes educator can help you make a plan for the type and frequency of exercise (activity plan) that works for you.  When you start a new exercise or activity, work with your health care provider to make sure the activity is safe for you, and to adjust your insulin, medicines, or food intake as needed. This information is not intended to replace advice given to you by your health care provider. Make sure you discuss any questions you have with your health care provider. Document Revised: 05/22/2017 Document Reviewed: 04/08/2016 Elsevier Patient Education  2020 Elsevier Inc.  

## 2020-05-16 NOTE — Progress Notes (Signed)
Established patient visit   Patient: Melinda [name]   DOB: 01-06-1971   49 y.o. Female  MRN: [id number] Visit Date: 05/16/2020  Today's healthcare provider: Trinna Post, PA-C   Chief Complaint  Patient presents with  . Diabetes  . Hyperlipidemia  . Hypertension  I,Porsha C McClurkin,acting as a scribe for [employer], PA-C.,have documented all relevant documentation on the behalf of Trinna Post, PA-C,as directed by  Trinna Post, PA-C while in the presence of Trinna Post, PA-C.  Subjective    HPI  Diabetes Mellitus Type II, follow-up  Lab Results  Component Value Date   HGBA1C 6.3 (A) 01/12/2020   HGBA1C 6.9 (H) 09/14/2019   HGBA1C 7.0 (A) 05/13/2019   Last seen for diabetes 4 months ago.  Management since then includes continuing the same treatment. She reports good compliance with treatment. She is not having side effects.   Home blood sugar records: fasting range: 90's-120's  Episodes of hypoglycemia? No    Current insulin regiment: None Most Recent Eye Exam: 02/21/2020  --------------------------------------------------------------------------------------------------- Hypertension, follow-up  BP Readings from Last 3 Encounters:  05/16/20 128/86  01/12/20 132/82  09/14/19 122/81   Wt Readings from Last 3 Encounters:  05/16/20 228 lb 6.4 oz (103.6 kg)  01/12/20 229 lb 9.6 oz (104.1 kg)  09/14/19 238 lb 12.8 oz (108.3 kg)     She was last seen for hypertension 4 months ago.  BP at that visit was 132/82. Management since that visit includes continue current medication. She reports good compliance with treatment. She is not having side effects.  She is not exercising. She is adherent to low salt diet.   Outside blood pressures are not being checked.  She does not smoke.  Use of agents associated with hypertension: none.   ---------------------------------------------------------------------------------------------------  Lipid/Cholesterol, follow-up  Last Lipid Panel: Lab Results  Component Value Date   CHOL 130 09/14/2019   LDLCALC 70 09/14/2019   HDL 47 09/14/2019   TRIG 59 09/14/2019    She was last seen for this 4 months ago.  Management since that visit includes continue current medication.  She reports good compliance with treatment. She is not having side effects.   Symptoms: No appetite changes No foot ulcerations  No chest pain No chest pressure/discomfort  No dyspnea No orthopnea  No fatigue No lower extremity edema  No palpitations No paroxysmal nocturnal dyspnea  No nausea No numbness or tingling of extremity  No polydipsia No polyuria  No speech difficulty No syncope   She is following a Regular diet. Current exercise: housecleaning and no regular exercise  Last metabolic panel Lab Results  Component Value Date   GLUCOSE 115 (H) 09/14/2019   NA 143 09/14/2019   K 3.7 09/14/2019   BUN 11 09/14/2019   CREATININE 0.56 (L) 09/14/2019   GFRNONAA 109 09/14/2019   GFRAA 126 09/14/2019   CALCIUM 9.6 09/14/2019   AST 10 09/14/2019   ALT 8 09/14/2019   The 10-year ASCVD risk score Mikey Bussing DC Jr., et al., 2013) is: 7.2%  ---------------------------------------------------------------------------------------------------  Patient reports sometimes is has diarrhea and mess her bed up. She reports it does not happens often. Patient reports she has not had a menstrual cycle for over 1year now and was thinking she my be going through menopause.      Medications: Outpatient Medications Prior to Visit  Medication Sig  . Accu-Chek Softclix Lancets lancets USE AS DIRECTED  . Alcohol Swabs (B-D SINGLE  USE SWABS REGULAR) PADS USE AS DIRECTED  . atorvastatin (LIPITOR) 10 MG tablet TAKE 1 TABLET AT BEDTIME  . buPROPion (WELLBUTRIN XL) 300 MG 24 hr tablet TAKE 1 TABLET EVERY DAY  . buPROPion (WELLBUTRIN XL) 300 MG 24 hr tablet Take 1 tablet (300 mg total) by mouth daily.  Marland Kitchen escitalopram  (LEXAPRO) 20 MG tablet TAKE 1 TABLET EVERY DAY  . escitalopram (LEXAPRO) 20 MG tablet Take 1 tablet (20 mg total) by mouth daily.  Marland Kitchen glucose blood (ACCU-CHEK AVIVA PLUS) test strip CHECK FASTING BLOOD SUGARS EVERY MORNING  . Lancets Misc. (ACCU-CHEK FASTCLIX LANCET) KIT   . lisinopril (ZESTRIL) 10 MG tablet Take 1 tablet (10 mg total) by mouth daily.  . metFORMIN (GLUCOPHAGE-XR) 500 MG 24 hr tablet TAKE 1 TABLET TWICE DAILY  . ziprasidone (GEODON) 80 MG capsule TAKE 1 CAPSULE TWO TIMES DAILY WITH A MEAL.  . ziprasidone (GEODON) 80 MG capsule Twice a day   No facility-administered medications prior to visit.    Review of Systems  Respiratory: Negative.   Cardiovascular: Negative.   Hematological: Negative.       Objective    BP 128/86 (BP Location: Left Arm, Patient Position: Sitting, Cuff Size: Large)   Pulse (!) 104   Temp 98.8 F (37.1 C) (Oral)   Wt 228 lb 6.4 oz (103.6 kg)   LMP  (LMP Unknown) Comment: pt states she has not had period in the past year  SpO2 96%   BMI 39.20 kg/m    Physical Exam Constitutional:      Appearance: Normal appearance.  Cardiovascular:     Rate and Rhythm: Normal rate and regular rhythm.     Pulses: Normal pulses.     Heart sounds: Normal heart sounds.  Pulmonary:     Effort: Pulmonary effort is normal.     Breath sounds: Normal breath sounds.  Skin:    General: Skin is warm and dry.  Neurological:     General: No focal deficit present.     Mental Status: She is alert and oriented to person, place, and time.  Psychiatric:        Mood and Affect: Mood normal.        Behavior: Behavior normal.       No results found for any visits on 05/16/20.  Assessment & Plan    1. Controlled type 2 diabetes mellitus without complication, without long-term current use of insulin (HCC) Well controlled with last A1c 6.3 Continue current medications UTD on vaccines, eye exam, foot exam On ACEi On Statin Discussed diet and exercise   - POCT  glycosylated hemoglobin (Hb A1C) - Comprehensive metabolic panel  2. Essential hypertension Well controlled Continue current medications Recheck metabolic panel  - Comprehensive metabolic panel  3. Hypercholesteremia Previously well controlled Continue statin Repeat FLP and CMP Goal LDL < 70  - Comprehensive metabolic panel - Lipid panel  4. Need for hepatitis C screening test  - Hepatitis C antibody   Return in about 6 months (around 11/16/2020) for DM,HTN,HLD.         Paulene Floor  [facility] [phone] (phone) [phone] (fax)  [place]

## 2020-05-17 ENCOUNTER — Telehealth: Payer: Self-pay

## 2020-05-17 ENCOUNTER — Other Ambulatory Visit: Payer: Self-pay | Admitting: Physician Assistant

## 2020-05-17 DIAGNOSIS — E119 Type 2 diabetes mellitus without complications: Secondary | ICD-10-CM

## 2020-05-17 LAB — LIPID PANEL
Chol/HDL Ratio: 2.7 ratio (ref 0.0–4.4)
Cholesterol, Total: 133 mg/dL (ref 100–199)
HDL: 50 mg/dL (ref >39)
LDL Chol Calc (NIH): 68 mg/dL (ref 0–99)
Triglycerides: 74 mg/dL (ref 0–149)
VLDL Cholesterol Cal: 15 mg/dL (ref 5–40)

## 2020-05-17 LAB — COMPREHENSIVE METABOLIC PANEL
ALT: 7 IU/L (ref 0–32)
AST: 10 IU/L (ref 0–40)
Albumin/Globulin Ratio: 1.6 (ref 1.2–2.2)
Albumin: 4.2 g/dL (ref 3.8–4.9)
Alkaline Phosphatase: 116 IU/L (ref 48–121)
BUN/Creatinine Ratio: 15 (ref 9–23)
BUN: 13 mg/dL (ref 6–24)
Bilirubin Total: 0.3 mg/dL (ref 0.0–1.2)
CO2: 21 mmol/L (ref 20–29)
Calcium: 9.5 mg/dL (ref 8.7–10.2)
Chloride: 106 mmol/L (ref 96–106)
Creatinine, Ser: 0.86 mg/dL (ref 0.57–1.00)
GFR calc Af Amer: 90 mL/min/{1.73_m2} (ref >59)
GFR calc non Af Amer: 78 mL/min/{1.73_m2} (ref >59)
Globulin, Total: 2.6 g/dL (ref 1.5–4.5)
Glucose: 102 mg/dL — ABNORMAL HIGH (ref 65–99)
Potassium: 3.9 mmol/L (ref 3.5–5.2)
Sodium: 140 mmol/L (ref 134–144)
Total Protein: 6.8 g/dL (ref 6.0–8.5)

## 2020-05-17 LAB — HEPATITIS C ANTIBODY: Hep C Virus Ab: 0.6 s/co ratio (ref 0.0–0.9)

## 2020-05-17 NOTE — Telephone Encounter (Signed)
-----   Message from Trinna Post, [place] sent at 05/17/2020  8:41 AM EDT ----- Her labwork is stable, cholesterol well controlled.

## 2020-05-18 NOTE — Telephone Encounter (Addendum)
Patient's call was returned and she was advised of lab results.

## 2020-05-18 NOTE — Telephone Encounter (Signed)
Pt calling back for lab results. NT does not have permission to give.

## 2020-05-24 ENCOUNTER — Other Ambulatory Visit: Payer: Self-pay

## 2020-05-24 ENCOUNTER — Other Ambulatory Visit
Admission: RE | Admit: 2020-05-24 | Discharge: 2020-05-24 | Disposition: A | Discharge status: Home / Self Care | Payer: Medicare PPO | Source: Ambulatory Visit | Attending: Psychiatry | Admitting: Psychiatry

## 2020-05-24 DIAGNOSIS — Z01812 Encounter for preprocedural laboratory examination: Secondary | ICD-10-CM | POA: Insufficient documentation

## 2020-05-24 DIAGNOSIS — Z20822 Contact with and (suspected) exposure to covid-19: Secondary | ICD-10-CM | POA: Diagnosis not present

## 2020-05-24 LAB — SARS CORONAVIRUS 2 (TAT 6-24 HRS): SARS Coronavirus 2: NEGATIVE

## 2020-05-25 ENCOUNTER — Other Ambulatory Visit: Payer: Self-pay | Admitting: Psychiatry

## 2020-05-26 ENCOUNTER — Other Ambulatory Visit: Payer: Self-pay

## 2020-05-26 ENCOUNTER — Encounter: Payer: Self-pay | Admitting: Anesthesiology

## 2020-05-26 ENCOUNTER — Ambulatory Visit: Payer: Self-pay | Admitting: Anesthesiology

## 2020-05-26 ENCOUNTER — Encounter (HOSPITAL_BASED_OUTPATIENT_CLINIC_OR_DEPARTMENT_OTHER)
Admission: RE | Admit: 2020-05-26 | Discharge: 2020-05-26 | Disposition: A | Discharge status: Home / Self Care | Payer: Medicare PPO | Source: Ambulatory Visit | Attending: Psychiatry | Admitting: Psychiatry

## 2020-05-26 DIAGNOSIS — Z7984 Long term (current) use of oral hypoglycemic drugs: Secondary | ICD-10-CM | POA: Diagnosis not present

## 2020-05-26 DIAGNOSIS — Z6839 Body mass index (BMI) 39.0-39.9, adult: Secondary | ICD-10-CM | POA: Diagnosis not present

## 2020-05-26 DIAGNOSIS — F332 Major depressive disorder, recurrent severe without psychotic features: Secondary | ICD-10-CM | POA: Diagnosis not present

## 2020-05-26 DIAGNOSIS — F339 Major depressive disorder, recurrent, unspecified: Secondary | ICD-10-CM | POA: Diagnosis not present

## 2020-05-26 DIAGNOSIS — E1142 Type 2 diabetes mellitus with diabetic polyneuropathy: Secondary | ICD-10-CM | POA: Diagnosis not present

## 2020-05-26 DIAGNOSIS — K219 Gastro-esophageal reflux disease without esophagitis: Secondary | ICD-10-CM | POA: Diagnosis not present

## 2020-05-26 DIAGNOSIS — F609 Personality disorder, unspecified: Secondary | ICD-10-CM | POA: Diagnosis not present

## 2020-05-26 DIAGNOSIS — E7800 Pure hypercholesterolemia, unspecified: Secondary | ICD-10-CM | POA: Diagnosis not present

## 2020-05-26 DIAGNOSIS — I10 Essential (primary) hypertension: Secondary | ICD-10-CM | POA: Diagnosis not present

## 2020-05-26 DIAGNOSIS — Z79899 Other long term (current) drug therapy: Secondary | ICD-10-CM | POA: Diagnosis not present

## 2020-05-26 LAB — GLUCOSE, CAPILLARY: Glucose-Capillary: 118 mg/dL — ABNORMAL HIGH (ref 70–99)

## 2020-05-26 MED ORDER — LABETALOL HCL 5 MG/ML IV SOLN
INTRAVENOUS | Status: AC
  Filled 2020-05-26: qty 4

## 2020-05-26 MED ORDER — SODIUM CHLORIDE 0.9 % IV SOLN
500.0000 mL | Freq: Once | INTRAVENOUS | Status: AC
  Administered 2020-05-26: 500 mL via INTRAVENOUS

## 2020-05-26 MED ORDER — ESMOLOL HCL 100 MG/10ML IV SOLN
INTRAVENOUS | Status: AC
  Filled 2020-05-26: qty 10

## 2020-05-26 MED ORDER — KETOROLAC TROMETHAMINE 30 MG/ML IJ SOLN: 30.0000 mg | Freq: Once | INTRAMUSCULAR | Status: AC

## 2020-05-26 MED ORDER — SODIUM CHLORIDE 0.9 % IV SOLN: INTRAVENOUS | Status: DC | PRN

## 2020-05-26 MED ORDER — LABETALOL HCL 5 MG/ML IV SOLN
INTRAVENOUS | Status: DC | PRN
  Administered 2020-05-26: 30 mg via INTRAVENOUS

## 2020-05-26 MED ORDER — PROPOFOL 10 MG/ML IV BOLUS
INTRAVENOUS | Status: DC | PRN
  Administered 2020-05-26: 30 mg via INTRAVENOUS

## 2020-05-26 MED ORDER — ESMOLOL HCL 100 MG/10ML IV SOLN
INTRAVENOUS | Status: DC | PRN
  Administered 2020-05-26: 20 mg via INTRAVENOUS

## 2020-05-26 MED ORDER — SUCCINYLCHOLINE CHLORIDE 200 MG/10ML IV SOSY
PREFILLED_SYRINGE | INTRAVENOUS | Status: AC
  Filled 2020-05-26: qty 10

## 2020-05-26 MED ORDER — KETAMINE HCL 10 MG/ML IJ SOLN
INTRAMUSCULAR | Status: DC | PRN
  Administered 2020-05-26: 100 mg via INTRAVENOUS

## 2020-05-26 MED ORDER — GLYCOPYRROLATE 0.2 MG/ML IJ SOLN
INTRAMUSCULAR | Status: AC
  Administered 2020-05-26: 0.4 mg via INTRAVENOUS
  Filled 2020-05-26: qty 2

## 2020-05-26 MED ORDER — SUCCINYLCHOLINE CHLORIDE 20 MG/ML IJ SOLN
INTRAMUSCULAR | Status: DC | PRN
  Administered 2020-05-26: 100 mg via INTRAVENOUS

## 2020-05-26 MED ORDER — KETOROLAC TROMETHAMINE 30 MG/ML IJ SOLN
INTRAMUSCULAR | Status: AC
  Administered 2020-05-26: 30 mg via INTRAVENOUS
  Filled 2020-05-26: qty 1

## 2020-05-26 MED ORDER — GLYCOPYRROLATE 0.2 MG/ML IJ SOLN: 0.4000 mg | Freq: Once | INTRAMUSCULAR | Status: AC

## 2020-05-26 NOTE — Transfer of Care (Signed)
Immediate Anesthesia Transfer of Care Note  Patient: Melinda [name]  Procedure(s) Performed: ECT TX  Patient Location: PACU  Anesthesia Type:General  Level of Consciousness: drowsy  Airway & Oxygen Therapy: Patient Spontanous Breathing    Post-op Assessment: Report given to RN and Post -op Vital signs reviewed and stable  Post vital signs: Reviewed and stable  Last Vitals:  Vitals Value Taken Time  BP 158/94 05/26/20 1111  Temp 36.9 C 05/26/20 1110  Pulse 92 05/26/20 1111  Resp 19 05/26/20 1111  SpO2 100 % 05/26/20 1111    Last Pain:  Vitals:   05/26/20 0824  TempSrc: Oral         Complications: No complications documented.

## 2020-05-26 NOTE — Anesthesia Preprocedure Evaluation (Signed)
Anesthesia Evaluation  Patient identified by MRN, date of birth, ID band Patient awake    Reviewed: Allergy & Precautions, H&P , NPO status , Patient's Chart, lab work & pertinent test results  Airway Mallampati: III       Dental  (+) Chipped   Pulmonary sleep apnea , neg recent URI, Not current smoker,           Cardiovascular hypertension, Pt. on medications (-) angina(-) Past MI      Neuro/Psych PSYCHIATRIC DISORDERS Depression Bipolar Disorder Peripheral neuropathy    GI/Hepatic Neg liver ROS, GERD  Controlled,  Endo/Other  diabetes, Type 2, Oral Hypoglycemic AgentsMorbid obesity  Renal/GU negative Renal ROS  negative genitourinary   Musculoskeletal   Abdominal   Peds  Hematology negative hematology ROS (+)   Anesthesia Other Findings   Reproductive/Obstetrics negative OB ROS                                                              Anesthesia Evaluation  Patient identified by MRN, date of birth, ID band Patient awake    Reviewed: Allergy & Precautions, H&P , NPO status , Patient's Chart, lab work & pertinent test results  History of Anesthesia Complications Negative for: history of anesthetic complications  Airway Mallampati: II  TM Distance: >3 FB Neck ROM: full    Dental  (+) Chipped   Pulmonary sleep apnea , neg COPD, neg recent URI,           Cardiovascular hypertension, (-) angina     Neuro/Psych PSYCHIATRIC DISORDERS Depression Bipolar Disorder negative neurological ROS     GI/Hepatic Neg liver ROS, GERD  Controlled,  Endo/Other  diabetesMorbid obesity  Renal/GU negative Renal ROS  negative genitourinary   Musculoskeletal   Abdominal   Peds  Hematology negative hematology ROS (+)   Anesthesia Other Findings Past Medical History: No date: Depression No date: Diabetes mellitus without complication (HCC) 03/07/15: Diabetic peripheral  neuropathy (HCC) 03/07/15: Diabetic peripheral neuropathy (HCC) 03/07/15: Diabetic peripheral neuropathy (HCC) No date: GERD (gastroesophageal reflux disease) 03/07/15: Hypercholesterolemia No date: Hypertension 03/07/15: Obesity 03/07/15: Personality disorder (HCC) 03/07/15: Sinus tachycardia     Comment:  history of 03/07/15: Suicidal thoughts  Past Surgical History: 12/01/2018: COLONOSCOPY WITH PROPOFOL; N/A     Comment:  Procedure: COLONOSCOPY WITH PROPOFOL;  Surgeon: Anna,               Kiran, MD;  Location: [facility] ENDOS[facility];  Service:               Gastroenterology;  Laterality: N/A; 03/07/15: electroconvulsion therapy  BMI    Body Mass Index: 44.52 kg/m      Reproductive/Obstetrics negative OB ROS                             Anesthesia Physical  Anesthesia Plan  ASA: III  Anesthesia Plan: General   Post-op Pain Management:    Induction:   PONV Risk Score and Plan:   Airway Management Planned: Mask  Additional Equipment:   Intra-op Plan:   Post-operative Plan:   Informed Consent: I have reviewed the patients History and Physical, chart, labs and discussed the procedure including the risks, benefits and alternatives for the proposed anesthesia with the   patient or authorized representative who has indicated his/her understanding and acceptance.     Dental Advisory Given  Plan Discussed with: Anesthesiologist and CRNA  Anesthesia Plan Comments:         Anesthesia Quick Evaluation                                   Anesthesia Evaluation  Patient identified by MRN, date of birth, ID band Patient awake    Reviewed: Allergy & Precautions, H&P , NPO status , Patient's Chart, lab work & pertinent test results  History of Anesthesia Complications Negative for: history of anesthetic complications  Airway Mallampati: II  TM Distance: >3 FB Neck ROM: full    Dental  (+) Chipped   Pulmonary sleep apnea , neg COPD, neg recent  URI,           Cardiovascular hypertension, (-) angina     Neuro/Psych PSYCHIATRIC DISORDERS Depression Bipolar Disorder negative neurological ROS     GI/Hepatic Neg liver ROS, GERD  Controlled,  Endo/Other  diabetesMorbid obesity  Renal/GU negative Renal ROS  negative genitourinary   Musculoskeletal   Abdominal   Peds  Hematology negative hematology ROS (+)   Anesthesia Other Findings Past Medical History: No date: Depression No date: Diabetes mellitus without complication (HCC) 03/07/15: Diabetic peripheral neuropathy (HCC) 03/07/15: Diabetic peripheral neuropathy (HCC) 03/07/15: Diabetic peripheral neuropathy (HCC) No date: GERD (gastroesophageal reflux disease) 03/07/15: Hypercholesterolemia No date: Hypertension 03/07/15: Obesity 03/07/15: Personality disorder (HCC) 03/07/15: Sinus tachycardia     Comment:  history of 03/07/15: Suicidal thoughts  Past Surgical History: 12/01/2018: COLONOSCOPY WITH PROPOFOL; N/A     Comment:  Procedure: COLONOSCOPY WITH PROPOFOL;  Surgeon: Anna,               Kiran, MD;  Location: [facility] END[facility];  Service:               Gastroenterology;  Laterality: N/A; 03/07/15: electroconvulsion therapy  BMI    Body Mass Index: 44.52 kg/m      Reproductive/Obstetrics negative OB ROS                             Anesthesia Physical  Anesthesia Plan  ASA: III  Anesthesia Plan: General   Post-op Pain Management:    Induction:   PONV Risk Score and Plan:   Airway Management Planned: Mask  Additional Equipment:   Intra-op Plan:   Post-operative Plan:   Informed Consent: I have reviewed the patients History and Physical, chart, labs and discussed the procedure including the risks, benefits and alternatives for the proposed anesthesia with the patient or authorized representative who has indicated his/her understanding and acceptance.     Dental Advisory Given  Plan Discussed with:  Anesthesiologist and CRNA  Anesthesia Plan Comments:         Anesthesia Quick Evaluation  Anesthesia Physical  Anesthesia Plan  ASA: II  Anesthesia Plan: General   Post-op Pain Management:    Induction:   PONV Risk Score and Plan:   Airway Management Planned: Mask  Additional Equipment:   Intra-op Plan:   Post-operative Plan:   Informed Consent: I have reviewed the patients History and Physical, chart, labs and discussed the procedure including the risks, benefits and alternatives for the proposed anesthesia with the patient or authorized representative who has indicated his/her understanding and acceptance.     Dental   Advisory Given  Plan Discussed with: Anesthesiologist, CRNA and Surgeon  Anesthesia Plan Comments:         Anesthesia Quick Evaluation

## 2020-05-26 NOTE — Anesthesia Postprocedure Evaluation (Signed)
Anesthesia Post Note  Patient: Melinda [name]  Procedure(s) Performed: ECT TX  Patient location during evaluation: PACU Anesthesia Type: General Level of consciousness: awake and alert Pain management: pain level controlled Vital Signs Assessment: post-procedure vital signs reviewed and stable Respiratory status: spontaneous breathing and respiratory function stable Cardiovascular status: stable Anesthetic complications: no   No complications documented.   Last Vitals:  Vitals:   05/26/20 1110 05/26/20 1111  BP: (!) 158/94 (!) 158/94  Pulse: 91 92  Resp: 14 19  Temp: 36.9 C   SpO2: 99% 100%    Last Pain:  Vitals:   05/26/20 1110  TempSrc:   PainSc: Asleep                 Eisa Necaise K

## 2020-05-26 NOTE — Procedures (Signed)
ECT SERVICES Physician's Interval Evaluation & Treatment Note  Patient Identification: Melinda [name] MRN:  [id number] Date of Evaluation:  05/26/2020 TX #: [id number]  MADRS:   MMSE:   P.E. Findings:  No change to physical exam  Psychiatric Interval Note:  Feeling a little more down and tearful today circumstantial things bothering her.  No psychosis.  Subjective:  Patient is a 49 y.o. female seen for evaluation for Electroconvulsive Therapy. Feeling a little more down today  Treatment Summary:   []   Right Unilateral             [x]  Bilateral   % Energy : 1.0 ms 35%   Impedance: 1190 ohms  Seizure Energy Index: 7612 V squared  Postictal Suppression Index: 91%  Seizure Concordance Index: 87%  Medications  Pre Shock: Robinul 0.4 mg Toradol 30 mg labetalol 20 mg esmolol 20 mg ketamine 100 mg propofol 30 mg succinylcholine 100 mg  Post Shock:    Seizure Duration: 31 seconds EMG 69 seconds EEG   Comments: Follow-up 3 weeks  Lungs:  [x]   Clear to auscultation               []  Other:   Heart:    [x]   Regular rhythm             []  irregular rhythm    [x]   Previous H&P reviewed, patient examined and there are NO CHANGES                 []   Previous H&P reviewed, patient examined and there are changes noted.   [name], MD 7/16/202110:55 AM

## 2020-05-26 NOTE — H&P (Signed)
Melinda [name] is an 49 y.o. female.   Chief Complaint: Feeling more sad and emotional today.  Some tearfulness HPI: History of recurrent severe depression  Past Medical History:  Diagnosis Date  . Depression   . Diabetes mellitus without complication ([place])   . Diabetic peripheral neuropathy ([place]) 03/07/15  . Diabetic peripheral neuropathy ([place]) 03/07/15  . Diabetic peripheral neuropathy ([place]) 03/07/15  . GERD (gastroesophageal reflux disease)   . Hypercholesterolemia 03/07/15  . Hypertension   . Obesity 03/07/15  . Personality disorder ([place]) 03/07/15  . Sinus tachycardia 03/07/15   history of  . Suicidal thoughts 03/07/15    Past Surgical History:  Procedure Laterality Date  . COLONOSCOPY WITH PROPOFOL N/A 12/01/2018   Procedure: COLONOSCOPY WITH PROPOFOL;  Surgeon: Jonathon Bellows, MD;  Location: [facility] ENDOSCOPY;  Service: Gastroenterology;  Laterality: N/A;  . electroconvulsion therapy  03/07/15    Family History  Problem Relation Age of Onset  . Hypertension Father   . Diabetes Mother    Social History:  reports that she has never smoked. She has never used smokeless tobacco. She reports that she does not drink alcohol and does not use drugs.  Allergies:  Allergies  Allergen Reactions  . Prednisone     Increases blood sugar    (Not in a hospital admission)   Results for orders placed or performed during the hospital encounter of 05/26/20 (from the past 48 hour(s))  Glucose, capillary     Status: Abnormal   Collection Time: 05/26/20  8:28 AM  Result Value Ref Range   Glucose-Capillary 118 (H) 70 - 99 mg/dL    Comment: Glucose reference range applies only to samples taken after fasting for at least 8 hours.   No results found.  Review of Systems  Constitutional: Negative.   HENT: Negative.   Eyes: Negative.   Respiratory: Negative.   Cardiovascular: Negative.   Gastrointestinal: Negative.   Musculoskeletal: Negative.   Skin: Negative.   Neurological: Negative.    Psychiatric/Behavioral: Positive for dysphoric mood. Negative for hallucinations, self-injury and suicidal ideas. The patient is not hyperactive.     Blood pressure (!) 171/95, pulse 95, temperature 98.5 F (36.9 C), temperature source Oral, resp. rate 16, height 5\' 4"  (1.626 m), weight 103.6 kg, SpO2 100 %. Physical Exam Vitals and nursing note reviewed.  Constitutional:      Appearance: She is well-developed.  HENT:     Head: Normocephalic and atraumatic.  Eyes:     Conjunctiva/sclera: Conjunctivae normal.     Pupils: Pupils are equal, round, and reactive to light.  Cardiovascular:     Heart sounds: Normal heart sounds.  Pulmonary:     Effort: Pulmonary effort is normal.  Abdominal:     Palpations: Abdomen is soft.  Musculoskeletal:        General: Normal range of motion.     Cervical back: Normal range of motion.  Skin:    General: Skin is warm and dry.  Neurological:     General: No focal deficit present.     Mental Status: She is alert.  Psychiatric:        Attention and Perception: Attention normal.        Mood and Affect: Mood is depressed. Affect is tearful.        Speech: Speech normal.        Behavior: Behavior is cooperative.        Thought Content: Thought content normal.        Cognition and  Memory: Cognition normal.        Judgment: Judgment normal.      Assessment/Plan Treatment today and because of circumstances follow-up 3 weeks.  Supportive counseling and therapy to the patient.  Reassess treatment schedule next time we speak.  [name], MD 05/26/2020, 10:53 AM

## 2020-06-14 ENCOUNTER — Other Ambulatory Visit
Admission: RE | Admit: 2020-06-14 | Discharge: 2020-06-14 | Disposition: A | Discharge status: Home / Self Care | Payer: Medicare PPO | Source: Ambulatory Visit | Attending: Psychiatry | Admitting: Psychiatry

## 2020-06-14 ENCOUNTER — Other Ambulatory Visit: Payer: Self-pay

## 2020-06-14 DIAGNOSIS — Z01812 Encounter for preprocedural laboratory examination: Secondary | ICD-10-CM | POA: Insufficient documentation

## 2020-06-14 DIAGNOSIS — Z20822 Contact with and (suspected) exposure to covid-19: Secondary | ICD-10-CM | POA: Diagnosis not present

## 2020-06-14 LAB — SARS CORONAVIRUS 2 (TAT 6-24 HRS): SARS Coronavirus 2: NEGATIVE

## 2020-06-15 ENCOUNTER — Other Ambulatory Visit: Payer: Self-pay | Admitting: Psychiatry

## 2020-06-16 ENCOUNTER — Encounter: Payer: Self-pay | Admitting: Certified Registered"

## 2020-06-16 ENCOUNTER — Other Ambulatory Visit: Payer: Self-pay

## 2020-06-16 ENCOUNTER — Encounter
Admission: RE | Admit: 2020-06-16 | Discharge: 2020-06-16 | Disposition: A | Discharge status: Home / Self Care | Payer: Medicare PPO | Source: Ambulatory Visit | Attending: Psychiatry | Admitting: Psychiatry

## 2020-06-16 DIAGNOSIS — E669 Obesity, unspecified: Secondary | ICD-10-CM | POA: Diagnosis not present

## 2020-06-16 DIAGNOSIS — E1142 Type 2 diabetes mellitus with diabetic polyneuropathy: Secondary | ICD-10-CM | POA: Diagnosis not present

## 2020-06-16 DIAGNOSIS — E7800 Pure hypercholesterolemia, unspecified: Secondary | ICD-10-CM | POA: Diagnosis not present

## 2020-06-16 DIAGNOSIS — Z888 Allergy status to other drugs, medicaments and biological substances status: Secondary | ICD-10-CM | POA: Diagnosis not present

## 2020-06-16 DIAGNOSIS — Z833 Family history of diabetes mellitus: Secondary | ICD-10-CM | POA: Insufficient documentation

## 2020-06-16 DIAGNOSIS — Z8249 Family history of ischemic heart disease and other diseases of the circulatory system: Secondary | ICD-10-CM | POA: Insufficient documentation

## 2020-06-16 DIAGNOSIS — F329 Major depressive disorder, single episode, unspecified: Secondary | ICD-10-CM | POA: Insufficient documentation

## 2020-06-16 DIAGNOSIS — F332 Major depressive disorder, recurrent severe without psychotic features: Secondary | ICD-10-CM

## 2020-06-16 DIAGNOSIS — I10 Essential (primary) hypertension: Secondary | ICD-10-CM | POA: Diagnosis not present

## 2020-06-16 DIAGNOSIS — F609 Personality disorder, unspecified: Secondary | ICD-10-CM | POA: Diagnosis not present

## 2020-06-16 DIAGNOSIS — Z6839 Body mass index (BMI) 39.0-39.9, adult: Secondary | ICD-10-CM | POA: Diagnosis not present

## 2020-06-16 DIAGNOSIS — Z79899 Other long term (current) drug therapy: Secondary | ICD-10-CM | POA: Insufficient documentation

## 2020-06-16 DIAGNOSIS — K219 Gastro-esophageal reflux disease without esophagitis: Secondary | ICD-10-CM | POA: Diagnosis not present

## 2020-06-16 DIAGNOSIS — Z7984 Long term (current) use of oral hypoglycemic drugs: Secondary | ICD-10-CM | POA: Diagnosis not present

## 2020-06-16 LAB — GLUCOSE, CAPILLARY: Glucose-Capillary: 111 mg/dL — ABNORMAL HIGH (ref 70–99)

## 2020-06-16 MED ORDER — SODIUM CHLORIDE 0.9 % IV SOLN
500.0000 mL | Freq: Once | INTRAVENOUS | Status: AC
  Administered 2020-06-16: 500 mL via INTRAVENOUS

## 2020-06-16 MED ORDER — PROPOFOL 500 MG/50ML IV EMUL
INTRAVENOUS | Status: DC | PRN
  Administered 2020-06-16: 30 mg via INTRAVENOUS

## 2020-06-16 MED ORDER — GLYCOPYRROLATE 0.2 MG/ML IJ SOLN
INTRAMUSCULAR | Status: AC
  Filled 2020-06-16: qty 2

## 2020-06-16 MED ORDER — SODIUM CHLORIDE 0.9 % IV SOLN: INTRAVENOUS | Status: DC | PRN

## 2020-06-16 MED ORDER — KETOROLAC TROMETHAMINE 30 MG/ML IJ SOLN
INTRAMUSCULAR | Status: AC
  Filled 2020-06-16: qty 1

## 2020-06-16 MED ORDER — SUCCINYLCHOLINE CHLORIDE 20 MG/ML IJ SOLN
INTRAMUSCULAR | Status: DC | PRN
  Administered 2020-06-16: 100 mg via INTRAVENOUS

## 2020-06-16 MED ORDER — KETAMINE HCL 10 MG/ML IJ SOLN
INTRAMUSCULAR | Status: DC | PRN
  Administered 2020-06-16: 70 mg via INTRAVENOUS

## 2020-06-16 MED ORDER — KETOROLAC TROMETHAMINE 30 MG/ML IJ SOLN
30.0000 mg | Freq: Once | INTRAMUSCULAR | Status: AC
  Administered 2020-06-16: 30 mg via INTRAVENOUS

## 2020-06-16 MED ORDER — ONDANSETRON HCL 4 MG/2ML IJ SOLN
INTRAMUSCULAR | Status: AC
  Filled 2020-06-16: qty 2

## 2020-06-16 MED ORDER — ONDANSETRON HCL 4 MG/2ML IJ SOLN
4.0000 mg | Freq: Once | INTRAMUSCULAR | Status: AC
  Administered 2020-06-16: 4 mg via INTRAVENOUS

## 2020-06-16 MED ORDER — GLYCOPYRROLATE 0.2 MG/ML IJ SOLN
0.4000 mg | Freq: Once | INTRAMUSCULAR | Status: AC
  Administered 2020-06-16: 0.4 mg via INTRAVENOUS

## 2020-06-16 MED ORDER — PROMETHAZINE HCL 25 MG/ML IJ SOLN: 6.2500 mg | INTRAMUSCULAR | Status: DC | PRN

## 2020-06-16 MED ORDER — DEXAMETHASONE SODIUM PHOSPHATE 10 MG/ML IJ SOLN
INTRAMUSCULAR | Status: DC | PRN
Start: 2020-06-16 — End: 2020-06-16
  Administered 2020-06-16: 10 mg via INTRAVENOUS

## 2020-06-16 NOTE — Anesthesia Postprocedure Evaluation (Signed)
Anesthesia Post Note  Patient: Melinda [name]  Procedure(s) Performed: ECT TX  Patient location during evaluation: PACU Anesthesia Type: General Level of consciousness: awake and alert Pain management: pain level controlled Vital Signs Assessment: post-procedure vital signs reviewed and stable Respiratory status: spontaneous breathing, nonlabored ventilation and respiratory function stable Cardiovascular status: blood pressure returned to baseline and stable Postop Assessment: no signs of nausea or vomiting Anesthetic complications: no   No complications documented.   Last Vitals:  Vitals:   06/16/20 1137 06/16/20 1139  BP:    Pulse: (!) 125 (!) 118  Resp: (!) 22   Temp: 36.6 C   SpO2: 97% 93%    Last Pain:  Vitals:   06/16/20 1137  TempSrc:   PainSc: 0-No pain                 Mercades Bajaj

## 2020-06-16 NOTE — Procedures (Signed)
ECT SERVICES Physician's Interval Evaluation & Treatment Note  Patient Identification: Melinda [name] MRN:  [id number] Date of Evaluation:  06/16/2020 TX #: [id number]  MADRS:   MMSE:   P.E. Findings:  Normal physical exam  Psychiatric Interval Note:  Chronic dysphoria no change from baseline  Subjective:  Patient is a 49 y.o. female seen for evaluation for Electroconvulsive Therapy. Dysphoria depression anxiety no suicidal thoughts  Treatment Summary:   []   Right Unilateral             [x]  Bilateral   % Energy : 1.0 ms 35%   Impedance: 880 ohms  Seizure Energy Index: 8975 V squared  Postictal Suppression Index: 93%  Seizure Concordance Index: 94%  Medications  Pre Shock: Robinul 0.4 mg, Toradol 30 mg, Zofran 4 mg, labetalol 20 mg, esmolol 20 mg, ketamine 70 mg, propofol 30 mg, succinylcholine 100 mg  Post Shock: 29 seconds EMG 58 seconds EEG  Seizure Duration: Follow-up 3 weeks   Comments: Encouragement and supportive counseling.  Encourage patient to use her time to be more active.  Lungs:  [x]   Clear to auscultation               []  Other:   Heart:    [x]   Regular rhythm             []  irregular rhythm    [x]   Previous H&P reviewed, patient examined and there are NO CHANGES                 []   Previous H&P reviewed, patient examined and there are changes noted.   [name], MD 8/6/20213:53 PM

## 2020-06-16 NOTE — Anesthesia Preprocedure Evaluation (Signed)
Anesthesia Evaluation  Patient identified by MRN, date of birth, ID band Patient awake    Reviewed: Allergy & Precautions, H&P , NPO status , Patient's Chart, lab work & pertinent test results  History of Anesthesia Complications Negative for: history of anesthetic complications  Airway Mallampati: II  TM Distance: >3 FB Neck ROM: full    Dental  (+) Poor Dentition, Chipped   Pulmonary sleep apnea , neg COPD,    Pulmonary exam normal breath sounds clear to auscultation       Cardiovascular hypertension, Pt. on medications (-) CAD and (-) Past MI negative cardio ROS Normal cardiovascular exam Rhythm:regular Rate:Normal     Neuro/Psych PSYCHIATRIC DISORDERS Depression Bipolar Disorder  Neuromuscular disease negative neurological ROS     GI/Hepatic negative GI ROS, Neg liver ROS, GERD  Medicated,  Endo/Other  diabetes, Type 2, Oral Hypoglycemic Agents  Renal/GU negative Renal ROS  negative genitourinary   Musculoskeletal   Abdominal (+) + obese,   Peds  Hematology negative hematology ROS (+)   Anesthesia Other Findings Past Medical History: No date: Depression No date: Diabetes mellitus without complication (HCC) 2/56/38: Diabetic peripheral neuropathy ([place]) 03/07/15: Diabetic peripheral neuropathy ([place]) 03/07/15: Diabetic peripheral neuropathy (HCC) No date: GERD (gastroesophageal reflux disease) 03/07/15: Hypercholesterolemia No date: Hypertension 03/07/15: Obesity 03/07/15: Personality disorder 03/07/15: Sinus tachycardia ([place])     Comment: history of 03/07/15: Suicidal thoughts Past Surgical History: 03/07/15: electroconvulsion therapy BMI    Body Mass Index:  36.44 kg/m     Reproductive/Obstetrics                             Anesthesia Physical  Anesthesia Plan  ASA: III  Anesthesia Plan: General   Post-op Pain Management:    Induction: Intravenous  PONV Risk Score and  Plan: 2 and Ondansetron  Airway Management Planned: Mask  Additional Equipment:   Intra-op Plan:   Post-operative Plan:   Informed Consent: I have reviewed the patients History and Physical, chart, labs and discussed the procedure including the risks, benefits and alternatives for the proposed anesthesia with the patient or authorized representative who has indicated his/her understanding and acceptance.     Dental Advisory Given  Plan Discussed with: CRNA and Anesthesiologist  Anesthesia Plan Comments:         Anesthesia Quick Evaluation

## 2020-06-16 NOTE — Transfer of Care (Signed)
Immediate Anesthesia Transfer of Care Note  Patient: Melinda [name]  Procedure(s) Performed: ECT TX  Patient Location: PACU  Anesthesia Type:General  Level of Consciousness: awake  Airway & Oxygen Therapy: Patient Spontanous Breathing and Patient connected to face mask oxygen  Post-op Assessment: Report given to RN and Post -op Vital signs reviewed and stable  Post vital signs: Reviewed  Last Vitals:  Vitals Value Taken Time  BP    Temp    Pulse 140 06/16/20 1057  Resp 19 06/16/20 1057  SpO2 96 % 06/16/20 1057  Vitals shown include unvalidated device data.  Last Pain:  Vitals:   06/16/20 0815  TempSrc: Oral  PainSc: 0-No pain         Complications: No complications documented.

## 2020-06-16 NOTE — H&P (Signed)
Melinda [name] is an 49 y.o. female.   Chief Complaint: chronic depresion HPI: chronic dysphoria without psychosis  Past Medical History:  Diagnosis Date  . Depression   . Diabetes mellitus without complication ([place])   . Diabetic peripheral neuropathy ([place]) 03/07/15  . Diabetic peripheral neuropathy ([place]) 03/07/15  . Diabetic peripheral neuropathy ([place]) 03/07/15  . GERD (gastroesophageal reflux disease)   . Hypercholesterolemia 03/07/15  . Hypertension   . Obesity 03/07/15  . Personality disorder ([place]) 03/07/15  . Sinus tachycardia 03/07/15   history of  . Suicidal thoughts 03/07/15    Past Surgical History:  Procedure Laterality Date  . COLONOSCOPY WITH PROPOFOL N/A 12/01/2018   Procedure: COLONOSCOPY WITH PROPOFOL;  Surgeon: Jonathon Bellows, MD;  Location: [facility] ENDOSCOPY;  Service: Gastroenterology;  Laterality: N/A;  . electroconvulsion therapy  03/07/15    Family History  Problem Relation Age of Onset  . Hypertension Father   . Diabetes Mother    Social History:  reports that she has never smoked. She has never used smokeless tobacco. She reports that she does not drink alcohol and does not use drugs.  Allergies:  Allergies  Allergen Reactions  . Prednisone     Increases blood sugar    (Not in a hospital admission)   Results for orders placed or performed during the hospital encounter of 06/16/20 (from the past 48 hour(s))  Glucose, capillary     Status: Abnormal   Collection Time: 06/16/20  8:13 AM  Result Value Ref Range   Glucose-Capillary 111 (H) 70 - 99 mg/dL    Comment: Glucose reference range applies only to samples taken after fasting for at least 8 hours.   No results found.  Review of Systems  Constitutional: Negative.   HENT: Negative.   Eyes: Negative.   Respiratory: Negative.   Cardiovascular: Negative.   Gastrointestinal: Negative.   Musculoskeletal: Negative.   Skin: Negative.   Neurological: Negative.   Psychiatric/Behavioral: Negative.      Blood pressure (!) 145/90, pulse 85, temperature 97.8 F (36.6 C), temperature source Oral, resp. rate 18, height 5\' 4"  (1.626 m), weight 103.6 kg, SpO2 96 %. Physical Exam Vitals and nursing note reviewed.  Constitutional:      Appearance: She is well-developed.  HENT:     Head: Normocephalic and atraumatic.  Eyes:     Conjunctiva/sclera: Conjunctivae normal.     Pupils: Pupils are equal, round, and reactive to light.  Cardiovascular:     Heart sounds: Normal heart sounds.  Pulmonary:     Effort: Pulmonary effort is normal.  Abdominal:     Palpations: Abdomen is soft.  Musculoskeletal:        General: Normal range of motion.     Cervical back: Normal range of motion.  Skin:    General: Skin is warm and dry.  Neurological:     General: No focal deficit present.     Mental Status: She is alert.  Psychiatric:        Attention and Perception: Attention normal.        Mood and Affect: Mood is depressed.        Speech: Speech normal.        Behavior: Behavior normal.        Thought Content: Thought content normal.        Cognition and Memory: Cognition normal.        Judgment: Judgment normal.      Assessment/Plan Try to continue q3 week  [name],  MD 06/16/2020, 9:58 AM

## 2020-06-28 ENCOUNTER — Other Ambulatory Visit: Payer: Self-pay | Admitting: Physician Assistant

## 2020-06-28 DIAGNOSIS — I10 Essential (primary) hypertension: Secondary | ICD-10-CM

## 2020-06-28 NOTE — Telephone Encounter (Signed)
Requested Prescriptions  Pending Prescriptions Disp Refills  . lisinopril (ZESTRIL) 10 MG tablet [Pharmacy Med Name: LISINOPRIL 10 MG Tablet] 90 tablet 1    Sig: TAKE 1 TABLET (10 MG TOTAL) BY MOUTH DAILY.     Cardiovascular:  ACE Inhibitors Passed - 06/28/2020  9:12 PM      Passed - Cr in normal range and within 180 days    Creatinine  Date Value Ref Range Status  05/08/2013 0.77 0.60 - 1.30 mg/dL Final   Creatinine, Ser  Date Value Ref Range Status  05/16/2020 0.86 0.57 - 1.00 mg/dL Final         Passed - K in normal range and within 180 days    Potassium  Date Value Ref Range Status  05/16/2020 3.9 3.5 - 5.2 mmol/L Final  05/08/2013 3.8 3.5 - 5.1 mmol/L Final         Passed - Patient is not pregnant      Passed - Last BP in normal range    BP Readings from Last 1 Encounters:  05/16/20 128/86         Passed - Valid encounter within last 6 months    Recent Outpatient Visits          1 month ago Controlled type 2 diabetes mellitus without complication, without long-term current use of insulin [facility])   [place], Adriana M, PA-C   5 months ago Controlled type 2 diabetes mellitus without complication, without long-term current use of insulin [facility])   [facility] [place], Adriana M, PA-C   9 months ago Type 2 diabetes mellitus with diabetic polyneuropathy, without long-term current use of insulin [facility])   [facility] [place], [place], [place]   1 year ago Type 2 diabetes mellitus with diabetic polyneuropathy, without long-term current use of insulin [facility])   [facility] [place], [place], [place]   1 year ago Right arm pain   [facility] [place], [place]      Future Appointments            In 4 months [name], Wendee Beavers, PA-C [employer]

## 2020-07-05 ENCOUNTER — Other Ambulatory Visit: Payer: Self-pay

## 2020-07-05 ENCOUNTER — Other Ambulatory Visit
Admission: RE | Admit: 2020-07-05 | Discharge: 2020-07-05 | Disposition: A | Discharge status: Home / Self Care | Payer: Medicare PPO | Source: Ambulatory Visit | Attending: Anesthesiology | Admitting: Anesthesiology

## 2020-07-05 DIAGNOSIS — Z20822 Contact with and (suspected) exposure to covid-19: Secondary | ICD-10-CM | POA: Diagnosis not present

## 2020-07-05 DIAGNOSIS — Z01812 Encounter for preprocedural laboratory examination: Secondary | ICD-10-CM | POA: Diagnosis not present

## 2020-07-06 ENCOUNTER — Other Ambulatory Visit: Payer: Self-pay | Admitting: Psychiatry

## 2020-07-06 LAB — SARS CORONAVIRUS 2 (TAT 6-24 HRS): SARS Coronavirus 2: NEGATIVE

## 2020-07-07 ENCOUNTER — Other Ambulatory Visit: Payer: Self-pay

## 2020-07-07 ENCOUNTER — Encounter (HOSPITAL_BASED_OUTPATIENT_CLINIC_OR_DEPARTMENT_OTHER)
Admission: RE | Admit: 2020-07-07 | Discharge: 2020-07-07 | Disposition: A | Discharge status: Home / Self Care | Payer: Medicare PPO | Source: Ambulatory Visit | Attending: Psychiatry | Admitting: Psychiatry

## 2020-07-07 ENCOUNTER — Encounter: Payer: Self-pay | Admitting: Registered Nurse

## 2020-07-07 DIAGNOSIS — F609 Personality disorder, unspecified: Secondary | ICD-10-CM | POA: Diagnosis not present

## 2020-07-07 DIAGNOSIS — I10 Essential (primary) hypertension: Secondary | ICD-10-CM | POA: Diagnosis not present

## 2020-07-07 DIAGNOSIS — Z6839 Body mass index (BMI) 39.0-39.9, adult: Secondary | ICD-10-CM | POA: Diagnosis not present

## 2020-07-07 DIAGNOSIS — E7800 Pure hypercholesterolemia, unspecified: Secondary | ICD-10-CM | POA: Diagnosis not present

## 2020-07-07 DIAGNOSIS — F332 Major depressive disorder, recurrent severe without psychotic features: Secondary | ICD-10-CM | POA: Diagnosis not present

## 2020-07-07 DIAGNOSIS — F329 Major depressive disorder, single episode, unspecified: Secondary | ICD-10-CM | POA: Diagnosis not present

## 2020-07-07 DIAGNOSIS — K219 Gastro-esophageal reflux disease without esophagitis: Secondary | ICD-10-CM | POA: Diagnosis not present

## 2020-07-07 DIAGNOSIS — E669 Obesity, unspecified: Secondary | ICD-10-CM | POA: Diagnosis not present

## 2020-07-07 DIAGNOSIS — E1142 Type 2 diabetes mellitus with diabetic polyneuropathy: Secondary | ICD-10-CM | POA: Diagnosis not present

## 2020-07-07 DIAGNOSIS — Z7984 Long term (current) use of oral hypoglycemic drugs: Secondary | ICD-10-CM | POA: Diagnosis not present

## 2020-07-07 LAB — GLUCOSE, CAPILLARY
Glucose-Capillary: 111 mg/dL — ABNORMAL HIGH (ref 70–99)
Glucose-Capillary: 146 mg/dL — ABNORMAL HIGH (ref 70–99)

## 2020-07-07 MED ORDER — SUCCINYLCHOLINE CHLORIDE 20 MG/ML IJ SOLN
INTRAMUSCULAR | Status: DC | PRN
  Administered 2020-07-07: 100 mg via INTRAVENOUS

## 2020-07-07 MED ORDER — LABETALOL HCL 5 MG/ML IV SOLN
INTRAVENOUS | Status: AC
  Filled 2020-07-07: qty 4

## 2020-07-07 MED ORDER — PROPOFOL 10 MG/ML IV BOLUS
INTRAVENOUS | Status: DC | PRN
  Administered 2020-07-07: 30 mg via INTRAVENOUS

## 2020-07-07 MED ORDER — KETOROLAC TROMETHAMINE 30 MG/ML IJ SOLN: 30.0000 mg | Freq: Once | INTRAMUSCULAR | Status: DC

## 2020-07-07 MED ORDER — SODIUM CHLORIDE 0.9 % IV SOLN: INTRAVENOUS | Status: DC | PRN

## 2020-07-07 MED ORDER — GLYCOPYRROLATE 0.2 MG/ML IJ SOLN
INTRAMUSCULAR | Status: AC
  Administered 2020-07-07: 0.4 mg via INTRAVENOUS
  Filled 2020-07-07: qty 2

## 2020-07-07 MED ORDER — GLYCOPYRROLATE 0.2 MG/ML IJ SOLN: 0.4000 mg | Freq: Once | INTRAMUSCULAR | Status: AC

## 2020-07-07 MED ORDER — KETOROLAC TROMETHAMINE 30 MG/ML IJ SOLN
INTRAMUSCULAR | Status: AC
  Filled 2020-07-07: qty 1

## 2020-07-07 MED ORDER — LABETALOL HCL 5 MG/ML IV SOLN
INTRAVENOUS | Status: DC | PRN
  Administered 2020-07-07: 10 mg via INTRAVENOUS
  Administered 2020-07-07: 20 mg via INTRAVENOUS

## 2020-07-07 MED ORDER — ONDANSETRON HCL 4 MG/2ML IJ SOLN: 4.0000 mg | Freq: Once | INTRAMUSCULAR | Status: DC | PRN

## 2020-07-07 MED ORDER — SODIUM CHLORIDE 0.9 % IV SOLN
500.0000 mL | Freq: Once | INTRAVENOUS | Status: AC
  Administered 2020-07-07: 500 mL via INTRAVENOUS

## 2020-07-07 MED ORDER — KETAMINE HCL 10 MG/ML IJ SOLN
INTRAMUSCULAR | Status: DC | PRN
  Administered 2020-07-07: 100 mg via INTRAVENOUS

## 2020-07-07 NOTE — H&P (Signed)
Melinda [name] is an 49 y.o. female.   Chief Complaint: No specific complaint.  Chronic mild dysphoria HPI: History of depression with good response to ECT  Past Medical History:  Diagnosis Date  . Depression   . Diabetes mellitus without complication ([place])   . Diabetic peripheral neuropathy ([place]) 03/07/15  . Diabetic peripheral neuropathy ([place]) 03/07/15  . Diabetic peripheral neuropathy ([place]) 03/07/15  . GERD (gastroesophageal reflux disease)   . Hypercholesterolemia 03/07/15  . Hypertension   . Obesity 03/07/15  . Personality disorder ([place]) 03/07/15  . Sinus tachycardia 03/07/15   history of  . Suicidal thoughts 03/07/15    Past Surgical History:  Procedure Laterality Date  . COLONOSCOPY WITH PROPOFOL N/A 12/01/2018   Procedure: COLONOSCOPY WITH PROPOFOL;  Surgeon: Jonathon Bellows, MD;  Location: [facility] ENDOSCOPY;  Service: Gastroenterology;  Laterality: N/A;  . electroconvulsion therapy  03/07/15    Family History  Problem Relation Age of Onset  . Hypertension Father   . Diabetes Mother    Social History:  reports that she has never smoked. She has never used smokeless tobacco. She reports that she does not drink alcohol and does not use drugs.  Allergies:  Allergies  Allergen Reactions  . Prednisone     Increases blood sugar    (Not in a hospital admission)   Results for orders placed or performed during the hospital encounter of 07/07/20 (from the past 48 hour(s))  Glucose, capillary     Status: Abnormal   Collection Time: 07/07/20  8:42 AM  Result Value Ref Range   Glucose-Capillary 111 (H) 70 - 99 mg/dL    Comment: Glucose reference range applies only to samples taken after fasting for at least 8 hours.  Glucose, capillary     Status: Abnormal   Collection Time: 07/07/20 11:23 AM  Result Value Ref Range   Glucose-Capillary 146 (H) 70 - 99 mg/dL    Comment: Glucose reference range applies only to samples taken after fasting for at least 8 hours.   No results  found.  Review of Systems  Constitutional: Negative.   HENT: Negative.   Eyes: Negative.   Respiratory: Negative.   Cardiovascular: Negative.   Gastrointestinal: Negative.   Musculoskeletal: Negative.   Skin: Negative.   Neurological: Negative.   Psychiatric/Behavioral: Negative.     Blood pressure (!) 131/103, pulse 89, temperature 98.1 F (36.7 C), resp. rate (!) 23, height 5\' 4"  (1.626 m), weight 103.6 kg, SpO2 95 %. Physical Exam Vitals and nursing note reviewed.  Constitutional:      Appearance: She is well-developed.  HENT:     Head: Normocephalic and atraumatic.  Eyes:     Conjunctiva/sclera: Conjunctivae normal.     Pupils: Pupils are equal, round, and reactive to light.  Cardiovascular:     Heart sounds: Normal heart sounds.  Pulmonary:     Effort: Pulmonary effort is normal.  Abdominal:     Palpations: Abdomen is soft.  Musculoskeletal:        General: Normal range of motion.     Cervical back: Normal range of motion.  Skin:    General: Skin is warm and dry.  Neurological:     General: No focal deficit present.     Mental Status: She is alert.  Psychiatric:        Mood and Affect: Mood normal.        Behavior: Behavior normal.        Thought Content: Thought content normal.  Judgment: Judgment normal.      Assessment/Plan Treatment today follow-up 3 weeks  [name], MD 07/07/2020, 6:15 PM

## 2020-07-07 NOTE — Transfer of Care (Signed)
Immediate Anesthesia Transfer of Care Note  Patient: Melinda [name]  Procedure(s) Performed: ECT TX  Patient Location: PACU  Anesthesia Type:General  Level of Consciousness: drowsy  Airway & Oxygen Therapy: Patient Spontanous Breathing and Patient connected to face mask oxygen  Post-op Assessment: Report given to RN and Post -op Vital signs reviewed and stable  Post vital signs: Reviewed and stable  Last Vitals:  Vitals Value Taken Time  BP    Temp    Pulse    Resp    SpO2      Last Pain:  Vitals:   07/07/20 0910  TempSrc: Oral  PainSc: 0-No pain         Complications: No complications documented.

## 2020-07-07 NOTE — Anesthesia Postprocedure Evaluation (Signed)
Anesthesia Post Note  Patient: Melinda [name]  Procedure(s) Performed: ECT TX  Patient location during evaluation: PACU Anesthesia Type: General Level of consciousness: awake and alert Pain management: pain level controlled Vital Signs Assessment: post-procedure vital signs reviewed and stable Respiratory status: spontaneous breathing and respiratory function stable Cardiovascular status: stable Anesthetic complications: no   No complications documented.   Last Vitals:  Vitals:   07/07/20 1135 07/07/20 1150  BP: (!) 145/100 (!) 131/103  Pulse: 90 89  Resp: (!) 22 (!) 23  Temp:    SpO2: 94% 95%    Last Pain:  Vitals:   07/07/20 1150  TempSrc:   PainSc: 0-No pain                 Dennise Raabe K

## 2020-07-07 NOTE — Anesthesia Preprocedure Evaluation (Signed)
Anesthesia Evaluation  Patient identified by MRN, date of birth, ID band Patient awake    Reviewed: Allergy & Precautions, H&P , NPO status , Patient's Chart, lab work & pertinent test results  Airway Mallampati: III       Dental  (+) Chipped   Pulmonary sleep apnea , neg recent URI, Not current smoker,           Cardiovascular hypertension, Pt. on medications (-) angina(-) Past MI      Neuro/Psych PSYCHIATRIC DISORDERS Depression Bipolar Disorder Peripheral neuropathy    GI/Hepatic Neg liver ROS, GERD  Controlled,  Endo/Other  diabetes, Type 2, Oral Hypoglycemic AgentsMorbid obesity  Renal/GU negative Renal ROS  negative genitourinary   Musculoskeletal   Abdominal   Peds  Hematology negative hematology ROS (+)   Anesthesia Other Findings   Reproductive/Obstetrics negative OB ROS                                                              Anesthesia Evaluation  Patient identified by MRN, date of birth, ID band Patient awake    Reviewed: Allergy & Precautions, H&P , NPO status , Patient's Chart, lab work & pertinent test results  History of Anesthesia Complications Negative for: history of anesthetic complications  Airway Mallampati: II  TM Distance: >3 FB Neck ROM: full    Dental  (+) Chipped   Pulmonary sleep apnea , neg COPD, neg recent URI,           Cardiovascular hypertension, (-) angina     Neuro/Psych PSYCHIATRIC DISORDERS Depression Bipolar Disorder negative neurological ROS     GI/Hepatic Neg liver ROS, GERD  Controlled,  Endo/Other  diabetesMorbid obesity  Renal/GU negative Renal ROS  negative genitourinary   Musculoskeletal   Abdominal   Peds  Hematology negative hematology ROS (+)   Anesthesia Other Findings Past Medical History: No date: Depression No date: Diabetes mellitus without complication ([place]) 2/53/66: Diabetic peripheral  neuropathy ([place]) 03/07/15: Diabetic peripheral neuropathy ([place]) 03/07/15: Diabetic peripheral neuropathy ([facility]) No date: GERD (gastroesophageal reflux disease) 03/07/15: Hypercholesterolemia No date: Hypertension 03/07/15: Obesity 03/07/15: Personality disorder ([place]) 03/07/15: Sinus tachycardia     Comment:  history of 03/07/15: Suicidal thoughts  Past Surgical History: 12/01/2018: COLONOSCOPY WITH PROPOFOL; N/A     Comment:  Procedure: COLONOSCOPY WITH PROPOFOL;  Surgeon: Jonathon Bellows, MD;  Location: [facility] ENDOSCOPY;  Service:               Gastroenterology;  Laterality: N/A; 03/07/15: electroconvulsion therapy  BMI    Body Mass Index: 44.52 kg/m      Reproductive/Obstetrics negative OB ROS                             Anesthesia Physical  Anesthesia Plan  ASA: III  Anesthesia Plan: General   Post-op Pain Management:    Induction:   PONV Risk Score and Plan:   Airway Management Planned: Mask  Additional Equipment:   Intra-op Plan:   Post-operative Plan:   Informed Consent: I have reviewed the patients History and Physical, chart, labs and discussed the procedure including the risks, benefits and alternatives for the proposed anesthesia with the  patient or authorized representative who has indicated his/her understanding and acceptance.     Dental Advisory Given  Plan Discussed with: Anesthesiologist and CRNA  Anesthesia Plan Comments:         Anesthesia Quick Evaluation                                   Anesthesia Evaluation  Patient identified by MRN, date of birth, ID band Patient awake    Reviewed: Allergy & Precautions, H&P , NPO status , Patient's Chart, lab work & pertinent test results  History of Anesthesia Complications Negative for: history of anesthetic complications  Airway Mallampati: II  TM Distance: >3 FB Neck ROM: full    Dental  (+) Chipped   Pulmonary sleep apnea , neg COPD, neg recent  URI,           Cardiovascular hypertension, (-) angina     Neuro/Psych PSYCHIATRIC DISORDERS Depression Bipolar Disorder negative neurological ROS     GI/Hepatic Neg liver ROS, GERD  Controlled,  Endo/Other  diabetesMorbid obesity  Renal/GU negative Renal ROS  negative genitourinary   Musculoskeletal   Abdominal   Peds  Hematology negative hematology ROS (+)   Anesthesia Other Findings Past Medical History: No date: Depression No date: Diabetes mellitus without complication ([place]) 9/32/67: Diabetic peripheral neuropathy ([place]) 03/07/15: Diabetic peripheral neuropathy ([place]) 03/07/15: Diabetic peripheral neuropathy ([facility]) No date: GERD (gastroesophageal reflux disease) 03/07/15: Hypercholesterolemia No date: Hypertension 03/07/15: Obesity 03/07/15: Personality disorder ([place]) 03/07/15: Sinus tachycardia     Comment:  history of 03/07/15: Suicidal thoughts  Past Surgical History: 12/01/2018: COLONOSCOPY WITH PROPOFOL; N/A     Comment:  Procedure: COLONOSCOPY WITH PROPOFOL;  Surgeon: Jonathon Bellows, MD;  Location: [facility] ENDOSCOPY;  Service:               Gastroenterology;  Laterality: N/A; 03/07/15: electroconvulsion therapy  BMI    Body Mass Index: 44.52 kg/m      Reproductive/Obstetrics negative OB ROS                             Anesthesia Physical  Anesthesia Plan  ASA: III  Anesthesia Plan: General   Post-op Pain Management:    Induction:   PONV Risk Score and Plan:   Airway Management Planned: Mask  Additional Equipment:   Intra-op Plan:   Post-operative Plan:   Informed Consent: I have reviewed the patients History and Physical, chart, labs and discussed the procedure including the risks, benefits and alternatives for the proposed anesthesia with the patient or authorized representative who has indicated his/her understanding and acceptance.     Dental Advisory Given  Plan Discussed with:  Anesthesiologist and CRNA  Anesthesia Plan Comments:         Anesthesia Quick Evaluation  Anesthesia Physical  Anesthesia Plan  ASA: II  Anesthesia Plan: General   Post-op Pain Management:    Induction:   PONV Risk Score and Plan:   Airway Management Planned: Mask  Additional Equipment:   Intra-op Plan:   Post-operative Plan:   Informed Consent: I have reviewed the patients History and Physical, chart, labs and discussed the procedure including the risks, benefits and alternatives for the proposed anesthesia with the patient or authorized representative who has indicated his/her understanding and acceptance.     Dental  Advisory Given  Plan Discussed with: Anesthesiologist, CRNA and Surgeon  Anesthesia Plan Comments:         Anesthesia Quick Evaluation

## 2020-07-07 NOTE — Procedures (Signed)
ECT SERVICES Physician's Interval Evaluation & Treatment Note  Patient Identification: Melinda [name] MRN:  [id number] Date of Evaluation:  07/07/2020 TX #: [id number]  MADRS:   MMSE:   P.E. Findings:  No change to physical exam  Psychiatric Interval Note:  Mood stable  Subjective:  Patient is a 49 y.o. female seen for evaluation for Electroconvulsive Therapy. No complaint  Treatment Summary:   []   Right Unilateral             [x]  Bilateral   % Energy : 1.0 ms 35%   Impedance: 760 ohms  Seizure Energy Index: 9295 V squared  Postictal Suppression Index: 12%  Seizure Concordance Index: 97%  Medications  Pre Shock: Robinul 0.4 mg Toradol 30 mg Zofran 4 mg labetalol 20 mg esmolol 20 mg ketamine 70 mg propofol 30 mg succinylcholine 100 mg  Post Shock:    Seizure Duration: 34 seconds EMG 86 seconds EEG   Comments: Follow-up 3 weeks  Lungs:  [x]   Clear to auscultation               []  Other:   Heart:    [x]   Regular rhythm             []  irregular rhythm    [x]   Previous H&P reviewed, patient examined and there are NO CHANGES                 []   Previous H&P reviewed, patient examined and there are changes noted.   [name], MD 8/27/20216:16 PM

## 2020-07-26 ENCOUNTER — Other Ambulatory Visit
Admission: RE | Admit: 2020-07-26 | Discharge: 2020-07-26 | Disposition: A | Discharge status: Home / Self Care | Payer: Medicare PPO | Source: Ambulatory Visit | Attending: Psychiatry | Admitting: Psychiatry

## 2020-07-26 ENCOUNTER — Other Ambulatory Visit: Payer: Self-pay | Admitting: Physician Assistant

## 2020-07-26 ENCOUNTER — Other Ambulatory Visit: Payer: Self-pay

## 2020-07-26 DIAGNOSIS — Z20822 Contact with and (suspected) exposure to covid-19: Secondary | ICD-10-CM | POA: Insufficient documentation

## 2020-07-26 DIAGNOSIS — Z01812 Encounter for preprocedural laboratory examination: Secondary | ICD-10-CM | POA: Diagnosis not present

## 2020-07-27 ENCOUNTER — Other Ambulatory Visit: Payer: Self-pay | Admitting: Psychiatry

## 2020-07-27 LAB — SARS CORONAVIRUS 2 (TAT 6-24 HRS): SARS Coronavirus 2: NEGATIVE

## 2020-07-28 ENCOUNTER — Encounter
Admission: RE | Admit: 2020-07-28 | Discharge: 2020-07-28 | Disposition: A | Discharge status: Home / Self Care | Payer: Medicare PPO | Source: Ambulatory Visit | Attending: Psychiatry | Admitting: Psychiatry

## 2020-07-28 ENCOUNTER — Other Ambulatory Visit: Payer: Self-pay

## 2020-07-28 ENCOUNTER — Encounter: Payer: Self-pay | Admitting: Anesthesiology

## 2020-07-28 ENCOUNTER — Ambulatory Visit: Payer: Self-pay | Admitting: Anesthesiology

## 2020-07-28 DIAGNOSIS — E669 Obesity, unspecified: Secondary | ICD-10-CM | POA: Diagnosis not present

## 2020-07-28 DIAGNOSIS — F329 Major depressive disorder, single episode, unspecified: Secondary | ICD-10-CM | POA: Insufficient documentation

## 2020-07-28 DIAGNOSIS — F609 Personality disorder, unspecified: Secondary | ICD-10-CM | POA: Diagnosis not present

## 2020-07-28 DIAGNOSIS — Z8249 Family history of ischemic heart disease and other diseases of the circulatory system: Secondary | ICD-10-CM | POA: Insufficient documentation

## 2020-07-28 DIAGNOSIS — Z833 Family history of diabetes mellitus: Secondary | ICD-10-CM | POA: Insufficient documentation

## 2020-07-28 DIAGNOSIS — E7800 Pure hypercholesterolemia, unspecified: Secondary | ICD-10-CM | POA: Insufficient documentation

## 2020-07-28 DIAGNOSIS — E1142 Type 2 diabetes mellitus with diabetic polyneuropathy: Secondary | ICD-10-CM | POA: Diagnosis not present

## 2020-07-28 DIAGNOSIS — I10 Essential (primary) hypertension: Secondary | ICD-10-CM | POA: Insufficient documentation

## 2020-07-28 DIAGNOSIS — Z79899 Other long term (current) drug therapy: Secondary | ICD-10-CM | POA: Insufficient documentation

## 2020-07-28 DIAGNOSIS — Z7984 Long term (current) use of oral hypoglycemic drugs: Secondary | ICD-10-CM | POA: Insufficient documentation

## 2020-07-28 DIAGNOSIS — Z6839 Body mass index (BMI) 39.0-39.9, adult: Secondary | ICD-10-CM | POA: Diagnosis not present

## 2020-07-28 DIAGNOSIS — K219 Gastro-esophageal reflux disease without esophagitis: Secondary | ICD-10-CM | POA: Diagnosis not present

## 2020-07-28 DIAGNOSIS — Z888 Allergy status to other drugs, medicaments and biological substances status: Secondary | ICD-10-CM | POA: Insufficient documentation

## 2020-07-28 DIAGNOSIS — F332 Major depressive disorder, recurrent severe without psychotic features: Secondary | ICD-10-CM | POA: Diagnosis not present

## 2020-07-28 LAB — GLUCOSE, CAPILLARY
Glucose-Capillary: 116 mg/dL — ABNORMAL HIGH (ref 70–99)
Glucose-Capillary: 132 mg/dL — ABNORMAL HIGH (ref 70–99)

## 2020-07-28 MED ORDER — GLYCOPYRROLATE 0.2 MG/ML IJ SOLN
0.4000 mg | Freq: Once | INTRAMUSCULAR | Status: AC
  Administered 2020-07-28: 0.4 mg via INTRAVENOUS

## 2020-07-28 MED ORDER — KETOROLAC TROMETHAMINE 30 MG/ML IJ SOLN
INTRAMUSCULAR | Status: AC
  Administered 2020-07-28: 30 mg via INTRAVENOUS
  Filled 2020-07-28: qty 1

## 2020-07-28 MED ORDER — GLYCOPYRROLATE 0.2 MG/ML IJ SOLN
INTRAMUSCULAR | Status: AC
  Filled 2020-07-28: qty 2

## 2020-07-28 MED ORDER — SUCCINYLCHOLINE CHLORIDE 200 MG/10ML IV SOSY
PREFILLED_SYRINGE | INTRAVENOUS | Status: AC
  Filled 2020-07-28: qty 20

## 2020-07-28 MED ORDER — SODIUM CHLORIDE 0.9 % IV SOLN
500.0000 mL | Freq: Once | INTRAVENOUS | Status: AC
  Administered 2020-07-28: 500 mL via INTRAVENOUS

## 2020-07-28 MED ORDER — SUCCINYLCHOLINE CHLORIDE 200 MG/10ML IV SOSY
PREFILLED_SYRINGE | INTRAVENOUS | Status: AC
  Filled 2020-07-28: qty 10

## 2020-07-28 MED ORDER — LABETALOL HCL 5 MG/ML IV SOLN
INTRAVENOUS | Status: DC | PRN
  Administered 2020-07-28: 20 mg via INTRAVENOUS
  Administered 2020-07-28: 10 mg via INTRAVENOUS

## 2020-07-28 MED ORDER — KETOROLAC TROMETHAMINE 30 MG/ML IJ SOLN: 30.0000 mg | Freq: Once | INTRAMUSCULAR | Status: AC

## 2020-07-28 MED ORDER — LABETALOL HCL 5 MG/ML IV SOLN
INTRAVENOUS | Status: AC
  Filled 2020-07-28: qty 8

## 2020-07-28 MED ORDER — KETAMINE HCL 10 MG/ML IJ SOLN
INTRAMUSCULAR | Status: DC | PRN
  Administered 2020-07-28: 100 mg via INTRAVENOUS

## 2020-07-28 MED ORDER — KETAMINE HCL 50 MG/ML IJ SOLN
INTRAMUSCULAR | Status: AC
  Filled 2020-07-28: qty 10

## 2020-07-28 MED ORDER — PROPOFOL 10 MG/ML IV BOLUS
INTRAVENOUS | Status: AC
  Filled 2020-07-28: qty 20

## 2020-07-28 MED ORDER — ESMOLOL HCL 100 MG/10ML IV SOLN
INTRAVENOUS | Status: AC
  Filled 2020-07-28: qty 10

## 2020-07-28 MED ORDER — PROPOFOL 10 MG/ML IV BOLUS
INTRAVENOUS | Status: DC | PRN
  Administered 2020-07-28: 30 mg via INTRAVENOUS

## 2020-07-28 MED ORDER — ESMOLOL HCL 100 MG/10ML IV SOLN
INTRAVENOUS | Status: DC | PRN
  Administered 2020-07-28: 20 mg via INTRAVENOUS

## 2020-07-28 MED ORDER — SUCCINYLCHOLINE CHLORIDE 20 MG/ML IJ SOLN
INTRAMUSCULAR | Status: DC | PRN
  Administered 2020-07-28: 100 mg via INTRAVENOUS

## 2020-07-28 NOTE — H&P (Signed)
Melinda [name] is an 49 y.o. female.   Chief Complaint: anxiety and depression. Scratching self this week HPI: recurrent severe depression  Past Medical History:  Diagnosis Date  . Depression   . Diabetes mellitus without complication ([place])   . Diabetic peripheral neuropathy ([place]) 03/07/15  . Diabetic peripheral neuropathy ([place]) 03/07/15  . Diabetic peripheral neuropathy ([place]) 03/07/15  . GERD (gastroesophageal reflux disease)   . Hypercholesterolemia 03/07/15  . Hypertension   . Obesity 03/07/15  . Personality disorder ([place]) 03/07/15  . Sinus tachycardia 03/07/15   history of  . Suicidal thoughts 03/07/15    Past Surgical History:  Procedure Laterality Date  . COLONOSCOPY WITH PROPOFOL N/A 12/01/2018   Procedure: COLONOSCOPY WITH PROPOFOL;  Surgeon: Jonathon Bellows, MD;  Location: [facility] ENDOSCOPY;  Service: Gastroenterology;  Laterality: N/A;  . electroconvulsion therapy  03/07/15    Family History  Problem Relation Age of Onset  . Hypertension Father   . Diabetes Mother    Social History:  reports that she has never smoked. She has never used smokeless tobacco. She reports that she does not drink alcohol and does not use drugs.  Allergies:  Allergies  Allergen Reactions  . Prednisone     Increases blood sugar    (Not in a hospital admission)   Results for orders placed or performed during the hospital encounter of 07/28/20 (from the past 48 hour(s))  Glucose, capillary     Status: Abnormal   Collection Time: 07/28/20  9:05 AM  Result Value Ref Range   Glucose-Capillary 116 (H) 70 - 99 mg/dL    Comment: Glucose reference range applies only to samples taken after fasting for at least 8 hours.   No results found.  Review of Systems  Constitutional: Negative.   HENT: Negative.   Eyes: Negative.   Respiratory: Negative.   Cardiovascular: Negative.   Gastrointestinal: Negative.   Musculoskeletal: Negative.   Skin: Negative.   Neurological: Negative.    Psychiatric/Behavioral: Negative.     Blood pressure 138/76, pulse (!) 101, temperature 98 F (36.7 C), temperature source Oral, resp. rate 20, height 5\' 4"  (1.626 m), SpO2 99 %. Physical Exam Vitals and nursing note reviewed.  Constitutional:      Appearance: She is well-developed.  HENT:     Head: Normocephalic and atraumatic.  Eyes:     Conjunctiva/sclera: Conjunctivae normal.     Pupils: Pupils are equal, round, and reactive to light.  Cardiovascular:     Heart sounds: Normal heart sounds.  Pulmonary:     Effort: Pulmonary effort is normal.  Abdominal:     Palpations: Abdomen is soft.  Musculoskeletal:        General: Normal range of motion.     Cervical back: Normal range of motion.  Skin:    General: Skin is warm and dry.  Neurological:     General: No focal deficit present.     Mental Status: She is alert.  Psychiatric:        Mood and Affect: Mood normal.        Thought Content: Thought content normal.      Assessment/Plan Continue q3 week treatment  [name], MD 07/28/2020, 9:50 AM

## 2020-07-28 NOTE — Transfer of Care (Signed)
Immediate Anesthesia Transfer of Care Note  Patient: Melinda [name]  Procedure(s) Performed: ECT TX  Patient Location: PACU  Anesthesia Type:General  Level of Consciousness: awake  Airway & Oxygen Therapy: Patient Spontanous Breathing and Patient connected to face mask oxygen  Post-op Assessment: Report given to RN and Post -op Vital signs reviewed and stable  Post vital signs: Reviewed  Last Vitals:  Vitals Value Taken Time  BP    Temp    Pulse    Resp    SpO2      Last Pain:  Vitals:   07/28/20 0928  TempSrc:   PainSc: 0-No pain         Complications: No complications documented.

## 2020-07-28 NOTE — Procedures (Signed)
ECT SERVICES Physician's Interval Evaluation & Treatment Note  Patient Identification: Melinda [name] MRN:  [id number] Date of Evaluation:  07/28/2020 TX #: [id number]  MADRS:   MMSE:   P.E. Findings:  No change to physical exam  Psychiatric Interval Note:  Chronic depression and anxiety  Subjective:  Patient is a 49 y.o. female seen for evaluation for Electroconvulsive Therapy. Nervous this week  Treatment Summary:   []   Right Unilateral             [x]  Bilateral   % Energy : 1.0 ms 35%   Impedance: 760 ohms  Seizure Energy Index: 8174 V squared  Postictal Suppression Index: 86%  Seizure Concordance Index: 94%  Medications  Pre Shock: Robinul 0.4 mg Toradol 30 mg Zofran 4 mg labetalol 30 mg esmolol 20 mg ketamine 70 mg propofol 30 mg succinylcholine 100 mg  Post Shock:    Seizure Duration: 28 seconds EMG 63 seconds EEG   Comments: Good treatment.  Return for next treatment 3 weeks  Lungs:  [x]   Clear to auscultation               []  Other:   Heart:    [x]   Regular rhythm             []  irregular rhythm    [x]   Previous H&P reviewed, patient examined and there are NO CHANGES                 []   Previous H&P reviewed, patient examined and there are changes noted.   [name], MD 9/17/20216:28 PM

## 2020-07-28 NOTE — Anesthesia Preprocedure Evaluation (Signed)
Anesthesia Evaluation  Patient identified by MRN, date of birth, ID band Patient awake    Reviewed: Allergy & Precautions, H&P , NPO status , Patient's Chart, lab work & pertinent test results  Airway Mallampati: III       Dental  (+) Chipped   Pulmonary sleep apnea , neg recent URI, Not current smoker,           Cardiovascular hypertension, Pt. on medications (-) angina(-) Past MI      Neuro/Psych PSYCHIATRIC DISORDERS Depression Bipolar Disorder Peripheral neuropathy    GI/Hepatic Neg liver ROS, GERD  Controlled,  Endo/Other  diabetes, Type 2, Oral Hypoglycemic AgentsMorbid obesity  Renal/GU negative Renal ROS  negative genitourinary   Musculoskeletal   Abdominal   Peds  Hematology negative hematology ROS (+)   Anesthesia Other Findings   Reproductive/Obstetrics negative OB ROS                                                              Anesthesia Evaluation  Patient identified by MRN, date of birth, ID band Patient awake    Reviewed: Allergy & Precautions, H&P , NPO status , Patient's Chart, lab work & pertinent test results  History of Anesthesia Complications Negative for: history of anesthetic complications  Airway Mallampati: II  TM Distance: >3 FB Neck ROM: full    Dental  (+) Chipped   Pulmonary sleep apnea , neg COPD, neg recent URI,           Cardiovascular hypertension, (-) angina     Neuro/Psych PSYCHIATRIC DISORDERS Depression Bipolar Disorder negative neurological ROS     GI/Hepatic Neg liver ROS, GERD  Controlled,  Endo/Other  diabetesMorbid obesity  Renal/GU negative Renal ROS  negative genitourinary   Musculoskeletal   Abdominal   Peds  Hematology negative hematology ROS (+)   Anesthesia Other Findings Past Medical History: No date: Depression No date: Diabetes mellitus without complication ([place]) 1/66/06: Diabetic peripheral  neuropathy ([place]) 03/07/15: Diabetic peripheral neuropathy ([place]) 03/07/15: Diabetic peripheral neuropathy ([facility]) No date: GERD (gastroesophageal reflux disease) 03/07/15: Hypercholesterolemia No date: Hypertension 03/07/15: Obesity 03/07/15: Personality disorder ([place]) 03/07/15: Sinus tachycardia     Comment:  history of 03/07/15: Suicidal thoughts  Past Surgical History: 12/01/2018: COLONOSCOPY WITH PROPOFOL; N/A     Comment:  Procedure: COLONOSCOPY WITH PROPOFOL;  Surgeon: Jonathon Bellows, MD;  Location: [facility] ENDOSCOPY;  Service:               Gastroenterology;  Laterality: N/A; 03/07/15: electroconvulsion therapy  BMI    Body Mass Index: 44.52 kg/m      Reproductive/Obstetrics negative OB ROS                             Anesthesia Physical  Anesthesia Plan  ASA: III  Anesthesia Plan: General   Post-op Pain Management:    Induction:   PONV Risk Score and Plan:   Airway Management Planned: Mask  Additional Equipment:   Intra-op Plan:   Post-operative Plan:   Informed Consent: I have reviewed the patients History and Physical, chart, labs and discussed the procedure including the risks, benefits and alternatives for the proposed anesthesia with the  patient or authorized representative who has indicated his/her understanding and acceptance.     Dental Advisory Given  Plan Discussed with: Anesthesiologist and CRNA  Anesthesia Plan Comments:         Anesthesia Quick Evaluation                                   Anesthesia Evaluation  Patient identified by MRN, date of birth, ID band Patient awake    Reviewed: Allergy & Precautions, H&P , NPO status , Patient's Chart, lab work & pertinent test results  History of Anesthesia Complications Negative for: history of anesthetic complications  Airway Mallampati: II  TM Distance: >3 FB Neck ROM: full    Dental  (+) Chipped   Pulmonary sleep apnea , neg COPD, neg recent  URI,           Cardiovascular hypertension, (-) angina     Neuro/Psych PSYCHIATRIC DISORDERS Depression Bipolar Disorder negative neurological ROS     GI/Hepatic Neg liver ROS, GERD  Controlled,  Endo/Other  diabetesMorbid obesity  Renal/GU negative Renal ROS  negative genitourinary   Musculoskeletal   Abdominal   Peds  Hematology negative hematology ROS (+)   Anesthesia Other Findings Past Medical History: No date: Depression No date: Diabetes mellitus without complication ([place]) 01/23/96: Diabetic peripheral neuropathy ([place]) 03/07/15: Diabetic peripheral neuropathy ([place]) 03/07/15: Diabetic peripheral neuropathy ([facility]) No date: GERD (gastroesophageal reflux disease) 03/07/15: Hypercholesterolemia No date: Hypertension 03/07/15: Obesity 03/07/15: Personality disorder ([place]) 03/07/15: Sinus tachycardia     Comment:  history of 03/07/15: Suicidal thoughts  Past Surgical History: 12/01/2018: COLONOSCOPY WITH PROPOFOL; N/A     Comment:  Procedure: COLONOSCOPY WITH PROPOFOL;  Surgeon: Jonathon Bellows, MD;  Location: [facility] ENDOSCOPY;  Service:               Gastroenterology;  Laterality: N/A; 03/07/15: electroconvulsion therapy  BMI    Body Mass Index: 44.52 kg/m      Reproductive/Obstetrics negative OB ROS                             Anesthesia Physical  Anesthesia Plan  ASA: III  Anesthesia Plan: General   Post-op Pain Management:    Induction:   PONV Risk Score and Plan:   Airway Management Planned: Mask  Additional Equipment:   Intra-op Plan:   Post-operative Plan:   Informed Consent: I have reviewed the patients History and Physical, chart, labs and discussed the procedure including the risks, benefits and alternatives for the proposed anesthesia with the patient or authorized representative who has indicated his/her understanding and acceptance.     Dental Advisory Given  Plan Discussed with:  Anesthesiologist and CRNA  Anesthesia Plan Comments:         Anesthesia Quick Evaluation  Anesthesia Physical  Anesthesia Plan  ASA: II  Anesthesia Plan: General   Post-op Pain Management:    Induction:   PONV Risk Score and Plan:   Airway Management Planned: Mask  Additional Equipment:   Intra-op Plan:   Post-operative Plan:   Informed Consent: I have reviewed the patients History and Physical, chart, labs and discussed the procedure including the risks, benefits and alternatives for the proposed anesthesia with the patient or authorized representative who has indicated his/her understanding and acceptance.     Dental  Advisory Given  Plan Discussed with: Anesthesiologist, CRNA and Surgeon  Anesthesia Plan Comments:         Anesthesia Quick Evaluation

## 2020-07-30 NOTE — Anesthesia Postprocedure Evaluation (Signed)
Anesthesia Post Note  Patient: Melinda [name]  Procedure(s) Performed: ECT TX  Patient location during evaluation: PACU Anesthesia Type: General Level of consciousness: awake and alert Pain management: pain level controlled Vital Signs Assessment: post-procedure vital signs reviewed and stable Respiratory status: spontaneous breathing, nonlabored ventilation, respiratory function stable and patient connected to nasal cannula oxygen Cardiovascular status: blood pressure returned to baseline and stable Postop Assessment: no apparent nausea or vomiting Anesthetic complications: no   No complications documented.   Last Vitals:  Vitals:   07/28/20 1208 07/28/20 1210  BP: 119/71 120/72  Pulse: 90 89  Resp: (!) 30 (!) 25  Temp: 36.6 C 36.6 C  SpO2: 95%     Last Pain:  Vitals:   07/28/20 1210  TempSrc: Oral  PainSc: 0-No pain                 Precious Haws Cythina Mickelsen

## 2020-08-14 ENCOUNTER — Telehealth: Payer: Self-pay

## 2020-08-16 ENCOUNTER — Other Ambulatory Visit
Admission: RE | Admit: 2020-08-16 | Discharge: 2020-08-16 | Disposition: A | Discharge status: Home / Self Care | Payer: Medicare PPO | Source: Ambulatory Visit | Attending: Psychiatry | Admitting: Psychiatry

## 2020-08-16 ENCOUNTER — Other Ambulatory Visit: Payer: Self-pay

## 2020-08-16 ENCOUNTER — Other Ambulatory Visit: Payer: Self-pay | Admitting: Physician Assistant

## 2020-08-16 DIAGNOSIS — Z20822 Contact with and (suspected) exposure to covid-19: Secondary | ICD-10-CM | POA: Diagnosis not present

## 2020-08-16 DIAGNOSIS — Z01818 Encounter for other preprocedural examination: Secondary | ICD-10-CM | POA: Diagnosis not present

## 2020-08-16 DIAGNOSIS — E119 Type 2 diabetes mellitus without complications: Secondary | ICD-10-CM

## 2020-08-16 LAB — SARS CORONAVIRUS 2 (TAT 6-24 HRS): SARS Coronavirus 2: NEGATIVE

## 2020-08-17 ENCOUNTER — Other Ambulatory Visit: Payer: Self-pay | Admitting: Psychiatry

## 2020-08-18 ENCOUNTER — Encounter
Admission: RE | Admit: 2020-08-18 | Discharge: 2020-08-18 | Disposition: A | Discharge status: Home / Self Care | Payer: Medicare PPO | Source: Ambulatory Visit | Attending: Psychiatry | Admitting: Psychiatry

## 2020-08-18 ENCOUNTER — Other Ambulatory Visit: Payer: Self-pay

## 2020-08-18 ENCOUNTER — Encounter: Payer: Self-pay | Admitting: Registered Nurse

## 2020-08-18 ENCOUNTER — Ambulatory Visit: Payer: Self-pay | Admitting: Registered Nurse

## 2020-08-18 DIAGNOSIS — K219 Gastro-esophageal reflux disease without esophagitis: Secondary | ICD-10-CM | POA: Insufficient documentation

## 2020-08-18 DIAGNOSIS — F332 Major depressive disorder, recurrent severe without psychotic features: Secondary | ICD-10-CM

## 2020-08-18 DIAGNOSIS — F609 Personality disorder, unspecified: Secondary | ICD-10-CM | POA: Insufficient documentation

## 2020-08-18 DIAGNOSIS — Z7984 Long term (current) use of oral hypoglycemic drugs: Secondary | ICD-10-CM | POA: Diagnosis not present

## 2020-08-18 DIAGNOSIS — Z888 Allergy status to other drugs, medicaments and biological substances status: Secondary | ICD-10-CM | POA: Diagnosis not present

## 2020-08-18 DIAGNOSIS — E7800 Pure hypercholesterolemia, unspecified: Secondary | ICD-10-CM | POA: Insufficient documentation

## 2020-08-18 DIAGNOSIS — Z79899 Other long term (current) drug therapy: Secondary | ICD-10-CM | POA: Insufficient documentation

## 2020-08-18 DIAGNOSIS — I10 Essential (primary) hypertension: Secondary | ICD-10-CM | POA: Insufficient documentation

## 2020-08-18 DIAGNOSIS — E1142 Type 2 diabetes mellitus with diabetic polyneuropathy: Secondary | ICD-10-CM | POA: Diagnosis not present

## 2020-08-18 DIAGNOSIS — E669 Obesity, unspecified: Secondary | ICD-10-CM | POA: Diagnosis not present

## 2020-08-18 DIAGNOSIS — F329 Major depressive disorder, single episode, unspecified: Secondary | ICD-10-CM | POA: Diagnosis not present

## 2020-08-18 DIAGNOSIS — Z833 Family history of diabetes mellitus: Secondary | ICD-10-CM | POA: Insufficient documentation

## 2020-08-18 DIAGNOSIS — Z6839 Body mass index (BMI) 39.0-39.9, adult: Secondary | ICD-10-CM | POA: Diagnosis not present

## 2020-08-18 DIAGNOSIS — Z8249 Family history of ischemic heart disease and other diseases of the circulatory system: Secondary | ICD-10-CM | POA: Insufficient documentation

## 2020-08-18 LAB — GLUCOSE, CAPILLARY: Glucose-Capillary: 131 mg/dL — ABNORMAL HIGH (ref 70–99)

## 2020-08-18 MED ORDER — SODIUM CHLORIDE 0.9 % IV SOLN: 500.0000 mL | Freq: Once | INTRAVENOUS | Status: DC

## 2020-08-18 MED ORDER — LABETALOL HCL 5 MG/ML IV SOLN
INTRAVENOUS | Status: DC | PRN
  Administered 2020-08-18: 30 mg via INTRAVENOUS

## 2020-08-18 MED ORDER — GLYCOPYRROLATE 0.2 MG/ML IJ SOLN
INTRAMUSCULAR | Status: AC
  Administered 2020-08-18: 0.4 mg via INTRAVENOUS
  Filled 2020-08-18: qty 2

## 2020-08-18 MED ORDER — KETOROLAC TROMETHAMINE 30 MG/ML IJ SOLN
INTRAMUSCULAR | Status: AC
  Administered 2020-08-18: 30 mg via INTRAVENOUS
  Filled 2020-08-18: qty 1

## 2020-08-18 MED ORDER — KETOROLAC TROMETHAMINE 30 MG/ML IJ SOLN: 30.0000 mg | Freq: Once | INTRAMUSCULAR | Status: AC

## 2020-08-18 MED ORDER — METHOHEXITAL SODIUM 100 MG/10ML IV SOSY
PREFILLED_SYRINGE | INTRAVENOUS | Status: DC | PRN
  Administered 2020-08-18: 70 mg via INTRAVENOUS

## 2020-08-18 MED ORDER — GLYCOPYRROLATE 0.2 MG/ML IJ SOLN: 0.4000 mg | Freq: Once | INTRAMUSCULAR | Status: AC

## 2020-08-18 MED ORDER — SUCCINYLCHOLINE CHLORIDE 20 MG/ML IJ SOLN
INTRAMUSCULAR | Status: DC | PRN
  Administered 2020-08-18: 100 mg via INTRAVENOUS

## 2020-08-18 MED ORDER — ESMOLOL HCL 100 MG/10ML IV SOLN
INTRAVENOUS | Status: DC | PRN
  Administered 2020-08-18: 20 mg via INTRAVENOUS

## 2020-08-18 NOTE — H&P (Signed)
Melinda [name] is an 49 y.o. female.   Chief Complaint: No specific chief complaint.  Chronic dysphoria and dissatisfaction HPI: History of chronic recurrent depression  Past Medical History:  Diagnosis Date  . Depression   . Diabetes mellitus without complication ([place])   . Diabetic peripheral neuropathy ([place]) 03/07/15  . Diabetic peripheral neuropathy ([place]) 03/07/15  . Diabetic peripheral neuropathy ([place]) 03/07/15  . GERD (gastroesophageal reflux disease)   . Hypercholesterolemia 03/07/15  . Hypertension   . Obesity 03/07/15  . Personality disorder ([place]) 03/07/15  . Sinus tachycardia 03/07/15   history of  . Suicidal thoughts 03/07/15    Past Surgical History:  Procedure Laterality Date  . COLONOSCOPY WITH PROPOFOL N/A 12/01/2018   Procedure: COLONOSCOPY WITH PROPOFOL;  Surgeon: Jonathon Bellows, MD;  Location: [facility] ENDOSCOPY;  Service: Gastroenterology;  Laterality: N/A;  . electroconvulsion therapy  03/07/15    Family History  Problem Relation Age of Onset  . Hypertension Father   . Diabetes Mother    Social History:  reports that she has never smoked. She has never used smokeless tobacco. She reports that she does not drink alcohol and does not use drugs.  Allergies:  Allergies  Allergen Reactions  . Prednisone     Increases blood sugar    (Not in a hospital admission)   Results for orders placed or performed during the hospital encounter of 08/18/20 (from the past 48 hour(s))  Glucose, capillary     Status: Abnormal   Collection Time: 08/18/20  1:24 PM  Result Value Ref Range   Glucose-Capillary 131 (H) 70 - 99 mg/dL    Comment: Glucose reference range applies only to samples taken after fasting for at least 8 hours.   No results found.  Review of Systems  Constitutional: Negative.   HENT: Negative.   Eyes: Negative.   Respiratory: Negative.   Cardiovascular: Negative.   Gastrointestinal: Negative.   Musculoskeletal: Negative.   Skin: Negative.   Neurological:  Negative.   Psychiatric/Behavioral: Negative.     Blood pressure 127/81, pulse 99, temperature 99 F (37.2 C), resp. rate (!) 24, height 5\' 4"  (1.626 m), weight 103.6 kg, SpO2 97 %. Physical Exam Vitals and nursing note reviewed.  Constitutional:      Appearance: She is well-developed.  HENT:     Head: Normocephalic and atraumatic.  Eyes:     Conjunctiva/sclera: Conjunctivae normal.     Pupils: Pupils are equal, round, and reactive to light.  Cardiovascular:     Heart sounds: Normal heart sounds.  Pulmonary:     Effort: Pulmonary effort is normal.  Abdominal:     Palpations: Abdomen is soft.  Musculoskeletal:        General: Normal range of motion.     Cervical back: Normal range of motion.  Skin:    General: Skin is warm and dry.  Neurological:     General: No focal deficit present.     Mental Status: She is alert.  Psychiatric:        Mood and Affect: Mood normal.        Thought Content: Thought content normal.        Judgment: Judgment normal.      Assessment/Plan Patient continues to report subjective benefit from ECT continue this time on a weekly schedule  [name], MD 08/18/2020, 2:34 PM

## 2020-08-18 NOTE — Procedures (Signed)
ECT SERVICES Physician's Interval Evaluation & Treatment Note  Patient Identification: Melinda [name] MRN:  [id number] Date of Evaluation:  08/18/2020 TX #: [id number]  MADRS:   MMSE:   P.E. Findings:  Normal physical exam  Psychiatric Interval Note:  Chronic dysphoria  Subjective:  Patient is a 49 y.o. female seen for evaluation for Electroconvulsive Therapy. Mild depression  Treatment Summary:   []   Right Unilateral             [x]  Bilateral   % Energy : 1.0 ms 35%   Impedance: 1210 ohms  Seizure Energy Index: 7004 V squared  Postictal Suppression Index: 59%  Seizure Concordance Index: 92%  Medications  Pre Shock: Robinul 0.4 mg Toradol 30 mg Zofran 4 mg labetalol 30 mg esmolol 20 mg Brevital 70 mg succinylcholine 100 mg  Post Shock:    Seizure Duration: 38 seconds EMG 83 seconds EEG   Comments: Follow-up 2 weeks  Lungs:  [x]   Clear to auscultation               []  Other:   Heart:    [x]   Regular rhythm             []  irregular rhythm    [x]   Previous H&P reviewed, patient examined and there are NO CHANGES                 []   Previous H&P reviewed, patient examined and there are changes noted.   [name], MD 10/8/20212:36 PM

## 2020-08-18 NOTE — Anesthesia Preprocedure Evaluation (Signed)
Anesthesia Evaluation  Patient identified by MRN, date of birth, ID band Patient awake    Reviewed: Allergy & Precautions, H&P , NPO status , Patient's Chart, lab work & pertinent test results  Airway Mallampati: III       Dental  (+) Chipped   Pulmonary sleep apnea , neg recent URI, Not current smoker,           Cardiovascular hypertension, Pt. on medications (-) angina(-) Past MI      Neuro/Psych PSYCHIATRIC DISORDERS Depression Bipolar Disorder Peripheral neuropathy    GI/Hepatic Neg liver ROS, GERD  Controlled,  Endo/Other  diabetes, Type 2, Oral Hypoglycemic AgentsMorbid obesity  Renal/GU negative Renal ROS  negative genitourinary   Musculoskeletal   Abdominal   Peds  Hematology negative hematology ROS (+)   Anesthesia Other Findings   Reproductive/Obstetrics negative OB ROS                                                              Anesthesia Evaluation  Patient identified by MRN, date of birth, ID band Patient awake    Reviewed: Allergy & Precautions, H&P , NPO status , Patient's Chart, lab work & pertinent test results  History of Anesthesia Complications Negative for: history of anesthetic complications  Airway Mallampati: II  TM Distance: >3 FB Neck ROM: full    Dental  (+) Chipped   Pulmonary sleep apnea , neg COPD, neg recent URI,           Cardiovascular hypertension, (-) angina     Neuro/Psych PSYCHIATRIC DISORDERS Depression Bipolar Disorder negative neurological ROS     GI/Hepatic Neg liver ROS, GERD  Controlled,  Endo/Other  diabetesMorbid obesity  Renal/GU negative Renal ROS  negative genitourinary   Musculoskeletal   Abdominal   Peds  Hematology negative hematology ROS (+)   Anesthesia Other Findings Past Medical History: No date: Depression No date: Diabetes mellitus without complication ([place]) 08/01/18: Diabetic peripheral  neuropathy ([place]) 03/07/15: Diabetic peripheral neuropathy ([place]) 03/07/15: Diabetic peripheral neuropathy ([facility]) No date: GERD (gastroesophageal reflux disease) 03/07/15: Hypercholesterolemia No date: Hypertension 03/07/15: Obesity 03/07/15: Personality disorder ([place]) 03/07/15: Sinus tachycardia     Comment:  history of 03/07/15: Suicidal thoughts  Past Surgical History: 12/01/2018: COLONOSCOPY WITH PROPOFOL; N/A     Comment:  Procedure: COLONOSCOPY WITH PROPOFOL;  Surgeon: Jonathon Bellows, MD;  [facility] ENDOSCOPY;  Service:               Gastroenterology;  Laterality: N/A; 03/07/15: electroconvulsion therapy  BMI    Body Mass Index: 44.52 kg/m      Reproductive/Obstetrics negative OB ROS                             Anesthesia Physical  Anesthesia Plan  ASA: III  Anesthesia Plan: General   Post-op Pain Management:    Induction:   PONV Risk Score and Plan:   Airway Management Planned: Mask  Additional Equipment:   Intra-op Plan:   Post-operative Plan:   Informed Consent: I have reviewed the patients History and Physical, chart, labs and discussed the procedure including the risks, benefits and alternatives for the proposed anesthesia with the  patient or authorized representative who has indicated his/her understanding and acceptance.     Dental Advisory Given  Plan Discussed with: Anesthesiologist and CRNA  Anesthesia Plan Comments:         Anesthesia Quick Evaluation                                   Anesthesia Evaluation  Patient identified by MRN, date of birth, ID band Patient awake    Reviewed: Allergy & Precautions, H&P , NPO status , Patient's Chart, lab work & pertinent test results  History of Anesthesia Complications Negative for: history of anesthetic complications  Airway Mallampati: II  TM Distance: >3 FB Neck ROM: full    Dental  (+) Chipped   Pulmonary sleep apnea , neg COPD, neg recent  URI,           Cardiovascular hypertension, (-) angina     Neuro/Psych PSYCHIATRIC DISORDERS Depression Bipolar Disorder negative neurological ROS     GI/Hepatic Neg liver ROS, GERD  Controlled,  Endo/Other  diabetesMorbid obesity  Renal/GU negative Renal ROS  negative genitourinary   Musculoskeletal   Abdominal   Peds  Hematology negative hematology ROS (+)   Anesthesia Other Findings Past Medical History: No date: Depression No date: Diabetes mellitus without complication ([place]) 4/94/49: Diabetic peripheral neuropathy ([place]) 03/07/15: Diabetic peripheral neuropathy ([place]) 03/07/15: Diabetic peripheral neuropathy ([facility]) No date: GERD (gastroesophageal reflux disease) 03/07/15: Hypercholesterolemia No date: Hypertension 03/07/15: Obesity 03/07/15: Personality disorder ([place]) 03/07/15: Sinus tachycardia     Comment:  history of 03/07/15: Suicidal thoughts  Past Surgical History: 12/01/2018: COLONOSCOPY WITH PROPOFOL; N/A     Comment:  Procedure: COLONOSCOPY WITH PROPOFOL;  Surgeon: Jonathon Bellows, MD;  Location: [facility] ENDOSCOPY;  Service:               Gastroenterology;  Laterality: N/A; 03/07/15: electroconvulsion therapy  BMI    Body Mass Index: 44.52 kg/m      Reproductive/Obstetrics negative OB ROS                             Anesthesia Physical  Anesthesia Plan  ASA: III  Anesthesia Plan: General   Post-op Pain Management:    Induction:   PONV Risk Score and Plan:   Airway Management Planned: Mask  Additional Equipment:   Intra-op Plan:   Post-operative Plan:   Informed Consent: I have reviewed the patients History and Physical, chart, labs and discussed the procedure including the risks, benefits and alternatives for the proposed anesthesia with the patient or authorized representative who has indicated his/her understanding and acceptance.     Dental Advisory Given  Plan Discussed with:  Anesthesiologist and CRNA  Anesthesia Plan Comments:         Anesthesia Quick Evaluation  Anesthesia Physical  Anesthesia Plan  ASA: II  Anesthesia Plan: General   Post-op Pain Management:    Induction:   PONV Risk Score and Plan:   Airway Management Planned: Mask  Additional Equipment:   Intra-op Plan:   Post-operative Plan:   Informed Consent: I have reviewed the patients History and Physical, chart, labs and discussed the procedure including the risks, benefits and alternatives for the proposed anesthesia with the patient or authorized representative who has indicated his/her understanding and acceptance.     Dental  Advisory Given  Plan Discussed with: Anesthesiologist, CRNA and Surgeon  Anesthesia Plan Comments:         Anesthesia Quick Evaluation

## 2020-08-18 NOTE — Transfer of Care (Signed)
Immediate Anesthesia Transfer of Care Note  Patient: Melinda [name]  Procedure(s) Performed: ECT TX  Patient Location: PACU  Anesthesia Type:General  Level of Consciousness: drowsy  Airway & Oxygen Therapy: Patient Spontanous Breathing and Patient connected to face mask oxygen  Post-op Assessment: Report given to RN and Post -op Vital signs reviewed and stable  Post vital signs: Reviewed and stable  Last Vitals:  Vitals Value Taken Time  BP 137/74 08/18/20 1318  Temp 36.4 C 08/18/20 1318  Pulse 107 08/18/20 1318  Resp 20 08/18/20 1318  SpO2 99 % 08/18/20 1318    Last Pain:  Vitals:   08/18/20 1318  TempSrc:   PainSc: Asleep         Complications: No complications documented.

## 2020-08-19 NOTE — Anesthesia Postprocedure Evaluation (Signed)
Anesthesia Post Note  Patient: Melinda [name]  Procedure(s) Performed: ECT TX  Patient location during evaluation: PACU Anesthesia Type: General Level of consciousness: awake and alert Pain management: pain level controlled Vital Signs Assessment: post-procedure vital signs reviewed and stable Respiratory status: spontaneous breathing, nonlabored ventilation, respiratory function stable and patient connected to nasal cannula oxygen Cardiovascular status: blood pressure returned to baseline and stable Postop Assessment: no apparent nausea or vomiting Anesthetic complications: no   No complications documented.   Last Vitals:  Vitals:   08/18/20 1343 08/18/20 1345  BP: 127/81 125/81  Pulse: 99 95  Resp: (!) 24 20  Temp:  36.7 C  SpO2: 97%     Last Pain:  Vitals:   08/18/20 1345  TempSrc: Oral  PainSc: 0-No pain                 Martha Clan

## 2020-08-30 ENCOUNTER — Other Ambulatory Visit
Admission: RE | Admit: 2020-08-30 | Discharge: 2020-08-30 | Disposition: A | Discharge status: Home / Self Care | Payer: Medicare PPO | Source: Ambulatory Visit | Attending: Psychiatry | Admitting: Psychiatry

## 2020-08-30 ENCOUNTER — Other Ambulatory Visit: Payer: Self-pay

## 2020-08-30 DIAGNOSIS — Z01812 Encounter for preprocedural laboratory examination: Secondary | ICD-10-CM | POA: Diagnosis not present

## 2020-08-30 DIAGNOSIS — Z20822 Contact with and (suspected) exposure to covid-19: Secondary | ICD-10-CM | POA: Insufficient documentation

## 2020-08-31 ENCOUNTER — Other Ambulatory Visit: Payer: Self-pay | Admitting: Psychiatry

## 2020-08-31 LAB — SARS CORONAVIRUS 2 (TAT 6-24 HRS): SARS Coronavirus 2: NEGATIVE

## 2020-09-01 ENCOUNTER — Other Ambulatory Visit: Payer: Self-pay

## 2020-09-01 ENCOUNTER — Ambulatory Visit: Payer: Self-pay | Admitting: Certified Registered"

## 2020-09-01 ENCOUNTER — Encounter: Payer: Self-pay | Admitting: Certified Registered"

## 2020-09-01 ENCOUNTER — Ambulatory Visit
Admission: RE | Admit: 2020-09-01 | Discharge: 2020-09-01 | Disposition: A | Discharge status: Home / Self Care | Payer: Medicare PPO | Source: Ambulatory Visit | Attending: Psychiatry | Admitting: Psychiatry

## 2020-09-01 DIAGNOSIS — F329 Major depressive disorder, single episode, unspecified: Secondary | ICD-10-CM | POA: Diagnosis not present

## 2020-09-01 DIAGNOSIS — E119 Type 2 diabetes mellitus without complications: Secondary | ICD-10-CM | POA: Insufficient documentation

## 2020-09-01 DIAGNOSIS — Z888 Allergy status to other drugs, medicaments and biological substances status: Secondary | ICD-10-CM | POA: Insufficient documentation

## 2020-09-01 DIAGNOSIS — F339 Major depressive disorder, recurrent, unspecified: Secondary | ICD-10-CM | POA: Insufficient documentation

## 2020-09-01 LAB — GLUCOSE, CAPILLARY: Glucose-Capillary: 117 mg/dL — ABNORMAL HIGH (ref 70–99)

## 2020-09-01 MED ORDER — GLYCOPYRROLATE 0.2 MG/ML IJ SOLN
INTRAMUSCULAR | Status: AC
  Administered 2020-09-01: 0.4 mg via INTRAVENOUS
  Filled 2020-09-01: qty 2

## 2020-09-01 MED ORDER — KETOROLAC TROMETHAMINE 30 MG/ML IJ SOLN: 30.0000 mg | Freq: Once | INTRAMUSCULAR | Status: AC

## 2020-09-01 MED ORDER — GLYCOPYRROLATE 0.2 MG/ML IJ SOLN: 0.4000 mg | Freq: Once | INTRAMUSCULAR | Status: AC

## 2020-09-01 MED ORDER — METHOHEXITAL SODIUM 100 MG/10ML IV SOSY
PREFILLED_SYRINGE | INTRAVENOUS | Status: DC | PRN
  Administered 2020-09-01: 70 mg via INTRAVENOUS

## 2020-09-01 MED ORDER — SUCCINYLCHOLINE CHLORIDE 200 MG/10ML IV SOSY
PREFILLED_SYRINGE | INTRAVENOUS | Status: AC
  Filled 2020-09-01: qty 10

## 2020-09-01 MED ORDER — ESMOLOL HCL 100 MG/10ML IV SOLN
INTRAVENOUS | Status: DC | PRN
  Administered 2020-09-01: 20 mg via INTRAVENOUS

## 2020-09-01 MED ORDER — SODIUM CHLORIDE 0.9 % IV SOLN
500.0000 mL | Freq: Once | INTRAVENOUS | Status: AC
  Administered 2020-09-01: 500 mL via INTRAVENOUS

## 2020-09-01 MED ORDER — LABETALOL HCL 5 MG/ML IV SOLN
INTRAVENOUS | Status: AC
  Filled 2020-09-01: qty 4

## 2020-09-01 MED ORDER — KETOROLAC TROMETHAMINE 30 MG/ML IJ SOLN
INTRAMUSCULAR | Status: AC
  Administered 2020-09-01: 30 mg via INTRAVENOUS
  Filled 2020-09-01: qty 1

## 2020-09-01 MED ORDER — ESMOLOL HCL 100 MG/10ML IV SOLN
INTRAVENOUS | Status: AC
  Filled 2020-09-01: qty 10

## 2020-09-01 MED ORDER — LABETALOL HCL 5 MG/ML IV SOLN
INTRAVENOUS | Status: DC | PRN
  Administered 2020-09-01: 30 mg via INTRAVENOUS

## 2020-09-01 MED ORDER — SUCCINYLCHOLINE CHLORIDE 20 MG/ML IJ SOLN
INTRAMUSCULAR | Status: DC | PRN
  Administered 2020-09-01: 100 mg via INTRAVENOUS

## 2020-09-01 NOTE — Anesthesia Preprocedure Evaluation (Signed)
Anesthesia Evaluation  Patient identified by MRN, date of birth, ID band Patient awake    Reviewed: Allergy & Precautions, H&P , NPO status , Patient's Chart, lab work & pertinent test results  Airway Mallampati: III  TM Distance: >3 FB Neck ROM: full    Dental  (+) Chipped   Pulmonary sleep apnea , neg recent URI, Not current smoker,           Cardiovascular hypertension, (-) angina(-) Past MI      Neuro/Psych PSYCHIATRIC DISORDERS Depression Bipolar Disorder Peripheral neuropathy    GI/Hepatic Neg liver ROS, GERD  Controlled,  Endo/Other  diabetesMorbid obesity  Renal/GU negative Renal ROS  negative genitourinary   Musculoskeletal   Abdominal   Peds  Hematology negative hematology ROS (+)   Anesthesia Other Findings Past Medical History: No date: Depression No date: Diabetes mellitus without complication ([place]) 07/10/55: Diabetic peripheral neuropathy ([place]) 03/07/15: Diabetic peripheral neuropathy ([place]) 03/07/15: Diabetic peripheral neuropathy (HCC) No date: GERD (gastroesophageal reflux disease) 03/07/15: Hypercholesterolemia No date: Hypertension 03/07/15: Obesity 03/07/15: Personality disorder ([place]) 03/07/15: Sinus tachycardia     Comment:  history of 03/07/15: Suicidal thoughts  Past Surgical History: 12/01/2018: COLONOSCOPY WITH PROPOFOL; N/A     Comment:  Procedure: COLONOSCOPY WITH PROPOFOL;  Surgeon: Jonathon Bellows, MD;  Location: [facility] ENDOSCOPY;  Service:               Gastroenterology;  Laterality: N/A; 03/07/15: electroconvulsion therapy  BMI    Body Mass Index: 39.48 kg/m      Reproductive/Obstetrics negative OB ROS                                                              Anesthesia Evaluation  Patient identified by MRN, date of birth, ID band Patient awake    Reviewed: Allergy & Precautions, H&P , NPO status , Patient's Chart, lab work & pertinent test  results  History of Anesthesia Complications Negative for: history of anesthetic complications  Airway Mallampati: II  TM Distance: >3 FB Neck ROM: full    Dental  (+) Chipped   Pulmonary sleep apnea , neg COPD, neg recent URI,           Cardiovascular hypertension, (-) angina     Neuro/Psych PSYCHIATRIC DISORDERS Depression Bipolar Disorder negative neurological ROS     GI/Hepatic Neg liver ROS, GERD  Controlled,  Endo/Other  diabetesMorbid obesity  Renal/GU negative Renal ROS  negative genitourinary   Musculoskeletal   Abdominal   Peds  Hematology negative hematology ROS (+)   Anesthesia Other Findings Past Medical History: No date: Depression No date: Diabetes mellitus without complication ([place]) 12/24/06: Diabetic peripheral neuropathy ([place]) 03/07/15: Diabetic peripheral neuropathy ([place]) 03/07/15: Diabetic peripheral neuropathy (HCC) No date: GERD (gastroesophageal reflux disease) 03/07/15: Hypercholesterolemia No date: Hypertension 03/07/15: Obesity 03/07/15: Personality disorder ([place]) 03/07/15: Sinus tachycardia     Comment:  history of 03/07/15: Suicidal thoughts  Past Surgical History: 12/01/2018: COLONOSCOPY WITH PROPOFOL; N/A     Comment:  Procedure: COLONOSCOPY WITH PROPOFOL;  Surgeon: Jonathon Bellows, MD;  Location: [facility] ENDOSCOPY;  Service:  Gastroenterology;  Laterality: N/A; 03/07/15: electroconvulsion therapy  BMI    Body Mass Index: 44.52 kg/m      Reproductive/Obstetrics negative OB ROS                             Anesthesia Physical  Anesthesia Plan  ASA: III  Anesthesia Plan: General   Post-op Pain Management:    Induction:   PONV Risk Score and Plan:   Airway Management Planned: Mask  Additional Equipment:   Intra-op Plan:   Post-operative Plan:   Informed Consent: I have reviewed the patients History and Physical, chart, labs and discussed the procedure  including the risks, benefits and alternatives for the proposed anesthesia with the patient or authorized representative who has indicated his/her understanding and acceptance.     Dental Advisory Given  Plan Discussed with: Anesthesiologist and CRNA  Anesthesia Plan Comments:         Anesthesia Quick Evaluation                                   Anesthesia Evaluation  Patient identified by MRN, date of birth, ID band Patient awake    Reviewed: Allergy & Precautions, H&P , NPO status , Patient's Chart, lab work & pertinent test results  History of Anesthesia Complications Negative for: history of anesthetic complications  Airway Mallampati: II  TM Distance: >3 FB Neck ROM: full    Dental  (+) Chipped   Pulmonary sleep apnea , neg COPD, neg recent URI,           Cardiovascular hypertension, (-) angina     Neuro/Psych PSYCHIATRIC DISORDERS Depression Bipolar Disorder negative neurological ROS     GI/Hepatic Neg liver ROS, GERD  Controlled,  Endo/Other  diabetesMorbid obesity  Renal/GU negative Renal ROS  negative genitourinary   Musculoskeletal   Abdominal   Peds  Hematology negative hematology ROS (+)   Anesthesia Other Findings Past Medical History: No date: Depression No date: Diabetes mellitus without complication ([place]) 6/96/29: Diabetic peripheral neuropathy ([place]) 03/07/15: Diabetic peripheral neuropathy ([place]) 03/07/15: Diabetic peripheral neuropathy (HCC) No date: GERD (gastroesophageal reflux disease) 03/07/15: Hypercholesterolemia No date: Hypertension 03/07/15: Obesity 03/07/15: Personality disorder ([place]) 03/07/15: Sinus tachycardia     Comment:  history of 03/07/15: Suicidal thoughts  Past Surgical History: 12/01/2018: COLONOSCOPY WITH PROPOFOL; N/A     Comment:  Procedure: COLONOSCOPY WITH PROPOFOL;  Surgeon: Jonathon Bellows, MD;  Location: [facility] ENDOSCOPY;  Service:               Gastroenterology;  Laterality:  N/A; 03/07/15: electroconvulsion therapy  BMI    Body Mass Index: 44.52 kg/m      Reproductive/Obstetrics negative OB ROS                             Anesthesia Physical  Anesthesia Plan  ASA: III  Anesthesia Plan: General   Post-op Pain Management:    Induction:   PONV Risk Score and Plan:   Airway Management Planned: Mask  Additional Equipment:   Intra-op Plan:   Post-operative Plan:   Informed Consent: I have reviewed the patients History and Physical, chart, labs and discussed the procedure including the risks, benefits and alternatives for the proposed anesthesia with the patient or authorized  representative who has indicated his/her understanding and acceptance.     Dental Advisory Given  Plan Discussed with: Anesthesiologist and CRNA  Anesthesia Plan Comments:         Anesthesia Quick Evaluation  Anesthesia Physical  Anesthesia Plan  ASA: II  Anesthesia Plan: General   Post-op Pain Management:    Induction:   PONV Risk Score and Plan:   Airway Management Planned: Mask  Additional Equipment:   Intra-op Plan:   Post-operative Plan:   Informed Consent: I have reviewed the patients History and Physical, chart, labs and discussed the procedure including the risks, benefits and alternatives for the proposed anesthesia with the patient or authorized representative who has indicated his/her understanding and acceptance.     Dental Advisory Given  Plan Discussed with: Anesthesiologist, CRNA and Surgeon  Anesthesia Plan Comments:         Anesthesia Quick Evaluation

## 2020-09-01 NOTE — Transfer of Care (Signed)
Immediate Anesthesia Transfer of Care Note  Patient: Melinda [name]  Procedure(s) Performed: ECT TX  Patient Location: PACU  Anesthesia Type:General  Level of Consciousness: drowsy  Airway & Oxygen Therapy: Patient Spontanous Breathing and Patient connected to face mask oxygen  Post-op Assessment: Report given to RN and Post -op Vital signs reviewed and stable  Post vital signs: stable  Last Vitals:  Vitals Value Taken Time  BP    Temp    Pulse    Resp    SpO2      Last Pain:  Vitals:   09/01/20 1141  TempSrc:   PainSc: 0-No pain         Complications: No complications documented.

## 2020-09-04 NOTE — Anesthesia Postprocedure Evaluation (Signed)
Anesthesia Post Note  Patient: Melinda [name]  Procedure(s) Performed: ECT TX  Patient location during evaluation: PACU Anesthesia Type: General Level of consciousness: awake and alert Pain management: pain level controlled Vital Signs Assessment: post-procedure vital signs reviewed and stable Respiratory status: spontaneous breathing, nonlabored ventilation and respiratory function stable Cardiovascular status: blood pressure returned to baseline and stable Postop Assessment: no apparent nausea or vomiting Anesthetic complications: no   No complications documented.   Last Vitals:  Vitals:   09/01/20 1406 09/01/20 1415  BP: 98/61 100/75  Pulse: 95 90  Resp: 16 16  Temp: (!) 36.3 C (!) 36.3 C  SpO2: 98%     Last Pain:  Vitals:   09/01/20 1415  TempSrc: Oral  PainSc: 0-No pain                 Brett Canales Ranell Finelli

## 2020-09-11 ENCOUNTER — Other Ambulatory Visit: Payer: Self-pay | Admitting: Physician Assistant

## 2020-09-11 DIAGNOSIS — E119 Type 2 diabetes mellitus without complications: Secondary | ICD-10-CM

## 2020-09-11 NOTE — Telephone Encounter (Signed)
   Requested medication (s) are on the active medication list: yes  Last refill:  07/17/2020  Future visit scheduled: yes  Notes to clinic:  Medication not assigned to a protocol, review manually   Requested Prescriptions  Pending Prescriptions Disp Refills   Alcohol Swabs (B-D SINGLE USE SWABS REGULAR) PADS [Pharmacy Med Name: BD SWABS SINGLE USE   Pad] 300 each 2    Sig: USE AS DIRECTED      Off-Protocol Failed - 09/11/2020  9:46 PM      Failed - Medication not assigned to a protocol, review manually.      Passed - Valid encounter within last 12 months    Recent Outpatient Visits           3 months ago Controlled type 2 diabetes mellitus without complication, without long-term current use of insulin [facility])   [place], [place], PA-C   8 months ago Controlled type 2 diabetes mellitus without complication, without long-term current use of insulin [facility])   [facility] [place], Fabio Bering M, [place]   12 months ago Type 2 diabetes mellitus with diabetic polyneuropathy, without long-term current use of insulin [facility])   [facility] [place], [place], [place]   1 year ago Type 2 diabetes mellitus with diabetic polyneuropathy, without long-term current use of insulin [facility])   [facility] [place], [place], [place]   1 year ago Right arm pain   [facility] Trinna Post, [place]       Future Appointments             In 2 months Trinna Post, PA-C [employer], [facility]

## 2020-09-13 ENCOUNTER — Other Ambulatory Visit: Payer: Self-pay | Admitting: Physician Assistant

## 2020-09-13 ENCOUNTER — Telehealth: Payer: Self-pay

## 2020-09-13 DIAGNOSIS — E119 Type 2 diabetes mellitus without complications: Secondary | ICD-10-CM

## 2020-09-13 NOTE — Telephone Encounter (Signed)
Copied from [place] [id number]. Topic: General - Other >> Sep 13, 2020  9:13 AM Rainey Pines A wrote: Pt called to inform pcp that [facility] is sending over a new prescription for a new glucose meter different than the one she has had before today. Please advise

## 2020-09-20 ENCOUNTER — Other Ambulatory Visit: Payer: Self-pay

## 2020-09-20 ENCOUNTER — Other Ambulatory Visit
Admission: RE | Admit: 2020-09-20 | Discharge: 2020-09-20 | Disposition: A | Discharge status: Home / Self Care | Payer: Medicare PPO | Source: Ambulatory Visit | Attending: Psychiatry | Admitting: Psychiatry

## 2020-09-20 DIAGNOSIS — Z01812 Encounter for preprocedural laboratory examination: Secondary | ICD-10-CM | POA: Diagnosis not present

## 2020-09-20 DIAGNOSIS — Z20822 Contact with and (suspected) exposure to covid-19: Secondary | ICD-10-CM | POA: Diagnosis not present

## 2020-09-20 LAB — SARS CORONAVIRUS 2 (TAT 6-24 HRS): SARS Coronavirus 2: NEGATIVE

## 2020-09-21 ENCOUNTER — Other Ambulatory Visit: Payer: Self-pay | Admitting: Psychiatry

## 2020-09-22 ENCOUNTER — Other Ambulatory Visit: Payer: Self-pay

## 2020-09-22 ENCOUNTER — Ambulatory Visit: Payer: Self-pay | Admitting: Anesthesiology

## 2020-09-22 ENCOUNTER — Encounter: Payer: Self-pay | Admitting: Anesthesiology

## 2020-09-22 ENCOUNTER — Encounter
Admission: RE | Admit: 2020-09-22 | Discharge: 2020-09-22 | Disposition: A | Discharge status: Home / Self Care | Payer: Medicare PPO | Source: Ambulatory Visit | Attending: Psychiatry | Admitting: Psychiatry

## 2020-09-22 DIAGNOSIS — I10 Essential (primary) hypertension: Secondary | ICD-10-CM | POA: Diagnosis not present

## 2020-09-22 DIAGNOSIS — F339 Major depressive disorder, recurrent, unspecified: Secondary | ICD-10-CM | POA: Diagnosis not present

## 2020-09-22 DIAGNOSIS — Z888 Allergy status to other drugs, medicaments and biological substances status: Secondary | ICD-10-CM | POA: Insufficient documentation

## 2020-09-22 DIAGNOSIS — E7800 Pure hypercholesterolemia, unspecified: Secondary | ICD-10-CM | POA: Diagnosis not present

## 2020-09-22 DIAGNOSIS — R569 Unspecified convulsions: Secondary | ICD-10-CM | POA: Diagnosis not present

## 2020-09-22 DIAGNOSIS — G4730 Sleep apnea, unspecified: Secondary | ICD-10-CM | POA: Diagnosis not present

## 2020-09-22 DIAGNOSIS — F332 Major depressive disorder, recurrent severe without psychotic features: Secondary | ICD-10-CM

## 2020-09-22 DIAGNOSIS — E1140 Type 2 diabetes mellitus with diabetic neuropathy, unspecified: Secondary | ICD-10-CM | POA: Diagnosis not present

## 2020-09-22 LAB — GLUCOSE, CAPILLARY
Glucose-Capillary: 120 mg/dL — ABNORMAL HIGH (ref 70–99)
Glucose-Capillary: 147 mg/dL — ABNORMAL HIGH (ref 70–99)

## 2020-09-22 MED ORDER — LABETALOL HCL 5 MG/ML IV SOLN
INTRAVENOUS | Status: DC | PRN
  Administered 2020-09-22: 30 mg via INTRAVENOUS

## 2020-09-22 MED ORDER — ESMOLOL HCL 100 MG/10ML IV SOLN
INTRAVENOUS | Status: AC
  Filled 2020-09-22: qty 10

## 2020-09-22 MED ORDER — KETOROLAC TROMETHAMINE 30 MG/ML IJ SOLN
INTRAMUSCULAR | Status: AC
  Administered 2020-09-22: 30 mg via INTRAVENOUS
  Filled 2020-09-22: qty 1

## 2020-09-22 MED ORDER — LABETALOL HCL 5 MG/ML IV SOLN
INTRAVENOUS | Status: AC
  Filled 2020-09-22: qty 4

## 2020-09-22 MED ORDER — SUCCINYLCHOLINE CHLORIDE 20 MG/ML IJ SOLN
INTRAMUSCULAR | Status: DC | PRN
  Administered 2020-09-22: 100 mg via INTRAVENOUS

## 2020-09-22 MED ORDER — GLYCOPYRROLATE 0.2 MG/ML IJ SOLN: 0.4000 mg | Freq: Once | INTRAMUSCULAR | Status: AC

## 2020-09-22 MED ORDER — SUCCINYLCHOLINE CHLORIDE 200 MG/10ML IV SOSY
PREFILLED_SYRINGE | INTRAVENOUS | Status: AC
  Filled 2020-09-22: qty 10

## 2020-09-22 MED ORDER — METHOHEXITAL SODIUM 100 MG/10ML IV SOSY
PREFILLED_SYRINGE | INTRAVENOUS | Status: DC | PRN
  Administered 2020-09-22: 70 mg via INTRAVENOUS

## 2020-09-22 MED ORDER — ESMOLOL HCL 100 MG/10ML IV SOLN
INTRAVENOUS | Status: DC | PRN
  Administered 2020-09-22: 10 mg via INTRAVENOUS

## 2020-09-22 MED ORDER — GLYCOPYRROLATE 0.2 MG/ML IJ SOLN
INTRAMUSCULAR | Status: AC
  Administered 2020-09-22: 0.4 mg via INTRAVENOUS
  Filled 2020-09-22: qty 2

## 2020-09-22 MED ORDER — KETOROLAC TROMETHAMINE 30 MG/ML IJ SOLN: 30.0000 mg | Freq: Once | INTRAMUSCULAR | Status: AC

## 2020-09-22 MED ORDER — SODIUM CHLORIDE 0.9 % IV SOLN: INTRAVENOUS | Status: DC | PRN

## 2020-09-22 MED ORDER — SODIUM CHLORIDE 0.9 % IV SOLN
500.0000 mL | Freq: Once | INTRAVENOUS | Status: AC
  Administered 2020-09-22: 500 mL via INTRAVENOUS

## 2020-09-22 NOTE — Anesthesia Preprocedure Evaluation (Signed)
Anesthesia Evaluation  Patient identified by MRN, date of birth, ID band Patient awake    Reviewed: Allergy & Precautions, H&P , NPO status , Patient's Chart, lab work & pertinent test results  History of Anesthesia Complications Negative for: history of anesthetic complications  Airway Mallampati: II  TM Distance: >3 FB Neck ROM: full    Dental  (+) Poor Dentition, Chipped   Pulmonary sleep apnea , neg COPD,    Pulmonary exam normal breath sounds clear to auscultation       Cardiovascular hypertension, Pt. on medications (-) CAD and (-) Past MI negative cardio ROS Normal cardiovascular exam Rhythm:regular Rate:Normal     Neuro/Psych PSYCHIATRIC DISORDERS Depression Bipolar Disorder  Neuromuscular disease negative neurological ROS     GI/Hepatic negative GI ROS, Neg liver ROS, GERD  Medicated,  Endo/Other  diabetes, Type 2, Oral Hypoglycemic Agents  Renal/GU negative Renal ROS  negative genitourinary   Musculoskeletal   Abdominal (+) + obese,   Peds  Hematology negative hematology ROS (+)   Anesthesia Other Findings Past Medical History: No date: Depression No date: Diabetes mellitus without complication (HCC) 2/56/38: Diabetic peripheral neuropathy ([place]) 03/07/15: Diabetic peripheral neuropathy ([place]) 03/07/15: Diabetic peripheral neuropathy (HCC) No date: GERD (gastroesophageal reflux disease) 03/07/15: Hypercholesterolemia No date: Hypertension 03/07/15: Obesity 03/07/15: Personality disorder 03/07/15: Sinus tachycardia ([place])     Comment: history of 03/07/15: Suicidal thoughts Past Surgical History: 03/07/15: electroconvulsion therapy BMI    Body Mass Index:  36.44 kg/m     Reproductive/Obstetrics                             Anesthesia Physical  Anesthesia Plan  ASA: III  Anesthesia Plan: General   Post-op Pain Management:    Induction: Intravenous  PONV Risk Score and  Plan: 2 and Ondansetron  Airway Management Planned: Mask  Additional Equipment:   Intra-op Plan:   Post-operative Plan:   Informed Consent: I have reviewed the patients History and Physical, chart, labs and discussed the procedure including the risks, benefits and alternatives for the proposed anesthesia with the patient or authorized representative who has indicated his/her understanding and acceptance.     Dental Advisory Given  Plan Discussed with: CRNA and Anesthesiologist  Anesthesia Plan Comments:         Anesthesia Quick Evaluation

## 2020-09-22 NOTE — Transfer of Care (Signed)
Immediate Anesthesia Transfer of Care Note  Patient: Melinda [name]  Procedure(s) Performed: ECT TX  Patient Location: PACU  Anesthesia Type:General  Level of Consciousness: drowsy and patient cooperative  Airway & Oxygen Therapy: Patient Spontanous Breathing  Post-op Assessment: Report given to RN and Post -op Vital signs reviewed and stable  Post vital signs: Reviewed and stable  Last Vitals:  Vitals Value Taken Time  BP 162/88 09/22/20 1254  Temp    Pulse 99 09/22/20 1254  Resp 13 09/22/20 1254  SpO2 94 % 09/22/20 1254  Vitals shown include unvalidated device data.  Last Pain:  Vitals:   09/22/20 1216  TempSrc: Oral  PainSc: 0-No pain         Complications: No complications documented.

## 2020-09-22 NOTE — Anesthesia Postprocedure Evaluation (Signed)
Anesthesia Post Note  Patient: Melinda [name]  Procedure(s) Performed: ECT TX  Patient location during evaluation: PACU Anesthesia Type: General Level of consciousness: awake and alert Pain management: pain level controlled Vital Signs Assessment: post-procedure vital signs reviewed and stable Respiratory status: spontaneous breathing, nonlabored ventilation and respiratory function stable Cardiovascular status: blood pressure returned to baseline and stable Postop Assessment: no signs of nausea or vomiting Anesthetic complications: no   No complications documented.   Last Vitals:  Vitals:   09/22/20 1254 09/22/20 1313  BP: (!) 162/88 125/82  Pulse: 82 97  Resp: 16 (!) 27  Temp:    SpO2: 96% 96%    Last Pain:  Vitals:   09/22/20 1313  TempSrc:   PainSc: 0-No pain                 Alleene Stoy

## 2020-09-22 NOTE — Procedures (Signed)
ECT SERVICES Physician's Interval Evaluation & Treatment Note  Patient Identification: Melinda [name] MRN:  [id number] Date of Evaluation:  09/22/2020 TX #: [id number]  MADRS:   MMSE:   P.E. Findings:  No change to physical exam  Psychiatric Interval Note:  Stable.  No new complaints  Subjective:  Patient is a 49 y.o. female seen for evaluation for Electroconvulsive Therapy. Feeling well.  No suicidal thoughts  Treatment Summary:   []   Right Unilateral             [x]  Bilateral   % Energy : 1.0 ms 35%   Impedance: 820 ohms  Seizure Energy Index: 4000 V squared  Postictal Suppression Index: 77%  Seizure Concordance Index: 91%  Medications  Pre Shock: Robinul 0.4 mg Toradol 30 mg labetalol 20 mg esmolol 30 mg Brevital 70 mg succinylcholine 100 mg  Post Shock:    Seizure Duration: 33 seconds EMG 73 seconds EEG   Comments: Follow-up 3 weeks  Lungs:  [x]   Clear to auscultation               []  Other:   Heart:    [x]   Regular rhythm             []  irregular rhythm    [x]   Previous H&P reviewed, patient examined and there are NO CHANGES                 []   Previous H&P reviewed, patient examined and there are changes noted.   [name], MD 11/12/20213:48 PM

## 2020-09-22 NOTE — H&P (Signed)
Melinda [name] is an 49 y.o. female.   Chief Complaint: mood stable with no decline HPI: recurrent depression  Past Medical History:  Diagnosis Date  . Depression   . Diabetes mellitus without complication ([place])   . Diabetic peripheral neuropathy ([place]) 03/07/15  . Diabetic peripheral neuropathy ([place]) 03/07/15  . Diabetic peripheral neuropathy ([place]) 03/07/15  . GERD (gastroesophageal reflux disease)   . Hypercholesterolemia 03/07/15  . Hypertension   . Obesity 03/07/15  . Personality disorder ([place]) 03/07/15  . Sinus tachycardia 03/07/15   history of  . Suicidal thoughts 03/07/15    Past Surgical History:  Procedure Laterality Date  . COLONOSCOPY WITH PROPOFOL N/A 12/01/2018   Procedure: COLONOSCOPY WITH PROPOFOL;  Surgeon: Jonathon Bellows, MD;  Location: [facility] ENDOSCOPY;  Service: Gastroenterology;  Laterality: N/A;  . electroconvulsion therapy  03/07/15    Family History  Problem Relation Age of Onset  . Hypertension Father   . Diabetes Mother    Social History:  reports that she has never smoked. She has never used smokeless tobacco. She reports that she does not drink alcohol and does not use drugs.  Allergies:  Allergies  Allergen Reactions  . Prednisone     Increases blood sugar    (Not in a hospital admission)   Results for orders placed or performed during the hospital encounter of 09/22/20 (from the past 48 hour(s))  Glucose, capillary     Status: Abnormal   Collection Time: 09/22/20 11:55 AM  Result Value Ref Range   Glucose-Capillary 120 (H) 70 - 99 mg/dL    Comment: Glucose reference range applies only to samples taken after fasting for at least 8 hours.   No results found.  Review of Systems  Constitutional: Negative.   HENT: Negative.   Eyes: Negative.   Respiratory: Negative.   Cardiovascular: Negative.   Gastrointestinal: Negative.   Musculoskeletal: Negative.   Skin: Negative.   Neurological: Negative.   Psychiatric/Behavioral: Negative.     Blood  pressure 131/65, pulse 84, temperature 98.5 F (36.9 C), temperature source Oral, resp. rate 18, height 5\' 4"  (1.626 m), weight 103.6 kg, SpO2 98 %. Physical Exam Vitals and nursing note reviewed.  Constitutional:      Appearance: She is well-developed.  HENT:     Head: Normocephalic and atraumatic.  Eyes:     Conjunctiva/sclera: Conjunctivae normal.     Pupils: Pupils are equal, round, and reactive to light.  Cardiovascular:     Heart sounds: Normal heart sounds.  Pulmonary:     Effort: Pulmonary effort is normal.  Abdominal:     Palpations: Abdomen is soft.  Musculoskeletal:        General: Normal range of motion.     Cervical back: Normal range of motion.  Skin:    General: Skin is warm and dry.  Neurological:     General: No focal deficit present.     Mental Status: She is alert.  Psychiatric:        Mood and Affect: Mood normal.      Assessment/Plan Continue now on 3 week schedule  [name], MD 09/22/2020, 12:25 PM

## 2020-10-11 ENCOUNTER — Other Ambulatory Visit
Admission: RE | Admit: 2020-10-11 | Discharge: 2020-10-11 | Disposition: A | Discharge status: Home / Self Care | Payer: Medicare PPO | Source: Ambulatory Visit | Attending: Psychiatry | Admitting: Psychiatry

## 2020-10-11 ENCOUNTER — Other Ambulatory Visit: Payer: Self-pay

## 2020-10-11 DIAGNOSIS — Z20822 Contact with and (suspected) exposure to covid-19: Secondary | ICD-10-CM | POA: Insufficient documentation

## 2020-10-11 DIAGNOSIS — Z01812 Encounter for preprocedural laboratory examination: Secondary | ICD-10-CM | POA: Diagnosis not present

## 2020-10-11 LAB — SARS CORONAVIRUS 2 (TAT 6-24 HRS): SARS Coronavirus 2: NEGATIVE

## 2020-10-12 ENCOUNTER — Other Ambulatory Visit: Payer: Self-pay | Admitting: Psychiatry

## 2020-10-13 ENCOUNTER — Ambulatory Visit: Payer: Self-pay | Admitting: Anesthesiology

## 2020-10-13 ENCOUNTER — Encounter: Payer: Self-pay | Admitting: Anesthesiology

## 2020-10-13 ENCOUNTER — Encounter
Admission: RE | Admit: 2020-10-13 | Discharge: 2020-10-13 | Disposition: A | Discharge status: Home / Self Care | Payer: Medicare PPO | Source: Ambulatory Visit | Attending: Psychiatry | Admitting: Psychiatry

## 2020-10-13 ENCOUNTER — Other Ambulatory Visit: Payer: Self-pay

## 2020-10-13 DIAGNOSIS — F609 Personality disorder, unspecified: Secondary | ICD-10-CM | POA: Diagnosis not present

## 2020-10-13 DIAGNOSIS — F332 Major depressive disorder, recurrent severe without psychotic features: Secondary | ICD-10-CM | POA: Diagnosis not present

## 2020-10-13 DIAGNOSIS — Z6839 Body mass index (BMI) 39.0-39.9, adult: Secondary | ICD-10-CM | POA: Insufficient documentation

## 2020-10-13 DIAGNOSIS — Z888 Allergy status to other drugs, medicaments and biological substances status: Secondary | ICD-10-CM | POA: Diagnosis not present

## 2020-10-13 DIAGNOSIS — E669 Obesity, unspecified: Secondary | ICD-10-CM | POA: Diagnosis not present

## 2020-10-13 DIAGNOSIS — Z79899 Other long term (current) drug therapy: Secondary | ICD-10-CM | POA: Diagnosis not present

## 2020-10-13 DIAGNOSIS — Z7984 Long term (current) use of oral hypoglycemic drugs: Secondary | ICD-10-CM | POA: Insufficient documentation

## 2020-10-13 DIAGNOSIS — Z833 Family history of diabetes mellitus: Secondary | ICD-10-CM | POA: Insufficient documentation

## 2020-10-13 DIAGNOSIS — F329 Major depressive disorder, single episode, unspecified: Secondary | ICD-10-CM | POA: Diagnosis not present

## 2020-10-13 DIAGNOSIS — K219 Gastro-esophageal reflux disease without esophagitis: Secondary | ICD-10-CM | POA: Insufficient documentation

## 2020-10-13 DIAGNOSIS — E1140 Type 2 diabetes mellitus with diabetic neuropathy, unspecified: Secondary | ICD-10-CM | POA: Diagnosis not present

## 2020-10-13 DIAGNOSIS — I10 Essential (primary) hypertension: Secondary | ICD-10-CM | POA: Insufficient documentation

## 2020-10-13 DIAGNOSIS — Z8249 Family history of ischemic heart disease and other diseases of the circulatory system: Secondary | ICD-10-CM | POA: Diagnosis not present

## 2020-10-13 DIAGNOSIS — E1142 Type 2 diabetes mellitus with diabetic polyneuropathy: Secondary | ICD-10-CM | POA: Diagnosis not present

## 2020-10-13 DIAGNOSIS — E7800 Pure hypercholesterolemia, unspecified: Secondary | ICD-10-CM | POA: Insufficient documentation

## 2020-10-13 MED ORDER — METHOHEXITAL SODIUM 100 MG/10ML IV SOSY
PREFILLED_SYRINGE | INTRAVENOUS | Status: DC | PRN
  Administered 2020-10-13: 70 mg via INTRAVENOUS

## 2020-10-13 MED ORDER — ESMOLOL HCL 100 MG/10ML IV SOLN
INTRAVENOUS | Status: AC
  Filled 2020-10-13: qty 10

## 2020-10-13 MED ORDER — SUCCINYLCHOLINE CHLORIDE 20 MG/ML IJ SOLN
INTRAMUSCULAR | Status: DC | PRN
  Administered 2020-10-13: 100 mg via INTRAVENOUS

## 2020-10-13 MED ORDER — SUCCINYLCHOLINE CHLORIDE 200 MG/10ML IV SOSY
PREFILLED_SYRINGE | INTRAVENOUS | Status: AC
  Filled 2020-10-13: qty 10

## 2020-10-13 MED ORDER — GLYCOPYRROLATE 0.2 MG/ML IJ SOLN: 0.4000 mg | Freq: Once | INTRAMUSCULAR | Status: AC

## 2020-10-13 MED ORDER — GLYCOPYRROLATE 0.2 MG/ML IJ SOLN
INTRAMUSCULAR | Status: AC
  Administered 2020-10-13: 0.4 mg via INTRAVENOUS
  Filled 2020-10-13: qty 2

## 2020-10-13 MED ORDER — KETOROLAC TROMETHAMINE 30 MG/ML IJ SOLN
INTRAMUSCULAR | Status: AC
  Administered 2020-10-13: 30 mg via INTRAVENOUS
  Filled 2020-10-13: qty 1

## 2020-10-13 MED ORDER — LABETALOL HCL 5 MG/ML IV SOLN
INTRAVENOUS | Status: AC
  Filled 2020-10-13: qty 4

## 2020-10-13 MED ORDER — ESMOLOL HCL 100 MG/10ML IV SOLN
INTRAVENOUS | Status: DC | PRN
  Administered 2020-10-13: 20 mg via INTRAVENOUS

## 2020-10-13 MED ORDER — KETOROLAC TROMETHAMINE 30 MG/ML IJ SOLN: 30.0000 mg | Freq: Once | INTRAMUSCULAR | Status: AC

## 2020-10-13 MED ORDER — LABETALOL HCL 5 MG/ML IV SOLN
INTRAVENOUS | Status: DC | PRN
  Administered 2020-10-13: 30 mg via INTRAVENOUS

## 2020-10-13 MED ORDER — SODIUM CHLORIDE 0.9 % IV SOLN: INTRAVENOUS | Status: DC | PRN

## 2020-10-13 MED ORDER — SODIUM CHLORIDE 0.9 % IV SOLN
500.0000 mL | Freq: Once | INTRAVENOUS | Status: AC
  Administered 2020-10-13: 500 mL via INTRAVENOUS

## 2020-10-13 NOTE — Anesthesia Preprocedure Evaluation (Signed)
Anesthesia Evaluation  Patient identified by MRN, date of birth, ID band Patient awake    Reviewed: Allergy & Precautions, H&P , NPO status , Patient's Chart, lab work & pertinent test results  History of Anesthesia Complications Negative for: history of anesthetic complications  Airway Mallampati: II  TM Distance: >3 FB Neck ROM: full    Dental  (+) Poor Dentition, Chipped   Pulmonary sleep apnea , neg COPD,    Pulmonary exam normal breath sounds clear to auscultation       Cardiovascular hypertension, Pt. on medications (-) CAD and (-) Past MI negative cardio ROS Normal cardiovascular exam Rhythm:regular Rate:Normal     Neuro/Psych PSYCHIATRIC DISORDERS Depression Bipolar Disorder  Neuromuscular disease negative neurological ROS     GI/Hepatic negative GI ROS, Neg liver ROS, GERD  Medicated,  Endo/Other  diabetes, Type 2, Oral Hypoglycemic Agents  Renal/GU negative Renal ROS  negative genitourinary   Musculoskeletal   Abdominal (+) + obese,   Peds  Hematology negative hematology ROS (+)   Anesthesia Other Findings Past Medical History: No date: Depression No date: Diabetes mellitus without complication (HCC) 2/56/38: Diabetic peripheral neuropathy ([place]) 03/07/15: Diabetic peripheral neuropathy ([place]) 03/07/15: Diabetic peripheral neuropathy (HCC) No date: GERD (gastroesophageal reflux disease) 03/07/15: Hypercholesterolemia No date: Hypertension 03/07/15: Obesity 03/07/15: Personality disorder 03/07/15: Sinus tachycardia ([place])     Comment: history of 03/07/15: Suicidal thoughts Past Surgical History: 03/07/15: electroconvulsion therapy BMI    Body Mass Index:  36.44 kg/m     Reproductive/Obstetrics                             Anesthesia Physical  Anesthesia Plan  ASA: III  Anesthesia Plan: General   Post-op Pain Management:    Induction: Intravenous  PONV Risk Score and  Plan: 2 and Ondansetron  Airway Management Planned: Mask  Additional Equipment:   Intra-op Plan:   Post-operative Plan:   Informed Consent: I have reviewed the patients History and Physical, chart, labs and discussed the procedure including the risks, benefits and alternatives for the proposed anesthesia with the patient or authorized representative who has indicated his/her understanding and acceptance.     Dental Advisory Given  Plan Discussed with: CRNA and Anesthesiologist  Anesthesia Plan Comments:         Anesthesia Quick Evaluation

## 2020-10-13 NOTE — Transfer of Care (Signed)
Immediate Anesthesia Transfer of Care Note  Patient: Melinda [name]  Procedure(s) Performed: ECT TX  Patient Location: PACU  Anesthesia Type:General  Level of Consciousness: awake  Airway & Oxygen Therapy: Patient connected to face mask oxygen  Post-op Assessment: Post -op Vital signs reviewed and stable  Post vital signs: stable  Last Vitals:  Vitals Value Taken Time  BP 130/86 10/13/20 1407  Temp    Pulse 93 10/13/20 1415  Resp 22 10/13/20 1415  SpO2 98 % 10/13/20 1415  Vitals shown include unvalidated device data.  Last Pain:  Vitals:   10/13/20 1224  TempSrc:   PainSc: 0-No pain         Complications: No complications documented.

## 2020-10-13 NOTE — Procedures (Signed)
ECT SERVICES Physician's Interval Evaluation & Treatment Note  Patient Identification: Melinda [name] MRN:  [id number] Date of Evaluation:  10/13/2020 TX #: [id number]  MADRS:   MMSE:   P.E. Findings:  No change physical exam  Psychiatric Interval Note:  She is feeling pretty down today.  Had an increase in her mortgage payment which is upsetting  Subjective:  Patient is a 49 y.o. female seen for evaluation for Electroconvulsive Therapy. Feeling down talking more about cutting  Treatment Summary:   []   Right Unilateral             [x]  Bilateral   % Energy : 1.0 ms 35%   Impedance: 830 ohms  Seizure Energy Index: 6482 V squared  Postictal Suppression Index: No reading  Seizure Concordance Index: 94%  Medications  Pre Shock: Robinul 0.4 mg Toradol 30 mg Brevital 70 mg succinylcholine 100 mg labetalol 20 mg esmolol 20 mg  Post Shock:    Seizure Duration: EMG 40 seconds EEG 92 seconds   Comments: As usual well-tolerated no complications.  Follow-up 2 weeks  Lungs:  [x]   Clear to auscultation               []  Other:   Heart:    [x]   Regular rhythm             []  irregular rhythm    [x]   Previous H&P reviewed, patient examined and there are NO CHANGES                 []   Previous H&P reviewed, patient examined and there are changes noted.   [name], MD 12/3/20216:11 PM

## 2020-10-13 NOTE — H&P (Signed)
Melinda [name] is an 49 y.o. female.   Chief Complaint: recurrent severe depression HPI: recurrent severe depression  Past Medical History:  Diagnosis Date  . Depression   . Diabetes mellitus without complication ([place])   . Diabetic peripheral neuropathy ([place]) 03/07/15  . Diabetic peripheral neuropathy ([place]) 03/07/15  . Diabetic peripheral neuropathy ([place]) 03/07/15  . GERD (gastroesophageal reflux disease)   . Hypercholesterolemia 03/07/15  . Hypertension   . Obesity 03/07/15  . Personality disorder ([place]) 03/07/15  . Sinus tachycardia 03/07/15   history of  . Suicidal thoughts 03/07/15    Past Surgical History:  Procedure Laterality Date  . COLONOSCOPY WITH PROPOFOL N/A 12/01/2018   Procedure: COLONOSCOPY WITH PROPOFOL;  Surgeon: Jonathon Bellows, MD;  Location: [facility] ENDOSCOPY;  Service: Gastroenterology;  Laterality: N/A;  . electroconvulsion therapy  03/07/15    Family History  Problem Relation Age of Onset  . Hypertension Father   . Diabetes Mother    Social History:  reports that she has never smoked. She has never used smokeless tobacco. She reports that she does not drink alcohol and does not use drugs.  Allergies:  Allergies  Allergen Reactions  . Prednisone     Increases blood sugar    (Not in a hospital admission)   No results found for this or any previous visit (from the past 48 hour(s)). No results found.  Review of Systems  Constitutional: Negative.   HENT: Negative.   Eyes: Negative.   Respiratory: Negative.   Cardiovascular: Negative.   Gastrointestinal: Negative.   Musculoskeletal: Negative.   Skin: Negative.   Neurological: Negative.   Psychiatric/Behavioral: Positive for dysphoric mood. Negative for suicidal ideas.    Blood pressure (!) 148/88, pulse (!) 111, temperature 97.9 F (36.6 C), temperature source Oral, resp. rate 18, height 5\' 4"  (1.626 m), weight 103.6 kg, SpO2 99 %. Physical Exam Nursing note reviewed. Exam conducted with a chaperone  present.  Constitutional:      Appearance: She is well-developed.  HENT:     Head: Normocephalic and atraumatic.  Eyes:     Conjunctiva/sclera: Conjunctivae normal.     Pupils: Pupils are equal, round, and reactive to light.  Cardiovascular:     Heart sounds: Normal heart sounds.  Pulmonary:     Effort: Pulmonary effort is normal.  Abdominal:     Palpations: Abdomen is soft.  Musculoskeletal:        General: Normal range of motion.     Cervical back: Normal range of motion.  Skin:    General: Skin is warm and dry.  Neurological:     General: No focal deficit present.     Mental Status: She is alert.  Psychiatric:        Attention and Perception: Attention normal.        Mood and Affect: Mood is depressed.        Speech: Speech normal.        Behavior: Behavior normal.        Thought Content: Thought content normal.        Cognition and Memory: Cognition normal.        Judgment: Judgment normal.      Assessment/Plan Continue maintenance schedule  [name], MD 10/13/2020, 12:29 PM

## 2020-10-15 NOTE — Anesthesia Postprocedure Evaluation (Signed)
Anesthesia Post Note  Patient: Melinda [name]  Procedure(s) Performed: ECT TX  Patient location during evaluation: PACU Anesthesia Type: General Level of consciousness: awake and alert Pain management: pain level controlled Vital Signs Assessment: post-procedure vital signs reviewed and stable Respiratory status: spontaneous breathing, nonlabored ventilation, respiratory function stable and patient connected to nasal cannula oxygen Cardiovascular status: blood pressure returned to baseline and stable Postop Assessment: no apparent nausea or vomiting Anesthetic complications: no   No complications documented.   Last Vitals:  Vitals:   10/13/20 1422 10/13/20 1430  BP: (!) 139/95 135/90  Pulse: 92 89  Resp: (!) 29 (!) 25  Temp:  36.7 C  SpO2: 96%     Last Pain:  Vitals:   10/13/20 1430  TempSrc: Oral  PainSc: 0-No pain                 Martha Clan

## 2020-10-16 LAB — GLUCOSE, CAPILLARY: Glucose-Capillary: 130 mg/dL — ABNORMAL HIGH (ref 70–99)

## 2020-10-25 ENCOUNTER — Other Ambulatory Visit
Admission: RE | Admit: 2020-10-25 | Discharge: 2020-10-25 | Disposition: A | Discharge status: Home / Self Care | Payer: Medicare PPO | Source: Ambulatory Visit | Attending: Psychiatry | Admitting: Psychiatry

## 2020-10-25 DIAGNOSIS — Z20822 Contact with and (suspected) exposure to covid-19: Secondary | ICD-10-CM | POA: Insufficient documentation

## 2020-10-25 DIAGNOSIS — Z01812 Encounter for preprocedural laboratory examination: Secondary | ICD-10-CM | POA: Diagnosis not present

## 2020-10-26 ENCOUNTER — Other Ambulatory Visit: Payer: Self-pay | Admitting: Psychiatry

## 2020-10-26 LAB — SARS CORONAVIRUS 2 (TAT 6-24 HRS): SARS Coronavirus 2: NEGATIVE

## 2020-10-27 ENCOUNTER — Ambulatory Visit
Admission: RE | Admit: 2020-10-27 | Discharge: 2020-10-27 | Disposition: A | Discharge status: Home / Self Care | Payer: Medicare PPO | Source: Ambulatory Visit | Attending: Psychiatry | Admitting: Psychiatry

## 2020-10-27 ENCOUNTER — Ambulatory Visit: Payer: Self-pay | Admitting: Certified Registered"

## 2020-10-27 ENCOUNTER — Encounter: Payer: Self-pay | Admitting: Certified Registered"

## 2020-10-27 ENCOUNTER — Other Ambulatory Visit: Payer: Self-pay

## 2020-10-27 DIAGNOSIS — E7800 Pure hypercholesterolemia, unspecified: Secondary | ICD-10-CM | POA: Diagnosis not present

## 2020-10-27 DIAGNOSIS — K219 Gastro-esophageal reflux disease without esophagitis: Secondary | ICD-10-CM | POA: Diagnosis not present

## 2020-10-27 DIAGNOSIS — E1140 Type 2 diabetes mellitus with diabetic neuropathy, unspecified: Secondary | ICD-10-CM | POA: Diagnosis not present

## 2020-10-27 DIAGNOSIS — F332 Major depressive disorder, recurrent severe without psychotic features: Secondary | ICD-10-CM | POA: Diagnosis not present

## 2020-10-27 DIAGNOSIS — F32A Depression, unspecified: Secondary | ICD-10-CM | POA: Diagnosis not present

## 2020-10-27 LAB — GLUCOSE, CAPILLARY: Glucose-Capillary: 108 mg/dL — ABNORMAL HIGH (ref 70–99)

## 2020-10-27 MED ORDER — GLYCOPYRROLATE 0.2 MG/ML IJ SOLN: 0.4000 mg | Freq: Once | INTRAMUSCULAR | Status: AC

## 2020-10-27 MED ORDER — SUCCINYLCHOLINE CHLORIDE 20 MG/ML IJ SOLN
INTRAMUSCULAR | Status: DC | PRN
  Administered 2020-10-27: 100 mg via INTRAVENOUS

## 2020-10-27 MED ORDER — FENTANYL CITRATE (PF) 100 MCG/2ML IJ SOLN: 25.0000 ug | INTRAMUSCULAR | Status: DC | PRN

## 2020-10-27 MED ORDER — KETOROLAC TROMETHAMINE 30 MG/ML IJ SOLN
INTRAMUSCULAR | Status: AC
  Administered 2020-10-27: 30 mg via INTRAVENOUS
  Filled 2020-10-27: qty 1

## 2020-10-27 MED ORDER — KETOROLAC TROMETHAMINE 30 MG/ML IJ SOLN: 30.0000 mg | Freq: Once | INTRAMUSCULAR | Status: AC

## 2020-10-27 MED ORDER — ONDANSETRON HCL 4 MG/2ML IJ SOLN: 4.0000 mg | Freq: Once | INTRAMUSCULAR | Status: DC | PRN

## 2020-10-27 MED ORDER — GLYCOPYRROLATE 0.2 MG/ML IJ SOLN
INTRAMUSCULAR | Status: AC
  Administered 2020-10-27: 0.4 mg via INTRAVENOUS
  Filled 2020-10-27: qty 2

## 2020-10-27 MED ORDER — SODIUM CHLORIDE 0.9 % IV SOLN: INTRAVENOUS | Status: DC | PRN

## 2020-10-27 MED ORDER — METHOHEXITAL SODIUM 100 MG/10ML IV SOSY
PREFILLED_SYRINGE | INTRAVENOUS | Status: DC | PRN
  Administered 2020-10-27: 70 mg via INTRAVENOUS

## 2020-10-27 MED ORDER — ESMOLOL HCL 100 MG/10ML IV SOLN
INTRAVENOUS | Status: DC | PRN
  Administered 2020-10-27: 20 mg via INTRAVENOUS

## 2020-10-27 MED ORDER — SODIUM CHLORIDE 0.9 % IV SOLN
500.0000 mL | Freq: Once | INTRAVENOUS | Status: AC
  Administered 2020-10-27: 500 mL via INTRAVENOUS

## 2020-10-27 MED ORDER — LABETALOL HCL 5 MG/ML IV SOLN
INTRAVENOUS | Status: DC | PRN
  Administered 2020-10-27: 30 mg via INTRAVENOUS

## 2020-10-27 NOTE — Transfer of Care (Signed)
Immediate Anesthesia Transfer of Care Note  Patient: Melinda [name]  Procedure(s) Performed: ECT TX  Patient Location: PACU  Anesthesia Type:General  Level of Consciousness: awake, drowsy and patient cooperative  Airway & Oxygen Therapy: Patient Spontanous Breathing and Patient connected to face mask oxygen  Post-op Assessment: Report given to RN and Post -op Vital signs reviewed and stable  Post vital signs: Reviewed and stable  Last Vitals:  Vitals Value Taken Time  BP    Temp    Pulse 103 10/27/20 1417  Resp 18 10/27/20 1417  SpO2 99 % 10/27/20 1417  Vitals shown include unvalidated device data.  Last Pain:  Vitals:   10/27/20 1114  TempSrc:   PainSc: 0-No pain         Complications: No complications documented.

## 2020-10-27 NOTE — Anesthesia Preprocedure Evaluation (Signed)
Anesthesia Evaluation  Patient identified by MRN, date of birth, ID band Patient awake    Reviewed: Allergy & Precautions, H&P , NPO status , Patient's Chart, lab work & pertinent test results  History of Anesthesia Complications Negative for: history of anesthetic complications  Airway Mallampati: II  TM Distance: >3 FB Neck ROM: full    Dental  (+) Poor Dentition, Chipped   Pulmonary sleep apnea , neg COPD,    Pulmonary exam normal breath sounds clear to auscultation       Cardiovascular hypertension, Pt. on medications (-) CAD and (-) Past MI negative cardio ROS Normal cardiovascular exam Rhythm:regular Rate:Normal     Neuro/Psych PSYCHIATRIC DISORDERS Depression Bipolar Disorder  Neuromuscular disease negative neurological ROS     GI/Hepatic negative GI ROS, Neg liver ROS, GERD  Medicated,  Endo/Other  diabetes, Type 2, Oral Hypoglycemic Agents  Renal/GU negative Renal ROS  negative genitourinary   Musculoskeletal   Abdominal (+) + obese,   Peds  Hematology negative hematology ROS (+)   Anesthesia Other Findings Past Medical History: No date: Depression No date: Diabetes mellitus without complication (HCC) 2/56/38: Diabetic peripheral neuropathy ([place]) 03/07/15: Diabetic peripheral neuropathy ([place]) 03/07/15: Diabetic peripheral neuropathy (HCC) No date: GERD (gastroesophageal reflux disease) 03/07/15: Hypercholesterolemia No date: Hypertension 03/07/15: Obesity 03/07/15: Personality disorder 03/07/15: Sinus tachycardia ([place])     Comment: history of 03/07/15: Suicidal thoughts Past Surgical History: 03/07/15: electroconvulsion therapy BMI    Body Mass Index:  36.44 kg/m     Reproductive/Obstetrics                             Anesthesia Physical  Anesthesia Plan  ASA: III  Anesthesia Plan: General   Post-op Pain Management:    Induction: Intravenous  PONV Risk Score and  Plan: 2 and Ondansetron  Airway Management Planned: Mask  Additional Equipment:   Intra-op Plan:   Post-operative Plan:   Informed Consent: I have reviewed the patients History and Physical, chart, labs and discussed the procedure including the risks, benefits and alternatives for the proposed anesthesia with the patient or authorized representative who has indicated his/her understanding and acceptance.     Dental Advisory Given  Plan Discussed with: CRNA and Anesthesiologist  Anesthesia Plan Comments:         Anesthesia Quick Evaluation

## 2020-10-30 NOTE — Anesthesia Postprocedure Evaluation (Signed)
Anesthesia Post Note  Patient: Melinda [name]  Procedure(s) Performed: ECT TX  Patient location during evaluation: PACU Anesthesia Type: General Level of consciousness: awake and alert and oriented Pain management: pain level controlled Vital Signs Assessment: post-procedure vital signs reviewed and stable Respiratory status: spontaneous breathing Cardiovascular status: blood pressure returned to baseline Anesthetic complications: no   No complications documented.   Last Vitals:  Vitals:   10/27/20 1446 10/27/20 1500  BP: 129/69 128/68  Pulse: 94   Resp: 18 18  Temp: 36.9 C 36.9 C  SpO2: 99%     Last Pain:  Vitals:   10/27/20 1500  TempSrc: Oral  PainSc: 0-No pain                 Melinda [name]

## 2020-11-11 ENCOUNTER — Other Ambulatory Visit: Payer: Self-pay | Admitting: Psychiatry

## 2020-11-15 ENCOUNTER — Telehealth: Payer: Self-pay

## 2020-11-15 ENCOUNTER — Other Ambulatory Visit: Payer: Self-pay

## 2020-11-15 ENCOUNTER — Other Ambulatory Visit
Admission: RE | Admit: 2020-11-15 | Discharge: 2020-11-15 | Disposition: A | Discharge status: Home / Self Care | Payer: Medicare PPO | Source: Ambulatory Visit | Attending: Psychiatry | Admitting: Psychiatry

## 2020-11-15 DIAGNOSIS — Z20822 Contact with and (suspected) exposure to covid-19: Secondary | ICD-10-CM | POA: Insufficient documentation

## 2020-11-15 DIAGNOSIS — Z01812 Encounter for preprocedural laboratory examination: Secondary | ICD-10-CM | POA: Diagnosis not present

## 2020-11-15 NOTE — Progress Notes (Signed)
Established patient visit   Patient: Melinda [name]   DOB: July 03, 1971   50 y.o. Female  MRN: [id number] Visit Date: 11/16/2020  Today's healthcare provider: Trinna Post, PA-C   Chief Complaint  Patient presents with  . Diabetes  . Hypertension  . Hyperlipidemia  I,Porsha C McClurkin,acting as a scribe for Trinna Post, PA-C.,have documented all relevant documentation on the behalf of Trinna Post, PA-C,as directed by  Trinna Post, PA-C while in the presence of Trinna Post, PA-C.  Subjective    HPI   Patient presents for physical and follow up.   She is also having some left shoulder pain ongoing for two weeks. She was laying down when she noticed it the fist time. The pain is intermittent but happens frequently. If she tries to reach behind her back when she is washing it in the shower it will worsen the pain. She denies injuries injuries. This is not her dominant hand.    Diabetes Mellitus Type II, follow-up   Lab Results  Component Value Date   HGBA1C 6.2 (A) 05/16/2020   HGBA1C 6.3 (A) 01/12/2020   HGBA1C 6.9 (H) 09/14/2019   Last seen for diabetes 6 months ago.  Management since then includes continuing the same treatment. She reports good compliance with treatment. She is not having side effects.   Home blood sugar records: fasting range: 100's-110's  Episodes of hypoglycemia? No    Current insulin regiment:None Most Recent Eye Exam: 02/21/2020  --------------------------------------------------------------------------------------------------- Hypertension, follow-up  BP Readings from Last 3 Encounters:  11/16/20 (!) 124/94  05/16/20 128/86  01/12/20 132/82   Wt Readings from Last 3 Encounters:  11/16/20 229 lb 8 oz (104.1 kg)  05/16/20 228 lb 6.4 oz (103.6 kg)  01/12/20 229 lb 9.6 oz (104.1 kg)     She was last seen for hypertension 6 months ago.  BP at that visit was 128/86. Management since that visit includes continue  current medication. She reports good compliance with treatment. She is not having side effects.  She is not exercising. She is adherent to low salt diet.   Outside blood pressures are not being checked.  She does not smoke.  Use of agents associated with hypertension: none.   --------------------------------------------------------------------------------------------------- Lipid/Cholesterol, follow-up  Last Lipid Panel: Lab Results  Component Value Date   CHOL 133 05/16/2020   LDLCALC 68 05/16/2020   HDL 50 05/16/2020   TRIG 74 05/16/2020    She was last seen for this 6 months ago.  Management since that visit includes continue current medication.  She reports good compliance with treatment. She is not having side effects.   Symptoms: No appetite changes No foot ulcerations  No chest pain No chest pressure/discomfort  No dyspnea No orthopnea  No fatigue No lower extremity edema  No palpitations No paroxysmal nocturnal dyspnea  No nausea No numbness or tingling of extremity  No polydipsia No polyuria  No speech difficulty No syncope   She is following a Regular diet. Current exercise: housecleaning and no regular exercise  Last metabolic panel Lab Results  Component Value Date   GLUCOSE 102 (H) 05/16/2020   NA 140 05/16/2020   K 3.9 05/16/2020   BUN 13 05/16/2020   CREATININE 0.86 05/16/2020   GFRNONAA 78 05/16/2020   GFRAA 90 05/16/2020   CALCIUM 9.5 05/16/2020   AST 10 05/16/2020   ALT 7 05/16/2020   The 10-year ASCVD risk score Mikey Bussing DC Brooke Bonito., et al.,  2013) is: 6%  ---------------------------------------------------------------------------------------------------     Medications: Outpatient Medications Prior to Visit  Medication Sig  . Accu-Chek Softclix Lancets lancets USE AS DIRECTED  . Alcohol Swabs (B-D SINGLE USE SWABS REGULAR) PADS USE AS DIRECTED  . atorvastatin (LIPITOR) 10 MG tablet TAKE 1 TABLET AT BEDTIME  . buPROPion (WELLBUTRIN XL) 300  MG 24 hr tablet TAKE 1 TABLET EVERY DAY  . buPROPion (WELLBUTRIN XL) 300 MG 24 hr tablet Take 1 tablet (300 mg total) by mouth daily.  Marland Kitchen escitalopram (LEXAPRO) 20 MG tablet TAKE 1 TABLET EVERY DAY  . escitalopram (LEXAPRO) 20 MG tablet Take 1 tablet (20 mg total) by mouth daily.  Marland Kitchen glucose blood (ACCU-CHEK AVIVA PLUS) test strip CHECK FASTING BLOOD SUGARS EVERY MORNING  . Lancets Misc. (ACCU-CHEK FASTCLIX LANCET) KIT   . lisinopril (ZESTRIL) 10 MG tablet TAKE 1 TABLET (10 MG TOTAL) BY MOUTH DAILY.  . metFORMIN (GLUCOPHAGE-XR) 500 MG 24 hr tablet TAKE 1 TABLET TWICE DAILY  . ziprasidone (GEODON) 80 MG capsule TAKE 1 CAPSULE TWO TIMES DAILY WITH A MEAL.  . ziprasidone (GEODON) 80 MG capsule Twice a day   No facility-administered medications prior to visit.    Review of Systems  Constitutional: Negative.   Respiratory: Negative.   Cardiovascular: Negative.   Hematological: Negative.       Objective    BP (!) 124/94 (BP Location: Right Arm, Patient Position: Sitting, Cuff Size: Large)   Pulse 88   Temp 98.1 F (36.7 C) (Oral)   Wt 229 lb 8 oz (104.1 kg)   LMP  (LMP Unknown) Comment: pt states she has not had period in the past year  SpO2 99%   BMI 39.39 kg/m    Physical Exam Constitutional:      Appearance: Normal appearance. She is obese.  Cardiovascular:     Rate and Rhythm: Normal rate and regular rhythm.     Pulses: Normal pulses.     Heart sounds: Normal heart sounds.  Pulmonary:     Effort: Pulmonary effort is normal.     Breath sounds: Normal breath sounds.  Abdominal:     General: Bowel sounds are normal.     Palpations: Abdomen is soft.  Musculoskeletal:     Right shoulder: Normal.     Left shoulder: Tenderness present. Decreased range of motion.  Skin:    General: Skin is warm and dry.  Neurological:     General: No focal deficit present.     Mental Status: She is alert and oriented to person, place, and time.  Psychiatric:        Mood and Affect: Mood  normal.        Behavior: Behavior normal.       No results found for any visits on 11/16/20.  Assessment & Plan    1. Annual physical exam   2. Encounter for screening mammogram for malignant neoplasm of breast  - MM Digital Screening; Future  3. Need for influenza vaccination  - Flu Vaccine QUAD 36+ mos IM  4. Controlled type 2 diabetes mellitus without complication, without long-term current use of insulin (HCC)  - POCT glycosylated hemoglobin (Hb A1C)  5. Need for COVID-19 vaccine  Updated today with [employer]. Follow up in 3 weeks.  6. Acute pain of left shoulder  Suspect impingement syndrome, may be due to overuse, arthritis, etc. Treat as below, f/u if worsening.  - meloxicam (MOBIC) 7.5 MG tablet; Take 1 tablet (7.5 mg total) by mouth daily.  Dispense: 30 tablet; Refill: 0  7. Cardiomyopathy, unspecified type ([place])   8. Major depressive disorder, recurrent severe without psychotic features ([place])  Followed by Dr. Weber Cooks, gets routine ECT treatments.   9. Hypertension associated with diabetes ([place])   10. Morbid obesity ([place])  Discussed importance of healthy weight management Discussed diet and exercise  11. Encounter for immunization  - [employer] Vaccine   No follow-ups on file.      ITrinna Post, PA-C, have reviewed all documentation for this visit. The documentation on 11/16/20 for the exam, diagnosis, procedures, and orders are all accurate and complete.  The entirety of the information documented in the History of Present Illness, Review of Systems and Physical Exam were personally obtained by me. Portions of this information were initially documented by [facility] and reviewed by me for thoroughness and accuracy.     Paulene Floor  [facility] [phone] (phone) [phone] (fax)  [place]

## 2020-11-15 NOTE — Telephone Encounter (Signed)
Copied from CRM [id number]. Topic: General - Other >> Nov 15, 2020  1:10 PM Jaquita Rector A wrote: Reason for CRM: Patient called in to inform Osvaldo Angst that she would like to get her first Covid vaccine when she come in for her visit in the morning. Please advise

## 2020-11-16 ENCOUNTER — Ambulatory Visit: Payer: Medicare PPO | Admitting: Physician Assistant

## 2020-11-16 ENCOUNTER — Encounter: Payer: Self-pay | Admitting: Physician Assistant

## 2020-11-16 ENCOUNTER — Other Ambulatory Visit: Payer: Self-pay | Admitting: Psychiatry

## 2020-11-16 VITALS — BP 124/94 | HR 88 | Temp 98.1°F | Wt 229.5 lb

## 2020-11-16 DIAGNOSIS — I152 Hypertension secondary to endocrine disorders: Secondary | ICD-10-CM

## 2020-11-16 DIAGNOSIS — E119 Type 2 diabetes mellitus without complications: Secondary | ICD-10-CM | POA: Diagnosis not present

## 2020-11-16 DIAGNOSIS — E1159 Type 2 diabetes mellitus with other circulatory complications: Secondary | ICD-10-CM | POA: Diagnosis not present

## 2020-11-16 DIAGNOSIS — Z0000 Encounter for general adult medical examination without abnormal findings: Secondary | ICD-10-CM

## 2020-11-16 DIAGNOSIS — Z23 Encounter for immunization: Secondary | ICD-10-CM

## 2020-11-16 DIAGNOSIS — I429 Cardiomyopathy, unspecified: Secondary | ICD-10-CM

## 2020-11-16 DIAGNOSIS — Z1231 Encounter for screening mammogram for malignant neoplasm of breast: Secondary | ICD-10-CM | POA: Diagnosis not present

## 2020-11-16 DIAGNOSIS — M25512 Pain in left shoulder: Secondary | ICD-10-CM

## 2020-11-16 DIAGNOSIS — F332 Major depressive disorder, recurrent severe without psychotic features: Secondary | ICD-10-CM

## 2020-11-16 LAB — POCT GLYCOSYLATED HEMOGLOBIN (HGB A1C)
Est. average glucose Bld gHb Est-mCnc: 143
Hemoglobin A1C: 6.6 % — AB (ref 4.0–5.6)

## 2020-11-16 LAB — SARS CORONAVIRUS 2 (TAT 6-24 HRS): SARS Coronavirus 2: NEGATIVE

## 2020-11-16 MED ORDER — MELOXICAM 7.5 MG PO TABS: 7.5000 mg | ORAL_TABLET | Freq: Every day | ORAL | 0 refills | Status: DC

## 2020-11-17 ENCOUNTER — Encounter: Payer: Self-pay | Admitting: Anesthesiology

## 2020-11-17 ENCOUNTER — Other Ambulatory Visit: Payer: Self-pay

## 2020-11-17 ENCOUNTER — Encounter: Payer: Self-pay | Admitting: Psychiatry

## 2020-11-17 ENCOUNTER — Ambulatory Visit: Payer: Self-pay | Admitting: Anesthesiology

## 2020-11-17 ENCOUNTER — Ambulatory Visit
Admission: RE | Admit: 2020-11-17 | Discharge: 2020-11-17 | Disposition: A | Discharge status: Home / Self Care | Payer: Medicare PPO | Source: Ambulatory Visit | Attending: Psychiatry | Admitting: Psychiatry

## 2020-11-17 DIAGNOSIS — F332 Major depressive disorder, recurrent severe without psychotic features: Secondary | ICD-10-CM | POA: Insufficient documentation

## 2020-11-17 DIAGNOSIS — K219 Gastro-esophageal reflux disease without esophagitis: Secondary | ICD-10-CM | POA: Diagnosis not present

## 2020-11-17 DIAGNOSIS — E1140 Type 2 diabetes mellitus with diabetic neuropathy, unspecified: Secondary | ICD-10-CM | POA: Diagnosis not present

## 2020-11-17 DIAGNOSIS — I10 Essential (primary) hypertension: Secondary | ICD-10-CM | POA: Diagnosis not present

## 2020-11-17 LAB — GLUCOSE, CAPILLARY: Glucose-Capillary: 116 mg/dL — ABNORMAL HIGH (ref 70–99)

## 2020-11-17 MED ORDER — GLYCOPYRROLATE 0.2 MG/ML IJ SOLN
INTRAMUSCULAR | Status: AC
  Administered 2020-11-17: 0.4 mg via INTRAVENOUS
  Filled 2020-11-17: qty 2

## 2020-11-17 MED ORDER — KETOROLAC TROMETHAMINE 30 MG/ML IJ SOLN: 30.0000 mg | Freq: Once | INTRAMUSCULAR | Status: AC

## 2020-11-17 MED ORDER — SODIUM CHLORIDE 0.9 % IV SOLN
500.0000 mL | Freq: Once | INTRAVENOUS | Status: AC
  Administered 2020-11-17: 500 mL via INTRAVENOUS

## 2020-11-17 MED ORDER — LABETALOL HCL 5 MG/ML IV SOLN
INTRAVENOUS | Status: DC | PRN
  Administered 2020-11-17: 10 mg via INTRAVENOUS

## 2020-11-17 MED ORDER — SUCCINYLCHOLINE CHLORIDE 20 MG/ML IJ SOLN
INTRAMUSCULAR | Status: DC | PRN
  Administered 2020-11-17: 100 mg via INTRAVENOUS

## 2020-11-17 MED ORDER — KETOROLAC TROMETHAMINE 30 MG/ML IJ SOLN
INTRAMUSCULAR | Status: AC
  Administered 2020-11-17: 30 mg via INTRAVENOUS
  Filled 2020-11-17: qty 1

## 2020-11-17 MED ORDER — METHOHEXITAL SODIUM 100 MG/10ML IV SOSY
PREFILLED_SYRINGE | INTRAVENOUS | Status: DC | PRN
  Administered 2020-11-17: 70 mg via INTRAVENOUS

## 2020-11-17 MED ORDER — ESMOLOL HCL 100 MG/10ML IV SOLN
INTRAVENOUS | Status: DC | PRN
  Administered 2020-11-17: 20 mg via INTRAVENOUS

## 2020-11-17 MED ORDER — SODIUM CHLORIDE 0.9 % IV SOLN: INTRAVENOUS | Status: DC | PRN

## 2020-11-17 MED ORDER — SUCCINYLCHOLINE CHLORIDE 200 MG/10ML IV SOSY
PREFILLED_SYRINGE | INTRAVENOUS | Status: AC
  Filled 2020-11-17: qty 10

## 2020-11-17 MED ORDER — GLYCOPYRROLATE 0.2 MG/ML IJ SOLN: 0.4000 mg | Freq: Once | INTRAMUSCULAR | Status: AC

## 2020-11-17 NOTE — Anesthesia Preprocedure Evaluation (Signed)
Anesthesia Evaluation  Patient identified by MRN, date of birth, ID band Patient awake    Reviewed: Allergy & Precautions, H&P , NPO status , Patient's Chart, lab work & pertinent test results  History of Anesthesia Complications Negative for: history of anesthetic complications  Airway Mallampati: II  TM Distance: >3 FB Neck ROM: full    Dental  (+) Poor Dentition, Chipped   Pulmonary sleep apnea , neg COPD,    Pulmonary exam normal breath sounds clear to auscultation       Cardiovascular hypertension, Pt. on medications (-) CAD and (-) Past MI negative cardio ROS Normal cardiovascular exam Rhythm:regular Rate:Normal     Neuro/Psych PSYCHIATRIC DISORDERS Depression Bipolar Disorder  Neuromuscular disease negative neurological ROS     GI/Hepatic negative GI ROS, Neg liver ROS, GERD  Medicated,  Endo/Other  diabetes, Type 2, Oral Hypoglycemic Agents  Renal/GU negative Renal ROS  negative genitourinary   Musculoskeletal   Abdominal (+) + obese,   Peds  Hematology negative hematology ROS (+)   Anesthesia Other Findings Past Medical History: No date: Depression No date: Diabetes mellitus without complication (HCC) 2/56/38: Diabetic peripheral neuropathy ([place]) 03/07/15: Diabetic peripheral neuropathy ([place]) 03/07/15: Diabetic peripheral neuropathy (HCC) No date: GERD (gastroesophageal reflux disease) 03/07/15: Hypercholesterolemia No date: Hypertension 03/07/15: Obesity 03/07/15: Personality disorder 03/07/15: Sinus tachycardia ([place])     Comment: history of 03/07/15: Suicidal thoughts Past Surgical History: 03/07/15: electroconvulsion therapy BMI    Body Mass Index:  36.44 kg/m     Reproductive/Obstetrics                             Anesthesia Physical  Anesthesia Plan  ASA: III  Anesthesia Plan: General   Post-op Pain Management:    Induction: Intravenous  PONV Risk Score and  Plan: 2 and Ondansetron  Airway Management Planned: Mask  Additional Equipment:   Intra-op Plan:   Post-operative Plan:   Informed Consent: I have reviewed the patients History and Physical, chart, labs and discussed the procedure including the risks, benefits and alternatives for the proposed anesthesia with the patient or authorized representative who has indicated his/her understanding and acceptance.     Dental Advisory Given  Plan Discussed with: CRNA and Anesthesiologist  Anesthesia Plan Comments:         Anesthesia Quick Evaluation

## 2020-11-17 NOTE — Transfer of Care (Signed)
Immediate Anesthesia Transfer of Care Note  Patient: Melinda [name]  Procedure(s) Performed: ECT TX  Patient Location: PACU  Anesthesia Type:General  Level of Consciousness: awake, drowsy and patient cooperative  Airway & Oxygen Therapy: Patient Spontanous Breathing and Patient connected to face mask oxygen  Post-op Assessment: Report given to RN and Post -op Vital signs reviewed and stable  Post vital signs: Reviewed and stable  Last Vitals:  Vitals Value Taken Time  BP    Temp    Pulse    Resp    SpO2      Last Pain:  Vitals:   11/17/20 1226  TempSrc:   PainSc: 0-No pain         Complications: No complications documented.

## 2020-11-18 NOTE — Anesthesia Postprocedure Evaluation (Signed)
Anesthesia Post Note  Patient: Melinda [name]  Procedure(s) Performed: ECT TX  Patient location during evaluation: PACU Anesthesia Type: General Level of consciousness: awake and alert Pain management: pain level controlled Vital Signs Assessment: post-procedure vital signs reviewed and stable Respiratory status: spontaneous breathing, nonlabored ventilation, respiratory function stable and patient connected to nasal cannula oxygen Cardiovascular status: blood pressure returned to baseline and stable Postop Assessment: no apparent nausea or vomiting Anesthetic complications: no   No complications documented.   Last Vitals:  Vitals:   11/17/20 1400 11/17/20 1422  BP: 138/86 135/85  Pulse: (!) 103 (!) 102  Resp: 16 18  Temp:  36.9 C  SpO2: 96%     Last Pain:  Vitals:   11/17/20 1422  TempSrc: Oral  PainSc: 0-No pain                 Martha Clan

## 2020-11-20 ENCOUNTER — Other Ambulatory Visit: Payer: Self-pay | Admitting: Psychiatry

## 2020-11-22 ENCOUNTER — Other Ambulatory Visit: Payer: Self-pay | Admitting: Physician Assistant

## 2020-11-23 ENCOUNTER — Telehealth: Payer: Self-pay | Admitting: *Deleted

## 2020-11-23 ENCOUNTER — Other Ambulatory Visit: Payer: Self-pay | Admitting: Psychiatry

## 2020-11-23 MED ORDER — BUPROPION HCL ER (XL) 300 MG PO TB24: 300.0000 mg | ORAL_TABLET | Freq: Every day | ORAL | 1 refills | Status: DC

## 2020-11-23 MED ORDER — ESCITALOPRAM OXALATE 20 MG PO TABS: 20.0000 mg | ORAL_TABLET | Freq: Every day | ORAL | 1 refills | Status: DC

## 2020-11-23 NOTE — Telephone Encounter (Signed)
Patient needs refills on both strengths of Wellbutrin.

## 2020-11-24 ENCOUNTER — Other Ambulatory Visit: Payer: Self-pay | Admitting: Physician Assistant

## 2020-11-24 DIAGNOSIS — I10 Essential (primary) hypertension: Secondary | ICD-10-CM

## 2020-11-27 NOTE — Progress Notes (Signed)
Subjective:   Melinda [name] is a 50 y.o. female who presents for Medicare Annual (Subsequent) preventive examination.  I connected with Dafne Uber today by telephone and verified that I am speaking with the correct person using two identifiers. Location patient: home Location provider: work Persons participating in the virtual visit: patient, provider.   I discussed the limitations, risks, security and privacy concerns of performing an evaluation and management service by telephone and the availability of in person appointments. I also discussed with the patient that there may be a patient responsible charge related to this service. The patient expressed understanding and verbally consented to this telephonic visit.    Interactive audio and video telecommunications were attempted between this provider and patient, however failed, due to patient having technical difficulties OR patient did not have access to video capability.  We continued and completed visit with audio only.  Review of Systems    N/A  Cardiac Risk Factors include: diabetes mellitus;dyslipidemia;hypertension     Objective:    There were no vitals filed for this visit. There is no height or weight on file to calculate BMI.  Advanced Directives 11/28/2020 12/01/2018  Does Patient Have a Medical Advance Directive? No No  Would patient like information on creating a medical advance directive? No - Patient declined No - Patient declined    Current Medications (verified) Outpatient Encounter Medications as of 11/28/2020  Medication Sig  . Accu-Chek Softclix Lancets lancets USE AS DIRECTED  . Alcohol Swabs (B-D SINGLE USE SWABS REGULAR) PADS USE AS DIRECTED  . atorvastatin (LIPITOR) 10 MG tablet TAKE 1 TABLET AT BEDTIME  . Blood Glucose Monitoring Suppl (TRUE METRIX METER) w/Device KIT USE AS DIRECTED AS NEEDED  FOR  DIABETES  MELLITUS  TYPE 2  . buPROPion (WELLBUTRIN XL) 300 MG 24 hr tablet Take 1 tablet (300 mg  total) by mouth daily.  Marland Kitchen escitalopram (LEXAPRO) 20 MG tablet TAKE 1 TABLET EVERY DAY  . escitalopram (LEXAPRO) 20 MG tablet Take 1 tablet (20 mg total) by mouth daily.  Marland Kitchen glucose blood (ACCU-CHEK AVIVA PLUS) test strip CHECK FASTING BLOOD SUGARS EVERY MORNING  . Lancets Misc. (ACCU-CHEK FASTCLIX LANCET) KIT   . lisinopril (ZESTRIL) 10 MG tablet TAKE 1 TABLET EVERY DAY  . meloxicam (MOBIC) 7.5 MG tablet Take 1 tablet (7.5 mg total) by mouth daily.  . metFORMIN (GLUCOPHAGE-XR) 500 MG 24 hr tablet TAKE 1 TABLET TWICE DAILY  . ziprasidone (GEODON) 80 MG capsule TAKE 1 CAPSULE TWO TIMES DAILY WITH A MEAL.  . ziprasidone (GEODON) 80 MG capsule Twice a day  . buPROPion (WELLBUTRIN XL) 300 MG 24 hr tablet TAKE 1 TABLET EVERY DAY (Patient not taking: Reported on 11/28/2020)   No facility-administered encounter medications on file as of 11/28/2020.    Allergies (verified) Prednisone   History: Past Medical History:  Diagnosis Date  . Depression   . Diabetes mellitus without complication ([place])   . Diabetic peripheral neuropathy ([place]) 03/07/15  . Diabetic peripheral neuropathy ([place]) 03/07/15  . Diabetic peripheral neuropathy ([place]) 03/07/15  . GERD (gastroesophageal reflux disease)   . Hypercholesterolemia 03/07/15  . Hypertension   . Obesity 03/07/15  . Personality disorder ([place]) 03/07/15  . Sinus tachycardia 03/07/15   history of  . Suicidal thoughts 03/07/15   Past Surgical History:  Procedure Laterality Date  . COLONOSCOPY WITH PROPOFOL N/A 12/01/2018   Procedure: COLONOSCOPY WITH PROPOFOL;  Surgeon: Jonathon Bellows, MD;  Location: [facility] ENDOSCOPY;  Service: Gastroenterology;  Laterality: N/A;  .  electroconvulsion therapy  03/07/15   Family History  Problem Relation Age of Onset  . Hypertension Father   . Diabetes Mother    Social History   Socioeconomic History  . Marital status: Single    Spouse name: Not on file  . Number of children: 1  . Years of education: Network engineer degree  .  Highest education level: Some college, no degree  Occupational History  . Occupation: Disabled  Tobacco Use  . Smoking status: Never Smoker  . Smokeless tobacco: Never Used  Vaping Use  . Vaping Use: Never used  Substance and Sexual Activity  . Alcohol use: No  . Drug use: No  . Sexual activity: Not Currently    Birth control/protection: Abstinence  Other Topics Concern  . Not on file  Social History Narrative  . Not on file   Social Determinants of Health   Financial Resource Strain: Medium Risk  . Difficulty of Paying Living Expenses: Somewhat hard  Food Insecurity: No Food Insecurity  . Worried About Charity fundraiser in the Last Year: Never true  . Ran Out of Food in the Last Year: Never true  Transportation Needs: Unmet Transportation Needs  . Lack of Transportation (Medical): No  . Lack of Transportation (Non-Medical): Yes  Physical Activity: Inactive  . Days of Exercise per Week: 0 days  . Minutes of Exercise per Session: 0 min  Stress: No Stress Concern Present  . Feeling of Stress : Not at all  Social Connections: Socially Isolated  . Frequency of Communication with Friends and Family: Twice a week  . Frequency of Social Gatherings with Friends and Family: Twice a week  . Attends Religious Services: Never  . Active Member of Clubs or Organizations: No  . Attends Archivist Meetings: Never  . Marital Status: Never married    Tobacco Counseling Counseling given: Not Answered   Clinical Intake:  Pre-visit preparation completed: Yes  Pain : No/denies pain     Nutritional Risks: None Diabetes: Yes  How often do you need to have someone help you when you read instructions, pamphlets, or other written materials from your doctor or pharmacy?: 1 - Never  Diabetic? Yes  Nutrition Risk Assessment:  Has the patient had any N/V/D within the last 2 months?  No  Does the patient have any non-healing wounds?  No  Has the patient had any  unintentional weight loss or weight gain?  No   Diabetes:  Is the patient diabetic?  Yes  If diabetic, was a CBG obtained today?  No  Did the patient bring in their glucometer from home?  No  How often do you monitor your CBG's? Once a day in the morning.   Financial Strains and Diabetes Management:  Are you having any financial strains with the device, your supplies or your medication? No .  Does the patient want to be seen by Chronic Care Management for management of their diabetes?  No  Would the patient like to be referred to a Nutritionist or for Diabetic Management?  No   Diabetic Exams:  Diabetic Eye Exam: Completed 02/21/20 Diabetic Foot Exam: Completed 11/16/20   Interpreter Needed?: No  Information entered by :: [facility], LPN   Activities of Daily Living In your present state of health, do you have any difficulty performing the following activities: 11/28/2020  Hearing? N  Vision? N  Difficulty concentrating or making decisions? N  Walking or climbing stairs? Y  Comment Due to SOB.  Dressing or bathing? N  Doing errands, shopping? Y  Comment Does not drive.  Preparing Food and eating ? N  Using the Toilet? N  In the past six months, have you accidently leaked urine? N  Do you have problems with loss of bowel control? N  Managing your Medications? N  Managing your Finances? N  Housekeeping or managing your Housekeeping? N  Some recent data might be hidden    Patient Care Team: Paulene Floor as PCP - General (Physician Assistant) System, Provider Not In (Ophthalmology) Clapacs, Madie Reno, MD (Psychiatry)  Indicate any recent Medical Services you may have received from other than [facility] providers in the past year (date may be approximate).     Assessment:   This is a routine wellness examination for Mignon.  Hearing/Vision screen No exam data present  Dietary issues and exercise activities discussed: Current Exercise Habits: The patient does not  participate in regular exercise at present, Exercise limited by: orthopedic condition(s)  Goals    . Prevent falls     Recommend to remove any items from the home that may cause slips or trips.      Depression Screen PHQ 2/9 Scores 11/28/2020 05/13/2019 11/12/2018 10/06/2018 06/14/2015  PHQ - 2 Score _0 -  PHQ- 9 Score _1 -  Exception Documentation - - - - Other- indicate reason in comment box  Not completed - - - - Pt has severe depression; has weekly ECT Treatments, and being followed very closely by a psychiatry.    Fall Risk Fall Risk  11/28/2020 11/12/2018 10/06/2018 06/14/2015  Falls in the past year? 1 0 0 No  Number falls in past yr: 0 0 0 -  Injury with Fall? 0 0 0 -  Risk for fall due to : No Fall Risks - - -  Follow up Falls prevention discussed - - -    FALL RISK PREVENTION PERTAINING TO THE HOME:  Any stairs in or around the home? Yes  If so, are there any without handrails? No  Home free of loose throw rugs in walkways, pet beds, electrical cords, etc? Yes  Adequate lighting in your home to reduce risk of falls? Yes   ASSISTIVE DEVICES UTILIZED TO PREVENT FALLS:  Life alert? No  Use of a cane, walker or w/c? No  Grab bars in the bathroom? No  Shower chair or bench in shower? No  Elevated toilet seat or a handicapped toilet? No    Cognitive Function: Normal cognitive status assessed by observation by this Nurse Health Advisor. No abnormalities found.   MMSE - Mini Mental State Exam 12/22/2015  Recall-comments " I can't remember"  Language- follow 3 step command-comments pt did not fold paper  Some encounter information is confidential and restricted. Go to Review Flowsheets activity to see all data.        Immunizations Immunization History  Administered Date(s) Administered  . Hepatitis B 12/16/2013  . Influenza,inj,Quad PF,6+ Mos 07/19/2013, 10/06/2018, 11/16/2020  . PFIZER(Purple Top)SARS-COV-2 Vaccination 11/16/2020  . Pneumococcal  Polysaccharide-23 12/16/2013    TDAP status: Due, Education has been provided regarding the importance of this vaccine. Advised may receive this vaccine at local pharmacy or Health Dept. Aware to provide a copy of the vaccination record if obtained from local pharmacy or Health Dept. Verbalized acceptance and understanding.  Flu Vaccine status: Up to date  Covid-19 vaccine status: Up to date, second dose due 12/08/19.  Qualifies for Shingles Vaccine?  Yes   Zostavax completed No   Shingrix Completed?: No.    Education has been provided regarding the importance of this vaccine. Patient has been advised to call insurance company to determine out of pocket expense if they have not yet received this vaccine. Advised may also receive vaccine at local pharmacy or Health Dept. Verbalized acceptance and understanding.  Screening Tests Health Maintenance  Topic Date Due  . TETANUS/TDAP  11/16/2021 (Originally [birth year]/02/1988)  . MAMMOGRAM  12/07/2020  . COVID-19 Vaccine (2 - Pfizer 3-dose series) 12/07/2020  . OPHTHALMOLOGY EXAM  02/20/2021  . HEMOGLOBIN A1C  05/16/2021  . FOOT EXAM  11/16/2021  . PAP SMEAR-Modifier  11/13/2023  . COLONOSCOPY (Pts 45-35yr Insurance coverage will need to be confirmed)  12/01/2028  . INFLUENZA VACCINE  Completed  . PNEUMOCOCCAL POLYSACCHARIDE VACCINE AGE 23-64 HIGH RISK  Completed  . Hepatitis C Screening  Completed  . HIV Screening  Completed    Health Maintenance  There are no preventive care reminders to display for this patient.  Colorectal cancer screening: Type of screening: Colonoscopy. Completed 12/01/18. Repeat every 10 years  Mammogram status: Ordered 11/16/20. Pt provided with contact info and advised to call to schedule appt.   Lung Cancer Screening: (Low Dose CT Chest recommended if Age 52-80years, 30 pack-year currently smoking OR have quit w/in 15years.) does not qualify.   Additional Screening:  Hepatitis C Screening: Up to date  Vision  Screening: Recommended annual ophthalmology exams for early detection of glaucoma and other disorders of the eye. Is the patient up to date with their annual eye exam?  Yes  Who is the provider or what is the name of the office in which the patient attends annual eye exams? [facility] in [place] If pt is not established with a provider, would they like to be referred to a provider to establish care? No .   Dental Screening: Recommended annual dental exams for proper oral hygiene  Community Resource Referral / Chronic Care Management: CRR required this visit? Yes, for transportation assistance.  CCM required this visit?  No      Plan:     I have personally reviewed and noted the following in the patient's chart:   . Medical and social history . Use of alcohol, tobacco or illicit drugs  . Current medications and supplements . Functional ability and status . Nutritional status . Physical activity . Advanced directives . List of other physicians . Hospitalizations, surgeries, and ER visits in previous 12 months . Vitals . Screenings to include cognitive, depression, and falls . Referrals and appointments  In addition, I have reviewed and discussed with patient certain preventive protocols, quality metrics, and best practice recommendations. A written personalized care plan for preventive services as well as general preventive health recommendations were provided to patient.      [place]ild Rose [place]  12/97/9892  Nurse Notes: Pt plans to call and schedule mammogram today.

## 2020-11-28 ENCOUNTER — Ambulatory Visit (INDEPENDENT_AMBULATORY_CARE_PROVIDER_SITE_OTHER): Payer: Medicare PPO

## 2020-11-28 ENCOUNTER — Telehealth: Payer: Self-pay | Admitting: Physician Assistant

## 2020-11-28 ENCOUNTER — Other Ambulatory Visit: Payer: Self-pay

## 2020-11-28 DIAGNOSIS — Z599 Problem related to housing and economic circumstances, unspecified: Secondary | ICD-10-CM

## 2020-11-28 DIAGNOSIS — Z0000 Encounter for general adult medical examination without abnormal findings: Secondary | ICD-10-CM

## 2020-11-28 DIAGNOSIS — Z748 Other problems related to care provider dependency: Secondary | ICD-10-CM | POA: Diagnosis not present

## 2020-11-28 NOTE — Patient Instructions (Signed)
Melinda [name] , Thank you for taking time to come for your Medicare Wellness Visit. I appreciate your ongoing commitment to your health goals. Please review the following plan we discussed and let me know if I can assist you in the future.   Screening recommendations/referrals: Colonoscopy: Up to date, due 11/2028 Mammogram: Currently due, order on file. Please call [phone] to schedule your mammogram.  Recommended yearly ophthalmology/optometry visit for glaucoma screening and checkup Recommended yearly dental visit for hygiene and checkup  Vaccinations: Influenza vaccine: Done 11/16/20 Tdap vaccine: Currently due, declined receiving. Shingles vaccine: Shingrix discussed. Please contact your pharmacy for coverage information.     Advanced directives: Advance directive discussed with you today. Even though you declined this today please call our office should you change your mind and we can give you the proper paperwork for you to fill out.  Conditions/risks identified: Fall risk prevention discussed today.   Next appointment: 12/07/20 for a second Covid vaccine   Preventive Care 40-64 Years, Female Preventive care refers to lifestyle choices and visits with your health care provider that can promote health and wellness. What does preventive care include?  A yearly physical exam. This is also called an annual well check.  Dental exams once or twice a year.  Routine eye exams. Ask your health care provider how often you should have your eyes checked.  Personal lifestyle choices, including:  Daily care of your teeth and gums.  Regular physical activity.  Eating a healthy diet.  Avoiding tobacco and drug use.  Limiting alcohol use.  Practicing safe sex.  Taking low-dose aspirin daily starting at age 34.  Taking vitamin and mineral supplements as recommended by your health care provider. What happens during an annual well check? The services and screenings done by your health  care provider during your annual well check will depend on your age, overall health, lifestyle risk factors, and family history of disease. Counseling  Your health care provider may ask you questions about your:  Alcohol use.  Tobacco use.  Drug use.  Emotional well-being.  Home and relationship well-being.  Sexual activity.  Eating habits.  Work and work Statistician.  Method of birth control.  Menstrual cycle.  Pregnancy history. Screening  You may have the following tests or measurements:  Height, weight, and BMI.  Blood pressure.  Lipid and cholesterol levels. These may be checked every 5 years, or more frequently if you are over 57 years old.  Skin check.  Lung cancer screening. You may have this screening every year starting at age 71 if you have a 30-pack-year history of smoking and currently smoke or have quit within the past 15 years.  Fecal occult blood test (FOBT) of the stool. You may have this test every year starting at age 89.  Flexible sigmoidoscopy or colonoscopy. You may have a sigmoidoscopy every 5 years or a colonoscopy every 10 years starting at age 2.  Hepatitis C blood test.  Hepatitis B blood test.  Sexually transmitted disease (STD) testing.  Diabetes screening. This is done by checking your blood sugar (glucose) after you have not eaten for a while (fasting). You may have this done every 1-3 years.  Mammogram. This may be done every 1-2 years. Talk to your health care provider about when you should start having regular mammograms. This may depend on whether you have a family history of breast cancer.  BRCA-related cancer screening. This may be done if you have a family history of breast, ovarian, tubal,  or peritoneal cancers.  Pelvic exam and Pap test. This may be done every 3 years starting at age 56. Starting at age 33, this may be done every 5 years if you have a Pap test in combination with an HPV test.  Bone density scan. This is  done to screen for osteoporosis. You may have this scan if you are at high risk for osteoporosis. Discuss your test results, treatment options, and if necessary, the need for more tests with your health care provider. Vaccines  Your health care provider may recommend certain vaccines, such as:  Influenza vaccine. This is recommended every year.  Tetanus, diphtheria, and acellular pertussis (Tdap, Td) vaccine. You may need a Td booster every 10 years.  Zoster vaccine. You may need this after age 58.  Pneumococcal 13-valent conjugate (PCV13) vaccine. You may need this if you have certain conditions and were not previously vaccinated.  Pneumococcal polysaccharide (PPSV23) vaccine. You may need one or two doses if you smoke cigarettes or if you have certain conditions. Talk to your health care provider about which screenings and vaccines you need and how often you need them. This information is not intended to replace advice given to you by your health care provider. Make sure you discuss any questions you have with your health care provider. Document Released: 11/24/2015 Document Revised: 07/17/2016 Document Reviewed: 08/29/2015 Elsevier Interactive Patient Education  2017 [employer] Prevention in the Home Falls can cause injuries. They can happen to people of all ages. There are many things you can do to make your home safe and to help prevent falls. What can I do on the outside of my home?  Regularly fix the edges of walkways and driveways and fix any cracks.  Remove anything that might make you trip as you walk through a door, such as a raised step or threshold.  Trim any bushes or trees on the path to your home.  Use bright outdoor lighting.  Clear any walking paths of anything that might make someone trip, such as rocks or tools.  Regularly check to see if handrails are loose or broken. Make sure that both sides of any steps have handrails.  Any raised decks and  porches should have guardrails on the edges.  Have any leaves, snow, or ice cleared regularly.  Use sand or salt on walking paths during winter.  Clean up any spills in your garage right away. This includes oil or grease spills. What can I do in the bathroom?  Use night lights.  Install grab bars by the toilet and in the tub and shower. Do not use towel bars as grab bars.  Use non-skid mats or decals in the tub or shower.  If you need to sit down in the shower, use a plastic, non-slip stool.  Keep the floor dry. Clean up any water that spills on the floor as soon as it happens.  Remove soap buildup in the tub or shower regularly.  Attach bath mats securely with double-sided non-slip rug tape.  Do not have throw rugs and other things on the floor that can make you trip. What can I do in the bedroom?  Use night lights.  Make sure that you have a light by your bed that is easy to reach.  Do not use any sheets or blankets that are too big for your bed. They should not hang down onto the floor.  Have a firm chair that has side arms. You  can use this for support while you get dressed.  Do not have throw rugs and other things on the floor that can make you trip. What can I do in the kitchen?  Clean up any spills right away.  Avoid walking on wet floors.  Keep items that you use a lot in easy-to-reach places.  If you need to reach something above you, use a strong step stool that has a grab bar.  Keep electrical cords out of the way.  Do not use floor polish or wax that makes floors slippery. If you must use wax, use non-skid floor wax.  Do not have throw rugs and other things on the floor that can make you trip. What can I do with my stairs?  Do not leave any items on the stairs.  Make sure that there are handrails on both sides of the stairs and use them. Fix handrails that are broken or loose. Make sure that handrails are as long as the stairways.  Check any  carpeting to make sure that it is firmly attached to the stairs. Fix any carpet that is loose or worn.  Avoid having throw rugs at the top or bottom of the stairs. If you do have throw rugs, attach them to the floor with carpet tape.  Make sure that you have a light switch at the top of the stairs and the bottom of the stairs. If you do not have them, ask someone to add them for you. What else can I do to help prevent falls?  Wear shoes that:  Do not have high heels.  Have rubber bottoms.  Are comfortable and fit you well.  Are closed at the toe. Do not wear sandals.  If you use a stepladder:  Make sure that it is fully opened. Do not climb a closed stepladder.  Make sure that both sides of the stepladder are locked into place.  Ask someone to hold it for you, if possible.  Clearly mark and make sure that you can see:  Any grab bars or handrails.  First and last steps.  Where the edge of each step is.  Use tools that help you move around (mobility aids) if they are needed. These include:  Canes.  Walkers.  Scooters.  Crutches.  Turn on the lights when you go into a dark area. Replace any light bulbs as soon as they burn out.  Set up your furniture so you have a clear path. Avoid moving your furniture around.  If any of your floors are uneven, fix them.  If there are any pets around you, be aware of where they are.  Review your medicines with your doctor. Some medicines can make you feel dizzy. This can increase your chance of falling. Ask your doctor what other things that you can do to help prevent falls. This information is not intended to replace advice given to you by your health care provider. Make sure you discuss any questions you have with your health care provider. Document Released: 08/24/2009 Document Revised: 04/04/2016 Document Reviewed: 12/02/2014 Elsevier Interactive Patient Education  2017 [employer].

## 2020-11-28 NOTE — Telephone Encounter (Signed)
   Telephone encounter was:  Unsuccessful.  11/28/2020 Name: SHAIRA SOVA MRN: [id number] DOB: 12/13/1970  Unsuccessful outbound call made today to assist with:  Transportation Needs   Outreach Attempt:  1st Attempt  A HIPAA compliant voice message was left requesting a return call.  Instructed patient to call back at [phone].  [place], Care Management Phone: [phone] Email: [email or web address]

## 2020-11-29 ENCOUNTER — Other Ambulatory Visit: Payer: Self-pay | Admitting: Psychiatry

## 2020-11-29 MED ORDER — ZIPRASIDONE HCL 80 MG PO CAPS: ORAL_CAPSULE | ORAL | 1 refills | Status: DC

## 2020-11-30 ENCOUNTER — Telehealth: Payer: Self-pay | Admitting: Physician Assistant

## 2020-11-30 NOTE — Telephone Encounter (Signed)
   Telephone encounter was:  Unsuccessful.  11/30/2020 Name: Melinda [name] MRN: [id number] DOB: [birth year]  Unsuccessful outbound call made today to assist with:  Transportation Needs   Outreach Attempt:  2nd Attempt  A HIPAA compliant voice message was left requesting a return call.  Instructed patient to call back at [phone].  [place], Care Management Phone: [phone] Email: [email or web address]

## 2020-12-04 ENCOUNTER — Telehealth: Payer: Self-pay | Admitting: Physician Assistant

## 2020-12-04 NOTE — Telephone Encounter (Signed)
   Telephone encounter was:  Successful.  12/04/2020 Name: Melinda [name] MRN: [id number] DOB: 06-02-1971  Melinda [name] is a 50 y.o. year old female who is a primary care patient of Trinna Post, [place] . The community resource team was consulted for assistance with Transportation Needs   Care guide performed the following interventions: Patient provided with information about care guide support team and interviewed to confirm resource needs Investigation of community resources performed Discussed resources to assist with transportation options for her. Also discussed sending her other internet/phone providers in the area, since she is paying $200 a month for home phone/internet/cable. She also doesn't have a cell phone, which I explained to her that she might want to get a prepaid one so that she can receive calls when she is out of the house and using transportation resources. Will look up affordable options for her as well. .  Follow Up Plan:  Care guide will follow up with patient by phone over the next week with an email with transportation needs and will fill out the TAMS form with her over the phone since she doesn't have a way of getting the form signed back to me.   [place], Care Management Phone: [phone] Email: [email or web address]

## 2020-12-05 ENCOUNTER — Telehealth: Payer: Self-pay | Admitting: Physician Assistant

## 2020-12-05 NOTE — Telephone Encounter (Signed)
   Telephone encounter was:  Unsuccessful.  12/05/2020 Name: Melinda [name] MRN: [id number] DOB: 01-Jun-1971  Unsuccessful outbound call made today to assist with:  Transportation Needs   Outreach Attempt:  1st Attempt  A HIPAA compliant voice message was left requesting a return call.  Instructed patient to call back at [phone]; no voicemail was left because the phone kept hanging up after a few rings. Need to fill out TAMS form with pt over the phone.  [place], Care Management Phone: [phone] Email: [email or web address]

## 2020-12-06 ENCOUNTER — Other Ambulatory Visit: Payer: Self-pay

## 2020-12-06 ENCOUNTER — Other Ambulatory Visit
Admission: RE | Admit: 2020-12-06 | Discharge: 2020-12-06 | Disposition: A | Discharge status: Home / Self Care | Payer: Medicare PPO | Source: Ambulatory Visit | Attending: Psychiatry | Admitting: Psychiatry

## 2020-12-06 ENCOUNTER — Other Ambulatory Visit: Payer: Self-pay | Admitting: Physician Assistant

## 2020-12-06 DIAGNOSIS — Z1231 Encounter for screening mammogram for malignant neoplasm of breast: Secondary | ICD-10-CM

## 2020-12-06 DIAGNOSIS — Z01812 Encounter for preprocedural laboratory examination: Secondary | ICD-10-CM | POA: Insufficient documentation

## 2020-12-06 DIAGNOSIS — Z20822 Contact with and (suspected) exposure to covid-19: Secondary | ICD-10-CM | POA: Insufficient documentation

## 2020-12-06 LAB — SARS CORONAVIRUS 2 (TAT 6-24 HRS): SARS Coronavirus 2: NEGATIVE

## 2020-12-07 ENCOUNTER — Ambulatory Visit (INDEPENDENT_AMBULATORY_CARE_PROVIDER_SITE_OTHER): Payer: Medicare PPO | Admitting: Physician Assistant

## 2020-12-07 DIAGNOSIS — Z23 Encounter for immunization: Secondary | ICD-10-CM | POA: Diagnosis not present

## 2020-12-08 ENCOUNTER — Ambulatory Visit: Payer: Self-pay | Admitting: Anesthesiology

## 2020-12-08 ENCOUNTER — Other Ambulatory Visit: Payer: Self-pay

## 2020-12-08 ENCOUNTER — Encounter
Admission: RE | Admit: 2020-12-08 | Discharge: 2020-12-08 | Disposition: A | Discharge status: Home / Self Care | Payer: Medicare PPO | Source: Ambulatory Visit | Attending: Psychiatry | Admitting: Psychiatry

## 2020-12-08 ENCOUNTER — Telehealth: Payer: Self-pay | Admitting: Physician Assistant

## 2020-12-08 ENCOUNTER — Other Ambulatory Visit: Payer: Self-pay | Admitting: Physician Assistant

## 2020-12-08 ENCOUNTER — Encounter: Payer: Self-pay | Admitting: Anesthesiology

## 2020-12-08 ENCOUNTER — Other Ambulatory Visit: Payer: Self-pay | Admitting: Psychiatry

## 2020-12-08 DIAGNOSIS — F339 Major depressive disorder, recurrent, unspecified: Secondary | ICD-10-CM | POA: Insufficient documentation

## 2020-12-08 DIAGNOSIS — F32A Depression, unspecified: Secondary | ICD-10-CM | POA: Diagnosis not present

## 2020-12-08 DIAGNOSIS — Z888 Allergy status to other drugs, medicaments and biological substances status: Secondary | ICD-10-CM | POA: Diagnosis not present

## 2020-12-08 DIAGNOSIS — Z8249 Family history of ischemic heart disease and other diseases of the circulatory system: Secondary | ICD-10-CM | POA: Insufficient documentation

## 2020-12-08 DIAGNOSIS — E119 Type 2 diabetes mellitus without complications: Secondary | ICD-10-CM | POA: Diagnosis not present

## 2020-12-08 DIAGNOSIS — E7800 Pure hypercholesterolemia, unspecified: Secondary | ICD-10-CM | POA: Diagnosis not present

## 2020-12-08 DIAGNOSIS — F332 Major depressive disorder, recurrent severe without psychotic features: Secondary | ICD-10-CM | POA: Diagnosis not present

## 2020-12-08 DIAGNOSIS — Z833 Family history of diabetes mellitus: Secondary | ICD-10-CM | POA: Insufficient documentation

## 2020-12-08 DIAGNOSIS — K219 Gastro-esophageal reflux disease without esophagitis: Secondary | ICD-10-CM | POA: Diagnosis not present

## 2020-12-08 DIAGNOSIS — M25512 Pain in left shoulder: Secondary | ICD-10-CM

## 2020-12-08 DIAGNOSIS — Z79899 Other long term (current) drug therapy: Secondary | ICD-10-CM | POA: Insufficient documentation

## 2020-12-08 DIAGNOSIS — Z791 Long term (current) use of non-steroidal anti-inflammatories (NSAID): Secondary | ICD-10-CM | POA: Insufficient documentation

## 2020-12-08 LAB — GLUCOSE, CAPILLARY: Glucose-Capillary: 135 mg/dL — ABNORMAL HIGH (ref 70–99)

## 2020-12-08 MED ORDER — SUCCINYLCHOLINE CHLORIDE 20 MG/ML IJ SOLN
INTRAMUSCULAR | Status: DC | PRN
  Administered 2020-12-08: 100 mg via INTRAVENOUS

## 2020-12-08 MED ORDER — ESMOLOL HCL 100 MG/10ML IV SOLN
INTRAVENOUS | Status: AC
  Filled 2020-12-08: qty 10

## 2020-12-08 MED ORDER — ESMOLOL HCL 100 MG/10ML IV SOLN
INTRAVENOUS | Status: DC | PRN
  Administered 2020-12-08: 20 mg via INTRAVENOUS

## 2020-12-08 MED ORDER — SODIUM CHLORIDE 0.9 % IV SOLN: INTRAVENOUS | Status: DC | PRN

## 2020-12-08 MED ORDER — KETOROLAC TROMETHAMINE 30 MG/ML IJ SOLN
INTRAMUSCULAR | Status: AC
  Filled 2020-12-08: qty 1

## 2020-12-08 MED ORDER — GLYCOPYRROLATE 0.2 MG/ML IJ SOLN
INTRAMUSCULAR | Status: AC
  Filled 2020-12-08: qty 2

## 2020-12-08 MED ORDER — SUCCINYLCHOLINE CHLORIDE 200 MG/10ML IV SOSY
PREFILLED_SYRINGE | INTRAVENOUS | Status: AC
  Filled 2020-12-08: qty 10

## 2020-12-08 MED ORDER — LABETALOL HCL 5 MG/ML IV SOLN
INTRAVENOUS | Status: AC
  Filled 2020-12-08: qty 8

## 2020-12-08 MED ORDER — GLYCOPYRROLATE 0.2 MG/ML IJ SOLN
0.4000 mg | Freq: Once | INTRAMUSCULAR | Status: AC
  Administered 2020-12-08: 0.4 mg via INTRAVENOUS

## 2020-12-08 MED ORDER — METHOHEXITAL SODIUM 100 MG/10ML IV SOSY
PREFILLED_SYRINGE | INTRAVENOUS | Status: DC | PRN
  Administered 2020-12-08: 70 mg via INTRAVENOUS

## 2020-12-08 MED ORDER — SODIUM CHLORIDE 0.9 % IV SOLN
500.0000 mL | Freq: Once | INTRAVENOUS | Status: AC
  Administered 2020-12-08: 500 mL via INTRAVENOUS

## 2020-12-08 MED ORDER — KETOROLAC TROMETHAMINE 30 MG/ML IJ SOLN
30.0000 mg | Freq: Once | INTRAMUSCULAR | Status: AC
  Administered 2020-12-08: 30 mg via INTRAVENOUS

## 2020-12-08 NOTE — Anesthesia Preprocedure Evaluation (Signed)
Anesthesia Evaluation  Patient identified by MRN, date of birth, ID band Patient awake    Reviewed: Allergy & Precautions, H&P , NPO status , Patient's Chart, lab work & pertinent test results  History of Anesthesia Complications Negative for: history of anesthetic complications  Airway Mallampati: II  TM Distance: >3 FB Neck ROM: full    Dental  (+) Poor Dentition, Chipped   Pulmonary sleep apnea , neg COPD,    Pulmonary exam normal breath sounds clear to auscultation       Cardiovascular hypertension, Pt. on medications (-) CAD and (-) Past MI negative cardio ROS Normal cardiovascular exam Rhythm:regular Rate:Normal     Neuro/Psych PSYCHIATRIC DISORDERS Depression Bipolar Disorder  Neuromuscular disease negative neurological ROS     GI/Hepatic negative GI ROS, Neg liver ROS, GERD  Medicated,  Endo/Other  diabetes, Type 2, Oral Hypoglycemic Agents  Renal/GU negative Renal ROS  negative genitourinary   Musculoskeletal   Abdominal (+) + obese,   Peds  Hematology negative hematology ROS (+)   Anesthesia Other Findings Past Medical History: No date: Depression No date: Diabetes mellitus without complication (HCC) 2/56/38: Diabetic peripheral neuropathy ([place]) 03/07/15: Diabetic peripheral neuropathy ([place]) 03/07/15: Diabetic peripheral neuropathy (HCC) No date: GERD (gastroesophageal reflux disease) 03/07/15: Hypercholesterolemia No date: Hypertension 03/07/15: Obesity 03/07/15: Personality disorder 03/07/15: Sinus tachycardia ([place])     Comment: history of 03/07/15: Suicidal thoughts Past Surgical History: 03/07/15: electroconvulsion therapy BMI    Body Mass Index:  36.44 kg/m     Reproductive/Obstetrics                             Anesthesia Physical  Anesthesia Plan  ASA: III  Anesthesia Plan: General   Post-op Pain Management:    Induction: Intravenous  PONV Risk Score and  Plan: 2 and Ondansetron  Airway Management Planned: Mask  Additional Equipment:   Intra-op Plan:   Post-operative Plan:   Informed Consent: I have reviewed the patients History and Physical, chart, labs and discussed the procedure including the risks, benefits and alternatives for the proposed anesthesia with the patient or authorized representative who has indicated his/her understanding and acceptance.     Dental Advisory Given  Plan Discussed with: CRNA and Anesthesiologist  Anesthesia Plan Comments:         Anesthesia Quick Evaluation

## 2020-12-08 NOTE — Anesthesia Postprocedure Evaluation (Signed)
Anesthesia Post Note  Patient: Melinda [name]  Procedure(s) Performed: ECT TX  Patient location during evaluation: PACU Anesthesia Type: General Level of consciousness: awake and alert Pain management: pain level controlled Vital Signs Assessment: post-procedure vital signs reviewed and stable Respiratory status: spontaneous breathing, nonlabored ventilation, respiratory function stable and patient connected to nasal cannula oxygen Cardiovascular status: blood pressure returned to baseline and stable Postop Assessment: no apparent nausea or vomiting Anesthetic complications: no   No complications documented.   Last Vitals:  Vitals:   12/08/20 1241 12/08/20 1250  BP: 109/86   Pulse: (!) 110   Resp: (!) 22   Temp: 37.8 C 36.9 C  SpO2: 96%     Last Pain:  Vitals:   12/08/20 1250  TempSrc:   PainSc: 0-No pain                 Precious Haws Teran Knittle

## 2020-12-08 NOTE — Telephone Encounter (Signed)
   Telephone encounter was:  Unsuccessful.  12/08/2020 Name: Melinda [name] MRN: [id number] DOB: November 27, 1970  Unsuccessful outbound call made today to assist with:  Transportation Needs   Outreach Attempt:  2nd Attempt  A HIPAA compliant voice message was left requesting a return call.  Instructed patient to call back at [phone].  [place], Care Management Phone: [phone] Email: [email or web address]

## 2020-12-08 NOTE — Transfer of Care (Signed)
Immediate Anesthesia Transfer of Care Note  Patient: Melinda [name]  Procedure(s) Performed: ECT TX  Patient Location: PACU  Anesthesia Type:General  Level of Consciousness: awake  Airway & Oxygen Therapy: Patient connected to face mask oxygen  Post-op Assessment: Post -op Vital signs reviewed and stable  Post vital signs: stable  Last Vitals:  Vitals Value Taken Time  BP    Temp    Pulse    Resp    SpO2      Last Pain:  Vitals:   12/08/20 1154  TempSrc: Oral  PainSc: 0-No pain         Complications: No complications documented.

## 2020-12-08 NOTE — H&P (Signed)
Melinda [name] is an 49 y.o. female.   Chief Complaint: No new complaint.  Mood doing reasonably well HPI: Health good no new complaint.  History of depression good response to ECT  Past Medical History:  Diagnosis Date  . Depression   . Diabetes mellitus without complication ([place])   . Diabetic peripheral neuropathy ([place]) 03/07/15  . Diabetic peripheral neuropathy ([place]) 03/07/15  . Diabetic peripheral neuropathy ([place]) 03/07/15  . GERD (gastroesophageal reflux disease)   . Hypercholesterolemia 03/07/15  . Hypertension   . Obesity 03/07/15  . Personality disorder ([place]) 03/07/15  . Sinus tachycardia 03/07/15   history of  . Suicidal thoughts 03/07/15    Past Surgical History:  Procedure Laterality Date  . COLONOSCOPY WITH PROPOFOL N/A 12/01/2018   Procedure: COLONOSCOPY WITH PROPOFOL;  Surgeon: Jonathon Bellows, MD;  Location: [facility] ENDOSCOPY;  Service: Gastroenterology;  Laterality: N/A;  . electroconvulsion therapy  03/07/15    Family History  Problem Relation Age of Onset  . Hypertension Father   . Diabetes Mother    Social History:  reports that she has never smoked. She has never used smokeless tobacco. She reports that she does not drink alcohol and does not use drugs.  Allergies:  Allergies  Allergen Reactions  . Prednisone     Increases blood sugar    (Not in a hospital admission)   Results for orders placed or performed during the hospital encounter of 12/08/20 (from the past 48 hour(s))  Glucose, capillary     Status: Abnormal   Collection Time: 12/08/20 12:03 PM  Result Value Ref Range   Glucose-Capillary 135 (H) 70 - 99 mg/dL    Comment: Glucose reference range applies only to samples taken after fasting for at least 8 hours.   No results found.  Review of Systems  Constitutional: Negative.   HENT: Negative.   Eyes: Negative.   Respiratory: Negative.   Cardiovascular: Negative.   Gastrointestinal: Negative.   Musculoskeletal: Negative.   Skin: Negative.    Neurological: Negative.   Psychiatric/Behavioral: Negative.     Blood pressure 108/84, pulse (!) 109, temperature 98 F (36.7 C), temperature source Oral, resp. rate (!) 24, height 5\' 4"  (1.626 m), weight 104.1 kg, SpO2 96 %. Physical Exam Constitutional:      Appearance: She is well-developed and well-nourished.  HENT:     Head: Normocephalic and atraumatic.  Eyes:     Conjunctiva/sclera: Conjunctivae normal.     Pupils: Pupils are equal, round, and reactive to light.  Cardiovascular:     Heart sounds: Normal heart sounds.  Pulmonary:     Effort: Pulmonary effort is normal.  Abdominal:     Palpations: Abdomen is soft.  Musculoskeletal:        General: Normal range of motion.     Cervical back: Normal range of motion.  Skin:    General: Skin is warm and dry.  Neurological:     Mental Status: She is alert.  Psychiatric:        Mood and Affect: Mood normal.      Assessment/Plan Continue with 3-week schedule  [name], MD 12/08/2020, 4:48 PM

## 2020-12-08 NOTE — Procedures (Signed)
ECT SERVICES Physician's Interval Evaluation & Treatment Note  Patient Identification: Melinda [name] MRN:  [id number] Date of Evaluation:  12/08/2020 TX #: [id number]  MADRS:   MMSE:   P.E. Findings:  No change physical exam  Psychiatric Interval Note:  Mood stable  Subjective:  Patient is a 50 y.o. female seen for evaluation for Electroconvulsive Therapy. No new complaint  Treatment Summary:   []   Right Unilateral             [x]  Bilateral   % Energy : 1.0 ms 35%   Impedance: 840 ohms  Seizure Energy Index: 3753 V squared  Postictal Suppression Index: 25%  Seizure Concordance Index: 92%  Medications  Pre Shock: Robinul 0.4 mg Toradol 30 mg Brevital 70 mg succinylcholine 100 mg labetalol 20 mg esmolol 20 mg  Post Shock:    Seizure Duration: 28 seconds EMG 67 seconds EEG   Comments: Follow-up 3 weeks  Lungs:  [x]   Clear to auscultation               []  Other:   Heart:    [x]   Regular rhythm             []  irregular rhythm    [x]   Previous H&P reviewed, patient examined and there are NO CHANGES                 []   Previous H&P reviewed, patient examined and there are changes noted.   [name], MD 1/28/20224:50 PM

## 2020-12-09 NOTE — Telephone Encounter (Signed)
Requested Prescriptions  Pending Prescriptions Disp Refills  . meloxicam (MOBIC) 7.5 MG tablet [Pharmacy Med Name: MELOXICAM 7.5 MG Tablet] 30 tablet 0    Sig: TAKE 1 TABLET EVERY DAY     Analgesics:  COX2 Inhibitors Failed - 12/08/2020  7:25 PM      Failed - HGB in normal range and within 360 days    Hemoglobin  Date Value Ref Range Status  10/06/2018 12.4 11.1 - 15.9 g/dL Final         Passed - Cr in normal range and within 360 days    Creatinine  Date Value Ref Range Status  05/08/2013 0.77 0.60 - 1.30 mg/dL Final   Creatinine, Ser  Date Value Ref Range Status  05/16/2020 0.86 0.57 - 1.00 mg/dL Final         Passed - Patient is not pregnant      Passed - Valid encounter within last 12 months    Recent Outpatient Visits          3 weeks ago Annual physical exam   [employer], Adriana M, PA-C   6 months ago Controlled type 2 diabetes mellitus without complication, without long-term current use of insulin [facility])   [place], [place], PA-C   11 months ago Controlled type 2 diabetes mellitus without complication, without long-term current use of insulin [facility])   [facility] [place], [place], PA-C   1 year ago Type 2 diabetes mellitus with diabetic polyneuropathy, without long-term current use of insulin [facility])   [facility] [place], [place], [place]   1 year ago Type 2 diabetes mellitus with diabetic polyneuropathy, without long-term current use of insulin [facility])   [facility] [place], Wendee Beavers, [place]      Future Appointments            In 3 months [name], Wendee Beavers, PA-C [employer], [facility]

## 2020-12-11 ENCOUNTER — Telehealth: Payer: Self-pay | Admitting: Physician Assistant

## 2020-12-11 NOTE — Telephone Encounter (Signed)
° °  Telephone encounter was:  Successful.  12/11/2020 Name: Melinda [name] MRN: [id number] DOB: 1971/05/23  Melinda [name] is a 50 y.o. year old female who is a primary care patient of Trinna Post, [place] . The community resource team was consulted for assistance with Transportation Needs , Food Insecurity and Financial Difficulties related to utility bills for cable, internet and phone.   Care guide performed the following interventions: Investigation of community resources performed Sent patient an email with trasnportation, food banks and cable companies in her area that she can call to see if she can bundle the services and pay less each month. Pt confirmed that she received the email. .  Follow Up Plan:  No further follow up planned at this time. The patient has been provided with needed resources.  [employer], Care Management Phone: [phone] Email: [email or web address]

## 2020-12-14 NOTE — Progress Notes (Signed)
Patient here for COVID vaccination only.  I did not examine the patient.  I did review his medical history, medications, and allergies and vaccine consent form.  CMA gave vaccination. Patient tolerated well.  Paulene Floor [place] 12/14/2020 9:51 AM

## 2020-12-27 ENCOUNTER — Other Ambulatory Visit: Payer: Self-pay

## 2020-12-27 ENCOUNTER — Other Ambulatory Visit
Admission: RE | Admit: 2020-12-27 | Discharge: 2020-12-27 | Disposition: A | Discharge status: Home / Self Care | Payer: Medicare PPO | Source: Ambulatory Visit | Attending: Psychiatry | Admitting: Psychiatry

## 2020-12-27 DIAGNOSIS — Z20822 Contact with and (suspected) exposure to covid-19: Secondary | ICD-10-CM | POA: Insufficient documentation

## 2020-12-27 DIAGNOSIS — Z01812 Encounter for preprocedural laboratory examination: Secondary | ICD-10-CM | POA: Insufficient documentation

## 2020-12-28 LAB — SARS CORONAVIRUS 2 (TAT 6-24 HRS): SARS Coronavirus 2: NEGATIVE

## 2020-12-29 ENCOUNTER — Other Ambulatory Visit: Payer: Self-pay

## 2020-12-29 ENCOUNTER — Encounter: Payer: Self-pay | Admitting: Anesthesiology

## 2020-12-29 ENCOUNTER — Encounter
Admission: RE | Admit: 2020-12-29 | Discharge: 2020-12-29 | Disposition: A | Discharge status: Home / Self Care | Payer: Medicare PPO | Source: Ambulatory Visit | Attending: Psychiatry | Admitting: Psychiatry

## 2020-12-29 ENCOUNTER — Other Ambulatory Visit: Payer: Self-pay | Admitting: Psychiatry

## 2020-12-29 DIAGNOSIS — K219 Gastro-esophageal reflux disease without esophagitis: Secondary | ICD-10-CM | POA: Diagnosis not present

## 2020-12-29 DIAGNOSIS — F332 Major depressive disorder, recurrent severe without psychotic features: Secondary | ICD-10-CM | POA: Diagnosis not present

## 2020-12-29 DIAGNOSIS — F339 Major depressive disorder, recurrent, unspecified: Secondary | ICD-10-CM | POA: Insufficient documentation

## 2020-12-29 DIAGNOSIS — I10 Essential (primary) hypertension: Secondary | ICD-10-CM | POA: Diagnosis not present

## 2020-12-29 DIAGNOSIS — E1140 Type 2 diabetes mellitus with diabetic neuropathy, unspecified: Secondary | ICD-10-CM | POA: Diagnosis not present

## 2020-12-29 DIAGNOSIS — F329 Major depressive disorder, single episode, unspecified: Secondary | ICD-10-CM | POA: Insufficient documentation

## 2020-12-29 DIAGNOSIS — E7800 Pure hypercholesterolemia, unspecified: Secondary | ICD-10-CM | POA: Diagnosis not present

## 2020-12-29 LAB — GLUCOSE, CAPILLARY: Glucose-Capillary: 131 mg/dL — ABNORMAL HIGH (ref 70–99)

## 2020-12-29 MED ORDER — METHOHEXITAL SODIUM 100 MG/10ML IV SOSY
PREFILLED_SYRINGE | INTRAVENOUS | Status: DC | PRN
  Administered 2020-12-29: 70 mg via INTRAVENOUS

## 2020-12-29 MED ORDER — SODIUM CHLORIDE 0.9 % IV SOLN: INTRAVENOUS | Status: DC | PRN

## 2020-12-29 MED ORDER — GLYCOPYRROLATE 0.2 MG/ML IJ SOLN
INTRAMUSCULAR | Status: AC
  Administered 2020-12-29: 0.4 mg via INTRAVENOUS
  Filled 2020-12-29: qty 2

## 2020-12-29 MED ORDER — KETOROLAC TROMETHAMINE 30 MG/ML IJ SOLN
INTRAMUSCULAR | Status: AC
  Administered 2020-12-29: 30 mg via INTRAVENOUS
  Filled 2020-12-29: qty 1

## 2020-12-29 MED ORDER — ONDANSETRON HCL 4 MG/2ML IJ SOLN: 4.0000 mg | Freq: Once | INTRAMUSCULAR | Status: DC | PRN

## 2020-12-29 MED ORDER — ESMOLOL HCL 100 MG/10ML IV SOLN
INTRAVENOUS | Status: DC | PRN
  Administered 2020-12-29: 20 mg via INTRAVENOUS

## 2020-12-29 MED ORDER — KETOROLAC TROMETHAMINE 30 MG/ML IJ SOLN: 30.0000 mg | Freq: Once | INTRAMUSCULAR | Status: AC

## 2020-12-29 MED ORDER — SUCCINYLCHOLINE CHLORIDE 20 MG/ML IJ SOLN
INTRAMUSCULAR | Status: DC | PRN
  Administered 2020-12-29: 100 mg via INTRAVENOUS

## 2020-12-29 MED ORDER — GLYCOPYRROLATE 0.2 MG/ML IJ SOLN: 0.4000 mg | Freq: Once | INTRAMUSCULAR | Status: AC

## 2020-12-29 MED ORDER — LABETALOL HCL 5 MG/ML IV SOLN
INTRAVENOUS | Status: DC | PRN
  Administered 2020-12-29: 30 mg via INTRAVENOUS

## 2020-12-29 MED ORDER — FENTANYL CITRATE (PF) 100 MCG/2ML IJ SOLN: 25.0000 ug | INTRAMUSCULAR | Status: DC | PRN

## 2020-12-29 MED ORDER — SODIUM CHLORIDE 0.9 % IV SOLN
500.0000 mL | Freq: Once | INTRAVENOUS | Status: AC
  Administered 2020-12-29: 500 mL via INTRAVENOUS

## 2020-12-29 NOTE — Anesthesia Procedure Notes (Signed)
Performed by: Jonna Clark, CRNA Pre-anesthesia Checklist: Patient identified, Emergency Drugs available, Suction available and Patient being monitored Patient Re-evaluated:Patient Re-evaluated prior to induction Oxygen Delivery Method: Circle system utilized Preoxygenation: Pre-oxygenation with 100% oxygen Induction Type: IV induction Ventilation: Mask ventilation without difficulty and Mask ventilation throughout procedure Airway Equipment and Method: Bite block Placement Confirmation: positive ETCO2 Dental Injury: Teeth and Oropharynx as per pre-operative assessment

## 2020-12-29 NOTE — Anesthesia Preprocedure Evaluation (Signed)
Anesthesia Evaluation  Patient identified by MRN, date of birth, ID band Patient awake    Reviewed: Allergy & Precautions, H&P , NPO status , Patient's Chart, lab work & pertinent test results, reviewed documented beta blocker date and time   Airway Mallampati: II   Neck ROM: full    Dental  (+) Teeth Intact   Pulmonary sleep apnea and Continuous Positive Airway Pressure Ventilation ,    Pulmonary exam normal        Cardiovascular Exercise Tolerance: Poor hypertension, On Medications negative cardio ROS Normal cardiovascular exam Rhythm:regular Rate:Normal     Neuro/Psych PSYCHIATRIC DISORDERS Depression Bipolar Disorder  Neuromuscular disease    GI/Hepatic Neg liver ROS, GERD  Medicated,  Endo/Other  negative endocrine ROSdiabetes  Renal/GU negative Renal ROS  negative genitourinary   Musculoskeletal   Abdominal   Peds  Hematology negative hematology ROS (+)   Anesthesia Other Findings Past Medical History: No date: Depression No date: Diabetes mellitus without complication ([place]) 9/45/03: Diabetic peripheral neuropathy ([place]) 03/07/15: Diabetic peripheral neuropathy ([place]) 03/07/15: Diabetic peripheral neuropathy ([facility]) No date: GERD (gastroesophageal reflux disease) 03/07/15: Hypercholesterolemia No date: Hypertension 03/07/15: Obesity 03/07/15: Personality disorder ([place]) 03/07/15: Sinus tachycardia     Comment:  history of 03/07/15: Suicidal thoughts Past Surgical History: 12/01/2018: COLONOSCOPY WITH PROPOFOL; N/A     Comment:  Procedure: COLONOSCOPY WITH PROPOFOL;  Surgeon: Jonathon Bellows, MD;  [facility] ENDOSCOPY;  Service:               Gastroenterology;  Laterality: N/A; 03/07/15: electroconvulsion therapy   Reproductive/Obstetrics negative OB ROS                             Anesthesia Physical Anesthesia Plan  ASA: III  Anesthesia Plan: General   Post-op Pain  Management:    Induction:   PONV Risk Score and Plan:   Airway Management Planned:   Additional Equipment:   Intra-op Plan:   Post-operative Plan:   Informed Consent: I have reviewed the patients History and Physical, chart, labs and discussed the procedure including the risks, benefits and alternatives for the proposed anesthesia with the patient or authorized representative who has indicated his/her understanding and acceptance.     Dental Advisory Given  Plan Discussed with: CRNA  Anesthesia Plan Comments:         Anesthesia Quick Evaluation

## 2020-12-29 NOTE — H&P (Signed)
Melinda [name] is an 50 y.o. female.   Chief Complaint: no complaint HPI: recurrent depression  Past Medical History:  Diagnosis Date  . Depression   . Diabetes mellitus without complication ([place])   . Diabetic peripheral neuropathy ([place]) 03/07/15  . Diabetic peripheral neuropathy ([place]) 03/07/15  . Diabetic peripheral neuropathy ([place]) 03/07/15  . GERD (gastroesophageal reflux disease)   . Hypercholesterolemia 03/07/15  . Hypertension   . Obesity 03/07/15  . Personality disorder ([place]) 03/07/15  . Sinus tachycardia 03/07/15   history of  . Suicidal thoughts 03/07/15    Past Surgical History:  Procedure Laterality Date  . COLONOSCOPY WITH PROPOFOL N/A 12/01/2018   Procedure: COLONOSCOPY WITH PROPOFOL;  Surgeon: Jonathon Bellows, MD;  Location: [facility] ENDOSCOPY;  Service: Gastroenterology;  Laterality: N/A;  . electroconvulsion therapy  03/07/15    Family History  Problem Relation Age of Onset  . Hypertension Father   . Diabetes Mother    Social History:  reports that she has never smoked. She has never used smokeless tobacco. She reports that she does not drink alcohol and does not use drugs.  Allergies:  Allergies  Allergen Reactions  . Prednisone     Increases blood sugar    (Not in a hospital admission)   Results for orders placed or performed during the hospital encounter of 12/29/20 (from the past 48 hour(s))  Glucose, capillary     Status: Abnormal   Collection Time: 12/29/20 12:05 PM  Result Value Ref Range   Glucose-Capillary 131 (H) 70 - 99 mg/dL    Comment: Glucose reference range applies only to samples taken after fasting for at least 8 hours.   No results found.  Review of Systems  Constitutional: Negative.   HENT: Negative.   Eyes: Negative.   Respiratory: Negative.   Cardiovascular: Negative.   Gastrointestinal: Negative.   Musculoskeletal: Negative.   Skin: Negative.   Neurological: Negative.   Psychiatric/Behavioral: Negative.     Blood pressure  130/80, pulse 97, temperature 98.7 F (37.1 C), temperature source Oral, resp. rate 18, height 5\' 4"  (1.626 m), weight 104.1 kg. Physical Exam Vitals and nursing note reviewed.  Constitutional:      Appearance: She is well-developed and well-nourished.  HENT:     Head: Normocephalic and atraumatic.  Eyes:     Conjunctiva/sclera: Conjunctivae normal.     Pupils: Pupils are equal, round, and reactive to light.  Cardiovascular:     Heart sounds: Normal heart sounds.  Pulmonary:     Effort: Pulmonary effort is normal.  Abdominal:     Palpations: Abdomen is soft.  Musculoskeletal:        General: Normal range of motion.     Cervical back: Normal range of motion.  Skin:    General: Skin is warm and dry.  Neurological:     General: No focal deficit present.     Mental Status: She is alert.  Psychiatric:        Mood and Affect: Mood normal.      Assessment/Plan  maintenance q 3 week  [name], MD 12/29/2020, 12:09 PM

## 2020-12-29 NOTE — Transfer of Care (Signed)
Immediate Anesthesia Transfer of Care Note  Patient: Melinda [name]  Procedure(s) Performed: ECT TX  Patient Location: PACU  Anesthesia Type:General  Level of Consciousness: drowsy and patient cooperative  Airway & Oxygen Therapy: Patient Spontanous Breathing  Post-op Assessment: Report given to RN and Post -op Vital signs reviewed and stable  Post vital signs: Reviewed and stable  Last Vitals:  Vitals Value Taken Time  BP 165/98 12/29/20 1359  Temp    Pulse 108 12/29/20 1401  Resp 24 12/29/20 1401  SpO2 95 % 12/29/20 1401  Vitals shown include unvalidated device data.  Last Pain:  Vitals:   12/29/20 1207  TempSrc:   PainSc: 0-No pain         Complications: No complications documented.

## 2020-12-31 NOTE — Anesthesia Postprocedure Evaluation (Signed)
Anesthesia Post Note  Patient: Melinda [name]  Procedure(s) Performed: ECT TX  Patient location during evaluation: PACU Anesthesia Type: General Level of consciousness: awake and alert Pain management: pain level controlled Vital Signs Assessment: post-procedure vital signs reviewed and stable Respiratory status: spontaneous breathing, nonlabored ventilation, respiratory function stable and patient connected to nasal cannula oxygen Cardiovascular status: blood pressure returned to baseline and stable Postop Assessment: no apparent nausea or vomiting Anesthetic complications: no   No complications documented.   Last Vitals:  Vitals:   12/29/20 1420 12/29/20 1424  BP: (!) 130/93 (!) 130/93  Pulse: 94 95  Resp: 20 (!) 21  Temp:  36.5 C  SpO2: 96% 97%    Last Pain:  Vitals:   12/29/20 1424  TempSrc:   PainSc: 0-No pain                 Molli Barrows

## 2021-01-02 ENCOUNTER — Other Ambulatory Visit: Payer: Self-pay | Admitting: Physician Assistant

## 2021-01-02 DIAGNOSIS — M25512 Pain in left shoulder: Secondary | ICD-10-CM

## 2021-01-03 ENCOUNTER — Telehealth: Payer: Self-pay | Admitting: Physician Assistant

## 2021-01-03 NOTE — Telephone Encounter (Signed)
   Telephone encounter was:  Successful.  01/03/2021 Name: Melinda [name] MRN: [id number] DOB: [birth year]  Melinda [name] is a 50 y.o. year old female who is a primary care patient of Trinna Post, [place] . The community resource team was consulted for assistance with Transportation Needs   Care guide performed the following interventions: Placed referral to [facility] [employer] [facility] via email to [employer]. Also sent in the filled out portions of the [place] partA and partB forms to [facility] for the doctors office to fill out and send back to [employer]. Or they can have the patient pick up the filled out forms and return them for herself. I also emailed the patient and let he know that I did get the forms and that I was sending them to the proper places. Waiting to hear back whether the doctors office will fill out the forms and if pt is on the list for TAMS. .  Follow Up Plan:  Care guide will outreach resources to assist patient with transportation.   [place], Care Management Phone: [phone] Email: [email or web address]

## 2021-01-03 NOTE — Telephone Encounter (Signed)
Requested Prescriptions  Pending Prescriptions Disp Refills  . meloxicam (MOBIC) 7.5 MG tablet [Pharmacy Med Name: MELOXICAM 7.5 MG Tablet] 30 tablet 0    Sig: TAKE 1 TABLET EVERY DAY     Analgesics:  COX2 Inhibitors Failed - 01/02/2021  7:41 PM      Failed - HGB in normal range and within 360 days    Hemoglobin  Date Value Ref Range Status  10/06/2018 12.4 11.1 - 15.9 g/dL Final         Passed - Cr in normal range and within 360 days    Creatinine  Date Value Ref Range Status  05/08/2013 0.77 0.60 - 1.30 mg/dL Final   Creatinine, Ser  Date Value Ref Range Status  05/16/2020 0.86 0.57 - 1.00 mg/dL Final         Passed - Patient is not pregnant      Passed - Valid encounter within last 12 months    Recent Outpatient Visits          1 month ago Annual physical exam   [employer], Adriana M, PA-C   7 months ago Controlled type 2 diabetes mellitus without complication, without long-term current use of insulin [facility])   [facility] [place], Adriana M, PA-C   11 months ago Controlled type 2 diabetes mellitus without complication, without long-term current use of insulin [facility])   [facility] [place], [place], PA-C   1 year ago Type 2 diabetes mellitus with diabetic polyneuropathy, without long-term current use of insulin [facility])   [facility] [place], [place], PA-C   1 year ago Type 2 diabetes mellitus with diabetic polyneuropathy, without long-term current use of insulin [facility])   [facility] [place], Wendee Beavers, [place]      Future Appointments            In 2 months Trinna Post, PA-C [employer], [facility]

## 2021-01-04 ENCOUNTER — Telehealth: Payer: Self-pay | Admitting: Physician Assistant

## 2021-01-04 NOTE — Telephone Encounter (Signed)
° °  Telephone encounter was:  Unsuccessful.  01/04/2021 Name: Melinda [name] MRN: [id number] DOB: 05/20/1971  Unsuccessful outbound call made today to assist with:  Transportation Needs   Outreach Attempt:  3rd Attempt.  Referral closed unable to contact patient.  A HIPAA compliant voice message was left requesting a return call.  Instructed patient to call back at [phone]. Left a message regarding her acceptance to the TAMS program and that [facility] in the doctors office will work on getting the doctor to fill out the forms for [place] application. I did send the forms to be filled out by the doctor's office and got the patient onto the [place]. There is nothing further that we as community guides can do at this point. Closing the lead again.   [place], Care Management Phone: [phone] Email: [email or web address]

## 2021-01-08 ENCOUNTER — Other Ambulatory Visit: Payer: Self-pay | Admitting: Physician Assistant

## 2021-01-08 ENCOUNTER — Telehealth: Payer: Self-pay

## 2021-01-08 DIAGNOSIS — E119 Type 2 diabetes mellitus without complications: Secondary | ICD-10-CM

## 2021-01-08 NOTE — Telephone Encounter (Signed)
Please see previous encounter. Received [employer] [place] application for transportation assistance needs from careguide. Forms placed in PCPs box to complete and fax back to [phone]. Thank you!

## 2021-01-12 NOTE — Telephone Encounter (Signed)
I filled out the form. However, the patient listed another provider as the contact for the form. If she is okay with me placing my name on it, it just needs my license # and it can be faxed.

## 2021-01-12 NOTE — Telephone Encounter (Signed)
Pt called back and stated it is okay to list Carles Collet as her provider on the form.

## 2021-01-17 ENCOUNTER — Other Ambulatory Visit
Admission: RE | Admit: 2021-01-17 | Discharge: 2021-01-17 | Disposition: A | Discharge status: Home / Self Care | Payer: Medicare PPO | Source: Ambulatory Visit | Attending: Psychiatry | Admitting: Psychiatry

## 2021-01-17 ENCOUNTER — Other Ambulatory Visit: Payer: Self-pay

## 2021-01-17 DIAGNOSIS — Z20822 Contact with and (suspected) exposure to covid-19: Secondary | ICD-10-CM | POA: Diagnosis not present

## 2021-01-17 DIAGNOSIS — Z01812 Encounter for preprocedural laboratory examination: Secondary | ICD-10-CM | POA: Diagnosis not present

## 2021-01-17 LAB — SARS CORONAVIRUS 2 (TAT 6-24 HRS): SARS Coronavirus 2: NEGATIVE

## 2021-01-18 ENCOUNTER — Other Ambulatory Visit: Payer: Self-pay | Admitting: Psychiatry

## 2021-01-19 ENCOUNTER — Other Ambulatory Visit: Payer: Self-pay

## 2021-01-19 ENCOUNTER — Ambulatory Visit
Admission: RE | Admit: 2021-01-19 | Discharge: 2021-01-19 | Disposition: A | Discharge status: Home / Self Care | Payer: Medicare PPO | Source: Ambulatory Visit | Attending: Psychiatry | Admitting: Psychiatry

## 2021-01-19 ENCOUNTER — Encounter: Payer: Self-pay | Admitting: Anesthesiology

## 2021-01-19 DIAGNOSIS — F332 Major depressive disorder, recurrent severe without psychotic features: Secondary | ICD-10-CM | POA: Diagnosis not present

## 2021-01-19 DIAGNOSIS — K219 Gastro-esophageal reflux disease without esophagitis: Secondary | ICD-10-CM | POA: Diagnosis not present

## 2021-01-19 DIAGNOSIS — Z6839 Body mass index (BMI) 39.0-39.9, adult: Secondary | ICD-10-CM | POA: Diagnosis not present

## 2021-01-19 DIAGNOSIS — Z79899 Other long term (current) drug therapy: Secondary | ICD-10-CM | POA: Insufficient documentation

## 2021-01-19 DIAGNOSIS — F339 Major depressive disorder, recurrent, unspecified: Secondary | ICD-10-CM | POA: Insufficient documentation

## 2021-01-19 DIAGNOSIS — E119 Type 2 diabetes mellitus without complications: Secondary | ICD-10-CM | POA: Diagnosis not present

## 2021-01-19 DIAGNOSIS — E7800 Pure hypercholesterolemia, unspecified: Secondary | ICD-10-CM | POA: Diagnosis not present

## 2021-01-19 LAB — GLUCOSE, CAPILLARY: Glucose-Capillary: 141 mg/dL — ABNORMAL HIGH (ref 70–99)

## 2021-01-19 MED ORDER — KETOROLAC TROMETHAMINE 30 MG/ML IJ SOLN
INTRAMUSCULAR | Status: AC
  Administered 2021-01-19: 30 mg via INTRAVENOUS
  Filled 2021-01-19: qty 1

## 2021-01-19 MED ORDER — SODIUM CHLORIDE 0.9 % IV SOLN
500.0000 mL | Freq: Once | INTRAVENOUS | Status: AC
  Administered 2021-01-19: 500 mL via INTRAVENOUS

## 2021-01-19 MED ORDER — METHOHEXITAL SODIUM 100 MG/10ML IV SOSY
PREFILLED_SYRINGE | INTRAVENOUS | Status: DC | PRN
  Administered 2021-01-19: 70 mg via INTRAVENOUS

## 2021-01-19 MED ORDER — ESMOLOL HCL 100 MG/10ML IV SOLN
INTRAVENOUS | Status: DC | PRN
  Administered 2021-01-19: 20 mg via INTRAVENOUS

## 2021-01-19 MED ORDER — GLYCOPYRROLATE 0.2 MG/ML IJ SOLN
INTRAMUSCULAR | Status: AC
  Administered 2021-01-19: 0.4 mg via INTRAVENOUS
  Filled 2021-01-19: qty 2

## 2021-01-19 MED ORDER — SUCCINYLCHOLINE CHLORIDE 20 MG/ML IJ SOLN
INTRAMUSCULAR | Status: DC | PRN
  Administered 2021-01-19: 100 mg via INTRAVENOUS

## 2021-01-19 MED ORDER — SODIUM CHLORIDE 0.9 % IV SOLN: INTRAVENOUS | Status: DC | PRN

## 2021-01-19 MED ORDER — LABETALOL HCL 5 MG/ML IV SOLN
INTRAVENOUS | Status: DC | PRN
  Administered 2021-01-19: 30 mg via INTRAVENOUS

## 2021-01-19 MED ORDER — GLYCOPYRROLATE 0.2 MG/ML IJ SOLN: 0.4000 mg | Freq: Once | INTRAMUSCULAR | Status: AC

## 2021-01-19 MED ORDER — KETOROLAC TROMETHAMINE 30 MG/ML IJ SOLN: 30.0000 mg | Freq: Once | INTRAMUSCULAR | Status: AC

## 2021-01-19 NOTE — Anesthesia Procedure Notes (Signed)
Date/Time: 01/19/2021 12:42 PM Performed by: Hedda Slade, CRNA Pre-anesthesia Checklist: Patient identified, Emergency Drugs available, Suction available and Patient being monitored Patient Re-evaluated:Patient Re-evaluated prior to induction Oxygen Delivery Method: Circle system utilized Preoxygenation: Pre-oxygenation with 100% oxygen Induction Type: IV induction Ventilation: Mask ventilation without difficulty and Mask ventilation throughout procedure Airway Equipment and Method: Bite block Placement Confirmation: positive ETCO2 Dental Injury: Teeth and Oropharynx as per pre-operative assessment

## 2021-01-19 NOTE — Transfer of Care (Signed)
Immediate Anesthesia Transfer of Care Note  Patient: Melinda [name]  Procedure(s) Performed: ECT TX  Patient Location: PACU  Anesthesia Type:General  Level of Consciousness: drowsy  Airway & Oxygen Therapy: Patient connected to face mask oxygen  Post-op Assessment: Post -op Vital signs reviewed and stable  Post vital signs: stable  Last Vitals:  Vitals Value Taken Time  BP 188/125 01/19/21 1253  Temp    Pulse 108 01/19/21 1255  Resp 31 01/19/21 1255  SpO2 100 % 01/19/21 1255  Vitals shown include unvalidated device data.  Last Pain:  Vitals:   01/19/21 1148  TempSrc:   PainSc: 0-No pain         Complications: No complications documented.

## 2021-01-19 NOTE — Procedures (Signed)
ECT SERVICES Physician's Interval Evaluation & Treatment Note  Patient Identification: Melinda [name] MRN:  [id number] Date of Evaluation:  01/19/2021 TX #: [id number]  MADRS:   MMSE:   P.E. Findings:  No change physical exam  Psychiatric Interval Note:  Mood about stable  Subjective:  Patient is a 50 y.o. female seen for evaluation for Electroconvulsive Therapy. No new complaint  Treatment Summary:   []   Right Unilateral             [x]  Bilateral   % Energy : 1.0 ms 35%   Impedance: 1620 ohms  Seizure Energy Index: 6341 V squared  Postictal Suppression Index: 79%  Seizure Concordance Index: 92%  Medications  Pre Shock: Robinul 0.4 mg Toradol 30 mg labetalol 20 mg esmolol 20 mg Brevital 70 mg succinylcholine 100 mg  Post Shock:    Seizure Duration: 32 seconds EMG 70 seconds EEG   Comments: Follow-up 3 weeks  Lungs:  [x]   Clear to auscultation               []  Other:   Heart:    [x]   Regular rhythm             []  irregular rhythm    [x]   Previous H&P reviewed, patient examined and there are NO CHANGES                 []   Previous H&P reviewed, patient examined and there are changes noted.   [name], MD 3/11/20227:35 PM

## 2021-01-19 NOTE — Anesthesia Postprocedure Evaluation (Signed)
Anesthesia Post Note  Patient: Melinda [name]  Procedure(s) Performed: ECT TX  Patient location during evaluation: PACU Anesthesia Type: General Level of consciousness: awake and alert Pain management: pain level controlled Vital Signs Assessment: post-procedure vital signs reviewed and stable Respiratory status: spontaneous breathing and respiratory function stable Cardiovascular status: stable Anesthetic complications: no   No complications documented.   Last Vitals:  Vitals:   01/19/21 1300 01/19/21 1316  BP: (!) 147/101 (!) 123/92  Pulse: (!) 104 98  Resp: (!) 25 (!) 26  Temp:    SpO2: 99% 96%    Last Pain:  Vitals:   01/19/21 1300  TempSrc:   PainSc: 0-No pain                 Damont Balles K

## 2021-01-19 NOTE — H&P (Signed)
Melinda [name] States is an 50 y.o. female.   Chief Complaint: mood stable HPI: recurrent depression  Past Medical History:  Diagnosis Date  . Depression   . Diabetes mellitus without complication ([place])   . Diabetic peripheral neuropathy ([place]) 03/07/15  . Diabetic peripheral neuropathy ([place]) 03/07/15  . Diabetic peripheral neuropathy ([place]) 03/07/15  . GERD (gastroesophageal reflux disease)   . Hypercholesterolemia 03/07/15  . Hypertension   . Obesity 03/07/15  . Personality disorder ([place]) 03/07/15  . Sinus tachycardia 03/07/15   history of  . Suicidal thoughts 03/07/15    Past Surgical History:  Procedure Laterality Date  . COLONOSCOPY WITH PROPOFOL N/A 12/01/2018   Procedure: COLONOSCOPY WITH PROPOFOL;  Surgeon: Jonathon Bellows, MD;  Location: [facility] ENDOSCOPY;  Service: Gastroenterology;  Laterality: N/A;  . electroconvulsion therapy  03/07/15    Family History  Problem Relation Age of Onset  . Hypertension Father   . Diabetes Mother    Social History:  reports that she has never smoked. She has never used smokeless tobacco. She reports that she does not drink alcohol and does not use drugs.  Allergies:  Allergies  Allergen Reactions  . Prednisone     Increases blood sugar    (Not in a hospital admission)   Results for orders placed or performed during the hospital encounter of 01/19/21 (from the past 48 hour(s))  Glucose, capillary     Status: Abnormal   Collection Time: 01/19/21 11:40 AM  Result Value Ref Range   Glucose-Capillary 141 (H) 70 - 99 mg/dL    Comment: Glucose reference range applies only to samples taken after fasting for at least 8 hours.   No results found.  Review of Systems  Constitutional: Negative.   HENT: Negative.   Eyes: Negative.   Respiratory: Negative.   Cardiovascular: Negative.   Gastrointestinal: Negative.   Musculoskeletal: Negative.   Skin: Negative.   Neurological: Negative.   Psychiatric/Behavioral: Negative.     Blood pressure (!)  152/97, pulse 86, temperature 97.8 F (36.6 C), temperature source Oral, resp. rate 18, height 5\' 4"  (1.626 m), weight 104.1 kg, SpO2 98 %. Physical Exam Vitals and nursing note reviewed.  Constitutional:      Appearance: She is well-developed.  HENT:     Head: Normocephalic and atraumatic.  Eyes:     Conjunctiva/sclera: Conjunctivae normal.     Pupils: Pupils are equal, round, and reactive to light.  Cardiovascular:     Heart sounds: Normal heart sounds.  Pulmonary:     Effort: Pulmonary effort is normal.  Abdominal:     Palpations: Abdomen is soft.  Musculoskeletal:        General: Normal range of motion.     Cervical back: Normal range of motion.  Skin:    General: Skin is warm and dry.  Neurological:     General: No focal deficit present.     Mental Status: She is alert.  Psychiatric:        Mood and Affect: Mood normal.        Thought Content: Thought content normal.        Judgment: Judgment normal.      Assessment/Plan Continue maintenance ect  Melinda Berthold, MD 01/19/2021, 12:29 PM

## 2021-01-19 NOTE — Anesthesia Preprocedure Evaluation (Addendum)
Anesthesia Evaluation  Patient identified by MRN, date of birth, ID band Patient awake    Reviewed: Allergy & Precautions, H&P , NPO status , Patient's Chart, lab work & pertinent test results, reviewed documented beta blocker date and time   Airway Mallampati: II       Dental   Pulmonary sleep apnea and Continuous Positive Airway Pressure Ventilation ,           Cardiovascular Exercise Tolerance: Poor hypertension, On Medications negative cardio ROS       Neuro/Psych PSYCHIATRIC DISORDERS Depression Bipolar Disorder  Neuromuscular disease    GI/Hepatic Neg liver ROS, GERD  Medicated,  Endo/Other  diabetes  Renal/GU negative Renal ROS  negative genitourinary   Musculoskeletal   Abdominal   Peds  Hematology negative hematology ROS (+)   Anesthesia Other Findings   Reproductive/Obstetrics negative OB ROS                             Anesthesia Physical  Anesthesia Plan  ASA: III  Anesthesia Plan: General   Post-op Pain Management:    Induction:   PONV Risk Score and Plan: 3  Airway Management Planned: Mask  Additional Equipment:   Intra-op Plan:   Post-operative Plan:   Informed Consent: I have reviewed the patients History and Physical, chart, labs and discussed the procedure including the risks, benefits and alternatives for the proposed anesthesia with the patient or authorized representative who has indicated his/her understanding and acceptance.       Plan Discussed with:   Anesthesia Plan Comments:         Anesthesia Quick Evaluation

## 2021-01-29 ENCOUNTER — Other Ambulatory Visit: Payer: Self-pay | Admitting: Physician Assistant

## 2021-01-29 DIAGNOSIS — I10 Essential (primary) hypertension: Secondary | ICD-10-CM

## 2021-01-31 ENCOUNTER — Ambulatory Visit
Admission: RE | Admit: 2021-01-31 | Discharge: 2021-01-31 | Disposition: A | Discharge status: Home / Self Care | Payer: Medicare PPO | Source: Ambulatory Visit | Attending: Physician Assistant | Admitting: Physician Assistant

## 2021-01-31 ENCOUNTER — Other Ambulatory Visit: Payer: Self-pay

## 2021-01-31 DIAGNOSIS — Z1231 Encounter for screening mammogram for malignant neoplasm of breast: Secondary | ICD-10-CM | POA: Insufficient documentation

## 2021-02-07 ENCOUNTER — Other Ambulatory Visit: Payer: Self-pay | Admitting: Physician Assistant

## 2021-02-07 ENCOUNTER — Other Ambulatory Visit: Payer: Self-pay

## 2021-02-07 ENCOUNTER — Other Ambulatory Visit
Admission: RE | Admit: 2021-02-07 | Discharge: 2021-02-07 | Disposition: A | Discharge status: Home / Self Care | Payer: Medicare PPO | Source: Ambulatory Visit | Attending: Psychiatry | Admitting: Psychiatry

## 2021-02-07 DIAGNOSIS — Z01812 Encounter for preprocedural laboratory examination: Secondary | ICD-10-CM | POA: Insufficient documentation

## 2021-02-07 DIAGNOSIS — M25512 Pain in left shoulder: Secondary | ICD-10-CM

## 2021-02-07 DIAGNOSIS — Z20822 Contact with and (suspected) exposure to covid-19: Secondary | ICD-10-CM | POA: Insufficient documentation

## 2021-02-07 LAB — SARS CORONAVIRUS 2 (TAT 6-24 HRS): SARS Coronavirus 2: NEGATIVE

## 2021-02-07 NOTE — Telephone Encounter (Signed)
Requested Prescriptions  Pending Prescriptions Disp Refills  . meloxicam (MOBIC) 7.5 MG tablet [Pharmacy Med Name: MELOXICAM 7.5 MG Tablet] 90 tablet 3    Sig: TAKE 1 TABLET EVERY DAY     Analgesics:  COX2 Inhibitors Failed - 02/07/2021  8:22 PM      Failed - HGB in normal range and within 360 days    Hemoglobin  Date Value Ref Range Status  10/06/2018 12.4 11.1 - 15.9 g/dL Final         Passed - Cr in normal range and within 360 days    Creatinine  Date Value Ref Range Status  05/08/2013 0.77 0.60 - 1.30 mg/dL Final   Creatinine, Ser  Date Value Ref Range Status  05/16/2020 0.86 0.57 - 1.00 mg/dL Final         Passed - Patient is not pregnant      Passed - Valid encounter within last 12 months    Recent Outpatient Visits          2 months ago Annual physical exam   [employer], Adriana M, PA-C   8 months ago Controlled type 2 diabetes mellitus without complication, without long-term current use of insulin [facility])   [facility] [place], [place], PA-C   1 year ago Controlled type 2 diabetes mellitus without complication, without long-term current use of insulin [facility])   [facility] [place], [place], PA-C   1 year ago Type 2 diabetes mellitus with diabetic polyneuropathy, without long-term current use of insulin [facility])   [facility] [place], [place], PA-C   1 year ago Type 2 diabetes mellitus with diabetic polyneuropathy, without long-term current use of insulin [facility])   [facility] [place], Wendee [place], [place]      Future Appointments            In 1 month Bacigalupo, Dionne Bucy, MD [facility], [facility]

## 2021-02-08 MED ORDER — MELOXICAM 7.5 MG PO TABS: 7.5000 mg | ORAL_TABLET | Freq: Every day | ORAL | 3 refills | Status: DC

## 2021-02-08 NOTE — Addendum Note (Signed)
Addended by: Dimple Nanas on: 02/08/2021 04:42 PM   Modules accepted: Orders

## 2021-02-09 ENCOUNTER — Other Ambulatory Visit: Payer: Self-pay

## 2021-02-09 ENCOUNTER — Encounter: Payer: Self-pay | Admitting: Registered Nurse

## 2021-02-09 ENCOUNTER — Other Ambulatory Visit: Payer: Self-pay | Admitting: Psychiatry

## 2021-02-09 ENCOUNTER — Encounter
Admission: RE | Admit: 2021-02-09 | Discharge: 2021-02-09 | Disposition: A | Discharge status: Home / Self Care | Payer: Medicare HMO | Source: Ambulatory Visit | Attending: Psychiatry | Admitting: Psychiatry

## 2021-02-09 DIAGNOSIS — F339 Major depressive disorder, recurrent, unspecified: Secondary | ICD-10-CM | POA: Diagnosis not present

## 2021-02-09 DIAGNOSIS — E1140 Type 2 diabetes mellitus with diabetic neuropathy, unspecified: Secondary | ICD-10-CM | POA: Diagnosis not present

## 2021-02-09 DIAGNOSIS — Z79899 Other long term (current) drug therapy: Secondary | ICD-10-CM | POA: Insufficient documentation

## 2021-02-09 DIAGNOSIS — Z791 Long term (current) use of non-steroidal anti-inflammatories (NSAID): Secondary | ICD-10-CM | POA: Insufficient documentation

## 2021-02-09 DIAGNOSIS — F332 Major depressive disorder, recurrent severe without psychotic features: Secondary | ICD-10-CM | POA: Diagnosis not present

## 2021-02-09 DIAGNOSIS — Z6839 Body mass index (BMI) 39.0-39.9, adult: Secondary | ICD-10-CM | POA: Diagnosis not present

## 2021-02-09 DIAGNOSIS — Z8249 Family history of ischemic heart disease and other diseases of the circulatory system: Secondary | ICD-10-CM | POA: Diagnosis not present

## 2021-02-09 DIAGNOSIS — Z888 Allergy status to other drugs, medicaments and biological substances status: Secondary | ICD-10-CM | POA: Insufficient documentation

## 2021-02-09 DIAGNOSIS — Z833 Family history of diabetes mellitus: Secondary | ICD-10-CM | POA: Diagnosis not present

## 2021-02-09 DIAGNOSIS — I10 Essential (primary) hypertension: Secondary | ICD-10-CM | POA: Diagnosis not present

## 2021-02-09 DIAGNOSIS — K219 Gastro-esophageal reflux disease without esophagitis: Secondary | ICD-10-CM | POA: Diagnosis not present

## 2021-02-09 LAB — GLUCOSE, CAPILLARY: Glucose-Capillary: 103 mg/dL — ABNORMAL HIGH (ref 70–99)

## 2021-02-09 MED ORDER — SUCCINYLCHOLINE CHLORIDE 20 MG/ML IJ SOLN
INTRAMUSCULAR | Status: DC | PRN
  Administered 2021-02-09: 100 mg via INTRAVENOUS

## 2021-02-09 MED ORDER — KETOROLAC TROMETHAMINE 30 MG/ML IJ SOLN: 30.0000 mg | Freq: Once | INTRAMUSCULAR | Status: AC

## 2021-02-09 MED ORDER — METHOHEXITAL SODIUM 100 MG/10ML IV SOSY
PREFILLED_SYRINGE | INTRAVENOUS | Status: DC | PRN
  Administered 2021-02-09: 70 mg via INTRAVENOUS

## 2021-02-09 MED ORDER — LABETALOL HCL 5 MG/ML IV SOLN
INTRAVENOUS | Status: DC | PRN
  Administered 2021-02-09: 30 mg via INTRAVENOUS

## 2021-02-09 MED ORDER — GLYCOPYRROLATE 0.2 MG/ML IJ SOLN: 0.4000 mg | Freq: Once | INTRAMUSCULAR | Status: AC

## 2021-02-09 MED ORDER — ESMOLOL HCL 100 MG/10ML IV SOLN
INTRAVENOUS | Status: DC | PRN
  Administered 2021-02-09: 20 mg via INTRAVENOUS

## 2021-02-09 MED ORDER — LABETALOL HCL 5 MG/ML IV SOLN
INTRAVENOUS | Status: AC
  Filled 2021-02-09: qty 8

## 2021-02-09 MED ORDER — METHOHEXITAL SODIUM 0.5 G IJ SOLR
INTRAMUSCULAR | Status: AC
  Filled 2021-02-09: qty 500

## 2021-02-09 MED ORDER — SODIUM CHLORIDE 0.9 % IV SOLN
500.0000 mL | Freq: Once | INTRAVENOUS | Status: AC
  Administered 2021-02-09: 500 mL via INTRAVENOUS

## 2021-02-09 MED ORDER — GLYCOPYRROLATE 0.2 MG/ML IJ SOLN
INTRAMUSCULAR | Status: AC
  Administered 2021-02-09: 0.4 mg via INTRAVENOUS
  Filled 2021-02-09: qty 2

## 2021-02-09 MED ORDER — ESMOLOL HCL 100 MG/10ML IV SOLN
INTRAVENOUS | Status: AC
  Filled 2021-02-09: qty 10

## 2021-02-09 MED ORDER — KETOROLAC TROMETHAMINE 30 MG/ML IJ SOLN
INTRAMUSCULAR | Status: AC
  Administered 2021-02-09: 30 mg via INTRAVENOUS
  Filled 2021-02-09: qty 1

## 2021-02-09 NOTE — H&P (Signed)
Melinda [name] is an 50 y.o. female.   Chief Complaint: stable chronic mood problems  HPI: ecurrent and chronic depression  Past Medical History:  Diagnosis Date  . Depression   . Diabetes mellitus without complication ([place])   . Diabetic peripheral neuropathy ([place]) 03/07/15  . Diabetic peripheral neuropathy ([place]) 03/07/15  . Diabetic peripheral neuropathy ([place]) 03/07/15  . GERD (gastroesophageal reflux disease)   . Hypercholesterolemia 03/07/15  . Hypertension   . Obesity 03/07/15  . Personality disorder ([place]) 03/07/15  . Sinus tachycardia 03/07/15   history of  . Suicidal thoughts 03/07/15    Past Surgical History:  Procedure Laterality Date  . COLONOSCOPY WITH PROPOFOL N/A 12/01/2018   Procedure: COLONOSCOPY WITH PROPOFOL;  Surgeon: Jonathon Bellows, MD;  Location: [facility] ENDOSCOPY;  Service: Gastroenterology;  Laterality: N/A;  . electroconvulsion therapy  03/07/15    Family History  Problem Relation Age of Onset  . Hypertension Father   . Diabetes Mother    Social History:  reports that she has never smoked. She has never used smokeless tobacco. She reports that she does not drink alcohol and does not use drugs.  Allergies:  Allergies  Allergen Reactions  . Prednisone     Increases blood sugar    (Not in a hospital admission)   Results for orders placed or performed during the hospital encounter of 02/09/21 (from the past 48 hour(s))  Glucose, capillary     Status: Abnormal   Collection Time: 02/09/21 10:37 AM  Result Value Ref Range   Glucose-Capillary 103 (H) 70 - 99 mg/dL    Comment: Glucose reference range applies only to samples taken after fasting for at least 8 hours.   No results found.  Review of Systems  Constitutional: Negative.   HENT: Negative.   Eyes: Negative.   Respiratory: Negative.   Cardiovascular: Negative.   Gastrointestinal: Negative.   Musculoskeletal: Negative.   Skin: Negative.   Neurological: Negative.   Psychiatric/Behavioral: Negative.      Blood pressure 127/70, pulse 98, temperature 98.3 F (36.8 C), temperature source Oral, resp. rate 20, height 5\' 4"  (1.626 m), weight 104.1 kg, SpO2 98 %. Physical Exam Vitals and nursing note reviewed.  Constitutional:      Appearance: She is well-developed.  HENT:     Head: Normocephalic and atraumatic.  Eyes:     Conjunctiva/sclera: Conjunctivae normal.     Pupils: Pupils are equal, round, and reactive to light.  Cardiovascular:     Heart sounds: Normal heart sounds.  Pulmonary:     Effort: Pulmonary effort is normal.  Abdominal:     Palpations: Abdomen is soft.  Musculoskeletal:        General: Normal range of motion.     Cervical back: Normal range of motion.  Skin:    General: Skin is warm and dry.  Neurological:     General: No focal deficit present.     Mental Status: She is alert.  Psychiatric:        Mood and Affect: Mood normal.        Thought Content: Thought content normal.        Judgment: Judgment normal.      Assessment/Plan Continue q3 maintenance  [name], MD 02/09/2021, 12:18 PM

## 2021-02-09 NOTE — Anesthesia Preprocedure Evaluation (Signed)
Anesthesia Evaluation  Patient identified by MRN, date of birth, ID band Patient awake    Reviewed: Allergy & Precautions, H&P , NPO status , Patient's Chart, lab work & pertinent test results  History of Anesthesia Complications Negative for: history of anesthetic complications  Airway Mallampati: II  TM Distance: >3 FB Neck ROM: full    Dental  (+) Poor Dentition, Chipped   Pulmonary sleep apnea , neg COPD,    Pulmonary exam normal breath sounds clear to auscultation       Cardiovascular hypertension, Pt. on medications (-) CAD and (-) Past MI negative cardio ROS Normal cardiovascular exam Rhythm:regular Rate:Normal     Neuro/Psych PSYCHIATRIC DISORDERS Depression Bipolar Disorder  Neuromuscular disease negative neurological ROS     GI/Hepatic negative GI ROS, Neg liver ROS, GERD  Medicated,  Endo/Other  diabetes, Type 2, Oral Hypoglycemic Agents  Renal/GU negative Renal ROS  negative genitourinary   Musculoskeletal   Abdominal (+) + obese,   Peds  Hematology negative hematology ROS (+)   Anesthesia Other Findings Past Medical History: No date: Depression No date: Diabetes mellitus without complication (HCC) 2/56/38: Diabetic peripheral neuropathy ([place]) 03/07/15: Diabetic peripheral neuropathy ([place]) 03/07/15: Diabetic peripheral neuropathy (HCC) No date: GERD (gastroesophageal reflux disease) 03/07/15: Hypercholesterolemia No date: Hypertension 03/07/15: Obesity 03/07/15: Personality disorder 03/07/15: Sinus tachycardia ([place])     Comment: history of 03/07/15: Suicidal thoughts Past Surgical History: 03/07/15: electroconvulsion therapy BMI    Body Mass Index:  36.44 kg/m     Reproductive/Obstetrics                             Anesthesia Physical  Anesthesia Plan  ASA: III  Anesthesia Plan: General   Post-op Pain Management:    Induction: Intravenous  PONV Risk Score and  Plan: 2 and Ondansetron  Airway Management Planned: Mask  Additional Equipment:   Intra-op Plan:   Post-operative Plan:   Informed Consent: I have reviewed the patients History and Physical, chart, labs and discussed the procedure including the risks, benefits and alternatives for the proposed anesthesia with the patient or authorized representative who has indicated his/her understanding and acceptance.     Dental Advisory Given  Plan Discussed with: CRNA and Anesthesiologist  Anesthesia Plan Comments:         Anesthesia Quick Evaluation

## 2021-02-09 NOTE — Transfer of Care (Signed)
Immediate Anesthesia Transfer of Care Note  Patient: Shterna M Overturf  Procedure(s) Performed: ECT TX  Patient Location: PACU  Anesthesia Type:General  Level of Consciousness: sedated  Airway & Oxygen Therapy: Patient Spontanous Breathing and Patient connected to face mask oxygen  Post-op Assessment: Report given to RN and Post -op Vital signs reviewed and stable  Post vital signs: Reviewed and stable  Last Vitals:  Vitals Value Taken Time  BP 136/98 02/09/21 1303  Temp    Pulse 98 02/09/21 1308  Resp 23 02/09/21 1308  SpO2 100 % 02/09/21 1308  Vitals shown include unvalidated device data.  Last Pain:  Vitals:   02/09/21 1040  TempSrc:   PainSc: 0-No pain         Complications: No complications documented.

## 2021-02-12 NOTE — Anesthesia Postprocedure Evaluation (Signed)
Anesthesia Post Note  Patient: Melinda [name]  Procedure(s) Performed: ECT TX  Patient location during evaluation: PACU Anesthesia Type: General Level of consciousness: awake and alert and oriented Pain management: pain level controlled Vital Signs Assessment: post-procedure vital signs reviewed and stable Respiratory status: spontaneous breathing, nonlabored ventilation and respiratory function stable Cardiovascular status: blood pressure returned to baseline and stable Postop Assessment: no signs of nausea or vomiting Anesthetic complications: no   No complications documented.   Last Vitals:  Vitals:   02/09/21 1340 02/09/21 1435  BP: 134/84 132/82  Pulse:  94  Resp:  20  Temp: (!) 36.4 C (!) 36.4 C  SpO2:      Last Pain:  Vitals:   02/09/21 1435  TempSrc: Oral  PainSc: 0-No pain                 [name]

## 2021-03-01 ENCOUNTER — Other Ambulatory Visit: Payer: Self-pay | Admitting: Psychiatry

## 2021-03-02 ENCOUNTER — Other Ambulatory Visit: Payer: Self-pay

## 2021-03-02 ENCOUNTER — Encounter (HOSPITAL_COMMUNITY)
Admission: RE | Admit: 2021-03-02 | Discharge: 2021-03-02 | Disposition: A | Discharge status: Home / Self Care | Payer: Medicare HMO | Source: Ambulatory Visit | Attending: Psychiatry | Admitting: Psychiatry

## 2021-03-02 ENCOUNTER — Encounter: Payer: Self-pay | Admitting: Anesthesiology

## 2021-03-02 DIAGNOSIS — E1169 Type 2 diabetes mellitus with other specified complication: Secondary | ICD-10-CM | POA: Diagnosis not present

## 2021-03-02 DIAGNOSIS — F332 Major depressive disorder, recurrent severe without psychotic features: Secondary | ICD-10-CM

## 2021-03-02 DIAGNOSIS — I10 Essential (primary) hypertension: Secondary | ICD-10-CM | POA: Diagnosis not present

## 2021-03-02 DIAGNOSIS — Z833 Family history of diabetes mellitus: Secondary | ICD-10-CM | POA: Diagnosis not present

## 2021-03-02 DIAGNOSIS — Z888 Allergy status to other drugs, medicaments and biological substances status: Secondary | ICD-10-CM | POA: Diagnosis not present

## 2021-03-02 DIAGNOSIS — K219 Gastro-esophageal reflux disease without esophagitis: Secondary | ICD-10-CM | POA: Diagnosis not present

## 2021-03-02 DIAGNOSIS — Z791 Long term (current) use of non-steroidal anti-inflammatories (NSAID): Secondary | ICD-10-CM | POA: Diagnosis not present

## 2021-03-02 DIAGNOSIS — Z79899 Other long term (current) drug therapy: Secondary | ICD-10-CM | POA: Diagnosis not present

## 2021-03-02 DIAGNOSIS — F339 Major depressive disorder, recurrent, unspecified: Secondary | ICD-10-CM | POA: Diagnosis not present

## 2021-03-02 DIAGNOSIS — Z8249 Family history of ischemic heart disease and other diseases of the circulatory system: Secondary | ICD-10-CM | POA: Diagnosis not present

## 2021-03-02 LAB — GLUCOSE, CAPILLARY: Glucose-Capillary: 121 mg/dL — ABNORMAL HIGH (ref 70–99)

## 2021-03-02 MED ORDER — LABETALOL HCL 5 MG/ML IV SOLN
INTRAVENOUS | Status: DC | PRN
  Administered 2021-03-02: 30 mg via INTRAVENOUS

## 2021-03-02 MED ORDER — SODIUM CHLORIDE 0.9 % IV SOLN: INTRAVENOUS | Status: DC | PRN

## 2021-03-02 MED ORDER — KETOROLAC TROMETHAMINE 30 MG/ML IJ SOLN: 30.0000 mg | Freq: Once | INTRAMUSCULAR | Status: AC

## 2021-03-02 MED ORDER — LABETALOL HCL 5 MG/ML IV SOLN
INTRAVENOUS | Status: AC
  Filled 2021-03-02: qty 4

## 2021-03-02 MED ORDER — SUCCINYLCHOLINE CHLORIDE 200 MG/10ML IV SOSY
PREFILLED_SYRINGE | INTRAVENOUS | Status: AC
  Filled 2021-03-02: qty 10

## 2021-03-02 MED ORDER — GLYCOPYRROLATE 0.2 MG/ML IJ SOLN
INTRAMUSCULAR | Status: AC
  Administered 2021-03-02: 0.4 mg via INTRAVENOUS
  Filled 2021-03-02: qty 2

## 2021-03-02 MED ORDER — ESMOLOL HCL 100 MG/10ML IV SOLN
INTRAVENOUS | Status: DC | PRN
  Administered 2021-03-02: 20 mg via INTRAVENOUS

## 2021-03-02 MED ORDER — METHOHEXITAL SODIUM 100 MG/10ML IV SOSY
PREFILLED_SYRINGE | INTRAVENOUS | Status: DC | PRN
  Administered 2021-03-02: 70 mg via INTRAVENOUS

## 2021-03-02 MED ORDER — SODIUM CHLORIDE 0.9 % IV SOLN
500.0000 mL | Freq: Once | INTRAVENOUS | Status: AC
  Administered 2021-03-02: 500 mL via INTRAVENOUS

## 2021-03-02 MED ORDER — KETOROLAC TROMETHAMINE 30 MG/ML IJ SOLN
INTRAMUSCULAR | Status: AC
  Administered 2021-03-02: 30 mg via INTRAVENOUS
  Filled 2021-03-02: qty 1

## 2021-03-02 MED ORDER — SUCCINYLCHOLINE CHLORIDE 200 MG/10ML IV SOSY
PREFILLED_SYRINGE | INTRAVENOUS | Status: DC | PRN
  Administered 2021-03-02: 100 mg via INTRAVENOUS

## 2021-03-02 MED ORDER — GLYCOPYRROLATE 0.2 MG/ML IJ SOLN: 0.4000 mg | Freq: Once | INTRAMUSCULAR | Status: AC

## 2021-03-02 MED ORDER — ESMOLOL HCL 100 MG/10ML IV SOLN
INTRAVENOUS | Status: AC
  Filled 2021-03-02: qty 10

## 2021-03-02 NOTE — Transfer of Care (Signed)
Immediate Anesthesia Transfer of Care Note  Patient: Melinda [name]  Procedure(s) Performed: ECT TX  Patient Location: PACU  Anesthesia Type:General  Level of Consciousness: unresponsive and drowsy  Airway & Oxygen Therapy: Patient Spontanous Breathing and Patient connected to face mask oxygen  Post-op Assessment: Report given to RN and Post -op Vital signs reviewed and stable  Post vital signs: Reviewed and stable  Last Vitals:  Vitals Value Taken Time  BP 144/71 03/02/21 1328  Temp 36.8 C 03/02/21 1328  Pulse 101 03/02/21 1330  Resp 21 03/02/21 1330  SpO2 100 % 03/02/21 1330  Vitals shown include unvalidated device data.  Last Pain:  Vitals:   03/02/21 1328  TempSrc:   PainSc: Asleep         Complications: No complications documented.

## 2021-03-02 NOTE — Anesthesia Preprocedure Evaluation (Signed)
Anesthesia Evaluation  Patient identified by MRN, date of birth, ID band Patient awake    Reviewed: Allergy & Precautions, H&P , NPO status , Patient's Chart, lab work & pertinent test results  History of Anesthesia Complications Negative for: history of anesthetic complications  Airway Mallampati: II  TM Distance: >3 FB Neck ROM: full    Dental  (+) Poor Dentition, Chipped   Pulmonary sleep apnea , neg COPD,    Pulmonary exam normal breath sounds clear to auscultation       Cardiovascular hypertension, Pt. on medications (-) CAD and (-) Past MI negative cardio ROS Normal cardiovascular exam Rhythm:regular Rate:Normal     Neuro/Psych PSYCHIATRIC DISORDERS Depression Bipolar Disorder  Neuromuscular disease negative neurological ROS     GI/Hepatic negative GI ROS, Neg liver ROS, GERD  Medicated,  Endo/Other  diabetes, Type 2, Oral Hypoglycemic Agents  Renal/GU negative Renal ROS  negative genitourinary   Musculoskeletal   Abdominal (+) + obese,   Peds  Hematology negative hematology ROS (+)   Anesthesia Other Findings Past Medical History: No date: Depression No date: Diabetes mellitus without complication (HCC) 2/56/38: Diabetic peripheral neuropathy ([place]) 03/07/15: Diabetic peripheral neuropathy ([place]) 03/07/15: Diabetic peripheral neuropathy (HCC) No date: GERD (gastroesophageal reflux disease) 03/07/15: Hypercholesterolemia No date: Hypertension 03/07/15: Obesity 03/07/15: Personality disorder 03/07/15: Sinus tachycardia ([place])     Comment: history of 03/07/15: Suicidal thoughts Past Surgical History: 03/07/15: electroconvulsion therapy BMI    Body Mass Index:  36.44 kg/m     Reproductive/Obstetrics                             Anesthesia Physical  Anesthesia Plan  ASA: III  Anesthesia Plan: General   Post-op Pain Management:    Induction: Intravenous  PONV Risk Score and  Plan: 2 and Ondansetron  Airway Management Planned: Mask  Additional Equipment:   Intra-op Plan:   Post-operative Plan:   Informed Consent: I have reviewed the patients History and Physical, chart, labs and discussed the procedure including the risks, benefits and alternatives for the proposed anesthesia with the patient or authorized representative who has indicated his/her understanding and acceptance.     Dental Advisory Given  Plan Discussed with: CRNA and Anesthesiologist  Anesthesia Plan Comments:         Anesthesia Quick Evaluation

## 2021-03-02 NOTE — Anesthesia Procedure Notes (Signed)
Procedure Name: General with mask airway Date/Time: 03/02/2021 1:21 PM Performed by: Allean Found, CRNA Pre-anesthesia Checklist: Patient identified, Emergency Drugs available, Suction available, Patient being monitored and Timeout performed Patient Re-evaluated:Patient Re-evaluated prior to induction Oxygen Delivery Method: Circle system utilized Preoxygenation: Pre-oxygenation with 100% oxygen Induction Type: IV induction Ventilation: Mask ventilation throughout procedure and Mask ventilation without difficulty

## 2021-03-02 NOTE — H&P (Signed)
Melinda [name] is an 50 y.o. female.   Chief Complaint: Chronic depression HPI: Recurrent chronic depression with good response to maintenance ECT  Past Medical History:  Diagnosis Date  . Depression   . Diabetes mellitus without complication ([place])   . Diabetic peripheral neuropathy ([place]) 03/07/15  . Diabetic peripheral neuropathy ([place]) 03/07/15  . Diabetic peripheral neuropathy ([place]) 03/07/15  . GERD (gastroesophageal reflux disease)   . Hypercholesterolemia 03/07/15  . Hypertension   . Obesity 03/07/15  . Personality disorder ([place]) 03/07/15  . Sinus tachycardia 03/07/15   history of  . Suicidal thoughts 03/07/15    Past Surgical History:  Procedure Laterality Date  . COLONOSCOPY WITH PROPOFOL N/A 12/01/2018   Procedure: COLONOSCOPY WITH PROPOFOL;  Surgeon: Jonathon Bellows, MD;  Location: [facility] ENDOSCOPY;  Service: Gastroenterology;  Laterality: N/A;  . electroconvulsion therapy  03/07/15    Family History  Problem Relation Age of Onset  . Hypertension Father   . Diabetes Mother    Social History:  reports that she has never smoked. She has never used smokeless tobacco. She reports that she does not drink alcohol and does not use drugs.  Allergies:  Allergies  Allergen Reactions  . Prednisone     Increases blood sugar    (Not in a hospital admission)   Results for orders placed or performed during the hospital encounter of 03/02/21 (from the past 48 hour(s))  Glucose, capillary     Status: Abnormal   Collection Time: 03/02/21 12:00 PM  Result Value Ref Range   Glucose-Capillary 121 (H) 70 - 99 mg/dL    Comment: Glucose reference range applies only to samples taken after fasting for at least 8 hours.   No results found.  Review of Systems  Constitutional: Negative.   HENT: Negative.   Eyes: Negative.   Respiratory: Negative.   Cardiovascular: Negative.   Gastrointestinal: Negative.   Musculoskeletal: Negative.   Skin: Negative.   Neurological: Negative.    Psychiatric/Behavioral: Negative.     Blood pressure (!) 155/90, pulse 87, temperature (!) 97.5 F (36.4 C), temperature source Oral, resp. rate 20, height 5\' 4"  (1.626 m), weight 104.1 kg, SpO2 98 %. Physical Exam Vitals and nursing note reviewed.  Constitutional:      Appearance: She is well-developed.  HENT:     Head: Normocephalic and atraumatic.  Eyes:     Conjunctiva/sclera: Conjunctivae normal.     Pupils: Pupils are equal, round, and reactive to light.  Cardiovascular:     Heart sounds: Normal heart sounds.  Pulmonary:     Effort: Pulmonary effort is normal.  Abdominal:     Palpations: Abdomen is soft.  Musculoskeletal:        General: Normal range of motion.     Cervical back: Normal range of motion.  Skin:    General: Skin is warm and dry.  Neurological:     General: No focal deficit present.     Mental Status: She is alert.  Psychiatric:        Mood and Affect: Mood normal.      Assessment/Plan No change to overall situation continue maintenance plan 3 weeks  [name], MD 03/02/2021, 12:05 PM

## 2021-03-02 NOTE — Anesthesia Postprocedure Evaluation (Signed)
Anesthesia Post Note  Patient: Melinda [name]  Procedure(s) Performed: ECT TX  Patient location during evaluation: PACU Anesthesia Type: General Level of consciousness: awake and alert Pain management: pain level controlled Vital Signs Assessment: post-procedure vital signs reviewed and stable Respiratory status: spontaneous breathing, nonlabored ventilation, respiratory function stable and patient connected to nasal cannula oxygen Cardiovascular status: blood pressure returned to baseline and stable Postop Assessment: no apparent nausea or vomiting Anesthetic complications: no   No complications documented.   Last Vitals:  Vitals:   03/02/21 1410 03/02/21 1420  BP: (!) 107/52 102/64  Pulse: 81 78  Resp: 17 20  Temp: 36.7 C   SpO2: 96% 96%    Last Pain:  Vitals:   03/02/21 1410  TempSrc:   PainSc: 0-No pain                 Martha Clan

## 2021-03-04 NOTE — Procedures (Signed)
ECT SERVICES Physician's Interval Evaluation & Treatment Note  Patient Identification: Melinda [name] MRN:  [id number] Date of Evaluation:  03/04/2021 TX #: [id number]   MADRS:   MMSE:   P.E. Findings:  unremarkable  Psychiatric Interval Note:  stable  Subjective:  Patient is a 50 y.o. female seen for evaluation for Electroconvulsive Therapy. No change  Treatment Summary:   []   Right Unilateral             [x]  Bilateral   % Energy : 1.30ms 35%   Impedance: 1350 ohms  Seizure Energy Index: 4815 mv 2   Postictal Suppression Index: 79%   Seizure Concordance Index: 96%  Medications  Pre Shock: robinol 0.4mg , torodol 30 mg, labetalol 20mg , esmolol 20mg , brevital 70mg , succinylcholine 100mg   Post Shock:    Seizure Duration: 42s emg, 93 s eeg   Comments: Follow up 3 weeks   Lungs:  [x]   Clear to auscultation               []  Other:   Heart:    [x]   Regular rhythm             []  irregular rhythm    [x]   Previous H&P reviewed, patient examined and there are NO CHANGES                 []   Previous H&P reviewed, patient examined and there are changes noted.   [name], MD 4/22/20229:16 PM

## 2021-03-08 ENCOUNTER — Other Ambulatory Visit: Payer: Self-pay | Admitting: Physician Assistant

## 2021-03-08 DIAGNOSIS — E119 Type 2 diabetes mellitus without complications: Secondary | ICD-10-CM

## 2021-03-08 MED ORDER — METFORMIN HCL ER 500 MG PO TB24: 500.0000 mg | ORAL_TABLET | Freq: Two times a day (BID) | ORAL | 0 refills | Status: DC

## 2021-03-08 NOTE — Telephone Encounter (Signed)
Future visit in 1 week  

## 2021-03-08 NOTE — Telephone Encounter (Signed)
Contacted pt to clarify her pharmacy of choice; she would like her refill sent to [facility]; will proceed with refill attempt.

## 2021-03-13 NOTE — Patient Instructions (Signed)
Diabetes Mellitus and Nutrition, Adult When you have diabetes, or diabetes mellitus, it is very important to have healthy eating habits because your blood sugar (glucose) levels are greatly affected by what you eat and drink. Eating healthy foods in the right amounts, at about the same times every day, can help you:  Control your blood glucose.  Lower your risk of heart disease.  Improve your blood pressure.  Reach or maintain a healthy weight. What can affect my meal plan? Every person with diabetes is different, and each person has different needs for a meal plan. Your health care provider may recommend that you work with a dietitian to make a meal plan that is best for you. Your meal plan may vary depending on factors such as:  The calories you need.  The medicines you take.  Your weight.  Your blood glucose, blood pressure, and cholesterol levels.  Your activity level.  Other health conditions you have, such as heart or kidney disease. How do carbohydrates affect me? Carbohydrates, also called carbs, affect your blood glucose level more than any other type of food. Eating carbs naturally raises the amount of glucose in your blood. Carb counting is a method for keeping track of how many carbs you eat. Counting carbs is important to keep your blood glucose at a healthy level, especially if you use insulin or take certain oral diabetes medicines. It is important to know how many carbs you can safely have in each meal. This is different for every person. Your dietitian can help you calculate how many carbs you should have at each meal and for each snack. How does alcohol affect me? Alcohol can cause a sudden decrease in blood glucose (hypoglycemia), especially if you use insulin or take certain oral diabetes medicines. Hypoglycemia can be a life-threatening condition. Symptoms of hypoglycemia, such as sleepiness, dizziness, and confusion, are similar to symptoms of having too much  alcohol.  Do not drink alcohol if: ? Your health care provider tells you not to drink. ? You are pregnant, may be pregnant, or are planning to become pregnant.  If you drink alcohol: ? Do not drink on an empty stomach. ? Limit how much you use to:  0-1 drink a day for women.  0-2 drinks a day for men. ? Be aware of how much alcohol is in your drink. In the [place] one drink equals one 12 oz bottle of beer (355 mL), one 5 oz glass of wine (148 mL), or one 1 oz glass of hard liquor (44 mL). ? Keep yourself hydrated with water, diet soda, or unsweetened iced tea.  Keep in mind that regular soda, juice, and other mixers may contain a lot of sugar and must be counted as carbs. What are tips for following this plan? Reading food labels  Start by checking the serving size on the "Nutrition Facts" label of packaged foods and drinks. The amount of calories, carbs, fats, and other nutrients listed on the label is based on one serving of the item. Many items contain more than one serving per package.  Check the total grams (g) of carbs in one serving. You can calculate the number of servings of carbs in one serving by dividing the total carbs by 15. For example, if a food has 30 g of total carbs per serving, it would be equal to 2 servings of carbs.  Check the number of grams (g) of saturated fats and trans fats in one serving. Choose foods that have   a low amount or none of these fats.  Check the number of milligrams (mg) of salt (sodium) in one serving. Most people should limit total sodium intake to less than 2,300 mg per day.  Always check the nutrition information of foods labeled as "low-fat" or "nonfat." These foods may be higher in added sugar or refined carbs and should be avoided.  Talk to your dietitian to identify your daily goals for nutrients listed on the label. Shopping  Avoid buying canned, pre-made, or processed foods. These foods tend to be high in fat, sodium, and added  sugar.  Shop around the outside edge of the grocery store. This is where you will most often find fresh fruits and vegetables, bulk grains, fresh meats, and fresh dairy. Cooking  Use low-heat cooking methods, such as baking, instead of high-heat cooking methods like deep frying.  Cook using healthy oils, such as olive, canola, or sunflower oil.  Avoid cooking with butter, cream, or high-fat meats. Meal planning  Eat meals and snacks regularly, preferably at the same times every day. Avoid going long periods of time without eating.  Eat foods that are high in fiber, such as fresh fruits, vegetables, beans, and whole grains. Talk with your dietitian about how many servings of carbs you can eat at each meal.  Eat 4-6 oz (112-168 g) of lean protein each day, such as lean meat, chicken, fish, eggs, or tofu. One ounce (oz) of lean protein is equal to: ? 1 oz (28 g) of meat, chicken, or fish. ? 1 egg. ?  cup (62 g) of tofu.  Eat some foods each day that contain healthy fats, such as avocado, nuts, seeds, and fish.   What foods should I eat? Fruits Berries. Apples. Oranges. Peaches. Apricots. Plums. Grapes. Mango. Papaya. Pomegranate. Kiwi. Cherries. Vegetables Lettuce. Spinach. Leafy greens, including kale, chard, collard greens, and mustard greens. Beets. Cauliflower. Cabbage. Broccoli. Carrots. Green beans. Tomatoes. Peppers. Onions. Cucumbers. Brussels sprouts. Grains Whole grains, such as whole-wheat or whole-grain bread, crackers, tortillas, cereal, and pasta. Unsweetened oatmeal. Quinoa. Brown or wild rice. Meats and other proteins Seafood. Poultry without skin. Lean cuts of poultry and beef. Tofu. Nuts. Seeds. Dairy Low-fat or fat-free dairy products such as milk, yogurt, and cheese. The items listed above may not be a complete list of foods and beverages you can eat. Contact a dietitian for more information. What foods should I avoid? Fruits Fruits canned with  syrup. Vegetables Canned vegetables. Frozen vegetables with butter or cream sauce. Grains Refined white flour and flour products such as bread, pasta, snack foods, and cereals. Avoid all processed foods. Meats and other proteins Fatty cuts of meat. Poultry with skin. Breaded or fried meats. Processed meat. Avoid saturated fats. Dairy Full-fat yogurt, cheese, or milk. Beverages Sweetened drinks, such as soda or iced tea. The items listed above may not be a complete list of foods and beverages you should avoid. Contact a dietitian for more information. Questions to ask a health care provider  Do I need to meet with a diabetes educator?  Do I need to meet with a dietitian?  What number can I call if I have questions?  When are the best times to check my blood glucose? Where to find more information:  American Diabetes Association: diabetes.org  Academy of Nutrition and Dietetics: www.eatright.org  National Institute of Diabetes and Digestive and Kidney Diseases: www.niddk.nih.gov  Association of Diabetes Care and Education Specialists: www.diabeteseducator.org Summary  It is important to have healthy eating   habits because your blood sugar (glucose) levels are greatly affected by what you eat and drink.  A healthy meal plan will help you control your blood glucose and maintain a healthy lifestyle.  Your health care provider may recommend that you work with a dietitian to make a meal plan that is best for you.  Keep in mind that carbohydrates (carbs) and alcohol have immediate effects on your blood glucose levels. It is important to count carbs and to use alcohol carefully. This information is not intended to replace advice given to you by your health care provider. Make sure you discuss any questions you have with your health care provider. Document Revised: 10/05/2019 Document Reviewed: 10/05/2019 Elsevier Patient Education  2021 Elsevier Inc.  

## 2021-03-15 ENCOUNTER — Encounter: Payer: Self-pay | Admitting: Family Medicine

## 2021-03-15 ENCOUNTER — Ambulatory Visit (INDEPENDENT_AMBULATORY_CARE_PROVIDER_SITE_OTHER): Payer: Medicare HMO | Admitting: Family Medicine

## 2021-03-15 ENCOUNTER — Ambulatory Visit: Payer: Medicare PPO | Admitting: Physician Assistant

## 2021-03-15 ENCOUNTER — Other Ambulatory Visit: Payer: Self-pay

## 2021-03-15 VITALS — BP 110/69 | HR 87 | Temp 98.2°F | Resp 16 | Ht 62.0 in | Wt 231.2 lb

## 2021-03-15 DIAGNOSIS — E119 Type 2 diabetes mellitus without complications: Secondary | ICD-10-CM

## 2021-03-15 DIAGNOSIS — R159 Full incontinence of feces: Secondary | ICD-10-CM | POA: Diagnosis not present

## 2021-03-15 DIAGNOSIS — Z79899 Other long term (current) drug therapy: Secondary | ICD-10-CM | POA: Diagnosis not present

## 2021-03-15 DIAGNOSIS — E785 Hyperlipidemia, unspecified: Secondary | ICD-10-CM | POA: Diagnosis not present

## 2021-03-15 DIAGNOSIS — E1159 Type 2 diabetes mellitus with other circulatory complications: Secondary | ICD-10-CM | POA: Diagnosis not present

## 2021-03-15 DIAGNOSIS — F332 Major depressive disorder, recurrent severe without psychotic features: Secondary | ICD-10-CM | POA: Diagnosis not present

## 2021-03-15 DIAGNOSIS — R946 Abnormal results of thyroid function studies: Secondary | ICD-10-CM | POA: Diagnosis not present

## 2021-03-15 DIAGNOSIS — E1169 Type 2 diabetes mellitus with other specified complication: Secondary | ICD-10-CM | POA: Diagnosis not present

## 2021-03-15 DIAGNOSIS — I152 Hypertension secondary to endocrine disorders: Secondary | ICD-10-CM

## 2021-03-15 LAB — POCT GLYCOSYLATED HEMOGLOBIN (HGB A1C)
Est. average glucose Bld gHb Est-mCnc: 134
Hemoglobin A1C: 6.3 % — AB (ref 4.0–5.6)

## 2021-03-15 MED ORDER — METFORMIN HCL ER 500 MG PO TB24: 500.0000 mg | ORAL_TABLET | Freq: Every day | ORAL | 0 refills | Status: DC

## 2021-03-15 NOTE — Assessment & Plan Note (Signed)
Recent colonoscopy normal BMs during the day can be loose as well Will try to decrease Metformin dose to see if this helps

## 2021-03-15 NOTE — Assessment & Plan Note (Signed)
Discussed importance of healthy weight management Discussed diet and exercise  

## 2021-03-15 NOTE — Assessment & Plan Note (Signed)
Well controlled Continue current medications Recheck metabolic panel F/u in 6 months  

## 2021-03-15 NOTE — Assessment & Plan Note (Signed)
Well controlled °Continue current medications °UTD on vaccines, eye exam, foot exam °On ACEi °On Statin °Discussed diet and exercise °F/u in 6 months  °

## 2021-03-15 NOTE — Progress Notes (Signed)
Established patient visit   Patient: Melinda [name]   DOB: May 24, 1971   50 y.o. Female  MRN: [id number] Visit Date: 03/15/2021  Today's healthcare provider: Lavon Paganini, MD   Chief Complaint  Patient presents with  . Diabetes  . Hypertension  . Depression   Subjective    Diabetes Hypoglycemia symptoms include nervousness/anxiousness. Pertinent negatives for hypoglycemia include no dizziness or headaches. Associated symptoms include fatigue. Pertinent negatives for diabetes include no chest pain, no polydipsia, no polyuria and no weakness.  Hypertension Pertinent negatives include no chest pain, headaches, neck pain, palpitations or shortness of breath.  Depression        Associated symptoms include decreased concentration, fatigue and myalgias.  Associated symptoms include no appetite change and no headaches.   Diarrhea  She says she will have "accidents" in her sleep lasting for 2 to 3 days. However she is using moving her bowels and urinating normally. She has been having issues with diarrea for the past 2 years every 3 months. She says that sometimes she has diarrhea during the day while she is awake.    Mental Health  She sees her psychiatrist every 3 weeks and she believes her sessions are helpful.   Back Pain She is having upper back pain and her symptoms worsens when laying down. She believes this pain may be due to her mattress being old and broken down.   Menopause  She has been experiencing hot-flashes due to onset of menopause.   Diabetes Mellitus Type II, Follow-up  Lab Results  Component Value Date   HGBA1C 6.3 (A) 03/15/2021   HGBA1C 6.6 (A) 11/16/2020   HGBA1C 6.2 (A) 05/16/2020   Wt Readings from Last 3 Encounters:  03/15/21 231 lb 3.2 oz (104.9 kg)  11/16/20 229 lb 8 oz (104.1 kg)  05/16/20 228 lb 6.4 oz (103.6 kg)   Last seen for diabetes 4 months ago.  Management since then includes NO CHANGES. She reports excellent compliance with  treatment. She is not having side effects.  Symptoms: Yes fatigue No foot ulcerations  No appetite changes No nausea  Yes paresthesia of the feet  No polydipsia  No polyuria No visual disturbances   No vomiting     Home blood sugar records: fasting range: 100-120  Episodes of hypoglycemia? No    Current insulin regiment: none Most Recent Eye Exam: no Current exercise: none Current diet habits: in general, a "healthy" diet    Pertinent Labs: Lab Results  Component Value Date   CHOL 133 05/16/2020   HDL 50 05/16/2020   LDLCALC 68 05/16/2020   TRIG 74 05/16/2020   CHOLHDL 2.7 05/16/2020   Lab Results  Component Value Date   NA 140 05/16/2020   K 3.9 05/16/2020   CREATININE 0.86 05/16/2020   GFRNONAA 78 05/16/2020   GFRAA 90 05/16/2020   GLUCOSE 102 (H) 05/16/2020     --------------------------------------------------------------------------------------------------- Hypertension, follow-up  BP Readings from Last 3 Encounters:  03/15/21 110/69  11/16/20 (!) 124/94  05/16/20 128/86   Wt Readings from Last 3 Encounters:  03/15/21 231 lb 3.2 oz (104.9 kg)  11/16/20 229 lb 8 oz (104.1 kg)  05/16/20 228 lb 6.4 oz (103.6 kg)     She was last seen for hypertension 4 months ago.  BP at that visit was 124/94. Management since that visit includes no changes.  She reports excellent compliance with treatment. She is not having side effects.  She is following a Regular diet. She  is not exercising. She does not smoke.  Use of agents associated with hypertension: none.   Outside blood pressures are not being checked. Symptoms: No chest pain No chest pressure  No palpitations No syncope  No dyspnea No orthopnea  No paroxysmal nocturnal dyspnea No lower extremity edema   Pertinent labs: Lab Results  Component Value Date   CHOL 133 05/16/2020   HDL 50 05/16/2020   LDLCALC 68 05/16/2020   TRIG 74 05/16/2020   CHOLHDL 2.7 05/16/2020   Lab Results  Component Value  Date   NA 140 05/16/2020   K 3.9 05/16/2020   CREATININE 0.86 05/16/2020   GFRNONAA 78 05/16/2020   GFRAA 90 05/16/2020   GLUCOSE 102 (H) 05/16/2020     The 10-year ASCVD risk score Mikey Bussing DC Jr., et al., 2013) is: 4.1%   --------------------------------------------------------------------------------------------------- Depression, Follow-up  She  was last seen for this 4 months ago. Changes made at last visit include no changes followed by Dr. Weber Cooks.   She reports excellent compliance with treatment. She is not having side effects.  She reports excellent tolerance of treatment. Current symptoms include: depressed mood, difficulty concentrating, feelings of worthlessness/guilt and insomnia She feels she is Unchanged since last visit.  Depression screen [facility] 2/9 03/15/2021 11/28/2020 05/13/2019  Decreased Interest 1 1 2   Down, Depressed, Hopeless 1 1 2   PHQ - 2 Score 2 2 4   Altered sleeping 1 1 2   Tired, decreased energy 1 1 1   Change in appetite 0 0 1  Feeling bad or failure about yourself  1 1 1   Trouble concentrating 0 1 1  Moving slowly or fidgety/restless 0 0 1  Suicidal thoughts 0 0 0  PHQ-9 Score 5 6 11   Difficult doing work/chores Somewhat difficult Not difficult at all Not difficult at all  Some recent data might be hidden    -----------------------------------------------------------------------------------------   Patient Active Problem List   Diagnosis Date Noted  . Encopresis 03/15/2021  . Encounter for screening colonoscopy   . Affective bipolar disorder ([place]) 04/28/2015  . Cardiomyopathy ([place]) 04/28/2015  . Hyperlipidemia associated with type 2 diabetes mellitus ([place]) 04/28/2015  . Hypertension associated with diabetes ([place]) 04/28/2015  . Morbid obesity ([place]) 04/28/2015  . Disorder of peripheral nervous system 04/28/2015  . Severe depression ([place]) 04/28/2015  . Apnea, sleep 04/28/2015  . Attempted suicide ([place]) 04/28/2015  . Major depressive disorder,  recurrent severe without psychotic features ([place])   . Diabetes mellitus type II, controlled ([place]) 09/09/2007  . DEPRESSION 09/09/2007  . PERIPHERAL NEUROPATHY 09/09/2007  . GERD 09/09/2007   Social History   Tobacco Use  . Smoking status: Never Smoker  . Smokeless tobacco: Never Used  Vaping Use  . Vaping Use: Never used  Substance Use Topics  . Alcohol use: No  . Drug use: No   Allergies  Allergen Reactions  . Prednisone     Increases blood sugar       Medications: Outpatient Medications Prior to Visit  Medication Sig  . Accu-Chek Softclix Lancets lancets USE AS DIRECTED  . Alcohol Swabs (B-D SINGLE USE SWABS REGULAR) PADS USE AS DIRECTED  . atorvastatin (LIPITOR) 10 MG tablet TAKE 1 TABLET AT BEDTIME  . Blood Glucose Monitoring Suppl (TRUE METRIX METER) w/Device KIT USE AS DIRECTED AS NEEDED  FOR  DIABETES  MELLITUS  TYPE 2  . buPROPion (WELLBUTRIN XL) 300 MG 24 hr tablet TAKE 1 TABLET EVERY DAY  . escitalopram (LEXAPRO) 20 MG tablet Take 1 tablet (20  mg total) by mouth daily.  Marland Kitchen lisinopril (ZESTRIL) 10 MG tablet TAKE 1 TABLET EVERY DAY  . meloxicam (MOBIC) 7.5 MG tablet Take 1 tablet (7.5 mg total) by mouth daily.  . ziprasidone (GEODON) 80 MG capsule TAKE 1 CAPSULE TWO TIMES DAILY WITH A MEAL.  . [DISCONTINUED] metFORMIN (GLUCOPHAGE-XR) 500 MG 24 hr tablet Take 1 tablet (500 mg total) by mouth 2 (two) times daily.  . [DISCONTINUED] buPROPion (WELLBUTRIN XL) 300 MG 24 hr tablet Take 1 tablet (300 mg total) by mouth daily.  . [DISCONTINUED] escitalopram (LEXAPRO) 20 MG tablet TAKE 1 TABLET EVERY DAY (Patient not taking: Reported on 03/15/2021)  . [DISCONTINUED] glucose blood (ACCU-CHEK AVIVA PLUS) test strip CHECK FASTING BLOOD SUGARS EVERY MORNING  . [DISCONTINUED] Lancets Misc. (ACCU-CHEK FASTCLIX LANCET) KIT   . [DISCONTINUED] ziprasidone (GEODON) 80 MG capsule Twice a day   No facility-administered medications prior to visit.    Review of Systems  Constitutional:  Positive for fatigue. Negative for appetite change, chills and unexpected weight change.  HENT: Negative for ear pain, rhinorrhea, sinus pain and sore throat.   Eyes: Negative for pain and visual disturbance.  Respiratory: Negative for choking, chest tightness and shortness of breath.   Cardiovascular: Negative for chest pain, palpitations and leg swelling.  Gastrointestinal: Positive for diarrhea. Negative for abdominal pain, anal bleeding, constipation, nausea and vomiting.  Endocrine: Negative for polydipsia and polyuria.  Genitourinary: Negative for difficulty urinating, flank pain, frequency, hematuria, urgency and vaginal pain.  Musculoskeletal: Positive for back pain and myalgias. Negative for neck pain and neck stiffness.  Neurological: Negative for dizziness, weakness, light-headedness, numbness and headaches.  Psychiatric/Behavioral: Positive for decreased concentration, depression and sleep disturbance. Negative for agitation. The patient is nervous/anxious.         Objective    BP 110/69 (BP Location: Right Arm, Patient Position: Sitting, Cuff Size: Large)   Pulse 87   Temp 98.2 F (36.8 C) (Oral)   Resp 16   Ht $R'5\' 2"'bp$  (1.575 m)   Wt 231 lb 3.2 oz (104.9 kg)   LMP  (LMP Unknown) Comment: pt states she has not had period in the past year  SpO2 99%   BMI 42.29 kg/m  BP Readings from Last 3 Encounters:  03/15/21 110/69  11/16/20 (!) 124/94  05/16/20 128/86   Wt Readings from Last 3 Encounters:  03/15/21 231 lb 3.2 oz (104.9 kg)  11/16/20 229 lb 8 oz (104.1 kg)  05/16/20 228 lb 6.4 oz (103.6 kg)      Physical Exam Vitals reviewed.  Constitutional:      General: She is not in acute distress.    Appearance: Normal appearance. She is well-developed. She is not diaphoretic.  HENT:     Head: Normocephalic and atraumatic.  Eyes:     General: No scleral icterus.    Conjunctiva/sclera: Conjunctivae normal.  Neck:     Thyroid: No thyromegaly.  Cardiovascular:      Rate and Rhythm: Normal rate and regular rhythm.     Pulses: Normal pulses.     Heart sounds: Normal heart sounds. No murmur heard.   Pulmonary:     Effort: Pulmonary effort is normal. No respiratory distress.     Breath sounds: Normal breath sounds. No wheezing, rhonchi or rales.  Musculoskeletal:     Cervical back: Neck supple.     Right lower leg: No edema.     Left lower leg: No edema.  Lymphadenopathy:     Cervical: No cervical adenopathy.  Skin:    General: Skin is warm and dry.     Findings: No rash.  Neurological:     Mental Status: She is alert and oriented to person, place, and time. Mental status is at baseline.  Psychiatric:        Mood and Affect: Mood normal.        Behavior: Behavior normal.       Results for orders placed or performed in visit on 03/15/21  POCT glycosylated hemoglobin (Hb A1C)  Result Value Ref Range   Hemoglobin A1C 6.3 (A) 4.0 - 5.6 %   Est. average glucose Bld gHb Est-mCnc 134     Assessment & Plan     Problem List Items Addressed This Visit      Cardiovascular and Mediastinum   Hypertension associated with diabetes ([place])    Well controlled Continue current medications Recheck metabolic panel F/u in 6 months       Relevant Medications   metFORMIN (GLUCOPHAGE-XR) 500 MG 24 hr tablet   Other Relevant Orders   Comprehensive metabolic panel     Endocrine   Diabetes mellitus type II, controlled ([place]) - Primary    Well controlled Continue current medications UTD on vaccines, eye exam, foot exam On ACEi On Statin Discussed diet and exercise F/u in 6 months       Relevant Medications   metFORMIN (GLUCOPHAGE-XR) 500 MG 24 hr tablet   Other Relevant Orders   POCT glycosylated hemoglobin (Hb A1C) (Completed)   Hyperlipidemia associated with type 2 diabetes mellitus (HCC)    Previously well controlled Continue statin Repeat FLP and CMP Goal LDL < 70      Relevant Medications   metFORMIN (GLUCOPHAGE-XR) 500 MG 24 hr  tablet   Other Relevant Orders   Lipid panel   Comprehensive metabolic panel     Other   Major depressive disorder, recurrent severe without psychotic features ([place])    Followed by psych No changes to meds today Fairly well controlled Doing ECT      Morbid obesity ([place])    Discussed importance of healthy weight management Discussed diet and exercise       Relevant Medications   metFORMIN (GLUCOPHAGE-XR) 500 MG 24 hr tablet   Other Relevant Orders   Lipid panel   Comprehensive metabolic panel   CBC w/Diff/Platelet   TSH   Encopresis    Recent colonoscopy normal BMs during the day can be loose as well Will try to decrease Metformin dose to see if this helps       Other Visit Diagnoses    Long-term use of high-risk medication       Relevant Orders   CBC w/Diff/Platelet   TSH   Diabetes mellitus without complication (HCC)       Relevant Medications   metFORMIN (GLUCOPHAGE-XR) 500 MG 24 hr tablet      Return in about 8 months (around 11/15/2021) for CPE, AWV.      I,Essence Turner,acting as a Education administrator for Lavon Paganini, MD.,have documented all relevant documentation on the behalf of Lavon Paganini, MD,as directed by  Lavon Paganini, MD while in the presence of Lavon Paganini, MD.   I, Lavon Paganini, MD, have reviewed all documentation for this visit. The documentation on 03/15/21 for the exam, diagnosis, procedures, and orders are all accurate and complete.   Nattalie Santiesteban, Dionne Bucy, MD, MPH [facility]

## 2021-03-15 NOTE — Assessment & Plan Note (Signed)
Previously well controlled Continue statin Repeat FLP and CMP Goal LDL < 70 

## 2021-03-15 NOTE — Assessment & Plan Note (Signed)
Followed by psych No changes to meds today Fairly well controlled Doing ECT

## 2021-03-16 ENCOUNTER — Telehealth: Payer: Self-pay

## 2021-03-16 LAB — COMPREHENSIVE METABOLIC PANEL
ALT: 15 IU/L (ref 0–32)
AST: 15 IU/L (ref 0–40)
Albumin/Globulin Ratio: 1.7 (ref 1.2–2.2)
Albumin: 4.5 g/dL (ref 3.8–4.9)
Alkaline Phosphatase: 125 IU/L — ABNORMAL HIGH (ref 44–121)
BUN/Creatinine Ratio: 16 (ref 9–23)
BUN: 13 mg/dL (ref 6–24)
Bilirubin Total: 0.2 mg/dL (ref 0.0–1.2)
CO2: 20 mmol/L (ref 20–29)
Calcium: 10 mg/dL (ref 8.7–10.2)
Chloride: 102 mmol/L (ref 96–106)
Creatinine, Ser: 0.79 mg/dL (ref 0.57–1.00)
Globulin, Total: 2.6 g/dL (ref 1.5–4.5)
Glucose: 144 mg/dL — ABNORMAL HIGH (ref 65–99)
Potassium: 4.8 mmol/L (ref 3.5–5.2)
Sodium: 139 mmol/L (ref 134–144)
Total Protein: 7.1 g/dL (ref 6.0–8.5)
eGFR: 90 mL/min/{1.73_m2} (ref >59)

## 2021-03-16 LAB — CBC WITH DIFFERENTIAL/PLATELET
Basophils Absolute: 0 10*3/uL (ref 0.0–0.2)
Basos: 1 %
EOS (ABSOLUTE): 0.1 10*3/uL (ref 0.0–0.4)
Eos: 1 %
Hematocrit: 39.1 % (ref 34.0–46.6)
Hemoglobin: 12.5 g/dL (ref 11.1–15.9)
Immature Grans (Abs): 0 10*3/uL (ref 0.0–0.1)
Immature Granulocytes: 0 %
Lymphocytes Absolute: 2.3 10*3/uL (ref 0.7–3.1)
Lymphs: 30 %
MCH: 26.1 pg — ABNORMAL LOW (ref 26.6–33.0)
MCHC: 32 g/dL (ref 31.5–35.7)
MCV: 82 fL (ref 79–97)
Monocytes Absolute: 0.7 10*3/uL (ref 0.1–0.9)
Monocytes: 9 %
Neutrophils Absolute: 4.5 10*3/uL (ref 1.4–7.0)
Neutrophils: 59 %
Platelets: 321 10*3/uL (ref 150–450)
RBC: 4.79 x10E6/uL (ref 3.77–5.28)
RDW: 12 % (ref 11.7–15.4)
WBC: 7.6 10*3/uL (ref 3.4–10.8)

## 2021-03-16 LAB — LIPID PANEL
Chol/HDL Ratio: 2.9 ratio (ref 0.0–4.4)
Cholesterol, Total: 155 mg/dL (ref 100–199)
HDL: 53 mg/dL (ref >39)
LDL Chol Calc (NIH): 82 mg/dL (ref 0–99)
Triglycerides: 113 mg/dL (ref 0–149)
VLDL Cholesterol Cal: 20 mg/dL (ref 5–40)

## 2021-03-16 LAB — TSH: TSH: 4.94 u[IU]/mL — ABNORMAL HIGH (ref 0.450–4.500)

## 2021-03-16 NOTE — Telephone Encounter (Signed)
-----   Message from Melinda Crews, MD sent at 03/16/2021  8:10 AM EDT ----- Normal/stable labs, except TSH is slightly elevated.  Please add on free T4 for confirmation.  We will let the patient know more about her thyroid normally have this confirmatory test back.

## 2021-03-16 NOTE — Telephone Encounter (Signed)
Patient was advised and verbalized understanding. Also add on lab was faxed to [facility].

## 2021-03-20 ENCOUNTER — Telehealth: Payer: Self-pay

## 2021-03-20 DIAGNOSIS — E1169 Type 2 diabetes mellitus with other specified complication: Secondary | ICD-10-CM

## 2021-03-20 DIAGNOSIS — E785 Hyperlipidemia, unspecified: Secondary | ICD-10-CM

## 2021-03-20 LAB — T4: T4, Total: 10.2 ug/dL (ref 4.5–12.0)

## 2021-03-20 LAB — SPECIMEN STATUS REPORT

## 2021-03-20 MED ORDER — ATORVASTATIN CALCIUM 10 MG PO TABS: 10.0000 mg | ORAL_TABLET | Freq: Every day | ORAL | 0 refills | Status: DC

## 2021-03-20 NOTE — Telephone Encounter (Signed)
Patient was eligible for refill as her OV and lipid panel were done a few days ago. Patient Rx sent in.

## 2021-03-20 NOTE — Telephone Encounter (Signed)
[facility] faxed refill request for the following medications:  atorvastatin (LIPITOR) 10 MG tablet   Please advise.

## 2021-03-22 ENCOUNTER — Other Ambulatory Visit: Payer: Self-pay | Admitting: Psychiatry

## 2021-03-23 ENCOUNTER — Other Ambulatory Visit: Payer: Self-pay

## 2021-03-23 ENCOUNTER — Encounter
Admission: RE | Admit: 2021-03-23 | Discharge: 2021-03-23 | Disposition: A | Discharge status: Home / Self Care | Payer: Medicare HMO | Source: Ambulatory Visit | Attending: Psychiatry | Admitting: Psychiatry

## 2021-03-23 ENCOUNTER — Encounter: Payer: Self-pay | Admitting: Registered Nurse

## 2021-03-23 ENCOUNTER — Ambulatory Visit: Payer: Self-pay | Admitting: Registered Nurse

## 2021-03-23 DIAGNOSIS — F332 Major depressive disorder, recurrent severe without psychotic features: Secondary | ICD-10-CM | POA: Diagnosis not present

## 2021-03-23 DIAGNOSIS — F339 Major depressive disorder, recurrent, unspecified: Secondary | ICD-10-CM | POA: Diagnosis not present

## 2021-03-23 DIAGNOSIS — I429 Cardiomyopathy, unspecified: Secondary | ICD-10-CM | POA: Diagnosis not present

## 2021-03-23 LAB — GLUCOSE, CAPILLARY
Glucose-Capillary: 120 mg/dL — ABNORMAL HIGH (ref 70–99)
Glucose-Capillary: 139 mg/dL — ABNORMAL HIGH (ref 70–99)

## 2021-03-23 MED ORDER — LABETALOL HCL 5 MG/ML IV SOLN
INTRAVENOUS | Status: DC | PRN
  Administered 2021-03-23: 30 mg via INTRAVENOUS

## 2021-03-23 MED ORDER — KETOROLAC TROMETHAMINE 30 MG/ML IJ SOLN
INTRAMUSCULAR | Status: AC
  Administered 2021-03-23: 30 mg via INTRAVENOUS
  Filled 2021-03-23: qty 1

## 2021-03-23 MED ORDER — GLYCOPYRROLATE 0.2 MG/ML IJ SOLN: 0.4000 mg | Freq: Once | INTRAMUSCULAR | Status: AC

## 2021-03-23 MED ORDER — SODIUM CHLORIDE 0.9 % IV SOLN
500.0000 mL | Freq: Once | INTRAVENOUS | Status: AC
  Administered 2021-03-23: 500 mL via INTRAVENOUS

## 2021-03-23 MED ORDER — ESMOLOL HCL 100 MG/10ML IV SOLN
INTRAVENOUS | Status: DC | PRN
  Administered 2021-03-23: 20 mg via INTRAVENOUS

## 2021-03-23 MED ORDER — GLYCOPYRROLATE 0.2 MG/ML IJ SOLN
INTRAMUSCULAR | Status: AC
  Administered 2021-03-23: 0.4 mg via INTRAVENOUS
  Filled 2021-03-23: qty 2

## 2021-03-23 MED ORDER — FENTANYL CITRATE (PF) 100 MCG/2ML IJ SOLN: 25.0000 ug | INTRAMUSCULAR | Status: DC | PRN

## 2021-03-23 MED ORDER — ONDANSETRON HCL 4 MG/2ML IJ SOLN: 4.0000 mg | Freq: Once | INTRAMUSCULAR | Status: DC | PRN

## 2021-03-23 MED ORDER — SODIUM CHLORIDE 0.9 % IV SOLN: INTRAVENOUS | Status: DC | PRN

## 2021-03-23 MED ORDER — KETOROLAC TROMETHAMINE 30 MG/ML IJ SOLN: 30.0000 mg | Freq: Once | INTRAMUSCULAR | Status: AC

## 2021-03-23 MED ORDER — SUCCINYLCHOLINE CHLORIDE 20 MG/ML IJ SOLN
INTRAMUSCULAR | Status: DC | PRN
  Administered 2021-03-23: 100 mg via INTRAVENOUS

## 2021-03-23 MED ORDER — METHOHEXITAL SODIUM 100 MG/10ML IV SOSY
PREFILLED_SYRINGE | INTRAVENOUS | Status: DC | PRN
  Administered 2021-03-23: 70 mg via INTRAVENOUS

## 2021-03-23 NOTE — Procedures (Signed)
ECT SERVICES Physician's Interval Evaluation & Treatment Note  Patient Identification: Melinda [name] MRN:  [id number] Date of Evaluation:  03/23/2021 TX #: [id number]  MADRS:   MMSE:   P.E. Findings:  No change to physical exam  Psychiatric Interval Note:  Mood stable no psychosis or suicidal ideation.  Chronic dysphoria  Subjective:  Patient is a 50 y.o. female seen for evaluation for Electroconvulsive Therapy. Chronic dysphoria no worse than usual  Treatment Summary:   []   Right Unilateral             [x]  Bilateral   % Energy : 1.0 ms 35%   Impedance: 1710 ohms  Seizure Energy Index: 5162 V squared  Postictal Suppression Index: 89%  Seizure Concordance Index: 95%  Medications  Pre Shock: Robinul 0.4 mg labetalol 20 mg esmolol 20 mg Toradol 30 mg Brevital 70 mg succinylcholine 100 mg  Post Shock:    Seizure Duration: 31 seconds EMG 78 seconds EEG   Comments: Tolerated well as usual.  Return in 3 weeks  Lungs:  [x]   Clear to auscultation               []  Other:   Heart:    [x]   Regular rhythm             []  irregular rhythm    [x]   Previous H&P reviewed, patient examined and there are NO CHANGES                 []   Previous H&P reviewed, patient examined and there are changes noted.   [name], MD 5/13/20224:29 PM

## 2021-03-23 NOTE — Anesthesia Preprocedure Evaluation (Signed)
Anesthesia Evaluation  Patient identified by MRN, date of birth, ID band Patient awake    Reviewed: Allergy & Precautions, H&P , NPO status , Patient's Chart, lab work & pertinent test results, reviewed documented beta blocker date and time   Airway Mallampati: II   Neck ROM: full    Dental  (+) Teeth Intact   Pulmonary sleep apnea ,    Pulmonary exam normal        Cardiovascular Exercise Tolerance: Poor hypertension, On Medications negative cardio ROS Normal cardiovascular exam Rhythm:regular Rate:Normal     Neuro/Psych PSYCHIATRIC DISORDERS Depression Bipolar Disorder  Neuromuscular disease    GI/Hepatic Neg liver ROS, GERD  Medicated,  Endo/Other  negative endocrine ROSdiabetes, Well Controlled, Type 2, Oral Hypoglycemic Agents  Renal/GU negative Renal ROS  negative genitourinary   Musculoskeletal   Abdominal   Peds  Hematology negative hematology ROS (+)   Anesthesia Other Findings Past Medical History: No date: Depression No date: Diabetes mellitus without complication ([place]) 8/46/96: Diabetic peripheral neuropathy ([place]) 03/07/15: Diabetic peripheral neuropathy ([place]) 03/07/15: Diabetic peripheral neuropathy ([facility]) No date: GERD (gastroesophageal reflux disease) 03/07/15: Hypercholesterolemia No date: Hypertension 03/07/15: Obesity 03/07/15: Personality disorder ([place]) 03/07/15: Sinus tachycardia     Comment:  history of 03/07/15: Suicidal thoughts Past Surgical History: 12/01/2018: COLONOSCOPY WITH PROPOFOL; N/A     Comment:  Procedure: COLONOSCOPY WITH PROPOFOL;  Surgeon: Jonathon Bellows, MD;  Location: [facility] ENDOSCOPY;  Service:               Gastroenterology;  Laterality: N/A; 03/07/15: electroconvulsion therapy BMI    Body Mass Index: 42.29 kg/m     Reproductive/Obstetrics negative OB ROS                             Anesthesia Physical Anesthesia Plan  ASA:  III  Anesthesia Plan: General   Post-op Pain Management:    Induction:   PONV Risk Score and Plan:   Airway Management Planned:   Additional Equipment:   Intra-op Plan:   Post-operative Plan:   Informed Consent: I have reviewed the patients History and Physical, chart, labs and discussed the procedure including the risks, benefits and alternatives for the proposed anesthesia with the patient or authorized representative who has indicated his/her understanding and acceptance.     Dental Advisory Given  Plan Discussed with: CRNA  Anesthesia Plan Comments:         Anesthesia Quick Evaluation

## 2021-03-23 NOTE — H&P (Signed)
Melinda [name] is an 50 y.o. female.   Chief Complaint: Patient has no specific chief complaint HPI: History of recurrent depression with control in part assisted by ECT  Past Medical History:  Diagnosis Date  . Depression   . Diabetes mellitus without complication ([place])   . Diabetic peripheral neuropathy ([place]) 03/07/15  . Diabetic peripheral neuropathy ([place]) 03/07/15  . Diabetic peripheral neuropathy ([place]) 03/07/15  . GERD (gastroesophageal reflux disease)   . Hypercholesterolemia 03/07/15  . Hypertension   . Obesity 03/07/15  . Personality disorder ([place]) 03/07/15  . Sinus tachycardia 03/07/15   history of  . Suicidal thoughts 03/07/15    Past Surgical History:  Procedure Laterality Date  . COLONOSCOPY WITH PROPOFOL N/A 12/01/2018   Procedure: COLONOSCOPY WITH PROPOFOL;  Surgeon: Jonathon Bellows, MD;  Location: [facility] ENDOSCOPY;  Service: Gastroenterology;  Laterality: N/A;  . electroconvulsion therapy  03/07/15    Family History  Problem Relation Age of Onset  . Hypertension Father   . Diabetes Mother    Social History:  reports that she has never smoked. She has never used smokeless tobacco. She reports that she does not drink alcohol and does not use drugs.  Allergies:  Allergies  Allergen Reactions  . Prednisone     Increases blood sugar    (Not in a hospital admission)   Results for orders placed or performed during the hospital encounter of 03/23/21 (from the past 48 hour(s))  Glucose, capillary     Status: Abnormal   Collection Time: 03/23/21  9:56 AM  Result Value Ref Range   Glucose-Capillary 120 (H) 70 - 99 mg/dL    Comment: Glucose reference range applies only to samples taken after fasting for at least 8 hours.  Glucose, capillary     Status: Abnormal   Collection Time: 03/23/21  1:21 PM  Result Value Ref Range   Glucose-Capillary 139 (H) 70 - 99 mg/dL    Comment: Glucose reference range applies only to samples taken after fasting for at least 8 hours.   No  results found.  Review of Systems  Constitutional: Negative.   HENT: Negative.   Eyes: Negative.   Respiratory: Negative.   Cardiovascular: Negative.   Gastrointestinal: Negative.   Musculoskeletal: Negative.   Skin: Negative.   Neurological: Negative.   Psychiatric/Behavioral: Negative.     Blood pressure (!) 148/85, pulse 98, temperature 98.1 F (36.7 C), temperature source Tympanic, resp. rate (!) 24, height 5\' 2"  (1.575 m), weight 104.9 kg, SpO2 99 %. Physical Exam Vitals and nursing note reviewed.  Constitutional:      Appearance: She is well-developed.  HENT:     Head: Normocephalic and atraumatic.  Eyes:     Conjunctiva/sclera: Conjunctivae normal.     Pupils: Pupils are equal, round, and reactive to light.  Cardiovascular:     Heart sounds: Normal heart sounds.  Pulmonary:     Effort: Pulmonary effort is normal.  Abdominal:     Palpations: Abdomen is soft.  Musculoskeletal:        General: Normal range of motion.     Cervical back: Normal range of motion.  Skin:    General: Skin is warm and dry.  Neurological:     Mental Status: She is alert.  Psychiatric:        Mood and Affect: Mood normal.        Behavior: Behavior normal.        Thought Content: Thought content normal.  Judgment: Judgment normal.      Assessment/Plan ECT treatment today as usual and follow-up in 3 weeks.  [name], MD 03/23/2021, 4:28 PM

## 2021-03-23 NOTE — Transfer of Care (Signed)
444Immediate Anesthesia Transfer of Care Note  Patient: Melinda [name]  Procedure(s) Performed: ECT TX  Patient Location: PACU  Anesthesia Type:General  Level of Consciousness: drowsy  Airway & Oxygen Therapy: Patient Spontanous Breathing  Post-op Assessment: Report given to RN and Post -op Vital signs reviewed and stable  Post vital signs: Reviewed and stable  Last Vitals:  Vitals Value Taken Time  BP 141/105 03/23/21 1302  Temp    Pulse 103 03/23/21 1309  Resp 18 03/23/21 1309  SpO2 97 % 03/23/21 1309  Vitals shown include unvalidated device data.  Last Pain:  Vitals:   03/23/21 0955  TempSrc:   PainSc: 0-No pain         Complications: No complications documented.

## 2021-03-24 NOTE — Anesthesia Postprocedure Evaluation (Signed)
Anesthesia Post Note  Patient: Melinda [name]  Procedure(s) Performed: ECT TX  Patient location during evaluation: PACU Anesthesia Type: General Level of consciousness: awake and alert Pain management: pain level controlled Vital Signs Assessment: post-procedure vital signs reviewed and stable Respiratory status: spontaneous breathing, nonlabored ventilation, respiratory function stable and patient connected to nasal cannula oxygen Cardiovascular status: blood pressure returned to baseline and stable Postop Assessment: no apparent nausea or vomiting Anesthetic complications: no   No complications documented.   Last Vitals:  Vitals:   03/23/21 1330 03/23/21 1357  BP: (!) 155/93 (!) 148/85  Pulse: 98 98  Resp: (!) 28 (!) 24  Temp:  36.7 C  SpO2: 99%     Last Pain:  Vitals:   03/23/21 1357  TempSrc: Tympanic  PainSc: 0-No pain                 Molli Barrows

## 2021-04-10 ENCOUNTER — Telehealth: Payer: Self-pay | Admitting: Family Medicine

## 2021-04-10 DIAGNOSIS — I10 Essential (primary) hypertension: Secondary | ICD-10-CM

## 2021-04-10 MED ORDER — LISINOPRIL 10 MG PO TABS: 1.0000 | ORAL_TABLET | Freq: Every day | ORAL | 3 refills | Status: DC

## 2021-04-10 NOTE — Telephone Encounter (Signed)
[facility] faxed refill request for the following medications:  lisinopril (ZESTRIL) 10 MG tablet  Last Rx: 01/29/21 Qty: 90 Refills: 0 LOV: 03/15/21 with Dr. B Please advise. Thanks TNP

## 2021-04-13 ENCOUNTER — Other Ambulatory Visit: Payer: Self-pay | Admitting: Psychiatry

## 2021-04-13 ENCOUNTER — Other Ambulatory Visit: Payer: Self-pay

## 2021-04-13 ENCOUNTER — Encounter: Payer: Self-pay | Admitting: Anesthesiology

## 2021-04-13 ENCOUNTER — Ambulatory Visit: Payer: Self-pay | Admitting: Anesthesiology

## 2021-04-13 ENCOUNTER — Encounter
Admission: RE | Admit: 2021-04-13 | Discharge: 2021-04-13 | Disposition: A | Discharge status: Home / Self Care | Payer: Medicare HMO | Source: Ambulatory Visit | Attending: Psychiatry | Admitting: Psychiatry

## 2021-04-13 DIAGNOSIS — Z79899 Other long term (current) drug therapy: Secondary | ICD-10-CM | POA: Diagnosis not present

## 2021-04-13 DIAGNOSIS — Z888 Allergy status to other drugs, medicaments and biological substances status: Secondary | ICD-10-CM | POA: Diagnosis not present

## 2021-04-13 DIAGNOSIS — F332 Major depressive disorder, recurrent severe without psychotic features: Secondary | ICD-10-CM

## 2021-04-13 DIAGNOSIS — Z791 Long term (current) use of non-steroidal anti-inflammatories (NSAID): Secondary | ICD-10-CM | POA: Diagnosis not present

## 2021-04-13 DIAGNOSIS — Z6836 Body mass index (BMI) 36.0-36.9, adult: Secondary | ICD-10-CM | POA: Diagnosis not present

## 2021-04-13 DIAGNOSIS — F339 Major depressive disorder, recurrent, unspecified: Secondary | ICD-10-CM | POA: Diagnosis not present

## 2021-04-13 DIAGNOSIS — Z833 Family history of diabetes mellitus: Secondary | ICD-10-CM | POA: Diagnosis not present

## 2021-04-13 DIAGNOSIS — E669 Obesity, unspecified: Secondary | ICD-10-CM | POA: Diagnosis not present

## 2021-04-13 DIAGNOSIS — Z8249 Family history of ischemic heart disease and other diseases of the circulatory system: Secondary | ICD-10-CM | POA: Diagnosis not present

## 2021-04-13 LAB — GLUCOSE, CAPILLARY: Glucose-Capillary: 147 mg/dL — ABNORMAL HIGH (ref 70–99)

## 2021-04-13 MED ORDER — GLYCOPYRROLATE 0.2 MG/ML IJ SOLN
INTRAMUSCULAR | Status: AC
  Administered 2021-04-13: 0.4 mg via INTRAVENOUS
  Filled 2021-04-13: qty 2

## 2021-04-13 MED ORDER — METHOHEXITAL SODIUM 100 MG/10ML IV SOSY
PREFILLED_SYRINGE | INTRAVENOUS | Status: DC | PRN
  Administered 2021-04-13: 70 mg via INTRAVENOUS

## 2021-04-13 MED ORDER — GLYCOPYRROLATE 0.2 MG/ML IJ SOLN: 0.4000 mg | Freq: Once | INTRAMUSCULAR | Status: AC

## 2021-04-13 MED ORDER — SODIUM CHLORIDE 0.9 % IV SOLN
500.0000 mL | Freq: Once | INTRAVENOUS | Status: AC
  Administered 2021-04-13: 500 mL via INTRAVENOUS

## 2021-04-13 MED ORDER — ESMOLOL HCL 100 MG/10ML IV SOLN
INTRAVENOUS | Status: DC | PRN
  Administered 2021-04-13: 20 mg via INTRAVENOUS

## 2021-04-13 MED ORDER — SUCCINYLCHOLINE CHLORIDE 20 MG/ML IJ SOLN
INTRAMUSCULAR | Status: DC | PRN
  Administered 2021-04-13: 100 mg via INTRAVENOUS

## 2021-04-13 MED ORDER — KETOROLAC TROMETHAMINE 30 MG/ML IJ SOLN
INTRAMUSCULAR | Status: AC
  Administered 2021-04-13: 30 mg via INTRAVENOUS
  Filled 2021-04-13: qty 1

## 2021-04-13 MED ORDER — LABETALOL HCL 5 MG/ML IV SOLN
INTRAVENOUS | Status: DC | PRN
  Administered 2021-04-13: 30 mg via INTRAVENOUS

## 2021-04-13 MED ORDER — KETOROLAC TROMETHAMINE 30 MG/ML IJ SOLN: 30.0000 mg | Freq: Once | INTRAMUSCULAR | Status: AC

## 2021-04-13 MED ORDER — SODIUM CHLORIDE 0.9 % IV SOLN: INTRAVENOUS | Status: DC | PRN

## 2021-04-13 NOTE — Transfer of Care (Signed)
Immediate Anesthesia Transfer of Care Note  Patient: Melinda [name]  Procedure(s) Performed: ECT TX  Patient Location: PACU  Anesthesia Type:General  Level of Consciousness: drowsy  Airway & Oxygen Therapy: Patient Spontanous Breathing and Patient connected to face mask oxygen  Post-op Assessment: Report given to RN and Post -op Vital signs reviewed and stable  Post vital signs: Reviewed and stable  Last Vitals:  Vitals Value Taken Time  BP    Temp    Pulse 98 04/13/21 1336  Resp 30 04/13/21 1336  SpO2 95 % 04/13/21 1336  Vitals shown include unvalidated device data.  Last Pain:  Vitals:   04/13/21 1148  TempSrc:   PainSc: 0-No pain         Complications: No complications documented.

## 2021-04-13 NOTE — Anesthesia Postprocedure Evaluation (Signed)
Anesthesia Post Note  Patient: Clytee M Wos  Procedure(s) Performed: ECT TX  Patient location during evaluation: PACU Anesthesia Type: General Level of consciousness: awake and alert Pain management: pain level controlled Vital Signs Assessment: post-procedure vital signs reviewed and stable Respiratory status: spontaneous breathing, nonlabored ventilation and respiratory function stable Cardiovascular status: blood pressure returned to baseline and stable Postop Assessment: no signs of nausea or vomiting Anesthetic complications: no   No complications documented.   Last Vitals:  Vitals:   04/13/21 1400 04/13/21 1406  BP: 121/81 123/70  Pulse: 89 89  Resp: 20 20  Temp: 36.9 C 36.9 C  SpO2: 96%     Last Pain:  Vitals:   04/13/21 1406  TempSrc: Oral  PainSc: 0-No pain                 Jaquawn Saffran

## 2021-04-13 NOTE — Anesthesia Preprocedure Evaluation (Signed)
Anesthesia Evaluation  Patient identified by MRN, date of birth, ID band Patient awake    Reviewed: Allergy & Precautions, H&P , NPO status , Patient's Chart, lab work & pertinent test results  History of Anesthesia Complications Negative for: history of anesthetic complications  Airway Mallampati: II  TM Distance: >3 FB Neck ROM: full    Dental  (+) Poor Dentition, Chipped   Pulmonary sleep apnea , neg COPD,    Pulmonary exam normal breath sounds clear to auscultation       Cardiovascular hypertension, Pt. on medications (-) CAD and (-) Past MI negative cardio ROS Normal cardiovascular exam Rhythm:regular Rate:Normal     Neuro/Psych PSYCHIATRIC DISORDERS Depression Bipolar Disorder  Neuromuscular disease negative neurological ROS     GI/Hepatic negative GI ROS, Neg liver ROS, GERD  Medicated,  Endo/Other  diabetes, Type 2, Oral Hypoglycemic Agents  Renal/GU negative Renal ROS  negative genitourinary   Musculoskeletal   Abdominal (+) + obese,   Peds  Hematology negative hematology ROS (+)   Anesthesia Other Findings Past Medical History: No date: Depression No date: Diabetes mellitus without complication (HCC) [birth year]: Diabetic peripheral neuropathy ([place]) 03/07/15: Diabetic peripheral neuropathy ([place]) 03/07/15: Diabetic peripheral neuropathy (HCC) No date: GERD (gastroesophageal reflux disease) 03/07/15: Hypercholesterolemia No date: Hypertension 03/07/15: Obesity 03/07/15: Personality disorder 03/07/15: Sinus tachycardia ([place])     Comment: history of 03/07/15: Suicidal thoughts Past Surgical History: 03/07/15: electroconvulsion therapy BMI    Body Mass Index:  36.44 kg/m     Reproductive/Obstetrics                             Anesthesia Physical  Anesthesia Plan  ASA: III  Anesthesia Plan: General   Post-op Pain Management:    Induction: Intravenous  PONV Risk Score and  Plan: 2 and Ondansetron  Airway Management Planned: Mask  Additional Equipment:   Intra-op Plan:   Post-operative Plan:   Informed Consent: I have reviewed the patients History and Physical, chart, labs and discussed the procedure including the risks, benefits and alternatives for the proposed anesthesia with the patient or authorized representative who has indicated his/her understanding and acceptance.     Dental Advisory Given  Plan Discussed with: CRNA and Anesthesiologist  Anesthesia Plan Comments:         Anesthesia Quick Evaluation

## 2021-04-13 NOTE — H&P (Signed)
Melinda [name] is an 50 y.o. female.   Chief Complaint: no specific. Mood stable HPI: chronic depression  Past Medical History:  Diagnosis Date  . Depression   . Diabetes mellitus without complication ([place])   . Diabetic peripheral neuropathy ([place]) 03/07/15  . Diabetic peripheral neuropathy ([place]) 03/07/15  . Diabetic peripheral neuropathy ([place]) 03/07/15  . GERD (gastroesophageal reflux disease)   . Hypercholesterolemia 03/07/15  . Hypertension   . Obesity 03/07/15  . Personality disorder ([place]) 03/07/15  . Sinus tachycardia 03/07/15   history of  . Suicidal thoughts 03/07/15    Past Surgical History:  Procedure Laterality Date  . COLONOSCOPY WITH PROPOFOL N/A 12/01/2018   Procedure: COLONOSCOPY WITH PROPOFOL;  Surgeon: Jonathon Bellows, MD;  Location: [facility] ENDOSCOPY;  Service: Gastroenterology;  Laterality: N/A;  . electroconvulsion therapy  03/07/15    Family History  Problem Relation Age of Onset  . Hypertension Father   . Diabetes Mother    Social History:  reports that she has never smoked. She has never used smokeless tobacco. She reports that she does not drink alcohol and does not use drugs.  Allergies:  Allergies  Allergen Reactions  . Prednisone     Increases blood sugar    (Not in a hospital admission)   No results found. However, due to the size of the patient record, not all encounters were searched. Please check Results Review for a complete set of results. No results found.  Review of Systems  Constitutional: Negative.   HENT: Negative.   Eyes: Negative.   Respiratory: Negative.   Cardiovascular: Negative.   Gastrointestinal: Negative.   Musculoskeletal: Negative.   Skin: Negative.   Neurological: Negative.   Psychiatric/Behavioral: Negative.     Blood pressure (!) 154/88, pulse 89, temperature 98.1 F (36.7 C), temperature source Oral, resp. rate 20, weight 104.9 kg, SpO2 98 %. Physical Exam Vitals and nursing note reviewed.  Constitutional:       Appearance: She is well-developed.  HENT:     Head: Normocephalic and atraumatic.  Eyes:     Conjunctiva/sclera: Conjunctivae normal.     Pupils: Pupils are equal, round, and reactive to light.  Cardiovascular:     Heart sounds: Normal heart sounds.  Pulmonary:     Effort: Pulmonary effort is normal.  Abdominal:     Palpations: Abdomen is soft.  Musculoskeletal:        General: Normal range of motion.     Cervical back: Normal range of motion.  Skin:    General: Skin is warm and dry.  Neurological:     General: No focal deficit present.     Mental Status: She is alert.  Psychiatric:        Mood and Affect: Mood normal.      Assessment/Plan Continue maintenance  [name], MD 04/13/2021, 12:24 PM

## 2021-04-14 NOTE — Procedures (Signed)
ECT SERVICES Physician's Interval Evaluation & Treatment Note  Patient Identification: Melinda [name] MRN:  [id number] Date of Evaluation:  04/14/2021 TX #: [id number]  MADRS:   MMSE:   P.E. Findings:  No change to physical exam  Psychiatric Interval Note:  Mood stable no worse than usual  Subjective:  Patient is a 50 y.o. female seen for evaluation for Electroconvulsive Therapy. Chronic dysphoria but no suicidal thought  Treatment Summary:   []   Right Unilateral             [x]  Bilateral   % Energy : 1.0 ms 35%   Impedance: 1170 ohms  Seizure Energy Index: 6969 V squared  Postictal Suppression Index: 82%  Seizure Concordance Index: 95%  Medications  Pre Shock: Robinul 0.4 mg labetalol 20 mg esmolol 20 mg Brevital 70 mg succinylcholine 100 mg  Post Shock:    Seizure Duration: 40 seconds EMG 93 seconds EEG   Comments: Follow-up 3 weeks  Lungs:  [x]   Clear to auscultation               []  Other:   Heart:    [x]   Regular rhythm             []  irregular rhythm    [x]   Previous H&P reviewed, patient examined and there are NO CHANGES                 []   Previous H&P reviewed, patient examined and there are changes noted.   [name], MD 6/4/20221:44 PM

## 2021-04-23 ENCOUNTER — Other Ambulatory Visit: Payer: Self-pay | Admitting: Psychiatry

## 2021-04-25 ENCOUNTER — Other Ambulatory Visit: Payer: Self-pay | Admitting: Psychiatry

## 2021-04-30 ENCOUNTER — Other Ambulatory Visit: Payer: Self-pay | Admitting: Psychiatry

## 2021-04-30 MED ORDER — BUPROPION HCL ER (XL) 300 MG PO TB24: 1.0000 | ORAL_TABLET | Freq: Every day | ORAL | 1 refills | Status: DC

## 2021-04-30 MED ORDER — ESCITALOPRAM OXALATE 20 MG PO TABS: 20.0000 mg | ORAL_TABLET | Freq: Every day | ORAL | 1 refills | Status: DC

## 2021-05-03 ENCOUNTER — Other Ambulatory Visit: Payer: Self-pay | Admitting: Psychiatry

## 2021-05-04 ENCOUNTER — Encounter (HOSPITAL_BASED_OUTPATIENT_CLINIC_OR_DEPARTMENT_OTHER)
Admission: RE | Admit: 2021-05-04 | Discharge: 2021-05-04 | Disposition: A | Discharge status: Home / Self Care | Payer: Medicare HMO | Source: Ambulatory Visit | Attending: Psychiatry | Admitting: Psychiatry

## 2021-05-04 ENCOUNTER — Other Ambulatory Visit: Payer: Self-pay

## 2021-05-04 ENCOUNTER — Other Ambulatory Visit: Payer: Self-pay | Admitting: Psychiatry

## 2021-05-04 DIAGNOSIS — Z888 Allergy status to other drugs, medicaments and biological substances status: Secondary | ICD-10-CM | POA: Diagnosis not present

## 2021-05-04 DIAGNOSIS — F332 Major depressive disorder, recurrent severe without psychotic features: Secondary | ICD-10-CM | POA: Diagnosis not present

## 2021-05-04 DIAGNOSIS — Z79899 Other long term (current) drug therapy: Secondary | ICD-10-CM | POA: Diagnosis not present

## 2021-05-04 DIAGNOSIS — Z8249 Family history of ischemic heart disease and other diseases of the circulatory system: Secondary | ICD-10-CM | POA: Diagnosis not present

## 2021-05-04 DIAGNOSIS — Z791 Long term (current) use of non-steroidal anti-inflammatories (NSAID): Secondary | ICD-10-CM | POA: Diagnosis not present

## 2021-05-04 DIAGNOSIS — Z833 Family history of diabetes mellitus: Secondary | ICD-10-CM | POA: Diagnosis not present

## 2021-05-04 DIAGNOSIS — F339 Major depressive disorder, recurrent, unspecified: Secondary | ICD-10-CM | POA: Diagnosis not present

## 2021-05-04 LAB — GLUCOSE, CAPILLARY: Glucose-Capillary: 119 mg/dL — ABNORMAL HIGH (ref 70–99)

## 2021-05-04 MED ORDER — SODIUM CHLORIDE 0.9 % IV SOLN: INTRAVENOUS | Status: DC | PRN

## 2021-05-04 MED ORDER — FENTANYL CITRATE (PF) 100 MCG/2ML IJ SOLN: 25.0000 ug | INTRAMUSCULAR | Status: DC | PRN

## 2021-05-04 MED ORDER — SODIUM CHLORIDE 0.9 % IV SOLN
500.0000 mL | Freq: Once | INTRAVENOUS | Status: AC
  Administered 2021-05-04: 500 mL via INTRAVENOUS

## 2021-05-04 MED ORDER — KETOROLAC TROMETHAMINE 30 MG/ML IJ SOLN
INTRAMUSCULAR | Status: AC
  Administered 2021-05-04: 30 mg via INTRAVENOUS
  Filled 2021-05-04: qty 1

## 2021-05-04 MED ORDER — LABETALOL HCL 5 MG/ML IV SOLN
INTRAVENOUS | Status: DC | PRN
  Administered 2021-05-04: 30 mg via INTRAVENOUS

## 2021-05-04 MED ORDER — ONDANSETRON HCL 4 MG/2ML IJ SOLN: 4.0000 mg | Freq: Once | INTRAMUSCULAR | Status: DC | PRN

## 2021-05-04 MED ORDER — SUCCINYLCHOLINE CHLORIDE 20 MG/ML IJ SOLN
INTRAMUSCULAR | Status: DC | PRN
  Administered 2021-05-04: 100 mg via INTRAVENOUS

## 2021-05-04 MED ORDER — MEPERIDINE HCL 25 MG/ML IJ SOLN: 6.2500 mg | INTRAMUSCULAR | Status: DC | PRN

## 2021-05-04 MED ORDER — GLYCOPYRROLATE 0.2 MG/ML IJ SOLN
INTRAMUSCULAR | Status: AC
  Administered 2021-05-04: 0.4 mg via INTRAVENOUS
  Filled 2021-05-04: qty 2

## 2021-05-04 MED ORDER — METHOHEXITAL SODIUM 100 MG/10ML IV SOSY
PREFILLED_SYRINGE | INTRAVENOUS | Status: DC | PRN
  Administered 2021-05-04: 70 mg via INTRAVENOUS

## 2021-05-04 MED ORDER — KETOROLAC TROMETHAMINE 30 MG/ML IJ SOLN: 30.0000 mg | Freq: Once | INTRAMUSCULAR | Status: AC

## 2021-05-04 MED ORDER — ESMOLOL HCL 100 MG/10ML IV SOLN
INTRAVENOUS | Status: DC | PRN
  Administered 2021-05-04: 20 mg via INTRAVENOUS

## 2021-05-04 MED ORDER — GLYCOPYRROLATE 0.2 MG/ML IJ SOLN: 0.4000 mg | Freq: Once | INTRAMUSCULAR | Status: AC

## 2021-05-04 MED ORDER — ZIPRASIDONE HCL 80 MG PO CAPS: ORAL_CAPSULE | ORAL | 1 refills | Status: DC

## 2021-05-04 NOTE — Anesthesia Postprocedure Evaluation (Signed)
Anesthesia Post Note  Patient: Abrianna M Whitson  Procedure(s) Performed: ECT TX  Patient location during evaluation: PACU Anesthesia Type: General Level of consciousness: awake and alert, awake and oriented Pain management: pain level controlled Vital Signs Assessment: post-procedure vital signs reviewed and stable Respiratory status: spontaneous breathing, nonlabored ventilation and respiratory function stable Cardiovascular status: blood pressure returned to baseline and stable Postop Assessment: no apparent nausea or vomiting Anesthetic complications: no   No notable events documented.   Last Vitals:  Vitals:   05/04/21 1251 05/04/21 1301  BP: 137/75 (!) 139/92  Pulse: 99 92  Resp: 16 19  Temp:  36.4 C  SpO2: 96% 96%    Last Pain:  Vitals:   05/04/21 1301  PainSc: 0-No pain                 Phill Mutter

## 2021-05-04 NOTE — Transfer of Care (Signed)
Immediate Anesthesia Transfer of Care Note  Patient: Melinda [name]  Procedure(s) Performed: ECT TX  Patient Location: PACU  Anesthesia Type:General  Level of Consciousness: drowsy  Airway & Oxygen Therapy: Patient Spontanous Breathing and Patient connected to face mask oxygen  Post-op Assessment: Report given to RN and Post -op Vital signs reviewed and stable  Post vital signs: Reviewed and stable  Last Vitals:  Vitals Value Taken Time  BP 157/98   Temp    Pulse 98   Resp 22   SpO2 98     Last Pain:  Vitals:   05/04/21 1035  PainSc: 0-No pain         Complications: No notable events documented.

## 2021-05-04 NOTE — Anesthesia Preprocedure Evaluation (Signed)
Anesthesia Evaluation  Patient identified by MRN, date of birth, ID band Patient awake    Reviewed: Allergy & Precautions, H&P , NPO status , Patient's Chart, lab work & pertinent test results  History of Anesthesia Complications Negative for: history of anesthetic complications  Airway Mallampati: II  TM Distance: >3 FB Neck ROM: full    Dental  (+) Poor Dentition, Chipped   Pulmonary sleep apnea , neg COPD,    Pulmonary exam normal breath sounds clear to auscultation       Cardiovascular hypertension, Pt. on medications (-) CAD and (-) Past MI negative cardio ROS Normal cardiovascular exam Rhythm:regular Rate:Normal     Neuro/Psych PSYCHIATRIC DISORDERS Depression Bipolar Disorder  Neuromuscular disease negative neurological ROS     GI/Hepatic negative GI ROS, Neg liver ROS, GERD  Medicated,  Endo/Other  diabetes, Type 2, Oral Hypoglycemic Agents  Renal/GU negative Renal ROS  negative genitourinary   Musculoskeletal   Abdominal (+) + obese,   Peds  Hematology negative hematology ROS (+)   Anesthesia Other Findings Past Medical History: No date: Depression No date: Diabetes mellitus without complication (HCC) 2/56/38: Diabetic peripheral neuropathy ([place]) 03/07/15: Diabetic peripheral neuropathy ([place]) 03/07/15: Diabetic peripheral neuropathy (HCC) No date: GERD (gastroesophageal reflux disease) 03/07/15: Hypercholesterolemia No date: Hypertension 03/07/15: Obesity 03/07/15: Personality disorder 03/07/15: Sinus tachycardia ([place])     Comment: history of 03/07/15: Suicidal thoughts Past Surgical History: 03/07/15: electroconvulsion therapy BMI    Body Mass Index:  36.44 kg/m     Reproductive/Obstetrics                             Anesthesia Physical  Anesthesia Plan  ASA: III  Anesthesia Plan: General   Post-op Pain Management:    Induction: Intravenous  PONV Risk Score and  Plan: 2 and Ondansetron  Airway Management Planned: Mask  Additional Equipment:   Intra-op Plan:   Post-operative Plan:   Informed Consent: I have reviewed the patients History and Physical, chart, labs and discussed the procedure including the risks, benefits and alternatives for the proposed anesthesia with the patient or authorized representative who has indicated his/her understanding and acceptance.     Dental Advisory Given  Plan Discussed with: CRNA and Anesthesiologist  Anesthesia Plan Comments:         Anesthesia Quick Evaluation

## 2021-05-04 NOTE — Anesthesia Procedure Notes (Signed)
Procedure Name: General with mask airway Date/Time: 05/04/2021 12:26 PM Performed by: Lily Peer, Caera Enwright, CRNA Pre-anesthesia Checklist: Patient identified, Emergency Drugs available, Suction available, Patient being monitored and Timeout performed Patient Re-evaluated:Patient Re-evaluated prior to induction Oxygen Delivery Method: Circle system utilized Preoxygenation: Pre-oxygenation with 100% oxygen Induction Type: IV induction Ventilation: Mask ventilation throughout procedure

## 2021-05-04 NOTE — H&P (Signed)
Melinda [name] is an 50 y.o. female.   Chief Complaint: No acute complaint.  Chronic dysphoria good improvement with ECT HPI: Recurrent depression managed in part by maintenance ECT  Past Medical History:  Diagnosis Date   Depression    Diabetes mellitus without complication ([place])    Diabetic peripheral neuropathy ([place]) 03/07/15   Diabetic peripheral neuropathy ([place]) 03/07/15   Diabetic peripheral neuropathy ([place]) 03/07/15   GERD (gastroesophageal reflux disease)    Hypercholesterolemia 03/07/15   Hypertension    Obesity 03/07/15   Personality disorder ([place]) 03/07/15   Sinus tachycardia 03/07/15   history of   Suicidal thoughts 03/07/15    Past Surgical History:  Procedure Laterality Date   COLONOSCOPY WITH PROPOFOL N/A 12/01/2018   Procedure: COLONOSCOPY WITH PROPOFOL;  Surgeon: Jonathon Bellows, MD;  Location: [facility] ENDOSCOPY;  Service: Gastroenterology;  Laterality: N/A;   electroconvulsion therapy  03/07/15    Family History  Problem Relation Age of Onset   Hypertension Father    Diabetes Mother    Social History:  reports that she has never smoked. She has never used smokeless tobacco. She reports that she does not drink alcohol and does not use drugs.  Allergies:  Allergies  Allergen Reactions   Prednisone     Increases blood sugar    (Not in a hospital admission)   Results for orders placed or performed during the hospital encounter of 05/04/21 (from the past 48 hour(s))  Glucose, capillary     Status: Abnormal   Collection Time: 05/04/21 10:52 AM  Result Value Ref Range   Glucose-Capillary 119 (H) 70 - 99 mg/dL    Comment: Glucose reference range applies only to samples taken after fasting for at least 8 hours.   No results found.  Review of Systems  Constitutional: Negative.   HENT: Negative.    Eyes: Negative.   Respiratory: Negative.    Cardiovascular: Negative.   Gastrointestinal: Negative.   Musculoskeletal: Negative.   Skin: Negative.   Neurological:  Negative.   Psychiatric/Behavioral: Negative.     Blood pressure (!) 139/92, pulse 92, temperature 97.6 F (36.4 C), resp. rate 19, height 5\' 4"  (1.626 m), weight 104.9 kg, SpO2 96 %. Physical Exam Vitals and nursing note reviewed.  Constitutional:      Appearance: She is well-developed.  HENT:     Head: Normocephalic and atraumatic.  Eyes:     Conjunctiva/sclera: Conjunctivae normal.     Pupils: Pupils are equal, round, and reactive to light.  Cardiovascular:     Heart sounds: Normal heart sounds.  Pulmonary:     Effort: Pulmonary effort is normal.  Abdominal:     Palpations: Abdomen is soft.  Musculoskeletal:        General: Normal range of motion.     Cervical back: Normal range of motion.  Skin:    General: Skin is warm and dry.  Neurological:     General: No focal deficit present.     Mental Status: She is alert.  Psychiatric:        Mood and Affect: Mood normal.        Behavior: Behavior normal.        Thought Content: Thought content normal.     Assessment/Plan Continue current 3-week maintenance schedule  [name], MD 05/04/2021, 3:54 PM

## 2021-05-04 NOTE — Procedures (Signed)
ECT SERVICES Physician's Interval Evaluation & Treatment Note  Patient Identification: Melinda [name] MRN:  [id number] Date of Evaluation:  05/04/2021 TX #: [id number]  MADRS:   MMSE:   P.E. Findings:  No change to physical exam  Psychiatric Interval Note:  Affect and mood stable  Subjective:  Patient is a 50 y.o. female seen for evaluation for Electroconvulsive Therapy. No new complaints  Treatment Summary:   []   Right Unilateral             [x]  Bilateral   % Energy : 1.0 ms 35%   Impedance: 1340 ohms  Seizure Energy Index: 5278 V squared  Postictal Suppression Index: 67%  Seizure Concordance Index: 96%  Medications  Pre Shock: Robinul 0.4 mg Toradol 30 mg labetalol 20 mg esmolol 20 mg Brevital 70 mg succinylcholine 100 mg  Post Shock:    Seizure Duration: 36 seconds EmG 94 seconds EEG   Comments: Follow-up 3 weeks  Lungs:  [x]   Clear to auscultation               []  Other:   Heart:    [x]   Regular rhythm             []  irregular rhythm    [x]   Previous H&P reviewed, patient examined and there are NO CHANGES                 []   Previous H&P reviewed, patient examined and there are changes noted.   [name], MD 6/24/20224:07 PM

## 2021-05-07 DIAGNOSIS — E119 Type 2 diabetes mellitus without complications: Secondary | ICD-10-CM | POA: Diagnosis not present

## 2021-05-07 DIAGNOSIS — H5213 Myopia, bilateral: Secondary | ICD-10-CM | POA: Diagnosis not present

## 2021-05-07 DIAGNOSIS — H524 Presbyopia: Secondary | ICD-10-CM | POA: Diagnosis not present

## 2021-05-07 LAB — HM DIABETES EYE EXAM

## 2021-05-16 ENCOUNTER — Other Ambulatory Visit: Payer: Self-pay | Admitting: Family Medicine

## 2021-05-16 DIAGNOSIS — E785 Hyperlipidemia, unspecified: Secondary | ICD-10-CM

## 2021-05-21 ENCOUNTER — Encounter: Payer: Self-pay | Admitting: Physician Assistant

## 2021-05-25 ENCOUNTER — Other Ambulatory Visit: Payer: Self-pay | Admitting: Psychiatry

## 2021-05-25 ENCOUNTER — Encounter
Admission: RE | Admit: 2021-05-25 | Discharge: 2021-05-25 | Disposition: A | Discharge status: Home / Self Care | Payer: Medicare HMO | Source: Ambulatory Visit | Attending: Psychiatry | Admitting: Psychiatry

## 2021-05-25 ENCOUNTER — Other Ambulatory Visit: Payer: Self-pay

## 2021-05-25 ENCOUNTER — Ambulatory Visit: Payer: Self-pay | Admitting: Anesthesiology

## 2021-05-25 ENCOUNTER — Encounter: Payer: Self-pay | Admitting: Anesthesiology

## 2021-05-25 DIAGNOSIS — F339 Major depressive disorder, recurrent, unspecified: Secondary | ICD-10-CM | POA: Diagnosis not present

## 2021-05-25 DIAGNOSIS — Z791 Long term (current) use of non-steroidal anti-inflammatories (NSAID): Secondary | ICD-10-CM | POA: Insufficient documentation

## 2021-05-25 DIAGNOSIS — K219 Gastro-esophageal reflux disease without esophagitis: Secondary | ICD-10-CM | POA: Diagnosis not present

## 2021-05-25 DIAGNOSIS — Z79899 Other long term (current) drug therapy: Secondary | ICD-10-CM | POA: Diagnosis not present

## 2021-05-25 DIAGNOSIS — Z8249 Family history of ischemic heart disease and other diseases of the circulatory system: Secondary | ICD-10-CM | POA: Diagnosis not present

## 2021-05-25 DIAGNOSIS — F332 Major depressive disorder, recurrent severe without psychotic features: Secondary | ICD-10-CM | POA: Diagnosis not present

## 2021-05-25 DIAGNOSIS — Z833 Family history of diabetes mellitus: Secondary | ICD-10-CM | POA: Diagnosis not present

## 2021-05-25 DIAGNOSIS — Z888 Allergy status to other drugs, medicaments and biological substances status: Secondary | ICD-10-CM | POA: Diagnosis not present

## 2021-05-25 LAB — GLUCOSE, CAPILLARY: Glucose-Capillary: 122 mg/dL — ABNORMAL HIGH (ref 70–99)

## 2021-05-25 MED ORDER — LABETALOL HCL 5 MG/ML IV SOLN
INTRAVENOUS | Status: DC | PRN
  Administered 2021-05-25: 30 mg via INTRAVENOUS

## 2021-05-25 MED ORDER — SODIUM CHLORIDE 0.9 % IV SOLN: 500.0000 mL | Freq: Once | INTRAVENOUS | Status: DC

## 2021-05-25 MED ORDER — METHOHEXITAL SODIUM 0.5 G IJ SOLR
INTRAMUSCULAR | Status: AC
  Filled 2021-05-25: qty 500

## 2021-05-25 MED ORDER — ESMOLOL HCL 100 MG/10ML IV SOLN
INTRAVENOUS | Status: DC | PRN
  Administered 2021-05-25: 20 mg via INTRAVENOUS

## 2021-05-25 MED ORDER — GLYCOPYRROLATE 0.2 MG/ML IJ SOLN
INTRAMUSCULAR | Status: AC
  Administered 2021-05-25: 0.4 mg via INTRAVENOUS
  Filled 2021-05-25: qty 2

## 2021-05-25 MED ORDER — SUCCINYLCHOLINE CHLORIDE 20 MG/ML IJ SOLN
INTRAMUSCULAR | Status: DC | PRN
  Administered 2021-05-25: 100 mg via INTRAVENOUS

## 2021-05-25 MED ORDER — SODIUM CHLORIDE 0.9 % IV SOLN: INTRAVENOUS | Status: DC | PRN

## 2021-05-25 MED ORDER — METHOHEXITAL SODIUM 100 MG/10ML IV SOSY
PREFILLED_SYRINGE | INTRAVENOUS | Status: DC | PRN
  Administered 2021-05-25 (×2): 70 mg via INTRAVENOUS

## 2021-05-25 MED ORDER — KETOROLAC TROMETHAMINE 30 MG/ML IJ SOLN: 30.0000 mg | Freq: Once | INTRAMUSCULAR | Status: AC

## 2021-05-25 MED ORDER — KETOROLAC TROMETHAMINE 30 MG/ML IJ SOLN
INTRAMUSCULAR | Status: AC
  Administered 2021-05-25: 30 mg via INTRAVENOUS
  Filled 2021-05-25: qty 1

## 2021-05-25 MED ORDER — GLYCOPYRROLATE 0.2 MG/ML IJ SOLN: 0.4000 mg | Freq: Once | INTRAMUSCULAR | Status: AC

## 2021-05-25 NOTE — Procedures (Signed)
ECT SERVICES Physician's Interval Evaluation & Treatment Note  Patient Identification: Melinda [name] MRN:  [id number] Date of Evaluation:  05/25/2021 TX #: [id number]  MADRS:   MMSE:   P.E. Findings:  No change to physical exam  Psychiatric Interval Note:  Mood stable no change  Subjective:  Patient is a 50 y.o. female seen for evaluation for Electroconvulsive Therapy. No specific complaints  Treatment Summary:   []   Right Unilateral             [x]  Bilateral   % Energy : 1.0 ms 35%   Impedance: 1920 ohms  Seizure Energy Index: 2188 V squared  Postictal Suppression Index: 59%  Seizure Concordance Index: 89%  Medications  Pre Shock: Robinul 0.4 mg Toradol 30 mg esmolol 20 mg Brevital 70 mg succinylcholine 100 mg  Post Shock:    Seizure Duration: 31 seconds EMG 82 seconds EEG   Comments: Follow-up in 3 weeks  Lungs:  [x]   Clear to auscultation               []  Other:   Heart:    [x]   Regular rhythm             []  irregular rhythm    [x]   Previous H&P reviewed, patient examined and there are NO CHANGES                 []   Previous H&P reviewed, patient examined and there are changes noted.   [name], MD 7/15/20227:22 PM

## 2021-05-25 NOTE — Transfer of Care (Signed)
Immediate Anesthesia Transfer of Care Note  Patient: Melinda [name]  Procedure(s) Performed: ECT TX  Patient Location: PACU  Anesthesia Type:General  Level of Consciousness: drowsy  Airway & Oxygen Therapy: Patient Spontanous Breathing  Post-op Assessment: Report given to RN  Post vital signs: stable  Last Vitals:  Vitals Value Taken Time  BP    Temp    Pulse    Resp    SpO2      Last Pain:  Vitals:   05/25/21 1443  TempSrc:   PainSc: 0-No pain         Complications: No notable events documented.

## 2021-05-25 NOTE — H&P (Signed)
Melinda [name] is an 50 y.o. female.   Chief Complaint: No specific new complaint HPI: Recurrent severe depression  Past Medical History:  Diagnosis Date   Depression    Diabetes mellitus without complication ([place])    Diabetic peripheral neuropathy ([place]) 03/07/15   Diabetic peripheral neuropathy ([place]) 03/07/15   Diabetic peripheral neuropathy ([place]) 03/07/15   GERD (gastroesophageal reflux disease)    Hypercholesterolemia 03/07/15   Hypertension    Obesity 03/07/15   Personality disorder ([place]) 03/07/15   Sinus tachycardia 03/07/15   history of   Suicidal thoughts 03/07/15    Past Surgical History:  Procedure Laterality Date   COLONOSCOPY WITH PROPOFOL N/A 12/01/2018   Procedure: COLONOSCOPY WITH PROPOFOL;  Surgeon: Jonathon Bellows, MD;  Location: [facility] ENDOSCOPY;  Service: Gastroenterology;  Laterality: N/A;   electroconvulsion therapy  03/07/15    Family History  Problem Relation Age of Onset   Hypertension Father    Diabetes Mother    Social History:  reports that she has never smoked. She has never used smokeless tobacco. She reports that she does not drink alcohol and does not use drugs.  Allergies:  Allergies  Allergen Reactions   Prednisone     Increases blood sugar    (Not in a hospital admission)   Results for orders placed or performed during the hospital encounter of 05/25/21 (from the past 48 hour(s))  Glucose, capillary     Status: Abnormal   Collection Time: 05/25/21 12:01 PM  Result Value Ref Range   Glucose-Capillary 122 (H) 70 - 99 mg/dL    Comment: Glucose reference range applies only to samples taken after fasting for at least 8 hours.   *Note: Due to a large number of results and/or encounters for the requested time period, some results have not been displayed. A complete set of results can be found in Results Review.   No results found.  Review of Systems  Constitutional: Negative.   HENT: Negative.    Eyes: Negative.   Respiratory: Negative.     Cardiovascular: Negative.   Gastrointestinal: Negative.   Musculoskeletal: Negative.   Skin: Negative.   Neurological: Negative.   Psychiatric/Behavioral: Negative.     Blood pressure 132/80, pulse 85, temperature 98.2 F (36.8 C), temperature source Oral, resp. rate 16, height 5\' 4"  (1.626 m), weight 104.9 kg, SpO2 97 %. Physical Exam Vitals and nursing note reviewed.  Constitutional:      Appearance: She is well-developed.  HENT:     Head: Normocephalic and atraumatic.  Eyes:     Conjunctiva/sclera: Conjunctivae normal.     Pupils: Pupils are equal, round, and reactive to light.  Cardiovascular:     Heart sounds: Normal heart sounds.  Pulmonary:     Effort: Pulmonary effort is normal.  Abdominal:     Palpations: Abdomen is soft.  Musculoskeletal:        General: Normal range of motion.     Cervical back: Normal range of motion.  Skin:    General: Skin is warm and dry.  Neurological:     General: No focal deficit present.     Mental Status: She is alert.  Psychiatric:        Mood and Affect: Mood normal.     Assessment/Plan ECT continues to be of some benefit.  Treatment today follow-up 3 weeks for maintenance  [name], MD 05/25/2021, 7:20 PM

## 2021-05-25 NOTE — Anesthesia Preprocedure Evaluation (Signed)
Anesthesia Evaluation  Patient identified by MRN, date of birth, ID band Patient awake    Reviewed: Allergy & Precautions, H&P , NPO status , Patient's Chart, lab work & pertinent test results  History of Anesthesia Complications Negative for: history of anesthetic complications  Airway Mallampati: II  TM Distance: >3 FB Neck ROM: full    Dental  (+) Poor Dentition, Chipped   Pulmonary sleep apnea , neg COPD,    Pulmonary exam normal breath sounds clear to auscultation       Cardiovascular hypertension, Pt. on medications (-) CAD and (-) Past MI negative cardio ROS Normal cardiovascular exam Rhythm:regular Rate:Normal     Neuro/Psych PSYCHIATRIC DISORDERS Depression Bipolar Disorder  Neuromuscular disease negative neurological ROS     GI/Hepatic negative GI ROS, Neg liver ROS, GERD  Medicated,  Endo/Other  diabetes, Type 2, Oral Hypoglycemic Agents  Renal/GU negative Renal ROS  negative genitourinary   Musculoskeletal   Abdominal (+) + obese,   Peds  Hematology negative hematology ROS (+)   Anesthesia Other Findings Past Medical History: No date: Depression No date: Diabetes mellitus without complication (HCC) 5/99/35: Diabetic peripheral neuropathy ([place]) 03/07/15: Diabetic peripheral neuropathy ([place]) 03/07/15: Diabetic peripheral neuropathy (HCC) No date: GERD (gastroesophageal reflux disease) 03/07/15: Hypercholesterolemia No date: Hypertension 03/07/15: Obesity 03/07/15: Personality disorder 03/07/15: Sinus tachycardia ([place])     Comment: history of 03/07/15: Suicidal thoughts Past Surgical History: 03/07/15: electroconvulsion therapy BMI    Body Mass Index:  36.44 kg/m     Reproductive/Obstetrics                             Anesthesia Physical  Anesthesia Plan  ASA: III  Anesthesia Plan: General   Post-op Pain Management:    Induction: Intravenous  PONV Risk Score and  Plan: 2 and Ondansetron  Airway Management Planned: Mask  Additional Equipment:   Intra-op Plan:   Post-operative Plan:   Informed Consent: I have reviewed the patients History and Physical, chart, labs and discussed the procedure including the risks, benefits and alternatives for the proposed anesthesia with the patient or authorized representative who has indicated his/her understanding and acceptance.     Dental Advisory Given  Plan Discussed with: CRNA and Anesthesiologist  Anesthesia Plan Comments: (Patient consented for risks of anesthesia including but not limited to:  - adverse reactions to medications - risk of airway placement if required - damage to eyes, teeth, lips or other oral mucosa - nerve damage due to positioning  - sore throat or hoarseness - Damage to heart, brain, nerves, lungs, other parts of body or loss of life  Patient voiced understanding.)        Anesthesia Quick Evaluation

## 2021-05-25 NOTE — Anesthesia Postprocedure Evaluation (Signed)
Anesthesia Post Note  Patient: Melinda [name]  Procedure(s) Performed: ECT TX  Patient location during evaluation: PACU Anesthesia Type: General Level of consciousness: awake and alert Pain management: pain level controlled Vital Signs Assessment: post-procedure vital signs reviewed and stable Respiratory status: spontaneous breathing, nonlabored ventilation, respiratory function stable and patient connected to nasal cannula oxygen Cardiovascular status: blood pressure returned to baseline and stable Postop Assessment: no apparent nausea or vomiting Anesthetic complications: no   No notable events documented.   Last Vitals:  Vitals:   05/25/21 1515 05/25/21 1608  BP: 138/84 132/80  Pulse: 85 85  Resp: 14 16  Temp:  36.8 C  SpO2: 97%     Last Pain:  Vitals:   05/25/21 1608  TempSrc: Oral  PainSc: 0-No pain                 Precious Haws Azarias Chiou

## 2021-06-14 ENCOUNTER — Other Ambulatory Visit: Payer: Self-pay | Admitting: Psychiatry

## 2021-06-15 ENCOUNTER — Encounter: Payer: Self-pay | Admitting: Certified Registered"

## 2021-06-15 ENCOUNTER — Ambulatory Visit: Payer: Self-pay | Admitting: Certified Registered"

## 2021-06-15 ENCOUNTER — Other Ambulatory Visit: Payer: Self-pay

## 2021-06-15 ENCOUNTER — Encounter
Admission: RE | Admit: 2021-06-15 | Discharge: 2021-06-15 | Disposition: A | Discharge status: Home / Self Care | Payer: Medicare HMO | Source: Ambulatory Visit | Attending: Psychiatry | Admitting: Psychiatry

## 2021-06-15 DIAGNOSIS — F648 Other gender identity disorders: Secondary | ICD-10-CM | POA: Insufficient documentation

## 2021-06-15 DIAGNOSIS — F332 Major depressive disorder, recurrent severe without psychotic features: Secondary | ICD-10-CM | POA: Diagnosis not present

## 2021-06-15 DIAGNOSIS — E118 Type 2 diabetes mellitus with unspecified complications: Secondary | ICD-10-CM | POA: Insufficient documentation

## 2021-06-15 DIAGNOSIS — E7800 Pure hypercholesterolemia, unspecified: Secondary | ICD-10-CM | POA: Diagnosis not present

## 2021-06-15 LAB — GLUCOSE, CAPILLARY: Glucose-Capillary: 116 mg/dL — ABNORMAL HIGH (ref 70–99)

## 2021-06-15 MED ORDER — SUCCINYLCHOLINE CHLORIDE 200 MG/10ML IV SOSY
PREFILLED_SYRINGE | INTRAVENOUS | Status: DC | PRN
Start: 2021-06-15 — End: 2021-06-15
  Administered 2021-06-15: 100 mg via INTRAVENOUS

## 2021-06-15 MED ORDER — ONDANSETRON HCL 4 MG/2ML IJ SOLN: 4.0000 mg | Freq: Once | INTRAMUSCULAR | Status: DC | PRN

## 2021-06-15 MED ORDER — KETOROLAC TROMETHAMINE 30 MG/ML IJ SOLN
INTRAMUSCULAR | Status: AC
  Administered 2021-06-15: 30 mg via INTRAVENOUS
  Filled 2021-06-15: qty 1

## 2021-06-15 MED ORDER — GLYCOPYRROLATE 0.2 MG/ML IJ SOLN
INTRAMUSCULAR | Status: AC
  Administered 2021-06-15: 0.4 mg via INTRAVENOUS
  Filled 2021-06-15: qty 2

## 2021-06-15 MED ORDER — METHOHEXITAL SODIUM 100 MG/10ML IV SOSY
PREFILLED_SYRINGE | INTRAVENOUS | Status: DC | PRN
  Administered 2021-06-15: 70 mg via INTRAVENOUS

## 2021-06-15 MED ORDER — SODIUM CHLORIDE 0.9 % IV SOLN
500.0000 mL | Freq: Once | INTRAVENOUS | Status: AC
  Administered 2021-06-15: 500 mL via INTRAVENOUS

## 2021-06-15 MED ORDER — SODIUM CHLORIDE 0.9 % IV SOLN: INTRAVENOUS | Status: DC | PRN

## 2021-06-15 MED ORDER — GLYCOPYRROLATE 0.2 MG/ML IJ SOLN: 0.4000 mg | Freq: Once | INTRAMUSCULAR | Status: AC

## 2021-06-15 MED ORDER — LABETALOL HCL 5 MG/ML IV SOLN
INTRAVENOUS | Status: DC | PRN
  Administered 2021-06-15: 20 mg via INTRAVENOUS

## 2021-06-15 MED ORDER — ESMOLOL HCL 100 MG/10ML IV SOLN
INTRAVENOUS | Status: DC | PRN
  Administered 2021-06-15: 20 mg via INTRAVENOUS

## 2021-06-15 MED ORDER — KETOROLAC TROMETHAMINE 30 MG/ML IJ SOLN: 30.0000 mg | Freq: Once | INTRAMUSCULAR | Status: AC

## 2021-06-15 NOTE — Transfer of Care (Signed)
Immediate Anesthesia Transfer of Care Note  Patient: Melinda [name]  Procedure(s) Performed: ECT TX  Patient Location: PACU  Anesthesia Type:General  Level of Consciousness: drowsy  Airway & Oxygen Therapy: Patient Spontanous Breathing  Post-op Assessment: Report given to RN  Post vital signs: stable  Last Vitals:  Vitals Value Taken Time  BP 135/123 06/15/21 1315  Temp    Pulse 103 06/15/21 1317  Resp 33 06/15/21 1317  SpO2 100 % 06/15/21 1317  Vitals shown include unvalidated device data.  Last Pain:  Vitals:   06/15/21 1145  TempSrc:   PainSc: 0-No pain         Complications: No notable events documented.

## 2021-06-15 NOTE — H&P (Signed)
Melinda [name] is an 50 y.o. female.   Chief Complaint: Patient has no specific complaint.  Chronic dysphoria HPI: History of recurrent depression reasonable response to ECT  Past Medical History:  Diagnosis Date   Depression    Diabetes mellitus without complication ([place])    Diabetic peripheral neuropathy ([place]) 03/07/15   Diabetic peripheral neuropathy ([place]) 03/07/15   Diabetic peripheral neuropathy ([place]) 03/07/15   GERD (gastroesophageal reflux disease)    Hypercholesterolemia 03/07/15   Hypertension    Obesity 03/07/15   Personality disorder ([place]) 03/07/15   Sinus tachycardia 03/07/15   history of   Suicidal thoughts 03/07/15    Past Surgical History:  Procedure Laterality Date   COLONOSCOPY WITH PROPOFOL N/A 12/01/2018   Procedure: COLONOSCOPY WITH PROPOFOL;  Surgeon: Jonathon Bellows, MD;  Location: [facility] ENDOSCOPY;  Service: Gastroenterology;  Laterality: N/A;   electroconvulsion therapy  03/07/15    Family History  Problem Relation Age of Onset   Hypertension Father    Diabetes Mother    Social History:  reports that she has never smoked. She has never used smokeless tobacco. She reports that she does not drink alcohol and does not use drugs.  Allergies:  Allergies  Allergen Reactions   Prednisone     Increases blood sugar    (Not in a hospital admission)   Results for orders placed or performed during the hospital encounter of 06/15/21 (from the past 48 hour(s))  Glucose, capillary     Status: Abnormal   Collection Time: 06/15/21 11:42 AM  Result Value Ref Range   Glucose-Capillary 116 (H) 70 - 99 mg/dL    Comment: Glucose reference range applies only to samples taken after fasting for at least 8 hours.   *Note: Due to a large number of results and/or encounters for the requested time period, some results have not been displayed. A complete set of results can be found in Results Review.   No results found.  Review of Systems  Constitutional: Negative.   HENT:  Negative.    Eyes: Negative.   Respiratory: Negative.    Cardiovascular: Negative.   Gastrointestinal: Negative.   Musculoskeletal: Negative.   Skin: Negative.   Neurological: Negative.   Psychiatric/Behavioral: Negative.    All other systems reviewed and are negative.  Blood pressure 120/85, pulse 95, temperature 98.5 F (36.9 C), temperature source Oral, resp. rate 18, height '5\' 4"'$  (1.626 m), weight 104.9 kg, SpO2 96 %. Physical Exam Vitals and nursing note reviewed.  Constitutional:      Appearance: She is well-developed.  HENT:     Head: Normocephalic and atraumatic.  Eyes:     Conjunctiva/sclera: Conjunctivae normal.     Pupils: Pupils are equal, round, and reactive to light.  Cardiovascular:     Heart sounds: Normal heart sounds.  Pulmonary:     Effort: Pulmonary effort is normal.  Abdominal:     Palpations: Abdomen is soft.  Musculoskeletal:        General: Normal range of motion.     Cervical back: Normal range of motion.  Skin:    General: Skin is warm and dry.  Neurological:     Mental Status: She is alert.  Psychiatric:        Mood and Affect: Mood normal.        Thought Content: Thought content normal.     Assessment/Plan Treatment today and follow up in 3 weeks.  [name], MD 06/15/2021, 3:57 PM

## 2021-06-15 NOTE — Anesthesia Postprocedure Evaluation (Signed)
Anesthesia Post Note  Patient: Melinda [name]  Procedure(s) Performed: ECT TX  Patient location during evaluation: PACU Anesthesia Type: General Level of consciousness: awake and alert Pain management: pain level controlled Vital Signs Assessment: post-procedure vital signs reviewed and stable Respiratory status: spontaneous breathing, nonlabored ventilation, respiratory function stable and patient connected to nasal cannula oxygen Cardiovascular status: blood pressure returned to baseline and stable Postop Assessment: no apparent nausea or vomiting Anesthetic complications: no   No notable events documented.   Last Vitals:  Vitals:   06/15/21 1333 06/15/21 1342  BP: 121/88 120/85  Pulse: 98 95  Resp: (!) 22 18  Temp: 36.9 C 36.9 C  SpO2: 96%     Last Pain:  Vitals:   06/15/21 1342  TempSrc: Oral  PainSc: 0-No pain                 Arita Miss

## 2021-06-15 NOTE — Anesthesia Preprocedure Evaluation (Signed)
Anesthesia Evaluation  Patient identified by MRN, date of birth, ID band Patient awake    Reviewed: Allergy & Precautions, H&P , NPO status , Patient's Chart, lab work & pertinent test results  History of Anesthesia Complications Negative for: history of anesthetic complications  Airway Mallampati: II  TM Distance: >3 FB Neck ROM: full    Dental  (+) Poor Dentition, Chipped   Pulmonary sleep apnea , neg COPD,    Pulmonary exam normal breath sounds clear to auscultation       Cardiovascular hypertension, Pt. on medications (-) CAD and (-) Past MI Normal cardiovascular exam Rhythm:regular Rate:Normal     Neuro/Psych PSYCHIATRIC DISORDERS Depression Bipolar Disorder  Neuromuscular disease    GI/Hepatic Neg liver ROS, GERD  Medicated,  Endo/Other  diabetes, Type 2, Oral Hypoglycemic Agents  Renal/GU negative Renal ROS  negative genitourinary   Musculoskeletal   Abdominal (+) + obese,   Peds  Hematology negative hematology ROS (+)   Anesthesia Other Findings Past Medical History: No date: Depression No date: Diabetes mellitus without complication (HCC) 99991111: Diabetic peripheral neuropathy ([place]) 03/07/15: Diabetic peripheral neuropathy ([place]) 03/07/15: Diabetic peripheral neuropathy (HCC) No date: GERD (gastroesophageal reflux disease) 03/07/15: Hypercholesterolemia No date: Hypertension 03/07/15: Obesity 03/07/15: Personality disorder 03/07/15: Sinus tachycardia ([place])     Comment: history of 03/07/15: Suicidal thoughts Past Surgical History: 03/07/15: electroconvulsion therapy BMI    Body Mass Index:  36.44 kg/m     Reproductive/Obstetrics                             Anesthesia Physical  Anesthesia Plan  ASA: 3  Anesthesia Plan: General   Post-op Pain Management:    Induction: Intravenous  PONV Risk Score and Plan: 3 and Ondansetron and TIVA  Airway Management Planned:  Mask  Additional Equipment:   Intra-op Plan:   Post-operative Plan:   Informed Consent: I have reviewed the patients History and Physical, chart, labs and discussed the procedure including the risks, benefits and alternatives for the proposed anesthesia with the patient or authorized representative who has indicated his/her understanding and acceptance.     Dental Advisory Given  Plan Discussed with: CRNA and Anesthesiologist  Anesthesia Plan Comments: (Patient consented for risks of anesthesia including but not limited to:  - adverse reactions to medications - risk of airway placement if required - damage to eyes, teeth, lips or other oral mucosa - nerve damage due to positioning  - sore throat or hoarseness - Damage to heart, brain, nerves, lungs, other parts of body or loss of life  Patient voiced understanding.)        Anesthesia Quick Evaluation

## 2021-06-15 NOTE — Procedures (Signed)
ECT SERVICES Physician's Interval Evaluation & Treatment Note  Patient Identification: Melinda [name] MRN:  [id number] Date of Evaluation:  06/15/2021 TX #: [id number]  MADRS:   MMSE:   P.E. Findings:  No change to physical exam.  Appears healthy.  Psychiatric Interval Note:  Mood stable not worse.  Subjective:  Patient is a 51 y.o. female seen for evaluation for Electroconvulsive Therapy. No specific complaint  Treatment Summary:   '[]'$   Right Unilateral             '[x]'$  Bilateral   % Energy : 1.0 ms 35%   Impedance: 1300 ohms  Seizure Energy Index: 8749 V squared  Postictal Suppression Index: 63%  Seizure Concordance Index: 97%  Medications  Pre Shock: 0.4 mg Robinul, 30 mg Toradol, 70 mg Brevital, 100 mg succinylcholine, 20 mg esmolol, 20 mg labetalol  Post Shock:    Seizure Duration: 32 seconds EMG 73 seconds EEG   Comments: Follow-up 3 weeks  Lungs:  '[x]'$   Clear to auscultation               '[]'$  Other:   Heart:    '[x]'$   Regular rhythm             '[]'$  irregular rhythm    '[x]'$   Previous H&P reviewed, patient examined and there are NO CHANGES                 '[]'$   Previous H&P reviewed, patient examined and there are changes noted.   [name], MD 8/5/20223:59 PM

## 2021-07-06 ENCOUNTER — Encounter (HOSPITAL_BASED_OUTPATIENT_CLINIC_OR_DEPARTMENT_OTHER)
Admission: RE | Admit: 2021-07-06 | Discharge: 2021-07-06 | Disposition: A | Discharge status: Home / Self Care | Payer: Medicare HMO | Source: Ambulatory Visit | Attending: Psychiatry | Admitting: Psychiatry

## 2021-07-06 ENCOUNTER — Other Ambulatory Visit: Payer: Self-pay

## 2021-07-06 ENCOUNTER — Encounter: Payer: Self-pay | Admitting: Registered Nurse

## 2021-07-06 ENCOUNTER — Ambulatory Visit: Payer: Self-pay | Admitting: Registered Nurse

## 2021-07-06 DIAGNOSIS — F332 Major depressive disorder, recurrent severe without psychotic features: Secondary | ICD-10-CM

## 2021-07-06 DIAGNOSIS — K219 Gastro-esophageal reflux disease without esophagitis: Secondary | ICD-10-CM | POA: Diagnosis not present

## 2021-07-06 DIAGNOSIS — E118 Type 2 diabetes mellitus with unspecified complications: Secondary | ICD-10-CM | POA: Diagnosis not present

## 2021-07-06 DIAGNOSIS — F648 Other gender identity disorders: Secondary | ICD-10-CM | POA: Diagnosis not present

## 2021-07-06 LAB — GLUCOSE, CAPILLARY: Glucose-Capillary: 122 mg/dL — ABNORMAL HIGH (ref 70–99)

## 2021-07-06 MED ORDER — LABETALOL HCL 5 MG/ML IV SOLN
INTRAVENOUS | Status: DC | PRN
  Administered 2021-07-06 (×2): 10 mg via INTRAVENOUS

## 2021-07-06 MED ORDER — SUCCINYLCHOLINE CHLORIDE 200 MG/10ML IV SOSY
PREFILLED_SYRINGE | INTRAVENOUS | Status: AC
  Filled 2021-07-06: qty 10

## 2021-07-06 MED ORDER — ESMOLOL HCL 100 MG/10ML IV SOLN
INTRAVENOUS | Status: DC | PRN
  Administered 2021-07-06: 20 mg via INTRAVENOUS
  Administered 2021-07-06: 10 mg via INTRAVENOUS

## 2021-07-06 MED ORDER — GLYCOPYRROLATE 0.2 MG/ML IJ SOLN
INTRAMUSCULAR | Status: AC
  Filled 2021-07-06: qty 1

## 2021-07-06 MED ORDER — SODIUM CHLORIDE 0.9 % IV SOLN
INTRAVENOUS | Status: DC | PRN
Start: 2021-07-06 — End: 2021-07-06

## 2021-07-06 MED ORDER — GLYCOPYRROLATE 0.2 MG/ML IJ SOLN: 0.4000 mg | Freq: Once | INTRAMUSCULAR | Status: AC

## 2021-07-06 MED ORDER — METHOHEXITAL SODIUM 0.5 G IJ SOLR
INTRAMUSCULAR | Status: AC
  Filled 2021-07-06: qty 500

## 2021-07-06 MED ORDER — LABETALOL HCL 5 MG/ML IV SOLN
INTRAVENOUS | Status: AC
  Filled 2021-07-06: qty 4

## 2021-07-06 MED ORDER — METHOHEXITAL SODIUM 100 MG/10ML IV SOSY
PREFILLED_SYRINGE | INTRAVENOUS | Status: DC | PRN
  Administered 2021-07-06: 70 mg via INTRAVENOUS

## 2021-07-06 MED ORDER — GLYCOPYRROLATE 0.2 MG/ML IJ SOLN
INTRAMUSCULAR | Status: AC
  Administered 2021-07-06: 0.4 mg via INTRAVENOUS
  Filled 2021-07-06: qty 1

## 2021-07-06 MED ORDER — SODIUM CHLORIDE 0.9 % IV SOLN
500.0000 mL | Freq: Once | INTRAVENOUS | Status: AC
  Administered 2021-07-06: 500 mL via INTRAVENOUS

## 2021-07-06 MED ORDER — SUCCINYLCHOLINE CHLORIDE 200 MG/10ML IV SOSY
PREFILLED_SYRINGE | INTRAVENOUS | Status: DC | PRN
Start: 2021-07-06 — End: 2021-07-06
  Administered 2021-07-06: 100 mg via INTRAVENOUS

## 2021-07-06 MED ORDER — KETOROLAC TROMETHAMINE 30 MG/ML IJ SOLN: 30.0000 mg | Freq: Once | INTRAMUSCULAR | Status: AC

## 2021-07-06 MED ORDER — ESMOLOL HCL 100 MG/10ML IV SOLN
INTRAVENOUS | Status: AC
  Filled 2021-07-06: qty 10

## 2021-07-06 MED ORDER — SUCCINYLCHOLINE CHLORIDE 200 MG/10ML IV SOSY
PREFILLED_SYRINGE | INTRAVENOUS | Status: AC
  Filled 2021-07-06: qty 20

## 2021-07-06 MED ORDER — KETOROLAC TROMETHAMINE 30 MG/ML IJ SOLN
INTRAMUSCULAR | Status: AC
  Administered 2021-07-06: 30 mg via INTRAVENOUS
  Filled 2021-07-06: qty 1

## 2021-07-06 NOTE — H&P (Signed)
Melinda [name] is an 50 y.o. female.   Chief Complaint: No new complaints. HPI: History of recurrent severe depression with good control with maintenance ECT  Past Medical History:  Diagnosis Date   Depression    Diabetes mellitus without complication ([place])    Diabetic peripheral neuropathy ([place]) 03/07/15   Diabetic peripheral neuropathy ([place]) 03/07/15   Diabetic peripheral neuropathy ([place]) 03/07/15   GERD (gastroesophageal reflux disease)    Hypercholesterolemia 03/07/15   Hypertension    Obesity 03/07/15   Personality disorder ([place]) 03/07/15   Sinus tachycardia 03/07/15   history of   Suicidal thoughts 03/07/15    Past Surgical History:  Procedure Laterality Date   COLONOSCOPY WITH PROPOFOL N/A 12/01/2018   Procedure: COLONOSCOPY WITH PROPOFOL;  Surgeon: Jonathon Bellows, MD;  [facility] ENDOSCOPY;  Service: Gastroenterology;  Laterality: N/A;   electroconvulsion therapy  03/07/15    Family History  Problem Relation Age of Onset   Hypertension Father    Diabetes Mother    Social History:  reports that she has never smoked. She has never used smokeless tobacco. She reports that she does not drink alcohol and does not use drugs.  Allergies:  Allergies  Allergen Reactions   Prednisone     Increases blood sugar    (Not in a hospital admission)   Results for orders placed or performed during the hospital encounter of 07/06/21 (from the past 48 hour(s))  Glucose, capillary     Status: Abnormal   Collection Time: 07/06/21 11:20 AM  Result Value Ref Range   Glucose-Capillary 122 (H) 70 - 99 mg/dL    Comment: Glucose reference range applies only to samples taken after fasting for at least 8 hours.   No results found.  Review of Systems  Constitutional: Negative.   HENT: Negative.    Eyes: Negative.   Respiratory: Negative.    Cardiovascular: Negative.   Gastrointestinal: Negative.   Musculoskeletal: Negative.   Skin: Negative.   Neurological: Negative.    Psychiatric/Behavioral: Negative.     Blood pressure 120/65, pulse 95, temperature 98.4 F (36.9 C), temperature source Oral, resp. rate 18, weight 104.9 kg, SpO2 95 %. Physical Exam Vitals and nursing note reviewed.  Constitutional:      Appearance: She is well-developed.  HENT:     Head: Normocephalic and atraumatic.  Eyes:     Conjunctiva/sclera: Conjunctivae normal.     Pupils: Pupils are equal, round, and reactive to light.  Cardiovascular:     Heart sounds: Normal heart sounds.  Pulmonary:     Effort: Pulmonary effort is normal.  Abdominal:     Palpations: Abdomen is soft.  Musculoskeletal:        General: Normal range of motion.     Cervical back: Normal range of motion.  Skin:    General: Skin is warm and dry.  Neurological:     General: No focal deficit present.     Mental Status: She is alert.  Psychiatric:        Mood and Affect: Mood normal.     Assessment/Plan Treatment today follow-up 3 weeks as usual schedule.  Patient agreeable.  [name], MD 07/06/2021, 6:33 PM

## 2021-07-06 NOTE — Transfer of Care (Signed)
Immediate Anesthesia Transfer of Care Note  Patient: Melinda [name]  Procedure(s) Performed: ECT TX  Patient Location: PACU  Anesthesia Type:General  Level of Consciousness: drowsy  Airway & Oxygen Therapy: Patient Spontanous Breathing and Patient connected to face mask oxygen  Post-op Assessment: Report given to RN and Post -op Vital signs reviewed and stable  Post vital signs: Reviewed and stable  Last Vitals:  Vitals Value Taken Time  BP    Temp    Pulse 99 07/06/21 1328  Resp 11 07/06/21 1328  SpO2 100 % 07/06/21 1328  Vitals shown include unvalidated device data.  Last Pain:  Vitals:   07/06/21 1056  TempSrc:   PainSc: 0-No pain         Complications: No notable events documented.

## 2021-07-06 NOTE — Anesthesia Postprocedure Evaluation (Signed)
Anesthesia Post Note  Patient: Melinda [name]  Procedure(s) Performed: ECT TX  Patient location during evaluation: PACU Anesthesia Type: General Level of consciousness: awake and alert, awake and oriented Pain management: pain level controlled Vital Signs Assessment: post-procedure vital signs reviewed and stable Respiratory status: spontaneous breathing, nonlabored ventilation and respiratory function stable Cardiovascular status: blood pressure returned to baseline and stable Postop Assessment: no apparent nausea or vomiting Anesthetic complications: no   No notable events documented.   Last Vitals:  Vitals:   07/06/21 1350 07/06/21 1408  BP: 118/68 120/65  Pulse: 97 95  Resp: 17 18  Temp: 36.9 C 36.9 C  SpO2: 95%     Last Pain:  Vitals:   07/06/21 1408  TempSrc: Oral  PainSc: 0-No pain                 Phill Mutter

## 2021-07-06 NOTE — Procedures (Signed)
ECT SERVICES Physician's Interval Evaluation & Treatment Note  Patient Identification: WAIVE MOUND MRN:  [id number] Date of Evaluation:  07/06/2021 TX #: [id number]  MADRS:   MMSE:   P.E. Findings:  No change to physical exam  Psychiatric Interval Note:  Mood stable doing well  Subjective:  Patient is a 50 y.o. female seen for evaluation for Electroconvulsive Therapy. No complaints  Treatment Summary:   '[]'$   Right Unilateral             '[x]'$  Bilateral   % Energy : 1.0 ms 35%   Impedance: 1570 ohms  Seizure Energy Index: 8187 V squared  Postictal Suppression Index: 67%  Seizure Concordance Index: 97%  Medications  Pre Shock: Robinul 0.4 mg Toradol 30 mg esmolol 20 mg labetalol 20 mg Brevital 70 mg succinylcholine 100 mg  Post Shock:    Seizure Duration: 35 seconds EMG 99 seconds EEG   Comments: Follow-up 3 weeks  Lungs:  '[x]'$   Clear to auscultation               '[]'$  Other:   Heart:    '[x]'$   Regular rhythm             '[]'$  irregular rhythm    '[x]'$   Previous H&P reviewed, patient examined and there are NO CHANGES                 '[]'$   Previous H&P reviewed, patient examined and there are changes noted.   [name], MD 8/26/20226:43 PM

## 2021-07-06 NOTE — Anesthesia Preprocedure Evaluation (Signed)
Anesthesia Evaluation  Patient identified by MRN, date of birth, ID band Patient awake    Reviewed: Allergy & Precautions, H&P , NPO status , Patient's Chart, lab work & pertinent test results  History of Anesthesia Complications Negative for: history of anesthetic complications  Airway Mallampati: II  TM Distance: >3 FB Neck ROM: full    Dental  (+) Poor Dentition, Chipped   Pulmonary sleep apnea , neg COPD,    Pulmonary exam normal breath sounds clear to auscultation       Cardiovascular hypertension, Pt. on medications (-) CAD and (-) Past MI Normal cardiovascular exam Rhythm:regular Rate:Normal     Neuro/Psych PSYCHIATRIC DISORDERS Depression Bipolar Disorder  Neuromuscular disease    GI/Hepatic Neg liver ROS, GERD  Medicated,  Endo/Other  diabetes, Type 2, Oral Hypoglycemic Agents  Renal/GU negative Renal ROS  negative genitourinary   Musculoskeletal   Abdominal (+) + obese,   Peds  Hematology negative hematology ROS (+)   Anesthesia Other Findings Past Medical History: No date: Depression No date: Diabetes mellitus without complication (HCC) 99991111: Diabetic peripheral neuropathy ([place]) 03/07/15: Diabetic peripheral neuropathy ([place]) 03/07/15: Diabetic peripheral neuropathy (HCC) No date: GERD (gastroesophageal reflux disease) 03/07/15: Hypercholesterolemia No date: Hypertension 03/07/15: Obesity 03/07/15: Personality disorder 03/07/15: Sinus tachycardia ([place])     Comment: history of 03/07/15: Suicidal thoughts Past Surgical History: 03/07/15: electroconvulsion therapy BMI    Body Mass Index:  36.44 kg/m     Reproductive/Obstetrics                             Anesthesia Physical  Anesthesia Plan  ASA: 3  Anesthesia Plan: General   Post-op Pain Management:    Induction: Intravenous  PONV Risk Score and Plan: 3 and Ondansetron and TIVA  Airway Management Planned:  Mask  Additional Equipment:   Intra-op Plan:   Post-operative Plan:   Informed Consent: I have reviewed the patients History and Physical, chart, labs and discussed the procedure including the risks, benefits and alternatives for the proposed anesthesia with the patient or authorized representative who has indicated his/her understanding and acceptance.     Dental Advisory Given  Plan Discussed with: CRNA and Anesthesiologist  Anesthesia Plan Comments: (Patient consented for risks of anesthesia including but not limited to:  - adverse reactions to medications - risk of airway placement if required - damage to eyes, teeth, lips or other oral mucosa - nerve damage due to positioning  - sore throat or hoarseness - Damage to heart, brain, nerves, lungs, other parts of body or loss of life  Patient voiced understanding.)        Anesthesia Quick Evaluation

## 2021-07-20 ENCOUNTER — Telehealth: Payer: Self-pay | Admitting: Family Medicine

## 2021-07-20 ENCOUNTER — Other Ambulatory Visit: Payer: Self-pay

## 2021-07-20 DIAGNOSIS — E119 Type 2 diabetes mellitus without complications: Secondary | ICD-10-CM

## 2021-07-20 MED ORDER — TRUE METRIX BLOOD GLUCOSE TEST VI STRP: ORAL_STRIP | 3 refills | Status: DC

## 2021-07-20 NOTE — Telephone Encounter (Signed)
Paitent called states practice couldn't reach mail order pharmacy for refill. She says they can be contacted at  [phone]

## 2021-07-24 ENCOUNTER — Other Ambulatory Visit: Payer: Self-pay | Admitting: Family Medicine

## 2021-07-24 DIAGNOSIS — E119 Type 2 diabetes mellitus without complications: Secondary | ICD-10-CM

## 2021-07-26 ENCOUNTER — Telehealth: Payer: Self-pay | Admitting: Family Medicine

## 2021-07-26 NOTE — Telephone Encounter (Signed)
Copied from [place] [phone]. Topic: Quick Communication - Rx Refill/Question >> Jul 26, 2021  3:58 PM Leward Quan A wrote: Medication: True Metrix Lancets  Has the patient contacted their pharmacy? Yes.   (Agent: If no, request that the patient contact the pharmacy for the refill.) (Agent: If yes, when and what did the pharmacy advise?)  Preferred Pharmacy (with phone number or street name): [facility] - [place], [place]  Phone:  [phone] Fax:  [phone]     Agent: Please be advised that RX refills may take up to 3 business days. We ask that you follow-up with your pharmacy.

## 2021-07-27 MED ORDER — ACCU-CHEK SOFTCLIX LANCETS MISC: 2 refills | Status: DC

## 2021-07-31 ENCOUNTER — Other Ambulatory Visit: Payer: Self-pay | Admitting: Family Medicine

## 2021-08-03 ENCOUNTER — Encounter: Payer: Self-pay | Admitting: Certified Registered Nurse Anesthetist

## 2021-08-03 ENCOUNTER — Encounter
Admission: RE | Admit: 2021-08-03 | Discharge: 2021-08-03 | Disposition: A | Discharge status: Home / Self Care | Payer: Medicare HMO | Source: Ambulatory Visit | Attending: Psychiatry | Admitting: Psychiatry

## 2021-08-03 ENCOUNTER — Other Ambulatory Visit: Payer: Self-pay

## 2021-08-03 ENCOUNTER — Other Ambulatory Visit: Payer: Self-pay | Admitting: Psychiatry

## 2021-08-03 DIAGNOSIS — F332 Major depressive disorder, recurrent severe without psychotic features: Secondary | ICD-10-CM

## 2021-08-03 DIAGNOSIS — G4730 Sleep apnea, unspecified: Secondary | ICD-10-CM | POA: Diagnosis not present

## 2021-08-03 DIAGNOSIS — F32A Depression, unspecified: Secondary | ICD-10-CM | POA: Diagnosis not present

## 2021-08-03 DIAGNOSIS — Z888 Allergy status to other drugs, medicaments and biological substances status: Secondary | ICD-10-CM | POA: Insufficient documentation

## 2021-08-03 DIAGNOSIS — F419 Anxiety disorder, unspecified: Secondary | ICD-10-CM | POA: Insufficient documentation

## 2021-08-03 DIAGNOSIS — E1142 Type 2 diabetes mellitus with diabetic polyneuropathy: Secondary | ICD-10-CM | POA: Diagnosis not present

## 2021-08-03 LAB — GLUCOSE, CAPILLARY
Glucose-Capillary: 145 mg/dL — ABNORMAL HIGH (ref 70–99)
Glucose-Capillary: 162 mg/dL — ABNORMAL HIGH (ref 70–99)

## 2021-08-03 MED ORDER — GLYCOPYRROLATE 0.2 MG/ML IJ SOLN
INTRAMUSCULAR | Status: AC
  Administered 2021-08-03: 0.4 mg via INTRAVENOUS
  Filled 2021-08-03: qty 2

## 2021-08-03 MED ORDER — ESMOLOL HCL 100 MG/10ML IV SOLN
INTRAVENOUS | Status: DC | PRN
  Administered 2021-08-03: 30 mg via INTRAVENOUS

## 2021-08-03 MED ORDER — METHOHEXITAL SODIUM 100 MG/10ML IV SOSY
PREFILLED_SYRINGE | INTRAVENOUS | Status: DC | PRN
  Administered 2021-08-03: 70 mg via INTRAVENOUS

## 2021-08-03 MED ORDER — KETOROLAC TROMETHAMINE 30 MG/ML IJ SOLN
INTRAMUSCULAR | Status: AC
  Administered 2021-08-03: 30 mg via INTRAVENOUS
  Filled 2021-08-03: qty 1

## 2021-08-03 MED ORDER — LABETALOL HCL 5 MG/ML IV SOLN
INTRAVENOUS | Status: DC | PRN
  Administered 2021-08-03: 10 mg via INTRAVENOUS

## 2021-08-03 MED ORDER — SUCCINYLCHOLINE CHLORIDE 200 MG/10ML IV SOSY
PREFILLED_SYRINGE | INTRAVENOUS | Status: DC | PRN
  Administered 2021-08-03: 100 mg via INTRAVENOUS

## 2021-08-03 MED ORDER — SODIUM CHLORIDE 0.9 % IV SOLN
500.0000 mL | Freq: Once | INTRAVENOUS | Status: AC
Start: 2021-08-03 — End: 2021-08-03
  Administered 2021-08-03: 500 mL via INTRAVENOUS

## 2021-08-03 MED ORDER — SODIUM CHLORIDE 0.9 % IV SOLN: INTRAVENOUS | Status: DC | PRN

## 2021-08-03 MED ORDER — KETOROLAC TROMETHAMINE 30 MG/ML IJ SOLN: 30.0000 mg | Freq: Once | INTRAMUSCULAR | Status: AC

## 2021-08-03 MED ORDER — GLYCOPYRROLATE 0.2 MG/ML IJ SOLN
0.4000 mg | Freq: Once | INTRAMUSCULAR | Status: AC
Start: 2021-08-03 — End: 2021-08-03

## 2021-08-03 NOTE — Transfer of Care (Signed)
Immediate Anesthesia Transfer of Care Note  Patient: Melinda [name]  Procedure(s) Performed: ECT TX  Patient Location: PACU  Anesthesia Type:General  Level of Consciousness: drowsy  Airway & Oxygen Therapy: Patient Spontanous Breathing and Patient connected to face mask oxygen  Post-op Assessment: Report given to RN and Post -op Vital signs reviewed and stable  Post vital signs: Reviewed and stable  Last Vitals:  Vitals Value Taken Time  BP 163/101   Temp    Pulse 107 08/03/21 1331  Resp 21 08/03/21 1331  SpO2 100 % 08/03/21 1331  Vitals shown include unvalidated device data.  Last Pain:  Vitals:   08/03/21 1132  TempSrc: Oral  PainSc: 0-No pain         Complications: No notable events documented.

## 2021-08-03 NOTE — H&P (Signed)
Melinda [name] is an 50 y.o. female.   Chief Complaint: Patient with chronic dysphoria and anxiety nothing worse than usual.  No report of suicidal ideation. HPI: Chronic anxiety and depression with response and benefit from ECT treatment as part of maintenance  Past Medical History:  Diagnosis Date   Depression    Diabetes mellitus without complication ([place])    Diabetic peripheral neuropathy ([place]) 03/07/15   Diabetic peripheral neuropathy ([place]) 03/07/15   Diabetic peripheral neuropathy ([place]) 03/07/15   GERD (gastroesophageal reflux disease)    Hypercholesterolemia 03/07/15   Hypertension    Obesity 03/07/15   Personality disorder ([place]) 03/07/15   Sinus tachycardia 03/07/15   history of   Suicidal thoughts 03/07/15    Past Surgical History:  Procedure Laterality Date   COLONOSCOPY WITH PROPOFOL N/A 12/01/2018   Procedure: COLONOSCOPY WITH PROPOFOL;  Surgeon: Jonathon Bellows, MD;  Location: [facility] ENDOSCOPY;  Service: Gastroenterology;  Laterality: N/A;   electroconvulsion therapy  03/07/15    Family History  Problem Relation Age of Onset   Hypertension Father    Diabetes Mother    Social History:  reports that she has never smoked. She has never used smokeless tobacco. She reports that she does not drink alcohol and does not use drugs.  Allergies:  Allergies  Allergen Reactions   Prednisone     Increases blood sugar    (Not in a hospital admission)   Results for orders placed or performed during the hospital encounter of 08/03/21 (from the past 48 hour(s))  Glucose, capillary     Status: Abnormal   Collection Time: 08/03/21 11:39 AM  Result Value Ref Range   Glucose-Capillary 145 (H) 70 - 99 mg/dL    Comment: Glucose reference range applies only to samples taken after fasting for at least 8 hours.   *Note: Due to a large number of results and/or encounters for the requested time period, some results have not been displayed. A complete set of results can be found in Results  Review.   No results found.  Review of Systems  Constitutional: Negative.   HENT: Negative.    Eyes: Negative.   Respiratory: Negative.    Cardiovascular: Negative.   Gastrointestinal: Negative.   Musculoskeletal: Negative.   Skin: Negative.   Neurological: Negative.   Psychiatric/Behavioral: Negative.     Blood pressure 137/78, pulse (!) 59, temperature 97.8 F (36.6 C), temperature source Oral, resp. rate 16, height 5\' 4"  (1.626 m), weight 104.3 kg, SpO2 98 %. Physical Exam Vitals reviewed.  Constitutional:      Appearance: She is well-developed.  HENT:     Head: Normocephalic and atraumatic.  Eyes:     Conjunctiva/sclera: Conjunctivae normal.     Pupils: Pupils are equal, round, and reactive to light.  Cardiovascular:     Heart sounds: Normal heart sounds.  Pulmonary:     Effort: Pulmonary effort is normal.  Abdominal:     Palpations: Abdomen is soft.  Musculoskeletal:        General: Normal range of motion.     Cervical back: Normal range of motion.  Skin:    General: Skin is warm and dry.  Neurological:     General: No focal deficit present.     Mental Status: She is alert.  Psychiatric:        Mood and Affect: Mood normal.        Thought Content: Thought content normal.     Assessment/Plan Treatment today follow-up 3-week usual schedule.  [name], MD 08/03/2021, 12:04 PM

## 2021-08-03 NOTE — Anesthesia Postprocedure Evaluation (Signed)
Anesthesia Post Note  Patient: Melinda [name]  Procedure(s) Performed: ECT TX  Patient location during evaluation: PACU Anesthesia Type: General Level of consciousness: awake and alert Pain management: pain level controlled Vital Signs Assessment: post-procedure vital signs reviewed and stable Respiratory status: spontaneous breathing, nonlabored ventilation and respiratory function stable Cardiovascular status: blood pressure returned to baseline and stable Postop Assessment: no apparent nausea or vomiting Anesthetic complications: no   No notable events documented.   Last Vitals:  Vitals:   08/03/21 1400 08/03/21 1408  BP:  (!) 145/86  Pulse:  92  Resp: 17 16  Temp:  36.6 C  SpO2:      Last Pain:  Vitals:   08/03/21 1408  TempSrc: Oral  PainSc: 0-No pain                 Iran Ouch

## 2021-08-03 NOTE — Procedures (Signed)
ECT SERVICES Physician's Interval Evaluation & Treatment Note  Patient Identification: Melinda [name] MRN:  [id number] Date of Evaluation:  08/03/2021 TX #: [id number]  MADRS:   MMSE:   P.E. Findings:  No change to physical exam  Psychiatric Interval Note:  Mood stable no significant change  Subjective:  Patient is a 50 y.o. female seen for evaluation for Electroconvulsive Therapy. Chronic dysphoria no worse than usual  Treatment Summary:   []   Right Unilateral             [x]  Bilateral   % Energy : 1.0 ms 35%   Impedance: 1310 ohms  Seizure Energy Index: 4228 V squared  Postictal Suppression Index: 36%  Seizure Concordance Index: 92%  Medications  Pre Shock: Robinul 0.4 mg Toradol 30 mg labetalol 20 mg esmolol 20 mg Brevital 70 mg succinylcholine 100 mg  Post Shock:    Seizure Duration: 37 seconds EMG 77 seconds EEG   Comments: Patient doing well stable tolerating treatment.  Follow-up 3 weeks  Lungs:  [x]   Clear to auscultation               []  Other:   Heart:    [x]   Regular rhythm             []  irregular rhythm    [x]   Previous H&P reviewed, patient examined and there are NO CHANGES                 []   Previous H&P reviewed, patient examined and there are changes noted.   [name], MD 9/23/20224:49 PM

## 2021-08-03 NOTE — Anesthesia Preprocedure Evaluation (Addendum)
Anesthesia Evaluation  Patient identified by MRN, date of birth, ID band Patient awake    Reviewed: Allergy & Precautions, H&P , NPO status , Patient's Chart, lab work & pertinent test results  History of Anesthesia Complications Negative for: history of anesthetic complications  Airway Mallampati: II  TM Distance: >3 FB Neck ROM: full    Dental  (+) Poor Dentition, Chipped   Pulmonary sleep apnea , neg COPD,    Pulmonary exam normal breath sounds clear to auscultation       Cardiovascular Exercise Tolerance: Poor hypertension, Pt. on medications (-) CAD and (-) Past MI Normal cardiovascular exam Rhythm:regular Rate:Normal     Neuro/Psych PSYCHIATRIC DISORDERS Depression Bipolar Disorder  Neuromuscular disease    GI/Hepatic Neg liver ROS, GERD  Medicated,  Endo/Other  diabetes, Type 2, Oral Hypoglycemic Agents  Renal/GU negative Renal ROS  negative genitourinary   Musculoskeletal   Abdominal (+) + obese,   Peds  Hematology negative hematology ROS (+)   Anesthesia Other Findings Past Medical History: No date: Depression No date: Diabetes mellitus without complication (HCC) 05/27/95: Diabetic peripheral neuropathy ([place]) 03/07/15: Diabetic peripheral neuropathy ([place]) 03/07/15: Diabetic peripheral neuropathy (HCC) No date: GERD (gastroesophageal reflux disease) 03/07/15: Hypercholesterolemia No date: Hypertension 03/07/15: Obesity 03/07/15: Personality disorder 03/07/15: Sinus tachycardia ([place])     Comment: history of 03/07/15: Suicidal thoughts Past Surgical History: 03/07/15: electroconvulsion therapy BMI    Body Mass Index:  36.44 kg/m     Reproductive/Obstetrics                            Anesthesia Physical  Anesthesia Plan  ASA: 3  Anesthesia Plan: General   Post-op Pain Management:    Induction: Intravenous  PONV Risk Score and Plan: 3 and Ondansetron and TIVA  Airway  Management Planned: Mask  Additional Equipment:   Intra-op Plan:   Post-operative Plan:   Informed Consent: I have reviewed the patients History and Physical, chart, labs and discussed the procedure including the risks, benefits and alternatives for the proposed anesthesia with the patient or authorized representative who has indicated his/her understanding and acceptance.     Dental Advisory Given  Plan Discussed with: CRNA and Anesthesiologist  Anesthesia Plan Comments: (Patient consented for risks of anesthesia including but not limited to:  - adverse reactions to medications - risk of airway placement if required - damage to eyes, teeth, lips or other oral mucosa - nerve damage due to positioning  - sore throat or hoarseness - Damage to heart, brain, nerves, lungs, other parts of body or loss of life  Patient voiced understanding.)        Anesthesia Quick Evaluation

## 2021-08-03 NOTE — Anesthesia Procedure Notes (Signed)
Procedure Name: General with mask airway Date/Time: 08/03/2021 1:15 PM Performed by: Lily Peer, Conya Ellinwood, CRNA Pre-anesthesia Checklist: Suction available, Patient identified, Emergency Drugs available, Patient being monitored and Timeout performed Patient Re-evaluated:Patient Re-evaluated prior to induction Oxygen Delivery Method: Circle system utilized Preoxygenation: Pre-oxygenation with 100% oxygen Induction Type: IV induction Ventilation: Mask ventilation throughout procedure

## 2021-08-24 ENCOUNTER — Other Ambulatory Visit: Payer: Self-pay | Admitting: Psychiatry

## 2021-08-24 ENCOUNTER — Ambulatory Visit: Payer: Self-pay | Admitting: Registered Nurse

## 2021-08-24 ENCOUNTER — Encounter
Admission: RE | Admit: 2021-08-24 | Discharge: 2021-08-24 | Disposition: A | Discharge status: Home / Self Care | Payer: Medicare HMO | Source: Ambulatory Visit | Attending: Psychiatry | Admitting: Psychiatry

## 2021-08-24 ENCOUNTER — Other Ambulatory Visit: Payer: Self-pay

## 2021-08-24 ENCOUNTER — Encounter: Payer: Self-pay | Admitting: Registered Nurse

## 2021-08-24 DIAGNOSIS — F332 Major depressive disorder, recurrent severe without psychotic features: Secondary | ICD-10-CM | POA: Insufficient documentation

## 2021-08-24 LAB — GLUCOSE, CAPILLARY: Glucose-Capillary: 132 mg/dL — ABNORMAL HIGH (ref 70–99)

## 2021-08-24 MED ORDER — ESMOLOL HCL 100 MG/10ML IV SOLN
INTRAVENOUS | Status: DC | PRN
  Administered 2021-08-24: 30 mg via INTRAVENOUS

## 2021-08-24 MED ORDER — GLYCOPYRROLATE 0.2 MG/ML IJ SOLN
INTRAMUSCULAR | Status: AC
  Administered 2021-08-24: 0.4 mg via INTRAVENOUS
  Filled 2021-08-24: qty 2

## 2021-08-24 MED ORDER — SUCCINYLCHOLINE CHLORIDE 200 MG/10ML IV SOSY
PREFILLED_SYRINGE | INTRAVENOUS | Status: AC
  Filled 2021-08-24: qty 30

## 2021-08-24 MED ORDER — GLYCOPYRROLATE 0.2 MG/ML IJ SOLN: 0.4000 mg | Freq: Once | INTRAMUSCULAR | Status: AC

## 2021-08-24 MED ORDER — KETOROLAC TROMETHAMINE 30 MG/ML IJ SOLN
INTRAMUSCULAR | Status: AC
  Administered 2021-08-24: 30 mg via INTRAVENOUS
  Filled 2021-08-24: qty 1

## 2021-08-24 MED ORDER — ESMOLOL HCL 100 MG/10ML IV SOLN
INTRAVENOUS | Status: AC
  Filled 2021-08-24: qty 10

## 2021-08-24 MED ORDER — LABETALOL HCL 5 MG/ML IV SOLN
INTRAVENOUS | Status: DC | PRN
  Administered 2021-08-24: 5 mg via INTRAVENOUS
  Administered 2021-08-24: 10 mg via INTRAVENOUS

## 2021-08-24 MED ORDER — SUCCINYLCHOLINE CHLORIDE 200 MG/10ML IV SOSY
PREFILLED_SYRINGE | INTRAVENOUS | Status: DC | PRN
  Administered 2021-08-24: 100 mg via INTRAVENOUS

## 2021-08-24 MED ORDER — PHENYLEPHRINE HCL (PRESSORS) 10 MG/ML IV SOLN
INTRAVENOUS | Status: AC
  Filled 2021-08-24: qty 1

## 2021-08-24 MED ORDER — SODIUM CHLORIDE 0.9 % IV SOLN: INTRAVENOUS | Status: DC | PRN

## 2021-08-24 MED ORDER — METHOHEXITAL SODIUM 0.5 G IJ SOLR
INTRAMUSCULAR | Status: AC
  Filled 2021-08-24: qty 500

## 2021-08-24 MED ORDER — LABETALOL HCL 5 MG/ML IV SOLN
INTRAVENOUS | Status: AC
  Filled 2021-08-24: qty 4

## 2021-08-24 MED ORDER — SODIUM CHLORIDE 0.9 % IV SOLN
500.0000 mL | Freq: Once | INTRAVENOUS | Status: AC
  Administered 2021-08-24: 500 mL via INTRAVENOUS

## 2021-08-24 MED ORDER — DEXMEDETOMIDINE HCL IN NACL 200 MCG/50ML IV SOLN
INTRAVENOUS | Status: AC
  Filled 2021-08-24: qty 50

## 2021-08-24 MED ORDER — METHOHEXITAL SODIUM 100 MG/10ML IV SOSY
PREFILLED_SYRINGE | INTRAVENOUS | Status: DC | PRN
Start: 2021-08-24 — End: 2021-08-24
  Administered 2021-08-24: 70 mg via INTRAVENOUS

## 2021-08-24 MED ORDER — KETOROLAC TROMETHAMINE 30 MG/ML IJ SOLN: 30.0000 mg | Freq: Once | INTRAMUSCULAR | Status: AC

## 2021-08-24 NOTE — H&P (Signed)
Melinda [name] is an 50 y.o. female.   Chief Complaint: Patient with no change from baseline.  Mild dysphoria chronically not severely depressed no suicidal thought or hallucinations HPI: History of chronic depression with improvement with ECT  Past Medical History:  Diagnosis Date   Depression    Diabetes mellitus without complication ([place])    Diabetic peripheral neuropathy ([place]) 03/07/15   Diabetic peripheral neuropathy ([place]) 03/07/15   Diabetic peripheral neuropathy ([place]) 03/07/15   GERD (gastroesophageal reflux disease)    Hypercholesterolemia 03/07/15   Hypertension    Obesity 03/07/15   Personality disorder ([place]) 03/07/15   Sinus tachycardia 03/07/15   history of   Suicidal thoughts 03/07/15    Past Surgical History:  Procedure Laterality Date   COLONOSCOPY WITH PROPOFOL N/A 12/01/2018   Procedure: COLONOSCOPY WITH PROPOFOL;  Surgeon: Jonathon Bellows, MD;  Location: [facility] ENDOSCOPY;  Service: Gastroenterology;  Laterality: N/A;   electroconvulsion therapy  03/07/15    Family History  Problem Relation Age of Onset   Hypertension Father    Diabetes Mother    Social History:  reports that she has never smoked. She has never used smokeless tobacco. She reports that she does not drink alcohol and does not use drugs.  Allergies:  Allergies  Allergen Reactions   Prednisone     Increases blood sugar    (Not in a hospital admission)   Results for orders placed or performed during the hospital encounter of 08/24/21 (from the past 48 hour(s))  Glucose, capillary     Status: Abnormal   Collection Time: 08/24/21 11:39 AM  Result Value Ref Range   Glucose-Capillary 132 (H) 70 - 99 mg/dL    Comment: Glucose reference range applies only to samples taken after fasting for at least 8 hours.   *Note: Due to a large number of results and/or encounters for the requested time period, some results have not been displayed. A complete set of results can be found in Results Review.   No results  found.  Review of Systems  Constitutional: Negative.   HENT: Negative.    Eyes: Negative.   Respiratory: Negative.    Cardiovascular: Negative.   Gastrointestinal: Negative.   Musculoskeletal: Negative.   Skin: Negative.   Neurological: Negative.   Psychiatric/Behavioral: Negative.     Blood pressure (!) 160/80, pulse 98, temperature 98.1 F (36.7 C), temperature source Tympanic, resp. rate 19, height 5\' 4"  (1.626 m), weight 104.2 kg, SpO2 97 %. Physical Exam Vitals and nursing note reviewed.  Constitutional:      Appearance: She is well-developed.  HENT:     Head: Normocephalic and atraumatic.  Eyes:     Conjunctiva/sclera: Conjunctivae normal.     Pupils: Pupils are equal, round, and reactive to light.  Cardiovascular:     Heart sounds: Normal heart sounds.  Pulmonary:     Effort: Pulmonary effort is normal.  Abdominal:     Palpations: Abdomen is soft.  Musculoskeletal:        General: Normal range of motion.     Cervical back: Normal range of motion.  Skin:    General: Skin is warm and dry.  Neurological:     General: No focal deficit present.     Mental Status: She is alert.  Psychiatric:        Mood and Affect: Mood normal.     Assessment/Plan Follow-up 3 weeks  [name], MD 08/24/2021, 5:03 PM

## 2021-08-24 NOTE — Transfer of Care (Signed)
Immediate Anesthesia Transfer of Care Note  Patient: Melinda [name]  Procedure(s) Performed: ECT TX  Patient Location: PACU  Anesthesia Type:General  Level of Consciousness: drowsy  Airway & Oxygen Therapy: Patient Spontanous Breathing and Patient connected to face mask oxygen  Post-op Assessment: Report given to RN and Post -op Vital signs reviewed and stable  Post vital signs: Reviewed and stable  Last Vitals:  Vitals Value Taken Time  BP 160/80 08/24/21 1341  Temp 36.7 C 08/24/21 1341  Pulse 98 08/24/21 1341  Resp 19 08/24/21 1341  SpO2 96 % 08/24/21 1337  Vitals shown include unvalidated device data.  Last Pain:  Vitals:   08/24/21 1341  TempSrc: Tympanic  PainSc: 0-No pain         Complications: No notable events documented.

## 2021-08-24 NOTE — Anesthesia Postprocedure Evaluation (Signed)
Anesthesia Post Note  Patient: Melinda [name]  Procedure(s) Performed: ECT TX  Patient location during evaluation: PACU Anesthesia Type: General Level of consciousness: awake and alert, oriented and patient cooperative Pain management: pain level controlled Vital Signs Assessment: post-procedure vital signs reviewed and stable Respiratory status: spontaneous breathing, nonlabored ventilation and respiratory function stable Cardiovascular status: blood pressure returned to baseline and stable Postop Assessment: adequate PO intake Anesthetic complications: no   No notable events documented.   Last Vitals:  Vitals:   08/24/21 1333 08/24/21 1341  BP:  (!) 160/80  Pulse: (!) 104 98  Resp: 20 19  Temp: 36.7 C 36.7 C  SpO2: 97%     Last Pain:  Vitals:   08/24/21 1341  TempSrc: Tympanic  PainSc: 0-No pain                 Darrin Nipper

## 2021-08-24 NOTE — Procedures (Signed)
ECT SERVICES Physician's Interval Evaluation & Treatment Note  Patient Identification: Melinda [name] MRN:  [id number] Date of Evaluation:  08/24/2021 TX #: [id number]  MADRS:   MMSE:   P.E. Findings:  No change physical exam  Psychiatric Interval Note:  Mood good no change  Subjective:  Patient is a 50 y.o. female seen for evaluation for Electroconvulsive Therapy. No complaint  Treatment Summary:   []   Right Unilateral             [x]  Bilateral   % Energy : 1.0 ms 35%   Impedance: 1230 ohms  Seizure Energy Index: 6482 V squared  Postictal Suppression Index: 55%  Seizure Concordance Index: 93%  Medications  Pre Shock: Robinul 0.4 mg labetalol 20 mg esmolol 20 mg Toradol 30 mg Brevital 70 mg succinylcholine 100 mg  Post Shock:    Seizure Duration: 41 seconds EMG 104 seconds EEG   Comments: Follow-up 3 weeks  Lungs:  [x]   Clear to auscultation               []  Other:   Heart:    [x]   Regular rhythm             []  irregular rhythm    [x]   Previous H&P reviewed, patient examined and there are NO CHANGES                 []   Previous H&P reviewed, patient examined and there are changes noted.   [name], MD 10/14/20225:11 PM

## 2021-08-24 NOTE — Anesthesia Preprocedure Evaluation (Addendum)
Anesthesia Evaluation  Patient identified by MRN, date of birth, ID band Patient awake    Reviewed: Allergy & Precautions, NPO status , Patient's Chart, lab work & pertinent test results  History of Anesthesia Complications Negative for: history of anesthetic complications  Airway Mallampati: II   Neck ROM: Full    Dental   Missing few molars:   Pulmonary sleep apnea ,    Pulmonary exam normal breath sounds clear to auscultation       Cardiovascular hypertension, Normal cardiovascular exam Rhythm:Regular Rate:Normal     Neuro/Psych PSYCHIATRIC DISORDERS Depression Bipolar Disorder  Neuromuscular disease (diabetic peripheral neuropathy)    GI/Hepatic GERD  ,  Endo/Other  diabetes  Renal/GU negative Renal ROS     Musculoskeletal   Abdominal   Peds  Hematology negative hematology ROS (+)   Anesthesia Other Findings   Reproductive/Obstetrics                             Anesthesia Physical Anesthesia Plan  ASA: 2  Anesthesia Plan: General   Post-op Pain Management:    Induction: Intravenous  PONV Risk Score and Plan: 3 and Propofol infusion, TIVA and Treatment may vary due to age or medical condition  Airway Management Planned: Natural Airway  Additional Equipment:   Intra-op Plan:   Post-operative Plan:   Informed Consent: I have reviewed the patients History and Physical, chart, labs and discussed the procedure including the risks, benefits and alternatives for the proposed anesthesia with the patient or authorized representative who has indicated his/her understanding and acceptance.     Plan/risks discussed with: serial consent on chart.  Plan Discussed with: CRNA  Anesthesia Plan Comments:        Anesthesia Quick Evaluation

## 2021-09-17 ENCOUNTER — Encounter
Admission: RE | Admit: 2021-09-17 | Discharge: 2021-09-17 | Disposition: A | Discharge status: Home / Self Care | Payer: Medicare HMO | Source: Ambulatory Visit | Attending: Psychiatry | Admitting: Psychiatry

## 2021-09-17 ENCOUNTER — Other Ambulatory Visit: Payer: Self-pay

## 2021-09-17 ENCOUNTER — Ambulatory Visit: Payer: Self-pay | Admitting: Certified Registered"

## 2021-09-17 ENCOUNTER — Other Ambulatory Visit: Payer: Self-pay | Admitting: Psychiatry

## 2021-09-17 ENCOUNTER — Encounter: Payer: Self-pay | Admitting: Certified Registered"

## 2021-09-17 DIAGNOSIS — E119 Type 2 diabetes mellitus without complications: Secondary | ICD-10-CM | POA: Insufficient documentation

## 2021-09-17 DIAGNOSIS — F332 Major depressive disorder, recurrent severe without psychotic features: Secondary | ICD-10-CM | POA: Diagnosis not present

## 2021-09-17 DIAGNOSIS — Z0100 Encounter for examination of eyes and vision without abnormal findings: Secondary | ICD-10-CM | POA: Diagnosis not present

## 2021-09-17 DIAGNOSIS — E7800 Pure hypercholesterolemia, unspecified: Secondary | ICD-10-CM | POA: Diagnosis not present

## 2021-09-17 LAB — GLUCOSE, CAPILLARY: Glucose-Capillary: 153 mg/dL — ABNORMAL HIGH (ref 70–99)

## 2021-09-17 MED ORDER — ESMOLOL HCL 100 MG/10ML IV SOLN
INTRAVENOUS | Status: DC | PRN
  Administered 2021-09-17: 30 mg via INTRAVENOUS

## 2021-09-17 MED ORDER — KETOROLAC TROMETHAMINE 30 MG/ML IJ SOLN
INTRAMUSCULAR | Status: AC
  Administered 2021-09-17: 30 mg via INTRAVENOUS
  Filled 2021-09-17: qty 1

## 2021-09-17 MED ORDER — SUCCINYLCHOLINE CHLORIDE 200 MG/10ML IV SOSY
PREFILLED_SYRINGE | INTRAVENOUS | Status: DC | PRN
  Administered 2021-09-17: 100 mg via INTRAVENOUS

## 2021-09-17 MED ORDER — FENTANYL CITRATE (PF) 100 MCG/2ML IJ SOLN: 25.0000 ug | INTRAMUSCULAR | Status: DC | PRN

## 2021-09-17 MED ORDER — KETOROLAC TROMETHAMINE 30 MG/ML IJ SOLN: 30.0000 mg | Freq: Once | INTRAMUSCULAR | Status: AC

## 2021-09-17 MED ORDER — GLYCOPYRROLATE 0.2 MG/ML IJ SOLN: 0.4000 mg | Freq: Once | INTRAMUSCULAR | Status: AC

## 2021-09-17 MED ORDER — ONDANSETRON HCL 4 MG/2ML IJ SOLN: 4.0000 mg | Freq: Once | INTRAMUSCULAR | Status: DC | PRN

## 2021-09-17 MED ORDER — METHOHEXITAL SODIUM 100 MG/10ML IV SOSY
PREFILLED_SYRINGE | INTRAVENOUS | Status: DC | PRN
  Administered 2021-09-17: 70 mg via INTRAVENOUS

## 2021-09-17 MED ORDER — SODIUM CHLORIDE 0.9 % IV SOLN
500.0000 mL | Freq: Once | INTRAVENOUS | Status: AC
  Administered 2021-09-17: 500 mL via INTRAVENOUS

## 2021-09-17 MED ORDER — LABETALOL HCL 5 MG/ML IV SOLN
INTRAVENOUS | Status: DC | PRN
  Administered 2021-09-17: 10 mg via INTRAVENOUS

## 2021-09-17 MED ORDER — GLYCOPYRROLATE 0.2 MG/ML IJ SOLN
INTRAMUSCULAR | Status: AC
  Administered 2021-09-17: 0.4 mg via INTRAVENOUS
  Filled 2021-09-17: qty 2

## 2021-09-17 NOTE — Transfer of Care (Signed)
Immediate Anesthesia Transfer of Care Note  Patient: Melinda [name]  Procedure(s) Performed: ECT TX  Patient Location: PACU  Anesthesia Type:General  Level of Consciousness: drowsy  Airway & Oxygen Therapy: Patient Spontanous Breathing and Patient connected to face mask oxygen  Post-op Assessment: Report given to RN and Post -op Vital signs reviewed and stable  Post vital signs: Reviewed  Last Vitals:  Vitals Value Taken Time  BP    Temp    Pulse 92 09/17/21 1334  Resp 28 09/17/21 1334  SpO2 95 % 09/17/21 1334  Vitals shown include unvalidated device data.  Last Pain:  Vitals:   09/17/21 1021  TempSrc:   PainSc: 0-No pain         Complications: No notable events documented.

## 2021-09-17 NOTE — Anesthesia Preprocedure Evaluation (Signed)
Anesthesia Evaluation  Patient identified by MRN, date of birth, ID band Patient awake    Reviewed: Allergy & Precautions, NPO status , Patient's Chart, lab work & pertinent test results  Airway Mallampati: II  TM Distance: >3 FB Neck ROM: full    Dental  (+) Teeth Intact   Pulmonary neg pulmonary ROS, sleep apnea ,    Pulmonary exam normal  + decreased breath sounds      Cardiovascular Exercise Tolerance: Good hypertension, Pt. on medications negative cardio ROS Normal cardiovascular exam Rhythm:Regular Rate:Normal     Neuro/Psych Depression Bipolar Disorder negative neurological ROS  negative psych ROS   GI/Hepatic negative GI ROS, Neg liver ROS, GERD  ,  Endo/Other  negative endocrine ROSdiabetes, Well Controlled, Type 2Morbid obesity  Renal/GU negative Renal ROS  negative genitourinary   Musculoskeletal negative musculoskeletal ROS (+)   Abdominal (+) + obese,   Peds negative pediatric ROS (+)  Hematology negative hematology ROS (+)   Anesthesia Other Findings Past Medical History: No date: Depression No date: Diabetes mellitus without complication ([place]) 8/59/29: Diabetic peripheral neuropathy ([place]) 03/07/15: Diabetic peripheral neuropathy ([place]) 03/07/15: Diabetic peripheral neuropathy (HCC) No date: GERD (gastroesophageal reflux disease) 03/07/15: Hypercholesterolemia No date: Hypertension 03/07/15: Obesity 03/07/15: Personality disorder ([place]) 03/07/15: Sinus tachycardia     Comment:  history of 03/07/15: Suicidal thoughts  Past Surgical History: 12/01/2018: COLONOSCOPY WITH PROPOFOL; N/A     Comment:  Procedure: COLONOSCOPY WITH PROPOFOL;  Surgeon: Jonathon Bellows, MD;  Location: [facility] ENDOSCOPY;  Service:               Gastroenterology;  Laterality: N/A; 03/07/15: electroconvulsion therapy  BMI    Body Mass Index: 39.65 kg/m      Reproductive/Obstetrics negative OB ROS                              Anesthesia Physical Anesthesia Plan  ASA: 3  Anesthesia Plan: General   Post-op Pain Management:    Induction: Intravenous  PONV Risk Score and Plan: Propofol infusion and TIVA  Airway Management Planned: Nasal Cannula and Mask  Additional Equipment:   Intra-op Plan:   Post-operative Plan:   Informed Consent: I have reviewed the patients History and Physical, chart, labs and discussed the procedure including the risks, benefits and alternatives for the proposed anesthesia with the patient or authorized representative who has indicated his/her understanding and acceptance.     Dental Advisory Given  Plan Discussed with: CRNA and Surgeon  Anesthesia Plan Comments:         Anesthesia Quick Evaluation

## 2021-09-21 NOTE — Anesthesia Postprocedure Evaluation (Signed)
Anesthesia Post Note  Patient: Melinda [name]  Procedure(s) Performed: ECT TX  Patient location during evaluation: PACU Anesthesia Type: General Level of consciousness: awake Pain management: pain level controlled Vital Signs Assessment: post-procedure vital signs reviewed and stable Respiratory status: spontaneous breathing Cardiovascular status: blood pressure returned to baseline Anesthetic complications: no   No notable events documented.   Last Vitals:  Vitals:   09/17/21 1353 09/17/21 1417  BP: 134/87 132/85  Pulse: 91 89  Resp:  18  Temp: 36.5 C 36.6 C  SpO2:      Last Pain:  Vitals:   09/17/21 1417  TempSrc: Tympanic  PainSc: 0-No pain                 VAN STAVEREN,Jannine Abreu

## 2021-09-21 NOTE — Procedures (Signed)
ECT SERVICES Physician's Interval Evaluation & Treatment Note  Patient Identification: Melinda [name] MRN:  [id number] Date of Evaluation:  09/17/2021 TX #: [id number]  MADRS:   MMSE:   P.E. Findings:  No change to physical exam  Psychiatric Interval Note:  Chronic mild depression  Subjective:  Patient is a 50 y.o. female seen for evaluation for Electroconvulsive Therapy. No new complaint  Treatment Summary:   []   Right Unilateral             [x]  Bilateral   % Energy : 1.0 ms 35%   Impedance: 1350 ohms  Seizure Energy Index: 4959 V squared  Postictal Suppression Index: 23%  Seizure Concordance Index: 93%  Medications  Pre Shock: Robinul 0.4 mg Toradol 30 mg labetalol 20 mg esmolol 20 mg Brevital 70 mg succinylcholine 100 mg  Post Shock:    Seizure Duration: 31 seconds EMG 80 seconds EEG   Comments: Follow-up 3 weeks  Lungs:  [x]   Clear to auscultation               []  Other:   Heart:    [x]   Regular rhythm             []  irregular rhythm    [x]   Previous H&P reviewed, patient examined and there are NO CHANGES                 []   Previous H&P reviewed, patient examined and there are changes noted.   [name], MD 11/11/20225:47 PM

## 2021-09-21 NOTE — H&P (Signed)
Melinda [name] is an 50 y.o. female.   Chief Complaint: Mood has been reasonably stable no change from normal HPI: History of recurrent severe depression with ECT management  Past Medical History:  Diagnosis Date   Depression    Diabetes mellitus without complication ([place])    Diabetic peripheral neuropathy ([place]) 03/07/15   Diabetic peripheral neuropathy ([place]) 03/07/15   Diabetic peripheral neuropathy ([place]) 03/07/15   GERD (gastroesophageal reflux disease)    Hypercholesterolemia 03/07/15   Hypertension    Obesity 03/07/15   Personality disorder ([place]) 03/07/15   Sinus tachycardia 03/07/15   history of   Suicidal thoughts 03/07/15    Past Surgical History:  Procedure Laterality Date   COLONOSCOPY WITH PROPOFOL N/A 12/01/2018   Procedure: COLONOSCOPY WITH PROPOFOL;  Surgeon: Jonathon Bellows, MD;  Location: [facility] ENDOSCOPY;  Service: Gastroenterology;  Laterality: N/A;   electroconvulsion therapy  03/07/15    Family History  Problem Relation Age of Onset   Hypertension Father    Diabetes Mother    Social History:  reports that she has never smoked. She has never used smokeless tobacco. She reports that she does not drink alcohol and does not use drugs.  Allergies:  Allergies  Allergen Reactions   Prednisone     Increases blood sugar    (Not in a hospital admission)   No results found for this or any previous visit (from the past 48 hour(s)). No results found.  Review of Systems  Constitutional: Negative.   HENT: Negative.    Eyes: Negative.   Respiratory: Negative.    Cardiovascular: Negative.   Gastrointestinal: Negative.   Musculoskeletal: Negative.   Skin: Negative.   Neurological: Negative.   Psychiatric/Behavioral: Negative.     Blood pressure 132/85, pulse 89, temperature 97.8 F (36.6 C), temperature source Tympanic, resp. rate 18, height 5\' 4"  (1.626 m), weight 104.8 kg, SpO2 100 %. Physical Exam Vitals reviewed.  Constitutional:      Appearance: She is  well-developed.  HENT:     Head: Normocephalic and atraumatic.  Eyes:     Conjunctiva/sclera: Conjunctivae normal.     Pupils: Pupils are equal, round, and reactive to light.  Cardiovascular:     Heart sounds: Normal heart sounds.  Pulmonary:     Effort: Pulmonary effort is normal.  Abdominal:     Palpations: Abdomen is soft.  Musculoskeletal:        General: Normal range of motion.     Cervical back: Normal range of motion.  Skin:    General: Skin is warm and dry.  Neurological:     General: No focal deficit present.     Mental Status: She is alert.  Psychiatric:        Mood and Affect: Mood normal.     Assessment/Plan Continue maintenance treatment in 3 weeks usually  [name], MD 09/17/2021, 5:46 PM

## 2021-09-21 NOTE — Anesthesia Postprocedure Evaluation (Signed)
Anesthesia Post Note  Patient: Melinda [name]  Procedure(s) Performed: ECT TX  Patient location during evaluation: PACU Anesthesia Type: General Level of consciousness: awake Pain management: pain level controlled Vital Signs Assessment: post-procedure vital signs reviewed and stable Respiratory status: spontaneous breathing and respiratory function stable Cardiovascular status: blood pressure returned to baseline Anesthetic complications: no   No notable events documented.   Last Vitals:  Vitals:   09/17/21 1353 09/17/21 1417  BP: 134/87 132/85  Pulse: 91 89  Resp:  18  Temp: 36.5 C 36.6 C  SpO2:      Last Pain:  Vitals:   09/17/21 1417  TempSrc: Tympanic  PainSc: 0-No pain                 VAN STAVEREN,Marigene Erler

## 2021-09-26 DIAGNOSIS — Z0100 Encounter for examination of eyes and vision without abnormal findings: Secondary | ICD-10-CM | POA: Diagnosis not present

## 2021-10-03 ENCOUNTER — Other Ambulatory Visit: Payer: Self-pay | Admitting: Family Medicine

## 2021-10-03 DIAGNOSIS — E785 Hyperlipidemia, unspecified: Secondary | ICD-10-CM

## 2021-10-03 DIAGNOSIS — E1169 Type 2 diabetes mellitus with other specified complication: Secondary | ICD-10-CM

## 2021-10-03 NOTE — Telephone Encounter (Signed)
Requested medications are due for refill today.  yes  Requested medications are on the active medications list.  yes  Last refill. 01/08/2021 and 05/16/2021  Future visit scheduled.   yes  Notes to clinic.  There are 2 rx's for the same medication.

## 2021-10-09 ENCOUNTER — Other Ambulatory Visit: Payer: Self-pay | Admitting: Psychiatry

## 2021-10-09 MED ORDER — ZIPRASIDONE HCL 80 MG PO CAPS
ORAL_CAPSULE | ORAL | 1 refills | Status: DC
Start: 2021-10-09 — End: 2021-12-05

## 2021-10-09 MED ORDER — BUPROPION HCL ER (XL) 300 MG PO TB24: 300.0000 mg | ORAL_TABLET | Freq: Every day | ORAL | 1 refills | Status: DC

## 2021-10-09 MED ORDER — ESCITALOPRAM OXALATE 20 MG PO TABS: 20.0000 mg | ORAL_TABLET | Freq: Every day | ORAL | 1 refills | Status: DC

## 2021-10-11 ENCOUNTER — Other Ambulatory Visit: Payer: Self-pay | Admitting: Psychiatry

## 2021-10-12 ENCOUNTER — Encounter
Admission: RE | Admit: 2021-10-12 | Discharge: 2021-10-12 | Disposition: A | Discharge status: Home / Self Care | Payer: Medicare HMO | Source: Ambulatory Visit | Attending: Psychiatry | Admitting: Psychiatry

## 2021-10-12 ENCOUNTER — Other Ambulatory Visit: Payer: Self-pay

## 2021-10-12 ENCOUNTER — Encounter: Payer: Self-pay | Admitting: Anesthesiology

## 2021-10-12 ENCOUNTER — Ambulatory Visit: Payer: Self-pay | Admitting: Anesthesiology

## 2021-10-12 DIAGNOSIS — F32A Depression, unspecified: Secondary | ICD-10-CM | POA: Diagnosis not present

## 2021-10-12 DIAGNOSIS — F332 Major depressive disorder, recurrent severe without psychotic features: Secondary | ICD-10-CM | POA: Diagnosis not present

## 2021-10-12 DIAGNOSIS — G4730 Sleep apnea, unspecified: Secondary | ICD-10-CM | POA: Diagnosis not present

## 2021-10-12 DIAGNOSIS — E119 Type 2 diabetes mellitus without complications: Secondary | ICD-10-CM | POA: Diagnosis not present

## 2021-10-12 LAB — GLUCOSE, CAPILLARY: Glucose-Capillary: 138 mg/dL — ABNORMAL HIGH (ref 70–99)

## 2021-10-12 MED ORDER — SUCCINYLCHOLINE CHLORIDE 200 MG/10ML IV SOSY
PREFILLED_SYRINGE | INTRAVENOUS | Status: AC
  Filled 2021-10-12: qty 10

## 2021-10-12 MED ORDER — ESMOLOL HCL 100 MG/10ML IV SOLN
INTRAVENOUS | Status: AC
  Filled 2021-10-12: qty 10

## 2021-10-12 MED ORDER — FENTANYL CITRATE (PF) 100 MCG/2ML IJ SOLN: 25.0000 ug | INTRAMUSCULAR | Status: DC | PRN

## 2021-10-12 MED ORDER — SODIUM CHLORIDE 0.9 % IV SOLN: INTRAVENOUS | Status: DC | PRN

## 2021-10-12 MED ORDER — METHOHEXITAL SODIUM 100 MG/10ML IV SOSY
PREFILLED_SYRINGE | INTRAVENOUS | Status: DC | PRN
  Administered 2021-10-12: 70 mg via INTRAVENOUS

## 2021-10-12 MED ORDER — SODIUM CHLORIDE 0.9 % IV SOLN
500.0000 mL | Freq: Once | INTRAVENOUS | Status: AC
  Administered 2021-10-12: 500 mL via INTRAVENOUS

## 2021-10-12 MED ORDER — GLYCOPYRROLATE 0.2 MG/ML IJ SOLN
INTRAMUSCULAR | Status: AC
  Administered 2021-10-12: 0.4 mg via INTRAVENOUS
  Filled 2021-10-12: qty 2

## 2021-10-12 MED ORDER — LABETALOL HCL 5 MG/ML IV SOLN
INTRAVENOUS | Status: AC
  Filled 2021-10-12: qty 4

## 2021-10-12 MED ORDER — ONDANSETRON HCL 4 MG/2ML IJ SOLN: 4.0000 mg | Freq: Once | INTRAMUSCULAR | Status: DC | PRN

## 2021-10-12 MED ORDER — GLYCOPYRROLATE 0.2 MG/ML IJ SOLN: 0.4000 mg | Freq: Once | INTRAMUSCULAR | Status: AC

## 2021-10-12 MED ORDER — KETOROLAC TROMETHAMINE 30 MG/ML IJ SOLN
INTRAMUSCULAR | Status: AC
  Administered 2021-10-12: 30 mg via INTRAVENOUS
  Filled 2021-10-12: qty 1

## 2021-10-12 MED ORDER — ESMOLOL HCL 100 MG/10ML IV SOLN
INTRAVENOUS | Status: DC | PRN
  Administered 2021-10-12: 30 mg via INTRAVENOUS

## 2021-10-12 MED ORDER — LABETALOL HCL 5 MG/ML IV SOLN
INTRAVENOUS | Status: DC | PRN
  Administered 2021-10-12: 10 mg via INTRAVENOUS

## 2021-10-12 MED ORDER — SUCCINYLCHOLINE CHLORIDE 200 MG/10ML IV SOSY
PREFILLED_SYRINGE | INTRAVENOUS | Status: DC | PRN
  Administered 2021-10-12: 100 mg via INTRAVENOUS

## 2021-10-12 MED ORDER — KETOROLAC TROMETHAMINE 30 MG/ML IJ SOLN: 30.0000 mg | Freq: Once | INTRAMUSCULAR | Status: AC

## 2021-10-12 NOTE — H&P (Signed)
Melinda [name] is an 50 y.o. female.   Chief Complaint: Patient reports that mood has been stable.  Chronic low-level dysphoria and no severe depression HPI: Chronic depression with stability much enhanced with maintenance ECT  Past Medical History:  Diagnosis Date   Depression    Diabetes mellitus without complication ([place])    Diabetic peripheral neuropathy ([place]) 03/07/15   Diabetic peripheral neuropathy ([place]) 03/07/15   Diabetic peripheral neuropathy ([place]) 03/07/15   GERD (gastroesophageal reflux disease)    Hypercholesterolemia 03/07/15   Hypertension    Obesity 03/07/15   Personality disorder ([place]) 03/07/15   Sinus tachycardia 03/07/15   history of   Suicidal thoughts 03/07/15    Past Surgical History:  Procedure Laterality Date   COLONOSCOPY WITH PROPOFOL N/A 12/01/2018   Procedure: COLONOSCOPY WITH PROPOFOL;  Surgeon: Jonathon Bellows, MD;  Location: [facility] ENDOSCOPY;  Service: Gastroenterology;  Laterality: N/A;   electroconvulsion therapy  03/07/15    Family History  Problem Relation Age of Onset   Hypertension Father    Diabetes Mother    Social History:  reports that she has never smoked. She has never used smokeless tobacco. She reports that she does not drink alcohol and does not use drugs.  Allergies:  Allergies  Allergen Reactions   Prednisone     Increases blood sugar    (Not in a hospital admission)   Results for orders placed or performed during the hospital encounter of 10/12/21 (from the past 48 hour(s))  Glucose, capillary     Status: Abnormal   Collection Time: 10/12/21 12:12 PM  Result Value Ref Range   Glucose-Capillary 138 (H) 70 - 99 mg/dL    Comment: Glucose reference range applies only to samples taken after fasting for at least 8 hours.   *Note: Due to a large number of results and/or encounters for the requested time period, some results have not been displayed. A complete set of results can be found in Results Review.   No results found.  Review  of Systems  Constitutional: Negative.   HENT: Negative.    Eyes: Negative.   Respiratory: Negative.    Cardiovascular: Negative.   Gastrointestinal: Negative.   Musculoskeletal: Negative.   Skin: Negative.   Neurological: Negative.   Psychiatric/Behavioral: Negative.     Blood pressure 116/71, pulse 86, temperature 98.5 F (36.9 C), temperature source Tympanic, resp. rate 16, height 5\' 3"  (1.6 m), weight 108.9 kg, SpO2 98 %. Physical Exam Vitals and nursing note reviewed.  Constitutional:      Appearance: She is well-developed.  HENT:     Head: Normocephalic and atraumatic.  Eyes:     Conjunctiva/sclera: Conjunctivae normal.     Pupils: Pupils are equal, round, and reactive to light.  Cardiovascular:     Heart sounds: Normal heart sounds.  Pulmonary:     Effort: Pulmonary effort is normal.  Abdominal:     Palpations: Abdomen is soft.  Musculoskeletal:        General: Normal range of motion.     Cervical back: Normal range of motion.  Skin:    General: Skin is warm and dry.  Neurological:     Mental Status: She is alert.  Psychiatric:        Mood and Affect: Mood normal.        Behavior: Behavior normal.        Thought Content: Thought content normal.     Assessment/Plan Treatment today.  Reviewed options in the next few weeks because  of our extended break over the holidays.  We will try to get her back in about 2 weeks before the break.  [name], MD 10/12/2021, 12:29 PM

## 2021-10-12 NOTE — Transfer of Care (Signed)
Immediate Anesthesia Transfer of Care Note  Patient: Melinda [name]  Procedure(s) Performed: ECT TX  Patient Location: PACU  Anesthesia Type:General  Level of Consciousness: awake, alert  and oriented  Airway & Oxygen Therapy: Patient Spontanous Breathing  Post-op Assessment: Report given to RN and Post -op Vital signs reviewed and stable  Post vital signs: Reviewed and stable  Last Vitals:  Vitals Value Taken Time  BP 132/77 10/12/21 1400  Temp 36.6 C 10/12/21 1348  Pulse 108 10/12/21 1400  Resp 28 10/12/21 1406  SpO2 94 % 10/12/21 1400  Vitals shown include unvalidated device data.  Last Pain:  Vitals:   10/12/21 1400  TempSrc:   PainSc: 0-No pain         Complications: No notable events documented.

## 2021-10-12 NOTE — Procedures (Signed)
ECT SERVICES Physician's Interval Evaluation & Treatment Note  Patient Identification: Melinda [name] MRN:  [id number] Date of Evaluation:  10/12/2021 TX #: [id number]  MADRS:   MMSE:   P.E. Findings:  No change to physical exam  Psychiatric Interval Note:  Mood about stable no new complaints  Subjective:  Patient is a 50 y.o. female seen for evaluation for Electroconvulsive Therapy. No complaints  Treatment Summary:   []   Right Unilateral             [x]  Bilateral   % Energy : 1.0 ms 35%   Impedance: 1440 ohms  Seizure Energy Index: 5317 V squared  Postictal Suppression Index: 91%  Seizure Concordance Index: 95%  Medications  Pre Shock: Robinul 0.4 mg Toradol 30 mg labetalol 20 mg esmolol 20 mg Brevital 70 mg succinylcholine 100 mg  Post Shock:    Seizure Duration: 31 seconds EMG 68 seconds EEG   Comments: Follow-up in about 2 weeks  Lungs:  [x]   Clear to auscultation               []  Other:   Heart:    [x]   Regular rhythm             []  irregular rhythm    [x]   Previous H&P reviewed, patient examined and there are NO CHANGES                 []   Previous H&P reviewed, patient examined and there are changes noted.   [name], MD 12/2/20225:20 PM

## 2021-10-12 NOTE — Anesthesia Preprocedure Evaluation (Signed)
Anesthesia Evaluation  Patient identified by MRN, date of birth, ID band Patient awake    Reviewed: Allergy & Precautions, NPO status , Patient's Chart, lab work & pertinent test results  Airway Mallampati: II  TM Distance: >3 FB Neck ROM: full    Dental  (+) Teeth Intact   Pulmonary neg pulmonary ROS, sleep apnea ,    Pulmonary exam normal  + decreased breath sounds      Cardiovascular Exercise Tolerance: Good hypertension, Pt. on medications negative cardio ROS Normal cardiovascular exam Rhythm:Regular     Neuro/Psych Depression Bipolar Disorder negative neurological ROS  negative psych ROS   GI/Hepatic negative GI ROS, Neg liver ROS, GERD  ,  Endo/Other  negative endocrine ROSdiabetes, Well Controlled, Type 2  Renal/GU negative Renal ROS  negative genitourinary   Musculoskeletal negative musculoskeletal ROS (+)   Abdominal (+) + obese,   Peds negative pediatric ROS (+)  Hematology negative hematology ROS (+)   Anesthesia Other Findings Past Medical History: No date: Depression No date: Diabetes mellitus without complication ([place]) 2/99/24: Diabetic peripheral neuropathy ([place]) 03/07/15: Diabetic peripheral neuropathy ([place]) 03/07/15: Diabetic peripheral neuropathy (HCC) No date: GERD (gastroesophageal reflux disease) 03/07/15: Hypercholesterolemia No date: Hypertension 03/07/15: Obesity 03/07/15: Personality disorder ([place]) 03/07/15: Sinus tachycardia     Comment:  history of 03/07/15: Suicidal thoughts  Past Surgical History: 12/01/2018: COLONOSCOPY WITH PROPOFOL; N/A     Comment:  Procedure: COLONOSCOPY WITH PROPOFOL;  Surgeon: Jonathon Bellows, MD;  Location: [facility] ENDOSCOPY;  Service:               Gastroenterology;  Laterality: N/A; 03/07/15: electroconvulsion therapy  BMI    Body Mass Index: 42.51 kg/m      Reproductive/Obstetrics negative OB ROS                              Anesthesia Physical Anesthesia Plan  ASA: 3  Anesthesia Plan: General   Post-op Pain Management:    Induction: Intravenous  PONV Risk Score and Plan: Propofol infusion and TIVA  Airway Management Planned: Natural Airway and Mask  Additional Equipment:   Intra-op Plan:   Post-operative Plan:   Informed Consent: I have reviewed the patients History and Physical, chart, labs and discussed the procedure including the risks, benefits and alternatives for the proposed anesthesia with the patient or authorized representative who has indicated his/her understanding and acceptance.     Dental Advisory Given  Plan Discussed with: CRNA and Surgeon  Anesthesia Plan Comments:         Anesthesia Quick Evaluation

## 2021-10-12 NOTE — Anesthesia Postprocedure Evaluation (Signed)
Anesthesia Post Note  Patient: Melinda [name]  Procedure(s) Performed: ECT TX  Patient location during evaluation: PACU Anesthesia Type: General Level of consciousness: awake Pain management: pain level controlled Vital Signs Assessment: post-procedure vital signs reviewed and stable Respiratory status: spontaneous breathing and nonlabored ventilation Cardiovascular status: blood pressure returned to baseline Anesthetic complications: no   No notable events documented.   Last Vitals:  Vitals:   10/12/21 1400 10/12/21 1411  BP: 132/77 121/68  Pulse: (!) 108   Resp: (!) 22 (!) 27  Temp:    SpO2: 94%     Last Pain:  Vitals:   10/12/21 1400  TempSrc:   PainSc: 0-No pain                 VAN STAVEREN,Kiam Bransfield

## 2021-10-26 ENCOUNTER — Other Ambulatory Visit: Payer: Self-pay

## 2021-10-26 ENCOUNTER — Other Ambulatory Visit: Payer: Self-pay | Admitting: Psychiatry

## 2021-10-26 ENCOUNTER — Encounter: Payer: Self-pay | Admitting: Anesthesiology

## 2021-10-26 ENCOUNTER — Encounter
Admission: RE | Admit: 2021-10-26 | Discharge: 2021-10-26 | Disposition: A | Discharge status: Home / Self Care | Payer: Medicare HMO | Source: Ambulatory Visit | Attending: Psychiatry | Admitting: Psychiatry

## 2021-10-26 DIAGNOSIS — F32A Depression, unspecified: Secondary | ICD-10-CM | POA: Diagnosis not present

## 2021-10-26 DIAGNOSIS — E7800 Pure hypercholesterolemia, unspecified: Secondary | ICD-10-CM | POA: Diagnosis not present

## 2021-10-26 DIAGNOSIS — F332 Major depressive disorder, recurrent severe without psychotic features: Secondary | ICD-10-CM | POA: Diagnosis not present

## 2021-10-26 DIAGNOSIS — E119 Type 2 diabetes mellitus without complications: Secondary | ICD-10-CM | POA: Diagnosis not present

## 2021-10-26 LAB — GLUCOSE, CAPILLARY: Glucose-Capillary: 145 mg/dL — ABNORMAL HIGH (ref 70–99)

## 2021-10-26 MED ORDER — GLYCOPYRROLATE 0.2 MG/ML IJ SOLN: 0.4000 mg | Freq: Once | INTRAMUSCULAR | Status: AC

## 2021-10-26 MED ORDER — SUCCINYLCHOLINE CHLORIDE 200 MG/10ML IV SOSY
PREFILLED_SYRINGE | INTRAVENOUS | Status: AC
  Filled 2021-10-26: qty 10

## 2021-10-26 MED ORDER — GLYCOPYRROLATE 0.2 MG/ML IJ SOLN
INTRAMUSCULAR | Status: AC
  Administered 2021-10-26: 0.4 mg via INTRAVENOUS
  Filled 2021-10-26: qty 2

## 2021-10-26 MED ORDER — LABETALOL HCL 5 MG/ML IV SOLN
INTRAVENOUS | Status: DC | PRN
  Administered 2021-10-26: 10 mg via INTRAVENOUS

## 2021-10-26 MED ORDER — KETOROLAC TROMETHAMINE 30 MG/ML IJ SOLN: 30.0000 mg | Freq: Once | INTRAMUSCULAR | Status: AC

## 2021-10-26 MED ORDER — KETOROLAC TROMETHAMINE 30 MG/ML IJ SOLN
INTRAMUSCULAR | Status: AC
  Administered 2021-10-26: 30 mg via INTRAVENOUS
  Filled 2021-10-26: qty 1

## 2021-10-26 MED ORDER — METHOHEXITAL SODIUM 0.5 G IJ SOLR
INTRAMUSCULAR | Status: AC
  Filled 2021-10-26: qty 500

## 2021-10-26 MED ORDER — SUCCINYLCHOLINE CHLORIDE 200 MG/10ML IV SOSY
PREFILLED_SYRINGE | INTRAVENOUS | Status: DC | PRN
  Administered 2021-10-26: 100 mg via INTRAVENOUS

## 2021-10-26 MED ORDER — METHOHEXITAL SODIUM 100 MG/10ML IV SOSY
PREFILLED_SYRINGE | INTRAVENOUS | Status: DC | PRN
  Administered 2021-10-26: 70 mg via INTRAVENOUS

## 2021-10-26 MED ORDER — ESMOLOL HCL 100 MG/10ML IV SOLN
INTRAVENOUS | Status: AC
  Filled 2021-10-26: qty 10

## 2021-10-26 MED ORDER — LABETALOL HCL 5 MG/ML IV SOLN
INTRAVENOUS | Status: AC
  Filled 2021-10-26: qty 4

## 2021-10-26 MED ORDER — SODIUM CHLORIDE 0.9 % IV SOLN: 500.0000 mL | Freq: Once | INTRAVENOUS | Status: AC

## 2021-10-26 NOTE — Anesthesia Postprocedure Evaluation (Signed)
Anesthesia Post Note  Patient: Melinda [name]  Procedure(s) Performed: ECT TX  Patient location during evaluation: PACU Anesthesia Type: General Level of consciousness: awake Pain management: satisfactory to patient Vital Signs Assessment: post-procedure vital signs reviewed and stable Respiratory status: spontaneous breathing and respiratory function stable Cardiovascular status: stable Anesthetic complications: no   No notable events documented.   Last Vitals:  Vitals:   10/26/21 1240 10/26/21 1254  BP: (!) 138/93 130/85  Pulse: (!) 106 100  Resp: 20 18  Temp: 37.1 C 37.1 C  SpO2: 99%     Last Pain:  Vitals:   10/26/21 1254  TempSrc: Oral  PainSc: 0-No pain                 VAN STAVEREN,[name]

## 2021-10-26 NOTE — Transfer of Care (Signed)
Immediate Anesthesia Transfer of Care Note  Patient: Melinda [name]  Procedure(s) Performed: ECT TX  Patient Location: PACU  Anesthesia Type:General  Level of Consciousness: drowsy  Airway & Oxygen Therapy: Patient Spontanous Breathing and Patient connected to face mask oxygen  Post-op Assessment: Report given to RN and Post -op Vital signs reviewed and stable  Post vital signs: Reviewed and stable  Last Vitals:  Vitals Value Taken Time  BP 162/96 10/27/2019 1205  Temp    Pulse 112   Resp 24   SpO2 98     Last Pain:  Vitals:   10/26/21 1124  TempSrc: Oral  PainSc: 0-No pain         Complications: No notable events documented.

## 2021-10-26 NOTE — Anesthesia Procedure Notes (Signed)
Date/Time: 10/26/2021 11:57 AM Performed by: Doreen Salvage, CRNA Pre-anesthesia Checklist: Patient identified, Emergency Drugs available, Suction available and Patient being monitored Patient Re-evaluated:Patient Re-evaluated prior to induction Oxygen Delivery Method: Circle system utilized Preoxygenation: Pre-oxygenation with 100% oxygen Induction Type: IV induction Ventilation: Mask ventilation without difficulty and Mask ventilation throughout procedure Airway Equipment and Method: Bite block Placement Confirmation: positive ETCO2 Dental Injury: Teeth and Oropharynx as per pre-operative assessment

## 2021-10-26 NOTE — Anesthesia Preprocedure Evaluation (Signed)
Anesthesia Evaluation  Patient identified by MRN, date of birth, ID band Patient awake    Reviewed: Allergy & Precautions, NPO status , Patient's Chart, lab work & pertinent test results  Airway Mallampati: II  TM Distance: >3 FB Neck ROM: full    Dental  (+) Teeth Intact   Pulmonary neg pulmonary ROS, sleep apnea ,    Pulmonary exam normal  + decreased breath sounds      Cardiovascular Exercise Tolerance: Good hypertension, Pt. on medications negative cardio ROS Normal cardiovascular exam Rhythm:Regular     Neuro/Psych Depression Bipolar Disorder  Neuromuscular disease negative neurological ROS  negative psych ROS   GI/Hepatic negative GI ROS, Neg liver ROS, GERD  ,  Endo/Other  negative endocrine ROSdiabetes, Well Controlled, Type 2Morbid obesity  Renal/GU negative Renal ROS  negative genitourinary   Musculoskeletal negative musculoskeletal ROS (+)   Abdominal (+) + obese,   Peds negative pediatric ROS (+)  Hematology negative hematology ROS (+)   Anesthesia Other Findings Past Medical History: No date: Depression No date: Diabetes mellitus without complication ([place]) 5/63/14: Diabetic peripheral neuropathy ([place]) 03/07/15: Diabetic peripheral neuropathy ([place]) 03/07/15: Diabetic peripheral neuropathy (HCC) No date: GERD (gastroesophageal reflux disease) 03/07/15: Hypercholesterolemia No date: Hypertension 03/07/15: Obesity 03/07/15: Personality disorder ([place]) 03/07/15: Sinus tachycardia     Comment:  history of 03/07/15: Suicidal thoughts  Past Surgical History: 12/01/2018: COLONOSCOPY WITH PROPOFOL; N/A     Comment:  Procedure: COLONOSCOPY WITH PROPOFOL;  Surgeon: Jonathon Bellows, MD;  Location: [facility] ENDOSCOPY;  Service:               Gastroenterology;  Laterality: N/A; 03/07/15: electroconvulsion therapy  BMI    Body Mass Index: 42.96 kg/m      Reproductive/Obstetrics negative OB ROS                              Anesthesia Physical Anesthesia Plan  ASA: 3  Anesthesia Plan: General   Post-op Pain Management:    Induction: Intravenous  PONV Risk Score and Plan: Propofol infusion and TIVA  Airway Management Planned: Natural Airway and Mask  Additional Equipment:   Intra-op Plan:   Post-operative Plan:   Informed Consent: I have reviewed the patients History and Physical, chart, labs and discussed the procedure including the risks, benefits and alternatives for the proposed anesthesia with the patient or authorized representative who has indicated his/her understanding and acceptance.     Dental Advisory Given  Plan Discussed with: CRNA and Surgeon  Anesthesia Plan Comments:         Anesthesia Quick Evaluation

## 2021-11-16 NOTE — Progress Notes (Deleted)
Established patient visit   Patient: Melinda [name]   DOB: 1971-01-28   51 y.o. Female  MRN: [id number] Visit Date: 11/19/2021  Today's healthcare provider: Lavon Paganini, MD   No chief complaint on file.  Subjective    HPI  Diabetes Mellitus Type II, follow-up  Lab Results  Component Value Date   HGBA1C 6.3 (A) 03/15/2021   HGBA1C 6.6 (A) 11/16/2020   HGBA1C 6.2 (A) 05/16/2020   Last seen for diabetes 7 months ago.  Management since then includes continuing the same treatment. She reports {excellent/good/fair/poor:19665} compliance with treatment. She {is/is not:21021397} having side effects. {document side effects if present:1}  Home blood sugar records: {diabetes glucometry results:16657}  Episodes of hypoglycemia? {Yes/No:20286} {enter details if yes:1}   Current insulin regiment: none Most Recent Eye Exam: UTD  --------------------------------------------------------------------------------------------------- Hypertension, follow-up  BP Readings from Last 3 Encounters:  03/15/21 110/69  11/16/20 (!) 124/94  05/16/20 128/86   Wt Readings from Last 3 Encounters:  03/15/21 231 lb 3.2 oz (104.9 kg)  11/16/20 229 lb 8 oz (104.1 kg)  05/16/20 228 lb 6.4 oz (103.6 kg)     She was last seen for hypertension 7 months ago.  BP at that visit was 110/69. Management since that visit includes no changes. She reports {excellent/good/fair/poor:19665} compliance with treatment. She {is/is not:9024} having side effects. {document side effects if present:1} She {is/is not:9024} exercising. She {is/is not:9024} adherent to low salt diet.   Outside blood pressures are {enter patient reported home BP, or 'not being checked':1}.  She does not smoke.  Use of agents associated with hypertension: none.   --------------------------------------------------------------------------------------------------- Lipid/Cholesterol, follow-up  Last Lipid Panel: Lab Results   Component Value Date   CHOL 155 03/15/2021   LDLCALC 82 03/15/2021   HDL 53 03/15/2021   TRIG 113 03/15/2021    She was last seen for this 7 months ago.  Management since that visit includes no changes.  She reports {excellent/good/fair/poor:19665} compliance with treatment. She {is/is not:9024} having side effects. {document side effects if present:1}  Symptoms: {Yes/No:20286} appetite changes {Yes/No:20286} foot ulcerations  {Yes/No:20286} chest pain {Yes/No:20286} chest pressure/discomfort  {Yes/No:20286} dyspnea {Yes/No:20286} orthopnea  {Yes/No:20286} fatigue {Yes/No:20286} lower extremity edema  {Yes/No:20286} palpitations {Yes/No:20286} paroxysmal nocturnal dyspnea  {Yes/No:20286} nausea {Yes/No:20286} numbness or tingling of extremity  {Yes/No:20286} polydipsia {Yes/No:20286} polyuria  {Yes/No:20286} speech difficulty {Yes/No:20286} syncope   She is following a {diet:21022986} diet. Current exercise: {exercise JJKKX:38182}  Last metabolic panel Lab Results  Component Value Date   GLUCOSE 144 (H) 03/15/2021   NA 139 03/15/2021   K 4.8 03/15/2021   BUN 13 03/15/2021   CREATININE 0.79 03/15/2021   EGFR 90 03/15/2021   GFRNONAA 78 05/16/2020   CALCIUM 10.0 03/15/2021   AST 15 03/15/2021   ALT 15 03/15/2021   The 10-year ASCVD risk score (Arnett DK, et al., 2019) is: 8.2%  ---------------------------------------------------------------------------------------------------   Medications: Outpatient Medications Prior to Visit  Medication Sig   Accu-Chek Softclix Lancets lancets Use to check blood sugar daily   Alcohol Swabs (B-D SINGLE USE SWABS REGULAR) PADS USE AS DIRECTED   atorvastatin (LIPITOR) 10 MG tablet TAKE 1 TABLET AT BEDTIME   atorvastatin (LIPITOR) 10 MG tablet TAKE 1 TABLET EVERY DAY   Blood Glucose Monitoring Suppl (TRUE METRIX METER) w/Device KIT USE AS DIRECTED AS NEEDED  FOR  DIABETES  MELLITUS  TYPE 2   buPROPion (WELLBUTRIN XL) 300 MG 24 hr  tablet TAKE 1 TABLET EVERY DAY   buPROPion (  WELLBUTRIN XL) 300 MG 24 hr tablet Take 1 tablet (300 mg total) by mouth daily.   escitalopram (LEXAPRO) 20 MG tablet Take 1 tablet (20 mg total) by mouth daily.   lisinopril (ZESTRIL) 10 MG tablet Take 1 tablet (10 mg total) by mouth daily.   meloxicam (MOBIC) 7.5 MG tablet Take 1 tablet (7.5 mg total) by mouth daily.   metFORMIN (GLUCOPHAGE-XR) 500 MG 24 hr tablet TAKE 1 TABLET (500 MG TOTAL) BY MOUTH DAILY WITH BREAKFAST.   TRUE METRIX BLOOD GLUCOSE TEST test strip Check fasting blood sugar once daily   ziprasidone (GEODON) 80 MG capsule TAKE 1 CAPSULE TWICE DAILY   ziprasidone (GEODON) 80 MG capsule 1 by moth twice a day   No facility-administered medications prior to visit.    Review of Systems  {Labs   Heme   Chem   Endocrine   Serology   Results Review (optional):23779}   Objective    LMP  (LMP Unknown) Comment: pt states she has not had period in the past year {Show previous vital signs (optional):23777}  Physical Exam  ***  No results found for any visits on 11/19/21.  Assessment & Plan     ***  No follow-ups on file.      {provider attestation***:1}   Lavon Paganini, MD  [facility] [phone] (phone) [phone] (fax)  [place]

## 2021-11-19 ENCOUNTER — Encounter: Payer: Medicare HMO | Admitting: Family Medicine

## 2021-11-19 DIAGNOSIS — E1169 Type 2 diabetes mellitus with other specified complication: Secondary | ICD-10-CM

## 2021-11-19 DIAGNOSIS — I10 Essential (primary) hypertension: Secondary | ICD-10-CM

## 2021-11-19 DIAGNOSIS — E119 Type 2 diabetes mellitus without complications: Secondary | ICD-10-CM

## 2021-11-22 ENCOUNTER — Other Ambulatory Visit: Payer: Self-pay | Admitting: Psychiatry

## 2021-11-23 ENCOUNTER — Encounter: Payer: Self-pay | Admitting: Anesthesiology

## 2021-11-23 ENCOUNTER — Other Ambulatory Visit: Payer: Self-pay

## 2021-11-23 ENCOUNTER — Encounter
Admission: RE | Admit: 2021-11-23 | Discharge: 2021-11-23 | Disposition: A | Discharge status: Home / Self Care | Payer: Medicare HMO | Source: Ambulatory Visit | Attending: Psychiatry | Admitting: Psychiatry

## 2021-11-23 ENCOUNTER — Ambulatory Visit: Payer: Self-pay | Admitting: Anesthesiology

## 2021-11-23 DIAGNOSIS — Z6841 Body Mass Index (BMI) 40.0 and over, adult: Secondary | ICD-10-CM | POA: Diagnosis not present

## 2021-11-23 DIAGNOSIS — E785 Hyperlipidemia, unspecified: Secondary | ICD-10-CM | POA: Diagnosis not present

## 2021-11-23 DIAGNOSIS — Z1231 Encounter for screening mammogram for malignant neoplasm of breast: Secondary | ICD-10-CM | POA: Diagnosis not present

## 2021-11-23 DIAGNOSIS — E1159 Type 2 diabetes mellitus with other circulatory complications: Secondary | ICD-10-CM | POA: Insufficient documentation

## 2021-11-23 DIAGNOSIS — I152 Hypertension secondary to endocrine disorders: Secondary | ICD-10-CM | POA: Insufficient documentation

## 2021-11-23 DIAGNOSIS — Z23 Encounter for immunization: Secondary | ICD-10-CM | POA: Diagnosis not present

## 2021-11-23 DIAGNOSIS — E038 Other specified hypothyroidism: Secondary | ICD-10-CM | POA: Insufficient documentation

## 2021-11-23 DIAGNOSIS — E1169 Type 2 diabetes mellitus with other specified complication: Secondary | ICD-10-CM | POA: Diagnosis not present

## 2021-11-23 DIAGNOSIS — N898 Other specified noninflammatory disorders of vagina: Secondary | ICD-10-CM | POA: Insufficient documentation

## 2021-11-23 DIAGNOSIS — F332 Major depressive disorder, recurrent severe without psychotic features: Secondary | ICD-10-CM | POA: Diagnosis not present

## 2021-11-23 DIAGNOSIS — Z0000 Encounter for general adult medical examination without abnormal findings: Secondary | ICD-10-CM | POA: Diagnosis not present

## 2021-11-23 DIAGNOSIS — K219 Gastro-esophageal reflux disease without esophagitis: Secondary | ICD-10-CM | POA: Diagnosis not present

## 2021-11-23 LAB — GLUCOSE, CAPILLARY
Glucose-Capillary: 140 mg/dL — ABNORMAL HIGH (ref 70–99)
Glucose-Capillary: 189 mg/dL — ABNORMAL HIGH (ref 70–99)

## 2021-11-23 MED ORDER — SODIUM CHLORIDE 0.9 % IV SOLN: 500.0000 mL | Freq: Once | INTRAVENOUS | Status: DC

## 2021-11-23 MED ORDER — SUCCINYLCHOLINE CHLORIDE 200 MG/10ML IV SOSY
PREFILLED_SYRINGE | INTRAVENOUS | Status: DC | PRN
  Administered 2021-11-23: 100 mg via INTRAVENOUS

## 2021-11-23 MED ORDER — METHOHEXITAL SODIUM 100 MG/10ML IV SOSY
PREFILLED_SYRINGE | INTRAVENOUS | Status: DC | PRN
  Administered 2021-11-23: 70 mg via INTRAVENOUS

## 2021-11-23 MED ORDER — SODIUM CHLORIDE 0.9 % IV SOLN: INTRAVENOUS | Status: DC | PRN

## 2021-11-23 MED ORDER — GLYCOPYRROLATE 0.2 MG/ML IJ SOLN
0.4000 mg | Freq: Once | INTRAMUSCULAR | Status: AC
  Administered 2021-11-23: 0.4 mg via INTRAVENOUS

## 2021-11-23 MED ORDER — KETOROLAC TROMETHAMINE 30 MG/ML IJ SOLN
30.0000 mg | Freq: Once | INTRAMUSCULAR | Status: AC
  Administered 2021-11-23: 30 mg via INTRAVENOUS

## 2021-11-23 MED ORDER — GLYCOPYRROLATE 0.2 MG/ML IJ SOLN
INTRAMUSCULAR | Status: AC
  Filled 2021-11-23: qty 2

## 2021-11-23 MED ORDER — KETOROLAC TROMETHAMINE 30 MG/ML IJ SOLN
INTRAMUSCULAR | Status: AC
  Filled 2021-11-23: qty 1

## 2021-11-23 MED ORDER — LABETALOL HCL 5 MG/ML IV SOLN
INTRAVENOUS | Status: DC | PRN
  Administered 2021-11-23: 10 mg via INTRAVENOUS

## 2021-11-23 NOTE — Transfer of Care (Signed)
Immediate Anesthesia Transfer of Care Note  Patient: Melinda [name]  Procedure(s) Performed: ECT TX  Patient Location: PACU  Anesthesia Type:General  Level of Consciousness: awake, alert  and oriented  Airway & Oxygen Therapy: Patient Spontanous Breathing  Post-op Assessment: Report given to RN and Post -op Vital signs reviewed and stable  Post vital signs: Reviewed and stable  Last Vitals:  Vitals Value Taken Time  BP 144/88 11/23/21 1218  Temp    Pulse 107 11/23/21 1220  Resp 23 11/23/21 1220  SpO2 91 % 11/23/21 1220  Vitals shown include unvalidated device data.  Last Pain:  Vitals:   11/23/21 1159  TempSrc:   PainSc: 0-No pain         Complications: No notable events documented.

## 2021-11-23 NOTE — Anesthesia Preprocedure Evaluation (Signed)
Anesthesia Evaluation  Patient identified by MRN, date of birth, ID band Patient awake    Reviewed: Allergy & Precautions, H&P , NPO status , Patient's Chart, lab work & pertinent test results  History of Anesthesia Complications Negative for: history of anesthetic complications  Airway Mallampati: II  TM Distance: >3 FB Neck ROM: full    Dental  (+) Poor Dentition, Chipped   Pulmonary sleep apnea , neg COPD,    Pulmonary exam normal breath sounds clear to auscultation       Cardiovascular hypertension, Pt. on medications (-) CAD and (-) Past MI negative cardio ROS Normal cardiovascular exam Rhythm:regular Rate:Normal     Neuro/Psych PSYCHIATRIC DISORDERS Depression Bipolar Disorder  Neuromuscular disease negative neurological ROS     GI/Hepatic Neg liver ROS, GERD  Controlled,  Endo/Other  diabetes, Type 2, Oral Hypoglycemic Agents  Renal/GU negative Renal ROS  negative genitourinary   Musculoskeletal   Abdominal (+) + obese,   Peds  Hematology negative hematology ROS (+)   Anesthesia Other Findings Past Medical History: No date: Depression No date: Diabetes mellitus without complication (HCC) 03/07/15: Diabetic peripheral neuropathy (HCC) 03/07/15: Diabetic peripheral neuropathy (HCC) 03/07/15: Diabetic peripheral neuropathy (HCC) No date: GERD (gastroesophageal reflux disease) 03/07/15: Hypercholesterolemia No date: Hypertension 03/07/15: Obesity 03/07/15: Personality disorder 03/07/15: Sinus tachycardia (HCC)     Comment: history of 03/07/15: Suicidal thoughts Past Surgical History: 03/07/15: electroconvulsion therapy BMI    Body Mass Index:  36.44 kg/m     Reproductive/Obstetrics                             Anesthesia Physical  Anesthesia Plan  ASA: III  Anesthesia Plan: General   Post-op Pain Management:    Induction: Intravenous  PONV Risk Score and Plan: 2 and  Ondansetron  Airway Management Planned: Mask  Additional Equipment:   Intra-op Plan:   Post-operative Plan:   Informed Consent: I have reviewed the patients History and Physical, chart, labs and discussed the procedure including the risks, benefits and alternatives for the proposed anesthesia with the patient or authorized representative who has indicated his/her understanding and acceptance.     Dental Advisory Given  Plan Discussed with: CRNA and Anesthesiologist  Anesthesia Plan Comments: (Patient consented for risks of anesthesia including but not limited to:  - adverse reactions to medications - risk of airway placement if required - damage to eyes, teeth, lips or other oral mucosa - nerve damage due to positioning  - sore throat or hoarseness - Damage to heart, brain, nerves, lungs, other parts of body or loss of life  Patient voiced understanding.)        Anesthesia Quick Evaluation  

## 2021-11-23 NOTE — Anesthesia Postprocedure Evaluation (Signed)
Anesthesia Post Note  Patient: Melinda [name]  Procedure(s) Performed: ECT TX  Patient location during evaluation: PACU Anesthesia Type: General Level of consciousness: awake and alert Pain management: pain level controlled Vital Signs Assessment: post-procedure vital signs reviewed and stable Respiratory status: spontaneous breathing, nonlabored ventilation, respiratory function stable and patient connected to nasal cannula oxygen Cardiovascular status: blood pressure returned to baseline and stable Postop Assessment: no apparent nausea or vomiting Anesthetic complications: no   No notable events documented.   Last Vitals:  Vitals:   11/23/21 1230 11/23/21 1240  BP: (!) 141/81 (!) 145/94  Pulse: (!) 106 (!) 105  Resp: (!) 21 (!) 23  Temp:  (!) 36.4 C  SpO2: 95% 96%    Last Pain:  Vitals:   11/23/21 1240  TempSrc:   PainSc: 0-No pain                 Precious Haws Shya Kovatch

## 2021-11-26 ENCOUNTER — Ambulatory Visit: Payer: Medicare HMO | Admitting: Family Medicine

## 2021-11-26 ENCOUNTER — Ambulatory Visit: Payer: Self-pay

## 2021-11-26 NOTE — Telephone Encounter (Signed)
Pt called to speak with Dr. B about her right should pain/ pt stated doesnt know if its arthritis or gas/ she scheduled an appt for this afternoon but doesn't know if she can make that appt / please advise     Chief Complaint: Right shoulder pain Symptoms: Pain Frequency: Started 11/22/21 Pertinent Negatives: Patient denies weakness Disposition: [] ED /[] Urgent Care (no appt availability in office) / [] Appointment(In office/virtual)/ []  [facility]/ [] Home Care/ [] Refused Recommended Disposition /[] [facility]/ []  Follow-up with PCP Additional Notes: Agent made appointment.  Reason for Disposition  [1] MODERATE pain (e.g., interferes with normal activities) AND [2] present > 3 days  Answer Assessment - Initial Assessment Questions 1. ONSET: "When did the pain start?"     11/22/21 2. LOCATION: "Where is the pain located?"     Right shoulder 3. PAIN: "How bad is the pain?" (Scale 1-10; or mild, moderate, severe)   - MILD (1-3): doesn't interfere with normal activities   - MODERATE (4-7): interferes with normal activities (e.g., work or school) or awakens from sleep   - SEVERE (8-10): excruciating pain, unable to do any normal activities, unable to move arm at all due to pain     8 4. WORK OR EXERCISE: "Has there been any recent work or exercise that involved this part of the body?"     No 5. CAUSE: "What do you think is causing the shoulder pain?"     Unsure 6. OTHER SYMPTOMS: "Do you have any other symptoms?" (e.g., neck pain, swelling, rash, fever, numbness, weakness)     No 7. PREGNANCY: "Is there any chance you are pregnant?" "When was your last menstrual period?"     No  Protocols used: Shoulder Pain-A-AH

## 2021-11-26 NOTE — Progress Notes (Deleted)
Established patient visit   Patient: Melinda [name]   DOB: 05/01/1971   51 y.o. Female  MRN: [id number] Visit Date: 11/26/2021  Today's healthcare provider: Lavon Paganini, MD   No chief complaint on file.  Subjective    Shoulder Pain    ***  Medications: Outpatient Medications Prior to Visit  Medication Sig   Accu-Chek Softclix Lancets lancets Use to check blood sugar daily   Alcohol Swabs (B-D SINGLE USE SWABS REGULAR) PADS USE AS DIRECTED   atorvastatin (LIPITOR) 10 MG tablet TAKE 1 TABLET AT BEDTIME   atorvastatin (LIPITOR) 10 MG tablet TAKE 1 TABLET EVERY DAY   Blood Glucose Monitoring Suppl (TRUE METRIX METER) w/Device KIT USE AS DIRECTED AS NEEDED  FOR  DIABETES  MELLITUS  TYPE 2   buPROPion (WELLBUTRIN XL) 300 MG 24 hr tablet TAKE 1 TABLET EVERY DAY   buPROPion (WELLBUTRIN XL) 300 MG 24 hr tablet Take 1 tablet (300 mg total) by mouth daily.   escitalopram (LEXAPRO) 20 MG tablet Take 1 tablet (20 mg total) by mouth daily.   lisinopril (ZESTRIL) 10 MG tablet Take 1 tablet (10 mg total) by mouth daily.   meloxicam (MOBIC) 7.5 MG tablet Take 1 tablet (7.5 mg total) by mouth daily.   metFORMIN (GLUCOPHAGE-XR) 500 MG 24 hr tablet TAKE 1 TABLET (500 MG TOTAL) BY MOUTH DAILY WITH BREAKFAST.   TRUE METRIX BLOOD GLUCOSE TEST test strip Check fasting blood sugar once daily   ziprasidone (GEODON) 80 MG capsule TAKE 1 CAPSULE TWICE DAILY   ziprasidone (GEODON) 80 MG capsule 1 by moth twice a day   No facility-administered medications prior to visit.    Review of Systems  {Labs   Heme   Chem   Endocrine   Serology   Results Review (optional):23779}   Objective    LMP  (LMP Unknown) Comment: pt states she has not had period in the past year {Show previous vital signs (optional):23777}  Physical Exam  ***  No results found for any visits on 11/26/21.  Assessment & Plan     ***  No follow-ups on file.      {provider  attestation***:1}   Lavon Paganini, MD  [facility] [phone] (phone) [phone] (fax)  [place]

## 2021-11-29 ENCOUNTER — Other Ambulatory Visit: Payer: Self-pay

## 2021-11-29 ENCOUNTER — Ambulatory Visit (INDEPENDENT_AMBULATORY_CARE_PROVIDER_SITE_OTHER): Payer: Medicare HMO | Admitting: Physician Assistant

## 2021-11-29 VITALS — BP 120/79 | HR 84 | Temp 98.0°F | Wt 245.0 lb

## 2021-11-29 DIAGNOSIS — M25511 Pain in right shoulder: Secondary | ICD-10-CM | POA: Diagnosis not present

## 2021-11-29 DIAGNOSIS — R0689 Other abnormalities of breathing: Secondary | ICD-10-CM | POA: Diagnosis not present

## 2021-11-29 DIAGNOSIS — R197 Diarrhea, unspecified: Secondary | ICD-10-CM

## 2021-11-29 NOTE — Progress Notes (Signed)
Established patient visit   Patient: Melinda [name]   DOB: 03/29/1971   51 y.o. Female  MRN: [id number] Visit Date: 11/29/2021  Today's healthcare provider: Dani Gobble , PA-C   Introduced myself to the patient as a Journalist, newspaper and provided education on APPs in clinical practice.    CC: Right Shoulder pain and acute diarrhea  Subjective    Shoulder Pain  The pain is present in the right shoulder. This is a new problem. The current episode started in the past 7 days. The problem has been unchanged. The quality of the pain is described as sharp. Pertinent negatives include no fever, inability to bear weight, itching, joint locking, joint swelling, limited range of motion, numbness, stiffness or tingling. She has tried nothing for the symptoms.  Diarrhea  This is a new problem. The current episode started in the past 7 days. The problem occurs 2 to 4 times per day. The patient states that diarrhea does not awaken her from sleep. Associated symptoms include arthralgias (right shoulder pain), bloating and increased flatus. Pertinent negatives include no abdominal pain, chills, coughing, fever, headaches, myalgias, sweats, URI or vomiting.    Right Shoulder pain: states it is sharp across the superior aspect of shoulder and shoulder blade States pain is 8-9/10, began acutely but cannot recall precipitating injury Has tried a heating pad without relief Denies recent trauma, fall, injury States pain is constant, denies alleviating factors,  States pain is worse with movement and when she lays on it.  Has not tried OTC medications other than Nyquil     Abdominal bloating and diarrhea: States it began a little over a week ago Reports frequent liquid bowel movements starting today. Regular bowel movements have been interspersed for last week.  States this could be triggered by some types of food. Denies nausea, vomiting,  Reports belching  Has not taken anything for discomfort and  diarrhea Denies recent sick contacts     Medications: Outpatient Medications Prior to Visit  Medication Sig   Accu-Chek Softclix Lancets lancets Use to check blood sugar daily   Alcohol Swabs (B-D SINGLE USE SWABS REGULAR) PADS USE AS DIRECTED   atorvastatin (LIPITOR) 10 MG tablet TAKE 1 TABLET AT BEDTIME   atorvastatin (LIPITOR) 10 MG tablet TAKE 1 TABLET EVERY DAY   Blood Glucose Monitoring Suppl (TRUE METRIX METER) w/Device KIT USE AS DIRECTED AS NEEDED  FOR  DIABETES  MELLITUS  TYPE 2   buPROPion (WELLBUTRIN XL) 300 MG 24 hr tablet TAKE 1 TABLET EVERY DAY   buPROPion (WELLBUTRIN XL) 300 MG 24 hr tablet Take 1 tablet (300 mg total) by mouth daily.   escitalopram (LEXAPRO) 20 MG tablet Take 1 tablet (20 mg total) by mouth daily.   lisinopril (ZESTRIL) 10 MG tablet Take 1 tablet (10 mg total) by mouth daily.   meloxicam (MOBIC) 7.5 MG tablet Take 1 tablet (7.5 mg total) by mouth daily.   metFORMIN (GLUCOPHAGE-XR) 500 MG 24 hr tablet TAKE 1 TABLET (500 MG TOTAL) BY MOUTH DAILY WITH BREAKFAST.   TRUE METRIX BLOOD GLUCOSE TEST test strip Check fasting blood sugar once daily   ziprasidone (GEODON) 80 MG capsule TAKE 1 CAPSULE TWICE DAILY   ziprasidone (GEODON) 80 MG capsule 1 by moth twice a day   No facility-administered medications prior to visit.    Review of Systems  Constitutional:  Positive for fatigue. Negative for activity change, chills, fever and unexpected weight change.  HENT:  Positive for  rhinorrhea. Negative for congestion, ear pain, sinus pressure, sinus pain and sore throat.   Respiratory:  Negative for cough and shortness of breath.   Gastrointestinal:  Positive for abdominal distention, bloating, diarrhea and flatus. Negative for abdominal pain, nausea and vomiting.  Musculoskeletal:  Positive for arthralgias (right shoulder pain). Negative for myalgias and stiffness.  Skin:  Negative for itching.  Neurological:  Negative for tingling, numbness and headaches.       Objective    LMP  (LMP Unknown) Comment: pt states she has not had period in the past year {Show previous vital signs (optional):23777}  Physical Exam Constitutional:      Appearance: Normal appearance. She is obese.  HENT:     Head: Normocephalic and atraumatic.     Mouth/Throat:     Mouth: Mucous membranes are moist.     Pharynx: No oropharyngeal exudate or posterior oropharyngeal erythema.  Pulmonary:     Effort: Pulmonary effort is normal. No prolonged expiration or respiratory distress.     Breath sounds: Normal air entry. No decreased air movement. Examination of the right-middle field reveals decreased breath sounds. Examination of the right-lower field reveals decreased breath sounds. Decreased breath sounds present. No wheezing, rhonchi or rales.  Musculoskeletal:     Right shoulder: Tenderness present. Decreased range of motion. Decreased strength. Normal pulse.     Left shoulder: Normal. Normal range of motion. Normal pulse.     Right upper arm: Normal.     Left upper arm: Normal.     Right elbow: Normal. Normal range of motion.     Left elbow: Normal. Normal range of motion.     Comments: Negative drop arm test of right shoulder Reduced ROM with internal and external rotation Decreased flexion to approx 120 degrees in right shoulder vs left  Decreased extension to approx 30 degrees Pain with abduction and 4/5 strength with abduction  Neurological:     Mental Status: She is alert.  Psychiatric:        Attention and Perception: Attention normal.        Mood and Affect: Mood normal. Affect is flat.        Speech: Speech normal.        Behavior: Behavior normal. Behavior is cooperative.      No results found for any visits on 11/29/21.  Assessment & Plan     1. Acute pain of right shoulder Acute began last week, with reduced ROM and pain with movement Suspect rotator cuff involvement due to decreased ROM and strength Provided referral to Orthopedics for evaluation  and management Shoulder xray to rule out fracture or other bony injury Follow up as needed.  - DG Shoulder Right; Future - Ambulatory referral to Orthopedic Surgery  2. Acute diarrhea Initially described as acute but appears to be more intermittent and potentially secondary to dietary intake Recommend she keep a food and symptom journal to help discern pattern, stay hydrated and increase fiber intake She was advised she may use Pepto to assist with symptoms  Follow up as needed   3. Decreased breath sounds at right lung base Unsure of chronicity as patient denies associated symptoms Concerned for potential infectious process due to cc of acute diarrhea and shoulder pain which may represent systemic infection (such as flu, COVID) Results to guide further management   - DG Chest 2 View; Future - COVID-19, Flu A+B and RSV    No follow-ups on file.       I,Laura E  [place] as a Education administrator for [employer], PA-C.,have documented all relevant documentation on the behalf of  E , PA-C,as directed by  [employer], PA-C while in the presence of  E , PA-C.    The entirety of the information documented in the History of Present Illness, Review of Systems and Physical Exam were personally obtained by me. Portions of this information were initially documented by the CMA and reviewed by me for thoroughness and accuracy.   Dani Gobble , PA-C   Almon Register, PA-C  [employer] [phone] (phone) [phone] (fax)  [place]

## 2021-11-29 NOTE — Patient Instructions (Addendum)
° °  You were seen for the following   Right Shoulder pain and abdominal bloating / intermittent diarrhea  For your shoulder I am ordering an Xray and sending in a referral to Orthopedics as I am concerned about an issue with your rotator cuff You can take Tylenol and Ibuprofen for the pain  Use warm compresses to the area  Rest and do not life anything over 15 lbs   For your diarrhea: You can take pepto to help  I recommend keeping a meal and symptom journal to help determine if there is anything triggering your symptoms  You can slowly increase your daily fiber intake to help make your stools more firm and easier to pass Stay hydrated with plenty of water, Pedialyte   I have ordered a chest x ray because your breathing sounds were reduced on the right side and I want to assess for any infection or potential causes of that.   If  your symptoms are not improving or become worse let us know   It was nice to meet you and I appreciate the opportunity to be involved in your care

## 2021-11-30 DIAGNOSIS — R0689 Other abnormalities of breathing: Secondary | ICD-10-CM | POA: Diagnosis not present

## 2021-12-02 LAB — COVID-19, FLU A+B AND RSV
Influenza A, NAA: NOT DETECTED
Influenza B, NAA: NOT DETECTED
RSV, NAA: NOT DETECTED
SARS-CoV-2, NAA: NOT DETECTED

## 2021-12-02 LAB — SPECIMEN STATUS REPORT

## 2021-12-05 ENCOUNTER — Ambulatory Visit (INDEPENDENT_AMBULATORY_CARE_PROVIDER_SITE_OTHER): Payer: Medicare HMO

## 2021-12-05 DIAGNOSIS — Z0000 Encounter for general adult medical examination without abnormal findings: Secondary | ICD-10-CM | POA: Diagnosis not present

## 2021-12-05 NOTE — Progress Notes (Signed)
Virtual Visit via Telephone Note  I connected with  Melinda [name] on 12/05/21 at  9:40 AM EST by telephone and verified that I am speaking with the correct person using two identifiers.  Location: Patient: home Provider: [facility] Persons participating in the virtual visit: [place]   I discussed the limitations, risks, security and privacy concerns of performing an evaluation and management service by telephone and the availability of in person appointments. The patient expressed understanding and agreed to proceed.  Interactive audio and video telecommunications were attempted between this nurse and patient, however failed, due to patient having technical difficulties OR patient did not have access to video capability.  We continued and completed visit with audio only.  Some vital signs may be absent or patient reported.   Dionisio David, LPN  Subjective:   Melinda [name] is a 51 y.o. female who presents for Medicare Annual (Subsequent) preventive examination.  Review of Systems           Objective:    Today's Vitals   12/05/21 0943  PainSc: 8    There is no height or weight on file to calculate BMI.  Advanced Directives 11/28/2020 12/01/2018  Does Patient Have a Medical Advance Directive? No No  Would patient like information on creating a medical advance directive? No - Patient declined No - Patient declined    Current Medications (verified) Outpatient Encounter Medications as of 12/05/2021  Medication Sig   Accu-Chek Softclix Lancets lancets Use to check blood sugar daily   Alcohol Swabs (B-D SINGLE USE SWABS REGULAR) PADS USE AS DIRECTED   atorvastatin (LIPITOR) 10 MG tablet TAKE 1 TABLET AT BEDTIME   Blood Glucose Monitoring Suppl (TRUE METRIX METER) w/Device KIT USE AS DIRECTED AS NEEDED  FOR  DIABETES  MELLITUS  TYPE 2   buPROPion (WELLBUTRIN XL) 300 MG 24 hr tablet TAKE 1 TABLET EVERY DAY   escitalopram (LEXAPRO) 20 MG tablet Take 1 tablet (20  mg total) by mouth daily.   lisinopril (ZESTRIL) 10 MG tablet Take 1 tablet (10 mg total) by mouth daily.   meloxicam (MOBIC) 7.5 MG tablet Take 1 tablet (7.5 mg total) by mouth daily.   metFORMIN (GLUCOPHAGE-XR) 500 MG 24 hr tablet TAKE 1 TABLET (500 MG TOTAL) BY MOUTH DAILY WITH BREAKFAST.   TRUE METRIX BLOOD GLUCOSE TEST test strip Check fasting blood sugar once daily   ziprasidone (GEODON) 80 MG capsule TAKE 1 CAPSULE TWICE DAILY   [DISCONTINUED] atorvastatin (LIPITOR) 10 MG tablet TAKE 1 TABLET EVERY DAY   [DISCONTINUED] buPROPion (WELLBUTRIN XL) 300 MG 24 hr tablet Take 1 tablet (300 mg total) by mouth daily.   [DISCONTINUED] ziprasidone (GEODON) 80 MG capsule 1 by moth twice a day   No facility-administered encounter medications on file as of 12/05/2021.    Allergies (verified) Prednisone   History: Past Medical History:  Diagnosis Date   Depression    Diabetes mellitus without complication ([place])    Diabetic peripheral neuropathy ([place]) 03/07/15   Diabetic peripheral neuropathy ([place]) 03/07/15   Diabetic peripheral neuropathy ([place]) 03/07/15   GERD (gastroesophageal reflux disease)    Hypercholesterolemia 03/07/15   Hypertension    Obesity 03/07/15   Personality disorder ([place]) 03/07/15   Sinus tachycardia 03/07/15   history of   Suicidal thoughts 03/07/15   Past Surgical History:  Procedure Laterality Date   COLONOSCOPY WITH PROPOFOL N/A 12/01/2018   Procedure: COLONOSCOPY WITH PROPOFOL;  Surgeon: Jonathon Bellows, MD;  Location: [facility];  Service: Gastroenterology;  Laterality: N/A;   electroconvulsion therapy  03/07/15   Family History  Problem Relation Age of Onset   Hypertension Father    Diabetes Mother    Social History   Socioeconomic History   Marital status: Single    Spouse name: Not on file   Number of children: 1   Years of education: Network engineer degree   Highest education level: Some college, no degree  Occupational History   Occupation: Disabled  Tobacco  Use   Smoking status: Never   Smokeless tobacco: Never  Vaping Use   Vaping Use: Never used  Substance and Sexual Activity   Alcohol use: No   Drug use: No   Sexual activity: Not Currently    Birth control/protection: Abstinence  Other Topics Concern   Not on file  Social History Narrative   Not on file   Social Determinants of Health   Financial Resource Strain: Not on file  Food Insecurity: Not on file  Transportation Needs: Not on file  Physical Activity: Not on file  Stress: Not on file  Social Connections: Not on file    Tobacco Counseling Counseling given: Not Answered   Clinical Intake:  Pre-visit preparation completed: Yes  Pain : 0-10 Pain Score: 8  Pain Location: Shoulder Pain Orientation: Right Pain Descriptors / Indicators: Aching Pain Onset: 1 to 4 weeks ago Pain Frequency: Intermittent Pain Relieving Factors: takes ibuprofen  Pain Relieving Factors: takes ibuprofen  Nutritional Risks: None Diabetes: Yes CBG done?: No Did pt. bring in CBG monitor from home?: No  How often do you need to have someone help you when you read instructions, pamphlets, or other written materials from your doctor or pharmacy?: 1 - Never  Diabetic?yes Nutrition Risk Assessment:  Has the patient had any N/V/D within the last 2 months?  No  Does the patient have any non-healing wounds?  No  Has the patient had any unintentional weight loss or weight gain?  Yes   Diabetes:  Is the patient diabetic?  Yes  If diabetic, was a CBG obtained today?  No  Did the patient bring in their glucometer from home?  No  How often do you monitor your CBG's? Every morning.   Financial Strains and Diabetes Management:  Are you having any financial strains with the device, your supplies or your medication? No .  Does the patient want to be seen by Chronic Care Management for management of their diabetes?  No  Would the patient like to be referred to a Nutritionist or for Diabetic  Management?  No   Diabetic Exams:  Diabetic Eye Exam: Completed 05/07/21.  Diabetic Foot Exam: Completed 11/16/20. Pt has been advised about the importance in completing this exam.  Interpreter Needed?: No  Information entered by :: Kirke Shaggy, LPN   Activities of Daily Living In your present state of health, do you have any difficulty performing the following activities: 03/15/2021  Hearing? N  Vision? N  Difficulty concentrating or making decisions? N  Walking or climbing stairs? N  Dressing or bathing? N  Doing errands, shopping? N  Some recent data might be hidden    Patient Care Team: Virginia Crews, MD as PCP - General (Family Medicine) System, Provider Not In (Ophthalmology) Clapacs, Madie Reno, MD (Psychiatry)  Indicate any recent Medical Services you may have received from other than [facility] providers in the past year (date may be approximate).     Assessment:   This is a routine wellness  examination for Melinda [name].  Hearing/Vision screen No results found.  Dietary issues and exercise activities discussed:     Goals Addressed   None    Depression Screen PHQ 2/9 Scores 03/15/2021 11/28/2020 05/13/2019 11/12/2018 10/06/2018 06/14/2015  PHQ - 2 Score $Remov'2 2 4 3 4 'paRYSN$ -  PHQ- 9 Score $Remov'5 6 11 8 15 'oWSTSI$ -  Exception Documentation - - - - - Other- indicate reason in comment box  Not completed - - - - - Pt has severe depression; has weekly ECT Treatments, and being followed very closely by a psychiatry.    Fall Risk Fall Risk  03/15/2021 11/28/2020 11/12/2018 10/06/2018 06/14/2015  Falls in the past year? 1 1 0 0 No  Number falls in past yr: 0 0 0 0 -  Injury with Fall? 0 0 0 0 -  Risk for fall due to : History of fall(s) No Fall Risks - - -  Follow up Falls evaluation completed;Education provided Falls prevention discussed - - -    FALL RISK PREVENTION PERTAINING TO THE HOME:  Any stairs in or around the home? Yes  If so, are there any without handrails? No  Home free of loose throw  rugs in walkways, pet beds, electrical cords, etc? Yes  Adequate lighting in your home to reduce risk of falls? Yes   ASSISTIVE DEVICES UTILIZED TO PREVENT FALLS:  Life alert? No  Use of a cane, walker or w/c? No  Grab bars in the bathroom? No  Shower chair or bench in shower? No  Elevated toilet seat or a handicapped toilet? No   TCognitive Function: MMSE - Mini Mental State Exam 12/22/2015  Recall-comments " I can't remember"  Language- follow 3 step command-comments pt did not fold paper  Some encounter information is confidential and restricted. Go to Review Flowsheets activity to see all data.        Immunizations Immunization History  Administered Date(s) Administered   Hepatitis B 12/16/2013   Influenza,inj,Quad PF,6+ Mos 07/19/2013, 10/06/2018, 11/16/2020   PFIZER(Purple Top)SARS-COV-2 Vaccination 11/16/2020, 12/07/2020   Pneumococcal Polysaccharide-23 12/16/2013    TDAP status: Due, Education has been provided regarding the importance of this vaccine. Advised may receive this vaccine at local pharmacy or Health Dept. Aware to provide a copy of the vaccination record if obtained from local pharmacy or Health Dept. Verbalized acceptance and understanding.  Flu Vaccine status: Declined, Education has been provided regarding the importance of this vaccine but patient still declined. Advised may receive this vaccine at local pharmacy or Health Dept. Aware to provide a copy of the vaccination record if obtained from local pharmacy or Health Dept. Verbalized acceptance and understanding.  Pneumococcal vaccine status: Up to date  Covid-19 vaccine status: Completed vaccines  Qualifies for Shingles Vaccine? Yes   Zostavax completed No   Shingrix Completed?: No.    Education has been provided regarding the importance of this vaccine. Patient has been advised to call insurance company to determine out of pocket expense if they have not yet received this vaccine. Advised may also  receive vaccine at local pharmacy or Health Dept. Verbalized acceptance and understanding.  Screening Tests Health Maintenance  Topic Date Due   TETANUS/TDAP  Never done   Zoster Vaccines- Shingrix (1 of 2) Never done   COVID-19 Vaccine (3 - Pfizer risk series) 01/04/2021   HEMOGLOBIN A1C  09/15/2021   FOOT EXAM  11/16/2021   INFLUENZA VACCINE  02/08/2022 (Originally 06/11/2021)   OPHTHALMOLOGY EXAM  05/07/2022   MAMMOGRAM  02/01/2023   PAP SMEAR-Modifier  11/13/2023   COLONOSCOPY (Pts 45-55yrs Insurance coverage will need to be confirmed)  12/01/2028   Hepatitis C Screening  Completed   HIV Screening  Completed   HPV VACCINES  Aged Out    Health Maintenance  Health Maintenance Due  Topic Date Due   TETANUS/TDAP  Never done   Zoster Vaccines- Shingrix (1 of 2) Never done   COVID-19 Vaccine (3 - Pfizer risk series) 01/04/2021   HEMOGLOBIN A1C  09/15/2021   FOOT EXAM  11/16/2021    Colorectal cancer screening: Type of screening: Colonoscopy. Completed 12/01/18. Repeat every 10 years  Mammogram status: Completed 01/31/21. Repeat every year    Lung Cancer Screening: (Low Dose CT Chest recommended if Age 42-80 years, 30 pack-year currently smoking OR have quit w/in 15years.) does not qualify.     Additional Screening:  Hepatitis C Screening: does qualify; Completed 05/16/20  Vision Screening: Recommended annual ophthalmology exams for early detection of glaucoma and other disorders of the eye. Is the patient up to date with their annual eye exam?  Yes  Who is the provider or what is the name of the office in which the patient attends annual eye exams? [facility] If pt is not established with a provider, would they like to be referred to a provider to establish care? No .   Dental Screening: Recommended annual dental exams for proper oral hygiene  Community Resource Referral / Chronic Care Management: CRR required this visit?  No   CCM required this visit?  No       Plan:     I have personally reviewed and noted the following in the patients chart:   Medical and social history Use of alcohol, tobacco or illicit drugs  Current medications and supplements including opioid prescriptions.  Functional ability and status Nutritional status Physical activity Advanced directives List of other physicians Hospitalizations, surgeries, and ER visits in previous 12 months Vitals Screenings to include cognitive, depression, and falls Referrals and appointments  In addition, I have reviewed and discussed with patient certain preventive protocols, quality metrics, and best practice recommendations. A written personalized care plan for preventive services as well as general preventive health recommendations were provided to patient.     Dionisio David, LPN   [id number]   Nurse Notes: none

## 2021-12-06 ENCOUNTER — Other Ambulatory Visit: Payer: Self-pay

## 2021-12-06 ENCOUNTER — Ambulatory Visit (INDEPENDENT_AMBULATORY_CARE_PROVIDER_SITE_OTHER): Payer: Medicare HMO | Admitting: Family Medicine

## 2021-12-06 ENCOUNTER — Other Ambulatory Visit (HOSPITAL_COMMUNITY)
Admission: RE | Admit: 2021-12-06 | Discharge: 2021-12-06 | Disposition: A | Discharge status: Home / Self Care | Payer: Medicare HMO | Source: Ambulatory Visit | Attending: Family Medicine | Admitting: Family Medicine

## 2021-12-06 ENCOUNTER — Encounter: Payer: Self-pay | Admitting: Family Medicine

## 2021-12-06 VITALS — BP 132/80 | HR 97 | Temp 96.8°F | Ht 63.0 in | Wt 246.0 lb

## 2021-12-06 DIAGNOSIS — Z1231 Encounter for screening mammogram for malignant neoplasm of breast: Secondary | ICD-10-CM | POA: Diagnosis not present

## 2021-12-06 DIAGNOSIS — E038 Other specified hypothyroidism: Secondary | ICD-10-CM | POA: Diagnosis not present

## 2021-12-06 DIAGNOSIS — N898 Other specified noninflammatory disorders of vagina: Secondary | ICD-10-CM

## 2021-12-06 DIAGNOSIS — Z0000 Encounter for general adult medical examination without abnormal findings: Secondary | ICD-10-CM | POA: Diagnosis not present

## 2021-12-06 DIAGNOSIS — Z23 Encounter for immunization: Secondary | ICD-10-CM

## 2021-12-06 DIAGNOSIS — I152 Hypertension secondary to endocrine disorders: Secondary | ICD-10-CM

## 2021-12-06 DIAGNOSIS — E1169 Type 2 diabetes mellitus with other specified complication: Secondary | ICD-10-CM | POA: Diagnosis not present

## 2021-12-06 DIAGNOSIS — E785 Hyperlipidemia, unspecified: Secondary | ICD-10-CM

## 2021-12-06 DIAGNOSIS — F332 Major depressive disorder, recurrent severe without psychotic features: Secondary | ICD-10-CM

## 2021-12-06 DIAGNOSIS — E1159 Type 2 diabetes mellitus with other circulatory complications: Secondary | ICD-10-CM | POA: Diagnosis not present

## 2021-12-06 NOTE — Assessment & Plan Note (Signed)
Previously well controlled Continue statin Repeat FLP and CMP at next visit 

## 2021-12-06 NOTE — Progress Notes (Signed)
Complete physical exam   Patient: Melinda [name]   DOB: 05-Jan-1971   51 y.o. Female  MRN: [id number] Visit Date: 12/06/2021  Today's healthcare provider: Lavon Paganini, MD   Chief Complaint  Patient presents with   Annual Exam   Subjective    Melinda [name] is a 51 y.o. female who presents today for a complete physical exam.  She reports consuming a general diet. The patient does not participate in regular exercise at present. She generally feels fairly well. She reports sleeping poorly. She does have additional problems to discuss today.  HPI   Last mammo in 3/22  Vaginal discharge for 1 yr or more When she wipes after urinating, there is always discharge. Sometimes in her underwear too Clear discharge. No pain, itchy, bleeding. Sometimes malodorous.   Past Medical History:  Diagnosis Date   Depression    Diabetes mellitus without complication ([place])    Diabetic peripheral neuropathy ([place]) 03/07/15   Diabetic peripheral neuropathy ([place]) 03/07/15   Diabetic peripheral neuropathy ([place]) 03/07/15   GERD (gastroesophageal reflux disease)    Hypercholesterolemia 03/07/15   Hypertension    Obesity 03/07/15   Personality disorder ([place]) 03/07/15   Sinus tachycardia 03/07/15   history of   Suicidal thoughts 03/07/15   Past Surgical History:  Procedure Laterality Date   COLONOSCOPY WITH PROPOFOL N/A 12/01/2018   Procedure: COLONOSCOPY WITH PROPOFOL;  Surgeon: Jonathon Bellows, MD;  Location: [facility] END[facility]COPY;  Service: Gastroenterology;  Laterality: N/A;   electroconvulsion therapy  03/07/15   Social History   Socioeconomic History   Marital status: Single    Spouse name: Not on file   Number of children: 1   Years of education: Network engineer degree   Highest education level: Some college, no degree  Occupational History   Occupation: Disabled  Tobacco Use   Smoking status: Never   Smokeless tobacco: Never  Vaping Use   Vaping Use: Never used  Substance and Sexual Activity    Alcohol use: No   Drug use: No   Sexual activity: Not Currently    Birth control/protection: Abstinence  Other Topics Concern   Not on file  Social History Narrative   Not on file   Social Determinants of Health   Financial Resource Strain: Low Risk    Difficulty of Paying Living Expenses: Not hard at all  Food Insecurity: No Food Insecurity   Worried About Charity fundraiser in the Last Year: Never true   [place] in the Last Year: Never true  Transportation Needs: No Transportation Needs   Lack of Transportation (Medical): No   Lack of Transportation (Non-Medical): No  Physical Activity: Inactive   Days of Exercise per Week: 0 days   Minutes of Exercise per Session: 0 min  Stress: No Stress Concern Present   Feeling of Stress : Only a little  Social Connections: Socially Isolated   Frequency of Communication with Friends and Family: More than three times a week   Frequency of Social Gatherings with Friends and Family: Once a week   Attends Religious Services: Never   Marine scientist or Organizations: No   Attends Music therapist: Never   Marital Status: Never married  Human resources officer Violence: Not At Risk   Fear of Current or Ex-Partner: No   Emotionally Abused: No   Physically Abused: No   Sexually Abused: No   Family Status  Relation Name Status   Mother Arlyss Queen  Father Donnetta Simpers ( CB) Alive   Sister Facilities manager   Sister  Alive   Sister  Alive   Neg Hx  (Not Specified)   Family History  Problem Relation Age of Onset   Diabetes Mother    Hypertension Father    Breast cancer Neg Hx    Colon cancer Neg Hx    Allergies  Allergen Reactions   Prednisone     Increases blood sugar    Patient Care Team: Virginia Crews, MD as PCP - General (Family Medicine) System, Provider Not In (Ophthalmology) Clapacs, Madie Reno, MD (Psychiatry)   Medications: Outpatient Medications Prior to Visit  Medication Sig   Accu-Chek  Softclix Lancets lancets Use to check blood sugar daily   Alcohol Swabs (B-D SINGLE USE SWABS REGULAR) PADS USE AS DIRECTED   atorvastatin (LIPITOR) 10 MG tablet TAKE 1 TABLET AT BEDTIME   Blood Glucose Monitoring Suppl (TRUE METRIX METER) w/Device KIT USE AS DIRECTED AS NEEDED  FOR  DIABETES  MELLITUS  TYPE 2   buPROPion (WELLBUTRIN XL) 300 MG 24 hr tablet TAKE 1 TABLET EVERY DAY   escitalopram (LEXAPRO) 20 MG tablet Take 1 tablet (20 mg total) by mouth daily.   lisinopril (ZESTRIL) 10 MG tablet Take 1 tablet (10 mg total) by mouth daily.   meloxicam (MOBIC) 7.5 MG tablet Take 1 tablet (7.5 mg total) by mouth daily.   metFORMIN (GLUCOPHAGE-XR) 500 MG 24 hr tablet TAKE 1 TABLET (500 MG TOTAL) BY MOUTH DAILY WITH BREAKFAST.   TRUE METRIX BLOOD GLUCOSE TEST test strip Check fasting blood sugar once daily   ziprasidone (GEODON) 80 MG capsule TAKE 1 CAPSULE TWICE DAILY   No facility-administered medications prior to visit.    Review of Systems  Genitourinary:  Positive for vaginal discharge.  Musculoskeletal:  Positive for arthralgias.  All other systems reviewed and are negative.    Objective    BP 132/80 (BP Location: Right Arm, Patient Position: Sitting, Cuff Size: Large)    Pulse 97    Temp (!) 96.8 F (36 C) (Temporal)    Ht $R'5\' 3"'aL$  (1.6 m)    Wt 246 lb (111.6 kg)    LMP  (LMP Unknown) Comment: pt states she has not had period in the past year   SpO2 99%    BMI 43.58 kg/m    Physical Exam Vitals reviewed.  Constitutional:      General: She is not in acute distress.    Appearance: Normal appearance. She is well-developed. She is not diaphoretic.  HENT:     Head: Normocephalic and atraumatic.     Right Ear: Tympanic membrane, ear canal and external ear normal.     Left Ear: Tympanic membrane, ear canal and external ear normal.     Nose: Nose normal.     Mouth/Throat:     Mouth: Mucous membranes are moist.     Pharynx: Oropharynx is clear. No oropharyngeal exudate.  Eyes:      General: No scleral icterus.    Conjunctiva/sclera: Conjunctivae normal.     Pupils: Pupils are equal, round, and reactive to light.  Neck:     Thyroid: No thyromegaly.  Cardiovascular:     Rate and Rhythm: Normal rate and regular rhythm.     Pulses: Normal pulses.     Heart sounds: Normal heart sounds. No murmur heard. Pulmonary:     Effort: Pulmonary effort is normal. No respiratory distress.     Breath sounds: Normal breath sounds.  No wheezing or rales.  Abdominal:     General: There is no distension.     Palpations: Abdomen is soft.     Tenderness: There is no abdominal tenderness.  Genitourinary:    Comments: GYN:  External genitalia within normal limits.  Vaginal mucosa pink, moist, normal rugae.  Nonfriable cervix without lesions, thin white discharge, no bleeding noted on speculum exam.  Musculoskeletal:        General: No deformity.     Cervical back: Neck supple.     Right lower leg: No edema.     Left lower leg: No edema.  Lymphadenopathy:     Cervical: No cervical adenopathy.  Skin:    General: Skin is warm and dry.     Findings: No rash.  Neurological:     Mental Status: She is alert and oriented to person, place, and time. Mental status is at baseline.     Sensory: No sensory deficit.     Motor: No weakness.     Gait: Gait normal.  Psychiatric:        Mood and Affect: Mood normal.        Behavior: Behavior normal.        Thought Content: Thought content normal.      Last depression screening scores PHQ 2/9 Scores 12/06/2021 12/05/2021 03/15/2021  PHQ - 2 Score $Remov'4 2 2  'Dyvbcq$ PHQ- 9 Score $Remov'12 5 5  'exEsYN$ Exception Documentation - - -  Not completed - - -   Last fall risk screening Fall Risk  12/06/2021  Falls in the past year? 0  Number falls in past yr: 0  Injury with Fall? 0  Risk for fall due to : No Fall Risks  Follow up Falls evaluation completed   Last Audit-C alcohol use screening Alcohol Use Disorder Test (AUDIT) 12/06/2021  1. How often do you have a drink  containing alcohol? 0  2. How many drinks containing alcohol do you have on a typical day when you are drinking? 0  3. How often do you have six or more drinks on one occasion? 0  AUDIT-C Score 0  Alcohol Brief Interventions/Follow-up -   A score of 3 or more in women, and 4 or more in men indicates increased risk for alcohol abuse, EXCEPT if all of the points are from question 1   No results found for any visits on 12/06/21.  Assessment & Plan    Routine Health Maintenance and Physical Exam  Exercise Activities and Dietary recommendations  Goals      DIET - EAT MORE FRUITS AND VEGETABLES     Prevent falls     Recommend to remove any items from the home that may cause slips or trips.        Immunization History  Administered Date(s) Administered   Hepatitis B 12/16/2013   Influenza,inj,Quad PF,6+ Mos 07/19/2013, 10/06/2018, 11/16/2020   PFIZER(Purple Top)SARS-COV-2 Vaccination 11/16/2020, 12/07/2020   Pneumococcal Polysaccharide-23 12/16/2013    Health Maintenance  Topic Date Due   TETANUS/TDAP  Never done   Zoster Vaccines- Shingrix (1 of 2) Never done   COVID-19 Vaccine (3 - Pfizer risk series) 01/04/2021   HEMOGLOBIN A1C  09/15/2021   FOOT EXAM  11/16/2021   INFLUENZA VACCINE  02/08/2022 (Originally 06/11/2021)   OPHTHALMOLOGY EXAM  05/07/2022   MAMMOGRAM  02/01/2023   PAP SMEAR-Modifier  11/13/2023   COLONOSCOPY (Pts 45-30yrs Insurance coverage will need to be confirmed)  12/01/2028   Hepatitis C Screening  Completed  HIV Screening  Completed   HPV VACCINES  Aged Out    Discussed health benefits of physical activity, and encouraged her to engage in regular exercise appropriate for her age and condition.  Problem List Items Addressed This Visit       Cardiovascular and Mediastinum   Hypertension associated with diabetes ([place])    Well controlled Continue current medications Recheck metabolic panel F/u in 6 months       Relevant Orders   Basic Metabolic  Panel (BMET)     Endocrine   Diabetes mellitus type II, controlled ([place])    Well controlled Continue current medications UTD on vaccines, eye exam, foot exam On ACEi On Statin Discussed diet and exercise F/u in 6 months       Relevant Orders   Hemoglobin A1c   Hyperlipidemia associated with type 2 diabetes mellitus ([place])    Previously well controlled Continue statin Repeat FLP and CMP at next visit      Subclinical hypothyroidism    Recheck TSH and free T4      Relevant Orders   TSH + free T4     Other   Major depressive disorder, recurrent severe without psychotic features ([place])    F/b psych No changes today      Morbid obesity ([place])    Discussed importance of healthy weight management Discussed diet and exercise       Vaginal discharge    Likely physiologic discharge - discussed that this is normal Will check for BV and CV just in case for patient concern      Relevant Orders   Cervicovaginal ancillary only   Other Visit Diagnoses     Encounter for annual physical exam    -  Primary   Relevant Orders   Hemoglobin A1c   TSH + free T4   Basic Metabolic Panel (BMET)   Encounter for screening mammogram for malignant neoplasm of breast       Relevant Orders   MM 3D SCREEN BREAST BILATERAL        Return in about 6 months (around 06/05/2022) for chronic disease f/u.     I,Laura E Walsh,acting as a Education administrator for Lavon Paganini, MD.,have documented all relevant documentation on the behalf of Lavon Paganini, MD,as directed by  Lavon Paganini, MD while in the presence of Lavon Paganini, MD.  I, Lavon Paganini, MD, have reviewed all documentation for this visit. The documentation on 12/06/21 for the exam, diagnosis, procedures, and orders are all accurate and complete.   Lin Hackmann, Dionne Bucy, MD, MPH [facility]

## 2021-12-06 NOTE — Assessment & Plan Note (Signed)
Discussed importance of healthy weight management Discussed diet and exercise  

## 2021-12-06 NOTE — Assessment & Plan Note (Signed)
Well controlled Continue current medications UTD on vaccines, eye exam, foot exam On ACEi On Statin Discussed diet and exercise F/u in 6 months

## 2021-12-06 NOTE — Assessment & Plan Note (Signed)
Recheck TSH and free T4 

## 2021-12-06 NOTE — Assessment & Plan Note (Signed)
Well controlled Continue current medications Recheck metabolic panel F/u in 6 months  

## 2021-12-06 NOTE — Patient Instructions (Signed)
The CDC recommends two doses of Shingrix (the shingles vaccine) separated by 2 to 6 months for adults age 53 years and older. I recommend checking with your insurance plan regarding coverage for this vaccine.    Ask at the pharmacy about a tetanus shot (TDAP) and COVID booster

## 2021-12-06 NOTE — Assessment & Plan Note (Signed)
F/b psych No changes today

## 2021-12-06 NOTE — Assessment & Plan Note (Signed)
Likely physiologic discharge - discussed that this is normal Will check for BV and CV just in case for patient concern

## 2021-12-06 NOTE — Addendum Note (Signed)
Addended by: Ashley Royalty E on: 12/06/2021 03:21 PM   Modules accepted: Orders

## 2021-12-07 LAB — BASIC METABOLIC PANEL
BUN/Creatinine Ratio: 17 (ref 9–23)
BUN: 11 mg/dL (ref 6–24)
CO2: 23 mmol/L (ref 20–29)
Calcium: 9.7 mg/dL (ref 8.7–10.2)
Chloride: 101 mmol/L (ref 96–106)
Creatinine, Ser: 0.64 mg/dL (ref 0.57–1.00)
Glucose: 190 mg/dL — ABNORMAL HIGH (ref 70–99)
Potassium: 3.9 mmol/L (ref 3.5–5.2)
Sodium: 138 mmol/L (ref 134–144)
eGFR: 106 mL/min/{1.73_m2} (ref >59)

## 2021-12-07 LAB — TSH+FREE T4
Free T4: 1.29 ng/dL (ref 0.82–1.77)
TSH: 4.47 u[IU]/mL (ref 0.450–4.500)

## 2021-12-07 LAB — CERVICOVAGINAL ANCILLARY ONLY
Bacterial Vaginitis (gardnerella): NEGATIVE
Candida Glabrata: NEGATIVE
Candida Vaginitis: NEGATIVE
Comment: NEGATIVE
Comment: NEGATIVE
Comment: NEGATIVE

## 2021-12-07 LAB — HEMOGLOBIN A1C
Est. average glucose Bld gHb Est-mCnc: 189 mg/dL
Hgb A1c MFr Bld: 8.2 % — ABNORMAL HIGH (ref 4.8–5.6)

## 2021-12-10 DIAGNOSIS — M7541 Impingement syndrome of right shoulder: Secondary | ICD-10-CM | POA: Diagnosis not present

## 2021-12-14 ENCOUNTER — Other Ambulatory Visit: Payer: Self-pay | Admitting: Psychiatry

## 2021-12-14 ENCOUNTER — Encounter: Payer: Self-pay | Admitting: Certified Registered Nurse Anesthetist

## 2021-12-14 ENCOUNTER — Other Ambulatory Visit: Payer: Self-pay | Admitting: Family Medicine

## 2021-12-14 ENCOUNTER — Other Ambulatory Visit: Payer: Self-pay

## 2021-12-14 ENCOUNTER — Encounter
Admission: RE | Admit: 2021-12-14 | Discharge: 2021-12-14 | Disposition: A | Discharge status: Home / Self Care | Payer: Medicare HMO | Source: Ambulatory Visit | Attending: Psychiatry | Admitting: Psychiatry

## 2021-12-14 DIAGNOSIS — E119 Type 2 diabetes mellitus without complications: Secondary | ICD-10-CM | POA: Diagnosis not present

## 2021-12-14 DIAGNOSIS — F332 Major depressive disorder, recurrent severe without psychotic features: Secondary | ICD-10-CM | POA: Diagnosis not present

## 2021-12-14 DIAGNOSIS — F339 Major depressive disorder, recurrent, unspecified: Secondary | ICD-10-CM | POA: Insufficient documentation

## 2021-12-14 DIAGNOSIS — K219 Gastro-esophageal reflux disease without esophagitis: Secondary | ICD-10-CM | POA: Diagnosis not present

## 2021-12-14 LAB — GLUCOSE, CAPILLARY
Glucose-Capillary: 124 mg/dL — ABNORMAL HIGH (ref 70–99)
Glucose-Capillary: 157 mg/dL — ABNORMAL HIGH (ref 70–99)

## 2021-12-14 MED ORDER — SUCCINYLCHOLINE CHLORIDE 200 MG/10ML IV SOSY
PREFILLED_SYRINGE | INTRAVENOUS | Status: AC
  Filled 2021-12-14: qty 10

## 2021-12-14 MED ORDER — SUCCINYLCHOLINE CHLORIDE 200 MG/10ML IV SOSY
PREFILLED_SYRINGE | INTRAVENOUS | Status: DC | PRN
  Administered 2021-12-14: 100 mg via INTRAVENOUS

## 2021-12-14 MED ORDER — SODIUM CHLORIDE 0.9 % IV SOLN
500.0000 mL | Freq: Once | INTRAVENOUS | Status: AC
  Administered 2021-12-14: 500 mL via INTRAVENOUS

## 2021-12-14 MED ORDER — KETOROLAC TROMETHAMINE 30 MG/ML IJ SOLN
30.0000 mg | Freq: Once | INTRAMUSCULAR | Status: AC
  Administered 2021-12-14: 30 mg via INTRAVENOUS

## 2021-12-14 MED ORDER — GLYCOPYRROLATE 0.2 MG/ML IJ SOLN
INTRAMUSCULAR | Status: AC
  Filled 2021-12-14: qty 2

## 2021-12-14 MED ORDER — ESMOLOL HCL 100 MG/10ML IV SOLN
INTRAVENOUS | Status: DC | PRN
  Administered 2021-12-14: 30 mg via INTRAVENOUS

## 2021-12-14 MED ORDER — FENTANYL CITRATE (PF) 100 MCG/2ML IJ SOLN: 25.0000 ug | INTRAMUSCULAR | Status: DC | PRN

## 2021-12-14 MED ORDER — GLYCOPYRROLATE 0.2 MG/ML IJ SOLN
0.4000 mg | Freq: Once | INTRAMUSCULAR | Status: AC
  Administered 2021-12-14: 0.4 mg via INTRAVENOUS

## 2021-12-14 MED ORDER — LABETALOL HCL 5 MG/ML IV SOLN
INTRAVENOUS | Status: AC
  Filled 2021-12-14: qty 4

## 2021-12-14 MED ORDER — LABETALOL HCL 5 MG/ML IV SOLN
INTRAVENOUS | Status: DC | PRN
  Administered 2021-12-14: 10 mg via INTRAVENOUS

## 2021-12-14 MED ORDER — ESMOLOL HCL 100 MG/10ML IV SOLN
INTRAVENOUS | Status: AC
  Filled 2021-12-14: qty 10

## 2021-12-14 MED ORDER — METHOHEXITAL SODIUM 100 MG/10ML IV SOSY
PREFILLED_SYRINGE | INTRAVENOUS | Status: DC | PRN
  Administered 2021-12-14: 70 mg via INTRAVENOUS

## 2021-12-14 MED ORDER — ONDANSETRON HCL 4 MG/2ML IJ SOLN: 4.0000 mg | Freq: Once | INTRAMUSCULAR | Status: DC | PRN

## 2021-12-14 MED ORDER — KETOROLAC TROMETHAMINE 30 MG/ML IJ SOLN
INTRAMUSCULAR | Status: AC
  Filled 2021-12-14: qty 1

## 2021-12-14 NOTE — Transfer of Care (Signed)
Immediate Anesthesia Transfer of Care Note  Patient: Melinda [name]  Procedure(s) Performed: ECT TX  Patient Location: PACU  Anesthesia Type:General  Level of Consciousness: awake and alert   Airway & Oxygen Therapy: Patient Spontanous Breathing and Patient connected to face mask oxygen  Post-op Assessment: Report given to RN and Post -op Vital signs reviewed and stable  Post vital signs: Reviewed and stable  Last Vitals:  Vitals Value Taken Time  BP    Temp    Pulse    Resp    SpO2      Last Pain:  Vitals:   12/14/21 1117  TempSrc:   PainSc: 0-No pain         Complications: No notable events documented.

## 2021-12-14 NOTE — Procedures (Signed)
ECT SERVICES Physicians Interval Evaluation & Treatment Note  Patient Identification: Melinda [name] MRN:  [id number] Date of Evaluation:  12/14/2021 TX #: [id number]  MADRS:   MMSE:   P.E. Findings:  No change physical exam  Psychiatric Interval Note:  Stable mood and affect  Subjective:  Patient is a 51 y.o. female seen for evaluation for Electroconvulsive Therapy. No complaints  Treatment Summary:   []   Right Unilateral             [x]  Bilateral   % Energy : 1.0 ms 35%   Impedance: 1780 ohms  Seizure Energy Index: 2709 V squared  Postictal Suppression Index: 27%  Seizure Concordance Index: 94%  Medications  Pre Shock: Robinul 0.4 mg Toradol 30 mg labetalol 20 mg esmolol 20 mg Brevital 70 mg succinylcholine 100 mg  Post Shock:    Seizure Duration: 29 seconds EMG 51 seconds EEG   Comments: Follow-up 3 months  Lungs:  [x]   Clear to auscultation               []  Other:   Heart:    [x]   Regular rhythm             []  irregular rhythm    [x]   Previous H&P reviewed, patient examined and there are NO CHANGES                 []   Previous H&P reviewed, patient examined and there are changes noted.   [name], MD 2/3/20234:55 PM

## 2021-12-14 NOTE — Anesthesia Preprocedure Evaluation (Signed)
Anesthesia Evaluation  Patient identified by MRN, date of birth, ID band Patient awake    Reviewed: Allergy & Precautions, NPO status , Patient's Chart, lab work & pertinent test results  Airway Mallampati: III  TM Distance: >3 FB Neck ROM: full    Dental  (+) Teeth Intact   Pulmonary neg pulmonary ROS, sleep apnea ,    Pulmonary exam normal  + decreased breath sounds      Cardiovascular Exercise Tolerance: Good hypertension, Pt. on medications negative cardio ROS Normal cardiovascular exam Rhythm:Regular     Neuro/Psych Depression Bipolar Disorder negative neurological ROS  negative psych ROS   GI/Hepatic negative GI ROS, Neg liver ROS, GERD  ,  Endo/Other  negative endocrine ROSdiabetes, Well ControlledHypothyroidism   Renal/GU negative Renal ROS  negative genitourinary   Musculoskeletal negative musculoskeletal ROS (+)   Abdominal (+) + obese,   Peds  Hematology negative hematology ROS (+)   Anesthesia Other Findings Past Medical History: No date: Depression No date: Diabetes mellitus without complication ([place]) 02/18/80: Diabetic peripheral neuropathy ([place]) 03/07/15: Diabetic peripheral neuropathy ([place]) 03/07/15: Diabetic peripheral neuropathy ([facility]) No date: GERD (gastroesophageal reflux disease) 03/07/15: Hypercholesterolemia No date: Hypertension 03/07/15: Obesity 03/07/15: Personality disorder ([place]) 03/07/15: Sinus tachycardia     Comment:  history of 03/07/15: Suicidal thoughts  Past Surgical History: 12/01/2018: COLONOSCOPY WITH PROPOFOL; N/A     Comment:  Procedure: COLONOSCOPY WITH PROPOFOL;  Surgeon: Jonathon Bellows, MD;  Location: [facility] ENDOSCOPY;  Service:               Gastroenterology;  Laterality: N/A; 03/07/15: electroconvulsion therapy  BMI    Body Mass Index: 43.58 kg/m      Reproductive/Obstetrics negative OB ROS                             Anesthesia  Physical Anesthesia Plan  ASA: 3  Anesthesia Plan: General   Post-op Pain Management:    Induction: Intravenous  PONV Risk Score and Plan: Propofol infusion and TIVA  Airway Management Planned: Mask and Natural Airway  Additional Equipment:   Intra-op Plan:   Post-operative Plan:   Informed Consent: I have reviewed the patients History and Physical, chart, labs and discussed the procedure including the risks, benefits and alternatives for the proposed anesthesia with the patient or authorized representative who has indicated his/her understanding and acceptance.     Dental Advisory Given  Plan Discussed with: CRNA and Surgeon  Anesthesia Plan Comments:         Anesthesia Quick Evaluation

## 2021-12-14 NOTE — Anesthesia Procedure Notes (Signed)
Date/Time: 12/14/2021 12:47 PM Performed by: Demetrius Charity, CRNA Pre-anesthesia Checklist: Patient identified, Patient being monitored, Timeout performed, Emergency Drugs available and Suction available Patient Re-evaluated:Patient Re-evaluated prior to induction Oxygen Delivery Method: Circle system utilized Preoxygenation: Pre-oxygenation with 100% oxygen Ventilation: Mask ventilation without difficulty Airway Equipment and Method: Bite block Dental Injury: Teeth and Oropharynx as per pre-operative assessment

## 2021-12-14 NOTE — Anesthesia Postprocedure Evaluation (Signed)
Anesthesia Post Note  Patient: Melinda [name]  Procedure(s) Performed: ECT TX  Patient location during evaluation: PACU Anesthesia Type: General Level of consciousness: awake and oriented Pain management: pain level controlled Vital Signs Assessment: post-procedure vital signs reviewed and stable Respiratory status: spontaneous breathing and respiratory function stable Cardiovascular status: stable Anesthetic complications: no   No notable events documented.   Last Vitals:  Vitals:   12/14/21 1319 12/14/21 1417  BP: (!) 141/96 138/85  Pulse: (!) 101 100  Resp: 20 18  Temp: 37 C 36.9 C  SpO2:      Last Pain:  Vitals:   12/14/21 1417  TempSrc: Tympanic  PainSc: 0-No pain                 VAN STAVEREN,Kaedyn Belardo

## 2021-12-14 NOTE — Telephone Encounter (Signed)
Requested medications are due for refill today.  yes  Requested medications are on the active medications list.  yes  Last refill. 07/24/2021 #90/0 refills  Future visit scheduled.   yes  Notes to clinic.  Failed protocol d/t missing lab.    Requested Prescriptions  Pending Prescriptions Disp Refills   metFORMIN (GLUCOPHAGE-XR) 500 MG 24 hr tablet [Pharmacy Med Name: METFORMIN HYDROCHLORIDE ER 500 MG Tablet Extended Release 24 Hour] 90 tablet 0    Sig: TAKE 1 TABLET (500 MG TOTAL) DAILY WITH BREAKFAST.     Endocrinology:  Diabetes - Biguanides Failed - 12/14/2021  4:03 AM      Failed - HBA1C is between 0 and 7.9 and within 180 days    Hgb A1c MFr Bld  Date Value Ref Range Status  12/06/2021 8.2 (H) 4.8 - 5.6 % Final    Comment:             Prediabetes: 5.7 - 6.4          Diabetes: >6.4          Glycemic control for adults with diabetes: <7.0           Failed - B12 Level in normal range and within 720 days    No results found for: VITAMINB12        Passed - Cr in normal range and within 360 days    Creatinine  Date Value Ref Range Status  05/08/2013 0.77 0.60 - 1.30 mg/dL Final   Creatinine, Ser  Date Value Ref Range Status  12/06/2021 0.64 0.57 - 1.00 mg/dL Final          Passed - eGFR in normal range and within 360 days    EGFR (African American)  Date Value Ref Range Status  05/08/2013 >60  Final   GFR calc Af Amer  Date Value Ref Range Status  05/16/2020 90 >59 mL/min/1.73 Final    Comment:    **[facility] currently reports eGFR in compliance with the current**   recommendations of the [employer]. [facility] will   update reporting as new guidelines are published from the NKF-ASN   Task force.    EGFR (Non-African Amer.)  Date Value Ref Range Status  05/08/2013 >60  Final    Comment:    eGFR values <25m/min/1.73 m2 may be an indication of chronic kidney disease (CKD). Calculated eGFR is useful in patients with stable renal function. The  eGFR calculation will not be reliable in acutely ill patients when serum creatinine is changing rapidly. It is not useful in  patients on dialysis. The eGFR calculation may not be applicable to patients at the low and high extremes of body sizes, pregnant women, and vegetarians.    GFR calc non Af Amer  Date Value Ref Range Status  05/16/2020 78 >59 mL/min/1.73 Final   eGFR  Date Value Ref Range Status  12/06/2021 106 >59 mL/min/1.73 Final          Passed - Valid encounter within last 6 months    Recent Outpatient Visits           1 week ago [facility]   2 weeks ago Acute pain of right shoulder   [facility], [facility]   9 months ago Controlled type 2 diabetes mellitus without complication, without long-term current use of insulin ([facility]   [facility]  1 year ago Annual physical exam   [facility] Carles Collet M, [place]   1 year ago Controlled type 2 diabetes mellitus without complication, without long-term current use of insulin [facility])   [facility] [place], Wendee Beavers, [place]       Future Appointments             In 3 months Bacigalupo, Dionne Bucy, MD [facility]   In 5 months Bacigalupo, Dionne Bucy, MD [facility]            Passed - CBC within normal limits and completed in the last 12 months    WBC  Date Value Ref Range Status  03/15/2021 7.6 3.4 - 10.8 x10E3/uL Final  07/18/2017 7.9 3.6 - 11.0 K/uL Final   RBC  Date Value Ref Range Status  03/15/2021 4.79 3.77 - 5.28 x10E6/uL Final  07/18/2017 4.57 3.80 - 5.20 MIL/uL Final   Hemoglobin  Date Value Ref Range Status  03/15/2021 12.5 11.1 - 15.9 g/dL Final   Hematocrit  Date Value Ref Range Status  03/15/2021 39.1 34.0 - 46.6 % Final   MCHC  Date Value Ref Range Status  03/15/2021 32.0 31.5 - 35.7  g/dL Final  07/18/2017 33.6 32.0 - 36.0 g/dL Final   [facility]  Date Value Ref Range Status  03/15/2021 26.1 (L) 26.6 - 33.0 pg Final  07/18/2017 27.4 26.0 - 34.0 pg Final   MCV  Date Value Ref Range Status  03/15/2021 82 79 - 97 fL Final  05/08/2013 80 80 - 100 fL Final   No results found for: PLTCOUNTKUC, LABPLAT, POCPLA RDW  Date Value Ref Range Status  03/15/2021 12.0 11.7 - 15.4 % Final  05/08/2013 14.2 11.5 - 14.5 % Final

## 2021-12-14 NOTE — H&P (Signed)
Melinda [name] is an 51 y.o. female.   Chief Complaint: Patient has no new complaint HPI: History of recurrent depression on maintenance ECT  Past Medical History:  Diagnosis Date   Depression    Diabetes mellitus without complication ([place])    Diabetic peripheral neuropathy ([place]) 03/07/15   Diabetic peripheral neuropathy ([place]) 03/07/15   Diabetic peripheral neuropathy ([place]) 03/07/15   GERD (gastroesophageal reflux disease)    Hypercholesterolemia 03/07/15   Hypertension    Obesity 03/07/15   Personality disorder ([place]) 03/07/15   Sinus tachycardia 03/07/15   history of   Suicidal thoughts 03/07/15    Past Surgical History:  Procedure Laterality Date   COLONOSCOPY WITH PROPOFOL N/A 12/01/2018   Procedure: COLONOSCOPY WITH PROPOFOL;  Surgeon: Jonathon Bellows, MD;  Location: [facility] ENDOSCOPY;  Service: Gastroenterology;  Laterality: N/A;   electroconvulsion therapy  03/07/15    Family History  Problem Relation Age of Onset   Diabetes Mother    Hypertension Father    Breast cancer Neg Hx    Colon cancer Neg Hx    Social History:  reports that she has never smoked. She has never used smokeless tobacco. She reports that she does not drink alcohol and does not use drugs.  Allergies:  Allergies  Allergen Reactions   Prednisone     Increases blood sugar    (Not in a hospital admission)   Results for orders placed or performed during the hospital encounter of 12/14/21 (from the past 48 hour(s))  Glucose, capillary     Status: Abnormal   Collection Time: 12/14/21 11:36 AM  Result Value Ref Range   Glucose-Capillary 124 (H) 70 - 99 mg/dL    Comment: Glucose reference range applies only to samples taken after fasting for at least 8 hours.   Comment 1 Notify RN    Comment 2 Document in Chart   Glucose, capillary     Status: Abnormal   Collection Time: 12/14/21  1:09 PM  Result Value Ref Range   Glucose-Capillary 157 (H) 70 - 99 mg/dL    Comment: Glucose reference range applies only to  samples taken after fasting for at least 8 hours.   *Note: Due to a large number of results and/or encounters for the requested time period, some results have not been displayed. A complete set of results can be found in Results Review.   No results found.  Review of Systems  Constitutional: Negative.   HENT: Negative.    Eyes: Negative.   Respiratory: Negative.    Cardiovascular: Negative.   Gastrointestinal: Negative.   Musculoskeletal: Negative.   Skin: Negative.   Neurological: Negative.   Psychiatric/Behavioral: Negative.     Blood pressure 138/85, pulse 100, temperature 98.5 F (36.9 C), temperature source Tympanic, resp. rate 18, height 5\' 3"  (1.6 m), weight 111.6 kg, SpO2 94 %. Physical Exam Vitals and nursing note reviewed.  Constitutional:      Appearance: She is well-developed.  HENT:     Head: Normocephalic and atraumatic.  Eyes:     Conjunctiva/sclera: Conjunctivae normal.     Pupils: Pupils are equal, round, and reactive to light.  Cardiovascular:     Heart sounds: Normal heart sounds.  Pulmonary:     Effort: Pulmonary effort is normal.  Abdominal:     Palpations: Abdomen is soft.  Musculoskeletal:        General: Normal range of motion.     Cervical back: Normal range of motion.  Skin:    General: Skin  is warm and dry.  Neurological:     General: No focal deficit present.     Mental Status: She is alert.  Psychiatric:        Mood and Affect: Mood normal.     Assessment/Plan Follow-up 3 weeks  [name], MD 12/14/2021, 4:54 PM

## 2022-01-03 ENCOUNTER — Other Ambulatory Visit: Payer: Self-pay | Admitting: Psychiatry

## 2022-01-04 ENCOUNTER — Encounter: Payer: Self-pay | Admitting: Anesthesiology

## 2022-01-04 ENCOUNTER — Other Ambulatory Visit: Payer: Self-pay

## 2022-01-04 ENCOUNTER — Encounter
Admission: RE | Admit: 2022-01-04 | Discharge: 2022-01-04 | Disposition: A | Discharge status: Home / Self Care | Payer: Medicare HMO | Source: Ambulatory Visit | Attending: Psychiatry | Admitting: Psychiatry

## 2022-01-04 DIAGNOSIS — F32A Depression, unspecified: Secondary | ICD-10-CM | POA: Diagnosis not present

## 2022-01-04 DIAGNOSIS — F333 Major depressive disorder, recurrent, severe with psychotic symptoms: Secondary | ICD-10-CM

## 2022-01-04 DIAGNOSIS — E7800 Pure hypercholesterolemia, unspecified: Secondary | ICD-10-CM | POA: Diagnosis not present

## 2022-01-04 DIAGNOSIS — F332 Major depressive disorder, recurrent severe without psychotic features: Secondary | ICD-10-CM | POA: Diagnosis not present

## 2022-01-04 LAB — GLUCOSE, CAPILLARY
Glucose-Capillary: 177 mg/dL — ABNORMAL HIGH (ref 70–99)
Glucose-Capillary: 196 mg/dL — ABNORMAL HIGH (ref 70–99)

## 2022-01-04 MED ORDER — SUCCINYLCHOLINE CHLORIDE 200 MG/10ML IV SOSY
PREFILLED_SYRINGE | INTRAVENOUS | Status: DC | PRN
  Administered 2022-01-04: 100 mg via INTRAVENOUS

## 2022-01-04 MED ORDER — LABETALOL HCL 5 MG/ML IV SOLN
INTRAVENOUS | Status: DC | PRN
  Administered 2022-01-04: 10 mg via INTRAVENOUS

## 2022-01-04 MED ORDER — ESMOLOL HCL 100 MG/10ML IV SOLN
INTRAVENOUS | Status: DC | PRN
  Administered 2022-01-04: 30 mg via INTRAVENOUS

## 2022-01-04 MED ORDER — GLYCOPYRROLATE 0.2 MG/ML IJ SOLN: 0.4000 mg | Freq: Once | INTRAMUSCULAR | Status: AC

## 2022-01-04 MED ORDER — METHOHEXITAL SODIUM 100 MG/10ML IV SOSY
PREFILLED_SYRINGE | INTRAVENOUS | Status: DC | PRN
  Administered 2022-01-04: 70 mg via INTRAVENOUS

## 2022-01-04 MED ORDER — KETOROLAC TROMETHAMINE 30 MG/ML IJ SOLN
INTRAMUSCULAR | Status: AC
  Administered 2022-01-04: 30 mg via INTRAVENOUS
  Filled 2022-01-04: qty 1

## 2022-01-04 MED ORDER — SODIUM CHLORIDE 0.9 % IV SOLN: INTRAVENOUS | Status: DC | PRN

## 2022-01-04 MED ORDER — GLYCOPYRROLATE 0.2 MG/ML IJ SOLN
INTRAMUSCULAR | Status: AC
  Administered 2022-01-04: 0.4 mg via INTRAVENOUS
  Filled 2022-01-04: qty 2

## 2022-01-04 MED ORDER — KETOROLAC TROMETHAMINE 30 MG/ML IJ SOLN: 30.0000 mg | Freq: Once | INTRAMUSCULAR | Status: AC

## 2022-01-04 MED ORDER — SODIUM CHLORIDE 0.9 % IV SOLN
500.0000 mL | Freq: Once | INTRAVENOUS | Status: AC
  Administered 2022-01-04: 500 mL via INTRAVENOUS

## 2022-01-04 NOTE — H&P (Signed)
Melinda [name] is an 51 y.o. female.   Chief Complaint: No specific or new complaint HPI: History of recurrent depression with good response to maintenance ECT  Past Medical History:  Diagnosis Date   Depression    Diabetes mellitus without complication ([place])    Diabetic peripheral neuropathy ([place]) 03/07/15   Diabetic peripheral neuropathy ([place]) 03/07/15   Diabetic peripheral neuropathy ([place]) 03/07/15   GERD (gastroesophageal reflux disease)    Hypercholesterolemia 03/07/15   Hypertension    Obesity 03/07/15   Personality disorder ([place]) 03/07/15   Sinus tachycardia 03/07/15   history of   Suicidal thoughts 03/07/15    Past Surgical History:  Procedure Laterality Date   COLONOSCOPY WITH PROPOFOL N/A 12/01/2018   Procedure: COLONOSCOPY WITH PROPOFOL;  Surgeon: Jonathon Bellows, MD;  Location[facility] ENDOSCOPY;  Service: Gastroenterology;  Laterality: N/A;   electroconvulsion therapy  03/07/15    Family History  Problem Relation Age of Onset   Diabetes Mother    Hypertension Father    Breast cancer Neg Hx    Colon cancer Neg Hx    Social History:  reports that she has never smoked. She has never used smokeless tobacco. She reports that she does not drink alcohol and does not use drugs.  Allergies:  Allergies  Allergen Reactions   Prednisone     Increases blood sugar    (Not in a hospital admission)   Results for orders placed or performed during the hospital encounter of 01/04/22 (from the past 48 hour(s))  Glucose, capillary     Status: Abnormal   Collection Time: 01/04/22 11:27 AM  Result Value Ref Range   Glucose-Capillary 177 (H) 70 - 99 mg/dL    Comment: Glucose reference range applies only to samples taken after fasting for at least 8 hours.  Glucose, capillary     Status: Abnormal   Collection Time: 01/04/22 12:55 PM  Result Value Ref Range   Glucose-Capillary 196 (H) 70 - 99 mg/dL    Comment: Glucose reference range applies only to samples taken after fasting for at  least 8 hours.   *Note: Due to a large number of results and/or encounters for the requested time period, some results have not been displayed. A complete set of results can be found in Results Review.   No results found.  Review of Systems  Constitutional: Negative.   HENT: Negative.    Eyes: Negative.   Respiratory: Negative.    Cardiovascular: Negative.   Gastrointestinal: Negative.   Musculoskeletal: Negative.   Skin: Negative.   Neurological: Negative.   Psychiatric/Behavioral: Negative.     Blood pressure 120/65, pulse (!) 105, temperature 98.1 F (36.7 C), temperature source Tympanic, resp. rate 20, height 5\' 3"  (1.6 m), weight 111.6 kg, SpO2 94 %. Physical Exam Vitals reviewed.  Constitutional:      Appearance: She is well-developed.  HENT:     Head: Normocephalic and atraumatic.  Eyes:     Conjunctiva/sclera: Conjunctivae normal.     Pupils: Pupils are equal, round, and reactive to light.  Cardiovascular:     Heart sounds: Normal heart sounds.  Pulmonary:     Effort: Pulmonary effort is normal.  Abdominal:     Palpations: Abdomen is soft.  Musculoskeletal:        General: Normal range of motion.     Cervical back: Normal range of motion.  Skin:    General: Skin is warm and dry.  Neurological:     General: No focal deficit present.  Mental Status: She is alert.  Psychiatric:        Mood and Affect: Mood normal.     Assessment/Plan Continue maintenance treatment next follow-up 3 weeks  [name], MD 01/04/2022, 8:55 PM

## 2022-01-04 NOTE — Procedures (Signed)
ECT SERVICES Physicians Interval Evaluation & Treatment Note  Patient Identification: Melinda [name] MRN:  [id number] Date of Evaluation:  01/04/2022 TX #: [id number]  MADRS:   MMSE:   P.E. Findings:  No change physical exam  Psychiatric Interval Note:  Mood stable  Subjective:  Patient is a 51 y.o. female seen for evaluation for Electroconvulsive Therapy. No complaint  Treatment Summary:   []   Right Unilateral             [x]  Bilateral   % Energy : 1.0 ms 35%   Impedance: 1640 ohms  Seizure Energy Index: 8328 V squared  Postictal Suppression Index: 81%  Seizure Concordance Index: 96%  Medications  Pre Shock: Robinul 0.4 mg Toradol 30 mg labetalol 20 mg esmolol 20 mg Brevital 70 mg succinylcholine 100 mg  Post Shock:    Seizure Duration: 39 seconds EMG 74 seconds EEG   Comments: Follow-up 3 weeks  Lungs:  [x]   Clear to auscultation               []  Other:   Heart:    [x]   Regular rhythm             []  irregular rhythm    [x]   Previous H&P reviewed, patient examined and there are NO CHANGES                 []   Previous H&P reviewed, patient examined and there are changes noted.   Melinda Berthold, MD 2/24/20239:12 PM

## 2022-01-04 NOTE — Transfer of Care (Signed)
Immediate Anesthesia Transfer of Care Note  Patient: Melinda [name]  Procedure(s) Performed: ECT TX  Patient Location: PACU  Anesthesia Type:General  Level of Consciousness: drowsy  Airway & Oxygen Therapy: Patient Spontanous Breathing and Patient connected to face mask oxygen  Post-op Assessment: Report given to RN and Post -op Vital signs reviewed and stable  Post vital signs: Reviewed and stable  Last Vitals:  Vitals Value Taken Time  BP 146/97 01/04/22 1248  Temp    Pulse 114 01/04/22 1249  Resp 19 01/04/22 1249  SpO2 99 % 01/04/22 1249  Vitals shown include unvalidated device data.  Last Pain:  Vitals:   01/04/22 1102  TempSrc: Tympanic  PainSc: 0-No pain         Complications: No notable events documented.

## 2022-01-04 NOTE — Anesthesia Preprocedure Evaluation (Signed)
Anesthesia Evaluation  Patient identified by MRN, date of birth, ID band Patient awake    Reviewed: Allergy & Precautions, H&P , NPO status , Patient's Chart, lab work & pertinent test results  History of Anesthesia Complications Negative for: history of anesthetic complications  Airway Mallampati: II  TM Distance: >3 FB Neck ROM: full    Dental  (+) Poor Dentition, Chipped   Pulmonary sleep apnea , neg COPD,    Pulmonary exam normal breath sounds clear to auscultation       Cardiovascular hypertension, Pt. on medications (-) CAD and (-) Past MI negative cardio ROS Normal cardiovascular exam Rhythm:regular Rate:Normal     Neuro/Psych PSYCHIATRIC DISORDERS Depression Bipolar Disorder  Neuromuscular disease negative neurological ROS     GI/Hepatic Neg liver ROS, GERD  Controlled,  Endo/Other  diabetes, Type 2, Oral Hypoglycemic Agents  Renal/GU negative Renal ROS  negative genitourinary   Musculoskeletal   Abdominal (+) + obese,   Peds  Hematology negative hematology ROS (+)   Anesthesia Other Findings Past Medical History: No date: Depression No date: Diabetes mellitus without complication (HCC) 03/07/15: Diabetic peripheral neuropathy (HCC) 03/07/15: Diabetic peripheral neuropathy (HCC) 03/07/15: Diabetic peripheral neuropathy (HCC) No date: GERD (gastroesophageal reflux disease) 03/07/15: Hypercholesterolemia No date: Hypertension 03/07/15: Obesity 03/07/15: Personality disorder 03/07/15: Sinus tachycardia (HCC)     Comment: history of 03/07/15: Suicidal thoughts Past Surgical History: 03/07/15: electroconvulsion therapy BMI    Body Mass Index:  36.44 kg/m     Reproductive/Obstetrics                             Anesthesia Physical  Anesthesia Plan  ASA: III  Anesthesia Plan: General   Post-op Pain Management:    Induction: Intravenous  PONV Risk Score and Plan: 2 and  Ondansetron  Airway Management Planned: Mask  Additional Equipment:   Intra-op Plan:   Post-operative Plan:   Informed Consent: I have reviewed the patients History and Physical, chart, labs and discussed the procedure including the risks, benefits and alternatives for the proposed anesthesia with the patient or authorized representative who has indicated his/her understanding and acceptance.     Dental Advisory Given  Plan Discussed with: CRNA and Anesthesiologist  Anesthesia Plan Comments: (Patient consented for risks of anesthesia including but not limited to:  - adverse reactions to medications - risk of airway placement if required - damage to eyes, teeth, lips or other oral mucosa - nerve damage due to positioning  - sore throat or hoarseness - Damage to heart, brain, nerves, lungs, other parts of body or loss of life  Patient voiced understanding.)        Anesthesia Quick Evaluation  

## 2022-01-04 NOTE — Anesthesia Procedure Notes (Signed)
Procedure Name: General with mask airway Date/Time: 01/04/2022 12:50 PM Performed by: Lily Peer, Capone Schwinn, CRNA Pre-anesthesia Checklist: Patient identified, Emergency Drugs available, Suction available, Patient being monitored and Timeout performed Patient Re-evaluated:Patient Re-evaluated prior to induction Oxygen Delivery Method: Circle system utilized Preoxygenation: Pre-oxygenation with 100% oxygen Induction Type: IV induction Ventilation: Mask ventilation throughout procedure

## 2022-01-07 NOTE — Anesthesia Postprocedure Evaluation (Signed)
Anesthesia Post Note  Patient: Melinda [name]  Procedure(s) Performed: ECT TX  Patient location during evaluation: PACU Anesthesia Type: General Level of consciousness: awake and alert Pain management: pain level controlled Vital Signs Assessment: post-procedure vital signs reviewed and stable Respiratory status: spontaneous breathing, nonlabored ventilation, respiratory function stable and patient connected to nasal cannula oxygen Cardiovascular status: blood pressure returned to baseline and stable Postop Assessment: no apparent nausea or vomiting Anesthetic complications: no   No notable events documented.   Last Vitals:  Vitals:   01/04/22 1315 01/04/22 1529  BP: 122/68 120/65  Pulse: (!) 108 (!) 105  Resp: (!) 23 20  Temp:  36.7 C  SpO2: 94%     Last Pain:  Vitals:   01/04/22 1529  TempSrc: Tympanic  PainSc: 0-No pain                 Precious Haws Coleta Grosshans

## 2022-01-25 ENCOUNTER — Encounter
Admission: RE | Admit: 2022-01-25 | Discharge: 2022-01-25 | Disposition: A | Discharge status: Home / Self Care | Payer: Medicare HMO | Source: Ambulatory Visit | Attending: Psychiatry | Admitting: Psychiatry

## 2022-01-25 ENCOUNTER — Ambulatory Visit: Payer: Self-pay | Admitting: Anesthesiology

## 2022-01-25 ENCOUNTER — Other Ambulatory Visit: Payer: Self-pay | Admitting: Psychiatry

## 2022-01-25 ENCOUNTER — Encounter: Payer: Self-pay | Admitting: Anesthesiology

## 2022-01-25 DIAGNOSIS — F332 Major depressive disorder, recurrent severe without psychotic features: Secondary | ICD-10-CM | POA: Insufficient documentation

## 2022-01-25 DIAGNOSIS — N898 Other specified noninflammatory disorders of vagina: Secondary | ICD-10-CM | POA: Insufficient documentation

## 2022-01-25 DIAGNOSIS — Z0000 Encounter for general adult medical examination without abnormal findings: Secondary | ICD-10-CM | POA: Insufficient documentation

## 2022-01-25 DIAGNOSIS — E785 Hyperlipidemia, unspecified: Secondary | ICD-10-CM | POA: Diagnosis not present

## 2022-01-25 DIAGNOSIS — Z23 Encounter for immunization: Secondary | ICD-10-CM | POA: Diagnosis not present

## 2022-01-25 DIAGNOSIS — Z6841 Body Mass Index (BMI) 40.0 and over, adult: Secondary | ICD-10-CM | POA: Insufficient documentation

## 2022-01-25 DIAGNOSIS — Z1231 Encounter for screening mammogram for malignant neoplasm of breast: Secondary | ICD-10-CM | POA: Insufficient documentation

## 2022-01-25 DIAGNOSIS — E1159 Type 2 diabetes mellitus with other circulatory complications: Secondary | ICD-10-CM | POA: Diagnosis not present

## 2022-01-25 DIAGNOSIS — F339 Major depressive disorder, recurrent, unspecified: Secondary | ICD-10-CM | POA: Diagnosis not present

## 2022-01-25 DIAGNOSIS — E038 Other specified hypothyroidism: Secondary | ICD-10-CM | POA: Insufficient documentation

## 2022-01-25 DIAGNOSIS — E1169 Type 2 diabetes mellitus with other specified complication: Secondary | ICD-10-CM | POA: Insufficient documentation

## 2022-01-25 DIAGNOSIS — I152 Hypertension secondary to endocrine disorders: Secondary | ICD-10-CM | POA: Insufficient documentation

## 2022-01-25 DIAGNOSIS — E7800 Pure hypercholesterolemia, unspecified: Secondary | ICD-10-CM | POA: Diagnosis not present

## 2022-01-25 LAB — GLUCOSE, CAPILLARY
Glucose-Capillary: 172 mg/dL — ABNORMAL HIGH (ref 70–99)
Glucose-Capillary: 182 mg/dL — ABNORMAL HIGH (ref 70–99)

## 2022-01-25 MED ORDER — SUCCINYLCHOLINE CHLORIDE 200 MG/10ML IV SOSY
PREFILLED_SYRINGE | INTRAVENOUS | Status: DC | PRN
  Administered 2022-01-25: 100 mg via INTRAVENOUS

## 2022-01-25 MED ORDER — SODIUM CHLORIDE 0.9 % IV SOLN
500.0000 mL | Freq: Once | INTRAVENOUS | Status: AC
  Administered 2022-01-25: 500 mL via INTRAVENOUS

## 2022-01-25 MED ORDER — METHOHEXITAL SODIUM 0.5 G IJ SOLR
INTRAMUSCULAR | Status: AC
  Filled 2022-01-25: qty 500

## 2022-01-25 MED ORDER — METHOHEXITAL SODIUM 100 MG/10ML IV SOSY
PREFILLED_SYRINGE | INTRAVENOUS | Status: DC | PRN
Start: 2022-01-25 — End: 2022-01-25
  Administered 2022-01-25: 70 mg via INTRAVENOUS

## 2022-01-25 MED ORDER — GLYCOPYRROLATE 0.2 MG/ML IJ SOLN: 0.4000 mg | Freq: Once | INTRAMUSCULAR | Status: AC

## 2022-01-25 MED ORDER — ESMOLOL HCL 100 MG/10ML IV SOLN
INTRAVENOUS | Status: DC | PRN
  Administered 2022-01-25: 30 mg via INTRAVENOUS

## 2022-01-25 MED ORDER — KETOROLAC TROMETHAMINE 30 MG/ML IJ SOLN: 30.0000 mg | Freq: Once | INTRAMUSCULAR | Status: AC

## 2022-01-25 MED ORDER — KETOROLAC TROMETHAMINE 30 MG/ML IJ SOLN
INTRAMUSCULAR | Status: AC
  Administered 2022-01-25: 30 mg via INTRAVENOUS
  Filled 2022-01-25: qty 1

## 2022-01-25 MED ORDER — SODIUM CHLORIDE 0.9 % IV SOLN: INTRAVENOUS | Status: DC | PRN

## 2022-01-25 MED ORDER — LABETALOL HCL 5 MG/ML IV SOLN
INTRAVENOUS | Status: DC | PRN
Start: 2022-01-25 — End: 2022-01-25
  Administered 2022-01-25: 10 mg via INTRAVENOUS

## 2022-01-25 MED ORDER — GLYCOPYRROLATE 0.2 MG/ML IJ SOLN
INTRAMUSCULAR | Status: AC
  Administered 2022-01-25: 0.4 mg via INTRAVENOUS
  Filled 2022-01-25: qty 2

## 2022-01-25 NOTE — Procedures (Signed)
ECT SERVICES ?Physician?s Interval Evaluation & Treatment Note ? ?Patient Identification: Melinda [name] ?MRN:  [id number] ?Date of Evaluation:  01/25/2022 ?[place] #: [id number] ? ?MADRS:  ? ?MMSE:  ? ?P.E. Findings: ? ?No change to physical exam ? ?Psychiatric Interval Note: ? ?Actually feels kind of upbeat about her grandchild ? ?Subjective: ? ?Patient is a 51 y.o. female seen for evaluation for Electroconvulsive Therapy. ?No complaint ? ?Treatment Summary: ? ? '[]'$   Right Unilateral             '[x]'$  Bilateral ?  ?% Energy : 1.0 ms 35% ? ? Impedance: 1110 ohms ? ?Seizure Energy Index: 2952 ?V squared ? ?Postictal Suppression Index: 85% ? ?Seizure Concordance Index: 93% ? ?Medications ? ?Pre Shock: Robinul 0.4 mg Toradol 30 mg labetalol 20 mg esmolol 20 mg Brevital 70 mg succinylcholine 100 mg ? ?Post Shock:   ? ?Seizure Duration: 31 seconds EMG 78 seconds EEG ? ? ?Comments: ?Follow-up 3 weeks ? ?Lungs: ? ?'[x]'$   Clear to auscultation              ? ?'[]'$  Other:  ? ?Heart: ?  ? '[x]'$   Regular rhythm             '[]'$  irregular rhythm ? ? ? '[x]'$   Previous H&P reviewed, patient examined and there are NO CHANGES              ?  ? '[]'$   Previous H&P reviewed, patient examined and there are changes noted. ? ? ?[name], MD ?3/17/20237:32 PM ? ? ? ?

## 2022-01-25 NOTE — Transfer of Care (Signed)
Immediate Anesthesia Transfer of Care Note ? ?Patient: Melinda [name] ? ?Procedure(s) Performed: ECT TX ? ?Patient Location: PACU ? ?Anesthesia Type:General ? ?Level of Consciousness: sedated ? ?Airway & Oxygen Therapy: Patient Spontanous Breathing and Patient connected to face mask oxygen ? ?Post-op Assessment: Report given to RN and Post -op Vital signs reviewed and stable ? ?Post vital signs: Reviewed and stable ? ?Last Vitals:  ?Vitals Value Taken Time  ?BP 116/89 01/25/22 1316  ?Temp 36.8 ?C 01/25/22 1316  ?Pulse 108 01/25/22 1316  ?Resp 24 01/25/22 1316  ?SpO2 99 % 01/25/22 1316  ?Vitals shown include unvalidated device data. ? ?Last Pain:  ?Vitals:  ? 01/25/22 1316  ?TempSrc:   ?PainSc: Asleep  ?   ? ?  ? ?Complications: No notable events documented. ?

## 2022-01-25 NOTE — H&P (Signed)
Melinda [name] is an 51 y.o. female.   ?Chief Complaint: Not only does she not have a chief complaint, for the first time ever she actually indicates that she is sort of happy.  Her grandson was born and is healthy! ?HPI: History of chronic recurrent depression ? ?Past Medical History:  ?Diagnosis Date  ? Depression   ? Diabetes mellitus without complication ([place])   ? Diabetic peripheral neuropathy ([place]) 03/07/15  ? Diabetic peripheral neuropathy ([place]) 03/07/15  ? Diabetic peripheral neuropathy ([place]) 03/07/15  ? GERD (gastroesophageal reflux disease)   ? Hypercholesterolemia 03/07/15  ? Hypertension   ? Obesity 03/07/15  ? Personality disorder ([place]) 03/07/15  ? Sinus tachycardia 03/07/15  ? history of  ? Suicidal thoughts 03/07/15  ? ? ?Past Surgical History:  ?Procedure Laterality Date  ? COLONOSCOPY WITH PROPOFOL N/A 12/01/2018  ? Procedure: COLONOSCOPY WITH PROPOFOL;  Surgeon: Jonathon Bellows, MD;  Location: [facility] ENDOSCOPY;  Service: Gastroenterology;  Laterality: N/A;  ? electroconvulsion therapy  03/07/15  ? ? ?Family History  ?Problem Relation Age of Onset  ? Diabetes Mother   ? Hypertension Father   ? Breast cancer Neg Hx   ? Colon cancer Neg Hx   ? ?Social History:  reports that she has never smoked. She has never used smokeless tobacco. She reports that she does not drink alcohol and does not use drugs. ? ?Allergies:  ?Allergies  ?Allergen Reactions  ? Prednisone   ?  Increases blood sugar  ? ? ?(Not in a hospital admission) ? ? ?Results for orders placed or performed during the hospital encounter of 01/25/22 (from the past 48 hour(s))  ?Glucose, capillary     Status: Abnormal  ? Collection Time: 01/25/22 12:19 PM  ?Result Value Ref Range  ? Glucose-Capillary 172 (H) 70 - 99 mg/dL  ?  Comment: Glucose reference range applies only to samples taken after fasting for at least 8 hours.  ?Glucose, capillary     Status: Abnormal  ? Collection Time: 01/25/22  1:18 PM  ?Result Value Ref Range  ? Glucose-Capillary 182 (H) 70 -  99 mg/dL  ?  Comment: Glucose reference range applies only to samples taken after fasting for at least 8 hours.  ? ?*Note: Due to a large number of results and/or encounters for the requested time period, some results have not been displayed. A complete set of results can be found in Results Review.  ? ?No results found. ? ?Review of Systems  ?Constitutional: Negative.   ?HENT: Negative.    ?Eyes: Negative.   ?Respiratory: Negative.    ?Cardiovascular: Negative.   ?Gastrointestinal: Negative.   ?Musculoskeletal: Negative.   ?Skin: Negative.   ?Neurological: Negative.   ?Psychiatric/Behavioral: Negative.    ?All other systems reviewed and are negative. ? ?Blood pressure 112/80, pulse 100, temperature 97.9 ?F (36.6 ?C), temperature source Temporal, resp. rate 18, height '5\' 3"'$  (1.6 m), weight 111.6 kg, SpO2 100 %. ?Physical Exam ?Vitals and nursing note reviewed.  ?Constitutional:   ?   Appearance: She is well-developed.  ?HENT:  ?   Head: Normocephalic and atraumatic.  ?Eyes:  ?   Conjunctiva/sclera: Conjunctivae normal.  ?   Pupils: Pupils are equal, round, and reactive to light.  ?Cardiovascular:  ?   Heart sounds: Normal heart sounds.  ?Pulmonary:  ?   Effort: Pulmonary effort is normal.  ?Abdominal:  ?   Palpations: Abdomen is soft.  ?Musculoskeletal:     ?   General: Normal range of motion.  ?  Cervical back: Normal range of motion.  ?Skin: ?   General: Skin is warm and dry.  ?Neurological:  ?   General: No focal deficit present.  ?   Mental Status: She is alert.  ?Psychiatric:     ?   Mood and Affect: Mood normal.  ?  ? ?Assessment/Plan ?Continue every 3-week ECT treatment ? ?[name], MD ?01/25/2022, 7:31 PM ? ? ? ?

## 2022-01-25 NOTE — Anesthesia Preprocedure Evaluation (Signed)
Anesthesia Evaluation  ?Patient identified by MRN, date of birth, ID band ?Patient awake ? ? ? ?Reviewed: ?Allergy & Precautions, H&P , NPO status , Patient's Chart, lab work & pertinent test results ? ?History of Anesthesia Complications ?Negative for: history of anesthetic complications ? ?Airway ?Mallampati: II ? ?TM Distance: >3 FB ?Neck ROM: full ? ? ? Dental ? ?(+) Poor Dentition, Chipped ?  ?Pulmonary ?sleep apnea , neg COPD,  ?  ?Pulmonary exam normal ?breath sounds clear to auscultation ? ? ? ? ? ? Cardiovascular ?hypertension, Pt. on medications ?+CHF (Cardiomyopathy)  ?(-) CAD and (-) Past MI Normal cardiovascular exam ?Rhythm:regular Rate:Normal ? ? ?  ?Neuro/Psych ?PSYCHIATRIC DISORDERS Depression Bipolar Disorder  Neuromuscular disease   ? GI/Hepatic ?Neg liver ROS, GERD  Controlled,  ?Endo/Other  ?diabetes, Type 2, Oral Hypoglycemic Agents ? Renal/GU ?negative Renal ROS  ?negative genitourinary ?  ?Musculoskeletal ? ? Abdominal ?(+) + obese,   ?Peds ? Hematology ?negative hematology ROS ?(+)   ?Anesthesia Other Findings ?Past Medical History: ?No date: Depression ?No date: Diabetes mellitus without complication ([place]) ?4/00/86: Diabetic peripheral neuropathy ([place]) ?03/07/15: Diabetic peripheral neuropathy ([place]) ?03/07/15: Diabetic peripheral neuropathy ([place]) ?No date: GERD (gastroesophageal reflux disease) ?03/07/15: Hypercholesterolemia ?No date: Hypertension ?03/07/15: Obesity ?03/07/15: Personality disorder ?03/07/15: Sinus tachycardia ([place]) ?    Comment: history of ?03/07/15: Suicidal thoughts ?Past Surgical History: ?03/07/15: electroconvulsion therapy ?BMI   ? Body Mass Index:  36.44 kg/m?  ?  ? Reproductive/Obstetrics ? ?  ? ? ? ? ? ? ? ? ? ? ? ? ? ?  ?  ? ? ? ? ? ? ? ? ?Anesthesia Physical ? ?Anesthesia Plan ? ?ASA: III ? ?Anesthesia Plan: General  ? ?Post-op Pain Management:   ? ?Induction: Intravenous ? ?PONV Risk Score and Plan: 2 and Treatment may vary due to age  or medical condition and TIVA ? ?Airway Management Planned: Mask ? ?Additional Equipment:  ? ?Intra-op Plan:  ? ?Post-operative Plan:  ? ?Informed Consent: I have reviewed the patients History and Physical, chart, labs and discussed the procedure including the risks, benefits and alternatives for the proposed anesthesia with the patient or authorized representative who has indicated his/her understanding and acceptance.  ? ? ? ?Dental Advisory Given ? ?Plan Discussed with: CRNA and Anesthesiologist ? ?Anesthesia Plan Comments: (Patient consented for risks of anesthesia including but not limited to:  ?- adverse reactions to medications ?- risk of airway placement if required ?- damage to eyes, teeth, lips or other oral mucosa ?- nerve damage due to positioning  ?- sore throat or hoarseness ?- Damage to heart, brain, nerves, lungs, other parts of body or loss of life ? ?Patient voiced understanding.)  ? ? ? ? ? ? ?Anesthesia Quick Evaluation ? ?

## 2022-01-29 NOTE — Anesthesia Postprocedure Evaluation (Signed)
Anesthesia Post Note ? ?Patient: Melinda [name] ? ?Procedure(s) Performed: ECT TX ? ?Patient location during evaluation: PACU ?Anesthesia Type: General ?Level of consciousness: awake and alert ?Pain management: pain level controlled ?Vital Signs Assessment: post-procedure vital signs reviewed and stable ?Respiratory status: spontaneous breathing, nonlabored ventilation and respiratory function stable ?Cardiovascular status: blood pressure returned to baseline and stable ?Postop Assessment: no apparent nausea or vomiting ?Anesthetic complications: no ? ? ?No notable events documented. ? ? ?Last Vitals:  ?Vitals:  ? 01/25/22 1324 01/25/22 1407  ?BP: 116/84 112/80  ?Pulse: (!) 104 100  ?Resp: 18 18  ?Temp: 36.6 ?C 36.6 ?C  ?SpO2:    ?  ?Last Pain:  ?Vitals:  ? 01/25/22 1407  ?TempSrc: Temporal  ?PainSc: 0-No pain  ? ? ?  ?  ?  ?  ?  ?  ? ?[place] Ouch ? ? ? ? ?

## 2022-02-01 ENCOUNTER — Other Ambulatory Visit: Payer: Self-pay

## 2022-02-01 ENCOUNTER — Ambulatory Visit
Admission: RE | Admit: 2022-02-01 | Discharge: 2022-02-01 | Disposition: A | Discharge status: Home / Self Care | Payer: Medicare HMO | Source: Ambulatory Visit | Attending: Family Medicine | Admitting: Family Medicine

## 2022-02-01 DIAGNOSIS — Z1231 Encounter for screening mammogram for malignant neoplasm of breast: Secondary | ICD-10-CM | POA: Diagnosis not present

## 2022-02-13 ENCOUNTER — Other Ambulatory Visit: Payer: Self-pay | Admitting: Family Medicine

## 2022-02-13 DIAGNOSIS — I10 Essential (primary) hypertension: Secondary | ICD-10-CM

## 2022-02-15 ENCOUNTER — Encounter
Admission: RE | Admit: 2022-02-15 | Discharge: 2022-02-15 | Disposition: A | Discharge status: Home / Self Care | Payer: Medicare HMO | Source: Ambulatory Visit | Attending: Psychiatry | Admitting: Psychiatry

## 2022-02-15 ENCOUNTER — Encounter: Payer: Self-pay | Admitting: Registered Nurse

## 2022-02-15 ENCOUNTER — Ambulatory Visit: Payer: Self-pay | Admitting: Registered Nurse

## 2022-02-15 ENCOUNTER — Other Ambulatory Visit: Payer: Self-pay | Admitting: Psychiatry

## 2022-02-15 DIAGNOSIS — Z7984 Long term (current) use of oral hypoglycemic drugs: Secondary | ICD-10-CM | POA: Insufficient documentation

## 2022-02-15 DIAGNOSIS — Z79899 Other long term (current) drug therapy: Secondary | ICD-10-CM | POA: Insufficient documentation

## 2022-02-15 DIAGNOSIS — I10 Essential (primary) hypertension: Secondary | ICD-10-CM | POA: Diagnosis not present

## 2022-02-15 DIAGNOSIS — G4730 Sleep apnea, unspecified: Secondary | ICD-10-CM | POA: Diagnosis not present

## 2022-02-15 DIAGNOSIS — F332 Major depressive disorder, recurrent severe without psychotic features: Secondary | ICD-10-CM | POA: Insufficient documentation

## 2022-02-15 DIAGNOSIS — E669 Obesity, unspecified: Secondary | ICD-10-CM | POA: Insufficient documentation

## 2022-02-15 DIAGNOSIS — K219 Gastro-esophageal reflux disease without esophagitis: Secondary | ICD-10-CM | POA: Diagnosis not present

## 2022-02-15 DIAGNOSIS — E119 Type 2 diabetes mellitus without complications: Secondary | ICD-10-CM | POA: Insufficient documentation

## 2022-02-15 DIAGNOSIS — F32A Depression, unspecified: Secondary | ICD-10-CM | POA: Diagnosis present

## 2022-02-15 LAB — GLUCOSE, CAPILLARY: Glucose-Capillary: 125 mg/dL — ABNORMAL HIGH (ref 70–99)

## 2022-02-15 MED ORDER — ESMOLOL HCL 100 MG/10ML IV SOLN
INTRAVENOUS | Status: DC | PRN
  Administered 2022-02-15: 30 mg via INTRAVENOUS
  Administered 2022-02-15: 20 mg via INTRAVENOUS

## 2022-02-15 MED ORDER — GLYCOPYRROLATE 0.2 MG/ML IJ SOLN: 0.4000 mg | Freq: Once | INTRAMUSCULAR | Status: AC

## 2022-02-15 MED ORDER — HYDRALAZINE HCL 20 MG/ML IJ SOLN
INTRAMUSCULAR | Status: AC
  Filled 2022-02-15: qty 1

## 2022-02-15 MED ORDER — SUCCINYLCHOLINE CHLORIDE 200 MG/10ML IV SOSY
PREFILLED_SYRINGE | INTRAVENOUS | Status: AC
  Filled 2022-02-15: qty 10

## 2022-02-15 MED ORDER — METHOHEXITAL SODIUM 100 MG/10ML IV SOSY
PREFILLED_SYRINGE | INTRAVENOUS | Status: DC | PRN
  Administered 2022-02-15: 70 mg via INTRAVENOUS

## 2022-02-15 MED ORDER — SUCCINYLCHOLINE CHLORIDE 200 MG/10ML IV SOSY
PREFILLED_SYRINGE | INTRAVENOUS | Status: DC | PRN
  Administered 2022-02-15: 100 mg via INTRAVENOUS

## 2022-02-15 MED ORDER — LABETALOL HCL 5 MG/ML IV SOLN
INTRAVENOUS | Status: AC
  Filled 2022-02-15: qty 4

## 2022-02-15 MED ORDER — GLYCOPYRROLATE 0.2 MG/ML IJ SOLN
INTRAMUSCULAR | Status: AC
  Administered 2022-02-15: 0.4 mg via INTRAVENOUS
  Filled 2022-02-15: qty 2

## 2022-02-15 MED ORDER — ESMOLOL HCL 100 MG/10ML IV SOLN
INTRAVENOUS | Status: AC
  Filled 2022-02-15: qty 10

## 2022-02-15 MED ORDER — LABETALOL HCL 5 MG/ML IV SOLN
INTRAVENOUS | Status: DC | PRN
  Administered 2022-02-15 (×2): 10 mg via INTRAVENOUS

## 2022-02-15 MED ORDER — SODIUM CHLORIDE 0.9 % IV SOLN
500.0000 mL | Freq: Once | INTRAVENOUS | Status: AC
  Administered 2022-02-15: 500 mL via INTRAVENOUS

## 2022-02-15 MED ORDER — ONDANSETRON HCL 4 MG/2ML IJ SOLN: 4.0000 mg | Freq: Once | INTRAMUSCULAR | Status: DC | PRN

## 2022-02-15 MED ORDER — KETOROLAC TROMETHAMINE 30 MG/ML IJ SOLN
INTRAMUSCULAR | Status: AC
  Administered 2022-02-15: 30 mg via INTRAVENOUS
  Filled 2022-02-15: qty 1

## 2022-02-15 MED ORDER — SODIUM CHLORIDE 0.9 % IV SOLN: INTRAVENOUS | Status: DC | PRN

## 2022-02-15 MED ORDER — METHOHEXITAL SODIUM 0.5 G IJ SOLR
INTRAMUSCULAR | Status: AC
  Filled 2022-02-15: qty 500

## 2022-02-15 MED ORDER — KETOROLAC TROMETHAMINE 30 MG/ML IJ SOLN: 30.0000 mg | Freq: Once | INTRAMUSCULAR | Status: AC

## 2022-02-15 NOTE — Procedures (Signed)
ECT SERVICES ?Physician?s Interval Evaluation & Treatment Note ? ?Patient Identification: Melinda [name] ?MRN:  [id number] ?Date of Evaluation:  02/15/2022 ?[place] #: [id number] ? ?MADRS:  ? ?MMSE:  ? ?P.E. Findings: ? ?No change to physical exam ? ?Psychiatric Interval Note: ? ?Feeling good in fact feeling pretty upbeat because of their grandchild ? ?Subjective: ? ?Patient is a 51 y.o. female seen for evaluation for Electroconvulsive Therapy. ?No complaint ? ?Treatment Summary: ? ? '[]'$   Right Unilateral             '[x]'$  Bilateral ?  ?% Energy : 1.0 ms 35% ? ? Impedance: 1230 ohms ? ?Seizure Energy Index: 0623 ?V squared ? ?Postictal Suppression Index: 69% ? ?Seizure Concordance Index: 95% ? ?Medications ? ?Pre Shock: Robinul 0.4 mg Toradol 30 mg Brevital 70 mg succinylcholine 100 mg labetalol 10 mg esmolol 30 mg ? ?Post Shock:   ? ?Seizure Duration: 41 seconds EMG at 104 seconds EEG ? ? ?Comments: ?Tolerated well.  Continue maintenance course next treatment in 3 weeks ? ?Lungs: ? ?'[x]'$   Clear to auscultation              ? ?'[]'$  Other:  ? ?Heart: ?  ? '[x]'$   Regular rhythm             '[]'$  irregular rhythm ? ? ? '[x]'$   Previous H&P reviewed, patient examined and there are NO CHANGES              ?  ? '[]'$   Previous H&P reviewed, patient examined and there are changes noted. ? ? ?[name], MD ?4/7/20234:33 PM ? ? ? ?

## 2022-02-15 NOTE — Anesthesia Preprocedure Evaluation (Signed)
Anesthesia Evaluation  ?Patient identified by MRN, date of birth, ID band ?Patient awake ? ? ? ?Reviewed: ?Allergy & Precautions, H&P , NPO status , Patient's Chart, lab work & pertinent test results ? ?History of Anesthesia Complications ?Negative for: history of anesthetic complications ? ?Airway ?Mallampati: III ? ?TM Distance: >3 FB ?Neck ROM: full ? ? ? Dental ? ?(+) Poor Dentition, Chipped ?  ?Pulmonary ?sleep apnea , neg COPD,  ?  ?Pulmonary exam normal ?breath sounds clear to auscultation ? ? ? ? ? ? Cardiovascular ?hypertension, Pt. on medications ?+CHF (Cardiomyopathy)  ?(-) CAD and (-) Past MI Normal cardiovascular exam ?Rhythm:regular Rate:Normal ? ? ?  ?Neuro/Psych ?PSYCHIATRIC DISORDERS Depression Bipolar Disorder  Neuromuscular disease   ? GI/Hepatic ?Neg liver ROS, GERD  Controlled,  ?Endo/Other  ?diabetes, Type 2, Oral Hypoglycemic AgentsMorbid obesity ? Renal/GU ?negative Renal ROS  ?negative genitourinary ?  ?Musculoskeletal ? ? Abdominal ?(+) + obese,   ?Peds ? Hematology ?negative hematology ROS ?(+)   ?Anesthesia Other Findings ?Past Medical History: ?No date: Depression ?No date: Diabetes mellitus without complication ([place]) ?1/61/09: Diabetic peripheral neuropathy ([place]) ?03/07/15: Diabetic peripheral neuropathy ([place]) ?03/07/15: Diabetic peripheral neuropathy ([place]) ?No date: GERD (gastroesophageal reflux disease) ?03/07/15: Hypercholesterolemia ?No date: Hypertension ?03/07/15: Obesity ?03/07/15: Personality disorder ?03/07/15: Sinus tachycardia ([place]) ?    Comment: history of ?03/07/15: Suicidal thoughts ?Past Surgical History: ?03/07/15: electroconvulsion therapy ?BMI   ? Body Mass Index:  36.44 kg/m?  ?  ? Reproductive/Obstetrics ? ?  ? ? ? ? ? ? ? ? ? ? ? ? ? ?  ?  ? ? ? ? ? ? ? ? ?Anesthesia Physical ? ?Anesthesia Plan ? ?ASA: 3 ? ?Anesthesia Plan: General  ? ?Post-op Pain Management: Minimal or no pain anticipated  ? ?Induction: Intravenous ? ?PONV Risk Score and  Plan: 3 and Treatment may vary due to age or medical condition, TIVA and Ondansetron ? ?Airway Management Planned: Mask ? ?Additional Equipment: None ? ?Intra-op Plan:  ? ?Post-operative Plan:  ? ?Informed Consent: I have reviewed the patients History and Physical, chart, labs and discussed the procedure including the risks, benefits and alternatives for the proposed anesthesia with the patient or authorized representative who has indicated his/her understanding and acceptance.  ? ? ? ?Dental Advisory Given ? ?Plan Discussed with: CRNA and Anesthesiologist ? ?Anesthesia Plan Comments: (Discussed risks of anesthesia with patient, including PONV, muscle aches. Rare risks discussed as well, such as cardiorespiratory sequelae, need for airway intervention and its associated risks including lip/dental/eye damage and sore throat, and allergic reactions. Discussed the role of CRNA in patient's perioperative care. Patient understands.)  ? ? ? ? ? ? ?Anesthesia Quick Evaluation ? ?

## 2022-02-15 NOTE — H&P (Signed)
Melinda [name] is an 51 y.o. female.   ?Chief Complaint: none ?HPI: chronic depression ? ?Past Medical History:  ?Diagnosis Date  ? Depression   ? Diabetes mellitus without complication ([place])   ? Diabetic peripheral neuropathy ([place]) 03/07/15  ? Diabetic peripheral neuropathy ([place]) 03/07/15  ? Diabetic peripheral neuropathy ([place]) 03/07/15  ? GERD (gastroesophageal reflux disease)   ? Hypercholesterolemia 03/07/15  ? Hypertension   ? Obesity 03/07/15  ? Personality disorder ([place]) 03/07/15  ? Sinus tachycardia 03/07/15  ? history of  ? Suicidal thoughts 03/07/15  ? ? ?Past Surgical History:  ?Procedure Laterality Date  ? COLONOSCOPY WITH PROPOFOL N/A 12/01/2018  ? Procedure: COLONOSCOPY WITH PROPOFOL;  Surgeon: Jonathon Bellows, MD;  Location: [facility] ENDOSCOPY;  Service: Gastroenterology;  Laterality: N/A;  ? electroconvulsion therapy  03/07/15  ? ? ?Family History  ?Problem Relation Age of Onset  ? Diabetes Mother   ? Hypertension Father   ? Breast cancer Neg Hx   ? Colon cancer Neg Hx   ? ?Social History:  reports that she has never smoked. She has never used smokeless tobacco. She reports that she does not drink alcohol and does not use drugs. ? ?Allergies:  ?Allergies  ?Allergen Reactions  ? Prednisone   ?  Increases blood sugar  ? ? ?(Not in a hospital admission) ? ? ?Results for orders placed or performed during the hospital encounter of 02/15/22 (from the past 48 hour(s))  ?Glucose, capillary     Status: Abnormal  ? Collection Time: 02/15/22 11:09 AM  ?Result Value Ref Range  ? Glucose-Capillary 125 (H) 70 - 99 mg/dL  ?  Comment: Glucose reference range applies only to samples taken after fasting for at least 8 hours.  ? Comment 1 Notify RN   ? Comment 2 Document in Chart   ? ?*Note: Due to a large number of results and/or encounters for the requested time period, some results have not been displayed. A complete set of results can be found in Results Review.  ? ?No results found. ? ?Review of Systems  ?Constitutional:  Negative.   ?HENT: Negative.    ?Eyes: Negative.   ?Respiratory: Negative.    ?Cardiovascular: Negative.   ?Gastrointestinal: Negative.   ?Musculoskeletal: Negative.   ?Skin: Negative.   ?Neurological: Negative.   ?Psychiatric/Behavioral: Negative.    ? ?Blood pressure (!) 141/72, pulse 99, temperature 98.9 ?F (37.2 ?C), temperature source Tympanic, resp. rate 18, height '5\' 3"'$  (1.6 m), weight 111.6 kg, SpO2 99 %. ?Physical Exam ?Vitals and nursing note reviewed.  ?Constitutional:   ?   Appearance: She is well-developed.  ?HENT:  ?   Head: Normocephalic and atraumatic.  ?Eyes:  ?   Conjunctiva/sclera: Conjunctivae normal.  ?   Pupils: Pupils are equal, round, and reactive to light.  ?Cardiovascular:  ?   Heart sounds: Normal heart sounds.  ?Pulmonary:  ?   Effort: Pulmonary effort is normal.  ?Abdominal:  ?   Palpations: Abdomen is soft.  ?Musculoskeletal:     ?   General: Normal range of motion.  ?   Cervical back: Normal range of motion.  ?Skin: ?   General: Skin is warm and dry.  ?Neurological:  ?   General: No focal deficit present.  ?   Mental Status: She is alert.  ?Psychiatric:     ?   Mood and Affect: Mood normal.     ?   Behavior: Behavior normal.     ?   Thought Content: Thought  content normal.     ?   Judgment: Judgment normal.  ?  ? ?Assessment/Plan ?Continue maintenance in 3 week ? ?[name], MD ?02/15/2022, 11:21 AM ? ? ? ?

## 2022-02-15 NOTE — Anesthesia Postprocedure Evaluation (Signed)
Anesthesia Post Note ? ?Patient: Melinda [name] ? ?Procedure(s) Performed: ECT TX ? ?Patient location during evaluation: PACU ?Anesthesia Type: General ?Level of consciousness: awake and alert ?Pain management: pain level controlled ?Vital Signs Assessment: post-procedure vital signs reviewed and stable ?Respiratory status: spontaneous breathing, nonlabored ventilation, respiratory function stable and patient connected to nasal cannula oxygen ?Cardiovascular status: blood pressure returned to baseline and stable ?Postop Assessment: no apparent nausea or vomiting ?Anesthetic complications: no ? ? ?No notable events documented. ? ? ?Last Vitals:  ?Vitals:  ? 02/15/22 1201 02/15/22 1211  ?BP: 102/70 121/86  ?Pulse: (!) 103 100  ?Resp: (!) 24 (!) 22  ?Temp: 36.8 ?C   ?SpO2: 94% 94%  ?  ?Last Pain:  ?Vitals:  ? 02/15/22 1211  ?TempSrc:   ?PainSc: 0-No pain  ? ? ?  ?  ?  ?  ?  ?  ? ?Arita Miss ? ? ? ? ?

## 2022-02-15 NOTE — Transfer of Care (Signed)
Immediate Anesthesia Transfer of Care Note ? ?Patient: Melinda [name] ? ?Procedure(s) Performed: ECT TX ? ?Patient Location: PACU ? ?Anesthesia Type:General ? ?Level of Consciousness: drowsy ? ?Airway & Oxygen Therapy: Patient Spontanous Breathing ? ?Post-op Assessment: Report given to RN and Post -op Vital signs reviewed and stable ? ?Post vital signs: Reviewed and stable ? ?Last Vitals: see Flowsheets ?Vitals Value Taken Time  ?BP    ?Temp    ?Pulse    ?Resp    ?SpO2    ? ? ?Last Pain:  ?Vitals:  ? 02/15/22 1049  ?TempSrc:   ?PainSc: 0-No pain  ?   ? ?  ? ?Complications: No notable events documented. ?

## 2022-02-18 ENCOUNTER — Other Ambulatory Visit: Payer: Self-pay | Admitting: Family Medicine

## 2022-02-18 DIAGNOSIS — M25512 Pain in left shoulder: Secondary | ICD-10-CM

## 2022-03-07 ENCOUNTER — Other Ambulatory Visit: Payer: Self-pay | Admitting: Psychiatry

## 2022-03-08 ENCOUNTER — Ambulatory Visit: Payer: Self-pay | Admitting: Anesthesiology

## 2022-03-08 ENCOUNTER — Encounter: Payer: Self-pay | Admitting: Anesthesiology

## 2022-03-08 ENCOUNTER — Encounter (HOSPITAL_BASED_OUTPATIENT_CLINIC_OR_DEPARTMENT_OTHER)
Admission: RE | Admit: 2022-03-08 | Discharge: 2022-03-08 | Disposition: A | Discharge status: Home / Self Care | Payer: Medicare HMO | Source: Ambulatory Visit | Attending: Psychiatry | Admitting: Psychiatry

## 2022-03-08 DIAGNOSIS — E669 Obesity, unspecified: Secondary | ICD-10-CM | POA: Diagnosis not present

## 2022-03-08 DIAGNOSIS — Z7984 Long term (current) use of oral hypoglycemic drugs: Secondary | ICD-10-CM | POA: Diagnosis not present

## 2022-03-08 DIAGNOSIS — G4730 Sleep apnea, unspecified: Secondary | ICD-10-CM | POA: Diagnosis not present

## 2022-03-08 DIAGNOSIS — I10 Essential (primary) hypertension: Secondary | ICD-10-CM | POA: Diagnosis not present

## 2022-03-08 DIAGNOSIS — F332 Major depressive disorder, recurrent severe without psychotic features: Secondary | ICD-10-CM

## 2022-03-08 DIAGNOSIS — Z79899 Other long term (current) drug therapy: Secondary | ICD-10-CM | POA: Diagnosis not present

## 2022-03-08 DIAGNOSIS — K219 Gastro-esophageal reflux disease without esophagitis: Secondary | ICD-10-CM | POA: Diagnosis not present

## 2022-03-08 DIAGNOSIS — E119 Type 2 diabetes mellitus without complications: Secondary | ICD-10-CM | POA: Diagnosis not present

## 2022-03-08 LAB — GLUCOSE, CAPILLARY: Glucose-Capillary: 139 mg/dL — ABNORMAL HIGH (ref 70–99)

## 2022-03-08 MED ORDER — SUCCINYLCHOLINE CHLORIDE 200 MG/10ML IV SOSY
PREFILLED_SYRINGE | INTRAVENOUS | Status: DC | PRN
  Administered 2022-03-08: 100 mg via INTRAVENOUS

## 2022-03-08 MED ORDER — KETOROLAC TROMETHAMINE 30 MG/ML IJ SOLN: 30.0000 mg | Freq: Once | INTRAMUSCULAR | Status: AC

## 2022-03-08 MED ORDER — SODIUM CHLORIDE 0.9 % IV SOLN: INTRAVENOUS | Status: DC | PRN

## 2022-03-08 MED ORDER — KETOROLAC TROMETHAMINE 30 MG/ML IJ SOLN
INTRAMUSCULAR | Status: AC
  Administered 2022-03-08: 30 mg via INTRAVENOUS
  Filled 2022-03-08: qty 1

## 2022-03-08 MED ORDER — LABETALOL HCL 5 MG/ML IV SOLN
INTRAVENOUS | Status: DC | PRN
  Administered 2022-03-08 (×2): 10 mg via INTRAVENOUS

## 2022-03-08 MED ORDER — GLYCOPYRROLATE 0.2 MG/ML IJ SOLN: 0.4000 mg | Freq: Once | INTRAMUSCULAR | Status: AC

## 2022-03-08 MED ORDER — SODIUM CHLORIDE 0.9 % IV SOLN
500.0000 mL | Freq: Once | INTRAVENOUS | Status: AC
  Administered 2022-03-08: 500 mL via INTRAVENOUS

## 2022-03-08 MED ORDER — ESMOLOL HCL 100 MG/10ML IV SOLN: INTRAVENOUS | Status: DC | PRN

## 2022-03-08 MED ORDER — METHOHEXITAL SODIUM 100 MG/10ML IV SOSY
PREFILLED_SYRINGE | INTRAVENOUS | Status: DC | PRN
  Administered 2022-03-08: 70 mg via INTRAVENOUS

## 2022-03-08 MED ORDER — GLYCOPYRROLATE 0.2 MG/ML IJ SOLN
INTRAMUSCULAR | Status: AC
Start: 2022-03-08 — End: 2022-03-08
  Administered 2022-03-08: 0.4 mg via INTRAVENOUS
  Filled 2022-03-08: qty 2

## 2022-03-08 MED ORDER — ESMOLOL HCL 100 MG/10ML IV SOLN
INTRAVENOUS | Status: DC | PRN
  Administered 2022-03-08: 40 mg via INTRAVENOUS

## 2022-03-08 NOTE — Anesthesia Preprocedure Evaluation (Signed)
Anesthesia Evaluation  ?Patient identified by MRN, date of birth, ID band ?Patient awake ? ? ? ?Reviewed: ?Allergy & Precautions, H&P , NPO status , Patient's Chart, lab work & pertinent test results ? ?History of Anesthesia Complications ?Negative for: history of anesthetic complications ? ?Airway ?Mallampati: III ? ?TM Distance: >3 FB ?Neck ROM: full ? ? ? Dental ? ?(+) Poor Dentition, Chipped ?  ?Pulmonary ?sleep apnea , neg COPD,  ?  ?Pulmonary exam normal ?breath sounds clear to auscultation ? ? ? ? ? ? Cardiovascular ?hypertension, Pt. on medications ?+CHF (Cardiomyopathy)  ?(-) CAD and (-) Past MI Normal cardiovascular exam ?Rhythm:regular Rate:Normal ? ? ?  ?Neuro/Psych ?PSYCHIATRIC DISORDERS Depression Bipolar Disorder  Neuromuscular disease   ? GI/Hepatic ?Neg liver ROS, GERD  Controlled,  ?Endo/Other  ?diabetes, Type 2, Oral Hypoglycemic AgentsMorbid obesity ? Renal/GU ?negative Renal ROS  ?negative genitourinary ?  ?Musculoskeletal ? ? Abdominal ?(+) + obese,   ?Peds ? Hematology ?negative hematology ROS ?(+)   ?Anesthesia Other Findings ?Past Medical History: ?No date: Depression ?No date: Diabetes mellitus without complication ([place]) ?05/15/87: Diabetic peripheral neuropathy ([place]) ?03/07/15: Diabetic peripheral neuropathy ([place]) ?03/07/15: Diabetic peripheral neuropathy ([place]) ?No date: GERD (gastroesophageal reflux disease) ?03/07/15: Hypercholesterolemia ?No date: Hypertension ?03/07/15: Obesity ?03/07/15: Personality disorder ?03/07/15: Sinus tachycardia ([place]) ?    Comment: history of ?03/07/15: Suicidal thoughts ?Past Surgical History: ?03/07/15: electroconvulsion therapy ?BMI   ? Body Mass Index:  36.44 kg/m?  ?  ? Reproductive/Obstetrics ? ?  ? ? ? ? ? ? ? ? ? ? ? ? ? ?  ?  ? ? ? ? ? ? ? ? ?Anesthesia Physical ? ?Anesthesia Plan ? ?ASA: 3 ? ?Anesthesia Plan: General  ? ?Post-op Pain Management: Minimal or no pain anticipated  ? ?Induction: Intravenous ? ?PONV Risk Score and  Plan: 3 and Treatment may vary due to age or medical condition, TIVA and Ondansetron ? ?Airway Management Planned: Mask ? ?Additional Equipment: None ? ?Intra-op Plan:  ? ?Post-operative Plan:  ? ?Informed Consent: I have reviewed the patients History and Physical, chart, labs and discussed the procedure including the risks, benefits and alternatives for the proposed anesthesia with the patient or authorized representative who has indicated his/her understanding and acceptance.  ? ? ? ?Dental Advisory Given ? ?Plan Discussed with: CRNA and Anesthesiologist ? ?Anesthesia Plan Comments: (Discussed risks of anesthesia with patient, including PONV, muscle aches. Rare risks discussed as well, such as cardiorespiratory sequelae, need for airway intervention and its associated risks including lip/dental/eye damage and sore throat, and allergic reactions. Discussed the role of CRNA in patient's perioperative care. Patient understands.)  ? ? ? ? ? ? ?Anesthesia Quick Evaluation ? ?

## 2022-03-08 NOTE — Transfer of Care (Signed)
Immediate Anesthesia Transfer of Care Note ? ?Patient: Melinda [name] ? ?Procedure(s) Performed: ECT TX ? ?Patient Location: PACU ? ?Anesthesia Type:General ? ?Level of Consciousness: drowsy ? ?Airway & Oxygen Therapy: Patient Spontanous Breathing and Patient connected to face mask oxygen ? ?Post-op Assessment: Report given to RN and Post -op Vital signs reviewed and stable ? ?Post vital signs: Reviewed and stable ? ?Last Vitals:  ?Vitals Value Taken Time  ?BP    ?Temp    ?Pulse 111 03/08/22 1146  ?Resp 19 03/08/22 1146  ?SpO2 86 % 03/08/22 1146  ?Vitals shown include unvalidated device data. ? ?Last Pain:  ?Vitals:  ? 03/08/22 1025  ?TempSrc:   ?PainSc: 0-No pain  ?   ? ?  ? ?Complications: No notable events documented. ?

## 2022-03-08 NOTE — H&P (Signed)
Melinda [name] is an 51 y.o. female.   ?Chief Complaint: No new complaint.  No physical complaints.  Mood stable ?HPI: Recurrent severe depression with good control with ECT and medicine ? ?Past Medical History:  ?Diagnosis Date  ? Depression   ? Diabetes mellitus without complication ([place])   ? Diabetic peripheral neuropathy ([place]) 03/07/15  ? Diabetic peripheral neuropathy ([place]) 03/07/15  ? Diabetic peripheral neuropathy ([place]) 03/07/15  ? GERD (gastroesophageal reflux disease)   ? Hypercholesterolemia 03/07/15  ? Hypertension   ? Obesity 03/07/15  ? Personality disorder ([place]) 03/07/15  ? Sinus tachycardia 03/07/15  ? history of  ? Suicidal thoughts 03/07/15  ? ? ?Past Surgical History:  ?Procedure Laterality Date  ? COLONOSCOPY WITH PROPOFOL N/A 12/01/2018  ? Procedure: COLONOSCOPY WITH PROPOFOL;  Surgeon: Jonathon Bellows, MD;  Location: [facility] ENDOSCOPY;  Service: Gastroenterology;  Laterality: N/A;  ? electroconvulsion therapy  03/07/15  ? ? ?Family History  ?Problem Relation Age of Onset  ? Diabetes Mother   ? Hypertension Father   ? Breast cancer Neg Hx   ? Colon cancer Neg Hx   ? ?Social History:  reports that she has never smoked. She has never used smokeless tobacco. She reports that she does not drink alcohol and does not use drugs. ? ?Allergies:  ?Allergies  ?Allergen Reactions  ? Prednisone   ?  Increases blood sugar  ? ? ?(Not in a hospital admission) ? ? ?Results for orders placed or performed during the hospital encounter of 03/08/22 (from the past 48 hour(s))  ?Glucose, capillary     Status: Abnormal  ? Collection Time: 03/08/22 10:56 AM  ?Result Value Ref Range  ? Glucose-Capillary 139 (H) 70 - 99 mg/dL  ?  Comment: Glucose reference range applies only to samples taken after fasting for at least 8 hours.  ? ?*Note: Due to a large number of results and/or encounters for the requested time period, some results have not been displayed. A complete set of results can be found in Results Review.  ? ?No results  found. ? ?Review of Systems  ?Constitutional: Negative.   ?HENT: Negative.    ?Eyes: Negative.   ?Respiratory: Negative.    ?Cardiovascular: Negative.   ?Gastrointestinal: Negative.   ?Musculoskeletal: Negative.   ?Skin: Negative.   ?Neurological: Negative.   ?Psychiatric/Behavioral: Negative.    ? ?Blood pressure 128/68, pulse 95, temperature 99 ?F (37.2 ?C), temperature source Tympanic, resp. rate 18, height '5\' 3"'$  (1.6 m), weight 111.6 kg, SpO2 96 %. ?Physical Exam ?Vitals and nursing note reviewed.  ?Constitutional:   ?   Appearance: She is well-developed.  ?HENT:  ?   Head: Normocephalic and atraumatic.  ?Eyes:  ?   Conjunctiva/sclera: Conjunctivae normal.  ?   Pupils: Pupils are equal, round, and reactive to light.  ?Cardiovascular:  ?   Heart sounds: Normal heart sounds.  ?Pulmonary:  ?   Effort: Pulmonary effort is normal.  ?Abdominal:  ?   Palpations: Abdomen is soft.  ?Musculoskeletal:     ?   General: Normal range of motion.  ?   Cervical back: Normal range of motion.  ?Skin: ?   General: Skin is warm and dry.  ?Neurological:  ?   General: No focal deficit present.  ?   Mental Status: She is alert.  ?Psychiatric:     ?   Mood and Affect: Mood normal.     ?   Behavior: Behavior normal.     ?   Thought Content:  Thought content normal.     ?   Judgment: Judgment normal.  ?  ? ?Assessment/Plan ?Treatment today follow-up in 3 weeks ? ?[name], MD ?03/08/2022, 1:47 PM ? ? ? ?

## 2022-03-08 NOTE — Procedures (Signed)
ECT SERVICES ?Physician?s Interval Evaluation & Treatment Note ? ?Patient Identification: Melinda [name] ?MRN:  [id number] ?Date of Evaluation:  03/08/2022 ?[place] #: [id number] ? ?MADRS:  ? ?MMSE:  ? ?P.E. Findings: ? ?No change to physical exam ? ?Psychiatric Interval Note: ? ?No change to psychiatric exam ? ?Subjective: ? ?Patient is a 51 y.o. female seen for evaluation for Electroconvulsive Therapy. ?Stable no complaint ? ?Treatment Summary: ? ? '[]'$   Right Unilateral             '[x]'$  Bilateral ?  ?% Energy : 1.0 ms 35% ? ? Impedance: 990 ohms ? ?Seizure Energy Index: 8315 ?V squared ? ?Postictal Suppression Index: 44% 95% ? ?Seizure Concordance Index: 95% ? ?Medications ? ?Pre Shock: Robinul 0.4 mg Toradol 30 mg labetalol 30 mg esmolol 20 mg Brevital 70 mg succinylcholine 100 mg ? ?Post Shock:   ? ?Seizure Duration: 33 seconds EMG 87 seconds EEG ? ? ?Comments: ?Next treatment 3 weeks ? ?Lungs: ? ?'[x]'$   Clear to auscultation              ? ?'[]'$  Other:  ? ?Heart: ?  ? '[x]'$   Regular rhythm             '[]'$  irregular rhythm ? ? ? '[x]'$   Previous H&P reviewed, patient examined and there are NO CHANGES              ?  ? '[]'$   Previous H&P reviewed, patient examined and there are changes noted. ? ? ?[name], MD ?4/28/20231:49 PM ? ?  ?

## 2022-03-11 NOTE — Anesthesia Postprocedure Evaluation (Signed)
Anesthesia Post Note ? ?Patient: Melinda [name] ? ?Procedure(s) Performed: ECT TX ? ?Patient location during evaluation: PACU ?Anesthesia Type: General ?Level of consciousness: awake and alert ?Pain management: pain level controlled ?Vital Signs Assessment: post-procedure vital signs reviewed and stable ?Respiratory status: spontaneous breathing, nonlabored ventilation and respiratory function stable ?Cardiovascular status: blood pressure returned to baseline and stable ?Postop Assessment: no apparent nausea or vomiting ?Anesthetic complications: no ? ? ?No notable events documented. ? ? ?Last Vitals:  ?Vitals:  ? 03/08/22 1222 03/08/22 1243  ?BP: 130/70 128/68  ?Pulse: 96 95  ?Resp: 20 18  ?Temp:  37.2 ?C  ?SpO2: 96%   ?  ?Last Pain:  ?Vitals:  ? 03/08/22 1243  ?TempSrc: Tympanic  ?PainSc: 0-No pain  ? ? ?  ?  ?  ?  ?  ?  ? ?[place] ? ? ? ? ?

## 2022-03-14 ENCOUNTER — Ambulatory Visit: Payer: Medicare HMO | Admitting: Family Medicine

## 2022-03-19 ENCOUNTER — Other Ambulatory Visit: Payer: Self-pay | Admitting: Family Medicine

## 2022-03-19 DIAGNOSIS — E119 Type 2 diabetes mellitus without complications: Secondary | ICD-10-CM

## 2022-03-20 NOTE — Progress Notes (Signed)
?  ? ? ?I,Roshena L Chambers,acting as a scribe for Lavon Paganini, MD.,have documented all relevant documentation on the behalf of Lavon Paganini, MD,as directed by  Lavon Paganini, MD while in the presence of Lavon Paganini, MD.  ? ?Established patient visit ? ? ?Patient: Melinda [name]   DOB: [birth year]   51 y.o. Female  MRN: [id number] ?Visit Date: 03/21/2022 ? ?Today's healthcare provider: Lavon Paganini, MD  ? ?Chief Complaint  ?Patient presents with  ? Diabetes  ? ?Subjective  ?  ?HPI  ?Diabetes Mellitus Type II, follow-up ? ?Lab Results  ?Component Value Date  ? HGBA1C 8.0 (A) 03/21/2022  ? HGBA1C 8.2 (H) 12/06/2021  ? HGBA1C 6.3 (A) 03/15/2021  ? ?Last seen for diabetes 3 months ago.  ?Management since then includes continuing the same treatment. diabetes is uncontrolled with A1c of 8.2 ?She reports good compliance with treatment. ?She is not having side effects.  ? ?Home blood sugar records: fasting range: 130's ? ?Episodes of hypoglycemia? No  ?  ?Current insulin regiment: none ?Most Recent Eye Exam: UTD ? ?--------------------------------------------------------------------------------------------------- ? ? ?Medications: ?Outpatient Medications Prior to Visit  ?Medication Sig  ? Alcohol Swabs (B-D SINGLE USE SWABS REGULAR) PADS USE AS DIRECTED  ? atorvastatin (LIPITOR) 10 MG tablet TAKE 1 TABLET EVERY DAY  ? Blood Glucose Monitoring Suppl (TRUE METRIX METER) w/Device KIT USE AS DIRECTED AS NEEDED  FOR  DIABETES  MELLITUS  TYPE 2  ? buPROPion (WELLBUTRIN XL) 300 MG 24 hr tablet TAKE 1 TABLET EVERY DAY  ? escitalopram (LEXAPRO) 20 MG tablet Take 1 tablet (20 mg total) by mouth daily.  ? lisinopril (ZESTRIL) 10 MG tablet TAKE 1 TABLET EVERY DAY  ? meloxicam (MOBIC) 7.5 MG tablet TAKE 1 TABLET EVERY DAY  ? TRUE METRIX BLOOD GLUCOSE TEST test strip Check fasting blood sugar once daily  ? ziprasidone (GEODON) 80 MG capsule TAKE 1 CAPSULE TWICE DAILY  ? [DISCONTINUED] metFORMIN (GLUCOPHAGE-XR)  500 MG 24 hr tablet TAKE 1 TABLET (500 MG TOTAL) DAILY WITH BREAKFAST.  ? [DISCONTINUED] Accu-Chek Softclix Lancets lancets Use to check blood sugar daily (Patient not taking: Reported on 03/21/2022)  ? ?No facility-administered medications prior to visit.  ? ? ?Review of Systems  ?Constitutional:  Negative for appetite change, chills, fatigue and fever.  ?Respiratory:  Negative for chest tightness and shortness of breath.   ?Cardiovascular:  Negative for chest pain and palpitations.  ?Gastrointestinal:  Negative for abdominal pain, nausea and vomiting.  ?Neurological:  Negative for dizziness and weakness.  ? ?  ?  Objective  ?  ?BP 135/85 (BP Location: Left Arm)   Pulse 77   Temp 98.8 ?F (37.1 ?C) (Oral)   Resp 14   Wt 249 lb (112.9 kg)   LMP  (LMP Unknown) Comment: pt states she has not had period in the past year  BMI 44.11 kg/m?  ?BP Readings from Last 3 Encounters:  ?03/21/22 135/85  ?12/06/21 132/80  ?11/29/21 120/79  ? ?Wt Readings from Last 3 Encounters:  ?03/21/22 249 lb (112.9 kg)  ?12/06/21 246 lb (111.6 kg)  ?11/29/21 245 lb (111.1 kg)  ? ?  ? ?Physical Exam ?Vitals reviewed.  ?Constitutional:   ?   General: She is not in acute distress. ?   Appearance: Normal appearance. She is well-developed. She is not diaphoretic.  ?HENT:  ?   Head: Normocephalic and atraumatic.  ?Eyes:  ?   General: No scleral icterus. ?   Conjunctiva/sclera: Conjunctivae normal.  ?Neck:  ?  Thyroid: No thyromegaly.  ?Cardiovascular:  ?   Rate and Rhythm: Normal rate and regular rhythm.  ?   Pulses: Normal pulses.  ?   Heart sounds: Normal heart sounds. No murmur heard. ?Pulmonary:  ?   Effort: Pulmonary effort is normal. No respiratory distress.  ?   Breath sounds: Normal breath sounds. No wheezing, rhonchi or rales.  ?Musculoskeletal:  ?   Cervical back: Neck supple.  ?   Right lower leg: No edema.  ?   Left lower leg: No edema.  ?Lymphadenopathy:  ?   Cervical: No cervical adenopathy.  ?Skin: ?   General: Skin is warm and  dry.  ?   Findings: No rash.  ?Neurological:  ?   Mental Status: She is alert and oriented to person, place, and time. Mental status is at baseline.  ?Psychiatric:     ?   Mood and Affect: Mood normal.     ?   Behavior: Behavior normal.  ?  ?Diabetic Foot Exam - Simple   ?Simple Foot Form ?Diabetic Foot exam was performed with the following findings: Yes 03/21/2022  8:38 AM  ?Visual Inspection ?No deformities, no ulcerations, no other skin breakdown bilaterally: Yes ?Sensation Testing ?Intact to touch and monofilament testing bilaterally: Yes ?Pulse Check ?Posterior Tibialis and Dorsalis pulse intact bilaterally: Yes ?Comments ?  ?  ? ?Results for orders placed or performed in visit on 03/21/22  ?POCT glycosylated hemoglobin (Hb A1C)  ?Result Value Ref Range  ? Hemoglobin A1C 8.0 (A) 4.0 - 5.6 %  ? Est. average glucose Bld gHb Est-mCnc 183   ? ? Assessment & Plan  ?  ? ?Problem List Items Addressed This Visit   ? ?  ? Cardiovascular and Mediastinum  ? Hypertension associated with diabetes ([place])  ?  Well controlled ?Continue current medications ?Reviewed recent metabolic panel ?  ?  ? Relevant Medications  ? empagliflozin (JARDIANCE) 10 MG TABS tablet  ? metFORMIN (GLUCOPHAGE-XR) 500 MG 24 hr tablet  ?  ? Endocrine  ? T2DM (type 2 diabetes mellitus) ([place]) - Primary  ?  Chronic and uncontrolled with hyperglycemia ?Discussed goal A1c <7 ?Continue Metformin at current dose ?Add jardiance 10 mg daily ?UTD on vaccines, eye exam, foot exam (completed today) ?uACR today ?On ACEi/ARB ?On Statin ?Discussed diet and exercise ?F/u in 3 months  ?  ?  ? Relevant Medications  ? empagliflozin (JARDIANCE) 10 MG TABS tablet  ? metFORMIN (GLUCOPHAGE-XR) 500 MG 24 hr tablet  ? Other Relevant Orders  ? POCT glycosylated hemoglobin (Hb A1C) (Completed)  ? Urine Albumin-Creatinine with uACR  ? Hyperlipidemia associated with type 2 diabetes mellitus ([place])  ?  Previously well controlled ?Continue statin ?Repeat FLP and CMP ?  ?  ? Relevant  Medications  ? empagliflozin (JARDIANCE) 10 MG TABS tablet  ? metFORMIN (GLUCOPHAGE-XR) 500 MG 24 hr tablet  ? Other Relevant Orders  ? Lipid panel  ? Hepatic function panel  ?  ? Other  ? Morbid obesity ([place])  ?  Discussed importance of healthy weight management ?Discussed diet and exercise  ?  ?  ? Relevant Medications  ? empagliflozin (JARDIANCE) 10 MG TABS tablet  ? metFORMIN (GLUCOPHAGE-XR) 500 MG 24 hr tablet  ? Encopresis  ?  Had normal colonoscopy ?Only happens occasionally ?Can try to decrease Metformin to once daily and see if this helps ? ?  ?  ?  ? ?Return in about 3 months (around 06/21/2022) for chronic disease f/u.  ?   ? ?  I,  , MD, have reviewed all documentation for this visit. The documentation on 03/21/22 for the exam, diagnosis, procedures, and orders are all accurate and complete. ? ? ?,  M, MD, MPH ?[facility] ?[facility]   ?

## 2022-03-21 ENCOUNTER — Encounter: Payer: Self-pay | Admitting: Family Medicine

## 2022-03-21 ENCOUNTER — Ambulatory Visit (INDEPENDENT_AMBULATORY_CARE_PROVIDER_SITE_OTHER): Payer: Medicare HMO | Admitting: Family Medicine

## 2022-03-21 VITALS — BP 135/85 | HR 77 | Temp 98.8°F | Resp 14 | Wt 249.0 lb

## 2022-03-21 DIAGNOSIS — E1165 Type 2 diabetes mellitus with hyperglycemia: Secondary | ICD-10-CM | POA: Diagnosis not present

## 2022-03-21 DIAGNOSIS — E1169 Type 2 diabetes mellitus with other specified complication: Secondary | ICD-10-CM | POA: Diagnosis not present

## 2022-03-21 DIAGNOSIS — R159 Full incontinence of feces: Secondary | ICD-10-CM | POA: Diagnosis not present

## 2022-03-21 DIAGNOSIS — I152 Hypertension secondary to endocrine disorders: Secondary | ICD-10-CM

## 2022-03-21 DIAGNOSIS — E785 Hyperlipidemia, unspecified: Secondary | ICD-10-CM

## 2022-03-21 DIAGNOSIS — E1159 Type 2 diabetes mellitus with other circulatory complications: Secondary | ICD-10-CM | POA: Diagnosis not present

## 2022-03-21 LAB — POCT GLYCOSYLATED HEMOGLOBIN (HGB A1C)
Est. average glucose Bld gHb Est-mCnc: 183
Hemoglobin A1C: 8 % — AB (ref 4.0–5.6)

## 2022-03-21 MED ORDER — METFORMIN HCL ER 500 MG PO TB24: 500.0000 mg | ORAL_TABLET | Freq: Two times a day (BID) | ORAL | 1 refills | Status: DC

## 2022-03-21 MED ORDER — EMPAGLIFLOZIN 10 MG PO TABS: 10.0000 mg | ORAL_TABLET | Freq: Every day | ORAL | 1 refills | Status: DC

## 2022-03-21 NOTE — Assessment & Plan Note (Signed)
Chronic and uncontrolled with hyperglycemia ?Discussed goal A1c <7 ?Continue Metformin at current dose ?Add jardiance 10 mg daily ?UTD on vaccines, eye exam, foot exam (completed today) ?uACR today ?On ACEi/ARB ?On Statin ?Discussed diet and exercise ?F/u in 3 months  ?

## 2022-03-21 NOTE — Assessment & Plan Note (Signed)
Had normal colonoscopy ?Only happens occasionally ?Can try to decrease Metformin to once daily and see if this helps ?

## 2022-03-21 NOTE — Assessment & Plan Note (Signed)
Discussed importance of healthy weight management Discussed diet and exercise  

## 2022-03-21 NOTE — Assessment & Plan Note (Signed)
Well controlled Continue current medications Reviewed recent metabolic panel 

## 2022-03-21 NOTE — Assessment & Plan Note (Signed)
Previously well controlled Continue statin Repeat FLP and CMP  

## 2022-03-22 LAB — MICROALBUMIN / CREATININE URINE RATIO
Creatinine, Urine: 145.4 mg/dL
Microalb/Creat Ratio: 11 mg/g creat (ref 0–29)
Microalbumin, Urine: 16.6 ug/mL

## 2022-03-22 LAB — HEPATIC FUNCTION PANEL
ALT: 14 IU/L (ref 0–32)
AST: 14 IU/L (ref 0–40)
Albumin: 4 g/dL (ref 3.8–4.9)
Alkaline Phosphatase: 132 IU/L — ABNORMAL HIGH (ref 44–121)
Bilirubin Total: <0.2 mg/dL (ref 0.0–1.2)
Bilirubin, Direct: <0.1 mg/dL (ref 0.00–0.40)
Total Protein: 6.2 g/dL (ref 6.0–8.5)

## 2022-03-22 LAB — LIPID PANEL
Chol/HDL Ratio: 3 ratio (ref 0.0–4.4)
Cholesterol, Total: 136 mg/dL (ref 100–199)
HDL: 46 mg/dL (ref >39)
LDL Chol Calc (NIH): 71 mg/dL (ref 0–99)
Triglycerides: 102 mg/dL (ref 0–149)
VLDL Cholesterol Cal: 19 mg/dL (ref 5–40)

## 2022-03-28 ENCOUNTER — Other Ambulatory Visit: Payer: Self-pay | Admitting: Psychiatry

## 2022-03-28 DIAGNOSIS — E119 Type 2 diabetes mellitus without complications: Secondary | ICD-10-CM

## 2022-03-29 ENCOUNTER — Ambulatory Visit: Payer: Self-pay | Admitting: Anesthesiology

## 2022-03-29 ENCOUNTER — Encounter: Payer: Self-pay | Admitting: Anesthesiology

## 2022-03-29 ENCOUNTER — Other Ambulatory Visit: Payer: Self-pay | Admitting: Psychiatry

## 2022-03-29 ENCOUNTER — Encounter
Admission: RE | Admit: 2022-03-29 | Discharge: 2022-03-29 | Disposition: A | Discharge status: Home / Self Care | Payer: Medicare HMO | Source: Ambulatory Visit | Attending: Psychiatry | Admitting: Psychiatry

## 2022-03-29 DIAGNOSIS — F339 Major depressive disorder, recurrent, unspecified: Secondary | ICD-10-CM | POA: Diagnosis not present

## 2022-03-29 DIAGNOSIS — Z8249 Family history of ischemic heart disease and other diseases of the circulatory system: Secondary | ICD-10-CM | POA: Insufficient documentation

## 2022-03-29 DIAGNOSIS — E119 Type 2 diabetes mellitus without complications: Secondary | ICD-10-CM

## 2022-03-29 DIAGNOSIS — G4730 Sleep apnea, unspecified: Secondary | ICD-10-CM | POA: Insufficient documentation

## 2022-03-29 DIAGNOSIS — Z833 Family history of diabetes mellitus: Secondary | ICD-10-CM | POA: Insufficient documentation

## 2022-03-29 DIAGNOSIS — I10 Essential (primary) hypertension: Secondary | ICD-10-CM | POA: Diagnosis not present

## 2022-03-29 DIAGNOSIS — Z6841 Body Mass Index (BMI) 40.0 and over, adult: Secondary | ICD-10-CM | POA: Diagnosis not present

## 2022-03-29 DIAGNOSIS — K219 Gastro-esophageal reflux disease without esophagitis: Secondary | ICD-10-CM | POA: Diagnosis not present

## 2022-03-29 DIAGNOSIS — F332 Major depressive disorder, recurrent severe without psychotic features: Secondary | ICD-10-CM | POA: Diagnosis not present

## 2022-03-29 DIAGNOSIS — E1142 Type 2 diabetes mellitus with diabetic polyneuropathy: Secondary | ICD-10-CM | POA: Insufficient documentation

## 2022-03-29 LAB — GLUCOSE, CAPILLARY: Glucose-Capillary: 130 mg/dL — ABNORMAL HIGH (ref 70–99)

## 2022-03-29 MED ORDER — METHOHEXITAL SODIUM 100 MG/10ML IV SOSY
PREFILLED_SYRINGE | INTRAVENOUS | Status: DC | PRN
  Administered 2022-03-29: 70 mg via INTRAVENOUS

## 2022-03-29 MED ORDER — METHOHEXITAL SODIUM 0.5 G IJ SOLR
INTRAMUSCULAR | Status: AC
  Filled 2022-03-29: qty 500

## 2022-03-29 MED ORDER — SUCCINYLCHOLINE CHLORIDE 200 MG/10ML IV SOSY
PREFILLED_SYRINGE | INTRAVENOUS | Status: AC
  Filled 2022-03-29: qty 10

## 2022-03-29 MED ORDER — GLYCOPYRROLATE 0.2 MG/ML IJ SOLN
INTRAMUSCULAR | Status: AC
  Administered 2022-03-29: 0.4 mg via INTRAVENOUS
  Filled 2022-03-29: qty 2

## 2022-03-29 MED ORDER — SODIUM CHLORIDE 0.9 % IV SOLN
500.0000 mL | Freq: Once | INTRAVENOUS | Status: AC
  Administered 2022-03-29: 500 mL via INTRAVENOUS

## 2022-03-29 MED ORDER — ESCITALOPRAM OXALATE 20 MG PO TABS: 20.0000 mg | ORAL_TABLET | Freq: Every day | ORAL | 1 refills | Status: DC

## 2022-03-29 MED ORDER — GLYCOPYRROLATE 0.2 MG/ML IJ SOLN: 0.4000 mg | Freq: Once | INTRAMUSCULAR | Status: AC

## 2022-03-29 MED ORDER — ESMOLOL HCL 100 MG/10ML IV SOLN
INTRAVENOUS | Status: AC
  Filled 2022-03-29: qty 10

## 2022-03-29 MED ORDER — ESMOLOL HCL 100 MG/10ML IV SOLN
INTRAVENOUS | Status: DC | PRN
  Administered 2022-03-29: 30 mg via INTRAVENOUS

## 2022-03-29 MED ORDER — KETOROLAC TROMETHAMINE 30 MG/ML IJ SOLN
INTRAMUSCULAR | Status: AC
  Administered 2022-03-29: 30 mg via INTRAVENOUS
  Filled 2022-03-29: qty 1

## 2022-03-29 MED ORDER — SODIUM CHLORIDE 0.9 % IV SOLN: INTRAVENOUS | Status: DC | PRN

## 2022-03-29 MED ORDER — SUCCINYLCHOLINE CHLORIDE 200 MG/10ML IV SOSY
PREFILLED_SYRINGE | INTRAVENOUS | Status: DC | PRN
  Administered 2022-03-29: 100 mg via INTRAVENOUS

## 2022-03-29 MED ORDER — LABETALOL HCL 5 MG/ML IV SOLN
INTRAVENOUS | Status: DC | PRN
  Administered 2022-03-29: 10 mg via INTRAVENOUS

## 2022-03-29 MED ORDER — LABETALOL HCL 5 MG/ML IV SOLN
INTRAVENOUS | Status: AC
  Filled 2022-03-29: qty 4

## 2022-03-29 MED ORDER — KETOROLAC TROMETHAMINE 30 MG/ML IJ SOLN: 30.0000 mg | Freq: Once | INTRAMUSCULAR | Status: AC

## 2022-03-29 NOTE — Procedures (Signed)
ECT SERVICES Physician's Interval Evaluation & Treatment Note  Patient Identification: Melinda [name] MRN:  [id number] Date of Evaluation:  03/29/2022 [place] #: [id number]  MADRS:   MMSE:   P.E. Findings:  No change to physical exam  Psychiatric Interval Note:  Upbeat actually feeling pretty well.  Still really enjoying her grandchild.  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 female seen for evaluation for Electroconvulsive Therapy. No complaint  Treatment Summary:   '[]'$   Right Unilateral             '[x]'$  Bilateral   % Energy : 1.0 ms 35%   Impedance: 1040 ohms  Seizure Energy Index: 4471 V squared  Postictal Suppression Index: 87%  Seizure Concordance Index: 93%  Medications  Pre Shock: Robinul 0.4 mg Toradol 30 mg labetalol 30 mg esmolol 20 mg Brevital 70 mg succinylcholine 100 mg  Post Shock:    Seizure Duration: 32 seconds EMG 82 seconds EEG   Comments: Follow-up 3 weeks  Lungs:  '[x]'$   Clear to auscultation               '[]'$  Other:   Heart:    '[x]'$   Regular rhythm             '[]'$  irregular rhythm    '[x]'$   Previous H&P reviewed, patient examined and there are NO CHANGES                 '[]'$   Previous H&P reviewed, patient examined and there are changes noted.   [name], MD 5/19/20231:43 PM

## 2022-03-29 NOTE — H&P (Signed)
Melinda [name] is an 51 y.o. female.   Chief Complaint: Mood is pretty stable.  Still enjoying time with her grandchild HPI: History of recurrent depression with stable treatment with maintenance ECT  Past Medical History:  Diagnosis Date   Depression    Diabetes mellitus without complication ([place])    Diabetic peripheral neuropathy ([place]) 03/07/15   Diabetic peripheral neuropathy ([place]) 03/07/15   Diabetic peripheral neuropathy ([place]) 03/07/15   GERD (gastroesophageal reflux disease)    Hypercholesterolemia 03/07/15   Hypertension    Obesity 03/07/15   Personality disorder ([place]) 03/07/15   Sinus tachycardia 03/07/15   history of   Suicidal thoughts 03/07/15    Past Surgical History:  Procedure Laterality Date   COLONOSCOPY WITH PROPOFOL N/A 12/01/2018   Procedure: COLONOSCOPY WITH PROPOFOL;  Surgeon: Jonathon Bellows, MD;  Location: [facility] ENDOSCOPY;  Service: Gastroenterology;  Laterality: N/A;   electroconvulsion therapy  03/07/15    Family History  Problem Relation Age of Onset   Diabetes Mother    Hypertension Father    Breast cancer Neg Hx    Colon cancer Neg Hx    Social History:  reports that she has never smoked. She has never used smokeless tobacco. She reports that she does not drink alcohol and does not use drugs.  Allergies:  Allergies  Allergen Reactions   Prednisone     Increases blood sugar    (Not in a hospital admission)   Results for orders placed or performed during the hospital encounter of 03/29/22 (from the past 48 hour(s))  Glucose, capillary     Status: Abnormal   Collection Time: 03/29/22 11:21 AM  Result Value Ref Range   Glucose-Capillary 130 (H) 70 - 99 mg/dL    Comment: Glucose reference range applies only to samples taken after fasting for at least 8 hours.   *Note: Due to a large number of results and/or encounters for the requested time period, some results have not been displayed. A complete set of results can be found in Results Review.   No  results found.  Review of Systems  Constitutional: Negative.   HENT: Negative.    Eyes: Negative.   Respiratory: Negative.    Cardiovascular: Negative.   Gastrointestinal: Negative.   Musculoskeletal: Negative.   Skin: Negative.   Neurological: Negative.   Psychiatric/Behavioral: Negative.     Blood pressure (!) 235/206, pulse (!) 107, temperature (!) 97.1 F (36.2 C), resp. rate 20, height '5\' 3"'$  (1.6 m), weight 112.9 kg, SpO2 97 %. Physical Exam Vitals and nursing note reviewed.  Constitutional:      Appearance: She is well-developed.  HENT:     Head: Normocephalic and atraumatic.  Eyes:     Conjunctiva/sclera: Conjunctivae normal.     Pupils: Pupils are equal, round, and reactive to light.  Cardiovascular:     Heart sounds: Normal heart sounds.  Pulmonary:     Effort: Pulmonary effort is normal.  Abdominal:     Palpations: Abdomen is soft.  Musculoskeletal:        General: Normal range of motion.     Cervical back: Normal range of motion.  Skin:    General: Skin is warm and dry.  Neurological:     General: No focal deficit present.     Mental Status: She is alert.  Psychiatric:        Mood and Affect: Mood normal.        Thought Content: Thought content normal.  Judgment: Judgment normal.     Assessment/Plan Treatment today as usual follow-up 3 weeks  [name], MD 03/29/2022, 1:38 PM

## 2022-03-29 NOTE — Anesthesia Postprocedure Evaluation (Signed)
Anesthesia Post Note  Patient: Melinda [name]  Procedure(s) Performed: ECT TX  Patient location during evaluation: PACU Anesthesia Type: General Level of consciousness: awake and alert, oriented and patient cooperative Pain management: pain level controlled Vital Signs Assessment: post-procedure vital signs reviewed and stable Respiratory status: spontaneous breathing, nonlabored ventilation and respiratory function stable Cardiovascular status: blood pressure returned to baseline and stable Postop Assessment: adequate PO intake Anesthetic complications: no   No notable events documented.   Last Vitals:  Vitals:   03/29/22 1200 03/29/22 1210  BP: (!) 123/109 (!) 235/206  Pulse: (!) 109 (!) 107  Resp: (!) 28 20  Temp:    SpO2: 100% 97%    Last Pain:  Vitals:   03/29/22 1123  TempSrc:   PainSc: 0-No pain                 Darrin Nipper

## 2022-03-29 NOTE — Transfer of Care (Signed)
Immediate Anesthesia Transfer of Care Note  Patient: Melinda [name]  Procedure(s) Performed: * No procedures listed *  Patient Location: PACU  Anesthesia Type:General  Level of Consciousness: sedated  Airway & Oxygen Therapy: Patient Spontanous Breathing and Patient connected to face mask oxygen  Post-op Assessment: Report given to RN and Post -op Vital signs reviewed and stable  Post vital signs: Reviewed and stable  Last Vitals:  Vitals:   03/29/22 1122 03/29/22 1156  BP: (!) 163/97 (!) 135/112  Pulse: 97 (!) 109  Resp: 18 17  Temp: 36.8 C   SpO2: 81% 388%    Complications: No apparent anesthesia complications

## 2022-03-29 NOTE — Anesthesia Preprocedure Evaluation (Addendum)
Anesthesia Evaluation  Patient identified by MRN, date of birth, ID band Patient awake    Reviewed: Allergy & Precautions, NPO status , Patient's Chart, lab work & pertinent test results  History of Anesthesia Complications Negative for: history of anesthetic complications  Airway Mallampati: II   Neck ROM: Full    Dental  (+) Missing   Pulmonary sleep apnea ,    Pulmonary exam normal breath sounds clear to auscultation       Cardiovascular hypertension, Normal cardiovascular exam Rhythm:Regular Rate:Normal     Neuro/Psych PSYCHIATRIC DISORDERS Depression Bipolar Disorder  Neuromuscular disease (peripheral neuropathy)    GI/Hepatic GERD  ,  Endo/Other  diabetes, Type 2Class 3 obesity  Renal/GU negative Renal ROS     Musculoskeletal   Abdominal   Peds  Hematology negative hematology ROS (+)   Anesthesia Other Findings   Reproductive/Obstetrics                            Anesthesia Physical Anesthesia Plan  ASA: 3  Anesthesia Plan: General   Post-op Pain Management:    Induction: Intravenous  PONV Risk Score and Plan: 3 and TIVA and Treatment may vary due to age or medical condition  Airway Management Planned: Natural Airway  Additional Equipment:   Intra-op Plan:   Post-operative Plan:   Informed Consent: I have reviewed the patients History and Physical, chart, labs and discussed the procedure including the risks, benefits and alternatives for the proposed anesthesia with the patient or authorized representative who has indicated his/her understanding and acceptance.       Plan Discussed with: CRNA  Anesthesia Plan Comments: (LMA/GETA backup discussed.  Patient consented for risks of anesthesia including but not limited to:  - adverse reactions to medications - damage to eyes, teeth, lips or other oral mucosa - nerve damage due to positioning  - sore throat or  hoarseness - damage to heart, brain, nerves, lungs, other parts of body or loss of life  Informed patient about role of CRNA in peri- and intra-operative care.  Patient voiced understanding.)        Anesthesia Quick Evaluation

## 2022-04-19 ENCOUNTER — Encounter
Admission: RE | Admit: 2022-04-19 | Discharge: 2022-04-19 | Disposition: A | Discharge status: Home / Self Care | Payer: Medicare HMO | Source: Ambulatory Visit | Attending: Psychiatry | Admitting: Psychiatry

## 2022-04-19 ENCOUNTER — Ambulatory Visit: Payer: Self-pay

## 2022-04-19 ENCOUNTER — Other Ambulatory Visit: Payer: Self-pay | Admitting: Psychiatry

## 2022-04-19 DIAGNOSIS — E119 Type 2 diabetes mellitus without complications: Secondary | ICD-10-CM | POA: Diagnosis not present

## 2022-04-19 DIAGNOSIS — F332 Major depressive disorder, recurrent severe without psychotic features: Secondary | ICD-10-CM

## 2022-04-19 DIAGNOSIS — K219 Gastro-esophageal reflux disease without esophagitis: Secondary | ICD-10-CM | POA: Diagnosis not present

## 2022-04-19 MED ORDER — SUCCINYLCHOLINE CHLORIDE 200 MG/10ML IV SOSY
PREFILLED_SYRINGE | INTRAVENOUS | Status: AC
  Filled 2022-04-19: qty 10

## 2022-04-19 MED ORDER — ESMOLOL HCL 100 MG/10ML IV SOLN
INTRAVENOUS | Status: DC | PRN
  Administered 2022-04-19: 30 mg via INTRAVENOUS

## 2022-04-19 MED ORDER — LABETALOL HCL 5 MG/ML IV SOLN
INTRAVENOUS | Status: AC
  Filled 2022-04-19: qty 4

## 2022-04-19 MED ORDER — ESMOLOL HCL 100 MG/10ML IV SOLN
INTRAVENOUS | Status: AC
  Filled 2022-04-19: qty 10

## 2022-04-19 MED ORDER — SODIUM CHLORIDE 0.9 % IV SOLN
500.0000 mL | Freq: Once | INTRAVENOUS | Status: AC
  Administered 2022-04-19: 500 mL via INTRAVENOUS

## 2022-04-19 MED ORDER — METHOHEXITAL SODIUM 100 MG/10ML IV SOSY
PREFILLED_SYRINGE | INTRAVENOUS | Status: DC | PRN
  Administered 2022-04-19: 70 mg via INTRAVENOUS

## 2022-04-19 MED ORDER — LABETALOL HCL 5 MG/ML IV SOLN
INTRAVENOUS | Status: DC | PRN
  Administered 2022-04-19: 10 mg via INTRAVENOUS

## 2022-04-19 MED ORDER — SUCCINYLCHOLINE CHLORIDE 200 MG/10ML IV SOSY
PREFILLED_SYRINGE | INTRAVENOUS | Status: DC | PRN
  Administered 2022-04-19: 100 mg via INTRAVENOUS

## 2022-04-19 MED ORDER — GLYCOPYRROLATE 0.2 MG/ML IJ SOLN
INTRAMUSCULAR | Status: AC
  Administered 2022-04-19: 0.4 mg via INTRAVENOUS
  Filled 2022-04-19: qty 2

## 2022-04-19 MED ORDER — KETOROLAC TROMETHAMINE 30 MG/ML IJ SOLN: 30.0000 mg | Freq: Once | INTRAMUSCULAR | Status: AC

## 2022-04-19 MED ORDER — GLYCOPYRROLATE 0.2 MG/ML IJ SOLN: 0.4000 mg | Freq: Once | INTRAMUSCULAR | Status: AC

## 2022-04-19 MED ORDER — KETOROLAC TROMETHAMINE 30 MG/ML IJ SOLN
INTRAMUSCULAR | Status: AC
  Administered 2022-04-19: 30 mg via INTRAVENOUS
  Filled 2022-04-19: qty 1

## 2022-04-19 MED ORDER — METHOHEXITAL SODIUM 0.5 G IJ SOLR
INTRAMUSCULAR | Status: AC
  Filled 2022-04-19: qty 500

## 2022-04-19 NOTE — Anesthesia Preprocedure Evaluation (Addendum)
Anesthesia Evaluation  Patient identified by MRN, date of birth, ID band Patient awake    Reviewed: Allergy & Precautions, NPO status , Patient's Chart, lab work & pertinent test results  History of Anesthesia Complications Negative for: history of anesthetic complications  Airway Mallampati: II   Neck ROM: Full    Dental  (+) Missing   Pulmonary sleep apnea ,    Pulmonary exam normal breath sounds clear to auscultation       Cardiovascular hypertension, Pt. on medications Normal cardiovascular exam Rhythm:Regular Rate:Normal     Neuro/Psych PSYCHIATRIC DISORDERS Depression Bipolar Disorder  Neuromuscular disease (peripheral neuropathy)    GI/Hepatic GERD  Controlled,  Endo/Other  diabetes, Poorly Controlled, Type 2, Oral Hypoglycemic AgentsClass 3 obesity   Renal/GU negative Renal ROS     Musculoskeletal   Abdominal   Peds  Hematology negative hematology ROS (+)   Anesthesia Other Findings   Reproductive/Obstetrics                            Anesthesia Physical  Anesthesia Plan  ASA: 3  Anesthesia Plan: General   Post-op Pain Management:    Induction: Intravenous  PONV Risk Score and Plan: 3 and TIVA and Treatment may vary due to age or medical condition  Airway Management Planned: Natural Airway  Additional Equipment:   Intra-op Plan:   Post-operative Plan:   Informed Consent: I have reviewed the patients History and Physical, chart, labs and discussed the procedure including the risks, benefits and alternatives for the proposed anesthesia with the patient or authorized representative who has indicated his/her understanding and acceptance.     Dental advisory given  Plan Discussed with: CRNA  Anesthesia Plan Comments: (LMA/GETA backup discussed.  Patient consented for risks of anesthesia including but not limited to:  - adverse reactions to medications - damage to  eyes, teeth, lips or other oral mucosa - nerve damage due to positioning  - sore throat or hoarseness - damage to heart, brain, nerves, lungs, other parts of body or loss of life  Informed patient about role of CRNA in peri- and intra-operative care.  Patient voiced understanding.)       Anesthesia Quick Evaluation

## 2022-04-19 NOTE — Procedures (Signed)
ECT SERVICES Physician's Interval Evaluation & Treatment Note  Patient Identification: Melinda [name] MRN:  [id number] Date of Evaluation:  04/19/2022 TX #: [id number]  MADRS:   MMSE:   P.E. Findings:  No change to physical exam  Psychiatric Interval Note:  Affect and mood upbeat  Subjective:  Patient is a 51 y.o. female seen for evaluation for Electroconvulsive Therapy. No complaint  Treatment Summary:   '[]'$   Right Unilateral             '[x]'$  Bilateral   % Energy : 1.0 ms 35%   Impedance: 1010 ohms  Seizure Energy Index: 4000 V squared  Postictal Suppression Index: 68%  Seizure Concordance Index: 90%  Medications  Pre Shock: Robinul 0.4 mg Toradol 30 mg labetalol 20 mg esmolol 20 mg Brevital 70 mg succinylcholine 100 mg  Post Shock:    Seizure Duration: 40 seconds EMG 70 seconds EEG   Comments: Well tolerated no problems.  Follow up in 3 weeks  Lungs:  '[x]'$   Clear to auscultation               '[]'$  Other:   Heart:    '[x]'$   Regular rhythm             '[]'$  irregular rhythm    '[x]'$   Previous H&P reviewed, patient examined and there are NO CHANGES                 '[]'$   Previous H&P reviewed, patient examined and there are changes noted.   [name], MD 6/9/20235:16 PM

## 2022-04-19 NOTE — Transfer of Care (Signed)
Immediate Anesthesia Transfer of Care Note  Patient: Melinda [name]  Procedure(s) Performed: ECT TX  Patient Location: PACU  Anesthesia Type:General  Level of Consciousness: drowsy  Airway & Oxygen Therapy: Patient Spontanous Breathing and Patient connected to face mask oxygen  Post-op Assessment: Report given to RN and Post -op Vital signs reviewed and stable  Post vital signs: Reviewed and stable  Last Vitals:  Vitals Value Taken Time  BP 122/62 04/19/22 1205  Temp    Pulse 104 04/19/22 1207  Resp 21 04/19/22 1207  SpO2 100 % 04/19/22 1207  Vitals shown include unvalidated device data.  Last Pain:  Vitals:   04/19/22 1015  TempSrc: Tympanic  PainSc: 0-No pain         Complications: No notable events documented.

## 2022-04-19 NOTE — Anesthesia Postprocedure Evaluation (Signed)
Anesthesia Post Note  Patient: Melinda [name]  Procedure(s) Performed: ECT TX  Patient location during evaluation: PACU Anesthesia Type: General Level of consciousness: awake and alert Pain management: pain level controlled Vital Signs Assessment: post-procedure vital signs reviewed and stable Respiratory status: spontaneous breathing, nonlabored ventilation and respiratory function stable Cardiovascular status: blood pressure returned to baseline and stable Postop Assessment: no apparent nausea or vomiting Anesthetic complications: no   No notable events documented.   Last Vitals:  Vitals:   04/19/22 1246 04/19/22 1348  BP: 127/76 128/75  Pulse: 95 89  Resp: 19 18  Temp: 36.8 C 36.8 C  SpO2: 96%     Last Pain:  Vitals:   04/19/22 1348  TempSrc: Tympanic  PainSc: 0-No pain                 Iran Ouch

## 2022-04-19 NOTE — H&P (Signed)
Melinda [name] is an 51 y.o. female.   Chief Complaint: No complaint.  Mood is actually good HPI: Recurrent depression but doing much better since her grandchild came along  Past Medical History:  Diagnosis Date   Depression    Diabetes mellitus without complication ([place])    Diabetic peripheral neuropathy ([place]) 03/07/15   Diabetic peripheral neuropathy ([place]) 03/07/15   Diabetic peripheral neuropathy ([place]) 03/07/15   GERD (gastroesophageal reflux disease)    Hypercholesterolemia 03/07/15   Hypertension    Obesity 03/07/15   Personality disorder ([place]) 03/07/15   Sinus tachycardia 03/07/15   history of   Suicidal thoughts 03/07/15    Past Surgical History:  Procedure Laterality Date   COLONOSCOPY WITH PROPOFOL N/A 12/01/2018   Procedure: COLONOSCOPY WITH PROPOFOL;  Surgeon: Jonathon Bellows, MD;  Location: [facility] ENDOSCOPY;  Service: Gastroenterology;  Laterality: N/A;   electroconvulsion therapy  03/07/15    Family History  Problem Relation Age of Onset   Diabetes Mother    Hypertension Father    Breast cancer Neg Hx    Colon cancer Neg Hx    Social History:  reports that she has never smoked. She has never used smokeless tobacco. She reports that she does not drink alcohol and does not use drugs.  Allergies:  Allergies  Allergen Reactions   Prednisone     Increases blood sugar    (Not in a hospital admission)   No results found. However, due to the size of the patient record, not all encounters were searched. Please check Results Review for a complete set of results. No results found.  Review of Systems  Constitutional: Negative.   HENT: Negative.    Eyes: Negative.   Respiratory: Negative.    Cardiovascular: Negative.   Gastrointestinal: Negative.   Musculoskeletal: Negative.   Skin: Negative.   Neurological: Negative.   Psychiatric/Behavioral: Negative.      Blood pressure 128/75, pulse 89, temperature 98.3 F (36.8 C), temperature source Tympanic, resp. rate 18,  height '5\' 3"'$  (1.6 m), weight 112.9 kg, SpO2 96 %. Physical Exam Vitals and nursing note reviewed.  Constitutional:      Appearance: She is well-developed.  HENT:     Head: Normocephalic and atraumatic.  Eyes:     Conjunctiva/sclera: Conjunctivae normal.     Pupils: Pupils are equal, round, and reactive to light.  Cardiovascular:     Heart sounds: Normal heart sounds.  Pulmonary:     Effort: Pulmonary effort is normal.  Abdominal:     Palpations: Abdomen is soft.  Musculoskeletal:        General: Normal range of motion.     Cervical back: Normal range of motion.  Skin:    General: Skin is warm and dry.  Neurological:     General: No focal deficit present.     Mental Status: She is alert.  Psychiatric:        Mood and Affect: Mood normal.      Assessment/Plan Continue treatment but with a plan to gradually start weaning now that she is feeling better  [name], MD 04/19/2022, 5:15 PM

## 2022-04-19 NOTE — Anesthesia Procedure Notes (Signed)
Procedure Name: General with mask airway Date/Time: 04/19/2022 11:41 AM  Performed by: Lily Peer, Michala Deblanc, CRNAPre-anesthesia Checklist: Patient identified, Patient being monitored and Timeout performed Patient Re-evaluated:Patient Re-evaluated prior to induction Oxygen Delivery Method: Circle system utilized Preoxygenation: Pre-oxygenation with 100% oxygen Induction Type: IV induction Ventilation: Mask ventilation throughout procedure

## 2022-05-10 ENCOUNTER — Ambulatory Visit: Payer: Self-pay | Admitting: Anesthesiology

## 2022-05-10 ENCOUNTER — Other Ambulatory Visit: Payer: Self-pay | Admitting: Psychiatry

## 2022-05-10 ENCOUNTER — Encounter: Payer: Self-pay | Admitting: Anesthesiology

## 2022-05-10 ENCOUNTER — Encounter (HOSPITAL_BASED_OUTPATIENT_CLINIC_OR_DEPARTMENT_OTHER)
Admission: RE | Admit: 2022-05-10 | Discharge: 2022-05-10 | Disposition: A | Discharge status: Home / Self Care | Payer: Medicare HMO | Source: Ambulatory Visit | Attending: Psychiatry | Admitting: Psychiatry

## 2022-05-10 DIAGNOSIS — E119 Type 2 diabetes mellitus without complications: Secondary | ICD-10-CM | POA: Diagnosis not present

## 2022-05-10 DIAGNOSIS — F332 Major depressive disorder, recurrent severe without psychotic features: Secondary | ICD-10-CM

## 2022-05-10 DIAGNOSIS — E669 Obesity, unspecified: Secondary | ICD-10-CM | POA: Diagnosis not present

## 2022-05-10 LAB — GLUCOSE, CAPILLARY: Glucose-Capillary: 138 mg/dL — ABNORMAL HIGH (ref 70–99)

## 2022-05-10 MED ORDER — METHOHEXITAL SODIUM 100 MG/10ML IV SOSY
PREFILLED_SYRINGE | INTRAVENOUS | Status: DC | PRN
  Administered 2022-05-10: 70 mg via INTRAVENOUS

## 2022-05-10 MED ORDER — LABETALOL HCL 5 MG/ML IV SOLN
INTRAVENOUS | Status: DC | PRN
  Administered 2022-05-10: 10 mg via INTRAVENOUS

## 2022-05-10 MED ORDER — SUCCINYLCHOLINE CHLORIDE 200 MG/10ML IV SOSY
PREFILLED_SYRINGE | INTRAVENOUS | Status: AC
  Filled 2022-05-10: qty 20

## 2022-05-10 MED ORDER — GLYCOPYRROLATE 0.2 MG/ML IJ SOLN
INTRAMUSCULAR | Status: AC
  Administered 2022-05-10: 0.4 mg via INTRAVENOUS
  Filled 2022-05-10: qty 2

## 2022-05-10 MED ORDER — ESMOLOL HCL 100 MG/10ML IV SOLN
INTRAVENOUS | Status: DC | PRN
  Administered 2022-05-10: 30 mg via INTRAVENOUS

## 2022-05-10 MED ORDER — LABETALOL HCL 5 MG/ML IV SOLN
INTRAVENOUS | Status: AC
  Filled 2022-05-10: qty 4

## 2022-05-10 MED ORDER — LIDOCAINE HCL (PF) 2 % IJ SOLN
INTRAMUSCULAR | Status: AC
  Filled 2022-05-10: qty 5

## 2022-05-10 MED ORDER — SODIUM CHLORIDE 0.9 % IV SOLN: INTRAVENOUS | Status: DC | PRN

## 2022-05-10 MED ORDER — SODIUM CHLORIDE 0.9 % IV SOLN
500.0000 mL | Freq: Once | INTRAVENOUS | Status: AC
Start: 2022-05-10 — End: 2022-05-10
  Administered 2022-05-10: 500 mL via INTRAVENOUS

## 2022-05-10 MED ORDER — KETOROLAC TROMETHAMINE 30 MG/ML IJ SOLN: 30.0000 mg | Freq: Once | INTRAMUSCULAR | Status: AC

## 2022-05-10 MED ORDER — METHOHEXITAL SODIUM 0.5 G IJ SOLR
INTRAMUSCULAR | Status: AC
  Filled 2022-05-10: qty 500

## 2022-05-10 MED ORDER — GLYCOPYRROLATE 0.2 MG/ML IJ SOLN: 0.4000 mg | Freq: Once | INTRAMUSCULAR | Status: AC

## 2022-05-10 MED ORDER — SUCCINYLCHOLINE CHLORIDE 200 MG/10ML IV SOSY
PREFILLED_SYRINGE | INTRAVENOUS | Status: DC | PRN
  Administered 2022-05-10: 100 mg via INTRAVENOUS

## 2022-05-10 MED ORDER — ESMOLOL HCL 100 MG/10ML IV SOLN
INTRAVENOUS | Status: AC
  Filled 2022-05-10: qty 10

## 2022-05-10 MED ORDER — KETOROLAC TROMETHAMINE 30 MG/ML IJ SOLN
INTRAMUSCULAR | Status: AC
  Administered 2022-05-10: 30 mg via INTRAVENOUS
  Filled 2022-05-10: qty 1

## 2022-05-10 NOTE — Progress Notes (Signed)
Patient has reconsidered wearing a pad.

## 2022-05-10 NOTE — Anesthesia Preprocedure Evaluation (Signed)
Anesthesia Evaluation  Patient identified by MRN, date of birth, ID band Patient awake    Reviewed: Allergy & Precautions, NPO status , Patient's Chart, lab work & pertinent test results  Airway Mallampati: III  TM Distance: >3 FB Neck ROM: Full    Dental  (+) Teeth Intact   Pulmonary neg pulmonary ROS, sleep apnea ,    Pulmonary exam normal  + decreased breath sounds      Cardiovascular Exercise Tolerance: Good hypertension, Pt. on medications negative cardio ROS Normal cardiovascular exam Rhythm:Regular Rate:Normal     Neuro/Psych Depression Bipolar Disorder negative neurological ROS  negative psych ROS   GI/Hepatic negative GI ROS, Neg liver ROS, GERD  Medicated,  Endo/Other  negative endocrine ROSdiabetes, Well Controlled, Type 2, Oral Hypoglycemic AgentsMorbid obesity  Renal/GU negative Renal ROS  negative genitourinary   Musculoskeletal negative musculoskeletal ROS (+)   Abdominal (+) + obese,   Peds negative pediatric ROS (+)  Hematology negative hematology ROS (+)   Anesthesia Other Findings Past Medical History: No date: Depression No date: Diabetes mellitus without complication ([place]) 1/94/17: Diabetic peripheral neuropathy ([place]) 03/07/15: Diabetic peripheral neuropathy ([place]) 03/07/15: Diabetic peripheral neuropathy ([facility]) No date: GERD (gastroesophageal reflux disease) 03/07/15: Hypercholesterolemia No date: Hypertension 03/07/15: Obesity 03/07/15: Personality disorder ([place]) 03/07/15: Sinus tachycardia     Comment:  history of 03/07/15: Suicidal thoughts  Past Surgical History: 12/01/2018: COLONOSCOPY WITH PROPOFOL; N/A     Comment:  Procedure: COLONOSCOPY WITH PROPOFOL;  Surgeon: Jonathon Bellows, MD;  Location: [facility] ENDOSCOPY;  Service:               Gastroenterology;  Laterality: N/A; 03/07/15: electroconvulsion therapy  BMI    Body Mass Index: 44.11 kg/m       Reproductive/Obstetrics negative OB ROS                             Anesthesia Physical Anesthesia Plan  ASA: 3  Anesthesia Plan: General   Post-op Pain Management:    Induction: Intravenous  PONV Risk Score and Plan: Propofol infusion and TIVA  Airway Management Planned: Natural Airway and Mask  Additional Equipment:   Intra-op Plan:   Post-operative Plan:   Informed Consent: I have reviewed the patients History and Physical, chart, labs and discussed the procedure including the risks, benefits and alternatives for the proposed anesthesia with the patient or authorized representative who has indicated his/her understanding and acceptance.     Dental Advisory Given  Plan Discussed with: CRNA and Surgeon  Anesthesia Plan Comments:         Anesthesia Quick Evaluation

## 2022-05-10 NOTE — Procedures (Signed)
ECT SERVICES Physician's Interval Evaluation & Treatment Note  Patient Identification: Melinda [name] MRN:  [id number] Date of Evaluation:  05/10/2022 TX #: [id number]  MADRS:   MMSE:   P.E. Findings:  No change to physical exam  Psychiatric Interval Note:  No real change pretty much at her baseline  Subjective:  Patient is a 50 y.o. female seen for evaluation for Electroconvulsive Therapy. No specific complaints.  Patient talked appropriately and with good reactivity to her affect about the recent death of her aunt and her chronic concerns about her father.  Treatment Summary:   '[]'$   Right Unilateral             '[x]'$  Bilateral   % Energy : 1.0 ms 35%   Impedance: 1140 ohms  Seizure Energy Index: 8252 V squared  Postictal Suppression Index: 77%  Seizure Concordance Index: 97%  Medications  Pre Shock: Robinul 0.4 mg Toradol 30 mg labetalol 20 mg esmolol 20 mg Brevital 70 mg succinylcholine 100 mg  Post Shock:    Seizure Duration: 29 seconds EMG 78 seconds EEG   Comments: Follow-up on the regular schedule in 3 weeks  Lungs:  '[x]'$   Clear to auscultation               '[]'$  Other:   Heart:    '[x]'$   Regular rhythm             '[]'$  irregular rhythm    '[x]'$   Previous H&P reviewed, patient examined and there are NO CHANGES                 '[]'$   Previous H&P reviewed, patient examined and there are changes noted.   [name], MD 6/30/20232:57 PM

## 2022-05-10 NOTE — H&P (Signed)
Melinda [name] is an 51 y.o. female.   Chief Complaint: Since our last visit her aunt passed away and she is a little sad about that.  Back to about her baseline affect but definitely perks up talking about her grandson. HPI: History of longstanding chronic depression with benefit from maintenance ECT  Past Medical History:  Diagnosis Date   Depression    Diabetes mellitus without complication ([place])    Diabetic peripheral neuropathy ([place]) 03/07/15   Diabetic peripheral neuropathy ([place]) 03/07/15   Diabetic peripheral neuropathy ([place]) 03/07/15   GERD (gastroesophageal reflux disease)    Hypercholesterolemia 03/07/15   Hypertension    Obesity 03/07/15   Personality disorder ([place]) 03/07/15   Sinus tachycardia 03/07/15   history of   Suicidal thoughts 03/07/15    Past Surgical History:  Procedure Laterality Date   COLONOSCOPY WITH PROPOFOL N/A 12/01/2018   Procedure: COLONOSCOPY WITH PROPOFOL;  Surgeon: Jonathon Bellows, MD;  Location: [facility] ENDOSCOPY;  Service: Gastroenterology;  Laterality: N/A;   electroconvulsion therapy  03/07/15    Family History  Problem Relation Age of Onset   Diabetes Mother    Hypertension Father    Breast cancer Neg Hx    Colon cancer Neg Hx    Social History:  reports that she has never smoked. She has never used smokeless tobacco. She reports that she does not drink alcohol and does not use drugs.  Allergies:  Allergies  Allergen Reactions   Prednisone     Increases blood sugar    (Not in a hospital admission)   Results for orders placed or performed during the hospital encounter of 05/10/22 (from the past 48 hour(s))  Glucose, capillary     Status: Abnormal   Collection Time: 05/10/22 12:31 PM  Result Value Ref Range   Glucose-Capillary 138 (H) 70 - 99 mg/dL    Comment: Glucose reference range applies only to samples taken after fasting for at least 8 hours.   *Note: Due to a large number of results and/or encounters for the requested time period,  some results have not been displayed. A complete set of results can be found in Results Review.   No results found.  Review of Systems  Constitutional: Negative.   HENT: Negative.    Eyes: Negative.   Respiratory: Negative.    Cardiovascular: Negative.   Gastrointestinal: Negative.   Musculoskeletal: Negative.   Skin: Negative.   Neurological: Negative.   Psychiatric/Behavioral: Negative.    All other systems reviewed and are negative.   Blood pressure 126/75, pulse 96, temperature 97.9 F (36.6 C), temperature source Tympanic, resp. rate 16, height '5\' 3"'$  (1.6 m), weight 112.9 kg, SpO2 93 %. Physical Exam Vitals and nursing note reviewed.  Constitutional:      Appearance: She is well-developed.  HENT:     Head: Normocephalic and atraumatic.  Eyes:     Conjunctiva/sclera: Conjunctivae normal.     Pupils: Pupils are equal, round, and reactive to light.  Cardiovascular:     Heart sounds: Normal heart sounds.  Pulmonary:     Effort: Pulmonary effort is normal.  Abdominal:     Palpations: Abdomen is soft.  Musculoskeletal:        General: Normal range of motion.     Cervical back: Normal range of motion.  Skin:    General: Skin is warm and dry.  Neurological:     General: No focal deficit present.     Mental Status: She is alert.  Psychiatric:  Mood and Affect: Mood normal.        Thought Content: Thought content normal.      Assessment/Plan Treatment today with follow-up neck scheduled in 3 weeks  [name], MD 05/10/2022, 2:55 PM

## 2022-05-10 NOTE — Transfer of Care (Signed)
Immediate Anesthesia Transfer of Care Note  Patient: Melinda [name]  Procedure(s) Performed: ECT TX  Patient Location: PACU  Anesthesia Type:General  Level of Consciousness: drowsy  Airway & Oxygen Therapy: Patient Spontanous Breathing and Patient connected to nasal cannula oxygen  Post-op Assessment: Report given to RN and Post -op Vital signs reviewed and stable  Post vital signs: Reviewed and stable  Last Vitals:  Vitals Value Taken Time  BP    Temp    Pulse 117 05/10/22 1303  Resp 16 05/10/22 1303  SpO2 90 % 05/10/22 1303  Vitals shown include unvalidated device data.  Last Pain:  Vitals:   05/10/22 1130  TempSrc:   PainSc: 0-No pain         Complications: No notable events documented.

## 2022-05-10 NOTE — Anesthesia Postprocedure Evaluation (Signed)
Anesthesia Post Note  Patient: Melinda [name]  Procedure(s) Performed: ECT TX  Patient location during evaluation: PACU Anesthesia Type: General Level of consciousness: awake and oriented Pain management: pain level controlled Vital Signs Assessment: post-procedure vital signs reviewed and stable Respiratory status: spontaneous breathing and respiratory function stable Anesthetic complications: no   No notable events documented.   Last Vitals:  Vitals:   05/10/22 1315 05/10/22 1341  BP:  126/75  Pulse: (!) 107 96  Resp: (!) 28 16  Temp:  36.6 C  SpO2: 93%     Last Pain:  Vitals:   05/10/22 1341  TempSrc: Tympanic  PainSc: 0-No pain                 VAN STAVEREN,Calixto Pavel

## 2022-05-10 NOTE — Progress Notes (Signed)
Education provided regarding the need for the patient to use an in continence pad for possible occurrence of continence during the procedure. After verbal understanding patient refused the pad twice.

## 2022-05-17 ENCOUNTER — Telehealth: Payer: Self-pay | Admitting: *Deleted

## 2022-05-17 NOTE — Telephone Encounter (Signed)
[facility] sent refill request  ziprasidone (GEODON) 80 MG capsule and buPROPion (WELLBUTRIN XL) 300. Last refill 06/04/21.  Sent secure chat to Dr. Weber Cooks.

## 2022-05-21 ENCOUNTER — Other Ambulatory Visit: Payer: Self-pay | Admitting: Family Medicine

## 2022-05-21 DIAGNOSIS — E119 Type 2 diabetes mellitus without complications: Secondary | ICD-10-CM

## 2022-05-22 ENCOUNTER — Other Ambulatory Visit: Payer: Self-pay | Admitting: Family Medicine

## 2022-05-22 DIAGNOSIS — I10 Essential (primary) hypertension: Secondary | ICD-10-CM

## 2022-05-22 DIAGNOSIS — M25512 Pain in left shoulder: Secondary | ICD-10-CM

## 2022-05-28 ENCOUNTER — Other Ambulatory Visit: Payer: Self-pay | Admitting: Psychiatry

## 2022-05-28 MED ORDER — BUPROPION HCL ER (XL) 300 MG PO TB24: 300.0000 mg | ORAL_TABLET | Freq: Every day | ORAL | 1 refills | Status: DC

## 2022-05-28 MED ORDER — ZIPRASIDONE HCL 80 MG PO CAPS: ORAL_CAPSULE | ORAL | 1 refills | Status: DC

## 2022-05-28 MED ORDER — ESCITALOPRAM OXALATE 20 MG PO TABS: 20.0000 mg | ORAL_TABLET | Freq: Every day | ORAL | 1 refills | Status: DC

## 2022-05-29 DIAGNOSIS — E119 Type 2 diabetes mellitus without complications: Secondary | ICD-10-CM | POA: Diagnosis not present

## 2022-05-29 DIAGNOSIS — H524 Presbyopia: Secondary | ICD-10-CM | POA: Diagnosis not present

## 2022-05-29 DIAGNOSIS — H5213 Myopia, bilateral: Secondary | ICD-10-CM | POA: Diagnosis not present

## 2022-05-29 LAB — HM DIABETES EYE EXAM

## 2022-05-31 ENCOUNTER — Other Ambulatory Visit: Payer: Self-pay | Admitting: Psychiatry

## 2022-05-31 ENCOUNTER — Encounter: Payer: Self-pay | Admitting: Anesthesiology

## 2022-05-31 ENCOUNTER — Encounter
Admission: RE | Admit: 2022-05-31 | Discharge: 2022-05-31 | Disposition: A | Discharge status: Home / Self Care | Payer: Medicare HMO | Source: Ambulatory Visit | Attending: Psychiatry | Admitting: Psychiatry

## 2022-05-31 DIAGNOSIS — I10 Essential (primary) hypertension: Secondary | ICD-10-CM | POA: Diagnosis not present

## 2022-05-31 DIAGNOSIS — E119 Type 2 diabetes mellitus without complications: Secondary | ICD-10-CM | POA: Insufficient documentation

## 2022-05-31 DIAGNOSIS — F332 Major depressive disorder, recurrent severe without psychotic features: Secondary | ICD-10-CM | POA: Diagnosis not present

## 2022-05-31 DIAGNOSIS — F319 Bipolar disorder, unspecified: Secondary | ICD-10-CM | POA: Diagnosis not present

## 2022-05-31 DIAGNOSIS — Z7984 Long term (current) use of oral hypoglycemic drugs: Secondary | ICD-10-CM | POA: Diagnosis not present

## 2022-05-31 LAB — GLUCOSE, CAPILLARY: Glucose-Capillary: 129 mg/dL — ABNORMAL HIGH (ref 70–99)

## 2022-05-31 MED ORDER — METHOHEXITAL SODIUM 100 MG/10ML IV SOSY
PREFILLED_SYRINGE | INTRAVENOUS | Status: DC | PRN
  Administered 2022-05-31: 70 mg via INTRAVENOUS

## 2022-05-31 MED ORDER — SUCCINYLCHOLINE CHLORIDE 200 MG/10ML IV SOSY
PREFILLED_SYRINGE | INTRAVENOUS | Status: AC
  Filled 2022-05-31: qty 10

## 2022-05-31 MED ORDER — LABETALOL HCL 5 MG/ML IV SOLN
INTRAVENOUS | Status: DC | PRN
  Administered 2022-05-31: 10 mg via INTRAVENOUS

## 2022-05-31 MED ORDER — GLYCOPYRROLATE 0.2 MG/ML IJ SOLN
INTRAMUSCULAR | Status: AC
  Administered 2022-05-31: 0.4 mg via INTRAVENOUS
  Filled 2022-05-31: qty 2

## 2022-05-31 MED ORDER — KETOROLAC TROMETHAMINE 30 MG/ML IJ SOLN: 30.0000 mg | Freq: Once | INTRAMUSCULAR | Status: AC

## 2022-05-31 MED ORDER — SODIUM CHLORIDE 0.9 % IV SOLN
500.0000 mL | Freq: Once | INTRAVENOUS | Status: AC
  Administered 2022-05-31: 500 mL via INTRAVENOUS

## 2022-05-31 MED ORDER — ESMOLOL HCL 100 MG/10ML IV SOLN
INTRAVENOUS | Status: DC | PRN
  Administered 2022-05-31: 30 mg via INTRAVENOUS

## 2022-05-31 MED ORDER — LABETALOL HCL 5 MG/ML IV SOLN
INTRAVENOUS | Status: AC
  Filled 2022-05-31: qty 4

## 2022-05-31 MED ORDER — SUCCINYLCHOLINE CHLORIDE 200 MG/10ML IV SOSY
PREFILLED_SYRINGE | INTRAVENOUS | Status: DC | PRN
  Administered 2022-05-31: 100 mg via INTRAVENOUS

## 2022-05-31 MED ORDER — SODIUM CHLORIDE 0.9 % IV SOLN: INTRAVENOUS | Status: DC | PRN

## 2022-05-31 MED ORDER — KETOROLAC TROMETHAMINE 30 MG/ML IJ SOLN
INTRAMUSCULAR | Status: AC
  Administered 2022-05-31: 30 mg via INTRAVENOUS
  Filled 2022-05-31: qty 1

## 2022-05-31 MED ORDER — GLYCOPYRROLATE 0.2 MG/ML IJ SOLN: 0.4000 mg | Freq: Once | INTRAMUSCULAR | Status: AC

## 2022-05-31 MED ORDER — METHOHEXITAL SODIUM 0.5 G IJ SOLR
INTRAMUSCULAR | Status: AC
  Filled 2022-05-31: qty 500

## 2022-05-31 MED ORDER — ESMOLOL HCL 100 MG/10ML IV SOLN
INTRAVENOUS | Status: AC
  Filled 2022-05-31: qty 10

## 2022-05-31 NOTE — Progress Notes (Signed)
Education provided to the patient regarding the possibility of incontinence due to the administration of anesthesia. Recommendation was made for the patient to apply an incontinence pad to prevent soiling clothing. Patient verbalized understanding of the education and stated they choose not to apply and incontinence pad for this visit.

## 2022-05-31 NOTE — Transfer of Care (Signed)
Immediate Anesthesia Transfer of Care Note  Patient: Melinda [name]  Procedure(s) Performed: ECT TX  Patient Location: PACU  Anesthesia Type:General  Level of Consciousness: awake and alert   Airway & Oxygen Therapy: Patient Spontanous Breathing and Patient connected to face mask oxygen  Post-op Assessment: Report given to RN and Post -op Vital signs reviewed and stable  Post vital signs: Reviewed and stable  Last Vitals:  Vitals Value Taken Time  BP    Temp    Pulse 116 05/31/22 1153  Resp 23 05/31/22 1155  SpO2 100 % 05/31/22 1153  Vitals shown include unvalidated device data.  Last Pain:  Vitals:   05/31/22 1042  TempSrc:   PainSc: 0-No pain         Complications: No notable events documented.

## 2022-05-31 NOTE — Procedures (Signed)
ECT SERVICES Physician's Interval Evaluation & Treatment Note  Patient Identification: Melinda [name] MRN:  [id number] Date of Evaluation:  05/31/2022 TX #: [id number]  MADRS:   MMSE:   P.E. Findings:  No change to treatment plan  Psychiatric Interval Note:  Mood upbeat positive  Subjective:  Patient is a 51 y.o. female seen for evaluation for Electroconvulsive Therapy. No complaint  Treatment Summary:   '[]'$   Right Unilateral             '[x]'$  Bilateral   % Energy : 1.0 ms 35%   Impedance: 1130 ohms  Seizure Energy Index: 7029 V squared  Postictal Suppression Index: 18%  Seizure Concordance Index: 94%  Medications  Pre Shock: Robinul 0.4 mg Toradol 30 mg labetalol 20 mg esmolol 20 mg Brevital 70 mg succinylcholine 100 mg  Post Shock:    Seizure Duration: 37 seconds EMG 59 seconds EEG   Comments: Follow-up in 3 weeks  Lungs:  '[x]'$   Clear to auscultation               '[]'$  Other:   Heart:    '[x]'$   Regular rhythm             '[]'$  irregular rhythm    '[x]'$   Previous H&P reviewed, patient examined and there are NO CHANGES                 '[]'$   Previous H&P reviewed, patient examined and there are changes noted.   [name], MD 7/21/20238:03 PM

## 2022-05-31 NOTE — H&P (Signed)
Melinda [name] is an 51 y.o. female.   Chief Complaint: No specific complaint.  Mood improved HPI: Recurrent severe depression  Past Medical History:  Diagnosis Date   Depression    Diabetes mellitus without complication ([place])    Diabetic peripheral neuropathy ([place]) 03/07/15   Diabetic peripheral neuropathy ([place]) 03/07/15   Diabetic peripheral neuropathy ([place]) 03/07/15   GERD (gastroesophageal reflux disease)    Hypercholesterolemia 03/07/15   Hypertension    Obesity 03/07/15   Personality disorder ([place]) 03/07/15   Sinus tachycardia 03/07/15   history of   Suicidal thoughts 03/07/15    Past Surgical History:  Procedure Laterality Date   COLONOSCOPY WITH PROPOFOL N/A 12/01/2018   Procedure: COLONOSCOPY WITH PROPOFOL;  Surgeon: Jonathon Bellows, MD;  Location: [facility] ENDOSCOPY;  Service: Gastroenterology;  Laterality: N/A;   electroconvulsion therapy  03/07/15    Family History  Problem Relation Age of Onset   Diabetes Mother    Hypertension Father    Breast cancer Neg Hx    Colon cancer Neg Hx    Social History:  reports that she has never smoked. She has never used smokeless tobacco. She reports that she does not drink alcohol and does not use drugs.  Allergies:  Allergies  Allergen Reactions   Prednisone     Increases blood sugar    (Not in a hospital admission)   Results for orders placed or performed during the hospital encounter of 05/31/22 (from the past 48 hour(s))  Glucose, capillary     Status: Abnormal   Collection Time: 05/31/22 10:33 AM  Result Value Ref Range   Glucose-Capillary 129 (H) 70 - 99 mg/dL    Comment: Glucose reference range applies only to samples taken after fasting for at least 8 hours.   *Note: Due to a large number of results and/or encounters for the requested time period, some results have not been displayed. A complete set of results can be found in Results Review.   No results found.  Review of Systems  Constitutional: Negative.   HENT:  Negative.    Eyes: Negative.   Respiratory: Negative.    Cardiovascular: Negative.   Gastrointestinal: Negative.   Musculoskeletal: Negative.   Skin: Negative.   Neurological: Negative.   Psychiatric/Behavioral: Negative.      Blood pressure 112/72, pulse (!) 108, temperature 98.7 F (37.1 C), temperature source Oral, resp. rate 18, height '5\' 3"'$  (1.6 m), weight 112.9 kg, SpO2 95 %. Physical Exam Vitals reviewed.  Constitutional:      Appearance: She is well-developed.  HENT:     Head: Normocephalic and atraumatic.  Eyes:     Conjunctiva/sclera: Conjunctivae normal.     Pupils: Pupils are equal, round, and reactive to light.  Cardiovascular:     Heart sounds: Normal heart sounds.  Pulmonary:     Effort: Pulmonary effort is normal.  Abdominal:     Palpations: Abdomen is soft.  Musculoskeletal:        General: Normal range of motion.     Cervical back: Normal range of motion.  Skin:    General: Skin is warm and dry.  Neurological:     General: No focal deficit present.     Mental Status: She is alert.  Psychiatric:        Mood and Affect: Mood normal.        Thought Content: Thought content normal.      Assessment/Plan Doing so much better.  We will start working I think soon on  tapering her farther down on the ECT  [name], MD 05/31/2022, 8:01 PM

## 2022-05-31 NOTE — Anesthesia Procedure Notes (Signed)
Date/Time: 05/31/2022 11:37 AM  Performed by: Demetrius Charity, CRNAPre-anesthesia Checklist: Patient identified, Patient being monitored, Timeout performed, Emergency Drugs available and Suction available Patient Re-evaluated:Patient Re-evaluated prior to induction Oxygen Delivery Method: Circle system utilized Preoxygenation: Pre-oxygenation with 100% oxygen Ventilation: Mask ventilation without difficulty Airway Equipment and Method: Bite block Dental Injury: Teeth and Oropharynx as per pre-operative assessment

## 2022-05-31 NOTE — Anesthesia Postprocedure Evaluation (Signed)
Anesthesia Post Note  Patient: Melinda [name]  Procedure(s) Performed: ECT TX  Patient location during evaluation: PACU Anesthesia Type: General Level of consciousness: awake and alert Pain management: pain level controlled Vital Signs Assessment: post-procedure vital signs reviewed and stable Respiratory status: spontaneous breathing, nonlabored ventilation, respiratory function stable and patient connected to nasal cannula oxygen Cardiovascular status: blood pressure returned to baseline and stable Postop Assessment: no apparent nausea or vomiting Anesthetic complications: no   No notable events documented.   Last Vitals:  Vitals:   05/31/22 1220 05/31/22 1244  BP: 111/62 112/72  Pulse: (!) 108 (!) 108  Resp: 20 18  Temp:  37.1 C  SpO2: 95%     Last Pain:  Vitals:   05/31/22 1244  TempSrc: Oral  PainSc: 0-No pain                 Precious Haws Ziyonna Christner

## 2022-05-31 NOTE — Anesthesia Preprocedure Evaluation (Signed)
Anesthesia Evaluation  Patient identified by MRN, date of birth, ID band Patient awake    Reviewed: Allergy & Precautions, H&P , NPO status , Patient's Chart, lab work & pertinent test results  History of Anesthesia Complications Negative for: history of anesthetic complications  Airway Mallampati: II  TM Distance: >3 FB Neck ROM: full    Dental  (+) Poor Dentition, Chipped   Pulmonary sleep apnea , neg COPD,    Pulmonary exam normal breath sounds clear to auscultation       Cardiovascular hypertension, Pt. on medications (-) CAD and (-) Past MI negative cardio ROS Normal cardiovascular exam Rhythm:regular Rate:Normal     Neuro/Psych PSYCHIATRIC DISORDERS Depression Bipolar Disorder  Neuromuscular disease negative neurological ROS     GI/Hepatic Neg liver ROS, GERD  Controlled,  Endo/Other  diabetes, Type 2, Oral Hypoglycemic Agents  Renal/GU negative Renal ROS  negative genitourinary   Musculoskeletal   Abdominal (+) + obese,   Peds  Hematology negative hematology ROS (+)   Anesthesia Other Findings Past Medical History: No date: Depression No date: Diabetes mellitus without complication (HCC) 9/48/54: Diabetic peripheral neuropathy ([place]) 03/07/15: Diabetic peripheral neuropathy ([place]) 03/07/15: Diabetic peripheral neuropathy (HCC) No date: GERD (gastroesophageal reflux disease) 03/07/15: Hypercholesterolemia No date: Hypertension 03/07/15: Obesity 03/07/15: Personality disorder 03/07/15: Sinus tachycardia ([place])     Comment: history of 03/07/15: Suicidal thoughts Past Surgical History: 03/07/15: electroconvulsion therapy BMI    Body Mass Index:  36.44 kg/m     Reproductive/Obstetrics                             Anesthesia Physical  Anesthesia Plan  ASA: III  Anesthesia Plan: General   Post-op Pain Management:    Induction: Intravenous  PONV Risk Score and Plan: 2 and  Ondansetron  Airway Management Planned: Mask  Additional Equipment:   Intra-op Plan:   Post-operative Plan:   Informed Consent: I have reviewed the patients History and Physical, chart, labs and discussed the procedure including the risks, benefits and alternatives for the proposed anesthesia with the patient or authorized representative who has indicated his/her understanding and acceptance.     Dental Advisory Given  Plan Discussed with: CRNA and Anesthesiologist  Anesthesia Plan Comments: (Patient consented for risks of anesthesia including but not limited to:  - adverse reactions to medications - risk of airway placement if required - damage to eyes, teeth, lips or other oral mucosa - nerve damage due to positioning  - sore throat or hoarseness - Damage to heart, brain, nerves, lungs, other parts of body or loss of life  Patient voiced understanding.)        Anesthesia Quick Evaluation

## 2022-06-06 ENCOUNTER — Ambulatory Visit: Payer: Medicare HMO | Admitting: Family Medicine

## 2022-06-24 ENCOUNTER — Encounter: Payer: Self-pay | Admitting: Family Medicine

## 2022-06-24 ENCOUNTER — Ambulatory Visit (INDEPENDENT_AMBULATORY_CARE_PROVIDER_SITE_OTHER): Payer: Medicare HMO | Admitting: Family Medicine

## 2022-06-24 VITALS — BP 124/75 | HR 74 | Temp 98.0°F | Resp 16 | Wt 227.1 lb

## 2022-06-24 DIAGNOSIS — I152 Hypertension secondary to endocrine disorders: Secondary | ICD-10-CM

## 2022-06-24 DIAGNOSIS — E1165 Type 2 diabetes mellitus with hyperglycemia: Secondary | ICD-10-CM

## 2022-06-24 DIAGNOSIS — D649 Anemia, unspecified: Secondary | ICD-10-CM | POA: Diagnosis not present

## 2022-06-24 DIAGNOSIS — E1159 Type 2 diabetes mellitus with other circulatory complications: Secondary | ICD-10-CM

## 2022-06-24 NOTE — Assessment & Plan Note (Signed)
At goal. No changes today, continue current regimen. Obtaining labs.

## 2022-06-24 NOTE — Assessment & Plan Note (Signed)
Fasting CBGs at goal, will check A1c and adjust regimen as indicated. Will obtain eye exam records. UTD with foot exam, urine microalbumin.

## 2022-06-24 NOTE — Progress Notes (Signed)
   SUBJECTIVE:   CHIEF COMPLAINT / HPI:   Hypertension: - Medications: lisinopril - Compliance: yes - Checking BP at home: no - Denies any SOB, CP, vision changes, LE edema, medication SEs, or symptoms of hypotension  Diabetes, Type 2 - Last A1c 8.0 03/2022 - Medications: jardiance, metformin - Compliance: good - Checking BG at home: yes, fasting 80-100 - Eye exam: UTD, requesting records - Foot exam: UTD - Microalbumin: UTD - Statin: yes - PNA vaccine: yes - Denies symptoms of hypoglycemia   OBJECTIVE:   BP 124/75 (BP Location: Left Arm, Patient Position: Sitting, Cuff Size: Large)   Pulse 74   Temp 98 F (36.7 C) (Oral)   Resp 16   Wt 227 lb 1.6 oz (103 kg)   LMP  (LMP Unknown) Comment: pt states she has not had period in the past year  BMI 40.23 kg/m   Gen: well appearing, in NAD Card: RRR Lungs: CTAB Ext: WWP, no edema  ASSESSMENT/PLAN:   Hypertension associated with diabetes ([place]) At goal. No changes today, continue current regimen. Obtaining labs.  T2DM (type 2 diabetes mellitus) ([place]) Fasting CBGs at goal, will check A1c and adjust regimen as indicated. Will obtain eye exam records. UTD with foot exam, urine microalbumin.      Myles Gip, DO

## 2022-06-25 LAB — COMPREHENSIVE METABOLIC PANEL
ALT: 13 IU/L (ref 0–32)
AST: 14 IU/L (ref 0–40)
Albumin/Globulin Ratio: 1.6 (ref 1.2–2.2)
Albumin: 4.1 g/dL (ref 3.8–4.9)
Alkaline Phosphatase: 124 IU/L — ABNORMAL HIGH (ref 44–121)
BUN/Creatinine Ratio: 11 (ref 9–23)
BUN: 8 mg/dL (ref 6–24)
Bilirubin Total: 0.2 mg/dL (ref 0.0–1.2)
CO2: 24 mmol/L (ref 20–29)
Calcium: 9.4 mg/dL (ref 8.7–10.2)
Chloride: 103 mmol/L (ref 96–106)
Creatinine, Ser: 0.74 mg/dL (ref 0.57–1.00)
Globulin, Total: 2.5 g/dL (ref 1.5–4.5)
Glucose: 98 mg/dL (ref 70–99)
Potassium: 3.8 mmol/L (ref 3.5–5.2)
Sodium: 142 mmol/L (ref 134–144)
Total Protein: 6.6 g/dL (ref 6.0–8.5)
eGFR: 97 mL/min/{1.73_m2} (ref >59)

## 2022-06-25 LAB — CBC WITH DIFFERENTIAL/PLATELET
Basophils Absolute: 0 10*3/uL (ref 0.0–0.2)
Basos: 0 %
EOS (ABSOLUTE): 0.1 10*3/uL (ref 0.0–0.4)
Eos: 2 %
Hematocrit: 37 % (ref 34.0–46.6)
Hemoglobin: 12 g/dL (ref 11.1–15.9)
Immature Grans (Abs): 0 10*3/uL (ref 0.0–0.1)
Immature Granulocytes: 0 %
Lymphocytes Absolute: 2.6 10*3/uL (ref 0.7–3.1)
Lymphs: 37 %
MCH: 25.9 pg — ABNORMAL LOW (ref 26.6–33.0)
MCHC: 32.4 g/dL (ref 31.5–35.7)
MCV: 80 fL (ref 79–97)
Monocytes Absolute: 0.7 10*3/uL (ref 0.1–0.9)
Monocytes: 10 %
Neutrophils Absolute: 3.5 10*3/uL (ref 1.4–7.0)
Neutrophils: 51 %
Platelets: 325 10*3/uL (ref 150–450)
RBC: 4.64 x10E6/uL (ref 3.77–5.28)
RDW: 12.1 % (ref 11.7–15.4)
WBC: 7 10*3/uL (ref 3.4–10.8)

## 2022-06-25 LAB — HEMOGLOBIN A1C
Est. average glucose Bld gHb Est-mCnc: 154 mg/dL
Hgb A1c MFr Bld: 7 % — ABNORMAL HIGH (ref 4.8–5.6)

## 2022-06-25 MED ORDER — EMPAGLIFLOZIN 25 MG PO TABS: 25.0000 mg | ORAL_TABLET | Freq: Every day | ORAL | 1 refills | Status: AC

## 2022-06-25 NOTE — Addendum Note (Signed)
Addended by: Myles Gip on: 06/25/2022 04:09 PM   Modules accepted: Orders

## 2022-06-27 ENCOUNTER — Other Ambulatory Visit: Payer: Self-pay | Admitting: Psychiatry

## 2022-06-28 ENCOUNTER — Encounter
Admission: RE | Admit: 2022-06-28 | Discharge: 2022-06-28 | Disposition: A | Discharge status: Home / Self Care | Payer: Medicare HMO | Source: Ambulatory Visit | Attending: Psychiatry | Admitting: Psychiatry

## 2022-06-28 ENCOUNTER — Encounter: Payer: Self-pay | Admitting: Anesthesiology

## 2022-06-28 ENCOUNTER — Ambulatory Visit: Payer: Self-pay | Admitting: Anesthesiology

## 2022-06-28 DIAGNOSIS — E119 Type 2 diabetes mellitus without complications: Secondary | ICD-10-CM | POA: Diagnosis not present

## 2022-06-28 DIAGNOSIS — R569 Unspecified convulsions: Secondary | ICD-10-CM | POA: Diagnosis not present

## 2022-06-28 DIAGNOSIS — F332 Major depressive disorder, recurrent severe without psychotic features: Secondary | ICD-10-CM | POA: Insufficient documentation

## 2022-06-28 DIAGNOSIS — G4730 Sleep apnea, unspecified: Secondary | ICD-10-CM | POA: Diagnosis not present

## 2022-06-28 DIAGNOSIS — I10 Essential (primary) hypertension: Secondary | ICD-10-CM | POA: Diagnosis not present

## 2022-06-28 DIAGNOSIS — F314 Bipolar disorder, current episode depressed, severe, without psychotic features: Secondary | ICD-10-CM | POA: Diagnosis not present

## 2022-06-28 LAB — GLUCOSE, CAPILLARY
Glucose-Capillary: 107 mg/dL — ABNORMAL HIGH (ref 70–99)
Glucose-Capillary: 119 mg/dL — ABNORMAL HIGH (ref 70–99)

## 2022-06-28 MED ORDER — ESMOLOL HCL-SODIUM CHLORIDE 2000 MG/100ML IV SOLN
INTRAVENOUS | Status: DC | PRN
  Administered 2022-06-28: 30 mg via INTRAVENOUS

## 2022-06-28 MED ORDER — GLYCOPYRROLATE 0.2 MG/ML IJ SOLN
INTRAMUSCULAR | Status: AC
  Filled 2022-06-28: qty 2

## 2022-06-28 MED ORDER — SUCCINYLCHOLINE CHLORIDE 200 MG/10ML IV SOSY
PREFILLED_SYRINGE | INTRAVENOUS | Status: DC | PRN
  Administered 2022-06-28: 100 mg via INTRAVENOUS

## 2022-06-28 MED ORDER — SODIUM CHLORIDE 0.9 % IV SOLN: 500.0000 mL | Freq: Once | INTRAVENOUS | Status: AC

## 2022-06-28 MED ORDER — LABETALOL HCL 5 MG/ML IV SOLN
INTRAVENOUS | Status: DC | PRN
  Administered 2022-06-28: 10 mg via INTRAVENOUS

## 2022-06-28 MED ORDER — KETOROLAC TROMETHAMINE 30 MG/ML IJ SOLN
30.0000 mg | Freq: Once | INTRAMUSCULAR | Status: AC
  Administered 2022-06-28: 30 mg via INTRAVENOUS

## 2022-06-28 MED ORDER — KETOROLAC TROMETHAMINE 30 MG/ML IJ SOLN
INTRAMUSCULAR | Status: AC
  Filled 2022-06-28: qty 1

## 2022-06-28 MED ORDER — GLYCOPYRROLATE 0.2 MG/ML IJ SOLN
0.4000 mg | Freq: Once | INTRAMUSCULAR | Status: AC
  Administered 2022-06-28: 0.4 mg via INTRAVENOUS

## 2022-06-28 NOTE — Anesthesia Postprocedure Evaluation (Signed)
Anesthesia Post Note  Patient: Melinda [name]  Procedure(s) Performed: ECT TX  Patient location during evaluation: PACU Anesthesia Type: General Level of consciousness: awake and alert Pain management: pain level controlled Vital Signs Assessment: post-procedure vital signs reviewed and stable Respiratory status: spontaneous breathing, nonlabored ventilation, respiratory function stable and patient connected to nasal cannula oxygen Cardiovascular status: blood pressure returned to baseline and stable Postop Assessment: no apparent nausea or vomiting Anesthetic complications: no   No notable events documented.   Last Vitals:  Vitals:   06/28/22 1309 06/28/22 1310  BP: (!) 147/87 (!) 147/87  Pulse: (!) 103 (!) 102  Resp: 20 (!) 23  Temp: 36.6 C 36.6 C  SpO2:  96%    Last Pain:  Vitals:   06/28/22 1310  TempSrc:   PainSc: 0-No pain                 Precious Haws Sarye Kath

## 2022-06-28 NOTE — Anesthesia Preprocedure Evaluation (Signed)
Anesthesia Evaluation  Patient identified by MRN, date of birth, ID band Patient awake    Reviewed: Allergy & Precautions, H&P , NPO status , Patient's Chart, lab work & pertinent test results  History of Anesthesia Complications Negative for: history of anesthetic complications  Airway Mallampati: II  TM Distance: >3 FB Neck ROM: full    Dental  (+) Poor Dentition, Chipped   Pulmonary sleep apnea , neg COPD,    Pulmonary exam normal breath sounds clear to auscultation       Cardiovascular hypertension, Pt. on medications (-) CAD and (-) Past MI negative cardio ROS Normal cardiovascular exam Rhythm:regular Rate:Normal     Neuro/Psych PSYCHIATRIC DISORDERS Depression Bipolar Disorder  Neuromuscular disease negative neurological ROS     GI/Hepatic Neg liver ROS, GERD  Controlled,  Endo/Other  diabetes, Type 2, Oral Hypoglycemic Agents  Renal/GU negative Renal ROS  negative genitourinary   Musculoskeletal   Abdominal (+) + obese,   Peds  Hematology negative hematology ROS (+)   Anesthesia Other Findings Past Medical History: No date: Depression No date: Diabetes mellitus without complication (HCC) 9/51/88: Diabetic peripheral neuropathy ([place]) 03/07/15: Diabetic peripheral neuropathy ([place]) 03/07/15: Diabetic peripheral neuropathy (HCC) No date: GERD (gastroesophageal reflux disease) 03/07/15: Hypercholesterolemia No date: Hypertension 03/07/15: Obesity 03/07/15: Personality disorder 03/07/15: Sinus tachycardia ([place])     Comment: history of 03/07/15: Suicidal thoughts Past Surgical History: 03/07/15: electroconvulsion therapy BMI    Body Mass Index:  36.44 kg/m     Reproductive/Obstetrics                             Anesthesia Physical  Anesthesia Plan  ASA: III  Anesthesia Plan: General   Post-op Pain Management:    Induction: Intravenous  PONV Risk Score and Plan: 2 and  Ondansetron  Airway Management Planned: Mask  Additional Equipment:   Intra-op Plan:   Post-operative Plan:   Informed Consent: I have reviewed the patients History and Physical, chart, labs and discussed the procedure including the risks, benefits and alternatives for the proposed anesthesia with the patient or authorized representative who has indicated his/her understanding and acceptance.     Dental Advisory Given  Plan Discussed with: CRNA and Anesthesiologist  Anesthesia Plan Comments: (Patient consented for risks of anesthesia including but not limited to:  - adverse reactions to medications - risk of airway placement if required - damage to eyes, teeth, lips or other oral mucosa - nerve damage due to positioning  - sore throat or hoarseness - Damage to heart, brain, nerves, lungs, other parts of body or loss of life  Patient voiced understanding.)        Anesthesia Quick Evaluation

## 2022-06-28 NOTE — Transfer of Care (Signed)
Immediate Anesthesia Transfer of Care Note  Patient: Melinda [name]  Procedure(s) Performed: ECT TX  Patient Location: PACU  Anesthesia Type:General  Level of Consciousness: drowsy and patient cooperative  Airway & Oxygen Therapy: Patient Spontanous Breathing  Post-op Assessment: Report given to RN and Post -op Vital signs reviewed and stable  Post vital signs: Reviewed and stable  Last Vitals:  Vitals Value Taken Time  BP 156/101 06/28/22 1256  Temp 36.8 C 06/28/22 1256  Pulse 109 06/28/22 1256  Resp 15 06/28/22 1256  SpO2 92 % 06/28/22 1256    Last Pain:  Vitals:   06/28/22 1256  TempSrc:   PainSc: Asleep         Complications: No notable events documented.

## 2022-06-28 NOTE — Procedures (Signed)
ECT SERVICES Physician's Interval Evaluation & Treatment Note  Patient Identification: Melinda [name] MRN:  [id number] Date of Evaluation:  06/28/2022 TX #: [id number]  MADRS:   MMSE:   P.E. Findings:  No change to physical exam  Psychiatric Interval Note:  Stable not depressed not suicidal  Subjective:  Patient is a 51 y.o. female seen for evaluation for Electroconvulsive Therapy. No complaint  Treatment Summary:   '[]'$   Right Unilateral             '[x]'$  Bilateral   % Energy : 1.0 ms 35%   Impedance: 1480 ohms  Seizure Energy Index: No reading  Postictal Suppression Index: No reading  Seizure Concordance Index: 92%  Medications  Pre Shock: Robinul 0.4 mg Toradol 30 mg Brevital 70 mg succinylcholine 100 mg esmolol 30 mg  Post Shock:    Seizure Duration: 33 seconds EMG 90 seconds EEG   Comments: Next treatment 4 weeks  Lungs:  '[x]'$   Clear to auscultation               '[]'$  Other:   Heart:    '[x]'$   Regular rhythm             '[]'$  irregular rhythm    '[x]'$   Previous H&P reviewed, patient examined and there are NO CHANGES                 '[]'$   Previous H&P reviewed, patient examined and there are changes noted.   [name], MD 8/18/20234:20 PM

## 2022-06-28 NOTE — H&P (Signed)
Melinda [name] is an 51 y.o. female.   Chief Complaint: No complaint HPI: Stable  Past Medical History:  Diagnosis Date   Depression    Diabetes mellitus without complication ([place])    Diabetic peripheral neuropathy ([place]) 03/07/15   Diabetic peripheral neuropathy ([place]) 03/07/15   Diabetic peripheral neuropathy ([place]) 03/07/15   GERD (gastroesophageal reflux disease)    Hypercholesterolemia 03/07/15   Hypertension    Obesity 03/07/15   Personality disorder ([place]) 03/07/15   Sinus tachycardia 03/07/15   history of   Suicidal thoughts 03/07/15    Past Surgical History:  Procedure Laterality Date   COLONOSCOPY WITH PROPOFOL N/A 12/01/2018   Procedure: COLONOSCOPY WITH PROPOFOL;  Surgeon: Jonathon Bellows, MD;  Location: [facility] ENDOSCOPY;  Service: Gastroenterology;  Laterality: N/A;   electroconvulsion therapy  03/07/15    Family History  Problem Relation Age of Onset   Diabetes Mother    Hypertension Father    Breast cancer Neg Hx    Colon cancer Neg Hx    Social History:  reports that she has never smoked. She has never used smokeless tobacco. She reports that she does not drink alcohol and does not use drugs.  Allergies:  Allergies  Allergen Reactions   Prednisone     Increases blood sugar    (Not in a hospital admission)   Results for orders placed or performed during the hospital encounter of 06/28/22 (from the past 48 hour(s))  Glucose, capillary     Status: Abnormal   Collection Time: 06/28/22 11:44 AM  Result Value Ref Range   Glucose-Capillary 107 (H) 70 - 99 mg/dL    Comment: Glucose reference range applies only to samples taken after fasting for at least 8 hours.   Comment 1 Notify RN    Comment 2 Document in Chart   Glucose, capillary     Status: Abnormal   Collection Time: 06/28/22  1:01 PM  Result Value Ref Range   Glucose-Capillary 119 (H) 70 - 99 mg/dL    Comment: Glucose reference range applies only to samples taken after fasting for at least 8 hours.   *Note:  Due to a large number of results and/or encounters for the requested time period, some results have not been displayed. A complete set of results can be found in Results Review.   No results found.  Review of Systems  Constitutional: Negative.   HENT: Negative.    Eyes: Negative.   Respiratory: Negative.    Cardiovascular: Negative.   Gastrointestinal: Negative.   Musculoskeletal: Negative.   Skin: Negative.   Neurological: Negative.   Psychiatric/Behavioral: Negative.    All other systems reviewed and are negative.   Blood pressure (!) 145/82, pulse 100, temperature 97.8 F (36.6 C), temperature source Oral, resp. rate 20, SpO2 96 %. Physical Exam Vitals reviewed.  Constitutional:      Appearance: She is well-developed.  HENT:     Head: Normocephalic and atraumatic.  Eyes:     Conjunctiva/sclera: Conjunctivae normal.     Pupils: Pupils are equal, round, and reactive to light.  Cardiovascular:     Heart sounds: Normal heart sounds.  Pulmonary:     Effort: Pulmonary effort is normal.  Abdominal:     Palpations: Abdomen is soft.  Musculoskeletal:        General: Normal range of motion.     Cervical back: Normal range of motion.  Skin:    General: Skin is warm and dry.  Neurological:     General:  No focal deficit present.     Mental Status: She is alert.  Psychiatric:        Mood and Affect: Mood normal.      Assessment/Plan Chronic depression ECT now out to every 4 weeks  [name], MD 06/28/2022, 4:19 PM

## 2022-07-10 ENCOUNTER — Telehealth: Payer: Self-pay

## 2022-07-10 NOTE — Telephone Encounter (Unsigned)
Copied from [place] [id number]. Topic: General - Other >> Jul 10, 2022  4:47 PM Everette C wrote: Reason for CRM: The patient has been directed to contact their PCP and request completion of a prior authorization and tier exception for their empagliflozin (JARDIANCE) 25 MG TABS tablet [[id number]]   Please contact [phone] to complete the prior authorization

## 2022-07-11 NOTE — Telephone Encounter (Signed)
PA was created and sent.

## 2022-07-12 ENCOUNTER — Telehealth: Payer: Self-pay | Admitting: *Deleted

## 2022-07-12 NOTE — Telephone Encounter (Signed)
Patient is calling to report she is still taking 10 mg Jardiance (2 tablets) daily for total of 20 mg. Patient states [employer] informed her that her insurance needed prior authorization and tier exception for new dosing. Patient has filed appeal - but would like office to reach out to Appeal dept [phone] to make sure all has been done for this dosing. Patient also wanted to clarify that she is to take the 25 mg daily when she is able to get it- advised yes per last lab note.  Patient states she will run out of 10 mg tablets soon since she is taking the double dose.

## 2022-07-12 NOTE — Telephone Encounter (Signed)
She says Dr. Ky Barban told her to take 2 a day of the 10 MG tablets. Call back request

## 2022-07-12 NOTE — Telephone Encounter (Signed)
Spoke with patient and advised the PA is still under review on CoverMyMeds, but I would reach out to the appeal dept and see what they need from [place].

## 2022-07-12 NOTE — Telephone Encounter (Signed)
$'25MG'V$  has been stated on the Prior Authorization however she has been receiving 10 Mg. Pt wants to do an appeal please advise   Best contact: [phone]

## 2022-07-17 ENCOUNTER — Ambulatory Visit: Payer: Self-pay

## 2022-07-17 ENCOUNTER — Telehealth: Payer: Self-pay

## 2022-07-17 NOTE — Telephone Encounter (Signed)
PA was sent last week. Still waiting on response.

## 2022-07-17 NOTE — Telephone Encounter (Signed)
Noted  

## 2022-07-17 NOTE — Telephone Encounter (Signed)
Patient states she started experiencing this today when she swallows she feels like it will come out of her nose but it does not seeking clinical advice.    Chief Complaint: When swallowing, feels like will come through her nostrils. Liquids and solids. Will monitor how she feels. Asking for samples of Jardiance. Symptoms: Above Frequency: Today Pertinent Negatives: Patient denies any pain Disposition: '[]'$ ED /'[]'$ Urgent Care (no appt availability in office) / '[]'$ Appointment(In office/virtual)/ '[]'$  [place] [facility]/ '[]'$ Home Care/ '[]'$ Refused Recommended Disposition /'[]'$ [place] [facility]/ '[x]'$  Follow-up with PCP Additional Notes: Please let pt. Know if there are samples of Jardiance  Answer Assessment - Initial Assessment Questions 1. DESCRIPTION: "Tell me more about this problem." "Are you  having trouble swallowing liquids, solids, or both?" "Any trouble with swallowing saliva (spit)?"     When she swallows feels like it is coming out of her nostrils 2. SEVERITY: "How bad is the swallowing difficulty?"  (e.g., Scale 1-10; or mild, moderate, severe)   - MILD (0-3): Occasional swallowing difficulty; has trouble swallowing certain types of foods or liquids.   - MODERATE (4-7): Frequent swallowing difficulty; only able to swallow small amounts of foods and fluids.   - SEVERE (8-10): Unable to swallow any foods, fluids, or saliva; sensation of "lump in throat" or "something stuck in throat", and frequent drooling or spitting may be present.     Mild 3. ONSET: "When did the swallowing problems begin?"      Today 4. CAUSE: "What do you think is causing the problem?"  (e.g., dry mouth, food or pill stuck in throat, mouth pain, sore throat, progression of disease process such as dementia or Parkinson's disease).      Unsure 5. CHRONIC or RECURRENT: "Is this a new problem for you?"  If No, ask: "How long have you had this problem?" (e.g., days, weeks, months)      Yes 6. OTHER SYMPTOMS: "Do you have  any other symptoms?" (e.g., chest pain, difficulty breathing, mouth sores, sore throat, swollen tongue, chest pain)     None 7. PREGNANCY: "Is there any chance you are pregnant?" "When was your last menstrual period?"     No  Protocols used: Swallowing Difficulty-A-AH

## 2022-07-17 NOTE — Telephone Encounter (Signed)
Copied from [place] [id number]. Topic: General - Other >> Jul 17, 2022  2:33 PM Everette C wrote: Reason for CRM: The patient has called for an update on their previously requested appeal of their  empagliflozin (JARDIANCE) 25 MG TABS tablet [[id number]] change  Please contact the patient with additional information when possible

## 2022-07-22 ENCOUNTER — Ambulatory Visit: Payer: Self-pay

## 2022-07-22 ENCOUNTER — Telehealth: Payer: Self-pay

## 2022-07-22 DIAGNOSIS — E1165 Type 2 diabetes mellitus with hyperglycemia: Secondary | ICD-10-CM

## 2022-07-22 NOTE — Telephone Encounter (Signed)
Pt asked if she can pick up samples of Jardiance. Pt stated she cannot afford 125.00 for a 90 day supply. Advised pt will forward to office.

## 2022-07-22 NOTE — Telephone Encounter (Signed)
Summary: cold sx   Pt has had runny nose, cough for over a week.  Would like to know if OK to take cold medicine or what can she take w/ meds she is currently on.     No fever, SOB Nose stuffy Coughing- small amount phlegm  Runny nose  Chief Complaint: cough, runny nose Symptoms: productive cough with clear secretions Frequency: over a week Pertinent Negatives: Patient denies SOB fever Disposition: '[]'$ ED /'[]'$ Urgent Care (no appt availability in office) / '[]'$ Appointment(In office/virtual)/ '[]'$  [place] [facility]/ '[x]'$ Home Care/ '[]'$ Refused Recommended Disposition /'[]'$ [place] [facility]/ '[]'$  Follow-up with PCP Additional Notes: pt warm transferred to [facility] for advice if Nyquil and potential drug interactions.  Reason for Disposition  Cough with cold symptoms (e.g., runny nose, postnasal drip, throat clearing)  Answer Assessment - Initial Assessment Questions 1. ONSET: "When did the cough begin?"      1 week ago 2. SEVERITY: "How bad is the cough today?"      Freq. Cough- coughing small amount of phlegm (clear) 3. SPUTUM: "Describe the color of your sputum" (none, dry cough; clear, white, yellow, green)     clear 4. HEMOPTYSIS: "Are you coughing up any blood?" If so ask: "How much?" (flecks, streaks, tablespoons, etc.)     no 5. DIFFICULTY BREATHING: "Are you having difficulty breathing?" If Yes, ask: "How bad is it?" (e.g., mild, moderate, severe)    - MILD: No SOB at rest, mild SOB with walking, speaks normally in sentences, can lie down, no retractions, pulse < 100.    - MODERATE: SOB at rest, SOB with minimal exertion and prefers to sit, cannot lie down flat, speaks in phrases, mild retractions, audible wheezing, pulse 100-120.    - SEVERE: Very SOB at rest, speaks in single words, struggling to breathe, sitting hunched forward, retractions, pulse > 120      normal 6. FEVER: "Do you have a fever?" If Yes, ask: "What is your temperature, how was it measured, and when  did it start?"     no 7. CARDIAC HISTORY: "Do you have any history of heart disease?" (e.g., heart attack, congestive heart failure)      HTN 8. LUNG HISTORY: "Do you have any history of lung disease?"  (e.g., pulmonary embolus, asthma, emphysema)     no 9. PE RISK FACTORS: "Do you have a history of blood clots?" (or: recent major surgery, recent prolonged travel, bedridden)     N/a 10. OTHER SYMPTOMS: "Do you have any other symptoms?" (e.g., runny nose, wheezing, chest pain)       Runny nose, nasal congestion 11. PREGNANCY: "Is there any chance you are pregnant?" "When was your last menstrual period?"       N/a 12. TRAVEL: "Have you traveled out of the country in the last month?" (e.g., travel history, exposures)       N/a  Protocols used: Cough - Acute Productive-A-AH

## 2022-07-23 NOTE — Addendum Note (Signed)
Addended by: Myles Gip on: 07/23/2022 12:29 PM   Modules accepted: Orders

## 2022-07-24 ENCOUNTER — Telehealth: Payer: Self-pay | Admitting: Pharmacist

## 2022-07-24 NOTE — Progress Notes (Signed)
[facility])  [place] Team    07/25/2022  Melinda [name] [birth year] [id number]  Reason for referral: Medication Assistance with Jardiance   Referral source: Dr. Argentina Donovan Current insurance: [facility]  PMHx includes but not limited to:  Type 2 diabetes mellitus with hyperglycemia, without long-term current use of insulin  Outreach:  Successful telephone call with Ms. Melinda [name].  HIPAA identifiers verified.   Objective: The 10-year ASCVD risk score (Arnett DK, et al., 2019) is: 13.4%   Values used to calculate the score:     Age: 51 years     Sex: Female     Is Non-Hispanic African American: Yes     Diabetic: Yes     Tobacco smoker: No     Systolic Blood Pressure: 465 mmHg     Is BP treated: Yes     HDL Cholesterol: 46 mg/dL     Total Cholesterol: 136 mg/dL  Lab Results  Component Value Date   CREATININE 0.74 06/24/2022   CREATININE 0.64 12/06/2021   CREATININE 0.79 03/15/2021    Lab Results  Component Value Date   HGBA1C 7.0 (H) 06/24/2022    Lipid Panel     Component Value Date/Time   CHOL 136 03/21/2022 0850   TRIG 102 03/21/2022 0850   HDL 46 03/21/2022 0850   CHOLHDL 3.0 03/21/2022 0850   LDLCALC 71 03/21/2022 0850    BP Readings from Last 3 Encounters:  06/28/22 (!) 145/82  06/24/22 124/75  05/31/22 112/72    Allergies  Allergen Reactions   Prednisone     Increases blood sugar   Assessment: Discussed patient assistance program offered through [place] with patient and reviewed household income threshold for qualification. Based on patient's reported income, she would qualify for the Medicare Extra Help Program or Low Income Subsidy (LIS). Patient gave verbal permission to complete the application with me over the phone and this was successfully completed and submitted on 07/24/2022. A copy of the proof of submission was sent to the patient for her records.   Will go ahead and apply to [place]  in the meantime for the patient assistance while we await the determination of the Medicare Extra help program.   Medication Assistance Findings:  Medication assistance needs identified: Jardiance   Extra Help:  May be eligible for   Extra Help Low Income Subsidy based on reported income and assets  Patient Assistance Programs: Jardiance made by [employer] requirement met:  Reported income is below threshold for program, potential eligibility for LIS Out-of-pocket prescription expenditure met:   Not Applicable Reviewed program requirements with patient.   Plan: Assisted patient with applying for Extra Help.  Will route note to Dr. Argentina Donovan.  Will follow-up in 2 weeks. Patient has been made aware that the social security administration will notify her of the decision for Extra Help by mail within the next 30 to 45 days.  Instructed patient to contact me if she receives notification prior to me following up with her.  I will route patient assistance letter to [place] technician who will coordinate patient assistance program application process for medications listed above.  [facility] pharmacy technician will assist with obtaining all required documents from both patient and provider(s) and submit application(s) once completed.   Loretha Brasil, PharmD [place] Pharmacist Office: [phone]

## 2022-07-26 ENCOUNTER — Ambulatory Visit
Admission: RE | Admit: 2022-07-26 | Discharge: 2022-07-26 | Disposition: A | Discharge status: Home / Self Care | Payer: Medicare HMO | Source: Ambulatory Visit | Attending: Psychiatry | Admitting: Psychiatry

## 2022-07-26 ENCOUNTER — Ambulatory Visit: Payer: Self-pay | Admitting: Certified Registered"

## 2022-07-26 ENCOUNTER — Other Ambulatory Visit: Payer: Self-pay | Admitting: Psychiatry

## 2022-07-26 ENCOUNTER — Encounter: Payer: Self-pay | Admitting: Certified Registered"

## 2022-07-26 DIAGNOSIS — E119 Type 2 diabetes mellitus without complications: Secondary | ICD-10-CM | POA: Diagnosis not present

## 2022-07-26 DIAGNOSIS — E1140 Type 2 diabetes mellitus with diabetic neuropathy, unspecified: Secondary | ICD-10-CM | POA: Diagnosis not present

## 2022-07-26 DIAGNOSIS — G4730 Sleep apnea, unspecified: Secondary | ICD-10-CM | POA: Diagnosis not present

## 2022-07-26 DIAGNOSIS — F332 Major depressive disorder, recurrent severe without psychotic features: Secondary | ICD-10-CM | POA: Diagnosis not present

## 2022-07-26 DIAGNOSIS — F339 Major depressive disorder, recurrent, unspecified: Secondary | ICD-10-CM | POA: Diagnosis not present

## 2022-07-26 DIAGNOSIS — I10 Essential (primary) hypertension: Secondary | ICD-10-CM | POA: Diagnosis not present

## 2022-07-26 LAB — GLUCOSE, CAPILLARY: Glucose-Capillary: 113 mg/dL — ABNORMAL HIGH (ref 70–99)

## 2022-07-26 MED ORDER — SODIUM CHLORIDE 0.9 % IV SOLN
500.0000 mL | Freq: Once | INTRAVENOUS | Status: AC
  Administered 2022-07-26: 500 mL via INTRAVENOUS

## 2022-07-26 MED ORDER — SUCCINYLCHOLINE CHLORIDE 200 MG/10ML IV SOSY
PREFILLED_SYRINGE | INTRAVENOUS | Status: DC | PRN
  Administered 2022-07-26: 100 mg via INTRAVENOUS

## 2022-07-26 MED ORDER — GLYCOPYRROLATE 0.2 MG/ML IJ SOLN: 0.4000 mg | Freq: Once | INTRAMUSCULAR | Status: AC

## 2022-07-26 MED ORDER — LABETALOL HCL 5 MG/ML IV SOLN
INTRAVENOUS | Status: DC | PRN
  Administered 2022-07-26: 10 mg via INTRAVENOUS

## 2022-07-26 MED ORDER — SODIUM CHLORIDE 0.9 % IV SOLN: INTRAVENOUS | Status: DC | PRN

## 2022-07-26 MED ORDER — METHOHEXITAL SODIUM 100 MG/10ML IV SOSY
PREFILLED_SYRINGE | INTRAVENOUS | Status: DC | PRN
  Administered 2022-07-26: 70 mg via INTRAVENOUS

## 2022-07-26 MED ORDER — GLYCOPYRROLATE 0.2 MG/ML IJ SOLN
INTRAMUSCULAR | Status: AC
  Administered 2022-07-26: 0.4 mg via INTRAVENOUS
  Filled 2022-07-26: qty 2

## 2022-07-26 MED ORDER — KETOROLAC TROMETHAMINE 30 MG/ML IJ SOLN: 30.0000 mg | Freq: Once | INTRAMUSCULAR | Status: AC

## 2022-07-26 MED ORDER — ESMOLOL HCL 100 MG/10ML IV SOLN
INTRAVENOUS | Status: DC | PRN
  Administered 2022-07-26: 30 ug via INTRAVENOUS

## 2022-07-26 MED ORDER — KETOROLAC TROMETHAMINE 30 MG/ML IJ SOLN
INTRAMUSCULAR | Status: AC
  Administered 2022-07-26: 30 mg via INTRAVENOUS
  Filled 2022-07-26: qty 1

## 2022-07-26 NOTE — Anesthesia Postprocedure Evaluation (Signed)
Anesthesia Post Note  Patient: Melinda [name]  Procedure(s) Performed: ECT TX  Patient location during evaluation: PACU Anesthesia Type: General Level of consciousness: awake and sedated Pain management: pain level controlled Vital Signs Assessment: post-procedure vital signs reviewed and stable Respiratory status: spontaneous breathing and respiratory function stable Cardiovascular status: stable Anesthetic complications: no   No notable events documented.   Last Vitals:  Vitals:   07/26/22 1300 07/26/22 1310  BP: 130/84 129/84  Pulse: 97 93  Resp: (!) 24 20  Temp:    SpO2: 93% 96%    Last Pain:  Vitals:   07/26/22 1310  TempSrc:   PainSc: 0-No pain                 VAN STAVEREN,Ples Trudel

## 2022-07-26 NOTE — Transfer of Care (Signed)
Immediate Anesthesia Transfer of Care Note  Patient: Melinda [name]  Procedure(s) Performed: ECT TX  Patient Location: PACU  Anesthesia Type:General  Level of Consciousness: drowsy  Airway & Oxygen Therapy: Patient Spontanous Breathing and Patient connected to nasal cannula oxygen  Post-op Assessment: Report given to RN  Post vital signs: stable  Last Vitals:  Vitals Value Taken Time  BP    Temp    Pulse    Resp    SpO2      Last Pain:  Vitals:   07/26/22 1049  TempSrc: Oral  PainSc: 0-No pain         Complications: No notable events documented.

## 2022-07-26 NOTE — Procedures (Signed)
ECT SERVICES Physician's Interval Evaluation & Treatment Note  Patient Identification: Melinda [name] MRN:  [id number] Date of Evaluation:  07/26/2022 TX #: [id number]  MADRS:   MMSE:   P.E. Findings:  No change to physical exam  Psychiatric Interval Note:  Smiling upbeat stable seems to be doing quite well  Subjective:  Patient is a 51 y.o. female seen for evaluation for Electroconvulsive Therapy. No new complaint  Treatment Summary:   '[]'$   Right Unilateral             '[x]'$  Bilateral   % Energy : 1.0 ms 35%   Impedance: 1280 ohms  Seizure Energy Index: 18,343 V squared  Postictal Suppression Index: 69%  Seizure Concordance Index: 93%  Medications  Pre Shock: Robinul 0.4 mg Toradol 30 mg labetalol 20 mg esmolol 20 mg Brevital 70 mg succinylcholine 100 mg  Post Shock:    Seizure Duration: 41 seconds EMG 63 seconds EEG   Comments: Follow-up in 4 weeks  Lungs:  '[x]'$   Clear to auscultation               '[]'$  Other:   Heart:    '[x]'$   Regular rhythm             '[]'$  irregular rhythm    '[x]'$   Previous H&P reviewed, patient examined and there are NO CHANGES                 '[]'$   Previous H&P reviewed, patient examined and there are changes noted.   [name], MD 9/15/20232:20 PM

## 2022-07-26 NOTE — Anesthesia Preprocedure Evaluation (Signed)
Anesthesia Evaluation  Patient identified by MRN, date of birth, ID band Patient awake    Reviewed: Allergy & Precautions, NPO status , Patient's Chart, lab work & pertinent test results  Airway Mallampati: II  TM Distance: >3 FB Neck ROM: Full    Dental  (+) Teeth Intact   Pulmonary neg pulmonary ROS, sleep apnea ,    Pulmonary exam normal  + decreased breath sounds      Cardiovascular Exercise Tolerance: Good hypertension, Pt. on medications negative cardio ROS Normal cardiovascular exam Rhythm:Regular     Neuro/Psych Depression negative neurological ROS  negative psych ROS   GI/Hepatic negative GI ROS, Neg liver ROS, GERD  Medicated,  Endo/Other  negative endocrine ROSdiabetes, Type 2, Oral Hypoglycemic AgentsHypothyroidism Morbid obesity  Renal/GU negative Renal ROS  negative genitourinary   Musculoskeletal   Abdominal (+) + obese,   Peds negative pediatric ROS (+)  Hematology negative hematology ROS (+)   Anesthesia Other Findings Past Medical History: No date: Depression No date: Diabetes mellitus without complication ([place]) 06/20/16: Diabetic peripheral neuropathy ([name]) 03/07/15: Diabetic peripheral neuropathy ([place]) 03/07/15: Diabetic peripheral neuropathy (HCC) No date: GERD (gastroesophageal reflux disease) 03/07/15: Hypercholesterolemia No date: Hypertension 03/07/15: Obesity 03/07/15: Personality disorder ([place]) 03/07/15: Sinus tachycardia     Comment:  history of 03/07/15: Suicidal thoughts  Past Surgical History: 12/01/2018: COLONOSCOPY WITH PROPOFOL; N/A     Comment:  Procedure: COLONOSCOPY WITH PROPOFOL;  Surgeon: Jonathon Bellows, MD;  Location: [facility] ENDOSCOPY;  Service:               Gastroenterology;  Laterality: N/A; 03/07/15: electroconvulsion therapy  BMI    Body Mass Index: 36.61 kg/m      Reproductive/Obstetrics negative OB ROS                              Anesthesia Physical Anesthesia Plan  ASA: 3  Anesthesia Plan: General   Post-op Pain Management:    Induction: Intravenous  PONV Risk Score and Plan: Propofol infusion and TIVA  Airway Management Planned: Natural Airway and Mask  Additional Equipment:   Intra-op Plan:   Post-operative Plan:   Informed Consent: I have reviewed the patients History and Physical, chart, labs and discussed the procedure including the risks, benefits and alternatives for the proposed anesthesia with the patient or authorized representative who has indicated his/her understanding and acceptance.     Dental Advisory Given  Plan Discussed with: CRNA and Surgeon  Anesthesia Plan Comments:         Anesthesia Quick Evaluation

## 2022-07-26 NOTE — H&P (Signed)
Melinda [name] is an 51 y.o. female.   Chief Complaint: Patient has no new complaint.  She did have a cold last week but that is resolved.  Mood is upbeat. HPI: Recurrent depression with good response to maintenance ECT seems to be doing quite well recently and is tolerating the decrease in frequency to every 4 weeks  Past Medical History:  Diagnosis Date   Depression    Diabetes mellitus without complication ([place])    Diabetic peripheral neuropathy ([place]) 03/07/15   Diabetic peripheral neuropathy ([place]) 03/07/15   Diabetic peripheral neuropathy ([place]) 03/07/15   GERD (gastroesophageal reflux disease)    Hypercholesterolemia 03/07/15   Hypertension    Obesity 03/07/15   Personality disorder ([place]) 03/07/15   Sinus tachycardia 03/07/15   history of   Suicidal thoughts 03/07/15    Past Surgical History:  Procedure Laterality Date   COLONOSCOPY WITH PROPOFOL N/A 12/01/2018   Procedure: COLONOSCOPY WITH PROPOFOL;  Surgeon: Jonathon Bellows, MD;  Location: [facility] ENDOSCOPY;  Service: Gastroenterology;  Laterality: N/A;   electroconvulsion therapy  03/07/15    Family History  Problem Relation Age of Onset   Diabetes Mother    Hypertension Father    Breast cancer Neg Hx    Colon cancer Neg Hx    Social History:  reports that she has never smoked. She has never used smokeless tobacco. She reports that she does not drink alcohol and does not use drugs.  Allergies:  Allergies  Allergen Reactions   Prednisone     Increases blood sugar    (Not in a hospital admission)   Results for orders placed or performed during the hospital encounter of 07/26/22 (from the past 48 hour(s))  Glucose, capillary     Status: Abnormal   Collection Time: 07/26/22 10:35 AM  Result Value Ref Range   Glucose-Capillary 113 (H) 70 - 99 mg/dL    Comment: Glucose reference range applies only to samples taken after fasting for at least 8 hours.   *Note: Due to a large number of results and/or encounters for the requested  time period, some results have not been displayed. A complete set of results can be found in Results Review.   No results found.  Review of Systems  Constitutional: Negative.   HENT: Negative.    Eyes: Negative.   Respiratory: Negative.    Cardiovascular: Negative.   Gastrointestinal: Negative.   Musculoskeletal: Negative.   Skin: Negative.   Neurological: Negative.   Psychiatric/Behavioral: Negative.      Blood pressure 136/76, pulse 92, temperature 98 F (36.7 C), resp. rate 20, height '5\' 5"'$  (1.651 m), weight 99.8 kg, SpO2 97 %. Physical Exam Vitals and nursing note reviewed.  Constitutional:      Appearance: She is well-developed.  HENT:     Head: Normocephalic and atraumatic.  Eyes:     Conjunctiva/sclera: Conjunctivae normal.     Pupils: Pupils are equal, round, and reactive to light.  Cardiovascular:     Heart sounds: Normal heart sounds.  Pulmonary:     Effort: Pulmonary effort is normal.  Abdominal:     Palpations: Abdomen is soft.  Musculoskeletal:        General: Normal range of motion.     Cervical back: Normal range of motion.  Skin:    General: Skin is warm and dry.  Neurological:     General: No focal deficit present.     Mental Status: She is alert.  Psychiatric:  Mood and Affect: Mood normal.        Thought Content: Thought content normal.      Assessment/Plan Treatment today and follow-up in 4 weeks and we will continue to see whether further decreases in treatment might be warranted as her mood remains stable  [name], MD 07/26/2022, 2:18 PM

## 2022-07-29 ENCOUNTER — Ambulatory Visit (INDEPENDENT_AMBULATORY_CARE_PROVIDER_SITE_OTHER): Payer: Medicare HMO | Admitting: Family Medicine

## 2022-07-29 ENCOUNTER — Encounter: Payer: Self-pay | Admitting: Family Medicine

## 2022-07-29 VITALS — BP 114/77 | HR 84 | Temp 98.7°F | Resp 16 | Wt 222.0 lb

## 2022-07-29 DIAGNOSIS — J3489 Other specified disorders of nose and nasal sinuses: Secondary | ICD-10-CM | POA: Diagnosis not present

## 2022-07-29 NOTE — Progress Notes (Signed)
    SUBJECTIVE:   CHIEF COMPLAINT / HPI:   NOSE SORES - improving from viral URI, onset 2 weeks ago. - had nasal congestion, runny nose which made difficult to breathe from nose. - subsequently picked at areas in nose, now with sores in nose. - sores itches and hurt. Occasionally bleed when picks at them. - has not put anything on sores.  - nasal congestion, runny nose, cough improving.  - denies known fever    OBJECTIVE:   BP 114/77 (BP Location: Left Arm, Patient Position: Sitting, Cuff Size: Large)   Pulse 84   Temp 98.7 F (37.1 C) (Oral)   Resp 16   Wt 222 lb (100.7 kg)   LMP  (LMP Unknown) Comment: pt states she has not had period in the past year  SpO2 96%   BMI 36.94 kg/m   Gen: well appearing, in NAD HEENT: orophyarynx clear without exudate or erythema. Uvula midline. No tonsillar enlargement. Good dentition. Superficial wound to inside of proximal bilateral nares with scabbing and minor oozing of blood. No pus or signs of infection.  Card: RRR Lungs: CTAB Ext: WWP, no edema   ASSESSMENT/PLAN:   Nasal sore Likely 2/2 induced trauma. No signs of infection. Recommended petroleum jelly for lubrication and soothing of irritation. Avoid picking.    Myles Gip, DO

## 2022-07-31 ENCOUNTER — Telehealth: Payer: Self-pay | Admitting: Pharmacy Technician

## 2022-07-31 DIAGNOSIS — Z596 Low income: Secondary | ICD-10-CM

## 2022-07-31 NOTE — Progress Notes (Signed)
[facility])                                            [place] Team    07/31/2022  Melinda [name] 1970/11/25 [id number]                                      Medication Assistance Referral  Referral From: Melinda [name]  Medication/Company: Melinda [name] / BI Patient application portion:  Mailed Provider application portion: Faxed  to Melinda [name] Provider address/fax verified via: Office website   Melinda [name], [place]  [phone] s

## 2022-08-14 ENCOUNTER — Telehealth: Payer: Self-pay

## 2022-08-14 NOTE — Telephone Encounter (Unsigned)
Copied from [place]. Topic: General - Other >> Aug 14, 2022  3:33 PM Leitha Schuller wrote: Pt inquiring if office has samples of jardiance  Pt states she is out  Please fu w/ pt

## 2022-08-15 NOTE — Telephone Encounter (Signed)
Advised patient we have two boxes for her.

## 2022-08-20 ENCOUNTER — Other Ambulatory Visit: Payer: Self-pay | Admitting: Family Medicine

## 2022-08-20 DIAGNOSIS — E119 Type 2 diabetes mellitus without complications: Secondary | ICD-10-CM

## 2022-08-20 NOTE — Telephone Encounter (Signed)
Requested Prescriptions  Pending Prescriptions Disp Refills  . atorvastatin (LIPITOR) 10 MG tablet [Pharmacy Med Name: ATORVASTATIN CALCIUM 10 MG Tablet] 90 tablet 10    Sig: TAKE 1 TABLET EVERY DAY     Cardiovascular:  Antilipid - Statins Failed - 08/20/2022  4:24 PM      Failed - Lipid Panel in normal range within the last 12 months    Cholesterol, Total  Date Value Ref Range Status  03/21/2022 136 100 - 199 mg/dL Final   LDL Chol Calc (NIH)  Date Value Ref Range Status  03/21/2022 71 0 - 99 mg/dL Final   HDL  Date Value Ref Range Status  03/21/2022 46 >39 mg/dL Final   Triglycerides  Date Value Ref Range Status  03/21/2022 102 0 - 149 mg/dL Final         Passed - Patient is not pregnant      Passed - Valid encounter within last 12 months    Recent Outpatient Visits          3 weeks ago Nasal sore   [facility] Myles Gip, DO   1 month ago Hypertension associated with diabetes [facility])   [place], Jake Church, DO   5 months ago Type 2 diabetes mellitus with hyperglycemia, without long-term current use of insulin [facility])   [facility], Dionne Bucy, MD   8 months ago Encounter for annual physical exam   [facility] [place], Dionne Bucy, MD   8 months ago Acute pain of right shoulder   [place], PA-C      Future Appointments            In 1 month [place], Dionne Bucy, MD [facility], [place]

## 2022-08-22 ENCOUNTER — Other Ambulatory Visit: Payer: Self-pay | Admitting: Psychiatry

## 2022-08-23 ENCOUNTER — Ambulatory Visit: Payer: Self-pay | Admitting: Certified Registered"

## 2022-08-23 ENCOUNTER — Encounter: Payer: Self-pay | Admitting: Certified Registered"

## 2022-08-23 ENCOUNTER — Ambulatory Visit
Admission: RE | Admit: 2022-08-23 | Discharge: 2022-08-23 | Disposition: A | Discharge status: Home / Self Care | Payer: Medicare HMO | Source: Ambulatory Visit | Attending: Psychiatry | Admitting: Psychiatry

## 2022-08-23 DIAGNOSIS — F332 Major depressive disorder, recurrent severe without psychotic features: Secondary | ICD-10-CM | POA: Diagnosis not present

## 2022-08-23 DIAGNOSIS — Z7984 Long term (current) use of oral hypoglycemic drugs: Secondary | ICD-10-CM | POA: Diagnosis not present

## 2022-08-23 DIAGNOSIS — I10 Essential (primary) hypertension: Secondary | ICD-10-CM | POA: Diagnosis not present

## 2022-08-23 DIAGNOSIS — K219 Gastro-esophageal reflux disease without esophagitis: Secondary | ICD-10-CM | POA: Diagnosis not present

## 2022-08-23 DIAGNOSIS — E039 Hypothyroidism, unspecified: Secondary | ICD-10-CM | POA: Insufficient documentation

## 2022-08-23 DIAGNOSIS — E119 Type 2 diabetes mellitus without complications: Secondary | ICD-10-CM | POA: Diagnosis not present

## 2022-08-23 DIAGNOSIS — F339 Major depressive disorder, recurrent, unspecified: Secondary | ICD-10-CM | POA: Insufficient documentation

## 2022-08-23 DIAGNOSIS — E7800 Pure hypercholesterolemia, unspecified: Secondary | ICD-10-CM | POA: Diagnosis not present

## 2022-08-23 DIAGNOSIS — Z6836 Body mass index (BMI) 36.0-36.9, adult: Secondary | ICD-10-CM | POA: Diagnosis not present

## 2022-08-23 DIAGNOSIS — R45851 Suicidal ideations: Secondary | ICD-10-CM | POA: Insufficient documentation

## 2022-08-23 DIAGNOSIS — G4730 Sleep apnea, unspecified: Secondary | ICD-10-CM | POA: Insufficient documentation

## 2022-08-23 DIAGNOSIS — E1142 Type 2 diabetes mellitus with diabetic polyneuropathy: Secondary | ICD-10-CM | POA: Diagnosis not present

## 2022-08-23 LAB — GLUCOSE, CAPILLARY
Glucose-Capillary: 109 mg/dL — ABNORMAL HIGH (ref 70–99)
Glucose-Capillary: 127 mg/dL — ABNORMAL HIGH (ref 70–99)

## 2022-08-23 MED ORDER — SUCCINYLCHOLINE CHLORIDE 200 MG/10ML IV SOSY
PREFILLED_SYRINGE | INTRAVENOUS | Status: DC | PRN
  Administered 2022-08-23: 100 mg via INTRAVENOUS

## 2022-08-23 MED ORDER — KETOROLAC TROMETHAMINE 30 MG/ML IJ SOLN: 30.0000 mg | Freq: Once | INTRAMUSCULAR | Status: AC

## 2022-08-23 MED ORDER — GLYCOPYRROLATE 0.2 MG/ML IJ SOLN: 0.4000 mg | Freq: Once | INTRAMUSCULAR | Status: AC

## 2022-08-23 MED ORDER — SODIUM CHLORIDE 0.9 % IV SOLN
500.0000 mL | Freq: Once | INTRAVENOUS | Status: AC
  Administered 2022-08-23: 500 mL via INTRAVENOUS

## 2022-08-23 MED ORDER — GLYCOPYRROLATE 0.2 MG/ML IJ SOLN
INTRAMUSCULAR | Status: AC
  Administered 2022-08-23: 0.4 mg via INTRAVENOUS
  Filled 2022-08-23: qty 2

## 2022-08-23 MED ORDER — KETOROLAC TROMETHAMINE 30 MG/ML IJ SOLN
INTRAMUSCULAR | Status: AC
  Administered 2022-08-23: 30 mg via INTRAVENOUS
  Filled 2022-08-23: qty 1

## 2022-08-23 MED ORDER — SODIUM CHLORIDE 0.9 % IV SOLN: INTRAVENOUS | Status: DC | PRN

## 2022-08-23 MED ORDER — METHOHEXITAL SODIUM 100 MG/10ML IV SOSY
PREFILLED_SYRINGE | INTRAVENOUS | Status: DC | PRN
  Administered 2022-08-23: 70 mg via INTRAVENOUS

## 2022-08-23 MED ORDER — ESMOLOL HCL 100 MG/10ML IV SOLN
INTRAVENOUS | Status: DC | PRN
  Administered 2022-08-23: 30 mg via INTRAVENOUS

## 2022-08-23 MED ORDER — LABETALOL HCL 5 MG/ML IV SOLN
INTRAVENOUS | Status: DC | PRN
  Administered 2022-08-23 (×2): 10 mg via INTRAVENOUS

## 2022-08-23 NOTE — Transfer of Care (Signed)
Immediate Anesthesia Transfer of Care Note  Patient: Melinda [name]  Procedure(s) Performed: ECT TX  Patient Location: PACU  Anesthesia Type:General  Level of Consciousness: drowsy  Airway & Oxygen Therapy: Patient Spontanous Breathing and Patient connected to nasal cannula oxygen  Post-op Assessment: Report given to RN  Post vital signs: stable  Last Vitals:  Vitals Value Taken Time  BP    Temp    Pulse    Resp    SpO2      Last Pain:  Vitals:   08/23/22 1016  TempSrc:   PainSc: 0-No pain         Complications: No notable events documented.

## 2022-08-23 NOTE — Anesthesia Preprocedure Evaluation (Signed)
Anesthesia Evaluation  Patient identified by MRN, date of birth, ID band Patient awake    Reviewed: Allergy & Precautions, NPO status , Patient's Chart, lab work & pertinent test results  Airway Mallampati: II  TM Distance: >3 FB Neck ROM: Full    Dental  (+) Teeth Intact   Pulmonary neg pulmonary ROS, sleep apnea ,    Pulmonary exam normal  + decreased breath sounds      Cardiovascular Exercise Tolerance: Good hypertension, Pt. on medications negative cardio ROS Normal cardiovascular exam Rhythm:Regular     Neuro/Psych Depression negative neurological ROS  negative psych ROS   GI/Hepatic negative GI ROS, Neg liver ROS, GERD  Medicated,  Endo/Other  negative endocrine ROSdiabetes, Type 2, Oral Hypoglycemic AgentsHypothyroidism Morbid obesity  Renal/GU negative Renal ROS  negative genitourinary   Musculoskeletal   Abdominal (+) + obese,   Peds negative pediatric ROS (+)  Hematology negative hematology ROS (+)   Anesthesia Other Findings Past Medical History: No date: Depression No date: Diabetes mellitus without complication ([place]) 7/48/27: Diabetic peripheral neuropathy ([place]) 03/07/15: Diabetic peripheral neuropathy ([place]) 03/07/15: Diabetic peripheral neuropathy (HCC) No date: GERD (gastroesophageal reflux disease) 03/07/15: Hypercholesterolemia No date: Hypertension 03/07/15: Obesity 03/07/15: Personality disorder ([place]) 03/07/15: Sinus tachycardia     Comment:  history of 03/07/15: Suicidal thoughts  Past Surgical History: 12/01/2018: COLONOSCOPY WITH PROPOFOL; N/A     Comment:  Procedure: COLONOSCOPY WITH PROPOFOL;  Surgeon: Jonathon Bellows, MD;  Location: [facility] ENDOSCOPY;  Service:               Gastroenterology;  Laterality: N/A; 03/07/15: electroconvulsion therapy  BMI    Body Mass Index: 36.61 kg/m      Reproductive/Obstetrics negative OB ROS                              Anesthesia Physical  Anesthesia Plan  ASA: 3  Anesthesia Plan: General   Post-op Pain Management:    Induction: Intravenous  PONV Risk Score and Plan: Propofol infusion and TIVA  Airway Management Planned: Natural Airway and Mask  Additional Equipment:   Intra-op Plan:   Post-operative Plan:   Informed Consent: I have reviewed the patients History and Physical, chart, labs and discussed the procedure including the risks, benefits and alternatives for the proposed anesthesia with the patient or authorized representative who has indicated his/her understanding and acceptance.     Dental Advisory Given  Plan Discussed with: CRNA and Surgeon  Anesthesia Plan Comments:         Anesthesia Quick Evaluation

## 2022-08-23 NOTE — Procedures (Signed)
ECT SERVICES Physician's Interval Evaluation & Treatment Note  Patient Identification: Melinda [name] MRN:  [id number] Date of Evaluation:  08/23/2022 TX #: [id number]  MADRS:   MMSE:   P.E. Findings:  No change to physical exam  Psychiatric Interval Note:  Mood stable  Subjective:  Patient is a 51 y.o. female seen for evaluation for Electroconvulsive Therapy. No new complaints  Treatment Summary:   '[]'$   Right Unilateral             '[x]'$  Bilateral   % Energy : 1.0 ms 35%   Impedance: 1672 ohms  Seizure Energy Index: 5286 V squared  Postictal Suppression Index: 45%  Seizure Concordance Index: 92%  Medications  Pre Shock: Robinul 0.4 mg Toradol 30 mg Brevital 70 mg succinylcholine 100 mg esmolol 20 mg labetalol 20 mg  Post Shock: None  Seizure Duration: 33 seconds EMG 82 seconds EEG   Comments: Well-tolerated stable no change in treatment plan follow-up 4 weeks  Lungs:  '[x]'$   Clear to auscultation               '[]'$  Other:   Heart:    '[x]'$   Regular rhythm             '[]'$  irregular rhythm    '[x]'$   Previous H&P reviewed, patient examined and there are NO CHANGES                 '[]'$   Previous H&P reviewed, patient examined and there are changes noted.   [name], MD 10/13/20234:08 PM

## 2022-08-23 NOTE — Procedures (Signed)
.  jtc 

## 2022-08-23 NOTE — Anesthesia Postprocedure Evaluation (Signed)
Anesthesia Post Note  Patient: Melinda [name]  Procedure(s) Performed: ECT TX  Patient location during evaluation: Endoscopy Anesthesia Type: General Level of consciousness: awake and alert Pain management: pain level controlled Vital Signs Assessment: post-procedure vital signs reviewed and stable Respiratory status: spontaneous breathing, nonlabored ventilation, respiratory function stable and patient connected to nasal cannula oxygen Cardiovascular status: blood pressure returned to baseline and stable Postop Assessment: no apparent nausea or vomiting Anesthetic complications: no   No notable events documented.   Last Vitals:  Vitals:   08/23/22 1245 08/23/22 1250  BP:    Pulse: 99 (!) 102  Resp: (!) 25   Temp: 37 C   SpO2: 94% 95%    Last Pain:  Vitals:   08/23/22 1245  TempSrc:   PainSc: 0-No pain                 Dimas Millin

## 2022-08-23 NOTE — H&P (Signed)
Indica Melinda [name] is an 51 y.o. female.   Chief Complaint: No new complaint HPI: Recurrent depression stabilized with ECT  Past Medical History:  Diagnosis Date   Depression    Diabetes mellitus without complication ([place])    Diabetic peripheral neuropathy ([name]) 03/07/15   Diabetic peripheral neuropathy ([place]) 03/07/15   Diabetic peripheral neuropathy ([place]) 03/07/15   GERD (gastroesophageal reflux disease)    Hypercholesterolemia 03/07/15   Hypertension    Obesity 03/07/15   Personality disorder ([place]) 03/07/15   Sinus tachycardia 03/07/15   history of   Suicidal thoughts 03/07/15    Past Surgical History:  Procedure Laterality Date   COLONOSCOPY WITH PROPOFOL N/A 12/01/2018   Procedure: COLONOSCOPY WITH PROPOFOL;  Surgeon: Jonathon Bellows, MD;  Location: [facility] ENDOSCOPY;  Service: Gastroenterology;  Laterality: N/A;   electroconvulsion therapy  03/07/15    Family History  Problem Relation Age of Onset   Diabetes Mother    Hypertension Father    Breast cancer Neg Hx    Colon cancer Neg Hx    Social History:  reports that she has never smoked. She has never used smokeless tobacco. She reports that she does not drink alcohol and does not use drugs.  Allergies:  Allergies  Allergen Reactions   Prednisone     Increases blood sugar    (Not in a hospital admission)   Results for orders placed or performed during the hospital encounter of 08/23/22 (from the past 48 hour(s))  Glucose, capillary     Status: Abnormal   Collection Time: 08/23/22 10:24 AM  Result Value Ref Range   Glucose-Capillary 109 (H) 70 - 99 mg/dL    Comment: Glucose reference range applies only to samples taken after fasting for at least 8 hours.  Glucose, capillary     Status: Abnormal   Collection Time: 08/23/22 12:06 PM  Result Value Ref Range   Glucose-Capillary 127 (H) 70 - 99 mg/dL    Comment: Glucose reference range applies only to samples taken after fasting for at least 8 hours.   Comment 1 Notify RN     Comment 2 Document in Chart    *Note: Due to a large number of results and/or encounters for the requested time period, some results have not been displayed. A complete set of results can be found in Results Review.   No results found.  Review of Systems  Constitutional: Negative.   HENT: Negative.    Eyes: Negative.   Respiratory: Negative.    Cardiovascular: Negative.   Gastrointestinal: Negative.   Musculoskeletal: Negative.   Skin: Negative.   Neurological: Negative.   Psychiatric/Behavioral: Negative.      Blood pressure 126/80, pulse 94, temperature 98.1 F (36.7 C), temperature source Oral, resp. rate 17, height '5\' 2"'$  (1.575 m), weight 103.4 kg, SpO2 94 %. Physical Exam Vitals and nursing note reviewed.  Constitutional:      Appearance: She is well-developed.  HENT:     Head: Normocephalic and atraumatic.  Eyes:     Conjunctiva/sclera: Conjunctivae normal.     Pupils: Pupils are equal, round, and reactive to light.  Cardiovascular:     Heart sounds: Normal heart sounds.  Pulmonary:     Effort: Pulmonary effort is normal.  Abdominal:     Palpations: Abdomen is soft.  Musculoskeletal:        General: Normal range of motion.     Cervical back: Normal range of motion.  Skin:    General: Skin is warm and dry.  Neurological:     General: No focal deficit present.     Mental Status: She is alert.  Psychiatric:        Mood and Affect: Mood normal.      Assessment/Plan Treatment today follow-up 4 weeks  [name], MD 08/23/2022, 4:07 PM

## 2022-08-28 ENCOUNTER — Telehealth: Payer: Self-pay | Admitting: Pharmacy Technician

## 2022-08-28 DIAGNOSIS — Z596 Low income: Secondary | ICD-10-CM

## 2022-08-28 NOTE — Progress Notes (Signed)
[facility])                                            [place] Team    08/28/2022  Melinda [name] 05/24/1971 [id number]  Received both patient and provider portion(s) of patient assistance application(s) for Jardiance. Faxed completed application and required documents into BI.    Jeanpierre Thebeau P. Parnell Spieler, [place]  [phone]

## 2022-09-02 ENCOUNTER — Telehealth: Payer: Self-pay

## 2022-09-02 NOTE — Telephone Encounter (Signed)
Copied from [place] [phone]. Topic: General - Inquiry >> Sep 02, 2022 10:07 AM Penni Bombard wrote: Reason for CRM: Pt called to ask if the office has anymore samples of Jardiance available CB@  [phone]

## 2022-09-02 NOTE — Telephone Encounter (Signed)
Patient called, left VM to return the call to the office. 

## 2022-09-03 NOTE — Telephone Encounter (Signed)
Left message advising of no sample of Jardiance.

## 2022-09-04 ENCOUNTER — Telehealth: Payer: Self-pay | Admitting: Pharmacy Technician

## 2022-09-04 DIAGNOSIS — Z596 Low income: Secondary | ICD-10-CM

## 2022-09-04 NOTE — Progress Notes (Signed)
[facility])                                            [place] Team    09/04/2022  Karina Nofsinger Sulkowski [birth year] [id number]  Care coordination call placed to BI in regard to [facility] application.  Spoke to [place] who informs patient will need to apply for LIS based on income submitted. If anything, less than full LIS, then the patient will need to submit the denial letter as well as proof of copay for Jardiance. If patient has full LIS then she will not be eligible for the program.  Loretha Brasil, [facility] PharmD helped patient apply for LIS on 07/24/22.  Patient is aware of need for the determination letter from LIS as well as copay information.  Fusaye Wachtel P. Kinley Dozier, [place]  [phone]

## 2022-09-12 ENCOUNTER — Telehealth: Payer: Self-pay

## 2022-09-12 ENCOUNTER — Other Ambulatory Visit: Payer: Self-pay | Admitting: Family Medicine

## 2022-09-12 DIAGNOSIS — E1165 Type 2 diabetes mellitus with hyperglycemia: Secondary | ICD-10-CM

## 2022-09-12 NOTE — Telephone Encounter (Signed)
Ok to give 1 month supply if we have samples.

## 2022-09-12 NOTE — Telephone Encounter (Signed)
Copied from [place] [phone]. Topic: General - Other >> Sep 12, 2022  3:32 PM Leitha Schuller wrote: Pt inquiring if office has samples of Jardiance  Please fu w/ pt

## 2022-09-12 NOTE — Telephone Encounter (Signed)
Please advise samples?

## 2022-09-13 NOTE — Telephone Encounter (Signed)
Left detailed message advising we have two weeks worth of samples to pick up at front desk.

## 2022-09-13 NOTE — Telephone Encounter (Signed)
Requested Prescriptions  Pending Prescriptions Disp Refills   metFORMIN (GLUCOPHAGE-XR) 500 MG 24 hr tablet [Pharmacy Med Name: METFORMIN HYDROCHLORIDE ER 500 MG Tablet Extended Release 24 Hour] 180 tablet 1    Sig: TAKE 1 TABLET (500 MG TOTAL) BY MOUTH IN THE MORNING AND AT BEDTIME.     Endocrinology:  Diabetes - Biguanides Failed - 09/12/2022  4:47 PM      Failed - B12 Level in normal range and within 720 days    No results found for: "VITAMINB12"       Passed - Cr in normal range and within 360 days    Creatinine  Date Value Ref Range Status  05/08/2013 0.77 0.60 - 1.30 mg/dL Final   Creatinine, Ser  Date Value Ref Range Status  06/24/2022 0.74 0.57 - 1.00 mg/dL Final         Passed - HBA1C is between 0 and 7.9 and within 180 days    Hgb A1c MFr Bld  Date Value Ref Range Status  06/24/2022 7.0 (H) 4.8 - 5.6 % Final    Comment:             Prediabetes: 5.7 - 6.4          Diabetes: >6.4          Glycemic control for adults with diabetes: <7.0          Passed - eGFR in normal range and within 360 days    EGFR (African American)  Date Value Ref Range Status  05/08/2013 >60  Final   GFR calc Af Amer  Date Value Ref Range Status  05/16/2020 90 >59 mL/min/1.73 Final    Comment:    **[facility] currently reports eGFR in compliance with the current**   recommendations of the [employer]. [facility] will   update reporting as new guidelines are published from the NKF-ASN   Task force.    EGFR (Non-African Amer.)  Date Value Ref Range Status  05/08/2013 >60  Final    Comment:    eGFR values <69m/min/1.73 m2 may be an indication of chronic kidney disease (CKD). Calculated eGFR is useful in patients with stable renal function. The eGFR calculation will not be reliable in acutely ill patients when serum creatinine is changing rapidly. It is not useful in  patients on dialysis. The eGFR calculation may not be applicable to patients at the low and high extremes of  body sizes, pregnant women, and vegetarians.    GFR calc non Af Amer  Date Value Ref Range Status  05/16/2020 78 >59 mL/min/1.73 Final   eGFR  Date Value Ref Range Status  06/24/2022 97 >59 mL/min/1.73 Final         Passed - Valid encounter within last 6 months    Recent Outpatient Visits           1 month ago Nasal sore   [place]   2 months ago [facility]pertension associated with diabetes [facility]   [facility]   5 months ago Type 2 diabetes mellitus with hyperglycemia, without long-term current use of insulin ([facility]   [facility]   9 months ago [facility] physical exam   [facility]   9 months ago Acute pain of right shoulder   [facility] [place]      Future Appointments  In 3 weeks Bacigalupo, Dionne Bucy, MD [facility]            Passed - CBC within normal limits and completed in the last 12 months    WBC  Date Value Ref Range Status  06/24/2022 7.0 3.4 - 10.8 x10E3/uL Final  07/18/2017 7.9 3.6 - 11.0 K/uL Final   RBC  Date Value Ref Range Status  06/24/2022 4.64 3.77 - 5.28 x10E6/uL Final  07/18/2017 4.57 3.80 - 5.20 MIL/uL Final   Hemoglobin  Date Value Ref Range Status  06/24/2022 12.0 11.1 - 15.9 g/dL Final   Hematocrit  Date Value Ref Range Status  06/24/2022 37.0 34.0 - 46.6 % Final   MCHC  Date Value Ref Range Status  06/24/2022 32.4 31.5 - 35.7 g/dL Final  07/18/2017 33.6 32.0 - 36.0 g/dL Final   [facility]  Date Value Ref Range Status  06/24/2022 25.9 (L) 26.6 - 33.0 pg Final  07/18/2017 27.4 26.0 - 34.0 pg Final   MCV  Date Value Ref Range Status  06/24/2022 80 79 - 97 fL Final  05/08/2013 80 80 - 100 fL Final   No results found for: "PLTCOUNTKUC", "LABPLAT", "POCPLA" RDW  Date Value Ref Range Status  06/24/2022 12.1 11.7 -  15.4 % Final  05/08/2013 14.2 11.5 - 14.5 % Final

## 2022-09-19 ENCOUNTER — Other Ambulatory Visit: Payer: Self-pay | Admitting: Psychiatry

## 2022-09-20 ENCOUNTER — Other Ambulatory Visit (HOSPITAL_COMMUNITY): Payer: Self-pay

## 2022-09-20 ENCOUNTER — Encounter
Admission: RE | Admit: 2022-09-20 | Discharge: 2022-09-20 | Disposition: A | Discharge status: Home / Self Care | Payer: Medicare HMO | Source: Ambulatory Visit | Attending: Psychiatry | Admitting: Psychiatry

## 2022-09-20 ENCOUNTER — Encounter: Payer: Self-pay | Admitting: Certified Registered Nurse Anesthetist

## 2022-09-20 ENCOUNTER — Ambulatory Visit: Payer: Self-pay | Admitting: Certified Registered Nurse Anesthetist

## 2022-09-20 DIAGNOSIS — I10 Essential (primary) hypertension: Secondary | ICD-10-CM | POA: Diagnosis not present

## 2022-09-20 DIAGNOSIS — F332 Major depressive disorder, recurrent severe without psychotic features: Secondary | ICD-10-CM | POA: Insufficient documentation

## 2022-09-20 DIAGNOSIS — E119 Type 2 diabetes mellitus without complications: Secondary | ICD-10-CM | POA: Diagnosis not present

## 2022-09-20 DIAGNOSIS — Z7984 Long term (current) use of oral hypoglycemic drugs: Secondary | ICD-10-CM | POA: Diagnosis not present

## 2022-09-20 LAB — GLUCOSE, CAPILLARY
Glucose-Capillary: 87 mg/dL (ref 70–99)
Glucose-Capillary: 124 mg/dL — ABNORMAL HIGH (ref 70–99)

## 2022-09-20 MED ORDER — KETOROLAC TROMETHAMINE 30 MG/ML IJ SOLN: 30.0000 mg | Freq: Once | INTRAMUSCULAR | Status: AC

## 2022-09-20 MED ORDER — SUCCINYLCHOLINE CHLORIDE 200 MG/10ML IV SOSY
PREFILLED_SYRINGE | INTRAVENOUS | Status: DC | PRN
  Administered 2022-09-20: 100 mg via INTRAVENOUS

## 2022-09-20 MED ORDER — SODIUM CHLORIDE 0.9 % IV SOLN: 500.0000 mL | Freq: Once | INTRAVENOUS | Status: DC

## 2022-09-20 MED ORDER — KETOROLAC TROMETHAMINE 30 MG/ML IJ SOLN
INTRAMUSCULAR | Status: AC
  Administered 2022-09-20: 30 mg via INTRAVENOUS
  Filled 2022-09-20: qty 1

## 2022-09-20 MED ORDER — ESMOLOL HCL 100 MG/10ML IV SOLN
INTRAVENOUS | Status: AC
  Filled 2022-09-20: qty 10

## 2022-09-20 MED ORDER — DEXTROSE-NACL 5-0.45 % IV SOLN: INTRAVENOUS | Status: DC

## 2022-09-20 MED ORDER — METHOHEXITAL SODIUM 100 MG/10ML IV SOSY
PREFILLED_SYRINGE | INTRAVENOUS | Status: DC | PRN
  Administered 2022-09-20: 70 mg via INTRAVENOUS

## 2022-09-20 MED ORDER — GLYCOPYRROLATE 0.2 MG/ML IJ SOLN
INTRAMUSCULAR | Status: AC
  Administered 2022-09-20: 0.4 mg via INTRAVENOUS
  Filled 2022-09-20: qty 2

## 2022-09-20 MED ORDER — LABETALOL HCL 5 MG/ML IV SOLN
INTRAVENOUS | Status: AC
  Filled 2022-09-20: qty 4

## 2022-09-20 MED ORDER — LABETALOL HCL 5 MG/ML IV SOLN
INTRAVENOUS | Status: DC | PRN
  Administered 2022-09-20: 10 mg via INTRAVENOUS

## 2022-09-20 MED ORDER — GLYCOPYRROLATE 0.2 MG/ML IJ SOLN: 0.4000 mg | Freq: Once | INTRAMUSCULAR | Status: AC

## 2022-09-20 MED ORDER — METHOHEXITAL SODIUM 0.5 G IJ SOLR
INTRAMUSCULAR | Status: AC
  Filled 2022-09-20: qty 500

## 2022-09-20 MED ORDER — ESMOLOL HCL 100 MG/10ML IV SOLN
INTRAVENOUS | Status: DC | PRN
  Administered 2022-09-20: 30 mg via INTRAVENOUS

## 2022-09-20 NOTE — Anesthesia Procedure Notes (Signed)
Procedure Name: General with mask airway Date/Time: 09/20/2022 12:55 PM  Performed by: Johnna Acosta, CRNAPre-anesthesia Checklist: Patient identified, Emergency Drugs available, Suction available, Patient being monitored and Timeout performed Patient Re-evaluated:Patient Re-evaluated prior to induction Oxygen Delivery Method: Circle system utilized Preoxygenation: Pre-oxygenation with 100% oxygen Induction Type: IV induction Ventilation: Mask ventilation without difficulty

## 2022-09-20 NOTE — Transfer of Care (Signed)
Immediate Anesthesia Transfer of Care Note  Patient: Melinda [name]  Procedure(s) Performed: ECT TX  Patient Location: PACU  Anesthesia Type:General  Level of Consciousness: drowsy  Airway & Oxygen Therapy: Patient Spontanous Breathing and Patient connected to face mask oxygen  Post-op Assessment: Report given to RN and Post -op Vital signs reviewed and stable  Post vital signs: Reviewed and stable  Last Vitals:  Vitals Value Taken Time  BP 129/89 09/20/22 1314  Temp 37.3 C 09/20/22 1314  Pulse 103 09/20/22 1315  Resp 16 09/20/22 1315  SpO2 100 % 09/20/22 1315  Vitals shown include unvalidated device data.  Last Pain:  Vitals:   09/20/22 1047  TempSrc:   PainSc: 0-No pain         Complications: No notable events documented.

## 2022-09-20 NOTE — H&P (Signed)
Melinda [name] is an 51 y.o. female.   Chief Complaint: Patient reports mood has been pretty stable.  Major stress at home is that her heat has gone out.  No suicidal thought no psychosis HPI: Longstanding depression with maintenance in part from ECT  Past Medical History:  Diagnosis Date   Depression    Diabetes mellitus without complication ([place])    Diabetic peripheral neuropathy ([place]) 03/07/15   Diabetic peripheral neuropathy ([place]) 03/07/15   Diabetic peripheral neuropathy ([place]) 03/07/15   GERD (gastroesophageal reflux disease)    Hypercholesterolemia 03/07/15   Hypertension    Obesity 03/07/15   Personality disorder ([place]) 03/07/15   Sinus tachycardia 03/07/15   history of   Suicidal thoughts 03/07/15    Past Surgical History:  Procedure Laterality Date   COLONOSCOPY WITH PROPOFOL N/A 12/01/2018   Procedure: COLONOSCOPY WITH PROPOFOL;  Surgeon: Jonathon Bellows, MD;  Location: [facility] ENDOSCOPY;  Service: Gastroenterology;  Laterality: N/A;   electroconvulsion therapy  03/07/15    Family History  Problem Relation Age of Onset   Diabetes Mother    Hypertension Father    Breast cancer Neg Hx    Colon cancer Neg Hx    Social History:  reports that she has never smoked. She has never used smokeless tobacco. She reports that she does not drink alcohol and does not use drugs.  Allergies:  Allergies  Allergen Reactions   Prednisone     Increases blood sugar    (Not in a hospital admission)   Results for orders placed or performed during the hospital encounter of 09/20/22 (from the past 48 hour(s))  Glucose, capillary     Status: None   Collection Time: 09/20/22 10:35 AM  Result Value Ref Range   Glucose-Capillary 87 70 - 99 mg/dL    Comment: Glucose reference range applies only to samples taken after fasting for at least 8 hours.  Glucose, capillary     Status: Abnormal   Collection Time: 09/20/22  1:30 PM  Result Value Ref Range   Glucose-Capillary 124 (H) 70 - 99 mg/dL     Comment: Glucose reference range applies only to samples taken after fasting for at least 8 hours.   *Note: Due to a large number of results and/or encounters for the requested time period, some results have not been displayed. A complete set of results can be found in Results Review.   No results found.  Review of Systems  Constitutional: Negative.   HENT: Negative.    Eyes: Negative.   Respiratory: Negative.    Cardiovascular: Negative.   Gastrointestinal: Negative.   Musculoskeletal: Negative.   Skin: Negative.   Neurological: Negative.   Psychiatric/Behavioral: Negative.    All other systems reviewed and are negative.   Blood pressure 132/80, pulse 98, temperature 98.2 F (36.8 C), temperature source Oral, resp. rate 18, height '5\' 3"'$  (1.6 m), weight 101.2 kg, SpO2 93 %. Physical Exam Vitals reviewed.  Constitutional:      Appearance: She is well-developed.  HENT:     Head: Normocephalic and atraumatic.  Eyes:     Conjunctiva/sclera: Conjunctivae normal.     Pupils: Pupils are equal, round, and reactive to light.  Cardiovascular:     Heart sounds: Normal heart sounds.  Pulmonary:     Effort: Pulmonary effort is normal.  Abdominal:     Palpations: Abdomen is soft.  Musculoskeletal:        General: Normal range of motion.     Cervical back: Normal  range of motion.  Skin:    General: Skin is warm and dry.  Neurological:     Mental Status: She is alert.  Psychiatric:        Mood and Affect: Mood normal.        Thought Content: Thought content normal.      Assessment/Plan Treatment today and follow-up in 4 weeks  [name], MD 09/20/2022, 2:54 PM

## 2022-09-20 NOTE — Anesthesia Preprocedure Evaluation (Signed)
Anesthesia Evaluation  Patient identified by MRN, date of birth, ID band Patient awake    Reviewed: Allergy & Precautions, NPO status , Patient's Chart, lab work & pertinent test results  Airway Mallampati: II  TM Distance: >3 FB Neck ROM: Full    Dental  (+) Teeth Intact   Pulmonary neg pulmonary ROS, sleep apnea    Pulmonary exam normal  + decreased breath sounds      Cardiovascular Exercise Tolerance: Good hypertension, Pt. on medications negative cardio ROS Normal cardiovascular exam Rhythm:Regular     Neuro/Psych    Depression    negative neurological ROS  negative psych ROS   GI/Hepatic negative GI ROS, Neg liver ROS,GERD  Medicated,,  Endo/Other  negative endocrine ROSdiabetes, Type 2, Oral Hypoglycemic AgentsHypothyroidism  Morbid obesity  Renal/GU negative Renal ROS  negative genitourinary   Musculoskeletal   Abdominal  (+) + obese  Peds negative pediatric ROS (+)  Hematology negative hematology ROS (+)   Anesthesia Other Findings Past Medical History: No date: Depression No date: Diabetes mellitus without complication ([place]) 6/50/35: Diabetic peripheral neuropathy ([place]) 03/07/15: Diabetic peripheral neuropathy ([place]) 03/07/15: Diabetic peripheral neuropathy ([facility]) No date: GERD (gastroesophageal reflux disease) 03/07/15: Hypercholesterolemia No date: Hypertension 03/07/15: Obesity 03/07/15: Personality disorder ([place]) 03/07/15: Sinus tachycardia     Comment:  history of 03/07/15: Suicidal thoughts  Past Surgical History: 12/01/2018: COLONOSCOPY WITH PROPOFOL; N/A     Comment:  Procedure: COLONOSCOPY WITH PROPOFOL;  Surgeon: Jonathon Bellows, MD;  Location[name] ENDOSCOPY;  Service:               Gastroenterology;  Laterality: N/A; 03/07/15: electroconvulsion therapy  BMI    Body Mass Index: 36.61 kg/m      Reproductive/Obstetrics negative OB ROS                               Anesthesia Physical Anesthesia Plan  ASA: 3  Anesthesia Plan: General   Post-op Pain Management:    Induction: Intravenous  PONV Risk Score and Plan: Propofol infusion and TIVA  Airway Management Planned: Natural Airway and Mask  Additional Equipment:   Intra-op Plan:   Post-operative Plan:   Informed Consent: I have reviewed the patients History and Physical, chart, labs and discussed the procedure including the risks, benefits and alternatives for the proposed anesthesia with the patient or authorized representative who has indicated his/her understanding and acceptance.     Dental Advisory Given  Plan Discussed with: CRNA and Surgeon  Anesthesia Plan Comments: (Patient consented for risks of anesthesia including but not limited to:  - adverse reactions to medications - risk of airway placement if required - damage to eyes, teeth, lips or other oral mucosa - nerve damage due to positioning  - sore throat or hoarseness - Damage to heart, brain, nerves, lungs, other parts of body or loss of life  Patient voiced understanding.)         Anesthesia Quick Evaluation

## 2022-09-20 NOTE — Procedures (Signed)
ECT SERVICES Physician's Interval Evaluation & Treatment Note  Patient Identification: Melinda [name] MRN:  [id number] Date of Evaluation:  09/20/2022 TX #: [id number]  MADRS:   MMSE:   P.E. Findings:  Normal physical exam  Psychiatric Interval Note:  Mood stable no major depression  Subjective:  Patient is a 51 y.o. female seen for evaluation for Electroconvulsive Therapy. No specific complaint  Treatment Summary:   '[]'$   Right Unilateral             '[x]'$  Bilateral   % Energy : 1.0 ms 35%   Impedance: No reading  Seizure Energy Index: No rating  Postictal Suppression Index: No reading  Seizure Concordance Index: No reading  Medications  Pre Shock: Robinul 0.4 mg Toradol 30 mg Brevital 70 mg succinylcholine 100 mg esmolol 20 mg labetalol 20 mg  Post Shock:    Seizure Duration: 41 seconds EMG 103 seconds EEG   Comments: Not sure why the machine did not make any of its readings but it did provide a tracing and she clearly had a robust normal seizure like she usually does.  No complications.  We will see her back in 4 weeks  Lungs:  '[x]'$   Clear to auscultation               '[]'$  Other:   Heart:    '[x]'$   Regular rhythm             '[]'$  irregular rhythm    '[x]'$   Previous H&P reviewed, patient examined and there are NO CHANGES                 '[]'$   Previous H&P reviewed, patient examined and there are changes noted.   [name], MD 11/10/20233:00 PM

## 2022-09-20 NOTE — Anesthesia Postprocedure Evaluation (Signed)
Anesthesia Post Note  Patient: Melinda [name]  Procedure(s) Performed: ECT TX  Patient location during evaluation: PACU Anesthesia Type: General Level of consciousness: awake and alert Pain management: pain level controlled Vital Signs Assessment: post-procedure vital signs reviewed and stable Respiratory status: spontaneous breathing, nonlabored ventilation, respiratory function stable and patient connected to nasal cannula oxygen Cardiovascular status: blood pressure returned to baseline and stable Postop Assessment: no apparent nausea or vomiting Anesthetic complications: no   No notable events documented.   Last Vitals:  Vitals:   09/20/22 1314 09/20/22 1320  BP: 129/89 (!) 143/79  Pulse: (!) 105   Resp: 17 (!) 26  Temp: 37.3 C   SpO2: 94% 92%    Last Pain:  Vitals:   09/20/22 1314  TempSrc:   PainSc: Asleep                 [name]

## 2022-09-27 ENCOUNTER — Telehealth: Payer: Self-pay | Admitting: Pharmacy Technician

## 2022-09-27 ENCOUNTER — Telehealth: Payer: Self-pay | Admitting: Family Medicine

## 2022-09-27 DIAGNOSIS — Z596 Low income: Secondary | ICD-10-CM

## 2022-09-27 NOTE — Telephone Encounter (Unsigned)
Pt wants to know if you have any samples of the

## 2022-09-27 NOTE — Progress Notes (Signed)
Melinda [name])                                            Melinda [name]    09/27/2022  Melinda [name] [birth year] [id number]  Care coordination call placed to BI in regard to [facility] application.  Spoke to Bri who informs patient is APPROVED 09/27/22-11/11/23. Patient's medication will be delivered to her home address on file.  Saaya Procell P. Myalee Stengel, [place]  [phone]

## 2022-10-01 NOTE — Progress Notes (Deleted)
I,Kodi Steil S Dagoberto Nealy,acting as a scribe for Lavon Paganini, MD.,have documented all relevant documentation on the behalf of Lavon Paganini, MD,as directed by  Lavon Paganini, MD while in the presence of Lavon Paganini, MD.     Established patient visit   Patient: Melinda [name]   DOB: 13-Apr-1971   51 y.o. Female  MRN: [id number] Visit Date: 10/07/2022  Today's healthcare provider: Lavon Paganini, MD   No chief complaint on file.  Subjective    HPI  Diabetes Mellitus Type II, follow-up  Lab Results  Component Value Date   HGBA1C 7.0 (H) 06/24/2022   HGBA1C 8.0 (A) 03/21/2022   HGBA1C 8.2 (H) 12/06/2021   Last seen for diabetes 3 months ago.  Management since then includes continuing the same treatment. She reports {excellent/good/fair/poor:19665} compliance with treatment. She {is/is not:21021397} having side effects. {document side effects if present:1}  Home blood sugar records: {diabetes glucometry results:16657}  Episodes of hypoglycemia? {Yes/No:20286} {enter details if yes:1}   Current insulin regiment: none Most Recent Eye Exam: ***  --------------------------------------------------------------------------------------------------- Hypertension, follow-up  BP Readings from Last 3 Encounters:  07/29/22 114/77  06/24/22 124/75  03/21/22 135/85   Wt Readings from Last 3 Encounters:  07/29/22 222 lb (100.7 kg)  06/24/22 227 lb 1.6 oz (103 kg)  03/21/22 249 lb (112.9 kg)     She was last seen for hypertension 3 months ago.  BP at that visit was 124/75. Management since that visit includes no changes. She reports {excellent/good/fair/poor:19665} compliance with treatment. She {is/is not:9024} having side effects. {document side effects if present:1} She {is/is not:9024} exercising. She {is/is not:9024} adherent to low salt diet.   Outside blood pressures are {enter patient reported home BP, or 'not being checked':1}.  She does not  smoke.  Use of agents associated with hypertension: none.   --------------------------------------------------------------------------------------------------- Lipid/Cholesterol, follow-up  Last Lipid Panel: Lab Results  Component Value Date   CHOL 136 03/21/2022   LDLCALC 71 03/21/2022   HDL 46 03/21/2022   TRIG 102 03/21/2022    She was last seen for this 3 months ago.  Management since that visit includes no changes.  She reports {excellent/good/fair/poor:19665} compliance with treatment. She {is/is not:9024} having side effects. {document side effects if present:1}  Symptoms: {Yes/No:20286} appetite changes {Yes/No:20286} foot ulcerations  {Yes/No:20286} chest pain {Yes/No:20286} chest pressure/discomfort  {Yes/No:20286} dyspnea {Yes/No:20286} orthopnea  {Yes/No:20286} fatigue {Yes/No:20286} lower extremity edema  {Yes/No:20286} palpitations {Yes/No:20286} paroxysmal nocturnal dyspnea  {Yes/No:20286} nausea {Yes/No:20286} numbness or tingling of extremity  {Yes/No:20286} polydipsia {Yes/No:20286} polyuria  {Yes/No:20286} speech difficulty {Yes/No:20286} syncope   She is following a {diet:21022986} diet. Current exercise: {exercise NLZJQ:73419}  Last metabolic panel Lab Results  Component Value Date   GLUCOSE 98 06/24/2022   NA 142 06/24/2022   K 3.8 06/24/2022   BUN 8 06/24/2022   CREATININE 0.74 06/24/2022   EGFR 97 06/24/2022   GFRNONAA 78 05/16/2020   CALCIUM 9.4 06/24/2022   AST 14 06/24/2022   ALT 13 06/24/2022   The 10-year ASCVD risk score (Arnett DK, et al., 2019) is: 9.6%  ---------------------------------------------------------------------------------------------------   Medications: Outpatient Medications Prior to Visit  Medication Sig   atorvastatin (LIPITOR) 10 MG tablet TAKE 1 TABLET EVERY DAY   Blood Glucose Monitoring Suppl (TRUE METRIX METER) w/Device KIT USE AS DIRECTED AS NEEDED  FOR  DIABETES  MELLITUS  TYPE 2   buPROPion (WELLBUTRIN  XL) 300 MG 24 hr tablet Take 1 tablet (300 mg total) by mouth daily.   empagliflozin (JARDIANCE) 25 MG  TABS tablet Take 1 tablet (25 mg total) by mouth daily before breakfast.   escitalopram (LEXAPRO) 20 MG tablet Take 1 tablet (20 mg total) by mouth daily.   lisinopril (ZESTRIL) 10 MG tablet TAKE 1 TABLET EVERY DAY   meloxicam (MOBIC) 7.5 MG tablet TAKE 1 TABLET EVERY DAY   metFORMIN (GLUCOPHAGE-XR) 500 MG 24 hr tablet TAKE 1 TABLET (500 MG TOTAL) BY MOUTH IN THE MORNING AND AT BEDTIME.   TRUE METRIX BLOOD GLUCOSE TEST test strip CHECK FASTING BLOOD SUGAR ONCE DAILY   TRUEplus Lancets 33G MISC CHECK BLOOD GLUCOSE  LEVEL ONE TIME DAILY   ziprasidone (GEODON) 80 MG capsule TAKE 1 CAPSULE TWICE DAILY   No facility-administered medications prior to visit.    Review of Systems  Constitutional:  Negative for appetite change, fatigue and unexpected weight change.  Eyes:  Negative for visual disturbance.  Respiratory:  Negative for cough, chest tightness and shortness of breath.   Cardiovascular:  Negative for chest pain and leg swelling.  Gastrointestinal:  Negative for abdominal pain, diarrhea, nausea and vomiting.  Endocrine: Negative for cold intolerance and heat intolerance.  Neurological:  Negative for dizziness, light-headedness and headaches.    {Labs  Heme  Chem  Endocrine  Serology  Results Review (optional):23779}   Objective    LMP  (LMP Unknown) Comment: pt states she has not had period in the past year BP Readings from Last 3 Encounters:  07/29/22 114/77  06/24/22 124/75  03/21/22 135/85   Wt Readings from Last 3 Encounters:  07/29/22 222 lb (100.7 kg)  06/24/22 227 lb 1.6 oz (103 kg)  03/21/22 249 lb (112.9 kg)      Physical Exam  ***  No results found for any visits on 10/07/22.  Assessment & Plan     ***  No follow-ups on file.      {provider attestation***:1}   Lavon Paganini, MD  [facility] [phone] (phone) [phone]  (fax)  [place]

## 2022-10-07 ENCOUNTER — Ambulatory Visit: Payer: Medicare HMO | Admitting: Family Medicine

## 2022-10-07 DIAGNOSIS — E1165 Type 2 diabetes mellitus with hyperglycemia: Secondary | ICD-10-CM

## 2022-10-07 DIAGNOSIS — E1169 Type 2 diabetes mellitus with other specified complication: Secondary | ICD-10-CM

## 2022-10-07 DIAGNOSIS — I152 Hypertension secondary to endocrine disorders: Secondary | ICD-10-CM

## 2022-10-16 NOTE — Progress Notes (Unsigned)
I,Coalton Arch S Ignatz Deis,acting as a scribe for Lavon Paganini, MD.,have documented all relevant documentation on the behalf of Lavon Paganini, MD,as directed by  Lavon Paganini, MD while in the presence of Lavon Paganini, MD.     Established patient visit   Patient: Melinda [name]   DOB: 1970-11-26   51 y.o. Female  MRN: [id number] Visit Date: 10/17/2022  Today's healthcare provider: Lavon Paganini, MD   No chief complaint on file.  Subjective    HPI  Diabetes Mellitus Type II, follow-up  Lab Results  Component Value Date   HGBA1C 7.0 (H) 06/24/2022   HGBA1C 8.0 (A) 03/21/2022   HGBA1C 8.2 (H) 12/06/2021   Last seen for diabetes 4 months ago.  Management since then includes continuing the same treatment. She reports {excellent/good/fair/poor:19665} compliance with treatment. She {is/is not:21021397} having side effects.   Home blood sugar records: {diabetes glucometry results:16657}  Episodes of hypoglycemia? No    Current insulin regiment: none Most Recent Eye Exam: UTD  --------------------------------------------------------------------------------------------------- Hypertension, follow-up  BP Readings from Last 3 Encounters:  07/29/22 114/77  06/24/22 124/75  03/21/22 135/85   Wt Readings from Last 3 Encounters:  07/29/22 222 lb (100.7 kg)  06/24/22 227 lb 1.6 oz (103 kg)  03/21/22 249 lb (112.9 kg)     She was last seen for hypertension 4 months ago.  BP at that visit was 124/75. Management since that visit includes no chagnes. She reports {excellent/good/fair/poor:19665} compliance with treatment. She {is/is not:9024} having side effects. {document side effects if present:1} Outside blood pressures are {enter patient reported home BP, or 'not being checked':1}.  Use of agents associated with hypertension: none.    ---------------------------------------------------------------------------------------------------  Medications: Outpatient Medications Prior to Visit  Medication Sig   atorvastatin (LIPITOR) 10 MG tablet TAKE 1 TABLET EVERY DAY   Blood Glucose Monitoring Suppl (TRUE METRIX METER) w/Device KIT USE AS DIRECTED AS NEEDED  FOR  DIABETES  MELLITUS  TYPE 2   buPROPion (WELLBUTRIN XL) 300 MG 24 hr tablet Take 1 tablet (300 mg total) by mouth daily.   empagliflozin (JARDIANCE) 25 MG TABS tablet Take 1 tablet (25 mg total) by mouth daily before breakfast.   escitalopram (LEXAPRO) 20 MG tablet Take 1 tablet (20 mg total) by mouth daily.   lisinopril (ZESTRIL) 10 MG tablet TAKE 1 TABLET EVERY DAY   meloxicam (MOBIC) 7.5 MG tablet TAKE 1 TABLET EVERY DAY   metFORMIN (GLUCOPHAGE-XR) 500 MG 24 hr tablet TAKE 1 TABLET (500 MG TOTAL) BY MOUTH IN THE MORNING AND AT BEDTIME.   TRUE METRIX BLOOD GLUCOSE TEST test strip CHECK FASTING BLOOD SUGAR ONCE DAILY   TRUEplus Lancets 33G MISC CHECK BLOOD GLUCOSE  LEVEL ONE TIME DAILY   ziprasidone (GEODON) 80 MG capsule TAKE 1 CAPSULE TWICE DAILY   No facility-administered medications prior to visit.    Review of Systems  Constitutional:  Negative for appetite change, fatigue and unexpected weight change.  Eyes:  Negative for visual disturbance.  Respiratory:  Negative for cough, chest tightness and shortness of breath.   Cardiovascular:  Negative for chest pain and leg swelling.  Gastrointestinal:  Negative for abdominal pain, diarrhea, nausea and vomiting.  Endocrine: Negative for cold intolerance and heat intolerance.  Neurological:  Negative for dizziness, light-headedness and headaches.    {Labs  Heme  Chem  Endocrine  Serology  Results Review (optional):23779}   Objective    LMP  (LMP Unknown) Comment: pt states she has not had period in the past year  BP Readings from Last 3 Encounters:  07/29/22 114/77  06/24/22 124/75  03/21/22 135/85    Wt Readings from Last 3 Encounters:  07/29/22 222 lb (100.7 kg)  06/24/22 227 lb 1.6 oz (103 kg)  03/21/22 249 lb (112.9 kg)      Physical Exam  ***  No results found for any visits on 10/17/22.  Assessment & Plan     ***  No follow-ups on file.      {provider attestation***:1}   Lavon Paganini, MD  [facility] [phone] (phone) [phone] (fax)  [place]

## 2022-10-17 ENCOUNTER — Ambulatory Visit (INDEPENDENT_AMBULATORY_CARE_PROVIDER_SITE_OTHER): Payer: Medicare HMO | Admitting: Family Medicine

## 2022-10-17 ENCOUNTER — Encounter: Payer: Self-pay | Admitting: Family Medicine

## 2022-10-17 ENCOUNTER — Other Ambulatory Visit: Payer: Self-pay | Admitting: Psychiatry

## 2022-10-17 VITALS — BP 102/69 | HR 88 | Temp 97.9°F | Resp 16 | Wt 221.6 lb

## 2022-10-17 DIAGNOSIS — E1159 Type 2 diabetes mellitus with other circulatory complications: Secondary | ICD-10-CM | POA: Diagnosis not present

## 2022-10-17 DIAGNOSIS — I10 Essential (primary) hypertension: Secondary | ICD-10-CM | POA: Diagnosis not present

## 2022-10-17 DIAGNOSIS — E1165 Type 2 diabetes mellitus with hyperglycemia: Secondary | ICD-10-CM

## 2022-10-17 DIAGNOSIS — Z23 Encounter for immunization: Secondary | ICD-10-CM | POA: Diagnosis not present

## 2022-10-17 DIAGNOSIS — I152 Hypertension secondary to endocrine disorders: Secondary | ICD-10-CM

## 2022-10-17 LAB — POCT GLYCOSYLATED HEMOGLOBIN (HGB A1C)
Est. average glucose Bld gHb Est-mCnc: 131
Hemoglobin A1C: 6.2 % — AB (ref 4.0–5.6)

## 2022-10-17 MED ORDER — LISINOPRIL 5 MG PO TABS: 5.0000 mg | ORAL_TABLET | Freq: Every day | ORAL | 1 refills | Status: DC

## 2022-10-17 NOTE — Assessment & Plan Note (Signed)
BMI 39 and assoc with HTN, HLD, DM Discussed importance of healthy weight management Discussed diet and exercise

## 2022-10-17 NOTE — Assessment & Plan Note (Signed)
Chronic and well-controlled Up-to-date on vaccines and screenings Continue current medications Getting Jardiance through manufacture supply Follow-up in 3 months and repeat A1c

## 2022-10-17 NOTE — Progress Notes (Unsigned)
Bilateral ECT

## 2022-10-17 NOTE — Assessment & Plan Note (Signed)
Blood pressure is low normal today Patient is asymptomatic with intermittent dizziness Decrease lisinopril to 5 mg daily She does occasionally have higher blood pressure on ECT days, so she can continue lisinopril 10 mg daily on those days once monthly Reviewed recent metabolic panel

## 2022-10-18 ENCOUNTER — Ambulatory Visit: Payer: Self-pay | Admitting: Anesthesiology

## 2022-10-18 ENCOUNTER — Ambulatory Visit
Admission: RE | Admit: 2022-10-18 | Discharge: 2022-10-18 | Disposition: A | Discharge status: Home / Self Care | Payer: Medicare HMO | Source: Ambulatory Visit | Attending: Psychiatry | Admitting: Psychiatry

## 2022-10-18 ENCOUNTER — Encounter: Payer: Self-pay | Admitting: Anesthesiology

## 2022-10-18 DIAGNOSIS — R569 Unspecified convulsions: Secondary | ICD-10-CM | POA: Insufficient documentation

## 2022-10-18 DIAGNOSIS — F319 Bipolar disorder, unspecified: Secondary | ICD-10-CM | POA: Diagnosis not present

## 2022-10-18 DIAGNOSIS — E119 Type 2 diabetes mellitus without complications: Secondary | ICD-10-CM | POA: Diagnosis not present

## 2022-10-18 DIAGNOSIS — F339 Major depressive disorder, recurrent, unspecified: Secondary | ICD-10-CM | POA: Insufficient documentation

## 2022-10-18 DIAGNOSIS — F332 Major depressive disorder, recurrent severe without psychotic features: Secondary | ICD-10-CM | POA: Diagnosis not present

## 2022-10-18 DIAGNOSIS — Z01812 Encounter for preprocedural laboratory examination: Secondary | ICD-10-CM | POA: Diagnosis not present

## 2022-10-18 DIAGNOSIS — G4730 Sleep apnea, unspecified: Secondary | ICD-10-CM | POA: Diagnosis not present

## 2022-10-18 DIAGNOSIS — E1140 Type 2 diabetes mellitus with diabetic neuropathy, unspecified: Secondary | ICD-10-CM | POA: Diagnosis not present

## 2022-10-18 DIAGNOSIS — I10 Essential (primary) hypertension: Secondary | ICD-10-CM | POA: Diagnosis not present

## 2022-10-18 LAB — GLUCOSE, CAPILLARY: Glucose-Capillary: 113 mg/dL — ABNORMAL HIGH (ref 70–99)

## 2022-10-18 MED ORDER — LABETALOL HCL 5 MG/ML IV SOLN
INTRAVENOUS | Status: DC | PRN
  Administered 2022-10-18: 10 mg via INTRAVENOUS

## 2022-10-18 MED ORDER — SUCCINYLCHOLINE CHLORIDE 200 MG/10ML IV SOSY
PREFILLED_SYRINGE | INTRAVENOUS | Status: DC | PRN
  Administered 2022-10-18: 100 mg via INTRAVENOUS

## 2022-10-18 MED ORDER — KETOROLAC TROMETHAMINE 30 MG/ML IJ SOLN: 30.0000 mg | Freq: Once | INTRAMUSCULAR | Status: AC

## 2022-10-18 MED ORDER — LABETALOL HCL 5 MG/ML IV SOLN
INTRAVENOUS | Status: AC
  Filled 2022-10-18: qty 4

## 2022-10-18 MED ORDER — ESMOLOL HCL 100 MG/10ML IV SOLN
INTRAVENOUS | Status: DC | PRN
  Administered 2022-10-18: 30 mg via INTRAVENOUS

## 2022-10-18 MED ORDER — GLYCOPYRROLATE 0.2 MG/ML IJ SOLN: 0.4000 mg | Freq: Once | INTRAMUSCULAR | Status: AC

## 2022-10-18 MED ORDER — GLYCOPYRROLATE 0.2 MG/ML IJ SOLN
INTRAMUSCULAR | Status: AC
  Administered 2022-10-18: 0.4 mg via INTRAVENOUS
  Filled 2022-10-18: qty 2

## 2022-10-18 MED ORDER — KETAMINE HCL 10 MG/ML IJ SOLN
INTRAMUSCULAR | Status: DC | PRN
  Administered 2022-10-18: 40 mg via INTRAVENOUS

## 2022-10-18 MED ORDER — PROPOFOL 10 MG/ML IV BOLUS
INTRAVENOUS | Status: DC | PRN
  Administered 2022-10-18: 40 mg via INTRAVENOUS

## 2022-10-18 MED ORDER — KETAMINE HCL 50 MG/5ML IJ SOSY
PREFILLED_SYRINGE | INTRAMUSCULAR | Status: AC
  Filled 2022-10-18: qty 5

## 2022-10-18 MED ORDER — ESMOLOL HCL 100 MG/10ML IV SOLN
INTRAVENOUS | Status: AC
  Filled 2022-10-18: qty 10

## 2022-10-18 MED ORDER — SODIUM CHLORIDE 0.9 % IV SOLN
500.0000 mL | Freq: Once | INTRAVENOUS | Status: AC
  Administered 2022-10-18: 500 mL via INTRAVENOUS

## 2022-10-18 MED ORDER — KETOROLAC TROMETHAMINE 30 MG/ML IJ SOLN
INTRAMUSCULAR | Status: AC
  Administered 2022-10-18: 30 mg via INTRAVENOUS
  Filled 2022-10-18: qty 1

## 2022-10-18 NOTE — Transfer of Care (Signed)
Immediate Anesthesia Transfer of Care Note  Patient: Melinda [name]  Procedure(s) Performed: ECT TX  Patient Location: PACU  Anesthesia Type:General  Level of Consciousness: awake  Airway & Oxygen Therapy: Patient Spontanous Breathing  Post-op Assessment: Report given to RN and Post -op Vital signs reviewed and stable  Post vital signs: Reviewed  Last Vitals:  Vitals Value Taken Time  BP    Temp    Pulse 98 10/18/22 1256  Resp 9 10/18/22 1256  SpO2 95 % 10/18/22 1256  Vitals shown include unvalidated device data.  Last Pain:  Vitals:   10/18/22 1035  TempSrc: Oral  PainSc: 0-No pain         Complications: No notable events documented.

## 2022-10-18 NOTE — H&P (Signed)
Melinda [name] is an 51 y.o. female.   Chief Complaint: No new complaint.  Recent stresses somewhat down but not severely depressed HPI: Recurrent depression with some reliance on ECT  Past Medical History:  Diagnosis Date   Depression    Diabetes mellitus without complication ([place])    Diabetic peripheral neuropathy ([place]) 03/07/15   Diabetic peripheral neuropathy ([place]) 03/07/15   Diabetic peripheral neuropathy ([place]) 03/07/15   GERD (gastroesophageal reflux disease)    Hypercholesterolemia 03/07/15   Hypertension    Obesity 03/07/15   Personality disorder ([place]) 03/07/15   Sinus tachycardia 03/07/15   history of   Suicidal thoughts 03/07/15    Past Surgical History:  Procedure Laterality Date   COLONOSCOPY WITH PROPOFOL N/A 12/01/2018   Procedure: COLONOSCOPY WITH PROPOFOL;  Surgeon: Jonathon Bellows, MD;  Location: [facility] ENDOSCOPY;  Service: Gastroenterology;  Laterality: N/A;   electroconvulsion therapy  03/07/15    Family History  Problem Relation Age of Onset   Diabetes Mother    Hypertension Father    Breast cancer Neg Hx    Colon cancer Neg Hx    Social History:  reports that she has never smoked. She has never used smokeless tobacco. She reports that she does not drink alcohol and does not use drugs.  Allergies:  Allergies  Allergen Reactions   Prednisone     Increases blood sugar    (Not in a hospital admission)   Results for orders placed or performed during the hospital encounter of 10/18/22 (from the past 48 hour(s))  Glucose, capillary     Status: Abnormal   Collection Time: 10/18/22 10:47 AM  Result Value Ref Range   Glucose-Capillary 113 (H) 70 - 99 mg/dL    Comment: Glucose reference range applies only to samples taken after fasting for at least 8 hours.   Comment 1 Notify RN    Comment 2 Document in Chart    *Note: Due to a large number of results and/or encounters for the requested time period, some results have not been displayed. A complete set of results  can be found in Results Review.   No results found.  Review of Systems  Constitutional: Negative.   HENT: Negative.    Eyes: Negative.   Respiratory: Negative.    Cardiovascular: Negative.   Gastrointestinal: Negative.   Musculoskeletal: Negative.   Skin: Negative.   Neurological: Negative.   Psychiatric/Behavioral: Negative.      Blood pressure 128/69, pulse 80, temperature 98.6 F (37 C), temperature source Oral, resp. rate 18, height '5\' 4"'$  (1.626 m), weight 100.2 kg, SpO2 95 %. Physical Exam Vitals reviewed.  Constitutional:      Appearance: She is well-developed.  HENT:     Head: Normocephalic and atraumatic.  Eyes:     Conjunctiva/sclera: Conjunctivae normal.     Pupils: Pupils are equal, round, and reactive to light.  Cardiovascular:     Heart sounds: Normal heart sounds.  Pulmonary:     Effort: Pulmonary effort is normal.  Abdominal:     Palpations: Abdomen is soft.  Musculoskeletal:        General: Normal range of motion.     Cervical back: Normal range of motion.  Skin:    General: Skin is warm and dry.  Neurological:     General: No focal deficit present.     Mental Status: She is alert.  Psychiatric:        Mood and Affect: Mood normal.  Thought Content: Thought content normal.      Assessment/Plan Treatment today follow-up in a month  [name], MD 10/18/2022, 5:04 PM

## 2022-10-18 NOTE — Procedures (Signed)
ECT SERVICES Physician's Interval Evaluation & Treatment Note  Patient Identification: Melinda [name] MRN:  [id number] Date of Evaluation:  10/18/2022 TX #: [id number]  MADRS:   MMSE:   P.E. Findings:  No change to physical exam  Psychiatric Interval Note:  A little bit down recently about Christmas but no major depression  Subjective:  Patient is a 51 y.o. female seen for evaluation for Electroconvulsive Therapy. No specific new complaint  Treatment Summary:   '[]'$   Right Unilateral             '[x]'$  Bilateral   % Energy : 1.0 ms 35%   Impedance: 1430 ohms  Seizure Energy Index: No reading  Postictal Suppression Index: No reading  Seizure Concordance Index: 91%  Medications  Pre Shock: Robinul 0.4 mg propofol 40 mg ketamine 40 mg succinylcholine 100 mg Toradol 30 mg labetalol 20 mg esmolol 20 mg  Post Shock:    Seizure Duration: EMG 35 seconds EEG 83 seconds   Comments: Follow-up 4 weeks  Lungs:  '[x]'$   Clear to auscultation               '[]'$  Other:   Heart:    '[x]'$   Regular rhythm             '[]'$  irregular rhythm    '[x]'$   Previous H&P reviewed, patient examined and there are NO CHANGES                 '[]'$   Previous H&P reviewed, patient examined and there are changes noted.   [name], MD 12/8/20235:05 PM

## 2022-10-18 NOTE — Anesthesia Postprocedure Evaluation (Signed)
Anesthesia Post Note  Patient: Amra M Lortz  Procedure(s) Performed: ECT TX  Patient location during evaluation: PACU Anesthesia Type: General Level of consciousness: awake and alert, oriented and patient cooperative Pain management: pain level controlled Vital Signs Assessment: post-procedure vital signs reviewed and stable Respiratory status: spontaneous breathing, nonlabored ventilation and respiratory function stable Cardiovascular status: blood pressure returned to baseline and stable Postop Assessment: adequate PO intake Anesthetic complications: no   No notable events documented.   Last Vitals:  Vitals:   10/18/22 1310 10/18/22 1320  BP: 95/64 130/71  Pulse:  84  Resp:  20  Temp:  37 C  SpO2: 94% 95%    Last Pain:  Vitals:   10/18/22 1320  TempSrc:   PainSc: 0-No pain                 Darrin Nipper

## 2022-10-18 NOTE — Anesthesia Preprocedure Evaluation (Addendum)
Anesthesia Evaluation  Patient identified by MRN, date of birth, ID band Patient awake    Reviewed: Allergy & Precautions, NPO status , Patient's Chart, lab work & pertinent test results  History of Anesthesia Complications Negative for: history of anesthetic complications  Airway Mallampati: II   Neck ROM: Full    Dental  (+) Missing   Pulmonary sleep apnea    Pulmonary exam normal breath sounds clear to auscultation       Cardiovascular hypertension, Normal cardiovascular exam Rhythm:Regular Rate:Normal     Neuro/Psych  PSYCHIATRIC DISORDERS  Depression Bipolar Disorder    Neuromuscular disease (peripheral neuropathy)    GI/Hepatic ,GERD  ,,  Endo/Other  diabetes, Type 2  Class 3 obesity  Renal/GU negative Renal ROS     Musculoskeletal   Abdominal   Peds  Hematology negative hematology ROS (+)   Anesthesia Other Findings   Reproductive/Obstetrics                             Anesthesia Physical Anesthesia Plan  ASA: 3  Anesthesia Plan: General   Post-op Pain Management:    Induction: Intravenous  PONV Risk Score and Plan: 3 and TIVA and Treatment may vary due to age or medical condition  Airway Management Planned: Natural Airway  Additional Equipment:   Intra-op Plan:   Post-operative Plan:   Informed Consent: I have reviewed the patients History and Physical, chart, labs and discussed the procedure including the risks, benefits and alternatives for the proposed anesthesia with the patient or authorized representative who has indicated his/her understanding and acceptance.       Plan Discussed with: CRNA  Anesthesia Plan Comments: (Serial consent on chart.  LMA/GETA backup discussed.  Patient consented for risks of anesthesia including but not limited to:  - adverse reactions to medications - damage to eyes, teeth, lips or other oral mucosa - nerve damage due to  positioning  - sore throat or hoarseness - damage to heart, brain, nerves, lungs, other parts of body or loss of life  Informed patient about role of CRNA in peri- and intra-operative care.  Patient voiced understanding.)       Anesthesia Quick Evaluation

## 2022-11-13 DIAGNOSIS — Z0100 Encounter for examination of eyes and vision without abnormal findings: Secondary | ICD-10-CM | POA: Diagnosis not present

## 2022-11-14 ENCOUNTER — Other Ambulatory Visit: Payer: Self-pay | Admitting: Psychiatry

## 2022-11-14 MED ORDER — BUPROPION HCL ER (XL) 300 MG PO TB24: 300.0000 mg | ORAL_TABLET | Freq: Every day | ORAL | 1 refills | Status: DC

## 2022-11-21 ENCOUNTER — Other Ambulatory Visit: Payer: Self-pay | Admitting: Psychiatry

## 2022-11-22 ENCOUNTER — Encounter
Admission: RE | Admit: 2022-11-22 | Discharge: 2022-11-22 | Disposition: A | Discharge status: Home / Self Care | Payer: Medicare HMO | Source: Ambulatory Visit | Attending: Psychiatry | Admitting: Psychiatry

## 2022-11-22 ENCOUNTER — Encounter: Payer: Self-pay | Admitting: Registered Nurse

## 2022-11-22 DIAGNOSIS — F332 Major depressive disorder, recurrent severe without psychotic features: Secondary | ICD-10-CM | POA: Insufficient documentation

## 2022-11-22 DIAGNOSIS — E669 Obesity, unspecified: Secondary | ICD-10-CM | POA: Diagnosis not present

## 2022-11-22 DIAGNOSIS — Z6839 Body mass index (BMI) 39.0-39.9, adult: Secondary | ICD-10-CM | POA: Diagnosis not present

## 2022-11-22 DIAGNOSIS — R569 Unspecified convulsions: Secondary | ICD-10-CM | POA: Insufficient documentation

## 2022-11-22 DIAGNOSIS — I10 Essential (primary) hypertension: Secondary | ICD-10-CM | POA: Diagnosis not present

## 2022-11-22 LAB — GLUCOSE, CAPILLARY
Glucose-Capillary: 101 mg/dL — ABNORMAL HIGH (ref 70–99)
Glucose-Capillary: 118 mg/dL — ABNORMAL HIGH (ref 70–99)

## 2022-11-22 MED ORDER — GLYCOPYRROLATE 0.2 MG/ML IJ SOLN
INTRAMUSCULAR | Status: AC
  Administered 2022-11-22: 0.4 mg via INTRAVENOUS
  Filled 2022-11-22: qty 2

## 2022-11-22 MED ORDER — OXYCODONE HCL 5 MG PO TABS: 5.0000 mg | ORAL_TABLET | Freq: Once | ORAL | Status: DC | PRN

## 2022-11-22 MED ORDER — KETAMINE HCL 10 MG/ML IJ SOLN
INTRAMUSCULAR | Status: DC | PRN
  Administered 2022-11-22: 40 mg via INTRAVENOUS

## 2022-11-22 MED ORDER — SODIUM CHLORIDE 0.9 % IV SOLN
500.0000 mL | Freq: Once | INTRAVENOUS | Status: AC
  Administered 2022-11-22: 500 mL via INTRAVENOUS

## 2022-11-22 MED ORDER — OXYCODONE HCL 5 MG/5ML PO SOLN: 5.0000 mg | Freq: Once | ORAL | Status: DC | PRN

## 2022-11-22 MED ORDER — HYDROMORPHONE HCL 1 MG/ML IJ SOLN: 0.2500 mg | INTRAMUSCULAR | Status: DC | PRN

## 2022-11-22 MED ORDER — ESMOLOL HCL 100 MG/10ML IV SOLN
INTRAVENOUS | Status: DC | PRN
  Administered 2022-11-22: 30 mg via INTRAVENOUS

## 2022-11-22 MED ORDER — LABETALOL HCL 5 MG/ML IV SOLN
INTRAVENOUS | Status: DC | PRN
  Administered 2022-11-22: 10 mg via INTRAVENOUS

## 2022-11-22 MED ORDER — KETAMINE HCL 50 MG/5ML IJ SOSY
PREFILLED_SYRINGE | INTRAMUSCULAR | Status: AC
  Filled 2022-11-22: qty 5

## 2022-11-22 MED ORDER — PROPOFOL 10 MG/ML IV BOLUS
INTRAVENOUS | Status: DC | PRN
  Administered 2022-11-22: 40 mg via INTRAVENOUS

## 2022-11-22 MED ORDER — KETOROLAC TROMETHAMINE 30 MG/ML IJ SOLN
INTRAMUSCULAR | Status: AC
  Administered 2022-11-22: 30 mg via INTRAVENOUS
  Filled 2022-11-22: qty 1

## 2022-11-22 MED ORDER — SUCCINYLCHOLINE CHLORIDE 200 MG/10ML IV SOSY
PREFILLED_SYRINGE | INTRAVENOUS | Status: DC | PRN
  Administered 2022-11-22: 100 mg via INTRAVENOUS

## 2022-11-22 MED ORDER — GLYCOPYRROLATE 0.2 MG/ML IJ SOLN: 0.4000 mg | Freq: Once | INTRAMUSCULAR | Status: AC

## 2022-11-22 MED ORDER — SODIUM CHLORIDE 0.9 % IV SOLN: INTRAVENOUS | Status: DC | PRN

## 2022-11-22 MED ORDER — PROPOFOL 10 MG/ML IV BOLUS
INTRAVENOUS | Status: AC
  Filled 2022-11-22: qty 20

## 2022-11-22 MED ORDER — KETOROLAC TROMETHAMINE 30 MG/ML IJ SOLN: 30.0000 mg | Freq: Once | INTRAMUSCULAR | Status: AC

## 2022-11-22 NOTE — Anesthesia Postprocedure Evaluation (Signed)
Anesthesia Post Note  Patient: Melinda [name]  Procedure(s) Performed: ECT TX  Patient location during evaluation: PACU Anesthesia Type: General Level of consciousness: awake and alert Pain management: pain level controlled Vital Signs Assessment: post-procedure vital signs reviewed and stable Respiratory status: spontaneous breathing, nonlabored ventilation, respiratory function stable and patient connected to nasal cannula oxygen Cardiovascular status: blood pressure returned to baseline and stable Postop Assessment: no apparent nausea or vomiting Anesthetic complications: no   No notable events documented.   Last Vitals:  Vitals:   11/22/22 1215 11/22/22 1226  BP: (!) 146/99 (!) 145/99  Pulse: 95 94  Resp: (!) 23 20  Temp: 36.5 C 36.7 C  SpO2: 92%     Last Pain:  Vitals:   11/22/22 1226  TempSrc: Temporal  PainSc: 0-No pain                 Precious Haws Taegen Lennox

## 2022-11-22 NOTE — Anesthesia Preprocedure Evaluation (Signed)
Anesthesia Evaluation  Patient identified by MRN, date of birth, ID band Patient awake    Reviewed: Allergy & Precautions, NPO status , Patient's Chart, lab work & pertinent test results  History of Anesthesia Complications Negative for: history of anesthetic complications  Airway Mallampati: II   Neck ROM: Full    Dental  (+) Missing   Pulmonary sleep apnea    Pulmonary exam normal breath sounds clear to auscultation       Cardiovascular hypertension, Normal cardiovascular exam Rhythm:Regular Rate:Normal     Neuro/Psych  PSYCHIATRIC DISORDERS  Depression Bipolar Disorder    Neuromuscular disease (peripheral neuropathy)    GI/Hepatic ,GERD  ,,  Endo/Other  diabetes, Type 2  Class 3 obesity  Renal/GU negative Renal ROS     Musculoskeletal   Abdominal   Peds  Hematology negative hematology ROS (+)   Anesthesia Other Findings   Reproductive/Obstetrics                              Anesthesia Physical Anesthesia Plan  ASA: 3  Anesthesia Plan: General   Post-op Pain Management:    Induction: Intravenous  PONV Risk Score and Plan: 3 and TIVA and Treatment may vary due to age or medical condition  Airway Management Planned: Natural Airway  Additional Equipment:   Intra-op Plan:   Post-operative Plan:   Informed Consent: I have reviewed the patients History and Physical, chart, labs and discussed the procedure including the risks, benefits and alternatives for the proposed anesthesia with the patient or authorized representative who has indicated his/her understanding and acceptance.       Plan Discussed with: CRNA  Anesthesia Plan Comments: (Serial consent on chart.  LMA/GETA backup discussed.  Patient consented for risks of anesthesia including but not limited to:  - adverse reactions to medications - damage to eyes, teeth, lips or other oral mucosa - nerve damage due to  positioning  - sore throat or hoarseness - damage to heart, brain, nerves, lungs, other parts of body or loss of life  Informed patient about role of CRNA in peri- and intra-operative care.  Patient voiced understanding.)        Anesthesia Quick Evaluation

## 2022-11-22 NOTE — Transfer of Care (Signed)
Immediate Anesthesia Transfer of Care Note  Patient: Melinda [name]  Procedure(s) Performed: ECT TX  Patient Location: PACU  Anesthesia Type:General  Level of Consciousness: awake, alert , and oriented  Airway & Oxygen Therapy: Patient Spontanous Breathing  Post-op Assessment: Report given to RN and Post -op Vital signs reviewed and stable  Post vital signs: Reviewed and stable  Last Vitals:  Vitals Value Taken Time  BP 148/99 11/22/22 1203  Temp    Pulse 96 11/22/22 1206  Resp 20 11/22/22 1206  SpO2 92 % 11/22/22 1206  Vitals shown include unvalidated device data.  Last Pain:  Vitals:   11/22/22 1111  TempSrc:   PainSc: 0-No pain         Complications: No notable events documented.

## 2022-11-27 NOTE — Procedures (Signed)
ECT SERVICES Physician's Interval Evaluation & Treatment Note  Patient Identification: Melinda [name] MRN:  [id number] Date of Evaluation:  11/22/2022 TX #: [id number]  MADRS:   MMSE:   P.E. Findings:  Unremarkable physical exam no change from baseline  Psychiatric Interval Note:  Mood stable depression mild  Subjective:  Patient is a 52 y.o. female seen for evaluation for Electroconvulsive Therapy. No specific new complaint  Treatment Summary:   '[]'$   Right Unilateral             '[x]'$  Bilateral   % Energy : 1.0 ms 35%   Impedance: 1920 ohms  Seizure Energy Index: 6694 V squared  Postictal Suppression Index: Good but not read because of lead malfunction  Seizure Concordance Index: 90%  Medications  Pre Shock: Robinul 0.4 mg Toradol 30 mg Brevital 70 mg succinylcholine 100 mg esmolol 20 mg labetalol 20 mg  Post Shock:    Seizure Duration: 33 seconds EMG 81 seconds EEG   Comments: Follow-up 4 weeks  Lungs:  '[x]'$   Clear to auscultation               '[]'$  Other:   Heart:    '[x]'$   Regular rhythm             '[]'$  irregular rhythm    '[x]'$   Previous H&P reviewed, patient examined and there are NO CHANGES                 '[]'$   Previous H&P reviewed, patient examined and there are changes noted.   [name], MD 1/12/20241:15 PM

## 2022-11-27 NOTE — H&P (Signed)
Melinda [name] is an 52 y.o. female.   Chief Complaint: Patient has no new complaints.  Mood has been stable HPI: Recurrent depression with good response to maintenance ECT  Past Medical History:  Diagnosis Date   Depression    Diabetes mellitus without complication ([place])    Diabetic peripheral neuropathy ([place]) 03/07/15   Diabetic peripheral neuropathy ([place]) 03/07/15   Diabetic peripheral neuropathy ([place]) 03/07/15   GERD (gastroesophageal reflux disease)    Hypercholesterolemia 03/07/15   Hypertension    Obesity 03/07/15   Personality disorder ([place]) 03/07/15   Sinus tachycardia 03/07/15   history of   Suicidal thoughts 03/07/15    Past Surgical History:  Procedure Laterality Date   COLONOSCOPY WITH PROPOFOL N/A 12/01/2018   Procedure: COLONOSCOPY WITH PROPOFOL;  Surgeon: Melinda Bellows, MD;  Location: [facility] ENDOSCOPY;  Service: Gastroenterology;  Laterality: N/A;   electroconvulsion therapy  03/07/15    Family History  Problem Relation Age of Onset   Diabetes Mother    Hypertension Father    Breast cancer Neg Hx    Colon cancer Neg Hx    Social History:  reports that she has never smoked. She has never used smokeless tobacco. She reports that she does not drink alcohol and does not use drugs.  Allergies:  Allergies  Allergen Reactions   Prednisone     Increases blood sugar    (Not in a hospital admission)   No results found. However, due to the size of the patient record, not all encounters were searched. Please check Results Review for a complete set of results. No results found.  Review of Systems  Constitutional: Negative.   HENT: Negative.    Eyes: Negative.   Respiratory: Negative.    Cardiovascular: Negative.   Gastrointestinal: Negative.   Musculoskeletal: Negative.   Skin: Negative.   Neurological: Negative.   Psychiatric/Behavioral: Negative.      Blood pressure (!) 140/89, pulse 92, temperature 98.2 F (36.8 C), temperature source Oral, resp. rate 18,  height '5\' 3"'$  (1.6 m), weight 101.1 kg, SpO2 92 %. Physical Exam Vitals and nursing note reviewed.  Constitutional:      Appearance: She is well-developed.  HENT:     Head: Normocephalic and atraumatic.  Eyes:     Conjunctiva/sclera: Conjunctivae normal.     Pupils: Pupils are equal, round, and reactive to light.  Cardiovascular:     Heart sounds: Normal heart sounds.  Pulmonary:     Effort: Pulmonary effort is normal.  Abdominal:     Palpations: Abdomen is soft.  Musculoskeletal:        General: Normal range of motion.     Cervical back: Normal range of motion.  Skin:    General: Skin is warm and dry.  Neurological:     Mental Status: She is alert.  Psychiatric:        Mood and Affect: Mood normal.        Thought Content: Thought content normal.      Assessment/Plan Treatment today followed up by maintenance follow-up 4 weeks  Melinda Berthold, MD 11/22/2022, 1:14 PM

## 2022-12-06 ENCOUNTER — Other Ambulatory Visit: Payer: Self-pay | Admitting: Family Medicine

## 2022-12-06 DIAGNOSIS — M25512 Pain in left shoulder: Secondary | ICD-10-CM

## 2022-12-10 ENCOUNTER — Ambulatory Visit (INDEPENDENT_AMBULATORY_CARE_PROVIDER_SITE_OTHER): Payer: Medicare HMO

## 2022-12-10 VITALS — Wt 221.0 lb

## 2022-12-10 DIAGNOSIS — Z0000 Encounter for general adult medical examination without abnormal findings: Secondary | ICD-10-CM

## 2022-12-10 NOTE — Patient Instructions (Signed)
Melinda [name] , Thank you for taking time to come for your Medicare Wellness Visit. I appreciate your ongoing commitment to your health goals. Please review the following plan we discussed and let me know if I can assist you in the future.   These are the goals we discussed:  Goals      DIET - EAT MORE FRUITS AND VEGETABLES     DIET - INCREASE WATER INTAKE     Prevent falls     Recommend to remove any items from the home that may cause slips or trips.        This is a list of the screening recommended for you and due dates:  Health Maintenance  Topic Date Due   DTaP/Tdap/Td vaccine (1 - Tdap) Never done   Zoster (Shingles) Vaccine (1 of 2) Never done   COVID-19 Vaccine (4 - 2023-24 season) 12/12/2022   Mammogram  02/02/2023   Yearly kidney health urinalysis for diabetes  03/22/2023   Complete foot exam   03/22/2023   Hemoglobin A1C  04/18/2023   Eye exam for diabetics  05/30/2023   Yearly kidney function blood test for diabetes  06/25/2023   Pap Smear  11/13/2023   Medicare Annual Wellness Visit  12/11/2023   Colon Cancer Screening  12/01/2028   Flu Shot  Completed   Hepatitis C Screening: USPSTF Recommendation to screen - Ages 18-79 yo.  Completed   HIV Screening  Completed   HPV Vaccine  Aged Out    Advanced directives: no  Conditions/risks identified: none  Next appointment: Follow up in one year for your annual wellness visit. 12/15/23 @ 1 pm by phone  Preventive Care 40-64 Years, Female Preventive care refers to lifestyle choices and visits with your health care provider that can promote health and wellness. What does preventive care include? A yearly physical exam. This is also called an annual well check. Dental exams once or twice a year. Routine eye exams. Ask your health care provider how often you should have your eyes checked. Personal lifestyle choices, including: Daily care of your teeth and gums. Regular physical activity. Eating a healthy diet. Avoiding  tobacco and drug use. Limiting alcohol use. Practicing safe sex. Taking low-dose aspirin daily starting at age 46. Taking vitamin and mineral supplements as recommended by your health care provider. What happens during an annual well check? The services and screenings done by your health care provider during your annual well check will depend on your age, overall health, lifestyle risk factors, and family history of disease. Counseling  Your health care provider may ask you questions about your: Alcohol use. Tobacco use. Drug use. Emotional well-being. Home and relationship well-being. Sexual activity. Eating habits. Work and work Statistician. Method of birth control. Menstrual cycle. Pregnancy history. Screening  You may have the following tests or measurements: Height, weight, and BMI. Blood pressure. Lipid and cholesterol levels. These may be checked every 5 years, or more frequently if you are over 12 years old. Skin check. Lung cancer screening. You may have this screening every year starting at age 24 if you have a 30-pack-year history of smoking and currently smoke or have quit within the past 15 years. Fecal occult blood test (FOBT) of the stool. You may have this test every year starting at age 57. Flexible sigmoidoscopy or colonoscopy. You may have a sigmoidoscopy every 5 years or a colonoscopy every 10 years starting at age 87. Hepatitis C blood test. Hepatitis B blood test. Sexually transmitted  disease (STD) testing. Diabetes screening. This is done by checking your blood sugar (glucose) after you have not eaten for a while (fasting). You may have this done every 1-3 years. Mammogram. This may be done every 1-2 years. Talk to your health care provider about when you should start having regular mammograms. This may depend on whether you have a family history of breast cancer. BRCA-related cancer screening. This may be done if you have a family history of breast, ovarian,  tubal, or peritoneal cancers. Pelvic exam and Pap test. This may be done every 3 years starting at age 50. Starting at age 71, this may be done every 5 years if you have a Pap test in combination with an HPV test. Bone density scan. This is done to screen for osteoporosis. You may have this scan if you are at high risk for osteoporosis. Discuss your test results, treatment options, and if necessary, the need for more tests with your health care provider. Vaccines  Your health care provider may recommend certain vaccines, such as: Influenza vaccine. This is recommended every year. Tetanus, diphtheria, and acellular pertussis (Tdap, Td) vaccine. You may need a Td booster every 10 years. Zoster vaccine. You may need this after age 52. Pneumococcal 13-valent conjugate (PCV13) vaccine. You may need this if you have certain conditions and were not previously vaccinated. Pneumococcal polysaccharide (PPSV23) vaccine. You may need one or two doses if you smoke cigarettes or if you have certain conditions. Talk to your health care provider about which screenings and vaccines you need and how often you need them. This information is not intended to replace advice given to you by your health care provider. Make sure you discuss any questions you have with your health care provider. Document Released: 11/24/2015 Document Revised: 07/17/2016 Document Reviewed: 08/29/2015 Elsevier Interactive Patient Education  2017 [place] Prevention in the Home Falls can cause injuries. They can happen to people of all ages. There are many things you can do to make your home safe and to help prevent falls. What can I do on the outside of my home? Regularly fix the edges of walkways and driveways and fix any cracks. Remove anything that might make you trip as you walk through a door, such as a raised step or threshold. Trim any bushes or trees on the path to your home. Use bright outdoor lighting. Clear any  walking paths of anything that might make someone trip, such as rocks or tools. Regularly check to see if handrails are loose or broken. Make sure that both sides of any steps have handrails. Any raised decks and porches should have guardrails on the edges. Have any leaves, snow, or ice cleared regularly. Use sand or salt on walking paths during winter. Clean up any spills in your garage right away. This includes oil or grease spills. What can I do in the bathroom? Use night lights. Install grab bars by the toilet and in the tub and shower. Do not use towel bars as grab bars. Use non-skid mats or decals in the tub or shower. If you need to sit down in the shower, use a plastic, non-slip stool. Keep the floor dry. Clean up any water that spills on the floor as soon as it happens. Remove soap buildup in the tub or shower regularly. Attach bath mats securely with double-sided non-slip rug tape. Do not have throw rugs and other things on the floor that can make you trip. What can  I do in the bedroom? Use night lights. Make sure that you have a light by your bed that is easy to reach. Do not use any sheets or blankets that are too big for your bed. They should not hang down onto the floor. Have a firm chair that has side arms. You can use this for support while you get dressed. Do not have throw rugs and other things on the floor that can make you trip. What can I do in the kitchen? Clean up any spills right away. Avoid walking on wet floors. Keep items that you use a lot in easy-to-reach places. If you need to reach something above you, use a strong step stool that has a grab bar. Keep electrical cords out of the way. Do not use floor polish or wax that makes floors slippery. If you must use wax, use non-skid floor wax. Do not have throw rugs and other things on the floor that can make you trip. What can I do with my stairs? Do not leave any items on the stairs. Make sure that there are  handrails on both sides of the stairs and use them. Fix handrails that are broken or loose. Make sure that handrails are as long as the stairways. Check any carpeting to make sure that it is firmly attached to the stairs. Fix any carpet that is loose or worn. Avoid having throw rugs at the top or bottom of the stairs. If you do have throw rugs, attach them to the floor with carpet tape. Make sure that you have a light switch at the top of the stairs and the bottom of the stairs. If you do not have them, ask someone to add them for you. What else can I do to help prevent falls? Wear shoes that: Do not have high heels. Have rubber bottoms. Are comfortable and fit you well. Are closed at the toe. Do not wear sandals. If you use a stepladder: Make sure that it is fully opened. Do not climb a closed stepladder. Make sure that both sides of the stepladder are locked into place. Ask someone to hold it for you, if possible. Clearly mark and make sure that you can see: Any grab bars or handrails. First and last steps. Where the edge of each step is. Use tools that help you move around (mobility aids) if they are needed. These include: Canes. Walkers. Scooters. Crutches. Turn on the lights when you go into a dark area. Replace any light bulbs as soon as they burn out. Set up your furniture so you have a clear path. Avoid moving your furniture around. If any of your floors are uneven, fix them. If there are any pets around you, be aware of where they are. Review your medicines with your doctor. Some medicines can make you feel dizzy. This can increase your chance of falling. Ask your doctor what other things that you can do to help prevent falls. This information is not intended to replace advice given to you by your health care provider. Make sure you discuss any questions you have with your health care provider. Document Released: 08/24/2009 Document Revised: 04/04/2016 Document Reviewed:  12/02/2014 Elsevier Interactive Patient Education  2017 [employer].

## 2022-12-10 NOTE — Progress Notes (Signed)
Virtual Visit via Telephone Note  I connected with  Melinda [name] on 12/10/22 at  8:15 AM EST by telephone and verified that I am speaking with the correct person using two identifiers.  Location: Patient: home Provider: [facility] Persons participating in the virtual visit: [place]   I discussed the limitations, risks, security and privacy concerns of performing an evaluation and management service by telephone and the availability of in person appointments. The patient expressed understanding and agreed to proceed.  Interactive audio and video telecommunications were attempted between this nurse and patient, however failed, due to patient having technical difficulties OR patient did not have access to video capability.  We continued and completed visit with audio only.  Some vital signs may be absent or patient reported.   Dionisio David, LPN  Subjective:   Melinda [name] is a 51 y.o. female who presents for Medicare Annual (Subsequent) preventive examination.  Review of Systems     Cardiac Risk Factors include: advanced age (>108mn, >>40women);diabetes mellitus     Objective:    There were no vitals filed for this visit. There is no height or weight on file to calculate BMI.     12/10/2022    8:23 AM 12/05/2021    9:50 AM 11/28/2020    8:40 AM 12/01/2018    8:00 AM  Advanced Directives  Does Patient Have a Medical Advance Directive? No No No No  Would patient like information on creating a medical advance directive? No - Patient declined;Yes (Inpatient - patient defers creating a medical advance directive at this time - Information given) No - Patient declined No - Patient declined No - Patient declined    Current Medications (verified) Outpatient Encounter Medications as of 12/10/2022  Medication Sig   atorvastatin (LIPITOR) 10 MG tablet TAKE 1 TABLET EVERY DAY   Blood Glucose Monitoring Suppl (TRUE METRIX METER) w/Device KIT USE AS DIRECTED AS NEEDED   FOR  DIABETES  MELLITUS  TYPE 2   buPROPion (WELLBUTRIN XL) 300 MG 24 hr tablet Take 1 tablet (300 mg total) by mouth daily.   empagliflozin (JARDIANCE) 25 MG TABS tablet Take 1 tablet (25 mg total) by mouth daily before breakfast.   escitalopram (LEXAPRO) 20 MG tablet Take 1 tablet (20 mg total) by mouth daily.   lisinopril (ZESTRIL) 5 MG tablet Take 1 tablet (5 mg total) by mouth daily. May take 10 mg on days that you have ECT.   meloxicam (MOBIC) 7.5 MG tablet TAKE 1 TABLET EVERY DAY   metFORMIN (GLUCOPHAGE-XR) 500 MG 24 hr tablet TAKE 1 TABLET (500 MG TOTAL) BY MOUTH IN THE MORNING AND AT BEDTIME.   TRUE METRIX BLOOD GLUCOSE TEST test strip CHECK FASTING BLOOD SUGAR ONCE DAILY   TRUEplus Lancets 33G MISC CHECK BLOOD GLUCOSE  LEVEL ONE TIME DAILY   ziprasidone (GEODON) 80 MG capsule TAKE 1 CAPSULE TWICE DAILY   No facility-administered encounter medications on file as of 12/10/2022.    Allergies (verified) Prednisone   History: Past Medical History:  Diagnosis Date   Depression    Diabetes [place] ([place]    Diabetic peripheral neuropathy ([place] 03/07/15   Diabetic peripheral neuropathy ([place] 03/07/15   [place]abetic peripheral [place] ([place] 03/07/15   GERD (gastroesophageal reflux disease)    Hypercholesterolemia 03/07/15   Hypertension    Obesity 03/07/15   Personality disorder ([place] 03/07/15   Sinus tachycardia 03/07/15   history of   Suicidal thoughts 03/07/15   Past  Surgical History:  Procedure Laterality Date   COLONOSCOPY WITH PROPOFOL N/A 12/01/2018   Procedure: COLONOSCOPY WITH PROPOFOL;  Surgeon: Jonathon Bellows, MD;  Location: [facility] ENDOSCOPY;  Service: Gastroenterology;  Laterality: N/A;   electroconvulsion therapy  03/07/15   Family History  Problem Relation Age of Onset   Diabetes Mother    Hypertension Father    Breast cancer Neg Hx    Colon cancer Neg Hx    Social History   Socioeconomic History   Marital status: Single    Spouse name: Not on  file   Number of children: 1   Years of education: Network engineer degree   Highest education level: Some college, no degree  Occupational History   Occupation: Disabled  Tobacco Use   Smoking status: Never   Smokeless tobacco: Never  Vaping Use   Vaping Use: Never used  Substance and Sexual Activity   Alcohol use: No   Drug use: No   Sexual activity: Not Currently    Birth control/protection: Abstinence  Other Topics Concern   Not on file  Social History Narrative   Not on file   Social Determinants of Health   Financial Resource Strain: Medium Risk (12/10/2022)   Overall Financial Resource Strain (CARDIA)    Difficulty of Paying Living Expenses: Somewhat hard  Food Insecurity: No Food Insecurity (12/10/2022)   Hunger Vital Sign    Worried About Running Out of Food in the Last Year: Never true    Ran Out of Food in the Last Year: Never true  Transportation Needs: No Transportation Needs (12/10/2022)   PRAPARE - Hydrologist (Medical): No    Lack of Transportation (Non-Medical): No  Physical Activity: Inactive (12/10/2022)   Exercise Vital Sign    Days of Exercise per Week: 0 days    Minutes of Exercise per Session: 0 min  Stress: No Stress Concern Present (12/10/2022)   [place]    Feeling of Stress : Not at all  Social Connections: Socially Isolated (12/10/2022)   Social Connection and Isolation Panel [NHANES]    Frequency of Communication with Friends and Family: Once a week    Frequency of Social Gatherings with Friends and Family: Never    Attends Religious Services: Never    Marine scientist or Organizations: No    Attends Music therapist: Never    Marital Status: Never married    Tobacco Counseling Counseling given: Not Answered   Clinical Intake:  Pre-visit preparation completed: Yes  Pain : No/denies pain     Nutritional Risks: None Diabetes:  Yes CBG done?: No Did pt. bring in CBG monitor from home?: No  How often do you need to have someone help you when you read instructions, pamphlets, or other written materials from your doctor or pharmacy?: 1 - Never  Diabetic?yes Nutrition Risk Assessment:  Has the patient had any N/V/D within the last 2 months?  No  Does the patient have any non-healing wounds?  No  Has the patient had any unintentional weight loss or weight gain?  No   Diabetes:  Is the patient diabetic?  Yes  If diabetic, was a CBG obtained today?  No  Did the patient bring in their glucometer from home?  No  How often do you monitor your CBG's? Daily, every morning.   Financial Strains and Diabetes Management:  Are you having any financial strains with the device, your supplies or  your medication? No .  Does the patient want to be seen by Chronic Care Management for management of their diabetes?  No  Would the patient like to be referred to a Nutritionist or for Diabetic Management?  No   Diabetic Exams:  Diabetic Eye Exam: Completed 05/1922.  Pt has been advised about the importance in completing this exam.  Diabetic Foot Exam: Completed 03/21/22. Pt has been advised about the importance in completing this exam.    Interpreter Needed?: No  Information entered by :: Kirke Shaggy, LPN   Activities of Daily Living    12/10/2022    8:24 AM 07/29/2022    9:27 AM  In your present state of health, do you have any difficulty performing the following activities:  Hearing? 0 0  Vision? 0 0  Difficulty concentrating or making decisions? 0 0  Walking or climbing stairs? 1 0  Dressing or bathing? 0 0  Doing errands, shopping? 0 0  Preparing Food and eating ? N   Using the Toilet? N   In the past six months, have you accidently leaked urine? N   Do you have problems with loss of bowel control? N   Managing your Medications? N   Managing your Finances? N   Housekeeping or managing your Housekeeping? N      Patient Care Team: Virginia Crews, MD as PCP - General (Family Medicine) System, Provider Not In (Ophthalmology) Clapacs, Madie Reno, MD (Psychiatry)  Indicate any recent Medical Services you may have received from other than [facility] providers in the past year (date may be approximate).     Assessment:   This is a routine wellness examination for Misha.  Hearing/Vision screen Hearing Screening - Comments:: No aids Vision Screening - Comments:: Wears glasses- [place]  Dietary issues and exercise activities discussed: Current Exercise Habits: The patient does not participate in regular exercise at present   Goals Addressed             This Visit's Progress    DIET - INCREASE WATER INTAKE         Depression Screen    12/10/2022    8:19 AM 07/29/2022    9:26 AM 06/24/2022    9:27 AM 03/21/2022    8:19 AM 12/06/2021    9:52 AM 12/05/2021    9:48 AM 03/15/2021    8:29 AM  PHQ 2/9 Scores  PHQ - 2 Score 1 2 0 '2 4 2 2  '$ PHQ- 9 Score '2 6 4 5 12 5 5    '$ Fall Risk    12/10/2022    8:24 AM 07/29/2022    9:26 AM 06/24/2022    9:27 AM 12/06/2021    9:52 AM 12/05/2021    9:52 AM  Fall Risk   Falls in the past year? 0 0 0 0 0  Number falls in past yr: 0 0 0 0 0  Injury with Fall? 0 0 0 0 0  Risk for fall due to : No Fall Risks No Fall Risks  No Fall Risks No Fall Risks  Follow up Falls prevention discussed;Falls evaluation completed Falls evaluation completed  Falls evaluation completed Falls evaluation completed    FALL RISK PREVENTION PERTAINING TO THE HOME:  Any stairs in or around the home? Yes  If so, are there any without handrails? No  Home free of loose throw rugs in walkways, pet beds, electrical cords, etc? Yes  Adequate lighting in your home to reduce risk of falls? Yes  ASSISTIVE DEVICES UTILIZED TO PREVENT FALLS:  Life alert? No  Use of a cane, walker or w/c? No  Grab bars in the bathroom? No  Shower chair or bench in shower? No  Elevated toilet seat or  a handicapped toilet? No    Cognitive Function:    09/20/2022   11:30 AM 05/31/2022   10:45 AM 02/15/2022   12:52 PM 09/17/2021    2:19 PM 04/13/2021   11:49 AM  MMSE - Mini Mental State Exam  Orientation to time       Orientation to Place       Registration       Attention/ Calculation       Recall       Language- name 2 objects       Language- repeat       Language- follow 3 step command       Language- read & follow direction       Write a sentence       Copy design       Total score          Information is confidential and restricted. Go to Review Flowsheets to unlock data.        12/10/2022    8:28 AM  6CIT Screen  What Year? 0 points  What month? 0 points  What time? 0 points  Count back from 20 0 points  Months in reverse 4 points  Repeat phrase 6 points  Total Score 10 points    Immunizations Immunization History  Administered Date(s) Administered   Hepatitis B 12/16/2013   Influenza,inj,Quad PF,6+ Mos 07/19/2013, 10/06/2018, 11/16/2020, 12/06/2021, 10/17/2022   PFIZER Comirnaty(Gray Top)Covid-19 Tri-Sucrose Vaccine 10/17/2022   PFIZER(Purple Top)SARS-COV-2 Vaccination 11/16/2020, 12/07/2020   Pneumococcal Polysaccharide-23 12/16/2013    TDAP status: Due, Education has been provided regarding the importance of this vaccine. Advised may receive this vaccine at local pharmacy or Health Dept. Aware to provide a copy of the vaccination record if obtained from local pharmacy or Health Dept. Verbalized acceptance and understanding.  Flu Vaccine status: Up to date  Pneumococcal vaccine status: Due, Education has been provided regarding the importance of this vaccine. Advised may receive this vaccine at local pharmacy or Health Dept. Aware to provide a copy of the vaccination record if obtained from local pharmacy or Health Dept. Verbalized acceptance and understanding.  Covid-19 vaccine status: Completed vaccines  Qualifies for Shingles Vaccine? Yes   Zostavax  completed No   Shingrix Completed?: No.    Education has been provided regarding the importance of this vaccine. Patient has been advised to call insurance company to determine out of pocket expense if they have not yet received this vaccine. Advised may also receive vaccine at local pharmacy or Health Dept. Verbalized acceptance and understanding.  Screening Tests Health Maintenance  Topic Date Due   DTaP/Tdap/Td (1 - Tdap) Never done   Zoster Vaccines- Shingrix (1 of 2) Never done   COVID-19 Vaccine (4 - 2023-24 season) 12/12/2022   MAMMOGRAM  02/02/2023   Diabetic kidney evaluation - Urine ACR  03/22/2023   FOOT EXAM  03/22/2023   HEMOGLOBIN A1C  04/18/2023   OPHTHALMOLOGY EXAM  05/30/2023   Diabetic kidney evaluation - eGFR measurement  06/25/2023   PAP SMEAR-Modifier  11/13/2023   Medicare Annual Wellness (AWV)  12/11/2023   COLONOSCOPY (Pts 45-42yr Insurance coverage will need to be confirmed)  12/01/2028   INFLUENZA VACCINE  Completed   Hepatitis C Screening  Completed  HIV Screening  Completed   HPV VACCINES  Aged Out    Health Maintenance  Health Maintenance Due  Topic Date Due   DTaP/Tdap/Td (1 - Tdap) Never done   Zoster Vaccines- Shingrix (1 of 2) Never done   COVID-19 Vaccine (4 - 2023-24 season) 12/12/2022    Colorectal cancer screening: Type of screening: Colonoscopy. Completed 12/01/18. Repeat every 10 years  Mammogram status: Completed 02/01/22. Repeat every year- declined referral  Lung Cancer Screening: (Low Dose CT Chest recommended if Age 82-80 years, 30 pack-year currently smoking OR have quit w/in 15years.) does not qualify.     Additional Screening:  Hepatitis C Screening: does qualify; Completed 05/16/20  Vision Screening: Recommended annual ophthalmology exams for early detection of glaucoma and other disorders of the eye. Is the patient up to date with their annual eye exam?  Yes  Who is the provider or what is the name of the office in which  the patient attends annual eye exams? [place] If pt is not established with a provider, would they like to be referred to a provider to establish care? No .   Dental Screening: Recommended annual dental exams for proper oral hygiene  Community Resource Referral / Chronic Care Management: CRR required this visit?  No   CCM required this visit?  No      Plan:     I have personally reviewed and noted the following in the patient's chart:   Medical and social history Use of alcohol, tobacco or illicit drugs  Current medications and supplements including opioid prescriptions. Patient is not currently taking opioid prescriptions. Functional ability and status Nutritional status Physical activity Advanced directives List of other physicians Hospitalizations, surgeries, and ER visits in previous 12 months Vitals Screenings to include cognitive, depression, and falls Referrals and appointments  In addition, I have reviewed and discussed with patient certain preventive protocols, quality metrics, and best practice recommendations. A written personalized care plan for preventive services as well as general preventive health recommendations were provided to patient.     Dionisio David, LPN   03/07/622   Nurse Notes: none

## 2022-12-19 ENCOUNTER — Other Ambulatory Visit: Payer: Self-pay | Admitting: Psychiatry

## 2022-12-20 ENCOUNTER — Encounter: Payer: Self-pay | Admitting: Registered Nurse

## 2022-12-20 ENCOUNTER — Ambulatory Visit: Payer: Self-pay | Admitting: Registered Nurse

## 2022-12-20 ENCOUNTER — Encounter
Admission: RE | Admit: 2022-12-20 | Discharge: 2022-12-20 | Disposition: A | Discharge status: Home / Self Care | Payer: Medicare HMO | Source: Ambulatory Visit | Attending: Family Medicine | Admitting: Family Medicine

## 2022-12-20 DIAGNOSIS — F332 Major depressive disorder, recurrent severe without psychotic features: Secondary | ICD-10-CM | POA: Insufficient documentation

## 2022-12-20 DIAGNOSIS — I10 Essential (primary) hypertension: Secondary | ICD-10-CM | POA: Diagnosis not present

## 2022-12-20 DIAGNOSIS — E119 Type 2 diabetes mellitus without complications: Secondary | ICD-10-CM | POA: Diagnosis not present

## 2022-12-20 DIAGNOSIS — Z7984 Long term (current) use of oral hypoglycemic drugs: Secondary | ICD-10-CM | POA: Diagnosis not present

## 2022-12-20 LAB — GLUCOSE, CAPILLARY: Glucose-Capillary: 114 mg/dL — ABNORMAL HIGH (ref 70–99)

## 2022-12-20 MED ORDER — ONDANSETRON HCL 4 MG/2ML IJ SOLN: 4.0000 mg | Freq: Once | INTRAMUSCULAR | Status: DC | PRN

## 2022-12-20 MED ORDER — PROPOFOL 10 MG/ML IV BOLUS
INTRAVENOUS | Status: DC | PRN
  Administered 2022-12-20: 40 mg via INTRAVENOUS

## 2022-12-20 MED ORDER — GLYCOPYRROLATE 0.2 MG/ML IJ SOLN: 0.4000 mg | Freq: Once | INTRAMUSCULAR | Status: AC

## 2022-12-20 MED ORDER — SODIUM CHLORIDE 0.9 % IV SOLN: 500.0000 mL | Freq: Once | INTRAVENOUS | Status: AC

## 2022-12-20 MED ORDER — KETAMINE HCL 10 MG/ML IJ SOLN
INTRAMUSCULAR | Status: DC | PRN
  Administered 2022-12-20: 50 mg via INTRAVENOUS

## 2022-12-20 MED ORDER — SUCCINYLCHOLINE CHLORIDE 200 MG/10ML IV SOSY
PREFILLED_SYRINGE | INTRAVENOUS | Status: DC | PRN
  Administered 2022-12-20: 100 mg via INTRAVENOUS

## 2022-12-20 MED ORDER — FENTANYL CITRATE (PF) 100 MCG/2ML IJ SOLN: 25.0000 ug | INTRAMUSCULAR | Status: DC | PRN

## 2022-12-20 MED ORDER — KETOROLAC TROMETHAMINE 30 MG/ML IJ SOLN: 30.0000 mg | Freq: Once | INTRAMUSCULAR | Status: AC

## 2022-12-20 MED ORDER — LABETALOL HCL 5 MG/ML IV SOLN
INTRAVENOUS | Status: DC | PRN
  Administered 2022-12-20: 10 mg via INTRAVENOUS

## 2022-12-20 MED ORDER — KETOROLAC TROMETHAMINE 30 MG/ML IJ SOLN
INTRAMUSCULAR | Status: AC
  Administered 2022-12-20: 30 mg via INTRAVENOUS
  Filled 2022-12-20: qty 1

## 2022-12-20 MED ORDER — KETAMINE HCL 50 MG/5ML IJ SOSY
PREFILLED_SYRINGE | INTRAMUSCULAR | Status: AC
  Filled 2022-12-20: qty 5

## 2022-12-20 MED ORDER — ESMOLOL HCL-SODIUM CHLORIDE 2000 MG/100ML IV SOLN
INTRAVENOUS | Status: DC | PRN
  Administered 2022-12-20: 30 mg via INTRAVENOUS
  Administered 2022-12-20: 20 mg via INTRAVENOUS

## 2022-12-20 MED ORDER — GLYCOPYRROLATE 0.2 MG/ML IJ SOLN
INTRAMUSCULAR | Status: AC
  Administered 2022-12-20: 0.4 mg via INTRAVENOUS
  Filled 2022-12-20: qty 2

## 2022-12-20 NOTE — Anesthesia Preprocedure Evaluation (Signed)
Anesthesia Evaluation  Patient identified by MRN, date of birth, ID band Patient awake    Reviewed: Allergy & Precautions, NPO status , Patient's Chart, lab work & pertinent test results  Airway Mallampati: III  TM Distance: >3 FB Neck ROM: Full    Dental  (+) Missing, Chipped   Pulmonary neg pulmonary ROS, sleep apnea    Pulmonary exam normal  + decreased breath sounds      Cardiovascular hypertension, negative cardio ROS Normal cardiovascular exam Rhythm:Regular     Neuro/Psych    Depression Bipolar Disorder   negative neurological ROS  negative psych ROS   GI/Hepatic negative GI ROS, Neg liver ROS,GERD  Medicated,,  Endo/Other  negative endocrine ROSdiabetesHypothyroidism    Renal/GU negative Renal ROS  negative genitourinary   Musculoskeletal negative musculoskeletal ROS (+)    Abdominal  (+) + obese  Peds negative pediatric ROS (+)  Hematology negative hematology ROS (+)   Anesthesia Other Findings Past Medical History: No date: Depression No date: Diabetes mellitus without complication ([place]) 99991111: Diabetic peripheral neuropathy ([place]) 03/07/15: Diabetic peripheral neuropathy ([place]) 03/07/15: Diabetic peripheral neuropathy ([facility]) No date: GERD (gastroesophageal reflux disease) 03/07/15: Hypercholesterolemia No date: Hypertension 03/07/15: Obesity 03/07/15: Personality disorder ([place]) 03/07/15: Sinus tachycardia     Comment:  history of 03/07/15: Suicidal thoughts  Past Surgical History: 12/01/2018: COLONOSCOPY WITH PROPOFOL; N/A     Comment:  Procedure: COLONOSCOPY WITH PROPOFOL;  Surgeon: Jonathon Bellows, MD;  Location: [facility] ENDOSCOPY;  Service:               Gastroenterology;  Laterality: N/A; 03/07/15: electroconvulsion therapy  BMI    Body Mass Index: 38.44 kg/m      Reproductive/Obstetrics negative OB ROS                             Anesthesia  Physical Anesthesia Plan  ASA: 3  Anesthesia Plan: General   Post-op Pain Management:    Induction: Intravenous  PONV Risk Score and Plan: Propofol infusion and TIVA  Airway Management Planned: Natural Airway and Nasal Cannula  Additional Equipment:   Intra-op Plan:   Post-operative Plan:   Informed Consent: I have reviewed the patients History and Physical, chart, labs and discussed the procedure including the risks, benefits and alternatives for the proposed anesthesia with the patient or authorized representative who has indicated his/her understanding and acceptance.     Dental Advisory Given  Plan Discussed with: CRNA and Surgeon  Anesthesia Plan Comments:        Anesthesia Quick Evaluation

## 2022-12-20 NOTE — H&P (Signed)
Melinda [name] is an 52 y.o. female.   Chief Complaint: No new complaints HPI: Doing well.  Irritated at her son but that is a normal everyday occurrence.  No severe depression.  Past Medical History:  Diagnosis Date   Depression    Diabetes mellitus without complication ([place])    Diabetic peripheral neuropathy ([place]) 03/07/15   Diabetic peripheral neuropathy ([place]) 03/07/15   Diabetic peripheral neuropathy ([place]) 03/07/15   GERD (gastroesophageal reflux disease)    Hypercholesterolemia 03/07/15   Hypertension    Obesity 03/07/15   Personality disorder ([place]) 03/07/15   Sinus tachycardia 03/07/15   history of   Suicidal thoughts 03/07/15    Past Surgical History:  Procedure Laterality Date   COLONOSCOPY WITH PROPOFOL N/A 12/01/2018   Procedure: COLONOSCOPY WITH PROPOFOL;  Surgeon: Jonathon Bellows, MD;  Location: [facility] ENDOSCOPY;  Service: Gastroenterology;  Laterality: N/A;   electroconvulsion therapy  03/07/15    Family History  Problem Relation Age of Onset   Diabetes Mother    Hypertension Father    Breast cancer Neg Hx    Colon cancer Neg Hx    Social History:  reports that she has never smoked. She has never used smokeless tobacco. She reports that she does not drink alcohol and does not use drugs.  Allergies:  Allergies  Allergen Reactions   Prednisone     Increases blood sugar    (Not in a hospital admission)   Results for orders placed or performed during the hospital encounter of 12/20/22 (from the past 48 hour(s))  Glucose, capillary     Status: Abnormal   Collection Time: 12/20/22 11:20 AM  Result Value Ref Range   Glucose-Capillary 114 (H) 70 - 99 mg/dL    Comment: Glucose reference range applies only to samples taken after fasting for at least 8 hours.   Comment 1 Notify RN    Comment 2 Document in Chart    *Note: Due to a large number of results and/or encounters for the requested time period, some results have not been displayed. A complete set of results can be  found in Results Review.   No results found.  Review of Systems  Constitutional: Negative.   HENT: Negative.    Eyes: Negative.   Respiratory: Negative.    Cardiovascular: Negative.   Gastrointestinal: Negative.   Musculoskeletal: Negative.   Skin: Negative.   Neurological: Negative.   Psychiatric/Behavioral: Negative.      Blood pressure 118/76, pulse 95, temperature 98.9 F (37.2 C), temperature source Oral, resp. rate (!) 23, weight 98.4 kg, SpO2 98 %. Physical Exam Vitals and nursing note reviewed.  Constitutional:      Appearance: She is well-developed.  HENT:     Head: Normocephalic and atraumatic.  Eyes:     Conjunctiva/sclera: Conjunctivae normal.     Pupils: Pupils are equal, round, and reactive to light.  Cardiovascular:     Heart sounds: Normal heart sounds.  Pulmonary:     Effort: Pulmonary effort is normal.  Abdominal:     Palpations: Abdomen is soft.  Musculoskeletal:        General: Normal range of motion.     Cervical back: Normal range of motion.  Skin:    General: Skin is warm and dry.  Neurological:     General: No focal deficit present.     Mental Status: She is alert.  Psychiatric:        Mood and Affect: Mood normal.  Thought Content: Thought content normal.        Judgment: Judgment normal.      Assessment/Plan Treatment and follow-up in 4 weeks.  [name], MD 12/20/2022, 4:37 PM

## 2022-12-20 NOTE — Transfer of Care (Signed)
Immediate Anesthesia Transfer of Care Note  Patient: Melinda [name]  Procedure(s) Performed: ECT TX  Patient Location: PACU  Anesthesia Type:General  Level of Consciousness: drowsy  Airway & Oxygen Therapy: Patient Spontanous Breathing  Post-op Assessment: Report given to RN and Post -op Vital signs reviewed and stable  Post vital signs: Reviewed and stable  Last Vitals:  Vitals Value Taken Time  BP 150/109 12/20/22 1318  Temp    Pulse 100 12/20/22 1320  Resp 15 12/20/22 1320  SpO2 98 % 12/20/22 1320  Vitals shown include unvalidated device data.  Last Pain:  Vitals:   12/20/22 1116  TempSrc:   PainSc: 0-No pain         Complications: No notable events documented.

## 2022-12-20 NOTE — Procedures (Signed)
ECT SERVICES Physician's Interval Evaluation & Treatment Note  Patient Identification: EZELLE [name] MRN:  [id number] Date of Evaluation:  12/20/2022 TX #: [id number]  MADRS:   MMSE:   P.E. Findings:  No change to physical exam  Psychiatric Interval Note:  Mood good no depression  Subjective:  Patient is a 52 y.o. female seen for evaluation for Electroconvulsive Therapy. No complaint  Treatment Summary:   []$   Right Unilateral             [x]$  Bilateral   % Energy : 1.0 ms 35%   Impedance: 1640 ohms  Seizure Energy Index: 7397 V squared  Postictal Suppression Index: 76%  Seizure Concordance Index: 97%  Medications  Pre Shock: Robinul 0.4 mg Toradol 30 mg labetalol 20 mg esmolol 20 mg Brevital 70 mg succinylcholine 100 mg  Post Shock:    Seizure Duration: 29 seconds EMG 74 seconds EEG   Comments: Follow-up in a month  Lungs:  [x]$   Clear to auscultation               []$  Other:   Heart:    [x]$   Regular rhythm             []$  irregular rhythm    [x]$   Previous H&P reviewed, patient examined and there are NO CHANGES                 []$   Previous H&P reviewed, patient examined and there are changes noted.   Melinda Berthold, MD 2/9/20244:41 PM

## 2022-12-25 NOTE — Anesthesia Postprocedure Evaluation (Signed)
Anesthesia Post Note  Patient: Melinda [name]  Procedure(s) Performed: ECT TX  Patient location during evaluation: PACU Anesthesia Type: General Level of consciousness: awake Pain management: satisfactory to patient Vital Signs Assessment: post-procedure vital signs reviewed and stable Respiratory status: spontaneous breathing and respiratory function stable Cardiovascular status: stable Anesthetic complications: no  No notable events documented.   Last Vitals:  Vitals:   12/20/22 1347 12/20/22 1428  BP:  118/76  Pulse: 96 95  Resp: (!) 23 (!) 23  Temp:  37.2 C  SpO2: 98%     Last Pain:  Vitals:   12/20/22 1428  TempSrc: Oral  PainSc: 0-No pain                 VAN STAVEREN,Kadarrius Yanke

## 2022-12-30 ENCOUNTER — Other Ambulatory Visit: Payer: Self-pay | Admitting: Family Medicine

## 2022-12-30 DIAGNOSIS — I10 Essential (primary) hypertension: Secondary | ICD-10-CM

## 2022-12-31 ENCOUNTER — Telehealth: Payer: Self-pay | Admitting: Family Medicine

## 2022-12-31 NOTE — Telephone Encounter (Signed)
Patient wants to know patient received free samples of Jaurdiance from the manufacturer and patient misplaced two bottles. She had three bottles and she took her last pills this morning. She was sent another prescription of Jaurdiance and it won't be here for another 10 days. Patient wants to know if she should continue her Metformin until the Jaurdiance. Please follow up with patient.

## 2023-01-02 NOTE — Telephone Encounter (Signed)
Yes continue other meds, including metformin until the jardiance comes in

## 2023-01-02 NOTE — Telephone Encounter (Signed)
Left detailed message on voicemail.  

## 2023-01-10 ENCOUNTER — Other Ambulatory Visit: Payer: Self-pay | Admitting: Psychiatry

## 2023-01-10 MED ORDER — ESCITALOPRAM OXALATE 20 MG PO TABS: 20.0000 mg | ORAL_TABLET | Freq: Every day | ORAL | 1 refills | Status: DC

## 2023-01-15 NOTE — Progress Notes (Unsigned)
I,Melinda [name],acting as a Education administrator for Lavon Paganini, MD.,have documented all relevant documentation on the behalf of Lavon Paganini, MD,as directed by  Lavon Paganini, MD while in the presence of Lavon Paganini, MD.    Complete physical exam   Patient: Melinda [name]   DOB: Mar 18, 1971   52 y.o. Female  MRN: [id number] Visit Date: 01/16/2023  Today's healthcare provider: Lavon Paganini, MD   Chief Complaint  Patient presents with   Annual Exam   Subjective    Melinda [name] is a 52 y.o. female who presents today for a complete physical exam.  She reports consuming a general diet. The patient does not participate in regular exercise at present. She generally feels fairly well. She reports sleeping fairly well. She does have additional problems to discuss today.  HPI  12/10/22 [facility]  Patient C/O left big toe pain x a few weeks. She denies any rash, swelling. She reports pain in legs and hands on and off. Describes pain as sharp worse with wiggling toes  Past Medical History:  Diagnosis Date   Depression    Diabetes mellitus without complication ([place])    Diabetic peripheral neuropathy ([place]) 03/07/15   Diabetic peripheral neuropathy ([place]) 03/07/15   Diabetic peripheral neuropathy ([place]) 03/07/15   GERD (gastroesophageal reflux disease)    Hypercholesterolemia 03/07/15   Hypertension    Obesity 03/07/15   Personality disorder ([place]) 03/07/15   Sinus tachycardia 03/07/15   history of   Suicidal thoughts 03/07/15   Past Surgical History:  Procedure Laterality Date   COLONOSCOPY WITH PROPOFOL N/A 12/01/2018   Procedure: COLONOSCOPY WITH PROPOFOL;  Surgeon: Jonathon Bellows, MD;  Location: [facility] ENDOSCOPY;  Service: Gastroenterology;  Laterality: N/A;   electroconvulsion therapy  03/07/15   Social History   Socioeconomic History   Marital status: Single    Spouse name: Not on file   Number of children: 1   Years of  education: Network engineer degree   Highest education level: Some college, no degree  Occupational History   Occupation: Disabled  Tobacco Use   Smoking status: Never   Smokeless tobacco: Never  Vaping Use   Vaping Use: Never used  Substance and Sexual Activity   Alcohol use: No   Drug use: No   Sexual activity: Not Currently    Birth control/protection: Abstinence  Other Topics Concern   Not on file  Social History Narrative   Not on file   Social Determinants of Health   Financial Resource Strain: Medium Risk (12/10/2022)   Overall Financial Resource Strain (CARDIA)    Difficulty of Paying Living Expenses: Somewhat hard  Food Insecurity: No Food Insecurity (12/10/2022)   Hunger Vital Sign    Worried About Running Out of Food in the Last Year: Never true    Ran Out of Food in the Last Year: Never true  Transportation Needs: No Transportation Needs (12/10/2022)   PRAPARE - Hydrologist (Medical): No    Lack of Transportation (Non-Medical): No  Physical Activity: Inactive (12/10/2022)   Exercise Vital Sign    Days of Exercise per Week: 0 days    Minutes of Exercise per Session: 0 min  Stress: No Stress Concern Present (12/10/2022)   [place]    Feeling of Stress : Not at all  Social Connections: Socially Isolated (12/10/2022)   Social Connection and Isolation Panel [NHANES]  Frequency of Communication with Friends and Family: Once a week    Frequency of Social Gatherings with Friends and Family: Never    Attends Religious Services: Never    Marine scientist or Organizations: No    Attends Archivist Meetings: Never    Marital Status: Never married  Intimate Partner Violence: Not At Risk (12/10/2022)   Humiliation, Afraid, Rape, and Kick questionnaire    Fear of Current or Ex-Partner: No    Emotionally Abused: No    Physically Abused: No    Sexually Abused: No    Family Status  Relation Name Status   Mother Phineas Semen Alive   Father Donnetta Simpers ( [place]) Alive   Sister Facilities manager   Sister  Alive   Sister  Alive   Neg Hx  (Not Specified)   Family History  Problem Relation Age of Onset   Diabetes Mother    Hypertension Father    Breast cancer Neg Hx    Colon cancer Neg Hx    Allergies  Allergen Reactions   Prednisone     Increases blood sugar    Patient Care Team: Virginia Crews, MD as PCP - General (Family Medicine) System, Provider Not In (Ophthalmology) Clapacs, Madie Reno, MD (Psychiatry)   Medications: Outpatient Medications Prior to Visit  Medication Sig   atorvastatin (LIPITOR) 10 MG tablet TAKE 1 TABLET EVERY DAY   Blood Glucose Monitoring Suppl (TRUE METRIX METER) w/Device KIT USE AS DIRECTED AS NEEDED  FOR  DIABETES  MELLITUS  TYPE 2   buPROPion (WELLBUTRIN XL) 300 MG 24 hr tablet Take 1 tablet (300 mg total) by mouth daily.   empagliflozin (JARDIANCE) 25 MG TABS tablet Take 25 mg by mouth daily.   escitalopram (LEXAPRO) 20 MG tablet Take 1 tablet (20 mg total) by mouth daily.   lisinopril (ZESTRIL) 5 MG tablet TAKE 1 TABLET EVERY DAY, MAY TAKE 2 TABLETS ON DAYS THAT YOU HAVE ECT   meloxicam (MOBIC) 7.5 MG tablet TAKE 1 TABLET EVERY DAY   metFORMIN (GLUCOPHAGE-XR) 500 MG 24 hr tablet TAKE 1 TABLET (500 MG TOTAL) BY MOUTH IN THE MORNING AND AT BEDTIME.   TRUE METRIX BLOOD GLUCOSE TEST test strip CHECK FASTING BLOOD SUGAR ONCE DAILY   TRUEplus Lancets 33G MISC CHECK BLOOD GLUCOSE  LEVEL ONE TIME DAILY   ziprasidone (GEODON) 80 MG capsule TAKE 1 CAPSULE TWICE DAILY   No facility-administered medications prior to visit.    Review of Systems  Constitutional:  Positive for diaphoresis and fatigue.  Eyes:  Positive for redness.  Genitourinary:  Positive for vaginal discharge.  Musculoskeletal:  Positive for myalgias.  All other systems reviewed and are negative.   Last CBC Lab Results  Component Value Date   WBC 7.0  06/24/2022   HGB 12.0 06/24/2022   HCT 37.0 06/24/2022   MCV 80 06/24/2022   MCH 25.9 (L) 06/24/2022   RDW 12.1 06/24/2022   PLT 325 0000000   Last metabolic panel Lab Results  Component Value Date   GLUCOSE 98 06/24/2022   NA 142 06/24/2022   K 3.8 06/24/2022   CL 103 06/24/2022   CO2 24 06/24/2022   BUN 8 06/24/2022   CREATININE 0.74 06/24/2022   EGFR 97 06/24/2022   CALCIUM 9.4 06/24/2022   PROT 6.6 06/24/2022   ALBUMIN 4.1 06/24/2022   LABGLOB 2.5 06/24/2022   AGRATIO 1.6 06/24/2022   BILITOT 0.2 06/24/2022   ALKPHOS 124 (H) 06/24/2022   AST 14  06/24/2022   ALT 13 06/24/2022   ANIONGAP 3 (L) 05/08/2013   Last lipids Lab Results  Component Value Date   CHOL 136 03/21/2022   HDL 46 03/21/2022   LDLCALC 71 03/21/2022   TRIG 102 03/21/2022   CHOLHDL 3.0 03/21/2022   Last hemoglobin A1c Lab Results  Component Value Date   HGBA1C 6.2 (A) 10/17/2022   Last thyroid functions Lab Results  Component Value Date   TSH 4.470 12/06/2021   T4TOTAL 10.2 03/15/2021      Objective    BP 117/78 (BP Location: Right Arm, Patient Position: Sitting, Cuff Size: Large)   Pulse 92   Temp 98 F (36.7 C) (Temporal)   Resp 16   Ht '5\' 1"'$  (1.549 m)   Wt 224 lb 12.8 oz (102 kg)   LMP  (LMP Unknown) Comment: pt states she has not had period in the past year  SpO2 99%   BMI 42.48 kg/m  BP Readings from Last 3 Encounters:  01/16/23 117/78  10/17/22 102/69  07/29/22 114/77   Wt Readings from Last 3 Encounters:  01/16/23 224 lb 12.8 oz (102 kg)  12/10/22 221 lb (100.2 kg)  10/17/22 221 lb 9.6 oz (100.5 kg)       Physical Exam Vitals reviewed.  Constitutional:      General: She is not in acute distress.    Appearance: Normal appearance. She is well-developed. She is not diaphoretic.  HENT:     Head: Normocephalic and atraumatic.     Right Ear: Tympanic membrane, ear canal and external ear normal.     Left Ear: Tympanic membrane, ear canal and external ear normal.      Nose: Nose normal.     Mouth/Throat:     Mouth: Mucous membranes are moist.     Pharynx: Oropharynx is clear. No oropharyngeal exudate.  Eyes:     General: No scleral icterus.    Conjunctiva/sclera: Conjunctivae normal.     Pupils: Pupils are equal, round, and reactive to light.  Neck:     Thyroid: No thyromegaly.  Cardiovascular:     Rate and Rhythm: Normal rate and regular rhythm.     Pulses: Normal pulses.     Heart sounds: Normal heart sounds. No murmur heard. Pulmonary:     Effort: Pulmonary effort is normal. No respiratory distress.     Breath sounds: Normal breath sounds. No wheezing or rales.  Abdominal:     General: There is no distension.     Palpations: Abdomen is soft.     Tenderness: There is no abdominal tenderness.  Musculoskeletal:        General: No deformity.     Cervical back: Neck supple.     Right lower leg: No edema.     Left lower leg: No edema.  Lymphadenopathy:     Cervical: No cervical adenopathy.  Skin:    General: Skin is warm and dry.     Findings: No rash.  Neurological:     Mental Status: She is alert and oriented to person, place, and time. Mental status is at baseline.     Sensory: No sensory deficit.     Motor: No weakness.     Gait: Gait normal.  Psychiatric:        Mood and Affect: Mood normal.        Behavior: Behavior normal.        Thought Content: Thought content normal.       Last depression screening  scores    12/10/2022    8:19 AM 07/29/2022    9:26 AM 06/24/2022    9:27 AM  PHQ 2/9 Scores  PHQ - 2 Score 1 2 0  PHQ- 9 Score '2 6 4   '$ Last fall risk screening    12/10/2022    8:24 AM  [place] in the past year? 0  Number falls in past yr: 0  Injury with Fall? 0  Risk for fall due to : No Fall Risks  Follow up Falls prevention discussed;Falls evaluation completed   Last Audit-C alcohol use screening    12/10/2022    8:19 AM  Alcohol Use Disorder Test (AUDIT)  1. How often do you have a drink  containing alcohol? 0  2. How many drinks containing alcohol do you have on a typical day when you are drinking? 0  3. How often do you have six or more drinks on one occasion? 0  AUDIT-C Score 0   A score of 3 or more in women, and 4 or more in men indicates increased risk for alcohol abuse, EXCEPT if all of the points are from question 1   No results found for any visits on 01/16/23.  Assessment & Plan    Routine Health Maintenance and Physical Exam  Exercise Activities and Dietary recommendations  Goals      DIET - EAT MORE FRUITS AND VEGETABLES     DIET - INCREASE WATER INTAKE     Prevent falls     Recommend to remove any items from the home that may cause slips or trips.        Immunization History  Administered Date(s) Administered   Hepatitis B 12/16/2013   Influenza,inj,Quad PF,6+ Mos 07/19/2013, 10/06/2018, 11/16/2020, 12/06/2021, 10/17/2022   PFIZER Comirnaty(Gray Top)Covid-19 Tri-Sucrose Vaccine 10/17/2022   PFIZER(Purple Top)SARS-COV-2 Vaccination 11/16/2020, 12/07/2020   Pneumococcal Polysaccharide-23 12/16/2013    Health Maintenance  Topic Date Due   DTaP/Tdap/Td (1 - Tdap) Never done   Zoster Vaccines- Shingrix (1 of 2) Never done   COVID-19 Vaccine (4 - 2023-24 season) 12/12/2022   MAMMOGRAM  02/02/2023   Diabetic kidney evaluation - Urine ACR  03/22/2023   HEMOGLOBIN A1C  04/18/2023   OPHTHALMOLOGY EXAM  05/30/2023   Diabetic kidney evaluation - eGFR measurement  06/25/2023   PAP SMEAR-Modifier  11/13/2023   Medicare Annual Wellness (AWV)  12/11/2023   FOOT EXAM  01/16/2024   COLONOSCOPY (Pts 45-81yr Insurance coverage will need to be confirmed)  12/01/2028   INFLUENZA VACCINE  Completed   Hepatitis C Screening  Completed   HIV Screening  Completed   HPV VACCINES  Aged Out    Discussed health benefits of physical activity, and encouraged her to engage in regular exercise appropriate for her age and condition.  Problem List Items Addressed This  Visit       Cardiovascular and Mediastinum   Hypertension associated with diabetes ([place]    Well controlled Continue current medications Recheck metabolic panel F/u in 6 months       Relevant Medications   empagliflozin (JARDIANCE) 25 MG TABS tablet   Other Relevant Orders   Comprehensive metabolic panel   Hot flashes due to menopause    Ongoing issue Worsening Trial of gabapentin qhs pending ok from Dr CWeber Cooksas this can raise the seizure threshold and she is currently getting ECT        Endocrine   T2DM (type 2 diabetes mellitus) ([place]  Chronic and well-controlled Up-to-date on vaccines and screenings Foot exam and UACR today Continue current medications Getting Jardiance through manufacture supply      Relevant Medications   empagliflozin (JARDIANCE) 25 MG TABS tablet   Other Relevant Orders   Hemoglobin A1c   Urine Microalbumin w/creat. ratio   Hyperlipidemia associated with type 2 diabetes mellitus ([place])    Previously well controlled Continue statin Repeat FLP and CMP      Relevant Medications   empagliflozin (JARDIANCE) 25 MG TABS tablet   Other Relevant Orders   Lipid panel   Comprehensive metabolic panel   Subclinical hypothyroidism    Recheck TSH and free T4       Relevant Orders   TSH + free T4   Diabetic polyneuropathy associated with type 2 diabetes mellitus ([place])    New diagnosis Ongoing pain worse at night Trial of gabapentin pending approval in the setting of ECT      Relevant Medications   empagliflozin (JARDIANCE) 25 MG TABS tablet     Other   Morbid obesity with BMI of 40.0-44.9, adult (HCC) (Chronic)    Discussed importance of healthy weight management Discussed diet and exercise       Relevant Medications   empagliflozin (JARDIANCE) 25 MG TABS tablet   Other Visit Diagnoses     Encounter for annual physical exam    -  Primary   Relevant Orders   Lipid panel   Hemoglobin A1c   Comprehensive metabolic panel   TSH +  free T4   Breast cancer screening by mammogram       Relevant Orders   MM 3D SCREENING MAMMOGRAM BILATERAL BREAST        Return in about 6 months (around 07/19/2023) for chronic disease f/u.     I, Lavon Paganini, MD, have reviewed all documentation for this visit. The documentation on 01/16/23 for the exam, diagnosis, procedures, and orders are all accurate and complete.   Bacigalupo, Dionne Bucy, MD, MPH [facility]

## 2023-01-16 ENCOUNTER — Encounter: Payer: Self-pay | Admitting: Family Medicine

## 2023-01-16 ENCOUNTER — Ambulatory Visit (INDEPENDENT_AMBULATORY_CARE_PROVIDER_SITE_OTHER): Payer: Medicare HMO | Admitting: Family Medicine

## 2023-01-16 VITALS — BP 117/78 | HR 92 | Temp 98.0°F | Resp 16 | Ht 61.0 in | Wt 224.8 lb

## 2023-01-16 DIAGNOSIS — E038 Other specified hypothyroidism: Secondary | ICD-10-CM | POA: Diagnosis not present

## 2023-01-16 DIAGNOSIS — Z6841 Body Mass Index (BMI) 40.0 and over, adult: Secondary | ICD-10-CM

## 2023-01-16 DIAGNOSIS — Z0000 Encounter for general adult medical examination without abnormal findings: Secondary | ICD-10-CM

## 2023-01-16 DIAGNOSIS — Z1231 Encounter for screening mammogram for malignant neoplasm of breast: Secondary | ICD-10-CM

## 2023-01-16 DIAGNOSIS — E1159 Type 2 diabetes mellitus with other circulatory complications: Secondary | ICD-10-CM

## 2023-01-16 DIAGNOSIS — E1165 Type 2 diabetes mellitus with hyperglycemia: Secondary | ICD-10-CM

## 2023-01-16 DIAGNOSIS — I152 Hypertension secondary to endocrine disorders: Secondary | ICD-10-CM | POA: Diagnosis not present

## 2023-01-16 DIAGNOSIS — E1169 Type 2 diabetes mellitus with other specified complication: Secondary | ICD-10-CM

## 2023-01-16 DIAGNOSIS — N951 Menopausal and female climacteric states: Secondary | ICD-10-CM

## 2023-01-16 DIAGNOSIS — E785 Hyperlipidemia, unspecified: Secondary | ICD-10-CM

## 2023-01-16 DIAGNOSIS — E1142 Type 2 diabetes mellitus with diabetic polyneuropathy: Secondary | ICD-10-CM | POA: Diagnosis not present

## 2023-01-16 NOTE — Assessment & Plan Note (Signed)
Previously well controlled Continue statin Repeat FLP and CMP  

## 2023-01-16 NOTE — Assessment & Plan Note (Signed)
Recheck TSH and free T4 

## 2023-01-16 NOTE — Assessment & Plan Note (Signed)
Ongoing issue Worsening Trial of gabapentin qhs pending ok from Dr Weber Cooks as this can raise the seizure threshold and she is currently getting ECT

## 2023-01-16 NOTE — Patient Instructions (Addendum)
Ask at the pharmacy about the tetanus shot and shingrix vaccine  Ask Dr Weber Cooks if Gabapentin would be ok with ECT (would start with '300mg'$  at bedtime) - for hot flashes and neuropathy - call us back if he says it is ok

## 2023-01-16 NOTE — Assessment & Plan Note (Signed)
Chronic and well-controlled Up-to-date on vaccines and screenings Foot exam and UACR today Continue current medications Getting Jardiance through manufacture supply

## 2023-01-16 NOTE — Assessment & Plan Note (Signed)
New diagnosis Ongoing pain worse at night Trial of gabapentin pending approval in the setting of ECT

## 2023-01-16 NOTE — Assessment & Plan Note (Signed)
Discussed importance of healthy weight management Discussed diet and exercise  

## 2023-01-16 NOTE — Assessment & Plan Note (Signed)
Well controlled Continue current medications Recheck metabolic panel F/u in 6 months  

## 2023-01-17 ENCOUNTER — Other Ambulatory Visit: Payer: Self-pay | Admitting: Psychiatry

## 2023-01-17 ENCOUNTER — Encounter: Payer: Self-pay | Admitting: Certified Registered Nurse Anesthetist

## 2023-01-17 ENCOUNTER — Ambulatory Visit: Payer: Self-pay | Admitting: Certified Registered Nurse Anesthetist

## 2023-01-17 ENCOUNTER — Ambulatory Visit
Admission: RE | Admit: 2023-01-17 | Discharge: 2023-01-17 | Disposition: A | Discharge status: Home / Self Care | Payer: Medicare HMO | Source: Ambulatory Visit | Attending: Psychiatry | Admitting: Psychiatry

## 2023-01-17 DIAGNOSIS — F339 Major depressive disorder, recurrent, unspecified: Secondary | ICD-10-CM | POA: Diagnosis not present

## 2023-01-17 DIAGNOSIS — F332 Major depressive disorder, recurrent severe without psychotic features: Secondary | ICD-10-CM | POA: Diagnosis not present

## 2023-01-17 DIAGNOSIS — I10 Essential (primary) hypertension: Secondary | ICD-10-CM | POA: Diagnosis not present

## 2023-01-17 LAB — GLUCOSE, CAPILLARY
Glucose-Capillary: 102 mg/dL — ABNORMAL HIGH (ref 70–99)
Glucose-Capillary: 123 mg/dL — ABNORMAL HIGH (ref 70–99)

## 2023-01-17 MED ORDER — PROPOFOL 10 MG/ML IV BOLUS
INTRAVENOUS | Status: DC | PRN
  Administered 2023-01-17: 40 mg via INTRAVENOUS

## 2023-01-17 MED ORDER — GLYCOPYRROLATE 0.2 MG/ML IJ SOLN
0.4000 mg | Freq: Once | INTRAMUSCULAR | Status: AC
  Administered 2023-01-17: 0.4 mg via INTRAVENOUS

## 2023-01-17 MED ORDER — SODIUM CHLORIDE 0.9 % IV SOLN: INTRAVENOUS | Status: DC | PRN

## 2023-01-17 MED ORDER — ONDANSETRON HCL 4 MG/2ML IJ SOLN: 4.0000 mg | Freq: Once | INTRAMUSCULAR | Status: DC | PRN

## 2023-01-17 MED ORDER — SODIUM CHLORIDE 0.9 % IV SOLN
500.0000 mL | Freq: Once | INTRAVENOUS | Status: AC
  Administered 2023-01-17: 500 mL via INTRAVENOUS

## 2023-01-17 MED ORDER — LABETALOL HCL 5 MG/ML IV SOLN
INTRAVENOUS | Status: DC | PRN
  Administered 2023-01-17: 10 mg via INTRAVENOUS

## 2023-01-17 MED ORDER — SUCCINYLCHOLINE CHLORIDE 200 MG/10ML IV SOSY
PREFILLED_SYRINGE | INTRAVENOUS | Status: DC | PRN
  Administered 2023-01-17: 100 mg via INTRAVENOUS

## 2023-01-17 MED ORDER — KETAMINE HCL 10 MG/ML IJ SOLN
INTRAMUSCULAR | Status: DC | PRN
  Administered 2023-01-17: 40 mg via INTRAVENOUS

## 2023-01-17 MED ORDER — KETOROLAC TROMETHAMINE 30 MG/ML IJ SOLN
30.0000 mg | Freq: Once | INTRAMUSCULAR | Status: AC
  Administered 2023-01-17: 30 mg via INTRAVENOUS

## 2023-01-17 MED ORDER — ESMOLOL HCL 100 MG/10ML IV SOLN
INTRAVENOUS | Status: DC | PRN
  Administered 2023-01-17: 30 mg via INTRAVENOUS

## 2023-01-17 NOTE — Transfer of Care (Signed)
Immediate Anesthesia Transfer of Care Note  Patient: Melinda [name]  Procedure(s) Performed: ECT TX  Patient Location: PACU  Anesthesia Type:General  Level of Consciousness: drowsy  Airway & Oxygen Therapy: Patient Spontanous Breathing and Patient connected to nasal cannula oxygen  Post-op Assessment: Report given to RN  Post vital signs: stable  Last Vitals:  Vitals Value Taken Time  BP    Temp    Pulse 85 01/17/23 1315  Resp 13 01/17/23 1315  SpO2 94 % 01/17/23 1315  Vitals shown include unvalidated device data.  Last Pain:  Vitals:   01/17/23 1134  TempSrc:   PainSc: 0-No pain         Complications: No notable events documented.

## 2023-01-17 NOTE — Anesthesia Preprocedure Evaluation (Signed)
Anesthesia Evaluation  Patient identified by MRN, date of birth, ID band Patient awake    Reviewed: Allergy & Precautions, NPO status , Patient's Chart, lab work & pertinent test results  Airway Mallampati: III  TM Distance: >3 FB Neck ROM: Full    Dental  (+) Missing, Chipped   Pulmonary sleep apnea     + decreased breath sounds      Cardiovascular hypertension, Pt. on medications Normal cardiovascular exam Rhythm:Regular Rate:Normal - Systolic murmurs    Neuro/Psych  PSYCHIATRIC DISORDERS Anxiety Depression Bipolar Disorder    Neuromuscular disease    GI/Hepatic Neg liver ROS,GERD  Medicated,,  Endo/Other  diabetesHypothyroidism  Morbid obesity  Renal/GU negative Renal ROS  negative genitourinary   Musculoskeletal negative musculoskeletal ROS (+)    Abdominal  (+) + obese  Peds negative pediatric ROS (+)  Hematology negative hematology ROS (+)   Anesthesia Other Findings Past Medical History: No date: Depression No date: Diabetes mellitus without complication ([place]) 99991111: Diabetic peripheral neuropathy ([place]) 03/07/15: Diabetic peripheral neuropathy ([place]) 03/07/15: Diabetic peripheral neuropathy (HCC) No date: GERD (gastroesophageal reflux disease) 03/07/15: Hypercholesterolemia No date: Hypertension 03/07/15: Obesity 03/07/15: Personality disorder ([place]) 03/07/15: Sinus tachycardia     Comment:  history of 03/07/15: Suicidal thoughts  Past Surgical History: 12/01/2018: COLONOSCOPY WITH PROPOFOL; N/A     Comment:  Procedure: COLONOSCOPY WITH PROPOFOL;  Surgeon: Jonathon Bellows, MD;  Location: [facility] ENDOSCOPY;  Service:               Gastroenterology;  Laterality: N/A; 03/07/15: electroconvulsion therapy  BMI    Body Mass Index: 38.44 kg/m      Reproductive/Obstetrics negative OB ROS                              Anesthesia Physical Anesthesia Plan  ASA: 3  Anesthesia  Plan: General   Post-op Pain Management: Minimal or no pain anticipated   Induction: Intravenous  PONV Risk Score and Plan: 3 and Propofol infusion and TIVA  Airway Management Planned: Mask  Additional Equipment: None  Intra-op Plan:   Post-operative Plan:   Informed Consent: I have reviewed the patients History and Physical, chart, labs and discussed the procedure including the risks, benefits and alternatives for the proposed anesthesia with the patient or authorized representative who has indicated his/her understanding and acceptance.     Dental Advisory Given  Plan Discussed with: CRNA and Surgeon  Anesthesia Plan Comments: (Discussed risks of anesthesia with patient, including PONV, muscle aches. Rare risks discussed as well, such as cardiorespiratory sequelae, need for airway intervention and its associated risks including lip/dental/eye damage and sore throat, and allergic reactions. Discussed the role of CRNA in patient's perioperative care. Patient understands. Patient informed about increased incidence of above perioperative risk due to high BMI. Patient understands.  )        Anesthesia Quick Evaluation

## 2023-01-17 NOTE — H&P (Signed)
Melinda [name] is an 52 y.o. female.   Chief Complaint: No chief complaint mood has been good feeling pretty stable HPI: History of recurrent depression  Past Medical History:  Diagnosis Date   Depression    Diabetes mellitus without complication ([place])    Diabetic peripheral neuropathy ([place]) 03/07/15   Diabetic peripheral neuropathy ([place]) 03/07/15   Diabetic peripheral neuropathy ([place]) 03/07/15   GERD (gastroesophageal reflux disease)    Hypercholesterolemia 03/07/15   Hypertension    Obesity 03/07/15   Personality disorder ([place]) 03/07/15   Sinus tachycardia 03/07/15   history of   Suicidal thoughts 03/07/15    Past Surgical History:  Procedure Laterality Date   COLONOSCOPY WITH PROPOFOL N/A 12/01/2018   Procedure: COLONOSCOPY WITH PROPOFOL;  Surgeon: Jonathon Bellows, MD;  Location: [facility] ENDOSCOPY;  Service: Gastroenterology;  Laterality: N/A;   electroconvulsion therapy  03/07/15    Family History  Problem Relation Age of Onset   Diabetes Mother    Hypertension Father    Breast cancer Neg Hx    Colon cancer Neg Hx    Social History:  reports that she has never smoked. She has never used smokeless tobacco. She reports that she does not drink alcohol and does not use drugs.  Allergies:  Allergies  Allergen Reactions   Prednisone     Increases blood sugar    (Not in a hospital admission)   Results for orders placed or performed during the hospital encounter of 01/17/23 (from the past 48 hour(s))  Glucose, capillary     Status: Abnormal   Collection Time: 01/17/23 11:12 AM  Result Value Ref Range   Glucose-Capillary 102 (H) 70 - 99 mg/dL    Comment: Glucose reference range applies only to samples taken after fasting for at least 8 hours.  Glucose, capillary     Status: Abnormal   Collection Time: 01/17/23  1:24 PM  Result Value Ref Range   Glucose-Capillary 123 (H) 70 - 99 mg/dL    Comment: Glucose reference range applies only to samples taken after fasting for at least 8  hours.   *Note: Due to a large number of results and/or encounters for the requested time period, some results have not been displayed. A complete set of results can be found in Results Review.   No results found.  Review of Systems  Constitutional: Negative.   HENT: Negative.    Eyes: Negative.   Respiratory: Negative.    Cardiovascular: Negative.   Gastrointestinal: Negative.   Musculoskeletal: Negative.   Skin: Negative.   Neurological: Negative.   Psychiatric/Behavioral: Negative.      Blood pressure (!) 140/89, pulse 80, temperature 98.8 F (37.1 C), temperature source Oral, resp. rate 18, weight 100.2 kg, SpO2 95 %. Physical Exam Vitals reviewed.  Constitutional:      Appearance: She is well-developed.  HENT:     Head: Normocephalic and atraumatic.  Eyes:     Conjunctiva/sclera: Conjunctivae normal.     Pupils: Pupils are equal, round, and reactive to light.  Cardiovascular:     Heart sounds: Normal heart sounds.  Pulmonary:     Effort: Pulmonary effort is normal.  Abdominal:     Palpations: Abdomen is soft.  Musculoskeletal:        General: Normal range of motion.     Cervical back: Normal range of motion.  Skin:    General: Skin is warm and dry.  Neurological:     General: No focal deficit present.  Mental Status: She is alert.  Psychiatric:        Mood and Affect: Mood normal.      Assessment/Plan Good response to ECT continue with follow-up every 4 weeks  [name], MD 01/17/2023, 3:59 PM

## 2023-01-17 NOTE — Anesthesia Postprocedure Evaluation (Signed)
Anesthesia Post Note  Patient: Melinda [name]  Procedure(s) Performed: ECT TX  Patient location during evaluation: PACU Anesthesia Type: General Level of consciousness: awake and alert Pain management: pain level controlled Vital Signs Assessment: post-procedure vital signs reviewed and stable Respiratory status: spontaneous breathing, nonlabored ventilation, respiratory function stable and patient connected to nasal cannula oxygen Cardiovascular status: blood pressure returned to baseline and stable Postop Assessment: no apparent nausea or vomiting Anesthetic complications: no   No notable events documented.   Last Vitals:  Vitals:   01/17/23 1315 01/17/23 1320  BP: (!) 153/100 (!) 148/89  Pulse: 89 94  Resp: 16 17  Temp: 36.8 C   SpO2: 96% 97%    Last Pain:  Vitals:   01/17/23 1320  TempSrc:   PainSc: Asleep                 Arita Miss

## 2023-01-17 NOTE — Procedures (Signed)
ECT SERVICES Physician's Interval Evaluation & Treatment Note  Patient Identification: Melinda [name] MRN:  [id number] Date of Evaluation:  01/17/2023 TX #: [id number]  MADRS:   MMSE:   P.E. Findings:  No change to physical exam appears stable  Psychiatric Interval Note:  Mood good and not depressed  Subjective:  Patient is a 52 y.o. female seen for evaluation for Electroconvulsive Therapy. Generally feeling good no complaints  Treatment Summary:   '[]'$   Right Unilateral             '[x]'$  Bilateral   % Energy : 1.0 ms 35%   Impedance: 1320 ohms  Seizure Energy Index: 6328 V squared  Postictal Suppression Index: No reading  Seizure Concordance Index: 96%  Medications  Pre Shock: Robinul 0.4 mg Toradol 30 mg labetalol 20 mg Brevital 70 mg succinylcholine 100 mg  Post Shock: None  Seizure Duration: 41 seconds EMG 117 seconds EEG   Comments: Well-tolerated.  Follow-up 4 weeks.  Melinda [name] passed on a concern from her primary care doctor about possibly starting gabapentin for some pain issues.  I think that would be perfectly fine and unlikely to affect her ECT treatment.  If that helps with her pain that would be great.  I have also passed this along to her primary care doctor.  Lungs:  '[x]'$   Clear to auscultation               '[]'$  Other:   Heart:    '[x]'$   Regular rhythm             '[]'$  irregular rhythm    '[x]'$   Previous H&P reviewed, patient examined and there are NO CHANGES                 '[]'$   Previous H&P reviewed, patient examined and there are changes noted.   [name], MD 3/8/20244:08 PM

## 2023-01-18 LAB — LIPID PANEL
Chol/HDL Ratio: 2.5 ratio (ref 0.0–4.4)
Cholesterol, Total: 132 mg/dL (ref 100–199)
HDL: 52 mg/dL (ref >39)
LDL Chol Calc (NIH): 54 mg/dL (ref 0–99)
Triglycerides: 157 mg/dL — ABNORMAL HIGH (ref 0–149)
VLDL Cholesterol Cal: 26 mg/dL (ref 5–40)

## 2023-01-18 LAB — TSH+FREE T4
Free T4: 1.13 ng/dL (ref 0.82–1.77)
TSH: 3 u[IU]/mL (ref 0.450–4.500)

## 2023-01-18 LAB — MICROALBUMIN / CREATININE URINE RATIO
Creatinine, Urine: 67.8 mg/dL
Microalb/Creat Ratio: 15 mg/g creat (ref 0–29)
Microalbumin, Urine: 9.9 ug/mL

## 2023-01-18 LAB — COMPREHENSIVE METABOLIC PANEL
ALT: 14 IU/L (ref 0–32)
AST: 15 IU/L (ref 0–40)
Albumin/Globulin Ratio: 1.8 (ref 1.2–2.2)
Albumin: 4.2 g/dL (ref 3.8–4.9)
Alkaline Phosphatase: 157 IU/L — ABNORMAL HIGH (ref 44–121)
BUN/Creatinine Ratio: 15 (ref 9–23)
BUN: 11 mg/dL (ref 6–24)
Bilirubin Total: <0.2 mg/dL (ref 0.0–1.2)
CO2: 22 mmol/L (ref 20–29)
Calcium: 9.4 mg/dL (ref 8.7–10.2)
Chloride: 109 mmol/L — ABNORMAL HIGH (ref 96–106)
Creatinine, Ser: 0.74 mg/dL (ref 0.57–1.00)
Globulin, Total: 2.4 g/dL (ref 1.5–4.5)
Glucose: 80 mg/dL (ref 70–99)
Potassium: 4.6 mmol/L (ref 3.5–5.2)
Sodium: 144 mmol/L (ref 134–144)
Total Protein: 6.6 g/dL (ref 6.0–8.5)
eGFR: 96 mL/min/{1.73_m2} (ref >59)

## 2023-01-18 LAB — HEMOGLOBIN A1C
Est. average glucose Bld gHb Est-mCnc: 137 mg/dL
Hgb A1c MFr Bld: 6.4 % — ABNORMAL HIGH (ref 4.8–5.6)

## 2023-01-20 ENCOUNTER — Other Ambulatory Visit: Payer: Self-pay | Admitting: Family Medicine

## 2023-01-20 MED ORDER — GABAPENTIN 300 MG PO CAPS: 300.0000 mg | ORAL_CAPSULE | Freq: Every day | ORAL | 3 refills | Status: DC

## 2023-01-24 ENCOUNTER — Ambulatory Visit (INDEPENDENT_AMBULATORY_CARE_PROVIDER_SITE_OTHER): Payer: Medicare HMO | Admitting: Family Medicine

## 2023-01-24 ENCOUNTER — Encounter: Payer: Self-pay | Admitting: Family Medicine

## 2023-01-24 VITALS — BP 103/71 | HR 88 | Temp 97.6°F | Resp 12 | Wt 216.0 lb

## 2023-01-24 DIAGNOSIS — A084 Viral intestinal infection, unspecified: Secondary | ICD-10-CM

## 2023-01-24 NOTE — Progress Notes (Signed)
I,Sulibeya S Dimas,acting as a scribe for Lavon Paganini, MD.,have documented all relevant documentation on the behalf of Lavon Paganini, MD,as directed by  Lavon Paganini, MD while in the presence of Lavon Paganini, MD.     Established patient visit   Patient: Melinda [name]   DOB: [birth year]   52 y.o. Female  MRN: [id number] Visit Date: 01/24/2023  Today's healthcare provider: Lavon Paganini, MD   Chief Complaint  Patient presents with   Emesis   Subjective    HPI  Patient reports she was vomiting and headache on Wednesday evening through Thursday morning. She denies any vomiting or abdominal pain today. She reports feeling weak today.  She reports her family members had stomach bug. She report she has been able to eat and drink since last night. Still feeling weak at this point. Minimal diarrhea.  Medications: Outpatient Medications Prior to Visit  Medication Sig   gabapentin (NEURONTIN) 300 MG capsule Take 1 capsule (300 mg total) by mouth at bedtime.   atorvastatin (LIPITOR) 10 MG tablet TAKE 1 TABLET EVERY DAY   Blood Glucose Monitoring Suppl (TRUE METRIX METER) w/Device KIT USE AS DIRECTED AS NEEDED  FOR  DIABETES  MELLITUS  TYPE 2   buPROPion (WELLBUTRIN XL) 300 MG 24 hr tablet Take 1 tablet (300 mg total) by mouth daily.   empagliflozin (JARDIANCE) 25 MG TABS tablet Take 25 mg by mouth daily.   escitalopram (LEXAPRO) 20 MG tablet Take 1 tablet (20 mg total) by mouth daily.   lisinopril (ZESTRIL) 5 MG tablet TAKE 1 TABLET EVERY DAY, MAY TAKE 2 TABLETS ON DAYS THAT YOU HAVE ECT   meloxicam (MOBIC) 7.5 MG tablet TAKE 1 TABLET EVERY DAY   metFORMIN (GLUCOPHAGE-XR) 500 MG 24 hr tablet TAKE 1 TABLET (500 MG TOTAL) BY MOUTH IN THE MORNING AND AT BEDTIME.   TRUE METRIX BLOOD GLUCOSE TEST test strip CHECK FASTING BLOOD SUGAR ONCE DAILY   TRUEplus Lancets 33G MISC CHECK BLOOD GLUCOSE  LEVEL ONE TIME DAILY   ziprasidone (GEODON) 80 MG capsule TAKE 1 CAPSULE TWICE  DAILY   No facility-administered medications prior to visit.    Review of Systems  Constitutional:  Positive for appetite change and fatigue. Negative for chills and fever.  HENT:  Positive for rhinorrhea.   Respiratory:  Negative for cough and shortness of breath.   Cardiovascular:  Negative for chest pain.  Gastrointestinal:  Positive for diarrhea. Negative for abdominal pain, blood in stool, constipation, nausea and vomiting.  Genitourinary:  Negative for dysuria.  Neurological:  Positive for weakness and headaches.       Objective    BP 103/71 (BP Location: Left Arm, Patient Position: Sitting, Cuff Size: Large)   Pulse 88   Temp 97.6 F (36.4 C) (Temporal)   Resp 12   Wt 216 lb (98 kg)   LMP  (LMP Unknown) Comment: pt states she has not had period in the past year  SpO2 96%   BMI 40.81 kg/m    Physical Exam Vitals reviewed.  Constitutional:      General: She is not in acute distress.    Appearance: Normal appearance. She is well-developed. She is not diaphoretic.  HENT:     Head: Normocephalic and atraumatic.  Eyes:     General: No scleral icterus.    Conjunctiva/sclera: Conjunctivae normal.  Neck:     Thyroid: No thyromegaly.  Cardiovascular:     Rate and Rhythm: Normal rate and regular rhythm.  Heart sounds: Normal heart sounds. No murmur heard. Pulmonary:     Effort: Pulmonary effort is normal. No respiratory distress.     Breath sounds: Normal breath sounds. No wheezing, rhonchi or rales.  Abdominal:     General: There is no distension.     Palpations: Abdomen is soft.     Tenderness: There is no abdominal tenderness.  Musculoskeletal:     Cervical back: Neck supple.     Right lower leg: No edema.     Left lower leg: No edema.  Lymphadenopathy:     Cervical: No cervical adenopathy.  Skin:    General: Skin is warm and dry.     Findings: No rash.  Neurological:     Mental Status: She is alert and oriented to person, place, and time. Mental status  is at baseline.  Psychiatric:        Mood and Affect: Mood normal.        Behavior: Behavior normal.      No results found for any visits on 01/24/23.  Assessment & Plan     1. Viral gastroenteritis - mostly resolved - benign abd exam - suspect viral GE given sick contacts and brief time period - return precautions discussed   Return if symptoms worsen or fail to improve.      I, Lavon Paganini, MD, have reviewed all documentation for this visit. The documentation on 01/24/23 for the exam, diagnosis, procedures, and orders are all accurate and complete.   Jaliana Medellin, Dionne Bucy, MD, MPH [employer] [facility]

## 2023-02-07 ENCOUNTER — Ambulatory Visit: Payer: Self-pay | Admitting: Anesthesiology

## 2023-02-07 ENCOUNTER — Encounter: Payer: Self-pay | Admitting: Anesthesiology

## 2023-02-07 ENCOUNTER — Encounter
Admission: RE | Admit: 2023-02-07 | Discharge: 2023-02-07 | Disposition: A | Discharge status: Home / Self Care | Payer: Medicare HMO | Source: Ambulatory Visit | Attending: Family Medicine | Admitting: Family Medicine

## 2023-02-07 ENCOUNTER — Other Ambulatory Visit: Payer: Self-pay | Admitting: Psychiatry

## 2023-02-07 DIAGNOSIS — E7800 Pure hypercholesterolemia, unspecified: Secondary | ICD-10-CM | POA: Insufficient documentation

## 2023-02-07 DIAGNOSIS — G4730 Sleep apnea, unspecified: Secondary | ICD-10-CM | POA: Diagnosis not present

## 2023-02-07 DIAGNOSIS — R569 Unspecified convulsions: Secondary | ICD-10-CM | POA: Insufficient documentation

## 2023-02-07 DIAGNOSIS — K219 Gastro-esophageal reflux disease without esophagitis: Secondary | ICD-10-CM | POA: Diagnosis not present

## 2023-02-07 DIAGNOSIS — F418 Other specified anxiety disorders: Secondary | ICD-10-CM | POA: Diagnosis not present

## 2023-02-07 DIAGNOSIS — I10 Essential (primary) hypertension: Secondary | ICD-10-CM | POA: Insufficient documentation

## 2023-02-07 DIAGNOSIS — F332 Major depressive disorder, recurrent severe without psychotic features: Secondary | ICD-10-CM | POA: Insufficient documentation

## 2023-02-07 DIAGNOSIS — Z6841 Body Mass Index (BMI) 40.0 and over, adult: Secondary | ICD-10-CM | POA: Diagnosis not present

## 2023-02-07 DIAGNOSIS — E669 Obesity, unspecified: Secondary | ICD-10-CM | POA: Diagnosis not present

## 2023-02-07 DIAGNOSIS — E119 Type 2 diabetes mellitus without complications: Secondary | ICD-10-CM | POA: Insufficient documentation

## 2023-02-07 LAB — GLUCOSE, CAPILLARY
Glucose-Capillary: 96 mg/dL (ref 70–99)
Glucose-Capillary: 134 mg/dL — ABNORMAL HIGH (ref 70–99)

## 2023-02-07 MED ORDER — KETAMINE HCL 10 MG/ML IJ SOLN
INTRAMUSCULAR | Status: DC | PRN
  Administered 2023-02-07: 40 mg via INTRAVENOUS

## 2023-02-07 MED ORDER — LABETALOL HCL 5 MG/ML IV SOLN
INTRAVENOUS | Status: DC | PRN
  Administered 2023-02-07: 10 mg via INTRAVENOUS

## 2023-02-07 MED ORDER — OXYCODONE HCL 5 MG/5ML PO SOLN: 5.0000 mg | Freq: Once | ORAL | Status: DC | PRN

## 2023-02-07 MED ORDER — GLYCOPYRROLATE 0.2 MG/ML IJ SOLN
INTRAMUSCULAR | Status: AC
  Filled 2023-02-07: qty 2

## 2023-02-07 MED ORDER — KETOROLAC TROMETHAMINE 30 MG/ML IJ SOLN
30.0000 mg | Freq: Once | INTRAMUSCULAR | Status: AC
  Administered 2023-02-07: 30 mg via INTRAVENOUS

## 2023-02-07 MED ORDER — SUCCINYLCHOLINE CHLORIDE 200 MG/10ML IV SOSY
PREFILLED_SYRINGE | INTRAVENOUS | Status: DC | PRN
  Administered 2023-02-07: 100 mg via INTRAVENOUS

## 2023-02-07 MED ORDER — LABETALOL HCL 5 MG/ML IV SOLN
INTRAVENOUS | Status: AC
  Filled 2023-02-07: qty 4

## 2023-02-07 MED ORDER — PROPOFOL 10 MG/ML IV BOLUS
INTRAVENOUS | Status: AC
  Filled 2023-02-07: qty 20

## 2023-02-07 MED ORDER — SODIUM CHLORIDE 0.9 % IV SOLN: 500.0000 mL | Freq: Once | INTRAVENOUS | Status: AC

## 2023-02-07 MED ORDER — OXYCODONE HCL 5 MG PO TABS: 5.0000 mg | ORAL_TABLET | Freq: Once | ORAL | Status: DC | PRN

## 2023-02-07 MED ORDER — KETAMINE HCL 50 MG/5ML IJ SOSY
PREFILLED_SYRINGE | INTRAMUSCULAR | Status: AC
  Filled 2023-02-07: qty 5

## 2023-02-07 MED ORDER — GLYCOPYRROLATE 0.2 MG/ML IJ SOLN
0.4000 mg | Freq: Once | INTRAMUSCULAR | Status: AC
  Administered 2023-02-07: 0.4 mg via INTRAVENOUS

## 2023-02-07 MED ORDER — ESMOLOL HCL 100 MG/10ML IV SOLN
INTRAVENOUS | Status: DC | PRN
  Administered 2023-02-07: 20 mg via INTRAVENOUS

## 2023-02-07 MED ORDER — ESMOLOL HCL 100 MG/10ML IV SOLN
INTRAVENOUS | Status: AC
  Filled 2023-02-07: qty 10

## 2023-02-07 MED ORDER — FENTANYL CITRATE (PF) 100 MCG/2ML IJ SOLN: 25.0000 ug | INTRAMUSCULAR | Status: DC | PRN

## 2023-02-07 MED ORDER — PROPOFOL 10 MG/ML IV BOLUS
INTRAVENOUS | Status: DC | PRN
  Administered 2023-02-07: 40 mg via INTRAVENOUS

## 2023-02-07 NOTE — Procedures (Signed)
ECT SERVICES Physician's Interval Evaluation & Treatment Note  Patient Identification: KOY SZOPINSKI MRN:  [id number] Date of Evaluation:  02/07/2023 TX #: [id number]  MADRS:   MMSE:   P.E. Findings:  No change to physical exam  Psychiatric Interval Note:  No complaint mood overall doing well.  Subjective:  Patient is a 52 y.o. female seen for evaluation for Electroconvulsive Therapy. No new complaint  Treatment Summary:   []   Right Unilateral             [x]  Bilateral   % Energy : 1.0 ms 35%   Impedance: 1380 ohms  Seizure Energy Index: 2533 V squared  Postictal Suppression Index: 76%  Seizure Concordance Index: 91%  Medications  Pre Shock: Robinul 0.4 mg Toradol 30 mg labetalol 20 mg Brevital 70 mg succinylcholine 100 mg  Post Shock:    Seizure Duration: 23 seconds EMG 68 seconds EEG   Comments: Well-tolerated.  Doing well.  Follow up in 4 weeks  Lungs:  [x]   Clear to auscultation               []  Other:   Heart:    [x]   Regular rhythm             []  irregular rhythm    [x]   Previous H&P reviewed, patient examined and there are NO CHANGES                 []   Previous H&P reviewed, patient examined and there are changes noted.   [name], MD 3/29/20245:52 PM

## 2023-02-07 NOTE — Transfer of Care (Signed)
Immediate Anesthesia Transfer of Care Note  Patient: Melinda [name]  Procedure(s) Performed: ECT TX  Patient Location: PACU  Anesthesia Type:General  Level of Consciousness: drowsy and patient cooperative  Airway & Oxygen Therapy: Patient Spontanous Breathing  Post-op Assessment: Report given to RN and Post -op Vital signs reviewed and stable  Post vital signs: Reviewed and stable  Last Vitals:  Vitals Value Taken Time  BP 158/98 02/07/23 1326  Temp    Pulse 105 02/07/23 1326  Resp 17 02/07/23 1326  SpO2 91 % 02/07/23 1326  Vitals shown include unvalidated device data.  Last Pain:  Vitals:   02/07/23 1142  TempSrc:   PainSc: 0-No pain         Complications: No notable events documented.

## 2023-02-07 NOTE — H&P (Signed)
Melinda [name] is an 52 y.o. female.   Chief Complaint: none. Doing well HPI: recurrent depression  Past Medical History:  Diagnosis Date   Depression    Diabetes mellitus without complication ([place])    Diabetic peripheral neuropathy ([place]) 03/07/15   Diabetic peripheral neuropathy ([place]) 03/07/15   Diabetic peripheral neuropathy ([place]) 03/07/15   GERD (gastroesophageal reflux disease)    Hypercholesterolemia 03/07/15   Hypertension    Obesity 03/07/15   Personality disorder ([place]) 03/07/15   Sinus tachycardia 03/07/15   history of   Suicidal thoughts 03/07/15    Past Surgical History:  Procedure Laterality Date   COLONOSCOPY WITH PROPOFOL N/A 12/01/2018   Procedure: COLONOSCOPY WITH PROPOFOL;  Surgeon: Jonathon Bellows, MD;  Location[facility] ENDOSCOPY;  Service: Gastroenterology;  Laterality: N/A;   electroconvulsion therapy  03/07/15    Family History  Problem Relation Age of Onset   Diabetes Mother    Hypertension Father    Breast cancer Neg Hx    Colon cancer Neg Hx    Social History:  reports that she has never smoked. She has never used smokeless tobacco. She reports that she does not drink alcohol and does not use drugs.  Allergies:  Allergies  Allergen Reactions   Prednisone     Increases blood sugar    (Not in a hospital admission)   Results for orders placed or performed during the hospital encounter of 02/07/23 (from the past 48 hour(s))  Glucose, capillary     Status: None   Collection Time: 02/07/23 11:29 AM  Result Value Ref Range   Glucose-Capillary 96 70 - 99 mg/dL    Comment: Glucose reference range applies only to samples taken after fasting for at least 8 hours.   *Note: Due to a large number of results and/or encounters for the requested time period, some results have not been displayed. A complete set of results can be found in Results Review.   No results found.  Review of Systems  Constitutional: Negative.   HENT: Negative.    Eyes: Negative.    Respiratory: Negative.    Cardiovascular: Negative.   Gastrointestinal: Negative.   Musculoskeletal: Negative.   Skin: Negative.   Neurological: Negative.   Psychiatric/Behavioral: Negative.    All other systems reviewed and are negative.   Blood pressure 136/85, pulse 88, temperature (!) 97 F (36.1 C), temperature source Oral, resp. rate 15, SpO2 98 %. Physical Exam Vitals and nursing note reviewed.  Constitutional:      Appearance: She is well-developed.  HENT:     Head: Normocephalic and atraumatic.  Eyes:     Conjunctiva/sclera: Conjunctivae normal.     Pupils: Pupils are equal, round, and reactive to light.  Cardiovascular:     Heart sounds: Normal heart sounds.  Pulmonary:     Effort: Pulmonary effort is normal.  Abdominal:     Palpations: Abdomen is soft.  Musculoskeletal:        General: Normal range of motion.     Cervical back: Normal range of motion.  Skin:    General: Skin is warm and dry.  Neurological:     General: No focal deficit present.     Mental Status: She is alert.  Psychiatric:        Mood and Affect: Mood normal.        Thought Content: Thought content normal.      Assessment/Plan Next treatment in a month  [name], MD 02/07/2023, 12:07 PM

## 2023-02-07 NOTE — Anesthesia Preprocedure Evaluation (Signed)
Anesthesia Evaluation  Patient identified by MRN, date of birth, ID band Patient awake    Reviewed: Allergy & Precautions, NPO status , Patient's Chart, lab work & pertinent test results  History of Anesthesia Complications Negative for: history of anesthetic complications  Airway Mallampati: III  TM Distance: >3 FB Neck ROM: Full    Dental  (+) Missing, Chipped   Pulmonary sleep apnea     + decreased breath sounds      Cardiovascular hypertension, Pt. on medications Normal cardiovascular exam Rhythm:Regular Rate:Normal - Systolic murmurs    Neuro/Psych  PSYCHIATRIC DISORDERS Anxiety Depression Bipolar Disorder    Neuromuscular disease    GI/Hepatic Neg liver ROS,GERD  Medicated,,  Endo/Other  diabetesHypothyroidism  Morbid obesity  Renal/GU negative Renal ROS  negative genitourinary   Musculoskeletal negative musculoskeletal ROS (+)    Abdominal  (+) + obese  Peds negative pediatric ROS (+)  Hematology negative hematology ROS (+)   Anesthesia Other Findings Past Medical History: No date: Depression No date: Diabetes mellitus without complication ([place]) [id number]: Diabetic peripheral neuropathy ([place]) 03/07/15: Diabetic peripheral neuropathy ([place]) 03/07/15: Diabetic peripheral neuropathy ([facility]) No date: GERD (gastroesophageal reflux disease) 03/07/15: Hypercholesterolemia No date: Hypertension 03/07/15: Obesity 03/07/15: Personality disorder ([place]) 03/07/15: Sinus tachycardia     Comment:  history of 03/07/15: Suicidal thoughts  Past Surgical History: 12/01/2018: COLONOSCOPY WITH PROPOFOL; N/A     Comment:  Procedure: COLONOSCOPY WITH PROPOFOL;  Surgeon: Jonathon Bellows, MD;  Location: [facility] ENDOSCOPY;  Service:               Gastroenterology;  Laterality: N/A; 03/07/15: electroconvulsion therapy  BMI    Body Mass Index: 38.44 kg/m      Reproductive/Obstetrics negative OB ROS                               Anesthesia Physical Anesthesia Plan  ASA: 3  Anesthesia Plan: General   Post-op Pain Management: Minimal or no pain anticipated   Induction: Intravenous  PONV Risk Score and Plan: 3 and Propofol infusion and TIVA  Airway Management Planned: Mask  Additional Equipment: None  Intra-op Plan:   Post-operative Plan:   Informed Consent: I have reviewed the patients History and Physical, chart, labs and discussed the procedure including the risks, benefits and alternatives for the proposed anesthesia with the patient or authorized representative who has indicated his/her understanding and acceptance.     Dental Advisory Given  Plan Discussed with: CRNA and Surgeon  Anesthesia Plan Comments: (Discussed risks of anesthesia with patient, including PONV, muscle aches. Rare risks discussed as well, such as cardiorespiratory sequelae, need for airway intervention and its associated risks including lip/dental/eye damage and sore throat, and allergic reactions. Discussed the role of CRNA in patient's perioperative care. Patient understands. Patient informed about increased incidence of above perioperative risk due to high BMI. Patient understands.  )        Anesthesia Quick Evaluation

## 2023-02-07 NOTE — Anesthesia Postprocedure Evaluation (Signed)
Anesthesia Post Note  Patient: Devann M Wiegand  Procedure(s) Performed: ECT TX  Patient location during evaluation: Endoscopy Anesthesia Type: General Level of consciousness: awake and alert Pain management: pain level controlled Vital Signs Assessment: post-procedure vital signs reviewed and stable Respiratory status: spontaneous breathing, nonlabored ventilation, respiratory function stable and patient connected to nasal cannula oxygen Cardiovascular status: blood pressure returned to baseline and stable Postop Assessment: no apparent nausea or vomiting Anesthetic complications: no   No notable events documented.   Last Vitals:  Vitals:   02/07/23 1330 02/07/23 1345  BP: (!) 143/96 129/86  Pulse: (!) 105 (!) 102  Resp: 20 20  Temp: 37.2 C 36.8 C  SpO2: 97% 94%    Last Pain:  Vitals:   02/07/23 1323  TempSrc:   PainSc: Asleep                 [name]

## 2023-02-12 ENCOUNTER — Inpatient Hospital Stay: Admission: RE | Admit: 2023-02-12 | Payer: Medicare HMO | Source: Ambulatory Visit

## 2023-02-12 ENCOUNTER — Telehealth: Payer: Self-pay | Admitting: Family Medicine

## 2023-02-12 NOTE — Telephone Encounter (Signed)
PT is calling in because she was told by PCP she had to get two vaccines, one for shingles and she is unsure of the other one. Pt would like someone to call her back and inform her on what the other vaccine is. Please advise.

## 2023-02-13 NOTE — Telephone Encounter (Signed)
Health Maintenance shingles vaccine and Tdap. Patient advised.

## 2023-02-18 ENCOUNTER — Ambulatory Visit: Payer: Self-pay | Admitting: *Deleted

## 2023-02-18 ENCOUNTER — Other Ambulatory Visit: Payer: Self-pay | Admitting: Family Medicine

## 2023-02-18 DIAGNOSIS — E1165 Type 2 diabetes mellitus with hyperglycemia: Secondary | ICD-10-CM

## 2023-02-18 NOTE — Telephone Encounter (Signed)
Summary: nose bleed   Pt stated when she blew her nose her nose started to bleed. Pt declined an appt but would like a return call to discuss. Cb# [phone]     Reason for Disposition  [1] Mild-moderate nosebleed AND [2] bleeding stopped now  Answer Assessment - Initial Assessment Questions 1. AMOUNT OF BLEEDING: "How bad is the bleeding?" "How much blood was lost?" "Has the bleeding stopped?"   - MILD: needed a couple tissues   - MODERATE: needed many tissues   - SEVERE: large blood clots, soaked many tissues, lasted more than 30 minutes      More with mucus- some active bleeding- mild 2. ONSET: "When did the nosebleed start?"      This morning 3. FREQUENCY: "How many nosebleeds have you had in the last 24 hours?"      Not normally- once 4. RECURRENT SYMPTOMS: "Have there been other recent nosebleeds?" If Yes, ask: "How long did it take you to stop the bleeding?" "What worked best?"      no 5. CAUSE: "What do you think caused this nosebleed?"     Mucus- congestion/allergies 6. LOCAL FACTORS: "Do you have any cold symptoms?", "Have you been rubbing or picking at your nose?"     Yes- mucus 7. SYSTEMIC FACTORS: "Do you have high blood pressure or any bleeding problems?"     No- BP regulated 8. BLOOD THINNERS: "Do you take any blood thinners?" (e.g., aspirin, clopidogrel / Plavix, coumadin, heparin). Notes: Other strong blood thinners include: Arixtra (fondaparinux), Eliquis (apixaban), Pradaxa (dabigatran), and Xarelto (rivaroxaban).     no 9. OTHER SYMPTOMS: "Do you have any other symptoms?" (e.g., lightheadedness)     Some dizziness- not often  Protocols used: Nosebleed-A-AH

## 2023-02-18 NOTE — Telephone Encounter (Signed)
  Chief Complaint: nosebleed Symptoms: blood that filled tissue with blowing of nose Frequency: once- today Pertinent Negatives: Patient denies continued bleeding Disposition: [] ED /[] Urgent Care (no appt availability in office) / [] Appointment(In office/virtual)/ []  [place] [facility]/ [x] Home Care/ [] Refused Recommended Disposition /[] [place] [facility]/ []  Follow-up with PCP Additional Notes: Patient was given instructions for nosebleed if happens again- 6 month follow up appointment scheduled.

## 2023-02-21 ENCOUNTER — Other Ambulatory Visit: Payer: Self-pay | Admitting: Family Medicine

## 2023-02-21 DIAGNOSIS — M25512 Pain in left shoulder: Secondary | ICD-10-CM

## 2023-02-24 NOTE — Telephone Encounter (Signed)
Requested Prescriptions  Pending Prescriptions Disp Refills   meloxicam (MOBIC) 7.5 MG tablet [Pharmacy Med Name: MELOXICAM 7.5 MG Tablet] 90 tablet 3    Sig: TAKE 1 TABLET EVERY DAY     Analgesics:  COX2 Inhibitors Failed - 02/21/2023  1:53 PM      Failed - Manual Review: Labs are only required if the patient has taken medication for more than 8 weeks.      Passed - HGB in normal range and within 360 days    Hemoglobin  Date Value Ref Range Status  06/24/2022 12.0 11.1 - 15.9 g/dL Final         Passed - Cr in normal range and within 360 days    Creatinine  Date Value Ref Range Status  05/08/2013 0.77 0.60 - 1.30 mg/dL Final   Creatinine, Ser  Date Value Ref Range Status  01/16/2023 0.74 0.57 - 1.00 mg/dL Final         Passed - HCT in normal range and within 360 days    Hematocrit  Date Value Ref Range Status  06/24/2022 37.0 34.0 - 46.6 % Final         Passed - AST in normal range and within 360 days    AST  Date Value Ref Range Status  01/16/2023 15 0 - 40 IU/L Final   SGOT(AST)  Date Value Ref Range Status  05/08/2013 14 (L) 15 - 37 Unit/L Final         Passed - ALT in normal range and within 360 days    ALT  Date Value Ref Range Status  01/16/2023 14 0 - 32 IU/L Final   SGPT (ALT)  Date Value Ref Range Status  05/08/2013 14 12 - 78 U/L Final         Passed - eGFR is 30 or above and within 360 days    EGFR (African American)  Date Value Ref Range Status  05/08/2013 >60  Final   GFR calc Af Amer  Date Value Ref Range Status  05/16/2020 90 >59 mL/min/1.73 Final    Comment:    **[facility] currently reports eGFR in compliance with the current**   recommendations of the [employer]. [facility] will   update reporting as new guidelines are published from the NKF-ASN   Task force.    EGFR (Non-African Amer.)  Date Value Ref Range Status  05/08/2013 >60  Final    Comment:    eGFR values <39mL/min/1.73 m2 may be an indication of chronic kidney  disease (CKD). Calculated eGFR is useful in patients with stable renal function. The eGFR calculation will not be reliable in acutely ill patients when serum creatinine is changing rapidly. It is not useful in  patients on dialysis. The eGFR calculation may not be applicable to patients at the low and high extremes of body sizes, pregnant women, and vegetarians.    GFR calc non Af Amer  Date Value Ref Range Status  05/16/2020 78 >59 mL/min/1.73 Final   eGFR  Date Value Ref Range Status  01/16/2023 96 >59 mL/min/1.73 Final         Passed - Patient is not pregnant      Passed - Valid encounter within last 12 months    Recent Outpatient Visits           1 month ago Viral gastroenteritis   [facility] [place], Marzella Schlein, MD   1 month ago Encounter for annual physical exam  [facility] [place], Marzella Schlein, MD   4 months ago Hypertension associated with diabetes [facility])   [facility] [place], Marzella Schlein, MD   7 months ago Nasal sore   [facility] [place] Dense M, DO   8 months ago Hypertension associated with diabetes [facility])   [facility] [place] Caro [place], DO       Future Appointments             In 4 months Bac[place]al[place], Marzella Schlein, MD [facility], [facility]

## 2023-02-26 ENCOUNTER — Ambulatory Visit: Payer: Self-pay

## 2023-02-26 ENCOUNTER — Ambulatory Visit
Admission: RE | Admit: 2023-02-26 | Discharge: 2023-02-26 | Disposition: A | Discharge status: Home / Self Care | Payer: Medicare HMO | Source: Ambulatory Visit | Attending: Family Medicine | Admitting: Family Medicine

## 2023-02-26 DIAGNOSIS — Z1231 Encounter for screening mammogram for malignant neoplasm of breast: Secondary | ICD-10-CM | POA: Insufficient documentation

## 2023-02-26 NOTE — Telephone Encounter (Signed)
  Chief Complaint: cough Symptoms: cough with congestion, nasal congestion, sneezing and runny nose  Frequency: 3-4 weeks  Pertinent Negatives: Patient denies SOB or fever Disposition: ED /[] Urgent Care (no appt availability in office) / Appointment(In office/virtual)/  [place] [facility]/ Home Care/ Refused Recommended Disposition /[] [place] [facility]/  Follow-up with PCP Additional Notes: pt states she has cough and thought possibly allergies but doesn't go outside a lot. Pt hasn't been taking anything OTC for sx. Her son bought her some generic Fexofenadine and Allegra tablets to try. Advised pt that both of the same medication so can take just 1 box at a time. Offered to schedule OV to make sure no infection for URI but pt declined. She wants to try OTC first and will CB if needs appt. Also will try Dayquil/Nyquil.   Summary: productive cough, sneezing   Patient states that she has been sneezing and had a productive cough for 2 weeks. Patient wants to know if this is allergies and if she can take otc medications. Please advise.     Reason for Disposition  Cough has been present for > 3 weeks  Answer Assessment - Initial Assessment Questions 1. ONSET: "When did the cough begin?"      3-4 weeks  3. SPUTUM: "Describe the color of your sputum" (none, dry cough; clear, white, yellow, green)     Yes  5. DIFFICULTY BREATHING: "Are you having difficulty breathing?" If Yes, ask: "How bad is it?" (e.g., mild, moderate, severe)    - MILD: No SOB at rest, mild SOB with walking, speaks normally in sentences, can lie down, no retractions, pulse < 100.    - MODERATE: SOB at rest, SOB with minimal exertion and prefers to sit, cannot lie down flat, speaks in phrases, mild retractions, audible wheezing, pulse 100-120.    - SEVERE: Very SOB at rest, speaks in single words, struggling to breathe, sitting hunched forward, retractions, pulse > 120      no 6. FEVER: "Do you have a  fever?" If Yes, ask: "What is your temperature, how was it measured, and when did it start?"     no 8. LUNG HISTORY: "Do you have any history of lung disease?"  (e.g., pulmonary embolus, asthma, emphysema)     No 10. OTHER SYMPTOMS: "Do you have any other symptoms?" (e.g., runny nose, wheezing, chest pain)       Sneezing and nasal congestion  Protocols used: Cough - Acute Productive-A-AH

## 2023-02-27 NOTE — Telephone Encounter (Signed)
Noted  

## 2023-03-07 ENCOUNTER — Encounter
Admission: RE | Admit: 2023-03-07 | Discharge: 2023-03-07 | Disposition: A | Discharge status: Home / Self Care | Payer: Medicare HMO | Source: Ambulatory Visit | Attending: Family Medicine | Admitting: Family Medicine

## 2023-03-07 ENCOUNTER — Encounter: Payer: Self-pay | Admitting: Anesthesiology

## 2023-03-07 ENCOUNTER — Other Ambulatory Visit: Payer: Self-pay | Admitting: Psychiatry

## 2023-03-07 DIAGNOSIS — E7800 Pure hypercholesterolemia, unspecified: Secondary | ICD-10-CM | POA: Diagnosis not present

## 2023-03-07 DIAGNOSIS — I10 Essential (primary) hypertension: Secondary | ICD-10-CM | POA: Insufficient documentation

## 2023-03-07 DIAGNOSIS — G4730 Sleep apnea, unspecified: Secondary | ICD-10-CM | POA: Insufficient documentation

## 2023-03-07 DIAGNOSIS — R569 Unspecified convulsions: Secondary | ICD-10-CM | POA: Diagnosis not present

## 2023-03-07 DIAGNOSIS — F418 Other specified anxiety disorders: Secondary | ICD-10-CM | POA: Diagnosis not present

## 2023-03-07 DIAGNOSIS — F332 Major depressive disorder, recurrent severe without psychotic features: Secondary | ICD-10-CM

## 2023-03-07 DIAGNOSIS — E669 Obesity, unspecified: Secondary | ICD-10-CM | POA: Insufficient documentation

## 2023-03-07 DIAGNOSIS — K219 Gastro-esophageal reflux disease without esophagitis: Secondary | ICD-10-CM | POA: Diagnosis not present

## 2023-03-07 DIAGNOSIS — E119 Type 2 diabetes mellitus without complications: Secondary | ICD-10-CM | POA: Diagnosis not present

## 2023-03-07 DIAGNOSIS — Z6841 Body Mass Index (BMI) 40.0 and over, adult: Secondary | ICD-10-CM | POA: Diagnosis not present

## 2023-03-07 DIAGNOSIS — F319 Bipolar disorder, unspecified: Secondary | ICD-10-CM | POA: Diagnosis not present

## 2023-03-07 LAB — GLUCOSE, CAPILLARY
Glucose-Capillary: 103 mg/dL — ABNORMAL HIGH (ref 70–99)
Glucose-Capillary: 113 mg/dL — ABNORMAL HIGH (ref 70–99)

## 2023-03-07 MED ORDER — KETOROLAC TROMETHAMINE 30 MG/ML IJ SOLN
INTRAMUSCULAR | Status: AC
  Filled 2023-03-07: qty 1

## 2023-03-07 MED ORDER — SUCCINYLCHOLINE CHLORIDE 200 MG/10ML IV SOSY
PREFILLED_SYRINGE | INTRAVENOUS | Status: DC | PRN
  Administered 2023-03-07: 100 mg via INTRAVENOUS

## 2023-03-07 MED ORDER — PROPOFOL 10 MG/ML IV BOLUS
INTRAVENOUS | Status: DC | PRN
  Administered 2023-03-07: 40 mg via INTRAVENOUS

## 2023-03-07 MED ORDER — KETOROLAC TROMETHAMINE 30 MG/ML IJ SOLN
30.0000 mg | Freq: Once | INTRAMUSCULAR | Status: AC
  Administered 2023-03-07: 30 mg via INTRAVENOUS

## 2023-03-07 MED ORDER — SODIUM CHLORIDE 0.9 % IV SOLN: 500.0000 mL | Freq: Once | INTRAVENOUS | Status: AC

## 2023-03-07 MED ORDER — KETAMINE HCL 50 MG/5ML IJ SOSY
PREFILLED_SYRINGE | INTRAMUSCULAR | Status: AC
  Filled 2023-03-07: qty 5

## 2023-03-07 MED ORDER — KETAMINE HCL 10 MG/ML IJ SOLN
INTRAMUSCULAR | Status: DC | PRN
  Administered 2023-03-07: 40 mg via INTRAVENOUS

## 2023-03-07 MED ORDER — GLYCOPYRROLATE 0.2 MG/ML IJ SOLN
INTRAMUSCULAR | Status: AC
  Filled 2023-03-07: qty 2

## 2023-03-07 MED ORDER — GLYCOPYRROLATE 0.2 MG/ML IJ SOLN
0.4000 mg | Freq: Once | INTRAMUSCULAR | Status: AC
  Administered 2023-03-07: 0.4 mg via INTRAVENOUS

## 2023-03-07 MED ORDER — LABETALOL HCL 5 MG/ML IV SOLN
INTRAVENOUS | Status: DC | PRN
  Administered 2023-03-07: 10 mg via INTRAVENOUS

## 2023-03-07 MED ORDER — ESMOLOL HCL 100 MG/10ML IV SOLN
INTRAVENOUS | Status: DC | PRN
  Administered 2023-03-07: 30 mg via INTRAVENOUS

## 2023-03-07 NOTE — Procedures (Signed)
ECT SERVICES Physician's Interval Evaluation & Treatment Note  Patient Identification: GIZEL RIEDLINGER MRN:  [id number] Date of Evaluation:  03/07/2023 TX #: [id number]  MADRS:   MMSE:   P.E. Findings:  No change to physical exam  Psychiatric Interval Note:  Mood stable no depression  Subjective:  Patient is a 52 y.o. female seen for evaluation for Electroconvulsive Therapy. No complaint doing well  Treatment Summary:   []   Right Unilateral             [x]  Bilateral   % Energy : 1.0 ms 35%   Impedance: 1406 homes  Seizure Energy Index: 6887 V squared  Postictal Suppression Index: No reading  Seizure Concordance Index: 98%  Medications  Pre Shock: Robinul 0.4 mg Toradol 30 mg Brevital 70 mg labetalol 20 mg succinylcholine 100 mg  Post Shock:    Seizure Duration: 29 seconds EMG 68 seconds EEG   Comments: Follow-up 3 weeks  Lungs:  [x]   Clear to auscultation               []  Other:   Heart:    [x]   Regular rhythm             []  irregular rhythm    [x]   Previous H&P reviewed, patient examined and there are NO CHANGES                 []   Previous H&P reviewed, patient examined and there are changes noted.   Mordecai Rasmussen, MD 4/26/20245:04 PM

## 2023-03-07 NOTE — Transfer of Care (Signed)
Immediate Anesthesia Transfer of Care Note  Patient: Melinda [name]  Procedure(s) Performed: ECT TX  Patient Location: PACU  Anesthesia Type:General  Level of Consciousness: sedated  Airway & Oxygen Therapy: Patient Spontanous Breathing and Patient connected to face mask oxygen  Post-op Assessment: Report given to RN and Post -op Vital signs reviewed and stable  Post vital signs: Reviewed  Last Vitals:  Vitals Value Taken Time  BP    Temp    Pulse 93 03/07/23 1330  Resp 16 03/07/23 1330  SpO2 97 % 03/07/23 1330  Vitals shown include unvalidated device data.  Last Pain:  Vitals:   03/07/23 1129  PainSc: 0-No pain         Complications: No notable events documented.

## 2023-03-07 NOTE — Anesthesia Postprocedure Evaluation (Signed)
Anesthesia Post Note  Patient: Melinda [name]  Procedure(s) Performed: ECT TX  Patient location during evaluation: PACU Anesthesia Type: General Level of consciousness: awake and alert Pain management: pain level controlled Vital Signs Assessment: post-procedure vital signs reviewed and stable Respiratory status: spontaneous breathing, nonlabored ventilation, respiratory function stable and patient connected to nasal cannula oxygen Cardiovascular status: blood pressure returned to baseline and stable Postop Assessment: no apparent nausea or vomiting Anesthetic complications: no   No notable events documented.   Last Vitals:  Vitals:   03/07/23 1331  BP: (!) 148/97  Pulse: (!) 103  Resp: (!) 21  Temp: 37.3 C  SpO2: 99%    Last Pain:  Vitals:   03/07/23 1331  PainSc: 0-No pain                 Corinda Gubler

## 2023-03-07 NOTE — Anesthesia Preprocedure Evaluation (Signed)
Anesthesia Evaluation  Patient identified by MRN, date of birth, ID band Patient awake    Reviewed: Allergy & Precautions, NPO status , Patient's Chart, lab work & pertinent test results  History of Anesthesia Complications Negative for: history of anesthetic complications  Airway Mallampati: III  TM Distance: >3 FB Neck ROM: Full    Dental  (+) Missing, Chipped   Pulmonary sleep apnea     + decreased breath sounds      Cardiovascular hypertension, Pt. on medications Normal cardiovascular exam Rhythm:Regular Rate:Normal - Systolic murmurs    Neuro/Psych  PSYCHIATRIC DISORDERS Anxiety Depression Bipolar Disorder    Neuromuscular disease    GI/Hepatic Neg liver ROS,GERD  Medicated,,  Endo/Other  diabetesHypothyroidism  Morbid obesity  Renal/GU negative Renal ROS  negative genitourinary   Musculoskeletal negative musculoskeletal ROS (+)    Abdominal  (+) + obese  Peds negative pediatric ROS (+)  Hematology negative hematology ROS (+)   Anesthesia Other Findings Past Medical History: No date: Depression No date: Diabetes mellitus without complication (HCC) 03/07/15: Diabetic peripheral neuropathy (HCC) 03/07/15: Diabetic peripheral neuropathy (HCC) 03/07/15: Diabetic peripheral neuropathy (HCC) No date: GERD (gastroesophageal reflux disease) 03/07/15: Hypercholesterolemia No date: Hypertension 03/07/15: Obesity 03/07/15: Personality disorder (HCC) 03/07/15: Sinus tachycardia     Comment:  history of 03/07/15: Suicidal thoughts  Past Surgical History: 12/01/2018: COLONOSCOPY WITH PROPOFOL; N/A     Comment:  Procedure: COLONOSCOPY WITH PROPOFOL;  Surgeon: Anna,               Kiran, MD;  Location: [facility] ENDOSCOPY;  Service:               Gastroenterology;  Laterality: N/A; 03/07/15: electroconvulsion therapy  BMI    Body Mass Index: 38.44 kg/m      Reproductive/Obstetrics negative OB ROS                               Anesthesia Physical Anesthesia Plan  ASA: 3  Anesthesia Plan: General   Post-op Pain Management: Minimal or no pain anticipated   Induction: Intravenous  PONV Risk Score and Plan: 3 and Propofol infusion and TIVA  Airway Management Planned: Mask  Additional Equipment: None  Intra-op Plan:   Post-operative Plan:   Informed Consent: I have reviewed the patients History and Physical, chart, labs and discussed the procedure including the risks, benefits and alternatives for the proposed anesthesia with the patient or authorized representative who has indicated his/her understanding and acceptance.     Dental Advisory Given  Plan Discussed with: CRNA and Surgeon  Anesthesia Plan Comments: (Discussed risks of anesthesia with patient, including PONV, muscle aches. Rare risks discussed as well, such as cardiorespiratory sequelae, need for airway intervention and its associated risks including lip/dental/eye damage and sore throat, and allergic reactions. Discussed the role of CRNA in patient's perioperative care. Patient understands. Patient informed about increased incidence of above perioperative risk due to high BMI. Patient understands.  )        Anesthesia Quick Evaluation  

## 2023-03-07 NOTE — H&P (Signed)
Melinda [name] is an 52 y.o. female.   Chief Complaint: none HPI: mood is good  Past Medical History:  Diagnosis Date   Depression    Diabetes mellitus without complication (HCC)    Diabetic peripheral neuropathy (HCC) 03/07/15   Diabetic peripheral neuropathy (HCC) 03/07/15   Diabetic peripheral neuropathy (HCC) 03/07/15   GERD (gastroesophageal reflux disease)    Hypercholesterolemia 03/07/15   Hypertension    Obesity 03/07/15   Personality disorder (HCC) 03/07/15   Sinus tachycardia 03/07/15   history of   Suicidal thoughts 03/07/15    Past Surgical History:  Procedure Laterality Date   COLONOSCOPY WITH PROPOFOL N/A 12/01/2018   Procedure: COLONOSCOPY WITH PROPOFOL;  Surgeon: Wyline Mood, MD;  Location: [facility] ENDOSCOPY;  Service: Gastroenterology;  Laterality: N/A;   electroconvulsion therapy  03/07/15    Family History  Problem Relation Age of Onset   Diabetes Mother    Hypertension Father    Breast cancer Neg Hx    Colon cancer Neg Hx    Social History:  reports that she has never smoked. She has never used smokeless tobacco. She reports that she does not drink alcohol and does not use drugs.  Allergies:  Allergies  Allergen Reactions   Prednisone     Increases blood sugar    (Not in a hospital admission)   Results for orders placed or performed during the hospital encounter of 03/07/23 (from the past 48 hour(s))  Glucose, capillary     Status: Abnormal   Collection Time: 03/07/23 11:42 AM  Result Value Ref Range   Glucose-Capillary 103 (H) 70 - 99 mg/dL    Comment: Glucose reference range applies only to samples taken after fasting for at least 8 hours.   *Note: Due to a large number of results and/or encounters for the requested time period, some results have not been displayed. A complete set of results can be found in Results Review.   No results found.  Review of Systems  Constitutional: Negative.   HENT: Negative.    Eyes: Negative.   Respiratory:  Negative.    Cardiovascular: Negative.   Gastrointestinal: Negative.   Musculoskeletal: Negative.   Skin: Negative.   Neurological: Negative.   Psychiatric/Behavioral: Negative.      There were no vitals taken for this visit. Physical Exam Vitals reviewed.  Constitutional:      Appearance: She is well-developed.  HENT:     Head: Normocephalic and atraumatic.  Eyes:     Conjunctiva/sclera: Conjunctivae normal.     Pupils: Pupils are equal, round, and reactive to light.  Cardiovascular:     Heart sounds: Normal heart sounds.  Pulmonary:     Effort: Pulmonary effort is normal.  Abdominal:     Palpations: Abdomen is soft.  Musculoskeletal:        General: Normal range of motion.     Cervical back: Normal range of motion.  Skin:    General: Skin is warm and dry.  Neurological:     General: No focal deficit present.     Mental Status: She is alert.  Psychiatric:        Mood and Affect: Mood normal.        Behavior: Behavior normal.        Thought Content: Thought content normal.        Judgment: Judgment normal.      Assessment/Plan Treatment and follow up 3 weeks  Mordecai Rasmussen, MD 03/07/2023, 12:28 PM

## 2023-03-07 NOTE — Anesthesia Procedure Notes (Signed)
Procedure Name: General with mask airway Date/Time: 03/07/2023 1:13 PM  Performed by: Mathews Argyle, CRNAPre-anesthesia Checklist: Patient identified, Emergency Drugs available, Suction available and Patient being monitored Patient Re-evaluated:Patient Re-evaluated prior to induction Oxygen Delivery Method: Circle system utilized Preoxygenation: Pre-oxygenation with 100% oxygen Induction Type: IV induction

## 2023-04-04 ENCOUNTER — Other Ambulatory Visit: Payer: Self-pay | Admitting: Psychiatry

## 2023-04-04 ENCOUNTER — Encounter: Payer: Self-pay | Admitting: Anesthesiology

## 2023-04-04 ENCOUNTER — Encounter
Admission: RE | Admit: 2023-04-04 | Discharge: 2023-04-04 | Disposition: A | Discharge status: Home / Self Care | Payer: Medicare HMO | Source: Ambulatory Visit | Attending: Family Medicine | Admitting: Family Medicine

## 2023-04-04 ENCOUNTER — Ambulatory Visit: Payer: Self-pay | Admitting: Anesthesiology

## 2023-04-04 DIAGNOSIS — R569 Unspecified convulsions: Secondary | ICD-10-CM | POA: Diagnosis not present

## 2023-04-04 DIAGNOSIS — E119 Type 2 diabetes mellitus without complications: Secondary | ICD-10-CM | POA: Insufficient documentation

## 2023-04-04 DIAGNOSIS — I10 Essential (primary) hypertension: Secondary | ICD-10-CM | POA: Diagnosis not present

## 2023-04-04 DIAGNOSIS — F332 Major depressive disorder, recurrent severe without psychotic features: Secondary | ICD-10-CM | POA: Diagnosis not present

## 2023-04-04 DIAGNOSIS — E7800 Pure hypercholesterolemia, unspecified: Secondary | ICD-10-CM | POA: Insufficient documentation

## 2023-04-04 DIAGNOSIS — Z6841 Body Mass Index (BMI) 40.0 and over, adult: Secondary | ICD-10-CM | POA: Insufficient documentation

## 2023-04-04 DIAGNOSIS — G4730 Sleep apnea, unspecified: Secondary | ICD-10-CM | POA: Diagnosis not present

## 2023-04-04 DIAGNOSIS — F418 Other specified anxiety disorders: Secondary | ICD-10-CM | POA: Diagnosis not present

## 2023-04-04 DIAGNOSIS — K219 Gastro-esophageal reflux disease without esophagitis: Secondary | ICD-10-CM | POA: Diagnosis not present

## 2023-04-04 DIAGNOSIS — E669 Obesity, unspecified: Secondary | ICD-10-CM | POA: Diagnosis not present

## 2023-04-04 LAB — GLUCOSE, CAPILLARY
Glucose-Capillary: 98 mg/dL (ref 70–99)
Glucose-Capillary: 117 mg/dL — ABNORMAL HIGH (ref 70–99)

## 2023-04-04 MED ORDER — SODIUM CHLORIDE 0.9 % IV SOLN: INTRAVENOUS | Status: DC | PRN

## 2023-04-04 MED ORDER — PROPOFOL 10 MG/ML IV BOLUS
INTRAVENOUS | Status: DC | PRN
  Administered 2023-04-04: 40 mg via INTRAVENOUS

## 2023-04-04 MED ORDER — KETAMINE HCL 50 MG/5ML IJ SOSY
PREFILLED_SYRINGE | INTRAMUSCULAR | Status: AC
  Filled 2023-04-04: qty 5

## 2023-04-04 MED ORDER — PROPOFOL 10 MG/ML IV BOLUS
INTRAVENOUS | Status: AC
  Filled 2023-04-04: qty 20

## 2023-04-04 MED ORDER — KETOROLAC TROMETHAMINE 30 MG/ML IJ SOLN
30.0000 mg | Freq: Once | INTRAMUSCULAR | Status: AC
  Administered 2023-04-04: 30 mg via INTRAVENOUS

## 2023-04-04 MED ORDER — GLYCOPYRROLATE 0.2 MG/ML IJ SOLN
INTRAMUSCULAR | Status: AC
  Filled 2023-04-04: qty 2

## 2023-04-04 MED ORDER — FENTANYL CITRATE (PF) 100 MCG/2ML IJ SOLN: 25.0000 ug | INTRAMUSCULAR | Status: DC | PRN

## 2023-04-04 MED ORDER — KETOROLAC TROMETHAMINE 30 MG/ML IJ SOLN
INTRAMUSCULAR | Status: AC
  Filled 2023-04-04: qty 1

## 2023-04-04 MED ORDER — SODIUM CHLORIDE 0.9 % IV SOLN
500.0000 mL | Freq: Once | INTRAVENOUS | Status: AC
  Administered 2023-04-04: 500 mL via INTRAVENOUS

## 2023-04-04 MED ORDER — LABETALOL HCL 5 MG/ML IV SOLN
INTRAVENOUS | Status: DC | PRN
  Administered 2023-04-04: 10 mg via INTRAVENOUS

## 2023-04-04 MED ORDER — ONDANSETRON HCL 4 MG/2ML IJ SOLN: 4.0000 mg | Freq: Once | INTRAMUSCULAR | Status: DC | PRN

## 2023-04-04 MED ORDER — GLYCOPYRROLATE 0.2 MG/ML IJ SOLN
0.4000 mg | Freq: Once | INTRAMUSCULAR | Status: AC
  Administered 2023-04-04: 0.4 mg via INTRAVENOUS

## 2023-04-04 MED ORDER — KETAMINE HCL 10 MG/ML IJ SOLN
INTRAMUSCULAR | Status: DC | PRN
  Administered 2023-04-04: 40 mg via INTRAVENOUS

## 2023-04-04 MED ORDER — SUCCINYLCHOLINE CHLORIDE 200 MG/10ML IV SOSY
PREFILLED_SYRINGE | INTRAVENOUS | Status: DC | PRN
  Administered 2023-04-04: 100 mg via INTRAVENOUS

## 2023-04-04 MED ORDER — ESMOLOL HCL 100 MG/10ML IV SOLN
INTRAVENOUS | Status: DC | PRN
  Administered 2023-04-04: 20 mg via INTRAVENOUS

## 2023-04-04 NOTE — Anesthesia Preprocedure Evaluation (Signed)
Anesthesia Evaluation  Patient identified by MRN, date of birth, ID band Patient awake    Reviewed: Allergy & Precautions, NPO status , Patient's Chart, lab work & pertinent test results  Airway Mallampati: III  TM Distance: >3 FB Neck ROM: Full    Dental  (+) Teeth Intact   Pulmonary neg pulmonary ROS, sleep apnea    Pulmonary exam normal breath sounds clear to auscultation       Cardiovascular Exercise Tolerance: Good hypertension, Pt. on medications negative cardio ROS Normal cardiovascular exam Rhythm:Regular Rate:Normal     Neuro/Psych    Depression Bipolar Disorder   negative neurological ROS  negative psych ROS   GI/Hepatic negative GI ROS, Neg liver ROS,GERD  Medicated,,  Endo/Other  negative endocrine ROSdiabetes, Type 2Hypothyroidism  Morbid obesity  Renal/GU negative Renal ROS  negative genitourinary   Musculoskeletal negative musculoskeletal ROS (+)    Abdominal  (+) + obese  Peds negative pediatric ROS (+)  Hematology negative hematology ROS (+)   Anesthesia Other Findings Past Medical History: No date: Depression No date: Diabetes mellitus without complication (HCC) 03/07/15: Diabetic peripheral neuropathy (HCC) 03/07/15: Diabetic peripheral neuropathy (HCC) 03/07/15: Diabetic peripheral neuropathy (HCC) No date: GERD (gastroesophageal reflux disease) 03/07/15: Hypercholesterolemia No date: Hypertension 03/07/15: Obesity 03/07/15: Personality disorder (HCC) 03/07/15: Sinus tachycardia     Comment:  history of 03/07/15: Suicidal thoughts  Past Surgical History: 12/01/2018: COLONOSCOPY WITH PROPOFOL; N/A     Comment:  Procedure: COLONOSCOPY WITH PROPOFOL;  Surgeon: Wyline Mood, MD;  Location: [facility] ENDOSCOPY;  Service:               Gastroenterology;  Laterality: N/A; 03/07/15: electroconvulsion therapy     Reproductive/Obstetrics negative OB ROS                              Anesthesia Physical Anesthesia Plan  ASA: 3  Anesthesia Plan: General   Post-op Pain Management:    Induction: Intravenous  PONV Risk Score and Plan: Propofol infusion and TIVA  Airway Management Planned: Mask and Natural Airway  Additional Equipment:   Intra-op Plan:   Post-operative Plan:   Informed Consent: I have reviewed the patients History and Physical, chart, labs and discussed the procedure including the risks, benefits and alternatives for the proposed anesthesia with the patient or authorized representative who has indicated his/her understanding and acceptance.     Dental Advisory Given  Plan Discussed with: CRNA and Surgeon  Anesthesia Plan Comments:        Anesthesia Quick Evaluation

## 2023-04-04 NOTE — Transfer of Care (Signed)
Immediate Anesthesia Transfer of Care Note  Patient: Melinda [name]  Procedure(s) Performed: ECT TX  Patient Location: PACU  Anesthesia Type:General  Level of Consciousness: drowsy  Airway & Oxygen Therapy: Patient Spontanous Breathing and Patient connected to face mask oxygen  Post-op Assessment: Report given to RN and Post -op Vital signs reviewed and stable  Post vital signs: Reviewed and stable  Last Vitals:  Vitals Value Taken Time  BP    Temp    Pulse 94 04/04/23 1253  Resp 24 04/04/23 1253  SpO2 98 % 04/04/23 1253  Vitals shown include unvalidated device data.  Last Pain:  Vitals:   04/04/23 1103  TempSrc:   PainSc: 0-No pain         Complications: No notable events documented.

## 2023-04-04 NOTE — Procedures (Signed)
ECT SERVICES Physician's Interval Evaluation & Treatment Note  Patient Identification: Melinda [name] MRN:  [id number] Date of Evaluation:  04/04/2023 TX #: [id number]  MADRS:   MMSE:   P.E. Findings:  No change to physical exam  Psychiatric Interval Note:  Mood good no complaints  Subjective:  Patient is a 52 y.o. female seen for evaluation for Electroconvulsive Therapy. No complaints  Treatment Summary:   []   Right Unilateral             [x]  Bilateral   % Energy : 1.0 ms 35%   Impedance: 1250 ohms  Seizure Energy Index: 3697 V squared  Postictal Suppression Index: 50%  Seizure Concordance Index: 92%  Medications  Pre Shock: Robinul 0.4 mg Toradol 30 mg Brevital 70 mg succinylcholine 100 mg labetalol 20 mg  Post Shock: None  Seizure Duration: 30 seconds EMG 68 seconds EEG   Comments: We will see again before I leave that also she is now asking for referral to [facility]  Lungs:  [x]   Clear to auscultation               []  Other:   Heart:    [x]   Regular rhythm             []  irregular rhythm    [x]   Previous H&P reviewed, patient examined and there are NO CHANGES                 []   Previous H&P reviewed, patient examined and there are changes noted.   Mordecai Rasmussen, MD 5/24/20243:28 PM

## 2023-04-04 NOTE — H&P (Signed)
Melinda [name] is an 52 y.o. female.   Chief Complaint: No complaint.  Stable.  Mood stable. HPI: Recurrent depression stable with ECT  Past Medical History:  Diagnosis Date   Depression    Diabetes mellitus without complication (HCC)    Diabetic peripheral neuropathy (HCC) 03/07/15   Diabetic peripheral neuropathy (HCC) 03/07/15   Diabetic peripheral neuropathy (HCC) 03/07/15   GERD (gastroesophageal reflux disease)    Hypercholesterolemia 03/07/15   Hypertension    Obesity 03/07/15   Personality disorder (HCC) 03/07/15   Sinus tachycardia 03/07/15   history of   Suicidal thoughts 03/07/15    Past Surgical History:  Procedure Laterality Date   COLONOSCOPY WITH PROPOFOL N/A 12/01/2018   Procedure: COLONOSCOPY WITH PROPOFOL;  Surgeon: Wyline Mood, MD;  Location: [facility] ENDOSCOPY;  Service: Gastroenterology;  Laterality: N/A;   electroconvulsion therapy  03/07/15    Family History  Problem Relation Age of Onset   Diabetes Mother    Hypertension Father    Breast cancer Neg Hx    Colon cancer Neg Hx    Social History:  reports that she has never smoked. She has never used smokeless tobacco. She reports that she does not drink alcohol and does not use drugs.  Allergies:  Allergies  Allergen Reactions   Prednisone     Increases blood sugar    (Not in a hospital admission)   Results for orders placed or performed during the hospital encounter of 04/04/23 (from the past 48 hour(s))  Glucose, capillary     Status: None   Collection Time: 04/04/23 11:25 AM  Result Value Ref Range   Glucose-Capillary 98 70 - 99 mg/dL    Comment: Glucose reference range applies only to samples taken after fasting for at least 8 hours.  Glucose, capillary     Status: Abnormal   Collection Time: 04/04/23 12:54 PM  Result Value Ref Range   Glucose-Capillary 117 (H) 70 - 99 mg/dL    Comment: Glucose reference range applies only to samples taken after fasting for at least 8 hours.   *Note: Due to a  large number of results and/or encounters for the requested time period, some results have not been displayed. A complete set of results can be found in Results Review.   No results found.  Review of Systems  Constitutional: Negative.   HENT: Negative.    Eyes: Negative.   Respiratory: Negative.    Cardiovascular: Negative.   Gastrointestinal: Negative.   Musculoskeletal: Negative.   Skin: Negative.   Neurological: Negative.   Psychiatric/Behavioral: Negative.      Blood pressure 120/60, pulse 80, temperature 98.3 F (36.8 C), temperature source Oral, resp. rate 20, SpO2 100 %. Physical Exam Vitals and nursing note reviewed.  Constitutional:      Appearance: She is well-developed.  HENT:     Head: Normocephalic and atraumatic.  Eyes:     Conjunctiva/sclera: Conjunctivae normal.     Pupils: Pupils are equal, round, and reactive to light.  Cardiovascular:     Heart sounds: Normal heart sounds.  Pulmonary:     Effort: Pulmonary effort is normal.  Abdominal:     Palpations: Abdomen is soft.  Musculoskeletal:        General: Normal range of motion.     Cervical back: Normal range of motion.  Skin:    General: Skin is warm and dry.  Neurological:     General: No focal deficit present.     Mental Status: She is alert.  Psychiatric:        Mood and Affect: Mood normal.        Behavior: Behavior normal.        Thought Content: Thought content normal.        Judgment: Judgment normal.      Assessment/Plan Follow-up with another treatment prior to my leaving the service.  Patient now says she wants referral to [facility] so we will work on that.  Mordecai Rasmussen, MD 04/04/2023, 3:27 PM

## 2023-04-04 NOTE — Anesthesia Postprocedure Evaluation (Signed)
Anesthesia Post Note  Patient: Melinda [name]  Procedure(s) Performed: ECT TX  Anesthesia Type: General Level of consciousness: awake Pain management: satisfactory to patient Vital Signs Assessment: post-procedure vital signs reviewed and stable Respiratory status: spontaneous breathing Cardiovascular status: stable Anesthetic complications: no   No notable events documented.   Last Vitals:  Vitals:   04/04/23 1258 04/04/23 1300  BP:  115/71  Pulse: 96 95  Resp: (!) 21 17  Temp:  36.9 C  SpO2: 100% 100%    Last Pain:  Vitals:   04/04/23 1300  TempSrc:   PainSc: 0-No pain                 VAN STAVEREN,Dejanee Thibeaux

## 2023-04-18 ENCOUNTER — Other Ambulatory Visit: Payer: Self-pay | Admitting: Psychiatry

## 2023-04-18 MED ORDER — BUPROPION HCL ER (XL) 300 MG PO TB24: 300.0000 mg | ORAL_TABLET | Freq: Every day | ORAL | 1 refills | Status: DC

## 2023-04-25 ENCOUNTER — Encounter
Admission: RE | Admit: 2023-04-25 | Discharge: 2023-04-25 | Disposition: A | Discharge status: Home / Self Care | Payer: Medicare HMO | Source: Ambulatory Visit | Attending: Family Medicine | Admitting: Family Medicine

## 2023-04-25 ENCOUNTER — Other Ambulatory Visit: Payer: Self-pay | Admitting: Psychiatry

## 2023-04-25 ENCOUNTER — Encounter: Payer: Self-pay | Admitting: General Practice

## 2023-04-25 DIAGNOSIS — G4730 Sleep apnea, unspecified: Secondary | ICD-10-CM | POA: Diagnosis not present

## 2023-04-25 DIAGNOSIS — Z6841 Body Mass Index (BMI) 40.0 and over, adult: Secondary | ICD-10-CM | POA: Diagnosis not present

## 2023-04-25 DIAGNOSIS — I10 Essential (primary) hypertension: Secondary | ICD-10-CM | POA: Diagnosis not present

## 2023-04-25 DIAGNOSIS — F418 Other specified anxiety disorders: Secondary | ICD-10-CM | POA: Diagnosis not present

## 2023-04-25 DIAGNOSIS — E7800 Pure hypercholesterolemia, unspecified: Secondary | ICD-10-CM | POA: Insufficient documentation

## 2023-04-25 DIAGNOSIS — F332 Major depressive disorder, recurrent severe without psychotic features: Secondary | ICD-10-CM | POA: Insufficient documentation

## 2023-04-25 DIAGNOSIS — R569 Unspecified convulsions: Secondary | ICD-10-CM | POA: Insufficient documentation

## 2023-04-25 DIAGNOSIS — K219 Gastro-esophageal reflux disease without esophagitis: Secondary | ICD-10-CM | POA: Diagnosis not present

## 2023-04-25 DIAGNOSIS — E119 Type 2 diabetes mellitus without complications: Secondary | ICD-10-CM | POA: Diagnosis not present

## 2023-04-25 DIAGNOSIS — E669 Obesity, unspecified: Secondary | ICD-10-CM | POA: Insufficient documentation

## 2023-04-25 DIAGNOSIS — Z7984 Long term (current) use of oral hypoglycemic drugs: Secondary | ICD-10-CM | POA: Diagnosis not present

## 2023-04-25 LAB — GLUCOSE, CAPILLARY
Glucose-Capillary: 117 mg/dL — ABNORMAL HIGH (ref 70–99)
Glucose-Capillary: 147 mg/dL — ABNORMAL HIGH (ref 70–99)

## 2023-04-25 MED ORDER — OXYCODONE HCL 5 MG/5ML PO SOLN: 5.0000 mg | Freq: Once | ORAL | Status: DC | PRN

## 2023-04-25 MED ORDER — KETOROLAC TROMETHAMINE 30 MG/ML IJ SOLN
INTRAMUSCULAR | Status: AC
  Filled 2023-04-25: qty 1

## 2023-04-25 MED ORDER — KETOROLAC TROMETHAMINE 30 MG/ML IJ SOLN
30.0000 mg | Freq: Once | INTRAMUSCULAR | Status: AC
  Administered 2023-04-25: 30 mg via INTRAVENOUS

## 2023-04-25 MED ORDER — OXYCODONE HCL 5 MG PO TABS: 5.0000 mg | ORAL_TABLET | Freq: Once | ORAL | Status: DC | PRN

## 2023-04-25 MED ORDER — LABETALOL HCL 5 MG/ML IV SOLN
INTRAVENOUS | Status: DC | PRN
  Administered 2023-04-25: 10 mg via INTRAVENOUS

## 2023-04-25 MED ORDER — KETAMINE HCL 50 MG/5ML IJ SOSY
PREFILLED_SYRINGE | INTRAMUSCULAR | Status: AC
  Filled 2023-04-25: qty 5

## 2023-04-25 MED ORDER — LABETALOL HCL 5 MG/ML IV SOLN
INTRAVENOUS | Status: AC
  Filled 2023-04-25: qty 4

## 2023-04-25 MED ORDER — KETAMINE HCL 10 MG/ML IJ SOLN
INTRAMUSCULAR | Status: DC | PRN
  Administered 2023-04-25: 40 mg via INTRAVENOUS

## 2023-04-25 MED ORDER — PROPOFOL 1000 MG/100ML IV EMUL
INTRAVENOUS | Status: AC
  Filled 2023-04-25: qty 100

## 2023-04-25 MED ORDER — GLYCOPYRROLATE 0.2 MG/ML IJ SOLN
INTRAMUSCULAR | Status: AC
  Filled 2023-04-25: qty 2

## 2023-04-25 MED ORDER — GLYCOPYRROLATE 0.2 MG/ML IJ SOLN
0.4000 mg | Freq: Once | INTRAMUSCULAR | Status: AC
  Administered 2023-04-25: 0.4 mg via INTRAVENOUS

## 2023-04-25 MED ORDER — SUCCINYLCHOLINE CHLORIDE 200 MG/10ML IV SOSY
PREFILLED_SYRINGE | INTRAVENOUS | Status: AC
  Filled 2023-04-25: qty 10

## 2023-04-25 MED ORDER — PROPOFOL 10 MG/ML IV BOLUS
INTRAVENOUS | Status: DC | PRN
  Administered 2023-04-25: 40 mg via INTRAVENOUS

## 2023-04-25 MED ORDER — FENTANYL CITRATE (PF) 100 MCG/2ML IJ SOLN: 25.0000 ug | INTRAMUSCULAR | Status: DC | PRN

## 2023-04-25 MED ORDER — SODIUM CHLORIDE 0.9 % IV SOLN
500.0000 mL | Freq: Once | INTRAVENOUS | Status: AC
  Administered 2023-04-25: 500 mL via INTRAVENOUS

## 2023-04-25 MED ORDER — SUCCINYLCHOLINE CHLORIDE 200 MG/10ML IV SOSY
PREFILLED_SYRINGE | INTRAVENOUS | Status: DC | PRN
  Administered 2023-04-25: 100 mg via INTRAVENOUS

## 2023-04-25 NOTE — H&P (Signed)
Melinda [name] is an 52 y.o. female.   Chief Complaint: No chief complaint HPI: Mood stable  Past Medical History:  Diagnosis Date   Depression    Diabetes mellitus without complication (HCC)    Diabetic peripheral neuropathy (HCC) 03/07/15   Diabetic peripheral neuropathy (HCC) 03/07/15   Diabetic peripheral neuropathy (HCC) 03/07/15   GERD (gastroesophageal reflux disease)    Hypercholesterolemia 03/07/15   Hypertension    Obesity 03/07/15   Personality disorder (HCC) 03/07/15   Sinus tachycardia 03/07/15   history of   Suicidal thoughts 03/07/15    Past Surgical History:  Procedure Laterality Date   COLONOSCOPY WITH PROPOFOL N/A 12/01/2018   Procedure: COLONOSCOPY WITH PROPOFOL;  Surgeon: Wyline Mood, MD;  Location: [facility] ENDOSCOPY;  Service: Gastroenterology;  Laterality: N/A;   electroconvulsion therapy  03/07/15    Family History  Problem Relation Age of Onset   Diabetes Mother    Hypertension Father    Breast cancer Neg Hx    Colon cancer Neg Hx    Social History:  reports that she has never smoked. She has never used smokeless tobacco. She reports that she does not drink alcohol and does not use drugs.  Allergies:  Allergies  Allergen Reactions   Prednisone     Increases blood sugar    (Not in a hospital admission)   Results for orders placed or performed during the hospital encounter of 04/25/23 (from the past 48 hour(s))  Glucose, capillary     Status: Abnormal   Collection Time: 04/25/23 11:11 AM  Result Value Ref Range   Glucose-Capillary 117 (H) 70 - 99 mg/dL    Comment: Glucose reference range applies only to samples taken after fasting for at least 8 hours.  Glucose, capillary     Status: Abnormal   Collection Time: 04/25/23  1:03 PM  Result Value Ref Range   Glucose-Capillary 147 (H) 70 - 99 mg/dL    Comment: Glucose reference range applies only to samples taken after fasting for at least 8 hours.   *Note: Due to a large number of results and/or  encounters for the requested time period, some results have not been displayed. A complete set of results can be found in Results Review.   No results found.  Review of Systems  Constitutional: Negative.   HENT: Negative.    Eyes: Negative.   Respiratory: Negative.    Cardiovascular: Negative.   Gastrointestinal: Negative.   Musculoskeletal: Negative.   Skin: Negative.   Neurological: Negative.   Psychiatric/Behavioral: Negative.      Blood pressure 115/68, pulse 98, temperature 97.8 F (36.6 C), temperature source Temporal, resp. rate 18, SpO2 100 %. Physical Exam Vitals reviewed.  Constitutional:      Appearance: She is well-developed.  HENT:     Head: Normocephalic and atraumatic.  Eyes:     Conjunctiva/sclera: Conjunctivae normal.     Pupils: Pupils are equal, round, and reactive to light.  Cardiovascular:     Heart sounds: Normal heart sounds.  Pulmonary:     Effort: Pulmonary effort is normal.  Abdominal:     Palpations: Abdomen is soft.  Musculoskeletal:        General: Normal range of motion.     Cervical back: Normal range of motion.  Skin:    General: Skin is warm and dry.  Neurological:     General: No focal deficit present.     Mental Status: She is alert.  Psychiatric:  Mood and Affect: Mood normal.      Assessment/Plan Last ECT treatment at our facility.  We are at her request referring her to [facility].  Mordecai Rasmussen, MD 04/25/2023, 6:22 PM

## 2023-04-25 NOTE — Procedures (Signed)
ECT SERVICES Physician's Interval Evaluation & Treatment Note  Patient Identification: Melinda [name] MRN:  [id number] Date of Evaluation:  04/25/2023 TX #: [id number]  MADRS:   MMSE:   P.E. Findings:  No new physical findings  Psychiatric Interval Note:  Stable no complaints  Subjective:  Patient is a 52 y.o. female seen for evaluation for Electroconvulsive Therapy. Feeling fine.  Treatment Summary:   []   Right Unilateral             [x]  Bilateral   % Energy : 1.0 ms 35%   Impedance: 1340 ohms  Seizure Energy Index: 10,367 V squared  Postictal Suppression Index: 34%  Seizure Concordance Index: 94%  Medications  Pre Shock: Robinul 0.4 mg Toradol 30 mg Brevital 70 mg labetalol 20 mg succinylcholine 100 mg  Post Shock: None  Seizure Duration: 40 seconds EMG 90 seconds EEG   Comments: Last in her series of treatments here.  Lungs:  [x]   Clear to auscultation               []  Other:   Heart:    [x]   Regular rhythm             []  irregular rhythm    [x]   Previous H&P reviewed, patient examined and there are NO CHANGES                 []   Previous H&P reviewed, patient examined and there are changes noted.   Mordecai Rasmussen, MD 6/14/20246:22 PM

## 2023-04-25 NOTE — Anesthesia Procedure Notes (Signed)
Procedure Name: General with mask airway Date/Time: 04/25/2023 12:38 PM  Performed by: Elmarie Mainland, CRNAPre-anesthesia Checklist: Patient identified, Emergency Drugs available, Suction available and Patient being monitored Patient Re-evaluated:Patient Re-evaluated prior to induction Oxygen Delivery Method: Circle system utilized Preoxygenation: Pre-oxygenation with 100% oxygen Induction Type: IV induction Ventilation: Mask ventilation without difficulty

## 2023-04-25 NOTE — Transfer of Care (Signed)
Immediate Anesthesia Transfer of Care Note  Patient: Melinda [name]  Procedure(s) Performed: ECT TX  Patient Location: PACU  Anesthesia Type:General  Level of Consciousness: drowsy  Airway & Oxygen Therapy: Patient Spontanous Breathing  Post-op Assessment: Report given to RN and Post -op Vital signs reviewed and stable  Post vital signs: Reviewed and stable  Last Vitals:  Vitals Value Taken Time  BP 114/100 04/25/23 1256  Temp 36.8 C 04/25/23 1256  Pulse 110 04/25/23 1301  Resp 19 04/25/23 1301  SpO2 97 % 04/25/23 1301  Vitals shown include unvalidated device data.  Last Pain:  Vitals:   04/25/23 1256  TempSrc:   PainSc: Asleep         Complications: No notable events documented.

## 2023-04-25 NOTE — Anesthesia Postprocedure Evaluation (Signed)
Anesthesia Post Note  Patient: Melinda [name]  Procedure(s) Performed: ECT TX  Patient location during evaluation: PACU Anesthesia Type: General Level of consciousness: awake and alert Pain management: pain level controlled Vital Signs Assessment: post-procedure vital signs reviewed and stable Respiratory status: spontaneous breathing, nonlabored ventilation, respiratory function stable and patient connected to nasal cannula oxygen Cardiovascular status: blood pressure returned to baseline and stable Postop Assessment: no apparent nausea or vomiting Anesthetic complications: no  No notable events documented.   Last Vitals:  Vitals:   04/25/23 1256 04/25/23 1307  BP: (!) 114/100 113/75  Pulse: (!) 108 (!) 103  Resp: 17 17  Temp: 36.8 C   SpO2: 97% 95%    Last Pain:  Vitals:   04/25/23 1256  TempSrc:   PainSc: Asleep                 Stephanie Coup

## 2023-04-25 NOTE — Anesthesia Preprocedure Evaluation (Signed)
Anesthesia Evaluation  Patient identified by MRN, date of birth, ID band Patient awake    Reviewed: Allergy & Precautions, NPO status , Patient's Chart, lab work & pertinent test results  Airway Mallampati: III  TM Distance: >3 FB Neck ROM: Full    Dental  (+) Teeth Intact   Pulmonary neg pulmonary ROS, sleep apnea    Pulmonary exam normal breath sounds clear to auscultation       Cardiovascular Exercise Tolerance: Good hypertension, Pt. on medications negative cardio ROS Normal cardiovascular exam Rhythm:Regular Rate:Normal     Neuro/Psych  PSYCHIATRIC DISORDERS  Depression Bipolar Disorder   negative neurological ROS     GI/Hepatic negative GI ROS, Neg liver ROS,GERD  Medicated,,  Endo/Other  negative endocrine ROSdiabetes, Type 2Hypothyroidism  Morbid obesity  Renal/GU negative Renal ROS  negative genitourinary   Musculoskeletal negative musculoskeletal ROS (+)    Abdominal  (+) + obese  Peds negative pediatric ROS (+)  Hematology negative hematology ROS (+)   Anesthesia Other Findings Past Medical History: No date: Depression No date: Diabetes mellitus without complication (HCC) 03/07/15: Diabetic peripheral neuropathy (HCC) 03/07/15: Diabetic peripheral neuropathy (HCC) 03/07/15: Diabetic peripheral neuropathy (HCC) No date: GERD (gastroesophageal reflux disease) 03/07/15: Hypercholesterolemia No date: Hypertension 03/07/15: Obesity 03/07/15: Personality disorder (HCC) 03/07/15: Sinus tachycardia     Comment:  history of 03/07/15: Suicidal thoughts  Past Surgical History: 12/01/2018: COLONOSCOPY WITH PROPOFOL; N/A     Comment:  Procedure: COLONOSCOPY WITH PROPOFOL;  Surgeon: Wyline Mood, MD;  Location: [facility] ENDOSCOPY;  Service:               Gastroenterology;  Laterality: N/A; 03/07/15: electroconvulsion therapy     Reproductive/Obstetrics negative OB ROS                              Anesthesia Physical Anesthesia Plan  ASA: 3  Anesthesia Plan: General   Post-op Pain Management: Minimal or no pain anticipated   Induction: Intravenous  PONV Risk Score and Plan: 3 and Propofol infusion and TIVA  Airway Management Planned: Mask and Natural Airway  Additional Equipment: None  Intra-op Plan:   Post-operative Plan:   Informed Consent: I have reviewed the patients History and Physical, chart, labs and discussed the procedure including the risks, benefits and alternatives for the proposed anesthesia with the patient or authorized representative who has indicated his/her understanding and acceptance.     Dental Advisory Given  Plan Discussed with: CRNA and Surgeon  Anesthesia Plan Comments: (Discussed risks of anesthesia with patient, including possibility of difficulty with spontaneous ventilation under anesthesia necessitating airway intervention, PONV, and rare risks such as cardiac or respiratory or neurological events, and allergic reactions. Discussed the role of CRNA in patient's perioperative care. Patient understands.)       Anesthesia Quick Evaluation

## 2023-05-08 ENCOUNTER — Ambulatory Visit: Payer: Medicare HMO | Admitting: Family Medicine

## 2023-05-12 ENCOUNTER — Ambulatory Visit (INDEPENDENT_AMBULATORY_CARE_PROVIDER_SITE_OTHER): Payer: Medicare HMO | Admitting: Family Medicine

## 2023-05-12 VITALS — BP 135/80 | HR 76 | Temp 98.5°F | Ht 61.0 in | Wt 230.0 lb

## 2023-05-12 DIAGNOSIS — Z1152 Encounter for screening for COVID-19: Secondary | ICD-10-CM

## 2023-05-12 LAB — POC COVID19 BINAXNOW: SARS Coronavirus 2 Ag: NEGATIVE

## 2023-05-12 NOTE — Progress Notes (Signed)
Established patient visit   Patient: Melinda [name]   DOB: [birth year]   52 y.o. Female  MRN: [id number] Visit Date: 05/12/2023  Today's healthcare provider: Sherlyn Hay, DO   No chief complaint on file.  Subjective    HPI HPI   Patient needs to have pre-procedure Covid test in order to do ECT on Friday at [facility] Last edited by Adline Peals, CMA on 05/12/2023  2:19 PM.      Patient states that she has had 390 ECTs over since 2006 She has experienced previous self-harm/suicidal ideation, but notes that she has not continued to experience this since starting ECT. First time going to [facility]; previously done at [facility] but her physician transitioned to working at [facility].  She endorses that her blood glucoses are usually in the 90s-120s.  She is due for her follow-up in September and has an appointment currently scheduled.  She experiences a small amount of dizziness when rolling head around or up and down    Medications: Outpatient Medications Prior to Visit  Medication Sig   atorvastatin (LIPITOR) 10 MG tablet TAKE 1 TABLET EVERY DAY   Blood Glucose Monitoring Suppl (TRUE METRIX METER) w/Device KIT USE AS DIRECTED AS NEEDED  FOR  DIABETES  MELLITUS  TYPE 2   buPROPion (WELLBUTRIN XL) 300 MG 24 hr tablet Take 1 tablet (300 mg total) by mouth daily.   empagliflozin (JARDIANCE) 25 MG TABS tablet Take 25 mg by mouth daily.   escitalopram (LEXAPRO) 20 MG tablet Take 1 tablet (20 mg total) by mouth daily.   gabapentin (NEURONTIN) 300 MG capsule Take 1 capsule (300 mg total) by mouth at bedtime.   lisinopril (ZESTRIL) 5 MG tablet TAKE 1 TABLET EVERY DAY, MAY TAKE 2 TABLETS ON DAYS THAT YOU HAVE ECT   meloxicam (MOBIC) 7.5 MG tablet TAKE 1 TABLET EVERY DAY   metFORMIN (GLUCOPHAGE-XR) 500 MG 24 hr tablet TAKE 1 TABLET (500 MG TOTAL) IN THE MORNING AND AT BEDTIME.   TRUE METRIX BLOOD GLUCOSE TEST test strip CHECK FASTING BLOOD SUGAR ONCE DAILY   TRUEplus Lancets 33G MISC CHECK BLOOD  GLUCOSE  LEVEL ONE TIME DAILY   ziprasidone (GEODON) 80 MG capsule TAKE 1 CAPSULE TWICE DAILY   No facility-administered medications prior to visit.    Review of Systems  Constitutional:  Negative for appetite change, chills, fatigue and fever.  Eyes:  Negative for visual disturbance.  Respiratory:  Negative for chest tightness and shortness of breath.   Cardiovascular:  Negative for chest pain and palpitations.  Gastrointestinal:  Negative for abdominal pain, nausea and vomiting.  Neurological:  Positive for dizziness (intermittent; worse with hotter weather). Negative for weakness and light-headedness.       Objective    BP 135/80 (BP Location: Right Arm, Patient Position: Sitting, Cuff Size: Large)   Pulse 76   Temp 98.5 F (36.9 C) (Oral)   Ht 5\' 1"  (1.549 m)   Wt 230 lb (104.3 kg)   LMP  (LMP Unknown) Comment: pt states she has not had period in the past year  SpO2 98%   BMI 43.46 kg/m    Physical Exam Constitutional:      Appearance: Normal appearance.  HENT:     Head: Normocephalic and atraumatic.  Eyes:     General: No scleral icterus.    Extraocular Movements: Extraocular movements intact.     Conjunctiva/sclera: Conjunctivae normal.  Cardiovascular:     Rate and Rhythm: Normal rate  and regular rhythm.     Pulses: Normal pulses.     Heart sounds: Normal heart sounds.  Pulmonary:     Effort: Pulmonary effort is normal. No respiratory distress.     Breath sounds: Normal breath sounds.  Musculoskeletal:     Right lower leg: No edema.     Left lower leg: No edema.  Skin:    General: Skin is warm and dry.          Comments: Varies hypertrophic scars across forearms bilaterally.  Neurological:     Mental Status: She is alert and oriented to person, place, and time. Mental status is at baseline.  Psychiatric:        Mood and Affect: Mood normal. Affect is blunt.        Speech: Speech is delayed.        Behavior: Behavior is slowed. Behavior is cooperative.         Thought Content: Thought content does not include homicidal or suicidal ideation. Thought content does not include homicidal or suicidal plan.      Results for orders placed or performed in visit on 05/12/23  POC COVID-19  Result Value Ref Range   SARS Coronavirus 2 Ag Negative Negative    Assessment & Plan    1. Encounter for screening for COVID-19 POC COVID-19 test negative today.  Patient has no other acute concerns today and will be following up as scheduled 07/22/2023. - POC COVID-19   Return in about 2 months (around 07/22/2023) for Annual w/Dr. B.      The entirety of the information documented in the History of Present Illness, Review of Systems and Physical Exam were personally obtained by me. Portions of this information were initially documented by the CMA, Adline Peals, and reviewed by me for thoroughness and accuracy.   I discussed the assessment and treatment plan with the patient  The patient was provided an opportunity to ask questions and all were answered. The patient agreed with the plan and demonstrated an understanding of the instructions.   The patient was advised to call back or seek an in-person evaluation if the symptoms worsen or if the condition fails to improve as anticipated.    Sherlyn Hay, DO  [facility] [phone] (phone) [phone] (fax)  [facility]

## 2023-05-16 DIAGNOSIS — R6889 Other general symptoms and signs: Secondary | ICD-10-CM | POA: Diagnosis not present

## 2023-05-16 DIAGNOSIS — F332 Major depressive disorder, recurrent severe without psychotic features: Secondary | ICD-10-CM | POA: Diagnosis not present

## 2023-06-02 DIAGNOSIS — H524 Presbyopia: Secondary | ICD-10-CM | POA: Diagnosis not present

## 2023-06-02 DIAGNOSIS — E119 Type 2 diabetes mellitus without complications: Secondary | ICD-10-CM | POA: Diagnosis not present

## 2023-06-02 DIAGNOSIS — R6889 Other general symptoms and signs: Secondary | ICD-10-CM | POA: Diagnosis not present

## 2023-06-02 DIAGNOSIS — H5213 Myopia, bilateral: Secondary | ICD-10-CM | POA: Diagnosis not present

## 2023-06-02 LAB — HM DIABETES EYE EXAM

## 2023-06-03 NOTE — Telephone Encounter (Signed)
Opened in error

## 2023-06-11 ENCOUNTER — Other Ambulatory Visit: Payer: Self-pay

## 2023-06-11 MED ORDER — TRUEPLUS LANCETS 33G MISC: 3 refills | Status: DC

## 2023-06-13 DIAGNOSIS — F332 Major depressive disorder, recurrent severe without psychotic features: Secondary | ICD-10-CM | POA: Diagnosis not present

## 2023-06-13 DIAGNOSIS — R6889 Other general symptoms and signs: Secondary | ICD-10-CM | POA: Diagnosis not present

## 2023-06-27 ENCOUNTER — Other Ambulatory Visit: Payer: Self-pay | Admitting: Family Medicine

## 2023-06-27 DIAGNOSIS — E119 Type 2 diabetes mellitus without complications: Secondary | ICD-10-CM

## 2023-06-30 ENCOUNTER — Other Ambulatory Visit: Payer: Self-pay | Admitting: Family Medicine

## 2023-06-30 DIAGNOSIS — E119 Type 2 diabetes mellitus without complications: Secondary | ICD-10-CM

## 2023-07-04 ENCOUNTER — Telehealth: Payer: Self-pay | Admitting: Family Medicine

## 2023-07-04 MED ORDER — EMPAGLIFLOZIN 25 MG PO TABS: 25.0000 mg | ORAL_TABLET | Freq: Every day | ORAL | 0 refills | Status: DC

## 2023-07-04 NOTE — Telephone Encounter (Signed)
Verbal order was given to the pharmacist with BI program Qty:90 R:1 ok by Dr. Leonard Schwartz

## 2023-07-04 NOTE — Telephone Encounter (Signed)
Medication Refill - Medication:  empagliflozin (JARDIANCE) 25 MG TABS tablet almost out, used the last refill on it   Has the patient contacted their pharmacy? Yes.  Advised to contact PCP  Preferred Pharmacy (with phone number or street name):   Patient stated it needs to be sent to [facility]  [facility] [place] (could not find this pharmacy under preferred pharmacies)  Phone #[phone] [address] [place], [place] [address]   Has the patient been seen for an appointment in the last year OR does the patient have an upcoming appointment? YES . F/U on 07/22/23 with PCP

## 2023-07-11 DIAGNOSIS — Z888 Allergy status to other drugs, medicaments and biological substances status: Secondary | ICD-10-CM | POA: Diagnosis not present

## 2023-07-11 DIAGNOSIS — R6889 Other general symptoms and signs: Secondary | ICD-10-CM | POA: Diagnosis not present

## 2023-07-11 DIAGNOSIS — F332 Major depressive disorder, recurrent severe without psychotic features: Secondary | ICD-10-CM | POA: Diagnosis not present

## 2023-07-22 ENCOUNTER — Telehealth (INDEPENDENT_AMBULATORY_CARE_PROVIDER_SITE_OTHER): Payer: Medicare HMO | Admitting: Family Medicine

## 2023-07-22 ENCOUNTER — Encounter: Payer: Self-pay | Admitting: Family Medicine

## 2023-07-22 DIAGNOSIS — E785 Hyperlipidemia, unspecified: Secondary | ICD-10-CM | POA: Diagnosis not present

## 2023-07-22 DIAGNOSIS — F333 Major depressive disorder, recurrent, severe with psychotic symptoms: Secondary | ICD-10-CM | POA: Insufficient documentation

## 2023-07-22 DIAGNOSIS — Z7984 Long term (current) use of oral hypoglycemic drugs: Secondary | ICD-10-CM

## 2023-07-22 DIAGNOSIS — I152 Hypertension secondary to endocrine disorders: Secondary | ICD-10-CM

## 2023-07-22 DIAGNOSIS — E038 Other specified hypothyroidism: Secondary | ICD-10-CM | POA: Diagnosis not present

## 2023-07-22 DIAGNOSIS — E1142 Type 2 diabetes mellitus with diabetic polyneuropathy: Secondary | ICD-10-CM | POA: Diagnosis not present

## 2023-07-22 DIAGNOSIS — E1159 Type 2 diabetes mellitus with other circulatory complications: Secondary | ICD-10-CM | POA: Diagnosis not present

## 2023-07-22 DIAGNOSIS — E1169 Type 2 diabetes mellitus with other specified complication: Secondary | ICD-10-CM | POA: Diagnosis not present

## 2023-07-22 DIAGNOSIS — G4719 Other hypersomnia: Secondary | ICD-10-CM

## 2023-07-22 DIAGNOSIS — Z6841 Body Mass Index (BMI) 40.0 and over, adult: Secondary | ICD-10-CM

## 2023-07-22 MED ORDER — ESCITALOPRAM OXALATE 20 MG PO TABS: 20.0000 mg | ORAL_TABLET | Freq: Every day | ORAL | 0 refills | Status: DC

## 2023-07-22 MED ORDER — ZIPRASIDONE HCL 80 MG PO CAPS: ORAL_CAPSULE | ORAL | 0 refills | Status: DC

## 2023-07-22 MED ORDER — BUPROPION HCL ER (XL) 300 MG PO TB24: 300.0000 mg | ORAL_TABLET | Freq: Every day | ORAL | 0 refills | Status: DC

## 2023-07-22 NOTE — Assessment & Plan Note (Signed)
Patient reports blood sugars ranging from 102 to 160. -Continue current regimen of Empagliflozin 25mg  daily and Metformin 500mg  BID. -Refill Empagliflozin (Jardiance) 25mg  daily and coordinate with pharmacist for patient's [facility] Cares program. -Recheck A1C.

## 2023-07-22 NOTE — Assessment & Plan Note (Signed)
Discussed importance of healthy weight management Discussed diet and exercise  

## 2023-07-22 NOTE — Assessment & Plan Note (Signed)
Patient ran out of Ziprasidone (Geodon), Escitalopram (Lexapro), and Bupropion (Wellbutrin) due to prescribing physician's retirement. -Refill Ziprasidone 80mg  BID, Escitalopram 20mg  daily, and Bupropion XL 300mg  daily.

## 2023-07-22 NOTE — Assessment & Plan Note (Signed)
Recheck TSH and free T4 

## 2023-07-22 NOTE — Assessment & Plan Note (Signed)
Well controlled with gabapentin - continue 300mg  qhs

## 2023-07-22 NOTE — Progress Notes (Signed)
MyChart Video Visit    Virtual Visit via Video Note   This format is felt to be most appropriate for this patient at this time. Physical exam was limited by quality of the video and audio technology used for the visit.    Patient location: home Provider location: [facility] Persons involved in the visit: patient, provider   I discussed the limitations of evaluation and management by telemedicine and the availability of in person appointments. The patient expressed understanding and agreed to proceed.  Patient: Melinda [name]   DOB: 1971/06/17   52 y.o. Female  MRN: [id number] Visit Date: 07/22/2023  Today's healthcare provider: Shirlee Latch, MD   Chief Complaint  Patient presents with   follow-up Chronic disease   Subjective    HPI  Discussed the use of AI scribe software for clinical note transcription with the patient, who gave verbal consent to proceed.  History of Present Illness   The patient, with a history of diabetes and hypertension, presents with fatigue and disrupted sleep over the past two to three months. They report feeling 'really, really tired' throughout the day, despite sleeping during the day. Their sleep is frequently interrupted, with periods of wakefulness every two to three hours. They deny snoring and have never had a sleep study.  In addition to the sleep issues, the patient recently stopped taking meloxicam due to incontinence, which has since resolved. They also report running out of their psychiatric medications (ziprasidone, bupropion, and escitalopram) due to a change in their prescribing physician.  The patient also mentions that their blood sugar has been well-controlled, with recent readings ranging from 102 to 160. They have not checked their blood pressure recently, but report it was 'fine' at their last check.        Medications: Outpatient Medications Prior to Visit  Medication Sig   atorvastatin (LIPITOR) 10 MG  tablet TAKE 1 TABLET EVERY DAY   Blood Glucose Monitoring Suppl (TRUE METRIX METER) w/Device KIT USE AS DIRECTED AS NEEDED  FOR  DIABETES  MELLITUS  TYPE 2   empagliflozin (JARDIANCE) 25 MG TABS tablet Take 1 tablet (25 mg total) by mouth daily.   gabapentin (NEURONTIN) 300 MG capsule Take 1 capsule (300 mg total) by mouth at bedtime.   lisinopril (ZESTRIL) 5 MG tablet TAKE 1 TABLET EVERY DAY, MAY TAKE 2 TABLETS ON DAYS THAT YOU HAVE ECT   metFORMIN (GLUCOPHAGE-XR) 500 MG 24 hr tablet TAKE 1 TABLET (500 MG TOTAL) IN THE MORNING AND AT BEDTIME.   TRUE METRIX BLOOD GLUCOSE TEST test strip CHECK FASTING BLOOD SUGAR ONCE DAILY   TRUEplus Lancets 33G MISC Use to check blood glucose level one time daily.   [DISCONTINUED] buPROPion (WELLBUTRIN XL) 300 MG 24 hr tablet Take 1 tablet (300 mg total) by mouth daily.   [DISCONTINUED] escitalopram (LEXAPRO) 20 MG tablet Take 1 tablet (20 mg total) by mouth daily.   [DISCONTINUED] ziprasidone (GEODON) 80 MG capsule TAKE 1 CAPSULE TWICE DAILY   [DISCONTINUED] meloxicam (MOBIC) 7.5 MG tablet TAKE 1 TABLET EVERY DAY (Patient not taking: Reported on 07/22/2023)   No facility-administered medications prior to visit.    Review of Systems      Objective    LMP  (LMP Unknown) Comment: pt states she has not had period in the past year      Physical Exam Constitutional:      General: She is not in acute distress.    Appearance: Normal appearance.  HENT:  Head: Normocephalic.  Pulmonary:     Effort: Pulmonary effort is normal. No respiratory distress.  Neurological:     Mental Status: She is alert and oriented to person, place, and time. Mental status is at baseline.        Assessment & Plan     Problem List Items Addressed This Visit       Cardiovascular and Mediastinum   Hypertension associated with diabetes (HCC) - Primary    Well controlled Continue current medications Recheck metabolic panel F/u in 6 months       Relevant Orders    Comprehensive metabolic panel   AMB Referral to Pharmacy Medication Management     Endocrine   T2DM (type 2 diabetes mellitus) (HCC)    Patient reports blood sugars ranging from 102 to 160. -Continue current regimen of Empagliflozin 25mg  daily and Metformin 500mg  BID. -Refill Empagliflozin (Jardiance) 25mg  daily and coordinate with pharmacist for patient's [facility] Cares program. -Recheck A1C.      Relevant Orders   Hemoglobin A1c   AMB Referral to Pharmacy Medication Management   Hyperlipidemia associated with type 2 diabetes mellitus (HCC)    Well controlled on last labs Continue statin  Recheck annually      Relevant Orders   AMB Referral to Pharmacy Medication Management   Subclinical hypothyroidism    Recheck TSH and free T4       Relevant Orders   TSH + free T4   Diabetic polyneuropathy associated with type 2 diabetes mellitus (HCC)    Well controlled with gabapentin - continue 300mg  qhs      Relevant Medications   buPROPion (WELLBUTRIN XL) 300 MG 24 hr tablet   escitalopram (LEXAPRO) 20 MG tablet   ziprasidone (GEODON) 80 MG capsule   Other Relevant Orders   AMB Referral to Pharmacy Medication Management     Other   Morbid obesity with BMI of 40.0-44.9, adult (HCC) (Chronic)    Discussed importance of healthy weight management Discussed diet and exercise       Relevant Orders   AMB Referral to Pharmacy Medication Management   Major depressive disorder, recurrent, severe with psychotic symptoms (HCC)    Patient ran out of Ziprasidone (Geodon), Escitalopram (Lexapro), and Bupropion (Wellbutrin) due to prescribing physician's retirement. -Refill Ziprasidone 80mg  BID, Escitalopram 20mg  daily, and Bupropion XL 300mg  daily.      Relevant Medications   buPROPion (WELLBUTRIN XL) 300 MG 24 hr tablet   escitalopram (LEXAPRO) 20 MG tablet   Other Relevant Orders   AMB Referral to Pharmacy Medication Management   Other Visit Diagnoses     Excessive daytime  sleepiness       Relevant Orders   Ambulatory referral to Sleep Studies   CBC with Differential/Platelet   Vitamin B12   VITAMIN D 25 Hydroxy (Vit-D Deficiency, Fractures)            Fatigue and Disrupted Sleep Chronic fatigue and disrupted sleep pattern over the past 2-3 months. Suspected sleep apnea due to disrupted sleep pattern. -Order home sleep study. -Order labs including CBC, CMP, Vitamin D, B12, and thyroid function tests.  Medication Management Patient stopped Meloxicam due to incontinence issues which have resolved since discontinuation.  -Remove Meloxicam from medication list.  Follow-up -Schedule physical in 6 months. -Check labs at office when patient can arrange transportation. -Pharmacist to contact patient regarding medication refills through [facility] program.        Return in about 6 months (around 01/19/2024) for CPE.  I discussed the assessment and treatment plan with the patient. The patient was provided an opportunity to ask questions and all were answered. The patient agreed with the plan and demonstrated an understanding of the instructions.   The patient was advised to call back or seek an in-person evaluation if the symptoms worsen or if the condition fails to improve as anticipated.    Shirlee Latch, MD [facility] [phone] (phone) [phone] (fax)  [facility]

## 2023-07-22 NOTE — Assessment & Plan Note (Signed)
Well controlled on last labs Continue statin  Recheck annually

## 2023-07-22 NOTE — Assessment & Plan Note (Signed)
Well controlled Continue current medications Recheck metabolic panel F/u in 6 months  

## 2023-07-23 ENCOUNTER — Other Ambulatory Visit: Payer: Medicare HMO | Admitting: Pharmacist

## 2023-07-23 ENCOUNTER — Telehealth: Payer: Self-pay

## 2023-07-23 DIAGNOSIS — I10 Essential (primary) hypertension: Secondary | ICD-10-CM

## 2023-07-23 NOTE — Progress Notes (Signed)
   Care Guide Note  07/23/2023 Name: JOEANNA PFANNENSTIEL MRN: [id number] DOB: 02-Nov-1971  Referred by: Erasmo Downer, MD Reason for referral : Care Coordination (Outreach to schedule with Pharm d )   Nicholaus Corolla Mulvey is a 52 y.o. year old female who is a primary care patient of Bacigalupo, Marzella Schlein, MD. Nicholaus Corolla Nakamura was referred to the pharmacist for assistance related to DM.    Successful contact was made with the patient to discuss pharmacy services including being ready for the pharmacist to call at least 5 minutes before the scheduled appointment time, to have medication bottles and any blood sugar or blood pressure readings ready for review. The patient agreed to meet with the pharmacist via with the pharmacist via telephone visit on (date/time).  07/23/2023  Penne Lash, RMA Care Guide [facility]  [place], [place] [address] Direct Dial: [phone] [email or web address]

## 2023-07-23 NOTE — Patient Instructions (Signed)
Ruari,  Thank you for speaking with me today! As discussed, we will work on getting your jardiance renewed through the company. You may see an application in the mail if we need to do paperwork.  I am also going to discuss an increase in dose of your lisinopril with Dr. Leonard Schwartz, this will likely bring you into goal blood pressure range, which is important to prevent future heart attacks or strokes.  I will call you in 2-4 weeks to see how the jardiance application is going.  Take care, Elmarie Shiley, PharmD, BCPS Clinical Pharmacist [facility]

## 2023-07-23 NOTE — Progress Notes (Signed)
07/23/2023 Name: Melinda [name] MRN: [id number] DOB: 1971-06-19   Melinda [name] is a 52 y.o. year old female who presented for a telephone visit.   They were referred to the pharmacist by their PCP for assistance in managing medication access - jardiance.   - Previously receives jardiance via [facility], just got the last refill of it  - Medication list review: confirmed pharmacy receipt of bupropion, escitalopram, and ziprasodone sent 07/22/23 to [facility] by PCP resuming care of these prescriptions whereas they had previously been written by specialist.   Subjective:  Care Team: Primary Care Provider: Erasmo Downer, MD   Medication Access/Adherence  Current Pharmacy:  [facility] - [place], [place] - [address] [place] [address] Phone: [phone] Fax: [phone]  [facility] [address] [place] - [address] [place] [address] Phone: [phone] Fax: [phone]   Patient reports affordability concerns with their medications: Yes ziprasodone $14 per 90DS  Patient reports access/transportation concerns to their pharmacy: Yes - doing mail order to alleviate burden. Has transportation through medical insurance company Patient reports adherence concerns with their medications:  No     Diabetes:  Current medications: jardiance 25mg  daily,  Medications tried in the past:   Current glucose readings: 100-140s or 150s, fasting Using traditional meter; testing 1 times daily  Current medication access support: jardiance via [employer]  Hypertension:  Current medications: lisinopril 5mg  once daily, takes 2 tablets daily on ECT days  Patient does not have a validated, automated, upper arm home BP cuff  Patient denies hypotensive s/sx including dizziness, lightheadedness.  Patient denies hypertensive symptoms including headache, chest pain, shortness of  breath      Objective:  Lab Results  Component Value Date   HGBA1C 6.4 (H) 01/16/2023    Lab Results  Component Value Date   CREATININE 0.74 01/16/2023   BUN 11 01/16/2023   NA 144 01/16/2023   K 4.6 01/16/2023   CL 109 (H) 01/16/2023   CO2 22 01/16/2023    Lab Results  Component Value Date   CHOL 132 01/16/2023   HDL 52 01/16/2023   LDLCALC 54 01/16/2023   TRIG 157 (H) 01/16/2023   CHOLHDL 2.5 01/16/2023    Medications Reviewed Today     Reviewed by Melinda [name], RPH (Pharmacist) on 07/23/23 at 1425  Med List Status: <None>   Medication Order Taking? Sig Documenting Provider Last Dose Status Informant  atorvastatin (LIPITOR) 10 MG tablet [id number] Yes TAKE 1 TABLET EVERY DAY Bacigalupo, Marzella Schlein, MD Taking Active   Blood Glucose Monitoring Suppl (TRUE METRIX METER) w/Device KIT [id number] Yes USE AS DIRECTED AS NEEDED  FOR  DIABETES  MELLITUS  TYPE 2 Jodi Marble, Adriana M, PA-C Taking Active   buPROPion (WELLBUTRIN XL) 300 MG 24 hr tablet [id number] Yes Take 1 tablet (300 mg total) by mouth daily. Erasmo Downer, MD Taking Active   empagliflozin (JARDIANCE) 25 MG TABS tablet [id number] Yes Take 1 tablet (25 mg total) by mouth daily. Erasmo Downer, MD Taking Active   escitalopram (LEXAPRO) 20 MG tablet [id number] Yes Take 1 tablet (20 mg total) by mouth daily. Erasmo Downer, MD Taking Active   gabapentin (NEURONTIN) 300 MG capsule [id number] Yes Take 1 capsule (300 mg total) by mouth at bedtime. Erasmo Downer, MD Taking Active   lisinopril (ZESTRIL) 5 MG tablet [id number] Yes TAKE 1 TABLET EVERY DAY, MAY TAKE 2  TABLETS ON DAYS THAT YOU HAVE ECT Erasmo Downer, MD Taking Active   metFORMIN (GLUCOPHAGE-XR) 500 MG 24 hr tablet [id number] Yes TAKE 1 TABLET (500 MG TOTAL) IN THE MORNING AND AT BEDTIME. Erasmo Downer, MD Taking Active   TRUE METRIX BLOOD GLUCOSE TEST test strip [id number] Yes CHECK FASTING BLOOD SUGAR ONCE DAILY Jacky Kindle,  FNP Taking Active   TRUEplus Lancets 33G MISC [id number] Yes Use to check blood glucose level one time daily. Simmons-Robinson, Makiera, MD Taking Active   ziprasidone (GEODON) 80 MG capsule [id number] No TAKE 1 CAPSULE TWICE DAILY  Patient not taking: Reported on 07/23/2023   Erasmo Downer, MD Not Taking Active             Assessment/Plan:   Diabetes: - Currently controlled - Reviewed long term cardiovascular and renal outcomes of uncontrolled blood sugar - Reviewed goal A1c, goal fasting, and goal 2 hour post prandial glucose - Recommend to continue current regimen  - Meets financial criteria for jardiance patient assistance program through [employer]. Will collaborate with provider, CPhT, and patient to pursue assistance - prior PAP exists, will facilitate ongoing application.   Hypertension: - Currently uncontrolled, but very close to goal of <130/80 - Reviewed long term cardiovascular and renal outcomes of uncontrolled blood pressure - Counseled patient on purchase of omron brand upper arm blood pressure cuff, importance of controlled blood pressure to optimize prevention of future heart attacks and strokes - Recommend to consider increase to lisinopril 10mg  daily, will notify PCP     Follow Up Plan: 2-4 weeks to confirm PAP application process  Lynnda Shields, PharmD, BCPS Clinical Pharmacist [facility]

## 2023-07-24 DIAGNOSIS — G4719 Other hypersomnia: Secondary | ICD-10-CM | POA: Diagnosis not present

## 2023-07-24 DIAGNOSIS — I152 Hypertension secondary to endocrine disorders: Secondary | ICD-10-CM | POA: Diagnosis not present

## 2023-07-24 DIAGNOSIS — E1159 Type 2 diabetes mellitus with other circulatory complications: Secondary | ICD-10-CM | POA: Diagnosis not present

## 2023-07-24 DIAGNOSIS — E1169 Type 2 diabetes mellitus with other specified complication: Secondary | ICD-10-CM | POA: Diagnosis not present

## 2023-07-24 DIAGNOSIS — E038 Other specified hypothyroidism: Secondary | ICD-10-CM | POA: Diagnosis not present

## 2023-07-25 LAB — CBC WITH DIFFERENTIAL/PLATELET
Basophils Absolute: 0 10*3/uL (ref 0.0–0.2)
Basos: 0 %
EOS (ABSOLUTE): 0.1 10*3/uL (ref 0.0–0.4)
Eos: 1 %
Hematocrit: 43.4 % (ref 34.0–46.6)
Hemoglobin: 13.8 g/dL (ref 11.1–15.9)
Immature Grans (Abs): 0 10*3/uL (ref 0.0–0.1)
Immature Granulocytes: 0 %
Lymphocytes Absolute: 3.4 10*3/uL — ABNORMAL HIGH (ref 0.7–3.1)
Lymphs: 42 %
MCH: 26 pg — ABNORMAL LOW (ref 26.6–33.0)
MCHC: 31.8 g/dL (ref 31.5–35.7)
MCV: 82 fL (ref 79–97)
Monocytes Absolute: 0.7 10*3/uL (ref 0.1–0.9)
Monocytes: 9 %
Neutrophils Absolute: 3.9 10*3/uL (ref 1.4–7.0)
Neutrophils: 48 %
Platelets: 333 10*3/uL (ref 150–450)
RBC: 5.31 x10E6/uL — ABNORMAL HIGH (ref 3.77–5.28)
RDW: 12.6 % (ref 11.7–15.4)
WBC: 8.1 10*3/uL (ref 3.4–10.8)

## 2023-07-25 LAB — COMPREHENSIVE METABOLIC PANEL
ALT: 13 IU/L (ref 0–32)
AST: 14 IU/L (ref 0–40)
Albumin: 4.3 g/dL (ref 3.8–4.9)
Alkaline Phosphatase: 143 IU/L — ABNORMAL HIGH (ref 44–121)
BUN/Creatinine Ratio: 18 (ref 9–23)
BUN: 13 mg/dL (ref 6–24)
Bilirubin Total: 0.3 mg/dL (ref 0.0–1.2)
CO2: 25 mmol/L (ref 20–29)
Calcium: 10.2 mg/dL (ref 8.7–10.2)
Chloride: 101 mmol/L (ref 96–106)
Creatinine, Ser: 0.73 mg/dL (ref 0.57–1.00)
Globulin, Total: 2.7 g/dL (ref 1.5–4.5)
Glucose: 93 mg/dL (ref 70–99)
Potassium: 4.6 mmol/L (ref 3.5–5.2)
Sodium: 138 mmol/L (ref 134–144)
Total Protein: 7 g/dL (ref 6.0–8.5)
eGFR: 98 mL/min/{1.73_m2} (ref >59)

## 2023-07-25 LAB — TSH+FREE T4
Free T4: 1.17 ng/dL (ref 0.82–1.77)
TSH: 3.15 u[IU]/mL (ref 0.450–4.500)

## 2023-07-25 LAB — VITAMIN B12: Vitamin B-12: 355 pg/mL (ref 232–1245)

## 2023-07-25 LAB — HEMOGLOBIN A1C
Est. average glucose Bld gHb Est-mCnc: 157 mg/dL
Hgb A1c MFr Bld: 7.1 % — ABNORMAL HIGH (ref 4.8–5.6)

## 2023-07-25 LAB — VITAMIN D 25 HYDROXY (VIT D DEFICIENCY, FRACTURES): Vit D, 25-Hydroxy: 14.7 ng/mL — ABNORMAL LOW (ref 30.0–100.0)

## 2023-07-25 MED ORDER — LISINOPRIL 10 MG PO TABS: 10.0000 mg | ORAL_TABLET | Freq: Every day | ORAL | 1 refills | Status: DC

## 2023-07-25 NOTE — Addendum Note (Signed)
Addended by: Erasmo Downer on: 07/25/2023 08:03 AM   Modules accepted: Orders

## 2023-07-31 ENCOUNTER — Encounter: Payer: Self-pay | Admitting: Family Medicine

## 2023-08-01 DIAGNOSIS — G4730 Sleep apnea, unspecified: Secondary | ICD-10-CM | POA: Diagnosis not present

## 2023-08-07 ENCOUNTER — Other Ambulatory Visit: Payer: Medicare HMO | Admitting: Pharmacist

## 2023-08-07 NOTE — Progress Notes (Signed)
08/07/2023 Name: Melinda [name] MRN: [id number] DOB: [birth year]   Melinda [name] is a 52 y.o. year old female who presented for a telephone visit.   They were referred to the pharmacist by their PCP for assistance in managing medication access - jardiance.   - Previously received jardiance via [facility], just got the last refill of it, though uncertain of current status.   Subjective:  Care Team: Primary Care Provider: Erasmo Downer, MD   Medication Access/Adherence  Current Pharmacy:  [facility] - [place], [place] - [address] [place] [address] Phone: [phone] Fax: [phone]  [facility] [address] [place] - [address] [place] [address] Phone: [phone] Fax: [phone]   Diabetes:  Current medications: jardiance 25mg  daily, metformin 500mg  BID  Current glucose readings: 100-140s or 150s, fasting Using traditional meter; testing 1 times daily  Current medication access support: jardiance via [employer]  Hypertension:  Current medications: lisinopril 10mg  daily, recently increased   Patient does not have a validated, automated, upper arm home BP cuff  Patient denies hypotensive s/sx including dizziness, lightheadedness.  Patient denies hypertensive symptoms including headache, chest pain, shortness of breath      Objective:  Lab Results  Component Value Date   HGBA1C 7.1 (H) 07/24/2023    Lab Results  Component Value Date   CREATININE 0.73 07/24/2023   BUN 13 07/24/2023   NA 138 07/24/2023   K 4.6 07/24/2023   CL 101 07/24/2023   CO2 25 07/24/2023    Lab Results  Component Value Date   CHOL 132 01/16/2023   HDL 52 01/16/2023   LDLCALC 54 01/16/2023   TRIG 157 (H) 01/16/2023   CHOLHDL 2.5 01/16/2023    Medications Reviewed Today     Reviewed by Gabriel Carina, RPH (Pharmacist) on 08/07/23 at 2221  Med List Status: <None>   Medication  Order Taking? Sig Documenting Provider Last Dose Status Informant  atorvastatin (LIPITOR) 10 MG tablet [id number] No TAKE 1 TABLET EVERY DAY Bacigalupo, Marzella Schlein, MD Taking Active   Blood Glucose Monitoring Suppl (TRUE METRIX METER) w/Device KIT [id number] No USE AS DIRECTED AS NEEDED  FOR  DIABETES  MELLITUS  TYPE 2 Pollak, Adriana M, PA-C Taking Active   buPROPion (WELLBUTRIN XL) 300 MG 24 hr tablet [id number] No Take 1 tablet (300 mg total) by mouth daily. Erasmo Downer, MD Taking Active   empagliflozin (JARDIANCE) 25 MG TABS tablet [id number] No Take 1 tablet (25 mg total) by mouth daily. Erasmo Downer, MD Taking Active   escitalopram (LEXAPRO) 20 MG tablet [id number] No Take 1 tablet (20 mg total) by mouth daily. Erasmo Downer, MD Taking Active   gabapentin (NEURONTIN) 300 MG capsule [id number] No Take 1 capsule (300 mg total) by mouth at bedtime. Erasmo Downer, MD Taking Active   lisinopril (ZESTRIL) 10 MG tablet [id number]  Take 1 tablet (10 mg total) by mouth daily. Erasmo Downer, MD  Active   metFORMIN (GLUCOPHAGE-XR) 500 MG 24 hr tablet [id number] No TAKE 1 TABLET (500 MG TOTAL) IN THE MORNING AND AT BEDTIME. Erasmo Downer, MD Taking Active   TRUE METRIX BLOOD GLUCOSE TEST test strip [id number] No CHECK FASTING BLOOD SUGAR ONCE DAILY Jacky Kindle, FNP Taking Active   TRUEplus Lancets 33G MISC [id number] No Use to check blood glucose level one time daily. Simmons-Robinson, Tawanna Cooler, MD Taking Active   ziprasidone (GEODON)  80 MG capsule [id number] No TAKE 1 CAPSULE TWICE DAILY  Patient not taking: Reported on 07/23/2023   Erasmo Downer, MD Not Taking Active             Assessment/Plan:   Diabetes: - Currently controlled - Reviewed long term cardiovascular and renal outcomes of uncontrolled blood sugar - Reviewed goal A1c, goal fasting, and goal 2 hour post prandial glucose - Recommend to continue current regimen  - Meets financial criteria for  jardiance patient assistance program through [employer]. Will collaborate with provider, CPhT, and patient to pursue assistance - prior PAP exists. Recommended patient contacted [employer] to request refill. Patient did this, and contacted [employer]guide to notify that manufacturer states they need application faxed. Will facilitate with rx med assistance team.  Hypertension: - Currently uncontrolled, but very close to goal of <130/80 - Reviewed long term cardiovascular and renal outcomes of uncontrolled blood pressure - Counseled patient on purchase of omron brand upper arm blood pressure cuff, importance of controlled blood pressure to optimize prevention of future heart attacks and strokes - Recommend to check BP on newly increased dose of lisinopril 10mg  daily, document, and provide at future appointments.     Follow Up Plan: 2-4 weeks to confirm PAP application process  Lynnda Shields, PharmD, BCPS Clinical Pharmacist [facility]

## 2023-08-07 NOTE — Patient Instructions (Signed)
Dezyrae,  I sent a message to our medication assistance team to work on your jardiance paperwork. We will be in touch.   Thanks, Thurston Hole

## 2023-08-08 DIAGNOSIS — F332 Major depressive disorder, recurrent severe without psychotic features: Secondary | ICD-10-CM | POA: Diagnosis not present

## 2023-08-08 DIAGNOSIS — Z888 Allergy status to other drugs, medicaments and biological substances status: Secondary | ICD-10-CM | POA: Diagnosis not present

## 2023-08-08 DIAGNOSIS — R6889 Other general symptoms and signs: Secondary | ICD-10-CM | POA: Diagnosis not present

## 2023-08-21 ENCOUNTER — Telehealth: Payer: Self-pay

## 2023-08-21 ENCOUNTER — Other Ambulatory Visit: Payer: Medicare HMO | Admitting: Pharmacist

## 2023-08-21 MED ORDER — EMPAGLIFLOZIN 25 MG PO TABS: 25.0000 mg | ORAL_TABLET | Freq: Every day | ORAL | 1 refills | Status: DC

## 2023-08-21 NOTE — Progress Notes (Signed)
08/21/2023 Name: Melinda [name] MRN: [id number] DOB: 02/07/1971   Melinda [name] is a 52 y.o. year old female who presented for a telephone visit.   They were referred to the pharmacist by their PCP for assistance in managing medication access - jardiance.   - Previously received jardiance via [facility], dispensing pharmacy for patient assistance was needing refills.   Subjective:  Care Team: Primary Care Provider: Erasmo Downer, MD   Medication Access/Adherence  Current Pharmacy:  [facility] - [place], [place] - [address] [place] [address] Phone: [phone] Fax: [phone]  [facility] [address] [place] - [address] [place] [address] Phone: [phone] Fax: [phone]  [facility] - [place], [place] - [address] [place] [address] Phone: [phone] Fax: [phone]   Diabetes:  Current medications: jardiance 25mg  daily, metformin 500mg  BID  Current glucose readings: 100-140s or 150s, fasting Using traditional meter; testing 1 times daily  Current medication access support: jardiance via [employer]  Hypertension:  Current medications: lisinopril 10mg  daily, recently increased   Patient does not have a validated, automated, upper arm home BP cuff. Previously counseled on options to obtain BP cuff. She has not yet purchased one or checked BP at local pharmacy/grocery store.  Patient denies hypotensive s/sx including dizziness, lightheadedness.  Patient denies hypertensive symptoms including headache, chest pain, shortness of breath      Objective:  Lab Results  Component Value Date   HGBA1C 7.1 (H) 07/24/2023    Lab Results  Component Value Date   CREATININE 0.73 07/24/2023   BUN 13 07/24/2023   NA 138 07/24/2023   K 4.6 07/24/2023   CL 101 07/24/2023   CO2 25 07/24/2023    Lab Results  Component Value Date   CHOL 132  01/16/2023   HDL 52 01/16/2023   LDLCALC 54 01/16/2023   TRIG 157 (H) 01/16/2023   CHOLHDL 2.5 01/16/2023    Medications Reviewed Today     Reviewed by Gabriel Carina, RPH (Pharmacist) on 08/21/23 at 2211  Med List Status: <None>   Medication Order Taking? Sig Documenting Provider Last Dose Status Informant  atorvastatin (LIPITOR) 10 MG tablet [id number]  TAKE 1 TABLET EVERY DAY Bacigalupo, Marzella Schlein, MD  Active   Blood Glucose Monitoring Suppl (TRUE METRIX METER) w/Device KIT [id number]  USE AS DIRECTED AS NEEDED  FOR  DIABETES  MELLITUS  TYPE 2 Pollak, Adriana M, PA-C  Active   buPROPion (WELLBUTRIN XL) 300 MG 24 hr tablet [id number]  Take 1 tablet (300 mg total) by mouth daily. Erasmo Downer, MD  Active   empagliflozin (JARDIANCE) 25 MG TABS tablet [id number] Yes Take 1 tablet (25 mg total) by mouth daily. Erasmo Downer, MD Taking Active   escitalopram (LEXAPRO) 20 MG tablet [id number]  Take 1 tablet (20 mg total) by mouth daily. Erasmo Downer, MD  Active   gabapentin (NEURONTIN) 300 MG capsule [id number]  Take 1 capsule (300 mg total) by mouth at bedtime. Erasmo Downer, MD  Active   lisinopril (ZESTRIL) 10 MG tablet [id number]  Take 1 tablet (10 mg total) by mouth daily. Erasmo Downer, MD  Active   metFORMIN (GLUCOPHAGE-XR) 500 MG 24 hr tablet [id number]  TAKE 1 TABLET (500 MG TOTAL) IN THE MORNING AND AT BEDTIME. Erasmo Downer, MD  Active   TRUE METRIX BLOOD GLUCOSE TEST test strip [id number]  CHECK FASTING  BLOOD SUGAR ONCE DAILY Jacky Kindle, FNP  Active   TRUEplus Lancets 33G MISC [id number]  Use to check blood glucose level one time daily. Simmons-Robinson, Makiera, MD  Active   ziprasidone (GEODON) 80 MG capsule [id number]  TAKE 1 CAPSULE TWICE DAILY  Patient not taking: Reported on 07/23/2023   Erasmo Downer, MD  Active             Assessment/Plan:   Diabetes: - Currently controlled - Reviewed long term cardiovascular and renal  outcomes of uncontrolled blood sugar - Reviewed goal A1c, goal fasting, and goal 2 hour post prandial glucose - Recommend to continue current regimen  - Medication access resolved, PCP sent refill to dispensing pharmacy for jardiance patient assistance.   Hypertension: - Currently uncontrolled, but very close to goal of <130/80 - Reviewed long term cardiovascular and renal outcomes of uncontrolled blood pressure - Counseled patient on purchase of omron brand upper arm blood pressure cuff, importance of controlled blood pressure to optimize prevention of future heart attacks and strokes - Recommend to check BP on newly increased dose of lisinopril 10mg  daily, document, and provide at future appointments.     Follow Up Plan: 2-4 weeks for 2025 patient assistance renewal  Lynnda Shields, PharmD, BCPS Clinical Pharmacist [facility] Primary [facility]

## 2023-08-21 NOTE — Telephone Encounter (Signed)
-----   Message from Shirlee Latch sent at 08/14/2023  1:20 PM EDT ----- Ok to send 90 s refill with 1 refill of Jardiance as requested. See pharmacy below. ----- Message ----- From: Gabriel Carina, [facility] Sent: 08/12/2023  11:02 AM EDT To: Erasmo Downer, MD  Dr. Beryle Flock,  Can you please send a 90 day refill of jardiance to the [facility] pharmacy in patient's profile to avoid gaps in her patient assistance medication supply?  Thank you, Thurston Hole ----- Message ----- From: Otho Najjar, CPhT Sent: 08/12/2023   9:54 AM EDT To: Gabriel Carina, RPH  Reached out to patient regarding her last refill. She says her last shipment was 06/23/23 (90 day supply). Informed her she should be good until the end of October. Also let her know to keep an eye out for any mail from [facility] regarding re-enrollment OR anything from [place]. In the meantime, could one last 90 day refill be sent to [facility], this will cover her until the end of the year. I've added them to her preferred pharmacies (for [facility]). ----- Message ----- From: Gabriel Carina, [facility] Sent: 08/07/2023  10:24 PM EDT To: Rx Med Assistance Team  Hi, Can you please contact [employer]? I instructed patient to call [employer] and request refill, she did, and then contacted one of our careguides, and told her that the company is requested an application be sent. Are you able to facilitate? Thank you! Thurston Hole

## 2023-08-26 ENCOUNTER — Telehealth: Payer: Self-pay

## 2023-08-26 NOTE — Telephone Encounter (Signed)
I don't know if we are able to correct that. She may have to reach out to the company that did the sleep study. It doesn't change the results and management though

## 2023-08-26 NOTE — Telephone Encounter (Signed)
Faxed information to [employer] today for cpap supplies with office notes and patient demographic/insurance information.

## 2023-08-26 NOTE — Telephone Encounter (Signed)
Copied from CRM [id number]. Topic: General - Other >> Aug 26, 2023  2:17 PM Turkey B wrote: Reason for CRM: pt called in says there is a mistake on her sleepy study info about her weight, says 270 but her weight is 231

## 2023-08-27 NOTE — Telephone Encounter (Signed)
Patient aware.

## 2023-09-05 DIAGNOSIS — F332 Major depressive disorder, recurrent severe without psychotic features: Secondary | ICD-10-CM | POA: Diagnosis not present

## 2023-09-15 DIAGNOSIS — R6889 Other general symptoms and signs: Secondary | ICD-10-CM | POA: Diagnosis not present

## 2023-09-18 ENCOUNTER — Telehealth: Payer: Self-pay | Admitting: Family Medicine

## 2023-09-18 NOTE — Telephone Encounter (Signed)
Patient called about her medication, Jardiance. Patient states that she gets this medication free from the company that makes it and states they send this to her for free and that she needs another prescription. Patient states she is getting low on medication. Patient states PCP needs to call the company that makes this medication, [employer] @ phone # [phone] so she can get another prescription of Jardiance sent to her.  Patients callback # [phone]

## 2023-09-22 DIAGNOSIS — R6889 Other general symptoms and signs: Secondary | ICD-10-CM | POA: Diagnosis not present

## 2023-09-23 MED ORDER — EMPAGLIFLOZIN 25 MG PO TABS: 25.0000 mg | ORAL_TABLET | Freq: Every day | ORAL | 1 refills | Status: DC

## 2023-09-23 NOTE — Telephone Encounter (Signed)
Refill for medication been sent to [facility] pharmacy as requested by [facility] Pt assistance.

## 2023-09-26 ENCOUNTER — Other Ambulatory Visit: Payer: Self-pay | Admitting: Pharmacist

## 2023-09-26 ENCOUNTER — Encounter: Payer: Self-pay | Admitting: Pharmacist

## 2023-09-26 NOTE — Progress Notes (Signed)
09/26/2023 Name: Melinda [name] MRN: [id number] DOB: 01/05/1971  Chief Complaint  Patient presents with   Medication Assistance    Melinda [name] is a 52 y.o. year old female who presented for a telephone visit.   They were referred to the pharmacist by their PCP for assistance in managing medication access.    Subjective:  Care Team: Primary Care Provider: Erasmo Downer, MD   Medication Access/Adherence  Current Pharmacy:  [facility] Delivery - [place], [place] - [address] [place] [address] Phone: [phone] Fax: [phone]  [facility] [address], [place] - [address] [place] [address] Phone: [phone] Fax: [phone]  [facility] - [place], [place] - [address] [place] [address] Phone: [phone] Fax: [phone]   Patient reports affordability concerns with their medications: No  Patient reports access/transportation concerns to their pharmacy: No  Patient reports adherence concerns with their medications:  No     Diabetes:   Current medications: jardiance 25mg  daily, metformin 500mg  BID   Current glucose readings: recalls last checked yesterday morning, reading: 146  Statin therapy: atorvastatin 10 mg daily  Current physical activity: reports stays active around her home   Current medication access support: jardiance via [employer]   Objective:  Lab Results  Component Value Date   HGBA1C 7.1 (H) 07/24/2023    Lab Results  Component Value Date   CREATININE 0.73 07/24/2023   BUN 13 07/24/2023   NA 138 07/24/2023   K 4.6 07/24/2023   CL 101 07/24/2023   CO2 25 07/24/2023    Lab Results  Component Value Date   CHOL 132 01/16/2023   HDL 52 01/16/2023   LDLCALC 54 01/16/2023   TRIG 157 (H) 01/16/2023   CHOLHDL 2.5 01/16/2023    Current Outpatient Medications on File Prior to Visit  Medication Sig Dispense Refill    atorvastatin (LIPITOR) 10 MG tablet TAKE 1 TABLET EVERY DAY 90 tablet 3   Blood Glucose Monitoring Suppl (TRUE METRIX METER) w/Device KIT USE AS DIRECTED AS NEEDED  FOR  DIABETES  MELLITUS  TYPE 2 1 kit 0   buPROPion (WELLBUTRIN XL) 300 MG 24 hr tablet Take 1 tablet (300 mg total) by mouth daily. 90 tablet 0   empagliflozin (JARDIANCE) 25 MG TABS tablet Take 1 tablet (25 mg total) by mouth daily. 90 tablet 1   escitalopram (LEXAPRO) 20 MG tablet Take 1 tablet (20 mg total) by mouth daily. 90 tablet 0   gabapentin (NEURONTIN) 300 MG capsule Take 1 capsule (300 mg total) by mouth at bedtime. 90 capsule 3   lisinopril (ZESTRIL) 10 MG tablet Take 1 tablet (10 mg total) by mouth daily. 90 tablet 1   metFORMIN (GLUCOPHAGE-XR) 500 MG 24 hr tablet TAKE 1 TABLET (500 MG TOTAL) IN THE MORNING AND AT BEDTIME. 180 tablet 3   TRUE METRIX BLOOD GLUCOSE TEST test strip CHECK FASTING BLOOD SUGAR ONCE DAILY 100 strip 3   TRUEplus Lancets 33G MISC Use to check blood glucose level one time daily. 100 each 3   ziprasidone (GEODON) 80 MG capsule TAKE 1 CAPSULE TWICE DAILY (Patient not taking: Reported on 07/23/2023) 180 capsule 0   No current facility-administered medications on file prior to visit.       Assessment/Plan:   Diabetes: - Reviewed long term cardiovascular and renal outcomes of uncontrolled blood sugar - Reviewed goal A1c, goal fasting, and goal 2 hour post  prandial glucose - Reviewed dietary modifications including importance of having regular well-balanced meals and snacks throughout the day, while controlling carbohydrate portion sizes - Will collaborate with PCP and CPhT to aid patient with applying for re-enrollment in patient assistance for Jardiance from [facility] for 2025 - Patient to follow up with [facility] Cares regarding next refill of her Jardiance  Follow Up Plan: Clinical Pharmacist will follow up with patient by telephone on 11/26/2023 at 2:30 PM   Estelle Grumbles, PharmD, [facility] [phone]

## 2023-09-26 NOTE — Patient Instructions (Signed)
Goals Addressed             This Visit's Progress    Pharmacy Goals       Please watch the mail for an envelope from [facility] containing the patient assistance program application. Please complete this application and mail back to [facility] Pharmacy Technician Noreene Larsson [place] along with a copy of your Medicare Part D prescription card and a copy of your proof of income document OR you can bring these documents to the office to have them faxed back to Attention: Pattricia Boss at Fax # [phone]   If you need to call Noreene Larsson, you can reach her at [phone]  If you need to reach out to patient assistance programs regarding refills or to find out the status of your application, you can do so by calling:  [employer] [phone]  Estelle Grumbles, PharmD, [facility] [phone]

## 2023-10-03 DIAGNOSIS — F332 Major depressive disorder, recurrent severe without psychotic features: Secondary | ICD-10-CM | POA: Diagnosis not present

## 2023-10-03 DIAGNOSIS — R6889 Other general symptoms and signs: Secondary | ICD-10-CM | POA: Diagnosis not present

## 2023-10-06 ENCOUNTER — Other Ambulatory Visit: Payer: Self-pay | Admitting: Family Medicine

## 2023-10-07 NOTE — Telephone Encounter (Signed)
Requested medication (s) are due for refill today: yes   Requested medication (s) are on the active medication list: yes   Last refill:  geodon-07/22/23 #180 0 refills, lexapro-07/22/23 #90 0 refills  Future visit scheduled: no   Notes to clinic:  not delegated per protocol . No refills remain. Do you want to refill Rxs?     Requested Prescriptions  Pending Prescriptions Disp Refills   ziprasidone (GEODON) 80 MG capsule [Pharmacy Med Name: Ziprasidone HCl Oral Capsule 80 MG] 180 capsule 3    Sig: TAKE 1 CAPSULE TWICE DAILY     Not Delegated - Psychiatry:  Antipsychotics - Second Generation (Atypical) - ziprasidone Failed - 10/06/2023 11:21 AM      Failed - This refill cannot be delegated      Failed - Lipid Panel in normal range within the last 12 months    Cholesterol, Total  Date Value Ref Range Status  01/16/2023 132 100 - 199 mg/dL Final   LDL Chol Calc (NIH)  Date Value Ref Range Status  01/16/2023 54 0 - 99 mg/dL Final   HDL  Date Value Ref Range Status  01/16/2023 52 >39 mg/dL Final   Triglycerides  Date Value Ref Range Status  01/16/2023 157 (H) 0 - 149 mg/dL Final         Passed - TSH in normal range and within 360 days    TSH  Date Value Ref Range Status  07/24/2023 3.150 0.450 - 4.500 uIU/mL Final         Passed - Completed PHQ-2 or PHQ-9 in the last 360 days      Passed - Last BP in normal range    BP Readings from Last 1 Encounters:  05/12/23 135/80         Passed - Last Heart Rate in normal range    Pulse Readings from Last 1 Encounters:  05/12/23 76         Passed - Valid encounter within last 6 months    Recent Outpatient Visits           2 months ago Hypertension associated with diabetes [facility])   [place] [facility] [place], Marzella Schlein, MD   4 months ago Encounter for screening for COVID-19   [facility] [place], Monico Blitz, DO   8 months ago Viral gastroenteritis   [place] [facility] [place], Marzella Schlein, MD   8 months ago Encounter for annual physical exam   [place] [facility], Marzella Schlein, MD   11 months ago Hypertension associated with diabetes [facility])    [facility] [place], Marzella Schlein, MD              Passed - CBC within normal limits and completed in the last 12 months    WBC  Date Value Ref Range Status  07/24/2023 8.1 3.4 - 10.8 x10E3/uL Final  07/18/2017 7.9 3.6 - 11.0 K/uL Final   RBC  Date Value Ref Range Status  07/24/2023 5.31 (H) 3.77 - 5.28 x10E6/uL Final  07/18/2017 4.57 3.80 - 5.20 MIL/uL Final   Hemoglobin  Date Value Ref Range Status  07/24/2023 13.8 11.1 - 15.9 g/dL Final   Hematocrit  Date Value Ref Range Status  07/24/2023 43.4 34.0 - 46.6 % Final   MCHC  Date Value Ref Range Status  07/24/2023 31.8 31.5 - 35.7 g/dL Final  40/08/2724 36.6 32.0 - 36.0 g/dL Final   [facility]  Date Value Ref Range Status  07/24/2023 26.0 (L) 26.6 - 33.0 pg Final  07/18/2017 27.4 26.0 - 34.0 pg Final   MCV  Date Value Ref Range Status  07/24/2023 82 79 - 97 fL Final  05/08/2013 80 80 - 100 fL Final   No results found for: "PLTCOUNTKUC", "LABPLAT", "POCPLA" RDW  Date Value Ref Range Status  07/24/2023 12.6 11.7 - 15.4 % Final  05/08/2013 14.2 11.5 - 14.5 % Final         Passed - CMP within normal limits and completed in the last 12 months    Albumin  Date Value Ref Range Status  07/24/2023 4.3 3.8 - 4.9 g/dL Final  29/56/2130 3.2 (L) 3.4 - 5.0 g/dL Final   Alkaline Phosphatase  Date Value Ref Range Status  07/24/2023 143 (H) 44 - 121 IU/L Final  05/08/2013 97 50 - 136 Unit/L Final   ALT  Date Value Ref Range Status  07/24/2023 13 0 - 32 IU/L Final   SGPT (ALT)  Date Value Ref Range Status  05/08/2013 14 12 - 78 U/L Final   AST  Date Value Ref Range Status  07/24/2023 14 0 - 40 IU/L Final   SGOT(AST)  Date Value Ref Range Status  05/08/2013 14 (L) 15 - 37  Unit/L Final   BUN  Date Value Ref Range Status  07/24/2023 13 6 - 24 mg/dL Final  86/57/8469 7 7 - 18 mg/dL Final   Calcium  Date Value Ref Range Status  07/24/2023 10.2 8.7 - 10.2 mg/dL Final   Calcium, Total  Date Value Ref Range Status  05/08/2013 8.8 8.5 - 10.1 mg/dL Final   CO2  Date Value Ref Range Status  07/24/2023 25 20 - 29 mmol/L Final   Co2  Date Value Ref Range Status  05/08/2013 28 21 - 32 mmol/L Final   Creatinine  Date Value Ref Range Status  05/08/2013 0.77 0.60 - 1.30 mg/dL Final   Creatinine, Ser  Date Value Ref Range Status  07/24/2023 0.73 0.57 - 1.00 mg/dL Final   Glucose  Date Value Ref Range Status  07/24/2023 93 70 - 99 mg/dL Final  62/95/2841 324 (H) 65 - 99 mg/dL Final   Glucose-Capillary  Date Value Ref Range Status  04/25/2023 147 (H) 70 - 99 mg/dL Final    Comment:    Glucose reference range applies only to samples taken after fasting for at least 8 hours.   Potassium  Date Value Ref Range Status  07/24/2023 4.6 3.5 - 5.2 mmol/L Final  05/08/2013 3.8 3.5 - 5.1 mmol/L Final   Sodium  Date Value Ref Range Status  07/24/2023 138 134 - 144 mmol/L Final  05/08/2013 139 136 - 145 mmol/L Final   Bilirubin,Total  Date Value Ref Range Status  05/08/2013 0.3 0.2 - 1.0 mg/dL Final   Bilirubin Total  Date Value Ref Range Status  07/24/2023 0.3 0.0 - 1.2 mg/dL Final   Bilirubin, Direct  Date Value Ref Range Status  03/21/2022 <0.10 0.00 - 0.40 mg/dL Final   Protein  Date Value Ref Range Status  05/08/2013 Negative NEGATIVE Final   Total Protein  Date Value Ref Range Status  07/24/2023 7.0 6.0 - 8.5 g/dL Final  40/08/2724 6.7 6.4 - 8.2 g/dL Final   EGFR (African American)  Date Value Ref Range Status  05/08/2013 >60  Final   GFR calc Af Amer  Date Value Ref Range Status  05/16/2020 90 >59 mL/min/1.73 Final  Comment:    **[facility] currently reports eGFR in compliance with the current**   recommendations of the [employer]. [facility] will   update reporting as new guidelines are published from the NKF-ASN   Task force.    eGFR  Date Value Ref Range Status  07/24/2023 98 >59 mL/min/1.73 Final   EGFR (Non-African Amer.)  Date Value Ref Range Status  05/08/2013 >60  Final    Comment:    eGFR values <82mL/min/1.73 m2 may be an indication of chronic kidney disease (CKD). Calculated eGFR is useful in patients with stable renal function. The eGFR calculation will not be reliable in acutely ill patients when serum creatinine is changing rapidly. It is not useful in  patients on dialysis. The eGFR calculation may not be applicable to patients at the low and high extremes of body sizes, pregnant women, and vegetarians.    GFR calc non Af Amer  Date Value Ref Range Status  05/16/2020 78 >59 mL/min/1.73 Final          escitalopram (LEXAPRO) 20 MG tablet [Pharmacy Med Name: Escitalopram Oxalate Oral Tablet 20 MG] 90 tablet 3    Sig: TAKE 1 TABLET EVERY DAY     Psychiatry:  Antidepressants - SSRI Passed - 10/06/2023 11:21 AM      Passed - Completed PHQ-2 or PHQ-9 in the last 360 days      Passed - Valid encounter within last 6 months    Recent Outpatient Visits           2 months ago Hypertension associated with diabetes [facility])   [place] [facility] [place], Marzella Schlein, MD   4 months ago Encounter for screening for COVID-19   [facility] [place], Monico Blitz, DO   8 months ago Viral gastroenteritis   [place] [facility] [place], Marzella Schlein, MD   8 months ago Encounter for annual physical exam   [place] [facility] [place], Marzella Schlein, MD   11 months ago Hypertension associated with diabetes [facility])   [place] [facility], Marzella Schlein, MD

## 2023-10-16 ENCOUNTER — Other Ambulatory Visit: Payer: Self-pay | Admitting: Pharmacy Technician

## 2023-10-16 DIAGNOSIS — Z5986 Financial insecurity: Secondary | ICD-10-CM

## 2023-10-16 NOTE — Progress Notes (Signed)
Pharmacy Medication Assistance Program Note    10/16/2023  Patient ID: Melinda [name], female   DOB: 02/14/1971, 52 y.o.   MRN: [id number]     10/16/2023  Outreach Medication One  Initial Outreach Date (Medication One) 10/15/2023  Manufacturer Medication One [employer]  [employer] Drugs Jardiance  Dose of Jardiance 25mg   Type of Radiographer, therapeutic Assistance  Date Application Sent to Patient 10/15/2023  Application Items Requested Application;Proof of Income;Proof of Out of Pocket Spend;Other  Date Application Sent to Prescriber 10/22/2023  Name of Prescriber [facility]       Signature  Pattricia Boss, CPhT [facility]  Office: [phone] Fax: [phone] Email: [email or web address]

## 2023-10-31 DIAGNOSIS — F332 Major depressive disorder, recurrent severe without psychotic features: Secondary | ICD-10-CM | POA: Diagnosis not present

## 2023-10-31 DIAGNOSIS — R6889 Other general symptoms and signs: Secondary | ICD-10-CM | POA: Diagnosis not present

## 2023-10-31 DIAGNOSIS — G4730 Sleep apnea, unspecified: Secondary | ICD-10-CM | POA: Diagnosis not present

## 2023-10-31 DIAGNOSIS — R519 Headache, unspecified: Secondary | ICD-10-CM | POA: Diagnosis not present

## 2023-11-15 DIAGNOSIS — G4733 Obstructive sleep apnea (adult) (pediatric): Secondary | ICD-10-CM | POA: Diagnosis not present

## 2023-11-18 DIAGNOSIS — G4733 Obstructive sleep apnea (adult) (pediatric): Secondary | ICD-10-CM | POA: Diagnosis not present

## 2023-11-19 ENCOUNTER — Other Ambulatory Visit: Payer: Self-pay | Admitting: Family Medicine

## 2023-11-26 ENCOUNTER — Other Ambulatory Visit: Payer: Self-pay | Admitting: Pharmacist

## 2023-11-26 DIAGNOSIS — E119 Type 2 diabetes mellitus without complications: Secondary | ICD-10-CM

## 2023-11-26 NOTE — Patient Instructions (Addendum)
 Goals Addressed             This Visit's Progress    Pharmacy Goals       Please complete the patient assistance application and mail back to [facility] Pharmacy Technician Aleda Hurl Simcox along with a copy of your Medicare Part D prescription card and a copy of your proof of income document OR you can bring these documents to the office to have them faxed back to Attention: Jill Simcox at Fax # [phone]   If you need to call Aleda Hurl, you can reach her at [phone]  If you need to reach out to patient assistance programs regarding refills or to find out the status of your application, you can do so by calling:  [employer] [phone]  Arthur Lash, PharmD, [facility] [phone]

## 2023-11-26 NOTE — Progress Notes (Signed)
 11/26/2023 Name: Melinda [name] MRN: [id number] DOB: [birth year]  Chief Complaint  Patient presents with   Medication Assistance    Melinda [name] is a 53 y.o. year old female who presented for a telephone visit.   They were referred to the pharmacist by their PCP for assistance in managing medication access.      Subjective:   Care Team: Primary Care Provider: Mazie Speed, MD   Medication Access/Adherence  Current Pharmacy:  [facility] - [place], [place] - [address] [place] [address] Phone: [phone] Fax: [phone]  [facility] [address], [place] - [address] [place] [address] Phone: [phone] Fax: [phone]  [facility] [place], [place] - [address] [place] [address] Phone: [phone] Fax: [phone]   Patient reports affordability concerns with their medications: No  Patient reports access/transportation concerns to their pharmacy: No  Patient reports adherence concerns with their medications:  No      Diabetes:   Current medications: jardiance  25mg  daily, metformin  ER 500mg  twice daily   Current glucose readings: recalls recent morning fasting readings range 120-130   Statin therapy: atorvastatin  10 mg daily   Current physical activity: reports limited to movement around her home   Current medication access support: collaborate with PCP and CPhT to aid patient with applying for re-enrollment in patient assistance for Jardiance  from [facility] for 2025  Reports has ~1 month supply remaining Reports has not yet completed application for re-enrollment, but is able to locate the document during our call. States will fill this out and mail back to CPhT, along with her proof of income document, today or tomorrow   Objective:  Lab Results  Component Value Date   HGBA1C 7.1 (H) 07/24/2023    Lab Results  Component Value  Date   CREATININE 0.73 07/24/2023   BUN 13 07/24/2023   NA 138 07/24/2023   K 4.6 07/24/2023   CL 101 07/24/2023   CO2 25 07/24/2023    Lab Results  Component Value Date   CHOL 132 01/16/2023   HDL 52 01/16/2023   LDLCALC 54 01/16/2023   TRIG 157 (H) 01/16/2023   CHOLHDL 2.5 01/16/2023    Medications Reviewed Today     Reviewed by Ardis Becton, RPH-CPP (Pharmacist) on 11/26/23 at 1445  Med List Status: <None>   Medication Order Taking? Sig Documenting Provider Last Dose Status Informant  atorvastatin  (LIPITOR) 10 MG tablet [id number]  TAKE 1 TABLET EVERY DAY Bacigalupo, Angela M, MD  Active   Blood Glucose Monitoring Suppl (TRUE METRIX METER) w/Device KIT [id number]  USE AS DIRECTED AS NEEDED  FOR  DIABETES  MELLITUS  TYPE 2 Elihu Grumet M, PA-C  Active   buPROPion  (WELLBUTRIN  XL) 300 MG 24 hr tablet [id number]  TAKE 1 TABLET EVERY DAY Bacigalupo, Angela M, MD  Active   empagliflozin  (JARDIANCE ) 25 MG TABS tablet [id number] Yes Take 1 tablet (25 mg total) by mouth daily. Mazie Speed, MD Taking Active   escitalopram  (LEXAPRO ) 20 MG tablet [id number]  TAKE 1 TABLET EVERY DAY Pardue, Sarah N, DO  Active   gabapentin  (NEURONTIN ) 300 MG capsule [id number]  Take 1 capsule (300 mg total) by mouth at bedtime. Mazie Speed, MD  Active   lisinopril  (ZESTRIL ) 10 MG tablet [id number]  Take 1 tablet (10 mg total) by mouth daily. Bacigalupo, Angela M, MD  Active   metFORMIN  (GLUCOPHAGE -XR) 500  MG 24 hr tablet [id number] Yes TAKE 1 TABLET (500 MG TOTAL) IN THE MORNING AND AT BEDTIME. Mazie Speed, MD Taking Active   TRUE METRIX BLOOD GLUCOSE TEST test strip [id number]  CHECK FASTING BLOOD SUGAR ONCE DAILY Normie Becton, FNP  Active   TRUEplus Lancets 33G MISC [id number]  Use to check blood glucose level one time daily. Simmons-Robinson, Makiera, MD  Active   ziprasidone  (GEODON ) 80 MG capsule [id number]  TAKE 1 CAPSULE TWICE DAILY Pardue, Asencion Blacksmith, DO  Active                Assessment/Plan:   Will send referral message to Social Worker as patient shares that she has chronic severe depression and no longer has a Therapist, sports. [facility] Reports just had to stop seeing her psychiatrist and receiving ECT treatment in January as [employer] no longer providing transportation. States that she is taking her medication consistently and doing okay mentally, but would like to get established again with psychiatry team  Collaborate with LCSW today  Diabetes: - Reviewed long term cardiovascular and renal outcomes of uncontrolled blood sugar - Reviewed goal A1c, goal fasting, and goal 2 hour post prandial glucose - Reviewed dietary modifications including importance of having regular well-balanced meals and snacks throughout the day, while controlling carbohydrate portion sizes Encourage patient to work on reducing sugary beverage consumption (soda and sweet tea), drinking water in place Encourage to work on increasing portion sizes of non-starchy vegetables - Collaborating with PCP and CPhT to aid patient with applying for re-enrollment in patient assistance for Jardiance  from [facility] for 2025    Follow Up Plan: Clinical Pharmacist will follow up with patient by telephone on 12/24/2023 at 2:30 PM    Arthur Lash, PharmD, [facility] [phone]

## 2023-11-27 ENCOUNTER — Encounter: Payer: Medicare PPO | Admitting: *Deleted

## 2023-12-02 ENCOUNTER — Ambulatory Visit: Payer: Self-pay | Admitting: *Deleted

## 2023-12-02 NOTE — Patient Instructions (Signed)
Visit Information  Thank you for taking time to visit with me today. Please don't hesitate to contact me if I can be of assistance to you.   Following are the goals we discussed today:   Goals Addressed             This Visit's Progress    care coordination activities       Activities and task to complete in order to accomplish goals.   EMOTIONAL / MENTAL HEALTH SUPPORT Keep all upcoming appointment discussed today Self Support options  (please continue to utilizing coping strategies for depression dicussed today, continue to accept support from family, CSW to assist with coordinating ongoing mental health support as needed)          If you are experiencing a Mental Health or Behavioral Health Crisis or need someone to talk to, please call the Suicide and Crisis Lifeline: 988   Patient verbalizes understanding of instructions and care plan provided today and agrees to view in MyChart. Active MyChart status and patient understanding of how to access instructions and care plan via MyChart confirmed with patient.     No further follow up required: patient to contact this Child psychotherapist with any community/mental health needs  [employer], LCSW [place]  [facility] Licensed Clinical Social Geologist, engineering Dial: [phone]

## 2023-12-02 NOTE — Patient Outreach (Signed)
  Care Coordination   Initial Visit Note   12/02/2023 Name: Melinda [name] MRN: [id number] DOB: [birth year]  Melinda [name] is a 53 y.o. year old female who sees Bacigalupo, Marzella Schlein, MD for primary care. I spoke with  Melinda [name] by phone today.  What matters to the patients health and wellness today?  Patient contacted to assess for transportation resources and coordination of ongoing mental health follow up. Patient states that she stopped ECT due to transportation and costs. Patient states that she is not interested in continuing treatments due to improvement in mood. Confirms that her son is supportive and transports her to local medical appointments. Patient declines ongoing mental health follow up at this time. CSW will contact this social worker with any additional community or mental health needs   Goals Addressed             This Visit's Progress    care coordination activities       Activities and task to complete in order to accomplish goals.   EMOTIONAL / MENTAL HEALTH SUPPORT Keep all upcoming appointment discussed today Self Support options  (please continue to utilizing coping strategies for depression dicussed today, continue to accept support from family, CSW to assist with coordinating ongoing mental health support as needed)         SDOH assessments and interventions completed:  Yes  SDOH Interventions Today    Flowsheet Row Most Recent Value  SDOH Interventions   Food Insecurity Interventions Intervention Not Indicated, Other (Comment)  [receives $23.00 per month in food stamps, receives a spending card from [employer] $45.00 per month]  Housing Interventions Intervention Not Indicated  Transportation Interventions Intervention Not Indicated, Patient Resources (Friends/Family)  [son provided transport to local appts-can no longer go to [facility] for ECT due to transportation concerns and inability to pay co-pay]  Utilities Interventions Intervention Not  Indicated  Social Connections Interventions Intervention Not Indicated        Care Coordination Interventions:  Yes, provided  Interventions Today    Flowsheet Row Most Recent Value  Chronic Disease   Chronic disease during today's visit Other  [depression]  General Interventions   General Interventions Discussed/Reviewed General Interventions Discussed, Leisure centre manager of current mental health and transportation needs.]  Mental Health Interventions   Mental Health Discussed/Reviewed Mental Health Discussed, Coping Strategies, Depression  [Mental Health needs addressed-patient reports mood is stable, depression symptoms have improved. No longer requesting additional ECT due to improvement-declines need for local ECT or ongoing mental health follow up-]  Safety Interventions   Safety Discussed/Reviewed Safety Discussed  [patient states having no thoughts of self harm, agrees to contact [phone] in the event of a mental health crisis-confirms having positive support from son and a strong spiritual life]       Follow up plan: No further intervention required.   Encounter Outcome:  Patient Visit Completed

## 2023-12-05 ENCOUNTER — Ambulatory Visit (INDEPENDENT_AMBULATORY_CARE_PROVIDER_SITE_OTHER): Payer: Medicare PPO | Admitting: Family Medicine

## 2023-12-05 ENCOUNTER — Encounter: Payer: Self-pay | Admitting: Family Medicine

## 2023-12-05 VITALS — BP 108/77 | HR 77 | Ht 62.0 in | Wt 230.0 lb

## 2023-12-05 DIAGNOSIS — M79644 Pain in right finger(s): Secondary | ICD-10-CM | POA: Diagnosis not present

## 2023-12-05 DIAGNOSIS — R252 Cramp and spasm: Secondary | ICD-10-CM

## 2023-12-05 DIAGNOSIS — Z23 Encounter for immunization: Secondary | ICD-10-CM | POA: Diagnosis not present

## 2023-12-05 DIAGNOSIS — N951 Menopausal and female climacteric states: Secondary | ICD-10-CM | POA: Diagnosis not present

## 2023-12-05 DIAGNOSIS — G4733 Obstructive sleep apnea (adult) (pediatric): Secondary | ICD-10-CM | POA: Diagnosis not present

## 2023-12-05 NOTE — Assessment & Plan Note (Signed)
Using auto-adjusting CPAP for 90 days following a sleep study on August 01, 2023, which showed an AHI of 7.3. Reports inconsistent use due to a recent cold and difficulty adhering to the recommended usage of at least 4 hours per night. Does not feel more rested when using the CPAP, but thinks this is related to pain and needing a new mattress. Emphasized the importance of consistent use to ensure Medicare coverage. Explained that usage during naps and daytime sleep also counts towards the 4-hour average.   - Document CPAP usage for Medicare compliance   - Encourage consistent CPAP use, including during naps and daytime sleep

## 2023-12-05 NOTE — Progress Notes (Signed)
Established patient visit   Patient: Melinda [name]   DOB: July 09, 1971   53 y.o. Female  MRN: [id number] Visit Date: 12/05/2023  Today's healthcare provider: Shirlee Latch, MD   No chief complaint on file.  Subjective    HPI HPI   Pt stated--would like to talk about CPAP machine Flu and shingle shot. Last edited by Shelly Bombard, CMA on 12/05/2023 10:20 AM.       Discussed the use of AI scribe software for clinical note transcription with the patient, who gave verbal consent to proceed.  History of Present Illness   The patient, with a history of sleep apnea and menopause, presents for a routine check-up and to discuss concerns about her CPAP machine usage. She reports inconsistent usage of the CPAP machine due to a recent cold and her irregular sleep schedule. She also mentions that she often falls asleep before putting on the CPAP machine and that she sleeps in sections, not always for a full four hours at a time. Despite using the CPAP machine, the patient does not report feeling more rested. She also mentions experiencing hot flashes, which she attributes to menopause, and pain in her finger, which she believes may be due to arthritis.        Medications: Outpatient Medications Prior to Visit  Medication Sig   atorvastatin (LIPITOR) 10 MG tablet TAKE 1 TABLET EVERY DAY   Blood Glucose Monitoring Suppl (TRUE METRIX METER) w/Device KIT USE AS DIRECTED AS NEEDED  FOR  DIABETES  MELLITUS  TYPE 2   buPROPion (WELLBUTRIN XL) 300 MG 24 hr tablet TAKE 1 TABLET EVERY DAY   empagliflozin (JARDIANCE) 25 MG TABS tablet Take 1 tablet (25 mg total) by mouth daily.   escitalopram (LEXAPRO) 20 MG tablet TAKE 1 TABLET EVERY DAY   gabapentin (NEURONTIN) 300 MG capsule Take 1 capsule (300 mg total) by mouth at bedtime.   lisinopril (ZESTRIL) 10 MG tablet Take 1 tablet (10 mg total) by mouth daily.   metFORMIN (GLUCOPHAGE-XR) 500 MG 24 hr tablet TAKE 1 TABLET (500 MG TOTAL) IN THE  MORNING AND AT BEDTIME.   TRUE METRIX BLOOD GLUCOSE TEST test strip CHECK FASTING BLOOD SUGAR ONCE DAILY   TRUEplus Lancets 33G MISC Use to check blood glucose level one time daily.   ziprasidone (GEODON) 80 MG capsule TAKE 1 CAPSULE TWICE DAILY   No facility-administered medications prior to visit.    Review of Systems     Objective    BP 108/77 (BP Location: Left Arm, Patient Position: Sitting, Cuff Size: Large)   Pulse 77   Ht 5\' 2"  (1.575 m)   Wt 230 lb (104.3 kg)   LMP  (LMP Unknown) Comment: pt states she has not had period in the past year  SpO2 97%   BMI 42.07 kg/m    Physical Exam Vitals reviewed.  Constitutional:      General: She is not in acute distress.    Appearance: Normal appearance. She is well-developed. She is not diaphoretic.  HENT:     Head: Normocephalic and atraumatic.  Eyes:     General: No scleral icterus.    Conjunctiva/sclera: Conjunctivae normal.  Neck:     Thyroid: No thyromegaly.  Cardiovascular:     Rate and Rhythm: Normal rate and regular rhythm.     Heart sounds: Normal heart sounds. No murmur heard. Pulmonary:     Effort: Pulmonary effort is normal. No respiratory distress.  Breath sounds: Normal breath sounds. No wheezing, rhonchi or rales.  Musculoskeletal:     Cervical back: Neck supple.     Right lower leg: No edema.     Left lower leg: No edema.  Lymphadenopathy:     Cervical: No cervical adenopathy.  Skin:    General: Skin is warm and dry.     Findings: No rash.  Neurological:     Mental Status: She is alert and oriented to person, place, and time. Mental status is at baseline.  Psychiatric:        Mood and Affect: Mood normal.        Behavior: Behavior normal.      No results found for any visits on 12/05/23.  Assessment & Plan     Problem List Items Addressed This Visit       Cardiovascular and Mediastinum   Hot flashes due to menopause     Respiratory   Apnea, sleep - Primary   Using auto-adjusting  CPAP for 90 days following a sleep study on August 01, 2023, which showed an AHI of 7.3. Reports inconsistent use due to a recent cold and difficulty adhering to the recommended usage of at least 4 hours per night. Does not feel more rested when using the CPAP, but thinks this is related to pain and needing a new mattress. Emphasized the importance of consistent use to ensure Medicare coverage. Explained that usage during naps and daytime sleep also counts towards the 4-hour average.   - Document CPAP usage for Medicare compliance   - Encourage consistent CPAP use, including during naps and daytime sleep        Other Visit Diagnoses       Encounter for immunization       Relevant Orders   Flu vaccine trivalent PF, 6mos and older(Flulaval,Afluria,Fluarix,Fluzone) (Completed)     Pain of finger of right hand         Hand cramp              Menopausal Symptoms   Experiencing increased frequency of hot flashes, particularly during activities like washing dishes. Currently on gabapentin and Lexapro, which can help with hot flashes, but symptoms persist. Explained that these medications take time to show improvement and most people find they get better over time.   - Continue current medications (gabapentin and Lexapro)   - Monitor symptoms and consider additional interventions if needed    Finger Pain and Cramping   Reports intermittent cramping and pain in her finger, likely related to dehydration. Also has a blood blister and mild swelling from biting her cuticles. Advised to increase water intake and explained that dehydration can cause cramps.   - Encourage increased water intake   - Soak finger in warm water   - Apply Neosporin to the affected area    Diabetes Mellitus   Inquired about needing a new doctor due to confusion with insurance coverage. Clarified that the clinic is still in-network with [employer] despite recent changes in the clinic's tax ID number. Due for a physical and lab  checks in March.   - Schedule physical and lab checks for March    General Health Maintenance   Requested a flu shot and a shingles shot. Informed that Medicare covers the shingles shot at the pharmacy, not in the office, to avoid billing issues.   - Administer flu shot   - Provide reminder for shingles shot at the pharmacy    Follow-up   -  Schedule physical and lab checks for March.       Return in about 2 months (around 02/02/2024) for CPE.       Shirlee Latch, MD  [facility] [phone] (phone) [phone] (fax)  [facility]

## 2023-12-11 ENCOUNTER — Telehealth: Payer: Self-pay | Admitting: Pharmacy Technician

## 2023-12-11 DIAGNOSIS — Z5986 Financial insecurity: Secondary | ICD-10-CM

## 2023-12-11 NOTE — Progress Notes (Signed)
Pharmacy Medication Assistance Program Note    12/11/2023  Patient ID: Melinda [name], female   DOB: 1971-05-03, 53 y.o.   MRN: [id number]     10/16/2023 12/11/2023  Outreach Medication One  Initial Outreach Date (Medication One) 10/15/2023   Manufacturer Medication One [employer]   [employer] Jardiance   Dose of Jardiance 25mg    Type of Radiographer, therapeutic Assistance   Date Application Sent to Patient 10/15/2023   Application Items Requested Application;Proof of Income;Proof of Out of Pocket Spend;Other   Date Application Sent to Prescriber 10/22/2023   Name of Prescriber Shirlee Latch   Date Application Received From Patient  12/10/2023  Application Items Received From Patient  Application;Proof of Income;Other  Date Application Received From Provider  10/27/2023  Date Application Submitted to Manufacturer  12/10/2023  Method Application Sent to Manufacturer  Fax     Signature Kristopher Glee [facility]  Office: [phone] Fax: [phone] Email: [email or web address]

## 2023-12-15 ENCOUNTER — Other Ambulatory Visit: Payer: Self-pay | Admitting: Family Medicine

## 2023-12-15 ENCOUNTER — Telehealth: Payer: Self-pay | Admitting: Pharmacy Technician

## 2023-12-15 DIAGNOSIS — I10 Essential (primary) hypertension: Secondary | ICD-10-CM

## 2023-12-15 DIAGNOSIS — Z5986 Financial insecurity: Secondary | ICD-10-CM

## 2023-12-15 NOTE — Progress Notes (Signed)
Pharmacy Medication Assistance Program Note    12/15/2023  Patient ID: Melinda [name], female   DOB: [birth year], 53 y.o.   MRN: [id number]     10/16/2023 12/11/2023 12/15/2023  Outreach Medication One  Initial Outreach Date (Medication One) 10/15/2023    Manufacturer Medication One [employer]    [employer] Jardiance    Dose of Jardiance 25mg     Type of Radiographer, therapeutic Assistance    Date Application Sent to Patient 10/15/2023    Application Items Requested Application;Proof of Income;Proof of Out of Pocket Spend;Other    Date Application Sent to Prescriber 10/22/2023    Name of Prescriber Shirlee Latch    Date Application Received From Patient  12/10/2023   Application Items Received From Patient  Application;Proof of Income;Other   Date Application Received From Provider  10/27/2023   Date Application Submitted to Manufacturer  12/10/2023   Method Application Sent to Manufacturer  Fax   Patient Assistance Determination   Approved  Approval Start Date   12/15/2023  Approval End Date   11/10/2024  Patient Notification Method   Telephone Call  Telephone Call Outcome   Successful    Care coordination call placed to BI in regard to Jardiance application. Per IVR system, patient is APPROVED 12/15/23-11/10/24. Initial shipment will process automatically and be delivered to the patient's home. Subsequent refills will have to be phoned into BI by the patient. The patient can call BI at [phone].  Successful outreach to patient. HIPAA verified. Patient was informed of her approval, when to expect the medication and refill process. Patient verbalized understanding.  Pattricia Boss, CPhT [place]  Office: [phone] Fax: [phone] Email: [email or web address]

## 2023-12-16 DIAGNOSIS — G4733 Obstructive sleep apnea (adult) (pediatric): Secondary | ICD-10-CM | POA: Diagnosis not present

## 2023-12-16 NOTE — Telephone Encounter (Signed)
 Requested Prescriptions  Pending Prescriptions Disp Refills   lisinopril  (ZESTRIL ) 10 MG tablet [Pharmacy Med Name: Lisinopril  Oral Tablet 10 MG] 90 tablet 0    Sig: TAKE 1 TABLET EVERY DAY     Cardiovascular:  ACE Inhibitors Passed - 12/16/2023 10:25 AM      Passed - Cr in normal range and within 180 days    Creatinine  Date Value Ref Range Status  05/08/2013 0.77 0.60 - 1.30 mg/dL Final   Creatinine, Ser  Date Value Ref Range Status  07/24/2023 0.73 0.57 - 1.00 mg/dL Final         Passed - K in normal range and within 180 days    Potassium  Date Value Ref Range Status  07/24/2023 4.6 3.5 - 5.2 mmol/L Final  05/08/2013 3.8 3.5 - 5.1 mmol/L Final         Passed - Patient is not pregnant      Passed - Last BP in normal range    BP Readings from Last 1 Encounters:  12/05/23 108/77         Passed - Valid encounter within last 6 months    Recent Outpatient Visits           1 week ago Obstructive sleep apnea syndrome   [place] [facility] Myrla Jon HERO, MD   4 months ago Hypertension associated with diabetes [facility])   [facility] [place], Jon HERO, MD   7 months ago Encounter for screening for COVID-19   [facility] [place], Lauraine SAILOR, DO   10 months ago Viral gastroenteritis   [place] [facility] [place], Jon HERO, MD   11 months ago Encounter for annual physical exam   [facility] [place], Jon HERO, MD       Future Appointments             In 1 month [place], Jon HERO, MD [facility]

## 2023-12-17 ENCOUNTER — Ambulatory Visit: Payer: Medicare PPO | Admitting: Emergency Medicine

## 2023-12-17 VITALS — Ht 62.0 in | Wt 230.0 lb

## 2023-12-17 DIAGNOSIS — Z1231 Encounter for screening mammogram for malignant neoplasm of breast: Secondary | ICD-10-CM

## 2023-12-17 DIAGNOSIS — Z0000 Encounter for general adult medical examination without abnormal findings: Secondary | ICD-10-CM

## 2023-12-17 NOTE — Progress Notes (Signed)
 Subjective:   Melinda [name] is a 53 y.o. female who presents for Medicare Annual (Subsequent) preventive examination.  This patient declined Interactive audio and acupuncturist. Therefore the visit was completed with audio only.   Visit Complete: Virtual I connected with  Raquelle M Gupta on 12/17/23 by a audio enabled telemedicine application and verified that I am speaking with the correct person using two identifiers.  Patient Location: Home  Provider Location: Home Office  I discussed the limitations of evaluation and management by telemedicine. The patient expressed understanding and agreed to proceed.  Vital Signs: Because this visit was a virtual/telehealth visit, some criteria may be missing or patient reported. Any vitals not documented were not able to be obtained and vitals that have been documented are patient reported.   Cardiac Risk Factors include: diabetes mellitus;hypertension;dyslipidemia;obesity (BMI >30kg/m2);Other (see comment), Risk factor comments: OSA (cpap)     Objective:    Today's Vitals   12/17/23 1403  Weight: 230 lb (104.3 kg)  Height: 5' 2 (1.575 m)  PainSc: 5    Body mass index is 42.07 kg/m.     12/17/2023    2:15 PM 12/10/2022    8:23 AM 12/05/2021    9:50 AM 11/28/2020    8:40 AM 12/01/2018    8:00 AM  Advanced Directives  Does Patient Have a Medical Advance Directive? No No No No No  Would patient like information on creating a medical advance directive? Yes (MAU/Ambulatory/Procedural Areas - Information given) No - Patient declined;Yes (Inpatient - patient defers creating a medical advance directive at this time - Information given) No - Patient declined No - Patient declined No - Patient declined    Current Medications (verified) Outpatient Encounter Medications as of 12/17/2023  Medication Sig   atorvastatin  (LIPITOR) 10 MG tablet TAKE 1 TABLET EVERY DAY   Blood Glucose Monitoring Suppl (TRUE METRIX METER) w/Device KIT  USE AS DIRECTED AS NEEDED  FOR  DIABETES  MELLITUS  TYPE 2   buPROPion  (WELLBUTRIN  XL) 300 MG 24 hr tablet TAKE 1 TABLET EVERY DAY   empagliflozin  (JARDIANCE ) 25 MG TABS tablet Take 1 tablet (25 mg total) by mouth daily.   escitalopram  (LEXAPRO ) 20 MG tablet TAKE 1 TABLET EVERY DAY   gabapentin  (NEURONTIN ) 300 MG capsule Take 1 capsule (300 mg total) by mouth at bedtime.   lisinopril  (ZESTRIL ) 10 MG tablet TAKE 1 TABLET EVERY DAY   metFORMIN  (GLUCOPHAGE -XR) 500 MG 24 hr tablet TAKE 1 TABLET (500 MG TOTAL) IN THE MORNING AND AT BEDTIME.   TRUE METRIX BLOOD GLUCOSE TEST test strip CHECK FASTING BLOOD SUGAR ONCE DAILY   TRUEplus Lancets 33G MISC Use to check blood glucose level one time daily.   ziprasidone  (GEODON ) 80 MG capsule TAKE 1 CAPSULE TWICE DAILY   No facility-administered encounter medications on file as of 12/17/2023.    Allergies (verified) Prednisone   History: Past Medical History:  Diagnosis Date   Depression    Diabetes mellitus without complication (HCC)    Diabetic peripheral neuropathy (HCC) 03/07/15   Diabetic peripheral neuropathy (HCC) 03/07/15   Diabetic peripheral neuropathy (HCC) 03/07/15   GERD (gastroesophageal reflux disease)    Hypercholesterolemia 03/07/15   Hypertension    Obesity 03/07/15   Personality disorder (HCC) 03/07/15   Sinus tachycardia 03/07/15   history of   Suicidal thoughts 03/07/15   Past Surgical History:  Procedure Laterality Date   COLONOSCOPY WITH PROPOFOL  N/A 12/01/2018   Procedure: COLONOSCOPY WITH PROPOFOL ;  Surgeon: Therisa,  Ruel, MD;  Location: [facility] END[facility]COPY;  Service: Gastroenterology;  Laterality: N/A;   electroconvulsion therapy  03/07/15   Family History  Problem Relation Age of Onset   Diabetes Mother    Hypertension Father    Breast cancer Neg Hx    Colon cancer Neg Hx    Social History   Socioeconomic History   Marital status: Single    Spouse name: Not on file   Number of children: 1   Years of education: diplomatic services operational officer  degree   Highest education level: Associate degree: occupational, scientist, product/process development, or vocational program  Occupational History   Occupation: Disabled  Tobacco Use   Smoking status: Never   Smokeless tobacco: Never  Vaping Use   Vaping status: Never Used  Substance and Sexual Activity   Alcohol  use: No   Drug use: No   Sexual activity: Not Currently    Birth control/protection: Abstinence  Other Topics Concern   Not on file  Social History Narrative   Not on file   Social Drivers of Health   Financial Resource Strain: Medium Risk (12/17/2023)   Overall Financial Resource Strain (CARDIA)    Difficulty of Paying Living Expenses: Somewhat hard  Food Insecurity: No Food Insecurity (12/17/2023)   Hunger Vital Sign    Worried About Running Out of Food in the Last Year: Never true    Ran Out of Food in the Last Year: Never true  Recent Concern: Food Insecurity - Food Insecurity Present (12/02/2023)   Hunger Vital Sign    Worried About Running Out of Food in the Last Year: Never true    Ran Out of Food in the Last Year: Sometimes true  Transportation Needs: Unmet Transportation Needs (12/17/2023)   PRAPARE - Transportation    Lack of Transportation (Medical): Yes    Lack of Transportation (Non-Medical): Yes  Physical Activity: Inactive (12/17/2023)   Exercise Vital Sign    Days of Exercise per Week: 0 days    Minutes of Exercise per Session: 0 min  Stress: No Stress Concern Present (12/17/2023)   [employer] of Occupational Health - Occupational Stress Questionnaire    Feeling of Stress : Not at all  Social Connections: Socially Isolated (12/17/2023)   Social Connection and Isolation Panel [NHANES]    Frequency of Communication with Friends and Family: Once a week    Frequency of Social Gatherings with Friends and Family: Once a week    Attends Religious Services: Never    Database Administrator or Organizations: No    Attends Engineer, Structural: Never    Marital Status: Never  married    Tobacco Counseling Counseling given: Not Answered   Clinical Intake:  Pre-visit preparation completed: Yes  Pain : 0-10 Pain Score: 5  Pain Type: Chronic pain Pain Location: Back Pain Descriptors / Indicators: Aching     BMI - recorded: 42.07 Nutritional Status: BMI > 30  Obese Nutritional Risks: None Diabetes: Yes CBG done?: No (FBS 100 per patient) Did pt. bring in CBG monitor from home?: No  How often do you need to have someone help you when you read instructions, pamphlets, or other written materials from your doctor or pharmacy?: 1 - Never  Interpreter Needed?: No  Information entered by :: Vina Ned, CMA   Activities of Daily Living    12/17/2023    2:06 PM 01/24/2023   10:21 AM  In your present state of health, do you have any difficulty performing the following activities:  Hearing?  0 0  Vision? 0 0  Difficulty concentrating or making decisions? 0 0  Walking or climbing stairs? 1 1  Comment joint pain   Dressing or bathing? 0 0  Doing errands, shopping? 0 0  Preparing Food and eating ? N   Using the Toilet? N   In the past six months, have you accidently leaked urine? N   Do you have problems with loss of bowel control? N   Managing your Medications? N   Managing your Finances? N   Housekeeping or managing your Housekeeping? N     Patient Care Team: Myrla Jon HERO, MD as PCP - General (Family Medicine) Clapacs, Norleen DASEN, MD (Inactive) (Psychiatry) Alana Sharyle LABOR, RPH-CPP as Pharmacist [place], Lenn HERO, LCSW as [facility], [facility]  Indicate any recent Medical Services you may have received from other than [facility] providers in the past year (date may be approximate).     Assessment:   This is a routine wellness examination for Elia.  Hearing/Vision screen Hearing Screening - Comments:: Denies hearing loss Vision Screening - Comments:: Gets eye exam, [facility] [place], [place]   Goals  Addressed               This Visit's Progress     Patient Stated (pt-stated)        Continue to health healthier and lose weight      Depression Screen    12/17/2023    2:11 PM 12/02/2023   12:10 PM 05/12/2023    2:20 PM 01/24/2023   10:21 AM 12/10/2022    8:19 AM 07/29/2022    9:26 AM 06/24/2022    9:27 AM  PHQ 2/9 Scores  PHQ - 2 Score 0 0 4 1 1 2  0  PHQ- 9 Score   12 5 2 6 4     Fall Risk    12/17/2023    2:17 PM 05/12/2023    2:20 PM 01/24/2023   10:21 AM 12/10/2022    8:24 AM 07/29/2022    9:26 AM  Fall Risk   Falls in the past year? 0 0 0 0 0  Number falls in past yr: 0 0 0 0 0  Injury with Fall? 0 0 0 0 0  Risk for fall due to : No Fall Risks  No Fall Risks No Fall Risks No Fall Risks  Follow up Falls prevention discussed;Falls evaluation completed  Falls evaluation completed Falls prevention discussed;Falls evaluation completed Falls evaluation completed    MEDICARE RISK AT HOME: Medicare Risk at Home Any stairs in or around the home?: Yes If so, are there any without handrails?: No Home free of loose throw rugs in walkways, pet beds, electrical cords, etc?: Yes Adequate lighting in your home to reduce risk of falls?: Yes Life alert?: No Use of a cane, walker or w/c?: No Grab bars in the bathroom?: No Shower chair or bench in shower?: No Elevated toilet seat or a handicapped toilet?: No  TIMED UP AND GO:  Was the test performed?  No    Cognitive Function:    09/20/2022   11:30 AM 05/31/2022   10:45 AM 02/15/2022   12:52 PM 09/17/2021    2:19 PM 04/13/2021   11:49 AM  MMSE - Mini Mental State Exam  Orientation to time       Orientation to Place       Registration       Attention/ Calculation       Recall  Language- name 2 objects       Language- repeat       Language- follow 3 step command       Language- read & follow direction       Write a sentence       Copy design       Total score          Information is confidential and restricted. Go to  Review Flowsheets to unlock data.         12/17/2023    2:18 PM 12/10/2022    8:28 AM  6CIT Screen  What Year? 0 points 0 points  What month? 0 points 0 points  What time? 0 points 0 points  Count back from 20 0 points 0 points  Months in reverse 0 points 4 points  Repeat phrase 0 points 6 points  Total Score 0 points 10 points    Immunizations Immunization History  Administered Date(s) Administered   Hepatitis B 12/16/2013   Influenza, Seasonal, Injecte, Preservative Fre 12/05/2023   Influenza,inj,Quad PF,6+ Mos 07/19/2013, 10/06/2018, 11/16/2020, 12/06/2021, 10/17/2022   PFIZER Comirnaty Alejos Top)Covid-19 Tri-Sucrose Vaccine 10/17/2022   PFIZER(Purple Top)SARS-COV-2 Vaccination 11/16/2020, 12/07/2020   Pneumococcal Polysaccharide-23 12/16/2013    TDAP status: Up to date per patient. Had at [facility] pharmacy  Flu Vaccine status: Up to date  Pneumococcal vaccine status: Due, Education has been provided regarding the importance of this vaccine. Advised may receive this vaccine at local pharmacy or Health Dept. Aware to provide a copy of the vaccination record if obtained from local pharmacy or Health Dept. Verbalized acceptance and understanding.  Covid-19 vaccine status: Completed vaccines  Qualifies for Shingles Vaccine? Yes   Zostavax completed No   Shingrix Completed?: No.    Education has been provided regarding the importance of this vaccine. Patient has been advised to call insurance company to determine out of pocket expense if they have not yet received this vaccine. Advised may also receive vaccine at local pharmacy or Health Dept. Verbalized acceptance and understanding.  Screening Tests Health Maintenance  Topic Date Due   DTaP/Tdap/Td (1 - Tdap) Never done   Zoster Vaccines- Shingrix (1 of 2) Never done   Pneumococcal Vaccine 35-24 Years old (2 of 2 - PCV) 12/16/2014   COVID-19 Vaccine (4 - 2024-25 season) 07/13/2023   Cervical Cancer Screening (HPV/Pap Cotest)   11/13/2023   Diabetic kidney evaluation - Urine ACR  01/16/2024   FOOT EXAM  01/16/2024   HEMOGLOBIN A1C  01/21/2024   MAMMOGRAM  02/26/2024   OPHTHALMOLOGY EXAM  06/01/2024   Diabetic kidney evaluation - eGFR measurement  07/23/2024   Medicare Annual Wellness (AWV)  12/16/2024   Colonoscopy  12/01/2028   INFLUENZA VACCINE  Completed   Hepatitis C Screening  Completed   HIV Screening  Completed   HPV VACCINES  Aged Out    Health Maintenance  Health Maintenance Due  Topic Date Due   DTaP/Tdap/Td (1 - Tdap) Never done   Zoster Vaccines- Shingrix (1 of 2) Never done   Pneumococcal Vaccine 66-50 Years old (2 of 2 - PCV) 12/16/2014   COVID-19 Vaccine (4 - 2024-25 season) 07/13/2023   Cervical Cancer Screening (HPV/Pap Cotest)  11/13/2023    Colorectal cancer screening: Type of screening: Colonoscopy. Completed 12/01/18. Repeat every 10 years  Mammogram status: Ordered 12/17/23. Pt provided with contact info and advised to call to schedule appt.    Lung Cancer Screening: (Low Dose CT Chest recommended if Age 90-80 years,  20 pack-year currently smoking OR have quit w/in 15years.) does not qualify.   Lung Cancer Screening Referral: n/a  Additional Screening:  Hepatitis C Screening: does not qualify; Completed 05/16/20  Vision Screening: Recommended annual ophthalmology exams for early detection of glaucoma and other disorders of the eye. Is the patient up to date with their annual eye exam?  Yes  Who is the provider or what is the name of the office in which the patient attends annual eye exams? [facility] [place] If pt is not established with a provider, would they like to be referred to a provider to establish care? No .   Dental Screening: Recommended annual dental exams for proper oral hygiene  Diabetic Foot Exam: Diabetic Foot Exam: Completed 01/16/23  Community Resource Referral / Chronic Care Management: CRR required this visit?  No   CCM required this visit?   No     Plan:     I have personally reviewed and noted the following in the patient's chart:   Medical and social history Use of alcohol , tobacco or illicit drugs  Current medications and supplements including opioid prescriptions. Patient is not currently taking opioid prescriptions. Functional ability and status Nutritional status Physical activity Advanced directives List of other physicians Hospitalizations, surgeries, and ER visits in previous 12 months Vitals Screenings to include cognitive, depression, and falls Referrals and appointments  In addition, I have reviewed and discussed with patient certain preventive protocols, quality metrics, and best practice recommendations. A written personalized care plan for preventive services as well as general preventive health recommendations were provided to patient.     Vina Ned, CMA   12/17/2023   After Visit Summary: (MyChart) Due to this being a telephonic visit, the after visit summary with patients personalized plan was offered to patient via MyChart   Nurse Notes:  Needs pneumonia vaccine Will get 2nd shingles vaccine next month Declined DM & Nutrition education Placed order for MMG due 02/26/24 Will be due for diabetic foot exam 01/16/24

## 2023-12-17 NOTE — Patient Instructions (Addendum)
 Ms. Geerdes , Thank you for taking time to come for your Medicare Wellness Visit. I appreciate your ongoing commitment to your health goals. Please review the following plan we discussed and let me know if I can assist you in the future.   Referrals/Orders/Follow-Ups/Clinician Recommendations: Discuss getting another pneumonia vaccine with your doctor. Get the second shingles vaccine next month. I have placed and order for a mammogram due 02/26/24. Call [facility] @ [phone] to schedule.  This is a list of the screening recommended for you and due dates:  Health Maintenance  Topic Date Due   DTaP/Tdap/Td vaccine (1 - Tdap) Never done   Zoster (Shingles) Vaccine (1 of 2) Never done   Pneumococcal Vaccination (2 of 2 - PCV) 12/16/2014   COVID-19 Vaccine (4 - 2024-25 season) 07/13/2023   Pap with HPV screening  11/13/2023   Yearly kidney health urinalysis for diabetes  01/16/2024   Complete foot exam   01/16/2024   Hemoglobin A1C  01/21/2024   Mammogram  02/26/2024   Eye exam for diabetics  06/01/2024   Yearly kidney function blood test for diabetes  07/23/2024   Medicare Annual Wellness Visit  12/16/2024   Colon Cancer Screening  12/01/2028   Flu Shot  Completed   Hepatitis C Screening  Completed   HIV Screening  Completed   HPV Vaccine  Aged Out    Advanced directives: (ACP Link)Information on Advanced Care Planning can be found at [place]  Secretary of State Advance Health Care Directives Advance Health Care Directives ([email or web address]  You may also get the forms at your doctor's office.  Once you have completed the forms, please bring a copy of your health care power of attorney and living will to the office to be added to your chart at your convenience.   Next Medicare Annual Wellness Visit scheduled for next year: Yes, 12/22/24 @ 1:50pm (phone visit)

## 2023-12-24 ENCOUNTER — Other Ambulatory Visit: Payer: Self-pay | Admitting: Pharmacist

## 2023-12-24 DIAGNOSIS — E119 Type 2 diabetes mellitus without complications: Secondary | ICD-10-CM

## 2023-12-24 NOTE — Progress Notes (Signed)
12/24/2023 Name: Melinda [name] MRN: [id number] DOB: [birth year]  Chief Complaint  Patient presents with   Medication Assistance    Angellina ALAYNAH SCHUTTER is a 53 y.o. year old female who presented for a telephone visit.   They were referred to the pharmacist by their PCP for assistance in managing medication access.    Subjective:  Care Team: Primary Care Provider: Erasmo Downer, MD ; Next Scheduled Visit: 02/06/2024   Medication Access/Adherence  Current Pharmacy:  [facility] - [place], [place] - [address] [place] [address] Phone: [phone] Fax: [phone]  [facility] [address], [place] - [address] [place] [address] Phone: [phone] Fax: [phone]  [facility] - [place], [place] - [address] [place] [address] Phone: [phone] Fax: [phone]   Patient reports affordability concerns with their medications: No  Patient reports access/transportation concerns to their pharmacy: No  Patient reports adherence concerns with their medications:  No       Diabetes:   Current medications: jardiance 25mg  daily, metformin ER 500mg  twice daily   Current glucose readings: recalls recent morning fasting readings range 100-130   Statin therapy: atorvastatin 10 mg daily   Current physical activity: reports limited to movement around her home   Current medication access support: enrolled in patient assistance for Jardiance from [facility] Cares for 2025    Objective:  Lab Results  Component Value Date   HGBA1C 7.1 (H) 07/24/2023    Lab Results  Component Value Date   CREATININE 0.73 07/24/2023   BUN 13 07/24/2023   NA 138 07/24/2023   K 4.6 07/24/2023   CL 101 07/24/2023   CO2 25 07/24/2023    Lab Results  Component Value Date   CHOL 132 01/16/2023   HDL 52 01/16/2023   LDLCALC 54 01/16/2023   TRIG 157 (H) 01/16/2023   CHOLHDL 2.5  01/16/2023    Medications Reviewed Today     Reviewed by Manuela Neptune, RPH-CPP (Pharmacist) on 12/24/23 at 1437  Med List Status: <None>   Medication Order Taking? Sig Documenting Provider Last Dose Status Informant  atorvastatin (LIPITOR) 10 MG tablet [id number]  TAKE 1 TABLET EVERY DAY Bacigalupo, Marzella Schlein, MD  Active   Blood Glucose Monitoring Suppl (TRUE METRIX METER) w/Device KIT [id number]  USE AS DIRECTED AS NEEDED  FOR  DIABETES  MELLITUS  TYPE 2 Osvaldo Angst M, PA-C  Active   buPROPion (WELLBUTRIN XL) 300 MG 24 hr tablet [id number]  TAKE 1 TABLET EVERY DAY Bacigalupo, Marzella Schlein, MD  Active   empagliflozin (JARDIANCE) 25 MG TABS tablet [id number] Yes Take 1 tablet (25 mg total) by mouth daily. Erasmo Downer, MD Taking Active   escitalopram (LEXAPRO) 20 MG tablet [id number]  TAKE 1 TABLET EVERY DAY Pardue, Monico Blitz, DO  Active   gabapentin (NEURONTIN) 300 MG capsule [id number]  Take 1 capsule (300 mg total) by mouth at bedtime. Erasmo Downer, MD  Active   lisinopril (ZESTRIL) 10 MG tablet [id number]  TAKE 1 TABLET EVERY DAY Bacigalupo, Marzella Schlein, MD  Active   metFORMIN (GLUCOPHAGE-XR) 500 MG 24 hr tablet [id number] Yes TAKE 1 TABLET (500 MG TOTAL) IN THE MORNING AND AT BEDTIME. Erasmo Downer, MD Taking Active   TRUE METRIX BLOOD GLUCOSE TEST test strip [id number]  CHECK FASTING BLOOD SUGAR ONCE DAILY Jacky Kindle, FNP  Active   TRUEplus Lancets 33G MISC [id number]  Use  to check blood glucose level one time daily. Simmons-Robinson, Tawanna Cooler, MD  Active   ziprasidone (GEODON) 80 MG capsule [id number]  TAKE 1 CAPSULE TWICE DAILY Pardue, Monico Blitz, DO  Active               Assessment/Plan:   Diabetes: - Reviewed long term cardiovascular and renal outcomes of uncontrolled blood sugar - Reviewed goal A1c, goal fasting, and goal 2 hour post prandial glucose - Have reviewed dietary modifications including importance of having regular well-balanced meals and snacks  throughout the day, while controlling carbohydrate portion sizes - Patient to follow up with [facility] Cares patient assistance as needed for refills of Jardiance     Follow Up Plan: Clinical Pharmacist will follow up with patient by telephone on 09/01/2024 at 3:30 PM    Estelle Grumbles, PharmD, [facility] [phone]

## 2023-12-24 NOTE — Patient Instructions (Signed)
Goals Addressed             This Visit's Progress    Pharmacy Goals       If you need to reach out to patient assistance programs regarding refills or to find out the status of your application, you can do so by calling:  [employer] [phone]  Estelle Grumbles, PharmD, [facility] [phone]

## 2023-12-29 ENCOUNTER — Other Ambulatory Visit: Payer: Self-pay | Admitting: Family Medicine

## 2023-12-30 NOTE — Telephone Encounter (Signed)
Requested medications are due for refill today.  yes  Requested medications are on the active medications list.  yes  Last refill. 10/10/2023 #90 0 rf  Future visit scheduled.   yes  Notes to clinic.  Refill not delegated.    Requested Prescriptions  Pending Prescriptions Disp Refills   ziprasidone (GEODON) 80 MG capsule [Pharmacy Med Name: Ziprasidone HCl Oral Capsule 80 MG] 180 capsule 3    Sig: TAKE 1 CAPSULE TWICE DAILY     Not Delegated - Psychiatry:  Antipsychotics - Second Generation (Atypical) - ziprasidone Failed - 12/30/2023  1:02 PM      Failed - This refill cannot be delegated      Failed - Lipid Panel in normal range within the last 12 months    Cholesterol, Total  Date Value Ref Range Status  01/16/2023 132 100 - 199 mg/dL Final   LDL Chol Calc (NIH)  Date Value Ref Range Status  01/16/2023 54 0 - 99 mg/dL Final   HDL  Date Value Ref Range Status  01/16/2023 52 >39 mg/dL Final   Triglycerides  Date Value Ref Range Status  01/16/2023 157 (H) 0 - 149 mg/dL Final         Passed - TSH in normal range and within 360 days    TSH  Date Value Ref Range Status  07/24/2023 3.150 0.450 - 4.500 uIU/mL Final         Passed - Completed PHQ-2 or PHQ-9 in the last 360 days      Passed - Last BP in normal range    BP Readings from Last 1 Encounters:  12/05/23 108/77         Passed - Last Heart Rate in normal range    Pulse Readings from Last 1 Encounters:  12/05/23 77         Passed - Valid encounter within last 6 months    Recent Outpatient Visits           3 weeks ago Obstructive sleep apnea syndrome   [place] [facility] [place], Marzella Schlein, MD   5 months ago Hypertension associated with diabetes [facility])   [place] [facility] [place], Marzella Schlein, MD   7 months ago Encounter for screening for COVID-19   [facility] [place], Sarah N, DO   11 months ago Viral gastroenteritis   [place]  [facility] [place], Marzella Schlein, MD   11 months ago Encounter for annual physical exam    [facility] [place], Marzella Schlein, MD       Future Appointments             In 1 month Bacigalupo, Marzella Schlein, MD [facility]            Passed - CBC within normal limits and completed in the last 12 months    WBC  Date Value Ref Range Status  07/24/2023 8.1 3.4 - 10.8 x10E3/uL Final  07/18/2017 7.9 3.6 - 11.0 K/uL Final   RBC  Date Value Ref Range Status  07/24/2023 5.31 (H) 3.77 - 5.28 x10E6/uL Final  07/18/2017 4.57 3.80 - 5.20 MIL/uL Final   Hemoglobin  Date Value Ref Range Status  07/24/2023 13.8 11.1 - 15.9 g/dL Final   Hematocrit  Date Value Ref Range Status  07/24/2023 43.4 34.0 - 46.6 % Final   MCHC  Date Value Ref Range Status  07/24/2023 31.8 31.5 - 35.7  g/dL Final  86/57/8469 62.9 32.0 - 36.0 g/dL Final   [facility]  Date Value Ref Range Status  07/24/2023 26.0 (L) 26.6 - 33.0 pg Final  07/18/2017 27.4 26.0 - 34.0 pg Final   MCV  Date Value Ref Range Status  07/24/2023 82 79 - 97 fL Final  05/08/2013 80 80 - 100 fL Final   No results found for: "PLTCOUNTKUC", "LABPLAT", "POCPLA" RDW  Date Value Ref Range Status  07/24/2023 12.6 11.7 - 15.4 % Final  05/08/2013 14.2 11.5 - 14.5 % Final         Passed - CMP within normal limits and completed in the last 12 months    Albumin  Date Value Ref Range Status  07/24/2023 4.3 3.8 - 4.9 g/dL Final  52/84/1324 3.2 (L) 3.4 - 5.0 g/dL Final   Alkaline Phosphatase  Date Value Ref Range Status  07/24/2023 143 (H) 44 - 121 IU/L Final  05/08/2013 97 50 - 136 Unit/L Final   ALT  Date Value Ref Range Status  07/24/2023 13 0 - 32 IU/L Final   SGPT (ALT)  Date Value Ref Range Status  05/08/2013 14 12 - 78 U/L Final   AST  Date Value Ref Range Status  07/24/2023 14 0 - 40 IU/L Final   SGOT(AST)  Date Value Ref Range Status  05/08/2013 14 (L) 15 -  37 Unit/L Final   BUN  Date Value Ref Range Status  07/24/2023 13 6 - 24 mg/dL Final  40/08/2724 7 7 - 18 mg/dL Final   Calcium  Date Value Ref Range Status  07/24/2023 10.2 8.7 - 10.2 mg/dL Final   Calcium, Total  Date Value Ref Range Status  05/08/2013 8.8 8.5 - 10.1 mg/dL Final   CO2  Date Value Ref Range Status  07/24/2023 25 20 - 29 mmol/L Final   Co2  Date Value Ref Range Status  05/08/2013 28 21 - 32 mmol/L Final   Creatinine  Date Value Ref Range Status  05/08/2013 0.77 0.60 - 1.30 mg/dL Final   Creatinine, Ser  Date Value Ref Range Status  07/24/2023 0.73 0.57 - 1.00 mg/dL Final   Glucose  Date Value Ref Range Status  07/24/2023 93 70 - 99 mg/dL Final  36/64/4034 742 (H) 65 - 99 mg/dL Final   Glucose-Capillary  Date Value Ref Range Status  04/25/2023 147 (H) 70 - 99 mg/dL Final    Comment:    Glucose reference range applies only to samples taken after fasting for at least 8 hours.   Potassium  Date Value Ref Range Status  07/24/2023 4.6 3.5 - 5.2 mmol/L Final  05/08/2013 3.8 3.5 - 5.1 mmol/L Final   Sodium  Date Value Ref Range Status  07/24/2023 138 134 - 144 mmol/L Final  05/08/2013 139 136 - 145 mmol/L Final   Bilirubin,Total  Date Value Ref Range Status  05/08/2013 0.3 0.2 - 1.0 mg/dL Final   Bilirubin Total  Date Value Ref Range Status  07/24/2023 0.3 0.0 - 1.2 mg/dL Final   Bilirubin, Direct  Date Value Ref Range Status  03/21/2022 <0.10 0.00 - 0.40 mg/dL Final   Protein  Date Value Ref Range Status  05/08/2013 Negative NEGATIVE Final   Total Protein  Date Value Ref Range Status  07/24/2023 7.0 6.0 - 8.5 g/dL Final  59/56/3875 6.7 6.4 - 8.2 g/dL Final   EGFR (African American)  Date Value Ref Range Status  05/08/2013 >60  Final   GFR calc Af Denyse [place]ago  Date Value Ref Range Status  05/16/2020 90 >59 mL/min/1.73 Final    Comment:    **[facility] currently reports eGFR in compliance with the current**   recommendations of the  [employer]. [facility] will   update reporting as new guidelines are published from the NKF-ASN   Task force.    eGFR  Date Value Ref Range Status  07/24/2023 98 >59 mL/min/1.73 Final   EGFR (Non-African Amer.)  Date Value Ref Range Status  05/08/2013 >60  Final    Comment:    eGFR values <36mL/min/1.73 m2 may be an indication of chronic kidney disease (CKD). Calculated eGFR is useful in patients with stable renal function. The eGFR calculation will not be reliable in acutely ill patients when serum creatinine is changing rapidly. It is not useful in  patients on dialysis. The eGFR calculation may not be applicable to patients at the low and high extremes of body sizes, pregnant women, and vegetarians.    GFR calc non Af Amer  Date Value Ref Range Status  05/16/2020 78 >59 mL/min/1.73 Final         Signed Prescriptions Disp Refills   escitalopram (LEXAPRO) 20 MG tablet 90 tablet 0    Sig: TAKE 1 TABLET EVERY DAY     Psychiatry:  Antidepressants - SSRI Passed - 12/30/2023  1:02 PM      Passed - Completed PHQ-2 or PHQ-9 in the last 360 days      Passed - Valid encounter within last 6 months    Recent Outpatient Visits           3 weeks ago Obstructive sleep apnea syndrome   [place] [facility] [place], Marzella Schlein, MD   5 months ago Hypertension associated with diabetes [facility])   [place] [facility] [place], Marzella Schlein, MD   7 months ago Encounter for screening for COVID-19   [facility] [place], Monico Blitz, DO   11 months ago Viral gastroenteritis   [place] [facility] [place], Marzella Schlein, MD   11 months ago Encounter for annual physical exam   [place] [facility] [place], Marzella Schlein, MD       Future Appointments             In 1 month Bacigalupo, Marzella Schlein, MD [facility], [facility]

## 2023-12-30 NOTE — Telephone Encounter (Signed)
Requested Prescriptions  Pending Prescriptions Disp Refills   ziprasidone (GEODON) 80 MG capsule [Pharmacy Med Name: Ziprasidone HCl Oral Capsule 80 MG] 180 capsule 3    Sig: TAKE 1 CAPSULE TWICE DAILY     Not Delegated - Psychiatry:  Antipsychotics - Second Generation (Atypical) - ziprasidone Failed - 12/30/2023  1:02 PM      Failed - This refill cannot be delegated      Failed - Lipid Panel in normal range within the last 12 months    Cholesterol, Total  Date Value Ref Range Status  01/16/2023 132 100 - 199 mg/dL Final   LDL Chol Calc (NIH)  Date Value Ref Range Status  01/16/2023 54 0 - 99 mg/dL Final   HDL  Date Value Ref Range Status  01/16/2023 52 >39 mg/dL Final   Triglycerides  Date Value Ref Range Status  01/16/2023 157 (H) 0 - 149 mg/dL Final         Passed - TSH in normal range and within 360 days    TSH  Date Value Ref Range Status  07/24/2023 3.150 0.450 - 4.500 uIU/mL Final         Passed - Completed PHQ-2 or PHQ-9 in the last 360 days      Passed - Last BP in normal range    BP Readings from Last 1 Encounters:  12/05/23 108/77         Passed - Last Heart Rate in normal range    Pulse Readings from Last 1 Encounters:  12/05/23 77         Passed - Valid encounter within last 6 months    Recent Outpatient Visits           3 weeks ago Obstructive sleep apnea syndrome   [place] [facility] [place], Marzella Schlein, MD   5 months ago Hypertension associated with diabetes [facility])   [place] [facility] [place], Marzella Schlein, MD   7 months ago Encounter for screening for COVID-19   [facility] [place], Sarah N, DO   11 months ago Viral gastroenteritis   [facility] [place], Marzella Schlein, MD   11 months ago Encounter for annual physical exam   [place] [facility] [place], Marzella Schlein, MD       Future Appointments             In 1 month Bacigalupo,  Marzella Schlein, MD [facility], [facility]            Passed - CBC within normal limits and completed in the last 12 months    WBC  Date Value Ref Range Status  07/24/2023 8.1 3.4 - 10.8 x10E3/uL Final  07/18/2017 7.9 3.6 - 11.0 K/uL Final   RBC  Date Value Ref Range Status  07/24/2023 5.31 (H) 3.77 - 5.28 x10E6/uL Final  07/18/2017 4.57 3.80 - 5.20 MIL/uL Final   Hemoglobin  Date Value Ref Range Status  07/24/2023 13.8 11.1 - 15.9 g/dL Final   Hematocrit  Date Value Ref Range Status  07/24/2023 43.4 34.0 - 46.6 % Final   MCHC  Date Value Ref Range Status  07/24/2023 31.8 31.5 - 35.7 g/dL Final  40/98/1191 47.8 32.0 - 36.0 g/dL Final   [facility]  Date Value Ref Range Status  07/24/2023 26.0 (L) 26.6 - 33.0 pg Final  07/18/2017 27.4 26.0 - 34.0 pg Final   MCV  Date Value Ref Range Status  07/24/2023 82 79 - 97 fL Final  05/08/2013 80 80 - 100 fL Final   No results found for: "PLTCOUNTKUC", "LABPLAT", "POCPLA" RDW  Date Value Ref Range Status  07/24/2023 12.6 11.7 - 15.4 % Final  05/08/2013 14.2 11.5 - 14.5 % Final         Passed - CMP within normal limits and completed in the last 12 months    Albumin  Date Value Ref Range Status  07/24/2023 4.3 3.8 - 4.9 g/dL Final  95/28/4132 3.2 (L) 3.4 - 5.0 g/dL Final   Alkaline Phosphatase  Date Value Ref Range Status  07/24/2023 143 (H) 44 - 121 IU/L Final  05/08/2013 97 50 - 136 Unit/L Final   ALT  Date Value Ref Range Status  07/24/2023 13 0 - 32 IU/L Final   SGPT (ALT)  Date Value Ref Range Status  05/08/2013 14 12 - 78 U/L Final   AST  Date Value Ref Range Status  07/24/2023 14 0 - 40 IU/L Final   SGOT(AST)  Date Value Ref Range Status  05/08/2013 14 (L) 15 - 37 Unit/L Final   BUN  Date Value Ref Range Status  07/24/2023 13 6 - 24 mg/dL Final  44/11/270 7 7 - 18 mg/dL Final   Calcium  Date Value Ref Range Status  07/24/2023 10.2 8.7 - 10.2 mg/dL Final   Calcium, Total  Date Value  Ref Range Status  05/08/2013 8.8 8.5 - 10.1 mg/dL Final   CO2  Date Value Ref Range Status  07/24/2023 25 20 - 29 mmol/L Final   Co2  Date Value Ref Range Status  05/08/2013 28 21 - 32 mmol/L Final   Creatinine  Date Value Ref Range Status  05/08/2013 0.77 0.60 - 1.30 mg/dL Final   Creatinine, Ser  Date Value Ref Range Status  07/24/2023 0.73 0.57 - 1.00 mg/dL Final   Glucose  Date Value Ref Range Status  07/24/2023 93 70 - 99 mg/dL Final  53/66/4403 474 (H) 65 - 99 mg/dL Final   Glucose-Capillary  Date Value Ref Range Status  04/25/2023 147 (H) 70 - 99 mg/dL Final    Comment:    Glucose reference range applies only to samples taken after fasting for at least 8 hours.   Potassium  Date Value Ref Range Status  07/24/2023 4.6 3.5 - 5.2 mmol/L Final  05/08/2013 3.8 3.5 - 5.1 mmol/L Final   Sodium  Date Value Ref Range Status  07/24/2023 138 134 - 144 mmol/L Final  05/08/2013 139 136 - 145 mmol/L Final   Bilirubin,Total  Date Value Ref Range Status  05/08/2013 0.3 0.2 - 1.0 mg/dL Final   Bilirubin Total  Date Value Ref Range Status  07/24/2023 0.3 0.0 - 1.2 mg/dL Final   Bilirubin, Direct  Date Value Ref Range Status  03/21/2022 <0.10 0.00 - 0.40 mg/dL Final   Protein  Date Value Ref Range Status  05/08/2013 Negative NEGATIVE Final   Total Protein  Date Value Ref Range Status  07/24/2023 7.0 6.0 - 8.5 g/dL Final  25/95/6387 6.7 6.4 - 8.2 g/dL Final   EGFR (African American)  Date Value Ref Range Status  05/08/2013 >60  Final   GFR calc Af Amer  Date Value Ref Range Status  05/16/2020 90 >59 mL/min/1.73 Final    Comment:    **[facility] currently reports eGFR in compliance with the current**   recommendations of the [employer]. [facility] will   update reporting as new guidelines are published  from the NKF-ASN   Task force.    eGFR  Date Value Ref Range Status  07/24/2023 98 >59 mL/min/1.73 Final   EGFR (Non-African Amer.)  Date  Value Ref Range Status  05/08/2013 >60  Final    Comment:    eGFR values <79mL/min/1.73 m2 may be an indication of chronic kidney disease (CKD). Calculated eGFR is useful in patients with stable renal function. The eGFR calculation will not be reliable in acutely ill patients when serum creatinine is changing rapidly. It is not useful in  patients on dialysis. The eGFR calculation may not be applicable to patients at the low and high extremes of body sizes, pregnant women, and vegetarians.    GFR calc non Af Amer  Date Value Ref Range Status  05/16/2020 78 >59 mL/min/1.73 Final          escitalopram (LEXAPRO) 20 MG tablet [Pharmacy Med Name: Escitalopram Oxalate Oral Tablet 20 MG] 90 tablet 0    Sig: TAKE 1 TABLET EVERY DAY     Psychiatry:  Antidepressants - SSRI Passed - 12/30/2023  1:02 PM      Passed - Completed PHQ-2 or PHQ-9 in the last 360 days      Passed - Valid encounter within last 6 months    Recent Outpatient Visits           3 weeks ago Obstructive sleep apnea syndrome   [place] [facility], Marzella Schlein, MD   5 months ago Hypertension associated with diabetes [facility])   [place] [facility] [place], Marzella Schlein, MD   7 months ago Encounter for screening for COVID-19   [facility] [place], Monico Blitz, DO   11 months ago Viral gastroenteritis   [facility] [place], Marzella Schlein, MD   11 months ago Encounter for annual physical exam   [facility] [place], Marzella Schlein, MD       Future Appointments             In 1 month Bacigalupo, Marzella Schlein, MD [facility]

## 2024-01-15 ENCOUNTER — Telehealth: Payer: Self-pay | Admitting: Family Medicine

## 2024-01-15 NOTE — Telephone Encounter (Signed)
 Left message that provider is not available 3/28 needs to reschedule for another day & time

## 2024-02-04 ENCOUNTER — Other Ambulatory Visit: Payer: Self-pay | Admitting: Family Medicine

## 2024-02-05 NOTE — Telephone Encounter (Signed)
 Requested Prescriptions  Pending Prescriptions Disp Refills   gabapentin (NEURONTIN) 300 MG capsule [Pharmacy Med Name: Gabapentin Oral Capsule 300 MG] 90 capsule 0    Sig: TAKE 1 CAPSULE AT BEDTIME     Neurology: Anticonvulsants - gabapentin Passed - 02/05/2024 12:44 PM      Passed - Cr in normal range and within 360 days    Creatinine  Date Value Ref Range Status  05/08/2013 0.77 0.60 - 1.30 mg/dL Final   Creatinine, Ser  Date Value Ref Range Status  07/24/2023 0.73 0.57 - 1.00 mg/dL Final         Passed - Completed PHQ-2 or PHQ-9 in the last 360 days      Passed - Valid encounter within last 12 months    Recent Outpatient Visits   None     Future Appointments             In 5 days Bacigalupo, Marzella Schlein, MD [facility]

## 2024-02-06 ENCOUNTER — Encounter: Payer: Self-pay | Admitting: Family Medicine

## 2024-02-10 ENCOUNTER — Encounter: Payer: Self-pay | Admitting: Family Medicine

## 2024-02-10 ENCOUNTER — Other Ambulatory Visit (HOSPITAL_COMMUNITY)
Admission: RE | Admit: 2024-02-10 | Discharge: 2024-02-10 | Disposition: A | Discharge status: Home / Self Care | Source: Ambulatory Visit | Attending: Family Medicine | Admitting: Family Medicine

## 2024-02-10 ENCOUNTER — Ambulatory Visit: Admitting: Family Medicine

## 2024-02-10 VITALS — BP 122/78 | HR 85 | Ht 63.0 in | Wt 230.5 lb

## 2024-02-10 DIAGNOSIS — E1169 Type 2 diabetes mellitus with other specified complication: Secondary | ICD-10-CM

## 2024-02-10 DIAGNOSIS — Z124 Encounter for screening for malignant neoplasm of cervix: Secondary | ICD-10-CM | POA: Insufficient documentation

## 2024-02-10 DIAGNOSIS — G4733 Obstructive sleep apnea (adult) (pediatric): Secondary | ICD-10-CM

## 2024-02-10 DIAGNOSIS — I152 Hypertension secondary to endocrine disorders: Secondary | ICD-10-CM

## 2024-02-10 DIAGNOSIS — Z1151 Encounter for screening for human papillomavirus (HPV): Secondary | ICD-10-CM | POA: Insufficient documentation

## 2024-02-10 DIAGNOSIS — L80 Vitiligo: Secondary | ICD-10-CM

## 2024-02-10 DIAGNOSIS — F332 Major depressive disorder, recurrent severe without psychotic features: Secondary | ICD-10-CM

## 2024-02-10 DIAGNOSIS — Z01419 Encounter for gynecological examination (general) (routine) without abnormal findings: Secondary | ICD-10-CM | POA: Insufficient documentation

## 2024-02-10 DIAGNOSIS — F3175 Bipolar disorder, in partial remission, most recent episode depressed: Secondary | ICD-10-CM

## 2024-02-10 DIAGNOSIS — E559 Vitamin D deficiency, unspecified: Secondary | ICD-10-CM

## 2024-02-10 DIAGNOSIS — E1159 Type 2 diabetes mellitus with other circulatory complications: Secondary | ICD-10-CM | POA: Diagnosis not present

## 2024-02-10 DIAGNOSIS — Z0001 Encounter for general adult medical examination with abnormal findings: Secondary | ICD-10-CM | POA: Diagnosis not present

## 2024-02-10 DIAGNOSIS — E1142 Type 2 diabetes mellitus with diabetic polyneuropathy: Secondary | ICD-10-CM

## 2024-02-10 DIAGNOSIS — Z0000 Encounter for general adult medical examination without abnormal findings: Secondary | ICD-10-CM

## 2024-02-10 DIAGNOSIS — E038 Other specified hypothyroidism: Secondary | ICD-10-CM

## 2024-02-10 DIAGNOSIS — E785 Hyperlipidemia, unspecified: Secondary | ICD-10-CM

## 2024-02-10 DIAGNOSIS — Z6841 Body Mass Index (BMI) 40.0 and over, adult: Secondary | ICD-10-CM

## 2024-02-10 NOTE — Assessment & Plan Note (Signed)
 Blood pressure is well-controlled with current medication regimen. - Continue lisinopril 10 mg daily

## 2024-02-10 NOTE — Assessment & Plan Note (Signed)
 Current medications include Geodon, Lexapro, and bupropion. She reports being in a good place with current treatment regimen. - Continue Geodon 80 mg twice daily - Continue Lexapro 20 mg daily - Continue bupropion XL 300 mg daily

## 2024-02-10 NOTE — Assessment & Plan Note (Signed)
 Discussed importance of healthy weight management Discussed diet and exercise

## 2024-02-10 NOTE — Assessment & Plan Note (Signed)
 Recheck TSH and free T4

## 2024-02-10 NOTE — Assessment & Plan Note (Signed)
 Not using CPAP machine due to improved sleep with new adjustable bed. No current symptoms of sleep apnea such as waking up short of breath or with headaches.  - Consider repeat sleep study if symptoms of sleep apnea arise

## 2024-02-10 NOTE — Assessment & Plan Note (Signed)
 Diabetes management with Jardiance and metformin XR. Last A1c was 7.1, aiming for below 7. Current fasting blood glucose is 94, within normal range. - Continue Jardiance 25 mg daily - Continue metformin XR 500 mg twice daily - Check A1c as part of routine labs

## 2024-02-10 NOTE — Assessment & Plan Note (Signed)
Well controlled with gabapentin - continue 300mg  qhs

## 2024-02-10 NOTE — Patient Instructions (Signed)
 Call [facility] to schedule a mammogram [phone]

## 2024-02-10 NOTE — Progress Notes (Signed)
 Complete physical exam   Patient: Melinda [name]   DOB: July 23, 1971   53 y.o. Female  MRN: [id number] Visit Date: 02/10/2024  Today's healthcare provider: Shirlee Latch, MD   Chief Complaint  Patient presents with   Annual Exam    Last completed 01/16/23; AWV 12/17/23 Diet - General unhealthy, somewhat of intermittent fasting usually eating between 2 and 3 am    Exercise - none Feeling - okay, light headed at times and ache in toes Sleeping - fairly well, has not used cpap in a while  Concerns -  none   Care Management    Pneumonia Vaccine - ok to receive    Gynecologic Exam    Declined STD/STI testing. Not sexually active    Subjective    Melinda [name] is a 53 y.o. female who presents today for a complete physical exam.    Discussed the use of AI scribe software for clinical note transcription with the patient, who gave verbal consent to proceed.  History of Present Illness   The patient, with a history of diabetes, high cholesterol, hypertension, neuropathic pain, mental health conditions, and sleep apnea, presents for a routine physical examination. The patient is due for a Pap smear, which is performed every five years. The patient has also recently received shingles and tetanus shots, and is due for a pneumonia shot in about a month. The patient reports occasional pain in the toes, particularly in the left foot. The pain is not constant and does not seem to be affected by different shoes. The patient's diabetes is managed with Jardiance and metformin, and she reports a recent fasting blood sugar of 94, which is within the normal range. The patient's high cholesterol is managed with atorvastatin, and her hypertension is managed with lisinopril. The patient's neuropathic pain is managed with gabapentin, and she reports occasional joint pain. The patient's mental health conditions are managed with Geodon, Lexapro, and bupropion. The patient also uses a CPAP machine for  sleep apnea, but reports not using it regularly due to improved sleep with a new adjustable bed.        Last depression screening scores    02/10/2024   10:34 AM 12/17/2023    2:11 PM 12/02/2023   12:10 PM  PHQ 2/9 Scores  PHQ - 2 Score 0 0 0  PHQ- 9 Score 6     Last fall risk screening    02/10/2024   10:34 AM  Fall Risk   Falls in the past year? 0  Number falls in past yr: 0  Injury with Fall? 0  Risk for fall due to : No Fall Risks  Follow up Falls evaluation completed        Medications: Outpatient Medications Prior to Visit  Medication Sig   atorvastatin (LIPITOR) 10 MG tablet TAKE 1 TABLET EVERY DAY   Blood Glucose Monitoring Suppl (TRUE METRIX METER) w/Device KIT USE AS DIRECTED AS NEEDED  FOR  DIABETES  MELLITUS  TYPE 2   buPROPion (WELLBUTRIN XL) 300 MG 24 hr tablet TAKE 1 TABLET EVERY DAY   empagliflozin (JARDIANCE) 25 MG TABS tablet Take 1 tablet (25 mg total) by mouth daily.   escitalopram (LEXAPRO) 20 MG tablet TAKE 1 TABLET EVERY DAY   gabapentin (NEURONTIN) 300 MG capsule TAKE 1 CAPSULE AT BEDTIME   lisinopril (ZESTRIL) 10 MG tablet TAKE 1 TABLET EVERY DAY   metFORMIN (GLUCOPHAGE-XR) 500 MG 24 hr tablet TAKE 1 TABLET (500 MG TOTAL) IN  THE MORNING AND AT BEDTIME.   TRUE METRIX BLOOD GLUCOSE TEST test strip CHECK FASTING BLOOD SUGAR ONCE DAILY   TRUEplus Lancets 33G MISC Use to check blood glucose level one time daily.   ziprasidone (GEODON) 80 MG capsule TAKE 1 CAPSULE TWICE DAILY   No facility-administered medications prior to visit.    Review of Systems    Objective    BP 122/78 (BP Location: Right Arm, Patient Position: Sitting, Cuff Size: Large)   Pulse 85   Ht 5\' 3"  (1.6 m)   Wt 230 lb 8 oz (104.6 kg)   LMP  (LMP Unknown) Comment: pt states she has not had period in the past year  SpO2 98%   BMI 40.83 kg/m    Physical Exam Vitals reviewed.  Constitutional:      General: She is not in acute distress.    Appearance: Normal appearance. She  is well-developed. She is not diaphoretic.  HENT:     Head: Normocephalic and atraumatic.     Right Ear: Tympanic membrane, ear canal and external ear normal.     Left Ear: Tympanic membrane, ear canal and external ear normal.     Nose: Nose normal.     Mouth/Throat:     Mouth: Mucous membranes are moist.     Pharynx: Oropharynx is clear. No oropharyngeal exudate.  Eyes:     General: No scleral icterus.    Conjunctiva/sclera: Conjunctivae normal.     Pupils: Pupils are equal, round, and reactive to light.  Neck:     Thyroid: No thyromegaly.  Cardiovascular:     Rate and Rhythm: Normal rate and regular rhythm.     Heart sounds: Normal heart sounds. No murmur heard. Pulmonary:     Effort: Pulmonary effort is normal. No respiratory distress.     Breath sounds: Normal breath sounds. No wheezing or rales.  Abdominal:     General: There is no distension.     Palpations: Abdomen is soft.     Tenderness: There is no abdominal tenderness.  Genitourinary:    Comments: GYN:  External genitalia within normal limits.  Vaginal mucosa pink, moist, normal rugae.  Nonfriable cervix without lesions, no discharge or bleeding noted on speculum exam.    Musculoskeletal:        General: No deformity.     Cervical back: Neck supple.     Right lower leg: No edema.     Left lower leg: No edema.  Lymphadenopathy:     Cervical: No cervical adenopathy.  Skin:    General: Skin is warm and dry.     Findings: No rash.  Neurological:     Mental Status: She is alert and oriented to person, place, and time. Mental status is at baseline.     Gait: Gait normal.  Psychiatric:        Mood and Affect: Mood normal.        Behavior: Behavior normal.        Thought Content: Thought content normal.      No results found for any visits on 02/10/24.  Assessment & Plan    Routine Health Maintenance and Physical Exam  Exercise Activities and Dietary recommendations  Goals       care coordination activities       Activities and task to complete in order to accomplish goals.   EMOTIONAL / MENTAL HEALTH SUPPORT Keep all upcoming appointment discussed today Self Support options  (please continue to utilizing coping strategies for  depression dicussed today, continue to accept support from family, CSW to assist with coordinating ongoing mental health support as needed)       Patient Stated (pt-stated)      Continue to health healthier and lose weight      Pharmacy Goals      If you need to reach out to patient assistance programs regarding refills or to find out the status of your application, you can do so by calling:  [employer] [phone]  Estelle Grumbles, PharmD, [facility] [phone]        Immunization History  Administered Date(s) Administered   Hepatitis B 12/16/2013   Influenza, Seasonal, Injecte, Preservative Fre 12/05/2023   Influenza,inj,Quad PF,6+ Mos 07/19/2013, 10/06/2018, 11/16/2020, 12/06/2021, 10/17/2022   PFIZER Comirnaty(Gray Top)Covid-19 Tri-Sucrose Vaccine 10/17/2022   PFIZER(Purple Top)SARS-COV-2 Vaccination 11/16/2020, 12/07/2020   Pneumococcal Polysaccharide-23 12/16/2013    Health Maintenance  Topic Date Due   DTaP/Tdap/Td (1 - Tdap) Never done   Zoster Vaccines- Shingrix (1 of 2) Never done   Pneumococcal Vaccine 41-56 Years old (2 of 2 - PCV) 12/16/2014   COVID-19 Vaccine (4 - 2024-25 season) 07/13/2023   Cervical Cancer Screening (HPV/Pap Cotest)  11/13/2023   Diabetic kidney evaluation - Urine ACR  01/16/2024   HEMOGLOBIN A1C  01/21/2024   MAMMOGRAM  02/26/2024   OPHTHALMOLOGY EXAM  06/01/2024   INFLUENZA VACCINE  06/11/2024   Diabetic kidney evaluation - eGFR measurement  07/23/2024   Medicare Annual Wellness (AWV)  12/16/2024   FOOT EXAM  02/09/2025   Colonoscopy  12/01/2028   Hepatitis C Screening  Completed   HIV Screening  Completed   HPV VACCINES  Aged Out    Discussed health benefits of physical  activity, and encouraged her to engage in regular exercise appropriate for her age and condition.  Problem List Items Addressed This Visit       Cardiovascular and Mediastinum   Hypertension associated with diabetes (HCC)   Blood pressure is well-controlled with current medication regimen. - Continue lisinopril 10 mg daily      Relevant Orders   Urine Albumin/Creatinine with ratio (send out) [LAB689]   Comprehensive metabolic panel with GFR   Hemoglobin A1c     Respiratory   Apnea, sleep   Not using CPAP machine due to improved sleep with new adjustable bed. No current symptoms of sleep apnea such as waking up short of breath or with headaches.  - Consider repeat sleep study if symptoms of sleep apnea arise         Endocrine   T2DM (type 2 diabetes mellitus) (HCC)   Diabetes management with Jardiance and metformin XR. Last A1c was 7.1, aiming for below 7. Current fasting blood glucose is 94, within normal range. - Continue Jardiance 25 mg daily - Continue metformin XR 500 mg twice daily - Check A1c as part of routine labs      Relevant Orders   Comprehensive metabolic panel with GFR   Hemoglobin A1c   Hyperlipidemia associated with type 2 diabetes mellitus (HCC)   Cholesterol management with atorvastatin. - Continue atorvastatin 10 mg daily      Relevant Orders   Lipid Panel With LDL/HDL Ratio   Subclinical hypothyroidism   Recheck TSH and free T4       Relevant Orders   TSH + free T4   Diabetic polyneuropathy associated with type 2 diabetes mellitus (HCC)   Well controlled with gabapentin - continue 300mg  qhs  Musculoskeletal and Integument   Vitiligo     Other   Morbid obesity with BMI of 40.0-44.9, adult (HCC) (Chronic)   Discussed importance of healthy weight management Discussed diet and exercise       Relevant Orders   Comprehensive metabolic panel with GFR   Hemoglobin A1c   Major depressive disorder, recurrent severe without psychotic  features (HCC)   Current medications include Geodon, Lexapro, and bupropion. She reports being in a good place with current treatment regimen. - Continue Geodon 80 mg twice daily - Continue Lexapro 20 mg daily - Continue bupropion XL 300 mg daily      Affective bipolar disorder (HCC)   Current medications include Geodon, Lexapro, and bupropion. She reports being in a good place with current treatment regimen. - Continue Geodon 80 mg twice daily - Continue Lexapro 20 mg daily - Continue bupropion XL 300 mg daily      Other Visit Diagnoses       Encounter for annual physical exam    -  Primary     Cervical cancer screening       Relevant Orders   Cytology - PAP     Avitaminosis D       Relevant Orders   VITAMIN D 25 Hydroxy (Vit-D Deficiency, Fractures)          Arthritis Intermittent toe pain likely due to arthritis in toe joints. Pain is not constant and does not worsen with specific shoes. Gabapentin is used at bedtime, which may help with pain. Advised wearing shoes with a wide toe box to prevent pressure on toes. - Consider increasing gabapentin if toe pain worsens - Advise wearing shoes with a wide toe box - Recommend rest and ice for toe pain as needed  Vitiligo Loss of pigment in underarms, likely vitiligo. She is not concerned and does not wish to pursue treatment at this time. - No treatment for vitiligo as per her preference  General Health Maintenance Pap smear is due as it has been five years since the last one. Shingles and tetanus vaccinations are up to date. Pneumonia vaccination is pending due to recent shingles vaccination; advised to wait four weeks for optimal efficacy. Mammogram is due this month. Colonoscopy is up to date until 2030. Eye exam was completed in July of last year. Routine labs including A1c, thyroid, cholesterol, vitamin D, and kidney and liver function tests are planned. - Perform Pap smear - Schedule pneumonia vaccination for late April  or early May - Schedule mammogram for on or after April 17th - Perform routine labs including A1c, thyroid, cholesterol, vitamin D, and kidney and liver function tests - Schedule six-month follow-up visit          Return in about 6 months (around 08/11/2024) for chronic disease f/u.     Shirlee Latch, MD  [facility] [phone] (phone) [phone] (fax)  [facility]

## 2024-02-10 NOTE — Assessment & Plan Note (Signed)
 Cholesterol management with atorvastatin. - Continue atorvastatin 10 mg daily

## 2024-02-11 LAB — LIPID PANEL WITH LDL/HDL RATIO
Cholesterol, Total: 128 mg/dL (ref 100–199)
HDL: 42 mg/dL (ref >39)
LDL Chol Calc (NIH): 71 mg/dL (ref 0–99)
LDL/HDL Ratio: 1.7 ratio (ref 0.0–3.2)
Triglycerides: 72 mg/dL (ref 0–149)
VLDL Cholesterol Cal: 15 mg/dL (ref 5–40)

## 2024-02-11 LAB — CYTOLOGY - PAP
Adequacy: ABSENT
Comment: NEGATIVE
Diagnosis: NEGATIVE
High risk HPV: NEGATIVE

## 2024-02-11 LAB — MICROALBUMIN / CREATININE URINE RATIO
Creatinine, Urine: 57.8 mg/dL
Microalb/Creat Ratio: <5 mg/g{creat} (ref 0–29)
Microalbumin, Urine: <3 ug/mL

## 2024-02-11 LAB — COMPREHENSIVE METABOLIC PANEL WITH GFR
ALT: 12 IU/L (ref 0–32)
AST: 14 IU/L (ref 0–40)
Albumin: 4.2 g/dL (ref 3.8–4.9)
Alkaline Phosphatase: 148 IU/L — ABNORMAL HIGH (ref 44–121)
BUN/Creatinine Ratio: 11 (ref 9–23)
BUN: 8 mg/dL (ref 6–24)
Bilirubin Total: 0.2 mg/dL (ref 0.0–1.2)
CO2: 20 mmol/L (ref 20–29)
Calcium: 9 mg/dL (ref 8.7–10.2)
Chloride: 105 mmol/L (ref 96–106)
Creatinine, Ser: 0.71 mg/dL (ref 0.57–1.00)
Globulin, Total: 2.5 g/dL (ref 1.5–4.5)
Glucose: 117 mg/dL — ABNORMAL HIGH (ref 70–99)
Potassium: 4.4 mmol/L (ref 3.5–5.2)
Sodium: 142 mmol/L (ref 134–144)
Total Protein: 6.7 g/dL (ref 6.0–8.5)
eGFR: 100 mL/min/{1.73_m2} (ref >59)

## 2024-02-11 LAB — HEMOGLOBIN A1C
Est. average glucose Bld gHb Est-mCnc: 154 mg/dL
Hgb A1c MFr Bld: 7 % — ABNORMAL HIGH (ref 4.8–5.6)

## 2024-02-11 LAB — VITAMIN D 25 HYDROXY (VIT D DEFICIENCY, FRACTURES): Vit D, 25-Hydroxy: 8.8 ng/mL — ABNORMAL LOW (ref 30.0–100.0)

## 2024-02-11 LAB — TSH+FREE T4
Free T4: 1.01 ng/dL (ref 0.82–1.77)
TSH: 4.16 u[IU]/mL (ref 0.450–4.500)

## 2024-02-12 ENCOUNTER — Encounter: Payer: Self-pay | Admitting: Family Medicine

## 2024-02-13 ENCOUNTER — Other Ambulatory Visit: Payer: Self-pay

## 2024-02-13 ENCOUNTER — Encounter: Payer: Self-pay | Admitting: Family Medicine

## 2024-02-13 DIAGNOSIS — E559 Vitamin D deficiency, unspecified: Secondary | ICD-10-CM

## 2024-02-13 MED ORDER — VITAMIN D (ERGOCALCIFEROL) 1.25 MG (50000 UNIT) PO CAPS: 50000.0000 [IU] | ORAL_CAPSULE | ORAL | 0 refills | Status: DC

## 2024-02-23 ENCOUNTER — Encounter: Payer: Self-pay | Admitting: Family Medicine

## 2024-02-24 ENCOUNTER — Other Ambulatory Visit: Payer: Self-pay | Admitting: Family Medicine

## 2024-02-24 DIAGNOSIS — E1165 Type 2 diabetes mellitus with hyperglycemia: Secondary | ICD-10-CM

## 2024-02-25 NOTE — Telephone Encounter (Signed)
 Requested Prescriptions  Pending Prescriptions Disp Refills   metFORMIN (GLUCOPHAGE-XR) 500 MG 24 hr tablet [Pharmacy Med Name: metFORMIN HCl ER Oral Tablet Extended Release 24 Hour 500 MG] 180 tablet 0    Sig: TAKE 1 TABLET (500 MG TOTAL) IN THE MORNING AND AT BEDTIME.     Endocrinology:  Diabetes - Biguanides Passed - 02/25/2024 12:10 PM      Passed - Cr in normal range and within 360 days    Creatinine  Date Value Ref Range Status  05/08/2013 0.77 0.60 - 1.30 mg/dL Final   Creatinine, Ser  Date Value Ref Range Status  02/10/2024 0.71 0.57 - 1.00 mg/dL Final         Passed - HBA1C is between 0 and 7.9 and within 180 days    Hgb A1c MFr Bld  Date Value Ref Range Status  02/10/2024 7.0 (H) 4.8 - 5.6 % Final    Comment:             Prediabetes: 5.7 - 6.4          Diabetes: >6.4          Glycemic control for adults with diabetes: <7.0          Passed - eGFR in normal range and within 360 days    EGFR (African American)  Date Value Ref Range Status  05/08/2013 >60  Final   GFR calc Af Amer  Date Value Ref Range Status  05/16/2020 90 >59 mL/min/1.73 Final    Comment:    **[facility] currently reports eGFR in compliance with the current**   recommendations of the [employer]. [facility] will   update reporting as new guidelines are published from the NKF-ASN   Task force.    EGFR (Non-African Amer.)  Date Value Ref Range Status  05/08/2013 >60  Final    Comment:    eGFR values <49mL/min/1.73 m2 may be an indication of chronic kidney disease (CKD). Calculated eGFR is useful in patients with stable renal function. The eGFR calculation will not be reliable in acutely ill patients when serum creatinine is changing rapidly. It is not useful in  patients on dialysis. The eGFR calculation may not be applicable to patients at the low and high extremes of body sizes, pregnant women, and vegetarians.    GFR calc non Af Amer  Date Value Ref Range Status  05/16/2020  78 >59 mL/min/1.73 Final   eGFR  Date Value Ref Range Status  02/10/2024 100 >59 mL/min/1.73 Final         Passed - B12 Level in normal range and within 720 days    Vitamin B-12  Date Value Ref Range Status  07/24/2023 355 232 - 1,245 pg/mL Final         Passed - Valid encounter within last 6 months    Recent Outpatient Visits           2 weeks ago Encounter for annual physical exam   [facility] [place], Stan Eans, MD              Passed - CBC within normal limits and completed in the last 12 months    WBC  Date Value Ref Range Status  07/24/2023 8.1 3.4 - 10.8 x10E3/uL Final  07/18/2017 7.9 3.6 - 11.0 K/uL Final   RBC  Date Value Ref Range Status  07/24/2023 5.31 (H) 3.77 - 5.28 x10E6/uL Final  07/18/2017 4.57 3.80 - 5.20 MIL/uL Final   Hemoglobin  Date Value Ref Range Status  07/24/2023 13.8 11.1 - 15.9 g/dL Final   Hematocrit  Date Value Ref Range Status  07/24/2023 43.4 34.0 - 46.6 % Final   MCHC  Date Value Ref Range Status  07/24/2023 31.8 31.5 - 35.7 g/dL Final  54/07/8118 14.7 32.0 - 36.0 g/dL Final   [facility]  Date Value Ref Range Status  07/24/2023 26.0 (L) 26.6 - 33.0 pg Final  07/18/2017 27.4 26.0 - 34.0 pg Final   MCV  Date Value Ref Range Status  07/24/2023 82 79 - 97 fL Final  05/08/2013 80 80 - 100 fL Final   No results found for: "PLTCOUNTKUC", "LABPLAT", "POCPLA" RDW  Date Value Ref Range Status  07/24/2023 12.6 11.7 - 15.4 % Final  05/08/2013 14.2 11.5 - 14.5 % Final

## 2024-02-27 ENCOUNTER — Other Ambulatory Visit: Payer: Self-pay | Admitting: Family Medicine

## 2024-02-27 DIAGNOSIS — I10 Essential (primary) hypertension: Secondary | ICD-10-CM

## 2024-02-27 NOTE — Telephone Encounter (Signed)
 Requested Prescriptions  Pending Prescriptions Disp Refills   lisinopril  (ZESTRIL ) 10 MG tablet [Pharmacy Med Name: Lisinopril  Oral Tablet 10 MG] 90 tablet 1    Sig: TAKE 1 TABLET EVERY DAY     Cardiovascular:  ACE Inhibitors Passed - 02/27/2024  5:18 PM      Passed - Cr in normal range and within 180 days    Creatinine  Date Value Ref Range Status  05/08/2013 0.77 0.60 - 1.30 mg/dL Final   Creatinine, Ser  Date Value Ref Range Status  02/10/2024 0.71 0.57 - 1.00 mg/dL Final         Passed - K in normal range and within 180 days    Potassium  Date Value Ref Range Status  02/10/2024 4.4 3.5 - 5.2 mmol/L Final  05/08/2013 3.8 3.5 - 5.1 mmol/L Final         Passed - Patient is not pregnant      Passed - Last BP in normal range    BP Readings from Last 1 Encounters:  02/10/24 122/78         Passed - Valid encounter within last 6 months    Recent Outpatient Visits           2 weeks ago Encounter for annual physical exam   [facility] [place], Jon HERO, MD

## 2024-03-13 ENCOUNTER — Other Ambulatory Visit: Payer: Self-pay | Admitting: Family Medicine

## 2024-03-14 DIAGNOSIS — G4733 Obstructive sleep apnea (adult) (pediatric): Secondary | ICD-10-CM | POA: Diagnosis not present

## 2024-04-14 DIAGNOSIS — G4733 Obstructive sleep apnea (adult) (pediatric): Secondary | ICD-10-CM | POA: Diagnosis not present

## 2024-04-22 ENCOUNTER — Telehealth: Payer: Self-pay | Admitting: Family Medicine

## 2024-04-22 DIAGNOSIS — E119 Type 2 diabetes mellitus without complications: Secondary | ICD-10-CM

## 2024-04-22 MED ORDER — TRUE METRIX BLOOD GLUCOSE TEST VI STRP: ORAL_STRIP | 3 refills | Status: AC

## 2024-04-22 NOTE — Telephone Encounter (Signed)
 Request sent over to pharmacy.

## 2024-04-22 NOTE — Addendum Note (Signed)
 Addended by: Selinda Dales on: 04/22/2024 04:16 PM   Modules accepted: Orders

## 2024-04-22 NOTE — Telephone Encounter (Signed)
 [facility] pharmacy faxed refill request for the following medications:   TRUE METRIX BLOOD GLUCOSE TEST test strip    Please advise

## 2024-04-30 ENCOUNTER — Ambulatory Visit: Payer: Self-pay

## 2024-04-30 NOTE — Telephone Encounter (Signed)
 FYI Only or Action Required?: FYI only for provider.  Patient was last seen in primary care on 02/10/2024 by Mazie Speed, MD. Called Nurse Triage reporting Foot Swelling. Symptoms began a week ago. Interventions attempted: Nothing. Symptoms are: unchanged.  Triage Disposition: See PCP When Office is Open (Within 3 Days)  Patient/caregiver understands and will follow disposition?: Yes  **Pt. Was offered an appointment however due to lack of availability patient advised to be seen in UC, she declined due to transportation. Pt. Scheduled with provider on 7/25. She will seek care if symptoms worsen.                        Copied from CRM [id number]. Topic: Clinical - Red Word Triage >> Apr 30, 2024  4:21 PM Everette C wrote: [facility] that prompted transfer to Nurse Triage: The patient is currently experiencing swelling and discomfort in their feet Reason for Disposition  [1] MILD swelling of both ankles (i.e., pedal edema) AND [2] new-onset or worsening  Answer Assessment - Initial Assessment Questions 1. ONSET: When did the swelling start? (e.g., minutes, hours, days)     X 1 week   2. LOCATION: What part of the leg is swollen?  Are both legs swollen or just one leg?    BIL foot swelling  3. SEVERITY: How bad is the swelling? (e.g., localized; mild, moderate, severe)   - Localized: Small area of swelling localized to one leg.   - MILD pedal edema: Swelling limited to foot and ankle, pitting edema < 1/4 inch (6 mm) deep, rest and elevation eliminate most or all swelling.   - MODERATE edema: Swelling of lower leg to knee, pitting edema > 1/4 inch (6 mm) deep, rest and elevation only partially reduce swelling.   - SEVERE edema: Swelling extends above knee, facial or hand swelling present.      Mild   4. REDNESS: Does the swelling look red or infected?     No   5. PAIN: Is the swelling painful to touch? If Yes, ask: How painful is it?   (Scale 1-10;  mild, moderate or severe)     3/10   6. FEVER: Do you have a fever? If Yes, ask: What is it, how was it measured, and when did it start?     No  7. CAUSE: What do you think is causing the leg swelling?     Unknown, new for patient   8. MEDICAL HISTORY: Do you have a history of blood clots (e.g., DVT), cancer, heart failure, kidney disease, or liver failure?     No, diabetes   9. RECURRENT SYMPTOM: Have you had leg swelling before? If Yes, ask: When was the last time? What happened that time?     No   10. OTHER SYMPTOMS: Do you have any other symptoms? (e.g., chest pain, difficulty breathing)       No     Pt. Has hx. Of arthritis, pain with walking. When she walks the pain increased to a 3-4/10, For home care she has not taken anything OTC for the mild pain.  Protocols used: Leg Swelling and Edema-A-AH

## 2024-05-03 NOTE — Telephone Encounter (Signed)
 Noted

## 2024-05-07 ENCOUNTER — Encounter

## 2024-05-11 ENCOUNTER — Other Ambulatory Visit: Payer: Self-pay | Admitting: Family Medicine

## 2024-05-11 DIAGNOSIS — E1165 Type 2 diabetes mellitus with hyperglycemia: Secondary | ICD-10-CM

## 2024-05-14 DIAGNOSIS — G4733 Obstructive sleep apnea (adult) (pediatric): Secondary | ICD-10-CM | POA: Diagnosis not present

## 2024-05-20 ENCOUNTER — Other Ambulatory Visit: Payer: Self-pay | Admitting: Family Medicine

## 2024-05-20 DIAGNOSIS — E119 Type 2 diabetes mellitus without complications: Secondary | ICD-10-CM

## 2024-05-21 ENCOUNTER — Ambulatory Visit
Admission: RE | Admit: 2024-05-21 | Discharge: 2024-05-21 | Disposition: A | Discharge status: Home / Self Care | Source: Ambulatory Visit | Attending: Family Medicine

## 2024-05-21 DIAGNOSIS — Z1231 Encounter for screening mammogram for malignant neoplasm of breast: Secondary | ICD-10-CM | POA: Insufficient documentation

## 2024-05-27 ENCOUNTER — Ambulatory Visit: Payer: Self-pay | Admitting: Family Medicine

## 2024-06-04 ENCOUNTER — Ambulatory Visit: Admitting: Family Medicine

## 2024-06-04 DIAGNOSIS — H524 Presbyopia: Secondary | ICD-10-CM | POA: Diagnosis not present

## 2024-06-04 DIAGNOSIS — H5213 Myopia, bilateral: Secondary | ICD-10-CM | POA: Diagnosis not present

## 2024-06-04 DIAGNOSIS — E119 Type 2 diabetes mellitus without complications: Secondary | ICD-10-CM | POA: Diagnosis not present

## 2024-06-14 DIAGNOSIS — G4733 Obstructive sleep apnea (adult) (pediatric): Secondary | ICD-10-CM | POA: Diagnosis not present

## 2024-07-07 ENCOUNTER — Other Ambulatory Visit: Payer: Self-pay

## 2024-07-07 ENCOUNTER — Ambulatory Visit: Payer: Self-pay

## 2024-07-07 DIAGNOSIS — E119 Type 2 diabetes mellitus without complications: Secondary | ICD-10-CM

## 2024-07-07 MED ORDER — TRUE METRIX METER W/DEVICE KIT: PACK | 0 refills | Status: DC

## 2024-07-07 MED ORDER — FREESTYLE LIBRE 3 PLUS SENSOR MISC: 1 refills | Status: DC

## 2024-07-07 NOTE — Telephone Encounter (Signed)
 FYI Only or Action Required?: Action required by provider: clinical question for provider.  Patient was last seen in primary care on 02/10/2024 by Myrla Jon HERO, MD.  Called Nurse Triage reporting No chief complaint on file..  Triage Disposition: Discuss With PCP and Callback by Nurse Today (overriding Call PCP When Office is Open)  Patient/caregiver understands and will follow disposition?: Yes Reason for Disposition  [1] Caller requesting NON-URGENT health information AND [2] PCP's office is the best resource  Answer Assessment - Initial Assessment Questions 1. REASON FOR CALL: What is the main reason for your call? or How can I best help you?   Patient reports that her glucometer is giving her false readings and is requesting new glucometer as it is several years old. Patient would like to receive a CGM instead.  States she is getting readings at between 120 and 170 fasted. But then she said she is checking it after drinking sweet tea and soda.  This RN provided dietary education including avoiding high sugar drinks like sweet tea and soda to patient including potential complications from blood sugar that is not well controlled.  Protocols used: Information Only Call - No Triage-A-AH Copied from CRM [id number]. Topic: Clinical - Red Word Triage >> Jul 07, 2024 10:02 AM Charlet HERO wrote: Red Word that prompted transfer to Nurse Triage: Patient is calling about a new gluclose monitor she is stating that the one she has is old and thinks it is giving her false readings.

## 2024-07-07 NOTE — Addendum Note (Signed)
 Addended by: LILIAN SEVERO RAMAN on: 07/07/2024 04:29 PM   Modules accepted: Orders

## 2024-07-07 NOTE — Progress Notes (Signed)
 Opened in error

## 2024-07-07 NOTE — Telephone Encounter (Signed)
 Pt advised. Verbalized understanding and reports she would actually like to see if she could have a rx for CGM. Please advise

## 2024-07-07 NOTE — Progress Notes (Signed)
 Original order continues to print

## 2024-07-07 NOTE — Addendum Note (Signed)
 Addended by: LILIAN SEVERO RAMAN on: 07/07/2024 02:23 PM   Modules accepted: Orders

## 2024-07-08 NOTE — Telephone Encounter (Signed)
 Pt advised. Verbalized understanding.

## 2024-07-14 ENCOUNTER — Other Ambulatory Visit (HOSPITAL_COMMUNITY): Payer: Self-pay

## 2024-07-14 ENCOUNTER — Telehealth: Payer: Self-pay

## 2024-07-14 NOTE — Telephone Encounter (Signed)
 Pharmacy Patient Advocate Encounter   Received notification from Onbase that prior authorization for Freestyle Libre 3 CGM supplies is required/requested.   Insurance verification completed.   The patient is insured through [facility] Medicare Part D.   Per test claim: PA is required, medicare part D requires that pt be insulin dependent and/or have documented hypoglycemic events.

## 2024-07-15 ENCOUNTER — Telehealth: Payer: Self-pay

## 2024-07-15 MED ORDER — BLOOD GLUCOSE MONITORING SUPPL DEVI: 1.0000 | Freq: Three times a day (TID) | 0 refills | Status: AC

## 2024-07-15 MED ORDER — LANCET DEVICE MISC
1.0000 | Freq: Three times a day (TID) | 0 refills | Status: AC
Start: 2024-07-15 — End: 2024-08-14

## 2024-07-15 MED ORDER — BLOOD GLUCOSE TEST VI STRP
1.0000 | ORAL_STRIP | Freq: Three times a day (TID) | 0 refills | Status: DC
Start: 2024-07-15 — End: 2024-08-23

## 2024-07-15 MED ORDER — LANCETS MISC. MISC: 1.0000 | Freq: Three times a day (TID) | 0 refills | Status: AC

## 2024-07-15 NOTE — Telephone Encounter (Signed)
 I'm just relaying what PA team is telling us . Maybe we can clarify with them... it seems that it is covered but only if she has hypoglycemia or is insulin-dependent. After clarification, we can try to verbally explain this better to the patient.

## 2024-07-15 NOTE — Telephone Encounter (Signed)
 Copied from CRM [id number]. Topic: Clinical - Medication Prior Auth >> Jul 15, 2024 12:26 PM DeAngela L wrote: Reason for CRM: [place] calling with [employer], for cause the Patient would like to ask if her provider could submit a Prior Auth for a CGM Blood Glucose Monitoring   [employer] phone num [phone] Fax num [phone]

## 2024-07-15 NOTE — Telephone Encounter (Signed)
 Called and spoke to the pt, its been explained the reason her insurance may not cover the CGM, she stated it was fine but still wanted the meter to be sent. She has been informed the meter has been sent to her local pharmacy.

## 2024-07-15 NOTE — Telephone Encounter (Signed)
See my chart message regarding the same issue

## 2024-07-15 NOTE — Telephone Encounter (Unsigned)
 Copied from CRM [id number]. Topic: Clinical - Medication Prior Auth >> Jul 15, 2024 12:39 PM Tiffany S wrote: Reason for CRM: Patient stated she spoke with her insurance company and they told her they do cover the CGM meter please follow up patient

## 2024-07-15 NOTE — Addendum Note (Signed)
 Addended by: Saladin Petrelli D on: 07/15/2024 02:34 PM   Modules accepted: Orders

## 2024-07-15 NOTE — Telephone Encounter (Signed)
 Please see the message from the pt

## 2024-07-15 NOTE — Telephone Encounter (Signed)
 Medicare will not cover CGM, so she will need to do regular glucometer. Ok to send supplies for the one requested

## 2024-07-22 ENCOUNTER — Other Ambulatory Visit: Payer: Self-pay | Admitting: Family Medicine

## 2024-07-22 DIAGNOSIS — I10 Essential (primary) hypertension: Secondary | ICD-10-CM

## 2024-08-03 ENCOUNTER — Telehealth: Payer: Self-pay

## 2024-08-03 NOTE — Telephone Encounter (Signed)
 Copied from CRM #[id number]. Topic: Clinical - Order For Equipment >> Aug 02, 2024  2:42 PM Corin V wrote: Reason for CRM: Patient is wanting to return her CPAP machine to [facility]. They are needed a discontinuation order to process the return. Please fax order to: [phone].  Patient wants to return due to not liking it and not having used it recently and she wants to stop being billed for the rental of one.  Please callback [phone] once completed or with any questions.

## 2024-08-03 NOTE — Telephone Encounter (Signed)
 Ok to send discontinuation order for CPAP to [facility]. I can sign a paper Rx when it's ready

## 2024-08-12 ENCOUNTER — Ambulatory Visit: Admitting: Family Medicine

## 2024-08-12 ENCOUNTER — Encounter: Payer: Self-pay | Admitting: Family Medicine

## 2024-08-12 VITALS — BP 128/61 | HR 83 | Ht 62.0 in | Wt 241.1 lb

## 2024-08-12 DIAGNOSIS — M25511 Pain in right shoulder: Secondary | ICD-10-CM | POA: Diagnosis not present

## 2024-08-12 DIAGNOSIS — E559 Vitamin D deficiency, unspecified: Secondary | ICD-10-CM

## 2024-08-12 DIAGNOSIS — E1159 Type 2 diabetes mellitus with other circulatory complications: Secondary | ICD-10-CM

## 2024-08-12 DIAGNOSIS — E1169 Type 2 diabetes mellitus with other specified complication: Secondary | ICD-10-CM

## 2024-08-12 DIAGNOSIS — G4733 Obstructive sleep apnea (adult) (pediatric): Secondary | ICD-10-CM

## 2024-08-12 DIAGNOSIS — Z23 Encounter for immunization: Secondary | ICD-10-CM | POA: Diagnosis not present

## 2024-08-12 DIAGNOSIS — E119 Type 2 diabetes mellitus without complications: Secondary | ICD-10-CM

## 2024-08-12 DIAGNOSIS — I152 Hypertension secondary to endocrine disorders: Secondary | ICD-10-CM | POA: Diagnosis not present

## 2024-08-12 DIAGNOSIS — R42 Dizziness and giddiness: Secondary | ICD-10-CM | POA: Diagnosis not present

## 2024-08-12 DIAGNOSIS — I10 Essential (primary) hypertension: Secondary | ICD-10-CM | POA: Diagnosis not present

## 2024-08-12 DIAGNOSIS — E1165 Type 2 diabetes mellitus with hyperglycemia: Secondary | ICD-10-CM

## 2024-08-12 DIAGNOSIS — E785 Hyperlipidemia, unspecified: Secondary | ICD-10-CM | POA: Diagnosis not present

## 2024-08-12 DIAGNOSIS — F332 Major depressive disorder, recurrent severe without psychotic features: Secondary | ICD-10-CM

## 2024-08-12 DIAGNOSIS — Z6841 Body Mass Index (BMI) 40.0 and over, adult: Secondary | ICD-10-CM

## 2024-08-12 DIAGNOSIS — N951 Menopausal and female climacteric states: Secondary | ICD-10-CM

## 2024-08-12 MED ORDER — ALCOHOL SWABS 70 % PADS: MEDICATED_PAD | 3 refills | Status: AC

## 2024-08-12 MED ORDER — TIRZEPATIDE 5 MG/0.5ML ~~LOC~~ SOAJ: 5.0000 mg | SUBCUTANEOUS | 1 refills | Status: DC

## 2024-08-12 MED ORDER — TIRZEPATIDE 2.5 MG/0.5ML ~~LOC~~ SOAJ: 2.5000 mg | SUBCUTANEOUS | 0 refills | Status: DC

## 2024-08-12 MED ORDER — TRIAMCINOLONE ACETONIDE 0.5 % EX OINT: 1.0000 | TOPICAL_OINTMENT | Freq: Two times a day (BID) | CUTANEOUS | 1 refills | Status: DC | PRN

## 2024-08-12 NOTE — Assessment & Plan Note (Signed)
 Type 2 diabetes mellitus with recent A1c of 7.0. Reports weight gain and is interested in weight management and improved glycemic control. Discussed Mounjaro, a once-weekly injection that suppresses appetite, lowers blood sugar, and aids in weight loss. - Initiate Mounjaro 2.5 mg weekly for one month, then increase to 5 mg weekly - Continue current medications including Jardiance  and metformin  - Recheck A1c, cholesterol, kidney and liver function, and vitamin D  levels today

## 2024-08-12 NOTE — Progress Notes (Signed)
 Established patient visit   Patient: Melinda [name]   DOB: 05-Jun-1971   53 y.o. Female  MRN: [id number] Visit Date: 08/12/2024  Today's healthcare provider: Jon Eva, MD   Chief Complaint  Patient presents with   Medical Management of Chronic Issues   Diabetes    Monitors at home with range of 120-160 mg/dL fasting. She reports some hypoglycemic episodes typically when she has went too long without eating with symptoms of weakness, fatigue and once she gets a upset stomach. She reports no symptoms. Last eye exam completed 1-2 months ago at [facility] with provider Aureliano   Hyperlipidemia   Hypertension    Patient dos not monitor. No smoking. General diet. No exercise. Lower leg edema a few months ago, light headed only symptoms reported.    Subjective    Diabetes  Hyperlipidemia  Hypertension   HPI     Diabetes    Additional comments: Monitors at home with range of 120-160 mg/dL fasting. She reports some hypoglycemic episodes typically when she has went too long without eating with symptoms of weakness, fatigue and once she gets a upset stomach. She reports no symptoms. Last eye exam completed 1-2 months ago at [facility] with provider Aureliano        Hypertension    Additional comments: Patient dos not monitor. No smoking. General diet. No exercise. Lower leg edema a few months ago, light headed only symptoms reported.       Last edited by Lilian Fitzpatrick, CMA on 08/12/2024 10:23 AM.       Discussed the use of AI scribe software for clinical note transcription with the patient, who gave verbal consent to proceed.  History of Present Illness   Melinda [name] is a 53 year old female with diabetes who presents with shoulder pain and lightheadedness.  She has bilateral shoulder pain for two to three months, exacerbated by overhead motion and reaching behind her back. Ibuprofen and gabapentin  have been ineffective. She experiences  occasional lightheadedness without a clear pattern, occurring both while sitting and standing.  She has severe hot flashes due to menopause, with no relief from gabapentin  or Lexapro . Her weight has increased by ten pounds, from 230 to 240 pounds, without dietary changes. Her last A1c was 7.0, and she is on Jardiance  and metformin  for diabetes management.  She has persistent itching and sores from bug bites around her ankle, which she continues to scratch. She has stopped using her CPAP machine, feeling it was not beneficial. Her current medications include Eliquis, hydralazine , and potassium supplements. She maintains a nocturnal schedule, eating breakfast at 3 or 4 AM, skipping lunch, and having dinner.         Medications: Outpatient Medications Prior to Visit  Medication Sig   atorvastatin  (LIPITOR) 10 MG tablet TAKE 1 TABLET EVERY DAY   Blood Glucose Monitoring Suppl (TRUE METRIX METER) w/Device KIT Use to check fasting blood sugar daily   Blood Glucose Monitoring Suppl DEVI 1 each by Does not apply route in the morning, at noon, and at bedtime. May substitute to any manufacturer covered by patient's insurance.   buPROPion  (WELLBUTRIN  XL) 300 MG 24 hr tablet TAKE 1 TABLET EVERY DAY   empagliflozin  (JARDIANCE ) 25 MG TABS tablet Take 1 tablet (25 mg total) by mouth daily.   escitalopram  (LEXAPRO ) 20 MG tablet TAKE 1 TABLET EVERY DAY   Glucose Blood (BLOOD GLUCOSE TEST STRIPS) STRP 1 each by In Vitro route  in the morning, at noon, and at bedtime. May substitute to any manufacturer covered by patient's insurance.   Lancet Device MISC 1 each by Does not apply route in the morning, at noon, and at bedtime. May substitute to any manufacturer covered by patient's insurance.   Lancets Misc. MISC 1 each by Does not apply route in the morning, at noon, and at bedtime. May substitute to any manufacturer covered by patient's insurance.   lisinopril  (ZESTRIL ) 10 MG tablet TAKE 1 TABLET EVERY DAY    metFORMIN  (GLUCOPHAGE -XR) 500 MG 24 hr tablet TAKE 1 TABLET (500 MG TOTAL) IN THE MORNING AND AT BEDTIME.   TRUE METRIX BLOOD GLUCOSE TEST test strip Check fasting blood sugar once daily   TRUEplus Lancets 33G MISC USE TO CHECK BLOOD GLUCOSE LEVEL ONE TIME DAILY.   Vitamin D , Ergocalciferol , (DRISDOL ) 1.25 MG (50000 UNIT) CAPS capsule Take 1 capsule (50,000 Units total) by mouth every 7 (seven) days.   ziprasidone  (GEODON ) 80 MG capsule TAKE 1 CAPSULE TWICE DAILY   [DISCONTINUED] Continuous Glucose Sensor (FREESTYLE LIBRE 3 PLUS SENSOR) MISC Change sensor every 15 days.Change sensor every 15 days.   [DISCONTINUED] gabapentin  (NEURONTIN ) 300 MG capsule TAKE 1 CAPSULE AT BEDTIME   No facility-administered medications prior to visit.    Review of Systems     Objective    BP 128/61 (BP Location: Left Arm, Patient Position: Sitting, Cuff Size: Normal)   Pulse 83   Ht 5' 2 (1.575 m)   Wt 241 lb 1.6 oz (109.4 kg)   LMP  (LMP Unknown) Comment: pt states she has not had period in the past year  SpO2 100%   BMI 44.10 kg/m  Wt Readings from Last 3 Encounters:  08/12/24 241 lb 1.6 oz (109.4 kg)  02/10/24 230 lb 8 oz (104.6 kg)  12/17/23 230 lb (104.3 kg)      Physical Exam Vitals reviewed.  Constitutional:      General: She is not in acute distress.    Appearance: Normal appearance. She is well-developed. She is not diaphoretic.  HENT:     Head: Normocephalic and atraumatic.  Eyes:     General: No scleral icterus.    Conjunctiva/sclera: Conjunctivae normal.  Neck:     Thyroid : No thyromegaly.  Cardiovascular:     Rate and Rhythm: Normal rate and regular rhythm.     Heart sounds: Normal heart sounds. No murmur heard. Pulmonary:     Effort: Pulmonary effort is normal. No respiratory distress.     Breath sounds: Normal breath sounds. No wheezing, rhonchi or rales.  Musculoskeletal:     Cervical back: Neck supple.     Right lower leg: No edema.     Left lower leg: No edema.   Lymphadenopathy:     Cervical: No cervical adenopathy.  Skin:    General: Skin is warm and dry.     Findings: No rash.  Neurological:     Mental Status: She is alert and oriented to person, place, and time. Mental status is at baseline.  Psychiatric:        Mood and Affect: Mood normal.        Behavior: Behavior normal.      No results found for any visits on 08/12/24.  Assessment & Plan     Problem List Items Addressed This Visit       Cardiovascular and Mediastinum   Hypertension associated with diabetes (HCC)   Well controlled Continue current medications Recheck metabolic panel  Relevant Medications   tirzepatide (MOUNJARO) 2.5 MG/0.5ML Pen   tirzepatide (MOUNJARO) 5 MG/0.5ML Pen   Other Relevant Orders   Comprehensive metabolic panel with GFR   Hot flashes due to menopause     Respiratory   Apnea, sleep   No longer on CPAP - d/c'd due to not helping        Endocrine   T2DM (type 2 diabetes mellitus) (HCC)   Type 2 diabetes mellitus with recent A1c of 7.0. Reports weight gain and is interested in weight management and improved glycemic control. Discussed Mounjaro, a once-weekly injection that suppresses appetite, lowers blood sugar, and aids in weight loss. - Initiate Mounjaro 2.5 mg weekly for one month, then increase to 5 mg weekly - Continue current medications including Jardiance  and metformin  - Recheck A1c, cholesterol, kidney and liver function, and vitamin D  levels today      Relevant Medications   tirzepatide (MOUNJARO) 2.5 MG/0.5ML Pen   tirzepatide (MOUNJARO) 5 MG/0.5ML Pen   Hyperlipidemia associated with type 2 diabetes mellitus (HCC)   Continue statin Recheck CMP/lipids      Relevant Medications   tirzepatide (MOUNJARO) 2.5 MG/0.5ML Pen   tirzepatide (MOUNJARO) 5 MG/0.5ML Pen   Other Relevant Orders   Lipid Panel With LDL/HDL Ratio     Other   Morbid obesity with BMI of 40.0-44.9, adult (HCC) (Chronic)   Relevant Medications    tirzepatide (MOUNJARO) 2.5 MG/0.5ML Pen   tirzepatide (MOUNJARO) 5 MG/0.5ML Pen   Major depressive disorder, recurrent severe without psychotic features (HCC)   Current medications include Geodon , Lexapro , and bupropion . She reports being in a good place with current treatment regimen.  F/b Psych - Continue Geodon  80 mg twice daily - Continue Lexapro  20 mg daily - Continue bupropion  XL 300 mg daily      Other Visit Diagnoses       Essential hypertension    -  Primary   Relevant Orders   Comprehensive metabolic panel with GFR     Controlled type 2 diabetes mellitus without complication, without long-term current use of insulin (HCC)       Relevant Medications   tirzepatide (MOUNJARO) 2.5 MG/0.5ML Pen   tirzepatide (MOUNJARO) 5 MG/0.5ML Pen   Other Relevant Orders   Hemoglobin A1c     Avitaminosis D       Relevant Orders   VITAMIN D  25 Hydroxy (Vit-D Deficiency, Fractures)     Immunization due       Relevant Orders   Flu vaccine trivalent PF, 6mos and older(Flulaval,Afluria,Fluarix,Fluzone)   Pneumococcal conjugate vaccine 20-valent     Acute pain of both shoulders       Relevant Orders   DG Shoulder Right   DG Shoulder Left   Ambulatory referral to Orthopedic Surgery     Lightheadedness              Weight gain Recent weight gain of 10 pounds without changes in diet or medication. Discussed potential benefit of Mounjaro for weight management. - Initiate Mounjaro for weight management and glycemic control  Bilateral shoulder pain with limited range of motion Bilateral shoulder pain with limited range of motion, particularly with overhead motion and reaching behind the back. Symptoms have persisted for 2-3 months. Ibuprofen and gabapentin  have not provided relief. Discussed referral to orthopedist or sports medicine for further evaluation and management. - Order shoulder x-rays - Refer to orthopedist for further evaluation and management  Menopausal symptoms (hot  flashes) Severe hot flashes persist despite use  of gabapentin  and Lexapro . Discussed potential for hormone therapy and referral to gynecologist for further evaluation. Hormone therapy has pros and cons, including a previously debunked study linking it to increased breast cancer risk. - Discuss potential referral to gynecologist for hormone therapy if interested  Lightheadedness Intermittent lightheadedness without clear pattern. Blood pressure is well-controlled today. Discussed potential causes including low blood sugar and blood pressure changes. - Instruct to keep a log of lightheadedness episodes to identify potential patterns  Vitamin D  deficiency Vitamin D  deficiency. Plan to recheck levels today. - Recheck vitamin D  levels today  Pruritic insect bites with secondary excoriation, right ankle Pruritic insect bites on right ankle with secondary excoriation. Persistent itching noted. - Prescribe triamcinolone cream for itching       Return in about 3 months (around 11/12/2024) for chronic disease f/u.       Jon Eva, MD  [facility] [phone] (phone) [phone] (fax)  [facility]

## 2024-08-12 NOTE — Assessment & Plan Note (Signed)
 Current medications include Geodon , Lexapro , and bupropion . She reports being in a good place with current treatment regimen.  F/b Psych - Continue Geodon  80 mg twice daily - Continue Lexapro  20 mg daily - Continue bupropion  XL 300 mg daily

## 2024-08-12 NOTE — Assessment & Plan Note (Signed)
 Continue statin Recheck CMP/lipids

## 2024-08-12 NOTE — Assessment & Plan Note (Signed)
 No longer on CPAP - d/c'd due to not helping

## 2024-08-12 NOTE — Assessment & Plan Note (Signed)
 Well controlled Continue current medications Recheck metabolic panel

## 2024-08-13 LAB — COMPREHENSIVE METABOLIC PANEL WITH GFR
ALT: 14 IU/L (ref 0–32)
AST: 12 IU/L (ref 0–40)
Albumin: 4.3 g/dL (ref 3.8–4.9)
Alkaline Phosphatase: 179 IU/L — ABNORMAL HIGH (ref 49–135)
BUN/Creatinine Ratio: 18 (ref 9–23)
BUN: 13 mg/dL (ref 6–24)
Bilirubin Total: <0.2 mg/dL (ref 0.0–1.2)
CO2: 23 mmol/L (ref 20–29)
Calcium: 9.6 mg/dL (ref 8.7–10.2)
Chloride: 102 mmol/L (ref 96–106)
Creatinine, Ser: 0.72 mg/dL (ref 0.57–1.00)
Globulin, Total: 2.8 g/dL (ref 1.5–4.5)
Glucose: 154 mg/dL — ABNORMAL HIGH (ref 70–99)
Potassium: 4.3 mmol/L (ref 3.5–5.2)
Sodium: 140 mmol/L (ref 134–144)
Total Protein: 7.1 g/dL (ref 6.0–8.5)
eGFR: 99 mL/min/1.73 (ref >59)

## 2024-08-13 LAB — LIPID PANEL WITH LDL/HDL RATIO
Cholesterol, Total: 164 mg/dL (ref 100–199)
HDL: 42 mg/dL (ref >39)
LDL Chol Calc (NIH): 78 mg/dL (ref 0–99)
LDL/HDL Ratio: 1.9 ratio (ref 0.0–3.2)
Triglycerides: 267 mg/dL — ABNORMAL HIGH (ref 0–149)
VLDL Cholesterol Cal: 44 mg/dL — ABNORMAL HIGH (ref 5–40)

## 2024-08-13 LAB — VITAMIN D 25 HYDROXY (VIT D DEFICIENCY, FRACTURES): Vit D, 25-Hydroxy: 12.6 ng/mL — ABNORMAL LOW (ref 30.0–100.0)

## 2024-08-13 LAB — HEMOGLOBIN A1C
Est. average glucose Bld gHb Est-mCnc: 186 mg/dL
Hgb A1c MFr Bld: 8.1 % — ABNORMAL HIGH (ref 4.8–5.6)

## 2024-08-16 ENCOUNTER — Ambulatory Visit: Payer: Self-pay | Admitting: Family Medicine

## 2024-08-16 ENCOUNTER — Other Ambulatory Visit: Payer: Self-pay

## 2024-08-16 DIAGNOSIS — E559 Vitamin D deficiency, unspecified: Secondary | ICD-10-CM

## 2024-08-16 MED ORDER — TIRZEPATIDE 7.5 MG/0.5ML ~~LOC~~ SOAJ: 7.5000 mg | SUBCUTANEOUS | 1 refills | Status: AC

## 2024-08-16 MED ORDER — VITAMIN D (ERGOCALCIFEROL) 1.25 MG (50000 UNIT) PO CAPS: 50000.0000 [IU] | ORAL_CAPSULE | ORAL | 0 refills | Status: DC

## 2024-08-18 ENCOUNTER — Ambulatory Visit

## 2024-08-19 ENCOUNTER — Telehealth: Payer: Self-pay

## 2024-08-19 NOTE — Telephone Encounter (Signed)
 Copied from CRM #[id number]. Topic: Clinical - Medication Question >> Aug 18, 2024  3:58 PM Nathanel BROCKS wrote: Reason for CRM: pt received isulin today. Should she stop taking the pills and only take the shot or does she need to take both.. Please call pt and advise.

## 2024-08-19 NOTE — Telephone Encounter (Signed)
 Pt advised. Verbalized understanding.

## 2024-08-23 ENCOUNTER — Other Ambulatory Visit: Payer: Self-pay | Admitting: Family Medicine

## 2024-08-23 ENCOUNTER — Ambulatory Visit

## 2024-08-23 ENCOUNTER — Telehealth: Payer: Self-pay

## 2024-08-23 DIAGNOSIS — M7552 Bursitis of left shoulder: Secondary | ICD-10-CM

## 2024-08-23 DIAGNOSIS — M19012 Primary osteoarthritis, left shoulder: Secondary | ICD-10-CM | POA: Diagnosis not present

## 2024-08-23 DIAGNOSIS — M25511 Pain in right shoulder: Secondary | ICD-10-CM

## 2024-08-23 DIAGNOSIS — M7551 Bursitis of right shoulder: Secondary | ICD-10-CM

## 2024-08-23 DIAGNOSIS — M25512 Pain in left shoulder: Secondary | ICD-10-CM | POA: Diagnosis not present

## 2024-08-23 MED ORDER — LIDOCAINE HCL 1 % IJ SOLN
4.0000 mL | INTRAMUSCULAR | Status: AC | PRN
  Administered 2024-08-23: 4 mL

## 2024-08-23 NOTE — Telephone Encounter (Signed)
 Filled and mailed pt portion and faxed provider portion today. [employer] trulicity

## 2024-08-23 NOTE — Patient Instructions (Signed)

## 2024-08-23 NOTE — Telephone Encounter (Signed)
 Disregard earlier message. Correct: Filled and mailed pt portion and faxed provider portion today. On bicares jardiance 

## 2024-08-23 NOTE — Progress Notes (Signed)
 Orthopaedic Surgery New Patient Visit   History of Present Illness: The patient is a 53 y.o. right-hand-dominant female seen in clinic for bilateral shoulder pain.  Patient reports her symptoms have been present for the last 2 to 3 months.  No inciting injury or trauma to the shoulders.  She states her right shoulder started before the left.  Symptoms include pain and loss of range of motion secondary to pain.  Her symptoms are better at rest and worse when reaching overhead and across her body.  She also notes stiffness.  Treatments attempted to date include ibuprofen and gabapentin  without significant relief.  She denies numbness or paresthesias in the arms or other complaints today.  Symptoms have affected her quality of life as she is unable to perform activities of daily living as a result.  Patient regularly followed by Dr. Jon Eva MD with [facility]. Last seen on 08/12/2024. Reported bilateral shoulder pain x 2-3 months. No improvement with OTC ibuprofen and Gabapentin .  Patient with non-insulin dependent DM. Currently on Jardiance  and metformin . Last A1c was 8.1 on 08/12/2024 (an increase from 7.0 in April 2025). Last BUN 12, Cr 0.72 and eGFR 99.  Occupation: Disabled   Past Medical, Social and Family History: Past Medical History:  Diagnosis Date   Depression    Diabetes mellitus without complication (HCC)    Diabetic peripheral neuropathy (HCC) 03/07/15   Diabetic peripheral neuropathy (HCC) 03/07/15   Diabetic peripheral neuropathy (HCC) 03/07/15   GERD (gastroesophageal reflux disease)    Hypercholesterolemia 03/07/15   Hypertension    Obesity 03/07/15   Personality disorder (HCC) 03/07/15   Sinus tachycardia 03/07/15   history of   Suicidal thoughts 03/07/15   Past Surgical History:  Procedure Laterality Date   COLONOSCOPY WITH PROPOFOL  N/A 12/01/2018   Procedure: COLONOSCOPY WITH PROPOFOL ;  Surgeon: Therisa Bi, MD;  Location: [facility]  ENDOSCOPY;  Service: Gastroenterology;  Laterality: N/A;   electroconvulsion therapy  03/07/15   Allergies  Allergen Reactions   Prednisone     Increases blood sugar   Current Outpatient Medications on File Prior to Visit  Medication Sig Dispense Refill   Alcohol Swabs 70 % PADS Use to cleanse the skin prior to mounjaro injection 100 each 3   atorvastatin  (LIPITOR) 10 MG tablet TAKE 1 TABLET EVERY DAY 90 tablet 3   Blood Glucose Monitoring Suppl (TRUE METRIX METER) w/Device KIT Use to check fasting blood sugar daily 1 kit 0   Blood Glucose Monitoring Suppl DEVI 1 each by Does not apply route in the morning, at noon, and at bedtime. May substitute to any manufacturer covered by patient's insurance. 1 each 0   buPROPion  (WELLBUTRIN  XL) 300 MG 24 hr tablet TAKE 1 TABLET EVERY DAY 90 tablet 3   empagliflozin  (JARDIANCE ) 25 MG TABS tablet Take 1 tablet (25 mg total) by mouth daily. 90 tablet 1   escitalopram  (LEXAPRO ) 20 MG tablet TAKE 1 TABLET EVERY DAY 90 tablet 3   lisinopril  (ZESTRIL ) 10 MG tablet TAKE 1 TABLET EVERY DAY 90 tablet 3   metFORMIN  (GLUCOPHAGE -XR) 500 MG 24 hr tablet TAKE 1 TABLET (500 MG TOTAL) IN THE MORNING AND AT BEDTIME. 180 tablet 3   tirzepatide (MOUNJARO) 2.5 MG/0.5ML Pen Inject 2.5 mg into the skin once a week. 2 mL 0   tirzepatide (MOUNJARO) 5 MG/0.5ML Pen Inject 5 mg into the skin once a week. 6 mL 1   tirzepatide (MOUNJARO) 7.5 MG/0.5ML Pen Inject 7.5 mg into the  skin once a week. 6 mL 1   triamcinolone ointment (KENALOG) 0.5 % Apply 1 Application topically 2 (two) times daily as needed (itching). 30 g 1   TRUE METRIX BLOOD GLUCOSE TEST test strip Check fasting blood sugar once daily 100 strip 3   TRUEplus Lancets 33G MISC USE TO CHECK BLOOD GLUCOSE LEVEL ONE TIME DAILY. 100 each 3   Vitamin D , Ergocalciferol , (DRISDOL ) 1.25 MG (50000 UNIT) CAPS capsule Take 1 capsule (50,000 Units total) by mouth every 7 (seven) days. 12 capsule 0   ziprasidone  (GEODON ) 80 MG capsule  TAKE 1 CAPSULE TWICE DAILY 180 capsule 3   No current facility-administered medications on file prior to visit.   Social History   Tobacco Use   Smoking status: Never   Smokeless tobacco: Never  Vaping Use   Vaping status: Never Used  Substance Use Topics   Alcohol use: No   Drug use: No      I have reviewed past medical, surgical, social and family history, medications and allergies as documented in the EMR.  Review of Systems - A ROS was performed including pertinent positives and negatives as documented in the HPI.     Physical Exam:  General/Constitutional: NAD Vascular: No edema, swelling or tenderness, except as noted in detailed exam Integumentary: No impressive skin lesions present, except as noted in detailed exam Neuro/Psych: Normal mood and affect, oriented to person, place and time Musculoskeletal: Normal, except as noted in detailed exam and in HPI   Focused examination:  Neck focused exam: Palpation: non-tender to palpation about the mid-line spine and paraspinal musculature ROM: within normal limits in flexion, extension, rotation and side-bending Spurling's negative   Shoulder focused exam: Skin is intact about the bilateral shoulders without erythema or ecchymosis.  No obvious deformity     RIGHT LEFT  Scapula Atrophy None None   Winging None None  Rotator cuff Supraspinatus 5/5 5/5   Infraspinatus 5/5 5/5   Subscapularis 5/5 5/5  AROM/PROM (degrees) FF 0-120 / 0-150 0-130 / 0-160   ER0 0-45/ 0-50 0-45 / 0-50   IR(back) L4 / L2 T10 / T10  Palpation (pain): AC positive positive   Biceps negative negative   Coracoid negative negative  Special Tests: O'Briens positive negative   Cross body Adduction positive positive   Speeds  negative negative   Jobe's Positive for pain, not weakness negative   Neer positive positive   Hawkins positive positive   Belly Press negative negative  Other: Sulcus sign negative negative   Lateral deltoid 5/5  5/5    Vascular/Lymphatic: Fingers warm and well perfused with 2+ radial pulse.    Neurologic: Sensation intact to the Median, Ulnar and Radial nerve distribution of the hand. Sensation intact to lateral deltoid (axillary nerve).        XR bilateral shoulder imaging: X-rays of the bilateral shoulders including AP, Grashey, scapular Y, axillary were obtained in office on 08/23/2024 and reviewed with patient.  Per my interpretation, there are no fractures or dislocations.  Mild bilateral acromioclavicular joint arthritis.  Glenohumeral joint well-maintained bilaterally.  Mild downward sloping acromion bilaterally.   Assessment:  Right shoulder bursitis Left shoulder bursitis  Plan:  Patient was seen and examined in office today. We reviewed patient's history, examination, and imaging in detail. Based on information available for this encounter, patient likely symptomatic from bilateral shoulder bursitis.  She does have fairly well-maintained range of motion however there is pain with endrange motion.  Her strength is well-preserved  in evaluation of her rotator cuff.  Reaching across body and overhead are motions that cause significant discomfort for patient.  X-rays demonstrate downward sloping acromion which may contribute to her symptoms.  We discussed her bilateral shoulder condition and conservative treatment options moving forward.  We believe patient would benefit from formal physical therapy.  She does have difficulty with transportation.  Therefore we have placed a referral for home health in hopes that a therapist can come to her house and assist with modalities including strengthening and stretching to optimize her shoulder health.  We also discussed subacromial injection today.  She does have diabetes mellitus with significant A1c value of 8.1 this month.  Therefore we believe steroid is not the best option.  We also discussed utilizing Toradol  instead to provide anti-inflammatory  relief within the subacromial space.  She would like to proceed with bilateral shoulder injections today.  We will plan on seeing patient back after completion of home health therapy regimen in 6-8 weeks.  Home exercises also provided to patient today.  Patient education material was provided.  All questions, concerns and comments were addressed to the best of my ability.  Follow-up: 6-8 weeks after completion of therapy   Arlyss GEANNIE Schneider, DO Orthopedic Surgery & Sports Medicine [place]   This document was dictated using Dragon voice recognition software. A reasonable attempt at proof reading has been made to minimize errors.  Bilateral shoulder injection procedure: The risks and benefits of a subacromial injection were discussed and the patient wishes to proceed.  The anatomic landmarks of the right shoulder were palpated.  The shoulder was cleansed with alcohol swabs and ChloraPrep.  Using sterile technique, a 21 gauge needle was introduced into the subacromial space utilizing a posterior approach.  After aspiration revealed no blood the injectate which consisted of 1cc of 30 mg/mL of Ketoralac and 4cc of 1% lidocaine  was injected into the area.  The needle was withdrawn and pressure was applied for hemostasis.  A bandage was applied.  The patient tolerated the procedure well and was told to ice the area tonight if it is sore.  This procedure was repeated for the left shoulder.  Large Joint Inj: bilateral subacromial bursa on 08/23/2024 2:41 PM Indications: pain Details: (21 G) 1.5 in needle, posterior approach Medications (Right): 4 mL lidocaine  1 % (30mg /mL Ketorolac  1cc) Medications (Left): 4 mL lidocaine  1 % (30 mg/mL Ketorolac  1 cc) Outcome: tolerated well, no immediate complications Procedure, treatment alternatives, risks and benefits explained, specific risks discussed. Patient was prepped and draped in the usual sterile fashion.

## 2024-09-01 ENCOUNTER — Other Ambulatory Visit: Payer: Medicare PPO | Admitting: Pharmacist

## 2024-09-01 DIAGNOSIS — E119 Type 2 diabetes mellitus without complications: Secondary | ICD-10-CM

## 2024-09-01 NOTE — Telephone Encounter (Signed)
 Received pt portion Boehring (Jardiance )today.

## 2024-09-01 NOTE — Progress Notes (Signed)
 09/01/2024 Name: Melinda [name] MRN: [id number] DOB: 1971/08/28  Chief Complaint  Patient presents with   Medication Assistance    Melinda [name] is a 53 y.o. year old female who presented for a telephone visit.   They were referred to the pharmacist by their PCP for assistance in managing medication access.      Subjective:   Care Team: Primary Care Provider: Myrla Jon CHRISTELLA, MD ; Next Scheduled Visit: 11/16/2024    Medication Access/Adherence  Current Pharmacy:  [facility] - [place], [place] - [address] [place] [address] Phone: [phone] Fax: [phone]   Patient reports affordability concerns with their medications: No  Patient reports access/transportation concerns to their pharmacy: No  Patient reports adherence concerns with their medications:  No       Diabetes:   Current medications:  - Jardiance  25mg  daily - metformin  ER 500mg  twice daily - Mounjaro 2.5 mg weekly on Tuesdays (started 08/24/2024)  Reports tolerating well   Current glucose readings: recalls recent morning fasting readings range 119-160   Statin therapy: atorvastatin  10 mg daily   Current physical activity: reports limited to movement around her home   Current medication access support: enrolled in patient assistance for Jardiance  from [employer] for 2025  - Collaborating with CPhT and PCP for assistance with Jardiance  patient assistance program re-enrollment for 2026  Objective:  Lab Results  Component Value Date   HGBA1C 8.1 (H) 08/12/2024    Lab Results  Component Value Date   CREATININE 0.72 08/12/2024   BUN 13 08/12/2024   NA 140 08/12/2024   K 4.3 08/12/2024   CL 102 08/12/2024   CO2 23 08/12/2024    Lab Results  Component Value Date   CHOL 164 08/12/2024   HDL 42 08/12/2024   LDLCALC 78 08/12/2024   TRIG 267 (H) 08/12/2024   CHOLHDL 2.5 01/16/2023    Current Outpatient Medications on File Prior to  Visit  Medication Sig Dispense Refill   empagliflozin  (JARDIANCE ) 25 MG TABS tablet Take 1 tablet (25 mg total) by mouth daily. 90 tablet 1   metFORMIN  (GLUCOPHAGE -XR) 500 MG 24 hr tablet TAKE 1 TABLET (500 MG TOTAL) IN THE MORNING AND AT BEDTIME. 180 tablet 3   Alcohol Swabs 70 % PADS Use to cleanse the skin prior to mounjaro injection 100 each 3   atorvastatin  (LIPITOR) 10 MG tablet TAKE 1 TABLET EVERY DAY 90 tablet 3   Blood Glucose Monitoring Suppl (TRUE METRIX METER) w/Device KIT Use to check fasting blood sugar daily 1 kit 0   Blood Glucose Monitoring Suppl DEVI 1 each by Does not apply route in the morning, at noon, and at bedtime. May substitute to any manufacturer covered by patient's insurance. 1 each 0   buPROPion  (WELLBUTRIN  XL) 300 MG 24 hr tablet TAKE 1 TABLET EVERY DAY 90 tablet 3   escitalopram  (LEXAPRO ) 20 MG tablet TAKE 1 TABLET EVERY DAY 90 tablet 3   Glucose Blood (BLOOD GLUCOSE TEST STRIPS) STRP TEST BLOOD SUGAR IN THE MORNING, AT NOON, AND AT BEDTIME. 100 strip 0   lisinopril  (ZESTRIL ) 10 MG tablet TAKE 1 TABLET EVERY DAY 90 tablet 3   tirzepatide (MOUNJARO) 2.5 MG/0.5ML Pen Inject 2.5 mg into the skin once a week. 2 mL 0   tirzepatide (MOUNJARO) 5 MG/0.5ML Pen Inject 5 mg into the skin once a week. 6 mL 1   tirzepatide (MOUNJARO) 7.5 MG/0.5ML Pen Inject 7.5 mg into  the skin once a week. 6 mL 1   triamcinolone ointment (KENALOG) 0.5 % Apply 1 Application topically 2 (two) times daily as needed (itching). 30 g 1   TRUE METRIX BLOOD GLUCOSE TEST test strip Check fasting blood sugar once daily 100 strip 3   TRUEplus Lancets 33G MISC USE TO CHECK BLOOD GLUCOSE LEVEL ONE TIME DAILY. 100 each 3   Vitamin D , Ergocalciferol , (DRISDOL ) 1.25 MG (50000 UNIT) CAPS capsule Take 1 capsule (50,000 Units total) by mouth every 7 (seven) days. 12 capsule 0   ziprasidone  (GEODON ) 80 MG capsule TAKE 1 CAPSULE TWICE DAILY 180 capsule 3   No current facility-administered medications on file  prior to visit.       Assessment/Plan:   Encourage patient to start Vitamin D  50,000 units once WEEKLY x 12 weeks as prescribed by PCP for treatment of vitamin D  deficiency  Diabetes: - Reviewed long term cardiovascular and renal outcomes of uncontrolled blood sugar - Reviewed goal A1c, goal fasting, and goal 2 hour post prandial glucose - Have reviewed dietary modifications including importance of having regular well-balanced meals and snacks throughout the day, while controlling carbohydrate portion sizes  Encourage patient to work on spreading her meals out throughout the day  Encourage patient to work on reducing sugary beverages (sweet tea), drinking water instead - Patient to follow up with [facility] C[employer] patient assistance as needed for refills of Jardiance  - Collaborating with CPhT and PCP for assistance with Jardiance  patient assistance program re-enrollment for 2026   Follow Up Plan: Schedule follow up for patient to speak with Clinical Pharmacist Melinda [name] by telephone on 11/17/2024 at 1:00 PM        Melinda [name], PharmD, [facility] [phone]

## 2024-09-01 NOTE — Patient Instructions (Signed)
 Goals Addressed             This Visit's Progress    Pharmacy Goals       If you need to reach out to patient assistance programs regarding refills or to find out the status of your application, you can do so by calling:  [employer] [phone]  Estelle Grumbles, PharmD, [facility] [phone]

## 2024-09-08 ENCOUNTER — Other Ambulatory Visit: Payer: Self-pay | Admitting: Family Medicine

## 2024-09-13 ENCOUNTER — Encounter: Payer: Self-pay | Admitting: Radiology

## 2024-09-18 ENCOUNTER — Other Ambulatory Visit: Payer: Self-pay | Admitting: Family Medicine

## 2024-09-18 DIAGNOSIS — E119 Type 2 diabetes mellitus without complications: Secondary | ICD-10-CM

## 2024-09-20 NOTE — Telephone Encounter (Signed)
 Requested medication (s) are due for refill today:   Requested medication (s) are on the active medication list: Yes  Last refill:  07/07/24  Future visit scheduled:   Notes to clinic:  See request.    Requested Prescriptions  Pending Prescriptions Disp Refills   Blood Glucose Monitoring Suppl (TRUE METRIX METER) w/Device KIT [Pharmacy Med Name: TRUE METRIX SELF MONITORI] 100 kit     Sig: TEST BLOOD SUGAR IN THE MORNING, AT NOON, AND AT BEDTIME.     Endocrinology: Diabetes - Testing Supplies Passed - 09/20/2024  3:17 PM      Passed - Valid encounter within last 12 months    Recent Outpatient Visits           1 month ago Essential hypertension   [facility] [place], Jon HERO, MD   7 months ago Encounter for annual physical exam   [facility], Jon HERO, MD       Future Appointments             In 1 month Gust Molly, DO [facility] [employer] [facility]

## 2024-09-21 ENCOUNTER — Ambulatory Visit: Payer: Self-pay

## 2024-09-21 NOTE — Telephone Encounter (Signed)
 FYI Only or Action Required?: FYI only for provider: appointment scheduled on 09/24/24.  Patient was last seen in primary care on 08/12/2024 by Myrla Jon HERO, MD.  Called Nurse Triage reporting Cold Extremity.  Symptoms began several months ago.  Interventions attempted: Nothing.  Symptoms are: stable.  Triage Disposition: See PCP Within 2 Weeks  Patient/caregiver understands and will follow disposition?: Yes Reason for Disposition  Nursing judgment or information in reference  Answer Assessment - Initial Assessment Questions Denies chest pain or SOB.   1. REASON FOR CALL: What is your main concern right now?     Body is warm to touch, but body feels cold, mainly to feet  2. ONSET: When did the cold feeling start?     Month or two  3. SEVERITY: How bad is the cold feeling?     Can happen at all  4. RELIEVING AND AGGRAVATING FACTORS: What makes it better or worse? (e.g., certain activities, rest)     Even with socks on they still feel cold  5. FEVER: Do you have a fever?     Unsure, does not have thermometer  6. OTHER SYMPTOMS: Do you have any other new symptoms?     Denies pain, swelling, discoloration  Protocols used: No Guideline Available-A-AH  Copied from CRM #[id number]. Topic: Clinical - Medical Advice >> Sep 21, 2024  4:28 PM Avram MATSU wrote: Reason for CRM: patient wants some advice about getting cold chills, her feet will feel cold even though its warm in her house. Pt stated her glucose is is fine.please advise, [phone](temporary number)

## 2024-09-24 ENCOUNTER — Encounter: Payer: Self-pay | Admitting: Family Medicine

## 2024-09-24 ENCOUNTER — Ambulatory Visit: Admitting: Family Medicine

## 2024-09-24 VITALS — BP 104/67 | HR 85 | Resp 16 | Ht 62.0 in | Wt 234.2 lb

## 2024-09-24 DIAGNOSIS — R209 Unspecified disturbances of skin sensation: Secondary | ICD-10-CM

## 2024-09-24 DIAGNOSIS — E1165 Type 2 diabetes mellitus with hyperglycemia: Secondary | ICD-10-CM

## 2024-09-24 DIAGNOSIS — Z7985 Long-term (current) use of injectable non-insulin antidiabetic drugs: Secondary | ICD-10-CM

## 2024-09-24 NOTE — Progress Notes (Signed)
 Acute Office Visit  Introduced to nurse practitioner role and practice setting.  All questions answered.  Discussed provider/patient relationship and expectations.   Subjective:     Patient ID: Melinda [name], female    DOB: 1971-03-31, 53 y.o.   MRN: [id number]  Chief Complaint  Patient presents with   Cold Extremity    Pt reports having cold feet and body ongoing for several months Unsure if related to her DM medication    Discussed the use of AI scribe software for clinical note transcription with the patient, who gave verbal consent to proceed.  History of Present Illness Melinda [name] is a 53 year old female who presents with persistent cold sensation in her feet and body.  She has been experiencing a persistent cold sensation in her feet and body for the past three months. Despite maintaining a home temperature of 73 degrees, she continues to feel cold. Wearing socks does not alleviate the cold sensation in her feet. No changes in skin color, such as turning purple or white, are noted, and the cold sensation can be constant.  She occasionally experiences numbness and decreased sensation in her fingers and feet. There are no wounds on her feet, and she uses a scrubber to remove dead skin without issues. She sometimes experiences pain when walking, which she attributes to wearing medium-width bedroom shoes instead of wide ones. No burning sensation in her feet is reported, but there is occasional crampiness.  Her past medical history includes diabetes, for which she is taking Mounjaro. She reports that her blood sugar levels have gone down since starting this medication. She has changed her diet and reduced her food intake but still feels weak at times. She denies any recent bleeding and has not been taking iron supplements. She is currently taking vitamin D .  No changes in her hair or skin are reported, aside from age-related hair loss. No difficulty breathing, palpitations, or  swelling in her feet. She has previously used gabapentin  for pain management but found it ineffective, and currently uses ibuprofen for pain relief. Her last thyroid  function test was normal about a year ago, and her A1c was 8.1 when she started Mounjaro.   HPI  ROS      Objective:    BP 104/67   Pulse 85   Resp 16   Ht 5' 2 (1.575 m)   Wt 234 lb 3.2 oz (106.2 kg)   LMP  (LMP Unknown) Comment: pt states she has not had period in the past year  SpO2 98%   BMI 42.84 kg/m    Physical Exam Constitutional:      General: She is not in acute distress.    Appearance: Normal appearance. She is obese. She is not ill-appearing, toxic-appearing or diaphoretic.  HENT:     Head: Normocephalic.     Nose: Nose normal.     Mouth/Throat:     Mouth: Mucous membranes are moist.     Pharynx: Oropharynx is clear.  Eyes:     Extraocular Movements: Extraocular movements intact.     Pupils: Pupils are equal, round, and reactive to light.  Cardiovascular:     Rate and Rhythm: Normal rate and regular rhythm.     Pulses:          Radial pulses are 2+ on the right side and 2+ on the left side.       Dorsalis pedis pulses are detected w/ Doppler on the right side and detected w/  Doppler on the left side.       Posterior tibial pulses are detected w/ Doppler on the right side and detected w/ Doppler on the left side.     Heart sounds: Normal heart sounds. No murmur heard.    No friction rub. No gallop.     Comments: Palpable pulses and dopplered Pulmonary:     Effort: Pulmonary effort is normal. No respiratory distress.     Breath sounds: Normal breath sounds. No stridor. No wheezing, rhonchi or rales.  Chest:     Chest wall: No tenderness.  Musculoskeletal:     Right lower leg: No edema.     Left lower leg: No edema.  Skin:    General: Skin is warm and dry.     Capillary Refill: Capillary refill takes less than 2 seconds.  Neurological:     General: No focal deficit present.     Mental  Status: She is alert and oriented to person, place, and time. Mental status is at baseline.     Cranial Nerves: No cranial nerve deficit.     Motor: No weakness.     Gait: Gait normal.  Psychiatric:        Mood and Affect: Mood normal.        Behavior: Behavior normal.        Thought Content: Thought content normal.        Judgment: Judgment normal.    No results found for any visits on 09/24/24.     Assessment & Plan:  Assessment and Plan Assessment & Plan Bilateral Cold Feet Cold sensation in feet and body for at least three months, with occasional numbness and decreased sensation. No color changes or vascular concerns. Differential includes hormonal changes, thyroid  dysfunction, iron deficiency, or diabetic neuropathy. Pulses and capillary refill are normal, indicating good perfusion. No evidence of vascular insufficiency. No edema. - no past hx of vascular disease - Ordered blood count, iron levels, electrolytes, thyroid  function, and B12 levels. - Advised wearing thicker socks and wrap feet in blanket, avoid cold triggers - Will consider iron supplementation if iron deficiency is confirmed. - If all labs normal consider gabapentin  for diabetic neuropathy vs referral to vascular for further assessment  Type 2 diabetes mellitus - Last A1c 8.1% , 08/12/24 - On mounjaro and metformin  currently - cold feet could be due to diabetic neuropathy - used to take gabapentin  for pain - could reconsider restarting, if all labs normal    Problem List Items Addressed This Visit   None Visit Diagnoses       Bilateral cold feet    -  Primary   Relevant Orders   Vitamin B12   TSH + free T4   CBC with Differential/Platelet   Comprehensive metabolic panel with GFR   Iron, TIBC and Ferritin Panel       No orders of the defined types were placed in this encounter.   Return if symptoms worsen or fail to improve.  Curtis DELENA Boom, FNP  I, Curtis DELENA Boom, FNP, have reviewed all  documentation for this visit. The documentation on 09/24/24 for the exam, diagnosis, procedures, and orders are all accurate and complete.

## 2024-09-25 LAB — CBC WITH DIFFERENTIAL/PLATELET
Basophils Absolute: 0 x10E3/uL (ref 0.0–0.2)
Basos: 0 %
EOS (ABSOLUTE): 0.1 x10E3/uL (ref 0.0–0.4)
Eos: 1 %
Hematocrit: 41.1 % (ref 34.0–46.6)
Hemoglobin: 13.4 g/dL (ref 11.1–15.9)
Immature Grans (Abs): 0 x10E3/uL (ref 0.0–0.1)
Immature Granulocytes: 0 %
Lymphocytes Absolute: 2.3 x10E3/uL (ref 0.7–3.1)
Lymphs: 33 %
MCH: 26.3 pg — ABNORMAL LOW (ref 26.6–33.0)
MCHC: 32.6 g/dL (ref 31.5–35.7)
MCV: 81 fL (ref 79–97)
Monocytes Absolute: 0.6 x10E3/uL (ref 0.1–0.9)
Monocytes: 9 %
Neutrophils Absolute: 4.1 x10E3/uL (ref 1.4–7.0)
Neutrophils: 57 %
Platelets: 346 x10E3/uL (ref 150–450)
RBC: 5.1 x10E6/uL (ref 3.77–5.28)
RDW: 12.7 % (ref 11.7–15.4)
WBC: 7.2 x10E3/uL (ref 3.4–10.8)

## 2024-09-25 LAB — COMPREHENSIVE METABOLIC PANEL WITH GFR
ALT: 12 IU/L (ref 0–32)
AST: 14 IU/L (ref 0–40)
Albumin: 4.5 g/dL (ref 3.8–4.9)
Alkaline Phosphatase: 137 IU/L — ABNORMAL HIGH (ref 49–135)
BUN/Creatinine Ratio: 19 (ref 9–23)
BUN: 15 mg/dL (ref 6–24)
Bilirubin Total: 0.2 mg/dL (ref 0.0–1.2)
CO2: 21 mmol/L (ref 20–29)
Calcium: 9.6 mg/dL (ref 8.7–10.2)
Chloride: 103 mmol/L (ref 96–106)
Creatinine, Ser: 0.81 mg/dL (ref 0.57–1.00)
Globulin, Total: 2.8 g/dL (ref 1.5–4.5)
Glucose: 104 mg/dL — ABNORMAL HIGH (ref 70–99)
Potassium: 4.3 mmol/L (ref 3.5–5.2)
Sodium: 139 mmol/L (ref 134–144)
Total Protein: 7.3 g/dL (ref 6.0–8.5)
eGFR: 86 mL/min/1.73 (ref >59)

## 2024-09-25 LAB — TSH+FREE T4
Free T4: 1.15 ng/dL (ref 0.82–1.77)
TSH: 2.92 u[IU]/mL (ref 0.450–4.500)

## 2024-09-25 LAB — VITAMIN B12: Vitamin B-12: 320 pg/mL (ref 232–1245)

## 2024-09-25 LAB — IRON,TIBC AND FERRITIN PANEL
Ferritin: 67 ng/mL (ref 15–150)
Iron Saturation: 10 % — ABNORMAL LOW (ref 15–55)
Iron: 32 ug/dL (ref 27–159)
Total Iron Binding Capacity: 322 ug/dL (ref 250–450)
UIBC: 290 ug/dL (ref 131–425)

## 2024-09-26 ENCOUNTER — Ambulatory Visit: Payer: Self-pay | Admitting: Family Medicine

## 2024-09-30 ENCOUNTER — Telehealth: Payer: Self-pay

## 2024-09-30 NOTE — Telephone Encounter (Signed)
 LVM for pt to cb to see if she started [facility] PT

## 2024-10-20 ENCOUNTER — Other Ambulatory Visit: Payer: Self-pay | Admitting: Family Medicine

## 2024-10-20 NOTE — Telephone Encounter (Signed)
 Faxing provider portion .

## 2024-10-20 NOTE — Telephone Encounter (Signed)
 Please review in provider's absence.  LOV- 09/24/2024 NOV- 11/16/2024 LRF- 12/30/2023 Outpatient Medication Detail   Disp Refills Start End   ziprasidone  (GEODON ) 80 MG capsule 180 capsule 3 12/30/2023 --   Sig - Route: TAKE 1 CAPSULE TWICE DAILY - Oral   Sent to pharmacy as: ziprasidone  (GEODON ) 80 MG capsule   E-Prescribing Status: Receipt confirmed by pharmacy (12/30/2023  5:04 PM EST)   Renewals  Renewal provider: Donzella Lauraine SAILOR, DO

## 2024-10-28 ENCOUNTER — Ambulatory Visit

## 2024-11-01 ENCOUNTER — Telehealth: Payer: Self-pay

## 2024-11-01 NOTE — Telephone Encounter (Signed)
 Copied from CRM #[id number]. Topic: General - Other >> Nov 01, 2024 11:38 AM Sophia H wrote: Reason for CRM: Patient is following up on a request from [employer] - states she spoke with them today regarding here application and they are needing PCP to send in requested info from fax. Do see fax in chart from today - please reach out and advise # [phone]

## 2024-11-01 NOTE — Telephone Encounter (Signed)
 Spoke with pt advised Dr. B is out of the office this week and after her return forms do take about 5-7 days to have forms completed. Pt verbalized understanding.

## 2024-11-02 ENCOUNTER — Other Ambulatory Visit: Payer: Self-pay

## 2024-11-02 NOTE — Progress Notes (Signed)
 Forms received from pharmacy team.

## 2024-11-09 NOTE — Telephone Encounter (Signed)
 Received provider portion Bicares (Jardiance ) faxed to [facility] along pt portion ,Ins,proof of income.

## 2024-11-16 ENCOUNTER — Ambulatory Visit: Admitting: Family Medicine

## 2024-11-16 ENCOUNTER — Telehealth: Payer: Self-pay

## 2024-11-16 NOTE — Telephone Encounter (Signed)
 Copied from CRM [id number]. Topic: Clinical - Medication Question >> Nov 16, 2024  1:34 PM Ahlexyia S wrote: Reason for CRM: Pt called in requesting update on faxing medication empagliflozin  (JARDIANCE ) 25 MG TABS tablet to [facility]. Pt stated that she was denied previously but was told that due to her income she is able to receive medication, her provider just has to fill out a form. Pt is requesting a callback.

## 2024-11-16 NOTE — Telephone Encounter (Signed)
 Call busy. Unable to leave voicemail.  E2C2 - if call is returned okay to advise that her visit tomorrow with the pharmacist is to address this concern so she will be able to further advise then.

## 2024-11-17 ENCOUNTER — Other Ambulatory Visit

## 2024-11-17 DIAGNOSIS — E1165 Type 2 diabetes mellitus with hyperglycemia: Secondary | ICD-10-CM

## 2024-11-17 DIAGNOSIS — Z7985 Long-term (current) use of injectable non-insulin antidiabetic drugs: Secondary | ICD-10-CM

## 2024-11-17 MED ORDER — EMPAGLIFLOZIN 25 MG PO TABS: 25.0000 mg | ORAL_TABLET | Freq: Every day | ORAL | 1 refills | Status: AC

## 2024-11-17 NOTE — Progress Notes (Signed)
 "  S:     Reason for visit: ?  Melinda [name] is a 54 y.o. female with a history of diabetes (type 2), who presents today for a follow up diabetes Telephone pharmacotherapy visit.? Pertinent PMH also includes HTN, HLD, GERD, MDD, and obesity.  They were referred to the pharmacist by their PCP for assistance in managing diabetes.  Care Team: Primary Care Provider: Myrla Jon CHRISTELLA, MD   Current diabetes medications include: Jardiance  25 mg daily, metformin  XR 500 mg BID, Mounjaro  5 mg weekly (Tuesday) Previous diabetes medications include: Januvia, pioglitazone  Current hypertension medications include: lisinopril  10 mg daily Current hyperlipidemia medications include: atorvastatin  10 mg daily   Patient reports adherence to taking all medications as prescribed.   Have you been experiencing any side effects to the medications prescribed? no Do you have any problems obtaining medications due to transportation or finances? no Insurance coverage: [employer] Medicare/Low Income Subsidy  Patient denies hypoglycemic events.  Reported home fasting blood sugars: 80-115 mg/dL  Patient reports neuropathy (nerve pain).  Patient reported dietary habits: reports improved dietary habits since starting Mounjaro . Reports a bit of appetite suppression with Mounjaro   DM Prevention:  Statin: Taking; moderate intensity.?  ACE/ARB: yes; lisinopril  Last urinary albumin/creatinine ratio:  Lab Results  Component Value Date   MICRALBCREAT <5 02/10/2024   MICRALBCREAT 15 01/16/2023   MICRALBCREAT 11 03/21/2022   MICRALBCREAT 2.9 10/06/2018   Last eye exam:  Lab Results  Component Value Date   HMDIABEYEEXA No Retinopathy 06/02/2023   Lab Results  Component Value Date   HMDIABEYEEXA No Retinopathy 06/02/2023   Last foot exam: 02/10/2024 Tobacco Use:  Tobacco Use: Low Risk (09/24/2024)   Patient History    Smoking Tobacco Use: Never    Smokeless Tobacco Use: Never    Passive Exposure: Not  on file   O:  Vitals:  Wt Readings from Last 3 Encounters:  09/24/24 234 lb 3.2 oz (106.2 kg)  08/12/24 241 lb 1.6 oz (109.4 kg)  02/10/24 230 lb 8 oz (104.6 kg)   BP Readings from Last 3 Encounters:  09/24/24 104/67  08/12/24 128/61  02/10/24 122/78   Pulse Readings from Last 3 Encounters:  09/24/24 85  08/12/24 83  02/10/24 85     Labs:?  Lab Results  Component Value Date   HGBA1C 8.1 (H) 08/12/2024   HGBA1C 7.0 (H) 02/10/2024   HGBA1C 7.1 (H) 07/24/2023   GLUCOSE 104 (H) 09/24/2024   MICRALBCREAT <5 02/10/2024   MICRALBCREAT 15 01/16/2023   MICRALBCREAT 11 03/21/2022   CREATININE 0.81 09/24/2024   CREATININE 0.72 08/12/2024   CREATININE 0.71 02/10/2024    Lab Results  Component Value Date   CHOL 164 08/12/2024   LDLCALC 78 08/12/2024   LDLCALC 71 02/10/2024   LDLCALC 54 01/16/2023   HDL 42 08/12/2024   TRIG 267 (H) 08/12/2024   TRIG 72 02/10/2024   TRIG 157 (H) 01/16/2023   ALT 12 09/24/2024   ALT 14 08/12/2024   AST 14 09/24/2024   AST 12 08/12/2024      Chemistry      Component Value Date/Time   NA 139 09/24/2024 1457   NA 139 05/08/2013 1404   K 4.3 09/24/2024 1457   K 3.8 05/08/2013 1404   CL 103 09/24/2024 1457   CL 108 (H) 05/08/2013 1404   CO2 21 09/24/2024 1457   CO2 28 05/08/2013 1404   BUN 15 09/24/2024 1457   BUN 7 05/08/2013 1404   CREATININE 0.81  09/24/2024 1457   CREATININE 0.77 05/08/2013 1404      Component Value Date/Time   CALCIUM  9.6 09/24/2024 1457   CALCIUM  8.8 05/08/2013 1404   ALKPHOS 137 (H) 09/24/2024 1457   ALKPHOS 97 05/08/2013 1404   AST 14 09/24/2024 1457   AST 14 (L) 05/08/2013 1404   ALT 12 09/24/2024 1457   ALT 14 05/08/2013 1404   BILITOT 0.2 09/24/2024 1457   BILITOT 0.3 05/08/2013 1404       The 10-year ASCVD risk score (Arnett DK, et al., 2019) is: 6.1%  Lab Results  Component Value Date   MICRALBCREAT <5 02/10/2024   MICRALBCREAT 15 01/16/2023   MICRALBCREAT 11 03/21/2022   MICRALBCREAT  2.9 10/06/2018    A/P: Diabetes currently uncontrolled with a most recent A1c of 8.1% on 08/12/24, which is up from 7% on 02/10/24. Reported fasting BG readings at goal with a range of 80-115 mg/dL. Patient is able to verbalize appropriate hypoglycemia management plan. Medication adherence appears appropriate. Patient was denied re-enrollment for Jardiance  patient assistance since she has been approved for Medicare Extra Help. Jardiance  should be ~$12 for a 3 month supply on insurance. Patient has 3 doses left of Mounjaro  5 mg and then plans to increase to 7.5 mg weekly after that time.  -Increased dose of GLP-1 Mounjaro  (tirzepatide )  to 7.5 mg weekly after completion of last 3 doses of Mounjaro  5 mg as instructed by PCP -Continued SGLT2-I Jardiance  (empagliflozin ) 25 mg daily. Sent Rx to mail order pharmacy -Continued metformin  XR 500 mg BID.  -Extensively discussed pathophysiology of diabetes, recommended lifestyle interventions, dietary effects on blood sugar control.  -Counseled on s/sx of and management of hypoglycemia.  -Next A1c anticipated 11/2024.   ASCVD risk - primary prevention in patient with diabetes. Last LDL is 78 mg/dL, not at goal of <29 mg/dL.  -Continued atorvastatin  10 mg daily.   Patient verbalized understanding of treatment plan. Total time patient counseling 30 minutes.  Follow-up:  Pharmacist on 04/20/25 PCP clinic visit on 12/02/24  Peyton CHARLENA Ferries, PharmD, CPP Clinical Pharmacist [facility] [phone]   "

## 2024-12-02 ENCOUNTER — Ambulatory Visit: Admitting: Family Medicine

## 2024-12-02 ENCOUNTER — Encounter: Payer: Self-pay | Admitting: Family Medicine

## 2024-12-02 VITALS — BP 128/75 | HR 86 | Resp 14 | Ht 62.0 in | Wt 217.6 lb

## 2024-12-02 DIAGNOSIS — E1169 Type 2 diabetes mellitus with other specified complication: Secondary | ICD-10-CM

## 2024-12-02 DIAGNOSIS — Z7985 Long-term (current) use of injectable non-insulin antidiabetic drugs: Secondary | ICD-10-CM | POA: Diagnosis not present

## 2024-12-02 DIAGNOSIS — Z7984 Long term (current) use of oral hypoglycemic drugs: Secondary | ICD-10-CM

## 2024-12-02 DIAGNOSIS — E66812 Obesity, class 2: Secondary | ICD-10-CM | POA: Diagnosis not present

## 2024-12-02 DIAGNOSIS — H9313 Tinnitus, bilateral: Secondary | ICD-10-CM | POA: Diagnosis not present

## 2024-12-02 DIAGNOSIS — E1159 Type 2 diabetes mellitus with other circulatory complications: Secondary | ICD-10-CM

## 2024-12-02 DIAGNOSIS — Z6841 Body Mass Index (BMI) 40.0 and over, adult: Secondary | ICD-10-CM

## 2024-12-02 DIAGNOSIS — F332 Major depressive disorder, recurrent severe without psychotic features: Secondary | ICD-10-CM

## 2024-12-02 DIAGNOSIS — F3175 Bipolar disorder, in partial remission, most recent episode depressed: Secondary | ICD-10-CM | POA: Diagnosis not present

## 2024-12-02 DIAGNOSIS — E785 Hyperlipidemia, unspecified: Secondary | ICD-10-CM | POA: Diagnosis not present

## 2024-12-02 DIAGNOSIS — Z6839 Body mass index (BMI) 39.0-39.9, adult: Secondary | ICD-10-CM

## 2024-12-02 DIAGNOSIS — I152 Hypertension secondary to endocrine disorders: Secondary | ICD-10-CM | POA: Diagnosis not present

## 2024-12-02 LAB — POCT GLYCOSYLATED HEMOGLOBIN (HGB A1C): Hemoglobin A1C: 6 % — AB (ref 4.0–5.6)

## 2024-12-02 NOTE — Assessment & Plan Note (Addendum)
 BMI now down to 39.8, but assoc with T2DM, HTN, HLD Weight loss achieved through dietary changes, including the use of an air fryer to reduce oil intake. - Continue current dietary modifications

## 2024-12-02 NOTE — Assessment & Plan Note (Signed)
 Assoc with HTN, HLD, neuropathy Blood sugar levels are well-controlled with current medication regimen. Awaiting A1c results to confirm control. - Continue Mounjaro  7.5 mg for diabetes management - Continue Jardiance  25 mg and metformin  500 mg extended release twice daily - Checked A1c via finger stick today

## 2024-12-02 NOTE — Assessment & Plan Note (Signed)
 Bipolar disorder, in partial remission, most recent episode depressed Currently in partial remission with no recent psychiatric follow-up due to logistical issues. - Continue current medication regimen

## 2024-12-02 NOTE — Progress Notes (Addendum)
 "     Established patient visit   Patient: Melinda [name]   DOB: 1971/09/29   54 y.o. Female  MRN: [id number] Visit Date: 12/02/2024  Today's healthcare provider: Jon Eva, MD   Chief Complaint  Patient presents with   Medical Management of Chronic Issues    3 month f/u   Tinnitus    Ongoing for 2 months comes and goes   Subjective    HPI HPI     Medical Management of Chronic Issues    Additional comments: 3 month f/u        Tinnitus    Additional comments: Ongoing for 2 months comes and goes      Last edited by Wilfred Hargis RAMAN, CMA on 12/02/2024  2:12 PM.       Discussed the use of AI scribe software for clinical note transcription with the patient, who gave verbal consent to proceed.  History of Present Illness   Melinda [name] is a 54 year old female who presents with ringing in her ears.  She has had constant ringing in both ears for the past two to three weeks. She is unsure of the cause. Her blood pressure has not been elevated. She has no other associated symptoms.  Her diabetes is managed with Mounjaro  5 mg weekly, Jardiance  25 mg daily, and Metformin  500 mg in the morning and at bedtime. Her blood sugars are well controlled. She takes Lipitor 10 mg daily for cholesterol.  She has received over 450 ECT treatments in the past and feels this has affected her memory. She currently takes Geodon , Wellbutrin , and Lexapro . She is not seeing a psychiatrist since her prior psychiatrist moved and her insurance will not cover travel.  She takes Lisinopril  10 mg daily.         12/02/2024    2:26 PM 08/12/2024   10:21 AM 02/10/2024   10:34 AM 12/17/2023    2:11 PM 12/02/2023   12:10 PM  Depression screen PHQ 2/9  Decreased Interest 1 1 0 0 0  Down, Depressed, Hopeless 1 1 0 0 0  PHQ - 2 Score 2 2 0 0 0  Altered sleeping 1 0 2    Tired, decreased energy 2 2 2     Change in appetite 1 2 1     Feeling bad or failure about yourself  0 1 1    Trouble  concentrating 0 0 0    Moving slowly or fidgety/restless 0 0 0    Suicidal thoughts 0 0 0    PHQ-9 Score 6 7  6      Difficult doing work/chores Not difficult at all Not difficult at all Not difficult at all       Data saved with a previous flowsheet row definition       12/02/2024    2:26 PM 08/12/2024   10:21 AM 02/10/2024   10:34 AM  GAD 7 : Generalized Anxiety Score  Nervous, Anxious, on Edge 0 0  0   Control/stop worrying 1 1  0   Worry too much - different things 0 1  0   Trouble relaxing 2 2  1    Restless 0 0  0   Easily annoyed or irritable 0 1  0   Afraid - awful might happen 0 0  0   Total GAD 7 Score 3 5 1   Anxiety Difficulty Not difficult at all Not difficult at all Not difficult at all     Data  saved with a previous flowsheet row definition     Medications: Show/hide medication list[1]  Review of Systems     Objective    BP 128/75   Pulse 86   Resp 14   Ht 5' 2 (1.575 m)   Wt 217 lb 9.6 oz (98.7 kg)   LMP  (LMP Unknown) Comment: pt states she has not had period in the past year  SpO2 99%   BMI 39.80 kg/m    Physical Exam Vitals reviewed.  Constitutional:      General: She is not in acute distress.    Appearance: Normal appearance. She is well-developed. She is not diaphoretic.  HENT:     Head: Normocephalic and atraumatic.     Right Ear: Tympanic membrane, ear canal and external ear normal.     Left Ear: Tympanic membrane, ear canal and external ear normal.  Eyes:     General: No scleral icterus.    Conjunctiva/sclera: Conjunctivae normal.  Neck:     Thyroid : No thyromegaly.  Cardiovascular:     Rate and Rhythm: Normal rate and regular rhythm.     Heart sounds: Normal heart sounds. No murmur heard. Pulmonary:     Effort: Pulmonary effort is normal. No respiratory distress.     Breath sounds: Normal breath sounds. No wheezing, rhonchi or rales.  Musculoskeletal:     Cervical back: Neck supple.     Right lower leg: No edema.     Left lower  leg: No edema.  Lymphadenopathy:     Cervical: No cervical adenopathy.  Skin:    General: Skin is warm and dry.     Findings: No rash.  Neurological:     Mental Status: She is alert and oriented to person, place, and time. Mental status is at baseline.  Psychiatric:        Mood and Affect: Mood normal.        Behavior: Behavior normal.      Results for orders placed or performed in visit on 12/02/24  POCT HgB A1C  Result Value Ref Range   Hemoglobin A1C 6.0 (A) 4.0 - 5.6 %   HbA1c POC (<> result, manual entry)     HbA1c, POC (prediabetic range)     HbA1c, POC (controlled diabetic range)      Assessment & Plan     Problem List Items Addressed This Visit       Cardiovascular and Mediastinum   Hypertension associated with diabetes (HCC)   Blood pressure is well-controlled with current medication regimen. - Continue lisinopril  10 mg daily        Endocrine   T2DM (type 2 diabetes mellitus) (HCC)   Assoc with HTN, HLD, neuropathy Blood sugar levels are well-controlled with current medication regimen. Awaiting A1c results to confirm control. - Continue Mounjaro  7.5 mg for diabetes management - Continue Jardiance  25 mg and metformin  500 mg extended release twice daily - Checked A1c via finger stick today      Relevant Orders   POCT HgB A1C (Completed)   Hyperlipidemia associated with type 2 diabetes mellitus (HCC)   Cholesterol levels were checked in October and are well-managed. - Continue Lipitor 10 mg daily        Other   Morbid obesity with BMI of 40.0-44.9, adult (HCC) (Chronic)   BMI now down to 39.8, but assoc with T2DM, HTN, HLD Weight loss achieved through dietary changes, including the use of an air fryer to reduce oil intake. - Continue current  dietary modifications      Major depressive disorder, recurrent severe without psychotic features (HCC) - Primary   Currently managed with Geodon , Wellbutrin , and Lexapro . No recent ECT due to logistical issues.  Declined further ECT and is not interested in TMS at this time. Does not want referral to Psych as she is feeling stable currently - Continue Geodon , Wellbutrin , and Lexapro       Affective bipolar disorder (HCC)   Bipolar disorder, in partial remission, most recent episode depressed Currently in partial remission with no recent psychiatric follow-up due to logistical issues. - Continue current medication regimen      Other Visit Diagnoses       Body mass index (BMI) 40.0-44.9, adult (HCC)         Tinnitus of both ears       Relevant Orders   Ambulatory referral to ENT           Tinnitus, bilateral Bilateral tinnitus present for 2-3 weeks. No visible abnormalities in the ears.  - Referred to ENT for further evaluation of tinnitus        Return in about 3 months (around 03/02/2025) for CPE.       Jon Eva, MD  [facility] [phone] (phone) [phone] (fax)  [facility]      [1]  Outpatient Medications Prior to Visit  Medication Sig   Alcohol  Swabs  70 % PADS Use to cleanse the skin prior to mounjaro  injection   atorvastatin  (LIPITOR) 10 MG tablet TAKE 1 TABLET EVERY DAY   Blood Glucose Monitoring Suppl (TRUE METRIX METER) w/Device KIT TEST BLOOD SUGAR IN THE MORNING, AT NOON, AND AT BEDTIME.   Blood Glucose Monitoring Suppl DEVI 1 each by Does not apply route in the morning, at noon, and at bedtime. May substitute to any manufacturer covered by patient's insurance.   buPROPion  (WELLBUTRIN  XL) 300 MG 24 hr tablet TAKE 1 TABLET EVERY DAY   empagliflozin  (JARDIANCE ) 25 MG TABS tablet Take 1 tablet (25 mg total) by mouth daily.   escitalopram  (LEXAPRO ) 20 MG tablet TAKE 1 TABLET EVERY DAY   Glucose Blood (BLOOD GLUCOSE TEST STRIPS) STRP TEST BLOOD SUGAR IN THE MORNING, AT NOON, AND AT BEDTIME.   lisinopril  (ZESTRIL ) 10 MG tablet TAKE 1 TABLET EVERY DAY   metFORMIN  (GLUCOPHAGE -XR) 500 MG 24 hr tablet TAKE 1 TABLET  (500 MG TOTAL) IN THE MORNING AND AT BEDTIME.   tirzepatide  (MOUNJARO ) 7.5 MG/0.5ML Pen Inject 7.5 mg into the skin once a week.   TRUE METRIX BLOOD GLUCOSE TEST test strip Check fasting blood sugar once daily   TRUEplus Lancets 33G MISC USE TO CHECK BLOOD GLUCOSE LEVEL ONE TIME DAILY.   Vitamin D , Ergocalciferol , (DRISDOL ) 1.25 MG (50000 UNIT) CAPS capsule Take 1 capsule (50,000 Units total) by mouth every 7 (seven) days.   ziprasidone  (GEODON ) 80 MG capsule TAKE 1 CAPSULE TWICE DAILY   [DISCONTINUED] tirzepatide  (MOUNJARO ) 5 MG/0.5ML Pen Inject 5 mg into the skin once a week.   No facility-administered medications prior to visit.   "

## 2024-12-02 NOTE — Assessment & Plan Note (Signed)
 Blood pressure is well-controlled with current medication regimen. - Continue lisinopril 10 mg daily

## 2024-12-02 NOTE — Assessment & Plan Note (Signed)
 Cholesterol levels were checked in October and are well-managed. - Continue Lipitor 10 mg daily

## 2024-12-02 NOTE — Assessment & Plan Note (Signed)
 Currently managed with Geodon , Wellbutrin , and Lexapro . No recent ECT due to logistical issues. Declined further ECT and is not interested in TMS at this time. Does not want referral to Psych as she is feeling stable currently - Continue Geodon , Wellbutrin , and Lexapro 

## 2024-12-07 ENCOUNTER — Encounter: Payer: Self-pay | Admitting: Family Medicine

## 2024-12-07 ENCOUNTER — Other Ambulatory Visit: Payer: Self-pay

## 2024-12-07 DIAGNOSIS — E559 Vitamin D deficiency, unspecified: Secondary | ICD-10-CM

## 2024-12-07 MED ORDER — VITAMIN D (ERGOCALCIFEROL) 1.25 MG (50000 UNIT) PO CAPS: 50000.0000 [IU] | ORAL_CAPSULE | ORAL | 1 refills | Status: AC

## 2024-12-07 NOTE — Telephone Encounter (Signed)
 She should keep taking it. Please send refill for #12 r1

## 2024-12-22 ENCOUNTER — Ambulatory Visit: Payer: Medicare PPO

## 2025-03-15 ENCOUNTER — Encounter: Admitting: Family Medicine

## 2025-04-20 ENCOUNTER — Other Ambulatory Visit
